# Patient Record
Sex: Female | Born: 1955 | Race: White | Hispanic: Yes | Marital: Married | Smoking: Never smoker
Health system: Southern US, Community
[De-identification: ages and names within clinical notes are randomized; demographics above are authoritative.]

## PROBLEM LIST (undated history)

## (undated) DIAGNOSIS — I10 Essential (primary) hypertension: Secondary | ICD-10-CM

## (undated) DIAGNOSIS — I639 Cerebral infarction, unspecified: Secondary | ICD-10-CM

## (undated) DIAGNOSIS — E785 Hyperlipidemia, unspecified: Secondary | ICD-10-CM

## (undated) DIAGNOSIS — C50912 Malignant neoplasm of unspecified site of left female breast: Secondary | ICD-10-CM

## (undated) DIAGNOSIS — S82142A Displaced bicondylar fracture of left tibia, initial encounter for closed fracture: Secondary | ICD-10-CM

## (undated) DIAGNOSIS — C801 Malignant (primary) neoplasm, unspecified: Secondary | ICD-10-CM

## (undated) DIAGNOSIS — E119 Type 2 diabetes mellitus without complications: Secondary | ICD-10-CM

## (undated) HISTORY — DX: Cerebral infarction, unspecified: I63.9

## (undated) HISTORY — DX: Hyperlipidemia, unspecified: E78.5

## (undated) HISTORY — DX: Malignant neoplasm of unspecified site of left female breast: C50.912

---

## 2003-09-06 ENCOUNTER — Emergency Department (HOSPITAL_COMMUNITY): Admission: EM | Admit: 2003-09-06 | Discharge: 2003-09-06 | Payer: Self-pay | Admitting: Emergency Medicine

## 2003-11-04 ENCOUNTER — Ambulatory Visit: Payer: Self-pay | Admitting: Internal Medicine

## 2005-02-14 ENCOUNTER — Emergency Department (HOSPITAL_COMMUNITY): Admission: EM | Admit: 2005-02-14 | Discharge: 2005-02-14 | Payer: Self-pay | Admitting: *Deleted

## 2005-02-15 ENCOUNTER — Ambulatory Visit: Payer: Self-pay | Admitting: Internal Medicine

## 2005-02-16 ENCOUNTER — Ambulatory Visit: Payer: Self-pay | Admitting: *Deleted

## 2005-02-16 ENCOUNTER — Emergency Department (HOSPITAL_COMMUNITY): Admission: EM | Admit: 2005-02-16 | Discharge: 2005-02-16 | Payer: Self-pay | Admitting: Emergency Medicine

## 2005-03-15 ENCOUNTER — Ambulatory Visit: Payer: Self-pay | Admitting: Internal Medicine

## 2005-04-13 ENCOUNTER — Ambulatory Visit: Payer: Self-pay | Admitting: Internal Medicine

## 2005-04-13 ENCOUNTER — Encounter: Payer: Self-pay | Admitting: Family Medicine

## 2005-05-11 ENCOUNTER — Encounter: Admission: RE | Admit: 2005-05-11 | Discharge: 2005-05-11 | Payer: Self-pay | Admitting: Family Medicine

## 2005-05-18 ENCOUNTER — Ambulatory Visit: Payer: Self-pay | Admitting: Internal Medicine

## 2008-04-22 ENCOUNTER — Emergency Department (HOSPITAL_COMMUNITY): Admission: EM | Admit: 2008-04-22 | Discharge: 2008-04-22 | Payer: Self-pay | Admitting: Emergency Medicine

## 2008-05-14 ENCOUNTER — Encounter: Admission: RE | Admit: 2008-05-14 | Discharge: 2008-05-14 | Payer: Self-pay | Admitting: Orthopedic Surgery

## 2008-06-04 ENCOUNTER — Encounter: Admission: RE | Admit: 2008-06-04 | Discharge: 2008-07-24 | Payer: Self-pay | Admitting: Orthopedic Surgery

## 2009-08-11 ENCOUNTER — Emergency Department (HOSPITAL_COMMUNITY): Admission: EM | Admit: 2009-08-11 | Discharge: 2009-08-11 | Payer: Self-pay | Admitting: Family Medicine

## 2009-12-19 ENCOUNTER — Emergency Department (HOSPITAL_COMMUNITY)
Admission: EM | Admit: 2009-12-19 | Discharge: 2009-12-19 | Payer: Self-pay | Source: Home / Self Care | Admitting: Emergency Medicine

## 2010-04-10 LAB — DIFFERENTIAL
Basophils Absolute: 0.1 10*3/uL (ref 0.0–0.1)
Basophils Relative: 1 % (ref 0–1)
Eosinophils Absolute: 0.9 10*3/uL — ABNORMAL HIGH (ref 0.0–0.7)
Eosinophils Relative: 9 % — ABNORMAL HIGH (ref 0–5)
Lymphocytes Relative: 40 % (ref 12–46)
Lymphs Abs: 4.3 10*3/uL — ABNORMAL HIGH (ref 0.7–4.0)
Monocytes Relative: 6 % (ref 3–12)
Neutro Abs: 4.7 10*3/uL (ref 1.7–7.7)

## 2010-04-10 LAB — POCT URINALYSIS DIP (DEVICE)
Ketones, ur: NEGATIVE mg/dL
Protein, ur: NEGATIVE mg/dL
Specific Gravity, Urine: <=1.005 (ref 1.005–1.030)
Urobilinogen, UA: 0.2 mg/dL (ref 0.0–1.0)

## 2010-04-10 LAB — CBC
MCHC: 34.6 g/dL (ref 30.0–36.0)
MCV: 91.7 fL (ref 78.0–100.0)

## 2010-04-10 LAB — GC/CHLAMYDIA PROBE AMP, GENITAL: GC Probe Amp, Genital: NEGATIVE

## 2010-04-10 LAB — WET PREP, GENITAL

## 2010-07-10 ENCOUNTER — Emergency Department (HOSPITAL_COMMUNITY): Payer: Self-pay

## 2010-07-10 ENCOUNTER — Inpatient Hospital Stay (INDEPENDENT_AMBULATORY_CARE_PROVIDER_SITE_OTHER)
Admission: RE | Admit: 2010-07-10 | Discharge: 2010-07-10 | Disposition: A | Discharge status: Home / Self Care | Payer: Self-pay | Source: Ambulatory Visit | Attending: Family Medicine | Admitting: Family Medicine

## 2010-07-10 ENCOUNTER — Emergency Department (HOSPITAL_COMMUNITY)
Admission: EM | Admit: 2010-07-10 | Discharge: 2010-07-10 | Disposition: A | Discharge status: Home / Self Care | Payer: Self-pay | Attending: Emergency Medicine | Admitting: Emergency Medicine

## 2010-07-10 DIAGNOSIS — I6789 Other cerebrovascular disease: Secondary | ICD-10-CM

## 2010-07-10 DIAGNOSIS — R079 Chest pain, unspecified: Secondary | ICD-10-CM | POA: Insufficient documentation

## 2010-07-10 DIAGNOSIS — I10 Essential (primary) hypertension: Secondary | ICD-10-CM | POA: Insufficient documentation

## 2010-07-10 DIAGNOSIS — R209 Unspecified disturbances of skin sensation: Secondary | ICD-10-CM | POA: Insufficient documentation

## 2010-07-10 DIAGNOSIS — M542 Cervicalgia: Secondary | ICD-10-CM | POA: Insufficient documentation

## 2010-07-10 LAB — DIFFERENTIAL
Basophils Absolute: 0 10*3/uL (ref 0.0–0.1)
Basophils Relative: 0 % (ref 0–1)
Eosinophils Absolute: 0.8 10*3/uL — ABNORMAL HIGH (ref 0.0–0.7)
Eosinophils Relative: 9 % — ABNORMAL HIGH (ref 0–5)
Lymphocytes Relative: 39 % (ref 12–46)
Lymphs Abs: 3.7 10*3/uL (ref 0.7–4.0)
Monocytes Absolute: 0.6 10*3/uL (ref 0.1–1.0)
Neutrophils Relative %: 46 % (ref 43–77)

## 2010-07-10 LAB — BASIC METABOLIC PANEL
BUN: 13 mg/dL (ref 6–23)
CO2: 27 mEq/L (ref 19–32)
Calcium: 9.4 mg/dL (ref 8.4–10.5)
Glucose, Bld: 158 mg/dL — ABNORMAL HIGH (ref 70–99)
Sodium: 137 mEq/L (ref 135–145)

## 2010-07-10 LAB — TROPONIN I: Troponin I: <0.3 ng/mL (ref <0.30)

## 2010-07-10 LAB — CBC: Hemoglobin: 15.1 g/dL — ABNORMAL HIGH (ref 12.0–15.0)

## 2012-05-03 ENCOUNTER — Emergency Department (HOSPITAL_COMMUNITY)
Admission: EM | Admit: 2012-05-03 | Discharge: 2012-05-03 | Disposition: A | Discharge status: Home / Self Care | Payer: Self-pay | Attending: Emergency Medicine | Admitting: Emergency Medicine

## 2012-05-03 ENCOUNTER — Emergency Department (HOSPITAL_COMMUNITY): Payer: Self-pay

## 2012-05-03 ENCOUNTER — Encounter (HOSPITAL_COMMUNITY): Payer: Self-pay | Admitting: Emergency Medicine

## 2012-05-03 DIAGNOSIS — B9689 Other specified bacterial agents as the cause of diseases classified elsewhere: Secondary | ICD-10-CM

## 2012-05-03 DIAGNOSIS — Z3202 Encounter for pregnancy test, result negative: Secondary | ICD-10-CM | POA: Insufficient documentation

## 2012-05-03 DIAGNOSIS — N760 Acute vaginitis: Secondary | ICD-10-CM | POA: Insufficient documentation

## 2012-05-03 DIAGNOSIS — M545 Low back pain, unspecified: Secondary | ICD-10-CM | POA: Insufficient documentation

## 2012-05-03 DIAGNOSIS — K5900 Constipation, unspecified: Secondary | ICD-10-CM | POA: Insufficient documentation

## 2012-05-03 DIAGNOSIS — R109 Unspecified abdominal pain: Secondary | ICD-10-CM | POA: Insufficient documentation

## 2012-05-03 LAB — URINALYSIS, MICROSCOPIC ONLY
Bilirubin Urine: NEGATIVE
Glucose, UA: NEGATIVE mg/dL
Hgb urine dipstick: NEGATIVE
Specific Gravity, Urine: 1.021 (ref 1.005–1.030)
Urobilinogen, UA: 0.2 mg/dL (ref 0.0–1.0)

## 2012-05-03 LAB — COMPREHENSIVE METABOLIC PANEL
AST: 30 U/L (ref 0–37)
CO2: 27 mEq/L (ref 19–32)
Calcium: 9.5 mg/dL (ref 8.4–10.5)
Chloride: 98 mEq/L (ref 96–112)
GFR calc Af Amer: >90 mL/min (ref >90)
GFR calc non Af Amer: >90 mL/min (ref >90)
Potassium: 3.7 mEq/L (ref 3.5–5.1)
Sodium: 135 mEq/L (ref 135–145)
Total Protein: 8.4 g/dL — ABNORMAL HIGH (ref 6.0–8.3)

## 2012-05-03 LAB — CBC WITH DIFFERENTIAL/PLATELET
Basophils Absolute: 0 10*3/uL (ref 0.0–0.1)
Basophils Relative: 1 % (ref 0–1)
HCT: 41.5 % (ref 36.0–46.0)
Lymphocytes Relative: 41 % (ref 12–46)
Lymphs Abs: 3.4 10*3/uL (ref 0.7–4.0)
MCH: 31.4 pg (ref 26.0–34.0)
MCV: 87 fL (ref 78.0–100.0)
Monocytes Absolute: 0.4 10*3/uL (ref 0.1–1.0)
Monocytes Relative: 5 % (ref 3–12)
Neutrophils Relative %: 43 % (ref 43–77)

## 2012-05-03 MED ORDER — METRONIDAZOLE 500 MG PO TABS: 500.0000 mg | ORAL_TABLET | Freq: Two times a day (BID) | ORAL | Status: DC

## 2012-05-03 MED ORDER — POLYETHYLENE GLYCOL 3350 17 GM/SCOOP PO POWD: 17.0000 g | Freq: Every day | ORAL | Status: DC

## 2012-05-03 NOTE — ED Notes (Signed)
Patient laying on stretcher at this time. States she is having lower abdominal pain. Plan of care discussed with patient, awaiting results from MD. Patient is in no acute distress, resp are even and unlabored. Call light at bed. Will continue to monitor.

## 2012-05-03 NOTE — ED Notes (Signed)
Patient given discharge instructions for abdominal pain, constipation and bacterial vaginosis. rx for flagyl. Advised to follow up with primary care or to return to this department if condition worsens. Patient voiced understanding. Ambulated to front lobby.

## 2012-05-03 NOTE — ED Notes (Signed)
Pt c/o lower back pain and lower abd pain x long time; pt denies bleeding; pt sts since having sx to prevent pregnancy in [place]

## 2012-05-03 NOTE — ED Provider Notes (Signed)
History     CSN: [id number]  Arrival date & time 05/03/12  1304   First MD Initiated Contact with Patient 05/03/12 1638      Chief Complaint  Patient presents with  . Back Pain  . Abdominal Pain    (Consider location/radiation/quality/duration/timing/severity/associated sxs/prior treatment) Patient is a 57 y.o. female presenting with back pain and abdominal pain. The history is provided by the patient.  Back Pain Associated symptoms: abdominal pain   Associated symptoms: no chest pain, no headaches, no numbness and no weakness   Abdominal Pain Associated symptoms: no chest pain, no diarrhea, no nausea, no shortness of breath and no vomiting    a dull lower abdominal pain since yesterday. She states is constant. Is similar to pain she 12 years ago. No nausea vomiting diarrhea. No constipation. No fevers. No vaginal bleeding or discharge. She states she's had surgery previously so she would not have any more children. She's not had pains like this recently. No decreased appetite. No dysuria. She states the pain goes through to her back.  History reviewed. No pertinent past medical history.  History reviewed. No pertinent past surgical history.  History reviewed. No pertinent family history.  History  Substance Use Topics  . Smoking status: Never Smoker   . Smokeless tobacco: Not on file  . Alcohol Use: No    OB History   Grav Para Term Preterm Abortions TAB SAB Ect Mult Living                  Review of Systems  Constitutional: Negative for activity change and appetite change.  HENT: Negative for neck stiffness.   Eyes: Negative for pain.  Respiratory: Negative for chest tightness and shortness of breath.   Cardiovascular: Negative for chest pain and leg swelling.  Gastrointestinal: Positive for abdominal pain. Negative for nausea, vomiting and diarrhea.  Genitourinary: Negative for flank pain.  Musculoskeletal: Positive for back pain.  Skin: Negative for rash.   Neurological: Negative for weakness, numbness and headaches.  Psychiatric/Behavioral: Negative for behavioral problems.    Allergies  Review of patient's allergies indicates no known allergies.  Home Medications   Current Outpatient Rx  Name  Route  Sig  Dispense  Refill  . metroNIDAZOLE (FLAGYL) 500 MG tablet   Oral   Take 1 tablet (500 mg total) by mouth 2 (two) times daily.   14 tablet   0   . polyethylene glycol powder (GLYCOLAX/MIRALAX) powder   Oral   Take 17 g by mouth daily.   255 g   0     BP 145/76  Pulse 60  Temp(Src) 98 F (36.7 C) (Oral)  Resp 16  SpO2 96%  Physical Exam  Nursing note and vitals reviewed. Constitutional: She is oriented to person, place, and time. She appears well-developed and well-nourished.  HENT:  Head: Normocephalic and atraumatic.  Eyes: EOM are normal. Pupils are equal, round, and reactive to light.  Neck: Normal range of motion. Neck supple.  Cardiovascular: Normal rate, regular rhythm and normal heart sounds.   No murmur heard. Pulmonary/Chest: Effort normal and breath sounds normal. No respiratory distress. She has no wheezes. She has no rales.  Abdominal: Soft. Bowel sounds are normal. She exhibits no distension. There is no tenderness. There is no rebound and no guarding.  Genitourinary:  No CVA tenderness  Musculoskeletal: Normal range of motion.  Neurological: She is alert and oriented to person, place, and time. No cranial nerve deficit.  Skin: Skin is  warm and dry.  Psychiatric: She has a normal mood and affect. Her speech is normal.    ED Course  Procedures (including critical care time)  Labs Reviewed  WET PREP, GENITAL - Abnormal; Notable for the following:    Clue Cells Wet Prep HPF POC MODERATE (*)    WBC, Wet Prep HPF POC MANY (*)    All other components within normal limits  COMPREHENSIVE METABOLIC PANEL - Abnormal; Notable for the following:    Glucose, Bld 206 (*)    Creatinine, Ser 0.45 (*)     Total Protein 8.4 (*)    All other components within normal limits  CBC WITH DIFFERENTIAL - Abnormal; Notable for the following:    MCHC 36.1 (*)    Eosinophils Relative 11 (*)    Eosinophils Absolute 0.9 (*)    All other components within normal limits  URINALYSIS, MICROSCOPIC ONLY - Abnormal; Notable for the following:    APPearance CLOUDY (*)    Leukocytes, UA SMALL (*)    Squamous Epithelial / LPF MANY (*)    All other components within normal limits  GC/CHLAMYDIA PROBE AMP  POCT PREGNANCY, URINE   Dg Abd 2 Views  05/03/2012  *RADIOLOGY REPORT*  Clinical Data: Abdominal pain  ABDOMEN - 2 VIEW  Comparison: None.  Findings: No free air beneath the diaphragms.  No gas filled dilated loop of bowel.  Moderate stool volume throughout nondilated colon.  No acute osseous finding.  Visualized lung bases are clear.  IMPRESSION: Nonobstructive bowel gas pattern.  Moderate stool burden.   Original Report Authenticated By: Christiana Pellant, M.D.      1. Abdominal pain   2. Constipation   3. Bacterial vaginosis       MDM  Patient with abdominal pain. Has been crampy. Bacterial vaginosis on wet prep. X-ray shows some constipation. Normal white count. Patient be discharged with treatment for constipation bacterial vaginosis. Doubt severe intra-abdominal pathology. Patient will followup in the ER in one to 2 days if pain continues.        Juliet Rude. Rubin Payor, MD 05/04/12 [phone]

## 2012-05-04 LAB — GC/CHLAMYDIA PROBE AMP: CT Probe RNA: NEGATIVE

## 2012-11-15 ENCOUNTER — Other Ambulatory Visit: Payer: Self-pay

## 2012-11-15 ENCOUNTER — Other Ambulatory Visit: Payer: Self-pay | Admitting: Internal Medicine

## 2012-11-15 DIAGNOSIS — Z1231 Encounter for screening mammogram for malignant neoplasm of breast: Secondary | ICD-10-CM

## 2012-12-18 ENCOUNTER — Ambulatory Visit
Admission: RE | Admit: 2012-12-18 | Discharge: 2012-12-18 | Disposition: A | Discharge status: Home / Self Care | Payer: Managed Care, Other (non HMO) | Source: Ambulatory Visit | Attending: Internal Medicine | Admitting: Internal Medicine

## 2012-12-18 DIAGNOSIS — Z1231 Encounter for screening mammogram for malignant neoplasm of breast: Secondary | ICD-10-CM

## 2012-12-24 ENCOUNTER — Ambulatory Visit: Payer: Self-pay | Admitting: *Deleted

## 2014-02-03 ENCOUNTER — Emergency Department (HOSPITAL_COMMUNITY)
Admission: EM | Admit: 2014-02-03 | Discharge: 2014-02-03 | Disposition: A | Discharge status: Home / Self Care | Payer: Managed Care, Other (non HMO) | Attending: Emergency Medicine | Admitting: Emergency Medicine

## 2014-02-03 ENCOUNTER — Encounter (HOSPITAL_COMMUNITY): Payer: Self-pay | Admitting: Emergency Medicine

## 2014-02-03 DIAGNOSIS — M546 Pain in thoracic spine: Secondary | ICD-10-CM | POA: Insufficient documentation

## 2014-02-03 DIAGNOSIS — I10 Essential (primary) hypertension: Secondary | ICD-10-CM | POA: Diagnosis not present

## 2014-02-03 DIAGNOSIS — R202 Paresthesia of skin: Secondary | ICD-10-CM | POA: Insufficient documentation

## 2014-02-03 DIAGNOSIS — Z79899 Other long term (current) drug therapy: Secondary | ICD-10-CM | POA: Diagnosis not present

## 2014-02-03 DIAGNOSIS — R200 Anesthesia of skin: Secondary | ICD-10-CM

## 2014-02-03 DIAGNOSIS — E119 Type 2 diabetes mellitus without complications: Secondary | ICD-10-CM | POA: Insufficient documentation

## 2014-02-03 DIAGNOSIS — Z792 Long term (current) use of antibiotics: Secondary | ICD-10-CM | POA: Diagnosis not present

## 2014-02-03 HISTORY — DX: Type 2 diabetes mellitus without complications: E11.9

## 2014-02-03 HISTORY — DX: Essential (primary) hypertension: I10

## 2014-02-03 LAB — CBC WITH DIFFERENTIAL/PLATELET
Basophils Absolute: 0 10*3/uL (ref 0.0–0.1)
Basophils Relative: 0 % (ref 0–1)
EOS ABS: 1.5 10*3/uL — AB (ref 0.0–0.7)
Eosinophils Relative: 14 % — ABNORMAL HIGH (ref 0–5)
HCT: 40.1 % (ref 36.0–46.0)
Hemoglobin: 13.9 g/dL (ref 12.0–15.0)
Lymphocytes Relative: 35 % (ref 12–46)
Lymphs Abs: 3.6 10*3/uL (ref 0.7–4.0)
MCH: 30.5 pg (ref 26.0–34.0)
MCHC: 34.7 g/dL (ref 30.0–36.0)
MCV: 87.9 fL (ref 78.0–100.0)
Monocytes Absolute: 0.6 10*3/uL (ref 0.1–1.0)
Monocytes Relative: 5 % (ref 3–12)
NEUTROS ABS: 4.6 10*3/uL (ref 1.7–7.7)
NEUTROS PCT: 46 % (ref 43–77)
PLATELETS: 229 10*3/uL (ref 150–400)
RBC: 4.56 MIL/uL (ref 3.87–5.11)
RDW: 12.4 % (ref 11.5–15.5)
WBC: 10.3 10*3/uL (ref 4.0–10.5)

## 2014-02-03 LAB — CBG MONITORING, ED: Glucose-Capillary: 113 mg/dL — ABNORMAL HIGH (ref 70–99)

## 2014-02-03 LAB — BASIC METABOLIC PANEL
ANION GAP: 11 (ref 5–15)
BUN: 11 mg/dL (ref 6–23)
CO2: 23 mmol/L (ref 19–32)
Calcium: 9 mg/dL (ref 8.4–10.5)
Chloride: 102 mEq/L (ref 96–112)
Creatinine, Ser: 0.58 mg/dL (ref 0.50–1.10)
GFR calc non Af Amer: >90 mL/min (ref >90)
Glucose, Bld: 109 mg/dL — ABNORMAL HIGH (ref 70–99)
Potassium: 3.6 mmol/L (ref 3.5–5.1)
Sodium: 136 mmol/L (ref 135–145)

## 2014-02-03 MED ORDER — KETOROLAC TROMETHAMINE 60 MG/2ML IM SOLN
60.0000 mg | Freq: Once | INTRAMUSCULAR | Status: AC
  Administered 2014-02-03: 60 mg via INTRAMUSCULAR
  Filled 2014-02-03: qty 2

## 2014-02-03 NOTE — ED Notes (Signed)
Pt complaining of left sided hand and leg numbness. Pt. Neuro and vascular assessment WDL.

## 2014-02-03 NOTE — ED Notes (Signed)
Pt a/o x 4 on d/c with family. Pt refused wheelchair.

## 2014-02-03 NOTE — ED Notes (Signed)
Pt c/o lower back pain with numbness in back and legs and arms; pt sts eye twitching x 2 weeks

## 2014-02-03 NOTE — ED Provider Notes (Signed)
CSN: [id number]     Arrival date & time 02/03/14  1344 History   First MD Initiated Contact with Patient 02/03/14 1732     Chief Complaint  Patient presents with  . Back Pain     (Consider location/radiation/quality/duration/timing/severity/associated sxs/prior Treatment) Patient is a 59 y.o. female presenting with back pain.  Back Pain Location:  Thoracic spine Quality:  Aching Pain severity:  Mild Worse during: worse with bending neck. Duration: first happened 5 yrs ago, has not happened for long time until last week. Timing:  Constant Progression:  Worsening (was getting worse prior to receiving medicine from PCP last week) Chronicity:  New Context: physical stress (works in [employer], working long hours)   Context: not recent illness and not recent injury   Relieved by: received shot in arm from PCP last week which has helped. Worsened by:  Touching and palpation Associated symptoms: numbness (left fingers, left lower leg below the knee) and paresthesias (left fingers, left leg below the knee.  "feels like my arm when a blood pressure cuff blowing up on arm"  leg feels "cold")   Associated symptoms: no abdominal pain, no bladder incontinence, no bowel incontinence, no chest pain, no dysuria, no fever and no headaches   Risk factors: no recent surgery     Past Medical History  Diagnosis Date  . Hypertension   . Diabetes mellitus without complication    History reviewed. No pertinent past surgical history. History reviewed. No pertinent family history. History  Substance Use Topics  . Smoking status: Never Smoker   . Smokeless tobacco: Not on file  . Alcohol Use: No   OB History    No data available     Review of Systems  Constitutional: Negative for fever.  HENT: Negative for sore throat.   Eyes: Negative for visual disturbance.  Respiratory: Negative for cough and shortness of breath.   Cardiovascular: Negative for chest pain.  Gastrointestinal:  Negative for nausea, vomiting, abdominal pain, constipation and bowel incontinence.  Genitourinary: Negative for bladder incontinence, dysuria and difficulty urinating.  Musculoskeletal: Positive for back pain. Negative for neck pain.  Skin: Negative for rash.  Neurological: Positive for numbness (left fingers, left lower leg below the knee) and paresthesias (left fingers, left leg below the knee.  "feels like my arm when a blood pressure cuff blowing up on arm"  leg feels "cold"). Negative for syncope and headaches.      Allergies  Review of patient's allergies indicates no known allergies.  Home Medications   Prior to Admission medications   Medication Sig Start Date End Date Taking? Authorizing Provider  metroNIDAZOLE (FLAGYL) 500 MG tablet Take 1 tablet (500 mg total) by mouth 2 (two) times daily. 05/03/12   Jasper Riling. Pickering, MD  polyethylene glycol powder (GLYCOLAX/MIRALAX) powder Take 17 g by mouth daily. 05/03/12   Jasper Riling. Pickering, MD   BP 115/51 mmHg  Pulse 57  Temp(Src) 97.6 F (36.4 C) (Oral)  Resp 14  Wt 130 lb (58.968 kg)  SpO2 96% Physical Exam  Constitutional: She is oriented to person, place, and time. She appears well-developed and well-nourished. No distress.  HENT:  Head: Normocephalic and atraumatic.  Eyes: Conjunctivae and EOM are normal.  Neck: Normal range of motion.  Cardiovascular: Normal rate, regular rhythm, normal heart sounds and intact distal pulses.  Exam reveals no gallop and no friction rub.   No murmur heard. Pulmonary/Chest: Effort normal and breath sounds normal. No respiratory distress. She has no wheezes.  She has no rales.  Abdominal: Soft. She exhibits no distension. There is no tenderness. There is no guarding.  Musculoskeletal: She exhibits no edema.       Thoracic back: She exhibits tenderness and bony tenderness.  Tenderness upper thoracic spine and left scapular area   Neurological: She is alert and oriented to person, place, and  time. She has normal strength. She displays no tremor. No cranial nerve deficit or sensory deficit. She displays a negative Romberg sign. Coordination and gait normal. GCS eye subscore is 4. GCS verbal subscore is 5. GCS motor subscore is 6.  Reports normal sensation at this time 5/5 strength upper and lower extremities  Skin: Skin is warm and dry. No rash noted. She is not diaphoretic. No erythema.  Nursing note and vitals reviewed.   ED Course  Procedures (including critical care time) Labs Review Labs Reviewed  CBC WITH DIFFERENTIAL - Abnormal; Notable for the following:    Eosinophils Relative 14 (*)    Eosinophils Absolute 1.5 (*)    All other components within normal limits  BASIC METABOLIC PANEL - Abnormal; Notable for the following:    Glucose, Bld 109 (*)    All other components within normal limits  CBG MONITORING, ED - Abnormal; Notable for the following:    Glucose-Capillary 113 (*)    All other components within normal limits    Imaging Review No results found.   EKG Interpretation None      MDM   Final diagnoses:  Numbness and tingling in left hand  Numbness and tingling of left leg   59 year old female with a history of hypertension, diabetes presents with concern of one week of intermittent upper back pain and left hand and left lower leg numbness.  Patient with normal bilateral upper and lower extremity pulses and have low suspicion for any arterial occlusion. Have low suspicion of aortic dissection given one week of intermittent symptoms, no hypertension, normal bilateral pulses and no chest pain.  Low susp for ACS given no chest pain, no SOB, and presence of both left fingers and left leg numbness. Symptoms are in the left forefingers not within a peripheral nerve or central distribution, and leg numbness is also not within a particular nerve distribution with numbness from the knee down.  Given distribution, intermittent nature, and normal neurologic exam have  low suspicion that this represents TIA or stroke.  She does describe some thoracic upper back pain, however this would not explain upper extremity symptoms, and she does not have any weakness, loss of control of bowel or bladder, no trauma, and have low suspicion for spinal emergency requiring emergent MR.  Electrolytes and CBC WNL.  Discussed results with patient and family.  Recommend close PCP follow up.  Patient discharged in stable condition with understanding of reasons to return.     Gareth Morgan, MD 02/04/14 [place] III, MD 02/04/14 1840

## 2014-02-04 NOTE — ED Provider Notes (Signed)
I saw and evaluated the patient, reviewed the resident's note and I agree with the findings and plan.   EKG Interpretation None      59 -year-old female presenting with left thoracic back pain with associated left hand and left lower extremity tingling. At time my interview, tingling had resolved and back pain had improved.  On exam, well appearing, nontoxic, not distressed, normal respiratory effort, normal perfusion, normal upper and lower extremity strength bilaterally, normal upper and lower extremity sensation bilaterally, tenderness to palpation of left mid thoracic paraspinal muscles with mild muscle spasm. Given Toradol for treatment of pain. Otherwise she appears stable for discharge home. Unlikely represent CVA given atypical history, intermittent symptoms, resolution of symptoms, and normal neurologic exam.  Clinical Impression: 1. Numbness and tingling in left hand   2. Numbness and tingling of left leg       Houston Siren III, MD 02/04/14 1840

## 2014-05-19 ENCOUNTER — Observation Stay (HOSPITAL_COMMUNITY): Payer: Managed Care, Other (non HMO)

## 2014-05-19 ENCOUNTER — Encounter (HOSPITAL_COMMUNITY): Payer: Self-pay | Admitting: *Deleted

## 2014-05-19 ENCOUNTER — Observation Stay (HOSPITAL_COMMUNITY)
Admission: EM | Admit: 2014-05-19 | Discharge: 2014-05-19 | Disposition: A | Discharge status: Home / Self Care | Payer: Managed Care, Other (non HMO) | Attending: Cardiology | Admitting: Cardiology

## 2014-05-19 ENCOUNTER — Emergency Department (HOSPITAL_COMMUNITY): Payer: Managed Care, Other (non HMO)

## 2014-05-19 DIAGNOSIS — R51 Headache: Secondary | ICD-10-CM | POA: Diagnosis not present

## 2014-05-19 DIAGNOSIS — R079 Chest pain, unspecified: Secondary | ICD-10-CM | POA: Diagnosis not present

## 2014-05-19 DIAGNOSIS — E118 Type 2 diabetes mellitus with unspecified complications: Secondary | ICD-10-CM

## 2014-05-19 DIAGNOSIS — I10 Essential (primary) hypertension: Secondary | ICD-10-CM | POA: Diagnosis not present

## 2014-05-19 DIAGNOSIS — R112 Nausea with vomiting, unspecified: Secondary | ICD-10-CM | POA: Insufficient documentation

## 2014-05-19 DIAGNOSIS — R0789 Other chest pain: Secondary | ICD-10-CM

## 2014-05-19 DIAGNOSIS — E119 Type 2 diabetes mellitus without complications: Secondary | ICD-10-CM | POA: Diagnosis not present

## 2014-05-19 DIAGNOSIS — E1169 Type 2 diabetes mellitus with other specified complication: Secondary | ICD-10-CM

## 2014-05-19 LAB — COMPREHENSIVE METABOLIC PANEL
ALT: 15 U/L (ref 0–35)
AST: 22 U/L (ref 0–37)
Albumin: 4.1 g/dL (ref 3.5–5.2)
Alkaline Phosphatase: 99 U/L (ref 39–117)
Anion gap: 12 (ref 5–15)
BUN: 11 mg/dL (ref 6–23)
CO2: 26 mmol/L (ref 19–32)
Calcium: 9.6 mg/dL (ref 8.4–10.5)
Chloride: 99 mmol/L (ref 96–112)
Creatinine, Ser: 0.59 mg/dL (ref 0.50–1.10)
Glucose, Bld: 138 mg/dL — ABNORMAL HIGH (ref 70–99)
Potassium: 3.7 mmol/L (ref 3.5–5.1)
SODIUM: 137 mmol/L (ref 135–145)
Total Bilirubin: 0.5 mg/dL (ref 0.3–1.2)
Total Protein: 8.1 g/dL (ref 6.0–8.3)

## 2014-05-19 LAB — CBC
HEMATOCRIT: 41.1 % (ref 36.0–46.0)
HEMATOCRIT: 41.3 % (ref 36.0–46.0)
Hemoglobin: 14.2 g/dL (ref 12.0–15.0)
Hemoglobin: 14 g/dL (ref 12.0–15.0)
MCH: 31.1 pg (ref 26.0–34.0)
MCH: 30.6 pg (ref 26.0–34.0)
MCHC: 34.5 g/dL (ref 30.0–36.0)
MCHC: 33.9 g/dL (ref 30.0–36.0)
MCV: 90.1 fL (ref 78.0–100.0)
MCV: 90.2 fL (ref 78.0–100.0)
Platelets: 231 10*3/uL (ref 150–400)
Platelets: 220 10*3/uL (ref 150–400)
RBC: 4.56 MIL/uL (ref 3.87–5.11)
RBC: 4.58 MIL/uL (ref 3.87–5.11)
RDW: 13.4 % (ref 11.5–15.5)
RDW: 13.4 % (ref 11.5–15.5)
WBC: 11.5 10*3/uL — ABNORMAL HIGH (ref 4.0–10.5)
WBC: 9.3 10*3/uL (ref 4.0–10.5)

## 2014-05-19 LAB — CREATININE, SERUM
CREATININE: 0.49 mg/dL — AB (ref 0.50–1.10)
GFR calc Af Amer: >90 mL/min (ref >90)
GFR calc non Af Amer: >90 mL/min (ref >90)

## 2014-05-19 LAB — LIPID PANEL
CHOL/HDL RATIO: 4.6 ratio
Cholesterol: 190 mg/dL (ref 0–200)
HDL: 41 mg/dL (ref >39)
LDL Cholesterol: 95 mg/dL (ref 0–99)
Triglycerides: 270 mg/dL — ABNORMAL HIGH (ref <150)
VLDL: 54 mg/dL — AB (ref 0–40)

## 2014-05-19 LAB — TROPONIN I: Troponin I: <0.03 ng/mL (ref <0.031)

## 2014-05-19 LAB — GLUCOSE, CAPILLARY
GLUCOSE-CAPILLARY: 128 mg/dL — AB (ref 70–99)
Glucose-Capillary: 129 mg/dL — ABNORMAL HIGH (ref 70–99)
Glucose-Capillary: 119 mg/dL — ABNORMAL HIGH (ref 70–99)

## 2014-05-19 LAB — LIPASE, BLOOD: Lipase: 43 U/L (ref 11–59)

## 2014-05-19 LAB — TSH: TSH: 1.319 u[IU]/mL (ref 0.350–4.500)

## 2014-05-19 LAB — I-STAT TROPONIN, ED: Troponin i, poc: 0 ng/mL (ref 0.00–0.08)

## 2014-05-19 MED ORDER — ACETAMINOPHEN 325 MG PO TABS: 650.0000 mg | ORAL_TABLET | ORAL | Status: DC | PRN

## 2014-05-19 MED ORDER — ASPIRIN 81 MG PO TBEC: 81.0000 mg | DELAYED_RELEASE_TABLET | Freq: Every day | ORAL | Status: DC

## 2014-05-19 MED ORDER — TECHNETIUM TC 99M SESTAMIBI GENERIC - CARDIOLITE: 10.0000 | Freq: Once | INTRAVENOUS | Status: DC | PRN

## 2014-05-19 MED ORDER — ONDANSETRON HCL 4 MG/2ML IJ SOLN: 4.0000 mg | Freq: Four times a day (QID) | INTRAMUSCULAR | Status: DC | PRN

## 2014-05-19 MED ORDER — ATORVASTATIN CALCIUM 10 MG PO TABS: 10.0000 mg | ORAL_TABLET | Freq: Every day | ORAL | Status: DC

## 2014-05-19 MED ORDER — LISINOPRIL 10 MG PO TABS
10.0000 mg | ORAL_TABLET | Freq: Every day | ORAL | Status: DC
  Administered 2014-05-19: 10 mg via ORAL
  Filled 2014-05-19: qty 1

## 2014-05-19 MED ORDER — FAMOTIDINE 20 MG PO TABS: 20.0000 mg | ORAL_TABLET | Freq: Two times a day (BID) | ORAL | Status: DC

## 2014-05-19 MED ORDER — ASPIRIN EC 81 MG PO TBEC
81.0000 mg | DELAYED_RELEASE_TABLET | Freq: Every day | ORAL | Status: DC
  Administered 2014-05-19: 81 mg via ORAL
  Filled 2014-05-19: qty 1

## 2014-05-19 MED ORDER — INSULIN ASPART 100 UNIT/ML ~~LOC~~ SOLN
0.0000 [IU] | Freq: Three times a day (TID) | SUBCUTANEOUS | Status: DC
  Administered 2014-05-19: 1 [IU] via SUBCUTANEOUS

## 2014-05-19 MED ORDER — ASPIRIN 81 MG PO CHEW
324.0000 mg | CHEWABLE_TABLET | Freq: Once | ORAL | Status: AC
  Administered 2014-05-19: 243 mg via ORAL
  Filled 2014-05-19: qty 4

## 2014-05-19 MED ORDER — ATORVASTATIN CALCIUM 80 MG PO TABS: 80.0000 mg | ORAL_TABLET | Freq: Every day | ORAL | Status: DC

## 2014-05-19 MED ORDER — ENOXAPARIN SODIUM 40 MG/0.4ML ~~LOC~~ SOLN
40.0000 mg | SUBCUTANEOUS | Status: DC
  Administered 2014-05-19: 40 mg via SUBCUTANEOUS
  Filled 2014-05-19: qty 0.4

## 2014-05-19 MED ORDER — GI COCKTAIL ~~LOC~~: 30.0000 mL | Freq: Four times a day (QID) | ORAL | Status: DC | PRN

## 2014-05-19 MED ORDER — TECHNETIUM TC 99M SESTAMIBI GENERIC - CARDIOLITE
10.0000 | Freq: Once | INTRAVENOUS | Status: AC | PRN
  Administered 2014-05-19: 10 via INTRAVENOUS

## 2014-05-19 MED ORDER — TECHNETIUM TC 99M SESTAMIBI GENERIC - CARDIOLITE
30.0000 | Freq: Once | INTRAVENOUS | Status: AC | PRN
  Administered 2014-05-19: 30 via INTRAVENOUS

## 2014-05-19 NOTE — H&P (Signed)
Patient ID: Amanda Duarte MRN: [id number], DOB/AGE: 59-Jul-1957   Admit date: 05/19/2014   Primary Physician: No primary care provider on file. Primary Cardiologist: None  Pt. Profile:  82F with DM and HTN who presents with HA and left sided chest pain.    Problem List  Past Medical History  Diagnosis Date  . Hypertension   . Diabetes mellitus without complication     History reviewed. No pertinent past surgical history.   Allergies  No Known Allergies  HPI  82F with DM and HTN who presents with HA and left sided chest pain.  Ms. Payden Bonus states that earlier this evening after leaving work she developed a HA. She took a shower in hopes of feeling better. At around [place], she developed a "hard, pressure" sensation in her left chest. She reported an associated movement of her left hand where it seemed to turn "by itself". She had associated SOB. No other associated symptoms. No modifying factors including inspiration or palpation. She took a baby aspirin which helped with the HA but did not seem to relieve the CP.   She applied some sort of alcohol based solution to her hands and feet which made her feel better. The CP resolved after about 30 minutes.  On arrival to the ER, she was hypertensive to 190/56. HR 54, 100% on RA.  CP free. Labs were notable for K 3.7, Cr 0.59, POC TnI 0.00. CXR was without acute cardiopulmonary process. ECG demonstrated NSR. Sub mm scooped STD in interior and apical leads. She was given 324mg  PO aspirin.   The patient is a non-smoker without any history of CV disease, prior stress, or cath. She has not seen a cardiologist. She is seen by a PCP at [facility]. She does not know the name of the physician or the names of her medications (a antihypertensive and an oral hypoglycemic agent). She never had the CP symptoms before today.   Home Medications  Prior to Admission medications   Medication Sig Start Date End Date Taking? Authorizing Provider    PRESCRIPTION MEDICATION Take 1 tablet by mouth daily. For high blood pressure. Sample from MD. Unknown name/strength.   Yes Historical Provider, MD  PRESCRIPTION MEDICATION Take 1 tablet by mouth daily. For diabetes. Sample from MD. Unknown name/strength.   Yes Historical Provider, MD  metroNIDAZOLE (FLAGYL) 500 MG tablet Take 1 tablet (500 mg total) by mouth 2 (two) times daily. Patient not taking: Reported on 05/19/2014 05/03/12   Davonna Belling, MD  polyethylene glycol powder (GLYCOLAX/MIRALAX) powder Take 17 g by mouth daily. Patient not taking: Reported on 05/19/2014 05/03/12   Davonna Belling, MD    Family History  No family history on file.  Social History  History   Social History  . Marital Status: Married    Spouse Name: N/A  . Number of Children: N/A  . Years of Education: N/A   Occupational History  . Not on file.   Social History Main Topics  . Smoking status: Never Smoker   . Smokeless tobacco: Not on file  . Alcohol Use: No  . Drug Use: No  . Sexual Activity: Not on file   Other Topics Concern  . Not on file   Social History Narrative     Review of Systems General:  No chills, fever, night sweats or weight changes.  Cardiovascular: See HPI Dermatological: No rash, lesions/masses Respiratory: No cough, dyspnea Urologic: No hematuria, dysuria Abdominal:   No nausea, vomiting, diarrhea Neurologic:  No visual changes, wkns, changes in mental status. All other systems reviewed and are otherwise negative except as noted above.  Physical Exam  Blood pressure 160/63, pulse 55, temperature 97.5 F (36.4 C), temperature source Oral, resp. rate 12, weight 60.011 kg (132 lb 4.8 oz), SpO2 99 %.  General: Pleasant, NAD Psych: Normal affect. Neuro: Alert and oriented X 3. Moves all extremities spontaneously. HEENT: Normal  Neck: Supple without bruits or JVD. Lungs:  Resp regular and unlabored, CTA. Heart: RRR no s3, s4, or murmurs. Abdomen: Soft,  non-tender, non-distended, BS + x 4.  Extremities: No clubbing, cyanosis or edema. DP/PT/Radials 2+ and equal bilaterally.  Labs  Troponin [facility] of Care Test)  Recent Labs  05/19/14 0044  TROPIPOC 0.00   No results for input(s): CKTOTAL, CKMB, TROPONINI in the last 72 hours. Lab Results  Component Value Date   WBC 11.5* 05/19/2014   HGB 14.2 05/19/2014   HCT 41.1 05/19/2014   MCV 90.1 05/19/2014   PLT 231 05/19/2014     Recent Labs Lab 05/19/14 0039  NA 137  K 3.7  CL 99  CO2 26  BUN 11  CREATININE 0.59  CALCIUM 9.6  PROT 8.1  BILITOT 0.5  ALKPHOS 99  ALT 15  AST 22  GLUCOSE 138*   No results found for: CHOL, HDL, LDLCALC, TRIG No results found for: DDIMER   Radiology/Studies  Dg Chest Port 1 View  05/19/2014   CLINICAL DATA:  Acute onset of centralized chest pain. Initial encounter.  EXAM: PORTABLE CHEST - 1 VIEW  COMPARISON:  Chest radiograph performed 07/10/2010  FINDINGS: The lungs are well-aerated. Minimal right basilar atelectasis is noted. There is no evidence of pleural effusion or pneumothorax.  The cardiomediastinal silhouette is borderline normal in size. No acute osseous abnormalities are seen.  IMPRESSION: Minimal right basilar atelectasis noted.  Lungs otherwise clear.   Electronically Signed   By: Garald Balding M.D.   On: 05/19/2014 02:59    ECG  05/19/14 NSR. Sub mm scooped STD in interior and apical leads   ASSESSMENT AND PLAN  6F with DM and HTN who presents with HA and left sided chest pain. It is difficult to know exactly what to make of these symptoms although she certainly has risk factors (DM, HTN) for epicardial coronary disease. She was very hypertensive on arrival and very poorly controlled HTN could potentially explain both CP and HA.  She feels well now. The ECG demonstrates atypical, scooped ST depression that are more reminiscent of digoxin use than acute ischemia. Will plan to admit to cycle troponins, monitor BP, and likely  perform stress test.   1. Admit to observation 2. Will start lisinopril 10mg  daily; she should be on an ACE based on her DM anyways 3. Will start atorva 80mg  and ASA 81mg ; she should be on both based on her DM 4. NPO for probable stress 5. DM: unknown oral hypogylcemic agent at home check A1c, AC and HS FS, ISS 6. Check lipids 7. Will need to call pharmacy in AM to clarify home meds.  Signed, Lamar Sprinkles, MD 05/19/2014, 4:09 AM

## 2014-05-19 NOTE — Progress Notes (Signed)
Patient Name: Amanda Duarte Date of Encounter: 05/19/2014   SUBJECTIVE  Denies CP, SOB or HA.   CURRENT MEDS . aspirin EC  81 mg Oral Daily  . atorvastatin  80 mg Oral q1800  . enoxaparin (LOVENOX) injection  40 mg Subcutaneous Q24H  . insulin aspart  0-9 Units Subcutaneous TID WC  . lisinopril  10 mg Oral Daily    OBJECTIVE  Filed Vitals:   05/19/14 0349 05/19/14 0451 05/19/14 0807 05/19/14 0847  BP: 160/63 147/49 134/60 126/57  Pulse: 55 50  52  Temp:  97.5 F (36.4 C)  98 F (36.7 C)  TempSrc:  Oral  Oral  Resp: 12     Height:  5' (1.524 m)    Weight:  128 lb 7 oz (58.259 kg)    SpO2: 99% 99%  99%   No intake or output data in the 24 hours ending 05/19/14 0932 Filed Weights   05/19/14 0037 05/19/14 0451  Weight: 132 lb 4.8 oz (60.011 kg) 128 lb 7 oz (58.259 kg)    PHYSICAL EXAM  General: Pleasant, NAD. Neuro: Alert and oriented X 3. Moves all extremities spontaneously. Psych: Normal affect. HEENT:  Normal  Neck: Supple without bruits or JVD. Lungs:  Resp regular and unlabored, CTA. Heart: RRR no s3, s4, or murmurs. Abdomen: Soft, non-tender, non-distended, BS + x 4.  Extremities: No clubbing, cyanosis or edema. DP/PT/Radials 2+ and equal bilaterally.  Accessory Clinical Findings  CBC  Recent Labs  05/19/14 0039 05/19/14 0625  WBC 11.5* 9.3  HGB 14.2 14.0  HCT 41.1 41.3  MCV 90.1 90.2  PLT 231 161   Basic Metabolic Panel  Recent Labs  05/19/14 0039 05/19/14 0625  NA 137  --   K 3.7  --   CL 99  --   CO2 26  --   GLUCOSE 138*  --   BUN 11  --   CREATININE 0.59 0.49*  CALCIUM 9.6  --    Liver Function Tests  Recent Labs  05/19/14 0039  AST 22  ALT 15  ALKPHOS 99  BILITOT 0.5  PROT 8.1  ALBUMIN 4.1    Recent Labs  05/19/14 0039  LIPASE 43   Cardiac Enzymes  Recent Labs  05/19/14 0625  TROPONINI <0.03    Fasting Lipid Panel  Recent Labs  05/19/14 0625  CHOL 190  HDL 41  LDLCALC 95  TRIG 270*    CHOLHDL 4.6   TELE  Sinus Bradycardia HR in 50s. Occasional in 39s.   Radiology/Studies  Dg Chest Port 1 View  05/19/2014   CLINICAL DATA:  Acute onset of centralized chest pain. Initial encounter.  EXAM: PORTABLE CHEST - 1 VIEW  COMPARISON:  Chest radiograph performed 07/10/2010  FINDINGS: The lungs are well-aerated. Minimal right basilar atelectasis is noted. There is no evidence of pleural effusion or pneumothorax.  The cardiomediastinal silhouette is borderline normal in size. No acute osseous abnormalities are seen.  IMPRESSION: Minimal right basilar atelectasis noted.  Lungs otherwise clear.   Electronically Signed   By: Garald Balding M.D.   On: 05/19/2014 02:59    ASSESSMENT AND PLAN Principal Problem:   Chest pain Active Problems:   HTN (hypertension)   DM (diabetes mellitus)   Amanda Duarte is a 59 y.o. female with a PMHx of HTN and DM who presents to the Emergency Department 4/25  complaining of L sided chest pressure and HA.   In ER, she was hypertensive to 190/56. HR 54.  CXR was without acute cardiopulmonary process. ECG demonstrated NSR. Sub mm scooped STD in interior and apical leads. She was given 324mg  PO aspirin.  1. Chest pain - Atypical in nature. Symptoms are now resolved. Trop x 2 negative. LDL 95, Triglyceride 270. HgbA1C pending. ST depression in inferior leads? No prior EKG for comparison.  BP is controlled now with HR in 50s. - She was started on Lisinopril 10mg  daily, Atorvastatin 80mg , ASA81mg  and Lovenox on admission. Unknown home meds. Continue current medical therapy. Continue to monitor cycle trop and blood pressure. She is NPO. MD to decide stress test. Risk factor for coronary disease includes HTN and DM. Non smoker. Mom had a CAD in her 110s, she could not provide detailed hx.   Jarrett Soho PA-C Pager [phone]   Patient seen and examined. Agree with assessment and plan. No further chest pain. Pt denies prior exercise induced  chest pressure.  Scalloping of T waves in inferior leads on ECG. Pt is NPO so far. Will try to set up for exercise myoview today if schedules allows.   Troy Sine, MD, [facility] 05/19/2014 11:15 AM

## 2014-05-19 NOTE — Consult Note (Deleted)
Patient ID: Amanda Duarte MRN: [id number], DOB/AGE: 03-31-1955   Admit date: 05/19/2014   Primary Physician: No primary care provider on file. Primary Cardiologist: None  Pt. Profile:  59F with DM and HTN who presents with HA and left sided chest pain.    Problem List  Past Medical History  Diagnosis Date  . Hypertension   . Diabetes mellitus without complication     History reviewed. No pertinent past surgical history.   Allergies  No Known Allergies  HPI  59F with DM and HTN who presents with HA and left sided chest pain.  Ms. Amanda Duarte states that earlier this evening after leaving work she developed a HA. She took a shower in hopes of feeling better. At around [place], she developed a "hard, pressure" sensation in her left chest. She reported an associated movement of her left hand where it seemed to turn "by itself". She had associated SOB. No other associated symptoms. No modifying factors including inspiration or palpation. She took a baby aspirin which helped with the HA but did not seem to relieve the CP.   She applied some sort of alcohol based solution to her hands and feet which made her feel better. The CP resolved after about 30 minutes.  On arrival to the ER, she was hypertensive to 190/56. HR 54, 100% on RA.  CP free. Labs were notable for K 3.7, Cr 0.59, POC TnI 0.00. CXR was without acute cardiopulmonary process. ECG demonstrated NSR. Sub mm scooped STD in interior and apical leads. She was given 324mg  PO aspirin.   The patient is a non-smoker without any history of CV disease, prior stress, or cath. She has not seen a cardiologist. She is seen by a PCP at [facility]. She does not know the name of the physician or the names of her medications (a antihypertensive and an oral hypoglycemic agent). She never had the CP symptoms before today.   Home Medications  Prior to Admission medications   Medication Sig Start Date End Date Taking? Authorizing Provider    PRESCRIPTION MEDICATION Take 1 tablet by mouth daily. For high blood pressure. Sample from MD. Unknown name/strength.   Yes Historical Provider, MD  PRESCRIPTION MEDICATION Take 1 tablet by mouth daily. For diabetes. Sample from MD. Unknown name/strength.   Yes Historical Provider, MD  metroNIDAZOLE (FLAGYL) 500 MG tablet Take 1 tablet (500 mg total) by mouth 2 (two) times daily. Patient not taking: Reported on 05/19/2014 05/03/12   Davonna Belling, MD  polyethylene glycol powder (GLYCOLAX/MIRALAX) powder Take 17 g by mouth daily. Patient not taking: Reported on 05/19/2014 05/03/12   Davonna Belling, MD    Family History  No family history on file.  Social History  History   Social History  . Marital Status: Married    Spouse Name: N/A  . Number of Children: N/A  . Years of Education: N/A   Occupational History  . Not on file.   Social History Main Topics  . Smoking status: Never Smoker   . Smokeless tobacco: Not on file  . Alcohol Use: No  . Drug Use: No  . Sexual Activity: Not on file   Other Topics Concern  . Not on file   Social History Narrative     Review of Systems General:  No chills, fever, night sweats or weight changes.  Cardiovascular: See HPI Dermatological: No rash, lesions/masses Respiratory: No cough, dyspnea Urologic: No hematuria, dysuria Abdominal:   No nausea, vomiting, diarrhea Neurologic:  No visual changes, wkns, changes in mental status. All other systems reviewed and are otherwise negative except as noted above.  Physical Exam  Blood pressure 130/50, pulse 52, temperature 97.5 F (36.4 C), temperature source Oral, resp. rate 12, weight 60.011 kg (132 lb 4.8 oz), SpO2 99 %.  General: Pleasant, NAD Psych: Normal affect. Neuro: Alert and oriented X 3. Moves all extremities spontaneously. HEENT: Normal  Neck: Supple without bruits or JVD. Lungs:  Resp regular and unlabored, CTA. Heart: RRR no s3, s4, or murmurs. Abdomen: Soft,  non-tender, non-distended, BS + x 4.  Extremities: No clubbing, cyanosis or edema. DP/PT/Radials 2+ and equal bilaterally.  Labs  Troponin [facility] of Care Test)  Recent Labs  05/19/14 0044  TROPIPOC 0.00   No results for input(s): CKTOTAL, CKMB, TROPONINI in the last 72 hours. Lab Results  Component Value Date   WBC 11.5* 05/19/2014   HGB 14.2 05/19/2014   HCT 41.1 05/19/2014   MCV 90.1 05/19/2014   PLT 231 05/19/2014    Recent Labs Lab 05/19/14 0039  NA 137  K 3.7  CL 99  CO2 26  BUN 11  CREATININE 0.59  CALCIUM 9.6  PROT 8.1  BILITOT 0.5  ALKPHOS 99  ALT 15  AST 22  GLUCOSE 138*   No results found for: CHOL, HDL, LDLCALC, TRIG No results found for: DDIMER   Radiology/Studies  Dg Chest Port 1 View  05/19/2014   CLINICAL DATA:  Acute onset of centralized chest pain. Initial encounter.  EXAM: PORTABLE CHEST - 1 VIEW  COMPARISON:  Chest radiograph performed 07/10/2010  FINDINGS: The lungs are well-aerated. Minimal right basilar atelectasis is noted. There is no evidence of pleural effusion or pneumothorax.  The cardiomediastinal silhouette is borderline normal in size. No acute osseous abnormalities are seen.  IMPRESSION: Minimal right basilar atelectasis noted.  Lungs otherwise clear.   Electronically Signed   By: Garald Balding M.D.   On: 05/19/2014 02:59    ECG  05/19/14 NSR. Sub mm scooped STD in interior and apical leads   ASSESSMENT AND PLAN  59F with DM and HTN who presents with HA and left sided chest pain. It is difficult to know exactly what to make of these symptoms although she certainly has risk factors (DM, HTN) for epicardial coronary disease. She was very hypertensive on arrival and very poorly controlled HTN could potentially explain both CP and HA.  She feels well now. The ECG demonstrates atypical, scooped ST depression that are more reminiscent of digoxin use than acute ischemia. Will plan to admit to cycle troponins, monitor BP, and likely  perform stress test.   1. Admit to observation 2. Will start lisinopril 10mg  daily; she should be on an ACE based on her DM anyways 3. Will start atorva 80mg  and ASA 81mg ; she should be on both based on her DM 4. NPO for probable stress 5. DM: unknown oral hypogylcemic agent at home check A1c, AC and HS FS, ISS 6. Check lipids 7. Will need to call pharmacy in AM to clarify home meds.  Signed, Lamar Sprinkles, MD 05/19/2014, 3:18 AM

## 2014-05-19 NOTE — Progress Notes (Signed)
Pt completed 1 min into stage 3.  She reached target HR at 5:50 of 85% pred. Max.  No chest pain.  nuc results to follow.

## 2014-05-19 NOTE — Discharge Summary (Signed)
Physician Discharge Summary       Patient ID: Thurley Francesconi MRN: [id number] DOB/AGE: 59/13/1957 59 y.o.  Admit date: 05/19/2014 Discharge date: 05/19/2014 Primary Cardiologist:Dr. Claiborne Billings   Discharge Diagnoses:  Principal Problem:   Chest pain, neg MI, neg stress Nuc study, possible GI Active Problems:   HTN (hypertension)now controlled   DM (diabetes mellitus)   Discharged Condition: good  Procedures: none  Hospital Course: 68F with DM and HTN who presents with HA and left sided chest pain. Ms. Solae Norling states that earlier this evening after leaving work she developed a HA. She took a shower in hopes of feeling better. At around [place], she developed a "hard, pressure" sensation in her left chest. She reported an associated movement of her left hand where it seemed to turn "by itself". She had associated SOB. No other associated symptoms. No modifying factors including inspiration or palpation. She took a baby aspirin which helped with the HA but did not seem to relieve the CP. She applied some sort of alcohol based solution to her hands and feet which made her feel better. The CP resolved after about 30 minutes.  hypertensive to 190/56. HR 54, 100% on RA. CP free. Labs were notable for K 3.7, Cr 0.59, POC TnI 0.00. CXR was without acute cardiopulmonary process. ECG demonstrated NSR. Sub mm scooped STD in interior and apical leads. She was given 324mg  PO aspirin.   The patient is a non-smoker without any history of CV disease, prior stress, or cath. She has not seen a cardiologist. She is seen by a PCP at [facility]. She does not know the name of the physician or the names of her medications (a antihypertensive and an oral hypoglycemic agent). She never had the CP symptoms before today.   She was admitted and troponins were negative.  She had a stress test that was negative for ischemia on nuclear portion.  Not felt to be cardiac pain will add pepcid.  She will follow up with her  PCP next week. She cannot remember his name now.    Consults: cardiology  Significant Diagnostic Studies:   BMET    Component Value Date/Time   NA 137 05/19/2014 0039   K 3.7 05/19/2014 0039   CL 99 05/19/2014 0039   CO2 26 05/19/2014 0039   GLUCOSE 138* 05/19/2014 0039   BUN 11 05/19/2014 0039   CREATININE 0.49* 05/19/2014 0625   CALCIUM 9.6 05/19/2014 0039   GFRNONAA >90 05/19/2014 0625   GFRAA >90 05/19/2014 0625    CBC    Component Value Date/Time   WBC 9.3 05/19/2014 0625   RBC 4.58 05/19/2014 0625   HGB 14.0 05/19/2014 0625   HCT 41.3 05/19/2014 0625   PLT 220 05/19/2014 0625   MCV 90.2 05/19/2014 0625   MCH 30.6 05/19/2014 0625   MCHC 33.9 05/19/2014 0625   RDW 13.4 05/19/2014 0625   LYMPHSABS 3.6 02/03/2014 1854   MONOABS 0.6 02/03/2014 1854   EOSABS 1.5* 02/03/2014 1854   BASOSABS 0.0 02/03/2014 1854    Lipid Panel     Component Value Date/Time   CHOL 190 05/19/2014 0625   TRIG 270* 05/19/2014 0625   HDL 41 05/19/2014 0625   CHOLHDL 4.6 05/19/2014 0625   VLDL 54* 05/19/2014 0625   LDLCALC 95 05/19/2014 0625   Exercise myoview IMPRESSION: 1. No evidence of myocardial infarction or inducible myocardial ischemia with treadmill exercise.  2. Normal left ventricular wall motion.  3. Left ventricular ejection fraction 73%  4. Low-risk stress   PORTABLE CHEST - 1 VIEW COMPARISON: Chest radiograph performed 07/10/2010 FINDINGS: The lungs are well-aerated. Minimal right basilar atelectasis is noted. There is no evidence of pleural effusion or pneumothorax. The cardiomediastinal silhouette is borderline normal in size. No acute osseous abnormalities are seen. IMPRESSION: Minimal right basilar atelectasis noted. Lungs otherwise clear.  Discharge Exam: Blood pressure 102/43, pulse 69, temperature 98 F (36.7 C), temperature source Oral, resp. rate 12, height 5' (1.524 m), weight 128 lb 7 oz (58.259 kg), SpO2 95 %.  Disposition: 01-Home  or Self Care     Medication List    TAKE these medications        aspirin 81 MG EC tablet  Take 1 tablet (81 mg total) by mouth daily.     atorvastatin 10 MG tablet  Commonly known as:  LIPITOR  Take 1 tablet (10 mg total) by mouth daily at 6 PM.     PRESCRIPTION MEDICATION  Take 1 tablet by mouth daily. For high blood pressure. Sample from MD. Unknown name/strength.     PRESCRIPTION MEDICATION  Take 1 tablet by mouth daily. For diabetes. Sample from MD. Unknown name/strength.          Discharge Instructions:Follow up with your primary doctor.  Continue your home medications.  Heart healthy diabetic diet   Signed: [email or web address],[id number] R Nurse Practitioner-Certified [facility]: [facility] 05/19/2014, 5:18 PM  Time spent on discharge :  30 minutes.

## 2014-05-19 NOTE — Progress Notes (Signed)
nuc study without ischemia.  BP controlled and negative troponin.  Decrease statin Vs. Stop.  Continue lisinopril.  Will check with MD.

## 2014-05-19 NOTE — Progress Notes (Signed)
UR completed 

## 2014-05-19 NOTE — ED Provider Notes (Signed)
CSN: [id number]     Arrival date & time 05/19/14  0031 History   First MD Initiated Contact with Patient 05/19/14 0045     This chart was scribed for Lamar Sprinkles, MD by Forrestine Him, ED Scribe. This patient was seen in room 3W24C/3W24C-01 and the patient's care was started 12:50 AM.   Chief Complaint  Patient presents with  . Chest Pain   HPI  HPI Comments: Amanda Duarte is a 59 y.o. female with a PMHx of HTN and DM who presents to the Emergency Department complaining of  L sided chest pain x 1 hour while at rest. She is now pain free. Pt also reports 1 episode of vomiting prior to arrival. 1 dose of baby Aspirin taken this evening. Last full meal at 6 PM this evening. She denies any history of heart issues. Amanda Duarte was recently evaluated by a cardiologist 15 days ago. During office visit, blood pressure was checked. No history of cardiac stress testing. No known allergies to medications.  Past Medical History  Diagnosis Date  . Hypertension   . Diabetes mellitus without complication    History reviewed. No pertinent past surgical history. No family history on file. History  Substance Use Topics  . Smoking status: Never Smoker   . Smokeless tobacco: Not on file  . Alcohol Use: No   OB History    No data available     Review of Systems  Constitutional: Negative for fever and chills.  Respiratory: Negative for cough and shortness of breath.   Cardiovascular: Positive for chest pain. Negative for palpitations and leg swelling.  Gastrointestinal: Positive for nausea and vomiting. Negative for abdominal pain.  Musculoskeletal: Negative for myalgias, back pain, neck pain and neck stiffness.  Skin: Negative for rash.  Neurological: Positive for headaches. Negative for dizziness, weakness and numbness.  Psychiatric/Behavioral: Negative for confusion.  All other systems reviewed and are negative.     Allergies  Review of patient's allergies indicates no known  allergies.  Home Medications   Prior to Admission medications   Medication Sig Start Date End Date Taking? Authorizing Provider  PRESCRIPTION MEDICATION Take 1 tablet by mouth daily. For high blood pressure. Sample from MD. Unknown name/strength.   Yes Historical Provider, MD  PRESCRIPTION MEDICATION Take 1 tablet by mouth daily. For diabetes. Sample from MD. Unknown name/strength.   Yes Historical Provider, MD   Triage Vitals: BP 147/49 mmHg  Pulse 50  Temp(Src) 97.5 F (36.4 C) (Oral)  Resp 12  Ht 5' (1.524 m)  Wt 128 lb 7 oz (58.259 kg)  BMI 25.08 kg/m2  SpO2 99%   Physical Exam  Constitutional: She is oriented to person, place, and time. She appears well-developed and well-nourished. No distress.  HENT:  Head: Normocephalic and atraumatic.  Mouth/Throat: Oropharynx is clear and moist.  Eyes: EOM are normal. Pupils are equal, round, and reactive to light.  Neck: Normal range of motion. Neck supple.  Cardiovascular: Normal rate and regular rhythm.   Pulmonary/Chest: Effort normal and breath sounds normal. No respiratory distress. She has no wheezes. She has no rales. She exhibits no tenderness.  Abdominal: Soft. Bowel sounds are normal. She exhibits no distension and no mass. There is no tenderness. There is no rebound and no guarding.  Musculoskeletal: Normal range of motion. She exhibits no edema or tenderness.  No calf swelling or tenderness.  Neurological: She is alert and oriented to person, place, and time.  5/5 motor in all these. Sensation is fully  intact.  Skin: Skin is warm and dry. No rash noted. No erythema.  Psychiatric: She has a normal mood and affect. Her behavior is normal.  Nursing note and vitals reviewed.   ED Course  Procedures (including critical care time)  DIAGNOSTIC STUDIES: Oxygen Saturation is 100% on RA, Normal by my interpretation.    COORDINATION OF CARE: 6:00 AM-Discussed treatment plan with pt at bedside and pt agreed to plan.        Labs Review Labs Reviewed  CBC - Abnormal; Notable for the following:    WBC 11.5 (*)    All other components within normal limits  COMPREHENSIVE METABOLIC PANEL - Abnormal; Notable for the following:    Glucose, Bld 138 (*)    All other components within normal limits  LIPASE, BLOOD  TROPONIN I  TROPONIN I  TROPONIN I  CBC  CREATININE, SERUM  HEMOGLOBIN A1C  LIPID PANEL  I-STAT TROPOININ, ED    Imaging Review Dg Chest Port 1 View  05/19/2014   CLINICAL DATA:  Acute onset of centralized chest pain. Initial encounter.  EXAM: PORTABLE CHEST - 1 VIEW  COMPARISON:  Chest radiograph performed 07/10/2010  FINDINGS: The lungs are well-aerated. Minimal right basilar atelectasis is noted. There is no evidence of pleural effusion or pneumothorax.  The cardiomediastinal silhouette is borderline normal in size. No acute osseous abnormalities are seen.  IMPRESSION: Minimal right basilar atelectasis noted.  Lungs otherwise clear.   Electronically Signed   By: Garald Balding M.D.   On: 05/19/2014 02:59     EKG Interpretation   Date/Time:  Monday May 19 2014 00:36:00 EDT Ventricular Rate:  62 PR Interval:  154 QRS Duration: 74 QT Interval:  420 QTC Calculation: 426 R Axis:   72 Text Interpretation:  Normal sinus rhythm ST \\T \ T wave abnormality,  consider inferior ischemia Abnormal ECG Confirmed by Lita Mains  MD, Sherian Valenza  ([id number]) on 05/19/2014 12:47:25 AM      MDM   Final diagnoses:  Chest pain, unspecified chest pain type    I personally performed the services described in this documentation, which was scribed in my presence. The recorded information has been reviewed and is accurate.  Patient presents with left sided chest pain while at rest. Symptoms are now resolved. Patient does have risk factors for coronary artery disease. No previous workup for coronary artery disease. Initial troponin is normal. Questionable ST segment depression in inferior leads. No prior EKG for  comparison.  Patient continues to be pain-free. Discuss with cardiology will evaluate the patient in the emergency department and likely admit for further workup.  Julianne Rice, MD 05/19/14 0600

## 2014-05-19 NOTE — ED Notes (Signed)
Pt states that her left chest began hurting a few minutes ago. States the pain radiates to the left arm. Also c/o headache.

## 2014-05-19 NOTE — Discharge Instructions (Signed)
Follow up with your primary doctor.  Continue your home medications.  Heart healthy diabetic diet

## 2014-05-20 LAB — HEMOGLOBIN A1C
Hgb A1c MFr Bld: 7.4 % — ABNORMAL HIGH (ref 4.8–5.6)
Mean Plasma Glucose: 166 mg/dL

## 2014-12-24 ENCOUNTER — Ambulatory Visit: Payer: Managed Care, Other (non HMO)

## 2015-04-11 ENCOUNTER — Emergency Department (HOSPITAL_COMMUNITY)
Admission: EM | Admit: 2015-04-11 | Discharge: 2015-04-11 | Disposition: A | Discharge status: Home / Self Care | Payer: Managed Care, Other (non HMO) | Attending: Physician Assistant | Admitting: Physician Assistant

## 2015-04-11 ENCOUNTER — Emergency Department (HOSPITAL_COMMUNITY): Payer: Managed Care, Other (non HMO)

## 2015-04-11 ENCOUNTER — Encounter (HOSPITAL_COMMUNITY): Payer: Self-pay | Admitting: Radiology

## 2015-04-11 DIAGNOSIS — I10 Essential (primary) hypertension: Secondary | ICD-10-CM | POA: Insufficient documentation

## 2015-04-11 DIAGNOSIS — K529 Noninfective gastroenteritis and colitis, unspecified: Secondary | ICD-10-CM | POA: Diagnosis not present

## 2015-04-11 DIAGNOSIS — Z7982 Long term (current) use of aspirin: Secondary | ICD-10-CM | POA: Insufficient documentation

## 2015-04-11 DIAGNOSIS — E1165 Type 2 diabetes mellitus with hyperglycemia: Secondary | ICD-10-CM | POA: Diagnosis not present

## 2015-04-11 DIAGNOSIS — E876 Hypokalemia: Secondary | ICD-10-CM

## 2015-04-11 DIAGNOSIS — R509 Fever, unspecified: Secondary | ICD-10-CM

## 2015-04-11 DIAGNOSIS — R197 Diarrhea, unspecified: Secondary | ICD-10-CM

## 2015-04-11 DIAGNOSIS — Z79899 Other long term (current) drug therapy: Secondary | ICD-10-CM | POA: Diagnosis not present

## 2015-04-11 DIAGNOSIS — R5081 Fever presenting with conditions classified elsewhere: Secondary | ICD-10-CM | POA: Diagnosis not present

## 2015-04-11 DIAGNOSIS — R1013 Epigastric pain: Secondary | ICD-10-CM

## 2015-04-11 DIAGNOSIS — R110 Nausea: Secondary | ICD-10-CM

## 2015-04-11 LAB — CBC WITH DIFFERENTIAL/PLATELET
BASOS ABS: 0 10*3/uL (ref 0.0–0.1)
BASOS PCT: 0 %
EOS ABS: 0 10*3/uL (ref 0.0–0.7)
Eosinophils Relative: 0 %
HCT: 39.1 % (ref 36.0–46.0)
HEMOGLOBIN: 14.2 g/dL (ref 12.0–15.0)
Lymphocytes Relative: 24 %
Lymphs Abs: 1.6 10*3/uL (ref 0.7–4.0)
MCH: 30.9 pg (ref 26.0–34.0)
MCHC: 36.3 g/dL — AB (ref 30.0–36.0)
MCV: 85.2 fL (ref 78.0–100.0)
Monocytes Absolute: 0.5 10*3/uL (ref 0.1–1.0)
Monocytes Relative: 6 %
NEUTROS PCT: 70 %
Neutro Abs: 4.9 10*3/uL (ref 1.7–7.7)
Platelets: 179 10*3/uL (ref 150–400)
RBC: 4.59 MIL/uL (ref 3.87–5.11)
RDW: 13.1 % (ref 11.5–15.5)
WBC: 7 10*3/uL (ref 4.0–10.5)

## 2015-04-11 LAB — URINE MICROSCOPIC-ADD ON

## 2015-04-11 LAB — COMPREHENSIVE METABOLIC PANEL
ALBUMIN: 3.9 g/dL (ref 3.5–5.0)
ALK PHOS: 66 U/L (ref 38–126)
ALT: 30 U/L (ref 14–54)
ANION GAP: 11 (ref 5–15)
AST: 50 U/L — ABNORMAL HIGH (ref 15–41)
BUN: 14 mg/dL (ref 6–20)
CALCIUM: 8.9 mg/dL (ref 8.9–10.3)
CO2: 23 mmol/L (ref 22–32)
Chloride: 98 mmol/L — ABNORMAL LOW (ref 101–111)
Creatinine, Ser: 0.64 mg/dL (ref 0.44–1.00)
GFR calc Af Amer: >60 mL/min (ref >60)
GFR calc non Af Amer: >60 mL/min (ref >60)
GLUCOSE: 197 mg/dL — AB (ref 65–99)
Potassium: 2.9 mmol/L — ABNORMAL LOW (ref 3.5–5.1)
SODIUM: 132 mmol/L — AB (ref 135–145)
Total Bilirubin: 0.7 mg/dL (ref 0.3–1.2)
Total Protein: 8.2 g/dL — ABNORMAL HIGH (ref 6.5–8.1)

## 2015-04-11 LAB — URINALYSIS, ROUTINE W REFLEX MICROSCOPIC
BILIRUBIN URINE: NEGATIVE
Glucose, UA: NEGATIVE mg/dL
Ketones, ur: 15 mg/dL — AB
NITRITE: NEGATIVE
PH: 6 (ref 5.0–8.0)
Protein, ur: 100 mg/dL — AB
SPECIFIC GRAVITY, URINE: 1.026 (ref 1.005–1.030)

## 2015-04-11 LAB — LIPASE, BLOOD: Lipase: 39 U/L (ref 11–51)

## 2015-04-11 MED ORDER — ACETAMINOPHEN 500 MG PO TABS
1000.0000 mg | ORAL_TABLET | Freq: Once | ORAL | Status: AC
  Administered 2015-04-11: 1000 mg via ORAL
  Filled 2015-04-11: qty 2

## 2015-04-11 MED ORDER — METRONIDAZOLE 500 MG PO TABS: 500.0000 mg | ORAL_TABLET | Freq: Three times a day (TID) | ORAL | Status: DC

## 2015-04-11 MED ORDER — POTASSIUM CHLORIDE CRYS ER 20 MEQ PO TBCR
40.0000 meq | EXTENDED_RELEASE_TABLET | Freq: Once | ORAL | Status: AC
  Administered 2015-04-11: 40 meq via ORAL
  Filled 2015-04-11: qty 2

## 2015-04-11 MED ORDER — SODIUM CHLORIDE 0.9 % IV BOLUS (SEPSIS)
1000.0000 mL | Freq: Once | INTRAVENOUS | Status: AC
  Administered 2015-04-11: 1000 mL via INTRAVENOUS

## 2015-04-11 MED ORDER — LOPERAMIDE HCL 2 MG PO CAPS: 2.0000 mg | ORAL_CAPSULE | Freq: Four times a day (QID) | ORAL | Status: DC | PRN

## 2015-04-11 MED ORDER — ONDANSETRON HCL 4 MG/2ML IJ SOLN
4.0000 mg | Freq: Once | INTRAMUSCULAR | Status: AC
  Administered 2015-04-11: 4 mg via INTRAVENOUS
  Filled 2015-04-11: qty 2

## 2015-04-11 MED ORDER — IOHEXOL 300 MG/ML  SOLN
25.0000 mL | Freq: Once | INTRAMUSCULAR | Status: AC | PRN
  Administered 2015-04-11: 25 mL via ORAL

## 2015-04-11 MED ORDER — ONDANSETRON 4 MG PO TBDP: 4.0000 mg | ORAL_TABLET | Freq: Three times a day (TID) | ORAL | Status: DC | PRN

## 2015-04-11 MED ORDER — CIPROFLOXACIN HCL 500 MG PO TABS: 500.0000 mg | ORAL_TABLET | Freq: Two times a day (BID) | ORAL | Status: DC

## 2015-04-11 MED ORDER — IOHEXOL 300 MG/ML  SOLN
100.0000 mL | Freq: Once | INTRAMUSCULAR | Status: AC | PRN
  Administered 2015-04-11: 100 mL via INTRAVENOUS

## 2015-04-11 NOTE — ED Provider Notes (Signed)
Care assumed from [facility], [place], at shift change. Pt here with epigastric pain, nausea, diarrhea, and low-grade fever here, likely viral GI illness, feeling improved after zofran, fluids, and tylenol. Results so far show U/A with evidence of dehydration but no UTI, CBC w/diff unremarkable, CMP with mildly low Na 132 which corrects with glucose, K 2.9 which was replenished, AST 50 but otherwise remaining CMP WNL. Lipase WNL. Already PO challenged and tolerating well here. Awaiting CT abd/pelvis. Plan is to likely d/c home assuming CT is neg, likely viral GI illness, possibly home with symptomatic control meds. Amanda Duarte will write up d/c summary and meds.   Physical Exam  BP 117/67 mmHg  Pulse 59  Temp(Src) 98.4 F (36.9 C) (Oral)  Resp 16  SpO2 98%  Physical Exam Gen: afebrile on recheck, VSS, NAD HEENT: EOMI, MMM Resp: no resp distress CV: rate WNL Abd: Soft, NTND, +BS throughout, no r/g/r, neg murphy's, neg mcburney's, no CVA TTP  MsK: moving all extremities with ease Neuro: A&O x4  ED Course  Procedures Results for orders placed or performed during the hospital encounter of 04/11/15  CBC with Differential  Result Value Ref Range   WBC 7.0 4.0 - 10.5 K/uL   RBC 4.59 3.87 - 5.11 MIL/uL   Hemoglobin 14.2 12.0 - 15.0 g/dL   HCT 39.1 36.0 - 46.0 %   MCV 85.2 78.0 - 100.0 fL   MCH 30.9 26.0 - 34.0 pg   MCHC 36.3 (H) 30.0 - 36.0 g/dL   RDW 13.1 11.5 - 15.5 %   Platelets 179 150 - 400 K/uL   Neutrophils Relative % 70 %   Neutro Abs 4.9 1.7 - 7.7 K/uL   Lymphocytes Relative 24 %   Lymphs Abs 1.6 0.7 - 4.0 K/uL   Monocytes Relative 6 %   Monocytes Absolute 0.5 0.1 - 1.0 K/uL   Eosinophils Relative 0 %   Eosinophils Absolute 0.0 0.0 - 0.7 K/uL   Basophils Relative 0 %   Basophils Absolute 0.0 0.0 - 0.1 K/uL  Comprehensive metabolic panel  Result Value Ref Range   Sodium 132 (L) 135 - 145 mmol/L   Potassium 2.9 (L) 3.5 - 5.1 mmol/L   Chloride 98 (L) 101 - 111 mmol/L   CO2 23  22 - 32 mmol/L   Glucose, Bld 197 (H) 65 - 99 mg/dL   BUN 14 6 - 20 mg/dL   Creatinine, Ser 0.64 0.44 - 1.00 mg/dL   Calcium 8.9 8.9 - 10.3 mg/dL   Total Protein 8.2 (H) 6.5 - 8.1 g/dL   Albumin 3.9 3.5 - 5.0 g/dL   AST 50 (H) 15 - 41 U/L   ALT 30 14 - 54 U/L   Alkaline Phosphatase 66 38 - 126 U/L   Total Bilirubin 0.7 0.3 - 1.2 mg/dL   GFR calc non Af Amer >60 >60 mL/min   GFR calc Af Amer >60 >60 mL/min   Anion gap 11 5 - 15  Lipase, blood  Result Value Ref Range   Lipase 39 11 - 51 U/L  Urinalysis, Routine w reflex microscopic  Result Value Ref Range   Color, Urine AMBER (A) YELLOW   APPearance CLOUDY (A) CLEAR   Specific Gravity, Urine 1.026 1.005 - 1.030   pH 6.0 5.0 - 8.0   Glucose, UA NEGATIVE NEGATIVE mg/dL   Hgb urine dipstick MODERATE (A) NEGATIVE   Bilirubin Urine NEGATIVE NEGATIVE   Ketones, ur 15 (A) NEGATIVE mg/dL   Protein, ur 100 (  A) NEGATIVE mg/dL   Nitrite NEGATIVE NEGATIVE   Leukocytes, UA SMALL (A) NEGATIVE  Urine microscopic-add on  Result Value Ref Range   Squamous Epithelial / LPF 6-30 (A) NONE SEEN   WBC, UA 0-5 0 - 5 WBC/hpf   RBC / HPF 6-30 0 - 5 RBC/hpf   Bacteria, UA FEW (A) NONE SEEN   Ct Abdomen Pelvis W Contrast  04/11/2015  CLINICAL DATA:  Left upper quadrant abdominal pain, nausea, vomiting and diarrhea. Increased abdominal warmth per the patient. EXAM: CT ABDOMEN AND PELVIS WITH CONTRAST TECHNIQUE: Multidetector CT imaging of the abdomen and pelvis was performed using the standard protocol following bolus administration of intravenous contrast. CONTRAST:  128mL OMNIPAQUE IOHEXOL 300 MG/ML SOLN, 61mL OMNIPAQUE IOHEXOL 300 MG/ML SOLN COMPARISON:  Abdomen radiographs dated 05/03/2012. FINDINGS: Lower chest: Mildly prominent interstitial markings at the lung bases. Mild diffuse peribronchial thickening. Hepatobiliary: No masses or other significant abnormality. Pancreas: No mass, inflammatory changes, or other significant abnormality. Spleen: Within  normal limits in size and appearance. Adrenals/Urinary Tract: No masses identified. No evidence of hydronephrosis. Stomach/Bowel: Diffuse low density wall thickening involving the right colon, transverse colon, left colon and multiple distal small bowel loops. Normal appearing appendix. No gastric abnormalities. Vascular/Lymphatic: Minimally prominent right lower quadrant mesenteric lymph nodes without pathological enlargement. Mild atheromatous arterial calcifications. Reproductive: No mass or other significant abnormality. Other: Very small umbilical hernia containing fat. Musculoskeletal: Mild lumbar and lower thoracic spine degenerative changes. IMPRESSION: 1. Colitis and distal ileitis. This could be infectious or inflammatory in nature (e.g. Crohn's disease). 2. Mild bronchitic changes. Electronically Signed   By: Claudie Revering M.D.   On: 04/11/2015 16:28     Meds ordered this encounter  Medications  . sodium chloride 0.9 % bolus 1,000 mL    Sig:   . ondansetron (ZOFRAN) injection 4 mg    Sig:   . acetaminophen (TYLENOL) tablet 1,000 mg    Sig:   . potassium chloride SA (K-DUR,KLOR-CON) CR tablet 40 mEq    Sig:   . iohexol (OMNIPAQUE) 300 MG/ML solution 100 mL    Sig:   . iohexol (OMNIPAQUE) 300 MG/ML solution 25 mL    Sig:   . ondansetron (ZOFRAN ODT) 4 MG disintegrating tablet    Sig: Take 1 tablet (4 mg total) by mouth every 8 (eight) hours as needed for nausea or vomiting.    Dispense:  10 tablet    Refill:  0    Order Specific Question:  Supervising Provider    Answer:  Sabra Heck, BRIAN [[id number]]  . DISCONTD: loperamide (IMODIUM) 2 MG capsule    Sig: Take 1 capsule (2 mg total) by mouth 4 (four) times daily as needed for diarrhea or loose stools.    Dispense:  12 capsule    Refill:  0    Order Specific Question:  Supervising Provider    Answer:  MILLER, BRIAN [[id number]]  . ciprofloxacin (CIPRO) 500 MG tablet    Sig: Take 1 tablet (500 mg total) by mouth 2 (two) times daily. One po bid  x 7 days    Dispense:  14 tablet    Refill:  0    Order Specific Question:  Supervising Provider    Answer:  Sabra Heck, BRIAN [[id number]]  . metroNIDAZOLE (FLAGYL) 500 MG tablet    Sig: Take 1 tablet (500 mg total) by mouth 3 (three) times daily. One po tid x 7 days    Dispense:  21 tablet    Refill:  0    Order Specific Question:  Supervising Provider    Answer:  Noemi Chapel [[id number]]     MDM:   ICD-9-CM ICD-10-CM   1. Diarrhea, unspecified type 787.91 R19.7   2. Epigastric pain 789.06 R10.13   3. Colitis 558.9 K52.9   4. Other specified fever 780.60 R50.9   5. Hypokalemia 276.8 E87.6   6. Type 2 diabetes mellitus with hyperglycemia, without long-term current use of insulin (HCC) 250.00 E11.65    790.29    7. Nausea 787.02 R11.0     5:25 PM Recheck VS improved, afebrile now. Abdomen soft and nontender. CT shows colitis, will treat with cipro/flagyl. zofran written by Amanda Duarte. Will discontinue the immodium, since this seems infectious. Tolerating PO well here, I feel she can safely go home with close PCP f/up. BRAT diet discussed. F/up with [place] in 1wk. I explained the diagnosis and have given explicit precautions to return to the ER including for any other new or worsening symptoms. The patient understands and accepts the medical plan as it's been dictated and I have answered their questions. Discharge instructions concerning home care and prescriptions have been given. The patient is STABLE and is discharged to home in good condition.     Toua Stites Camprubi-Soms, PA-C 04/11/15 1726  [place], MD 04/16/15 [phone]

## 2015-04-11 NOTE — Discharge Instructions (Signed)
Use zofran as prescribed, as needed for nausea. Use tylenol or motrin as needed for pain or fever. Take antibiotics as directed. Stay well hydrated with small sips of fluids throughout the day. Follow a BRAT (banana-rice-applesauce-toast) diet as described below for the next 24-48 hours. The 'BRAT' diet is suggested, then progress to diet as tolerated as symptoms abate. Call your regular doctor if bloody stools, persistent diarrhea, vomiting, fever or abdominal pain. Follow up with [facility] in 1 week for recheck of symptoms and to establish care. Return to ER for changing or worsening of symptoms.  Food Choices to Help Relieve Diarrhea When you have diarrhea, the foods you eat and your eating habits are very important. Choosing the right foods and drinks can help relieve diarrhea. Also, because diarrhea can last up to 7 days, you need to replace lost fluids and electrolytes (such as sodium, potassium, and chloride) in order to help prevent dehydration.  WHAT GENERAL GUIDELINES DO I NEED TO FOLLOW?  Slowly drink 1 cup (8 oz) of fluid for each episode of diarrhea. If you are getting enough fluid, your urine will be clear or pale yellow.  Eat starchy foods. Some good choices include white rice, white toast, pasta, low-fiber cereal, baked potatoes (without the skin), saltine crackers, and bagels.  Avoid large servings of any cooked vegetables.  Limit fruit to two servings per day. A serving is  cup or 1 small piece.  Choose foods with less than 2 g of fiber per serving.  Limit fats to less than 8 tsp (38 g) per day.  Avoid fried foods.  Eat foods that have probiotics in them. Probiotics can be found in certain dairy products.  Avoid foods and beverages that may increase the speed at which food moves through the stomach and intestines (gastrointestinal tract). Things to avoid include:  High-fiber foods, such as dried fruit, raw fruits and vegetables, nuts, seeds, and whole grain  foods.  Spicy foods and high-fat foods.  Foods and beverages sweetened with high-fructose corn syrup, honey, or sugar alcohols such as xylitol, sorbitol, and mannitol. WHAT FOODS ARE RECOMMENDED? Grains White rice. White, [place], or pita breads (fresh or toasted), including plain rolls, buns, or bagels. White pasta. Saltine, soda, or graham crackers. Pretzels. Low-fiber cereal. Cooked cereals made with water (such as cornmeal, farina, or cream cereals). Plain muffins. Matzo. Melba toast. Zwieback.  Vegetables Potatoes (without the skin). Strained tomato and vegetable juices. Most well-cooked and canned vegetables without seeds. Tender lettuce. Fruits Cooked or canned applesauce, apricots, cherries, fruit cocktail, grapefruit, peaches, pears, or plums. Fresh bananas, apples without skin, cherries, grapes, cantaloupe, grapefruit, peaches, oranges, or plums.  Meat and Other Protein Products Baked or boiled chicken. Eggs. Tofu. Fish. Seafood. Smooth peanut butter. Ground or well-cooked tender beef, ham, veal, lamb, pork, or poultry.  Dairy Plain yogurt, kefir, and unsweetened liquid yogurt. Lactose-free milk, buttermilk, or soy milk. Plain hard cheese. Beverages Sport drinks. Clear broths. Diluted fruit juices (except prune). Regular, caffeine-free sodas such as ginger ale. Water. Decaffeinated teas. Oral rehydration solutions. Sugar-free beverages not sweetened with sugar alcohols. Other Bouillon, broth, or soups made from recommended foods.  The items listed above may not be a complete list of recommended foods or beverages. Contact your dietitian for more options. WHAT FOODS ARE NOT RECOMMENDED? Grains Whole grain, whole wheat, bran, or rye breads, rolls, pastas, crackers, and cereals. Wild or brown rice. Cereals that contain more than 2 g of fiber per serving. Corn tortillas or  taco shells. Cooked or dry oatmeal. Granola. Popcorn. Vegetables Raw vegetables. Cabbage, broccoli, Brussels  sprouts, artichokes, baked beans, beet greens, corn, kale, legumes, peas, sweet potatoes, and yams. Potato skins. Cooked spinach and cabbage. Fruits Dried fruit, including raisins and dates. Raw fruits. Stewed or dried prunes. Fresh apples with skin, apricots, mangoes, pears, raspberries, and strawberries.  Meat and Other Protein Products Chunky peanut butter. Nuts and seeds. Beans and lentils. Berniece Salines.  Dairy High-fat cheeses. Milk, chocolate milk, and beverages made with milk, such as milk shakes. Cream. Ice cream. Sweets and Desserts Sweet rolls, doughnuts, and sweet breads. Pancakes and waffles. Fats and Oils Butter. Cream sauces. Margarine. Salad oils. Plain salad dressings. Olives. Avocados.  Beverages Caffeinated beverages (such as coffee, tea, soda, or energy drinks). Alcoholic beverages. Fruit juices with pulp. Prune juice. Soft drinks sweetened with high-fructose corn syrup or sugar alcohols. Other Coconut. Hot sauce. Chili powder. Mayonnaise. Gravy. Cream-based or milk-based soups.  The items listed above may not be a complete list of foods and beverages to avoid. Contact your dietitian for more information. WHAT SHOULD I DO IF I BECOME DEHYDRATED? Diarrhea can sometimes lead to dehydration. Signs of dehydration include dark urine and dry mouth and skin. If you think you are dehydrated, you should rehydrate with an oral rehydration solution. These solutions can be purchased at pharmacies, retail stores, or online.  Drink -1 cup (120-240 mL) of oral rehydration solution each time you have an episode of diarrhea. If drinking this amount makes your diarrhea worse, try drinking smaller amounts more often. For example, drink 1-3 tsp (5-15 mL) every 5-10 minutes.  A general rule for staying hydrated is to drink 1-2 L of fluid per day. Talk to your health care provider about the specific amount you should be drinking each day. Drink enough fluids to keep your urine clear or pale  yellow. Document Released: 04/02/2003 Document Revised: 01/15/2013 Document Reviewed: 12/03/2012 [facility] Patient Information 2015 [place], [place]. This information is not intended to replace advice given to you by your health care provider. Make sure you discuss any questions you have with your health care provider.   Colitis (Colitis) La colitis es la inflamacin del colon y puede durar un breve perodo [place]) o prolongarse mucho tiempo (crnica). CAUSAS Esta afeccin puede ser causada por lo siguiente:  Virus.  Bacterias.  Reacciones a medicamentos.  Algunas enfermedades autoinmunitarias, como la enfermedad de Crohn y la colitis ulcerosa. SNTOMAS Los sntomas de esta afeccin incluyen lo siguiente:  Diarrea.  Materia fecal sanguinolenta o alquitranada.  Dolor.  Cristy Hilts.  Vmitos.  Cansancio (fatiga).  Prdida de peso.  Meteorismo.  Aumento repentino del dolor abdominal.  Menos deposiciones que lo habitual. DIAGNSTICO Esta afeccin se diagnostica mediante un anlisis de materia fecal o de sangre. Tambin pueden [employer], como radiografas, una tomografa computarizada (TC) o una colonoscopia. TRATAMIENTO El tratamiento puede incluir lo siguiente:  Orthoptist a los intestinos, es Software engineer, no consumir alimentos ni lquidos durante un tiempo.  Administracin de lquidos por va intravenosa (IV).  Medicamentos para Conservation officer, historic buildings y Building services engineer.  Antibiticos.  Medicamentos con cortisona.  Ciruga. INSTRUCCIONES PARA EL CUIDADO EN EL HOGAR Comida y bebida  Siga las indicaciones del mdico respecto de las restricciones para las comidas o las bebidas.  Beba suficiente lquido para Consulting civil engineer orina clara o de color amarillo plido.  Trabaje con un nutricionista para determinar cules son los alimentos que Programmer, applications.  Evite los alimentos que reagudizan la afeccin.  Consumir una Pharmacologist. Medicamentos  [employer] de venta libre y los recetados solamente como se lo haya indicado el mdico.  Si le recetaron un antibitico, tmelo como se lo haya indicado el mdico. No deje de tomar los antibiticos aunque comience a sentirse mejor. Instrucciones generales  Concurra a todas las visitas de control como se lo haya indicado el mdico. Esto es importante. SOLICITE ATENCIN MDICA SI:  Los sntomas no desaparecen.  Presenta nuevos sntomas. SOLICITE ATENCIN MDICA DE INMEDIATO SI:  Tiene fiebre que no desaparece con tratamiento.  Comienza a sentir escalofros.  Se siente muy dbil, se desmaya o se deshidrata.  Ha vomitado repetidas veces.  Siente dolor intenso en el abdomen.  Su materia fecal es sanguinolenta o alquitranada.   Esta informacin no tiene Marine scientist el consejo del mdico. Asegrese de hacerle al mdico cualquier pregunta que tenga.   Document Released: 01/10/2005 Document Revised: 10/01/2014 Elsevier Interactive Patient Education 2016 [place]  (Diarrhea) La diarrea es la materia fecal (heces) acuosas. Puede hacerlo sentir dbil, cansado, sediento, o con la boca seca (signos de deshidratacin). La materia fecal acuosa es signo de otro problema, generalmente una infeccin. Suele durar 2 o 3 das. Puede durar ms tiempo si es el signo de una enfermedad grave. Cudese segn lo que le indique su mdico.  CUIDADOS EN EL HOGAR   Beba 1 taza (8 onzas) de lquido cada vez que tenga una deposicin acuosa.  No beba los siguientes lquidos:  Los que contengan azcares simples (fructosa, glucosa, galactosa, [place], sacarosa, maltosa).  Bebidas deportivas.  Jugos de fruta.  Productos lcteos enteros.  Gaseosas.  Bebidas con cafena (caf, t, gaseosas) o alcohol.  Puede usar soluciones de rehidratacin oral si su mdico lo autoriza. Usted mismo puede preparar la solucin. Siga esta receta:   -  cucharadita de sal.   cucharadita de  bicarbonato de soda.   de cucharadita de sal sustituta que contenga cloruro de potasio.  1  cucharada de azcar.  1 litros (34 oz) de agua.  Evite los siguientes alimentos:  Los que tienen gran contenido de [place], como frutas y verduras.  Frutas secas, semillas y panes y cereales integrales.   Las endulzadas con alcohol de azcar (xylitol, sorbitol, manitol).  Trate de comer los siguientes alimentos:  Los que tienen almidn, como arroz, pan, pasta, cereales bajos en azcar, avena, smola de maz, papas al horno, galletas y panecillos.  Bananas.  Pur de [employer].  Consuma alimentos ricos en probiticos, como yogur y productos lcteos fermentados.  Lvese bien las manos cada vez que tenga una deposicin acuosa.  Slo tome los medicamentos que le haya indicado su mdico.  Tome un bao caliente para ayudar a Transport planner ardor o dolor que producen las deposiciones aguadas. SOLICITE AYUDA DE INMEDIATO SI:   No puede beber lquidos sin devolver vomitar.  Sigue vomitando.  Tiene sangre en la materia fecal, o es de color negro y de aspecto alquitranado.  No hay emisin de [place] durante 6 a 8 horas o elimina una pequea cantidad de [place].  Usted tiene dolor de estmago (abdominal) que empeora o se mantiene en el mismo lugar (localiza).  Se siente dbil, mareado, confundido o se desmaya.  Siente un dolor de cabeza muy intenso.  La materia fecal acuosa no mejora.  Tiene fiebre o sntomas que persisten durante ms de 2-3 das.  Tiene fiebre y los sntomas empeoran de manera sbita. ASEGRESE DE QUE:  Comprende estas instrucciones.  Controlar su enfermedad.  Solicitar ayuda de inmediato si no mejora o si empeora.   Esta informacin no tiene Marine scientist el consejo del mdico. Asegrese de hacerle al mdico cualquier pregunta que tenga.   Document Released: 12/28/2011 Document Revised: 01/31/2014 Elsevier Interactive Patient Education 2016  [place] de alimentos para ayudar a Public house manager la diarrea - Adultos (Food Choices to Help Relieve Diarrhea, Adult) Cuando se tiene diarrea, los alimentos que se ingieren y los hbitos de alimentacin son Theatre stage manager. Elegir los [employer] y las bebidas adecuados ayuda a Holiday representative. [place], debido a que la diarrea puede [place], debe reponer la prdida de lquidos y Brewing technologist (como sodio, potasio y Financial controller) a fin de ayudar a Tree surgeon.  [place]?  Beba lentamente 1 taza (8 onzas) de lquido por cada episodio de diarrea. Si bebe una cantidad de lquidos suficiente, la orina ser de tono claro o color amarillo plido.  Consuma alimentos con almidn. Algunas buenas opciones son arroz blanco, tostada blanca, pasta, cereales con bajo contenido de fibras, papas al horno (sin cscara), galletas saladas y panecillos.  Evite las porciones grandes de cualquier vegetal cocido.  [place] a dos porciones por da. Una porcin es  taza o un trozo pequeo.  Alimentos con menos de 2 g de fibra por porcin.  Limite las grasas a menos de 8 cucharaditas (38g) por Training and development officer.  Evite las comidas fritas.  Consuma alimentos que contengan probiticos. Los probiticos se encuentran en ciertos productos lcteos.  Evite los alimentos y las bebidas que pueden aumentar la velocidad a la que el alimento se mueve a travs del estmago y de los intestinos (tracto gastrointestinal). Lo que debe evitar:  Alimentos ricos en fibra, como frutas secas, frutas y vegetales crudos, frutos secos, semillas, alimentos con cereales integrales.  Alimentos muy condimentados y con alto contenido de Physicist, medical.  Alimentos y bebidas endulzados con jarabe de maz de alto contenido de fructosa, miel o alcoholes de Location manager, como xilitol, sorbitol y manitol. QU ALIMENTOS SE RECOMIENDAN? Cereales Arroz [place], francs o pita (fresco o tostado),  incluidos los [place], los bollos y las rosquillas. Pastas blancas. Galletas de [place], [place] o [place]. Pretzels. Cereales con bajo contenido de [employer] cocidos en agua (como harina de maz, smola o crema de cereales). Muffins. [place]. [place].  Vegetales Papas (sin cscara). Jugo de tomates o de vegetales Vegetales bien cocidos o enlatados sin semillas. Valeda Malm tierna. Frutas Pur de [employer] cocido o enlatado, damascos, cerezas, cctel de frutas, pomelos, duraznos, peras o ciruelas. Bananas frescas, manzanas sin cscara, cerezas, uvas, meln, pomelo, duraznos, naranjas o ciruelas.  Carnes y otros productos con protenas Pollo al horno o hervido. Huevos. Tofu. Pescado. Mariscos. Los Lunas de man, sin trozos. Carne molida o un bife tierno bien cocido, jamn, ternera, cordero, cerdo o aves.  Lcteos Yogur natural, kefir y Estate agent bebible sin Risk analyst. Leche sin Comptroller, suero de [place] de soja. Queso duro comn. Bebidas Bebidas deportivas. Caldos claros. Jugos de fruta diluidos (excepto de ciruelas). Gaseosas sin cafena comunes, como gaseosa de [place]. Agua. Ts descafeinados. Soluciones de rehidratacin oral. Bebidas sin azcar no endulzadas con alcoholes de azcar. Otros Consom, caldo o sopas hechas con los alimentos recomendados.  Los artculos mencionados arriba pueden no ser [employer] de las bebidas o los alimentos recomendados. Comunquese con el nutricionista para conocer ms opciones. QU ALIMENTOS NO SE RECOMIENDAN?  Cereales Cereales, galletas, pastas, panecillos y panes de cereales integrales, salvado o centeno. Arroz integral o arroz salvaje. Cereales con menos de 2 g de fibra por porcin. Tortillas de maz o tacos. Harina de avena cocida o seca. Granola. Palomitas de maz. Vegetales Vegetales crudos. Repollo, brcoli, repollitos de [place], alcachofas, porotos, hojas de remolacha, maz, col rizada, legumbres, guisantes y batatas. Cscara  de papas. [place] y repollo cocidos. Lambert Mody Frutas secas, incluidas las ciruelas y los dtiles. Frutas crudas. Compota o ciruelas secas. Manzanas frescas con cscara, damascos, mangos, peras, frambuesas y frutillas.  Carnes y otros productos con protenas [place] de man espesa. Frutos secos y semillas. Porotos y lentejas. Panceta.  Lcteos Quesos con alto contenido de [place]. Leche, leche chocolatada y bebidas hechas con [place], como los batidos. Crema. Helados. Dulces y [employer], donas y pan dulce. Panqueques y waffles. Grasas y [employer]. Salsas a base de crema. Margarina. Aceites para ensaladas. Condimentos para ensaladas. Aceitunas. [place].  Bebidas Bebidas con cafena (como caf, t, refrescos o bebidas energizantes). Bebidas alcohlicas. Jugos de frutas con pulpa. Jugo de ciruelas. Bebidas endulzadas con jarabe de maz de alto contenido de fructosa o alcoholes de Location manager. Otros Coco. Salsa picante. [place] en polvo. [place]. Salsas. Sopas a base de crema o [place].  Los artculos mencionados arriba pueden no ser [employer] de las bebidas y los alimentos que se Higher education careers adviser. Comunquese con el nutricionista para recibir ms informacin. QU DEBO HACER SI ME DESHIDRATO? Algunas veces, la diarrea puede producir deshidratacin. Entre los signos de deshidratacin se incluyen la orina oscura y la boca y la piel secas. Si piensa que est deshidratado, debe rehidratarse con una solucin de rehidratacin oral. Estas soluciones se pueden comprar en las farmacias, en las tiendas minoristas o por Internet.  Beba  o 1 taza (120-249ml) de solucin de rehidratacin oral cada vez que tenga un episodio de diarrea. Si beber esta cantidad empeora la diarrea, intente beber en cantidades ms pequeas con ms frecuencia. Por ejemplo, tomar 1-3 cucharaditas (5-35ml) cada 5-46minutos.  Una regla general para mantenerse hidratado es beber 1  -2 litros de lquido Systems developer. Hable con el mdico sobre la cantidad especfica que usted debe beber diariamente. Beba suficiente lquido para Consulting civil engineer orina clara o de color amarillo plido.   Esta informacin no tiene Marine scientist el consejo del mdico. Asegrese de hacerle al mdico cualquier pregunta que tenga.   Document Released: 01/10/2005 Document Revised: 01/31/2014 Elsevier Interactive Patient Education 2016 [place], Adulto (Nausea, Adult)  La nusea es la sensacin de Tree surgeon en el estmago o de la necesidad de Training and development officer. No constituye una preocupacin seria en s misma, pero puede ser un signo de problemas mdicos ms graves. Si empeora, puede provocar vmitos. Si aparecen vmitos, hay riesgo de deshidratacin.  CAUSES   Infecciones por virus.  Intoxicacin alimentaria.  Medicamentos.  Embarazo.  Mareos por movimiento.  Cefaleas migraosas.  Estrs emocional.  Dolor intenso producido en Clinical cytogeneticist.  Intoxicacin por alcohol. INSTRUCCIONES PARA EL CUIDADO EN EL HOGAR   Debe hacer reposo.  Pida instrucciones especficas a su mdico con respecto a la rehidratacin.  Consuma cantidades pequeas de alimentos y tome sorbos de lquidos con ms frecuencia.  [employer] como le indic el mdico. SOLICITE ATENCIN MDICA SI:   No mejora, o [place], despus de 2 das de tratamiento.  Tiene cefalea. SOLICITE ATENCIN MDICA DE INMEDIATO SI:  Tiene fiebre.  Se desmaya.  Sigue  vomitando u observa sangre en el vmito.  Se siente extremadamente dbil o deshidratado.  La materia fecal es negra o tiene [place].  Siente dolor intenso en el pecho o en el abdomen. ASEGRESE DE QUE:   Comprende estas instrucciones.  Controlar su enfermedad.  Solicitar ayuda de inmediato si no mejora o si empeora.   Esta informacin no tiene Marine scientist el consejo del mdico. Asegrese de hacerle al mdico cualquier pregunta que tenga.   Document  Released: 01/10/2005 Document Revised: 10/05/2011 Elsevier Interactive Patient Education [employer].

## 2015-04-11 NOTE — ED Provider Notes (Signed)
CSN: [id number]     Arrival date & time 04/11/15  1121 History   First MD Initiated Contact with Patient 04/11/15 1138     Chief Complaint  Patient presents with  . Diarrhea  . Emesis  . Abdominal Pain     (Consider location/radiation/quality/duration/timing/severity/associated sxs/prior Treatment) HPI   Patient is a 60 year old female with past medical history of hypertension diabetes who presents to the ED with complaints of abdominal pain, onset 5 days. Patient reports having intermittent cramping pain to her epigastric region, denies any aggravating factors. Endorses associated nausea and nonbloody diarrhea (4-6 episodes daily). She notes her abdominal pain is alleviated after having a bowel movement. Denies fever, chills, headache, nasal congestion, rhinorrhea, sore throat, cough, shortness of breath, chest pain, vomiting, urinary symptoms, vaginal bleeding, vaginal discharge. Patient endorses taking aspirin at home intermittently with mild intermittent relief. She notes her last menstrual cycle 6 years ago. Denies history of abdominal surgeries.  Past Medical History  Diagnosis Date  . Hypertension   . Diabetes mellitus without complication ([place])    No past surgical history on file. No family history on file. Social History  Substance Use Topics  . Smoking status: Never Smoker   . Smokeless tobacco: None  . Alcohol Use: No   OB History    No data available     Review of Systems  Gastrointestinal: Positive for nausea, abdominal pain and diarrhea.  All other systems reviewed and are negative.     Allergies  Review of patient's allergies indicates no known allergies.  Home Medications   Prior to Admission medications   Medication Sig Start Date End Date Taking? Authorizing Provider  aspirin EC 81 MG EC tablet Take 1 tablet (81 mg total) by mouth daily. 05/19/14  Yes Isaiah Serge, NP  famotidine (PEPCID) 20 MG tablet Take 1 tablet (20 mg total) by mouth 2 (two)  times daily. 05/19/14  Yes Isaiah Serge, NP  ciprofloxacin (CIPRO) 500 MG tablet Take 1 tablet (500 mg total) by mouth 2 (two) times daily. One po bid x 7 days 04/11/15   Mercedes Camprubi-Soms, PA-C  metroNIDAZOLE (FLAGYL) 500 MG tablet Take 1 tablet (500 mg total) by mouth 3 (three) times daily. One po tid x 7 days 04/11/15   Mercedes Camprubi-Soms, PA-C  ondansetron (ZOFRAN ODT) 4 MG disintegrating tablet Take 1 tablet (4 mg total) by mouth every 8 (eight) hours as needed for nausea or vomiting. 04/11/15   Nona Dell, PA-C  PRESCRIPTION MEDICATION Take 1 tablet by mouth daily. For high blood pressure. Sample from MD. Unknown name/strength.    Historical Provider, MD  PRESCRIPTION MEDICATION Take 1 tablet by mouth daily. For diabetes. Sample from MD. Unknown name/strength.    Historical Provider, MD   BP 117/67 mmHg  Pulse 59  Temp(Src) 98.4 F (36.9 C) (Oral)  Resp 16  SpO2 98% Physical Exam  Constitutional: She is oriented to person, place, and time. She appears well-developed and well-nourished. No distress.  HENT:  Head: Normocephalic and atraumatic.  Mouth/Throat: Oropharynx is clear and moist. No oropharyngeal exudate.  Eyes: Conjunctivae and EOM are normal. Right eye exhibits no discharge. Left eye exhibits no discharge. No scleral icterus.  Neck: Normal range of motion. Neck supple.  Cardiovascular: Normal rate, regular rhythm, normal heart sounds and intact distal pulses.   Pulmonary/Chest: Effort normal and breath sounds normal. No respiratory distress. She has no wheezes. She has no rales. She exhibits no tenderness.  Abdominal: Soft. Bowel sounds  are normal. She exhibits no distension and no mass. There is tenderness (epigastric and periumbilical). There is no rebound and no guarding.  Musculoskeletal: She exhibits no edema.  Lymphadenopathy:    She has no cervical adenopathy.  Neurological: She is alert and oriented to person, place, and time.  Skin: Skin is warm  and dry. She is not diaphoretic.  Nursing note and vitals reviewed.   ED Course  Procedures (including critical care time) Labs Review Labs Reviewed  CBC WITH DIFFERENTIAL/PLATELET - Abnormal; Notable for the following:    MCHC 36.3 (*)    All other components within normal limits  COMPREHENSIVE METABOLIC PANEL - Abnormal; Notable for the following:    Sodium 132 (*)    Potassium 2.9 (*)    Chloride 98 (*)    Glucose, Bld 197 (*)    Total Protein 8.2 (*)    AST 50 (*)    All other components within normal limits  URINALYSIS, ROUTINE W REFLEX MICROSCOPIC (NOT AT [facility]) - Abnormal; Notable for the following:    Color, Urine AMBER (*)    APPearance CLOUDY (*)    Hgb urine dipstick MODERATE (*)    Ketones, ur 15 (*)    Protein, ur 100 (*)    Leukocytes, UA SMALL (*)    All other components within normal limits  URINE MICROSCOPIC-ADD ON - Abnormal; Notable for the following:    Squamous Epithelial / LPF 6-30 (*)    Bacteria, UA FEW (*)    All other components within normal limits  LIPASE, BLOOD    Imaging Review Ct Abdomen Pelvis W Contrast  04/11/2015  CLINICAL DATA:  Left upper quadrant abdominal pain, nausea, vomiting and diarrhea. Increased abdominal warmth per the patient. EXAM: CT ABDOMEN AND PELVIS WITH CONTRAST TECHNIQUE: Multidetector CT imaging of the abdomen and pelvis was performed using the standard protocol following bolus administration of intravenous contrast. CONTRAST:  166mL OMNIPAQUE IOHEXOL 300 MG/ML SOLN, 72mL OMNIPAQUE IOHEXOL 300 MG/ML SOLN COMPARISON:  Abdomen radiographs dated 05/03/2012. FINDINGS: Lower chest: Mildly prominent interstitial markings at the lung bases. Mild diffuse peribronchial thickening. Hepatobiliary: No masses or other significant abnormality. Pancreas: No mass, inflammatory changes, or other significant abnormality. Spleen: Within normal limits in size and appearance. Adrenals/Urinary Tract: No masses identified. No evidence of  hydronephrosis. Stomach/Bowel: Diffuse low density wall thickening involving the right colon, transverse colon, left colon and multiple distal small bowel loops. Normal appearing appendix. No gastric abnormalities. Vascular/Lymphatic: Minimally prominent right lower quadrant mesenteric lymph nodes without pathological enlargement. Mild atheromatous arterial calcifications. Reproductive: No mass or other significant abnormality. Other: Very small umbilical hernia containing fat. Musculoskeletal: Mild lumbar and lower thoracic spine degenerative changes. IMPRESSION: 1. Colitis and distal ileitis. This could be infectious or inflammatory in nature (e.g. Crohn's disease). 2. Mild bronchitic changes. Electronically Signed   By: Claudie Revering M.D.   On: 04/11/2015 16:28   I have personally reviewed and evaluated these images and lab results as part of my medical decision-making.   EKG Interpretation None      MDM   Final diagnoses:  Diarrhea, unspecified type  Epigastric pain  Colitis  Other specified fever  Hypokalemia  Type 2 diabetes mellitus with hyperglycemia, without long-term current use of insulin (HCC)  Nausea    Patient presents with abdominal pain, nausea and nonbloody diarrhea. VSS. Exam revealed epigastric and periumbilical tenderness, no peritoneal signs. Patient given IV fluids and Zofran. I was notified by nurse that on recheck of vitals patient's  temperature increased to 101.8, pt given tylenol. On reevaluation patient reports she is starting to feel better and denies any episodes of diarrhea while in the ED. NA 132, K 2.9. Pt given oral potassium replenishment in the ED. Will order CT abdomen for further evaluation. Pt tolerating PO in the ED.  4:30- Hand-off to Mercedes Camprubi-Soms. CT abdomen pending. Dispo pending imaging but suspect possible viral GI illness with plan to d/c home.    Chesley Noon [place], [place] 04/12/15 [id number]  Courteney Julio Alm, MD 04/14/15 1526

## 2015-04-11 NOTE — ED Notes (Signed)
Pt reports her hands and abd has felt "hot" since Monday. Pt has had n/v/d at home. Pt has also had LUQ abd pain.

## 2016-04-04 ENCOUNTER — Ambulatory Visit (HOSPITAL_COMMUNITY)
Admission: EM | Admit: 2016-04-04 | Discharge: 2016-04-04 | Disposition: A | Discharge status: Home / Self Care | Payer: 59 | Attending: Emergency Medicine | Admitting: Emergency Medicine

## 2016-04-04 ENCOUNTER — Encounter (HOSPITAL_COMMUNITY): Payer: Self-pay | Admitting: Emergency Medicine

## 2016-04-04 DIAGNOSIS — Z79899 Other long term (current) drug therapy: Secondary | ICD-10-CM | POA: Insufficient documentation

## 2016-04-04 DIAGNOSIS — R319 Hematuria, unspecified: Secondary | ICD-10-CM | POA: Diagnosis not present

## 2016-04-04 DIAGNOSIS — I10 Essential (primary) hypertension: Secondary | ICD-10-CM | POA: Insufficient documentation

## 2016-04-04 DIAGNOSIS — Z7982 Long term (current) use of aspirin: Secondary | ICD-10-CM | POA: Diagnosis not present

## 2016-04-04 DIAGNOSIS — E119 Type 2 diabetes mellitus without complications: Secondary | ICD-10-CM | POA: Insufficient documentation

## 2016-04-04 DIAGNOSIS — N390 Urinary tract infection, site not specified: Secondary | ICD-10-CM | POA: Diagnosis present

## 2016-04-04 DIAGNOSIS — R300 Dysuria: Secondary | ICD-10-CM | POA: Diagnosis not present

## 2016-04-04 DIAGNOSIS — R109 Unspecified abdominal pain: Secondary | ICD-10-CM | POA: Insufficient documentation

## 2016-04-04 MED ORDER — PHENAZOPYRIDINE HCL 200 MG PO TABS: 200.0000 mg | ORAL_TABLET | Freq: Three times a day (TID) | ORAL | 0 refills | Status: DC

## 2016-04-04 MED ORDER — AMOXICILLIN 500 MG PO CAPS: 500.0000 mg | ORAL_CAPSULE | Freq: Three times a day (TID) | ORAL | 0 refills | Status: DC

## 2016-04-04 NOTE — ED Provider Notes (Signed)
CSN: [id number]     Arrival date & time 04/04/16  1843 History   None    Chief Complaint  Patient presents with  . Abdominal Pain   (Consider location/radiation/quality/duration/timing/severity/associated sxs/prior Treatment) The history is provided by the patient. No language interpreter was used.  Dysuria  Pain quality:  Aching Pain severity:  Moderate Onset quality:  Gradual Duration:  2 days Timing:  Constant Progression:  Worsening Chronicity:  New Recent urinary tract infections: no   Relieved by:  Nothing Worsened by:  Nothing Ineffective treatments:  None tried Associated symptoms: nausea   Associated symptoms: no fever and no flank pain   Risk factors: no sexually transmitted infections and no urinary catheter     Past Medical History:  Diagnosis Date  . Diabetes mellitus without complication ([place])   . Hypertension    History reviewed. No pertinent surgical history. No family history on file. Social History  Substance Use Topics  . Smoking status: Never Smoker  . Smokeless tobacco: Never Used  . Alcohol use No   OB History    No data available     Review of Systems  Constitutional: Negative for fever.  Gastrointestinal: Positive for nausea.  Genitourinary: Positive for dysuria. Negative for flank pain.  All other systems reviewed and are negative.   Allergies  Patient has no known allergies.  Home Medications   Prior to Admission medications   Medication Sig Start Date End Date Taking? Authorizing Provider  amoxicillin (AMOXIL) 500 MG capsule Take 1 capsule (500 mg total) by mouth 3 (three) times daily. 04/04/16   Fransico Meadow, PA-C  aspirin EC 81 MG EC tablet Take 1 tablet (81 mg total) by mouth daily. 05/19/14   Isaiah Serge, NP  ciprofloxacin (CIPRO) 500 MG tablet Take 1 tablet (500 mg total) by mouth 2 (two) times daily. One po bid x 7 days 04/11/15   Mercedes Street, PA-C  famotidine (PEPCID) 20 MG tablet Take 1 tablet (20 mg total) by mouth  2 (two) times daily. 05/19/14   Isaiah Serge, NP  metroNIDAZOLE (FLAGYL) 500 MG tablet Take 1 tablet (500 mg total) by mouth 3 (three) times daily. One po tid x 7 days 04/11/15   Mercedes Street, PA-C  ondansetron (ZOFRAN ODT) 4 MG disintegrating tablet Take 1 tablet (4 mg total) by mouth every 8 (eight) hours as needed for nausea or vomiting. 04/11/15   Nona Dell, PA-C  phenazopyridine (PYRIDIUM) 200 MG tablet Take 1 tablet (200 mg total) by mouth 3 (three) times daily. 04/04/16   Fransico Meadow, PA-C  PRESCRIPTION MEDICATION Take 1 tablet by mouth daily. For high blood pressure. Sample from MD. Unknown name/strength.    Historical Provider, MD  PRESCRIPTION MEDICATION Take 1 tablet by mouth daily. For diabetes. Sample from MD. Unknown name/strength.    Historical Provider, MD   Meds Ordered and Administered this Visit  Medications - No data to display  BP 138/59 (BP Location: Right Arm)   Pulse 67   Temp 98.8 F (37.1 C) (Oral)   Resp 16   SpO2 97%  No data found.   Physical Exam  Constitutional: She is oriented to person, place, and time. She appears well-developed and well-nourished.  HENT:  Head: Normocephalic.  Eyes: EOM are normal.  Neck: Normal range of motion.  Pulmonary/Chest: Effort normal.  Abdominal: Soft. She exhibits no distension. There is no tenderness. There is no guarding.  Musculoskeletal: Normal range of motion.  Neurological: She is  alert and oriented to person, place, and time.  Psychiatric: She has a normal mood and affect.  Nursing note and vitals reviewed.   Urgent Care Course     Procedures (including critical care time)  Labs Review Labs Reviewed - No data to display ua gluc 500 mod blood 30 protein  Imaging Review No results found.   Visual Acuity Review  Right Eye Distance:   Left Eye Distance:   Bilateral Distance:    Right Eye Near:   Left Eye Near:    Bilateral Near:         MDM   1. Urinary tract infection with  hematuria, site unspecified    Urine culture pending An After Visit Summary was printed and given to the patient. Meds ordered this encounter  Medications  . DISCONTD: phenazopyridine (PYRIDIUM) 200 MG tablet    Sig: Take 1 tablet (200 mg total) by mouth 3 (three) times daily.    Dispense:  6 tablet    Refill:  0    Order Specific Question:   Supervising Provider    Answer:   Billy Fischer [phone]  . DISCONTD: amoxicillin (AMOXIL) 500 MG capsule    Sig: Take 1 capsule (500 mg total) by mouth 3 (three) times daily.    Dispense:  21 capsule    Refill:  0    Order Specific Question:   Supervising Provider    Answer:   Billy Fischer [phone]  . amoxicillin (AMOXIL) 500 MG capsule    Sig: Take 1 capsule (500 mg total) by mouth 3 (three) times daily.    Dispense:  21 capsule    Refill:  0    Order Specific Question:   Supervising Provider    Answer:   Billy Fischer [phone]  . phenazopyridine (PYRIDIUM) 200 MG tablet    Sig: Take 1 tablet (200 mg total) by mouth 3 (three) times daily.    Dispense:  6 tablet    Refill:  0    Order Specific Question:   Supervising Provider    Answer:   Ihor Gully D [place], PA-C 04/04/16 2134

## 2016-04-04 NOTE — ED Triage Notes (Signed)
Daughter stated, she's had lower stomach pain and having urinary burning since yesterday.

## 2016-04-04 NOTE — Discharge Instructions (Signed)
Return if any problems. See your Physician for recheck if symptoms persist  

## 2016-04-05 LAB — POCT URINALYSIS DIP (DEVICE)
Bilirubin Urine: NEGATIVE
Glucose, UA: 500 mg/dL — AB
Ketones, ur: NEGATIVE mg/dL
Leukocytes, UA: NEGATIVE
NITRITE: NEGATIVE
PH: 6 (ref 5.0–8.0)
PROTEIN: 30 mg/dL — AB
Specific Gravity, Urine: 1.02 (ref 1.005–1.030)
UROBILINOGEN UA: 1 mg/dL (ref 0.0–1.0)

## 2016-04-07 ENCOUNTER — Telehealth (HOSPITAL_COMMUNITY): Payer: Self-pay | Admitting: Internal Medicine

## 2016-04-07 LAB — URINE CULTURE

## 2016-04-07 MED ORDER — CEPHALEXIN 500 MG PO CAPS
500.0000 mg | ORAL_CAPSULE | Freq: Two times a day (BID) | ORAL | 0 refills | Status: AC
Start: 2016-04-07 — End: 2016-04-12

## 2016-04-07 NOTE — Telephone Encounter (Signed)
Clinical staff, please let patient know that urine culture was positive for E coli germ resistant to amoxicillin, but sensitive to cephalexin.  Rx cephalexin sent to pharmacy of record, [place] [facility].  Stop amoxicillin and take cephalexin.  Recheck for further evaluation if symptoms are not improving.  LM

## 2016-04-19 ENCOUNTER — Other Ambulatory Visit: Payer: Self-pay | Admitting: Gastroenterology

## 2016-04-19 DIAGNOSIS — R1033 Periumbilical pain: Secondary | ICD-10-CM

## 2016-06-08 ENCOUNTER — Other Ambulatory Visit: Payer: Self-pay | Admitting: Internal Medicine

## 2016-06-08 DIAGNOSIS — Z1231 Encounter for screening mammogram for malignant neoplasm of breast: Secondary | ICD-10-CM

## 2016-06-24 ENCOUNTER — Ambulatory Visit: Payer: 59

## 2016-07-12 ENCOUNTER — Ambulatory Visit: Payer: 59

## 2016-09-07 ENCOUNTER — Ambulatory Visit: Payer: 59 | Attending: Family Medicine | Admitting: Family Medicine

## 2016-09-07 ENCOUNTER — Telehealth: Payer: Self-pay

## 2016-09-07 ENCOUNTER — Encounter: Payer: Self-pay | Admitting: Family Medicine

## 2016-09-07 VITALS — BP 169/75 | HR 58 | Temp 97.8°F | Resp 18 | Ht 59.0 in | Wt 135.4 lb

## 2016-09-07 DIAGNOSIS — E1165 Type 2 diabetes mellitus with hyperglycemia: Secondary | ICD-10-CM | POA: Insufficient documentation

## 2016-09-07 DIAGNOSIS — I10 Essential (primary) hypertension: Secondary | ICD-10-CM | POA: Insufficient documentation

## 2016-09-07 DIAGNOSIS — Z6830 Body mass index (BMI) 30.0-30.9, adult: Secondary | ICD-10-CM | POA: Insufficient documentation

## 2016-09-07 DIAGNOSIS — Z79899 Other long term (current) drug therapy: Secondary | ICD-10-CM | POA: Insufficient documentation

## 2016-09-07 DIAGNOSIS — Z7984 Long term (current) use of oral hypoglycemic drugs: Secondary | ICD-10-CM | POA: Insufficient documentation

## 2016-09-07 DIAGNOSIS — Z13220 Encounter for screening for lipoid disorders: Secondary | ICD-10-CM

## 2016-09-07 DIAGNOSIS — Z1231 Encounter for screening mammogram for malignant neoplasm of breast: Secondary | ICD-10-CM

## 2016-09-07 DIAGNOSIS — Z0000 Encounter for general adult medical examination without abnormal findings: Secondary | ICD-10-CM | POA: Diagnosis not present

## 2016-09-07 DIAGNOSIS — Z7982 Long term (current) use of aspirin: Secondary | ICD-10-CM | POA: Insufficient documentation

## 2016-09-07 DIAGNOSIS — E669 Obesity, unspecified: Secondary | ICD-10-CM | POA: Diagnosis not present

## 2016-09-07 DIAGNOSIS — E119 Type 2 diabetes mellitus without complications: Secondary | ICD-10-CM | POA: Diagnosis present

## 2016-09-07 DIAGNOSIS — Z1239 Encounter for other screening for malignant neoplasm of breast: Secondary | ICD-10-CM

## 2016-09-07 LAB — POCT UA - MICROALBUMIN
CREATININE, POC: 50 mg/dL
MICROALBUMIN (UR) POC: 30 mg/L

## 2016-09-07 LAB — GLUCOSE, POCT (MANUAL RESULT ENTRY)
POC GLUCOSE: 279 mg/dL — AB (ref 70–99)
POC Glucose: 316 mg/dl — AB (ref 70–99)

## 2016-09-07 LAB — POCT GLYCOSYLATED HEMOGLOBIN (HGB A1C): Hemoglobin A1C: 10.3

## 2016-09-07 MED ORDER — TRUEPLUS LANCETS 28G MISC: 1.0000 | Freq: Once | 12 refills | Status: AC

## 2016-09-07 MED ORDER — INSULIN ASPART 100 UNIT/ML ~~LOC~~ SOLN: 10.0000 [IU] | Freq: Once | SUBCUTANEOUS | Status: DC

## 2016-09-07 MED ORDER — LOSARTAN POTASSIUM 50 MG PO TABS: 50.0000 mg | ORAL_TABLET | Freq: Every day | ORAL | 2 refills | Status: DC

## 2016-09-07 MED ORDER — ASPIRIN 81 MG PO TBEC
81.0000 mg | DELAYED_RELEASE_TABLET | Freq: Every day | ORAL | 11 refills | Status: DC
Start: 2016-09-07 — End: 2017-04-15

## 2016-09-07 MED ORDER — HYDROCHLOROTHIAZIDE 25 MG PO TABS: 25.0000 mg | ORAL_TABLET | Freq: Every day | ORAL | 2 refills | Status: DC

## 2016-09-07 MED ORDER — TRUE METRIX METER W/DEVICE KIT: 1.0000 | PACK | Freq: Once | 0 refills | Status: AC

## 2016-09-07 MED ORDER — METFORMIN HCL 1000 MG PO TABS: 1000.0000 mg | ORAL_TABLET | Freq: Two times a day (BID) | ORAL | 2 refills | Status: DC

## 2016-09-07 MED ORDER — GLIPIZIDE 5 MG PO TABS: 5.0000 mg | ORAL_TABLET | Freq: Every day | ORAL | 2 refills | Status: DC

## 2016-09-07 MED ORDER — GLUCOSE BLOOD VI STRP: ORAL_STRIP | 12 refills | Status: DC

## 2016-09-07 NOTE — Progress Notes (Signed)
Patient is here for f/up   Patient been without med for more than a week

## 2016-09-07 NOTE — Progress Notes (Signed)
Subjective:  Patient ID: Amanda Duarte, female    DOB: December 27, 1955  Age: 61 y.o. MRN: [id number]  CC: Hypertension and Diabetes   HPI Amanda Duarte presents for HTN and DM . She reports being without her medications for 1 week. History of DM. Symptoms: none. Patient denies foot ulcerations, nausea, paresthesia of the feet, polydipsia, polyuria, visual disturbances and vomitting.  Evaluation to date has been included: fasting blood sugar and microalbuminuria.  Home sugars: patient does not check sugars. Treatment to date: metfromin.History of HTN.  She is not exercising and is not adherent to low salt diet.  She does not check BP at home. Cardiac symptoms none. Patient denies chest pain, chest pressure/discomfort, claudication, dyspnea, lower extremity edema, near-syncope, palpitations and syncope.  Cardiovascular risk factors: diabetes mellitus, hypertension, obesity (BMI >= 30 kg/m2) and sedentary lifestyle. Use of agents associated with hypertension: none. History of target organ damage: none.   Outpatient Medications Prior to Visit  Medication Sig Dispense Refill  . aspirin EC 81 MG EC tablet Take 1 tablet (81 mg total) by mouth daily.    . famotidine (PEPCID) 20 MG tablet Take 1 tablet (20 mg total) by mouth 2 (two) times daily. 60 tablet 1  . phenazopyridine (PYRIDIUM) 200 MG tablet Take 1 tablet (200 mg total) by mouth 3 (three) times daily. 6 tablet 0  . PRESCRIPTION MEDICATION Take 1 tablet by mouth daily. For high blood pressure. Sample from MD. Unknown name/strength.    [name] PRESCRIPTION MEDICATION Take 1 tablet by mouth daily. For diabetes. Sample from MD. Unknown name/strength.     No facility-administered medications prior to visit.     ROS Review of Systems  Constitutional: Negative.   Eyes: Negative.   Respiratory: Negative.   Cardiovascular: Negative.   Gastrointestinal: Negative.   Musculoskeletal: Negative.   Skin: Negative.   Neurological: Negative.     Psychiatric/Behavioral: Negative.     Objective:  BP (!) 169/75 (BP Location: Left Arm, Patient Position: Sitting, Cuff Size: Normal)   Pulse (!) 58   Temp 97.8 F (36.6 C) (Oral)   Resp 18   Ht 4' 11"  (1.499 m)   Wt 135 lb 6.4 oz (61.4 kg)   SpO2 97%   BMI 27.35 kg/m   BP/Weight 09/07/2016 04/04/2016 0/96/2836  Systolic BP 629 476 546  Diastolic BP 75 59 67  Wt. (Lbs) 135.4 - -  BMI 27.35 - -   Physical Exam  Constitutional: She is oriented to person, place, and time. She appears well-developed and well-nourished.  Eyes: Pupils are equal, round, and reactive to light. Conjunctivae are normal.  Neck: Normal range of motion. Neck supple. No JVD present.  Cardiovascular: Normal rate, regular rhythm, normal heart sounds and intact distal pulses.   Pulmonary/Chest: Effort normal and breath sounds normal.  Abdominal: Soft. Bowel sounds are normal. There is no tenderness.  Neurological: She is alert and oriented to person, place, and time. No sensory deficit.  Skin: Skin is warm and dry.  Nursing note and vitals reviewed.  Diabetic Foot Exam - Simple   Simple Foot Form Diabetic Foot exam was performed with the following findings:  Yes 09/07/2016 11:02 AM  Visual Inspection No deformities, no ulcerations, no other skin breakdown bilaterally:  Yes Sensation Testing Intact to touch and monofilament testing bilaterally:  Yes Pulse Check Posterior Tibialis and Dorsalis pulse intact bilaterally:  Yes Comments    Assessment & Plan:   Problem List Items Addressed This Visit  Cardiovascular and Mediastinum   HTN (hypertension)now controlled   Relevant Medications   rosuvastatin (CRESTOR) 20 MG tablet   losartan (COZAAR) 50 MG tablet   hydrochlorothiazide (HYDRODIURIL) 25 MG tablet   aspirin 81 MG EC tablet   Other Relevant Orders   Glucose (CBG) (Completed)   HgB A1c (Completed)   Lipid Panel (Completed)   CMP14+EGFR (Completed)    Other Visit Diagnoses     Uncontrolled type 2 diabetes mellitus with hyperglycemia, without long-term current use of insulin (HCC)    -  Primary   Glipizide added for better glycemic control. Start checking CBG BID. Bring glucometer/log to next visit.   Follow up with clinical pharmacist   Follow up with PCP in 3 months.   Relevant Medications   rosuvastatin (CRESTOR) 20 MG tablet   glipiZIDE (GLUCOTROL) 5 MG tablet   metFORMIN (GLUCOPHAGE) 1000 MG tablet   losartan (COZAAR) 50 MG tablet   aspirin 81 MG EC tablet   insulin aspart (novoLOG) injection 10 Units   glucose blood test strip   Other Relevant Orders   Lipid Panel (Completed)   CMP14+EGFR (Completed)   POCT UA - Microalbumin (Completed)   Glucose (CBG) (Completed)   Healthcare maintenance       Relevant Orders   MM SCREENING BREAST TOMO BILATERAL   Ambulatory referral to Gastroenterology   Screening cholesterol level       Relevant Orders   Lipid Panel (Completed)   Screening for breast cancer      Relevant Orders  MM SCREENING BREAST TOMO BILATERAL        Meds ordered this encounter  Medications  . glipiZIDE (GLUCOTROL) 5 MG tablet    Sig: Take 1 tablet (5 mg total) by mouth daily before breakfast.    Dispense:  30 tablet    Refill:  2    Order Specific Question:   Supervising Provider    Answer:   Tresa Garter [id number]  . metFORMIN (GLUCOPHAGE) 1000 MG tablet    Sig: Take 1 tablet (1,000 mg total) by mouth 2 (two) times daily with a meal.    Dispense:  60 tablet    Refill:  2    Order Specific Question:   Supervising Provider    Answer:   Tresa Garter [id number]  . losartan (COZAAR) 50 MG tablet    Sig: Take 1 tablet (50 mg total) by mouth daily.    Dispense:  30 tablet    Refill:  2    Order Specific Question:   Supervising Provider    Answer:   Tresa Garter [id number]  . hydrochlorothiazide (HYDRODIURIL) 25 MG tablet    Sig: Take 1 tablet (25 mg total) by mouth daily.    Dispense:  30 tablet    Refill:   2    Order Specific Question:   Supervising Provider    Answer:   Tresa Garter [id number]  . aspirin 81 MG EC tablet    Sig: Take 1 tablet (81 mg total) by mouth daily.    Dispense:  30 tablet    Refill:  11    Order Specific Question:   Supervising Provider    Answer:   Tresa Garter [id number]  . insulin aspart (novoLOG) injection 10 Units  . glucose blood test strip    Sig: Use as instructed    Dispense:  100 each    Refill:  12    Order Specific Question:   Supervising Provider  AnswerTresa Garter [id number]  . TRUEPLUS LANCETS 28G MISC    Sig: 1 kit by Does not apply route once.    Dispense:  100 each    Refill:  12    Order Specific Question:   Supervising Provider    Answer:   Tresa Garter [id number]  . Blood Glucose Monitoring Suppl (TRUE METRIX METER) w/Device KIT    Sig: 1 Device by Does not apply route once.    Dispense:  1 kit    Refill:  0    Order Specific Question:   Supervising Provider    Answer:   Tresa Garter [id number]    Follow-up: Return in about 2 weeks (around 09/21/2016) for DM/ HTN with Stacy.   Alfonse Spruce FNP

## 2016-09-07 NOTE — Telephone Encounter (Signed)
CMA call regarding patient left her medication in the room on 09/07/2016  Patient did not answer but left a message about it

## 2016-09-07 NOTE — Patient Instructions (Addendum)
Control del nivel sanguneo de glucosa en los adultos (Blood Glucose Monitoring, Adult) El control del nivel de azcar (glucosa) en la sangre lo ayuda a tener la diabetes bajo control. Tambin ayuda a que usted y el mdico controlen si el tratamiento de la diabetes es Armed forces logistics/support/administrative officer. El control del nivel sanguneo de glucosa implica realizar controles regulares como lo indique el mdico y Catering manager registro de los resultados (registro diario). POR QU DEBO [place] DE GLUCOSA? Si controla su nivel sanguneo de glucosa con regularidad, podr:  Comprender de [employer], la actividad fsica, las enfermedades y los medicamentos inciden en los niveles sanguneos de [place].  Conocer el nivel sanguneo de glucosa en cualquier momento dado. Saber rpidamente si el nivel es bajo (hipoglucemia) o alto (hiperglucemia).  Puede ser de ayuda para que usted y el mdico sepan cmo ajustar los medicamentos. [place] DE GLUCOSA? Siga las indicaciones del mdico acerca de la frecuencia con la que debe controlar el nivel sanguneo de glucosa. La frecuencia puede depender de:  El tipo de diabetes que tenga.  Si su diabetes est bajo control.  Los medicamentos que toma. Si usted tiene diabetes tipo 1:  Controle su nivel sanguneo de glucosa al [employer] al da.  Tambin controle su nivel sanguneo de glucosa: ? Antes de cada inyeccin de insulina. ? Antes y despus de hacer ejercicio. ? [place] comidas. ? Dos horas despus de una comida. ? Ocasionalmente, entre las 2:00a.m. y las 3:00a.m., como se lo hayan indicado. ? Antes de Optometrist tareas peligrosas, English as a second language teacher o usar maquinaria pesada. ? A la hora de acostarse.  Es posible que Geophysicist/field seismologist con ms frecuencia los niveles sanguneos de [place], [place] 6 a 10veces por da: ? Si [place] una bomba de [place]. ? Si necesita varias inyecciones diarias. ? Si su diabetes no est bien  controlada. ? Si est enfermo. ? Si tiene antecedentes de hipoglucemia grave. ? Si tiene antecedentes de no darse cuenta cundo est bajando su nivel sanguneo de glucosa (hipoglucemia asintomtica). Si usted tiene diabetes tipo 2:  Si recibe insulina u otro medicamento para la diabetes, controle el nivel sanguneo de glucosa al [employer] veces al da.  Mientras reciba tratamiento intensivo con insulina, debe medirse el nivel sanguneo de glucosa al menos 4veces al [employer]. Ocasionalmente, es posible que deba controlarse entre las 2:00a.m. y las 3:00a.m., segn se lo indiquen.  Tambin controle su nivel sanguneo de glucosa: ? Antes y despus de hacer ejercicio. ? Antes de Optometrist tareas peligrosas, English as a second language teacher o usar maquinaria pesada.  Es posible que Geophysicist/field seismologist con ms frecuencia los niveles sanguneos de glucosa si: ? Es necesario ajustar la dosis de sus medicamentos. ? Su diabetes no est bien controlada. ? Est enfermo. QU ES UN REGISTRO [place] DE GLUCOSA?  Un registro diario es un registro de los valores de glucosa en la [place]. Puede ayudarles a usted y a su mdico a: ? Health visitor en su nivel sanguneo de glucosa durante el transcurso del [place]. ? Ajustar su plan de control de la diabetes como sea necesario.  Cada vez que controle su nivel sanguneo de glucosa, anote el resultado y aquellos factores que pueden estar afectando su nivel sanguneo de glucosa, como la dieta y la actividad fsica Designer, multimedia.  La State Farm de los medidores de glucosa guardan un registro de las lecturas realizadas con el medidor. Algunos permiten descargar sus registros en  una computadora.  [place] DE GLUCOSA? Siga los siguientes pasos para obtener lecturas precisas de su glucemia: Materiales necesarios  Medidor de Printmaker.  Tiras reactivas para el medidor. Cada medidor tiene sus propias tiras reactivas. Debe usar  las tiras reactivas que trae su medidor.  Una aguja para pincharse el dedo (lanceta). No utilice la misma lanceta en ms de una ocasin.  Un dispositivo que sujeta la lanceta (dispositivo de puncin).  Un diario o libro de anotaciones para [employer]. Procedimiento  [employer] con agua y [place].  Pnchese el costado del dedo (no la punta) con Retail buyer. Use un dedo diferente cada vez.  Frote suavemente el dedo [employer] aparezca una pequea gota de [place].  Siga las instrucciones que vienen con el medidor para Garment/textile technologist tira Comptroller, Midwife la sangre sobre la tira y usar el medidor de Printmaker.  Registre el resultado y las observaciones que desee. Zonas del cuerpo alternativas para realizar las pruebas  Algunos medidores le permiten tomar sangre para la prueba de otras zonas del cuerpo que no son el dedo (zonas alternativas).  Si cree que tiene hipoglucemia o si tiene hipoglucemia asintomtica, no utilice las zonas alternativas del cuerpo. En su lugar, use los dedos.  Es posible que las zonas alternativas no sean tan precisas como los dedos porque el flujo de sangre es ms lento en esas zonas. Esto significa que el resultado que obtiene de estas zonas puede estar retrasado y ser un poco diferente del resultado que obtendra del dedo.  Los sitios alternativos ms comunes son los siguientes: ? Los antebrazos. ? Los muslos. ? La palma de [employer]. Consejos adicionales  Siempre tenga los insumos a mano.  Todos los medidores de glucosa incluyen un nmero de telfono "directo", disponible las 24 horas, al que podr llamar si tiene preguntas o [place]. Tambin puede consultar a su mdico.  Despus de usar algunas cajas de tiras reactivas, ajuste (calibre) el medidor de glucemia segn las instrucciones del medidor. Esta informacin no tiene Marine scientist el consejo del mdico. Asegrese de hacerle al mdico cualquier pregunta que  tenga. Document Released: 01/10/2005 Document Revised: 05/04/2015 Document Reviewed: 06/22/2015 Elsevier Interactive Patient Education  2017 [place] diabetes mellitus y los alimentos (Diabetes Mellitus and Food) Es importante que controle su nivel de azcar en la sangre (glucosa). El nivel de glucosa en sangre depende en gran medida de lo que usted come. Comer alimentos saludables en las cantidades [place] a lo largo del Training and development officer, aproximadamente a la misma hora [employer], lo ayudar a Chief Technology Officer su nivel de Multimedia programmer. Tambin puede ayudarlo a retrasar o Patent attorney de la diabetes mellitus. Comer de [employer] saludable incluso puede ayudarlo a Chartered loss adjuster de presin arterial y a Science writer o Theatre manager un peso saludable. Entre las recomendaciones generales para alimentarse y Audiological scientist los alimentos de forma saludable, se incluyen las siguientes:  Respetar las comidas principales y comer colaciones con regularidad. Evitar pasar largos perodos sin comer con el fin de perder peso.  Seguir una dieta que consista principalmente en alimentos de origen vegetal, como frutas, vegetales, frutos secos, legumbres y cereales integrales.  Utilizar mtodos de coccin a baja temperatura, como hornear, en lugar de mtodos de coccin a alta temperatura, como frer en abundante aceite. Trabaje con el nutricionista para aprender a Financial planner nutricional de las etiquetas de los alimentos.  CMO PUEDEN AFECTARME LOS ALIMENTOS? Carbohidratos Los carbohidratos afectan el nivel de glucosa en sangre ms que cualquier otro tipo de alimento. El nutricionista lo ayudar a Teacher, adult education cuntos carbohidratos puede consumir en cada comida y ensearle a contarlos. El recuento de carbohidratos es importante para mantener la glucosa en sangre en un nivel saludable, en especial si utiliza insulina o toma determinados medicamentos para la diabetes mellitus. Alcohol El alcohol puede provocar  disminuciones sbitas de la glucosa en sangre (hipoglucemia), en especial si utiliza insulina o toma determinados medicamentos para la diabetes mellitus. La hipoglucemia es una afeccin que puede poner en peligro la vida. Los sntomas de la hipoglucemia (somnolencia, mareos y Data processing manager) son similares a los sntomas de haber consumido mucho alcohol. Si el mdico lo autoriza a beber alcohol, hgalo con moderacin y siga estas pautas:  Las mujeres no deben beber ms de un trago por da, y los hombres no deben beber ms de dos tragos por Training and development officer. Un trago es igual a: ? 12 onzas (355 ml) de cerveza ? 5 onzas de vino (150 ml) de vino ? 1,5onzas (71ml) de bebidas espirituosas  No beba con el estmago vaco.  Mantngase hidratado. Beba agua, gaseosas dietticas o t helado sin azcar.  Las gaseosas comunes, los jugos y otros refrescos podran contener muchos carbohidratos y se Civil Service fast streamer. QU ALIMENTOS NO SE RECOMIENDAN? Cuando haga las elecciones de alimentos, es importante que recuerde que todos los alimentos son distintos. Algunos tienen menos nutrientes que otros por porcin, aunque podran tener la misma cantidad de caloras o carbohidratos. Es difcil darle al cuerpo lo que necesita cuando consume alimentos con menos nutrientes. Estos son algunos ejemplos de alimentos que debera evitar ya que contienen muchas caloras y carbohidratos, pero pocos nutrientes:  Physicist, medical trans (la mayora de los alimentos procesados incluyen grasas trans en la etiqueta de Informacin nutricional).  Gaseosas comunes.  Jugos.  Caramelos.  Dulces, como tortas, pasteles, rosquillas y [place].  Comidas fritas. QU ALIMENTOS PUEDO COMER? Consuma alimentos ricos en nutrientes, que nutrirn el cuerpo y lo mantendrn saludable. Los alimentos que debe comer tambin dependern de varios factores, como:  Las caloras que necesita.  Los medicamentos que toma.  Su peso.  El nivel de glucosa en [place].  El  [place] de presin arterial.  El nivel de colesterol. Debe consumir una amplia variedad de alimentos, por ejemplo:  Protenas. ? Cortes de N[employer]or Corporation. ? Protenas con bajo contenido de grasas saturadas, como pescado, clara de huevo y frijoles. Evite las carnes procesadas.  Frutas y vegetales. ? [place] y Photographer que pueden ayudar a [employer] niveles sanguneos de [place], como [place], mangos y batatas.  Productos lcteos. ? Elija productos lcteos sin grasa o con bajo contenido de [place], como [place], yogur y [place].  Cereales, panes, pastas y arroz. ? Elija cereales integrales, como panes multicereales, avena en grano y arroz integral. Estos alimentos pueden ayudar a controlar la presin arterial.  Daphene Jaeger. ? Alimentos que contengan grasas saludables, como frutos secos, Musician, aceite de [place], aceite de canola y pescado. TODOS LOS QUE PADECEN DIABETES MELLITUS TIENEN EL [place] PLAN DE [place]? Dado que todas las personas que padecen diabetes mellitus son distintas, no hay un solo plan de comidas que funcione para todos. Es muy importante que se rena con un nutricionista que lo ayudar a crear un plan de comidas adecuado para usted. Esta informacin no tiene Marine scientist el consejo del mdico. Asegrese de hacerle al mdico cualquier pregunta que tenga. Document Released: 04/19/2007  Document Revised: 01/31/2014 Document Reviewed: 12/07/2012 Elsevier Interactive Patient Education  2017 [employer].

## 2016-09-08 LAB — CMP14+EGFR
ALK PHOS: 108 IU/L (ref 39–117)
ALT: 26 IU/L (ref 0–32)
AST: 35 IU/L (ref 0–40)
Albumin/Globulin Ratio: 1.3 (ref 1.2–2.2)
Albumin: 4.3 g/dL (ref 3.6–4.8)
BUN/Creatinine Ratio: 13 (ref 12–28)
BUN: 9 mg/dL (ref 8–27)
Bilirubin Total: 0.4 mg/dL (ref 0.0–1.2)
CO2: 23 mmol/L (ref 20–29)
CREATININE: 0.69 mg/dL (ref 0.57–1.00)
Calcium: 9.4 mg/dL (ref 8.7–10.3)
Chloride: 98 mmol/L (ref 96–106)
GFR calc Af Amer: 109 mL/min/{1.73_m2} (ref >59)
GFR calc non Af Amer: 94 mL/min/{1.73_m2} (ref >59)
GLUCOSE: 261 mg/dL — AB (ref 65–99)
Globulin, Total: 3.4 g/dL (ref 1.5–4.5)
Potassium: 4 mmol/L (ref 3.5–5.2)
Sodium: 138 mmol/L (ref 134–144)
Total Protein: 7.7 g/dL (ref 6.0–8.5)

## 2016-09-08 LAB — LIPID PANEL
Chol/HDL Ratio: 3.6 ratio (ref 0.0–4.4)
Cholesterol, Total: 196 mg/dL (ref 100–199)
HDL: 54 mg/dL (ref >39)
LDL CALC: 82 mg/dL (ref 0–99)
Triglycerides: 300 mg/dL — ABNORMAL HIGH (ref 0–149)
VLDL CHOLESTEROL CAL: 60 mg/dL — AB (ref 5–40)

## 2016-09-19 ENCOUNTER — Other Ambulatory Visit: Payer: Self-pay | Admitting: Family Medicine

## 2016-09-19 DIAGNOSIS — E781 Pure hyperglyceridemia: Secondary | ICD-10-CM

## 2016-09-19 MED ORDER — ATORVASTATIN CALCIUM 20 MG PO TABS: 20.0000 mg | ORAL_TABLET | Freq: Every day | ORAL | 2 refills | Status: DC

## 2016-09-20 ENCOUNTER — Telehealth: Payer: Self-pay

## 2016-09-20 NOTE — Telephone Encounter (Signed)
-----   Message from Alfonse Spruce, [place] sent at 09/19/2016  7:17 PM EDT ----- Lipid levels were elevated. This can increase your risk of heart disease overtime. You will be prescribed atorvastatin to help lower risk. Discontinue taking rosuvastatin.   Start eating a diet low in saturated fat. Limit your intake of fried foods, red meats, and whole milk. Increase physical activity. Kidney function normal Liver function normal Labs that evaluated your fluid and electrolyte balance are normal.

## 2016-09-20 NOTE — Telephone Encounter (Signed)
CMA call regarding lab results   Patient verify DOB  Patient was aware and understood  

## 2016-09-21 ENCOUNTER — Ambulatory Visit: Payer: 59 | Attending: Family Medicine | Admitting: Pharmacist

## 2016-09-21 VITALS — BP 147/72 | HR 79

## 2016-09-21 DIAGNOSIS — I10 Essential (primary) hypertension: Secondary | ICD-10-CM | POA: Diagnosis not present

## 2016-09-21 DIAGNOSIS — E119 Type 2 diabetes mellitus without complications: Secondary | ICD-10-CM | POA: Diagnosis not present

## 2016-09-21 DIAGNOSIS — E1165 Type 2 diabetes mellitus with hyperglycemia: Secondary | ICD-10-CM | POA: Diagnosis not present

## 2016-09-21 DIAGNOSIS — Z7984 Long term (current) use of oral hypoglycemic drugs: Secondary | ICD-10-CM | POA: Diagnosis not present

## 2016-09-21 DIAGNOSIS — Z79899 Other long term (current) drug therapy: Secondary | ICD-10-CM | POA: Insufficient documentation

## 2016-09-21 MED ORDER — GLIPIZIDE ER 5 MG PO TB24: 5.0000 mg | ORAL_TABLET | Freq: Every day | ORAL | 2 refills | Status: DC

## 2016-09-21 NOTE — Patient Instructions (Addendum)
Thanks for coming to see [name]  Start glipizide extended release - this one will last all day  Come back in 2 weeks with your meter. Check you blood sugar every day when you first wake up  Try to cut back on salt and processed foods - it will help your blood pressure come down. Try to check you blood [facility] at [facility] or a local pharmacy  Plan de alimentacin DASH (DASH Eating Plan) DASH es la sigla en ingls de "Enfoques Alimentarios para Detener la Hipertensin". El plan de alimentacin DASH ha demostrado bajar la presin arterial elevada (hipertensin). Los beneficios adicionales para la salud pueden incluir la disminucin del riesgo de diabetes mellitus tipo2, enfermedades cardacas e ictus. Este plan tambin puede ayudar a Horticulturist, commercial. QU DEBO SABER ACERCA DEL PLAN DE ALIMENTACIN DASH? Para el plan de alimentacin DASH, seguir las siguientes pautas generales:  Elija los alimentos que contienen menos de 150 miligramos de sodio por porcin (segn se indica en la etiqueta de los alimentos).  Use hierbas o aderezos sin sal, en lugar de sal de mesa o sal marina.  Consulte al mdico o farmacutico antes de usar sustitutos de la sal.  Consuma los productos con menor contenido de sodio. Estos productos suelen estar etiquetados como "bajo en sodio" o "sin agregado de sal".  Coma alimentos frescos. No consuma una gran cantidad de alimentos enlatados.  Coma ms verduras, frutas y productos lcteos con bajo contenido de [place].  Elija los cereales integrales. Busque la palabra "integral" en Equities trader de la lista de ingredientes.  Elija el pescado y el pollo o el pavo sin piel ms a menudo que las carnes rojas. Limite el consumo de pescado, carne de ave y carne a 6onzas (170g) por Training and development officer.  Limite el consumo de dulces, postres, azcares y bebidas azucaradas.  Elija las grasas saludables para el corazn.  Consuma ms comida casera y menos de restaurante, de buf y comida  rpida.  Limite el consumo de alimentos fritos.  No fra los alimentos. A la hora de cocinarlos, opte por hornearlos, hervirlos, grillarlos y asarlos a Administrator, arts.  Cuando coma en un restaurante, pida que preparen su comida con menos sal o, en lo posible, sin nada de sal. QU ALIMENTOS PUEDO COMER? Pida ayuda a un nutricionista para conocer las necesidades calricas individuales. Cereales Pan de salvado o integral. Arroz integral. Pastas de salvado o integrales. Quinua, trigo burgol y cereales integrales. Cereales con bajo contenido de sodio. Tortillas de harina de maz o de salvado. Pan de maz integral. Galletas saladas integrales. Galletas con bajo contenido de [place]. Vegetales Verduras frescas o congeladas (crudas, al vapor, asadas o grilladas). Jugos de tomate y verduras con contenido bajo o reducido de sodio. Pasta y salsa de tomate con contenido bajo o [place]. Verduras enlatadas con bajo contenido de sodio o reducido de sodio. Lambert Mody Lambert Mody frescas, en conserva (en su jugo natural) o frutas congeladas. Carnes y otros productos con protenas Carne de res molida (al 85% o ms [place]), carne de res de animales alimentados con pastos o carne de res sin la grasa. Pollo o pavo sin piel. Carne de pollo o de [place]. Cerdo sin la grasa. Todos los pescados y frutos de mar. Huevos. Porotos, guisantes o lentejas secos. Frutos secos y semillas sin sal. Frijoles enlatados sin sal. Lcteos Productos lcteos con bajo contenido de grasas, como [place] o al 1%, quesos reducidos en grasas o al 2%, ricota con bajo contenido de [place]  o [employer], o yogur natural con bajo contenido de [place]. Quesos con contenido bajo o reducido de sodio. Grasas y Naval architect en barra que no contengan grasas trans. [place] y alios para ensaladas livianos o reducidos en grasas (reducidos en sodio). [place]. Aceites de crtamo, oliva o canola. [place] natural de man o  almendra. Otros Palomitas de maz y pretzels sin sal. Los artculos mencionados arriba pueden no ser Dean Foods Company de las bebidas o los alimentos recomendados. Comunquese con el nutricionista para conocer ms opciones. QU ALIMENTOS NO SE RECOMIENDAN? Cereales Pan blanco. Pastas blancas. Arroz blanco. Pan de maz refinado. Bagels y croissants. Galletas saladas que contengan grasas trans. Vegetales Vegetales con crema o fritos. Verduras en [place]. Verduras enlatadas comunes. Pasta y salsa de tomate en lata comunes. Jugos comunes de tomate y de verduras. Lambert Mody Fruta enlatada en almbar liviano o espeso. Jugo de frutas. Carnes y otros productos con protenas [place] de carne con Lobbyist. Costillas, alas de pollo, tocineta, salchicha, mortadela, salame, chinchulines, tocino, perros calientes, salchichas alemanas y embutidos envasados. Frutos secos y semillas con sal. Frijoles con sal en lata. Lcteos Leche entera o al 2%, crema, mezcla de [place] y crema, y queso crema. Yogur entero o endulzado. Quesos o queso azul con alto contenido de Physicist, medical. [place] no lcteas y coberturas batidas. Quesos procesados, quesos para untar o cuajadas. Condimentos Sal de cebolla y ajo, sal condimentada, sal de mesa y sal marina. Salsas en lata y envasadas. Salsa [place]. Salsa trtara. Salsa barbacoa. Salsa teriyaki. Salsa de soja, incluso la que tiene contenido reducido de [place]. Salsa de carne. Salsa de pescado. Salsa de [place]. Salsa rosada. Rbano picante. Ketchup y mostaza. Saborizantes y tiernizantes para carne. Caldo en cubitos. Salsa picante. Salsa tabasco. Adobos. Aderezos para tacos. Salsas. Grasas y aceites [place], [place] en barra, [place] de [place], [place], [place] clarificada y Wendee Copp de tocino. Aceites de coco, de palmiste o de palma. Aderezos comunes para ensalada. Otros Pickles y [place]. Palomitas de maz y pretzels con sal. Los artculos mencionados arriba pueden no ser  [employer] Foods Company de las bebidas y los alimentos que se Higher education careers adviser. Comunquese con el nutricionista para obtener ms informacin. DNDE Dolan Amen MS INFORMACIN? [place], del Pulmn y de Herbalist (National Heart, Lung, and [place]): [email or web address].com Esta informacin no tiene Marine scientist el consejo del mdico. Asegrese de hacerle al mdico cualquier pregunta que tenga. Document Released: 12/30/2010 Document Revised: 05/04/2015 Document Reviewed: 11/14/2012 Elsevier Interactive Patient Education  2017 [place] de carbohidratos para la diabetes mellitus en los adultos Carbohydrate Counting for Diabetes Mellitus, Adult El recuento de carbohidratos es un mtodo destinado a Catering manager un registro de la cantidad de carbohidratos que se ingieren. La ingesta natural de carbohidratos aumenta la cantidad de azcar (glucosa) en la sangre. El recuento de la cantidad de carbohidratos que se ingieren sirve para que el nivel de glucosa sangunea (glucemia) permanezca dentro de los lmites normales, lo que ayuda a Theatre manager la diabetes (diabetes mellitus) bajo control. Es importante saber la cantidad de carbohidratos que se pueden ingerir en cada comida sin correr Engineer, manufacturing. Esto es Psychologist, forensic. Un especialista en alimentacin y nutricin (nutricionista certificado) puede ayudarlo a crear un plan de alimentacin y a calcular la cantidad de carbohidratos que debe ingerir en cada comida y colacin. Los siguientes alimentos incluyen carbohidratos:  Granos, como panes y cereales.  Frijoles secos y productos con soja.  Verduras con almidn, [place]  papas, guisantes y maz.  Lambert Mody y jugos de frutas.  Leche y Estate agent.  Dulces y colaciones, como pasteles, galletas, caramelos, papas fritas y refrescos.  Cmo se calculan los carbohidratos? Hay dos maneras de calcular los carbohidratos de los alimentos.  Puede usar cualquiera de los dos mtodos o [place] combinacin de [place]. Leer la etiqueta de informacin nutricional de los alimentos envasados La lista de informacin nutricional est incluida en las etiquetas de casi todas las bebidas y los alimentos envasados de los [place]. Incluye lo siguiente:  El tamao de la porcin.  Informacin sobre los nutrientes de cada porcin, incluidos los gramos (g) de carbohidratos por porcin.  Para usar la informacin nutricional:  Decida cuntas porciones va a comer.  Multiplique la cantidad de porciones por el nmero de carbohidratos por porcin.  El resultado es la cantidad total de carbohidratos que comer.  Conocer los tamaos de las porciones estndar de otros alimentos Cuando coma alimentos que contienen carbohidratos que no estn envasados o no incluyen la informacin nutricional en la etiqueta, debe medir las porciones para poder calcular la cantidad de carbohidratos:  Mida los alimentos que comer con una balanza de alimentos o una taza medidora, si es necesario.  Decida cuntas porciones de International aid/development worker.  Multiplique el nmero de porciones por15. La mayora de los alimentos con alto contenido de carbohidratos contienen unos 15g de carbohidratos por porcin. ? Por ejemplo, si come 8onzas (170g) de fresas, habr comido 2porciones y 30g de carbohidratos (2porciones x 15g=30g).  En el caso de las comidas que contienen mezclas de ms de un alimento, como las sopas y los guisos, debe calcular los carbohidratos de cada alimento que se incluye.  La siguiente Valero Energy tamaos de las porciones estndar de los alimentos corrientes con alto contenido de carbohidratos. Cada una de estas porciones tiene aproximadamente 15g de carbohidratos:  pan de hamburguesa o muffin ingls.  onza (72ml) de almbar.  onza (14g) de gelatina.  1rebanada de pan.  1tortilla de seispulgadas.  3onzas (85g) de arroz  o pasta cocidos.  4onzas (113g) de frijoles secos cocidos.  4onzas (113g) de verduras con almidn, como guisantes, maz o papas.  4onzas (113g) de cereal caliente.  4onzas (113g) de pur de papas o de una papa grande al horno.  4onzas (113g) de frutas en lata o congeladas.  4onzas (113ml) de jugo de frutas.  4a 6galletas.  6croquetas de pollo.  6onzas (170g) de cereales secos sin azcar.  6onzas (170g) de yogur descremado sin ningn agregado o de yogur endulzado con edulcorante artificial.  8onzas (258ml) de [place].  8onzas (170g) de frutas frescas o una fruta pequea.  24onzas (680g) de palomitas de maz.  Ejemplo de recuento de carbohidratos Ejemplo de comida  3onzas (85g) de pechuga de pollo.  6onzas (170g) de arroz integral.  4onzas (113g) de maz.  8onzas (244ml) de leche.  8onzas (170g) de fresas con crema batida sin azcar. Clculo de carbohidratos 1. Identifique los alimentos que contienen carbohidratos: ? Arroz. ? Maz. ? Leche. ? Hughie Closs. 2. Calcule cuntas porciones come de cada alimento: ? 2 porciones de arroz. ? 1 porcin de maz. ? 1 porcin de leche. ? 1 porcin de fresas. 3. Multiplique cada nmero de porciones por 15g: ? 2 porciones de arroz x 15 g = 30 g. ? 1 porcin de maz x 15 g = 15 g. ? 1 porcin de leche x 15 g = 15 g. ? 1 porcin de fresas x 15 g =  15 g. 4. Sume todas las cantidades para conocer el total de gramos de carbohidratos consumidos: ? 30g + 15g + 15g + 15g = 75g de carbohidratos en total. Esta informacin no tiene como fin reemplazar el consejo del mdico. Asegrese de hacerle al mdico cualquier pregunta que tenga. Document Released: 04/04/2011 Document Revised: 12/29/2015 Document Reviewed: 06/24/2015 Elsevier Interactive Patient Education  [employer].

## 2016-09-21 NOTE — Progress Notes (Signed)
    S:     Chief Complaint  Patient presents with  . Medication Management    Patient arrives in good spirits.  Presents for diabetes evaluation, education, and management at the request of Fredia Beets, NP. Patient was referred on 09/07/16.  Patient was last seen by Primary Care Provider on 09/07/16. Patient declined interpreter.  Patient reports adherence with medications.  Current diabetes medications include: metformin 1000 mg BID, glipizide 5 mg daily.  Current hypertension medications include: losartan 50 mg daily, HCTZ 25 mg daily   Patient denies hypoglycemic events.  Patient reported dietary habits: doesn't follow any particular diet  Patient reported exercise habits: none   O:  Physical Exam   ROS   Lab Results  Component Value Date   HGBA1C 10.3 09/07/2016   There were no vitals filed for this visit.  Home fasting CBG: 135-160s, 300 2 hour post-prandial/random CBG: none   A/P: Diabetes longstanding currently uncontrolled based on A1c of 10.3. Patient denies hypoglycemic events and is able to verbalize appropriate hypoglycemia management plan. Patient reports adherence with medication. Control is suboptimal due to dietary indiscretion and sedentary lifestyle.  Continue metformin 1000 mg BID. Switch glipizide to extended release so that patient has all day coverage. Will titrate up as needed but patient may need additional therapy. Instructed patient to monitor blood glucose daily and to bring in her log or meter to the next visit as this will help to guide therapy.  Of note, patient has [place] and [place] are the preferred agents. If insulin is ever needed, Lantus is NOT covered and Basaglar or Levemir are preferred.  Next A1C anticipated November 2018.    ASCVD risk greater than 7.5%. Continued Aspirin 81 mg and Continued atorvastatin 20 mg.   Hypertension longstanding currently >140/90.  Patient reports adherence with medication. Control is  suboptimal due to dietary indiscretion (high salt intake). Provided information on the DASH diet in Spanish to see if patient is able to lower blood pressure by cutting down on salt intake. If still elevated at next visit in 2 weeks, will plan to increase losartan to 100 mg daily.  Written patient instructions provided.  Total time in face to face counseling 15 minutes.   Follow up in Pharmacist Clinic Visit in 2 weeks.   Patient seen with Waverly Ferrari, PharmD Candidate

## 2016-10-04 ENCOUNTER — Ambulatory Visit: Payer: 59 | Attending: Family Medicine | Admitting: Pharmacist

## 2016-10-04 DIAGNOSIS — Z79899 Other long term (current) drug therapy: Secondary | ICD-10-CM | POA: Diagnosis not present

## 2016-10-04 DIAGNOSIS — R42 Dizziness and giddiness: Secondary | ICD-10-CM | POA: Insufficient documentation

## 2016-10-04 DIAGNOSIS — E1165 Type 2 diabetes mellitus with hyperglycemia: Secondary | ICD-10-CM | POA: Diagnosis not present

## 2016-10-04 DIAGNOSIS — E119 Type 2 diabetes mellitus without complications: Secondary | ICD-10-CM | POA: Diagnosis not present

## 2016-10-04 DIAGNOSIS — Z7984 Long term (current) use of oral hypoglycemic drugs: Secondary | ICD-10-CM | POA: Insufficient documentation

## 2016-10-04 NOTE — Patient Instructions (Addendum)
Thanks for coming to see Amanda Duarte!  Come back in 1 week with all of your pill bottles  Call your insurance plan to see what dentists are covered. You do not have to have a referral for a dentist. You have dental insurance  Morning  Evening  Metformin - 1 tablet Metformin - 1 tablet  Glipizide - 1 tablet

## 2016-10-04 NOTE — Progress Notes (Signed)
    S:     Chief Complaint  Patient presents with  . Medication Management    Patient arrives in good spirits.  Presents for diabetes evaluation, education, and management at the request of Fredia Beets, NP. Patient was referred on 09/07/16.  Patient was last seen by Primary Care Provider on 09/07/16. [place] interpreter Shanon Brow [phone] was used for the visit.  Patient reports adherence with medications. However, she reports that she only takes 2 tablets a day for her diabetes and is unable to elaborate on what those two pills are. She will sometimes feel dizzy after taking one of them so she will cut it in half. She doesn't know what medication it is.  Current diabetes medications include: metformin 1000 mg BID, glipizide XL 5 mg daily.   Current hypertension medications include: losartan 50 mg daily, HCTZ 25 mg daily   Patient denies hypoglycemic events. It is unclear if the dizziness is due to lower blood sugars as she does not always check her blood sugars and she thinks it is her blood pressure (but she does not check blood pressure at home).  Patient reported dietary habits: has been trying to eat less sugary foods.  Patient reported exercise habits: none  Patient would like a referral to a dentist.   O:  Physical Exam   ROS   Lab Results  Component Value Date   HGBA1C 10.3 09/07/2016   There were no vitals filed for this visit.  Home fasting CBG: 120s-160s 2 hour post-prandial/random CBG: 170s-260s   A/P: Diabetes longstanding currently uncontrolled based on A1c of 10.3 but improving based on home readings. Patient denies hypoglycemic events and is able to verbalize appropriate hypoglycemia management plan. Patient reports adherence with medication. Control is suboptimal due to dietary indiscretion and sedentary lifestyle.  Patient with poor health literacy. She did not bring her medications today. Provided a chart that showed each of her diabetes medications and  when she should take them. Extensively reviewed her medications with her and how to take and what they are for.  Patient to return in 1 week with all of her medication bottles so we can review them with her and find out what she is actually taking.  Patient with dental insurance and was instructed to call insurance to see what dental offices will take her insurance or she can call local dental offices to see if they take her insurance.  Will follow up on blood pressure at next visit due to time and needing to see what she is actually taking.   Next A1C anticipated November 2018.    Written patient instructions provided.  Total time in face to face counseling 45 minutes.   Follow up in Pharmacist Clinic Visit in 1 weeks.   Patient seen with Waverly Ferrari, PharmD Candidate

## 2016-10-11 ENCOUNTER — Ambulatory Visit: Payer: 59 | Attending: Family Medicine | Admitting: Pharmacist

## 2016-10-11 VITALS — BP 129/73 | HR 76

## 2016-10-11 DIAGNOSIS — E119 Type 2 diabetes mellitus without complications: Secondary | ICD-10-CM | POA: Insufficient documentation

## 2016-10-11 DIAGNOSIS — E1165 Type 2 diabetes mellitus with hyperglycemia: Secondary | ICD-10-CM

## 2016-10-11 DIAGNOSIS — Z7984 Long term (current) use of oral hypoglycemic drugs: Secondary | ICD-10-CM | POA: Diagnosis not present

## 2016-10-11 DIAGNOSIS — I10 Essential (primary) hypertension: Secondary | ICD-10-CM | POA: Diagnosis not present

## 2016-10-11 MED ORDER — GLIPIZIDE ER 2.5 MG PO TB24: 2.5000 mg | ORAL_TABLET | Freq: Every day | ORAL | 2 refills | Status: DC

## 2016-10-11 NOTE — Progress Notes (Signed)
    S:     Chief Complaint  Patient presents with  . Medication Management    Patient arrives in good spirits.  Presents for diabetes evaluation, education, and management at the request of Fredia Beets, NP. Patient was referred on 09/07/16.  Patient was last seen by Primary Care Provider on 09/07/16. [place] [phone] was used for the visit.  Patient reports adherence with medications. She brings with her all of her medications but she does not have glipizide with her. She reports that she does not remember ever getting glipizide though the pharmacy records show that she picked them up a few weeks ago.  Current diabetes medications include: metformin 1000 mg BID, glipizide XL 5 mg daily.   Current hypertension medications include: losartan 50 mg daily, HCTZ 25 mg daily   Patient denies hypoglycemic events.  Patient reported dietary habits: has been trying to eat less sugary foods.  Patient reported exercise habits: none   O:  Physical Exam   ROS   Lab Results  Component Value Date   HGBA1C 10.3 09/07/2016   Vitals:   10/11/16 0920  BP: 129/73  Pulse: 76    Home fasting CBG: 140s-160s 2 hour post-prandial/random CBG: 170s-260s   A/P: Diabetes longstanding currently uncontrolled based on A1c of 10.3 but improving based on home readings. Patient denies hypoglycemic events and is able to verbalize appropriate hypoglycemia management plan. Patient reports adherence with medication. Control is suboptimal due to dietary indiscretion and sedentary lifestyle.  Patient with poor health literacy. Per her report, she has never taken the glipizide but then at the end of the visit, she pulled out an old bottle of glipizide 5 mg immediate release with only a few pills missing. Due to poor health literacy and fastings of 140-160 without glipizide, will lower starting dose of glipizide to 2.5 mg daily (extended release ordered). Patient to discard the glipizide 5 mg  immediate release. Extensively counseled patient on all of her medications and when to take them. Provided with a table and it was filled out with patient to show her what her medications are and when she should take them. Patient to follow up with in 1 week.   History of hypertension, controlled on current medications with BP <130/80. Continue current medications as prescribed.   Next A1C anticipated November 2018.    Written patient instructions provided.  Total time in face to face counseling 20 minutes.   Follow up in Pharmacist Clinic Visit in 1 week.   Patient seen with Waverly Ferrari, PharmD Candidate

## 2016-10-11 NOTE — Patient Instructions (Signed)
Thank you for coming to see [name]  Start glipizide 2.5 mg daily  Come back in 1 week.

## 2016-10-18 ENCOUNTER — Ambulatory Visit: Payer: 59 | Attending: Family Medicine | Admitting: Pharmacist

## 2016-10-18 DIAGNOSIS — E119 Type 2 diabetes mellitus without complications: Secondary | ICD-10-CM | POA: Diagnosis not present

## 2016-10-18 DIAGNOSIS — E1165 Type 2 diabetes mellitus with hyperglycemia: Secondary | ICD-10-CM | POA: Diagnosis not present

## 2016-10-18 DIAGNOSIS — I10 Essential (primary) hypertension: Secondary | ICD-10-CM | POA: Diagnosis not present

## 2016-10-18 NOTE — Progress Notes (Signed)
    S:     Chief Complaint  Patient presents with  . Medication Management    Patient arrives in good spirits.  Presents for diabetes evaluation, education, and management at the request of Fredia Beets, NP. Patient was referred on 09/07/16.  Patient was last seen by Primary Care Provider on 09/07/16. [place] interpreter Steele Sizer [phone] was used for the visit.  Patient reports adherence with medications. She reports she is taking all of her medications as prescribed.  Current diabetes medications include: metformin 1000 mg BID, glipizide XL 2.5 mg daily.   Current hypertension medications include: losartan 50 mg daily, HCTZ 25 mg daily   Patient denies hypoglycemic events.  Patient reported dietary habits: has been trying to eat less sugary foods.  Patient reported exercise habits: none  Overall, she reports feeling much better.   O:  Physical Exam   ROS   Lab Results  Component Value Date   HGBA1C 10.3 09/07/2016   There were no vitals filed for this visit.  Home fasting CBG: 125-150s,180 2 hour post-prandial/random CBG: 150s   A/P: Diabetes longstanding currently uncontrolled based on A1c of 10.3 but improving based on home readings. Patient denies hypoglycemic events and is able to verbalize appropriate hypoglycemia management plan. Patient reports adherence with medication. Control is suboptimal due to sedentary lifestyle and poor health literacy in terms of medications.  Continue current medications as prescribed. She is close to goal and I am worried based on her health literacy that any changes will be difficult for her and may increase her risk of hypoglycemia and incorrect medication use. Patient also having a hard time getting to appointments and is unable to come back in the next few weeks for follow up if any medication changes were made. Provided extensive education on dietary changes and exercise - patient to try to walk more to help with blood sugars.  Next  A1C anticipated November 2018.    Written patient instructions provided.  Total time in face to face counseling 30 minutes.   Follow up with PCP in 2 months - patient to return sooner if she has hypoglycemia or blood sugars in the 200s.  Patient seen with Waverly Ferrari, PharmD Candidate

## 2016-10-18 NOTE — Patient Instructions (Addendum)
Thanks for coming to see Amanda Duarte  Continue current medications  Goal for blood sugar in the morning before you eat: 80 - 120 Goal for blood sugar 2 hours after you eat: 100 - 180  Come back in 2 months to see Amanda Duarte for diabetes follow up   Recuento de carbohidratos para la diabetes mellitus en los adultos Carbohydrate Counting for Diabetes Mellitus, Adult El recuento de carbohidratos es un mtodo destinado a Midwife un registro de la cantidad de carbohidratos que se ingieren. La ingesta natural de carbohidratos aumenta la cantidad de azcar (glucosa) en la sangre. El recuento de la cantidad de carbohidratos que se ingieren sirve para que el nivel de glucosa sangunea (glucemia) permanezca dentro de los lmites normales, lo que ayuda a Pharmacologist la diabetes (diabetes mellitus) bajo control. Es importante saber la cantidad de carbohidratos que se pueden ingerir en cada comida sin correr Surveyor, minerals. Esto es Government social research officer. Un especialista en alimentacin y nutricin (nutricionista certificado) puede ayudarlo a crear un plan de alimentacin y a calcular la cantidad de carbohidratos que debe ingerir en cada comida y colacin. Los siguientes alimentos incluyen carbohidratos:  Granos, como panes y cereales.  Frijoles secos y productos con soja.  Verduras con almidn, como papas, guisantes y maz.  Nils Pyle y jugos de frutas.  Leche y Dentist.  Dulces y colaciones, como pasteles, galletas, caramelos, papas fritas y refrescos.  Cmo se calculan los carbohidratos? Hay dos maneras de calcular los carbohidratos de los alimentos. Puede usar cualquiera de [address] o [place] combinacin de [place]. Leer la etiqueta de informacin nutricional de los alimentos envasados La lista de informacin nutricional est incluida en las etiquetas de casi todas las bebidas y los alimentos envasados de los [place]. Incluye lo siguiente:  El tamao de la porcin.  Informacin sobre los nutrientes  de cada porcin, incluidos los gramos (g) de carbohidratos por porcin.  Para usar la informacin nutricional:  Decida cuntas porciones va a comer.  Multiplique la cantidad de porciones por el nmero de carbohidratos por porcin.  El resultado es la cantidad total de carbohidratos que comer.  Conocer los tamaos de las porciones estndar de otros alimentos Cuando coma alimentos que contienen carbohidratos que no estn envasados o no incluyen la informacin nutricional en la etiqueta, debe medir las porciones para poder calcular la cantidad de carbohidratos:  Mida los alimentos que comer con una balanza de alimentos o una taza medidora, si es necesario.  Decida cuntas porciones de Programmer, systems.  Multiplique el nmero de porciones por15. La mayora de los alimentos con alto contenido de carbohidratos contienen unos 15g de carbohidratos por porcin. ? Por ejemplo, si come 8onzas (170g) de fresas, habr comido 2porciones y 30g de carbohidratos (2porciones x 15g=30g).  En el caso de las comidas que contienen mezclas de ms de un alimento, como las sopas y los guisos, debe calcular los carbohidratos de cada alimento que se incluye.  La siguiente [employer] tamaos de las porciones estndar de los alimentos corrientes con alto contenido de carbohidratos. Cada una de estas porciones tiene aproximadamente 15g de carbohidratos:  pan de hamburguesa o muffin ingls.  onza (38ml) de almbar.  onza (14g) de gelatina.  1rebanada de pan.  1tortilla de seispulgadas.  3onzas (85g) de arroz o pasta cocidos.  4onzas (113g) de frijoles secos cocidos.  4onzas (113g) de verduras con almidn, como guisantes, maz o papas.  4onzas (113g) de cereal caliente.  4onzas (113g) de pur de  papas o de una papa grande al horno.  4onzas (113g) de frutas en lata o congeladas.  4onzas (137m) de jugo de frutas.  4a 6galletas.  6croquetas  de pollo.  6onzas (170g) de cereales secos sin azcar.  6onzas (170g) de yogur descremado sin ningn agregado o de yogur endulzado con edulcorante artificial.  8onzas (2448m de leStrayhorn 8onzas (170g) de frutas frescas o una fruta pequea.  24onzas (680g) de palomitas de maz.  Ejemplo de recuento de carbohidratos Ejemplo de comida  3onzas (85g) de pechuga de pollo.  6onzas (170g) de arroz integral.  4onzas (113g) de maz.  8onzas (24080mde leche.  8onzas (170g) de fresas con crema batida sin azcar. Clculo de carbohidratos 1. Identifique los alimentos que contienen carbohidratos: ? Arroz. ? Maz. ? Leche. ? FreHughie Closs. Calcule cuntas porciones come de cada alimento: ? 2 porciones de arroz. ? 1 porcin de maz. ? 1 porcin de leche. ? 1 porcin de fresas. 3. Multiplique cada nmero de porciones por 15g: ? 2 porciones de arroz x 15 g = 30 g. ? 1 porcin de maz x 15 g = 15 g. ? 1 porcin de leche x 15 g = 15 g. ? 1 porcin de fresas x 15 g = 15 g. 4. Sume todas las cantidades para conocer el total de gramos de carbohidratos consumidos: ? 30g + 15g + 15g + 15g = 75g de carbohidratos en total. Esta informacin no tiene como fin reemplazar el consejo del mdico. Asegrese de hacerle al mdico cualquier pregunta que tenga. Document Released: 04/04/2011 Document Revised: 12/29/2015 Document Reviewed: 06/24/2015 Elsevier Interactive Patient Education  2018 Els[place]villeabetes mellitus y los alimentos (Diabetes Mellitus and Food) Es importante que controle su nivel de azcar en la sangre (glucosa). El nivel de glucosa en sangre depende en gran medida de lo que usted come. Comer alimentos saludables en las cantidades adeSurinamelo largo del da,Training and development officerproximadamente a la misma hora todUS Airwayso ayudar a conChief Technology Officer nivel de gluMultimedia programmerambin puede ayudarlo a retrasar o eviPatent attorney la diabetes mellitus. Comer de  manAffiliated Computer Servicesludable incluso puede ayudarlo a mejChartered loss adjuster presin arterial y a alcScience writermanTheatre manager peso saludable. Entre las recomendaciones generales para alimentarse y cocAudiological scientists alimentos de forma saludable, se incluyen las siguientes:  Respetar las comidas principales y comer colaciones con regularidad. Evitar pasar largos perodos sin comer con el fin de perder peso.  Seguir una dieta que consista principalmente en alimentos de origen vegetal, como frutas, vegetales, frutos secos, legumbres y cereales integrales.  Utilizar mtodos de coccin a baja temperatura, como hornear, en lugar de mtodos de coccin a alta temperatura, como frer en abundante aceite. Trabaje con el nutricionista para aprender a usaFinancial plannertricional de las etiquetas de los alimentos. CMO PUEDEN AFECTARME LOS ALIMENTOS? Carbohidratos Los carbohidratos afectan el nivel de glucosa en sangre ms que cualquier otro tipo de alimento. El nutricionista lo ayudar a detTeacher, adult educationntos carbohidratos puede consumir en cada comida y ensearle a contarlos. El recuento de carbohidratos es importante para mantener la glucosa en sangre en un nivel saludable, en especial si utiliza insulina o toma determinados medicamentos para la diabetes mellitus. Alcohol El alcohol puede provocar disminuciones sbitas de la glucosa en sangre (hipoglucemia), en especial si utiliza insulina o toma determinados medicamentos para la diabetes mellitus. La hipoglucemia es una afeccin que puede poner en peligro la vida. Los sntomas de la hipoglucemia (somnolencia,  mareos y Data processing manager) son similares a los sntomas de haber consumido mucho alcohol. Si el mdico lo autoriza a beber alcohol, hgalo con moderacin y siga estas pautas:  Las mujeres no deben beber ms de un trago por da, y los hombres no deben beber ms de dos tragos por Training and development officer. Un trago es igual a: ? 12 onzas (355 ml) de cerveza ? 5 onzas de vino (150 ml) de  vino ? 1,5onzas (39m) de bebidas espirituosas  No beba con el estmago vaco.  Mantngase hidratado. Beba agua, gaseosas dietticas o t helado sin azcar.  Las gaseosas comunes, los jugos y otros refrescos podran contener muchos carbohidratos y se dCivil Service fast streamer QU ALIMENTOS NO SE RECOMIENDAN? Cuando haga las elecciones de alimentos, es importante que recuerde que todos los alimentos son distintos. Algunos tienen menos nutrientes que otros por porcin, aunque podran tener la misma cantidad de caloras o carbohidratos. Es difcil darle al cuerpo lo que necesita cuando consume alimentos con menos nutrientes. Estos son algunos ejemplos de alimentos que debera evitar ya que contienen muchas caloras y carbohidratos, pero pocos nutrientes:  GPhysicist, medicaltrans (la mayora de los alimentos procesados incluyen grasas trans en la etiqueta de Informacin nutricional).  Gaseosas comunes.  Jugos.  Caramelos.  Dulces, como tortas, pasteles, rosquillas y gPetrolia  Comidas fritas. QU ALIMENTOS PUEDO COMER? Consuma alimentos ricos en nutrientes, que nutrirn el cuerpo y lo mantendrn saludable. Los alimentos que debe comer tambin dependern de varios factores, como:  Las caloras que necesita.  Los medicamentos que toma.  Su peso.  El nivel de glucosa en sCoburg  El nLa Grangede presin arterial.  El nivel de colesterol. Debe consumir una amplia variedad de alimentos, por ejemplo:  Protenas. ? Cortes de cNucor Corporation ? Protenas con bajo contenido de grasas saturadas, como pescado, clara de huevo y frijoles. Evite las carnes procesadas.  Frutas y vegetales. ? FCote d'Ivoirey vPhotographerque pueden ayudar a cIllinois Tool Worksniveles sanguneos de gBreesport como mMantee mangos y batatas.  Productos lcteos. ? Elija productos lcteos sin grasa o con bajo contenido de gThunderbird Bay como lHudson yogur y qCampbell  Cereales, panes, pastas y arroz. ? Elija cereales integrales, como panes multicereales, avena  en grano y arroz integral. Estos alimentos pueden ayudar a controlar la presin arterial.  GDaphene Jaeger ? Alimentos que contengan grasas saludables, como frutos secos, aMusician aceite de oPetronila aceite de canola y pescado. TODOS LOS QUE PADECEN DIABETES MELLITUS TIENEN EL MSpring LakePLAN DE CTeachey Dado que todas las personas que padecen diabetes mellitus son distintas, no hay un solo plan de comidas que funcione para todos. Es muy importante que se rena con un nutricionista que lo ayudar a crear un plan de comidas adecuado para usted. Esta informacin no tiene cMarine scientistel consejo del mdico. Asegrese de hacerle al mdico cualquier pregunta que tenga. Document Released: 04/19/2007 Document Revised: 01/31/2014 Document Reviewed: 12/07/2012 Elsevier Interactive Patient Education  2017 EBendenapara comer fuera de su casa si tiene diabetes (Tips for Eating Away From Home If You Have Diabetes) El control del nivel de glucemia, que tambin se conoce como azcar en la sCommerce puede ser un reto, que se complica an ms cuando uno no prepara sus propias comidas. Los siguientes consejos pueden ayudarlo a cChief Technology Officerla diabetes cuando come fuera de su casa. PLANIFICACIN Organcese si sabe que comer fuera de su casa:  Pregntele al mdico cmo sincronizar las comidas y el medicamento si est en tratamiento con insulina.  HLorayne Marek  lista de restaurantes cercanos que ofrezcan opciones saludables. Si tienen un men que pueda leer en su casa, llvelo y planifique lo que pedir con anticipacin.  Busque informacin en lnea del restaurante donde quiera comer. Muchos restaurantes de comida rpida y cadenas de restaurantes incluyen la informacin nutricional en lnea. Tenga en cuenta esta informacin para elegir las opciones ms saludables y calcular los carbohidratos de la comida.  Use un libro de recuento de carbohidratos o una aplicacin mvil para fijarse en el contenido de  carbohidratos y el tamao de porcin de lo que desea comer.  Comience a Corporate treasurer de las porciones y a Marine scientist cuntas porciones hay en una unidad. Esto le permitir calcular la cantidad de carbohidratos que puede comer. ALIMENTOS LIBRES Un "alimento libre" es cualquier alimento o bebida que contenga menos de 5g de carbohidratos por porcin. Entre los alimentos libres, se incluyen los siguientes:  Muchos vegetales.  Huevos duros.  Nueces o semillas.  Aceitunas.  Quesos.  Carnes. Estos tipos de alimentos son buenas opciones de bocadillos y en general estn disponibles en los bufs de ensaladas. Como aderezos "libres" para [place], puede usarse jugo de limn, vinagre o un aderezo de bajas caloras (con menos de 20caloras por porcin). OPCIONES PARA REDUCIR LOS CARBOHIDRATOS  Reemplace el yogur descremado endulzado por el yogur sin azcar. Tambin puede consumir yogur a base de [place], pero es conveniente una opcin sin azcar o natural, porque tiene menos contenido de carbohidratos.  Pdale al mozo que retire la canasta de pan o las papas de la mesa.  Pida frutas frescas. El buf de ensaladas a menudo ofrece frutas frescas. Evite las frutas enlatadas, ya que por lo general tienen azcar o almbar.  Pida una ensalada y cmala sin aderezo. Tambin puede crear un aderezo "libre" para ensaladas.  Pida que le [employer] alimentos. Por ejemplo, en lugar de papas fritas, pida una porcin de vegetales, como una ensalada, judas verdes o brcoli. OTROS CONSEJOS  Si [place] insulina, adminstrela una vez que la comida llegue a la mesa, as las Hotel manager.  Pregntele al mozo sobre el tamao de la porcin antes de pedir la comida y, si la porcin es ms grande de lo que usted debe consumir, pida una caja para llevarse la comida a su casa. Cuando llegue la comida, deje en el plato la cantidad que debe comer y coloque el resto en la caja para  llevar.  Considere la posibilidad de Publishing rights manager un plato principal con alguien y de pedir una ensalada como guarnicin. Esta informacin no tiene Marine scientist el consejo del mdico. Asegrese de hacerle al mdico cualquier pregunta que tenga. Document Released: 01/10/2005 Document Revised: 05/04/2015 Document Reviewed: 04/09/2013 Elsevier Interactive Patient Education  2018 [place] (Hypoglycemia) La hipoglucemia se produce cuando el nivel de azcar (glucosa) en la sangre es demasiado bajo. Los sntomas de la glucemia baja pueden incluir los siguientes:  Sentir que tiene lo siguiente: ? Apetito. ? Preocupacin o nervios (ansioso). ? Sudoracin y [employer]. ? Confusin. ? Mareos. ? Somnolencia. ? Nuseas.  Tener lo siguiente: ? Latidos cardacos acelerados. ? Dolor de [place]. ? Cambios en la visin. ? Una crisis de movimientos que no puede controlar (convulsiones). ? Pesadillas. ? Hormigueo y falta de sensibilidad (adormecimiento) alrededor de la boca, los labios o la [place].  Dificultades para hacer lo siguiente: ? Hablar. ? Prestar atencin (concentrarse). ? Moverse (coordinacin). ? Dormir.  Temblores.  Desmayos.  Molestarse con facilidad (  irritabilidad). Las personas que tienen diabetes y las que no tienen la enfermedad pueden tener la glucemia baja. El nivel bajo de glucemia en la sangre puede ocurrir rpidamente y ser Engineer, maintenance (IT). Tratamiento de la glucemia baja Generalmente, el tratamiento de la glucemia baja consiste en ingerir de inmediato un alimento o una bebida que contengan azcar. Si puede pensar con claridad y tragar de manera segura, siga la regla 15/15, que consiste en lo siguiente:  Consumir 15gramos de un hidrato de carbono de accin rpida. Algunos hidratos de carbono de accin rpida son los siguientes: ? 1pomo de glucosa en gel. ? 3comprimidos de azcar (comprimidos de glucosa). ? 6 a 8unidades de caramelos  duros. ? 4onzas (157m) de jugo de frutas. ? 4onzas (1276m de gaseosa comn (no diettica).  Contrlese la glucemia 1576mtos despus de ingerir el hidrato de carbono.  Si el nivel de glucosa en la sangre todava es igual o menor que 38m35m (3,9mmo46m), ingiera nuevamente 15gramos de un hidrato de carbono.  Si el nivel de glucosa en la sangre no supera los 38mg/9m3,9mmol/38mdespus de 3intentos, solicite ayuda de inmediato.  Ingiera una comida o una colacin en el transcurso de 1hora despus de que la glucemia se haya normalizado. Tratamiento de la glucemia muy baja Si el nivel de glucosa en la sangre es igual o menor que 54mg/dl27mmol/l)79mignifica que est muy bajo (hipoglucemia grave). Esto es una emergEngineer, maintenance (IT)re hasta que los sntomas desaparezcan. Solicite atencin mdica de inmediato. Comunquese con el servicio de emergencias de su localidad (911 en los [place]). No conduzca por sus propios medios hasta el Principal Financialivel de glucosa en la sangre muy bajo y no puede ingerir ningn alimento ni bebida, tal vez deba aplicarse una inyeccin de glucagn. Un familiar o un amigo deben aprender a controlarle la glucemia y a aplicarle una inyeccin de glucagn. Pregntele al mdico si debe tener un kit de inyecciones de glucagn en su casa. CUIDADOS EN EL HOGAR Instrucciones generales  Evite cualquier dieta que le impida ingerir la cantidad suficiente de comida. Hable con el mdico antes de comenzar una dieta nueva.  Tome los medicamentos de venta libre y los recetados solamente como se lo haya indicado el mdico.  Limite el consumo de alcohol a no ms de 1medida p99mda si es mujer y no est embarazadaSan Marinodas p28mda si es hombre. Una medida equivale a 12onzas de cerveza, 5onzas de vino o 1onzas de bebidas alcohlicas de alta graduacin.  Concurra a todas las visitas de control como se lo haya indicado el mdico. Esto es importante. Si usted  tiene diabetes:  Asegrese de conocer losAssurantglucemia.  Siempre tenga a mano una fuente de azcar, como por ejemplo: ? Azcar. ? Comprimidos de azcar. ? Gel de glucosa. ? Jugo de frutas. ? Gaseosa comn (gaseosa que no sea diettica). ? Leche. ? Caramelos duros. ? Miel.  Tome los medicamentos segn las indicaciones.  Siga el plan de ejercicios y de alimentacin. ? Coma a horario. No omita comidas. ? Siga el plan para los das de enfermedad cuando no pueda comer o beber normalmente. Arme este plan de antemano con el mdico.  Contrlese la glucemia con la frecuencia que le haya indicado el mdico. Contrlesela siempre antes y despus de hacer actividad fsica.  Comparta su plan de atencin de la diabetes con estas personas: ? Compaeros de trabajo o de la escuela. ? Aquellas con las que convive.[place]  Hgase anlisis de orina para Product manager presencia de cetonas: ? Cuando est enfermo. ? Como se lo haya indicado el mdico.  Lleve consigo una tarjeta, o use un brazalete o una medalla que indiquen que es diabtico. Si tiene un nivel bajo de glucosa en la sangre debido a otras causas:  Contrlese la glucemia con la frecuencia que le haya indicado el mdico.  Siga las indicaciones del mdico respecto de lo que no puede comer o beber. SOLICITE AYUDA SI:  Tiene problemas para mantener el nivel de glucosa en la sangre dentro del rango indicado.  Tiene la glucemia baja con frecuencia. SOLICITE AYUDA DE INMEDIATO SI:  Contina teniendo sntomas despus de haber comido o ingerido algo con azcar.  La glucemia es igual o inferior a 61m/dl (333ml/dl).  Tiene una crisis de movimientos que no puIT consultant Se desmaya. Estos sntomas pueden inSales executiveNo espere hasta que los sntomas desaparezcan. Solicite atencin mdica de inmediato. Comunquese con el servicio de emergencias de su localidad (911 en los [place]). No conduzca por sus propios  medios haPrincipal FinancialEsta informacin no tiene coMarine scientistl consejo del mdico. Asegrese de hacerle al mdico cualquier pregunta que tenga. Document Released: 02/12/2010 Document Revised: 05/04/2015 Document Reviewed: 02/13/2015 Elsevier Interactive Patient Education  20[employer]

## 2016-12-19 ENCOUNTER — Encounter: Payer: Self-pay | Admitting: Family Medicine

## 2016-12-19 ENCOUNTER — Ambulatory Visit: Payer: 59 | Attending: Family Medicine | Admitting: Family Medicine

## 2016-12-19 VITALS — BP 121/63 | HR 66 | Temp 97.9°F | Resp 18 | Ht 59.0 in | Wt 132.8 lb

## 2016-12-19 DIAGNOSIS — I10 Essential (primary) hypertension: Secondary | ICD-10-CM | POA: Insufficient documentation

## 2016-12-19 DIAGNOSIS — Z6830 Body mass index (BMI) 30.0-30.9, adult: Secondary | ICD-10-CM | POA: Diagnosis not present

## 2016-12-19 DIAGNOSIS — Z7982 Long term (current) use of aspirin: Secondary | ICD-10-CM | POA: Diagnosis not present

## 2016-12-19 DIAGNOSIS — E118 Type 2 diabetes mellitus with unspecified complications: Secondary | ICD-10-CM | POA: Insufficient documentation

## 2016-12-19 DIAGNOSIS — Z23 Encounter for immunization: Secondary | ICD-10-CM | POA: Diagnosis not present

## 2016-12-19 DIAGNOSIS — Z1211 Encounter for screening for malignant neoplasm of colon: Secondary | ICD-10-CM | POA: Diagnosis not present

## 2016-12-19 DIAGNOSIS — Z79899 Other long term (current) drug therapy: Secondary | ICD-10-CM | POA: Insufficient documentation

## 2016-12-19 DIAGNOSIS — Z0000 Encounter for general adult medical examination without abnormal findings: Secondary | ICD-10-CM | POA: Diagnosis not present

## 2016-12-19 DIAGNOSIS — E669 Obesity, unspecified: Secondary | ICD-10-CM | POA: Diagnosis not present

## 2016-12-19 DIAGNOSIS — E138 Other specified diabetes mellitus with unspecified complications: Secondary | ICD-10-CM | POA: Diagnosis not present

## 2016-12-19 DIAGNOSIS — E781 Pure hyperglyceridemia: Secondary | ICD-10-CM | POA: Diagnosis not present

## 2016-12-19 DIAGNOSIS — Z794 Long term (current) use of insulin: Secondary | ICD-10-CM | POA: Diagnosis not present

## 2016-12-19 DIAGNOSIS — E119 Type 2 diabetes mellitus without complications: Secondary | ICD-10-CM | POA: Diagnosis present

## 2016-12-19 LAB — GLUCOSE, POCT (MANUAL RESULT ENTRY): POC GLUCOSE: 153 mg/dL — AB (ref 70–99)

## 2016-12-19 LAB — POCT GLYCOSYLATED HEMOGLOBIN (HGB A1C): HEMOGLOBIN A1C: 7.7

## 2016-12-19 MED ORDER — TRUE METRIX METER W/DEVICE KIT: 1.0000 | PACK | Freq: Once | 0 refills | Status: AC

## 2016-12-19 MED ORDER — ATORVASTATIN CALCIUM 20 MG PO TABS: 20.0000 mg | ORAL_TABLET | Freq: Every day | ORAL | 3 refills | Status: DC

## 2016-12-19 MED ORDER — LOSARTAN POTASSIUM 50 MG PO TABS: 50.0000 mg | ORAL_TABLET | Freq: Every day | ORAL | 3 refills | Status: DC

## 2016-12-19 MED ORDER — HYDROCHLOROTHIAZIDE 25 MG PO TABS: 25.0000 mg | ORAL_TABLET | Freq: Every day | ORAL | 3 refills | Status: DC

## 2016-12-19 MED ORDER — GLIPIZIDE ER 5 MG PO TB24: 5.0000 mg | ORAL_TABLET | Freq: Every day | ORAL | 3 refills | Status: DC

## 2016-12-19 NOTE — Patient Instructions (Signed)
La diabetes mellitus y los alimentos (Diabetes Mellitus and Food) Es importante que controle su nivel de azcar en la sangre (glucosa). El nivel de glucosa en sangre depende en gran medida de lo que usted come. Comer alimentos saludables en las cantidades adecuadas a lo largo del da, aproximadamente a la misma hora todos los das, lo ayudar a controlar su nivel de glucosa en sangre. Tambin puede ayudarlo a retrasar o evitar el empeoramiento de la diabetes mellitus. Comer de manera saludable incluso puede ayudarlo a mejorar el nivel de presin arterial y a alcanzar o mantener un peso saludable. Entre las recomendaciones generales para alimentarse y cocinar los alimentos de forma saludable, se incluyen las siguientes:  Respetar las comidas principales y comer colaciones con regularidad. Evitar pasar largos perodos sin comer con el fin de perder peso.  Seguir una dieta que consista principalmente en alimentos de origen vegetal, como frutas, vegetales, frutos secos, legumbres y cereales integrales.  Utilizar mtodos de coccin a baja temperatura, como hornear, en lugar de mtodos de coccin a alta temperatura, como frer en abundante aceite. Trabaje con el nutricionista para aprender a usar la informacin nutricional de las etiquetas de los alimentos. CMO PUEDEN AFECTARME LOS ALIMENTOS? Carbohidratos Los carbohidratos afectan el nivel de glucosa en sangre ms que cualquier otro tipo de alimento. El nutricionista lo ayudar a determinar cuntos carbohidratos puede consumir en cada comida y ensearle a contarlos. El recuento de carbohidratos es importante para mantener la glucosa en sangre en un nivel saludable, en especial si utiliza insulina o toma determinados medicamentos para la diabetes mellitus. Alcohol El alcohol puede provocar disminuciones sbitas de la glucosa en sangre (hipoglucemia), en especial si utiliza insulina o toma determinados medicamentos para la diabetes mellitus. La  hipoglucemia es una afeccin que puede poner en peligro la vida. Los sntomas de la hipoglucemia (somnolencia, mareos y desorientacin) son similares a los sntomas de haber consumido mucho alcohol. Si el mdico lo autoriza a beber alcohol, hgalo con moderacin y siga estas pautas:  Las mujeres no deben beber ms de un trago por da, y los hombres no deben beber ms de dos tragos por da. Un trago es igual a:  12 onzas (355 ml) de cerveza  5 onzas de vino (150 ml) de vino  1,5onzas (45ml) de bebidas espirituosas  No beba con el estmago vaco.  Mantngase hidratado. Beba agua, gaseosas dietticas o t helado sin azcar.  Las gaseosas comunes, los jugos y otros refrescos podran contener muchos carbohidratos y se deben contar. QU ALIMENTOS NO SE RECOMIENDAN? Cuando haga las elecciones de alimentos, es importante que recuerde que todos los alimentos son distintos. Algunos tienen menos nutrientes que otros por porcin, aunque podran tener la misma cantidad de caloras o carbohidratos. Es difcil darle al cuerpo lo que necesita cuando consume alimentos con menos nutrientes. Estos son algunos ejemplos de alimentos que debera evitar ya que contienen muchas caloras y carbohidratos, pero pocos nutrientes:  Grasas trans (la mayora de los alimentos procesados incluyen grasas trans en la etiqueta de Informacin nutricional).  Gaseosas comunes.  Jugos.  Caramelos.  Dulces, como tortas, pasteles, rosquillas y galletas.  Comidas fritas. QU ALIMENTOS PUEDO COMER? Consuma alimentos ricos en nutrientes, que nutrirn el cuerpo y lo mantendrn saludable. Los alimentos que debe comer tambin dependern de varios factores, como:  Las caloras que necesita.  Los medicamentos que toma.  Su peso.  El nivel de glucosa en sangre.  El nivel de presin arterial.  El nivel de colesterol. Debe consumir   una amplia variedad de alimentos, por ejemplo:  Protenas.  Cortes de carne  magros.  Protenas con bajo contenido de grasas saturadas, como pescado, clara de huevo y frijoles. Evite las carnes procesadas.  Frutas y vegetales.  Frutas y vegetales que pueden ayudar a controlar los niveles sanguneos de glucosa, como manzanas, mangos y batatas.  Productos lcteos.  Elija productos lcteos sin grasa o con bajo contenido de grasa, como leche, yogur y queso.  Cereales, panes, pastas y arroz.  Elija cereales integrales, como panes multicereales, avena en grano y arroz integral. Estos alimentos pueden ayudar a controlar la presin arterial.  Grasas.  Alimentos que contengan grasas saludables, como frutos secos, aguacate, aceite de oliva, aceite de canola y pescado. TODOS LOS QUE PADECEN DIABETES MELLITUS TIENEN EL MISMO PLAN DE COMIDAS? Dado que todas las personas que padecen diabetes mellitus son distintas, no hay un solo plan de comidas que funcione para todos. Es muy importante que se rena con un nutricionista que lo ayudar a crear un plan de comidas adecuado para usted. Esta informacin no tiene como fin reemplazar el consejo del mdico. Asegrese de hacerle al mdico cualquier pregunta que tenga. Document Released: 04/19/2007 Document Revised: 01/31/2014 Document Reviewed: 12/07/2012 Elsevier Interactive Patient Education  2017 Elsevier Inc.  

## 2016-12-19 NOTE — Progress Notes (Signed)
Patient is here for f/up  

## 2016-12-19 NOTE — Progress Notes (Deleted)
   Subjective:  Patient ID: Amanda Duarte, female    DOB: 06-Dec-1955  Age: 61 y.o. MRN: [id number]  CC: Diabetes and Hypertension   HPI Amanda Duarte presents for ***  CBG malfunction meter    Outpatient Medications Prior to Visit  Medication Sig Dispense Refill  . aspirin 81 MG EC tablet Take 1 tablet (81 mg total) by mouth daily. 30 tablet 11  . atorvastatin (LIPITOR) 20 MG tablet Take 1 tablet (20 mg total) by mouth daily. 30 tablet 2  . glipiZIDE (GLUCOTROL XL) 2.5 MG 24 hr tablet Take 1 tablet (2.5 mg total) by mouth daily with breakfast. 30 tablet 2  . glucose blood test strip Use as instructed 100 each 12  . hydrochlorothiazide (HYDRODIURIL) 25 MG tablet Take 1 tablet (25 mg total) by mouth daily. 30 tablet 2  . losartan (COZAAR) 50 MG tablet Take 1 tablet (50 mg total) by mouth daily. 30 tablet 2  . metFORMIN (GLUCOPHAGE) 1000 MG tablet Take 1 tablet (1,000 mg total) by mouth 2 (two) times daily with a meal. 60 tablet 2   Facility-Administered Medications Prior to Visit  Medication Dose Route Frequency Provider Last Rate Last Dose  . insulin aspart (novoLOG) injection 10 Units  10 Units Subcutaneous Once Fredia Beets R, FNP        ROS Review of Systems  Review of Systems - {ros [id number]}    Objective:  BP 121/63 (BP Location: Left Arm, Patient Position: Sitting, Cuff Size: Normal)   Pulse 66   Temp 97.9 F (36.6 C) (Oral)   Resp 18   Ht 4\' 11"  (1.499 m)   Wt 132 lb 12.8 oz (60.2 kg)   SpO2 98%   BMI 26.82 kg/m   BP/Weight 12/19/2016 10/11/2016 4/48/1856  Systolic BP 314 970 263  Diastolic BP 63 73 72  Wt. (Lbs) 132.8 - -  BMI 26.82 - -     Physical Exam   Assessment & Plan:   Problem List Items Addressed This Visit      Endocrine   DM (diabetes mellitus) ([place]) - Primary   Relevant Orders   Glucose (CBG) (Completed)   HgB A1c (Completed)      No orders of the defined types were placed in this encounter.   Follow-up:  No Follow-up on file.   Alfonse Spruce FNP   b

## 2016-12-19 NOTE — Progress Notes (Signed)
Subjective:  Patient ID: Amanda Duarte, female    DOB: Jul 08, 1955  Age: 61 y.o. MRN: [id number]  CC: Diabetes and Hypertension   HPI Amanda Duarte presents for HTN and DM f/u. Interpreter services used. History of DM. Symptoms: none. Patient denies foot ulcerations, nausea, paresthesia of the feet, polydipsia, polyuria, visual disturbances and vomitting.  Evaluation to date has been included: fasting blood sugar and microalbuminuria.  Home sugars: patient does not check sugars. She reports her glucometer is not working. Treatment to date: metfromin.History of HTN.  She is not exercising and is not adherent to low salt diet.  She does not check BP at home. Cardiac symptoms none. Patient denies chest pain, chest pressure/discomfort, claudication, dyspnea, lower extremity edema, near-syncope, palpitations and syncope.  Cardiovascular risk factors: diabetes mellitus, hypertension, obesity (BMI >= 30 kg/m2) and sedentary lifestyle. Use of agents associated with hypertension: none. History of target organ damage: none.   Outpatient Medications Prior to Visit  Medication Sig Dispense Refill  . aspirin 81 MG EC tablet Take 1 tablet (81 mg total) by mouth daily. 30 tablet 11  . glucose blood test strip Use as instructed 100 each 12  . metFORMIN (GLUCOPHAGE) 1000 MG tablet Take 1 tablet (1,000 mg total) by mouth 2 (two) times daily with a meal. 60 tablet 2  . atorvastatin (LIPITOR) 20 MG tablet Take 1 tablet (20 mg total) by mouth daily. 30 tablet 2  . glipiZIDE (GLUCOTROL XL) 2.5 MG 24 hr tablet Take 1 tablet (2.5 mg total) by mouth daily with breakfast. 30 tablet 2  . hydrochlorothiazide (HYDRODIURIL) 25 MG tablet Take 1 tablet (25 mg total) by mouth daily. 30 tablet 2  . losartan (COZAAR) 50 MG tablet Take 1 tablet (50 mg total) by mouth daily. 30 tablet 2   Facility-Administered Medications Prior to Visit  Medication Dose Route Frequency Provider Last Rate Last Dose  . insulin aspart  (novoLOG) injection 10 Units  10 Units Subcutaneous Once Lonzell Dorris R, FNP        ROS Review of Systems  Constitutional: Negative.   Eyes: Negative.   Respiratory: Negative.   Cardiovascular: Negative.   Gastrointestinal: Negative.   Skin: Negative.   Neurological: Negative.     Objective:  BP 121/63 (BP Location: Left Arm, Patient Position: Sitting, Cuff Size: Normal)   Pulse 66   Temp 97.9 F (36.6 C) (Oral)   Resp 18   Ht '4\' 11"'$  (1.499 m)   Wt 132 lb 12.8 oz (60.2 kg)   SpO2 98%   BMI 26.82 kg/m   BP/Weight 12/19/2016 10/11/2016 2/44/6286  Systolic BP 381 771 165  Diastolic BP 63 73 72  Wt. (Lbs) 132.8 - -  BMI 26.82 - -   Physical Exam  Constitutional: She appears well-developed and well-nourished.  Eyes: Conjunctivae are normal. Pupils are equal, round, and reactive to light.  Neck: Normal range of motion. Neck supple.  Cardiovascular: Normal rate, regular rhythm, normal heart sounds and intact distal pulses.  Pulmonary/Chest: Effort normal and breath sounds normal.  Abdominal: Soft. Bowel sounds are normal. There is no tenderness.  Neurological: No sensory deficit.  Skin: Skin is warm and dry.  Nursing note and vitals reviewed.   Assessment & Plan:   1. Diabetes mellitus of other type with complication, unspecified whether long term insulin use (HCC) Increased glipizide. Encouraged more attention to dietary changes and exercise. Provided with script for new glucometer. - Glucose (CBG) - HgB A1c - glipiZIDE (GLUCOTROL XL) 5 MG  24 hr tablet; Take 1 tablet (5 mg total) by mouth daily with breakfast.  Dispense: 30 tablet; Refill: 3 - Blood Glucose Monitoring Suppl (TRUE METRIX METER) w/Device KIT; 1 Device by Does not apply route once for 1 dose.  Dispense: 1 kit; Refill: 0  2. Essential hypertension  - hydrochlorothiazide (HYDRODIURIL) 25 MG tablet; Take 1 tablet (25 mg total) by mouth daily.  Dispense: 30 tablet; Refill: 3 - losartan (COZAAR) 50 MG  tablet; Take 1 tablet (50 mg total) by mouth daily.  Dispense: 30 tablet; Refill: 3  3. Screening for colon cancer  - Fecal occult blood, imunochemical  4. Healthcare maintenance  - Pneumococcal polysaccharide vaccine 23-valent greater than or equal to 2yo subcutaneous/IM  5. Pure hypertriglyceridemia  - atorvastatin (LIPITOR) 20 MG tablet; Take 1 tablet (20 mg total) by mouth daily.  Dispense: 30 tablet; Refill: 3     Follow-up: Return in about 3 months (around 03/21/2017) for DM.   Alfonse Spruce FNP

## 2016-12-27 LAB — FECAL OCCULT BLOOD, IMMUNOCHEMICAL: Fecal Occult Bld: NEGATIVE

## 2016-12-28 ENCOUNTER — Other Ambulatory Visit: Payer: Self-pay | Admitting: Family Medicine

## 2017-01-04 ENCOUNTER — Telehealth: Payer: Self-pay | Admitting: Family Medicine

## 2017-01-04 DIAGNOSIS — E1165 Type 2 diabetes mellitus with hyperglycemia: Secondary | ICD-10-CM

## 2017-01-04 MED ORDER — METFORMIN HCL 1000 MG PO TABS: 1000.0000 mg | ORAL_TABLET | Freq: Two times a day (BID) | ORAL | 2 refills | Status: DC

## 2017-01-04 NOTE — Telephone Encounter (Signed)
Refilled

## 2017-01-04 NOTE — Telephone Encounter (Signed)
Pt came to the office to request a refill for metFORMIN (GLUCOPHAGE) 1000 MG tablet  Please sent it to [facility] pharmacy, please follow up

## 2017-01-24 HISTORY — PX: MASTECTOMY: SHX3

## 2017-03-06 ENCOUNTER — Other Ambulatory Visit: Payer: Self-pay | Admitting: Family Medicine

## 2017-03-06 DIAGNOSIS — E1165 Type 2 diabetes mellitus with hyperglycemia: Secondary | ICD-10-CM

## 2017-03-06 MED ORDER — METFORMIN HCL 1000 MG PO TABS: ORAL_TABLET | ORAL | 2 refills | Status: DC

## 2017-03-21 ENCOUNTER — Ambulatory Visit: Payer: 59 | Admitting: Family Medicine

## 2017-03-22 ENCOUNTER — Ambulatory Visit: Payer: 59 | Attending: Internal Medicine | Admitting: Physician Assistant

## 2017-03-22 VITALS — BP 154/93 | HR 62 | Temp 98.2°F | Ht 59.0 in | Wt 133.8 lb

## 2017-03-22 DIAGNOSIS — Z20828 Contact with and (suspected) exposure to other viral communicable diseases: Secondary | ICD-10-CM | POA: Insufficient documentation

## 2017-03-22 DIAGNOSIS — Z789 Other specified health status: Secondary | ICD-10-CM

## 2017-03-22 DIAGNOSIS — E119 Type 2 diabetes mellitus without complications: Secondary | ICD-10-CM | POA: Insufficient documentation

## 2017-03-22 DIAGNOSIS — I10 Essential (primary) hypertension: Secondary | ICD-10-CM | POA: Insufficient documentation

## 2017-03-22 DIAGNOSIS — Z7984 Long term (current) use of oral hypoglycemic drugs: Secondary | ICD-10-CM | POA: Insufficient documentation

## 2017-03-22 DIAGNOSIS — E138 Other specified diabetes mellitus with unspecified complications: Secondary | ICD-10-CM

## 2017-03-22 DIAGNOSIS — Z7982 Long term (current) use of aspirin: Secondary | ICD-10-CM | POA: Diagnosis not present

## 2017-03-22 DIAGNOSIS — Z79899 Other long term (current) drug therapy: Secondary | ICD-10-CM | POA: Diagnosis not present

## 2017-03-22 DIAGNOSIS — R51 Headache: Secondary | ICD-10-CM | POA: Diagnosis present

## 2017-03-22 DIAGNOSIS — J40 Bronchitis, not specified as acute or chronic: Secondary | ICD-10-CM | POA: Diagnosis not present

## 2017-03-22 LAB — GLUCOSE, POCT (MANUAL RESULT ENTRY): POC Glucose: 241 mg/dl — AB (ref 70–99)

## 2017-03-22 MED ORDER — AZITHROMYCIN 250 MG PO TABS: ORAL_TABLET | ORAL | 0 refills | Status: DC

## 2017-03-22 MED ORDER — FLUTICASONE PROPIONATE 50 MCG/ACT NA SUSP: 2.0000 | Freq: Every day | NASAL | 6 refills | Status: DC

## 2017-03-22 MED ORDER — FLUCONAZOLE 150 MG PO TABS: 150.0000 mg | ORAL_TABLET | Freq: Once | ORAL | 0 refills | Status: AC

## 2017-03-22 MED ORDER — OSELTAMIVIR PHOSPHATE 75 MG PO CAPS: 75.0000 mg | ORAL_CAPSULE | Freq: Every day | ORAL | 0 refills | Status: DC

## 2017-03-22 NOTE — Progress Notes (Signed)
Patient ID: Amanda Duarte, female   DOB: Jun 13, 1955, 62 y.o.   MRN: [id number]       Amanda Duarte, is a 62 y.o. female  IHK:[id number]  [id number]:[id number]  DOB - 1955-09-26  Subjective:  Chief Complaint and HPI: Amanda Duarte is a 62 y.o. female here today with sinus HA X 3 weeks.  Also cold and cough.  Some ST.  Also was exposed to flu a couple of days ago.  No f/c.  Cough is productive of greenish phlegm. Not taking any OTC meds.    Not checking blood sugars at home.    [employer] interpreters Estill Bamberg translating.    ROS:   Constitutional:  No f/c, No night sweats, No unexplained weight loss. EENT:  No vision changes, No blurry vision, No hearing changes. No additional mouth, throat, or ear problems.  Respiratory: + cough, No SOB Cardiac: No CP, no palpitations GI:  No abd pain, No N/V/D. GU: No Urinary s/sx Musculoskeletal: No joint pain Neuro: +sinus HA headache, no dizziness, no motor weakness.  Skin: No rash Endocrine:  No polydipsia. No polyuria.  Psych: Denies SI/HI  No problems updated.  ALLERGIES: No Known Allergies  PAST MEDICAL HISTORY: Past Medical History:  Diagnosis Date  . Diabetes mellitus without complication ([place])   . Hypertension     MEDICATIONS AT HOME: Prior to Admission medications   Medication Sig Start Date End Date Taking? Authorizing Provider  aspirin 81 MG EC tablet Take 1 tablet (81 mg total) by mouth daily. 09/07/16  Yes Hairston, Maylon Peppers, FNP  atorvastatin (LIPITOR) 20 MG tablet Take 1 tablet (20 mg total) by mouth daily. 12/19/16  Yes Hairston, Maylon Peppers, FNP  glipiZIDE (GLUCOTROL XL) 5 MG 24 hr tablet Take 1 tablet (5 mg total) by mouth daily with breakfast. 12/19/16  Yes Hairston, Mandesia R, FNP  glucose blood test strip Use as instructed 09/07/16  Yes Hairston, Mandesia R, FNP  hydrochlorothiazide (HYDRODIURIL) 25 MG tablet Take 1 tablet (25 mg total) by mouth daily. 12/19/16  Yes Hairston, Mandesia R, FNP  losartan  (COZAAR) 50 MG tablet Take 1 tablet (50 mg total) by mouth daily. 12/19/16  Yes Hairston, Toy Baker R, FNP  metFORMIN (GLUCOPHAGE) 1000 MG tablet TAKE 1 TABLET BY MOUTH 2 TIMES DAILY WITH A MEAL. 03/06/17  Yes Alfonse Spruce, FNP  azithromycin (ZITHROMAX) 250 MG tablet Take 2 today then 1 daily 03/22/17   Argentina Donovan, PA-C  fluconazole (DIFLUCAN) 150 MG tablet Take 1 tablet (150 mg total) by mouth once for 1 dose. 03/22/17 03/22/17  Argentina Donovan, PA-C  fluticasone (FLONASE) 50 MCG/ACT nasal spray Place 2 sprays into both nostrils daily. 03/22/17   Argentina Donovan, PA-C  oseltamivir (TAMIFLU) 75 MG capsule Take 1 capsule (75 mg total) by mouth daily. For prevention of flu 03/22/17   Argentina Donovan, PA-C     Objective:  EXAM:   Vitals:   03/22/17 1044  BP: (!) 154/93  Pulse: 62  Temp: 98.2 F (36.8 C)  TempSrc: Oral  SpO2: 97%  Weight: 133 lb 12.8 oz (60.7 kg)  Height: 4\' 11"  (1.499 m)    General appearance : A&OX3. NAD. Non-toxic-appearing HEENT: Atraumatic and Normocephalic.  PERRLA. EOM intact.  TM full B. Mouth-MMM, post pharynx WNL w/o erythema, + PND.  Mild maxillary sinus TTP Neck: supple, no JVD. No cervical lymphadenopathy. No thyromegaly Chest/Lungs:  Breathing-non-labored, Good air entry bilaterally, breath sounds normal without rales, rhonchi, or wheezing  CVS: S1 S2 regular,  no murmurs, gallops, rubs  Extremities: Bilateral Lower Ext shows no edema, both legs are warm to touch with = pulse throughout Neurology:  CN II-XII grossly intact, Non focal.   Psych:  TP linear. J/I WNL. Normal speech. Appropriate eye contact and affect.  Skin:  No Rash  Data Review Lab Results  Component Value Date   HGBA1C 7.7 12/19/2016   HGBA1C 10.3 09/07/2016   HGBA1C 7.4 (H) 05/19/2014     Assessment & Plan   1. Diabetes mellitus of other type with complication, unspecified whether long term insulin use ([place]) Not controlled.  Diabetic diet emphasized and  encouraged.  Increase water intake.  I have had a lengthy discussion and provided education about insulin resistance and the intake of too much sugar/refined carbohydrates.  I have advised the patient to work at a goal of eliminating sugary drinks, candy, desserts, sweets, refined sugars, processed foods, and white carbohydrates.  The patient expresses understanding.  - Glucose (CBG)  2. Bronchitis Cover for atypicals - fluticasone (FLONASE) 50 MCG/ACT nasal spray; Place 2 sprays into both nostrils daily.  Dispense: 16 g; Refill: 6 - azithromycin (ZITHROMAX) 250 MG tablet; Take 2 today then 1 daily  Dispense: 6 tablet; Refill: 0 - fluconazole (DIFLUCAN) 150 MG tablet; Take 1 tablet (150 mg total) by mouth once for 1 dose.  Dispense: 1 tablet; Refill: 0  3. Exposure to the flu - oseltamivir (TAMIFLU) 75 MG capsule; Take 1 capsule (75 mg total) by mouth daily. For prevention of flu  Dispense: 10 capsule; Refill: 0  4.  Language barrier-[employer] interpreters used and additional time performing visit was required.   Patient have been counseled extensively about nutrition and exercise  Return for keep 03/29/2017 appt with [name].  The patient was given clear instructions to go to ER or return to medical center if symptoms don't improve, worsen or new problems develop. The patient verbalized understanding. The patient was told to call to get lab results if they haven't heard anything in the next week.     Freeman Caldron, PA-C [facility] [place], [place]   03/22/2017, 10:59 AM

## 2017-03-22 NOTE — Progress Notes (Signed)
Patient is having pain in head.

## 2017-03-24 DIAGNOSIS — C801 Malignant (primary) neoplasm, unspecified: Secondary | ICD-10-CM

## 2017-03-24 HISTORY — DX: Malignant (primary) neoplasm, unspecified: C80.1

## 2017-03-29 ENCOUNTER — Ambulatory Visit: Payer: 59 | Attending: Family Medicine | Admitting: Nurse Practitioner

## 2017-03-29 ENCOUNTER — Encounter: Payer: Self-pay | Admitting: Nurse Practitioner

## 2017-03-29 VITALS — BP 154/76 | HR 65 | Temp 98.2°F | Ht 59.0 in | Wt 133.6 lb

## 2017-03-29 DIAGNOSIS — E1165 Type 2 diabetes mellitus with hyperglycemia: Secondary | ICD-10-CM | POA: Insufficient documentation

## 2017-03-29 DIAGNOSIS — Z1231 Encounter for screening mammogram for malignant neoplasm of breast: Secondary | ICD-10-CM | POA: Diagnosis not present

## 2017-03-29 DIAGNOSIS — M545 Low back pain, unspecified: Secondary | ICD-10-CM

## 2017-03-29 DIAGNOSIS — Z7982 Long term (current) use of aspirin: Secondary | ICD-10-CM | POA: Insufficient documentation

## 2017-03-29 DIAGNOSIS — Z1211 Encounter for screening for malignant neoplasm of colon: Secondary | ICD-10-CM | POA: Diagnosis not present

## 2017-03-29 DIAGNOSIS — Z794 Long term (current) use of insulin: Secondary | ICD-10-CM | POA: Diagnosis not present

## 2017-03-29 DIAGNOSIS — Z79899 Other long term (current) drug therapy: Secondary | ICD-10-CM | POA: Diagnosis not present

## 2017-03-29 DIAGNOSIS — Z760 Encounter for issue of repeat prescription: Secondary | ICD-10-CM | POA: Insufficient documentation

## 2017-03-29 DIAGNOSIS — E781 Pure hyperglyceridemia: Secondary | ICD-10-CM | POA: Insufficient documentation

## 2017-03-29 DIAGNOSIS — I10 Essential (primary) hypertension: Secondary | ICD-10-CM | POA: Insufficient documentation

## 2017-03-29 LAB — POCT GLYCOSYLATED HEMOGLOBIN (HGB A1C): Hemoglobin A1C: 9.7

## 2017-03-29 LAB — GLUCOSE, POCT (MANUAL RESULT ENTRY): POC GLUCOSE: 232 mg/dL — AB (ref 70–99)

## 2017-03-29 MED ORDER — SITAGLIPTIN-METFORMIN HCL 50-1000 MG PO TABS
1.0000 | ORAL_TABLET | Freq: Two times a day (BID) | ORAL | 2 refills | Status: DC
Start: 2017-03-29 — End: 2017-04-03

## 2017-03-29 MED ORDER — GLUCOSE BLOOD VI STRP: ORAL_STRIP | 12 refills | Status: DC

## 2017-03-29 MED ORDER — ONETOUCH VERIO W/DEVICE KIT: 1.0000 | PACK | Freq: Every morning | 0 refills | Status: DC

## 2017-03-29 MED ORDER — ONETOUCH ULTRASOFT LANCETS MISC: 12 refills | Status: DC

## 2017-03-29 MED ORDER — HYDROCHLOROTHIAZIDE 25 MG PO TABS: 25.0000 mg | ORAL_TABLET | Freq: Every day | ORAL | 3 refills | Status: DC

## 2017-03-29 MED ORDER — LOSARTAN POTASSIUM 50 MG PO TABS: 50.0000 mg | ORAL_TABLET | Freq: Every day | ORAL | 3 refills | Status: DC

## 2017-03-29 MED ORDER — TRUEPLUS LANCETS 28G MISC: 3 refills | Status: DC

## 2017-03-29 MED ORDER — ATORVASTATIN CALCIUM 20 MG PO TABS: 20.0000 mg | ORAL_TABLET | Freq: Every day | ORAL | 3 refills | Status: DC

## 2017-03-29 MED ORDER — CYCLOBENZAPRINE HCL 5 MG PO TABS: 5.0000 mg | ORAL_TABLET | Freq: Three times a day (TID) | ORAL | 1 refills | Status: DC | PRN

## 2017-03-29 MED ORDER — ONETOUCH ULTRA 2 W/DEVICE KIT: PACK | 0 refills | Status: DC

## 2017-03-29 MED ORDER — TRUE METRIX METER W/DEVICE KIT: PACK | 0 refills | Status: DC

## 2017-03-29 NOTE — Patient Instructions (Addendum)
Dolor de espalda en adultos  (Back Pain, Adult)  El dolor de espalda es muy frecuente. A menudo mejora con el tiempo. La causa del dolor de espalda generalmente no es peligrosa. La mayora de las personas puede aprender a manejar el dolor de espalda por s mismas.  CUIDADOS EN EL HOGAR  Controle su dolor de espalda a fin de detectar algn cambio. Las siguientes indicaciones ayudarn a aliviar cualquier dolor que pueda sentir:   Mantngase activo. Comience con caminatas cortas sobre superficies planas si es posible. Trate de caminar un poco ms cada da.   Haga ejercicios con regularidad tal como le indic el mdico. El ejercicio ayuda a que su espalda se cure ms rpidamente. Tambin ayuda a prevenir futuras lesiones al mantener los msculos fuertes y flexibles.   No se siente, conduzca ni permanezca de pie durante ms de 30 minutos.   No permanezca en la cama. Si hace reposo ms de 1 a 2 das, puede demorar su recuperacin.   Sea cuidadoso al inclinarse o levantar un objeto. Use una tcnica apropiada para levantar peso:  ? Flexione las rodillas.  ? Mantenga el objeto cerca del cuerpo.  ? No gire.   Duerma sobre un colchn firme. Recustese sobre un costado y flexione las rodillas. Si se recuesta sobre la espalda, coloque una almohada debajo de las rodillas.   Tome los medicamentos solamente como se lo haya indicado el mdico.   Aplique hielo sobre la zona lesionada.  ? Ponga el hielo en una bolsa plstica.  ? Coloque una toalla entre la piel y la bolsa de hielo.  ? Deje el hielo durante 20minutos, 2 a 3veces por da, durante los primeros 2 o 3das. Despus de eso, puede alternar entre compresas de hielo y calor.   Evite sentir ansiedad o estrs. Encuentre maneras efectivas de lidiar con el estrs, como hacer ejercicio.   Mantenga un peso saludable. El peso excesivo ejerce tensin sobre la espalda.  SOLICITE AYUDA SI:   Siente dolor que no se alivia con reposo o medicamentos.   Siente cada vez ms  dolor que se extiende a las piernas o los glteos.   El dolor no mejora en una semana.   Siente dolor por la noche.   Pierde peso.   Siente escalofros o fiebre.  SOLICITE AYUDA DE INMEDIATO SI:   No puede controlar su materia fecal (heces) o el pis (orina).   Siente debilidad en las piernas o los brazos.   Siente prdida de la sensibilidad (adormecimiento) en las piernas o los brazos.   Tiene malestar estomacal (nuseas) o vomita.   Siente dolor de estmago (abdominal).   Siente que se desvanece (se desmaya).  Esta informacin no tiene como fin reemplazar el consejo del mdico. Asegrese de hacerle al mdico cualquier pregunta que tenga.  Document Released: 07/26/2010 Document Revised: 01/31/2014 Document Reviewed: 05/14/2013  Elsevier Interactive Patient Education  2018 [employer]vier Inc.

## 2017-03-29 NOTE — Progress Notes (Signed)
Assessment & Plan:  Amanda Duarte was seen today for establish care, back pain and medication refill.  Diagnoses and all orders for this visit:  Diabetes mellitus of other type with complication, unspecified whether long term insulin use (HCC)  Essential hypertension -     hydrochlorothiazide (HYDRODIURIL) 25 MG tablet; Take 1 tablet (25 mg total) by mouth daily. -     losartan (COZAAR) 50 MG tablet; Take 1 tablet (50 mg total) by mouth daily.  Continue all antihypertensives as prescribed.  Remember to bring in your blood pressure log with you for your follow up appointment.  DASH/Mediterranean Diets are healthier choices for HTN.    Colon cancer screening -     Ambulatory referral to Gastroenterology  Pure hypertriglyceridemia -     atorvastatin (LIPITOR) 20 MG tablet; Take 1 tablet (20 mg total) by mouth daily.  Work on a low fat, heart healthy diet and participate in regular aerobic exercise program to control as well by working out at least 150 minutes per week. No fried foods. No junk foods, sodas, sugary drinks, unhealthy snacking, or smoking.   Breast cancer screening by mammogram -     MM SCREENING BREAST TOMO BILATERAL; Future  Uncontrolled type 2 diabetes mellitus with hyperglycemia, without long-term current use of insulin (HCC) -  -     Glucose (CBG) -     HgB A1c -     Ambulatory referral to Ophthalmology -     sitaGLIPtin-metformin (JANUMET) 50-1000 MG tablet; Take 1 tablet by mouth 2 (two) times daily with a meal.    TRUEPLUS LANCETS 28G MISC; Please check your blood sugars once in the morning before breakfast -     glucose blood test strip; Use as instructed -     Lancets (ONETOUCH ULTRASOFT) lancets; Use as instructed -     Blood Glucose Monitoring Suppl (ONE TOUCH ULTRA 2) w/Device KIT; Please check blood sugar by fingerstick once in the morning before you eat and once in the evening 1 hour after you eat.  Diabetes is poorly controlled. Advised patient to keep a  fasting blood sugar log fast, 2 hours post lunch and bedtime which will be reviewed at the next office visit.   Acute left-sided low back pain without sciatica -     cyclobenzaprine (FLEXERIL) 5 MG tablet; Take 1 tablet (5 mg total) by mouth 3 (three) times daily as needed for muscle spasms. Work on losing weight to help reduce back pain. May alternate with heat and ice application for pain relief. May also alternate with acetaminophen and Ibuprofen as prescribed for back pain. Other alternatives include massage, acupuncture and water aerobics.  You must stay active and avoid a sedentary lifestyle.       Patient has been counseled on age-appropriate routine health concerns for screening and prevention. These are reviewed and up-to-date. Referrals have been placed accordingly. Immunizations are up-to-date or declined.    Subjective:   Chief Complaint  Patient presents with  . Establish Care    Patient is here to establish care for diabetes and hypertension.  . Back Pain    Pt's stated she fell 4 months ago and she get her back pain sometimes.   . Medication Refill    Patient request medication refill.    HPI Amanda Duarte 62 y.o. female presents to office today to establish care. VRI was used to communicate directly with patient for the entire encounter including providing detailed patient instructions.    Diabetes Mellitus  Type 2 Chronic.  Poorly controlled.  A1c is currently 9.7 which is up from her A1c on 12/19/2016 which was 7.7.  She does not check her blood sugars at home.    She reports the glucometer she received last year does not work. When she draws her blood and places it on the strip the machine only shows lines instead of numbers. She endorses medication compliance taking metformin 1064m BID and glipizide 566mdaily. She reports her mother died a few months ago. She has been stress eating unhealthy foods. She denies any visual disturbances, hypo-or hyperglycemic  symptoms.  She is not current on her eye exam.  Lab Results  Component Value Date   HGBA1C 9.7 03/29/2017   Essential Hypertension Chronic. Not well controlled. She endorses medication compliance with losartan 5030maily.  She was also prescribed hydrochlorothiazide 25 mg daily however she only has 3 bottles of medication here today and her only antihypertensive pill bottle is losartan.  When I questioned if she has been taking hydrochlorothiazide she denies and states she only takes the medications that the pharmacy gave her.  She was instructed today to start taking her hydrochlorothiazide again in addition to the losartan.  She verbalized understanding.  She is not diet or exercise compliant. She does not check her blood pressure at home. Denies chest pain, shortness of breath, palpitations, lightheadedness, dizziness, headaches or BLE edema.  BP Readings from Last 3 Encounters:  03/29/17 (!) 154/76  03/22/17 (!) 154/93  12/19/16 121/63   Hyperlipidemia She has not been taking lipitor. She did not pick up her prescription and states she only picked up the medication that was given to her by the pharmacy.  She denies any statin intolerance or myalgias she was instructed to restart her atorvastatin as previously prescribed. She verbalized understanding.  Lab Results  Component Value Date   LDLCALC 82 09/07/2016   Back Pain She sustained a fall several months ago when she slipped on water near the dishwasher.  She did not have a previous workup for this fall.   Relieving factors: lying on the floor/ stretching. Aggravating factors: twisting. Duration: minutes.  She has not taken any medications or applied any heat for pain relief.  Review of Systems  Constitutional: Negative for fever, malaise/fatigue and weight loss.  HENT: Negative.  Negative for nosebleeds.   Eyes: Negative.  Negative for blurred vision, double vision and photophobia.  Respiratory: Negative.  Negative for cough and  shortness of breath.   Cardiovascular: Negative.  Negative for chest pain, palpitations and leg swelling.  Gastrointestinal: Negative.  Negative for heartburn, nausea and vomiting.  Musculoskeletal: Positive for back pain and myalgias.  Neurological: Negative.  Negative for dizziness, focal weakness, seizures and headaches.  Psychiatric/Behavioral: Negative.  Negative for suicidal ideas.    Past Medical History:  Diagnosis Date  . [place] ([facility] . Hyperlipidemia   . Hypertension     History reviewed. No pertinent surgical history.  Family History  Problem Relation Age of Onset  . Diabetes Son     Social History Reviewed with no changes to be made today.   Outpatient Medications Prior to Visit  Medication Sig Dispense Refill  . glipiZIDE (GLUCOTROL XL) 5 MG 24 hr tablet Take 1 tablet (5 mg total) by mouth daily with breakfast. 30 tablet 3  . atorvastatin (LIPITOR) 20 MG tablet Take 1 tablet (20 mg total) by mouth daily. 30 tablet 3  . losartan (COZAAR) 50 MG tablet  Take 1 tablet (50 mg total) by mouth daily. 30 tablet 3  . metFORMIN (GLUCOPHAGE) 1000 MG tablet TAKE 1 TABLET BY MOUTH 2 TIMES DAILY WITH A MEAL. 60 tablet 2  . aspirin 81 MG EC tablet Take 1 tablet (81 mg total) by mouth daily. (Patient not taking: Reported on 03/29/2017) 30 tablet 11  . fluticasone (FLONASE) 50 MCG/ACT nasal spray Place 2 sprays into both nostrils daily. (Patient not taking: Reported on 03/29/2017) 16 g 6  . azithromycin (ZITHROMAX) 250 MG tablet Take 2 today then 1 daily 6 tablet 0  . glucose blood test strip Use as instructed (Patient not taking: Reported on 03/29/2017) 100 each 12  . hydrochlorothiazide (HYDRODIURIL) 25 MG tablet Take 1 tablet (25 mg total) by mouth daily. (Patient not taking: Reported on 03/29/2017) 30 tablet 3  . oseltamivir (TAMIFLU) 75 MG capsule Take 1 capsule (75 mg total) by mouth daily. For prevention of flu (Patient not taking: Reported on 03/29/2017)  10 capsule 0  . insulin aspart (novoLOG) injection 10 Units      No facility-administered medications prior to visit.     No Known Allergies     Objective:    BP (!) 154/76 (BP Location: Left Arm, Patient Position: Sitting, Cuff Size: Normal)   Pulse 65   Temp 98.2 F (36.8 C) (Oral)   Ht _0  (1.499 m)   Wt 133 lb 9.6 oz (60.6 kg)   SpO2 97%   BMI 26.98 kg/m  Wt Readings from Last 3 Encounters:  03/29/17 133 lb 9.6 oz (60.6 kg)  03/22/17 133 lb 12.8 oz (60.7 kg)  12/19/16 132 lb 12.8 oz (60.2 kg)    Physical Exam  Constitutional: She is oriented to person, place, and time. She appears well-developed and well-nourished. She is cooperative.  HENT:  Head: Normocephalic and atraumatic.  Eyes: EOM are normal.  Neck: Normal range of motion.  Cardiovascular: Normal rate, regular rhythm, normal heart sounds and intact distal pulses. Exam reveals no gallop and no friction rub.  No murmur heard. Pulmonary/Chest: Effort normal and breath sounds normal. No tachypnea. No respiratory distress. She has no decreased breath sounds. She has no wheezes. She has no rhonchi. She has no rales. She exhibits no tenderness.  Abdominal: Soft. Bowel sounds are normal.  Musculoskeletal: Normal range of motion. She exhibits no edema.       Arms: Neurological: She is alert and oriented to person, place, and time. Coordination normal.  Skin: Skin is warm and dry.  Psychiatric: She has a normal mood and affect. Her behavior is normal. Judgment and thought content normal.  Nursing note and vitals reviewed.      Patient has been counseled extensively about nutrition and exercise as well as the importance of adherence with medications and regular follow-up. The patient was given clear instructions to go to ER or return to medical center if symptoms don't improve, worsen or new problems develop. The patient verbalized understanding.   Follow-up: Return in about 3 weeks (around 04/19/2017) for BP  recheck, Fasting labs.   [name], FNP-BC [facility] [place], [place]   03/29/2017, 4:58 PM

## 2017-04-03 ENCOUNTER — Other Ambulatory Visit: Payer: Self-pay | Admitting: Nurse Practitioner

## 2017-04-03 ENCOUNTER — Telehealth: Payer: Self-pay | Admitting: Nurse Practitioner

## 2017-04-03 DIAGNOSIS — M545 Low back pain, unspecified: Secondary | ICD-10-CM

## 2017-04-03 DIAGNOSIS — E138 Other specified diabetes mellitus with unspecified complications: Secondary | ICD-10-CM

## 2017-04-03 MED ORDER — GLIPIZIDE ER 10 MG PO TB24: 10.0000 mg | ORAL_TABLET | Freq: Every day | ORAL | 1 refills | Status: DC

## 2017-04-03 MED ORDER — METFORMIN HCL 1000 MG PO TABS: 1000.0000 mg | ORAL_TABLET | Freq: Two times a day (BID) | ORAL | 3 refills | Status: DC

## 2017-04-03 NOTE — Telephone Encounter (Signed)
I checked with the pharmacy and we think she has a deductible to meet for her medications. She would have to call her insurance company to confirm this but she will likely need generic medications until she meets that deductible for cost reasons. Generic not available for Janumet, may have to consider metformin and glipizide. Will forward to PCP for review.

## 2017-04-03 NOTE — Telephone Encounter (Signed)
I have ordered a refill of her glipizide as 34m instead of 5 and she can continue on the metformin 10027mBID until her deductible is met. Thank you

## 2017-04-03 NOTE — Telephone Encounter (Signed)
Please call the patients daughter back the medication for the patients dm is over 500. They want a substituent.

## 2017-04-04 NOTE — Telephone Encounter (Signed)
CMA attempt to call patient to inform her Rx has been sent. No answer and left a message for patient to call back.  If patient call back, please inform:  I have ordered a refill of her glipizide as 66m instead of 5 and she can continue on the metformin 10077mBID until her deductible is met. Thank you

## 2017-04-05 NOTE — Telephone Encounter (Signed)
Noted  

## 2017-04-06 NOTE — Telephone Encounter (Signed)
Called and spoke with the patients daughter and informed her that the me medication was ready for pick up

## 2017-04-11 ENCOUNTER — Emergency Department (HOSPITAL_COMMUNITY): Payer: 59

## 2017-04-11 ENCOUNTER — Inpatient Hospital Stay (HOSPITAL_COMMUNITY)
Admission: EM | Admit: 2017-04-11 | Discharge: 2017-04-15 | DRG: 489 | Disposition: A | Discharge status: Home Health | Payer: 59 | Attending: Internal Medicine | Admitting: Internal Medicine

## 2017-04-11 ENCOUNTER — Encounter (HOSPITAL_COMMUNITY): Payer: Self-pay | Admitting: Radiology

## 2017-04-11 DIAGNOSIS — T148XXA Other injury of unspecified body region, initial encounter: Secondary | ICD-10-CM

## 2017-04-11 DIAGNOSIS — S43401A Unspecified sprain of right shoulder joint, initial encounter: Secondary | ICD-10-CM | POA: Diagnosis present

## 2017-04-11 DIAGNOSIS — S83282A Other tear of lateral meniscus, current injury, left knee, initial encounter: Secondary | ICD-10-CM | POA: Diagnosis present

## 2017-04-11 DIAGNOSIS — I10 Essential (primary) hypertension: Secondary | ICD-10-CM | POA: Diagnosis present

## 2017-04-11 DIAGNOSIS — E119 Type 2 diabetes mellitus without complications: Secondary | ICD-10-CM | POA: Diagnosis present

## 2017-04-11 DIAGNOSIS — E118 Type 2 diabetes mellitus with unspecified complications: Secondary | ICD-10-CM

## 2017-04-11 DIAGNOSIS — E785 Hyperlipidemia, unspecified: Secondary | ICD-10-CM | POA: Diagnosis present

## 2017-04-11 DIAGNOSIS — Z7982 Long term (current) use of aspirin: Secondary | ICD-10-CM

## 2017-04-11 DIAGNOSIS — R402142 Coma scale, eyes open, spontaneous, at arrival to emergency department: Secondary | ICD-10-CM | POA: Diagnosis present

## 2017-04-11 DIAGNOSIS — S82142A Displaced bicondylar fracture of left tibia, initial encounter for closed fracture: Principal | ICD-10-CM | POA: Diagnosis present

## 2017-04-11 DIAGNOSIS — Z419 Encounter for procedure for purposes other than remedying health state, unspecified: Secondary | ICD-10-CM

## 2017-04-11 DIAGNOSIS — Z833 Family history of diabetes mellitus: Secondary | ICD-10-CM

## 2017-04-11 DIAGNOSIS — R402252 Coma scale, best verbal response, oriented, at arrival to emergency department: Secondary | ICD-10-CM | POA: Diagnosis present

## 2017-04-11 DIAGNOSIS — S82122A Displaced fracture of lateral condyle of left tibia, initial encounter for closed fracture: Secondary | ICD-10-CM | POA: Diagnosis not present

## 2017-04-11 DIAGNOSIS — R590 Localized enlarged lymph nodes: Secondary | ICD-10-CM | POA: Diagnosis not present

## 2017-04-11 DIAGNOSIS — Z7984 Long term (current) use of oral hypoglycemic drugs: Secondary | ICD-10-CM | POA: Diagnosis not present

## 2017-04-11 DIAGNOSIS — E1169 Type 2 diabetes mellitus with other specified complication: Secondary | ICD-10-CM

## 2017-04-11 DIAGNOSIS — S83262A Peripheral tear of lateral meniscus, current injury, left knee, initial encounter: Secondary | ICD-10-CM | POA: Diagnosis present

## 2017-04-11 DIAGNOSIS — Z7951 Long term (current) use of inhaled steroids: Secondary | ICD-10-CM | POA: Diagnosis not present

## 2017-04-11 DIAGNOSIS — S82122D Displaced fracture of lateral condyle of left tibia, subsequent encounter for closed fracture with routine healing: Secondary | ICD-10-CM | POA: Diagnosis not present

## 2017-04-11 DIAGNOSIS — R402362 Coma scale, best motor response, obeys commands, at arrival to emergency department: Secondary | ICD-10-CM | POA: Diagnosis present

## 2017-04-11 LAB — I-STAT CHEM 8, ED
BUN: 18 mg/dL (ref 6–20)
CALCIUM ION: 1.16 mmol/L (ref 1.15–1.40)
CREATININE: 0.7 mg/dL (ref 0.44–1.00)
Chloride: 98 mmol/L — ABNORMAL LOW (ref 101–111)
GLUCOSE: 180 mg/dL — AB (ref 65–99)
HCT: 46 % (ref 36.0–46.0)
HEMOGLOBIN: 15.6 g/dL — AB (ref 12.0–15.0)
Potassium: 3.4 mmol/L — ABNORMAL LOW (ref 3.5–5.1)
Sodium: 139 mmol/L (ref 135–145)
TCO2: 28 mmol/L (ref 22–32)

## 2017-04-11 LAB — CBC WITH DIFFERENTIAL/PLATELET
BASOS ABS: 0 10*3/uL (ref 0.0–0.1)
BASOS PCT: 0 %
EOS PCT: 7 %
Eosinophils Absolute: 0.9 10*3/uL — ABNORMAL HIGH (ref 0.0–0.7)
HEMATOCRIT: 43.8 % (ref 36.0–46.0)
Hemoglobin: 15.3 g/dL — ABNORMAL HIGH (ref 12.0–15.0)
Lymphocytes Relative: 26 %
Lymphs Abs: 3.2 10*3/uL (ref 0.7–4.0)
MCH: 32.3 pg (ref 26.0–34.0)
MCHC: 34.9 g/dL (ref 30.0–36.0)
MCV: 92.6 fL (ref 78.0–100.0)
MONO ABS: 0.5 10*3/uL (ref 0.1–1.0)
MONOS PCT: 4 %
NEUTROS ABS: 7.8 10*3/uL — AB (ref 1.7–7.7)
Neutrophils Relative %: 63 %
PLATELETS: 226 10*3/uL (ref 150–400)
RBC: 4.73 MIL/uL (ref 3.87–5.11)
RDW: 12.4 % (ref 11.5–15.5)
WBC: 12.4 10*3/uL — ABNORMAL HIGH (ref 4.0–10.5)

## 2017-04-11 LAB — BASIC METABOLIC PANEL
ANION GAP: 10 (ref 5–15)
BUN: 19 mg/dL (ref 6–20)
CALCIUM: 9.6 mg/dL (ref 8.9–10.3)
CO2: 29 mmol/L (ref 22–32)
Chloride: 98 mmol/L — ABNORMAL LOW (ref 101–111)
Creatinine, Ser: 0.67 mg/dL (ref 0.44–1.00)
GLUCOSE: 180 mg/dL — AB (ref 65–99)
Potassium: 3.4 mmol/L — ABNORMAL LOW (ref 3.5–5.1)
Sodium: 137 mmol/L (ref 135–145)

## 2017-04-11 MED ORDER — MORPHINE SULFATE (PF) 4 MG/ML IV SOLN
4.0000 mg | Freq: Once | INTRAVENOUS | Status: AC
  Administered 2017-04-11: 4 mg via INTRAVENOUS
  Filled 2017-04-11: qty 1

## 2017-04-11 MED ORDER — IOPAMIDOL (ISOVUE-300) INJECTION 61%
INTRAVENOUS | Status: AC
  Administered 2017-04-11: 100 mL
  Filled 2017-04-11: qty 100

## 2017-04-11 MED ORDER — TETANUS-DIPHTH-ACELL PERTUSSIS 5-2.5-18.5 LF-MCG/0.5 IM SUSP
0.5000 mL | Freq: Once | INTRAMUSCULAR | Status: AC
  Administered 2017-04-11: 0.5 mL via INTRAMUSCULAR
  Filled 2017-04-11: qty 0.5

## 2017-04-11 MED ORDER — HYDROCODONE-ACETAMINOPHEN 5-325 MG PO TABS
1.0000 | ORAL_TABLET | Freq: Four times a day (QID) | ORAL | Status: DC | PRN
  Administered 2017-04-12: 1 via ORAL
  Administered 2017-04-13: 2 via ORAL
  Administered 2017-04-14 – 2017-04-15 (×3): 1 via ORAL
  Filled 2017-04-11 (×2): qty 1
  Filled 2017-04-11: qty 2
  Filled 2017-04-11 (×2): qty 1

## 2017-04-11 MED ORDER — INSULIN ASPART 100 UNIT/ML ~~LOC~~ SOLN
0.0000 [IU] | SUBCUTANEOUS | Status: DC
  Administered 2017-04-12: 7 [IU] via SUBCUTANEOUS
  Administered 2017-04-12: 3 [IU] via SUBCUTANEOUS
  Filled 2017-04-11 (×2): qty 1

## 2017-04-11 MED ORDER — MORPHINE SULFATE (PF) 2 MG/ML IV SOLN: 0.5000 mg | INTRAVENOUS | Status: DC | PRN

## 2017-04-11 MED ORDER — MORPHINE SULFATE (PF) 4 MG/ML IV SOLN: 6.0000 mg | Freq: Once | INTRAVENOUS | Status: DC

## 2017-04-11 MED ORDER — ONDANSETRON HCL 4 MG/2ML IJ SOLN
4.0000 mg | Freq: Once | INTRAMUSCULAR | Status: AC
  Administered 2017-04-11: 4 mg via INTRAVENOUS
  Filled 2017-04-11: qty 2

## 2017-04-11 MED ORDER — HYDROMORPHONE HCL 1 MG/ML IJ SOLN
0.5000 mg | Freq: Once | INTRAMUSCULAR | Status: AC
  Administered 2017-04-11: 0.5 mg via INTRAVENOUS
  Filled 2017-04-11: qty 1

## 2017-04-11 NOTE — ED Notes (Signed)
Bed: [facility] Expected date:  Expected time:  Means of arrival:  Comments: No bed 
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
## 2017-04-12 ENCOUNTER — Encounter (HOSPITAL_COMMUNITY): Payer: Self-pay

## 2017-04-12 ENCOUNTER — Other Ambulatory Visit: Payer: Self-pay

## 2017-04-12 DIAGNOSIS — R590 Localized enlarged lymph nodes: Secondary | ICD-10-CM | POA: Diagnosis present

## 2017-04-12 LAB — URINALYSIS, ROUTINE W REFLEX MICROSCOPIC
Bilirubin Urine: NEGATIVE
GLUCOSE, UA: 50 mg/dL — AB
Hgb urine dipstick: NEGATIVE
Ketones, ur: 5 mg/dL — AB
LEUKOCYTES UA: NEGATIVE
NITRITE: NEGATIVE
PH: 5 (ref 5.0–8.0)
Protein, ur: NEGATIVE mg/dL
Specific Gravity, Urine: >1.046 — ABNORMAL HIGH (ref 1.005–1.030)

## 2017-04-12 LAB — CBG MONITORING, ED
GLUCOSE-CAPILLARY: 287 mg/dL — AB (ref 65–99)
Glucose-Capillary: 238 mg/dL — ABNORMAL HIGH (ref 65–99)
Glucose-Capillary: 303 mg/dL — ABNORMAL HIGH (ref 65–99)

## 2017-04-12 LAB — HIV ANTIBODY (ROUTINE TESTING W REFLEX): HIV Screen 4th Generation wRfx: NONREACTIVE

## 2017-04-12 LAB — GLUCOSE, CAPILLARY
GLUCOSE-CAPILLARY: 274 mg/dL — AB (ref 65–99)
Glucose-Capillary: 178 mg/dL — ABNORMAL HIGH (ref 65–99)
Glucose-Capillary: 150 mg/dL — ABNORMAL HIGH (ref 65–99)

## 2017-04-12 MED ORDER — POVIDONE-IODINE 10 % EX SWAB
2.0000 "application " | Freq: Once | CUTANEOUS | Status: DC
  Administered 2017-04-13: 2 via TOPICAL

## 2017-04-12 MED ORDER — POTASSIUM CHLORIDE CRYS ER 20 MEQ PO TBCR
40.0000 meq | EXTENDED_RELEASE_TABLET | Freq: Once | ORAL | Status: AC
  Administered 2017-04-12: 40 meq via ORAL
  Filled 2017-04-12: qty 2

## 2017-04-12 MED ORDER — CEFAZOLIN SODIUM-DEXTROSE 2-4 GM/100ML-% IV SOLN
2.0000 g | INTRAVENOUS | Status: AC
  Administered 2017-04-13: 2 g via INTRAVENOUS
  Filled 2017-04-12 (×2): qty 100

## 2017-04-12 MED ORDER — SODIUM CHLORIDE 0.9 % IV SOLN
INTRAVENOUS | Status: AC
  Administered 2017-04-12: 16:00:00 via INTRAVENOUS

## 2017-04-12 MED ORDER — KETOROLAC TROMETHAMINE 15 MG/ML IJ SOLN
15.0000 mg | Freq: Four times a day (QID) | INTRAMUSCULAR | Status: AC
  Administered 2017-04-12 – 2017-04-13 (×3): 15 mg via INTRAVENOUS
  Filled 2017-04-12 (×3): qty 1

## 2017-04-12 MED ORDER — INSULIN ASPART 100 UNIT/ML ~~LOC~~ SOLN
0.0000 [IU] | SUBCUTANEOUS | Status: DC
  Administered 2017-04-12: 3 [IU] via SUBCUTANEOUS
  Administered 2017-04-12 (×2): 8 [IU] via SUBCUTANEOUS
  Administered 2017-04-13 (×2): 2 [IU] via SUBCUTANEOUS
  Administered 2017-04-13: 8 [IU] via SUBCUTANEOUS
  Administered 2017-04-13: 3 [IU] via SUBCUTANEOUS
  Administered 2017-04-14: 5 [IU] via SUBCUTANEOUS
  Administered 2017-04-14: 2 [IU] via SUBCUTANEOUS
  Administered 2017-04-14: 5 [IU] via SUBCUTANEOUS
  Administered 2017-04-14 – 2017-04-15 (×4): 3 [IU] via SUBCUTANEOUS
  Filled 2017-04-12: qty 1

## 2017-04-12 MED ORDER — CHLORHEXIDINE GLUCONATE 4 % EX LIQD
60.0000 mL | Freq: Once | CUTANEOUS | Status: DC
  Administered 2017-04-13: 4 via TOPICAL
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
## 2017-04-12 NOTE — Plan of Care (Signed)
  Progressing Education: Knowledge of General Education information will improve 04/12/2017 1408 - Progressing by Rance Muir, RN Health Behavior/Discharge Planning: Ability to manage health-related needs will improve 04/12/2017 1408 - Progressing by Rance Muir, RN Clinical Measurements: Ability to maintain clinical measurements within normal limits will improve 04/12/2017 1408 - Progressing by Rance Muir, RN Will remain free from infection 04/12/2017 1408 - Progressing by Rance Muir, RN Diagnostic test results will improve 04/12/2017 1408 - Progressing by Rance Muir, RN Respiratory complications will improve 04/12/2017 1408 - Progressing by Rance Muir, RN Cardiovascular complication will be avoided 04/12/2017 1408 - Progressing by Rance Muir, RN Activity: Risk for activity intolerance will decrease 04/12/2017 1408 - Progressing by Rance Muir, RN Nutrition: Adequate nutrition will be maintained 04/12/2017 1408 - Progressing by Rance Muir, RN Coping: Level of anxiety will decrease 04/12/2017 1408 - Progressing by Rance Muir, RN Elimination: Will not experience complications related to bowel motility 04/12/2017 1408 - Progressing by Rance Muir, RN Will not experience complications related to urinary retention 04/12/2017 1408 - Progressing by Rance Muir, RN Pain Managment: General experience of comfort will improve 04/12/2017 1408 - Progressing by Rance Muir, RN Safety: Ability to remain free from injury will improve 04/12/2017 1408 - Progressing by Rance Muir, RN Skin Integrity: Risk for impaired skin integrity will decrease 04/12/2017 1408 - Progressing by Rance Muir, RN
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
## 2017-04-12 NOTE — ED Notes (Signed)
[facility] arrival. Pt en route to [facility]

## 2017-04-13 ENCOUNTER — Inpatient Hospital Stay (HOSPITAL_COMMUNITY): Payer: 59

## 2017-04-13 ENCOUNTER — Inpatient Hospital Stay (HOSPITAL_COMMUNITY): Payer: 59 | Admitting: Certified Registered Nurse Anesthetist

## 2017-04-13 ENCOUNTER — Encounter (HOSPITAL_COMMUNITY): Payer: Self-pay | Admitting: Orthopedic Surgery

## 2017-04-13 ENCOUNTER — Encounter (HOSPITAL_COMMUNITY)
Admission: EM | Disposition: A | Discharge status: Home Health | Payer: Self-pay | Source: Home / Self Care | Attending: Internal Medicine

## 2017-04-13 DIAGNOSIS — I10 Essential (primary) hypertension: Secondary | ICD-10-CM

## 2017-04-13 DIAGNOSIS — S82122A Displaced fracture of lateral condyle of left tibia, initial encounter for closed fracture: Secondary | ICD-10-CM

## 2017-04-13 DIAGNOSIS — E118 Type 2 diabetes mellitus with unspecified complications: Secondary | ICD-10-CM

## 2017-04-13 DIAGNOSIS — R590 Localized enlarged lymph nodes: Secondary | ICD-10-CM

## 2017-04-13 DIAGNOSIS — S82142A Displaced bicondylar fracture of left tibia, initial encounter for closed fracture: Principal | ICD-10-CM

## 2017-04-13 HISTORY — PX: ORIF TIBIA PLATEAU: SHX2132

## 2017-04-13 LAB — GLUCOSE, CAPILLARY
GLUCOSE-CAPILLARY: 172 mg/dL — AB (ref 65–99)
Glucose-Capillary: 143 mg/dL — ABNORMAL HIGH (ref 65–99)
Glucose-Capillary: 142 mg/dL — ABNORMAL HIGH (ref 65–99)
Glucose-Capillary: 219 mg/dL — ABNORMAL HIGH (ref 65–99)
Glucose-Capillary: 252 mg/dL — ABNORMAL HIGH (ref 65–99)
Glucose-Capillary: 317 mg/dL — ABNORMAL HIGH (ref 65–99)

## 2017-04-13 LAB — BASIC METABOLIC PANEL
Anion gap: 8 (ref 5–15)
BUN: 22 mg/dL — AB (ref 6–20)
CHLORIDE: 100 mmol/L — AB (ref 101–111)
CO2: 27 mmol/L (ref 22–32)
CREATININE: 0.68 mg/dL (ref 0.44–1.00)
Calcium: 8.5 mg/dL — ABNORMAL LOW (ref 8.9–10.3)
GFR calc Af Amer: >60 mL/min (ref >60)
GFR calc non Af Amer: >60 mL/min (ref >60)
Glucose, Bld: 140 mg/dL — ABNORMAL HIGH (ref 65–99)
Potassium: 3.9 mmol/L (ref 3.5–5.1)
SODIUM: 135 mmol/L (ref 135–145)

## 2017-04-13 LAB — CBC
HCT: 34.3 % — ABNORMAL LOW (ref 36.0–46.0)
Hemoglobin: 11.1 g/dL — ABNORMAL LOW (ref 12.0–15.0)
MCH: 30.3 pg (ref 26.0–34.0)
MCHC: 32.4 g/dL (ref 30.0–36.0)
MCV: 93.7 fL (ref 78.0–100.0)
Platelets: 170 10*3/uL (ref 150–400)
RBC: 3.66 MIL/uL — ABNORMAL LOW (ref 3.87–5.11)
RDW: 13 % (ref 11.5–15.5)
WBC: 9 10*3/uL (ref 4.0–10.5)

## 2017-04-13 LAB — SURGICAL PCR SCREEN
MRSA, PCR: NEGATIVE
STAPHYLOCOCCUS AUREUS: NEGATIVE

## 2017-04-13 SURGERY — OPEN REDUCTION INTERNAL FIXATION (ORIF) TIBIAL PLATEAU
Anesthesia: General | Laterality: Left

## 2017-04-13 MED ORDER — PHENYLEPHRINE HCL 10 MG/ML IJ SOLN
INTRAMUSCULAR | Status: DC | PRN
  Administered 2017-04-13 (×2): 80 ug via INTRAVENOUS

## 2017-04-13 MED ORDER — VANCOMYCIN HCL 1000 MG IV SOLR
INTRAVENOUS | Status: AC
  Filled 2017-04-13: qty 1000

## 2017-04-13 MED ORDER — KETOROLAC TROMETHAMINE 15 MG/ML IJ SOLN
15.0000 mg | Freq: Four times a day (QID) | INTRAMUSCULAR | Status: AC
  Administered 2017-04-13 (×2): 15 mg via INTRAVENOUS
  Filled 2017-04-13 (×2): qty 1

## 2017-04-13 MED ORDER — MEPERIDINE HCL 50 MG/ML IJ SOLN: 6.2500 mg | INTRAMUSCULAR | Status: DC | PRN

## 2017-04-13 MED ORDER — BACITRACIN 500 UNIT/GM EX OINT
TOPICAL_OINTMENT | CUTANEOUS | Status: DC | PRN
  Administered 2017-04-13: 1 via TOPICAL

## 2017-04-13 MED ORDER — CEFAZOLIN SODIUM-DEXTROSE 2-4 GM/100ML-% IV SOLN: 2.0000 g | Freq: Three times a day (TID) | INTRAVENOUS | Status: DC

## 2017-04-13 MED ORDER — LACTATED RINGERS IV SOLN
INTRAVENOUS | Status: DC
  Administered 2017-04-13 – 2017-04-14 (×3): via INTRAVENOUS

## 2017-04-13 MED ORDER — MIDAZOLAM HCL 2 MG/2ML IJ SOLN
INTRAMUSCULAR | Status: DC | PRN
  Administered 2017-04-13 (×2): 1 mg via INTRAVENOUS

## 2017-04-13 MED ORDER — PANTOPRAZOLE SODIUM 40 MG PO TBEC
40.0000 mg | DELAYED_RELEASE_TABLET | Freq: Every day | ORAL | Status: DC
  Administered 2017-04-14 – 2017-04-15 (×2): 40 mg via ORAL
  Filled 2017-04-13 (×2): qty 1

## 2017-04-13 MED ORDER — BUPIVACAINE HCL (PF) 0.25 % IJ SOLN
INTRAMUSCULAR | Status: AC
  Filled 2017-04-13: qty 30

## 2017-04-13 MED ORDER — 0.9 % SODIUM CHLORIDE (POUR BTL) OPTIME
TOPICAL | Status: DC | PRN
  Administered 2017-04-13: 1000 mL

## 2017-04-13 MED ORDER — VANCOMYCIN HCL 1000 MG IV SOLR
INTRAVENOUS | Status: DC | PRN
  Administered 2017-04-13: 1000 mg via TOPICAL

## 2017-04-13 MED ORDER — BACITRACIN ZINC 500 UNIT/GM EX OINT
TOPICAL_OINTMENT | CUTANEOUS | Status: AC
  Filled 2017-04-13: qty 28.35

## 2017-04-13 MED ORDER — ENOXAPARIN SODIUM 40 MG/0.4ML ~~LOC~~ SOLN
40.0000 mg | SUBCUTANEOUS | Status: DC
  Administered 2017-04-13 – 2017-04-14 (×2): 40 mg via SUBCUTANEOUS
  Filled 2017-04-13 (×2): qty 0.4

## 2017-04-13 MED ORDER — DEXAMETHASONE SODIUM PHOSPHATE 10 MG/ML IJ SOLN
INTRAMUSCULAR | Status: DC | PRN
  Administered 2017-04-13: 10 mg via INTRAVENOUS

## 2017-04-13 MED ORDER — ROCURONIUM BROMIDE 10 MG/ML (PF) SYRINGE
PREFILLED_SYRINGE | INTRAVENOUS | Status: DC | PRN
  Administered 2017-04-13: 40 mg via INTRAVENOUS
  Administered 2017-04-13: 10 mg via INTRAVENOUS

## 2017-04-13 MED ORDER — HYDROMORPHONE HCL 1 MG/ML IJ SOLN
INTRAMUSCULAR | Status: AC
  Filled 2017-04-13: qty 1

## 2017-04-13 MED ORDER — HYDROMORPHONE HCL 1 MG/ML IJ SOLN
0.2500 mg | INTRAMUSCULAR | Status: DC | PRN
  Administered 2017-04-13 (×3): 0.5 mg via INTRAVENOUS

## 2017-04-13 MED ORDER — KETOROLAC TROMETHAMINE 30 MG/ML IJ SOLN
INTRAMUSCULAR | Status: AC
  Filled 2017-04-13: qty 1

## 2017-04-13 MED ORDER — ACETAMINOPHEN 10 MG/ML IV SOLN
INTRAVENOUS | Status: DC | PRN
  Administered 2017-04-13: 1000 mg via INTRAVENOUS

## 2017-04-13 MED ORDER — ACETAMINOPHEN 10 MG/ML IV SOLN
INTRAVENOUS | Status: AC
  Filled 2017-04-13: qty 100

## 2017-04-13 MED ORDER — FENTANYL CITRATE (PF) 250 MCG/5ML IJ SOLN
INTRAMUSCULAR | Status: AC
  Filled 2017-04-13: qty 5

## 2017-04-13 MED ORDER — FENTANYL CITRATE (PF) 250 MCG/5ML IJ SOLN
INTRAMUSCULAR | Status: DC | PRN
  Administered 2017-04-13: 50 ug via INTRAVENOUS
  Administered 2017-04-13: 100 ug via INTRAVENOUS
  Administered 2017-04-13 (×3): 50 ug via INTRAVENOUS

## 2017-04-13 MED ORDER — KETOROLAC TROMETHAMINE 30 MG/ML IJ SOLN
30.0000 mg | Freq: Once | INTRAMUSCULAR | Status: DC | PRN
  Administered 2017-04-13: 30 mg via INTRAVENOUS

## 2017-04-13 MED ORDER — ONDANSETRON HCL 4 MG/2ML IJ SOLN
INTRAMUSCULAR | Status: DC | PRN
  Administered 2017-04-13: 4 mg via INTRAVENOUS

## 2017-04-13 MED ORDER — SUGAMMADEX SODIUM 200 MG/2ML IV SOLN
INTRAVENOUS | Status: DC | PRN
  Administered 2017-04-13: 120.6 mg via INTRAVENOUS

## 2017-04-13 MED ORDER — PROMETHAZINE HCL 25 MG/ML IJ SOLN: 6.2500 mg | INTRAMUSCULAR | Status: DC | PRN

## 2017-04-13 MED ORDER — MIDAZOLAM HCL 2 MG/2ML IJ SOLN
INTRAMUSCULAR | Status: AC
  Filled 2017-04-13: qty 2

## 2017-04-13 MED ORDER — PROPOFOL 10 MG/ML IV BOLUS
INTRAVENOUS | Status: DC | PRN
  Administered 2017-04-13: 100 mg via INTRAVENOUS

## 2017-04-13 MED ORDER — CEFAZOLIN SODIUM-DEXTROSE 2-4 GM/100ML-% IV SOLN
2.0000 g | Freq: Three times a day (TID) | INTRAVENOUS | Status: AC
  Administered 2017-04-13 – 2017-04-14 (×2): 2 g via INTRAVENOUS
  Filled 2017-04-13 (×3): qty 100

## 2017-04-13 SURGICAL SUPPLY — 81 items
BANDAGE ACE 4X5 VEL STRL LF (GAUZE/BANDAGES/DRESSINGS) ×2 IMPLANT
BANDAGE ACE 6X5 VEL STRL LF (GAUZE/BANDAGES/DRESSINGS) ×2 IMPLANT
BANDAGE ESMARK 6X9 LF (GAUZE/BANDAGES/DRESSINGS) ×1 IMPLANT
BIT DRILL 100X2.5XANTM LCK (BIT) IMPLANT
BIT DRILL CAL (BIT) IMPLANT
BIT DRL 100X2.5XANTM LCK (BIT) ×1
BLADE CLIPPER SURG (BLADE) IMPLANT
BLADE SURG 15 STRL LF DISP TIS (BLADE) ×1 IMPLANT
BLADE SURG 15 STRL SS (BLADE) ×2
BNDG CMPR 9X6 STRL LF SNTH (GAUZE/BANDAGES/DRESSINGS) ×1
BNDG ESMARK 6X9 LF (GAUZE/BANDAGES/DRESSINGS) ×2
BNDG GAUZE ELAST 4 BULKY (GAUZE/BANDAGES/DRESSINGS) ×2 IMPLANT
BONE CANC CHIPS 20CC PCAN1/4 (Bone Implant) ×2 IMPLANT
BRUSH SCRUB SURG 4.25 DISP (MISCELLANEOUS) ×4 IMPLANT
CANISTER SUCT 3000ML PPV (MISCELLANEOUS) ×2 IMPLANT
CHIPS CANC BONE 20CC PCAN1/4 (Bone Implant) ×1 IMPLANT
CHLORAPREP W/TINT 26ML (MISCELLANEOUS) ×4 IMPLANT
COVER SURGICAL LIGHT HANDLE (MISCELLANEOUS) ×3 IMPLANT
CUFF TOURNIQUET SINGLE 34IN LL (TOURNIQUET CUFF) ×2 IMPLANT
DRAPE C-ARM 42X72 X-RAY (DRAPES) ×2 IMPLANT
DRAPE C-ARMOR (DRAPES) ×2 IMPLANT
DRAPE HALF SHEET 40X57 (DRAPES) ×1 IMPLANT
DRAPE ORTHO SPLIT 77X108 STRL (DRAPES) ×4
DRAPE SURG ORHT 6 SPLT 77X108 (DRAPES) ×2 IMPLANT
DRAPE U-SHAPE 47X51 STRL (DRAPES) ×2 IMPLANT
DRILL BIT 2.5MM (BIT) ×2
DRILL BIT CAL (BIT) ×2
DRSG ADAPTIC 3X8 NADH LF (GAUZE/BANDAGES/DRESSINGS) ×1 IMPLANT
DRSG PAD ABDOMINAL 8X10 ST (GAUZE/BANDAGES/DRESSINGS) ×4 IMPLANT
ELECT REM PT RETURN 9FT ADLT (ELECTROSURGICAL) ×2
ELECTRODE REM PT RTRN 9FT ADLT (ELECTROSURGICAL) ×1 IMPLANT
GAUZE SPONGE 4X4 12PLY STRL (GAUZE/BANDAGES/DRESSINGS) ×2 IMPLANT
GLOVE BIO SURGEON STRL SZ7.5 (GLOVE) ×8 IMPLANT
GLOVE BIOGEL PI IND STRL 7.5 (GLOVE) ×1 IMPLANT
GLOVE BIOGEL PI INDICATOR 7.5 (GLOVE) ×1
GOWN STRL REUS W/ TWL LRG LVL3 (GOWN DISPOSABLE) ×2 IMPLANT
GOWN STRL REUS W/TWL LRG LVL3 (GOWN DISPOSABLE) ×4
GRAFT BNE CANC CHIPS 1-8 20CC (Bone Implant) IMPLANT
IMMOBILIZER KNEE 22 UNIV (SOFTGOODS) ×1 IMPLANT
K-WIRE ACE 1.6X6 (WIRE) ×10
KIT BASIN OR (CUSTOM PROCEDURE TRAY) ×2 IMPLANT
KIT ROOM TURNOVER OR (KITS) ×2 IMPLANT
KWIRE ACE 1.6X6 (WIRE) IMPLANT
NDL SUT .5 MAYO 1.404X.05X (NEEDLE) IMPLANT
NDL SUT 6 .5 CRC .975X.05 MAYO (NEEDLE) ×1 IMPLANT
NEEDLE MAYO TAPER (NEEDLE) ×6
NS IRRIG 1000ML POUR BTL (IV SOLUTION) ×2 IMPLANT
PACK TOTAL JOINT (CUSTOM PROCEDURE TRAY) ×2 IMPLANT
PAD ARMBOARD 7.5X6 YLW CONV (MISCELLANEOUS) ×4 IMPLANT
PAD CAST 4YDX4 CTTN HI CHSV (CAST SUPPLIES) ×1 IMPLANT
PADDING CAST COTTON 4X4 STRL (CAST SUPPLIES) ×2
PADDING CAST COTTON 6X4 STRL (CAST SUPPLIES) ×2 IMPLANT
PLATE LOCK 5H STD LT PROX TIB (Plate) ×1 IMPLANT
SCREW 3.5MM CORT LP 34MM (Screw) ×1 IMPLANT
SCREW CORT T15 30X3.5XST LCK (Screw) IMPLANT
SCREW CORTICAL 3.5X30MM (Screw) ×2 IMPLANT
SCREW LOCK 3.5X34 DIST TIB (Screw) ×1 IMPLANT
SCREW LOCK 3.5X65 DIST TIB (Screw) ×1 IMPLANT
SCREW LOCK CORT STAR 3.5X40 (Screw) ×1 IMPLANT
SCREW LOCK CORT STAR 3.5X60 (Screw) ×1 IMPLANT
SCREW LP 3.5X70MM (Screw) ×1 IMPLANT
STAPLER VISISTAT 35W (STAPLE) ×1 IMPLANT
SUCTION FRAZIER HANDLE 10FR (MISCELLANEOUS) ×1
SUCTION TUBE FRAZIER 10FR DISP (MISCELLANEOUS) ×1 IMPLANT
SUT ETHILON 2 0 FS 18 (SUTURE) ×2 IMPLANT
SUT ETHILON 3 0 PS 1 (SUTURE) ×2 IMPLANT
SUT FIBERWIRE #2 38 T-5 BLUE (SUTURE) ×2
SUT VIC AB 0 CT1 18XCR BRD 8 (SUTURE) IMPLANT
SUT VIC AB 0 CT1 27 (SUTURE)
SUT VIC AB 0 CT1 27XBRD ANBCTR (SUTURE) IMPLANT
SUT VIC AB 0 CT1 8-18 (SUTURE) ×2
SUT VIC AB 1 CT1 18XCR BRD 8 (SUTURE) IMPLANT
SUT VIC AB 1 CT1 27 (SUTURE) ×1
SUT VIC AB 1 CT1 27XBRD ANBCTR (SUTURE) ×1 IMPLANT
SUT VIC AB 1 CT1 8-18 (SUTURE)
SUT VIC AB 2-0 CT1 27 (SUTURE) ×4
SUT VIC AB 2-0 CT1 TAPERPNT 27 (SUTURE) ×2 IMPLANT
SUTURE FIBERWR #2 38 T-5 BLUE (SUTURE) IMPLANT
TOWEL OR 17X26 10 PK STRL BLUE (TOWEL DISPOSABLE) ×4 IMPLANT
TRAY FOLEY W/METER SILVER 16FR (SET/KITS/TRAYS/PACK) IMPLANT
WATER STERILE IRR 1000ML POUR (IV SOLUTION) ×4 IMPLANT

## 2017-04-13 NOTE — Anesthesia Preprocedure Evaluation (Signed)
Anesthesia Evaluation    Airway Mallampati: II       Dental  (+) Caps,    Pulmonary neg pulmonary ROS,    Pulmonary exam normal        Cardiovascular hypertension, Pt. on medications Normal cardiovascular exam Rhythm:Regular Rate:Normal     Neuro/Psych negative neurological ROS  negative psych ROS   GI/Hepatic negative GI ROS, Neg liver ROS,   Endo/Other  diabetes, Oral Hypoglycemic Agents  Renal/GU negative Renal ROS     Musculoskeletal   Abdominal Normal abdominal exam  (+)   Peds  Hematology  (+) Blood dyscrasia, anemia ,   Anesthesia Other Findings   Reproductive/Obstetrics                             Anesthesia Physical Anesthesia Plan  ASA: II  Anesthesia Plan: General   Post-op Pain Management:    Induction: Intravenous  PONV Risk Score and Plan:   Airway Management Planned: Oral ETT  Additional Equipment:   Intra-op Plan:   Post-operative Plan: Extubation in OR  Informed Consent: I have reviewed the patients History and Physical, chart, labs and discussed the procedure including the risks, benefits and alternatives for the proposed anesthesia with the patient or authorized representative who has indicated his/her understanding and acceptance.   Dental advisory given  Plan Discussed with: CRNA and Surgeon  Anesthesia Plan Comments:         Anesthesia Quick Evaluation

## 2017-04-13 NOTE — Anesthesia Postprocedure Evaluation (Signed)
Anesthesia Post Note  Patient: Amanda Duarte  Procedure(s) Performed: OPEN REDUCTION INTERNAL FIXATION (ORIF) TIBIAL PLATEAU (Left )     Patient location during evaluation: PACU Anesthesia Type: General Level of consciousness: sedated Pain management: pain level controlled Vital Signs Assessment: post-procedure vital signs reviewed and stable Respiratory status: spontaneous breathing Cardiovascular status: stable Postop Assessment: no apparent nausea or vomiting Anesthetic complications: no    Last Vitals:  Vitals:   04/13/17 1500 04/13/17 1515  BP:    Pulse: 67 65  Resp: 14 12  Temp:    SpO2: 99% 98%    Last Pain:  Vitals:   04/13/17 1515  TempSrc:   PainSc: Asleep   Pain Goal:                 Amanda Duarte,Amanda Duarte
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
## 2017-04-13 NOTE — Transfer of Care (Signed)
Immediate Anesthesia Transfer of Care Note  Patient: Amanda Duarte  Procedure(s) Performed: OPEN REDUCTION INTERNAL FIXATION (ORIF) TIBIAL PLATEAU (Left )  Patient Location: PACU  Anesthesia Type:General  Level of Consciousness: awake, alert  and oriented  Airway & Oxygen Therapy: Patient Spontanous Breathing and Patient connected to nasal cannula oxygen  Post-op Assessment: Report given to RN and Post -op Vital signs reviewed and stable  Post vital signs: Reviewed and stable  Last Vitals:  Vitals Value Taken Time  BP    Temp    Pulse    Resp    SpO2      Last Pain:  Vitals:   04/13/17 0458  TempSrc:   PainSc: Asleep         Complications: No apparent anesthesia complications

## 2017-04-13 NOTE — Progress Notes (Signed)
Nutrition Consult/Brief Note  RD consulted per Hip Fracture Protocol.  Pt dx with tibia plateau fracture. Currently in OR for ORIF.  Will complete nutrition assessment at later date.  Arthur Holms, RD, LDN Pager #: [phone] After-Hours Pager #: [phone]

## 2017-04-13 NOTE — Progress Notes (Signed)
Care of pt assumed by MA Larayne Baxley RN 

## 2017-04-13 NOTE — Anesthesia Procedure Notes (Signed)
Procedure Name: Intubation Date/Time: 04/13/2017 11:16 AM Performed by: Clearnce Sorrel, CRNA Pre-anesthesia Checklist: Patient identified, Emergency Drugs available, Suction available, Patient being monitored and Timeout performed Patient Re-evaluated:Patient Re-evaluated prior to induction Oxygen Delivery Method: Circle system utilized Preoxygenation: Pre-oxygenation with 100% oxygen Induction Type: IV induction Ventilation: Mask ventilation without difficulty and Oral airway inserted - appropriate to patient size Laryngoscope Size: Mac and 3 Grade View: Grade I Tube type: Oral Tube size: 6.5 mm Number of attempts: 1 Airway Equipment and Method: Stylet Placement Confirmation: ETT inserted through vocal cords under direct vision,  positive ETCO2 and breath sounds checked- equal and bilateral Secured at: 22 cm Tube secured with: Tape Dental Injury: Teeth and Oropharynx as per pre-operative assessment
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
## 2017-04-13 NOTE — Interval H&P Note (Signed)
History and Physical Interval Note:  04/13/2017 11:06 AM  Amanda Duarte  has presented today for surgery, with the diagnosis of tibia plateau fracture  The various methods of treatment have been discussed with the patient and family. After consideration of risks, benefits and other options for treatment, the patient has consented to  Procedure(s): OPEN REDUCTION INTERNAL FIXATION (ORIF) TIBIAL PLATEAU (Left) as a surgical intervention .  The patient's history has been reviewed, patient examined, no change in status, stable for surgery.  I have reviewed the patient's chart and labs.  Questions were answered to the patient's satisfaction.     Lennette Bihari P [name]

## 2017-04-14 ENCOUNTER — Encounter (HOSPITAL_COMMUNITY): Payer: Self-pay | Admitting: Student

## 2017-04-14 LAB — CBC
HEMATOCRIT: 32.9 % — AB (ref 36.0–46.0)
Hemoglobin: 11.1 g/dL — ABNORMAL LOW (ref 12.0–15.0)
MCH: 31.3 pg (ref 26.0–34.0)
MCHC: 33.7 g/dL (ref 30.0–36.0)
MCV: 92.7 fL (ref 78.0–100.0)
Platelets: 193 10*3/uL (ref 150–400)
RBC: 3.55 MIL/uL — AB (ref 3.87–5.11)
RDW: 12.5 % (ref 11.5–15.5)
WBC: 13 10*3/uL — ABNORMAL HIGH (ref 4.0–10.5)

## 2017-04-14 LAB — BASIC METABOLIC PANEL
Anion gap: 9 (ref 5–15)
BUN: 13 mg/dL (ref 6–20)
CHLORIDE: 101 mmol/L (ref 101–111)
CO2: 26 mmol/L (ref 22–32)
Calcium: 8.8 mg/dL — ABNORMAL LOW (ref 8.9–10.3)
Creatinine, Ser: 0.58 mg/dL (ref 0.44–1.00)
GFR calc Af Amer: >60 mL/min (ref >60)
GFR calc non Af Amer: >60 mL/min (ref >60)
GLUCOSE: 202 mg/dL — AB (ref 65–99)
POTASSIUM: 4.5 mmol/L (ref 3.5–5.1)
Sodium: 136 mmol/L (ref 135–145)

## 2017-04-14 LAB — GLUCOSE, CAPILLARY
GLUCOSE-CAPILLARY: 209 mg/dL — AB (ref 65–99)
GLUCOSE-CAPILLARY: 203 mg/dL — AB (ref 65–99)
GLUCOSE-CAPILLARY: 197 mg/dL — AB (ref 65–99)
Glucose-Capillary: 144 mg/dL — ABNORMAL HIGH (ref 65–99)
Glucose-Capillary: 188 mg/dL — ABNORMAL HIGH (ref 65–99)
Glucose-Capillary: 236 mg/dL — ABNORMAL HIGH (ref 65–99)

## 2017-04-14 MED ORDER — GLUCERNA SHAKE PO LIQD
237.0000 mL | Freq: Every day | ORAL | Status: DC
  Administered 2017-04-14 – 2017-04-15 (×2): 237 mL via ORAL
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
## 2017-04-14 NOTE — Progress Notes (Signed)
04/14/2017 Called [employer] re: order for hinged knee brace.  Pt still in [place] at this time.    Thanks,  Barbarann Ehlers. [place], [place], DPT [phone]

## 2017-04-14 NOTE — Progress Notes (Signed)
Orthopedic Tech Progress Note Patient Details:  Amanda Duarte 15-Jun-1955 [id number]  Patient ID: [name], female   DOB: 23-Nov-1955, 62 y.o.   MRN: [id number] I called to check with RN. Rn stated that pt had knee brace applied.  Karolee Stamps 04/14/2017, 8:49 AM

## 2017-04-15 DIAGNOSIS — S82122D Displaced fracture of lateral condyle of left tibia, subsequent encounter for closed fracture with routine healing: Secondary | ICD-10-CM

## 2017-04-15 DIAGNOSIS — S82142A Displaced bicondylar fracture of left tibia, initial encounter for closed fracture: Secondary | ICD-10-CM

## 2017-04-15 LAB — GLUCOSE, CAPILLARY
GLUCOSE-CAPILLARY: 168 mg/dL — AB (ref 65–99)
GLUCOSE-CAPILLARY: 176 mg/dL — AB (ref 65–99)
Glucose-Capillary: 121 mg/dL — ABNORMAL HIGH (ref 65–99)

## 2017-04-15 MED ORDER — HYDROCODONE-ACETAMINOPHEN 5-325 MG PO TABS: 1.0000 | ORAL_TABLET | Freq: Four times a day (QID) | ORAL | 0 refills | Status: DC | PRN

## 2017-04-15 MED ORDER — SENNA 8.6 MG PO TABS: 1.0000 | ORAL_TABLET | Freq: Every day | ORAL | 0 refills | Status: DC | PRN

## 2017-04-15 NOTE — Progress Notes (Signed)
Subjective: 2 Days Post-Op Procedure(s) (LRB): OPEN REDUCTION INTERNAL FIXATION (ORIF) TIBIAL PLATEAU (Left) Patient reports pain as mild and moderate.    Objective: Vital signs in last 24 hours: Temp:  [98.3 F (36.8 C)-99 F (37.2 C)] 99 F (37.2 C) (03/23 1147) Pulse Rate:  [53-63] 58 (03/23 1147) Resp:  [14-18] 16 (03/23 1147) BP: (119-160)/(47-69) 155/69 (03/23 1147) SpO2:  [96 %-100 %] 100 % (03/23 1147)  Intake/Output from previous day: 03/22 0701 - 03/23 0700 In: 1200 [P.O.:1200] Out: -  Intake/Output this shift: No intake/output data recorded.  Recent Labs    04/13/17 0657 04/14/17 0545  HGB 11.1* 11.1*   Recent Labs    04/13/17 0657 04/14/17 0545  WBC 9.0 13.0*  RBC 3.66* 3.55*  HCT 34.3* 32.9*  PLT 170 193   Recent Labs    04/13/17 0657 04/14/17 0545  NA 135 136  K 3.9 4.5  CL 100* 101  CO2 27 26  BUN 22* 13  CREATININE 0.68 0.58  GLUCOSE 140* 202*  CALCIUM 8.5* 8.8*   No results for input(s): LABPT, INR in the last 72 hours.  Neurovascular intact Sensation intact distally Intact pulses distally Dorsiflexion/Plantar flexion intact Incision: dressing C/D/I, scant drainage and dsg changed  Assessment/Plan: 2 Days Post-Op Procedure(s) (LRB): OPEN REDUCTION INTERNAL FIXATION (ORIF) TIBIAL PLATEAU (Left) Up with therapy  NWB LLE ROM allowed, ROM brace locked at night unlocked day Pain control as ordered lovenox dvt proph Will d/c per primary team   Chriss Czar 04/15/2017, 12:10 PM
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
## 2017-04-15 NOTE — Care Management Note (Signed)
Case Management Note  Patient Details  Name: Amanda Duarte MRN: [id number] Date of Birth: [birth year]  Subjective/Objective:       Pt from home with tibial plateau fracture.  Pt to return home with daughter at address [address] [place], [place] [address].  Pt has no DME and requests RW.  Denies need for 3n1.            Action/Plan: Youth RW ordered and requested from [facility]  Pt has no preference of [place] agency.  Agrees to use [facility].  Expected Discharge Date:  04/15/17               Expected Discharge Plan:  [place]  In-House Referral:  NA  Discharge planning Services  CM Consult  Post Acute Care Choice:  Durable Medical Equipment, Home Health Choice offered to:  Patient, Adult Children  DME Arranged:  [facility] youth DME Agency:  [place] Arranged:  PT, OT [place] Agency:  [place]  Status of Service:  Completed, signed off  If discussed at [place] of Stay Meetings, dates discussed:    Additional Comments:  Claudie Leach, RN 04/15/2017, 10:33 AM

## 2017-04-17 ENCOUNTER — Telehealth: Payer: Self-pay | Admitting: Nurse Practitioner
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
## 2017-04-19 ENCOUNTER — Telehealth: Payer: Self-pay | Admitting: Nurse Practitioner
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
## 2017-04-20 ENCOUNTER — Encounter (HOSPITAL_COMMUNITY): Payer: Self-pay | Admitting: Student

## 2017-04-20 ENCOUNTER — Telehealth: Payer: Self-pay

## 2017-04-20 NOTE — Telephone Encounter (Signed)
She has a mammogram referral that was placed a few weeks ago. I will speak with her at her next office appointment

## 2017-04-20 NOTE — Telephone Encounter (Signed)
CMA left a message for Amanda Duarte that the PCP is currently out of the office and will address it tomorrow when she comes back.

## 2017-04-20 NOTE — Telephone Encounter (Signed)
Amanda Duarte from breast center call regarding wanting more information of why she needs an ultrasound  Amanda Duarte said base on her scan on 03/19 she will need a left axillary ultrasound   Amanda Duarte needs to know if [place] can sign off an urgent form before she send it   Call back number [phone]
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
## 2017-04-24 ENCOUNTER — Other Ambulatory Visit: Payer: Self-pay | Admitting: Nurse Practitioner

## 2017-04-24 ENCOUNTER — Telehealth: Payer: Self-pay | Admitting: Nurse Practitioner

## 2017-04-24 DIAGNOSIS — R9389 Abnormal findings on diagnostic imaging of other specified body structures: Secondary | ICD-10-CM

## 2017-04-24 NOTE — Telephone Encounter (Signed)
CMA left Amanda Duarte a message to call back. PCP [name] did not order the Ultrasound breast for patient.

## 2017-04-24 NOTE — Telephone Encounter (Signed)
Spoke with Amanda Duarte. I personally ordered her mammogram and referral was made on 04-01-2017. It does not appear patient was ever scheduled for that particular mammogram as I ordered. At this time due to recent CT findings it has been requested that I order a diagnostic mammogram with ultrasound for more definitive imaging of patient's breasts.

## 2017-04-24 NOTE — Telephone Encounter (Signed)
Amanda Duarte from the breast center called in hopes of getting pcp to sign off for a diagnostic mammogram.  CB#[phone] ext [phone]

## 2017-04-24 NOTE — Progress Notes (Signed)
di

## 2017-04-24 NOTE — Telephone Encounter (Signed)
Amanda Duarte from breast center call regarding a CT scan   Amanda Duarte spoke with Amanda Duarte and everything was taking care of and resolve

## 2017-04-28 ENCOUNTER — Ambulatory Visit
Admission: RE | Admit: 2017-04-28 | Discharge: 2017-04-28 | Disposition: A | Discharge status: Home / Self Care | Payer: 59 | Source: Ambulatory Visit | Attending: Nurse Practitioner | Admitting: Nurse Practitioner

## 2017-04-28 ENCOUNTER — Other Ambulatory Visit: Payer: Self-pay | Admitting: Nurse Practitioner

## 2017-04-28 ENCOUNTER — Other Ambulatory Visit: Payer: 59

## 2017-04-28 DIAGNOSIS — R9389 Abnormal findings on diagnostic imaging of other specified body structures: Secondary | ICD-10-CM

## 2017-04-28 DIAGNOSIS — R599 Enlarged lymph nodes, unspecified: Secondary | ICD-10-CM

## 2017-05-01 ENCOUNTER — Ambulatory Visit
Admission: RE | Admit: 2017-05-01 | Discharge: 2017-05-01 | Disposition: A | Discharge status: Home / Self Care | Payer: 59 | Source: Ambulatory Visit | Attending: Nurse Practitioner | Admitting: Nurse Practitioner

## 2017-05-01 DIAGNOSIS — R599 Enlarged lymph nodes, unspecified: Secondary | ICD-10-CM

## 2017-05-02 ENCOUNTER — Other Ambulatory Visit: Payer: 59

## 2017-05-02 ENCOUNTER — Ambulatory Visit: Payer: 59 | Admitting: Nurse Practitioner

## 2017-05-05 ENCOUNTER — Telehealth: Payer: Self-pay | Admitting: Nurse Practitioner

## 2017-05-05 ENCOUNTER — Telehealth: Payer: Self-pay | Admitting: Hematology

## 2017-05-05 ENCOUNTER — Other Ambulatory Visit: Payer: Self-pay | Admitting: Family Medicine

## 2017-05-05 DIAGNOSIS — C799 Secondary malignant neoplasm of unspecified site: Secondary | ICD-10-CM

## 2017-05-05 NOTE — Telephone Encounter (Signed)
Called [facility] they called and informed patient of appointment date/time/location/phone number

## 2017-05-05 NOTE — Telephone Encounter (Signed)
It appears patient has been notified of results and follow up

## 2017-05-05 NOTE — Telephone Encounter (Signed)
[name] called from the [facility] for pathology results. Zelda wasn't available due to being off Dr. Margarita Rana will call her back within the hour to speak about results immediately.

## 2017-05-05 NOTE — Progress Notes (Signed)
The breast center called with lymph node pathology revealing metastatic carcinoma.  Please process this urgent referral to the cancer center.  Thank you

## 2017-05-09 ENCOUNTER — Telehealth (INDEPENDENT_AMBULATORY_CARE_PROVIDER_SITE_OTHER): Payer: Self-pay | Admitting: Nurse Practitioner

## 2017-05-09 ENCOUNTER — Other Ambulatory Visit: Payer: Self-pay | Admitting: Nurse Practitioner

## 2017-05-09 DIAGNOSIS — R928 Other abnormal and inconclusive findings on diagnostic imaging of breast: Secondary | ICD-10-CM

## 2017-05-09 NOTE — Progress Notes (Signed)
Patient has an appointment with [facility] on 05-15-17 @2PM 

## 2017-05-09 NOTE — Telephone Encounter (Signed)
The [facility] cancer Center ([place]) asking to please change the order from Left breast to a Bilateral, please follow up

## 2017-05-09 NOTE — Telephone Encounter (Signed)
Order has been signed.

## 2017-05-10 ENCOUNTER — Ambulatory Visit
Admission: RE | Admit: 2017-05-10 | Discharge: 2017-05-10 | Disposition: A | Discharge status: Home / Self Care | Payer: 59 | Source: Ambulatory Visit | Attending: Nurse Practitioner | Admitting: Nurse Practitioner

## 2017-05-10 ENCOUNTER — Other Ambulatory Visit: Payer: Self-pay | Admitting: Hematology

## 2017-05-10 ENCOUNTER — Other Ambulatory Visit: Payer: Self-pay | Admitting: Nurse Practitioner

## 2017-05-10 DIAGNOSIS — R928 Other abnormal and inconclusive findings on diagnostic imaging of breast: Secondary | ICD-10-CM

## 2017-05-10 DIAGNOSIS — R9389 Abnormal findings on diagnostic imaging of other specified body structures: Secondary | ICD-10-CM

## 2017-05-10 MED ORDER — GADOBENATE DIMEGLUMINE 529 MG/ML IV SOLN
13.0000 mL | Freq: Once | INTRAVENOUS | Status: AC | PRN
  Administered 2017-05-10: 13 mL via INTRAVENOUS

## 2017-05-15 ENCOUNTER — Ambulatory Visit: Payer: 59 | Admitting: Nurse Practitioner

## 2017-05-17 ENCOUNTER — Ambulatory Visit
Admission: RE | Admit: 2017-05-17 | Discharge: 2017-05-17 | Disposition: A | Discharge status: Home / Self Care | Payer: 59 | Source: Ambulatory Visit | Attending: Nurse Practitioner | Admitting: Nurse Practitioner

## 2017-05-17 DIAGNOSIS — R9389 Abnormal findings on diagnostic imaging of other specified body structures: Secondary | ICD-10-CM

## 2017-05-17 MED ORDER — GADOBENATE DIMEGLUMINE 529 MG/ML IV SOLN
13.0000 mL | Freq: Once | INTRAVENOUS | Status: AC | PRN
  Administered 2017-05-17: 13 mL via INTRAVENOUS
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
## 2017-05-22 ENCOUNTER — Inpatient Hospital Stay: Payer: 59 | Attending: Nurse Practitioner | Admitting: Hematology

## 2017-05-22 ENCOUNTER — Encounter: Payer: Self-pay | Admitting: Hematology

## 2017-05-22 ENCOUNTER — Telehealth: Payer: Self-pay

## 2017-05-22 DIAGNOSIS — I10 Essential (primary) hypertension: Secondary | ICD-10-CM | POA: Diagnosis not present

## 2017-05-22 DIAGNOSIS — C50212 Malignant neoplasm of upper-inner quadrant of left female breast: Secondary | ICD-10-CM | POA: Diagnosis not present

## 2017-05-22 DIAGNOSIS — Z170 Estrogen receptor positive status [ER+]: Secondary | ICD-10-CM | POA: Insufficient documentation

## 2017-05-22 DIAGNOSIS — E119 Type 2 diabetes mellitus without complications: Secondary | ICD-10-CM | POA: Diagnosis not present

## 2017-05-22 DIAGNOSIS — C773 Secondary and unspecified malignant neoplasm of axilla and upper limb lymph nodes: Secondary | ICD-10-CM | POA: Diagnosis not present

## 2017-05-22 NOTE — Telephone Encounter (Signed)
Printed avs and calender of upcoming appointment. Per 4/29 los no active request for bone scan

## 2017-05-23 ENCOUNTER — Encounter: Payer: Self-pay | Admitting: Hematology

## 2017-05-29 ENCOUNTER — Other Ambulatory Visit: Payer: Self-pay | Admitting: General Surgery

## 2017-05-29 ENCOUNTER — Other Ambulatory Visit: Payer: Self-pay | Admitting: Hematology

## 2017-05-29 DIAGNOSIS — C50212 Malignant neoplasm of upper-inner quadrant of left female breast: Secondary | ICD-10-CM

## 2017-05-29 DIAGNOSIS — Z170 Estrogen receptor positive status [ER+]: Secondary | ICD-10-CM

## 2017-05-30 ENCOUNTER — Encounter (HOSPITAL_BASED_OUTPATIENT_CLINIC_OR_DEPARTMENT_OTHER): Payer: Self-pay | Admitting: *Deleted

## 2017-05-30 ENCOUNTER — Other Ambulatory Visit: Payer: Self-pay
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
## 2017-05-30 NOTE — Pre-Procedure Instructions (Signed)
Addendum to interpreter...will be someone from [employer] coming, she did not give a name.

## 2017-05-30 NOTE — Pre-Procedure Instructions (Signed)
Late add-on, requested Spanish interpreter with Bethena Roys at [facility] at 1600. She called back at 1625, no interpreters available. I called Olivia Mackie at office of [employer] and req interpreter, she will call back.
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
## 2017-05-31 ENCOUNTER — Ambulatory Visit (HOSPITAL_COMMUNITY): Payer: 59

## 2017-05-31 ENCOUNTER — Ambulatory Visit (HOSPITAL_BASED_OUTPATIENT_CLINIC_OR_DEPARTMENT_OTHER): Payer: 59 | Admitting: Anesthesiology

## 2017-05-31 ENCOUNTER — Other Ambulatory Visit: Payer: Self-pay

## 2017-05-31 ENCOUNTER — Encounter (HOSPITAL_BASED_OUTPATIENT_CLINIC_OR_DEPARTMENT_OTHER)
Admission: RE | Disposition: A | Discharge status: Home / Self Care | Payer: Self-pay | Source: Ambulatory Visit | Attending: General Surgery

## 2017-05-31 ENCOUNTER — Encounter (HOSPITAL_BASED_OUTPATIENT_CLINIC_OR_DEPARTMENT_OTHER): Payer: Self-pay | Admitting: Anesthesiology

## 2017-05-31 ENCOUNTER — Ambulatory Visit (HOSPITAL_BASED_OUTPATIENT_CLINIC_OR_DEPARTMENT_OTHER)
Admission: RE | Admit: 2017-05-31 | Discharge: 2017-05-31 | Disposition: A | Discharge status: Home / Self Care | Payer: 59 | Source: Ambulatory Visit | Attending: General Surgery | Admitting: General Surgery

## 2017-05-31 DIAGNOSIS — Z419 Encounter for procedure for purposes other than remedying health state, unspecified: Secondary | ICD-10-CM

## 2017-05-31 DIAGNOSIS — Z170 Estrogen receptor positive status [ER+]: Secondary | ICD-10-CM

## 2017-05-31 DIAGNOSIS — Z7984 Long term (current) use of oral hypoglycemic drugs: Secondary | ICD-10-CM | POA: Insufficient documentation

## 2017-05-31 DIAGNOSIS — I10 Essential (primary) hypertension: Secondary | ICD-10-CM | POA: Insufficient documentation

## 2017-05-31 DIAGNOSIS — Z95828 Presence of other vascular implants and grafts: Secondary | ICD-10-CM

## 2017-05-31 DIAGNOSIS — E119 Type 2 diabetes mellitus without complications: Secondary | ICD-10-CM | POA: Insufficient documentation

## 2017-05-31 DIAGNOSIS — Z79899 Other long term (current) drug therapy: Secondary | ICD-10-CM | POA: Insufficient documentation

## 2017-05-31 DIAGNOSIS — C50212 Malignant neoplasm of upper-inner quadrant of left female breast: Secondary | ICD-10-CM

## 2017-05-31 DIAGNOSIS — C50912 Malignant neoplasm of unspecified site of left female breast: Secondary | ICD-10-CM | POA: Insufficient documentation

## 2017-05-31 HISTORY — DX: Displaced bicondylar fracture of left tibia, initial encounter for closed fracture: S82.142A

## 2017-05-31 HISTORY — DX: Malignant (primary) neoplasm, unspecified: C80.1

## 2017-05-31 HISTORY — PX: PORTACATH PLACEMENT: SHX2246

## 2017-05-31 LAB — POCT I-STAT, CHEM 8
BUN: 12 mg/dL (ref 6–20)
CALCIUM ION: 1.2 mmol/L (ref 1.15–1.40)
Chloride: 97 mmol/L — ABNORMAL LOW (ref 101–111)
Creatinine, Ser: 0.5 mg/dL (ref 0.44–1.00)
GLUCOSE: 214 mg/dL — AB (ref 65–99)
HCT: 45 % (ref 36.0–46.0)
Hemoglobin: 15.3 g/dL — ABNORMAL HIGH (ref 12.0–15.0)
Potassium: 3.9 mmol/L (ref 3.5–5.1)
SODIUM: 138 mmol/L (ref 135–145)
TCO2: 30 mmol/L (ref 22–32)

## 2017-05-31 LAB — GLUCOSE, CAPILLARY: Glucose-Capillary: 183 mg/dL — ABNORMAL HIGH (ref 65–99)

## 2017-05-31 SURGERY — INSERTION, TUNNELED CENTRAL VENOUS DEVICE, WITH PORT
Anesthesia: General | Site: Chest | Laterality: Right

## 2017-05-31 MED ORDER — PROPOFOL 10 MG/ML IV BOLUS
INTRAVENOUS | Status: DC | PRN
  Administered 2017-05-31: 120 mg via INTRAVENOUS

## 2017-05-31 MED ORDER — ACETAMINOPHEN 500 MG PO TABS
ORAL_TABLET | ORAL | Status: AC
  Filled 2017-05-31: qty 2

## 2017-05-31 MED ORDER — LACTATED RINGERS IV SOLN
INTRAVENOUS | Status: DC
  Administered 2017-05-31 (×2): via INTRAVENOUS

## 2017-05-31 MED ORDER — HEPARIN (PORCINE) IN NACL 1000-0.9 UT/500ML-% IV SOLN
INTRAVENOUS | Status: AC
  Filled 2017-05-31: qty 500

## 2017-05-31 MED ORDER — FENTANYL CITRATE (PF) 100 MCG/2ML IJ SOLN
INTRAMUSCULAR | Status: DC | PRN
  Administered 2017-05-31: 100 ug via INTRAVENOUS

## 2017-05-31 MED ORDER — CELECOXIB 200 MG PO CAPS
ORAL_CAPSULE | ORAL | Status: AC
  Filled 2017-05-31: qty 1

## 2017-05-31 MED ORDER — DEXAMETHASONE SODIUM PHOSPHATE 10 MG/ML IJ SOLN
INTRAMUSCULAR | Status: DC | PRN
  Administered 2017-05-31: 10 mg via INTRAVENOUS

## 2017-05-31 MED ORDER — ONDANSETRON HCL 4 MG/2ML IJ SOLN
INTRAMUSCULAR | Status: AC
  Filled 2017-05-31: qty 2

## 2017-05-31 MED ORDER — EPHEDRINE SULFATE 50 MG/ML IJ SOLN
INTRAMUSCULAR | Status: AC
  Filled 2017-05-31: qty 1

## 2017-05-31 MED ORDER — CEFAZOLIN SODIUM-DEXTROSE 2-4 GM/100ML-% IV SOLN
2.0000 g | INTRAVENOUS | Status: AC
  Administered 2017-05-31: 2 g via INTRAVENOUS

## 2017-05-31 MED ORDER — FENTANYL CITRATE (PF) 100 MCG/2ML IJ SOLN: 50.0000 ug | INTRAMUSCULAR | Status: DC | PRN

## 2017-05-31 MED ORDER — ACETAMINOPHEN 325 MG PO TABS: 325.0000 mg | ORAL_TABLET | ORAL | Status: DC | PRN

## 2017-05-31 MED ORDER — MIDAZOLAM HCL 2 MG/2ML IJ SOLN: 1.0000 mg | INTRAMUSCULAR | Status: DC | PRN

## 2017-05-31 MED ORDER — MEPERIDINE HCL 25 MG/ML IJ SOLN: 6.2500 mg | INTRAMUSCULAR | Status: DC | PRN

## 2017-05-31 MED ORDER — SCOPOLAMINE 1 MG/3DAYS TD PT72: 1.0000 | MEDICATED_PATCH | Freq: Once | TRANSDERMAL | Status: DC | PRN

## 2017-05-31 MED ORDER — ONDANSETRON HCL 4 MG/2ML IJ SOLN: 4.0000 mg | Freq: Once | INTRAMUSCULAR | Status: DC | PRN

## 2017-05-31 MED ORDER — CHLORHEXIDINE GLUCONATE CLOTH 2 % EX PADS: 6.0000 | MEDICATED_PAD | Freq: Once | CUTANEOUS | Status: DC

## 2017-05-31 MED ORDER — ACETAMINOPHEN 160 MG/5ML PO SOLN: 325.0000 mg | ORAL | Status: DC | PRN

## 2017-05-31 MED ORDER — PROPOFOL 10 MG/ML IV BOLUS
INTRAVENOUS | Status: AC
  Filled 2017-05-31: qty 20

## 2017-05-31 MED ORDER — FENTANYL CITRATE (PF) 100 MCG/2ML IJ SOLN
INTRAMUSCULAR | Status: AC
Start: 2017-05-31 — End: ?
  Filled 2017-05-31: qty 2

## 2017-05-31 MED ORDER — CELECOXIB 200 MG PO CAPS
200.0000 mg | ORAL_CAPSULE | ORAL | Status: AC
  Administered 2017-05-31: 200 mg via ORAL

## 2017-05-31 MED ORDER — GABAPENTIN 300 MG PO CAPS
300.0000 mg | ORAL_CAPSULE | ORAL | Status: AC
  Administered 2017-05-31: 300 mg via ORAL

## 2017-05-31 MED ORDER — FENTANYL CITRATE (PF) 100 MCG/2ML IJ SOLN: 25.0000 ug | INTRAMUSCULAR | Status: DC | PRN

## 2017-05-31 MED ORDER — MIDAZOLAM HCL 2 MG/2ML IJ SOLN
INTRAMUSCULAR | Status: AC
  Filled 2017-05-31: qty 2

## 2017-05-31 MED ORDER — PHENYLEPHRINE 40 MCG/ML (10ML) SYRINGE FOR IV PUSH (FOR BLOOD PRESSURE SUPPORT)
PREFILLED_SYRINGE | INTRAVENOUS | Status: AC
  Filled 2017-05-31: qty 10

## 2017-05-31 MED ORDER — DEXAMETHASONE SODIUM PHOSPHATE 10 MG/ML IJ SOLN
INTRAMUSCULAR | Status: AC
  Filled 2017-05-31: qty 1

## 2017-05-31 MED ORDER — HEPARIN (PORCINE) IN NACL 2-0.9 UNITS/ML
INTRAMUSCULAR | Status: AC | PRN
  Administered 2017-05-31: 1 via INTRAVENOUS

## 2017-05-31 MED ORDER — ONDANSETRON HCL 4 MG/2ML IJ SOLN
INTRAMUSCULAR | Status: DC | PRN
  Administered 2017-05-31: 4 mg via INTRAVENOUS

## 2017-05-31 MED ORDER — OXYCODONE HCL 5 MG PO TABS: 5.0000 mg | ORAL_TABLET | Freq: Once | ORAL | Status: DC | PRN

## 2017-05-31 MED ORDER — HEPARIN SOD (PORK) LOCK FLUSH 100 UNIT/ML IV SOLN
INTRAVENOUS | Status: AC
  Filled 2017-05-31: qty 5

## 2017-05-31 MED ORDER — OXYCODONE HCL 5 MG/5ML PO SOLN: 5.0000 mg | Freq: Once | ORAL | Status: DC | PRN

## 2017-05-31 MED ORDER — HEPARIN SOD (PORK) LOCK FLUSH 100 UNIT/ML IV SOLN
INTRAVENOUS | Status: DC | PRN
  Administered 2017-05-31: 500 [IU] via INTRAVENOUS

## 2017-05-31 MED ORDER — MIDAZOLAM HCL 5 MG/5ML IJ SOLN
INTRAMUSCULAR | Status: DC | PRN
  Administered 2017-05-31: 2 mg via INTRAVENOUS

## 2017-05-31 MED ORDER — ACETAMINOPHEN 500 MG PO TABS
1000.0000 mg | ORAL_TABLET | ORAL | Status: AC
  Administered 2017-05-31: 1000 mg via ORAL

## 2017-05-31 MED ORDER — EPHEDRINE SULFATE 50 MG/ML IJ SOLN
INTRAMUSCULAR | Status: DC | PRN
  Administered 2017-05-31 (×2): 5 mg via INTRAVENOUS

## 2017-05-31 MED ORDER — BUPIVACAINE-EPINEPHRINE (PF) 0.5% -1:200000 IJ SOLN
INTRAMUSCULAR | Status: DC | PRN
  Administered 2017-05-31: 9 mL

## 2017-05-31 MED ORDER — HYDROCODONE-ACETAMINOPHEN 5-325 MG PO TABS: 1.0000 | ORAL_TABLET | Freq: Four times a day (QID) | ORAL | 0 refills | Status: DC | PRN

## 2017-05-31 MED ORDER — CEFAZOLIN SODIUM-DEXTROSE 2-4 GM/100ML-% IV SOLN
INTRAVENOUS | Status: AC
  Filled 2017-05-31: qty 100

## 2017-05-31 MED ORDER — GABAPENTIN 300 MG PO CAPS
ORAL_CAPSULE | ORAL | Status: AC
  Filled 2017-05-31: qty 1

## 2017-05-31 MED ORDER — PHENYLEPHRINE HCL 10 MG/ML IJ SOLN
INTRAMUSCULAR | Status: DC | PRN
  Administered 2017-05-31: 40 ug via INTRAVENOUS
  Administered 2017-05-31: 80 ug via INTRAVENOUS
  Administered 2017-05-31 (×2): 40 ug via INTRAVENOUS

## 2017-05-31 SURGICAL SUPPLY — 53 items
APL SKNCLS STERI-STRIP NONHPOA (GAUZE/BANDAGES/DRESSINGS)
BAG DECANTER FOR FLEXI CONT (MISCELLANEOUS) ×2 IMPLANT
BENZOIN TINCTURE PRP APPL 2/3 (GAUZE/BANDAGES/DRESSINGS) IMPLANT
BLADE HEX COATED 2.75 (ELECTRODE) ×2 IMPLANT
BLADE SURG 15 STRL LF DISP TIS (BLADE) ×1 IMPLANT
BLADE SURG 15 STRL SS (BLADE) ×2
CANISTER SUCT 1200ML W/VALVE (MISCELLANEOUS) IMPLANT
CHLORAPREP W/TINT 26ML (MISCELLANEOUS) ×2 IMPLANT
COVER BACK TABLE 60X90IN (DRAPES) ×2 IMPLANT
COVER MAYO STAND STRL (DRAPES) ×2 IMPLANT
COVER PROBE 5X48 (MISCELLANEOUS) ×2
DECANTER SPIKE VIAL GLASS SM (MISCELLANEOUS) IMPLANT
DERMABOND ADVANCED (GAUZE/BANDAGES/DRESSINGS) ×1
DERMABOND ADVANCED .7 DNX12 (GAUZE/BANDAGES/DRESSINGS) ×1 IMPLANT
DRAPE C-ARM 42X72 X-RAY (DRAPES) ×2 IMPLANT
DRAPE LAPAROSCOPIC ABDOMINAL (DRAPES) ×2 IMPLANT
DRAPE UTILITY XL STRL (DRAPES) ×2 IMPLANT
DRSG TEGADERM 2-3/8X2-3/4 SM (GAUZE/BANDAGES/DRESSINGS) IMPLANT
DRSG TEGADERM 4X10 (GAUZE/BANDAGES/DRESSINGS) IMPLANT
DRSG TEGADERM 4X4.75 (GAUZE/BANDAGES/DRESSINGS) IMPLANT
ELECT REM PT RETURN 9FT ADLT (ELECTROSURGICAL) ×2
ELECTRODE REM PT RTRN 9FT ADLT (ELECTROSURGICAL) ×1 IMPLANT
GAUZE SPONGE 4X4 12PLY STRL LF (GAUZE/BANDAGES/DRESSINGS) IMPLANT
GLOVE BIO SURGEON STRL SZ7 (GLOVE) ×4 IMPLANT
GLOVE BIOGEL PI IND STRL 6.5 (GLOVE) ×1 IMPLANT
GLOVE BIOGEL PI INDICATOR 6.5 (GLOVE) ×1
GLOVE EUDERMIC 7 POWDERFREE (GLOVE) ×2 IMPLANT
GOWN STRL REUS W/ TWL LRG LVL3 (GOWN DISPOSABLE) ×1 IMPLANT
GOWN STRL REUS W/ TWL XL LVL3 (GOWN DISPOSABLE) ×1 IMPLANT
GOWN STRL REUS W/TWL LRG LVL3 (GOWN DISPOSABLE) ×2
GOWN STRL REUS W/TWL XL LVL3 (GOWN DISPOSABLE) ×2
IV CATH PLACEMENT UNIT 16 GA (IV SOLUTION) IMPLANT
IV KIT MINILOC 20X1 SAFETY (NEEDLE) IMPLANT
KIT CVR 48X5XPRB PLUP LF (MISCELLANEOUS) ×1 IMPLANT
KIT PORT POWER 8FR ISP CVUE (Port) ×2 IMPLANT
NEEDLE BLUNT 17GA (NEEDLE) IMPLANT
NEEDLE HYPO 22GX1.5 SAFETY (NEEDLE) ×2 IMPLANT
NEEDLE HYPO 25X1 1.5 SAFETY (NEEDLE) ×2 IMPLANT
PACK BASIN DAY SURGERY FS (CUSTOM PROCEDURE TRAY) ×2 IMPLANT
PENCIL BUTTON HOLSTER BLD 10FT (ELECTRODE) ×2 IMPLANT
SET SHEATH INTRODUCER 10FR (MISCELLANEOUS) IMPLANT
SHEATH COOK PEEL AWAY SET 9F (SHEATH) IMPLANT
SLEEVE SCD COMPRESS KNEE MED (MISCELLANEOUS) ×2 IMPLANT
STRIP CLOSURE SKIN 1/2X4 (GAUZE/BANDAGES/DRESSINGS) IMPLANT
SUT MNCRL AB 4-0 PS2 18 (SUTURE) ×2 IMPLANT
SUT PROLENE 2 0 CT2 30 (SUTURE) ×2 IMPLANT
SUT VICRYL 3-0 CR8 SH (SUTURE) ×2 IMPLANT
SYR 10ML LL (SYRINGE) ×2 IMPLANT
SYR 5ML LUER SLIP (SYRINGE) ×2 IMPLANT
TOWEL OR 17X24 6PK STRL BLUE (TOWEL DISPOSABLE) ×4 IMPLANT
TOWEL OR NON WOVEN STRL DISP B (DISPOSABLE) ×2 IMPLANT
TUBE CONNECTING 20X1/4 (TUBING) IMPLANT
YANKAUER SUCT BULB TIP NO VENT (SUCTIONS) IMPLANT
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
## 2017-05-31 NOTE — Discharge Instructions (Signed)
PORT-A-CATH: POST OP INSTRUCTIONS  Always review your discharge instruction sheet given to you by the facility where your surgery was performed.   1. A prescription for pain medication may be given to you upon discharge. Take your pain medication as prescribed, if needed. If narcotic pain medicine is not needed, then you make take acetaminophen (Tylenol) or ibuprofen (Advil) as needed.  2. Take your usually prescribed medications unless otherwise directed. 3. If you need a refill on your pain medication, please contact our office. All narcotic pain medicine now requires a paper prescription.  Phoned in and fax refills are no longer allowed by law.  Prescriptions will not be filled after 5 pm or on weekends.  4. You should follow a light diet for the remainder of the day after your procedure. 5. Most patients will experience some mild swelling and/or bruising in the area of the incision. It may take several days to resolve. 6. It is common to experience some constipation if taking pain medication after surgery. Increasing fluid intake and taking a stool softener (such as Colace) will usually help or prevent this problem from occurring. A mild laxative (Milk of Magnesia or Miralax) should be taken according to package directions if there are no bowel movements after 48 hours.  7. Unless discharge instructions indicate otherwise, you may remove your bandages 48 hours after surgery, and you may shower at that time. You may have steri-strips (small white skin tapes) in place directly over the incision.  These strips should be left on the skin for 7-10 days.  If your surgeon used Dermabond (skin glue) on the incision, you may shower in 24 hours.  The glue will flake off over the next 2-3 weeks.  8. If your port is left accessed at the end of surgery (needle left in port), the dressing cannot get wet and should only by changed by a healthcare professional. When the port is no longer accessed (when the  needle has been removed), follow step 7.   9. ACTIVITIES:  Limit activity involving your arms for the next 72 hours. Do no strenuous exercise or activity for 1 week. You may drive when you are no longer taking prescription pain medication, you can comfortably wear a seatbelt, and you can maneuver your car. 10.You may need to see your doctor in the office for a follow-up appointment.  Please       check with your doctor.  11.When you receive a new Port-a-Cath, you will get a product guide and        ID card.  Please keep them in case you need them.  WHEN TO CALL YOUR DOCTOR [phone]): 1. Fever over 101.0 2. Chills 3. Continued bleeding from incision 4. Increased redness and tenderness at the site 5. Shortness of breath, difficulty breathing   The clinic staff is available to answer your questions during regular business hours. Please dont hesitate to call and ask to speak to one of the nurses or medical assistants for clinical concerns. If you have a medical emergency, go to the nearest emergency room or call 911.  A surgeon from [facility] is always on call at the hospital.     For further information, please visit [email or web address]    Post Anesthesia Home Care Instructions  Activity: Get plenty of rest for the remainder of the day. A responsible individual must stay with you for 24 hours following the procedure.  For the next 24 hours, DO NOT: -Drive a  DO NOT: -Drive a car -Operate machinery -Drink alcoholic beverages -Take any medication unless instructed by your physician -Make any legal decisions or sign important papers.  Meals: Start with liquid foods such as gelatin or soup. Progress to regular foods as tolerated. Avoid greasy, spicy, heavy foods. If nausea and/or vomiting occur, drink only clear liquids until the nausea and/or vomiting subsides. Call your physician if vomiting continues.  Special Instructions/Symptoms: Your throat may feel dry or  sore from the anesthesia or the breathing tube placed in your throat during surgery. If this causes discomfort, gargle with warm salt water. The discomfort should disappear within 24 hours.  If you had a scopolamine patch placed behind your ear for the management of post- operative nausea and/or vomiting:  1. The medication in the patch is effective for 72 hours, after which it should be removed.  Wrap patch in a tissue and discard in the trash. Wash hands thoroughly with soap and water. 2. You may remove the patch earlier than 72 hours if you experience unpleasant side effects which may include dry mouth, dizziness or visual disturbances. 3. Avoid touching the patch. Wash your hands with soap and water after contact with the patch.      Call your surgeon if you experience:   1.  Fever over 101.0. 2.  Inability to urinate. 3.  Nausea and/or vomiting. 4.  Extreme swelling or bruising at the surgical site. 5.  Continued bleeding from the incision. 6.  Increased pain, redness or drainage from the incision. 7.  Problems related to your pain medication. 8.  Any problems and/or concerns  

## 2017-05-31 NOTE — Interval H&P Note (Signed)
History and Physical Interval Note:  05/31/2017 10:20 AM  Amanda Duarte  has presented today for surgery, with the diagnosis of Breast cancer  The various methods of treatment have been discussed with the patient and family. After consideration of risks, benefits and other options for treatment, the patient has consented to  Procedure(s): INSERTION PORT-A-CATH WITH ULTRASOUND (N/A) as a surgical intervention .  The patient's history has been reviewed, patient examined, no change in status, stable for surgery.  I have reviewed the patient's chart and labs.  Questions were answered to the patient's satisfaction.     [name]

## 2017-05-31 NOTE — Anesthesia Procedure Notes (Signed)
Procedure Name: LMA Insertion Date/Time: 05/31/2017 11:25 AM Performed by: Alvy Bimler, CRNA Pre-anesthesia Checklist: Patient identified, Patient being monitored, Emergency Drugs available, Timeout performed and Suction available Patient Re-evaluated:Patient Re-evaluated prior to induction Oxygen Delivery Method: Circle System Utilized Preoxygenation: Pre-oxygenation with 100% oxygen Induction Type: IV induction Ventilation: Mask ventilation without difficulty LMA: LMA inserted LMA Size: 4.0 Number of attempts: 1 Placement Confirmation: positive ETCO2 and breath sounds checked- equal and bilateral

## 2017-05-31 NOTE — Anesthesia Procedure Notes (Signed)
Performed by: Alvy Bimler, CRNA

## 2017-05-31 NOTE — Transfer of Care (Signed)
Immediate Anesthesia Transfer of Care Note  Patient: Amanda Duarte  Procedure(s) Performed: INSERTION PORT-A-CATH (Right Chest)  Patient Location: PACU  Anesthesia Type:General  Level of Consciousness: awake, alert , oriented and patient cooperative  Airway & Oxygen Therapy: Patient Spontanous Breathing and Patient connected to face mask oxygen  Post-op Assessment: Report given to RN, Post -op Vital signs reviewed and stable and Patient moving all extremities X 4  Post vital signs: Reviewed and stable  Last Vitals:  Vitals Value Taken Time  BP    Temp    Pulse 75 05/31/2017 12:14 PM  Resp    SpO2 100 % 05/31/2017 12:14 PM  Vitals shown include unvalidated device data.  Last Pain:  Vitals:   05/31/17 1049  TempSrc: Oral  PainSc: 0-No pain      Patients Stated Pain Goal: 3 (63/01/60 1093)  Complications: No apparent anesthesia complications

## 2017-05-31 NOTE — Anesthesia Preprocedure Evaluation (Signed)
Anesthesia Evaluation    Airway Mallampati: II       Dental  (+) Caps,    Pulmonary neg pulmonary ROS,    Pulmonary exam normal        Cardiovascular hypertension, Pt. on medications Normal cardiovascular exam Rhythm:Regular Rate:Normal     Neuro/Psych negative neurological ROS  negative psych ROS   GI/Hepatic negative GI ROS, Neg liver ROS,   Endo/Other  diabetes, Oral Hypoglycemic Agents  Renal/GU negative Renal ROS     Musculoskeletal   Abdominal Normal abdominal exam  (+)   Peds  Hematology  (+) Blood dyscrasia, anemia ,   Anesthesia Other Findings   Reproductive/Obstetrics                             Anesthesia Physical  Anesthesia Plan  ASA: II  Anesthesia Plan: General   Post-op Pain Management:    Induction: Intravenous  PONV Risk Score and Plan: 3 and Ondansetron, Dexamethasone, Treatment may vary due to age or medical condition and Propofol infusion  Airway Management Planned: LMA  Additional Equipment:   Intra-op Plan:   Post-operative Plan: Extubation in OR  Informed Consent: I have reviewed the patients History and Physical, chart, labs and discussed the procedure including the risks, benefits and alternatives for the proposed anesthesia with the patient or authorized representative who has indicated his/her understanding and acceptance.   Dental advisory given  Plan Discussed with: CRNA, Anesthesiologist and Surgeon  Anesthesia Plan Comments: ( )        Anesthesia Quick Evaluation

## 2017-05-31 NOTE — Anesthesia Postprocedure Evaluation (Signed)
Anesthesia Post Note  Patient: Amanda Duarte  Procedure(s) Performed: INSERTION PORT-A-CATH (Right Chest)     Patient location during evaluation: PACU Anesthesia Type: General Level of consciousness: awake and alert Pain management: pain level controlled Vital Signs Assessment: post-procedure vital signs reviewed and stable Respiratory status: spontaneous breathing, nonlabored ventilation, respiratory function stable and patient connected to nasal cannula oxygen Cardiovascular status: blood pressure returned to baseline and stable Postop Assessment: no apparent nausea or vomiting Anesthetic complications: no    Last Vitals:  Vitals:   05/31/17 1253 05/31/17 1300  BP:  135/69  Pulse: 73 74  Resp: 10 16  Temp:  37.3 C  SpO2: 97% 99%    Last Pain:  Vitals:   05/31/17 1300  TempSrc:   PainSc: 0-No pain                 Vennie Waymire

## 2017-06-01 ENCOUNTER — Encounter (HOSPITAL_COMMUNITY)
Admission: RE | Admit: 2017-06-01 | Discharge: 2017-06-01 | Disposition: A | Discharge status: Home / Self Care | Payer: 59 | Source: Ambulatory Visit | Attending: Hematology | Admitting: Hematology

## 2017-06-01 ENCOUNTER — Ambulatory Visit (HOSPITAL_BASED_OUTPATIENT_CLINIC_OR_DEPARTMENT_OTHER)
Admission: RE | Admit: 2017-06-01 | Discharge: 2017-06-01 | Disposition: A | Discharge status: Home / Self Care | Payer: 59 | Source: Ambulatory Visit | Attending: Hematology | Admitting: Hematology

## 2017-06-01 ENCOUNTER — Encounter (HOSPITAL_BASED_OUTPATIENT_CLINIC_OR_DEPARTMENT_OTHER): Payer: Self-pay | Admitting: General Surgery

## 2017-06-01 ENCOUNTER — Other Ambulatory Visit: Payer: Self-pay | Admitting: Hematology

## 2017-06-01 ENCOUNTER — Ambulatory Visit (HOSPITAL_COMMUNITY)
Admission: RE | Admit: 2017-06-01 | Discharge: 2017-06-01 | Disposition: A | Discharge status: Home / Self Care | Payer: 59 | Source: Ambulatory Visit | Attending: Hematology | Admitting: Hematology

## 2017-06-01 DIAGNOSIS — C50212 Malignant neoplasm of upper-inner quadrant of left female breast: Secondary | ICD-10-CM

## 2017-06-01 DIAGNOSIS — I10 Essential (primary) hypertension: Secondary | ICD-10-CM | POA: Insufficient documentation

## 2017-06-01 DIAGNOSIS — Z170 Estrogen receptor positive status [ER+]: Secondary | ICD-10-CM

## 2017-06-01 DIAGNOSIS — E119 Type 2 diabetes mellitus without complications: Secondary | ICD-10-CM | POA: Insufficient documentation

## 2017-06-01 DIAGNOSIS — Z9889 Other specified postprocedural states: Secondary | ICD-10-CM | POA: Insufficient documentation

## 2017-06-01 DIAGNOSIS — E785 Hyperlipidemia, unspecified: Secondary | ICD-10-CM | POA: Insufficient documentation

## 2017-06-01 MED ORDER — TECHNETIUM TC 99M MEDRONATE IV KIT
21.8000 | PACK | Freq: Once | INTRAVENOUS | Status: AC | PRN
  Administered 2017-06-01: 21.8 via INTRAVENOUS

## 2017-06-01 NOTE — Progress Notes (Signed)
  Echocardiogram 2D Echocardiogram has been performed.  Merrie Roof F 06/01/2017, 11:21 AM

## 2017-06-01 NOTE — Progress Notes (Signed)
START ON PATHWAY REGIMEN - Breast   Dose-Dense AC q14 days:   A cycle is every 14 days:     Doxorubicin      Cyclophosphamide      Pegfilgrastim-xxxx   **Always confirm dose/schedule in your pharmacy ordering system**    Paclitaxel 80 mg/m2 Weekly:   Administer weekly:     Paclitaxel   **Always confirm dose/schedule in your pharmacy ordering system**    Patient Characteristics: Preoperative or Nonsurgical Candidate (Clinical Staging), Neoadjuvant Therapy followed by Surgery, Invasive Disease, Chemotherapy, HER2 Negative/Unknown/Equivocal, ER Positive Therapeutic Status: Preoperative or Nonsurgical Candidate (Clinical Staging) AJCC M Category: cM0 AJCC Grade: G3 Breast Surgical Plan: Neoadjuvant Therapy followed by Surgery ER Status: Positive (+) AJCC 8 Stage Grouping: Unknown HER2 Status: Negative (-) AJCC T Category: cTX AJCC N Category: cN2a PR Status: Negative (-) Intent of Therapy: Curative Intent, Discussed with Patient

## 2017-06-02 ENCOUNTER — Telehealth: Payer: Self-pay

## 2017-06-02 ENCOUNTER — Encounter (HOSPITAL_COMMUNITY): Payer: 59

## 2017-06-02 ENCOUNTER — Inpatient Hospital Stay: Payer: 59

## 2017-06-02 ENCOUNTER — Encounter: Payer: Self-pay | Admitting: Hematology

## 2017-06-02 ENCOUNTER — Inpatient Hospital Stay: Payer: 59 | Attending: Nurse Practitioner | Admitting: Hematology

## 2017-06-02 VITALS — BP 129/61 | HR 64 | Temp 98.4°F | Resp 18 | Ht 59.0 in | Wt 134.8 lb

## 2017-06-02 DIAGNOSIS — E119 Type 2 diabetes mellitus without complications: Secondary | ICD-10-CM

## 2017-06-02 DIAGNOSIS — C773 Secondary and unspecified malignant neoplasm of axilla and upper limb lymph nodes: Secondary | ICD-10-CM | POA: Diagnosis not present

## 2017-06-02 DIAGNOSIS — Z171 Estrogen receptor negative status [ER-]: Secondary | ICD-10-CM | POA: Insufficient documentation

## 2017-06-02 DIAGNOSIS — Z170 Estrogen receptor positive status [ER+]: Secondary | ICD-10-CM

## 2017-06-02 DIAGNOSIS — I10 Essential (primary) hypertension: Secondary | ICD-10-CM

## 2017-06-02 DIAGNOSIS — Z5189 Encounter for other specified aftercare: Secondary | ICD-10-CM | POA: Insufficient documentation

## 2017-06-02 DIAGNOSIS — C50212 Malignant neoplasm of upper-inner quadrant of left female breast: Secondary | ICD-10-CM

## 2017-06-02 NOTE — Telephone Encounter (Signed)
Called and spoke with daughter concerning changes that was made to her mother upcoming appointments. Due to RN meeting that was scheduled on 2 of her appointment days.

## 2017-06-02 NOTE — Telephone Encounter (Signed)
Printed avs and calender of upcoming appointment. Per 5/10 los. RN's  are in a meeting on this morning and had to schedule based on this

## 2017-06-06 ENCOUNTER — Encounter: Payer: Self-pay | Admitting: Physical Therapy

## 2017-06-06 ENCOUNTER — Telehealth: Payer: Self-pay

## 2017-06-06 ENCOUNTER — Ambulatory Visit: Payer: 59 | Attending: Student | Admitting: Physical Therapy

## 2017-06-06 ENCOUNTER — Other Ambulatory Visit: Payer: Self-pay | Admitting: Hematology

## 2017-06-06 ENCOUNTER — Other Ambulatory Visit: Payer: Self-pay

## 2017-06-06 DIAGNOSIS — M25562 Pain in left knee: Secondary | ICD-10-CM | POA: Insufficient documentation

## 2017-06-06 DIAGNOSIS — Z170 Estrogen receptor positive status [ER+]: Secondary | ICD-10-CM

## 2017-06-06 DIAGNOSIS — M25662 Stiffness of left knee, not elsewhere classified: Secondary | ICD-10-CM

## 2017-06-06 DIAGNOSIS — R262 Difficulty in walking, not elsewhere classified: Secondary | ICD-10-CM | POA: Diagnosis present

## 2017-06-06 DIAGNOSIS — C50212 Malignant neoplasm of upper-inner quadrant of left female breast: Secondary | ICD-10-CM

## 2017-06-06 MED ORDER — PROCHLORPERAZINE MALEATE 10 MG PO TABS: 10.0000 mg | ORAL_TABLET | Freq: Four times a day (QID) | ORAL | 1 refills | Status: DC | PRN

## 2017-06-06 MED ORDER — LIDOCAINE-PRILOCAINE 2.5-2.5 % EX CREA: TOPICAL_CREAM | CUTANEOUS | 3 refills | Status: DC

## 2017-06-06 MED ORDER — ONDANSETRON HCL 8 MG PO TABS: 8.0000 mg | ORAL_TABLET | Freq: Two times a day (BID) | ORAL | 1 refills | Status: DC | PRN

## 2017-06-06 NOTE — Telephone Encounter (Signed)
Patient calls stating the [name] was going to call in some medications for her to take prior to chemotherapy.  She checked with her pharmacy and there are none.  Her [phone]

## 2017-06-06 NOTE — Telephone Encounter (Signed)
I just called in Emla cream, compazine and zofran to her pharmacy. Please let her know. Thanks   [name] MD
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
## 2017-06-07 ENCOUNTER — Encounter: Payer: Self-pay | Admitting: Hematology

## 2017-06-07 ENCOUNTER — Telehealth: Payer: Self-pay

## 2017-06-07 NOTE — Progress Notes (Signed)
Called patient's daughter listed on ROI whom speaks English to discuss available financial resources. Left my contact name and number for her to return my call.

## 2017-06-07 NOTE — Progress Notes (Signed)
Patient's daughter [name] returned my call. Introduced myself as Arboriculturist and explained the reason for my call. She was unsure if her mom has met her ded/OOp for insurance. Advised her if needed, I can apply on her behalf for assistance through [place] for her to receive [employer]den[employer] at no cost to her. She verbalized understanding.  Discussed the one-time $1000 Advertising account executive. Based on verbal income, patient qualifies. Belinda Fisher to bring in her mom's last paystub when she comes on 06/09/17 to apply for grant. She verbalized understanding. She has my contact name and number for any additional financial questions or concerns.

## 2017-06-07 NOTE — Telephone Encounter (Signed)
Patient's daughter returned phone call to [name] left concerning financial resources.

## 2017-06-07 NOTE — Telephone Encounter (Signed)
Called patient informed EMLA cream, compazine and zofran were called into her [place].  Patient verbalized an understanding.

## 2017-06-08 ENCOUNTER — Other Ambulatory Visit: Payer: Self-pay | Admitting: Hematology
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
## 2017-06-09 ENCOUNTER — Inpatient Hospital Stay: Payer: 59

## 2017-06-09 ENCOUNTER — Encounter: Payer: Self-pay | Admitting: Hematology

## 2017-06-09 ENCOUNTER — Inpatient Hospital Stay (HOSPITAL_BASED_OUTPATIENT_CLINIC_OR_DEPARTMENT_OTHER): Payer: 59 | Admitting: Hematology

## 2017-06-09 VITALS — BP 144/58 | HR 68 | Temp 98.7°F | Resp 17 | Ht 59.0 in | Wt 133.9 lb

## 2017-06-09 DIAGNOSIS — C773 Secondary and unspecified malignant neoplasm of axilla and upper limb lymph nodes: Secondary | ICD-10-CM

## 2017-06-09 DIAGNOSIS — Z170 Estrogen receptor positive status [ER+]: Secondary | ICD-10-CM

## 2017-06-09 DIAGNOSIS — E119 Type 2 diabetes mellitus without complications: Secondary | ICD-10-CM | POA: Diagnosis not present

## 2017-06-09 DIAGNOSIS — I10 Essential (primary) hypertension: Secondary | ICD-10-CM

## 2017-06-09 DIAGNOSIS — Z171 Estrogen receptor negative status [ER-]: Secondary | ICD-10-CM | POA: Diagnosis not present

## 2017-06-09 DIAGNOSIS — C50212 Malignant neoplasm of upper-inner quadrant of left female breast: Secondary | ICD-10-CM

## 2017-06-09 DIAGNOSIS — Z95828 Presence of other vascular implants and grafts: Secondary | ICD-10-CM | POA: Insufficient documentation

## 2017-06-09 DIAGNOSIS — E118 Type 2 diabetes mellitus with unspecified complications: Secondary | ICD-10-CM

## 2017-06-09 LAB — CBC WITH DIFFERENTIAL (CANCER CENTER ONLY)
Basophils Absolute: 0 10*3/uL (ref 0.0–0.1)
Basophils Relative: 0 %
EOS ABS: 1.4 10*3/uL — AB (ref 0.0–0.5)
Eosinophils Relative: 13 %
HCT: 40.5 % (ref 34.8–46.6)
HEMOGLOBIN: 13.5 g/dL (ref 11.6–15.9)
LYMPHS ABS: 3.9 10*3/uL — AB (ref 0.9–3.3)
LYMPHS PCT: 36 %
MCH: 31.3 pg (ref 25.1–34.0)
MCHC: 33.3 g/dL (ref 31.5–36.0)
MCV: 94 fL (ref 79.5–101.0)
Monocytes Absolute: 0.6 10*3/uL (ref 0.1–0.9)
Monocytes Relative: 6 %
NEUTROS ABS: 4.7 10*3/uL (ref 1.5–6.5)
NEUTROS PCT: 45 %
Platelet Count: 204 10*3/uL (ref 145–400)
RBC: 4.31 MIL/uL (ref 3.70–5.45)
RDW: 13.1 % (ref 11.2–14.5)
WBC: 10.6 10*3/uL — AB (ref 3.9–10.3)

## 2017-06-09 LAB — CMP (CANCER CENTER ONLY)
ALK PHOS: 129 U/L (ref 40–150)
ALT: 17 U/L (ref 0–55)
AST: 19 U/L (ref 5–34)
Albumin: 4 g/dL (ref 3.5–5.0)
Anion gap: 11 (ref 3–11)
BUN: 8 mg/dL (ref 7–26)
CALCIUM: 9.4 mg/dL (ref 8.4–10.4)
CO2: 26 mmol/L (ref 22–29)
CREATININE: 0.73 mg/dL (ref 0.60–1.10)
Chloride: 99 mmol/L (ref 98–109)
GFR, Estimated: >60 mL/min (ref >60)
GLUCOSE: 323 mg/dL — AB (ref 70–140)
Potassium: 3.6 mmol/L (ref 3.5–5.1)
SODIUM: 136 mmol/L (ref 136–145)
Total Bilirubin: 0.4 mg/dL (ref 0.2–1.2)
Total Protein: 8 g/dL (ref 6.4–8.3)

## 2017-06-09 MED ORDER — SODIUM CHLORIDE 0.9 % IV SOLN
Freq: Once | INTRAVENOUS | Status: AC
  Administered 2017-06-09: 09:00:00 via INTRAVENOUS

## 2017-06-09 MED ORDER — LORAZEPAM 0.5 MG PO TABS: 0.5000 mg | ORAL_TABLET | Freq: Four times a day (QID) | ORAL | 0 refills | Status: DC | PRN

## 2017-06-09 MED ORDER — PALONOSETRON HCL INJECTION 0.25 MG/5ML
0.2500 mg | Freq: Once | INTRAVENOUS | Status: AC
  Administered 2017-06-09: 0.25 mg via INTRAVENOUS

## 2017-06-09 MED ORDER — SODIUM CHLORIDE 0.9% FLUSH
10.0000 mL | INTRAVENOUS | Status: DC | PRN
  Administered 2017-06-09: 10 mL
  Filled 2017-06-09: qty 10

## 2017-06-09 MED ORDER — HEPARIN SOD (PORK) LOCK FLUSH 100 UNIT/ML IV SOLN
500.0000 [IU] | Freq: Once | INTRAVENOUS | Status: AC | PRN
  Administered 2017-06-09: 500 [IU]
  Filled 2017-06-09: qty 5

## 2017-06-09 MED ORDER — DOXORUBICIN HCL CHEMO IV INJECTION 2 MG/ML
60.0000 mg/m2 | Freq: Once | INTRAVENOUS | Status: AC
  Administered 2017-06-09: 94 mg via INTRAVENOUS
  Filled 2017-06-09: qty 47

## 2017-06-09 MED ORDER — PALONOSETRON HCL INJECTION 0.25 MG/5ML
INTRAVENOUS | Status: AC
  Filled 2017-06-09: qty 5

## 2017-06-09 MED ORDER — FOSAPREPITANT DIMEGLUMINE INJECTION 150 MG
Freq: Once | INTRAVENOUS | Status: AC
  Administered 2017-06-09: 10:00:00 via INTRAVENOUS
  Filled 2017-06-09: qty 5

## 2017-06-09 MED ORDER — SODIUM CHLORIDE 0.9 % IV SOLN
600.0000 mg/m2 | Freq: Once | INTRAVENOUS | Status: AC
  Administered 2017-06-09: 940 mg via INTRAVENOUS
  Filled 2017-06-09: qty 47

## 2017-06-09 MED ORDER — DEXAMETHASONE 4 MG PO TABS: ORAL_TABLET | ORAL | 1 refills | Status: DC

## 2017-06-09 NOTE — Patient Instructions (Signed)
[facility] Discharge Instructions for Patients Receiving Chemotherapy  Today you received the following chemotherapy agents: Adriamycin and Cytoxan.  To help prevent nausea and vomiting after your treatment, we encourage you to take your nausea medication Compazine as directed. May start Zofran on 3rd day after treatment,   If you develop nausea and vomiting that is not controlled by your nausea medication, call the clinic.   BELOW ARE SYMPTOMS THAT SHOULD BE REPORTED IMMEDIATELY:  *FEVER GREATER THAN 100.5 F  *CHILLS WITH OR WITHOUT FEVER  NAUSEA AND VOMITING THAT IS NOT CONTROLLED WITH YOUR NAUSEA MEDICATION  *UNUSUAL SHORTNESS OF BREATH  *UNUSUAL BRUISING OR BLEEDING  TENDERNESS IN MOUTH AND THROAT WITH OR WITHOUT PRESENCE OF ULCERS  *URINARY PROBLEMS  *BOWEL PROBLEMS  UNUSUAL RASH Items with * indicate a potential emergency and should be followed up as soon as possible.  Feel free to call the clinic should you have any questions or concerns. The clinic phone number is [phone].  Please show the [facility] at check-in to the Emergency Department and triage nurse.  Doxorubicin injection What is this medicine? DOXORUBICIN (dox oh ROO bi sin) is a chemotherapy drug. It is used to treat many kinds of cancer like leukemia, lymphoma, neuroblastoma, sarcoma, and Wilms' tumor. It is also used to treat bladder cancer, breast cancer, lung cancer, ovarian cancer, stomach cancer, and thyroid cancer. This medicine may be used for other purposes; ask your health care provider or pharmacist if you have questions. COMMON BRAND NAME(S): Adriamycin, Adriamycin PFS, Adriamycin RDF, Rubex What should I tell my health care provider before I take this medicine? They need to know if you have any of these conditions: -heart disease -history of low blood counts caused by a medicine -liver disease -recent or ongoing radiation therapy -an unusual or allergic reaction  to doxorubicin, other chemotherapy agents, other medicines, foods, dyes, or preservatives -pregnant or trying to get pregnant -breast-feeding How should I use this medicine? This drug is given as an infusion into a vein. It is administered in a hospital or clinic by a specially trained health care professional. If you have pain, swelling, burning or any unusual feeling around the site of your injection, tell your health care professional right away. Talk to your pediatrician regarding the use of this medicine in children. Special care may be needed. Overdosage: If you think you have taken too much of this medicine contact a poison control center or emergency room at once. NOTE: This medicine is only for you. Do not share this medicine with others. What if I miss a dose? It is important not to miss your dose. Call your doctor or health care professional if you are unable to keep an appointment. What may interact with this medicine? This medicine may interact with the following medications: -6-mercaptopurine -paclitaxel -phenytoin -St. John's Wort -trastuzumab -verapamil This list may not describe all possible interactions. Give your health care provider a list of all the medicines, herbs, non-prescription drugs, or dietary supplements you use. Also tell them if you smoke, drink alcohol, or use illegal drugs. Some items may interact with your medicine. What should I watch for while using this medicine? This drug may make you feel generally unwell. This is not uncommon, as chemotherapy can affect healthy cells as well as cancer cells. Report any side effects. Continue your course of treatment even though you feel ill unless your doctor tells you to stop. There is a maximum amount of this medicine you  should receive throughout your life. The amount depends on the medical condition being treated and your overall health. Your doctor will watch how much of this medicine you receive in your lifetime.  Tell your doctor if you have taken this medicine before. You may need blood work done while you are taking this medicine. Your urine may turn red for a few days after your dose. This is not blood. If your urine is dark or brown, call your doctor. In some cases, you may be given additional medicines to help with side effects. Follow all directions for their use. Call your doctor or health care professional for advice if you get a fever, chills or sore throat, or other symptoms of a cold or flu. Do not treat yourself. This drug decreases your body's ability to fight infections. Try to avoid being around people who are sick. This medicine may increase your risk to bruise or bleed. Call your doctor or health care professional if you notice any unusual bleeding. Talk to your doctor about your risk of cancer. You may be more at risk for certain types of cancers if you take this medicine. Do not become pregnant while taking this medicine or for 6 months after stopping it. Women should inform their doctor if they wish to become pregnant or think they might be pregnant. Men should not father a child while taking this medicine and for 6 months after stopping it. There is a potential for serious side effects to an unborn child. Talk to your health care professional or pharmacist for more information. Do not breast-feed an infant while taking this medicine. This medicine has caused ovarian failure in some women and reduced sperm counts in some men This medicine may interfere with the ability to have a child. Talk with your doctor or health care professional if you are concerned about your fertility. What side effects may I notice from receiving this medicine? Side effects that you should report to your doctor or health care professional as soon as possible: -allergic reactions like skin rash, itching or hives, swelling of the face, lips, or tongue -breathing problems -chest pain -fast or irregular heartbeat -low  blood counts - this medicine may decrease the number of white blood cells, red blood cells and platelets. You may be at increased risk for infections and bleeding. -pain, redness, or irritation at site where injected -signs of infection - fever or chills, cough, sore throat, pain or difficulty passing urine -signs of decreased platelets or bleeding - bruising, pinpoint red spots on the skin, black, tarry stools, blood in the urine -swelling of the ankles, feet, hands -tiredness -weakness Side effects that usually do not require medical attention (report to your doctor or health care professional if they continue or are bothersome): -diarrhea -hair loss -mouth sores -nail discoloration or damage -nausea -red colored urine -vomiting This list may not describe all possible side effects. Call your doctor for medical advice about side effects. You may report side effects to FDA at 1-800-FDA-1088. Where should I keep my medicine? This drug is given in a hospital or clinic and will not be stored at home. NOTE: This sheet is a summary. It may not cover all possible information. If you have questions about this medicine, talk to your doctor, pharmacist, or health care provider.  2018 Elsevier/Gold Standard (2015-03-09 11:28:51) Cyclophosphamide injection What is this medicine? CYCLOPHOSPHAMIDE (sye kloe FOSS fa mide) is a chemotherapy drug. It slows the growth of cancer cells. This medicine is used  to treat many types of cancer like lymphoma, myeloma, leukemia, breast cancer, and ovarian cancer, to name a few. This medicine may be used for other purposes; ask your health care provider or pharmacist if you have questions. COMMON BRAND NAME(S): Cytoxan, Neosar What should I tell my health care provider before I take this medicine? They need to know if you have any of these conditions: -blood disorders -history of other chemotherapy -infection -kidney disease -liver disease -recent or ongoing  radiation therapy -tumors in the bone marrow -an unusual or allergic reaction to cyclophosphamide, other chemotherapy, other medicines, foods, dyes, or preservatives -pregnant or trying to get pregnant -breast-feeding How should I use this medicine? This drug is usually given as an injection into a vein or muscle or by infusion into a vein. It is administered in a hospital or clinic by a specially trained health care professional. Talk to your pediatrician regarding the use of this medicine in children. Special care may be needed. Overdosage: If you think you have taken too much of this medicine contact a poison control center or emergency room at once. NOTE: This medicine is only for you. Do not share this medicine with others. What if I miss a dose? It is important not to miss your dose. Call your doctor or health care professional if you are unable to keep an appointment. What may interact with this medicine? This medicine may interact with the following medications: -amiodarone -amphotericin B -azathioprine -certain antiviral medicines for HIV or AIDS such as protease inhibitors (e.g., indinavir, ritonavir) and zidovudine -certain blood pressure medications such as benazepril, captopril, enalapril, fosinopril, lisinopril, moexipril, monopril, perindopril, quinapril, ramipril, trandolapril -certain cancer medications such as anthracyclines (e.g., daunorubicin, doxorubicin), busulfan, cytarabine, paclitaxel, pentostatin, tamoxifen, trastuzumab -certain diuretics such as chlorothiazide, chlorthalidone, hydrochlorothiazide, indapamide, metolazone -certain medicines that treat or prevent blood clots like warfarin -certain muscle relaxants such as succinylcholine -cyclosporine -etanercept -indomethacin -medicines to increase blood counts like filgrastim, pegfilgrastim, sargramostim -medicines used as general anesthesia -metronidazole -natalizumab This list may not describe all possible  interactions. Give your health care provider a list of all the medicines, herbs, non-prescription drugs, or dietary supplements you use. Also tell them if you smoke, drink alcohol, or use illegal drugs. Some items may interact with your medicine. What should I watch for while using this medicine? Visit your doctor for checks on your progress. This drug may make you feel generally unwell. This is not uncommon, as chemotherapy can affect healthy cells as well as cancer cells. Report any side effects. Continue your course of treatment even though you feel ill unless your doctor tells you to stop. Drink water or other fluids as directed. Urinate often, even at night. In some cases, you may be given additional medicines to help with side effects. Follow all directions for their use. Call your doctor or health care professional for advice if you get a fever, chills or sore throat, or other symptoms of a cold or flu. Do not treat yourself. This drug decreases your body's ability to fight infections. Try to avoid being around people who are sick. This medicine may increase your risk to bruise or bleed. Call your doctor or health care professional if you notice any unusual bleeding. Be careful brushing and flossing your teeth or using a toothpick because you may get an infection or bleed more easily. If you have any dental work done, tell your dentist you are receiving this medicine. You may get drowsy or dizzy. Do not drive, use  machinery, or do anything that needs mental alertness until you know how this medicine affects you. Do not become pregnant while taking this medicine or for 1 year after stopping it. Women should inform their doctor if they wish to become pregnant or think they might be pregnant. Men should not father a child while taking this medicine and for 4 months after stopping it. There is a potential for serious side effects to an unborn child. Talk to your health care professional or pharmacist for  more information. Do not breast-feed an infant while taking this medicine. This medicine may interfere with the ability to have a child. This medicine has caused ovarian failure in some women. This medicine has caused reduced sperm counts in some men. You should talk with your doctor or health care professional if you are concerned about your fertility. If you are going to have surgery, tell your doctor or health care professional that you have taken this medicine. What side effects may I notice from receiving this medicine? Side effects that you should report to your doctor or health care professional as soon as possible: -allergic reactions like skin rash, itching or hives, swelling of the face, lips, or tongue -low blood counts - this medicine may decrease the number of white blood cells, red blood cells and platelets. You may be at increased risk for infections and bleeding. -signs of infection - fever or chills, cough, sore throat, pain or difficulty passing urine -signs of decreased platelets or bleeding - bruising, pinpoint red spots on the skin, black, tarry stools, blood in the urine -signs of decreased red blood cells - unusually weak or tired, fainting spells, lightheadedness -breathing problems -dark urine -dizziness -palpitations -swelling of the ankles, feet, hands -trouble passing urine or change in the amount of urine -weight gain -yellowing of the eyes or skin Side effects that usually do not require medical attention (report to your doctor or health care professional if they continue or are bothersome): -changes in nail or skin color -hair loss -missed menstrual periods -mouth sores -nausea, vomiting This list may not describe all possible side effects. Call your doctor for medical advice about side effects. You may report side effects to FDA at 1-800-FDA-1088. Where should I keep my medicine? This drug is given in a hospital or clinic and will not be stored at  home. NOTE: This sheet is a summary. It may not cover all possible information. If you have questions about this medicine, talk to your doctor, pharmacist, or health care provider.  2018 Elsevier/Gold Standard (2011-11-25 16:22:58)

## 2017-06-09 NOTE — Progress Notes (Signed)
Patient and daughter came in to bring proof of income for [employer] and information on OOP.  Patient has almost met her OOP and may not need copay assistance for Udenyca.   Patient approved for one-time $1000 [employer]. Patient has a copy of award letter and expense sheet along with the outpatient pharmacy information. Patient received a gas card today from [employer]. Explained to daughter the expense sheet and what it covers. She verbalized understanding. Gave my card for any additional financial questions or concerns.

## 2017-06-10 ENCOUNTER — Inpatient Hospital Stay: Payer: 59

## 2017-06-10 VITALS — BP 127/52 | HR 60 | Temp 98.4°F | Resp 17

## 2017-06-10 DIAGNOSIS — C50212 Malignant neoplasm of upper-inner quadrant of left female breast: Secondary | ICD-10-CM

## 2017-06-10 DIAGNOSIS — Z170 Estrogen receptor positive status [ER+]: Secondary | ICD-10-CM

## 2017-06-10 LAB — CANCER ANTIGEN 27.29: CA 27.29: 9.8 U/mL (ref 0.0–38.6)

## 2017-06-10 MED ORDER — PEGFILGRASTIM-CBQV 6 MG/0.6ML ~~LOC~~ SOSY
6.0000 mg | PREFILLED_SYRINGE | Freq: Once | SUBCUTANEOUS | Status: AC
  Administered 2017-06-10: 6 mg via SUBCUTANEOUS

## 2017-06-10 MED ORDER — PEGFILGRASTIM-CBQV 6 MG/0.6ML ~~LOC~~ SOSY
PREFILLED_SYRINGE | SUBCUTANEOUS | Status: AC
  Filled 2017-06-10: qty 0.6

## 2017-06-10 NOTE — Patient Instructions (Signed)
Pegfilgrastim injection What is this medicine? PEGFILGRASTIM (PEG fil gra stim) is a long-acting granulocyte colony-stimulating factor that stimulates the growth of neutrophils, a type of white blood cell important in the body's fight against infection. It is used to reduce the incidence of fever and infection in patients with certain types of cancer who are receiving chemotherapy that affects the bone marrow, and to increase survival after being exposed to high doses of radiation. This medicine may be used for other purposes; ask your health care provider or pharmacist if you have questions. COMMON BRAND NAME(S): Neulasta What should I tell my health care provider before I take this medicine? They need to know if you have any of these conditions: -kidney disease -latex allergy -ongoing radiation therapy -sickle cell disease -skin reactions to acrylic adhesives (On-Body Injector only) -an unusual or allergic reaction to pegfilgrastim, filgrastim, other medicines, foods, dyes, or preservatives -pregnant or trying to get pregnant -breast-feeding How should I use this medicine? This medicine is for injection under the skin. If you get this medicine at home, you will be taught how to prepare and give the pre-filled syringe or how to use the On-body Injector. Refer to the patient Instructions for Use for detailed instructions. Use exactly as directed. Tell your healthcare provider immediately if you suspect that the On-body Injector may not have performed as intended or if you suspect the use of the On-body Injector resulted in a missed or partial dose. It is important that you put your used needles and syringes in a special sharps container. Do not put them in a trash can. If you do not have a sharps container, call your pharmacist or healthcare provider to get one. Talk to your pediatrician regarding the use of this medicine in children. While this drug may be prescribed for selected conditions,  precautions do apply. Overdosage: If you think you have taken too much of this medicine contact a poison control center or emergency room at once. NOTE: This medicine is only for you. Do not share this medicine with others. What if I miss a dose? It is important not to miss your dose. Call your doctor or health care professional if you miss your dose. If you miss a dose due to an On-body Injector failure or leakage, a new dose should be administered as soon as possible using a single prefilled syringe for manual use. What may interact with this medicine? Interactions have not been studied. Give your health care provider a list of all the medicines, herbs, non-prescription drugs, or dietary supplements you use. Also tell them if you smoke, drink alcohol, or use illegal drugs. Some items may interact with your medicine. This list may not describe all possible interactions. Give your health care provider a list of all the medicines, herbs, non-prescription drugs, or dietary supplements you use. Also tell them if you smoke, drink alcohol, or use illegal drugs. Some items may interact with your medicine. What should I watch for while using this medicine? You may need blood work done while you are taking this medicine. If you are going to need a MRI, CT scan, or other procedure, tell your doctor that you are using this medicine (On-Body Injector only). What side effects may I notice from receiving this medicine? Side effects that you should report to your doctor or health care professional as soon as possible: -allergic reactions like skin rash, itching or hives, swelling of the face, lips, or tongue -dizziness -fever -pain, redness, or irritation at site   where injected -pinpoint red spots on the skin -red or dark-brown urine -shortness of breath or breathing problems -stomach or side pain, or pain at the shoulder -swelling -tiredness -trouble passing urine or change in the amount of urine Side  effects that usually do not require medical attention (report to your doctor or health care professional if they continue or are bothersome): -bone pain -muscle pain This list may not describe all possible side effects. Call your doctor for medical advice about side effects. You may report side effects to FDA at 1-800-FDA-1088. Where should I keep my medicine? Keep out of the reach of children. Store pre-filled syringes in a refrigerator between 2 and 8 degrees C (36 and 46 degrees F). Do not freeze. Keep in carton to protect from light. Throw away this medicine if it is left out of the refrigerator for more than 48 hours. Throw away any unused medicine after the expiration date. NOTE: This sheet is a summary. It may not cover all possible information. If you have questions about this medicine, talk to your doctor, pharmacist, or health care provider.  2018 Elsevier/Gold Standard (2016-01-07 12:58:03)  

## 2017-06-12 ENCOUNTER — Telehealth: Payer: Self-pay | Admitting: Hematology

## 2017-06-12 ENCOUNTER — Telehealth: Payer: Self-pay

## 2017-06-12 NOTE — Telephone Encounter (Signed)
Left voice message on daughter's cell phone, BG high, patient needs to watch her diabetic diet, check BG at home, BG will be higher after chemo due to steroids.

## 2017-06-12 NOTE — Telephone Encounter (Signed)
-----   Message from [name], MD sent at 06/12/2017  8:19 AM EDT ----- Please call pt's daughter and let her know that her BG has been high, she needs to watch her diabetic diet and BG level at home, it will be worse after chemo due to pre-med steroids, f/u with PCP. Thanks   [name]  06/12/2017

## 2017-06-12 NOTE — Telephone Encounter (Signed)
Appointments scheduled per phone call from [facility] 5/17

## 2017-06-13 ENCOUNTER — Ambulatory Visit: Payer: 59 | Admitting: Physical Therapy

## 2017-06-13 ENCOUNTER — Telehealth: Payer: Self-pay

## 2017-06-13 ENCOUNTER — Encounter: Payer: Self-pay | Admitting: Physical Therapy

## 2017-06-13 DIAGNOSIS — M25562 Pain in left knee: Secondary | ICD-10-CM | POA: Diagnosis not present

## 2017-06-13 DIAGNOSIS — M25662 Stiffness of left knee, not elsewhere classified: Secondary | ICD-10-CM

## 2017-06-13 DIAGNOSIS — R262 Difficulty in walking, not elsewhere classified: Secondary | ICD-10-CM

## 2017-06-13 NOTE — Telephone Encounter (Signed)
Called patient's daughter [name] for follow up from [facility] chemotherapy and she reports that patient did well had a little nausea but was relieved by nausea medication. Denies any bowel or any other problems.   Encouraged to call with any questions or concerns.

## 2017-06-13 NOTE — Therapy (Signed)
[place], [place], [address] Phone: [phone]   Fax:  [phone]  Physical Therapy Treatment  Patient Details  Name: Amanda Duarte MRN: [id number] Date of Birth: January 18, 1956 Referring Provider: Katha Hamming, MD   Encounter Date: 06/13/2017  PT End of Session - 06/13/17 1152    Visit Number  2    Number of Visits  13    Date for PT Re-Evaluation  08/06/17    PT Start Time  1104    PT Stop Time  1147    PT Time Calculation (min)  43 min    Activity Tolerance  Patient tolerated treatment well    Behavior During Therapy  [facility] for tasks assessed/performed       Past Medical History:  Diagnosis Date  . Cancer ([place]) 03/2017   left breast cancer  . Diabetes mellitus without complication ([place])   . Hyperlipidemia   . Hypertension   . Tibial plateau fracture, left    04-13-17 had ORIF    Past Surgical History:  Procedure Laterality Date  . ORIF TIBIA PLATEAU Left 04/13/2017   Procedure: OPEN REDUCTION INTERNAL FIXATION (ORIF) TIBIAL PLATEAU;  Surgeon: Shona Needles, MD;  Location: [place];  Service: Orthopedics;  Laterality: Left;  . PORTACATH PLACEMENT Right 05/31/2017   Procedure: INSERTION PORT-A-CATH;  Surgeon: Fanny Skates, MD;  Location: [place];  Service: General;  Laterality: Right;    There were no vitals filed for this visit.  Subjective Assessment - 06/13/17 1107    Subjective  has been doing her HEp.  I feel Like I have seen improved motion.  Started chemo for cancer last Friday.    Patient is accompained by:  Interpreter    Currently in Pain?  No/denies    Pain Score  0-No pain    Multiple Pain Sites  -- had medial knee pain yesterday torn ligament  .  where they operated it does not hurt                       [facility] Adult PT Treatment/Exercise - 06/13/17 0001      Ambulation/Gait   Pre-Gait Activities  weight shifting with brace,  CGA and use of  walker     Gait Comments  education WBAT walked 10 feet with walker.  She ould have gone more however she did not have a shoe.  Step to gait pattern .  Moves WBAT without note of pain.       Knee/Hip Exercises: Seated   Long Arc Quad  10 reps    Heel Slides  AROM;10 reps;Limitations feels  stretch      Knee/Hip Exercises: Supine   Quad Sets  10 reps heavy cues    Heel Slides  AAROM;Left;10 reps;Limitations    Heel Slides Limitations  with Strap lateral  distal quad tightness      Manual Therapy   Manual Therapy  Passive ROM    Passive ROM  left knee  gentle with distal patellar glides             PT Education - 06/13/17 1152    Education provided  Yes    Education Details  Gait WBAT  wear shoe, brace and use walker.    Person(s) Educated  Patient    Methods  Explanation;Tactile cues;Demonstration;Verbal cues    Comprehension  Verbalized understanding;Returned demonstration          PT Long Term  Goals - 06/06/17 1340      PT LONG TERM GOAL #1   Title  Pt will be independent in her HEP and progression.     Time  6    Period  Weeks    Status  New    Target Date  07/11/17      PT LONG TERM GOAL #2   Title  Pt will improve her FOTO score from 66% limitation to </ 42% limitation.     Time  6    Period  Weeks    Status  New    Target Date  07/11/17      PT LONG TERM GOAL #3   Title  Pt will improve her left knee flexion to >/= 120 degrees in order to improve gait and functional mobility.     Time  6    Period  Weeks    Target Date  07/11/17      PT LONG TERM GOAL #4   Title  Pt will improve her L Knee strength to >/= 4+/5 in order to improve functional mobility and gait.     Time  6    Period  Weeks    Status  New    Target Date  07/11/17            Plan - 06/13/17 1153    Clinical Impression Statement  Patient has increased knee flexion to 90+ degrees with mild distal quad pain.  She was able to initiate WBAT today.  She tolerated with no complaints  of pain.  She will wear shoes next visit.     PT Next Visit Plan  ROM and gentle strengthening L Knee,  Consider using parallel bars,  Nustep/ bike for ROM      PT Home Exercise Plan  quadsets, assisted heel slides    Consulted and Agree with Plan of Care  Patient       Patient will benefit from skilled therapeutic intervention in order to improve the following deficits and impairments:     Visit Diagnosis: Acute pain of left knee  Difficulty in walking, not elsewhere classified  Stiffness of left knee, not elsewhere classified     Problem List Patient Active Problem List   Diagnosis Date Noted  . Port-A-Cath in place 06/09/2017  . Malignant neoplasm of upper-inner quadrant of left breast in female, estrogen receptor positive ([place]) 05/22/2017  . Closed fracture of left tibial plateau   . Axillary lymphadenopathy 04/12/2017  . Closed fracture of lateral portion of left tibial plateau 04/11/2017  . Chest pain, neg MI, neg stress Nuc study, possible GI 05/19/2014  . Essential hypertension 05/19/2014  . DM (diabetes mellitus) ([place]) 05/19/2014    HARRIS,KAREN PTA 06/13/2017, 11:56 AM  [facility] [address] [place], [place], [address] Phone: [phone]   Fax:  [phone]  Name: Amanda Duarte MRN: [id number] Date of Birth: [birth year]

## 2017-06-13 NOTE — Telephone Encounter (Signed)
-----   Message from Flo Shanks, RN sent at 06/09/2017 12:53 PM EDT ----- Regarding: [name] First time chemo First time Cytoxan and Adriamycin. Please call. Tolerated with no problems.

## 2017-06-14 ENCOUNTER — Telehealth: Payer: Self-pay | Admitting: Hematology

## 2017-06-14 NOTE — Telephone Encounter (Signed)
Appointment [facility] called and left voicemail for patient regarding appointment per 5/17 sch msg

## 2017-06-15 ENCOUNTER — Ambulatory Visit: Payer: 59 | Admitting: Physical Therapy

## 2017-06-16 ENCOUNTER — Inpatient Hospital Stay: Payer: 59

## 2017-06-16 ENCOUNTER — Inpatient Hospital Stay: Payer: 59 | Admitting: Nurse Practitioner
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
## 2017-06-20 ENCOUNTER — Encounter: Payer: Self-pay | Admitting: Physical Therapy

## 2017-06-20 ENCOUNTER — Ambulatory Visit: Payer: 59 | Admitting: Physical Therapy

## 2017-06-20 DIAGNOSIS — R262 Difficulty in walking, not elsewhere classified: Secondary | ICD-10-CM

## 2017-06-20 DIAGNOSIS — M25562 Pain in left knee: Secondary | ICD-10-CM

## 2017-06-20 DIAGNOSIS — M25662 Stiffness of left knee, not elsewhere classified: Secondary | ICD-10-CM

## 2017-06-20 NOTE — Patient Instructions (Addendum)
Hip Extension, Knee Bent    Doble la rodilla derecha a 90. Mueva la pierna hacia atrs manteniendo la rodilla doblada. Repita 10____ veces/sesin. Haga __1-2__ sesiones/semana.   Copyright  VHI. All rights reserved.

## 2017-06-20 NOTE — Therapy (Signed)
[place], [place], [address] Phone: [phone]   Fax:  [phone]  Physical Therapy Treatment  Patient Details  Name: Amanda Duarte MRN: [id number] Date of Birth: [birth year] Referring Provider: Katha Hamming, MD   Encounter Date: 06/20/2017  PT End of Session - 06/20/17 1213    Visit Number  3    Number of Visits  13    Date for PT Re-Evaluation  08/06/17    PT Start Time  1108    PT Stop Time  1148    PT Time Calculation (min)  40 min    Behavior During Therapy  [facility] for tasks assessed/performed       Past Medical History:  Diagnosis Date  . Cancer ([place]) 03/2017   left breast cancer  . Diabetes mellitus without complication ([place])   . Hyperlipidemia   . Hypertension   . Tibial plateau fracture, left    04-13-17 had ORIF    Past Surgical History:  Procedure Laterality Date  . ORIF TIBIA PLATEAU Left 04/13/2017   Procedure: OPEN REDUCTION INTERNAL FIXATION (ORIF) TIBIAL PLATEAU;  Surgeon: Shona Needles, MD;  Location: [place];  Service: Orthopedics;  Laterality: Left;  . PORTACATH PLACEMENT Right 05/31/2017   Procedure: INSERTION PORT-A-CATH;  Surgeon: Fanny Skates, MD;  Location: [place];  Service: General;  Laterality: Right;    There were no vitals filed for this visit.  Subjective Assessment - 06/20/17 1119    Subjective  Able to walk in the park with the walker and her old shoes.   Shew did not wear shoe today.  She is concerned about finances with PT.      Patient is accompained by:  Interpreter    Currently in Pain?  No/denies         [facility] PT Assessment - 06/20/17 0001      AROM   Left Knee Flexion  113      PROM   Left Knee Flexion  115                   [facility] Adult PT Treatment/Exercise - 06/20/17 0001      Ambulation/Gait   Ambulation/Gait  Yes    Assistive device  Rolling walker    Pre-Gait Activities  Weight shifting with brace 3  positions    Gait Comments  Anti skid used for safety for standing exercises in sock       Knee/Hip Exercises: Aerobic   Nustep  5 minutes UE/LE  L 4  with brace.        Knee/Hip Exercises: Standing   Heel Raises  10 reps    Knee Flexion  10 reps HEP  left,    Hip Abduction  10 reps Unable to lift right    Hip Extension  10 reps    Other Standing Knee Exercises  wall slides facing wall 10 x each,  CGA for saftey,  mod cued initially    Other Standing Knee Exercises  hip knee flexion 10 X  holding counter      Knee/Hip Exercises: Seated   Hamstring Curl  10 reps    Hamstring Limitations  red band  issued for home,  Family will hold,  she declines handout      Knee/Hip Exercises: Supine   Quad Sets  10 reps    Heel Slides  10 reps    Straight Leg Raises  10 reps    Patellar  Mobs  checked             PT Education - 06/20/17 1212    Education provided  Yes    Education Details  exercise form,  HEP,  Gait    Person(s) Educated  Patient    Methods  Explanation;Demonstration;Tactile cues;Verbal cues;Handout    Comprehension  Verbalized understanding;Returned demonstration          PT Long Term Goals - 06/06/17 1340      PT LONG TERM GOAL #1   Title  Pt will be independent in her HEP and progression.     Time  6    Period  Weeks    Status  New    Target Date  07/11/17      PT LONG TERM GOAL #2   Title  Pt will improve her FOTO score from 66% limitation to </ 42% limitation.     Time  6    Period  Weeks    Status  New    Target Date  07/11/17      PT LONG TERM GOAL #3   Title  Pt will improve her left knee flexion to >/= 120 degrees in order to improve gait and functional mobility.     Time  6    Period  Weeks    Target Date  07/11/17      PT LONG TERM GOAL #4   Title  Pt will improve her L Knee strength to >/= 4+/5 in order to improve functional mobility and gait.     Time  6    Period  Weeks    Status  New    Target Date  07/11/17             Plan - 06/20/17 1309    Clinical Impression Statement  AROM 113.  No pain at end of session.  She wanted to progress with exercises but with sock only , I told her there was only so miuch we could do.      PT Next Visit Plan  ROM and gentle strengthening L Knee,  Consider using parallel bars,  Nustep/ bike for ROM  See if she can hip SLR 3 way both    PT Home Exercise Plan  quadsets, assisted heel slides,  knee flexion standing    Consulted and Agree with Plan of Care  Patient       Patient will benefit from skilled therapeutic intervention in order to improve the following deficits and impairments:     Visit Diagnosis: Acute pain of left knee  Difficulty in walking, not elsewhere classified  Stiffness of left knee, not elsewhere classified     Problem List Patient Active Problem List   Diagnosis Date Noted  . Port-A-Cath in place 06/09/2017  . Malignant neoplasm of upper-inner quadrant of left breast in female, estrogen receptor positive ([place]) 05/22/2017  . Closed fracture of left tibial plateau   . Axillary lymphadenopathy 04/12/2017  . Closed fracture of lateral portion of left tibial plateau 04/11/2017  . Chest pain, neg MI, neg stress Nuc study, possible GI 05/19/2014  . Essential hypertension 05/19/2014  . DM (diabetes mellitus) ([place]) 05/19/2014    Armine Rizzolo  PTA 06/20/2017, 1:13 PM  [facility] [address] [place], [place], [address] Phone: [phone]   Fax:  [phone]  Name: Amanda Duarte MRN: [id number] Date of Birth: 1955/02/03

## 2017-06-21 ENCOUNTER — Other Ambulatory Visit: Payer: Self-pay | Admitting: Medical Oncology

## 2017-06-22 ENCOUNTER — Ambulatory Visit: Payer: 59 | Admitting: Physical Therapy
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
## 2017-06-23 ENCOUNTER — Ambulatory Visit: Payer: 59

## 2017-06-23 ENCOUNTER — Ambulatory Visit: Payer: 59 | Admitting: Hematology

## 2017-06-23 ENCOUNTER — Inpatient Hospital Stay: Payer: 59

## 2017-06-23 ENCOUNTER — Inpatient Hospital Stay (HOSPITAL_BASED_OUTPATIENT_CLINIC_OR_DEPARTMENT_OTHER): Payer: 59 | Admitting: Hematology

## 2017-06-23 ENCOUNTER — Other Ambulatory Visit: Payer: 59

## 2017-06-23 ENCOUNTER — Encounter: Payer: Self-pay | Admitting: Hematology

## 2017-06-23 ENCOUNTER — Telehealth: Payer: Self-pay | Admitting: Hematology

## 2017-06-23 VITALS — BP 154/64 | HR 59 | Temp 98.4°F | Resp 17 | Ht 59.0 in | Wt 131.5 lb

## 2017-06-23 DIAGNOSIS — E119 Type 2 diabetes mellitus without complications: Secondary | ICD-10-CM | POA: Diagnosis not present

## 2017-06-23 DIAGNOSIS — I10 Essential (primary) hypertension: Secondary | ICD-10-CM | POA: Diagnosis not present

## 2017-06-23 DIAGNOSIS — Z170 Estrogen receptor positive status [ER+]: Secondary | ICD-10-CM

## 2017-06-23 DIAGNOSIS — C50212 Malignant neoplasm of upper-inner quadrant of left female breast: Secondary | ICD-10-CM

## 2017-06-23 DIAGNOSIS — Z171 Estrogen receptor negative status [ER-]: Secondary | ICD-10-CM | POA: Diagnosis not present

## 2017-06-23 DIAGNOSIS — C773 Secondary and unspecified malignant neoplasm of axilla and upper limb lymph nodes: Secondary | ICD-10-CM | POA: Diagnosis not present

## 2017-06-23 DIAGNOSIS — Z95828 Presence of other vascular implants and grafts: Secondary | ICD-10-CM

## 2017-06-23 DIAGNOSIS — E118 Type 2 diabetes mellitus with unspecified complications: Secondary | ICD-10-CM

## 2017-06-23 LAB — CMP (CANCER CENTER ONLY)
ALK PHOS: 125 U/L (ref 40–150)
ALT: 16 U/L (ref 0–55)
AST: 21 U/L (ref 5–34)
Albumin: 3.9 g/dL (ref 3.5–5.0)
Anion gap: 13 — ABNORMAL HIGH (ref 3–11)
BUN: 7 mg/dL (ref 7–26)
CALCIUM: 9.6 mg/dL (ref 8.4–10.4)
CO2: 25 mmol/L (ref 22–29)
Chloride: 100 mmol/L (ref 98–109)
Creatinine: 0.71 mg/dL (ref 0.60–1.10)
GFR, Est AFR Am: >60 mL/min (ref >60)
GFR, Estimated: >60 mL/min (ref >60)
GLUCOSE: 177 mg/dL — AB (ref 70–140)
Potassium: 3.7 mmol/L (ref 3.5–5.1)
SODIUM: 138 mmol/L (ref 136–145)
Total Bilirubin: <0.2 mg/dL — ABNORMAL LOW (ref 0.2–1.2)
Total Protein: 7.6 g/dL (ref 6.4–8.3)

## 2017-06-23 LAB — CBC WITH DIFFERENTIAL (CANCER CENTER ONLY)
BASOS PCT: 1 %
Basophils Absolute: 0.1 10*3/uL (ref 0.0–0.1)
Eosinophils Absolute: 0.1 10*3/uL (ref 0.0–0.5)
Eosinophils Relative: 1 %
HCT: 38 % (ref 34.8–46.6)
Hemoglobin: 13 g/dL (ref 11.6–15.9)
Lymphocytes Relative: 27 %
Lymphs Abs: 2.5 10*3/uL (ref 0.9–3.3)
MCH: 31.7 pg (ref 25.1–34.0)
MCHC: 34.3 g/dL (ref 31.5–36.0)
MCV: 92.4 fL (ref 79.5–101.0)
MONO ABS: 0.8 10*3/uL (ref 0.1–0.9)
MONOS PCT: 9 %
Neutro Abs: 5.6 10*3/uL (ref 1.5–6.5)
Neutrophils Relative %: 62 %
Platelet Count: 226 10*3/uL (ref 145–400)
RBC: 4.11 MIL/uL (ref 3.70–5.45)
RDW: 13.2 % (ref 11.2–14.5)
WBC Count: 9 10*3/uL (ref 3.9–10.3)

## 2017-06-23 MED ORDER — SODIUM CHLORIDE 0.9% FLUSH
10.0000 mL | INTRAVENOUS | Status: DC | PRN
  Administered 2017-06-23: 10 mL
  Filled 2017-06-23: qty 10

## 2017-06-23 MED ORDER — SODIUM CHLORIDE 0.9 % IV SOLN
600.0000 mg/m2 | Freq: Once | INTRAVENOUS | Status: AC
  Administered 2017-06-23: 940 mg via INTRAVENOUS
  Filled 2017-06-23: qty 47

## 2017-06-23 MED ORDER — PALONOSETRON HCL INJECTION 0.25 MG/5ML
INTRAVENOUS | Status: AC
  Filled 2017-06-23: qty 5

## 2017-06-23 MED ORDER — PALONOSETRON HCL INJECTION 0.25 MG/5ML
0.2500 mg | Freq: Once | INTRAVENOUS | Status: AC
  Administered 2017-06-23: 0.25 mg via INTRAVENOUS

## 2017-06-23 MED ORDER — SODIUM CHLORIDE 0.9 % IV SOLN
Freq: Once | INTRAVENOUS | Status: AC
  Administered 2017-06-23: 10:00:00 via INTRAVENOUS
  Filled 2017-06-23: qty 5

## 2017-06-23 MED ORDER — SODIUM CHLORIDE 0.9 % IV SOLN
Freq: Once | INTRAVENOUS | Status: AC
  Administered 2017-06-23: 10:00:00 via INTRAVENOUS

## 2017-06-23 MED ORDER — HEPARIN SOD (PORK) LOCK FLUSH 100 UNIT/ML IV SOLN
500.0000 [IU] | Freq: Once | INTRAVENOUS | Status: AC | PRN
  Administered 2017-06-23: 500 [IU]
  Filled 2017-06-23: qty 5

## 2017-06-23 MED ORDER — DOXORUBICIN HCL CHEMO IV INJECTION 2 MG/ML
60.0000 mg/m2 | Freq: Once | INTRAVENOUS | Status: AC
  Administered 2017-06-23: 94 mg via INTRAVENOUS
  Filled 2017-06-23: qty 47

## 2017-06-23 NOTE — Patient Instructions (Signed)
[facility] Discharge Instructions for Patients Receiving Chemotherapy  Today you received the following chemotherapy agents adriamycin/cytoxan  To help prevent nausea and vomiting after your treatment, we encourage you to take your nausea medication as directed  If you develop nausea and vomiting that is not controlled by your nausea medication, call the clinic.   BELOW ARE SYMPTOMS THAT SHOULD BE REPORTED IMMEDIATELY:  *FEVER GREATER THAN 100.5 F  *CHILLS WITH OR WITHOUT FEVER  NAUSEA AND VOMITING THAT IS NOT CONTROLLED WITH YOUR NAUSEA MEDICATION  *UNUSUAL SHORTNESS OF BREATH  *UNUSUAL BRUISING OR BLEEDING  TENDERNESS IN MOUTH AND THROAT WITH OR WITHOUT PRESENCE OF ULCERS  *URINARY PROBLEMS  *BOWEL PROBLEMS  UNUSUAL RASH Items with * indicate a potential emergency and should be followed up as soon as possible.  Feel free to call the clinic you have any questions or concerns. The clinic phone number is [phone].  

## 2017-06-23 NOTE — Telephone Encounter (Signed)
No los 5/31

## 2017-06-24 ENCOUNTER — Inpatient Hospital Stay: Payer: 59 | Attending: Nurse Practitioner

## 2017-06-24 VITALS — BP 109/57 | HR 58 | Temp 98.2°F | Resp 18

## 2017-06-24 DIAGNOSIS — Z5189 Encounter for other specified aftercare: Secondary | ICD-10-CM | POA: Insufficient documentation

## 2017-06-24 DIAGNOSIS — C773 Secondary and unspecified malignant neoplasm of axilla and upper limb lymph nodes: Secondary | ICD-10-CM | POA: Insufficient documentation

## 2017-06-24 DIAGNOSIS — Z170 Estrogen receptor positive status [ER+]: Secondary | ICD-10-CM

## 2017-06-24 DIAGNOSIS — C50212 Malignant neoplasm of upper-inner quadrant of left female breast: Secondary | ICD-10-CM | POA: Insufficient documentation

## 2017-06-24 DIAGNOSIS — Z5111 Encounter for antineoplastic chemotherapy: Secondary | ICD-10-CM | POA: Insufficient documentation

## 2017-06-24 MED ORDER — PEGFILGRASTIM-CBQV 6 MG/0.6ML ~~LOC~~ SOSY
6.0000 mg | PREFILLED_SYRINGE | Freq: Once | SUBCUTANEOUS | Status: AC
  Administered 2017-06-24: 6 mg via SUBCUTANEOUS

## 2017-06-24 MED ORDER — PEGFILGRASTIM-CBQV 6 MG/0.6ML ~~LOC~~ SOSY
PREFILLED_SYRINGE | SUBCUTANEOUS | Status: AC
Start: 2017-06-24 — End: ?
  Filled 2017-06-24: qty 0.6

## 2017-06-24 NOTE — Patient Instructions (Signed)
Pegfilgrastim injection What is this medicine? PEGFILGRASTIM (PEG fil gra stim) is a long-acting granulocyte colony-stimulating factor that stimulates the growth of neutrophils, a type of white blood cell important in the body's fight against infection. It is used to reduce the incidence of fever and infection in patients with certain types of cancer who are receiving chemotherapy that affects the bone marrow, and to increase survival after being exposed to high doses of radiation. This medicine may be used for other purposes; ask your health care provider or pharmacist if you have questions. COMMON BRAND NAME(S): Neulasta What should I tell my health care provider before I take this medicine? They need to know if you have any of these conditions: -kidney disease -latex allergy -ongoing radiation therapy -sickle cell disease -skin reactions to acrylic adhesives (On-Body Injector only) -an unusual or allergic reaction to pegfilgrastim, filgrastim, other medicines, foods, dyes, or preservatives -pregnant or trying to get pregnant -breast-feeding How should I use this medicine? This medicine is for injection under the skin. If you get this medicine at home, you will be taught how to prepare and give the pre-filled syringe or how to use the On-body Injector. Refer to the patient Instructions for Use for detailed instructions. Use exactly as directed. Tell your healthcare provider immediately if you suspect that the On-body Injector may not have performed as intended or if you suspect the use of the On-body Injector resulted in a missed or partial dose. It is important that you put your used needles and syringes in a special sharps container. Do not put them in a trash can. If you do not have a sharps container, call your pharmacist or healthcare provider to get one. Talk to your pediatrician regarding the use of this medicine in children. While this drug may be prescribed for selected conditions,  precautions do apply. Overdosage: If you think you have taken too much of this medicine contact a poison control center or emergency room at once. NOTE: This medicine is only for you. Do not share this medicine with others. What if I miss a dose? It is important not to miss your dose. Call your doctor or health care professional if you miss your dose. If you miss a dose due to an On-body Injector failure or leakage, a new dose should be administered as soon as possible using a single prefilled syringe for manual use. What may interact with this medicine? Interactions have not been studied. Give your health care provider a list of all the medicines, herbs, non-prescription drugs, or dietary supplements you use. Also tell them if you smoke, drink alcohol, or use illegal drugs. Some items may interact with your medicine. This list may not describe all possible interactions. Give your health care provider a list of all the medicines, herbs, non-prescription drugs, or dietary supplements you use. Also tell them if you smoke, drink alcohol, or use illegal drugs. Some items may interact with your medicine. What should I watch for while using this medicine? You may need blood work done while you are taking this medicine. If you are going to need a MRI, CT scan, or other procedure, tell your doctor that you are using this medicine (On-Body Injector only). What side effects may I notice from receiving this medicine? Side effects that you should report to your doctor or health care professional as soon as possible: -allergic reactions like skin rash, itching or hives, swelling of the face, lips, or tongue -dizziness -fever -pain, redness, or irritation at site   where injected -pinpoint red spots on the skin -red or dark-brown urine -shortness of breath or breathing problems -stomach or side pain, or pain at the shoulder -swelling -tiredness -trouble passing urine or change in the amount of urine Side  effects that usually do not require medical attention (report to your doctor or health care professional if they continue or are bothersome): -bone pain -muscle pain This list may not describe all possible side effects. Call your doctor for medical advice about side effects. You may report side effects to FDA at 1-800-FDA-1088. Where should I keep my medicine? Keep out of the reach of children. Store pre-filled syringes in a refrigerator between 2 and 8 degrees C (36 and 46 degrees F). Do not freeze. Keep in carton to protect from light. Throw away this medicine if it is left out of the refrigerator for more than 48 hours. Throw away any unused medicine after the expiration date. NOTE: This sheet is a summary. It may not cover all possible information. If you have questions about this medicine, talk to your doctor, pharmacist, or health care provider.  2018 Elsevier/Gold Standard (2016-01-07 12:58:03)  

## 2017-06-26 ENCOUNTER — Encounter: Payer: Self-pay | Admitting: Nurse Practitioner

## 2017-06-26 ENCOUNTER — Encounter

## 2017-06-26 ENCOUNTER — Ambulatory Visit: Payer: 59 | Attending: Nurse Practitioner | Admitting: Nurse Practitioner

## 2017-06-26 VITALS — BP 134/71 | HR 67 | Temp 98.7°F | Ht 59.0 in | Wt 135.0 lb

## 2017-06-26 DIAGNOSIS — E1169 Type 2 diabetes mellitus with other specified complication: Secondary | ICD-10-CM | POA: Insufficient documentation

## 2017-06-26 DIAGNOSIS — E138 Other specified diabetes mellitus with unspecified complications: Secondary | ICD-10-CM | POA: Diagnosis not present

## 2017-06-26 DIAGNOSIS — Z79899 Other long term (current) drug therapy: Secondary | ICD-10-CM | POA: Diagnosis not present

## 2017-06-26 DIAGNOSIS — Z853 Personal history of malignant neoplasm of breast: Secondary | ICD-10-CM | POA: Insufficient documentation

## 2017-06-26 DIAGNOSIS — I10 Essential (primary) hypertension: Secondary | ICD-10-CM | POA: Insufficient documentation

## 2017-06-26 DIAGNOSIS — Z7984 Long term (current) use of oral hypoglycemic drugs: Secondary | ICD-10-CM | POA: Diagnosis not present

## 2017-06-26 DIAGNOSIS — Z9889 Other specified postprocedural states: Secondary | ICD-10-CM | POA: Insufficient documentation

## 2017-06-26 DIAGNOSIS — E785 Hyperlipidemia, unspecified: Secondary | ICD-10-CM | POA: Insufficient documentation

## 2017-06-26 LAB — POCT GLYCOSYLATED HEMOGLOBIN (HGB A1C): Hemoglobin A1C: 9.2 % — AB (ref 4.0–5.6)

## 2017-06-26 LAB — GLUCOSE, POCT (MANUAL RESULT ENTRY): POC GLUCOSE: 221 mg/dL — AB (ref 70–99)

## 2017-06-26 MED ORDER — SITAGLIPTIN PHOS-METFORMIN HCL 50-1000 MG PO TABS: 1.0000 | ORAL_TABLET | Freq: Two times a day (BID) | ORAL | 1 refills | Status: DC

## 2017-06-26 MED ORDER — GLIMEPIRIDE 4 MG PO TABS: 4.0000 mg | ORAL_TABLET | Freq: Every day | ORAL | 2 refills | Status: DC
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
## 2017-06-29 ENCOUNTER — Ambulatory Visit: Payer: 59 | Attending: Student | Admitting: Physical Therapy

## 2017-06-29 ENCOUNTER — Encounter: Payer: Self-pay | Admitting: Physical Therapy

## 2017-06-29 DIAGNOSIS — M25562 Pain in left knee: Secondary | ICD-10-CM | POA: Diagnosis not present

## 2017-06-29 DIAGNOSIS — R262 Difficulty in walking, not elsewhere classified: Secondary | ICD-10-CM

## 2017-06-29 DIAGNOSIS — M25662 Stiffness of left knee, not elsewhere classified: Secondary | ICD-10-CM

## 2017-06-29 NOTE — Therapy (Addendum)
[place], [place], [address] Phone: [phone]   Fax:  [phone]  Physical Therapy Treatment Discharge  Patient Details  Name: Amanda Duarte MRN: [id number] Date of Birth: 1955/12/12 Referring Provider: Katha Hamming, MD   Encounter Date: 06/29/2017  PT End of Session - 06/29/17 1119    Visit Number  4    Number of Visits  13    Date for PT Re-Evaluation  08/06/17    PT Start Time  1103    PT Stop Time  1145    PT Time Calculation (min)  42 min    Activity Tolerance  Patient tolerated treatment well    Behavior During Therapy  [facility] for tasks assessed/performed       Past Medical History:  Diagnosis Date  . Cancer ([place]) 03/2017   left breast cancer  . Diabetes mellitus without complication ([place])   . Hyperlipidemia   . Hypertension   . Tibial plateau fracture, left    04-13-17 had ORIF    Past Surgical History:  Procedure Laterality Date  . ORIF TIBIA PLATEAU Left 04/13/2017   Procedure: OPEN REDUCTION INTERNAL FIXATION (ORIF) TIBIAL PLATEAU;  Surgeon: Shona Needles, MD;  Location: [place];  Service: Orthopedics;  Laterality: Left;  . PORTACATH PLACEMENT Right 05/31/2017   Procedure: INSERTION PORT-A-CATH;  Surgeon: Fanny Skates, MD;  Location: [place];  Service: General;  Laterality: Right;    There were no vitals filed for this visit.  Subjective Assessment - 06/29/17 1104    Subjective  Pt arriving to therapy using RW and reporting 2/10 pain in her L knee/leg. Pt reported she has been sore since going to Chemo last Friday (06/22/17).    Pertinent History  NWB on LLE, agressive ROM, WBAT on 06/13/17. Per Dr Lennette Bihari [name] orders.     Currently in Pain?  Yes    Pain Score  2     Pain Orientation  Left    Pain Descriptors / Indicators  Aching    Pain Type  Acute pain    Aggravating Factors   chemo    Pain Relieving Factors  rubbing cream on aching parts    Multiple Pain  Sites  Yes    Pain Location  Neck    Pain Orientation  Mid    Pain Descriptors / Indicators  Aching    Pain Type  Acute pain                       [facility] Adult PT Treatment/Exercise - 06/29/17 0001      Knee/Hip Exercises: Standing   Heel Raises  10 reps    Knee Flexion  10 reps HEP  left,    Hip Abduction  10 reps Unable to lift right    Hip Extension  10 reps    Other Standing Knee Exercises  wall slides facing wall 10 x each,  CGA for saftey,  mod cued initially    Other Standing Knee Exercises  hip knee flexion 10 X  holding counter      Knee/Hip Exercises: Supine   Short Arc Quad Sets  10 reps    Heel Slides  10 reps    Straight Leg Raises  10 reps    Straight Leg Raise with External Rotation  10 reps      Modalities   Modalities  Cryotherapy      Cryotherapy   Number Minutes Cryotherapy  8 Minutes    Cryotherapy Location  Knee    Type of Cryotherapy  Ice pack                  PT Long Term Goals - 06/29/17 1130      PT LONG TERM GOAL #1   Title  Pt will be independent in her HEP and progression.     Time  6    Period  Weeks    Status  On-going      PT LONG TERM GOAL #2   Title  Pt will improve her FOTO score from 66% limitation to </ 42% limitation.     Time  6    Period  Weeks    Status  New      PT LONG TERM GOAL #3   Title  Pt will improve her left knee flexion to >/= 120 degrees in order to improve gait and functional mobility.     Baseline  currently 115 degree left knee flexion    Time  6    Period  Weeks    Status  On-going      PT LONG TERM GOAL #4   Title  Pt will improve her L Knee strength to >/= 4+/5 in order to improve functional mobility and gait.     Time  6    Period  Weeks    Status  New            Plan - 06/29/17 1125    Clinical Impression Statement  Pt arriving  to therapy reporting 2/10 pain on her medial left knee. Pt tolerating all exercises well. AROM in left knee was to 115 degrees. Issued pt  new exercises to add to her HEP.     Rehab Potential  Excellent    Clinical Impairments Affecting Rehab Potential  chemo begins on 06/09/17    PT Frequency  2x / week    PT Duration  6 weeks    PT Treatment/Interventions  ADLs/Self Care Home Management;Cryotherapy;Ultrasound;Gait training;Stair training;Functional mobility training;Therapeutic activities;Therapeutic exercise;Balance training;Patient/family education;Manual techniques;Passive range of motion;Taping    PT Next Visit Plan  ROM and gentle strengthening L Knee,  Consider using parallel bars,  Nustep/ bike for ROM  See if she can hip SLR 3 way both    PT Home Exercise Plan  quadsets, assisted heel slides,  knee flexion standing, clams shells, SLR     Consulted and Agree with Plan of Care  Patient       Patient will benefit from skilled therapeutic intervention in order to improve the following deficits and impairments:  Abnormal gait, Pain, Decreased activity tolerance, Difficulty walking, Decreased balance, Decreased range of motion, Decreased strength, Decreased mobility  Visit Diagnosis: Acute pain of left knee  Difficulty in walking, not elsewhere classified  Stiffness of left knee, not elsewhere classified     Problem List Patient Active Problem List   Diagnosis Date Noted  . Port-A-Cath in place 06/09/2017  . Malignant neoplasm of upper-inner quadrant of left breast in female, estrogen receptor positive ([place]) 05/22/2017  . Closed fracture of left tibial plateau   . Axillary lymphadenopathy 04/12/2017  . Closed fracture of lateral portion of left tibial plateau 04/11/2017  . Chest pain, neg MI, neg stress Nuc study, possible GI 05/19/2014  . Essential hypertension 05/19/2014  . DM (diabetes mellitus) ([place]) 05/19/2014    Pt was seen for initial evaluation and never returned for follow up visits. Pt is being  discharged from outpt PT services.  PHYSICAL THERAPY DISCHARGE SUMMARY  Visits from Start of Care:  1  Current functional level related to goals / functional outcomes: See above   Remaining deficits: See above   Education / Equipment: See above Plan: Patient agrees to discharge.  Patient goals were not met. Patient is being discharged due to not returning since the last visit.  ?????    Kearney Hard, PT 11/13/17 1:26 PM     Oretha Caprice , MPT 06/29/2017, 11:42 AM  [facility] [address] [place], [place], [address] Phone: [phone]   Fax:  [phone]  Name: Amanda Duarte MRN: [id number] Date of Birth: 17-Sep-1955

## 2017-07-03 ENCOUNTER — Telehealth: Payer: Self-pay | Admitting: Hematology

## 2017-07-03 NOTE — Telephone Encounter (Signed)
R/s  Injection appt per Su Grand - inj needs to be 24 hours after treatment - treatment area closed when injection is needed. - daughter aware of appt change.
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
## 2017-07-07 ENCOUNTER — Inpatient Hospital Stay: Payer: 59

## 2017-07-07 ENCOUNTER — Inpatient Hospital Stay (HOSPITAL_BASED_OUTPATIENT_CLINIC_OR_DEPARTMENT_OTHER): Payer: 59 | Admitting: Hematology

## 2017-07-07 ENCOUNTER — Encounter: Payer: Self-pay | Admitting: Hematology

## 2017-07-07 ENCOUNTER — Encounter: Payer: Self-pay | Admitting: *Deleted

## 2017-07-07 VITALS — BP 136/66 | HR 66 | Temp 98.6°F | Resp 17 | Ht 59.0 in | Wt 129.5 lb

## 2017-07-07 DIAGNOSIS — R530 Neoplastic (malignant) related fatigue: Secondary | ICD-10-CM

## 2017-07-07 DIAGNOSIS — C50212 Malignant neoplasm of upper-inner quadrant of left female breast: Secondary | ICD-10-CM

## 2017-07-07 DIAGNOSIS — Z8781 Personal history of (healed) traumatic fracture: Secondary | ICD-10-CM

## 2017-07-07 DIAGNOSIS — Z95828 Presence of other vascular implants and grafts: Secondary | ICD-10-CM

## 2017-07-07 DIAGNOSIS — Z170 Estrogen receptor positive status [ER+]: Secondary | ICD-10-CM | POA: Diagnosis not present

## 2017-07-07 DIAGNOSIS — I10 Essential (primary) hypertension: Secondary | ICD-10-CM

## 2017-07-07 DIAGNOSIS — L658 Other specified nonscarring hair loss: Secondary | ICD-10-CM

## 2017-07-07 DIAGNOSIS — Z7982 Long term (current) use of aspirin: Secondary | ICD-10-CM | POA: Diagnosis not present

## 2017-07-07 DIAGNOSIS — C773 Secondary and unspecified malignant neoplasm of axilla and upper limb lymph nodes: Secondary | ICD-10-CM | POA: Diagnosis not present

## 2017-07-07 DIAGNOSIS — R634 Abnormal weight loss: Secondary | ICD-10-CM | POA: Diagnosis not present

## 2017-07-07 DIAGNOSIS — E119 Type 2 diabetes mellitus without complications: Secondary | ICD-10-CM | POA: Diagnosis not present

## 2017-07-07 DIAGNOSIS — E118 Type 2 diabetes mellitus with unspecified complications: Secondary | ICD-10-CM

## 2017-07-07 LAB — CBC WITH DIFFERENTIAL (CANCER CENTER ONLY)
BASOS ABS: 0 10*3/uL (ref 0.0–0.1)
BASOS PCT: 0 %
EOS PCT: 1 %
Eosinophils Absolute: 0.1 10*3/uL (ref 0.0–0.5)
HCT: 36.7 % (ref 34.8–46.6)
HEMOGLOBIN: 12.4 g/dL (ref 11.6–15.9)
LYMPHS ABS: 1.5 10*3/uL (ref 0.9–3.3)
Lymphocytes Relative: 12 %
MCH: 31.3 pg (ref 25.1–34.0)
MCHC: 33.8 g/dL (ref 31.5–36.0)
MCV: 92.7 fL (ref 79.5–101.0)
Monocytes Absolute: 1 10*3/uL — ABNORMAL HIGH (ref 0.1–0.9)
Monocytes Relative: 9 %
NEUTROS PCT: 78 %
Neutro Abs: 9.6 10*3/uL — ABNORMAL HIGH (ref 1.5–6.5)
PLATELETS: 173 10*3/uL (ref 145–400)
RBC: 3.96 MIL/uL (ref 3.70–5.45)
RDW: 13.3 % (ref 11.2–14.5)
WBC: 12.2 10*3/uL — AB (ref 3.9–10.3)

## 2017-07-07 LAB — CMP (CANCER CENTER ONLY)
ALK PHOS: 157 U/L — AB (ref 40–150)
ALT: 9 U/L (ref 0–55)
AST: 18 U/L (ref 5–34)
Albumin: 4 g/dL (ref 3.5–5.0)
Anion gap: 12 — ABNORMAL HIGH (ref 3–11)
BUN: 5 mg/dL — AB (ref 7–26)
CALCIUM: 9.6 mg/dL (ref 8.4–10.4)
CHLORIDE: 101 mmol/L (ref 98–109)
CO2: 28 mmol/L (ref 22–29)
CREATININE: 0.68 mg/dL (ref 0.60–1.10)
Glucose, Bld: 174 mg/dL — ABNORMAL HIGH (ref 70–140)
Potassium: 3.8 mmol/L (ref 3.5–5.1)
SODIUM: 141 mmol/L (ref 136–145)
Total Bilirubin: <0.2 mg/dL — ABNORMAL LOW (ref 0.2–1.2)
Total Protein: 7.7 g/dL (ref 6.4–8.3)

## 2017-07-07 MED ORDER — FOSAPREPITANT DIMEGLUMINE INJECTION 150 MG
Freq: Once | INTRAVENOUS | Status: AC
  Administered 2017-07-07: 15:00:00 via INTRAVENOUS
  Filled 2017-07-07: qty 5

## 2017-07-07 MED ORDER — HEPARIN SOD (PORK) LOCK FLUSH 100 UNIT/ML IV SOLN
500.0000 [IU] | Freq: Once | INTRAVENOUS | Status: AC | PRN
  Administered 2017-07-07: 500 [IU]
  Filled 2017-07-07: qty 5

## 2017-07-07 MED ORDER — SODIUM CHLORIDE 0.9% FLUSH
10.0000 mL | INTRAVENOUS | Status: DC | PRN
  Administered 2017-07-07: 10 mL
  Filled 2017-07-07: qty 10

## 2017-07-07 MED ORDER — PALONOSETRON HCL INJECTION 0.25 MG/5ML
INTRAVENOUS | Status: AC
  Filled 2017-07-07: qty 5

## 2017-07-07 MED ORDER — SODIUM CHLORIDE 0.9 % IV SOLN
Freq: Once | INTRAVENOUS | Status: AC
  Administered 2017-07-07: 14:00:00 via INTRAVENOUS

## 2017-07-07 MED ORDER — DOXORUBICIN HCL CHEMO IV INJECTION 2 MG/ML
60.0000 mg/m2 | Freq: Once | INTRAVENOUS | Status: AC
  Administered 2017-07-07: 94 mg via INTRAVENOUS
  Filled 2017-07-07: qty 47

## 2017-07-07 MED ORDER — SODIUM CHLORIDE 0.9 % IV SOLN
600.0000 mg/m2 | Freq: Once | INTRAVENOUS | Status: AC
  Administered 2017-07-07: 940 mg via INTRAVENOUS
  Filled 2017-07-07: qty 47

## 2017-07-07 MED ORDER — PALONOSETRON HCL INJECTION 0.25 MG/5ML
0.2500 mg | Freq: Once | INTRAVENOUS | Status: AC
  Administered 2017-07-07: 0.25 mg via INTRAVENOUS

## 2017-07-07 NOTE — Patient Instructions (Signed)
[facility] Discharge Instructions for Patients Receiving Chemotherapy  Today you received the following chemotherapy agents adriamycin/cytoxan  To help prevent nausea and vomiting after your treatment, we encourage you to take your nausea medication as directed  If you develop nausea and vomiting that is not controlled by your nausea medication, call the clinic.   BELOW ARE SYMPTOMS THAT SHOULD BE REPORTED IMMEDIATELY:  *FEVER GREATER THAN 100.5 F  *CHILLS WITH OR WITHOUT FEVER  NAUSEA AND VOMITING THAT IS NOT CONTROLLED WITH YOUR NAUSEA MEDICATION  *UNUSUAL SHORTNESS OF BREATH  *UNUSUAL BRUISING OR BLEEDING  TENDERNESS IN MOUTH AND THROAT WITH OR WITHOUT PRESENCE OF ULCERS  *URINARY PROBLEMS  *BOWEL PROBLEMS  UNUSUAL RASH Items with * indicate a potential emergency and should be followed up as soon as possible.  Feel free to call the clinic you have any questions or concerns. The clinic phone number is [phone].  

## 2017-07-08 ENCOUNTER — Ambulatory Visit: Payer: 59

## 2017-07-08 LAB — CANCER ANTIGEN 27.29: CA 27.29: 19.6 U/mL (ref 0.0–38.6)

## 2017-07-10 ENCOUNTER — Inpatient Hospital Stay: Payer: 59

## 2017-07-10 ENCOUNTER — Telehealth: Payer: Self-pay

## 2017-07-10 VITALS — BP 140/64 | HR 72 | Temp 97.9°F | Resp 76

## 2017-07-10 DIAGNOSIS — Z170 Estrogen receptor positive status [ER+]: Secondary | ICD-10-CM

## 2017-07-10 DIAGNOSIS — C50212 Malignant neoplasm of upper-inner quadrant of left female breast: Secondary | ICD-10-CM

## 2017-07-10 MED ORDER — PEGFILGRASTIM-CBQV 6 MG/0.6ML ~~LOC~~ SOSY
6.0000 mg | PREFILLED_SYRINGE | Freq: Once | SUBCUTANEOUS | Status: AC
Start: 2017-07-10 — End: 2017-07-10
  Administered 2017-07-10: 6 mg via SUBCUTANEOUS

## 2017-07-10 MED ORDER — PEGFILGRASTIM-CBQV 6 MG/0.6ML ~~LOC~~ SOSY
PREFILLED_SYRINGE | SUBCUTANEOUS | Status: AC
  Filled 2017-07-10: qty 0.6

## 2017-07-10 NOTE — Telephone Encounter (Signed)
After speaking with Burr Medico on Friday to verify her dates. It was confirmed by [name] to [name]. Also added the 6 week and No injections was added due to New treatment type

## 2017-07-10 NOTE — Patient Instructions (Signed)
Pegfilgrastim injection What is this medicine? PEGFILGRASTIM (PEG fil gra stim) is a long-acting granulocyte colony-stimulating factor that stimulates the growth of neutrophils, a type of white blood cell important in the body's fight against infection. It is used to reduce the incidence of fever and infection in patients with certain types of cancer who are receiving chemotherapy that affects the bone marrow, and to increase survival after being exposed to high doses of radiation. This medicine may be used for other purposes; ask your health care provider or pharmacist if you have questions. COMMON BRAND NAME(S): Neulasta What should I tell my health care provider before I take this medicine? They need to know if you have any of these conditions: -kidney disease -latex allergy -ongoing radiation therapy -sickle cell disease -skin reactions to acrylic adhesives (On-Body Injector only) -an unusual or allergic reaction to pegfilgrastim, filgrastim, other medicines, foods, dyes, or preservatives -pregnant or trying to get pregnant -breast-feeding How should I use this medicine? This medicine is for injection under the skin. If you get this medicine at home, you will be taught how to prepare and give the pre-filled syringe or how to use the On-body Injector. Refer to the patient Instructions for Use for detailed instructions. Use exactly as directed. Tell your healthcare provider immediately if you suspect that the On-body Injector may not have performed as intended or if you suspect the use of the On-body Injector resulted in a missed or partial dose. It is important that you put your used needles and syringes in a special sharps container. Do not put them in a trash can. If you do not have a sharps container, call your pharmacist or healthcare provider to get one. Talk to your pediatrician regarding the use of this medicine in children. While this drug may be prescribed for selected conditions,  precautions do apply. Overdosage: If you think you have taken too much of this medicine contact a poison control center or emergency room at once. NOTE: This medicine is only for you. Do not share this medicine with others. What if I miss a dose? It is important not to miss your dose. Call your doctor or health care professional if you miss your dose. If you miss a dose due to an On-body Injector failure or leakage, a new dose should be administered as soon as possible using a single prefilled syringe for manual use. What may interact with this medicine? Interactions have not been studied. Give your health care provider a list of all the medicines, herbs, non-prescription drugs, or dietary supplements you use. Also tell them if you smoke, drink alcohol, or use illegal drugs. Some items may interact with your medicine. This list may not describe all possible interactions. Give your health care provider a list of all the medicines, herbs, non-prescription drugs, or dietary supplements you use. Also tell them if you smoke, drink alcohol, or use illegal drugs. Some items may interact with your medicine. What should I watch for while using this medicine? You may need blood work done while you are taking this medicine. If you are going to need a MRI, CT scan, or other procedure, tell your doctor that you are using this medicine (On-Body Injector only). What side effects may I notice from receiving this medicine? Side effects that you should report to your doctor or health care professional as soon as possible: -allergic reactions like skin rash, itching or hives, swelling of the face, lips, or tongue -dizziness -fever -pain, redness, or irritation at site   where injected -pinpoint red spots on the skin -red or dark-brown urine -shortness of breath or breathing problems -stomach or side pain, or pain at the shoulder -swelling -tiredness -trouble passing urine or change in the amount of urine Side  effects that usually do not require medical attention (report to your doctor or health care professional if they continue or are bothersome): -bone pain -muscle pain This list may not describe all possible side effects. Call your doctor for medical advice about side effects. You may report side effects to FDA at 1-800-FDA-1088. Where should I keep my medicine? Keep out of the reach of children. Store pre-filled syringes in a refrigerator between 2 and 8 degrees C (36 and 46 degrees F). Do not freeze. Keep in carton to protect from light. Throw away this medicine if it is left out of the refrigerator for more than 48 hours. Throw away any unused medicine after the expiration date. NOTE: This sheet is a summary. It may not cover all possible information. If you have questions about this medicine, talk to your doctor, pharmacist, or health care provider.  2018 Elsevier/Gold Standard (2016-01-07 12:58:03)  
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
## 2017-07-19 ENCOUNTER — Other Ambulatory Visit: Payer: 59

## 2017-07-19 ENCOUNTER — Ambulatory Visit: Payer: 59 | Admitting: Hematology

## 2017-07-19 ENCOUNTER — Ambulatory Visit: Payer: 59

## 2017-07-21 ENCOUNTER — Inpatient Hospital Stay: Payer: 59

## 2017-07-21 ENCOUNTER — Ambulatory Visit: Payer: 59 | Admitting: Hematology

## 2017-07-21 ENCOUNTER — Encounter: Payer: Self-pay | Admitting: Hematology

## 2017-07-21 ENCOUNTER — Inpatient Hospital Stay (HOSPITAL_BASED_OUTPATIENT_CLINIC_OR_DEPARTMENT_OTHER): Payer: 59 | Admitting: Hematology

## 2017-07-21 VITALS — BP 136/68 | HR 70 | Temp 98.7°F | Resp 17 | Ht 59.0 in | Wt 129.9 lb

## 2017-07-21 DIAGNOSIS — M79606 Pain in leg, unspecified: Secondary | ICD-10-CM

## 2017-07-21 DIAGNOSIS — Z8781 Personal history of (healed) traumatic fracture: Secondary | ICD-10-CM

## 2017-07-21 DIAGNOSIS — M542 Cervicalgia: Secondary | ICD-10-CM

## 2017-07-21 DIAGNOSIS — T451X5A Adverse effect of antineoplastic and immunosuppressive drugs, initial encounter: Secondary | ICD-10-CM

## 2017-07-21 DIAGNOSIS — C773 Secondary and unspecified malignant neoplasm of axilla and upper limb lymph nodes: Secondary | ICD-10-CM

## 2017-07-21 DIAGNOSIS — I10 Essential (primary) hypertension: Secondary | ICD-10-CM

## 2017-07-21 DIAGNOSIS — Z170 Estrogen receptor positive status [ER+]: Secondary | ICD-10-CM

## 2017-07-21 DIAGNOSIS — C50212 Malignant neoplasm of upper-inner quadrant of left female breast: Secondary | ICD-10-CM

## 2017-07-21 DIAGNOSIS — Z95828 Presence of other vascular implants and grafts: Secondary | ICD-10-CM

## 2017-07-21 DIAGNOSIS — E119 Type 2 diabetes mellitus without complications: Secondary | ICD-10-CM

## 2017-07-21 DIAGNOSIS — D6481 Anemia due to antineoplastic chemotherapy: Secondary | ICD-10-CM

## 2017-07-21 DIAGNOSIS — M25511 Pain in right shoulder: Secondary | ICD-10-CM

## 2017-07-21 LAB — CBC WITH DIFFERENTIAL (CANCER CENTER ONLY)
BASOS PCT: 1 %
Basophils Absolute: 0.1 10*3/uL (ref 0.0–0.1)
EOS ABS: 0.2 10*3/uL (ref 0.0–0.5)
Eosinophils Relative: 2 %
HCT: 33.1 % — ABNORMAL LOW (ref 34.8–46.6)
HEMOGLOBIN: 11.2 g/dL — AB (ref 11.6–15.9)
LYMPHS ABS: 1.2 10*3/uL (ref 0.9–3.3)
Lymphocytes Relative: 9 %
MCH: 31.3 pg (ref 25.1–34.0)
MCHC: 33.9 g/dL (ref 31.5–36.0)
MCV: 92.5 fL (ref 79.5–101.0)
MONO ABS: 1 10*3/uL — AB (ref 0.1–0.9)
MONOS PCT: 8 %
Neutro Abs: 10.2 10*3/uL — ABNORMAL HIGH (ref 1.5–6.5)
Neutrophils Relative %: 80 %
Platelet Count: 218 10*3/uL (ref 145–400)
RBC: 3.58 MIL/uL — ABNORMAL LOW (ref 3.70–5.45)
RDW: 13.9 % (ref 11.2–14.5)
WBC Count: 12.7 10*3/uL — ABNORMAL HIGH (ref 3.9–10.3)

## 2017-07-21 LAB — COMPREHENSIVE METABOLIC PANEL
ALBUMIN: 3.9 g/dL (ref 3.5–5.0)
ALK PHOS: 143 U/L — AB (ref 38–126)
ALT: 15 U/L (ref 0–44)
AST: 27 U/L (ref 15–41)
Anion gap: 10 (ref 5–15)
BUN: 7 mg/dL — AB (ref 8–23)
CALCIUM: 9.3 mg/dL (ref 8.9–10.3)
CO2: 29 mmol/L (ref 22–32)
CREATININE: 0.54 mg/dL (ref 0.44–1.00)
Chloride: 104 mmol/L (ref 98–111)
GFR calc Af Amer: >60 mL/min (ref >60)
GFR calc non Af Amer: >60 mL/min (ref >60)
Glucose, Bld: 172 mg/dL — ABNORMAL HIGH (ref 70–99)
Potassium: 3.4 mmol/L — ABNORMAL LOW (ref 3.5–5.1)
SODIUM: 143 mmol/L (ref 135–145)
Total Bilirubin: 0.2 mg/dL — ABNORMAL LOW (ref 0.3–1.2)
Total Protein: 7.1 g/dL (ref 6.5–8.1)

## 2017-07-21 MED ORDER — DOXORUBICIN HCL CHEMO IV INJECTION 2 MG/ML
60.0000 mg/m2 | Freq: Once | INTRAVENOUS | Status: AC
  Administered 2017-07-21: 94 mg via INTRAVENOUS
  Filled 2017-07-21: qty 47

## 2017-07-21 MED ORDER — SODIUM CHLORIDE 0.9 % IV SOLN
600.0000 mg/m2 | Freq: Once | INTRAVENOUS | Status: AC
  Administered 2017-07-21: 940 mg via INTRAVENOUS
  Filled 2017-07-21: qty 47

## 2017-07-21 MED ORDER — HEPARIN SOD (PORK) LOCK FLUSH 100 UNIT/ML IV SOLN
500.0000 [IU] | Freq: Once | INTRAVENOUS | Status: AC | PRN
  Administered 2017-07-21: 500 [IU]
  Filled 2017-07-21: qty 5

## 2017-07-21 MED ORDER — SODIUM CHLORIDE 0.9% FLUSH
10.0000 mL | INTRAVENOUS | Status: DC | PRN
  Administered 2017-07-21: 10 mL
  Filled 2017-07-21: qty 10

## 2017-07-21 MED ORDER — PALONOSETRON HCL INJECTION 0.25 MG/5ML
0.2500 mg | Freq: Once | INTRAVENOUS | Status: AC
  Administered 2017-07-21: 0.25 mg via INTRAVENOUS

## 2017-07-21 MED ORDER — SODIUM CHLORIDE 0.9 % IV SOLN
Freq: Once | INTRAVENOUS | Status: AC
  Administered 2017-07-21: 11:00:00 via INTRAVENOUS
  Filled 2017-07-21: qty 5

## 2017-07-21 MED ORDER — SODIUM CHLORIDE 0.9 % IV SOLN
Freq: Once | INTRAVENOUS | Status: AC
  Administered 2017-07-21: 10:00:00 via INTRAVENOUS

## 2017-07-21 MED ORDER — PALONOSETRON HCL INJECTION 0.25 MG/5ML
INTRAVENOUS | Status: AC
  Filled 2017-07-21: qty 5

## 2017-07-21 NOTE — Patient Instructions (Signed)
[facility] Discharge Instructions for Patients Receiving Chemotherapy  Today you received the following chemotherapy agents Adriamycin,cytoxan  To help prevent nausea and vomiting after your treatment, we encourage you to take your nausea medication as dirercted  If you develop nausea and vomiting that is not controlled by your nausea medication, call the clinic.   BELOW ARE SYMPTOMS THAT SHOULD BE REPORTED IMMEDIATELY:  *FEVER GREATER THAN 100.5 F  *CHILLS WITH OR WITHOUT FEVER  NAUSEA AND VOMITING THAT IS NOT CONTROLLED WITH YOUR NAUSEA MEDICATION  *UNUSUAL SHORTNESS OF BREATH  *UNUSUAL BRUISING OR BLEEDING  TENDERNESS IN MOUTH AND THROAT WITH OR WITHOUT PRESENCE OF ULCERS  *URINARY PROBLEMS  *BOWEL PROBLEMS  UNUSUAL RASH Items with * indicate a potential emergency and should be followed up as soon as possible.  Feel free to call the clinic should you have any questions or concerns. The clinic phone number is [phone].  Please show the [facility] at check-in to the Emergency Department and triage nurse.

## 2017-07-22 ENCOUNTER — Inpatient Hospital Stay: Payer: 59

## 2017-07-22 VITALS — BP 111/57 | HR 71 | Temp 98.5°F | Resp 16

## 2017-07-22 DIAGNOSIS — Z170 Estrogen receptor positive status [ER+]: Secondary | ICD-10-CM

## 2017-07-22 DIAGNOSIS — C50212 Malignant neoplasm of upper-inner quadrant of left female breast: Secondary | ICD-10-CM

## 2017-07-22 MED ORDER — PEGFILGRASTIM-CBQV 6 MG/0.6ML ~~LOC~~ SOSY
6.0000 mg | PREFILLED_SYRINGE | Freq: Once | SUBCUTANEOUS | Status: AC
  Administered 2017-07-22: 6 mg via SUBCUTANEOUS

## 2017-07-22 MED ORDER — PEGFILGRASTIM-CBQV 6 MG/0.6ML ~~LOC~~ SOSY
PREFILLED_SYRINGE | SUBCUTANEOUS | Status: AC
  Filled 2017-07-22: qty 0.6

## 2017-07-22 NOTE — Patient Instructions (Signed)
Pegfilgrastim injection What is this medicine? PEGFILGRASTIM (PEG fil gra stim) is a long-acting granulocyte colony-stimulating factor that stimulates the growth of neutrophils, a type of white blood cell important in the body's fight against infection. It is used to reduce the incidence of fever and infection in patients with certain types of cancer who are receiving chemotherapy that affects the bone marrow, and to increase survival after being exposed to high doses of radiation. This medicine may be used for other purposes; ask your health care provider or pharmacist if you have questions. COMMON BRAND NAME(S): Neulasta What should I tell my health care provider before I take this medicine? They need to know if you have any of these conditions: -kidney disease -latex allergy -ongoing radiation therapy -sickle cell disease -skin reactions to acrylic adhesives (On-Body Injector only) -an unusual or allergic reaction to pegfilgrastim, filgrastim, other medicines, foods, dyes, or preservatives -pregnant or trying to get pregnant -breast-feeding How should I use this medicine? This medicine is for injection under the skin. If you get this medicine at home, you will be taught how to prepare and give the pre-filled syringe or how to use the On-body Injector. Refer to the patient Instructions for Use for detailed instructions. Use exactly as directed. Tell your healthcare provider immediately if you suspect that the On-body Injector may not have performed as intended or if you suspect the use of the On-body Injector resulted in a missed or partial dose. It is important that you put your used needles and syringes in a special sharps container. Do not put them in a trash can. If you do not have a sharps container, call your pharmacist or healthcare provider to get one. Talk to your pediatrician regarding the use of this medicine in children. While this drug may be prescribed for selected conditions,  precautions do apply. Overdosage: If you think you have taken too much of this medicine contact a poison control center or emergency room at once. NOTE: This medicine is only for you. Do not share this medicine with others. What if I miss a dose? It is important not to miss your dose. Call your doctor or health care professional if you miss your dose. If you miss a dose due to an On-body Injector failure or leakage, a new dose should be administered as soon as possible using a single prefilled syringe for manual use. What may interact with this medicine? Interactions have not been studied. Give your health care provider a list of all the medicines, herbs, non-prescription drugs, or dietary supplements you use. Also tell them if you smoke, drink alcohol, or use illegal drugs. Some items may interact with your medicine. This list may not describe all possible interactions. Give your health care provider a list of all the medicines, herbs, non-prescription drugs, or dietary supplements you use. Also tell them if you smoke, drink alcohol, or use illegal drugs. Some items may interact with your medicine. What should I watch for while using this medicine? You may need blood work done while you are taking this medicine. If you are going to need a MRI, CT scan, or other procedure, tell your doctor that you are using this medicine (On-Body Injector only). What side effects may I notice from receiving this medicine? Side effects that you should report to your doctor or health care professional as soon as possible: -allergic reactions like skin rash, itching or hives, swelling of the face, lips, or tongue -dizziness -fever -pain, redness, or irritation at site   where injected -pinpoint red spots on the skin -red or dark-brown urine -shortness of breath or breathing problems -stomach or side pain, or pain at the shoulder -swelling -tiredness -trouble passing urine or change in the amount of urine Side  effects that usually do not require medical attention (report to your doctor or health care professional if they continue or are bothersome): -bone pain -muscle pain This list may not describe all possible side effects. Call your doctor for medical advice about side effects. You may report side effects to FDA at 1-800-FDA-1088. Where should I keep my medicine? Keep out of the reach of children. Store pre-filled syringes in a refrigerator between 2 and 8 degrees C (36 and 46 degrees F). Do not freeze. Keep in carton to protect from light. Throw away this medicine if it is left out of the refrigerator for more than 48 hours. Throw away any unused medicine after the expiration date. NOTE: This sheet is a summary. It may not cover all possible information. If you have questions about this medicine, talk to your doctor, pharmacist, or health care provider.  2018 Elsevier/Gold Standard (2016-01-07 12:58:03)  

## 2017-08-02 ENCOUNTER — Telehealth: Payer: Self-pay

## 2017-08-02 NOTE — Telephone Encounter (Signed)
Amanda Duarte in (translation) came and translated a voice mail to patient concerning the change in her appointment. Per 7/10 los
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
## 2017-08-04 ENCOUNTER — Inpatient Hospital Stay: Payer: Self-pay

## 2017-08-04 ENCOUNTER — Ambulatory Visit: Payer: 59 | Admitting: Nurse Practitioner

## 2017-08-04 ENCOUNTER — Encounter: Payer: Self-pay | Admitting: Hematology

## 2017-08-04 ENCOUNTER — Inpatient Hospital Stay: Payer: Self-pay | Attending: Nurse Practitioner

## 2017-08-04 ENCOUNTER — Inpatient Hospital Stay (HOSPITAL_BASED_OUTPATIENT_CLINIC_OR_DEPARTMENT_OTHER): Payer: Self-pay | Admitting: Hematology

## 2017-08-04 ENCOUNTER — Telehealth: Payer: Self-pay | Admitting: Hematology

## 2017-08-04 VITALS — BP 143/62 | HR 57 | Temp 98.4°F | Resp 17

## 2017-08-04 VITALS — BP 145/58 | HR 68 | Temp 98.0°F | Resp 18 | Ht 59.0 in | Wt 126.7 lb

## 2017-08-04 DIAGNOSIS — C773 Secondary and unspecified malignant neoplasm of axilla and upper limb lymph nodes: Secondary | ICD-10-CM | POA: Insufficient documentation

## 2017-08-04 DIAGNOSIS — Z170 Estrogen receptor positive status [ER+]: Secondary | ICD-10-CM

## 2017-08-04 DIAGNOSIS — D6481 Anemia due to antineoplastic chemotherapy: Secondary | ICD-10-CM

## 2017-08-04 DIAGNOSIS — E118 Type 2 diabetes mellitus with unspecified complications: Secondary | ICD-10-CM

## 2017-08-04 DIAGNOSIS — Z5111 Encounter for antineoplastic chemotherapy: Secondary | ICD-10-CM | POA: Insufficient documentation

## 2017-08-04 DIAGNOSIS — T451X5A Adverse effect of antineoplastic and immunosuppressive drugs, initial encounter: Secondary | ICD-10-CM

## 2017-08-04 DIAGNOSIS — I10 Essential (primary) hypertension: Secondary | ICD-10-CM

## 2017-08-04 DIAGNOSIS — Z452 Encounter for adjustment and management of vascular access device: Secondary | ICD-10-CM | POA: Insufficient documentation

## 2017-08-04 DIAGNOSIS — Z95828 Presence of other vascular implants and grafts: Secondary | ICD-10-CM

## 2017-08-04 DIAGNOSIS — C50212 Malignant neoplasm of upper-inner quadrant of left female breast: Secondary | ICD-10-CM

## 2017-08-04 DIAGNOSIS — Z8781 Personal history of (healed) traumatic fracture: Secondary | ICD-10-CM

## 2017-08-04 DIAGNOSIS — E119 Type 2 diabetes mellitus without complications: Secondary | ICD-10-CM

## 2017-08-04 LAB — CMP (CANCER CENTER ONLY)
ALBUMIN: 3.7 g/dL (ref 3.5–5.0)
ALK PHOS: 129 U/L — AB (ref 38–126)
ALT: 13 U/L (ref 0–44)
AST: 20 U/L (ref 15–41)
Anion gap: 10 (ref 5–15)
BILIRUBIN TOTAL: 0.3 mg/dL (ref 0.3–1.2)
BUN: 9 mg/dL (ref 8–23)
CO2: 29 mmol/L (ref 22–32)
Calcium: 9.2 mg/dL (ref 8.9–10.3)
Chloride: 103 mmol/L (ref 98–111)
Creatinine: 0.84 mg/dL (ref 0.44–1.00)
GFR, Est AFR Am: >60 mL/min (ref >60)
GFR, Estimated: >60 mL/min (ref >60)
Glucose, Bld: 173 mg/dL — ABNORMAL HIGH (ref 70–99)
POTASSIUM: 3.3 mmol/L — AB (ref 3.5–5.1)
SODIUM: 142 mmol/L (ref 135–145)
TOTAL PROTEIN: 6.9 g/dL (ref 6.5–8.1)

## 2017-08-04 LAB — CBC WITH DIFFERENTIAL (CANCER CENTER ONLY)
Basophils Absolute: 0 10*3/uL (ref 0.0–0.1)
Basophils Relative: 0 %
EOS PCT: 2 %
Eosinophils Absolute: 0.1 10*3/uL (ref 0.0–0.5)
HEMATOCRIT: 29.9 % — AB (ref 34.8–46.6)
HEMOGLOBIN: 10.1 g/dL — AB (ref 11.6–15.9)
LYMPHS PCT: 15 %
Lymphs Abs: 1 10*3/uL (ref 0.9–3.3)
MCH: 31.7 pg (ref 25.1–34.0)
MCHC: 33.8 g/dL (ref 31.5–36.0)
MCV: 93.7 fL (ref 79.5–101.0)
MONOS PCT: 11 %
Monocytes Absolute: 0.7 10*3/uL (ref 0.1–0.9)
Neutro Abs: 4.8 10*3/uL (ref 1.5–6.5)
Neutrophils Relative %: 72 %
Platelet Count: 174 10*3/uL (ref 145–400)
RBC: 3.19 MIL/uL — AB (ref 3.70–5.45)
RDW: 15.9 % — ABNORMAL HIGH (ref 11.2–14.5)
WBC Count: 6.7 10*3/uL (ref 3.9–10.3)

## 2017-08-04 MED ORDER — SODIUM CHLORIDE 0.9 % IV SOLN
179.4000 mg | Freq: Once | INTRAVENOUS | Status: AC
  Administered 2017-08-04: 180 mg via INTRAVENOUS
  Filled 2017-08-04: qty 18

## 2017-08-04 MED ORDER — SODIUM CHLORIDE 0.9% FLUSH
10.0000 mL | INTRAVENOUS | Status: DC | PRN
  Administered 2017-08-04: 10 mL
  Filled 2017-08-04: qty 10

## 2017-08-04 MED ORDER — DIPHENHYDRAMINE HCL 50 MG/ML IJ SOLN
50.0000 mg | Freq: Once | INTRAMUSCULAR | Status: AC
  Administered 2017-08-04: 50 mg via INTRAVENOUS

## 2017-08-04 MED ORDER — FAMOTIDINE IN NACL 20-0.9 MG/50ML-% IV SOLN
INTRAVENOUS | Status: AC
  Filled 2017-08-04: qty 50

## 2017-08-04 MED ORDER — DIPHENHYDRAMINE HCL 50 MG/ML IJ SOLN
INTRAMUSCULAR | Status: AC
  Filled 2017-08-04: qty 1

## 2017-08-04 MED ORDER — FAMOTIDINE IN NACL 20-0.9 MG/50ML-% IV SOLN
20.0000 mg | Freq: Once | INTRAVENOUS | Status: AC
  Administered 2017-08-04: 20 mg via INTRAVENOUS

## 2017-08-04 MED ORDER — SODIUM CHLORIDE 0.9 % IV SOLN
80.0000 mg/m2 | Freq: Once | INTRAVENOUS | Status: AC
  Administered 2017-08-04: 126 mg via INTRAVENOUS
  Filled 2017-08-04: qty 21

## 2017-08-04 MED ORDER — SODIUM CHLORIDE 0.9 % IV SOLN
20.0000 mg | Freq: Once | INTRAVENOUS | Status: AC
  Administered 2017-08-04: 20 mg via INTRAVENOUS
  Filled 2017-08-04: qty 2

## 2017-08-04 MED ORDER — HEPARIN SOD (PORK) LOCK FLUSH 100 UNIT/ML IV SOLN
500.0000 [IU] | Freq: Once | INTRAVENOUS | Status: AC | PRN
  Administered 2017-08-04: 500 [IU]
  Filled 2017-08-04: qty 5

## 2017-08-04 MED ORDER — SODIUM CHLORIDE 0.9 % IV SOLN
Freq: Once | INTRAVENOUS | Status: AC
Start: 2017-08-04 — End: 2017-08-04
  Administered 2017-08-04: 13:00:00 via INTRAVENOUS

## 2017-08-04 NOTE — Patient Instructions (Addendum)
[facility] Discharge Instructions for Patients Receiving Chemotherapy  Today you received the following chemotherapy agents Carboplatin, Taxol To help prevent nausea and vomiting after your treatment, we encourage you to take your nausea medication as directed   If you develop nausea and vomiting that is not controlled by your nausea medication, call the clinic.   BELOW ARE SYMPTOMS THAT SHOULD BE REPORTED IMMEDIATELY:  *FEVER GREATER THAN 100.5 F  *CHILLS WITH OR WITHOUT FEVER  NAUSEA AND VOMITING THAT IS NOT CONTROLLED WITH YOUR NAUSEA MEDICATION  *UNUSUAL SHORTNESS OF BREATH  *UNUSUAL BRUISING OR BLEEDING  TENDERNESS IN MOUTH AND THROAT WITH OR WITHOUT PRESENCE OF ULCERS  *URINARY PROBLEMS  *BOWEL PROBLEMS  UNUSUAL RASH Items with * indicate a potential emergency and should be followed up as soon as possible.  Feel free to call the clinic should you have any questions or concerns. The clinic phone number is [phone].  Please show the CHEMO ALERT CARD at check-in to the Emergency Department and triage nurse.    Paclitaxel injection What is this medicine? PACLITAXEL (PAK li TAX el) is a chemotherapy drug. It targets fast dividing cells, like cancer cells, and causes these cells to die. This medicine is used to treat ovarian cancer, breast cancer, and other cancers. This medicine may be used for other purposes; ask your health care provider or pharmacist if you have questions. COMMON BRAND NAME(S): Onxol, Taxol What should I tell my health care provider before I take this medicine? They need to know if you have any of these conditions: -blood disorders -irregular heartbeat -infection (especially a virus infection such as chickenpox, cold sores, or herpes) -liver disease -previous or ongoing radiation therapy -an unusual or allergic reaction to paclitaxel, alcohol, polyoxyethylated castor oil, other chemotherapy agents, other medicines, foods, dyes,  or preservatives -pregnant or trying to get pregnant -breast-feeding How should I use this medicine? This drug is given as an infusion into a vein. It is administered in a hospital or clinic by a specially trained health care professional. Talk to your pediatrician regarding the use of this medicine in children. Special care may be needed. Overdosage: If you think you have taken too much of this medicine contact a poison control center or emergency room at once. NOTE: This medicine is only for you. Do not share this medicine with others. What if I miss a dose? It is important not to miss your dose. Call your doctor or health care professional if you are unable to keep an appointment. What may interact with this medicine? Do not take this medicine with any of the following medications: -disulfiram -metronidazole This medicine may also interact with the following medications: -cyclosporine -diazepam -ketoconazole -medicines to increase blood counts like filgrastim, pegfilgrastim, sargramostim -other chemotherapy drugs like cisplatin, doxorubicin, epirubicin, etoposide, teniposide, vincristine -quinidine -testosterone -vaccines -verapamil Talk to your doctor or health care professional before taking any of these medicines: -acetaminophen -aspirin -ibuprofen -ketoprofen -naproxen This list may not describe all possible interactions. Give your health care provider a list of all the medicines, herbs, non-prescription drugs, or dietary supplements you use. Also tell them if you smoke, drink alcohol, or use illegal drugs. Some items may interact with your medicine. What should I watch for while using this medicine? Your condition will be monitored carefully while you are receiving this medicine. You will need important blood work done while you are taking this medicine. This medicine can cause serious allergic reactions. To reduce your risk you will need   to take other medicine(s) before  treatment with this medicine. If you experience allergic reactions like skin rash, itching or hives, swelling of the face, lips, or tongue, tell your doctor or health care professional right away. In some cases, you may be given additional medicines to help with side effects. Follow all directions for their use. This drug may make you feel generally unwell. This is not uncommon, as chemotherapy can affect healthy cells as well as cancer cells. Report any side effects. Continue your course of treatment even though you feel ill unless your doctor tells you to stop. Call your doctor or health care professional for advice if you get a fever, chills or sore throat, or other symptoms of a cold or flu. Do not treat yourself. This drug decreases your body's ability to fight infections. Try to avoid being around people who are sick. This medicine may increase your risk to bruise or bleed. Call your doctor or health care professional if you notice any unusual bleeding. Be careful brushing and flossing your teeth or using a toothpick because you may get an infection or bleed more easily. If you have any dental work done, tell your dentist you are receiving this medicine. Avoid taking products that contain aspirin, acetaminophen, ibuprofen, naproxen, or ketoprofen unless instructed by your doctor. These medicines may hide a fever. Do not become pregnant while taking this medicine. Women should inform their doctor if they wish to become pregnant or think they might be pregnant. There is a potential for serious side effects to an unborn child. Talk to your health care professional or pharmacist for more information. Do not breast-feed an infant while taking this medicine. Men are advised not to father a child while receiving this medicine. This product may contain alcohol. Ask your pharmacist or healthcare provider if this medicine contains alcohol. Be sure to tell all healthcare providers you are taking this medicine.  Certain medicines, like metronidazole and disulfiram, can cause an unpleasant reaction when taken with alcohol. The reaction includes flushing, headache, nausea, vomiting, sweating, and increased thirst. The reaction can last from 30 minutes to several hours. What side effects may I notice from receiving this medicine? Side effects that you should report to your doctor or health care professional as soon as possible: -allergic reactions like skin rash, itching or hives, swelling of the face, lips, or tongue -low blood counts - This drug may decrease the number of white blood cells, red blood cells and platelets. You may be at increased risk for infections and bleeding. -signs of infection - fever or chills, cough, sore throat, pain or difficulty passing urine -signs of decreased platelets or bleeding - bruising, pinpoint red spots on the skin, black, tarry stools, nosebleeds -signs of decreased red blood cells - unusually weak or tired, fainting spells, lightheadedness -breathing problems -chest pain -high or low blood pressure -mouth sores -nausea and vomiting -pain, swelling, redness or irritation at the injection site -pain, tingling, numbness in the hands or feet -slow or irregular heartbeat -swelling of the ankle, feet, hands Side effects that usually do not require medical attention (report to your doctor or health care professional if they continue or are bothersome): -bone pain -complete hair loss including hair on your head, underarms, pubic hair, eyebrows, and eyelashes -changes in the color of fingernails -diarrhea -loosening of the fingernails -loss of appetite -muscle or joint pain -red flush to skin -sweating This list may not describe all possible side effects. Call your doctor for medical advice   about side effects. You may report side effects to FDA at 1-800-FDA-1088. Where should I keep my medicine? This drug is given in a hospital or clinic and will not be stored at  home. NOTE: This sheet is a summary. It may not cover all possible information. If you have questions about this medicine, talk to your doctor, pharmacist, or health care provider.  2018 [employer] Standard (2014-11-11 19:58:00)    Carboplatin injection What is this medicine? CARBOPLATIN (KAR boe pla tin) is a chemotherapy drug. It targets fast dividing cells, like cancer cells, and causes these cells to die. This medicine is used to treat ovarian cancer and many other cancers. This medicine may be used for other purposes; ask your health care provider or pharmacist if you have questions. COMMON BRAND NAME(S): Paraplatin What should I tell my health care provider before I take this medicine? They need to know if you have any of these conditions: -blood disorders -hearing problems -kidney disease -recent or ongoing radiation therapy -an unusual or allergic reaction to carboplatin, cisplatin, other chemotherapy, other medicines, foods, dyes, or preservatives -pregnant or trying to get pregnant -breast-feeding How should I use this medicine? This drug is usually given as an infusion into a vein. It is administered in a hospital or clinic by a specially trained health care professional. Talk to your pediatrician regarding the use of this medicine in children. Special care may be needed. Overdosage: If you think you have taken too much of this medicine contact a poison control center or emergency room at once. NOTE: This medicine is only for you. Do not share this medicine with others. What if I miss a dose? It is important not to miss a dose. Call your doctor or health care professional if you are unable to keep an appointment. What may interact with this medicine? -medicines for seizures -medicines to increase blood counts like filgrastim, pegfilgrastim, sargramostim -some antibiotics like amikacin, gentamicin, neomycin, streptomycin, tobramycin -vaccines Talk to your doctor or  health care professional before taking any of these medicines: -acetaminophen -aspirin -ibuprofen -ketoprofen -naproxen This list may not describe all possible interactions. Give your health care provider a list of all the medicines, herbs, non-prescription drugs, or dietary supplements you use. Also tell them if you smoke, drink alcohol, or use illegal drugs. Some items may interact with your medicine. What should I watch for while using this medicine? Your condition will be monitored carefully while you are receiving this medicine. You will need important blood work done while you are taking this medicine. This drug may make you feel generally unwell. This is not uncommon, as chemotherapy can affect healthy cells as well as cancer cells. Report any side effects. Continue your course of treatment even though you feel ill unless your doctor tells you to stop. In some cases, you may be given additional medicines to help with side effects. Follow all directions for their use. Call your doctor or health care professional for advice if you get a fever, chills or sore throat, or other symptoms of a cold or flu. Do not treat yourself. This drug decreases your body's ability to fight infections. Try to avoid being around people who are sick. This medicine may increase your risk to bruise or bleed. Call your doctor or health care professional if you notice any unusual bleeding. Be careful brushing and flossing your teeth or using a toothpick because you may get an infection or bleed more easily. If you have any dental work done, tell   your dentist you are receiving this medicine. Avoid taking products that contain aspirin, acetaminophen, ibuprofen, naproxen, or ketoprofen unless instructed by your doctor. These medicines may hide a fever. Do not become pregnant while taking this medicine. Women should inform their doctor if they wish to become pregnant or think they might be pregnant. There is a potential for  serious side effects to an unborn child. Talk to your health care professional or pharmacist for more information. Do not breast-feed an infant while taking this medicine. What side effects may I notice from receiving this medicine? Side effects that you should report to your doctor or health care professional as soon as possible: -allergic reactions like skin rash, itching or hives, swelling of the face, lips, or tongue -signs of infection - fever or chills, cough, sore throat, pain or difficulty passing urine -signs of decreased platelets or bleeding - bruising, pinpoint red spots on the skin, black, tarry stools, nosebleeds -signs of decreased red blood cells - unusually weak or tired, fainting spells, lightheadedness -breathing problems -changes in hearing -changes in vision -chest pain -high blood pressure -low blood counts - This drug may decrease the number of white blood cells, red blood cells and platelets. You may be at increased risk for infections and bleeding. -nausea and vomiting -pain, swelling, redness or irritation at the injection site -pain, tingling, numbness in the hands or feet -problems with balance, talking, walking -trouble passing urine or change in the amount of urine Side effects that usually do not require medical attention (report to your doctor or health care professional if they continue or are bothersome): -hair loss -loss of appetite -metallic taste in the mouth or changes in taste This list may not describe all possible side effects. Call your doctor for medical advice about side effects. You may report side effects to FDA at 1-800-FDA-1088. Where should I keep my medicine? This drug is given in a hospital or clinic and will not be stored at home. NOTE: This sheet is a summary. It may not cover all possible information. If you have questions about this medicine, talk to your doctor, pharmacist, or health care provider.  2018 [employer] Standard  (2007-04-17 14:38:05).   

## 2017-08-04 NOTE — Telephone Encounter (Signed)
No LOS 7/12

## 2017-08-05 LAB — CANCER ANTIGEN 27.29: CAN 27.29: 13.9 U/mL (ref 0.0–38.6)
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
## 2017-08-11 ENCOUNTER — Other Ambulatory Visit: Payer: 59

## 2017-08-11 ENCOUNTER — Inpatient Hospital Stay (HOSPITAL_BASED_OUTPATIENT_CLINIC_OR_DEPARTMENT_OTHER): Payer: Self-pay | Admitting: Nurse Practitioner

## 2017-08-11 ENCOUNTER — Inpatient Hospital Stay: Payer: Self-pay

## 2017-08-11 ENCOUNTER — Encounter: Payer: Self-pay | Admitting: Nurse Practitioner

## 2017-08-11 VITALS — BP 134/51 | HR 72 | Temp 97.8°F | Resp 18 | Ht 59.0 in | Wt 127.9 lb

## 2017-08-11 DIAGNOSIS — Z170 Estrogen receptor positive status [ER+]: Secondary | ICD-10-CM

## 2017-08-11 DIAGNOSIS — I10 Essential (primary) hypertension: Secondary | ICD-10-CM

## 2017-08-11 DIAGNOSIS — Z8781 Personal history of (healed) traumatic fracture: Secondary | ICD-10-CM

## 2017-08-11 DIAGNOSIS — D6481 Anemia due to antineoplastic chemotherapy: Secondary | ICD-10-CM

## 2017-08-11 DIAGNOSIS — C50212 Malignant neoplasm of upper-inner quadrant of left female breast: Secondary | ICD-10-CM

## 2017-08-11 DIAGNOSIS — E118 Type 2 diabetes mellitus with unspecified complications: Secondary | ICD-10-CM

## 2017-08-11 DIAGNOSIS — E119 Type 2 diabetes mellitus without complications: Secondary | ICD-10-CM

## 2017-08-11 DIAGNOSIS — G629 Polyneuropathy, unspecified: Secondary | ICD-10-CM

## 2017-08-11 DIAGNOSIS — T451X5A Adverse effect of antineoplastic and immunosuppressive drugs, initial encounter: Secondary | ICD-10-CM

## 2017-08-11 DIAGNOSIS — Z95828 Presence of other vascular implants and grafts: Secondary | ICD-10-CM

## 2017-08-11 LAB — CMP (CANCER CENTER ONLY)
ALT: 17 U/L (ref 0–44)
AST: 23 U/L (ref 15–41)
Albumin: 3.7 g/dL (ref 3.5–5.0)
Alkaline Phosphatase: 99 U/L (ref 38–126)
Anion gap: 9 (ref 5–15)
BILIRUBIN TOTAL: 0.2 mg/dL — AB (ref 0.3–1.2)
BUN: 8 mg/dL (ref 8–23)
CO2: 27 mmol/L (ref 22–32)
CREATININE: 0.7 mg/dL (ref 0.44–1.00)
Calcium: 8.9 mg/dL (ref 8.9–10.3)
Chloride: 101 mmol/L (ref 98–111)
GFR, Est AFR Am: >60 mL/min (ref >60)
Glucose, Bld: 228 mg/dL — ABNORMAL HIGH (ref 70–99)
Potassium: 3.7 mmol/L (ref 3.5–5.1)
Sodium: 137 mmol/L (ref 135–145)
TOTAL PROTEIN: 7.3 g/dL (ref 6.5–8.1)

## 2017-08-11 LAB — CBC WITH DIFFERENTIAL (CANCER CENTER ONLY)
Basophils Absolute: 0 10*3/uL (ref 0.0–0.1)
Basophils Relative: 1 %
EOS PCT: 2 %
Eosinophils Absolute: 0.1 10*3/uL (ref 0.0–0.5)
HEMATOCRIT: 29.5 % — AB (ref 34.8–46.6)
HEMOGLOBIN: 10.2 g/dL — AB (ref 11.6–15.9)
Lymphocytes Relative: 16 %
Lymphs Abs: 0.8 10*3/uL — ABNORMAL LOW (ref 0.9–3.3)
MCH: 32.2 pg (ref 25.1–34.0)
MCHC: 34.7 g/dL (ref 31.5–36.0)
MCV: 93 fL (ref 79.5–101.0)
MONO ABS: 0.4 10*3/uL (ref 0.1–0.9)
Monocytes Relative: 8 %
Neutro Abs: 3.7 10*3/uL (ref 1.5–6.5)
Neutrophils Relative %: 73 %
Platelet Count: 314 10*3/uL (ref 145–400)
RBC: 3.17 MIL/uL — ABNORMAL LOW (ref 3.70–5.45)
RDW: 15.9 % — ABNORMAL HIGH (ref 11.2–14.5)
WBC: 5 10*3/uL (ref 3.9–10.3)

## 2017-08-11 MED ORDER — DIPHENHYDRAMINE HCL 50 MG/ML IJ SOLN
INTRAMUSCULAR | Status: AC
  Filled 2017-08-11: qty 1

## 2017-08-11 MED ORDER — FAMOTIDINE IN NACL 20-0.9 MG/50ML-% IV SOLN
INTRAVENOUS | Status: AC
  Filled 2017-08-11: qty 50

## 2017-08-11 MED ORDER — SODIUM CHLORIDE 0.9 % IV SOLN
80.0000 mg/m2 | Freq: Once | INTRAVENOUS | Status: AC
  Administered 2017-08-11: 126 mg via INTRAVENOUS
  Filled 2017-08-11: qty 21

## 2017-08-11 MED ORDER — FAMOTIDINE IN NACL 20-0.9 MG/50ML-% IV SOLN
20.0000 mg | Freq: Once | INTRAVENOUS | Status: AC
  Administered 2017-08-11: 20 mg via INTRAVENOUS

## 2017-08-11 MED ORDER — HEPARIN SOD (PORK) LOCK FLUSH 100 UNIT/ML IV SOLN
500.0000 [IU] | Freq: Once | INTRAVENOUS | Status: AC | PRN
  Administered 2017-08-11: 500 [IU]
  Filled 2017-08-11: qty 5

## 2017-08-11 MED ORDER — DEXAMETHASONE SODIUM PHOSPHATE 100 MG/10ML IJ SOLN
20.0000 mg | Freq: Once | INTRAMUSCULAR | Status: AC
  Administered 2017-08-11: 20 mg via INTRAVENOUS
  Filled 2017-08-11: qty 2

## 2017-08-11 MED ORDER — SODIUM CHLORIDE 0.9 % IV SOLN
Freq: Once | INTRAVENOUS | Status: AC
  Administered 2017-08-11: 13:00:00 via INTRAVENOUS

## 2017-08-11 MED ORDER — SODIUM CHLORIDE 0.9 % IV SOLN
185.8000 mg | Freq: Once | INTRAVENOUS | Status: AC
  Administered 2017-08-11: 190 mg via INTRAVENOUS
  Filled 2017-08-11: qty 19

## 2017-08-11 MED ORDER — DIPHENHYDRAMINE HCL 50 MG/ML IJ SOLN
50.0000 mg | Freq: Once | INTRAMUSCULAR | Status: AC
  Administered 2017-08-11: 50 mg via INTRAVENOUS

## 2017-08-11 MED ORDER — SODIUM CHLORIDE 0.9% FLUSH
10.0000 mL | INTRAVENOUS | Status: DC | PRN
  Administered 2017-08-11: 10 mL
  Filled 2017-08-11: qty 10

## 2017-08-11 NOTE — Patient Instructions (Signed)
   [facility] Discharge Instructions for Patients Receiving Chemotherapy  Today you received the following chemotherapy agents Taxol and Carboplatin   To help prevent nausea and vomiting after your treatment, we encourage you to take your nausea medication as directed.    If you develop nausea and vomiting that is not controlled by your nausea medication, call the clinic.   BELOW ARE SYMPTOMS THAT SHOULD BE REPORTED IMMEDIATELY:  *FEVER GREATER THAN 100.5 F  *CHILLS WITH OR WITHOUT FEVER  NAUSEA AND VOMITING THAT IS NOT CONTROLLED WITH YOUR NAUSEA MEDICATION  *UNUSUAL SHORTNESS OF BREATH  *UNUSUAL BRUISING OR BLEEDING  TENDERNESS IN MOUTH AND THROAT WITH OR WITHOUT PRESENCE OF ULCERS  *URINARY PROBLEMS  *BOWEL PROBLEMS  UNUSUAL RASH Items with * indicate a potential emergency and should be followed up as soon as possible.  Feel free to call the clinic should you have any questions or concerns. The clinic phone number is [phone].  Please show the CHEMO ALERT CARD at check-in to the Emergency Department and triage nurse.   

## 2017-08-14 ENCOUNTER — Telehealth: Payer: Self-pay

## 2017-08-14 NOTE — Telephone Encounter (Signed)
Per 7/22 no los

## 2017-08-18 ENCOUNTER — Inpatient Hospital Stay: Payer: Self-pay

## 2017-08-18 ENCOUNTER — Encounter: Payer: Self-pay | Admitting: Nurse Practitioner

## 2017-08-18 ENCOUNTER — Telehealth: Payer: Self-pay | Admitting: Hematology

## 2017-08-18 ENCOUNTER — Inpatient Hospital Stay (HOSPITAL_BASED_OUTPATIENT_CLINIC_OR_DEPARTMENT_OTHER): Payer: Self-pay | Admitting: Nurse Practitioner

## 2017-08-18 VITALS — BP 127/63 | HR 68 | Temp 98.2°F | Resp 17 | Ht 59.0 in | Wt 127.8 lb

## 2017-08-18 DIAGNOSIS — T451X5A Adverse effect of antineoplastic and immunosuppressive drugs, initial encounter: Secondary | ICD-10-CM

## 2017-08-18 DIAGNOSIS — C773 Secondary and unspecified malignant neoplasm of axilla and upper limb lymph nodes: Secondary | ICD-10-CM

## 2017-08-18 DIAGNOSIS — M791 Myalgia, unspecified site: Secondary | ICD-10-CM

## 2017-08-18 DIAGNOSIS — Z95828 Presence of other vascular implants and grafts: Secondary | ICD-10-CM

## 2017-08-18 DIAGNOSIS — C50212 Malignant neoplasm of upper-inner quadrant of left female breast: Secondary | ICD-10-CM

## 2017-08-18 DIAGNOSIS — Z8781 Personal history of (healed) traumatic fracture: Secondary | ICD-10-CM

## 2017-08-18 DIAGNOSIS — D6481 Anemia due to antineoplastic chemotherapy: Secondary | ICD-10-CM

## 2017-08-18 DIAGNOSIS — Z170 Estrogen receptor positive status [ER+]: Secondary | ICD-10-CM

## 2017-08-18 DIAGNOSIS — R5383 Other fatigue: Secondary | ICD-10-CM

## 2017-08-18 DIAGNOSIS — I10 Essential (primary) hypertension: Secondary | ICD-10-CM

## 2017-08-18 DIAGNOSIS — G629 Polyneuropathy, unspecified: Secondary | ICD-10-CM

## 2017-08-18 DIAGNOSIS — E119 Type 2 diabetes mellitus without complications: Secondary | ICD-10-CM

## 2017-08-18 DIAGNOSIS — R197 Diarrhea, unspecified: Secondary | ICD-10-CM

## 2017-08-18 DIAGNOSIS — E118 Type 2 diabetes mellitus with unspecified complications: Secondary | ICD-10-CM

## 2017-08-18 LAB — CMP (CANCER CENTER ONLY)
ALBUMIN: 3.8 g/dL (ref 3.5–5.0)
ALT: 18 U/L (ref 0–44)
ANION GAP: 11 (ref 5–15)
AST: 26 U/L (ref 15–41)
Alkaline Phosphatase: 83 U/L (ref 38–126)
BUN: 5 mg/dL — ABNORMAL LOW (ref 8–23)
CHLORIDE: 102 mmol/L (ref 98–111)
CO2: 26 mmol/L (ref 22–32)
Calcium: 9.2 mg/dL (ref 8.9–10.3)
Creatinine: 0.69 mg/dL (ref 0.44–1.00)
GFR, Estimated: >60 mL/min (ref >60)
GLUCOSE: 197 mg/dL — AB (ref 70–99)
Potassium: 3.7 mmol/L (ref 3.5–5.1)
SODIUM: 139 mmol/L (ref 135–145)
Total Bilirubin: 0.3 mg/dL (ref 0.3–1.2)
Total Protein: 7 g/dL (ref 6.5–8.1)

## 2017-08-18 LAB — CBC WITH DIFFERENTIAL (CANCER CENTER ONLY)
BASOS PCT: 0 %
Basophils Absolute: 0 10*3/uL (ref 0.0–0.1)
EOS ABS: 0.1 10*3/uL (ref 0.0–0.5)
EOS PCT: 3 %
HCT: 29.4 % — ABNORMAL LOW (ref 34.8–46.6)
HEMOGLOBIN: 10 g/dL — AB (ref 11.6–15.9)
LYMPHS ABS: 0.7 10*3/uL — AB (ref 0.9–3.3)
Lymphocytes Relative: 19 %
MCH: 32.5 pg (ref 25.1–34.0)
MCHC: 33.9 g/dL (ref 31.5–36.0)
MCV: 95.8 fL (ref 79.5–101.0)
MONOS PCT: 8 %
Monocytes Absolute: 0.3 10*3/uL (ref 0.1–0.9)
NEUTROS PCT: 70 %
Neutro Abs: 2.7 10*3/uL (ref 1.5–6.5)
PLATELETS: 257 10*3/uL (ref 145–400)
RBC: 3.06 MIL/uL — AB (ref 3.70–5.45)
RDW: 18.5 % — ABNORMAL HIGH (ref 11.2–14.5)
WBC: 3.9 10*3/uL (ref 3.9–10.3)

## 2017-08-18 MED ORDER — SODIUM CHLORIDE 0.9 % IV SOLN
20.0000 mg | Freq: Once | INTRAVENOUS | Status: AC
  Administered 2017-08-18: 20 mg via INTRAVENOUS
  Filled 2017-08-18: qty 2

## 2017-08-18 MED ORDER — DIPHENHYDRAMINE HCL 50 MG/ML IJ SOLN
INTRAMUSCULAR | Status: AC
  Filled 2017-08-18: qty 1

## 2017-08-18 MED ORDER — SODIUM CHLORIDE 0.9% FLUSH
10.0000 mL | INTRAVENOUS | Status: DC | PRN
  Administered 2017-08-18: 10 mL
  Filled 2017-08-18: qty 10

## 2017-08-18 MED ORDER — ALTEPLASE 2 MG IJ SOLR
2.0000 mg | Freq: Once | INTRAMUSCULAR | Status: AC | PRN
  Administered 2017-08-18: 2 mg
  Filled 2017-08-18: qty 2

## 2017-08-18 MED ORDER — HEPARIN SOD (PORK) LOCK FLUSH 100 UNIT/ML IV SOLN
500.0000 [IU] | Freq: Once | INTRAVENOUS | Status: AC | PRN
  Administered 2017-08-18: 500 [IU]
  Filled 2017-08-18: qty 5

## 2017-08-18 MED ORDER — DIPHENHYDRAMINE HCL 50 MG/ML IJ SOLN
50.0000 mg | Freq: Once | INTRAMUSCULAR | Status: AC
  Administered 2017-08-18: 50 mg via INTRAVENOUS

## 2017-08-18 MED ORDER — FAMOTIDINE IN NACL 20-0.9 MG/50ML-% IV SOLN
20.0000 mg | Freq: Once | INTRAVENOUS | Status: AC
  Administered 2017-08-18: 20 mg via INTRAVENOUS

## 2017-08-18 MED ORDER — FAMOTIDINE IN NACL 20-0.9 MG/50ML-% IV SOLN
INTRAVENOUS | Status: AC
  Filled 2017-08-18: qty 50

## 2017-08-18 MED ORDER — SODIUM CHLORIDE 0.9 % IV SOLN
185.8000 mg | Freq: Once | INTRAVENOUS | Status: AC
  Administered 2017-08-18: 190 mg via INTRAVENOUS
  Filled 2017-08-18: qty 19

## 2017-08-18 MED ORDER — SODIUM CHLORIDE 0.9 % IV SOLN
80.0000 mg/m2 | Freq: Once | INTRAVENOUS | Status: AC
  Administered 2017-08-18: 126 mg via INTRAVENOUS
  Filled 2017-08-18: qty 21

## 2017-08-18 MED ORDER — SODIUM CHLORIDE 0.9 % IV SOLN
Freq: Once | INTRAVENOUS | Status: AC
  Administered 2017-08-18: 10:00:00 via INTRAVENOUS
  Filled 2017-08-18: qty 250

## 2017-08-18 NOTE — Telephone Encounter (Signed)
No LOS 7/26 °

## 2017-08-18 NOTE — Progress Notes (Signed)
Port-A-Cath  Accessed, site flushes well with no swelling or pain noted when flushed with NS.  Cath-Flo instilled per policy, site marked. Will monitor for blood return.
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
## 2017-08-18 NOTE — Patient Instructions (Signed)
[facility] Discharge Instructions for Patients Receiving Chemotherapy  Today you received the following chemotherapy agents Taxol, carboplatin To help prevent nausea and vomiting after your treatment, we encourage you to take your nausea medication as directed If you develop nausea and vomiting that is not controlled by your nausea medication, call the clinic.   BELOW ARE SYMPTOMS THAT SHOULD BE REPORTED IMMEDIATELY:  *FEVER GREATER THAN 100.5 F  *CHILLS WITH OR WITHOUT FEVER  NAUSEA AND VOMITING THAT IS NOT CONTROLLED WITH YOUR NAUSEA MEDICATION  *UNUSUAL SHORTNESS OF BREATH  *UNUSUAL BRUISING OR BLEEDING  TENDERNESS IN MOUTH AND THROAT WITH OR WITHOUT PRESENCE OF ULCERS  *URINARY PROBLEMS  *BOWEL PROBLEMS  UNUSUAL RASH Items with * indicate a potential emergency and should be followed up as soon as possible.  Feel free to call the clinic should you have any questions or concerns. The clinic phone number is [phone].  Please show the CHEMO ALERT CARD at check-in to the Emergency Department and triage nurse.   
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
## 2017-08-25 ENCOUNTER — Encounter: Payer: Self-pay | Admitting: Hematology

## 2017-08-25 ENCOUNTER — Inpatient Hospital Stay: Payer: Medicaid Other

## 2017-08-25 ENCOUNTER — Inpatient Hospital Stay: Payer: Medicaid Other | Attending: Nurse Practitioner

## 2017-08-25 ENCOUNTER — Telehealth: Payer: Self-pay | Admitting: Hematology

## 2017-08-25 ENCOUNTER — Inpatient Hospital Stay (HOSPITAL_BASED_OUTPATIENT_CLINIC_OR_DEPARTMENT_OTHER): Payer: Medicaid Other | Admitting: Hematology

## 2017-08-25 VITALS — BP 144/61 | HR 68 | Temp 97.8°F | Resp 17 | Ht 59.0 in | Wt 126.8 lb

## 2017-08-25 DIAGNOSIS — Z95828 Presence of other vascular implants and grafts: Secondary | ICD-10-CM

## 2017-08-25 DIAGNOSIS — Z8781 Personal history of (healed) traumatic fracture: Secondary | ICD-10-CM

## 2017-08-25 DIAGNOSIS — S82142A Displaced bicondylar fracture of left tibia, initial encounter for closed fracture: Secondary | ICD-10-CM | POA: Insufficient documentation

## 2017-08-25 DIAGNOSIS — E876 Hypokalemia: Secondary | ICD-10-CM | POA: Diagnosis not present

## 2017-08-25 DIAGNOSIS — D6481 Anemia due to antineoplastic chemotherapy: Secondary | ICD-10-CM | POA: Insufficient documentation

## 2017-08-25 DIAGNOSIS — Z170 Estrogen receptor positive status [ER+]: Secondary | ICD-10-CM

## 2017-08-25 DIAGNOSIS — C773 Secondary and unspecified malignant neoplasm of axilla and upper limb lymph nodes: Secondary | ICD-10-CM

## 2017-08-25 DIAGNOSIS — D701 Agranulocytosis secondary to cancer chemotherapy: Secondary | ICD-10-CM | POA: Diagnosis not present

## 2017-08-25 DIAGNOSIS — C50212 Malignant neoplasm of upper-inner quadrant of left female breast: Secondary | ICD-10-CM

## 2017-08-25 DIAGNOSIS — G620 Drug-induced polyneuropathy: Secondary | ICD-10-CM

## 2017-08-25 DIAGNOSIS — Z7982 Long term (current) use of aspirin: Secondary | ICD-10-CM | POA: Insufficient documentation

## 2017-08-25 DIAGNOSIS — I10 Essential (primary) hypertension: Secondary | ICD-10-CM

## 2017-08-25 DIAGNOSIS — E1142 Type 2 diabetes mellitus with diabetic polyneuropathy: Secondary | ICD-10-CM | POA: Diagnosis not present

## 2017-08-25 DIAGNOSIS — Z9221 Personal history of antineoplastic chemotherapy: Secondary | ICD-10-CM | POA: Insufficient documentation

## 2017-08-25 DIAGNOSIS — T451X5A Adverse effect of antineoplastic and immunosuppressive drugs, initial encounter: Secondary | ICD-10-CM

## 2017-08-25 DIAGNOSIS — Z5111 Encounter for antineoplastic chemotherapy: Secondary | ICD-10-CM | POA: Diagnosis not present

## 2017-08-25 DIAGNOSIS — R634 Abnormal weight loss: Secondary | ICD-10-CM

## 2017-08-25 DIAGNOSIS — E118 Type 2 diabetes mellitus with unspecified complications: Secondary | ICD-10-CM

## 2017-08-25 DIAGNOSIS — E119 Type 2 diabetes mellitus without complications: Secondary | ICD-10-CM

## 2017-08-25 LAB — CMP (CANCER CENTER ONLY)
ALT: 19 U/L (ref 0–44)
AST: 24 U/L (ref 15–41)
Albumin: 3.7 g/dL (ref 3.5–5.0)
Alkaline Phosphatase: 93 U/L (ref 38–126)
Anion gap: 13 (ref 5–15)
BUN: 6 mg/dL — ABNORMAL LOW (ref 8–23)
CHLORIDE: 101 mmol/L (ref 98–111)
CO2: 24 mmol/L (ref 22–32)
CREATININE: 0.67 mg/dL (ref 0.44–1.00)
Calcium: 8.9 mg/dL (ref 8.9–10.3)
Glucose, Bld: 224 mg/dL — ABNORMAL HIGH (ref 70–99)
Potassium: 4 mmol/L (ref 3.5–5.1)
SODIUM: 138 mmol/L (ref 135–145)
Total Bilirubin: 0.4 mg/dL (ref 0.3–1.2)
Total Protein: 6.8 g/dL (ref 6.5–8.1)

## 2017-08-25 LAB — CBC WITH DIFFERENTIAL (CANCER CENTER ONLY)
Basophils Absolute: 0 10*3/uL (ref 0.0–0.1)
Basophils Relative: 0 %
EOS ABS: 0.1 10*3/uL (ref 0.0–0.5)
Eosinophils Relative: 4 %
HCT: 28 % — ABNORMAL LOW (ref 34.8–46.6)
HEMOGLOBIN: 9.5 g/dL — AB (ref 11.6–15.9)
LYMPHS ABS: 0.7 10*3/uL — AB (ref 0.9–3.3)
LYMPHS PCT: 19 %
MCH: 33.2 pg (ref 25.1–34.0)
MCHC: 34 g/dL (ref 31.5–36.0)
MCV: 97.7 fL (ref 79.5–101.0)
MONOS PCT: 6 %
Monocytes Absolute: 0.2 10*3/uL (ref 0.1–0.9)
NEUTROS PCT: 71 %
Neutro Abs: 2.7 10*3/uL (ref 1.5–6.5)
Platelet Count: 182 10*3/uL (ref 145–400)
RBC: 2.87 MIL/uL — AB (ref 3.70–5.45)
RDW: 20.8 % — ABNORMAL HIGH (ref 11.2–14.5)
WBC: 3.8 10*3/uL — AB (ref 3.9–10.3)

## 2017-08-25 MED ORDER — SODIUM CHLORIDE 0.9 % IV SOLN: 10.0000 mg | Freq: Once | INTRAVENOUS | Status: DC

## 2017-08-25 MED ORDER — FAMOTIDINE IN NACL 20-0.9 MG/50ML-% IV SOLN
INTRAVENOUS | Status: AC
Start: 2017-08-25 — End: ?
  Filled 2017-08-25: qty 50

## 2017-08-25 MED ORDER — CARBOPLATIN CHEMO INJECTION 450 MG/45ML
185.8000 mg | Freq: Once | INTRAVENOUS | Status: AC
  Administered 2017-08-25: 190 mg via INTRAVENOUS
  Filled 2017-08-25: qty 19

## 2017-08-25 MED ORDER — SODIUM CHLORIDE 0.9% FLUSH
10.0000 mL | INTRAVENOUS | Status: DC | PRN
  Administered 2017-08-25: 10 mL
  Filled 2017-08-25: qty 10

## 2017-08-25 MED ORDER — DIPHENHYDRAMINE HCL 50 MG/ML IJ SOLN
50.0000 mg | Freq: Once | INTRAMUSCULAR | Status: AC
  Administered 2017-08-25: 50 mg via INTRAVENOUS

## 2017-08-25 MED ORDER — DEXAMETHASONE SODIUM PHOSPHATE 10 MG/ML IJ SOLN
10.0000 mg | Freq: Once | INTRAMUSCULAR | Status: AC
  Administered 2017-08-25: 10 mg via INTRAVENOUS

## 2017-08-25 MED ORDER — HEPARIN SOD (PORK) LOCK FLUSH 100 UNIT/ML IV SOLN
500.0000 [IU] | Freq: Once | INTRAVENOUS | Status: AC | PRN
  Administered 2017-08-25: 500 [IU]
  Filled 2017-08-25: qty 5

## 2017-08-25 MED ORDER — DIPHENHYDRAMINE HCL 50 MG/ML IJ SOLN
INTRAMUSCULAR | Status: AC
  Filled 2017-08-25: qty 1

## 2017-08-25 MED ORDER — FAMOTIDINE IN NACL 20-0.9 MG/50ML-% IV SOLN
20.0000 mg | Freq: Once | INTRAVENOUS | Status: AC
  Administered 2017-08-25: 20 mg via INTRAVENOUS

## 2017-08-25 MED ORDER — SODIUM CHLORIDE 0.9 % IV SOLN
Freq: Once | INTRAVENOUS | Status: AC
  Administered 2017-08-25: 12:00:00 via INTRAVENOUS
  Filled 2017-08-25: qty 250

## 2017-08-25 MED ORDER — DEXAMETHASONE SODIUM PHOSPHATE 10 MG/ML IJ SOLN
INTRAMUSCULAR | Status: AC
  Filled 2017-08-25: qty 1

## 2017-08-25 MED ORDER — SODIUM CHLORIDE 0.9 % IV SOLN
80.0000 mg/m2 | Freq: Once | INTRAVENOUS | Status: AC
  Administered 2017-08-25: 126 mg via INTRAVENOUS
  Filled 2017-08-25: qty 21

## 2017-08-25 NOTE — Patient Instructions (Signed)
[facility] Discharge Instructions for Patients Receiving Chemotherapy  Today you received the following chemotherapy agents: Paclitaxel (Taxol) and Carboplatin (Paraplatin).  To help prevent nausea and vomiting after your treatment, we encourage you to take your nausea medication as prescribed.  If you develop nausea and vomiting that is not controlled by your nausea medication, call the clinic.   BELOW ARE SYMPTOMS THAT SHOULD BE REPORTED IMMEDIATELY:  *FEVER GREATER THAN 100.5 F  *CHILLS WITH OR WITHOUT FEVER  NAUSEA AND VOMITING THAT IS NOT CONTROLLED WITH YOUR NAUSEA MEDICATION  *UNUSUAL SHORTNESS OF BREATH  *UNUSUAL BRUISING OR BLEEDING  TENDERNESS IN MOUTH AND THROAT WITH OR WITHOUT PRESENCE OF ULCERS  *URINARY PROBLEMS  *BOWEL PROBLEMS  UNUSUAL RASH Items with * indicate a potential emergency and should be followed up as soon as possible.  Feel free to call the clinic should you have any questions or concerns. The clinic phone number is [phone].  Please show the CHEMO ALERT CARD at check-in to the Emergency Department and triage nurse.   

## 2017-08-25 NOTE — Telephone Encounter (Signed)
No los 8/2 

## 2017-09-01 ENCOUNTER — Inpatient Hospital Stay: Payer: Medicaid Other

## 2017-09-01 ENCOUNTER — Encounter: Payer: Self-pay | Admitting: Nurse Practitioner

## 2017-09-01 ENCOUNTER — Inpatient Hospital Stay (HOSPITAL_BASED_OUTPATIENT_CLINIC_OR_DEPARTMENT_OTHER): Payer: Medicaid Other | Admitting: Nurse Practitioner

## 2017-09-01 ENCOUNTER — Telehealth: Payer: Self-pay | Admitting: Hematology

## 2017-09-01 VITALS — BP 143/58 | HR 73 | Temp 98.9°F | Resp 18 | Ht 59.0 in | Wt 126.3 lb

## 2017-09-01 DIAGNOSIS — Z170 Estrogen receptor positive status [ER+]: Secondary | ICD-10-CM

## 2017-09-01 DIAGNOSIS — Z9221 Personal history of antineoplastic chemotherapy: Secondary | ICD-10-CM | POA: Diagnosis not present

## 2017-09-01 DIAGNOSIS — Z7982 Long term (current) use of aspirin: Secondary | ICD-10-CM | POA: Diagnosis not present

## 2017-09-01 DIAGNOSIS — C773 Secondary and unspecified malignant neoplasm of axilla and upper limb lymph nodes: Secondary | ICD-10-CM

## 2017-09-01 DIAGNOSIS — C50212 Malignant neoplasm of upper-inner quadrant of left female breast: Secondary | ICD-10-CM

## 2017-09-01 DIAGNOSIS — S82142A Displaced bicondylar fracture of left tibia, initial encounter for closed fracture: Secondary | ICD-10-CM | POA: Diagnosis not present

## 2017-09-01 DIAGNOSIS — E1142 Type 2 diabetes mellitus with diabetic polyneuropathy: Secondary | ICD-10-CM | POA: Diagnosis not present

## 2017-09-01 DIAGNOSIS — I10 Essential (primary) hypertension: Secondary | ICD-10-CM

## 2017-09-01 DIAGNOSIS — D6481 Anemia due to antineoplastic chemotherapy: Secondary | ICD-10-CM | POA: Diagnosis not present

## 2017-09-01 DIAGNOSIS — E876 Hypokalemia: Secondary | ICD-10-CM | POA: Diagnosis not present

## 2017-09-01 DIAGNOSIS — Z5111 Encounter for antineoplastic chemotherapy: Secondary | ICD-10-CM | POA: Diagnosis not present

## 2017-09-01 DIAGNOSIS — T451X5A Adverse effect of antineoplastic and immunosuppressive drugs, initial encounter: Secondary | ICD-10-CM

## 2017-09-01 DIAGNOSIS — D701 Agranulocytosis secondary to cancer chemotherapy: Secondary | ICD-10-CM | POA: Diagnosis not present

## 2017-09-01 DIAGNOSIS — Z95828 Presence of other vascular implants and grafts: Secondary | ICD-10-CM

## 2017-09-01 LAB — CBC WITH DIFFERENTIAL (CANCER CENTER ONLY)
Basophils Absolute: 0 10*3/uL (ref 0.0–0.1)
Basophils Relative: 1 %
EOS ABS: 0.1 10*3/uL (ref 0.0–0.5)
EOS PCT: 6 %
HCT: 25.8 % — ABNORMAL LOW (ref 34.8–46.6)
Hemoglobin: 8.9 g/dL — ABNORMAL LOW (ref 11.6–15.9)
LYMPHS ABS: 0.6 10*3/uL — AB (ref 0.9–3.3)
LYMPHS PCT: 24 %
MCH: 33.9 pg (ref 25.1–34.0)
MCHC: 34.3 g/dL (ref 31.5–36.0)
MCV: 98.8 fL (ref 79.5–101.0)
MONO ABS: 0.2 10*3/uL (ref 0.1–0.9)
MONOS PCT: 6 %
Neutro Abs: 1.6 10*3/uL (ref 1.5–6.5)
Neutrophils Relative %: 63 %
PLATELETS: 107 10*3/uL — AB (ref 145–400)
RBC: 2.61 MIL/uL — ABNORMAL LOW (ref 3.70–5.45)
RDW: 21.7 % — AB (ref 11.2–14.5)
WBC: 2.5 10*3/uL — AB (ref 3.9–10.3)

## 2017-09-01 LAB — CMP (CANCER CENTER ONLY)
ALBUMIN: 3.6 g/dL (ref 3.5–5.0)
ALT: 22 U/L (ref 0–44)
AST: 30 U/L (ref 15–41)
Alkaline Phosphatase: 86 U/L (ref 38–126)
Anion gap: 9 (ref 5–15)
BUN: 7 mg/dL — AB (ref 8–23)
CO2: 25 mmol/L (ref 22–32)
CREATININE: 0.61 mg/dL (ref 0.44–1.00)
Calcium: 8.7 mg/dL — ABNORMAL LOW (ref 8.9–10.3)
Chloride: 104 mmol/L (ref 98–111)
GFR, Est AFR Am: >60 mL/min (ref >60)
GFR, Estimated: >60 mL/min (ref >60)
GLUCOSE: 203 mg/dL — AB (ref 70–99)
POTASSIUM: 3.5 mmol/L (ref 3.5–5.1)
Sodium: 138 mmol/L (ref 135–145)
Total Bilirubin: 0.6 mg/dL (ref 0.3–1.2)
Total Protein: 6.8 g/dL (ref 6.5–8.1)

## 2017-09-01 MED ORDER — FAMOTIDINE IN NACL 20-0.9 MG/50ML-% IV SOLN
INTRAVENOUS | Status: AC
  Filled 2017-09-01: qty 50

## 2017-09-01 MED ORDER — FAMOTIDINE IN NACL 20-0.9 MG/50ML-% IV SOLN
20.0000 mg | Freq: Once | INTRAVENOUS | Status: AC
  Administered 2017-09-01: 20 mg via INTRAVENOUS

## 2017-09-01 MED ORDER — SODIUM CHLORIDE 0.9 % IV SOLN
139.3500 mg | Freq: Once | INTRAVENOUS | Status: AC
  Administered 2017-09-01: 140 mg via INTRAVENOUS
  Filled 2017-09-01: qty 14

## 2017-09-01 MED ORDER — DEXAMETHASONE SODIUM PHOSPHATE 10 MG/ML IJ SOLN
INTRAMUSCULAR | Status: AC
  Filled 2017-09-01: qty 1

## 2017-09-01 MED ORDER — DEXAMETHASONE SODIUM PHOSPHATE 10 MG/ML IJ SOLN
10.0000 mg | Freq: Once | INTRAMUSCULAR | Status: AC
  Administered 2017-09-01: 10 mg via INTRAVENOUS

## 2017-09-01 MED ORDER — SODIUM CHLORIDE 0.9 % IV SOLN
80.0000 mg/m2 | Freq: Once | INTRAVENOUS | Status: AC
  Administered 2017-09-01: 126 mg via INTRAVENOUS
  Filled 2017-09-01: qty 21

## 2017-09-01 MED ORDER — SODIUM CHLORIDE 0.9% FLUSH
10.0000 mL | INTRAVENOUS | Status: DC | PRN
  Administered 2017-09-01: 10 mL
  Filled 2017-09-01: qty 10

## 2017-09-01 MED ORDER — DIPHENHYDRAMINE HCL 50 MG/ML IJ SOLN
50.0000 mg | Freq: Once | INTRAMUSCULAR | Status: AC
  Administered 2017-09-01: 50 mg via INTRAVENOUS

## 2017-09-01 MED ORDER — SODIUM CHLORIDE 0.9 % IV SOLN: 10.0000 mg | Freq: Once | INTRAVENOUS | Status: DC

## 2017-09-01 MED ORDER — SODIUM CHLORIDE 0.9 % IV SOLN
Freq: Once | INTRAVENOUS | Status: AC
  Administered 2017-09-01: 13:00:00 via INTRAVENOUS
  Filled 2017-09-01: qty 250

## 2017-09-01 MED ORDER — HEPARIN SOD (PORK) LOCK FLUSH 100 UNIT/ML IV SOLN
500.0000 [IU] | Freq: Once | INTRAVENOUS | Status: AC | PRN
  Administered 2017-09-01: 500 [IU]
  Filled 2017-09-01: qty 5

## 2017-09-01 MED ORDER — DIPHENHYDRAMINE HCL 50 MG/ML IJ SOLN
INTRAMUSCULAR | Status: AC
  Filled 2017-09-01: qty 1
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
## 2017-09-01 NOTE — Telephone Encounter (Signed)
Removed from DAR/ working on scheduling appts per 8/9 los

## 2017-09-01 NOTE — Patient Instructions (Signed)
[facility] Discharge Instructions for Patients Receiving Chemotherapy  Today you received the following chemotherapy agents:  Taxol, Carboplatin  To help prevent nausea and vomiting after your treatment, we encourage you to take your nausea medication as prescribed.   If you develop nausea and vomiting that is not controlled by your nausea medication, call the clinic.   BELOW ARE SYMPTOMS THAT SHOULD BE REPORTED IMMEDIATELY:  *FEVER GREATER THAN 100.5 F  *CHILLS WITH OR WITHOUT FEVER  NAUSEA AND VOMITING THAT IS NOT CONTROLLED WITH YOUR NAUSEA MEDICATION  *UNUSUAL SHORTNESS OF BREATH  *UNUSUAL BRUISING OR BLEEDING  TENDERNESS IN MOUTH AND THROAT WITH OR WITHOUT PRESENCE OF ULCERS  *URINARY PROBLEMS  *BOWEL PROBLEMS  UNUSUAL RASH Items with * indicate a potential emergency and should be followed up as soon as possible.  Feel free to call the clinic should you have any questions or concerns. The clinic phone number is [phone].  Please show the CHEMO ALERT CARD at check-in to the Emergency Department and triage nurse.   

## 2017-09-04 ENCOUNTER — Telehealth: Payer: Self-pay | Admitting: Hematology

## 2017-09-04 NOTE — Telephone Encounter (Signed)
Appts scheduled and I spoke with Daughter Amanda Duarte regarding per 8/9 los
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
## 2017-09-08 ENCOUNTER — Telehealth: Payer: Self-pay | Admitting: Hematology

## 2017-09-08 ENCOUNTER — Inpatient Hospital Stay: Payer: Medicaid Other

## 2017-09-08 ENCOUNTER — Inpatient Hospital Stay (HOSPITAL_BASED_OUTPATIENT_CLINIC_OR_DEPARTMENT_OTHER): Payer: Medicaid Other | Admitting: Hematology

## 2017-09-08 ENCOUNTER — Inpatient Hospital Stay: Payer: Self-pay | Attending: Hematology

## 2017-09-08 ENCOUNTER — Encounter: Payer: Self-pay | Admitting: Hematology

## 2017-09-08 VITALS — BP 124/52 | HR 65 | Temp 98.5°F | Resp 18 | Ht 59.0 in | Wt 126.1 lb

## 2017-09-08 DIAGNOSIS — E118 Type 2 diabetes mellitus with unspecified complications: Secondary | ICD-10-CM

## 2017-09-08 DIAGNOSIS — Z9221 Personal history of antineoplastic chemotherapy: Secondary | ICD-10-CM

## 2017-09-08 DIAGNOSIS — C773 Secondary and unspecified malignant neoplasm of axilla and upper limb lymph nodes: Secondary | ICD-10-CM

## 2017-09-08 DIAGNOSIS — D6481 Anemia due to antineoplastic chemotherapy: Secondary | ICD-10-CM | POA: Diagnosis not present

## 2017-09-08 DIAGNOSIS — Z7982 Long term (current) use of aspirin: Secondary | ICD-10-CM

## 2017-09-08 DIAGNOSIS — C50212 Malignant neoplasm of upper-inner quadrant of left female breast: Secondary | ICD-10-CM

## 2017-09-08 DIAGNOSIS — D701 Agranulocytosis secondary to cancer chemotherapy: Secondary | ICD-10-CM

## 2017-09-08 DIAGNOSIS — E1142 Type 2 diabetes mellitus with diabetic polyneuropathy: Secondary | ICD-10-CM | POA: Diagnosis not present

## 2017-09-08 DIAGNOSIS — I10 Essential (primary) hypertension: Secondary | ICD-10-CM

## 2017-09-08 DIAGNOSIS — S82142A Displaced bicondylar fracture of left tibia, initial encounter for closed fracture: Secondary | ICD-10-CM | POA: Diagnosis not present

## 2017-09-08 DIAGNOSIS — T451X5A Adverse effect of antineoplastic and immunosuppressive drugs, initial encounter: Secondary | ICD-10-CM

## 2017-09-08 DIAGNOSIS — Z170 Estrogen receptor positive status [ER+]: Secondary | ICD-10-CM

## 2017-09-08 DIAGNOSIS — S82142S Displaced bicondylar fracture of left tibia, sequela: Secondary | ICD-10-CM

## 2017-09-08 DIAGNOSIS — Z95828 Presence of other vascular implants and grafts: Secondary | ICD-10-CM

## 2017-09-08 DIAGNOSIS — Z5111 Encounter for antineoplastic chemotherapy: Secondary | ICD-10-CM | POA: Diagnosis not present

## 2017-09-08 DIAGNOSIS — E876 Hypokalemia: Secondary | ICD-10-CM | POA: Diagnosis not present

## 2017-09-08 LAB — CBC WITH DIFFERENTIAL (CANCER CENTER ONLY)
BASOS PCT: 1 %
Basophils Absolute: 0 10*3/uL (ref 0.0–0.1)
EOS PCT: 5 %
Eosinophils Absolute: 0.1 10*3/uL (ref 0.0–0.5)
HCT: 25.6 % — ABNORMAL LOW (ref 34.8–46.6)
Hemoglobin: 8.6 g/dL — ABNORMAL LOW (ref 11.6–15.9)
Lymphocytes Relative: 42 %
Lymphs Abs: 0.7 10*3/uL — ABNORMAL LOW (ref 0.9–3.3)
MCH: 34 pg (ref 25.1–34.0)
MCHC: 33.6 g/dL (ref 31.5–36.0)
MCV: 101.2 fL — ABNORMAL HIGH (ref 79.5–101.0)
Monocytes Absolute: 0.1 10*3/uL (ref 0.1–0.9)
Monocytes Relative: 6 %
Neutro Abs: 0.8 10*3/uL — ABNORMAL LOW (ref 1.5–6.5)
Neutrophils Relative %: 46 %
PLATELETS: 92 10*3/uL — AB (ref 145–400)
RBC: 2.53 MIL/uL — AB (ref 3.70–5.45)
RDW: 20.4 % — ABNORMAL HIGH (ref 11.2–14.5)
WBC: 1.7 10*3/uL — AB (ref 3.9–10.3)

## 2017-09-08 LAB — CMP (CANCER CENTER ONLY)
ALT: 27 U/L (ref 0–44)
AST: 40 U/L (ref 15–41)
Albumin: 3.9 g/dL (ref 3.5–5.0)
Alkaline Phosphatase: 90 U/L (ref 38–126)
Anion gap: 9 (ref 5–15)
BILIRUBIN TOTAL: 0.5 mg/dL (ref 0.3–1.2)
BUN: 5 mg/dL — AB (ref 8–23)
CO2: 27 mmol/L (ref 22–32)
Calcium: 8.9 mg/dL (ref 8.9–10.3)
Chloride: 104 mmol/L (ref 98–111)
Creatinine: 0.71 mg/dL (ref 0.44–1.00)
GFR, Est AFR Am: >60 mL/min (ref >60)
Glucose, Bld: 127 mg/dL — ABNORMAL HIGH (ref 70–99)
POTASSIUM: 4 mmol/L (ref 3.5–5.1)
Sodium: 140 mmol/L (ref 135–145)
TOTAL PROTEIN: 7.3 g/dL (ref 6.5–8.1)

## 2017-09-08 MED ORDER — HEPARIN SOD (PORK) LOCK FLUSH 100 UNIT/ML IV SOLN
500.0000 [IU] | Freq: Once | INTRAVENOUS | Status: AC | PRN
  Administered 2017-09-08: 500 [IU]
  Filled 2017-09-08: qty 5

## 2017-09-08 MED ORDER — DIPHENHYDRAMINE HCL 50 MG/ML IJ SOLN
50.0000 mg | Freq: Once | INTRAMUSCULAR | Status: AC
  Administered 2017-09-08: 50 mg via INTRAVENOUS

## 2017-09-08 MED ORDER — SODIUM CHLORIDE 0.9 % IV SOLN
80.0000 mg/m2 | Freq: Once | INTRAVENOUS | Status: AC
  Administered 2017-09-08: 126 mg via INTRAVENOUS
  Filled 2017-09-08: qty 21

## 2017-09-08 MED ORDER — SODIUM CHLORIDE 0.9% FLUSH
10.0000 mL | INTRAVENOUS | Status: DC | PRN
  Administered 2017-09-08: 10 mL
  Filled 2017-09-08: qty 10

## 2017-09-08 MED ORDER — DEXAMETHASONE SODIUM PHOSPHATE 10 MG/ML IJ SOLN
INTRAMUSCULAR | Status: AC
  Filled 2017-09-08: qty 1

## 2017-09-08 MED ORDER — DIPHENHYDRAMINE HCL 50 MG/ML IJ SOLN
INTRAMUSCULAR | Status: AC
Start: 2017-09-08 — End: ?
  Filled 2017-09-08: qty 1

## 2017-09-08 MED ORDER — FAMOTIDINE IN NACL 20-0.9 MG/50ML-% IV SOLN
INTRAVENOUS | Status: AC
  Filled 2017-09-08: qty 50

## 2017-09-08 MED ORDER — SODIUM CHLORIDE 0.9 % IV SOLN
Freq: Once | INTRAVENOUS | Status: AC
  Administered 2017-09-08: 12:00:00 via INTRAVENOUS
  Filled 2017-09-08: qty 250

## 2017-09-08 MED ORDER — FAMOTIDINE IN NACL 20-0.9 MG/50ML-% IV SOLN
20.0000 mg | Freq: Once | INTRAVENOUS | Status: AC
  Administered 2017-09-08: 20 mg via INTRAVENOUS

## 2017-09-08 MED ORDER — ALTEPLASE 2 MG IJ SOLR
2.0000 mg | Freq: Once | INTRAMUSCULAR | Status: AC | PRN
  Administered 2017-09-08: 2 mg
  Filled 2017-09-08: qty 2

## 2017-09-08 MED ORDER — DEXAMETHASONE SODIUM PHOSPHATE 10 MG/ML IJ SOLN
10.0000 mg | Freq: Once | INTRAMUSCULAR | Status: AC
  Administered 2017-09-08: 10 mg via INTRAVENOUS

## 2017-09-08 NOTE — Patient Instructions (Addendum)
[facility] Discharge Instructions for Patients Receiving Chemotherapy  Today you received the following chemotherapy agents :  Taxol.  To help prevent nausea and vomiting after your treatment, we encourage you to take your nausea medication as prescribed.   If you develop nausea and vomiting that is not controlled by your nausea medication, call the clinic.   BELOW ARE SYMPTOMS THAT SHOULD BE REPORTED IMMEDIATELY:  *FEVER GREATER THAN 100.5 F  *CHILLS WITH OR WITHOUT FEVER  NAUSEA AND VOMITING THAT IS NOT CONTROLLED WITH YOUR NAUSEA MEDICATION  *UNUSUAL SHORTNESS OF BREATH  *UNUSUAL BRUISING OR BLEEDING  TENDERNESS IN MOUTH AND THROAT WITH OR WITHOUT PRESENCE OF ULCERS  *URINARY PROBLEMS  *BOWEL PROBLEMS  UNUSUAL RASH Items with * indicate a potential emergency and should be followed up as soon as possible.  Feel free to call the clinic should you have any questions or concerns. The clinic phone number is [phone].  Please show the CHEMO ALERT CARD at check-in to the Emergency Department and triage nurse.   

## 2017-09-08 NOTE — Progress Notes (Signed)
Per [name],  [place] to treat with Taxol only today with all lab results.  Pt to receive Granix next Monday.

## 2017-09-08 NOTE — Progress Notes (Signed)
Port-A-Cath accesses. Site flushes well with no pain or swelling noted upon flushing. Cath-Flo instilled per policy, site marked  Will monitor for blood return.

## 2017-09-11 ENCOUNTER — Telehealth: Payer: Self-pay | Admitting: Hematology

## 2017-09-11 NOTE — Telephone Encounter (Signed)
Appts scheduled AVS/Calendar printed 8/16 for 8/16 LOS

## 2017-09-12 ENCOUNTER — Inpatient Hospital Stay: Payer: Medicaid Other

## 2017-09-12 VITALS — BP 141/65 | HR 73 | Temp 98.6°F | Resp 18

## 2017-09-12 DIAGNOSIS — Z95828 Presence of other vascular implants and grafts: Secondary | ICD-10-CM

## 2017-09-12 DIAGNOSIS — S82142A Displaced bicondylar fracture of left tibia, initial encounter for closed fracture: Secondary | ICD-10-CM | POA: Diagnosis not present

## 2017-09-12 DIAGNOSIS — I10 Essential (primary) hypertension: Secondary | ICD-10-CM | POA: Diagnosis not present

## 2017-09-12 DIAGNOSIS — E876 Hypokalemia: Secondary | ICD-10-CM | POA: Diagnosis not present

## 2017-09-12 DIAGNOSIS — D6481 Anemia due to antineoplastic chemotherapy: Secondary | ICD-10-CM | POA: Diagnosis not present

## 2017-09-12 DIAGNOSIS — C50212 Malignant neoplasm of upper-inner quadrant of left female breast: Secondary | ICD-10-CM | POA: Diagnosis not present

## 2017-09-12 DIAGNOSIS — Z5111 Encounter for antineoplastic chemotherapy: Secondary | ICD-10-CM | POA: Diagnosis not present

## 2017-09-12 DIAGNOSIS — D701 Agranulocytosis secondary to cancer chemotherapy: Secondary | ICD-10-CM | POA: Diagnosis not present

## 2017-09-12 DIAGNOSIS — Z9221 Personal history of antineoplastic chemotherapy: Secondary | ICD-10-CM | POA: Diagnosis not present

## 2017-09-12 DIAGNOSIS — Z7982 Long term (current) use of aspirin: Secondary | ICD-10-CM | POA: Diagnosis not present

## 2017-09-12 DIAGNOSIS — E1142 Type 2 diabetes mellitus with diabetic polyneuropathy: Secondary | ICD-10-CM | POA: Diagnosis not present

## 2017-09-12 DIAGNOSIS — C773 Secondary and unspecified malignant neoplasm of axilla and upper limb lymph nodes: Secondary | ICD-10-CM | POA: Diagnosis not present

## 2017-09-12 MED ORDER — TBO-FILGRASTIM 300 MCG/0.5ML ~~LOC~~ SOSY
300.0000 ug | PREFILLED_SYRINGE | Freq: Once | SUBCUTANEOUS | Status: AC
  Administered 2017-09-12: 300 ug via SUBCUTANEOUS

## 2017-09-12 NOTE — Patient Instructions (Signed)
Tbo-Filgrastim injection What is this medicine? TBO-FILGRASTIM (T B O fil GRA stim) is a granulocyte colony-stimulating factor that stimulates the growth of neutrophils, a type of white blood cell important in the body's fight against infection. It is used to reduce the incidence of fever and infection in patients with certain types of cancer who are receiving chemotherapy that affects the bone marrow. This medicine may be used for other purposes; ask your health care provider or pharmacist if you have questions. COMMON BRAND NAME(S): Granix What should I tell my health care provider before I take this medicine? They need to know if you have any of these conditions: -bone scan or tests planned -kidney disease -sickle cell anemia -an unusual or allergic reaction to tbo-filgrastim, filgrastim, pegfilgrastim, other medicines, foods, dyes, or preservatives -pregnant or trying to get pregnant -breast-feeding How should I use this medicine? This medicine is for injection under the skin. If you get this medicine at home, you will be taught how to prepare and give this medicine. Refer to the Instructions for Use that come with your medication packaging. Use exactly as directed. Take your medicine at regular intervals. Do not take your medicine more often than directed. It is important that you put your used needles and syringes in a special sharps container. Do not put them in a trash can. If you do not have a sharps container, call your pharmacist or healthcare provider to get one. Talk to your pediatrician regarding the use of this medicine in children. Special care may be needed. Overdosage: If you think you have taken too much of this medicine contact a poison control center or emergency room at once. NOTE: This medicine is only for you. Do not share this medicine with others. What if I miss a dose? It is important not to miss your dose. Call your doctor or health care professional if you miss a  dose. What may interact with this medicine? This medicine may interact with the following medications: -medicines that may cause a release of neutrophils, such as lithium This list may not describe all possible interactions. Give your health care provider a list of all the medicines, herbs, non-prescription drugs, or dietary supplements you use. Also tell them if you smoke, drink alcohol, or use illegal drugs. Some items may interact with your medicine. What should I watch for while using this medicine? You may need blood work done while you are taking this medicine. What side effects may I notice from receiving this medicine? Side effects that you should report to your doctor or health care professional as soon as possible: -allergic reactions like skin rash, itching or hives, swelling of the face, lips, or tongue -blood in the urine -dark urine -dizziness -fast heartbeat -feeling faint -shortness of breath or breathing problems -signs and symptoms of infection like fever or chills; cough; or sore throat -signs and symptoms of kidney injury like trouble passing urine or change in the amount of urine -stomach or side pain, or pain at the shoulder -sweating -swelling of the legs, ankles, or abdomen -tiredness Side effects that usually do not require medical attention (report to your doctor or health care professional if they continue or are bothersome): -bone pain -headache -muscle pain -vomiting This list may not describe all possible side effects. Call your doctor for medical advice about side effects. You may report side effects to FDA at 1-800-FDA-1088. Where should I keep my medicine? Keep out of the reach of children. Store in a refrigerator between   2 and 8 degrees C (36 and 46 degrees F). Keep in carton to protect from light. Throw away this medicine if it is left out of the refrigerator for more than 5 consecutive days. Throw away any unused medicine after the expiration  date. NOTE: This sheet is a summary. It may not cover all possible information. If you have questions about this medicine, talk to your doctor, pharmacist, or health care provider.  2018 Elsevier/Gold Standard (2015-03-02 19:07:04)  

## 2017-09-15 ENCOUNTER — Telehealth: Payer: Self-pay

## 2017-09-15 ENCOUNTER — Inpatient Hospital Stay: Payer: Medicaid Other

## 2017-09-15 ENCOUNTER — Encounter: Payer: Self-pay | Admitting: Nurse Practitioner

## 2017-09-15 ENCOUNTER — Inpatient Hospital Stay (HOSPITAL_BASED_OUTPATIENT_CLINIC_OR_DEPARTMENT_OTHER): Payer: Medicaid Other | Admitting: Nurse Practitioner

## 2017-09-15 VITALS — BP 128/41 | HR 73 | Temp 98.3°F | Resp 18 | Ht 59.0 in | Wt 126.2 lb

## 2017-09-15 DIAGNOSIS — T451X5A Adverse effect of antineoplastic and immunosuppressive drugs, initial encounter: Secondary | ICD-10-CM

## 2017-09-15 DIAGNOSIS — C773 Secondary and unspecified malignant neoplasm of axilla and upper limb lymph nodes: Secondary | ICD-10-CM

## 2017-09-15 DIAGNOSIS — I10 Essential (primary) hypertension: Secondary | ICD-10-CM

## 2017-09-15 DIAGNOSIS — E876 Hypokalemia: Secondary | ICD-10-CM

## 2017-09-15 DIAGNOSIS — Z170 Estrogen receptor positive status [ER+]: Secondary | ICD-10-CM

## 2017-09-15 DIAGNOSIS — C50212 Malignant neoplasm of upper-inner quadrant of left female breast: Secondary | ICD-10-CM

## 2017-09-15 DIAGNOSIS — D6481 Anemia due to antineoplastic chemotherapy: Secondary | ICD-10-CM | POA: Diagnosis not present

## 2017-09-15 DIAGNOSIS — D701 Agranulocytosis secondary to cancer chemotherapy: Secondary | ICD-10-CM | POA: Diagnosis not present

## 2017-09-15 DIAGNOSIS — E1142 Type 2 diabetes mellitus with diabetic polyneuropathy: Secondary | ICD-10-CM | POA: Diagnosis not present

## 2017-09-15 DIAGNOSIS — Z7982 Long term (current) use of aspirin: Secondary | ICD-10-CM | POA: Diagnosis not present

## 2017-09-15 DIAGNOSIS — Z9221 Personal history of antineoplastic chemotherapy: Secondary | ICD-10-CM | POA: Diagnosis not present

## 2017-09-15 DIAGNOSIS — Z5111 Encounter for antineoplastic chemotherapy: Secondary | ICD-10-CM | POA: Diagnosis not present

## 2017-09-15 DIAGNOSIS — S82142S Displaced bicondylar fracture of left tibia, sequela: Secondary | ICD-10-CM

## 2017-09-15 DIAGNOSIS — S82142A Displaced bicondylar fracture of left tibia, initial encounter for closed fracture: Secondary | ICD-10-CM | POA: Diagnosis not present

## 2017-09-15 DIAGNOSIS — Z95828 Presence of other vascular implants and grafts: Secondary | ICD-10-CM

## 2017-09-15 LAB — CBC WITH DIFFERENTIAL (CANCER CENTER ONLY)
Basophils Absolute: 0 10*3/uL (ref 0.0–0.1)
Basophils Relative: 0 %
EOS PCT: 2 %
Eosinophils Absolute: 0.1 10*3/uL (ref 0.0–0.5)
HEMATOCRIT: 24.3 % — AB (ref 34.8–46.6)
HEMOGLOBIN: 8.2 g/dL — AB (ref 11.6–15.9)
Lymphocytes Relative: 27 %
Lymphs Abs: 0.9 10*3/uL (ref 0.9–3.3)
MCH: 34.9 pg — AB (ref 25.1–34.0)
MCHC: 33.7 g/dL (ref 31.5–36.0)
MCV: 103.4 fL — ABNORMAL HIGH (ref 79.5–101.0)
MONO ABS: 0.5 10*3/uL (ref 0.1–0.9)
MONOS PCT: 15 %
NEUTROS ABS: 1.9 10*3/uL (ref 1.5–6.5)
Neutrophils Relative %: 56 %
Platelet Count: 162 10*3/uL (ref 145–400)
RBC: 2.35 MIL/uL — ABNORMAL LOW (ref 3.70–5.45)
RDW: 21.5 % — AB (ref 11.2–14.5)
WBC Count: 3.4 10*3/uL — ABNORMAL LOW (ref 3.9–10.3)
nRBC: 3 /100 WBC — ABNORMAL HIGH

## 2017-09-15 LAB — CMP (CANCER CENTER ONLY)
ALK PHOS: 100 U/L (ref 38–126)
ALT: 19 U/L (ref 0–44)
AST: 22 U/L (ref 15–41)
Albumin: 3.6 g/dL (ref 3.5–5.0)
Anion gap: 10 (ref 5–15)
BILIRUBIN TOTAL: 0.3 mg/dL (ref 0.3–1.2)
BUN: 4 mg/dL — ABNORMAL LOW (ref 8–23)
CALCIUM: 8.8 mg/dL — AB (ref 8.9–10.3)
CO2: 25 mmol/L (ref 22–32)
Chloride: 105 mmol/L (ref 98–111)
Creatinine: 0.62 mg/dL (ref 0.44–1.00)
GFR, Estimated: >60 mL/min (ref >60)
Glucose, Bld: 185 mg/dL — ABNORMAL HIGH (ref 70–99)
POTASSIUM: 3.1 mmol/L — AB (ref 3.5–5.1)
SODIUM: 140 mmol/L (ref 135–145)
TOTAL PROTEIN: 6.8 g/dL (ref 6.5–8.1)

## 2017-09-15 MED ORDER — SODIUM CHLORIDE 0.9% FLUSH
10.0000 mL | INTRAVENOUS | Status: DC | PRN
  Administered 2017-09-15: 10 mL
  Filled 2017-09-15: qty 10

## 2017-09-15 MED ORDER — POTASSIUM CHLORIDE CRYS ER 20 MEQ PO TBCR
20.0000 meq | EXTENDED_RELEASE_TABLET | Freq: Once | ORAL | Status: AC
  Administered 2017-09-15: 20 meq via ORAL

## 2017-09-15 MED ORDER — SODIUM CHLORIDE 0.9 % IV SOLN
80.0000 mg/m2 | Freq: Once | INTRAVENOUS | Status: AC
  Administered 2017-09-15: 126 mg via INTRAVENOUS
  Filled 2017-09-15: qty 21

## 2017-09-15 MED ORDER — DIPHENHYDRAMINE HCL 50 MG/ML IJ SOLN
INTRAMUSCULAR | Status: AC
  Filled 2017-09-15: qty 1

## 2017-09-15 MED ORDER — POTASSIUM CHLORIDE CRYS ER 20 MEQ PO TBCR
EXTENDED_RELEASE_TABLET | ORAL | Status: AC
  Filled 2017-09-15: qty 1

## 2017-09-15 MED ORDER — FAMOTIDINE IN NACL 20-0.9 MG/50ML-% IV SOLN
INTRAVENOUS | Status: AC
  Filled 2017-09-15: qty 50

## 2017-09-15 MED ORDER — DEXAMETHASONE SODIUM PHOSPHATE 10 MG/ML IJ SOLN
INTRAMUSCULAR | Status: AC
  Filled 2017-09-15: qty 1

## 2017-09-15 MED ORDER — SODIUM CHLORIDE 0.9 % IV SOLN
139.3500 mg | Freq: Once | INTRAVENOUS | Status: AC
  Administered 2017-09-15: 140 mg via INTRAVENOUS
  Filled 2017-09-15: qty 14

## 2017-09-15 MED ORDER — HEPARIN SOD (PORK) LOCK FLUSH 100 UNIT/ML IV SOLN
500.0000 [IU] | Freq: Once | INTRAVENOUS | Status: AC | PRN
  Administered 2017-09-15: 500 [IU]
  Filled 2017-09-15: qty 5

## 2017-09-15 MED ORDER — DIPHENHYDRAMINE HCL 50 MG/ML IJ SOLN
50.0000 mg | Freq: Once | INTRAMUSCULAR | Status: AC
  Administered 2017-09-15: 50 mg via INTRAVENOUS

## 2017-09-15 MED ORDER — FAMOTIDINE IN NACL 20-0.9 MG/50ML-% IV SOLN
20.0000 mg | Freq: Once | INTRAVENOUS | Status: AC
  Administered 2017-09-15: 20 mg via INTRAVENOUS

## 2017-09-15 MED ORDER — SODIUM CHLORIDE 0.9 % IV SOLN
Freq: Once | INTRAVENOUS | Status: AC
  Administered 2017-09-15: 12:00:00 via INTRAVENOUS
  Filled 2017-09-15: qty 250

## 2017-09-15 MED ORDER — DEXAMETHASONE SODIUM PHOSPHATE 10 MG/ML IJ SOLN
10.0000 mg | Freq: Once | INTRAMUSCULAR | Status: AC
  Administered 2017-09-15: 10 mg via INTRAVENOUS

## 2017-09-15 NOTE — Patient Instructions (Addendum)
[facility] Discharge Instructions for Patients Receiving Chemotherapy  Today you received the following chemotherapy agents :  Taxol, Carboplatin.  To help prevent nausea and vomiting after your treatment, we encourage you to take your nausea medication as prescribed.   If you develop nausea and vomiting that is not controlled by your nausea medication, call the clinic.   BELOW ARE SYMPTOMS THAT SHOULD BE REPORTED IMMEDIATELY:  *FEVER GREATER THAN 100.5 F  *CHILLS WITH OR WITHOUT FEVER  NAUSEA AND VOMITING THAT IS NOT CONTROLLED WITH YOUR NAUSEA MEDICATION  *UNUSUAL SHORTNESS OF BREATH  *UNUSUAL BRUISING OR BLEEDING  TENDERNESS IN MOUTH AND THROAT WITH OR WITHOUT PRESENCE OF ULCERS  *URINARY PROBLEMS  *BOWEL PROBLEMS  UNUSUAL RASH Items with * indicate a potential emergency and should be followed up as soon as possible.  Feel free to call the clinic should you have any questions or concerns. The clinic phone number is [phone].  Please show the [facility] at check-in to the Emergency Department and triage nurse.   Hypokalemia Hypokalemia means that the amount of potassium in the blood is lower than normal.Potassium is a chemical that helps regulate the amount of fluid in the body (electrolyte). It also stimulates muscle tightening (contraction) and helps nerves work properly.Normally, most of the body's potassium is inside of cells, and only a very small amount is in the blood. Because the amount in the blood is so small, minor changes to potassium levels in the blood can be life-threatening. What are the causes? This condition may be caused by:  Antibiotic medicine.  Diarrhea or vomiting. Taking too much of a medicine that helps you have a bowel movement (laxative) can cause diarrhea and lead to hypokalemia.  Chronic kidney disease (CKD).  Medicines that help the body get rid of excess fluid (diuretics).  Eating disorders, such as  bulimia.  Low magnesium levels in the body.  Sweating a lot.  What are the signs or symptoms? Symptoms of this condition include:  Weakness.  Constipation.  Fatigue.  Muscle cramps.  Mental confusion.  Skipped heartbeats or irregular heartbeat (palpitations).  Tingling or numbness.  How is this diagnosed? This condition is diagnosed with a blood test. How is this treated? Hypokalemia can be treated by taking potassium supplements by mouth or adjusting the medicines that you take. Treatment may also include eating more foods that contain a lot of potassium. If your potassium level is very low, you may need to get potassium through an IV tube in one of your veins and be monitored in the hospital. Follow these instructions at home:  Take over-the-counter and prescription medicines only as told by your health care provider. This includes vitamins and supplements.  Eat a healthy diet. A healthy diet includes fresh fruits and vegetables, whole grains, healthy fats, and lean proteins.  If instructed, eat more foods that contain a lot of potassium, such as: ? Nuts, such as peanuts and pistachios. ? Seeds, such as sunflower seeds and pumpkin seeds. ? Peas, lentils, and lima beans. ? Whole grain and bran cereals and breads. ? Fresh fruits and vegetables, such as apricots, avocado, bananas, cantaloupe, kiwi, oranges, tomatoes, asparagus, and potatoes. ? Orange juice. ? Tomato juice. ? Red meats. ? Yogurt.  Keep all follow-up visits as told by your health care provider. This is important. Contact a health care provider if:  You have weakness that gets worse.  You feel your heart pounding or racing.  You vomit.  You  have diarrhea.  You have diabetes (diabetes mellitus) and you have trouble keeping your blood sugar (glucose) in your target range. Get help right away if:  You have chest pain.  You have shortness of breath.  You have vomiting or diarrhea that lasts for  more than 2 days.  You faint. This information is not intended to replace advice given to you by your health care provider. Make sure you discuss any questions you have with your health care provider. Document Released: 01/10/2005 Document Revised: 08/29/2015 Document Reviewed: 08/29/2015 Elsevier Interactive Patient Education  2018 [employer].

## 2017-09-15 NOTE — Telephone Encounter (Signed)
Printed avs and calender of upcoming appointment. Per 8/23 los 
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
## 2017-09-15 NOTE — Patient Instructions (Signed)
Hipokalemia (Hypokalemia) Hipokalemia significa que el nivel de potasio en sangre es menor que lo normal. El potasio es un electrolito que ayuda a regular la cantidad de lquido del organismo. Tambin estimula la contraccin muscular y ayuda a que la funcin muscular sea la [place]. La Delorise Shiner del potasio del organismo se encuentra dentro de las clulas y slo una pequea cantidad en la sangre. Debido a que la cantidad en la sangre es muy pequea, pequeos cambios en la sangre pueden poner en peligro la vida. CAUSAS  Antibiticos.  Diarrea o vmitos.  El uso excesivo de laxantes, lo que puede causar diarrea.  Enfermedad renal crnica.  Uso de diurticos.  Trastornos de Youth worker (bulimia).  Bajos niveles de magnesio.  Sudoracin abundante. Drew.  Estreimiento.  Fatiga.  Calambres musculares.  Confusin mental.  Latidos cardacos salteados o irregulares (palpitaciones).  Hormigueo o adormecimiento. DIAGNSTICO El mdico puede diagnosticar hipokalemia por los anlisis de Roxie. Adems para controlar sus niveles de potasio, el mdico podr ordenar otros anlisis de laboratorio. TRATAMIENTO La hipokalemia puede tratarse con suplementos de potasio por va oral o realizando ajustes en sus medicamentos habituales. Si sus niveles de potasio son muy bajos, ser necesario que lo reciba a travs de una vena (IV) y se lo controle en el hospital. Ardelia Mems dieta rica en potasio tambin puede ser de [place]. Los alimentos ricos en potasio son:  [place] Pert secos, como cacahuetes y pistachos.  Semillas, como semillas de girasol y de Scientific laboratory technician.  Porotos, guisantes secos y lentejas.  Granos enteros y panes y cereales con salvado.  Lambert Mody y vegetales frescos como damascos, avocado, bananas, meln, kiwi, naranjas, esprragos y patatas.  Jugos de naranja y tomates.  Carnes rojas.  Yogur con frutas. INSTRUCCIONES PARA EL CUIDADO EN EL HOGAR  Tome todos los  [employer] indic el mdico.  Siga una dieta saludable e incluya alimentos nutritivos como frutas, vegetales, nueces, granos enteros y carnes [place].  Si est tomando laxantes, asegrese de seguir las instrucciones del envase. SOLICITE ATENCIN MDICA SI:  La debilidad empeora.  Siente que el corazn late fuerte o est acelerado.  Vomita o tiene diarrea.  Tiene problemas para mantener su nivel de glucosa en el rango normal. SOLICITE ATENCIN MDICA DE INMEDIATO SI:  Siente dolor en el pecho, le falta de aire o se siente mareado.  Vomita o tiene diarrea durante ms de 2 das.  Se desmaya. ASEGRESE DE QUE:  Comprende estas instrucciones.  Controlar su afeccin.  Recibir ayuda de inmediato si no mejora o si empeora. Esta informacin no tiene Marine scientist el consejo del mdico. Asegrese de hacerle al mdico cualquier pregunta que tenga. Document Released: 01/10/2005 Document Revised: 01/31/2014 Document Reviewed: 07/22/2015 Elsevier Interactive Patient Education  [employer].

## 2017-09-19 ENCOUNTER — Encounter: Payer: Self-pay | Admitting: Pharmacy Technician

## 2017-09-19 NOTE — Progress Notes (Signed)
The patient is approved for drug assistance by [employer] for Granix 300 mcg. Enrollment is based on self pay and is effective until 09/19/18. Drug replacement will be requested for the first DOS 09/13/17.

## 2017-09-22 ENCOUNTER — Inpatient Hospital Stay (HOSPITAL_BASED_OUTPATIENT_CLINIC_OR_DEPARTMENT_OTHER): Payer: Medicaid Other | Admitting: Hematology

## 2017-09-22 ENCOUNTER — Inpatient Hospital Stay: Payer: Medicaid Other

## 2017-09-22 ENCOUNTER — Encounter: Payer: Self-pay | Admitting: Hematology

## 2017-09-22 ENCOUNTER — Telehealth: Payer: Self-pay | Admitting: Hematology

## 2017-09-22 VITALS — BP 118/37 | HR 77 | Temp 98.0°F | Resp 18 | Ht 59.0 in | Wt 125.2 lb

## 2017-09-22 DIAGNOSIS — Z9221 Personal history of antineoplastic chemotherapy: Secondary | ICD-10-CM

## 2017-09-22 DIAGNOSIS — C50212 Malignant neoplasm of upper-inner quadrant of left female breast: Secondary | ICD-10-CM

## 2017-09-22 DIAGNOSIS — T451X5A Adverse effect of antineoplastic and immunosuppressive drugs, initial encounter: Secondary | ICD-10-CM

## 2017-09-22 DIAGNOSIS — E876 Hypokalemia: Secondary | ICD-10-CM | POA: Diagnosis not present

## 2017-09-22 DIAGNOSIS — Z170 Estrogen receptor positive status [ER+]: Secondary | ICD-10-CM

## 2017-09-22 DIAGNOSIS — E118 Type 2 diabetes mellitus with unspecified complications: Secondary | ICD-10-CM

## 2017-09-22 DIAGNOSIS — E1142 Type 2 diabetes mellitus with diabetic polyneuropathy: Secondary | ICD-10-CM

## 2017-09-22 DIAGNOSIS — Z7982 Long term (current) use of aspirin: Secondary | ICD-10-CM | POA: Diagnosis not present

## 2017-09-22 DIAGNOSIS — C773 Secondary and unspecified malignant neoplasm of axilla and upper limb lymph nodes: Secondary | ICD-10-CM | POA: Diagnosis not present

## 2017-09-22 DIAGNOSIS — S82142A Displaced bicondylar fracture of left tibia, initial encounter for closed fracture: Secondary | ICD-10-CM | POA: Diagnosis not present

## 2017-09-22 DIAGNOSIS — D701 Agranulocytosis secondary to cancer chemotherapy: Secondary | ICD-10-CM | POA: Diagnosis not present

## 2017-09-22 DIAGNOSIS — I10 Essential (primary) hypertension: Secondary | ICD-10-CM | POA: Diagnosis not present

## 2017-09-22 DIAGNOSIS — S82142S Displaced bicondylar fracture of left tibia, sequela: Secondary | ICD-10-CM

## 2017-09-22 DIAGNOSIS — Z95828 Presence of other vascular implants and grafts: Secondary | ICD-10-CM

## 2017-09-22 DIAGNOSIS — D6481 Anemia due to antineoplastic chemotherapy: Secondary | ICD-10-CM

## 2017-09-22 DIAGNOSIS — Z5111 Encounter for antineoplastic chemotherapy: Secondary | ICD-10-CM | POA: Diagnosis not present

## 2017-09-22 LAB — CMP (CANCER CENTER ONLY)
ALK PHOS: 89 U/L (ref 38–126)
ALT: 18 U/L (ref 0–44)
AST: 31 U/L (ref 15–41)
Albumin: 3.7 g/dL (ref 3.5–5.0)
Anion gap: 11 (ref 5–15)
BILIRUBIN TOTAL: 0.4 mg/dL (ref 0.3–1.2)
BUN: 7 mg/dL — ABNORMAL LOW (ref 8–23)
CALCIUM: 9 mg/dL (ref 8.9–10.3)
CO2: 26 mmol/L (ref 22–32)
Chloride: 104 mmol/L (ref 98–111)
Creatinine: 0.63 mg/dL (ref 0.44–1.00)
GFR, Estimated: >60 mL/min (ref >60)
Glucose, Bld: 171 mg/dL — ABNORMAL HIGH (ref 70–99)
Potassium: 3.9 mmol/L (ref 3.5–5.1)
Sodium: 141 mmol/L (ref 135–145)
TOTAL PROTEIN: 7 g/dL (ref 6.5–8.1)

## 2017-09-22 LAB — CBC WITH DIFFERENTIAL (CANCER CENTER ONLY)
BASOS ABS: 0 10*3/uL (ref 0.0–0.1)
BASOS PCT: 0 %
Eosinophils Absolute: 0.1 10*3/uL (ref 0.0–0.5)
Eosinophils Relative: 2 %
HCT: 24.7 % — ABNORMAL LOW (ref 34.8–46.6)
HEMOGLOBIN: 8.1 g/dL — AB (ref 11.6–15.9)
Lymphocytes Relative: 26 %
Lymphs Abs: 1.1 10*3/uL (ref 0.9–3.3)
MCH: 35.4 pg — ABNORMAL HIGH (ref 25.1–34.0)
MCHC: 32.8 g/dL (ref 31.5–36.0)
MCV: 107.9 fL — ABNORMAL HIGH (ref 79.5–101.0)
MONO ABS: 0.5 10*3/uL (ref 0.1–0.9)
Monocytes Relative: 11 %
NEUTROS ABS: 2.5 10*3/uL (ref 1.5–6.5)
Neutrophils Relative %: 61 %
PLATELETS: 252 10*3/uL (ref 145–400)
RBC: 2.29 MIL/uL — ABNORMAL LOW (ref 3.70–5.45)
RDW: 21.2 % — ABNORMAL HIGH (ref 11.2–14.5)
WBC Count: 4.1 10*3/uL (ref 3.9–10.3)
nRBC: 3 /100 WBC — ABNORMAL HIGH

## 2017-09-22 MED ORDER — SODIUM CHLORIDE 0.9 % IV SOLN
80.0000 mg/m2 | Freq: Once | INTRAVENOUS | Status: AC
  Administered 2017-09-22: 126 mg via INTRAVENOUS
  Filled 2017-09-22: qty 21

## 2017-09-22 MED ORDER — DIPHENHYDRAMINE HCL 50 MG/ML IJ SOLN
50.0000 mg | Freq: Once | INTRAMUSCULAR | Status: AC
  Administered 2017-09-22: 50 mg via INTRAVENOUS

## 2017-09-22 MED ORDER — HEPARIN SOD (PORK) LOCK FLUSH 100 UNIT/ML IV SOLN
500.0000 [IU] | Freq: Once | INTRAVENOUS | Status: AC | PRN
  Administered 2017-09-22: 500 [IU]
  Filled 2017-09-22: qty 5

## 2017-09-22 MED ORDER — DEXAMETHASONE SODIUM PHOSPHATE 10 MG/ML IJ SOLN
INTRAMUSCULAR | Status: AC
  Filled 2017-09-22: qty 1

## 2017-09-22 MED ORDER — DEXAMETHASONE SODIUM PHOSPHATE 10 MG/ML IJ SOLN
10.0000 mg | Freq: Once | INTRAMUSCULAR | Status: AC
  Administered 2017-09-22: 10 mg via INTRAVENOUS

## 2017-09-22 MED ORDER — FAMOTIDINE IN NACL 20-0.9 MG/50ML-% IV SOLN
INTRAVENOUS | Status: AC
  Filled 2017-09-22: qty 50

## 2017-09-22 MED ORDER — FAMOTIDINE IN NACL 20-0.9 MG/50ML-% IV SOLN
20.0000 mg | Freq: Once | INTRAVENOUS | Status: AC
  Administered 2017-09-22: 20 mg via INTRAVENOUS

## 2017-09-22 MED ORDER — SODIUM CHLORIDE 0.9 % IV SOLN
Freq: Once | INTRAVENOUS | Status: AC
  Administered 2017-09-22: 11:00:00 via INTRAVENOUS
  Filled 2017-09-22: qty 250

## 2017-09-22 MED ORDER — SODIUM CHLORIDE 0.9 % IV SOLN
139.3500 mg | Freq: Once | INTRAVENOUS | Status: AC
  Administered 2017-09-22: 140 mg via INTRAVENOUS
  Filled 2017-09-22: qty 14

## 2017-09-22 MED ORDER — DIPHENHYDRAMINE HCL 50 MG/ML IJ SOLN
INTRAMUSCULAR | Status: AC
  Filled 2017-09-22: qty 1

## 2017-09-22 MED ORDER — SODIUM CHLORIDE 0.9% FLUSH
10.0000 mL | INTRAVENOUS | Status: DC | PRN
  Administered 2017-09-22: 10 mL
  Filled 2017-09-22: qty 10
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
## 2017-09-22 NOTE — Progress Notes (Signed)
Dr Burr Medico aware of HGB 8.1 ok to tx no new orders. Pt asymptomatic, no PRBC transfusion necessary.

## 2017-09-22 NOTE — Patient Instructions (Signed)
[facility] Discharge Instructions for Patients Receiving Chemotherapy  Today you received the following chemotherapy agents:  Taxol, Carboplatin  To help prevent nausea and vomiting after your treatment, we encourage you to take your nausea medication as prescribed.   If you develop nausea and vomiting that is not controlled by your nausea medication, call the clinic.   BELOW ARE SYMPTOMS THAT SHOULD BE REPORTED IMMEDIATELY:  *FEVER GREATER THAN 100.5 F  *CHILLS WITH OR WITHOUT FEVER  NAUSEA AND VOMITING THAT IS NOT CONTROLLED WITH YOUR NAUSEA MEDICATION  *UNUSUAL SHORTNESS OF BREATH  *UNUSUAL BRUISING OR BLEEDING  TENDERNESS IN MOUTH AND THROAT WITH OR WITHOUT PRESENCE OF ULCERS  *URINARY PROBLEMS  *BOWEL PROBLEMS  UNUSUAL RASH Items with * indicate a potential emergency and should be followed up as soon as possible.  Feel free to call the clinic should you have any questions or concerns. The clinic phone number is [phone].  Please show the CHEMO ALERT CARD at check-in to the Emergency Department and triage nurse.   

## 2017-09-22 NOTE — Telephone Encounter (Signed)
Breast MRI in 5 weeks  Per 8/30 los

## 2017-09-26 ENCOUNTER — Ambulatory Visit: Payer: Medicaid Other | Attending: Nurse Practitioner | Admitting: Nurse Practitioner

## 2017-09-26 ENCOUNTER — Encounter: Payer: Self-pay | Admitting: Nurse Practitioner

## 2017-09-26 VITALS — BP 145/78 | HR 74 | Temp 98.5°F | Ht 59.0 in | Wt 123.8 lb

## 2017-09-26 DIAGNOSIS — E785 Hyperlipidemia, unspecified: Secondary | ICD-10-CM | POA: Diagnosis not present

## 2017-09-26 DIAGNOSIS — Z833 Family history of diabetes mellitus: Secondary | ICD-10-CM | POA: Insufficient documentation

## 2017-09-26 DIAGNOSIS — E118 Type 2 diabetes mellitus with unspecified complications: Secondary | ICD-10-CM | POA: Diagnosis not present

## 2017-09-26 DIAGNOSIS — I10 Essential (primary) hypertension: Secondary | ICD-10-CM

## 2017-09-26 DIAGNOSIS — C50912 Malignant neoplasm of unspecified site of left female breast: Secondary | ICD-10-CM | POA: Diagnosis not present

## 2017-09-26 DIAGNOSIS — Z79899 Other long term (current) drug therapy: Secondary | ICD-10-CM | POA: Insufficient documentation

## 2017-09-26 DIAGNOSIS — Z8781 Personal history of (healed) traumatic fracture: Secondary | ICD-10-CM | POA: Insufficient documentation

## 2017-09-26 DIAGNOSIS — Z7984 Long term (current) use of oral hypoglycemic drugs: Secondary | ICD-10-CM | POA: Insufficient documentation

## 2017-09-26 DIAGNOSIS — Z0000 Encounter for general adult medical examination without abnormal findings: Secondary | ICD-10-CM

## 2017-09-26 DIAGNOSIS — E781 Pure hyperglyceridemia: Secondary | ICD-10-CM

## 2017-09-26 LAB — GLUCOSE, POCT (MANUAL RESULT ENTRY): POC GLUCOSE: 101 mg/dL — AB (ref 70–99)

## 2017-09-26 LAB — POCT GLYCOSYLATED HEMOGLOBIN (HGB A1C): HEMOGLOBIN A1C: 6.2 % — AB (ref 4.0–5.6)

## 2017-09-26 MED ORDER — SITAGLIPTIN PHOS-METFORMIN HCL 50-1000 MG PO TABS: 1.0000 | ORAL_TABLET | Freq: Two times a day (BID) | ORAL | 1 refills | Status: DC

## 2017-09-26 MED ORDER — GLIMEPIRIDE 4 MG PO TABS: 4.0000 mg | ORAL_TABLET | Freq: Every day | ORAL | 2 refills | Status: DC

## 2017-09-26 MED ORDER — ATORVASTATIN CALCIUM 20 MG PO TABS: 20.0000 mg | ORAL_TABLET | Freq: Every day | ORAL | 3 refills | Status: DC

## 2017-09-26 MED ORDER — LOSARTAN POTASSIUM 50 MG PO TABS: 50.0000 mg | ORAL_TABLET | Freq: Every day | ORAL | 3 refills | Status: DC

## 2017-09-26 MED ORDER — HYDROCHLOROTHIAZIDE 25 MG PO TABS: 25.0000 mg | ORAL_TABLET | Freq: Every day | ORAL | 3 refills | Status: DC

## 2017-09-26 NOTE — Progress Notes (Signed)
Assessment & Plan:  Amanda Duarte was seen today for follow-up.  Diagnoses and all orders for this visit:  Essential hypertension -     hydrochlorothiazide (HYDRODIURIL) 25 MG tablet; Take 1 tablet (25 mg total) by mouth daily. -     losartan (COZAAR) 50 MG tablet; Take 1 tablet (50 mg total) by mouth daily. Continue all antihypertensives as prescribed.  Remember to bring in your blood pressure log with you for your follow up appointment.  DASH/Mediterranean Diets are healthier choices for HTN.    Type 2 diabetes mellitus with complication, without long-term current use of insulin (HCC) -     Glucose (CBG) -     HgB A1c -     glimepiride (AMARYL) 4 MG tablet; Take 1 tablet (4 mg total) by mouth daily before breakfast. -     sitaGLIPtin-metformin (JANUMET) 50-1000 MG tablet; Take 1 tablet by mouth 2 (two) times daily with a meal. Continue blood sugar control as discussed in office today, low carbohydrate diet, and regular physical exercise as tolerated, 150 minutes per week (30 min each day, 5 days per week, or 50 min 3 days per week). Keep blood sugar logs with fasting goal of 90-130 mg/dl, post prandial (after you eat) less than 180.  For Hypoglycemia: BS <60 and Hyperglycemia BS >400; contact the clinic ASAP. Annual eye exams and foot exams are recommended.   Pure hypertriglyceridemia -     atorvastatin (LIPITOR) 20 MG tablet; Take 1 tablet (20 mg total) by mouth daily. -     Lipid panel INSTRUCTIONS: Work on a low fat, heart healthy diet and participate in regular aerobic exercise program by working out at least 150 minutes per week; 5 days a week-30 minutes per day. Avoid red meat, fried foods. junk foods, sodas, sugary drinks, unhealthy snacking, alcohol and smoking.  Drink at least 48oz of water per day and monitor your carbohydrate intake daily.    Routine adult health maintenance -     Hepatitis C Antibody    Patient has been counseled on age-appropriate routine health concerns  for screening and prevention. These are reviewed and up-to-date. Referrals have been placed accordingly. Immunizations are up-to-date or declined.    Subjective:   Chief Complaint  Patient presents with  . Follow-up    Pt. is here to follow-up on diabetes, hypertension, and cholesterol.   HPI Amanda Duarte 62 y.o. female presents to office today for follow up of DM, HTN and HPL. VRI was used to communicate directly with patient for the entire encounter including providing detailed patient instructions.  She is currently seeing oncology for left breast cancer. She has started Chemotherapy.   Hypertension Chronic. Elevated today however has been on the lower side at her oncology visits. She states she has not eaten today and this may be contributing to her elevated readings.  I will not make any adjustments with her antihypertensives.  She does not have a blood pressure log  today.  Blood pressure is not well controlled at home.  Cardiac symptoms none. Patient denies chest pain, chest pressure/discomfort, claudication, dyspnea, fatigue, lower extremity edema, palpitations and paroxysmal nocturnal dyspnea.  Cardiovascular risk factors: diabetes mellitus, dyslipidemia, hypertension and obesity (BMI >= 30 kg/m2).  BP Readings from Last 3 Encounters:  09/26/17 (!) 145/78  09/22/17 (!) 118/37  09/15/17 (!) 128/41    Hyperlipidemia Patient presents for follow up to hyperlipidemia.  She is not medication compliant and has not been taking atorvastatin. She is diet compliant  and denies skin xanthelasma or statin intolerance including myalgias.  Lab Results  Component Value Date   CHOL 198 09/26/2017   Lab Results  Component Value Date   HDL 40 09/26/2017   Lab Results  Component Value Date   LDLCALC Comment 09/26/2017   Lab Results  Component Value Date   TRIG 479 (H) 09/26/2017   Lab Results  Component Value Date   CHOLHDL 5.0 (H) 09/26/2017      Diabetes Mellitus Type  II Chronic and well controlled. She endorses medication compliance taking amaryl 4 mg and janumet 50-1000 mg BID.   She is currently taking an angiotensin II receptor blocker. Denies any symptoms of hypo or hyperglycemia. She has her Diabetes log with her today. Average readings 140-160s.   Lab Results  Component Value Date   HGBA1C 6.2 (A) 09/26/2017   HGBA1C 9.2 (A) 06/26/2017   HGBA1C 9.7 03/29/2017    Review of Systems  Constitutional: Negative for fever, malaise/fatigue and weight loss.  HENT: Negative.  Negative for nosebleeds.   Eyes: Negative.  Negative for blurred vision, double vision and photophobia.  Respiratory: Negative.  Negative for cough and shortness of breath.   Cardiovascular: Negative.  Negative for chest pain, palpitations and leg swelling.  Gastrointestinal: Negative.  Negative for heartburn, nausea and vomiting.  Musculoskeletal: Negative.  Negative for myalgias.  Neurological: Negative.  Negative for dizziness, focal weakness, seizures and headaches.  Psychiatric/Behavioral: Negative.  Negative for suicidal ideas.    Past Medical History:  Diagnosis Date  . Cancer ([place]) 03/2017   left breast cancer  . Diabetes mellitus without complication ([place])   . Hyperlipidemia   . Hypertension   . Tibial plateau fracture, left    04-13-17 had ORIF    Past Surgical History:  Procedure Laterality Date  . ORIF TIBIA PLATEAU Left 04/13/2017   Procedure: OPEN REDUCTION INTERNAL FIXATION (ORIF) TIBIAL PLATEAU;  Surgeon: Shona Needles, MD;  Location: [facility];  Service: Orthopedics;  Laterality: Left;  . PORTACATH PLACEMENT Right 05/31/2017   Procedure: INSERTION PORT-A-CATH;  Surgeon: Fanny Skates, MD;  Location: [facility];  Service: General;  Laterality: Right;    Family History  Problem Relation Age of Onset  . Diabetes Son   . Breast cancer Neg Hx     Social History Reviewed with no changes to be made today.   Outpatient Medications Prior to  Visit  Medication Sig Dispense Refill  . cyclobenzaprine (FLEXERIL) 5 MG tablet Take 1 tablet (5 mg total) by mouth 3 (three) times daily as needed for muscle spasms. 30 tablet 1  . ondansetron (ZOFRAN) 8 MG tablet Take 1 tablet (8 mg total) by mouth 2 (two) times daily as needed. Start on the third day after chemotherapy. 30 tablet 1  . prochlorperazine (COMPAZINE) 10 MG tablet Take 1 tablet (10 mg total) by mouth every 6 (six) hours as needed (Nausea or vomiting). 30 tablet 1  . atorvastatin (LIPITOR) 20 MG tablet Take 1 tablet (20 mg total) by mouth daily. 30 tablet 3  . hydrochlorothiazide (HYDRODIURIL) 25 MG tablet Take 1 tablet (25 mg total) by mouth daily. 30 tablet 3  . losartan (COZAAR) 50 MG tablet Take 1 tablet (50 mg total) by mouth daily. 30 tablet 3  . Blood Glucose Monitoring Suppl (ONE TOUCH ULTRA 2) w/Device KIT Please check blood sugar by fingerstick once in the morning before you eat and once in the evening 1 hour after you eat. (Patient not taking: Reported  on 09/26/2017) 1 each 0  . glucose blood test strip Use as instructed (Patient not taking: Reported on 09/26/2017) 100 each 12  . HYDROcodone-acetaminophen (NORCO) 5-325 MG tablet Take 1-2 tablets by mouth every 6 (six) hours as needed for moderate pain or severe pain. (Patient not taking: Reported on 09/26/2017) 30 tablet 0  . lidocaine-prilocaine (EMLA) cream Apply to affected area once (Patient not taking: Reported on 09/26/2017) 30 g 3  . LORazepam (ATIVAN) 0.5 MG tablet Take 1 tablet (0.5 mg total) by mouth every 6 (six) hours as needed (Nausea or vomiting). (Patient not taking: Reported on 09/26/2017) 30 tablet 0  . senna (SENOKOT) 8.6 MG TABS tablet Take 1 tablet (8.6 mg total) by mouth daily as needed for mild constipation. (Patient not taking: Reported on 09/26/2017) 10 each 0  . glimepiride (AMARYL) 4 MG tablet Take 1 tablet (4 mg total) by mouth daily before breakfast. 90 tablet 2  . sitaGLIPtin-metformin (JANUMET) 50-1000 MG  tablet Take 1 tablet by mouth 2 (two) times daily with a meal. 180 tablet 1   No facility-administered medications prior to visit.     No Known Allergies     Objective:    BP (!) 145/78 (BP Location: Left Arm, Patient Position: Sitting, Cuff Size: Normal)   Pulse 74   Temp 98.5 F (36.9 C) (Oral)   Ht 4' 11"  (1.499 m)   Wt 123 lb 12.8 oz (56.2 kg)   SpO2 97%   BMI 25.00 kg/m  Wt Readings from Last 3 Encounters:  09/26/17 123 lb 12.8 oz (56.2 kg)  09/22/17 125 lb 3.2 oz (56.8 kg)  09/15/17 126 lb 3.2 oz (57.2 kg)    Physical Exam  Constitutional: She is oriented to person, place, and time. She appears well-developed and well-nourished. She is cooperative.  HENT:  Head: Normocephalic and atraumatic.  Eyes: EOM are normal.  Neck: Normal range of motion.  Cardiovascular: Normal rate, regular rhythm and normal heart sounds. Exam reveals no gallop and no friction rub.  No murmur heard. Pulmonary/Chest: Effort normal and breath sounds normal. No tachypnea. No respiratory distress. She has no decreased breath sounds. She has no wheezes. She has no rhonchi. She has no rales. She exhibits no tenderness.  Abdominal: Bowel sounds are normal.  Musculoskeletal: Normal range of motion. She exhibits no edema.  Neurological: She is alert and oriented to person, place, and time. Coordination normal.  Skin: Skin is warm and dry.  Psychiatric: She has a normal mood and affect. Her behavior is normal. Judgment and thought content normal.  Nursing note and vitals reviewed.      Patient has been counseled extensively about nutrition and exercise as well as the importance of adherence with medications and regular follow-up. The patient was given clear instructions to go to ER or return to medical center if symptoms don't improve, worsen or new problems develop. The patient verbalized understanding.   Follow-up: Return in about 3 months (around 12/26/2017) for HTN and PAP SMEAR.   [name], FNP-BC [facility] and [facility] [place], [place]   09/28/2017, 12:44 AM

## 2017-09-27 LAB — HEPATITIS C ANTIBODY

## 2017-09-27 LAB — LIPID PANEL
CHOL/HDL RATIO: 5 ratio — AB (ref 0.0–4.4)
CHOLESTEROL TOTAL: 198 mg/dL (ref 100–199)
HDL: 40 mg/dL (ref >39)
TRIGLYCERIDES: 479 mg/dL — AB (ref 0–149)

## 2017-09-28 ENCOUNTER — Telehealth: Payer: Self-pay

## 2017-09-28 ENCOUNTER — Encounter: Payer: Self-pay | Admitting: Nurse Practitioner

## 2017-09-28 MED ORDER — OMEGA-3-ACID ETHYL ESTERS 1 G PO CAPS: 1.0000 g | ORAL_CAPSULE | Freq: Two times a day (BID) | ORAL | 3 refills | Status: DC

## 2017-09-28 NOTE — Telephone Encounter (Signed)
-----   Message from [name], NP sent at 09/28/2017  1:05 AM EDT ----- Triglycerides are extremely elevated. I have sent an additional cholesterol medication for you to take with your atorvastatin. You will now be taking 2 cholesterol medications.

## 2017-09-28 NOTE — Telephone Encounter (Signed)
CMA attempt to reach patient to inform on lab results.  No answer and left a VM for patient to call back.  If patient call back, please inform: Triglycerides are extremely elevated. I have sent an additional cholesterol medication for you to take with your atorvastatin. You will now be taking 2 cholesterol medications.  A letter will be send out to reach patient.

## 2017-09-29 ENCOUNTER — Encounter: Payer: Self-pay | Admitting: Hematology

## 2017-09-29 ENCOUNTER — Inpatient Hospital Stay: Payer: Medicaid Other

## 2017-09-29 ENCOUNTER — Inpatient Hospital Stay: Payer: Medicaid Other | Attending: Nurse Practitioner

## 2017-09-29 ENCOUNTER — Inpatient Hospital Stay (HOSPITAL_BASED_OUTPATIENT_CLINIC_OR_DEPARTMENT_OTHER): Payer: Medicaid Other | Admitting: Hematology

## 2017-09-29 VITALS — BP 125/66 | HR 79 | Temp 98.4°F | Resp 18 | Ht 59.0 in | Wt 126.0 lb

## 2017-09-29 DIAGNOSIS — C50212 Malignant neoplasm of upper-inner quadrant of left female breast: Secondary | ICD-10-CM | POA: Diagnosis not present

## 2017-09-29 DIAGNOSIS — Z5111 Encounter for antineoplastic chemotherapy: Secondary | ICD-10-CM | POA: Diagnosis not present

## 2017-09-29 DIAGNOSIS — Z8781 Personal history of (healed) traumatic fracture: Secondary | ICD-10-CM

## 2017-09-29 DIAGNOSIS — E118 Type 2 diabetes mellitus with unspecified complications: Secondary | ICD-10-CM

## 2017-09-29 DIAGNOSIS — G629 Polyneuropathy, unspecified: Secondary | ICD-10-CM

## 2017-09-29 DIAGNOSIS — Z170 Estrogen receptor positive status [ER+]: Secondary | ICD-10-CM

## 2017-09-29 DIAGNOSIS — T451X5A Adverse effect of antineoplastic and immunosuppressive drugs, initial encounter: Secondary | ICD-10-CM

## 2017-09-29 DIAGNOSIS — E119 Type 2 diabetes mellitus without complications: Secondary | ICD-10-CM

## 2017-09-29 DIAGNOSIS — Z171 Estrogen receptor negative status [ER-]: Secondary | ICD-10-CM

## 2017-09-29 DIAGNOSIS — D6481 Anemia due to antineoplastic chemotherapy: Secondary | ICD-10-CM

## 2017-09-29 DIAGNOSIS — C773 Secondary and unspecified malignant neoplasm of axilla and upper limb lymph nodes: Secondary | ICD-10-CM | POA: Diagnosis not present

## 2017-09-29 DIAGNOSIS — I10 Essential (primary) hypertension: Secondary | ICD-10-CM

## 2017-09-29 DIAGNOSIS — Z95828 Presence of other vascular implants and grafts: Secondary | ICD-10-CM

## 2017-09-29 LAB — CMP (CANCER CENTER ONLY)
ALK PHOS: 77 U/L (ref 38–126)
ALT: 25 U/L (ref 0–44)
AST: 51 U/L — AB (ref 15–41)
Albumin: 3.7 g/dL (ref 3.5–5.0)
Anion gap: 9 (ref 5–15)
BILIRUBIN TOTAL: 0.4 mg/dL (ref 0.3–1.2)
BUN: 6 mg/dL — AB (ref 8–23)
CO2: 24 mmol/L (ref 22–32)
Calcium: 9.1 mg/dL (ref 8.9–10.3)
Chloride: 107 mmol/L (ref 98–111)
Creatinine: 0.64 mg/dL (ref 0.44–1.00)
GFR, Est AFR Am: >60 mL/min (ref >60)
Glucose, Bld: 174 mg/dL — ABNORMAL HIGH (ref 70–99)
POTASSIUM: 3.4 mmol/L — AB (ref 3.5–5.1)
Sodium: 140 mmol/L (ref 135–145)
TOTAL PROTEIN: 7.2 g/dL (ref 6.5–8.1)

## 2017-09-29 LAB — CBC WITH DIFFERENTIAL (CANCER CENTER ONLY)
BASOS ABS: 0 10*3/uL (ref 0.0–0.1)
Basophils Relative: 1 %
Eosinophils Absolute: 0.1 10*3/uL (ref 0.0–0.5)
Eosinophils Relative: 2 %
HEMATOCRIT: 26.1 % — AB (ref 34.8–46.6)
HEMOGLOBIN: 8.5 g/dL — AB (ref 11.6–15.9)
LYMPHS PCT: 27 %
Lymphs Abs: 0.9 10*3/uL (ref 0.9–3.3)
MCH: 35.6 pg — ABNORMAL HIGH (ref 25.1–34.0)
MCHC: 32.6 g/dL (ref 31.5–36.0)
MCV: 109.2 fL — AB (ref 79.5–101.0)
MONO ABS: 0.4 10*3/uL (ref 0.1–0.9)
MONOS PCT: 13 %
NEUTROS ABS: 1.9 10*3/uL (ref 1.5–6.5)
Neutrophils Relative %: 57 %
Platelet Count: 264 10*3/uL (ref 145–400)
RBC: 2.39 MIL/uL — ABNORMAL LOW (ref 3.70–5.45)
RDW: 20.5 % — AB (ref 11.2–14.5)
WBC Count: 3.3 10*3/uL — ABNORMAL LOW (ref 3.9–10.3)

## 2017-09-29 MED ORDER — POTASSIUM CHLORIDE CRYS ER 20 MEQ PO TBCR: 20.0000 meq | EXTENDED_RELEASE_TABLET | Freq: Every day | ORAL | 0 refills | Status: DC

## 2017-09-29 MED ORDER — DEXAMETHASONE SODIUM PHOSPHATE 10 MG/ML IJ SOLN
INTRAMUSCULAR | Status: AC
  Filled 2017-09-29: qty 1

## 2017-09-29 MED ORDER — SODIUM CHLORIDE 0.9% FLUSH
10.0000 mL | INTRAVENOUS | Status: DC | PRN
  Administered 2017-09-29 (×2): 10 mL
  Filled 2017-09-29: qty 10

## 2017-09-29 MED ORDER — SODIUM CHLORIDE 0.9 % IV SOLN
139.3500 mg | Freq: Once | INTRAVENOUS | Status: AC
  Administered 2017-09-29: 140 mg via INTRAVENOUS
  Filled 2017-09-29: qty 14

## 2017-09-29 MED ORDER — DEXAMETHASONE SODIUM PHOSPHATE 10 MG/ML IJ SOLN
10.0000 mg | Freq: Once | INTRAMUSCULAR | Status: AC
  Administered 2017-09-29: 10 mg via INTRAVENOUS

## 2017-09-29 MED ORDER — FAMOTIDINE IN NACL 20-0.9 MG/50ML-% IV SOLN
20.0000 mg | Freq: Once | INTRAVENOUS | Status: AC
  Administered 2017-09-29: 20 mg via INTRAVENOUS

## 2017-09-29 MED ORDER — SODIUM CHLORIDE 0.9% FLUSH
10.0000 mL | INTRAVENOUS | Status: DC | PRN
  Administered 2017-09-29: 10 mL via INTRAVENOUS
  Filled 2017-09-29: qty 10

## 2017-09-29 MED ORDER — FAMOTIDINE IN NACL 20-0.9 MG/50ML-% IV SOLN
INTRAVENOUS | Status: AC
  Filled 2017-09-29: qty 50

## 2017-09-29 MED ORDER — DIPHENHYDRAMINE HCL 50 MG/ML IJ SOLN
50.0000 mg | Freq: Once | INTRAMUSCULAR | Status: AC
  Administered 2017-09-29: 50 mg via INTRAVENOUS

## 2017-09-29 MED ORDER — ALTEPLASE 2 MG IJ SOLR
INTRAMUSCULAR | Status: AC
  Filled 2017-09-29: qty 2

## 2017-09-29 MED ORDER — DIPHENHYDRAMINE HCL 50 MG/ML IJ SOLN
INTRAMUSCULAR | Status: AC
  Filled 2017-09-29: qty 1

## 2017-09-29 MED ORDER — SODIUM CHLORIDE 0.9 % IV SOLN
80.0000 mg/m2 | Freq: Once | INTRAVENOUS | Status: AC
  Administered 2017-09-29: 126 mg via INTRAVENOUS
  Filled 2017-09-29: qty 21

## 2017-09-29 MED ORDER — SODIUM CHLORIDE 0.9 % IV SOLN
Freq: Once | INTRAVENOUS | Status: AC
  Administered 2017-09-29: 14:00:00 via INTRAVENOUS
  Filled 2017-09-29: qty 250

## 2017-09-29 MED ORDER — ALTEPLASE 2 MG IJ SOLR
2.0000 mg | Freq: Once | INTRAMUSCULAR | Status: AC
  Administered 2017-09-29: 2 mg
  Filled 2017-09-29: qty 2

## 2017-09-29 MED ORDER — HEPARIN SOD (PORK) LOCK FLUSH 100 UNIT/ML IV SOLN
500.0000 [IU] | Freq: Once | INTRAVENOUS | Status: AC | PRN
  Administered 2017-09-29: 500 [IU]
  Filled 2017-09-29: qty 5

## 2017-09-29 NOTE — Progress Notes (Signed)
Cathflo removed at 1350, 5 mls of bright red blood drawn.

## 2017-09-29 NOTE — Patient Instructions (Addendum)
[facility] Discharge Instructions for Patients Receiving Chemotherapy  Today you received the following chemotherapy agents: Carboplatin (Paraplatin) & Paclitaxel (Taxol).  To help prevent nausea and vomiting after your treatment, we encourage you to take your nausea medication as prescribed.   If you develop nausea and vomiting that is not controlled by your nausea medication, call the clinic.   BELOW ARE SYMPTOMS THAT SHOULD BE REPORTED IMMEDIATELY:  *FEVER GREATER THAN 100.5 F  *CHILLS WITH OR WITHOUT FEVER  NAUSEA AND VOMITING THAT IS NOT CONTROLLED WITH YOUR NAUSEA MEDICATION  *UNUSUAL SHORTNESS OF BREATH  *UNUSUAL BRUISING OR BLEEDING  TENDERNESS IN MOUTH AND THROAT WITH OR WITHOUT PRESENCE OF ULCERS  *URINARY PROBLEMS  *BOWEL PROBLEMS  UNUSUAL RASH Items with * indicate a potential emergency and should be followed up as soon as possible.  Feel free to call the clinic should you have any questions or concerns. The clinic phone number is [phone].  Please show the CHEMO ALERT CARD at check-in to the Emergency Department and triage nurse.   
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
## 2017-09-30 ENCOUNTER — Encounter: Payer: Self-pay | Admitting: Hematology

## 2017-10-06 ENCOUNTER — Inpatient Hospital Stay: Payer: Medicaid Other

## 2017-10-06 ENCOUNTER — Inpatient Hospital Stay (HOSPITAL_BASED_OUTPATIENT_CLINIC_OR_DEPARTMENT_OTHER): Payer: Medicaid Other | Admitting: Nurse Practitioner

## 2017-10-06 ENCOUNTER — Encounter: Payer: Self-pay | Admitting: Nurse Practitioner

## 2017-10-06 VITALS — BP 131/53 | HR 74 | Temp 98.4°F | Resp 17 | Ht 59.0 in | Wt 126.1 lb

## 2017-10-06 DIAGNOSIS — C773 Secondary and unspecified malignant neoplasm of axilla and upper limb lymph nodes: Secondary | ICD-10-CM | POA: Diagnosis not present

## 2017-10-06 DIAGNOSIS — C50212 Malignant neoplasm of upper-inner quadrant of left female breast: Secondary | ICD-10-CM

## 2017-10-06 DIAGNOSIS — Z170 Estrogen receptor positive status [ER+]: Secondary | ICD-10-CM

## 2017-10-06 DIAGNOSIS — R634 Abnormal weight loss: Secondary | ICD-10-CM

## 2017-10-06 DIAGNOSIS — G629 Polyneuropathy, unspecified: Secondary | ICD-10-CM

## 2017-10-06 DIAGNOSIS — I10 Essential (primary) hypertension: Secondary | ICD-10-CM

## 2017-10-06 DIAGNOSIS — Z95828 Presence of other vascular implants and grafts: Secondary | ICD-10-CM

## 2017-10-06 DIAGNOSIS — D6481 Anemia due to antineoplastic chemotherapy: Secondary | ICD-10-CM

## 2017-10-06 DIAGNOSIS — Z5111 Encounter for antineoplastic chemotherapy: Secondary | ICD-10-CM | POA: Diagnosis not present

## 2017-10-06 DIAGNOSIS — E119 Type 2 diabetes mellitus without complications: Secondary | ICD-10-CM

## 2017-10-06 LAB — CBC WITH DIFFERENTIAL (CANCER CENTER ONLY)
Basophils Absolute: 0 10*3/uL (ref 0.0–0.1)
Basophils Relative: 1 %
EOS ABS: 0.1 10*3/uL (ref 0.0–0.5)
EOS PCT: 3 %
HEMATOCRIT: 25.8 % — AB (ref 34.8–46.6)
Hemoglobin: 8.4 g/dL — ABNORMAL LOW (ref 11.6–15.9)
LYMPHS PCT: 32 %
Lymphs Abs: 1.1 10*3/uL (ref 0.9–3.3)
MCH: 36.5 pg — AB (ref 25.1–34.0)
MCHC: 32.6 g/dL (ref 31.5–36.0)
MCV: 112.2 fL — AB (ref 79.5–101.0)
MONOS PCT: 10 %
Monocytes Absolute: 0.4 10*3/uL (ref 0.1–0.9)
NEUTROS PCT: 54 %
NRBC: 3 /100{WBCs} — AB
Neutro Abs: 1.8 10*3/uL (ref 1.5–6.5)
PLATELETS: 210 10*3/uL (ref 145–400)
RBC: 2.3 MIL/uL — AB (ref 3.70–5.45)
RDW: 19.1 % — ABNORMAL HIGH (ref 11.2–14.5)
WBC: 3.4 10*3/uL — AB (ref 3.9–10.3)

## 2017-10-06 LAB — CMP (CANCER CENTER ONLY)
ALT: 20 U/L (ref 0–44)
AST: 37 U/L (ref 15–41)
Albumin: 3.7 g/dL (ref 3.5–5.0)
Alkaline Phosphatase: 74 U/L (ref 38–126)
Anion gap: 9 (ref 5–15)
BUN: 5 mg/dL — AB (ref 8–23)
CALCIUM: 9.5 mg/dL (ref 8.9–10.3)
CO2: 26 mmol/L (ref 22–32)
CREATININE: 0.68 mg/dL (ref 0.44–1.00)
Chloride: 105 mmol/L (ref 98–111)
Glucose, Bld: 248 mg/dL — ABNORMAL HIGH (ref 70–99)
Potassium: 4 mmol/L (ref 3.5–5.1)
Sodium: 140 mmol/L (ref 135–145)
Total Bilirubin: 0.4 mg/dL (ref 0.3–1.2)
Total Protein: 7.2 g/dL (ref 6.5–8.1)

## 2017-10-06 MED ORDER — DIPHENHYDRAMINE HCL 50 MG/ML IJ SOLN
50.0000 mg | Freq: Once | INTRAMUSCULAR | Status: AC
  Administered 2017-10-06: 50 mg via INTRAVENOUS

## 2017-10-06 MED ORDER — DEXAMETHASONE SODIUM PHOSPHATE 10 MG/ML IJ SOLN
INTRAMUSCULAR | Status: AC
  Filled 2017-10-06: qty 1

## 2017-10-06 MED ORDER — FAMOTIDINE IN NACL 20-0.9 MG/50ML-% IV SOLN
INTRAVENOUS | Status: AC
  Filled 2017-10-06: qty 50

## 2017-10-06 MED ORDER — DEXAMETHASONE SODIUM PHOSPHATE 10 MG/ML IJ SOLN
10.0000 mg | Freq: Once | INTRAMUSCULAR | Status: AC
  Administered 2017-10-06: 10 mg via INTRAVENOUS

## 2017-10-06 MED ORDER — SODIUM CHLORIDE 0.9 % IV SOLN
80.0000 mg/m2 | Freq: Once | INTRAVENOUS | Status: AC
  Administered 2017-10-06: 126 mg via INTRAVENOUS
  Filled 2017-10-06: qty 21

## 2017-10-06 MED ORDER — FAMOTIDINE IN NACL 20-0.9 MG/50ML-% IV SOLN
20.0000 mg | Freq: Once | INTRAVENOUS | Status: AC
  Administered 2017-10-06: 20 mg via INTRAVENOUS

## 2017-10-06 MED ORDER — HEPARIN SOD (PORK) LOCK FLUSH 100 UNIT/ML IV SOLN
500.0000 [IU] | Freq: Once | INTRAVENOUS | Status: AC | PRN
  Administered 2017-10-06: 500 [IU]
  Filled 2017-10-06: qty 5

## 2017-10-06 MED ORDER — SODIUM CHLORIDE 0.9 % IV SOLN
Freq: Once | INTRAVENOUS | Status: AC
  Administered 2017-10-06: 14:00:00 via INTRAVENOUS
  Filled 2017-10-06: qty 250

## 2017-10-06 MED ORDER — DIPHENHYDRAMINE HCL 50 MG/ML IJ SOLN
INTRAMUSCULAR | Status: AC
  Filled 2017-10-06: qty 1

## 2017-10-06 MED ORDER — SODIUM CHLORIDE 0.9% FLUSH
10.0000 mL | INTRAVENOUS | Status: DC | PRN
  Administered 2017-10-06: 10 mL
  Filled 2017-10-06: qty 10

## 2017-10-06 MED ORDER — SODIUM CHLORIDE 0.9 % IV SOLN
139.3500 mg | Freq: Once | INTRAVENOUS | Status: AC
  Administered 2017-10-06: 140 mg via INTRAVENOUS
  Filled 2017-10-06: qty 14

## 2017-10-06 NOTE — Patient Instructions (Signed)
[facility] Discharge Instructions for Patients Receiving Chemotherapy  Today you received the following chemotherapy agents: Carboplatin (Paraplatin) & Paclitaxel (Taxol).  To help prevent nausea and vomiting after your treatment, we encourage you to take your nausea medication as prescribed.   If you develop nausea and vomiting that is not controlled by your nausea medication, call the clinic.   BELOW ARE SYMPTOMS THAT SHOULD BE REPORTED IMMEDIATELY:  *FEVER GREATER THAN 100.5 F  *CHILLS WITH OR WITHOUT FEVER  NAUSEA AND VOMITING THAT IS NOT CONTROLLED WITH YOUR NAUSEA MEDICATION  *UNUSUAL SHORTNESS OF BREATH  *UNUSUAL BRUISING OR BLEEDING  TENDERNESS IN MOUTH AND THROAT WITH OR WITHOUT PRESENCE OF ULCERS  *URINARY PROBLEMS  *BOWEL PROBLEMS  UNUSUAL RASH Items with * indicate a potential emergency and should be followed up as soon as possible.  Feel free to call the clinic should you have any questions or concerns. The clinic phone number is [phone].  Please show the CHEMO ALERT CARD at check-in to the Emergency Department and triage nurse.   
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
## 2017-10-13 ENCOUNTER — Inpatient Hospital Stay: Payer: Medicaid Other

## 2017-10-13 ENCOUNTER — Telehealth: Payer: Self-pay | Admitting: Nurse Practitioner

## 2017-10-13 ENCOUNTER — Encounter: Payer: Self-pay | Admitting: Nurse Practitioner

## 2017-10-13 ENCOUNTER — Inpatient Hospital Stay (HOSPITAL_BASED_OUTPATIENT_CLINIC_OR_DEPARTMENT_OTHER): Payer: Medicaid Other | Admitting: Nurse Practitioner

## 2017-10-13 VITALS — BP 126/60 | HR 73 | Temp 98.2°F | Resp 18 | Ht 59.0 in | Wt 125.0 lb

## 2017-10-13 DIAGNOSIS — C50212 Malignant neoplasm of upper-inner quadrant of left female breast: Secondary | ICD-10-CM | POA: Diagnosis not present

## 2017-10-13 DIAGNOSIS — G629 Polyneuropathy, unspecified: Secondary | ICD-10-CM

## 2017-10-13 DIAGNOSIS — Z170 Estrogen receptor positive status [ER+]: Secondary | ICD-10-CM

## 2017-10-13 DIAGNOSIS — Z5111 Encounter for antineoplastic chemotherapy: Secondary | ICD-10-CM | POA: Diagnosis not present

## 2017-10-13 DIAGNOSIS — D6481 Anemia due to antineoplastic chemotherapy: Secondary | ICD-10-CM

## 2017-10-13 DIAGNOSIS — Z95828 Presence of other vascular implants and grafts: Secondary | ICD-10-CM

## 2017-10-13 DIAGNOSIS — T451X5A Adverse effect of antineoplastic and immunosuppressive drugs, initial encounter: Secondary | ICD-10-CM

## 2017-10-13 DIAGNOSIS — Z8781 Personal history of (healed) traumatic fracture: Secondary | ICD-10-CM

## 2017-10-13 DIAGNOSIS — C773 Secondary and unspecified malignant neoplasm of axilla and upper limb lymph nodes: Secondary | ICD-10-CM | POA: Diagnosis not present

## 2017-10-13 DIAGNOSIS — R634 Abnormal weight loss: Secondary | ICD-10-CM

## 2017-10-13 DIAGNOSIS — R5383 Other fatigue: Secondary | ICD-10-CM

## 2017-10-13 LAB — CBC WITH DIFFERENTIAL (CANCER CENTER ONLY)
BASOS ABS: 0 10*3/uL (ref 0.0–0.1)
BASOS PCT: 1 %
Eosinophils Absolute: 0.2 10*3/uL (ref 0.0–0.5)
Eosinophils Relative: 5 %
HCT: 26.6 % — ABNORMAL LOW (ref 34.8–46.6)
HEMOGLOBIN: 8.7 g/dL — AB (ref 11.6–15.9)
LYMPHS PCT: 34 %
Lymphs Abs: 1.2 10*3/uL (ref 0.9–3.3)
MCH: 36.6 pg — ABNORMAL HIGH (ref 25.1–34.0)
MCHC: 32.7 g/dL (ref 31.5–36.0)
MCV: 111.8 fL — ABNORMAL HIGH (ref 79.5–101.0)
MONO ABS: 0.3 10*3/uL (ref 0.1–0.9)
Monocytes Relative: 9 %
NEUTROS ABS: 1.7 10*3/uL (ref 1.5–6.5)
NEUTROS PCT: 51 %
NRBC: 1 /100{WBCs} — AB
Platelet Count: 212 10*3/uL (ref 145–400)
RBC: 2.38 MIL/uL — AB (ref 3.70–5.45)
RDW: 17.5 % — ABNORMAL HIGH (ref 11.2–14.5)
WBC: 3.4 10*3/uL — AB (ref 3.9–10.3)

## 2017-10-13 LAB — CMP (CANCER CENTER ONLY)
ALBUMIN: 3.8 g/dL (ref 3.5–5.0)
ALK PHOS: 78 U/L (ref 38–126)
ALT: 21 U/L (ref 0–44)
AST: 36 U/L (ref 15–41)
Anion gap: 10 (ref 5–15)
BUN: 5 mg/dL — ABNORMAL LOW (ref 8–23)
CO2: 24 mmol/L (ref 22–32)
Calcium: 9.1 mg/dL (ref 8.9–10.3)
Chloride: 105 mmol/L (ref 98–111)
Creatinine: 0.61 mg/dL (ref 0.44–1.00)
GFR, Est AFR Am: >60 mL/min (ref >60)
GFR, Estimated: >60 mL/min (ref >60)
GLUCOSE: 140 mg/dL — AB (ref 70–99)
Potassium: 3.8 mmol/L (ref 3.5–5.1)
SODIUM: 139 mmol/L (ref 135–145)
TOTAL PROTEIN: 7.1 g/dL (ref 6.5–8.1)
Total Bilirubin: 0.4 mg/dL (ref 0.3–1.2)

## 2017-10-13 MED ORDER — SODIUM CHLORIDE 0.9% FLUSH
10.0000 mL | INTRAVENOUS | Status: DC | PRN
  Administered 2017-10-13: 10 mL
  Filled 2017-10-13: qty 10

## 2017-10-13 MED ORDER — DEXAMETHASONE SODIUM PHOSPHATE 10 MG/ML IJ SOLN
10.0000 mg | Freq: Once | INTRAMUSCULAR | Status: AC
  Administered 2017-10-13: 10 mg via INTRAVENOUS

## 2017-10-13 MED ORDER — HEPARIN SOD (PORK) LOCK FLUSH 100 UNIT/ML IV SOLN
500.0000 [IU] | Freq: Once | INTRAVENOUS | Status: AC | PRN
  Administered 2017-10-13: 500 [IU]
  Filled 2017-10-13: qty 5

## 2017-10-13 MED ORDER — FAMOTIDINE IN NACL 20-0.9 MG/50ML-% IV SOLN
INTRAVENOUS | Status: AC
  Filled 2017-10-13: qty 50

## 2017-10-13 MED ORDER — SODIUM CHLORIDE 0.9 % IV SOLN
Freq: Once | INTRAVENOUS | Status: AC
  Administered 2017-10-13: 13:00:00 via INTRAVENOUS
  Filled 2017-10-13: qty 250

## 2017-10-13 MED ORDER — SODIUM CHLORIDE 0.9 % IV SOLN
139.3500 mg | Freq: Once | INTRAVENOUS | Status: AC
  Administered 2017-10-13: 140 mg via INTRAVENOUS
  Filled 2017-10-13: qty 14

## 2017-10-13 MED ORDER — DIPHENHYDRAMINE HCL 50 MG/ML IJ SOLN
INTRAMUSCULAR | Status: AC
  Filled 2017-10-13: qty 1

## 2017-10-13 MED ORDER — DIPHENHYDRAMINE HCL 50 MG/ML IJ SOLN
50.0000 mg | Freq: Once | INTRAMUSCULAR | Status: AC
  Administered 2017-10-13: 50 mg via INTRAVENOUS

## 2017-10-13 MED ORDER — SODIUM CHLORIDE 0.9 % IV SOLN
80.0000 mg/m2 | Freq: Once | INTRAVENOUS | Status: AC
  Administered 2017-10-13: 126 mg via INTRAVENOUS
  Filled 2017-10-13: qty 21

## 2017-10-13 MED ORDER — DEXAMETHASONE SODIUM PHOSPHATE 10 MG/ML IJ SOLN
INTRAMUSCULAR | Status: AC
  Filled 2017-10-13: qty 1

## 2017-10-13 MED ORDER — FAMOTIDINE IN NACL 20-0.9 MG/50ML-% IV SOLN
20.0000 mg | Freq: Once | INTRAVENOUS | Status: AC
  Administered 2017-10-13: 20 mg via INTRAVENOUS

## 2017-10-13 NOTE — Telephone Encounter (Signed)
No 9/20 los.  
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
## 2017-10-20 ENCOUNTER — Encounter: Payer: Self-pay | Admitting: Hematology

## 2017-10-20 ENCOUNTER — Inpatient Hospital Stay: Payer: Medicaid Other

## 2017-10-20 ENCOUNTER — Inpatient Hospital Stay (HOSPITAL_BASED_OUTPATIENT_CLINIC_OR_DEPARTMENT_OTHER): Payer: Medicaid Other | Admitting: Hematology

## 2017-10-20 ENCOUNTER — Telehealth: Payer: Self-pay

## 2017-10-20 VITALS — BP 131/58 | HR 69 | Temp 97.9°F | Resp 18 | Ht 59.0 in | Wt 127.6 lb

## 2017-10-20 DIAGNOSIS — C50212 Malignant neoplasm of upper-inner quadrant of left female breast: Secondary | ICD-10-CM

## 2017-10-20 DIAGNOSIS — Z8781 Personal history of (healed) traumatic fracture: Secondary | ICD-10-CM

## 2017-10-20 DIAGNOSIS — E119 Type 2 diabetes mellitus without complications: Secondary | ICD-10-CM

## 2017-10-20 DIAGNOSIS — D6481 Anemia due to antineoplastic chemotherapy: Secondary | ICD-10-CM

## 2017-10-20 DIAGNOSIS — I10 Essential (primary) hypertension: Secondary | ICD-10-CM

## 2017-10-20 DIAGNOSIS — C773 Secondary and unspecified malignant neoplasm of axilla and upper limb lymph nodes: Secondary | ICD-10-CM | POA: Diagnosis not present

## 2017-10-20 DIAGNOSIS — Z170 Estrogen receptor positive status [ER+]: Secondary | ICD-10-CM

## 2017-10-20 DIAGNOSIS — R634 Abnormal weight loss: Secondary | ICD-10-CM

## 2017-10-20 DIAGNOSIS — G629 Polyneuropathy, unspecified: Secondary | ICD-10-CM

## 2017-10-20 DIAGNOSIS — Z5111 Encounter for antineoplastic chemotherapy: Secondary | ICD-10-CM | POA: Diagnosis not present

## 2017-10-20 DIAGNOSIS — E118 Type 2 diabetes mellitus with unspecified complications: Secondary | ICD-10-CM

## 2017-10-20 DIAGNOSIS — Z95828 Presence of other vascular implants and grafts: Secondary | ICD-10-CM

## 2017-10-20 LAB — CMP (CANCER CENTER ONLY)
ALK PHOS: 72 U/L (ref 38–126)
ALT: 23 U/L (ref 0–44)
ANION GAP: 10 (ref 5–15)
AST: 37 U/L (ref 15–41)
Albumin: 3.7 g/dL (ref 3.5–5.0)
BUN: 6 mg/dL — ABNORMAL LOW (ref 8–23)
CALCIUM: 9.1 mg/dL (ref 8.9–10.3)
CO2: 24 mmol/L (ref 22–32)
Chloride: 103 mmol/L (ref 98–111)
Creatinine: 0.6 mg/dL (ref 0.44–1.00)
GFR, Estimated: >60 mL/min (ref >60)
Glucose, Bld: 196 mg/dL — ABNORMAL HIGH (ref 70–99)
Potassium: 4 mmol/L (ref 3.5–5.1)
SODIUM: 137 mmol/L (ref 135–145)
Total Bilirubin: 0.3 mg/dL (ref 0.3–1.2)
Total Protein: 6.9 g/dL (ref 6.5–8.1)

## 2017-10-20 LAB — CBC WITH DIFFERENTIAL (CANCER CENTER ONLY)
BASOS PCT: 0 %
Basophils Absolute: 0 10*3/uL (ref 0.0–0.1)
Eosinophils Absolute: 0.2 10*3/uL (ref 0.0–0.5)
Eosinophils Relative: 6 %
HEMATOCRIT: 25.5 % — AB (ref 34.8–46.6)
HEMOGLOBIN: 8.6 g/dL — AB (ref 11.6–15.9)
Lymphocytes Relative: 35 %
Lymphs Abs: 0.9 10*3/uL (ref 0.9–3.3)
MCH: 37.9 pg — AB (ref 25.1–34.0)
MCHC: 33.9 g/dL (ref 31.5–36.0)
MCV: 111.9 fL — ABNORMAL HIGH (ref 79.5–101.0)
MONOS PCT: 8 %
Monocytes Absolute: 0.2 10*3/uL (ref 0.1–0.9)
NEUTROS ABS: 1.4 10*3/uL — AB (ref 1.5–6.5)
NEUTROS PCT: 51 %
Platelet Count: 137 10*3/uL — ABNORMAL LOW (ref 145–400)
RBC: 2.28 MIL/uL — AB (ref 3.70–5.45)
RDW: 18.1 % — AB (ref 11.2–14.5)
WBC: 2.8 10*3/uL — AB (ref 3.9–10.3)

## 2017-10-20 MED ORDER — SODIUM CHLORIDE 0.9 % IV SOLN
60.0000 mg/m2 | Freq: Once | INTRAVENOUS | Status: AC
  Administered 2017-10-20: 96 mg via INTRAVENOUS
  Filled 2017-10-20: qty 16

## 2017-10-20 MED ORDER — HEPARIN SOD (PORK) LOCK FLUSH 100 UNIT/ML IV SOLN
500.0000 [IU] | Freq: Once | INTRAVENOUS | Status: AC | PRN
  Administered 2017-10-20: 500 [IU]
  Filled 2017-10-20: qty 5

## 2017-10-20 MED ORDER — DIPHENHYDRAMINE HCL 50 MG/ML IJ SOLN
INTRAMUSCULAR | Status: AC
  Filled 2017-10-20: qty 1

## 2017-10-20 MED ORDER — DIPHENHYDRAMINE HCL 50 MG/ML IJ SOLN
50.0000 mg | Freq: Once | INTRAMUSCULAR | Status: AC
  Administered 2017-10-20: 50 mg via INTRAVENOUS

## 2017-10-20 MED ORDER — SODIUM CHLORIDE 0.9 % IV SOLN
Freq: Once | INTRAVENOUS | Status: AC
  Administered 2017-10-20: 10:00:00 via INTRAVENOUS
  Filled 2017-10-20: qty 250

## 2017-10-20 MED ORDER — SODIUM CHLORIDE 0.9% FLUSH
10.0000 mL | INTRAVENOUS | Status: DC | PRN
  Administered 2017-10-20: 10 mL
  Filled 2017-10-20: qty 10

## 2017-10-20 MED ORDER — DEXAMETHASONE SODIUM PHOSPHATE 10 MG/ML IJ SOLN
INTRAMUSCULAR | Status: AC
  Filled 2017-10-20: qty 1

## 2017-10-20 MED ORDER — DEXAMETHASONE SODIUM PHOSPHATE 10 MG/ML IJ SOLN
10.0000 mg | Freq: Once | INTRAMUSCULAR | Status: AC
  Administered 2017-10-20: 10 mg via INTRAVENOUS

## 2017-10-20 MED ORDER — SODIUM CHLORIDE 0.9% FLUSH
10.0000 mL | INTRAVENOUS | Status: DC | PRN
  Administered 2017-10-20: 10 mL via INTRAVENOUS
  Filled 2017-10-20: qty 10

## 2017-10-20 MED ORDER — FAMOTIDINE IN NACL 20-0.9 MG/50ML-% IV SOLN
20.0000 mg | Freq: Once | INTRAVENOUS | Status: AC
  Administered 2017-10-20: 20 mg via INTRAVENOUS

## 2017-10-20 MED ORDER — FAMOTIDINE IN NACL 20-0.9 MG/50ML-% IV SOLN
INTRAVENOUS | Status: AC
  Filled 2017-10-20: qty 50

## 2017-10-20 MED ORDER — SODIUM CHLORIDE 0.9 % IV SOLN
139.3500 mg | Freq: Once | INTRAVENOUS | Status: AC
  Administered 2017-10-20: 140 mg via INTRAVENOUS
  Filled 2017-10-20: qty 14

## 2017-10-20 NOTE — Telephone Encounter (Signed)
Printed avs and calender of upcoming appointment. Per 9/27 los

## 2017-10-20 NOTE — Patient Instructions (Signed)
[facility] Discharge Instructions for Patients Receiving Chemotherapy  Today you received the following chemotherapy agents: Paclitaxel (Taxol) and Carboplatin (Paraplatin)  To help prevent nausea and vomiting after your treatment, we encourage you to take your nausea medication as directed.    If you develop nausea and vomiting that is not controlled by your nausea medication, call the clinic.   BELOW ARE SYMPTOMS THAT SHOULD BE REPORTED IMMEDIATELY:  *FEVER GREATER THAN 100.5 F  *CHILLS WITH OR WITHOUT FEVER  NAUSEA AND VOMITING THAT IS NOT CONTROLLED WITH YOUR NAUSEA MEDICATION  *UNUSUAL SHORTNESS OF BREATH  *UNUSUAL BRUISING OR BLEEDING  TENDERNESS IN MOUTH AND THROAT WITH OR WITHOUT PRESENCE OF ULCERS  *URINARY PROBLEMS  *BOWEL PROBLEMS  UNUSUAL RASH Items with * indicate a potential emergency and should be followed up as soon as possible.  Feel free to call the clinic should you have any questions or concerns. The clinic phone number is [phone].  Please show the CHEMO ALERT CARD at check-in to the Emergency Department and triage nurse.   

## 2017-10-24 ENCOUNTER — Ambulatory Visit (HOSPITAL_COMMUNITY)
Admission: RE | Admit: 2017-10-24 | Discharge: 2017-10-24 | Disposition: A | Discharge status: Home / Self Care | Payer: Medicaid Other | Source: Ambulatory Visit | Attending: Hematology | Admitting: Hematology

## 2017-10-24 DIAGNOSIS — C50212 Malignant neoplasm of upper-inner quadrant of left female breast: Secondary | ICD-10-CM | POA: Diagnosis not present

## 2017-10-24 DIAGNOSIS — Z170 Estrogen receptor positive status [ER+]: Secondary | ICD-10-CM | POA: Diagnosis not present

## 2017-10-24 MED ORDER — GADOBUTROL 1 MMOL/ML IV SOLN
7.5000 mL | Freq: Once | INTRAVENOUS | Status: AC | PRN
  Administered 2017-10-24: 6 mL via INTRAVENOUS

## 2017-10-26 ENCOUNTER — Other Ambulatory Visit: Payer: Self-pay | Admitting: General Surgery

## 2017-10-26 DIAGNOSIS — C773 Secondary and unspecified malignant neoplasm of axilla and upper limb lymph nodes: Secondary | ICD-10-CM | POA: Diagnosis not present

## 2017-10-26 DIAGNOSIS — C50912 Malignant neoplasm of unspecified site of left female breast: Secondary | ICD-10-CM | POA: Diagnosis not present

## 2017-10-26 DIAGNOSIS — C50812 Malignant neoplasm of overlapping sites of left female breast: Secondary | ICD-10-CM | POA: Diagnosis not present

## 2017-10-26 DIAGNOSIS — I10 Essential (primary) hypertension: Secondary | ICD-10-CM | POA: Diagnosis not present

## 2017-11-06 NOTE — Pre-Procedure Instructions (Signed)
Amanda Duarte  11/06/2017      [place], [place] [address] [place] [address] Phone: [phone] Fax: [phone]    Your procedure is scheduled on October 28th.  Report to [facility] Admitting at [phone] A.M.  Call this number if you have problems the morning of surgery:  [phone]   Remember:  Do not eat after midnight.  You may drink clear liquids until 0545 am .  Clear liquids allowed are:                    Water, Juice (non-citric and without pulp), Carbonated beverages, Clear Tea and Black Coffee only    Take these medicines the morning of surgery with A SIP OF WATER   Atorvastatin  Flexeril (if needed)  Hydocodone (if needed)  Ativan (if needed)   7 days prior to surgery STOP taking any Aspirin(unless otherwise instructed by your surgeon), Aleve, Naproxen, Ibuprofen, Motrin, Advil, Goody's, BC's, all herbal medications, fish oil, and all vitamins   WHAT DO I DO ABOUT MY DIABETES MEDICATION?   [name] Do not take oral diabetes medicines (pills) the morning of surgery: Glimepiride and Metormin.  . The day of surgery, do not take other diabetes injectables, including Byetta (exenatide), Bydureon (exenatide ER), Victoza (liraglutide), or Trulicity (dulaglutide).  . If your CBG is greater than 220 mg/dL, you may take  of your sliding scale (correction) dose of insulin.   How to Manage Your Diabetes Before and After Surgery  Why is it important to control my blood sugar before and after surgery? . Improving blood sugar levels before and after surgery helps healing and can limit problems. . A way of improving blood sugar control is eating a healthy diet by: o  Eating less sugar and carbohydrates o  Increasing activity/exercise o  Talking with your doctor about reaching your blood sugar goals . High blood sugars (greater than 180 mg/dL) can raise your risk of infections and slow your  recovery, so you will need to focus on controlling your diabetes during the weeks before surgery. . Make sure that the doctor who takes care of your diabetes knows about your planned surgery including the date and location.  How do I manage my blood sugar before surgery? . Check your blood sugar at least 4 times a day, starting 2 days before surgery, to make sure that the level is not too high or low. o Check your blood sugar the morning of your surgery when you wake up and every 2 hours until you get to the Short Stay unit. . If your blood sugar is less than 70 mg/dL, you will need to treat for low blood sugar: o Do not take insulin. o Treat a low blood sugar (less than 70 mg/dL) with  cup of clear juice (cranberry or apple), 4 glucose tablets, OR glucose gel. o Recheck blood sugar in 15 minutes after treatment (to make sure it is greater than 70 mg/dL). If your blood sugar is not greater than 70 mg/dL on recheck, call [phone] for further instructions. . Report your blood sugar to the short stay nurse when you get to Short Stay.  . If you are admitted to the hospital after surgery: o Your blood sugar will be checked by the staff and you will probably be given insulin after surgery (instead of oral diabetes medicines) to make sure you have good blood sugar levels.  o The goal for blood sugar control after surgery is 80-180 mg/dL.    Do not wear jewelry, make-up or nail polish.  Do not wear lotions, powders, or perfumes, or deodorant.  Do not shave 48 hours prior to surgery.  Men may shave face and neck.  Do not bring valuables to the hospital.  [facility] is not responsible for any belongings or valuables.  Contacts, dentures or bridgework may not be worn into surgery.  Leave your suitcase in the car.  After surgery it may be brought to your room.  For patients admitted to the hospital, discharge time will be determined by your treatment team.  Patients discharged the day of surgery  will not be allowed to drive home.    [facility]- Preparing For Surgery  Before surgery, you can play an important role. Because skin is not sterile, your skin needs to be as free of germs as possible. You can reduce the number of germs on your skin by washing with CHG (chlorahexidine gluconate) Soap before surgery.  CHG is an antiseptic cleaner which kills germs and bonds with the skin to continue killing germs even after washing.    Oral Hygiene is also important to reduce your risk of infection.  Remember - BRUSH YOUR TEETH THE MORNING OF SURGERY WITH YOUR REGULAR TOOTHPASTE  Please do not use if you have an allergy to CHG or antibacterial soaps. If your skin becomes reddened/irritated stop using the CHG.  Do not shave (including legs and underarms) for at least 48 hours prior to first CHG shower. It is OK to shave your face.  Please follow these instructions carefully.   1. Shower the NIGHT BEFORE SURGERY and the MORNING OF SURGERY with CHG.   2. If you chose to wash your hair, wash your hair first as usual with your normal shampoo.  3. After you shampoo, rinse your hair and body thoroughly to remove the shampoo.  4. Use CHG as you would any other liquid soap. You can apply CHG directly to the skin and wash gently with a scrungie or a clean washcloth.   5. Apply the CHG Soap to your body ONLY FROM THE NECK DOWN.  Do not use on open wounds or open sores. Avoid contact with your eyes, ears, mouth and genitals (private parts). Wash Face and genitals (private parts)  with your normal soap.  6. Wash thoroughly, paying special attention to the area where your surgery will be performed.  7. Thoroughly rinse your body with warm water from the neck down.  8. DO NOT shower/wash with your normal soap after using and rinsing off the CHG Soap.  9. Pat yourself dry with a CLEAN TOWEL.  10. Wear CLEAN PAJAMAS to bed the night before surgery, wear comfortable clothes the morning of  surgery  11. Place CLEAN SHEETS on your bed the night of your first shower and DO NOT SLEEP WITH PETS.    Day of Surgery:  Do not apply any deodorants/lotions.  Please wear clean clothes to the hospital/surgery center.   Remember to brush your teeth WITH YOUR REGULAR TOOTHPASTE.    Please read over the following fact sheets that you were given.

## 2017-11-07 ENCOUNTER — Other Ambulatory Visit: Payer: Self-pay

## 2017-11-07 ENCOUNTER — Encounter (HOSPITAL_COMMUNITY): Payer: Self-pay

## 2017-11-07 ENCOUNTER — Encounter (HOSPITAL_COMMUNITY)
Admission: RE | Admit: 2017-11-07 | Discharge: 2017-11-07 | Disposition: A | Discharge status: Home / Self Care | Payer: Medicaid Other | Source: Ambulatory Visit | Attending: General Surgery | Admitting: General Surgery

## 2017-11-07 DIAGNOSIS — Z01812 Encounter for preprocedural laboratory examination: Secondary | ICD-10-CM | POA: Diagnosis not present

## 2017-11-07 LAB — CBC WITH DIFFERENTIAL/PLATELET
Abs Immature Granulocytes: 0.01 10*3/uL (ref 0.00–0.07)
BASOS ABS: 0 10*3/uL (ref 0.0–0.1)
Basophils Relative: 0 %
EOS ABS: 0.7 10*3/uL — AB (ref 0.0–0.5)
EOS PCT: 19 %
HEMATOCRIT: 33.6 % — AB (ref 36.0–46.0)
Hemoglobin: 10.9 g/dL — ABNORMAL LOW (ref 12.0–15.0)
Immature Granulocytes: 0 %
LYMPHS ABS: 1.6 10*3/uL (ref 0.7–4.0)
Lymphocytes Relative: 40 %
MCH: 36.9 pg — AB (ref 26.0–34.0)
MCHC: 32.4 g/dL (ref 30.0–36.0)
MCV: 113.9 fL — AB (ref 80.0–100.0)
MONO ABS: 0.4 10*3/uL (ref 0.1–1.0)
Monocytes Relative: 11 %
NRBC: 0 % (ref 0.0–0.2)
Neutro Abs: 1.2 10*3/uL — ABNORMAL LOW (ref 1.7–7.7)
Neutrophils Relative %: 30 %
PLATELETS: 165 10*3/uL (ref 150–400)
RBC: 2.95 MIL/uL — ABNORMAL LOW (ref 3.87–5.11)
RDW: 16.3 % — ABNORMAL HIGH (ref 11.5–15.5)
WBC: 4 10*3/uL (ref 4.0–10.5)

## 2017-11-07 LAB — COMPREHENSIVE METABOLIC PANEL
ALT: 25 U/L (ref 0–44)
AST: 47 U/L — AB (ref 15–41)
Albumin: 3.7 g/dL (ref 3.5–5.0)
Alkaline Phosphatase: 79 U/L (ref 38–126)
Anion gap: 8 (ref 5–15)
BUN: 6 mg/dL — AB (ref 8–23)
CALCIUM: 9.3 mg/dL (ref 8.9–10.3)
CHLORIDE: 107 mmol/L (ref 98–111)
CO2: 23 mmol/L (ref 22–32)
CREATININE: 0.52 mg/dL (ref 0.44–1.00)
Glucose, Bld: 222 mg/dL — ABNORMAL HIGH (ref 70–99)
Potassium: 3.7 mmol/L (ref 3.5–5.1)
Sodium: 138 mmol/L (ref 135–145)
Total Bilirubin: 0.6 mg/dL (ref 0.3–1.2)
Total Protein: 7.4 g/dL (ref 6.5–8.1)

## 2017-11-07 LAB — GLUCOSE, CAPILLARY: GLUCOSE-CAPILLARY: 243 mg/dL — AB (ref 70–99)

## 2017-11-07 NOTE — Progress Notes (Addendum)
PCP: [name], NP  Cardiologist: pt denies  EKG: 04/12/17 in [facility]  Stress test: 05/19/14 in [facility]  ECHO: 06/01/17 in [facility]  Cardiac Cath: pt denies  Chest x-ray: 1-view 06/01/17 in [place] with patient at PAT appointment and request for day of surgery.
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
## 2017-11-10 ENCOUNTER — Telehealth: Payer: Self-pay

## 2017-11-10 ENCOUNTER — Inpatient Hospital Stay: Payer: Medicaid Other | Attending: Nurse Practitioner

## 2017-11-10 ENCOUNTER — Inpatient Hospital Stay (HOSPITAL_BASED_OUTPATIENT_CLINIC_OR_DEPARTMENT_OTHER): Payer: Medicaid Other | Admitting: Hematology

## 2017-11-10 ENCOUNTER — Encounter: Payer: Self-pay | Admitting: Hematology

## 2017-11-10 ENCOUNTER — Inpatient Hospital Stay: Payer: Medicaid Other

## 2017-11-10 VITALS — BP 144/69 | HR 74 | Temp 98.6°F | Resp 18 | Ht 59.0 in | Wt 125.9 lb

## 2017-11-10 DIAGNOSIS — C50212 Malignant neoplasm of upper-inner quadrant of left female breast: Secondary | ICD-10-CM | POA: Insufficient documentation

## 2017-11-10 DIAGNOSIS — C773 Secondary and unspecified malignant neoplasm of axilla and upper limb lymph nodes: Secondary | ICD-10-CM | POA: Diagnosis not present

## 2017-11-10 DIAGNOSIS — Z23 Encounter for immunization: Secondary | ICD-10-CM | POA: Insufficient documentation

## 2017-11-10 DIAGNOSIS — I10 Essential (primary) hypertension: Secondary | ICD-10-CM

## 2017-11-10 DIAGNOSIS — Z95828 Presence of other vascular implants and grafts: Secondary | ICD-10-CM

## 2017-11-10 DIAGNOSIS — Z9221 Personal history of antineoplastic chemotherapy: Secondary | ICD-10-CM

## 2017-11-10 DIAGNOSIS — Z170 Estrogen receptor positive status [ER+]: Secondary | ICD-10-CM | POA: Diagnosis not present

## 2017-11-10 DIAGNOSIS — Z5111 Encounter for antineoplastic chemotherapy: Secondary | ICD-10-CM | POA: Diagnosis present

## 2017-11-10 LAB — CMP (CANCER CENTER ONLY)
ALK PHOS: 85 U/L (ref 38–126)
ALT: 24 U/L (ref 0–44)
ANION GAP: 10 (ref 5–15)
AST: 62 U/L — ABNORMAL HIGH (ref 15–41)
Albumin: 3.7 g/dL (ref 3.5–5.0)
BUN: 6 mg/dL — ABNORMAL LOW (ref 8–23)
CALCIUM: 9.2 mg/dL (ref 8.9–10.3)
CO2: 25 mmol/L (ref 22–32)
Chloride: 103 mmol/L (ref 98–111)
Creatinine: 0.66 mg/dL (ref 0.44–1.00)
GFR, Est AFR Am: >60 mL/min (ref >60)
GFR, Estimated: >60 mL/min (ref >60)
Glucose, Bld: 236 mg/dL — ABNORMAL HIGH (ref 70–99)
Potassium: 3.7 mmol/L (ref 3.5–5.1)
SODIUM: 138 mmol/L (ref 135–145)
Total Bilirubin: 0.5 mg/dL (ref 0.3–1.2)
Total Protein: 7.6 g/dL (ref 6.5–8.1)

## 2017-11-10 LAB — CBC WITH DIFFERENTIAL (CANCER CENTER ONLY)
Abs Immature Granulocytes: 0.01 10*3/uL (ref 0.00–0.07)
BASOS ABS: 0 10*3/uL (ref 0.0–0.1)
BASOS PCT: 1 %
EOS ABS: 1.1 10*3/uL — AB (ref 0.0–0.5)
Eosinophils Relative: 26 %
HEMATOCRIT: 32.3 % — AB (ref 36.0–46.0)
Hemoglobin: 10.5 g/dL — ABNORMAL LOW (ref 12.0–15.0)
Immature Granulocytes: 0 %
LYMPHS ABS: 1.4 10*3/uL (ref 0.7–4.0)
Lymphocytes Relative: 32 %
MCH: 36.3 pg — ABNORMAL HIGH (ref 26.0–34.0)
MCHC: 32.5 g/dL (ref 30.0–36.0)
MCV: 111.8 fL — ABNORMAL HIGH (ref 80.0–100.0)
Monocytes Absolute: 0.4 10*3/uL (ref 0.1–1.0)
Monocytes Relative: 9 %
Neutro Abs: 1.4 10*3/uL — ABNORMAL LOW (ref 1.7–7.7)
Neutrophils Relative %: 32 %
PLATELETS: 171 10*3/uL (ref 150–400)
RBC: 2.89 MIL/uL — ABNORMAL LOW (ref 3.87–5.11)
RDW: 15.8 % — AB (ref 11.5–15.5)
WBC Count: 4.3 10*3/uL (ref 4.0–10.5)
nRBC: 0 % (ref 0.0–0.2)

## 2017-11-10 MED ORDER — SODIUM CHLORIDE 0.9% FLUSH
10.0000 mL | INTRAVENOUS | Status: DC | PRN
  Filled 2017-11-10: qty 10

## 2017-11-10 MED ORDER — INFLUENZA VAC SPLIT QUAD 0.5 ML IM SUSY
PREFILLED_SYRINGE | INTRAMUSCULAR | Status: AC
  Filled 2017-11-10: qty 0.5

## 2017-11-10 MED ORDER — HEPARIN SOD (PORK) LOCK FLUSH 100 UNIT/ML IV SOLN
500.0000 [IU] | Freq: Once | INTRAVENOUS | Status: DC | PRN
  Filled 2017-11-10: qty 5

## 2017-11-10 MED ORDER — INFLUENZA VAC SPLIT QUAD 0.5 ML IM SUSY
0.5000 mL | PREFILLED_SYRINGE | Freq: Once | INTRAMUSCULAR | Status: AC
  Administered 2017-11-10: 0.5 mL via INTRAMUSCULAR

## 2017-11-10 NOTE — Progress Notes (Signed)
Pt interpreted that she did not have cream and wanted blood from arm. Labs were drawn by Lab 2 peripherally.

## 2017-11-10 NOTE — Telephone Encounter (Signed)
Printed avs and calender of upcoming appointment. Per 10/18 los 
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
## 2017-11-19 NOTE — Anesthesia Preprocedure Evaluation (Addendum)
Anesthesia Evaluation  Patient identified by MRN, date of birth, ID band Patient awake    Reviewed: Allergy & Precautions, NPO status , Patient's Chart, lab work & pertinent test results  History of Anesthesia Complications Negative for: history of anesthetic complications  Airway Mallampati: II  TM Distance: >3 FB Neck ROM: Full    Dental  (+) Missing, Dental Advisory Given,    Pulmonary neg pulmonary ROS,    breath sounds clear to auscultation       Cardiovascular hypertension, Pt. on medications  Rhythm:Regular Rate:Normal     Neuro/Psych Anxiety negative neurological ROS     GI/Hepatic negative GI ROS, Neg liver ROS,   Endo/Other  diabetes, Type 2, Oral Hypoglycemic Agents  Renal/GU negative Renal ROS     Musculoskeletal negative musculoskeletal ROS (+)   Abdominal   Peds  Hematology negative hematology ROS (+)   Anesthesia Other Findings Day of surgery medications reviewed with the patient.  Reproductive/Obstetrics                            Anesthesia Physical Anesthesia Plan  ASA: II  Anesthesia Plan: General   Post-op Pain Management: GA combined w/ Regional for post-op pain   Induction: Intravenous  PONV Risk Score and Plan: 3 and Ondansetron, Dexamethasone, Treatment may vary due to age or medical condition and Midazolam  Airway Management Planned: Oral ETT  Additional Equipment:   Intra-op Plan:   Post-operative Plan: Extubation in OR  Informed Consent: I have reviewed the patients History and Physical, chart, labs and discussed the procedure including the risks, benefits and alternatives for the proposed anesthesia with the patient or authorized representative who has indicated his/her understanding and acceptance.   Dental advisory given  Plan Discussed with: CRNA and Surgeon  Anesthesia Plan Comments:        Anesthesia Quick Evaluation

## 2017-11-20 ENCOUNTER — Encounter (HOSPITAL_COMMUNITY)
Admission: RE | Disposition: A | Discharge status: Home / Self Care | Payer: Self-pay | Source: Ambulatory Visit | Attending: General Surgery

## 2017-11-20 ENCOUNTER — Ambulatory Visit (HOSPITAL_COMMUNITY): Payer: Medicaid Other | Admitting: Anesthesiology

## 2017-11-20 ENCOUNTER — Ambulatory Visit (HOSPITAL_COMMUNITY)
Admission: RE | Admit: 2017-11-20 | Discharge: 2017-11-21 | Disposition: A | Discharge status: Home / Self Care | Payer: Medicaid Other | Source: Ambulatory Visit | Attending: General Surgery | Admitting: General Surgery

## 2017-11-20 DIAGNOSIS — C50212 Malignant neoplasm of upper-inner quadrant of left female breast: Secondary | ICD-10-CM

## 2017-11-20 DIAGNOSIS — E119 Type 2 diabetes mellitus without complications: Secondary | ICD-10-CM | POA: Diagnosis not present

## 2017-11-20 DIAGNOSIS — Z170 Estrogen receptor positive status [ER+]: Secondary | ICD-10-CM

## 2017-11-20 DIAGNOSIS — R531 Weakness: Secondary | ICD-10-CM | POA: Insufficient documentation

## 2017-11-20 DIAGNOSIS — Z171 Estrogen receptor negative status [ER-]: Secondary | ICD-10-CM | POA: Diagnosis not present

## 2017-11-20 DIAGNOSIS — Z7984 Long term (current) use of oral hypoglycemic drugs: Secondary | ICD-10-CM | POA: Diagnosis not present

## 2017-11-20 DIAGNOSIS — Z9221 Personal history of antineoplastic chemotherapy: Secondary | ICD-10-CM | POA: Insufficient documentation

## 2017-11-20 DIAGNOSIS — G8918 Other acute postprocedural pain: Secondary | ICD-10-CM | POA: Diagnosis not present

## 2017-11-20 DIAGNOSIS — E785 Hyperlipidemia, unspecified: Secondary | ICD-10-CM | POA: Diagnosis not present

## 2017-11-20 DIAGNOSIS — C50812 Malignant neoplasm of overlapping sites of left female breast: Secondary | ICD-10-CM | POA: Insufficient documentation

## 2017-11-20 DIAGNOSIS — C773 Secondary and unspecified malignant neoplasm of axilla and upper limb lymph nodes: Secondary | ICD-10-CM | POA: Insufficient documentation

## 2017-11-20 DIAGNOSIS — Z452 Encounter for adjustment and management of vascular access device: Secondary | ICD-10-CM | POA: Insufficient documentation

## 2017-11-20 DIAGNOSIS — I10 Essential (primary) hypertension: Secondary | ICD-10-CM | POA: Insufficient documentation

## 2017-11-20 DIAGNOSIS — Z79899 Other long term (current) drug therapy: Secondary | ICD-10-CM | POA: Diagnosis not present

## 2017-11-20 HISTORY — PX: MASTECTOMY MODIFIED RADICAL: SHX5962

## 2017-11-20 HISTORY — PX: PORT-A-CATH REMOVAL: SHX5289

## 2017-11-20 LAB — GLUCOSE, CAPILLARY
GLUCOSE-CAPILLARY: 175 mg/dL — AB (ref 70–99)
Glucose-Capillary: 162 mg/dL — ABNORMAL HIGH (ref 70–99)
Glucose-Capillary: 153 mg/dL — ABNORMAL HIGH (ref 70–99)
Glucose-Capillary: 151 mg/dL — ABNORMAL HIGH (ref 70–99)

## 2017-11-20 SURGERY — MASTECTOMY, MODIFIED RADICAL
Anesthesia: General | Site: Chest | Laterality: Right

## 2017-11-20 MED ORDER — FENTANYL CITRATE (PF) 250 MCG/5ML IJ SOLN
INTRAMUSCULAR | Status: AC
  Filled 2017-11-20: qty 5

## 2017-11-20 MED ORDER — GLIMEPIRIDE 4 MG PO TABS
4.0000 mg | ORAL_TABLET | Freq: Every day | ORAL | Status: DC
  Administered 2017-11-21: 4 mg via ORAL
  Filled 2017-11-20: qty 1

## 2017-11-20 MED ORDER — LIDOCAINE-EPINEPHRINE (PF) 1 %-1:200000 IJ SOLN
INTRAMUSCULAR | Status: AC
  Filled 2017-11-20: qty 30

## 2017-11-20 MED ORDER — FENTANYL CITRATE (PF) 100 MCG/2ML IJ SOLN
INTRAMUSCULAR | Status: AC
  Filled 2017-11-20: qty 2

## 2017-11-20 MED ORDER — METHYLENE BLUE 0.5 % INJ SOLN
INTRAVENOUS | Status: AC
  Filled 2017-11-20: qty 10

## 2017-11-20 MED ORDER — DEXAMETHASONE SODIUM PHOSPHATE 10 MG/ML IJ SOLN
INTRAMUSCULAR | Status: AC
  Filled 2017-11-20: qty 1

## 2017-11-20 MED ORDER — ROCURONIUM BROMIDE 50 MG/5ML IV SOSY
PREFILLED_SYRINGE | INTRAVENOUS | Status: AC
  Filled 2017-11-20: qty 5

## 2017-11-20 MED ORDER — SENNA 8.6 MG PO TABS
1.0000 | ORAL_TABLET | Freq: Two times a day (BID) | ORAL | Status: DC
  Administered 2017-11-20 – 2017-11-21 (×3): 8.6 mg via ORAL
  Filled 2017-11-20 (×3): qty 1

## 2017-11-20 MED ORDER — HYDROCODONE-ACETAMINOPHEN 5-325 MG PO TABS: 1.0000 | ORAL_TABLET | Freq: Four times a day (QID) | ORAL | Status: DC | PRN

## 2017-11-20 MED ORDER — GABAPENTIN 300 MG PO CAPS
300.0000 mg | ORAL_CAPSULE | Freq: Two times a day (BID) | ORAL | Status: DC
  Administered 2017-11-20 – 2017-11-21 (×2): 300 mg via ORAL
  Filled 2017-11-20 (×3): qty 1

## 2017-11-20 MED ORDER — OXYCODONE HCL 5 MG PO TABS: 5.0000 mg | ORAL_TABLET | Freq: Once | ORAL | Status: DC | PRN

## 2017-11-20 MED ORDER — METHOCARBAMOL 500 MG PO TABS
500.0000 mg | ORAL_TABLET | Freq: Four times a day (QID) | ORAL | Status: DC | PRN
  Administered 2017-11-20 – 2017-11-21 (×2): 500 mg via ORAL
  Filled 2017-11-20 (×2): qty 1

## 2017-11-20 MED ORDER — LIDOCAINE 2% (20 MG/ML) 5 ML SYRINGE
INTRAMUSCULAR | Status: AC
  Filled 2017-11-20: qty 5

## 2017-11-20 MED ORDER — MIDAZOLAM HCL 2 MG/2ML IJ SOLN
INTRAMUSCULAR | Status: DC | PRN
  Administered 2017-11-20: 1 mg via INTRAVENOUS

## 2017-11-20 MED ORDER — SODIUM CHLORIDE 0.9 % IV SOLN
INTRAVENOUS | Status: DC | PRN
  Administered 2017-11-20: 30 ug/min via INTRAVENOUS

## 2017-11-20 MED ORDER — FENTANYL CITRATE (PF) 100 MCG/2ML IJ SOLN
INTRAMUSCULAR | Status: DC | PRN
  Administered 2017-11-20 (×5): 50 ug via INTRAVENOUS

## 2017-11-20 MED ORDER — MEPERIDINE HCL 50 MG/ML IJ SOLN: 6.2500 mg | INTRAMUSCULAR | Status: DC | PRN

## 2017-11-20 MED ORDER — CHLORHEXIDINE GLUCONATE CLOTH 2 % EX PADS: 6.0000 | MEDICATED_PAD | Freq: Once | CUTANEOUS | Status: DC

## 2017-11-20 MED ORDER — ONDANSETRON HCL 4 MG/2ML IJ SOLN
INTRAMUSCULAR | Status: DC | PRN
  Administered 2017-11-20: 4 mg via INTRAVENOUS

## 2017-11-20 MED ORDER — LORAZEPAM 0.5 MG PO TABS: 0.5000 mg | ORAL_TABLET | Freq: Four times a day (QID) | ORAL | Status: DC | PRN

## 2017-11-20 MED ORDER — LOSARTAN POTASSIUM 50 MG PO TABS
50.0000 mg | ORAL_TABLET | Freq: Every day | ORAL | Status: DC
  Administered 2017-11-21: 50 mg via ORAL
  Filled 2017-11-20: qty 1

## 2017-11-20 MED ORDER — 0.9 % SODIUM CHLORIDE (POUR BTL) OPTIME
TOPICAL | Status: DC | PRN
  Administered 2017-11-20 (×2): 1000 mL

## 2017-11-20 MED ORDER — METFORMIN HCL 500 MG PO TABS
1000.0000 mg | ORAL_TABLET | Freq: Two times a day (BID) | ORAL | Status: DC
  Administered 2017-11-21: 1000 mg via ORAL
  Filled 2017-11-20: qty 2

## 2017-11-20 MED ORDER — ONDANSETRON 4 MG PO TBDP: 4.0000 mg | ORAL_TABLET | Freq: Four times a day (QID) | ORAL | Status: DC | PRN

## 2017-11-20 MED ORDER — PROMETHAZINE HCL 25 MG/ML IJ SOLN: 6.2500 mg | INTRAMUSCULAR | Status: DC | PRN

## 2017-11-20 MED ORDER — TRAMADOL HCL 50 MG PO TABS: 50.0000 mg | ORAL_TABLET | Freq: Four times a day (QID) | ORAL | Status: DC | PRN

## 2017-11-20 MED ORDER — ONDANSETRON HCL 4 MG/2ML IJ SOLN: 4.0000 mg | Freq: Four times a day (QID) | INTRAMUSCULAR | Status: DC | PRN

## 2017-11-20 MED ORDER — SENNA 8.6 MG PO TABS: 1.0000 | ORAL_TABLET | Freq: Every day | ORAL | Status: DC | PRN

## 2017-11-20 MED ORDER — CELECOXIB 200 MG PO CAPS
200.0000 mg | ORAL_CAPSULE | ORAL | Status: AC
  Administered 2017-11-20: 200 mg via ORAL
  Filled 2017-11-20: qty 1

## 2017-11-20 MED ORDER — SUCCINYLCHOLINE CHLORIDE 200 MG/10ML IV SOSY
PREFILLED_SYRINGE | INTRAVENOUS | Status: DC | PRN
  Administered 2017-11-20: 80 mg via INTRAVENOUS

## 2017-11-20 MED ORDER — POTASSIUM CHLORIDE CRYS ER 20 MEQ PO TBCR
20.0000 meq | EXTENDED_RELEASE_TABLET | Freq: Every day | ORAL | Status: DC
  Administered 2017-11-21: 20 meq via ORAL
  Filled 2017-11-20: qty 1

## 2017-11-20 MED ORDER — ENOXAPARIN SODIUM 40 MG/0.4ML ~~LOC~~ SOLN: 40.0000 mg | SUBCUTANEOUS | Status: DC

## 2017-11-20 MED ORDER — SITAGLIPTIN PHOS-METFORMIN HCL 50-1000 MG PO TABS: 1.0000 | ORAL_TABLET | Freq: Two times a day (BID) | ORAL | Status: DC

## 2017-11-20 MED ORDER — ONDANSETRON HCL 4 MG/2ML IJ SOLN
INTRAMUSCULAR | Status: AC
  Filled 2017-11-20: qty 2

## 2017-11-20 MED ORDER — MIDAZOLAM HCL 2 MG/2ML IJ SOLN
INTRAMUSCULAR | Status: AC
  Administered 2017-11-20: 1 mg
  Filled 2017-11-20: qty 2

## 2017-11-20 MED ORDER — CELECOXIB 200 MG PO CAPS
200.0000 mg | ORAL_CAPSULE | Freq: Two times a day (BID) | ORAL | Status: DC
  Administered 2017-11-20 – 2017-11-21 (×2): 200 mg via ORAL
  Filled 2017-11-20 (×3): qty 1

## 2017-11-20 MED ORDER — CEFAZOLIN SODIUM-DEXTROSE 2-4 GM/100ML-% IV SOLN
2.0000 g | INTRAVENOUS | Status: AC
  Administered 2017-11-20: 2 g via INTRAVENOUS
  Filled 2017-11-20: qty 100

## 2017-11-20 MED ORDER — LACTATED RINGERS IV SOLN
INTRAVENOUS | Status: DC
  Administered 2017-11-20 (×2): via INTRAVENOUS

## 2017-11-20 MED ORDER — INSULIN ASPART 100 UNIT/ML ~~LOC~~ SOLN
0.0000 [IU] | Freq: Three times a day (TID) | SUBCUTANEOUS | Status: DC
  Administered 2017-11-20: 3 [IU] via SUBCUTANEOUS
  Administered 2017-11-21: 2 [IU] via SUBCUTANEOUS
  Administered 2017-11-21: 5 [IU] via SUBCUTANEOUS

## 2017-11-20 MED ORDER — HYDROCODONE-ACETAMINOPHEN 5-325 MG PO TABS: 1.0000 | ORAL_TABLET | ORAL | Status: DC | PRN

## 2017-11-20 MED ORDER — CYCLOBENZAPRINE HCL 5 MG PO TABS: 5.0000 mg | ORAL_TABLET | Freq: Three times a day (TID) | ORAL | Status: DC | PRN

## 2017-11-20 MED ORDER — SUCCINYLCHOLINE CHLORIDE 200 MG/10ML IV SOSY
PREFILLED_SYRINGE | INTRAVENOUS | Status: AC
  Filled 2017-11-20: qty 10

## 2017-11-20 MED ORDER — PROCHLORPERAZINE MALEATE 10 MG PO TABS
10.0000 mg | ORAL_TABLET | Freq: Four times a day (QID) | ORAL | Status: DC | PRN
  Filled 2017-11-20: qty 1

## 2017-11-20 MED ORDER — PHENYLEPHRINE 40 MCG/ML (10ML) SYRINGE FOR IV PUSH (FOR BLOOD PRESSURE SUPPORT)
PREFILLED_SYRINGE | INTRAVENOUS | Status: AC
  Filled 2017-11-20: qty 10

## 2017-11-20 MED ORDER — PHENYLEPHRINE 40 MCG/ML (10ML) SYRINGE FOR IV PUSH (FOR BLOOD PRESSURE SUPPORT)
PREFILLED_SYRINGE | INTRAVENOUS | Status: DC | PRN
  Administered 2017-11-20 (×5): 80 ug via INTRAVENOUS

## 2017-11-20 MED ORDER — FENTANYL CITRATE (PF) 100 MCG/2ML IJ SOLN
INTRAMUSCULAR | Status: AC
  Administered 2017-11-20: 50 ug
  Filled 2017-11-20: qty 2

## 2017-11-20 MED ORDER — ACETAMINOPHEN 500 MG PO TABS
1000.0000 mg | ORAL_TABLET | ORAL | Status: AC
  Administered 2017-11-20: 1000 mg via ORAL
  Filled 2017-11-20: qty 2

## 2017-11-20 MED ORDER — PROPOFOL 10 MG/ML IV BOLUS
INTRAVENOUS | Status: DC | PRN
  Administered 2017-11-20: 40 mg via INTRAVENOUS
  Administered 2017-11-20: 50 mg via INTRAVENOUS
  Administered 2017-11-20: 110 mg via INTRAVENOUS

## 2017-11-20 MED ORDER — LACTATED RINGERS IV SOLN
INTRAVENOUS | Status: DC
  Administered 2017-11-20 – 2017-11-21 (×2): via INTRAVENOUS

## 2017-11-20 MED ORDER — BUPIVACAINE-EPINEPHRINE (PF) 0.25% -1:200000 IJ SOLN
INTRAMUSCULAR | Status: DC | PRN
  Administered 2017-11-20: 15 mL
  Administered 2017-11-20: 10 mL

## 2017-11-20 MED ORDER — OMEGA-3-ACID ETHYL ESTERS 1 G PO CAPS
1.0000 g | ORAL_CAPSULE | Freq: Two times a day (BID) | ORAL | Status: DC
  Administered 2017-11-20 – 2017-11-21 (×2): 1 g via ORAL
  Filled 2017-11-20 (×2): qty 1

## 2017-11-20 MED ORDER — HYDROMORPHONE HCL 1 MG/ML IJ SOLN: 1.0000 mg | INTRAMUSCULAR | Status: DC | PRN

## 2017-11-20 MED ORDER — LIDOCAINE 2% (20 MG/ML) 5 ML SYRINGE
INTRAMUSCULAR | Status: DC | PRN
  Administered 2017-11-20: 100 mg via INTRAVENOUS

## 2017-11-20 MED ORDER — MIDAZOLAM HCL 2 MG/2ML IJ SOLN
INTRAMUSCULAR | Status: AC
  Filled 2017-11-20: qty 2

## 2017-11-20 MED ORDER — OXYCODONE HCL 5 MG/5ML PO SOLN: 5.0000 mg | Freq: Once | ORAL | Status: DC | PRN

## 2017-11-20 MED ORDER — HYDROCHLOROTHIAZIDE 25 MG PO TABS
25.0000 mg | ORAL_TABLET | Freq: Every day | ORAL | Status: DC
  Administered 2017-11-21: 25 mg via ORAL
  Filled 2017-11-20: qty 1

## 2017-11-20 MED ORDER — PROPOFOL 10 MG/ML IV BOLUS
INTRAVENOUS | Status: AC
  Filled 2017-11-20: qty 20

## 2017-11-20 MED ORDER — ATORVASTATIN CALCIUM 20 MG PO TABS
20.0000 mg | ORAL_TABLET | Freq: Every day | ORAL | Status: DC
  Administered 2017-11-21: 20 mg via ORAL
  Filled 2017-11-20: qty 1

## 2017-11-20 MED ORDER — ACETAMINOPHEN 10 MG/ML IV SOLN: 1000.0000 mg | Freq: Once | INTRAVENOUS | Status: DC | PRN

## 2017-11-20 MED ORDER — FENTANYL CITRATE (PF) 100 MCG/2ML IJ SOLN
25.0000 ug | INTRAMUSCULAR | Status: DC | PRN
  Administered 2017-11-20 (×4): 25 ug via INTRAVENOUS

## 2017-11-20 MED ORDER — LINAGLIPTIN 5 MG PO TABS
5.0000 mg | ORAL_TABLET | Freq: Every day | ORAL | Status: DC
  Administered 2017-11-21: 5 mg via ORAL
  Filled 2017-11-20: qty 1

## 2017-11-20 MED ORDER — GABAPENTIN 300 MG PO CAPS
300.0000 mg | ORAL_CAPSULE | ORAL | Status: AC
  Administered 2017-11-20: 300 mg via ORAL
  Filled 2017-11-20: qty 1

## 2017-11-20 SURGICAL SUPPLY — 59 items
ADH SKN CLS APL DERMABOND .7 (GAUZE/BANDAGES/DRESSINGS) ×4
APPLIER CLIP 9.375 MED OPEN (MISCELLANEOUS) ×6
APR CLP MED 9.3 20 MLT OPN (MISCELLANEOUS) ×4
BINDER BREAST LRG (GAUZE/BANDAGES/DRESSINGS) ×3 IMPLANT
BIOPATCH RED 1 DISK 7.0 (GAUZE/BANDAGES/DRESSINGS) ×6 IMPLANT
BLADE SURG 15 STRL LF DISP TIS (BLADE) ×2 IMPLANT
BLADE SURG 15 STRL SS (BLADE) ×2
CANISTER SUCT 3000ML PPV (MISCELLANEOUS) ×3 IMPLANT
CHLORAPREP W/TINT 26ML (MISCELLANEOUS) ×3 IMPLANT
CLIP APPLIE 9.375 MED OPEN (MISCELLANEOUS) ×4 IMPLANT
CONT SPEC 4OZ CLIKSEAL STRL BL (MISCELLANEOUS) IMPLANT
COVER MAYO STAND STRL (DRAPES) ×3 IMPLANT
COVER SURGICAL LIGHT HANDLE (MISCELLANEOUS) ×3 IMPLANT
COVER WAND RF STERILE (DRAPES) ×3 IMPLANT
DERMABOND ADVANCED (GAUZE/BANDAGES/DRESSINGS) ×2
DERMABOND ADVANCED .7 DNX12 (GAUZE/BANDAGES/DRESSINGS) ×4 IMPLANT
DEVICE DISSECT PLASMABLAD 3.0S (MISCELLANEOUS) IMPLANT
DRAIN CHANNEL 19F RND (DRAIN) ×6 IMPLANT
DRAPE LAPAROSCOPIC ABDOMINAL (DRAPES) ×3 IMPLANT
DRAPE UTILITY XL STRL (DRAPES) ×3 IMPLANT
DRSG PAD ABDOMINAL 8X10 ST (GAUZE/BANDAGES/DRESSINGS) ×6 IMPLANT
DRSG TEGADERM 4X4.75 (GAUZE/BANDAGES/DRESSINGS) ×3 IMPLANT
ELECT BLADE 4.0 EZ CLEAN MEGAD (MISCELLANEOUS) ×3
ELECT CAUTERY BLADE 6.4 (BLADE) ×3 IMPLANT
ELECT REM PT RETURN 9FT ADLT (ELECTROSURGICAL) ×3
ELECTRODE BLDE 4.0 EZ CLN MEGD (MISCELLANEOUS) ×2 IMPLANT
ELECTRODE REM PT RTRN 9FT ADLT (ELECTROSURGICAL) ×2 IMPLANT
EVACUATOR SILICONE 100CC (DRAIN) ×6 IMPLANT
GAUZE 4X4 16PLY RFD (DISPOSABLE) IMPLANT
GAUZE SPONGE 4X4 12PLY STRL (GAUZE/BANDAGES/DRESSINGS) ×6 IMPLANT
GLOVE BIO SURGEON STRL SZ 6.5 (GLOVE) ×3 IMPLANT
GLOVE BIOGEL PI IND STRL 6.5 (GLOVE) ×2 IMPLANT
GLOVE BIOGEL PI IND STRL 7.0 (GLOVE) ×2 IMPLANT
GLOVE BIOGEL PI INDICATOR 6.5 (GLOVE) ×1
GLOVE BIOGEL PI INDICATOR 7.0 (GLOVE) ×1
GLOVE EUDERMIC 7 POWDERFREE (GLOVE) ×3 IMPLANT
GLOVE SURG SS PI 7.0 STRL IVOR (GLOVE) ×6 IMPLANT
GOWN STRL REUS W/ TWL LRG LVL3 (GOWN DISPOSABLE) ×4 IMPLANT
GOWN STRL REUS W/ TWL XL LVL3 (GOWN DISPOSABLE) ×2 IMPLANT
GOWN STRL REUS W/TWL LRG LVL3 (GOWN DISPOSABLE) ×4
GOWN STRL REUS W/TWL XL LVL3 (GOWN DISPOSABLE) ×3
ILLUMINATOR WAVEGUIDE N/F (MISCELLANEOUS) IMPLANT
KIT BASIN OR (CUSTOM PROCEDURE TRAY) ×3 IMPLANT
KIT TURNOVER KIT B (KITS) ×3 IMPLANT
LIGHT WAVEGUIDE WIDE FLAT (MISCELLANEOUS) IMPLANT
NEEDLE HYPO 25GX1X1/2 BEV (NEEDLE) ×3 IMPLANT
NS IRRIG 1000ML POUR BTL (IV SOLUTION) ×3 IMPLANT
PACK GENERAL/GYN (CUSTOM PROCEDURE TRAY) ×3 IMPLANT
PAD ARMBOARD 7.5X6 YLW CONV (MISCELLANEOUS) ×3 IMPLANT
PLASMABLADE 3.0S (MISCELLANEOUS)
SPECIMEN JAR X LARGE (MISCELLANEOUS) ×3 IMPLANT
SPONGE LAP 18X18 RF (DISPOSABLE) ×3 IMPLANT
STAPLER VISISTAT 35W (STAPLE) ×3 IMPLANT
SUT ETHILON 3 0 FSL (SUTURE) ×6 IMPLANT
SUT MNCRL AB 4-0 PS2 18 (SUTURE) ×9 IMPLANT
SUT SILK 2 0 PERMA HAND 18 BK (SUTURE) ×3 IMPLANT
SUT VIC AB 3-0 54X BRD REEL (SUTURE) ×4 IMPLANT
SUT VIC AB 3-0 BRD 54 (SUTURE) ×4
SUT VIC AB 3-0 SH 18 (SUTURE) ×6 IMPLANT

## 2017-11-20 NOTE — Interval H&P Note (Signed)
History and Physical Interval Note:  11/20/2017 7:43 AM  Amanda Duarte  has presented today for surgery, with the diagnosis of LEFT BREAST CANCER  The various methods of treatment have been discussed with the patient and family. After consideration of risks, benefits and other options for treatment, the patient has consented to  Procedure(s): LEFT MODIFIED RADICAL MASTECTOMY (Left) REMOVAL PORT-A-CATH (N/A) as a surgical intervention .  The patient's history has been reviewed, patient examined, no change in status, stable for surgery.  I have reviewed the patient's chart and labs.  Questions were answered to the patient's satisfaction.     [name]

## 2017-11-20 NOTE — Transfer of Care (Signed)
Immediate Anesthesia Transfer of Care Note  Patient: Amanda Duarte  Procedure(s) Performed: LEFT MODIFIED RADICAL MASTECTOMY (Left Breast) REMOVAL PORT-A-CATH (Right Chest)  Patient Location: PACU  Anesthesia Type:GA combined with regional for post-op pain  Level of Consciousness: awake, alert  and drowsy  Airway & Oxygen Therapy: Patient Spontanous Breathing and Patient connected to nasal cannula oxygen  Post-op Assessment: Report given to RN and Post -op Vital signs reviewed and stable  Post vital signs: Reviewed and stable  Last Vitals:  Vitals Value Taken Time  BP 164/69 11/20/2017 11:29 AM  Temp 36.2 C 11/20/2017 11:30 AM  Pulse 73 11/20/2017 11:29 AM  Resp 17 11/20/2017 11:29 AM  SpO2 98 % 11/20/2017 11:29 AM  Vitals shown include unvalidated device data.  Last Pain:  Vitals:   11/20/17 0700  TempSrc: Oral         Complications: No apparent anesthesia complications

## 2017-11-20 NOTE — Anesthesia Procedure Notes (Addendum)
Anesthesia Regional Block: Pectoralis block   Pre-Anesthetic Checklist: ,, timeout performed, Correct Patient, Correct Site, Correct Laterality, Correct Procedure, Correct Position, site marked, Risks and benefits discussed, pre-op evaluation,  At surgeon's request and post-op pain management  Laterality: Left  Prep: Maximum Sterile Barrier Precautions used, chloraprep       Needles:  Injection technique: Single-shot  Needle Type: Echogenic Stimulator Needle     Needle Length: 9cm  Needle Gauge: 22     Additional Needles:   Procedures:,,,, ultrasound used (permanent image in chart),,,,  Narrative:  Start time: 11/20/2017 8:06 AM End time: 11/20/2017 8:08 AM Injection made incrementally with aspirations every 5 mL.  Performed by: Personally  Anesthesiologist: Brennan Bailey, MD  Additional Notes: Risks, benefits, and alternative discussed. Patient gave consent for procedure. Patient prepped and draped in sterile fashion. Sedation administered, patient remains easily responsive to voice. Relevant anatomy identified with ultrasound guidance. Local anesthetic given in 5cc increments with no signs or symptoms of intravascular injection. No pain or paraesthesias with injection. Patient monitored throughout procedure with signs of LAST or immediate complications. Tolerated well. Ultrasound image placed in chart.  Tawny Asal, MD

## 2017-11-20 NOTE — Care Management Note (Signed)
Case Management Note  Patient Details  Name: Kadijah Shamoon MRN: [id number] Date of Birth: [birth year]  Subjective/Objective:                    Action/Plan:  Patient active with [facility] and [facility] has follow up appointment on December 26, 2017 at 0930 am. Can have prescriptions filled at [facility] at discharge.Will continue to follow. Expected Discharge Date:                  Expected Discharge Plan:  Home/Self Care  In-House Referral:  Financial Counselor  Discharge planning Services     Post Acute Care Choice:  NA Choice offered to:     DME Arranged:  N/A DME Agency:  NA  HH Arranged:  NA HH Agency:  NA  Status of Service:  In process, will continue to follow  If discussed at Long Length of Stay Meetings, dates discussed:    Additional Comments:  Marilu Favre, RN 11/20/2017, 4:34 PM

## 2017-11-20 NOTE — Anesthesia Postprocedure Evaluation (Signed)
Anesthesia Post Note  Patient: Jaylina Ramdass  Procedure(s) Performed: LEFT MODIFIED RADICAL MASTECTOMY (Left Breast) REMOVAL PORT-A-CATH (Right Chest)     Patient location during evaluation: PACU Anesthesia Type: General Level of consciousness: awake and alert Pain management: pain level controlled Vital Signs Assessment: post-procedure vital signs reviewed and stable Respiratory status: spontaneous breathing, nonlabored ventilation and respiratory function stable Cardiovascular status: blood pressure returned to baseline and stable Postop Assessment: no apparent nausea or vomiting Anesthetic complications: no    Last Vitals:  Vitals:   11/20/17 1244 11/20/17 1245  BP: (!) 146/72   Pulse: 64 65  Resp: 12 12  Temp:    SpO2: 100% 100%    Last Pain:  Vitals:   11/20/17 1200  TempSrc:   PainSc: [place]
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
## 2017-11-20 NOTE — Anesthesia Procedure Notes (Signed)
Procedure Name: Intubation Date/Time: 11/20/2017 9:10 AM Performed by: Trinna Post., CRNA Pre-anesthesia Checklist: Patient identified, Emergency Drugs available, Suction available, Patient being monitored and Timeout performed Patient Re-evaluated:Patient Re-evaluated prior to induction Oxygen Delivery Method: Circle system utilized Preoxygenation: Pre-oxygenation with 100% oxygen Induction Type: IV induction Ventilation: Mask ventilation without difficulty Laryngoscope Size: Mac and 3 Grade View: Grade I Tube type: Oral Tube size: 7.0 mm Number of attempts: 1 Airway Equipment and Method: Stylet Placement Confirmation: ETT inserted through vocal cords under direct vision,  positive ETCO2 and breath sounds checked- equal and bilateral Secured at: 22 cm Tube secured with: Tape Dental Injury: Teeth and Oropharynx as per pre-operative assessment

## 2017-11-20 NOTE — Progress Notes (Signed)
Pt. Lives with a family member that is not involved in her meds. She reports that she takes her "diabetic meds." sorta according to her diet.  Pt. Reports that she is no longer taking med. for cholesterol., she has the med. But reports " I just dont take it".  Pt. Reports that she is only taking one pill for BP, doesn't know the name of the med. .  Explanation given to pt. Based on recent CBG's she needs to get a clear understanding  Of her med. Schedule.

## 2017-11-21 ENCOUNTER — Encounter (HOSPITAL_COMMUNITY): Payer: Self-pay | Admitting: General Surgery

## 2017-11-21 DIAGNOSIS — I10 Essential (primary) hypertension: Secondary | ICD-10-CM | POA: Diagnosis not present

## 2017-11-21 DIAGNOSIS — Z452 Encounter for adjustment and management of vascular access device: Secondary | ICD-10-CM | POA: Diagnosis not present

## 2017-11-21 DIAGNOSIS — E119 Type 2 diabetes mellitus without complications: Secondary | ICD-10-CM | POA: Diagnosis not present

## 2017-11-21 DIAGNOSIS — Z7984 Long term (current) use of oral hypoglycemic drugs: Secondary | ICD-10-CM | POA: Diagnosis not present

## 2017-11-21 DIAGNOSIS — C50812 Malignant neoplasm of overlapping sites of left female breast: Secondary | ICD-10-CM | POA: Diagnosis not present

## 2017-11-21 DIAGNOSIS — E785 Hyperlipidemia, unspecified: Secondary | ICD-10-CM | POA: Diagnosis not present

## 2017-11-21 DIAGNOSIS — R531 Weakness: Secondary | ICD-10-CM | POA: Diagnosis not present

## 2017-11-21 DIAGNOSIS — C773 Secondary and unspecified malignant neoplasm of axilla and upper limb lymph nodes: Secondary | ICD-10-CM | POA: Diagnosis not present

## 2017-11-21 DIAGNOSIS — Z171 Estrogen receptor negative status [ER-]: Secondary | ICD-10-CM | POA: Diagnosis not present

## 2017-11-21 DIAGNOSIS — Z9221 Personal history of antineoplastic chemotherapy: Secondary | ICD-10-CM | POA: Diagnosis not present

## 2017-11-21 DIAGNOSIS — Z79899 Other long term (current) drug therapy: Secondary | ICD-10-CM | POA: Diagnosis not present

## 2017-11-21 LAB — CREATININE, SERUM
Creatinine, Ser: 0.47 mg/dL (ref 0.44–1.00)
GFR calc non Af Amer: >60 mL/min (ref >60)

## 2017-11-21 LAB — CBC
HEMATOCRIT: 32.2 % — AB (ref 36.0–46.0)
Hemoglobin: 10.3 g/dL — ABNORMAL LOW (ref 12.0–15.0)
MCH: 35.9 pg — ABNORMAL HIGH (ref 26.0–34.0)
MCHC: 32 g/dL (ref 30.0–36.0)
MCV: 112.2 fL — ABNORMAL HIGH (ref 80.0–100.0)
NRBC: 0 % (ref 0.0–0.2)
Platelets: 186 10*3/uL (ref 150–400)
RBC: 2.87 MIL/uL — AB (ref 3.87–5.11)
RDW: 14.9 % (ref 11.5–15.5)
WBC: 7.4 10*3/uL (ref 4.0–10.5)

## 2017-11-21 LAB — GLUCOSE, CAPILLARY
GLUCOSE-CAPILLARY: 127 mg/dL — AB (ref 70–99)
GLUCOSE-CAPILLARY: 221 mg/dL — AB (ref 70–99)

## 2017-11-21 MED ORDER — TRAMADOL HCL 50 MG PO TABS: 50.0000 mg | ORAL_TABLET | Freq: Four times a day (QID) | ORAL | 0 refills | Status: DC | PRN

## 2017-11-21 MED ORDER — HYDROCODONE-ACETAMINOPHEN 5-325 MG PO TABS: 1.0000 | ORAL_TABLET | ORAL | 0 refills | Status: DC | PRN
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
## 2017-11-21 NOTE — Plan of Care (Signed)
  Problem: Pain Managment: Goal: General experience of comfort will improve Outcome: Progressing   

## 2017-11-21 NOTE — Discharge Instructions (Signed)
[facility] [place] °[phone] ° °MASTECTOMY: POST OP INSTRUCTIONS ° °Always review your discharge instruction sheet given to you by the facility where your surgery was performed. °IF YOU HAVE DISABILITY OR FAMILY LEAVE FORMS, YOU MUST BRING THEM TO THE OFFICE FOR PROCESSING.   °DO NOT GIVE THEM TO YOUR DOCTOR. °A prescription for pain medication may be given to you upon discharge.  Take your pain medication as prescribed, if needed.  If narcotic pain medicine is not needed, then you may take acetaminophen (Tylenol) or ibuprofen (Advil) as needed. °1. Take your usually prescribed medications unless otherwise directed. °2. If you need a refill on your pain medication, please contact your pharmacy.  They will contact our office to request authorization.  Prescriptions will not be filled after 5pm or on week-ends. °3. You should follow a light diet the first few days after arrival home, such as soup and crackers, etc.  Resume your normal diet the day after surgery. °4. Most patients will experience some swelling and bruising on the chest and underarm.  Ice packs will help.  Swelling and bruising can take several days to resolve.  °5. It is common to experience some constipation if taking pain medication after surgery.  Increasing fluid intake and taking a stool softener (such as Colace) will usually help or prevent this problem from occurring.  A mild laxative (Milk of Magnesia or Miralax) should be taken according to package instructions if there are no bowel movements after 48 hours. °6. Unless discharge instructions indicate otherwise, leave your bandage dry and in place until your next appointment in 3-5 days.  You may take a limited sponge bath.  No tube baths or showers until the drains are removed.  You may have steri-strips (small skin tapes) in place directly over the incision.  These strips should be left on the skin for 7-10 days.  If your surgeon used skin glue on the incision, you may  shower in 24 hours.  The glue will flake off over the next 2-3 weeks.  Any sutures or staples will be removed at the office during your follow-up visit. °7. DRAINS:  If you have drains in place, it is important to keep a list of the amount of drainage produced each day in your drains.  Before leaving the hospital, you should be instructed on drain care.  Call our office if you have any questions about your drains. °8. ACTIVITIES:  You may resume regular (light) daily activities beginning the next day--such as daily self-care, walking, climbing stairs--gradually increasing activities as tolerated.  You may have sexual intercourse when it is comfortable.  Refrain from any heavy lifting or straining until approved by your doctor. °a. You may drive when you are no longer taking prescription pain medication, you can comfortably wear a seatbelt, and you can safely maneuver your car and apply brakes. °b. RETURN TO WORK:  __________________________________________________________ °9. You should see your doctor in the office for a follow-up appointment approximately 3-5 days after your surgery.  Your doctor’s nurse will typically make your follow-up appointment when she calls you with your pathology report.  Expect your pathology report 2-3 business days after your surgery.  You may call to check if you do not hear from us after three days.   °10. OTHER INSTRUCTIONS: ______________________________________________________________________________________________ ____________________________________________________________________________________________ °WHEN TO CALL YOUR DOCTOR: °1. Fever over 101.0 °2. Nausea and/or vomiting °3. Extreme swelling or bruising °4. Continued bleeding from incision. °5. Increased pain, redness, or drainage from the incision. °  The clinic staff is available to answer your questions during regular business hours.  Please don’t hesitate to call and ask to speak to one of the nurses for clinical  concerns.  If you have a medical emergency, go to the nearest emergency room or call 911.  A surgeon from [facility] is always on call at the hospital. °[address], [address], [place], [place]  [address] ? [address] [address], [place], [place]   [address] °[phone] ? [phone] ? FAX [phone] °Web site: [email or web address] °

## 2017-12-06 ENCOUNTER — Other Ambulatory Visit: Payer: Self-pay | Admitting: *Deleted

## 2017-12-06 DIAGNOSIS — Z170 Estrogen receptor positive status [ER+]: Secondary | ICD-10-CM

## 2017-12-06 DIAGNOSIS — C50212 Malignant neoplasm of upper-inner quadrant of left female breast: Secondary | ICD-10-CM

## 2017-12-11 ENCOUNTER — Inpatient Hospital Stay (HOSPITAL_BASED_OUTPATIENT_CLINIC_OR_DEPARTMENT_OTHER): Payer: Medicaid Other | Admitting: Hematology

## 2017-12-11 ENCOUNTER — Inpatient Hospital Stay: Payer: Medicaid Other | Attending: Nurse Practitioner

## 2017-12-11 ENCOUNTER — Telehealth: Payer: Self-pay | Admitting: Hematology

## 2017-12-11 ENCOUNTER — Encounter: Payer: Self-pay | Admitting: Hematology

## 2017-12-11 VITALS — BP 126/58 | HR 62 | Temp 98.6°F | Resp 18 | Ht 59.0 in | Wt 126.4 lb

## 2017-12-11 DIAGNOSIS — I10 Essential (primary) hypertension: Secondary | ICD-10-CM | POA: Insufficient documentation

## 2017-12-11 DIAGNOSIS — C773 Secondary and unspecified malignant neoplasm of axilla and upper limb lymph nodes: Secondary | ICD-10-CM

## 2017-12-11 DIAGNOSIS — E119 Type 2 diabetes mellitus without complications: Secondary | ICD-10-CM | POA: Diagnosis not present

## 2017-12-11 DIAGNOSIS — Z7984 Long term (current) use of oral hypoglycemic drugs: Secondary | ICD-10-CM

## 2017-12-11 DIAGNOSIS — Z170 Estrogen receptor positive status [ER+]: Secondary | ICD-10-CM

## 2017-12-11 DIAGNOSIS — Z9012 Acquired absence of left breast and nipple: Secondary | ICD-10-CM | POA: Diagnosis not present

## 2017-12-11 DIAGNOSIS — C50212 Malignant neoplasm of upper-inner quadrant of left female breast: Secondary | ICD-10-CM

## 2017-12-11 DIAGNOSIS — Z79899 Other long term (current) drug therapy: Secondary | ICD-10-CM | POA: Insufficient documentation

## 2017-12-11 LAB — CBC WITH DIFFERENTIAL (CANCER CENTER ONLY)
Abs Immature Granulocytes: 0.02 10*3/uL (ref 0.00–0.07)
Basophils Absolute: 0.1 10*3/uL (ref 0.0–0.1)
Basophils Relative: 1 %
EOS ABS: 3.5 10*3/uL — AB (ref 0.0–0.5)
EOS PCT: 35 %
HEMATOCRIT: 36.6 % (ref 36.0–46.0)
HEMOGLOBIN: 12 g/dL (ref 12.0–15.0)
Immature Granulocytes: 0 %
LYMPHS ABS: 2.2 10*3/uL (ref 0.7–4.0)
LYMPHS PCT: 23 %
MCH: 34.8 pg — AB (ref 26.0–34.0)
MCHC: 32.8 g/dL (ref 30.0–36.0)
MCV: 106.1 fL — AB (ref 80.0–100.0)
MONO ABS: 0.7 10*3/uL (ref 0.1–1.0)
MONOS PCT: 7 %
Neutro Abs: 3.2 10*3/uL (ref 1.7–7.7)
Neutrophils Relative %: 34 %
Platelet Count: 206 10*3/uL (ref 150–400)
RBC: 3.45 MIL/uL — ABNORMAL LOW (ref 3.87–5.11)
RDW: 13.2 % (ref 11.5–15.5)
WBC Count: 9.7 10*3/uL (ref 4.0–10.5)
nRBC: 0 % (ref 0.0–0.2)

## 2017-12-11 LAB — CMP (CANCER CENTER ONLY)
ALK PHOS: 95 U/L (ref 38–126)
ALT: 26 U/L (ref 0–44)
AST: 49 U/L — AB (ref 15–41)
Albumin: 3.7 g/dL (ref 3.5–5.0)
Anion gap: 9 (ref 5–15)
BUN: 10 mg/dL (ref 8–23)
CALCIUM: 9.9 mg/dL (ref 8.9–10.3)
CO2: 25 mmol/L (ref 22–32)
CREATININE: 0.68 mg/dL (ref 0.44–1.00)
Chloride: 105 mmol/L (ref 98–111)
GFR, Estimated: >60 mL/min (ref >60)
Glucose, Bld: 149 mg/dL — ABNORMAL HIGH (ref 70–99)
Potassium: 3.8 mmol/L (ref 3.5–5.1)
SODIUM: 139 mmol/L (ref 135–145)
Total Bilirubin: 0.4 mg/dL (ref 0.3–1.2)
Total Protein: 8.1 g/dL (ref 6.5–8.1)

## 2017-12-11 NOTE — Telephone Encounter (Signed)
Printed calendar and avs. °
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
## 2017-12-18 ENCOUNTER — Ambulatory Visit
Admission: RE | Admit: 2017-12-18 | Discharge: 2017-12-18 | Disposition: A | Discharge status: Home / Self Care | Payer: Medicaid Other | Source: Ambulatory Visit | Attending: Radiation Oncology | Admitting: Radiation Oncology

## 2017-12-18 ENCOUNTER — Encounter: Payer: Self-pay | Admitting: Radiation Oncology

## 2017-12-18 ENCOUNTER — Other Ambulatory Visit: Payer: Self-pay

## 2017-12-18 VITALS — BP 132/71 | HR 69 | Temp 98.5°F | Resp 18 | Wt 125.1 lb

## 2017-12-18 DIAGNOSIS — Z170 Estrogen receptor positive status [ER+]: Secondary | ICD-10-CM

## 2017-12-18 DIAGNOSIS — Z9221 Personal history of antineoplastic chemotherapy: Secondary | ICD-10-CM | POA: Diagnosis not present

## 2017-12-18 DIAGNOSIS — C50212 Malignant neoplasm of upper-inner quadrant of left female breast: Secondary | ICD-10-CM | POA: Diagnosis not present

## 2017-12-18 DIAGNOSIS — Z9012 Acquired absence of left breast and nipple: Secondary | ICD-10-CM | POA: Diagnosis not present

## 2017-12-18 DIAGNOSIS — Z79899 Other long term (current) drug therapy: Secondary | ICD-10-CM | POA: Insufficient documentation

## 2017-12-18 DIAGNOSIS — Z171 Estrogen receptor negative status [ER-]: Secondary | ICD-10-CM | POA: Diagnosis not present

## 2017-12-18 DIAGNOSIS — C773 Secondary and unspecified malignant neoplasm of axilla and upper limb lymph nodes: Secondary | ICD-10-CM | POA: Diagnosis not present

## 2017-12-18 NOTE — Progress Notes (Signed)
Radiation Oncology         (336) [phone] ________________________________  Initial Outpatient Consultation  Name: Amanda Duarte MRN: [id number]  Date: 12/18/2017  DOB: [birth year]  NO:BSJGGEZ, Vernia Buff, NP  [name], MD   REFERRING PHYSICIAN: [name], MD  DIAGNOSIS: The encounter diagnosis was Malignant neoplasm of upper-inner quadrant of left breast in female, estrogen receptor positive ([place]).  Malignant neoplasm of upper-inner quadrant of left breast in female, estrogen receptor positive (HCC) ypT0, pN78m, cM0, GX, ER-, PR-, HER2- Prognostic indicators significant for: ER, 0% negative and PR, 0% negative. HER2 negative.  HISTORY OF PRESENT ILLNESS::[name] a 62y.o. female who is presenting to the office today for evaluation of left breast malignant neoplasm with lymph node metastasis post mastectomy and chemotherapy. She is accompanied by her daughter in lSports coachand a CDentist She previously saw Dr. FBurr Medicofor chemotherapy treatments, which were completed on 10/20/17.  Following neoadjuvant chemotherapy treatment, she had a  left modified radical mastectomy on 11/20/17, which showed microscopic foci of residual metastatic carcinoma involving two of eight lymph nodes (2/8); largest continuous focus measures 0.1cm. There was no evidence of residual carcinoma in the mastectomy specimen. There were marked therapy-related changes, including dense hyalin fibrosis.  On 10/24/17, she underwent a bilateral breast MRI with and without contrast which revealed no residual enhancement in the LEFT breast following neoadjuvant treatment. The scan also showed significantly smaller LEFT axillary lymph nodes, largest now measuring 1.4 centimeters. The remainder of LEFT axillary lymph nodes demonstrate normal fatty hila.  she reports associated numbness in her right arm. she denies pain in her right side, headaches, back pain and any other symptoms. She denies taking any medicine  for pain. She has some improved movement in raising her arms above her head.   PREVIOUS RADIATION THERAPY: No  PAST MEDICAL HISTORY:  has a past medical history of Cancer ([place] ([place]), Diabetes mellitus without complication ([facility], Hyperlipidemia, Hypertension, and Tibial plateau fracture, left.    PAST SURGICAL HISTORY: Past Surgical History:  Procedure Laterality Date  . MASTECTOMY MODIFIED RADICAL Left 11/20/2017   Procedure: LEFT MODIFIED RADICAL MASTECTOMY;  Surgeon: IFanny Skates MD;  [place]  Service: General;  Laterality: Left;  . ORIF TIBIA PLATEAU Left 04/13/2017   Procedure: OPEN REDUCTION INTERNAL FIXATION (ORIF) TIBIAL PLATEAU;  Surgeon: HShona Needles MD;  [place]  Service: Orthopedics;  Laterality: Left;  . PORT-A-CATH REMOVAL Right 11/20/2017   Procedure: REMOVAL PORT-A-CATH;  Surgeon: IFanny Skates MD;  [place]  Service: General;  Laterality: Right;  . PORTACATH PLACEMENT Right 05/31/2017   Procedure: INSERTION PORT-A-CATH;  Surgeon: IFanny Skates MD;  [place]  Service: General;  Laterality: Right;    FAMILY HISTORY: family history includes Diabetes in her son.  SOCIAL HISTORY:  reports that she has never smoked. She has never used smokeless tobacco. She reports that she does not drink alcohol or use drugs.  ALLERGIES: Patient has no known allergies.  MEDICATIONS:  Current Outpatient Medications  Medication Sig Dispense Refill  . atorvastatin (LIPITOR) 20 MG tablet Take 1 tablet (20 mg total) by mouth daily. 30 tablet 3  . Blood Glucose Monitoring Suppl (ONE TOUCH ULTRA 2) w/Device KIT Please check blood sugar by fingerstick once in the morning before you eat and once in the evening 1 hour after you eat. 1 each 0  . cyclobenzaprine (FLEXERIL) 5 MG tablet Take 1 tablet (5 mg total) by mouth 3 (three)  times daily as needed for muscle spasms. 30 tablet 1  . glimepiride (AMARYL) 4 MG tablet Take 1 tablet (4  mg total) by mouth daily before breakfast. 90 tablet 2  . glucose blood test strip Use as instructed 100 each 12  . hydrochlorothiazide (HYDRODIURIL) 25 MG tablet Take 1 tablet (25 mg total) by mouth daily. 30 tablet 3  . HYDROcodone-acetaminophen (NORCO) 5-325 MG tablet Take 1-2 tablets by mouth every 6 (six) hours as needed for moderate pain or severe pain. 30 tablet 0  . lidocaine-prilocaine (EMLA) cream Apply to affected area once 30 g 3  . LORazepam (ATIVAN) 0.5 MG tablet Take 1 tablet (0.5 mg total) by mouth every 6 (six) hours as needed (Nausea or vomiting). 30 tablet 0  . losartan (COZAAR) 50 MG tablet Take 1 tablet (50 mg total) by mouth daily. 30 tablet 3  . ondansetron (ZOFRAN) 8 MG tablet Take 1 tablet (8 mg total) by mouth 2 (two) times daily as needed. Start on the third day after chemotherapy. 30 tablet 1  . potassium chloride SA (K-DUR,KLOR-CON) 20 MEQ tablet Take 1 tablet (20 mEq total) by mouth daily. 20 tablet 0  . prochlorperazine (COMPAZINE) 10 MG tablet Take 1 tablet (10 mg total) by mouth every 6 (six) hours as needed (Nausea or vomiting). 30 tablet 1  . senna (SENOKOT) 8.6 MG TABS tablet Take 1 tablet (8.6 mg total) by mouth daily as needed for mild constipation. 10 each 0  . traMADol (ULTRAM) 50 MG tablet Take 1 tablet (50 mg total) by mouth every 6 (six) hours as needed for moderate pain or severe pain. 20 tablet 0  . omega-3 acid ethyl esters (LOVAZA) 1 g capsule Take 1 capsule (1 g total) by mouth 2 (two) times daily. 60 capsule 3  . sitaGLIPtin-metformin (JANUMET) 50-1000 MG tablet Take 1 tablet by mouth 2 (two) times daily with a meal. 180 tablet 1   No current facility-administered medications for this encounter.     REVIEW OF SYSTEMS:  A 10+ POINT REVIEW OF SYSTEMS WAS OBTAINED including neurology, dermatology, psychiatry, cardiac, respiratory, lymph, extremities, GI, GU, musculoskeletal, constitutional, reproductive, HEENT. All pertinent positives are noted in the  HPI. All others are negative.    PHYSICAL EXAM:  weight is 125 lb 2 oz (56.8 kg). Her oral temperature is 98.5 F (36.9 C). Her blood pressure is 132/71 and her pulse is 69. Her respiration is 18 and oxygen saturation is 97%.   General: Alert and oriented, in no acute distress HEENT: Head is normocephalic. Extraocular movements are intact. Oropharynx is clear. Gold-tipped front teeth Neck: Neck is supple, no palpable cervical or supraclavicular lymphadenopathy. Heart: Regular in rate and rhythm with no murmurs, rubs, or gallops. Chest: Clear to auscultation bilaterally, with no rhonchi, wheezes, or rales. Abdomen: Soft, nontender, nondistended, with no rigidity or guarding. Extremities: No cyanosis or edema. Soft cast present on her left knee area. Lymphatics: see Neck Exam Skin: No concerning lesions. Port-a-cath site on right upper chest, healing well without signs of drainage or infection.  Musculoskeletal: symmetric strength and muscle tone throughout.  Neurologic: Cranial nerves II through XII are grossly intact. No obvious focalities. Speech is fluent. Coordination is intact. Psychiatric: Judgment and insight are intact. Affect is appropriate. Right breast with no palpable mass, nipple discharge, or bleeding. Mastectomy scar along the left side which is healing well, with no sings of drainage or infection.    ECOG = 1  0 - Asymptomatic (Fully active, able  to carry on all predisease activities without restriction)  1 - Symptomatic but completely ambulatory (Restricted in physically strenuous activity but ambulatory and able to carry out work of a light or sedentary nature. For example, light housework, office work)  2 - Symptomatic, <50% in bed during the day (Ambulatory and capable of all self care but unable to carry out any work activities. Up and about more than 50% of waking hours)  3 - Symptomatic, >50% in bed, but not bedbound (Capable of only limited self-care, confined to  bed or chair 50% or more of waking hours)  4 - Bedbound (Completely disabled. Cannot carry on any self-care. Totally confined to bed or chair)  5 - Death   Eustace Pen MM, Creech RH, Tormey DC, et al. [phone]). "Toxicity and response criteria of the [facility]". [place] Oncol. 5 (6): 649-55  LABORATORY DATA:  Lab Results  Component Value Date   WBC 9.7 12/11/2017   HGB 12.0 12/11/2017   HCT 36.6 12/11/2017   MCV 106.1 (H) 12/11/2017   PLT 206 12/11/2017   NEUTROABS 3.2 12/11/2017   Lab Results  Component Value Date   NA 139 12/11/2017   K 3.8 12/11/2017   CL 105 12/11/2017   CO2 25 12/11/2017   GLUCOSE 149 (H) 12/11/2017   CREATININE 0.68 12/11/2017   CALCIUM 9.9 12/11/2017      RADIOGRAPHY: No results found.    IMPRESSION: Malignant neoplasm of upper-inner quadrant of left breast in female, estrogen receptor positive (HCC) ypT0, pN89m, cM0, GX, ER-, PR-, HER2-  Patient proceeded with neoadjuvant chemotherapy and had a good response to her treatment based on imaging and pathology reports. Patient then underwent a left modified radical mastectomy. Prior to neoadjuvant chemotherapy she had biopsy-proven left axillary lymph nodes. Pt would be a good candidate for post-mastectomy radiation therapy given her node-positive disease.   Today, I talked to the patient and her daughter-in-law  about the findings and work-up thus far.  We discussed the natural history of her breast cancer and general treatment, highlighting the role of radiotherapy in the management.  We discussed the available radiation techniques, and focused on the details of logistics and delivery.  We reviewed the anticipated acute and late sequelae associated with radiation in this setting.  The patient was encouraged to ask questions that I answered to the best of my ability.  A patient consent form was discussed and signed.  We retained a copy for our records.  The patient would like to proceed  with radiation and will be scheduled for CT simulation.   PLAN:[facility] and to last for approximately 6 weeks. Treatments will include the left chest wall as well as the high axilla/supraclavicular region.   ------------------------------------------------  JBlair Promise PhD, MD  This document serves as a record of services personally performed by JGery Pray MD. It was created on his behalf by Mary-Margaret CLoma Messing a trained medical scribe. The creation of this record is based on the scribe's personal observations and the provider's statements to them. This document has been checked and approved by the attending provider.

## 2017-12-19 ENCOUNTER — Encounter: Payer: Self-pay | Admitting: Radiation Oncology

## 2017-12-25 ENCOUNTER — Ambulatory Visit
Admission: RE | Admit: 2017-12-25 | Discharge: 2017-12-25 | Disposition: A | Discharge status: Home / Self Care | Payer: Medicaid Other | Source: Ambulatory Visit | Attending: Radiation Oncology | Admitting: Radiation Oncology

## 2017-12-25 DIAGNOSIS — C50212 Malignant neoplasm of upper-inner quadrant of left female breast: Secondary | ICD-10-CM | POA: Insufficient documentation

## 2017-12-25 DIAGNOSIS — Z510 Encounter for antineoplastic radiation therapy: Secondary | ICD-10-CM | POA: Insufficient documentation

## 2017-12-25 DIAGNOSIS — Z170 Estrogen receptor positive status [ER+]: Secondary | ICD-10-CM | POA: Diagnosis not present

## 2017-12-25 NOTE — Progress Notes (Signed)
  Radiation Oncology         [phone]) [phone] ________________________________  Name: Amanda Duarte MRN: [id number]  Date: 12/25/2017  DOB: 1955/10/19  SIMULATION AND TREATMENT PLANNING NOTE    ICD-10-CM   1. Malignant neoplasm of upper-inner quadrant of left breast in female, estrogen receptor positive ([place]) C50.212    Z17.0     DIAGNOSIS:  Malignant neoplasm of upper-inner quadrant of left breast in female, estrogen receptor positive ([place]).  Malignant neoplasm of upper-inner quadrant of left breast in female, estrogen receptor positive (HCC) ypT0, pN78m, cM0, GX, ER-, PR-, HER2-  NARRATIVE:  [facility] [place] [facility] [place] [facility]  [place].  All relevant records and images related to the planned course of therapy were reviewed.  The patient freely provided informed written consent to proceed with treatment after reviewing the details related to the planned course of therapy. The consent form was witnessed and verified by the simulation staff.  Then, the patient was set-up in a stable reproducible  supine position for radiation therapy.  CT images were obtained.  Surface markings were placed.  The CT images were loaded into the planning software.  Then the target and avoidance structures were contoured.  Treatment planning then occurred.  The radiation prescription was entered and confirmed.  Then, I designed and supervised the construction of a total of 4 medically necessary complex treatment devices.  I have requested : 3D Simulation  I have requested a DVH of the following structures: heart, lungs, chest wall volume.  I have ordered:dose calc.  PLAN:  The patient will receive 50 Gy in 25 fractions directed at the left chest wall area. The high axilla/supraclavicular region received 45 gray in 25 fractions. Patient will then proceed with a boost to the mastectomy scar region of 10 gray in 5 fractions for cumulative dose to this area of 60 Gy.   Optical  Surface Tracking Plan:  Since intensity modulated radiotherapy (IMRT) and 3D conformal radiation treatment methods are predicated on accurate and precise positioning for treatment, intrafraction motion monitoring is medically necessary to ensure accurate and safe treatment delivery.  The ability to quantify intrafraction motion without excessive ionizing radiation dose can only be performed with optical surface tracking. Accordingly, surface imaging offers the opportunity to obtain 3D measurements of patient position throughout IMRT and 3D treatments without excessive radiation exposure.  I am ordering optical surface tracking for this patient's upcoming course of radiotherapy. ________________________________    -----------------------------------  JBlair Promise PhD, MD  This document serves as a record of services personally performed by JGery Pray MD. It was created on his behalf by Mary-Margaret CLoma Messing a trained medical scribe. The creation of this record is based on the scribe's personal observations and the provider's statements to them. This document has been checked and approved by the attending provider.

## 2017-12-26 ENCOUNTER — Other Ambulatory Visit: Payer: Self-pay

## 2017-12-26 ENCOUNTER — Encounter: Payer: Self-pay | Admitting: Nurse Practitioner

## 2017-12-26 ENCOUNTER — Ambulatory Visit: Payer: Medicaid Other | Attending: Nurse Practitioner | Admitting: Nurse Practitioner

## 2017-12-26 VITALS — BP 136/78 | HR 71 | Temp 98.7°F | Resp 16 | Wt 127.0 lb

## 2017-12-26 DIAGNOSIS — Z7984 Long term (current) use of oral hypoglycemic drugs: Secondary | ICD-10-CM | POA: Diagnosis not present

## 2017-12-26 DIAGNOSIS — Z9012 Acquired absence of left breast and nipple: Secondary | ICD-10-CM | POA: Insufficient documentation

## 2017-12-26 DIAGNOSIS — Z833 Family history of diabetes mellitus: Secondary | ICD-10-CM | POA: Insufficient documentation

## 2017-12-26 DIAGNOSIS — I10 Essential (primary) hypertension: Secondary | ICD-10-CM

## 2017-12-26 DIAGNOSIS — Z9889 Other specified postprocedural states: Secondary | ICD-10-CM | POA: Diagnosis not present

## 2017-12-26 DIAGNOSIS — Z79899 Other long term (current) drug therapy: Secondary | ICD-10-CM | POA: Diagnosis not present

## 2017-12-26 DIAGNOSIS — Z01419 Encounter for gynecological examination (general) (routine) without abnormal findings: Secondary | ICD-10-CM | POA: Diagnosis not present

## 2017-12-26 DIAGNOSIS — Z124 Encounter for screening for malignant neoplasm of cervix: Secondary | ICD-10-CM

## 2017-12-26 DIAGNOSIS — E781 Pure hyperglyceridemia: Secondary | ICD-10-CM | POA: Diagnosis not present

## 2017-12-26 DIAGNOSIS — E118 Type 2 diabetes mellitus with unspecified complications: Secondary | ICD-10-CM | POA: Diagnosis not present

## 2017-12-26 MED ORDER — LOSARTAN POTASSIUM 50 MG PO TABS: 50.0000 mg | ORAL_TABLET | Freq: Every day | ORAL | 3 refills | Status: DC

## 2017-12-26 MED ORDER — HYDROCHLOROTHIAZIDE 25 MG PO TABS: 25.0000 mg | ORAL_TABLET | Freq: Every day | ORAL | 3 refills | Status: DC

## 2017-12-26 MED ORDER — OMEGA-3-ACID ETHYL ESTERS 1 G PO CAPS: 1.0000 g | ORAL_CAPSULE | Freq: Two times a day (BID) | ORAL | 3 refills | Status: DC

## 2017-12-26 MED ORDER — ATORVASTATIN CALCIUM 20 MG PO TABS: 20.0000 mg | ORAL_TABLET | Freq: Every day | ORAL | 3 refills | Status: DC

## 2017-12-26 MED ORDER — SITAGLIPTIN PHOS-METFORMIN HCL 50-1000 MG PO TABS: 1.0000 | ORAL_TABLET | Freq: Two times a day (BID) | ORAL | 2 refills | Status: DC

## 2017-12-26 MED ORDER — GLIMEPIRIDE 4 MG PO TABS: 4.0000 mg | ORAL_TABLET | Freq: Every day | ORAL | 2 refills | Status: DC

## 2017-12-26 NOTE — Progress Notes (Signed)
Assessment & Plan:  Amanda Duarte was seen today for gynecologic exam.  Diagnoses and all orders for this visit:  Encounter for Papanicolaou smear for cervical cancer screening -     Cytology - PAP  Essential hypertension -     hydrochlorothiazide (HYDRODIURIL) 25 MG tablet; Take 1 tablet (25 mg total) by mouth daily. -     losartan (COZAAR) 50 MG tablet; Take 1 tablet (50 mg total) by mouth daily. Chronic and well controlled.  BP Readings from Last 3 Encounters:  12/26/17 136/78  12/18/17 132/71  12/11/17 (!) 126/58  Continue all antihypertensives as prescribed.  Remember to bring in your blood pressure log with you for your follow up appointment.  DASH/Mediterranean Diets are healthier choices for HTN.   Type 2 diabetes mellitus with complication, without long-term current use of insulin (HCC) -     sitaGLIPtin-metformin (JANUMET) 50-1000 MG tablet; Take 1 tablet by mouth 2 (two) times daily with a meal. -     glimepiride (AMARYL) 4 MG tablet; Take 1 tablet (4 mg total) by mouth daily before breakfast. Lab Results  Component Value Date   HGBA1C 6.2 (A) 09/26/2017  Chronic and well controlled. She denies any hypo or hyperglycemic symptoms. Taking all medications as prescribed.  Continue blood sugar control as discussed in office today, low carbohydrate diet, and regular physical exercise as tolerated, 150 minutes per week (30 min each day, 5 days per week, or 50 min 3 days per week). Keep blood sugar logs with fasting goal of 90-130 mg/dl, post prandial (after you eat) less than 180.  For Hypoglycemia: BS <60 and Hyperglycemia BS >400; contact the clinic ASAP. Annual eye exams and foot exams are recommended.   Pure hypertriglyceridemia -     atorvastatin (LIPITOR) 20 MG tablet; Take 1 tablet (20 mg total) by mouth daily. -     omega-3 acid ethyl esters (LOVAZA) 1 g capsule; Take 1 capsule (1 g total) by mouth 2 (two) times daily. Lab Results  Component Value Date   CHOL 198  09/26/2017   CHOL 196 09/07/2016   CHOL 190 05/19/2014   Lab Results  Component Value Date   HDL 40 09/26/2017   HDL 54 09/07/2016   HDL 41 05/19/2014   Lab Results  Component Value Date   LDLCALC Comment 09/26/2017   LDLCALC 82 09/07/2016   LDLCALC 95 05/19/2014   Lab Results  Component Value Date   TRIG 479 (H) 09/26/2017   TRIG 300 (H) 09/07/2016   TRIG 270 (H) 05/19/2014   Lab Results  Component Value Date   CHOLHDL 5.0 (H) 09/26/2017   CHOLHDL 3.6 09/07/2016   CHOLHDL 4.6 05/19/2014  INSTRUCTIONS: Work on a low fat, heart healthy diet and participate in regular aerobic exercise program by working out at least 150 minutes per week; 5 days a week-30 minutes per day. Avoid red meat, fried foods. junk foods, sodas, sugary drinks, unhealthy snacking, alcohol and smoking.  Drink at least 48oz of water per day and monitor your carbohydrate intake daily.      Patient has been counseled on age-appropriate routine health concerns for screening and prevention. These are reviewed and up-to-date. Referrals have been placed accordingly. Immunizations are up-to-date or declined.    Subjective:   HPI Chen Holzman 62 y.o. female presents to office today for PAP. She has complaints of generalized body aches and pains. She has been prescribed Tramadol by her Oncologist. I have instructed her to make she she takes it  as needed. Apparently she has only been taking sparingly.   Review of Systems  Constitutional: Negative.  Negative for chills, fever, malaise/fatigue and weight loss.  Respiratory: Negative.  Negative for cough, shortness of breath and wheezing.   Cardiovascular: Negative.  Negative for chest pain, orthopnea and leg swelling.  Gastrointestinal: Negative for abdominal pain.  Genitourinary: Negative.  Negative for flank pain.  Musculoskeletal: Positive for joint pain and myalgias.  Skin: Negative.  Negative for rash.  Psychiatric/Behavioral: Negative for suicidal  ideas.    Past Medical History:  Diagnosis Date  . Cancer ([place]) 03/2017   left breast cancer  . Diabetes mellitus without complication ([place])   . Hyperlipidemia   . Hypertension   . Tibial plateau fracture, left    04-13-17 had ORIF    Past Surgical History:  Procedure Laterality Date  . MASTECTOMY MODIFIED RADICAL Left 11/20/2017   Procedure: LEFT MODIFIED RADICAL MASTECTOMY;  Surgeon: Fanny Skates, MD;  Location: [place];  Service: General;  Laterality: Left;  . ORIF TIBIA PLATEAU Left 04/13/2017   Procedure: OPEN REDUCTION INTERNAL FIXATION (ORIF) TIBIAL PLATEAU;  Surgeon: Shona Needles, MD;  Location: [place];  Service: Orthopedics;  Laterality: Left;  . PORT-A-CATH REMOVAL Right 11/20/2017   Procedure: REMOVAL PORT-A-CATH;  Surgeon: Fanny Skates, MD;  Location: [place];  Service: General;  Laterality: Right;  . PORTACATH PLACEMENT Right 05/31/2017   Procedure: INSERTION PORT-A-CATH;  Surgeon: Fanny Skates, MD;  Location: [place];  Service: General;  Laterality: Right;    Family History  Problem Relation Age of Onset  . Diabetes Son   . Breast cancer Neg Hx     Social History Reviewed with no changes to be made today.   Outpatient Medications Prior to Visit  Medication Sig Dispense Refill  . lidocaine-prilocaine (EMLA) cream Apply to affected area once 30 g 3  . atorvastatin (LIPITOR) 20 MG tablet Take 1 tablet (20 mg total) by mouth daily. 30 tablet 3  . hydrochlorothiazide (HYDRODIURIL) 25 MG tablet Take 1 tablet (25 mg total) by mouth daily. 30 tablet 3  . losartan (COZAAR) 50 MG tablet Take 1 tablet (50 mg total) by mouth daily. 30 tablet 3  . Blood Glucose Monitoring Suppl (ONE TOUCH ULTRA 2) w/Device KIT Please check blood sugar by fingerstick once in the morning before you eat and once in the evening 1 hour after you eat. 1 each 0  . cyclobenzaprine (FLEXERIL) 5 MG tablet Take 1 tablet (5 mg total) by mouth 3 (three) times daily as needed for  muscle spasms. 30 tablet 1  . glucose blood test strip Use as instructed 100 each 12  . HYDROcodone-acetaminophen (NORCO) 5-325 MG tablet Take 1-2 tablets by mouth every 6 (six) hours as needed for moderate pain or severe pain. (Patient not taking: Reported on 12/26/2017) 30 tablet 0  . LORazepam (ATIVAN) 0.5 MG tablet Take 1 tablet (0.5 mg total) by mouth every 6 (six) hours as needed (Nausea or vomiting). (Patient not taking: Reported on 12/26/2017) 30 tablet 0  . ondansetron (ZOFRAN) 8 MG tablet Take 1 tablet (8 mg total) by mouth 2 (two) times daily as needed. Start on the third day after chemotherapy. (Patient not taking: Reported on 12/26/2017) 30 tablet 1  . potassium chloride SA (K-DUR,KLOR-CON) 20 MEQ tablet Take 1 tablet (20 mEq total) by mouth daily. (Patient not taking: Reported on 12/26/2017) 20 tablet 0  . prochlorperazine (COMPAZINE) 10 MG tablet Take 1 tablet (10 mg total) by  mouth every 6 (six) hours as needed (Nausea or vomiting). (Patient not taking: Reported on 12/26/2017) 30 tablet 1  . senna (SENOKOT) 8.6 MG TABS tablet Take 1 tablet (8.6 mg total) by mouth daily as needed for mild constipation. (Patient not taking: Reported on 12/26/2017) 10 each 0  . traMADol (ULTRAM) 50 MG tablet Take 1 tablet (50 mg total) by mouth every 6 (six) hours as needed for moderate pain or severe pain. 20 tablet 0  . glimepiride (AMARYL) 4 MG tablet Take 1 tablet (4 mg total) by mouth daily before breakfast. 90 tablet 2  . omega-3 acid ethyl esters (LOVAZA) 1 g capsule Take 1 capsule (1 g total) by mouth 2 (two) times daily. 60 capsule 3  . sitaGLIPtin-metformin (JANUMET) 50-1000 MG tablet Take 1 tablet by mouth 2 (two) times daily with a meal. 180 tablet 1   No facility-administered medications prior to visit.    No Known Allergies     Objective:    BP 136/78 (BP Location: Right Arm, Cuff Size: Normal)   Pulse 71   Temp 98.7 F (37.1 C) (Oral)   Resp 16   Wt 127 lb (57.6 kg)   SpO2 96%   BMI  25.65 kg/m  Wt Readings from Last 3 Encounters:  12/26/17 127 lb (57.6 kg)  12/18/17 125 lb 2 oz (56.8 kg)  12/11/17 126 lb 6.4 oz (57.3 kg)    Physical Exam  Constitutional: She is oriented to person, place, and time. She appears well-developed and well-nourished.  HENT:  Head: Normocephalic.  Cardiovascular: Normal rate, regular rhythm and normal heart sounds.  Pulmonary/Chest: Effort normal and breath sounds normal.  Abdominal: Soft. Bowel sounds are normal. Hernia confirmed negative in the right inguinal area and confirmed negative in the left inguinal area.  Genitourinary: Rectum normal, vagina normal and uterus normal. Rectal exam shows no external hemorrhoid. No labial fusion. There is no rash, tenderness, lesion or injury on the right labia. There is no rash, tenderness, lesion or injury on the left labia. Uterus is not deviated and not enlarged. Cervix exhibits no motion tenderness and no friability. Right adnexum displays no mass, no tenderness and no fullness. Left adnexum displays no mass, no tenderness and no fullness. No erythema, tenderness or bleeding in the vagina. No foreign body in the vagina. No signs of injury around the vagina. No vaginal discharge found.  Lymphadenopathy: No inguinal adenopathy noted on the right or left side.       Right: No inguinal adenopathy present.       Left: No inguinal adenopathy present.  Neurological: She is alert and oriented to person, place, and time.  Skin: Skin is warm and dry.  Psychiatric: She has a normal mood and affect. Her behavior is normal. Judgment and thought content normal.       Patient has been counseled extensively about nutrition and exercise as well as the importance of adherence with medications and regular follow-up. The patient was given clear instructions to go to ER or return to medical center if symptoms don't improve, worsen or new problems develop. The patient verbalized understanding.   Follow-up: Return in  about 3 months (around 03/27/2018) for DM/HTN.   [name], FNP-BC [facility] [place], [place]   12/26/2017, 1:46 PM

## 2017-12-26 NOTE — Progress Notes (Signed)
Here for PAP and HTN

## 2017-12-28 DIAGNOSIS — C50212 Malignant neoplasm of upper-inner quadrant of left female breast: Secondary | ICD-10-CM | POA: Diagnosis not present

## 2017-12-28 DIAGNOSIS — Z510 Encounter for antineoplastic radiation therapy: Secondary | ICD-10-CM | POA: Diagnosis not present

## 2017-12-28 LAB — CYTOLOGY - PAP
Bacterial vaginitis: NEGATIVE
CANDIDA VAGINITIS: NEGATIVE
CHLAMYDIA, DNA PROBE: NEGATIVE
Diagnosis: NEGATIVE
HPV (WINDOPATH): NOT DETECTED
Neisseria Gonorrhea: NEGATIVE
TRICH (WINDOWPATH): NEGATIVE

## 2018-01-01 ENCOUNTER — Ambulatory Visit
Admission: RE | Admit: 2018-01-01 | Discharge: 2018-01-01 | Disposition: A | Discharge status: Home / Self Care | Payer: Medicaid Other | Source: Ambulatory Visit | Attending: Radiation Oncology | Admitting: Radiation Oncology

## 2018-01-01 DIAGNOSIS — Z510 Encounter for antineoplastic radiation therapy: Secondary | ICD-10-CM | POA: Diagnosis not present

## 2018-01-01 DIAGNOSIS — C50212 Malignant neoplasm of upper-inner quadrant of left female breast: Secondary | ICD-10-CM | POA: Diagnosis not present

## 2018-01-01 DIAGNOSIS — Z170 Estrogen receptor positive status [ER+]: Secondary | ICD-10-CM

## 2018-01-01 NOTE — Progress Notes (Signed)
  Radiation Oncology         [phone] ________________________________  Name: Aricela Bertagnolli MRN: [id number]  Date: 01/01/2018  DOB: May 16, 1955  Simulation Verification Note    ICD-10-CM   1. Malignant neoplasm of upper-inner quadrant of left breast in female, estrogen receptor positive ([place]) C50.212    Z17.0     Status: outpatient  NARRATIVE: The patient was brought to the treatment unit and placed in the planned treatment position. The clinical setup was verified. Then port films were obtained and uploaded to the radiation oncology medical record software.  The treatment beams were carefully compared against the planned radiation fields. The position location and shape of the radiation fields was reviewed. They targeted volume of tissue appears to be appropriately covered by the radiation beams. Organs at risk appear to be excluded as planned.  Based on my personal review, I approved the simulation verification. The patient's treatment will proceed as planned.  -----------------------------------  Blair Promise, PhD, MD  This document serves as a record of services personally performed by Gery Pray, MD. It was created on his behalf by Mary-Margaret Loma Messing, a trained medical scribe. The creation of this record is based on the scribe's personal observations and the provider's statements to them. This document has been checked and approved by the attending provider.

## 2018-01-02 ENCOUNTER — Ambulatory Visit
Admission: RE | Admit: 2018-01-02 | Discharge: 2018-01-02 | Disposition: A | Discharge status: Home / Self Care | Payer: Medicaid Other | Source: Ambulatory Visit | Attending: Radiation Oncology | Admitting: Radiation Oncology

## 2018-01-02 DIAGNOSIS — Z170 Estrogen receptor positive status [ER+]: Secondary | ICD-10-CM

## 2018-01-02 DIAGNOSIS — Z510 Encounter for antineoplastic radiation therapy: Secondary | ICD-10-CM | POA: Diagnosis not present

## 2018-01-02 DIAGNOSIS — C50212 Malignant neoplasm of upper-inner quadrant of left female breast: Secondary | ICD-10-CM | POA: Diagnosis not present

## 2018-01-02 MED ORDER — RADIAPLEXRX EX GEL
Freq: Once | CUTANEOUS | Status: AC
  Administered 2018-01-02: 17:00:00 via TOPICAL

## 2018-01-02 MED ORDER — ALRA NON-METALLIC DEODORANT (RAD-ONC)
1.0000 "application " | Freq: Once | TOPICAL | Status: AC
  Administered 2018-01-02: 1 via TOPICAL

## 2018-01-02 NOTE — Progress Notes (Signed)
Pt here for patient teaching.  Pt given Radiation and You booklet, skin care instructions, Alra deodorant and Radiaplex gel.  Reviewed areas of pertinence such as fatigue, skin changes, breast tenderness and breast swelling . Pt able to give teach back of to pat skin and use unscented/gentle soap,apply Radiaplex bid, avoid applying anything to skin within 4 hours of treatment, avoid wearing an under wire bra and to use an electric razor if they must shave. Pt demonstrated understanding, verbalizes understanding and with the assistance of hospital interpreter Amanda Duarte of information given and will contact nursing with any questions or concerns.     [email or web address]    Loma Sousa, RN BSN

## 2018-01-03 ENCOUNTER — Ambulatory Visit
Admission: RE | Admit: 2018-01-03 | Discharge: 2018-01-03 | Disposition: A | Discharge status: Home / Self Care | Payer: Medicaid Other | Source: Ambulatory Visit | Attending: Radiation Oncology | Admitting: Radiation Oncology

## 2018-01-03 DIAGNOSIS — Z510 Encounter for antineoplastic radiation therapy: Secondary | ICD-10-CM | POA: Diagnosis not present

## 2018-01-03 DIAGNOSIS — C50212 Malignant neoplasm of upper-inner quadrant of left female breast: Secondary | ICD-10-CM | POA: Diagnosis not present

## 2018-01-04 ENCOUNTER — Ambulatory Visit
Admission: RE | Admit: 2018-01-04 | Discharge: 2018-01-04 | Disposition: A | Discharge status: Home / Self Care | Payer: Medicaid Other | Source: Ambulatory Visit | Attending: Radiation Oncology | Admitting: Radiation Oncology

## 2018-01-04 DIAGNOSIS — Z510 Encounter for antineoplastic radiation therapy: Secondary | ICD-10-CM | POA: Diagnosis not present

## 2018-01-04 DIAGNOSIS — C50212 Malignant neoplasm of upper-inner quadrant of left female breast: Secondary | ICD-10-CM | POA: Diagnosis not present

## 2018-01-05 ENCOUNTER — Ambulatory Visit
Admission: RE | Admit: 2018-01-05 | Discharge: 2018-01-05 | Disposition: A | Discharge status: Home / Self Care | Payer: Medicaid Other | Source: Ambulatory Visit | Attending: Radiation Oncology | Admitting: Radiation Oncology

## 2018-01-05 DIAGNOSIS — C50212 Malignant neoplasm of upper-inner quadrant of left female breast: Secondary | ICD-10-CM | POA: Diagnosis not present

## 2018-01-05 DIAGNOSIS — Z510 Encounter for antineoplastic radiation therapy: Secondary | ICD-10-CM | POA: Diagnosis not present

## 2018-01-08 ENCOUNTER — Ambulatory Visit
Admission: RE | Admit: 2018-01-08 | Discharge: 2018-01-08 | Disposition: A | Discharge status: Home / Self Care | Payer: Medicaid Other | Source: Ambulatory Visit | Attending: Radiation Oncology | Admitting: Radiation Oncology

## 2018-01-08 DIAGNOSIS — Z510 Encounter for antineoplastic radiation therapy: Secondary | ICD-10-CM | POA: Diagnosis not present

## 2018-01-08 DIAGNOSIS — C50212 Malignant neoplasm of upper-inner quadrant of left female breast: Secondary | ICD-10-CM | POA: Diagnosis not present

## 2018-01-09 ENCOUNTER — Ambulatory Visit
Admission: RE | Admit: 2018-01-09 | Discharge: 2018-01-09 | Disposition: A | Discharge status: Home / Self Care | Payer: Medicaid Other | Source: Ambulatory Visit | Attending: Radiation Oncology | Admitting: Radiation Oncology

## 2018-01-09 DIAGNOSIS — C50212 Malignant neoplasm of upper-inner quadrant of left female breast: Secondary | ICD-10-CM | POA: Diagnosis not present

## 2018-01-09 DIAGNOSIS — Z510 Encounter for antineoplastic radiation therapy: Secondary | ICD-10-CM | POA: Diagnosis not present

## 2018-01-10 ENCOUNTER — Ambulatory Visit
Admission: RE | Admit: 2018-01-10 | Discharge: 2018-01-10 | Disposition: A | Discharge status: Home / Self Care | Payer: Medicaid Other | Source: Ambulatory Visit | Attending: Radiation Oncology | Admitting: Radiation Oncology

## 2018-01-10 DIAGNOSIS — C50212 Malignant neoplasm of upper-inner quadrant of left female breast: Secondary | ICD-10-CM | POA: Diagnosis not present

## 2018-01-10 DIAGNOSIS — Z510 Encounter for antineoplastic radiation therapy: Secondary | ICD-10-CM | POA: Diagnosis not present

## 2018-01-11 ENCOUNTER — Ambulatory Visit
Admission: RE | Admit: 2018-01-11 | Discharge: 2018-01-11 | Disposition: A | Discharge status: Home / Self Care | Payer: Medicaid Other | Source: Ambulatory Visit | Attending: Radiation Oncology | Admitting: Radiation Oncology

## 2018-01-11 DIAGNOSIS — Z510 Encounter for antineoplastic radiation therapy: Secondary | ICD-10-CM | POA: Diagnosis not present

## 2018-01-11 DIAGNOSIS — C50212 Malignant neoplasm of upper-inner quadrant of left female breast: Secondary | ICD-10-CM | POA: Diagnosis not present

## 2018-01-12 ENCOUNTER — Ambulatory Visit
Admission: RE | Admit: 2018-01-12 | Discharge: 2018-01-12 | Disposition: A | Discharge status: Home / Self Care | Payer: Medicaid Other | Source: Ambulatory Visit | Attending: Radiation Oncology | Admitting: Radiation Oncology

## 2018-01-12 DIAGNOSIS — Z510 Encounter for antineoplastic radiation therapy: Secondary | ICD-10-CM | POA: Diagnosis not present

## 2018-01-12 DIAGNOSIS — C50212 Malignant neoplasm of upper-inner quadrant of left female breast: Secondary | ICD-10-CM | POA: Diagnosis not present

## 2018-01-15 ENCOUNTER — Ambulatory Visit
Admission: RE | Admit: 2018-01-15 | Discharge: 2018-01-15 | Disposition: A | Discharge status: Home / Self Care | Payer: Medicaid Other | Source: Ambulatory Visit | Attending: Radiation Oncology | Admitting: Radiation Oncology

## 2018-01-15 DIAGNOSIS — Z510 Encounter for antineoplastic radiation therapy: Secondary | ICD-10-CM | POA: Diagnosis not present

## 2018-01-15 DIAGNOSIS — C50212 Malignant neoplasm of upper-inner quadrant of left female breast: Secondary | ICD-10-CM | POA: Diagnosis not present

## 2018-01-16 ENCOUNTER — Ambulatory Visit
Admission: RE | Admit: 2018-01-16 | Discharge: 2018-01-16 | Disposition: A | Discharge status: Home / Self Care | Payer: Medicaid Other | Source: Ambulatory Visit | Attending: Radiation Oncology | Admitting: Radiation Oncology

## 2018-01-16 DIAGNOSIS — C50212 Malignant neoplasm of upper-inner quadrant of left female breast: Secondary | ICD-10-CM | POA: Diagnosis not present

## 2018-01-16 DIAGNOSIS — Z510 Encounter for antineoplastic radiation therapy: Secondary | ICD-10-CM | POA: Diagnosis not present

## 2018-01-18 ENCOUNTER — Ambulatory Visit
Admission: RE | Admit: 2018-01-18 | Discharge: 2018-01-18 | Disposition: A | Discharge status: Home / Self Care | Payer: Medicaid Other | Source: Ambulatory Visit | Attending: Radiation Oncology | Admitting: Radiation Oncology

## 2018-01-18 DIAGNOSIS — C50212 Malignant neoplasm of upper-inner quadrant of left female breast: Secondary | ICD-10-CM | POA: Diagnosis not present

## 2018-01-18 DIAGNOSIS — Z510 Encounter for antineoplastic radiation therapy: Secondary | ICD-10-CM | POA: Diagnosis not present

## 2018-01-19 ENCOUNTER — Ambulatory Visit
Admission: RE | Admit: 2018-01-19 | Discharge: 2018-01-19 | Disposition: A | Discharge status: Home / Self Care | Payer: Medicaid Other | Source: Ambulatory Visit | Attending: Radiation Oncology | Admitting: Radiation Oncology

## 2018-01-19 DIAGNOSIS — C50212 Malignant neoplasm of upper-inner quadrant of left female breast: Secondary | ICD-10-CM | POA: Diagnosis not present

## 2018-01-19 DIAGNOSIS — Z510 Encounter for antineoplastic radiation therapy: Secondary | ICD-10-CM | POA: Diagnosis not present

## 2018-01-22 ENCOUNTER — Ambulatory Visit
Admission: RE | Admit: 2018-01-22 | Discharge: 2018-01-22 | Disposition: A | Discharge status: Home / Self Care | Payer: Medicaid Other | Source: Ambulatory Visit | Attending: Radiation Oncology | Admitting: Radiation Oncology

## 2018-01-22 DIAGNOSIS — C50212 Malignant neoplasm of upper-inner quadrant of left female breast: Secondary | ICD-10-CM | POA: Diagnosis not present

## 2018-01-22 DIAGNOSIS — Z510 Encounter for antineoplastic radiation therapy: Secondary | ICD-10-CM | POA: Diagnosis not present

## 2018-01-23 ENCOUNTER — Ambulatory Visit
Admission: RE | Admit: 2018-01-23 | Discharge: 2018-01-23 | Disposition: A | Discharge status: Home / Self Care | Payer: Medicaid Other | Source: Ambulatory Visit | Attending: Radiation Oncology | Admitting: Radiation Oncology

## 2018-01-23 ENCOUNTER — Ambulatory Visit: Payer: Medicaid Other | Admitting: Radiation Oncology

## 2018-01-23 DIAGNOSIS — C50212 Malignant neoplasm of upper-inner quadrant of left female breast: Secondary | ICD-10-CM | POA: Diagnosis not present

## 2018-01-23 DIAGNOSIS — Z510 Encounter for antineoplastic radiation therapy: Secondary | ICD-10-CM | POA: Diagnosis not present

## 2018-01-25 ENCOUNTER — Telehealth: Payer: Self-pay

## 2018-01-25 ENCOUNTER — Ambulatory Visit
Admission: RE | Admit: 2018-01-25 | Discharge: 2018-01-25 | Disposition: A | Discharge status: Home / Self Care | Payer: Medicaid Other | Source: Ambulatory Visit | Attending: Radiation Oncology | Admitting: Radiation Oncology

## 2018-01-25 DIAGNOSIS — Z510 Encounter for antineoplastic radiation therapy: Secondary | ICD-10-CM | POA: Diagnosis not present

## 2018-01-25 DIAGNOSIS — C50212 Malignant neoplasm of upper-inner quadrant of left female breast: Secondary | ICD-10-CM | POA: Diagnosis not present

## 2018-01-25 DIAGNOSIS — Z170 Estrogen receptor positive status [ER+]: Secondary | ICD-10-CM | POA: Diagnosis not present

## 2018-01-25 NOTE — Telephone Encounter (Signed)
In house spanish interpreter presented c/o not sleeping well and no appetite times two days, patient has Ativan on her list of medications, instructed her to tell the patient she can take before bedtime and this should help her rest.  Also she was given a coupon for Ensure to supplement her diet.  Instructed her to drink plenty of liquids.    Will ask [name] if there is anything else she would recommend?

## 2018-01-26 ENCOUNTER — Ambulatory Visit
Admission: RE | Admit: 2018-01-26 | Discharge: 2018-01-26 | Disposition: A | Discharge status: Home / Self Care | Payer: Medicaid Other | Source: Ambulatory Visit | Attending: Radiation Oncology | Admitting: Radiation Oncology

## 2018-01-26 DIAGNOSIS — C50212 Malignant neoplasm of upper-inner quadrant of left female breast: Secondary | ICD-10-CM | POA: Diagnosis not present

## 2018-01-26 DIAGNOSIS — Z510 Encounter for antineoplastic radiation therapy: Secondary | ICD-10-CM | POA: Diagnosis not present

## 2018-01-26 DIAGNOSIS — Z170 Estrogen receptor positive status [ER+]: Secondary | ICD-10-CM | POA: Diagnosis not present

## 2018-01-28 NOTE — Telephone Encounter (Signed)
I agree with the plan. I will be happy to see her sooner if needed, likely the weekly of  1/13.  Thanks  [name] MD

## 2018-01-29 ENCOUNTER — Ambulatory Visit
Admission: RE | Admit: 2018-01-29 | Discharge: 2018-01-29 | Disposition: A | Discharge status: Home / Self Care | Payer: Medicaid Other | Source: Ambulatory Visit | Attending: Radiation Oncology | Admitting: Radiation Oncology

## 2018-01-29 ENCOUNTER — Encounter: Payer: Self-pay | Admitting: Nutrition

## 2018-01-29 DIAGNOSIS — Z170 Estrogen receptor positive status [ER+]: Secondary | ICD-10-CM | POA: Diagnosis not present

## 2018-01-29 DIAGNOSIS — Z510 Encounter for antineoplastic radiation therapy: Secondary | ICD-10-CM | POA: Diagnosis not present

## 2018-01-29 DIAGNOSIS — C50212 Malignant neoplasm of upper-inner quadrant of left female breast: Secondary | ICD-10-CM | POA: Diagnosis not present

## 2018-01-29 NOTE — Progress Notes (Signed)
Provided samples of oral nutrition supplements and coupons.

## 2018-01-30 ENCOUNTER — Ambulatory Visit: Payer: Medicaid Other | Admitting: Radiation Oncology

## 2018-01-30 ENCOUNTER — Ambulatory Visit
Admission: RE | Admit: 2018-01-30 | Discharge: 2018-01-30 | Disposition: A | Discharge status: Home / Self Care | Payer: Medicaid Other | Source: Ambulatory Visit | Attending: Radiation Oncology | Admitting: Radiation Oncology

## 2018-01-30 DIAGNOSIS — C50212 Malignant neoplasm of upper-inner quadrant of left female breast: Secondary | ICD-10-CM | POA: Diagnosis not present

## 2018-01-30 DIAGNOSIS — Z170 Estrogen receptor positive status [ER+]: Secondary | ICD-10-CM

## 2018-01-30 DIAGNOSIS — Z510 Encounter for antineoplastic radiation therapy: Secondary | ICD-10-CM | POA: Diagnosis not present

## 2018-01-30 NOTE — Progress Notes (Signed)
Simulation note The patient was brought to the treatment room for simulation for the patient's upcoming electron treatment. The patient was setup in the treatment position and the target region was delineated. The patient will receive treatment to the left  chest wall using an en face electron field. One customized block/complex treatment device has been constructed for this purpose, and this will be used on a daily basis during the patient's treatment. After appropriate set up was confirmed, skin markings were placed to allow accurate targeting of the treatment area during the patient's course of therapy.  ------------------------------------------------  -----------------------------------  Amos Micheals D. Lillyth Spong, PhD, MD   

## 2018-01-31 ENCOUNTER — Ambulatory Visit
Admission: RE | Admit: 2018-01-31 | Discharge: 2018-01-31 | Disposition: A | Discharge status: Home / Self Care | Payer: Medicaid Other | Source: Ambulatory Visit | Attending: Radiation Oncology | Admitting: Radiation Oncology

## 2018-01-31 DIAGNOSIS — C50212 Malignant neoplasm of upper-inner quadrant of left female breast: Secondary | ICD-10-CM | POA: Diagnosis not present

## 2018-01-31 DIAGNOSIS — Z170 Estrogen receptor positive status [ER+]: Secondary | ICD-10-CM | POA: Diagnosis not present

## 2018-01-31 DIAGNOSIS — Z510 Encounter for antineoplastic radiation therapy: Secondary | ICD-10-CM | POA: Diagnosis not present

## 2018-02-01 ENCOUNTER — Ambulatory Visit
Admission: RE | Admit: 2018-02-01 | Discharge: 2018-02-01 | Disposition: A | Discharge status: Home / Self Care | Payer: Medicaid Other | Source: Ambulatory Visit | Attending: Radiation Oncology | Admitting: Radiation Oncology

## 2018-02-01 DIAGNOSIS — Z510 Encounter for antineoplastic radiation therapy: Secondary | ICD-10-CM | POA: Diagnosis not present

## 2018-02-01 DIAGNOSIS — C50212 Malignant neoplasm of upper-inner quadrant of left female breast: Secondary | ICD-10-CM | POA: Diagnosis not present

## 2018-02-01 DIAGNOSIS — Z170 Estrogen receptor positive status [ER+]: Secondary | ICD-10-CM | POA: Diagnosis not present

## 2018-02-02 ENCOUNTER — Ambulatory Visit
Admission: RE | Admit: 2018-02-02 | Discharge: 2018-02-02 | Disposition: A | Discharge status: Home / Self Care | Payer: Medicaid Other | Source: Ambulatory Visit | Attending: Radiation Oncology | Admitting: Radiation Oncology

## 2018-02-02 DIAGNOSIS — C50212 Malignant neoplasm of upper-inner quadrant of left female breast: Secondary | ICD-10-CM | POA: Diagnosis not present

## 2018-02-02 DIAGNOSIS — Z170 Estrogen receptor positive status [ER+]: Secondary | ICD-10-CM | POA: Diagnosis not present

## 2018-02-02 DIAGNOSIS — Z510 Encounter for antineoplastic radiation therapy: Secondary | ICD-10-CM | POA: Diagnosis not present

## 2018-02-05 ENCOUNTER — Ambulatory Visit
Admission: RE | Admit: 2018-02-05 | Discharge: 2018-02-05 | Disposition: A | Discharge status: Home / Self Care | Payer: Medicaid Other | Source: Ambulatory Visit | Attending: Radiation Oncology | Admitting: Radiation Oncology

## 2018-02-05 DIAGNOSIS — Z510 Encounter for antineoplastic radiation therapy: Secondary | ICD-10-CM | POA: Diagnosis not present

## 2018-02-05 DIAGNOSIS — Z170 Estrogen receptor positive status [ER+]: Secondary | ICD-10-CM | POA: Diagnosis not present

## 2018-02-05 DIAGNOSIS — C50212 Malignant neoplasm of upper-inner quadrant of left female breast: Secondary | ICD-10-CM | POA: Diagnosis not present

## 2018-02-06 ENCOUNTER — Ambulatory Visit
Admission: RE | Admit: 2018-02-06 | Discharge: 2018-02-06 | Disposition: A | Discharge status: Home / Self Care | Payer: Medicaid Other | Source: Ambulatory Visit | Attending: Radiation Oncology | Admitting: Radiation Oncology

## 2018-02-06 ENCOUNTER — Ambulatory Visit: Payer: Medicaid Other | Admitting: Radiation Oncology

## 2018-02-06 DIAGNOSIS — C50212 Malignant neoplasm of upper-inner quadrant of left female breast: Secondary | ICD-10-CM | POA: Diagnosis not present

## 2018-02-06 DIAGNOSIS — Z510 Encounter for antineoplastic radiation therapy: Secondary | ICD-10-CM | POA: Diagnosis not present

## 2018-02-06 DIAGNOSIS — Z170 Estrogen receptor positive status [ER+]: Secondary | ICD-10-CM | POA: Diagnosis not present

## 2018-02-07 ENCOUNTER — Ambulatory Visit
Admission: RE | Admit: 2018-02-07 | Discharge: 2018-02-07 | Disposition: A | Discharge status: Home / Self Care | Payer: Medicaid Other | Source: Ambulatory Visit | Attending: Radiation Oncology | Admitting: Radiation Oncology

## 2018-02-07 DIAGNOSIS — C50212 Malignant neoplasm of upper-inner quadrant of left female breast: Secondary | ICD-10-CM | POA: Diagnosis not present

## 2018-02-07 DIAGNOSIS — Z170 Estrogen receptor positive status [ER+]: Secondary | ICD-10-CM | POA: Diagnosis not present

## 2018-02-07 DIAGNOSIS — Z510 Encounter for antineoplastic radiation therapy: Secondary | ICD-10-CM | POA: Diagnosis not present

## 2018-02-08 ENCOUNTER — Ambulatory Visit
Admission: RE | Admit: 2018-02-08 | Discharge: 2018-02-08 | Disposition: A | Discharge status: Home / Self Care | Payer: Medicaid Other | Source: Ambulatory Visit | Attending: Radiation Oncology | Admitting: Radiation Oncology

## 2018-02-08 DIAGNOSIS — Z170 Estrogen receptor positive status [ER+]: Secondary | ICD-10-CM | POA: Diagnosis not present

## 2018-02-08 DIAGNOSIS — C50212 Malignant neoplasm of upper-inner quadrant of left female breast: Secondary | ICD-10-CM | POA: Diagnosis not present

## 2018-02-08 DIAGNOSIS — Z510 Encounter for antineoplastic radiation therapy: Secondary | ICD-10-CM | POA: Diagnosis not present

## 2018-02-08 NOTE — Progress Notes (Signed)
.  Simulation verification  The patient was brought to the treatment machine and placed in the plan treatment position.  Clinical set up was verified to ensure that the target region is appropriately covered for the patient's upcoming electron boost treatment.  The targeted volume of tissue is appropriately covered by the radiation field.  Based on my personal review, I approve the simulation verification.  The patient's treatment will proceed as planned.  ------------------------------------------------  -----------------------------------  Amanda Duarte D. Gerald Honea, PhD, MD  

## 2018-02-09 ENCOUNTER — Ambulatory Visit
Admission: RE | Admit: 2018-02-09 | Discharge: 2018-02-09 | Disposition: A | Discharge status: Home / Self Care | Payer: Medicaid Other | Source: Ambulatory Visit | Attending: Radiation Oncology | Admitting: Radiation Oncology

## 2018-02-09 DIAGNOSIS — Z170 Estrogen receptor positive status [ER+]: Secondary | ICD-10-CM | POA: Diagnosis not present

## 2018-02-09 DIAGNOSIS — C50212 Malignant neoplasm of upper-inner quadrant of left female breast: Secondary | ICD-10-CM | POA: Diagnosis not present

## 2018-02-09 DIAGNOSIS — Z510 Encounter for antineoplastic radiation therapy: Secondary | ICD-10-CM | POA: Diagnosis not present

## 2018-02-12 ENCOUNTER — Ambulatory Visit
Admission: RE | Admit: 2018-02-12 | Discharge: 2018-02-12 | Disposition: A | Discharge status: Home / Self Care | Payer: Medicaid Other | Source: Ambulatory Visit | Attending: Radiation Oncology | Admitting: Radiation Oncology

## 2018-02-12 DIAGNOSIS — Z170 Estrogen receptor positive status [ER+]: Secondary | ICD-10-CM | POA: Diagnosis not present

## 2018-02-12 DIAGNOSIS — C50212 Malignant neoplasm of upper-inner quadrant of left female breast: Secondary | ICD-10-CM | POA: Diagnosis not present

## 2018-02-12 DIAGNOSIS — Z510 Encounter for antineoplastic radiation therapy: Secondary | ICD-10-CM | POA: Diagnosis not present

## 2018-02-13 ENCOUNTER — Ambulatory Visit
Admission: RE | Admit: 2018-02-13 | Discharge: 2018-02-13 | Disposition: A | Discharge status: Home / Self Care | Payer: Medicaid Other | Source: Ambulatory Visit | Attending: Radiation Oncology | Admitting: Radiation Oncology

## 2018-02-13 DIAGNOSIS — Z510 Encounter for antineoplastic radiation therapy: Secondary | ICD-10-CM | POA: Diagnosis not present

## 2018-02-13 DIAGNOSIS — C50212 Malignant neoplasm of upper-inner quadrant of left female breast: Secondary | ICD-10-CM | POA: Diagnosis not present

## 2018-02-13 DIAGNOSIS — Z170 Estrogen receptor positive status [ER+]: Secondary | ICD-10-CM | POA: Diagnosis not present

## 2018-02-14 ENCOUNTER — Ambulatory Visit
Admission: RE | Admit: 2018-02-14 | Discharge: 2018-02-14 | Disposition: A | Discharge status: Home / Self Care | Payer: Medicaid Other | Source: Ambulatory Visit | Attending: Radiation Oncology | Admitting: Radiation Oncology

## 2018-02-14 DIAGNOSIS — Z510 Encounter for antineoplastic radiation therapy: Secondary | ICD-10-CM | POA: Diagnosis not present

## 2018-02-14 DIAGNOSIS — Z170 Estrogen receptor positive status [ER+]: Secondary | ICD-10-CM | POA: Diagnosis not present

## 2018-02-14 DIAGNOSIS — C50212 Malignant neoplasm of upper-inner quadrant of left female breast: Secondary | ICD-10-CM | POA: Diagnosis not present
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
## 2018-02-16 ENCOUNTER — Inpatient Hospital Stay (HOSPITAL_BASED_OUTPATIENT_CLINIC_OR_DEPARTMENT_OTHER): Payer: Medicaid Other | Admitting: Hematology

## 2018-02-16 ENCOUNTER — Telehealth: Payer: Self-pay | Admitting: Hematology

## 2018-02-16 ENCOUNTER — Encounter: Payer: Self-pay | Admitting: Hematology

## 2018-02-16 ENCOUNTER — Inpatient Hospital Stay: Payer: Medicaid Other | Attending: Nurse Practitioner

## 2018-02-16 VITALS — BP 133/60 | HR 72 | Temp 98.5°F | Resp 18 | Ht 59.0 in | Wt 127.0 lb

## 2018-02-16 DIAGNOSIS — Z923 Personal history of irradiation: Secondary | ICD-10-CM

## 2018-02-16 DIAGNOSIS — E2839 Other primary ovarian failure: Secondary | ICD-10-CM

## 2018-02-16 DIAGNOSIS — I10 Essential (primary) hypertension: Secondary | ICD-10-CM | POA: Insufficient documentation

## 2018-02-16 DIAGNOSIS — C50212 Malignant neoplasm of upper-inner quadrant of left female breast: Secondary | ICD-10-CM

## 2018-02-16 DIAGNOSIS — Z9221 Personal history of antineoplastic chemotherapy: Secondary | ICD-10-CM | POA: Insufficient documentation

## 2018-02-16 DIAGNOSIS — Z79899 Other long term (current) drug therapy: Secondary | ICD-10-CM

## 2018-02-16 DIAGNOSIS — E1165 Type 2 diabetes mellitus with hyperglycemia: Secondary | ICD-10-CM | POA: Diagnosis not present

## 2018-02-16 DIAGNOSIS — Z9012 Acquired absence of left breast and nipple: Secondary | ICD-10-CM | POA: Insufficient documentation

## 2018-02-16 DIAGNOSIS — Z7984 Long term (current) use of oral hypoglycemic drugs: Secondary | ICD-10-CM | POA: Diagnosis not present

## 2018-02-16 DIAGNOSIS — Z171 Estrogen receptor negative status [ER-]: Secondary | ICD-10-CM | POA: Insufficient documentation

## 2018-02-16 DIAGNOSIS — G629 Polyneuropathy, unspecified: Secondary | ICD-10-CM | POA: Diagnosis not present

## 2018-02-16 DIAGNOSIS — Z170 Estrogen receptor positive status [ER+]: Secondary | ICD-10-CM

## 2018-02-16 DIAGNOSIS — E119 Type 2 diabetes mellitus without complications: Secondary | ICD-10-CM

## 2018-02-16 LAB — CBC WITH DIFFERENTIAL (CANCER CENTER ONLY)
ABS IMMATURE GRANULOCYTES: 0.02 10*3/uL (ref 0.00–0.07)
BASOS ABS: 0 10*3/uL (ref 0.0–0.1)
Basophils Relative: 1 %
EOS ABS: 2.1 10*3/uL — AB (ref 0.0–0.5)
Eosinophils Relative: 30 %
HEMATOCRIT: 39.9 % (ref 36.0–46.0)
HEMOGLOBIN: 13.3 g/dL (ref 12.0–15.0)
Immature Granulocytes: 0 %
Lymphocytes Relative: 18 %
Lymphs Abs: 1.2 10*3/uL (ref 0.7–4.0)
MCH: 32.5 pg (ref 26.0–34.0)
MCHC: 33.3 g/dL (ref 30.0–36.0)
MCV: 97.6 fL (ref 80.0–100.0)
MONO ABS: 0.7 10*3/uL (ref 0.1–1.0)
MONOS PCT: 10 %
NEUTROS PCT: 41 %
Neutro Abs: 2.9 10*3/uL (ref 1.7–7.7)
Platelet Count: 160 10*3/uL (ref 150–400)
RBC: 4.09 MIL/uL (ref 3.87–5.11)
RDW: 13.5 % (ref 11.5–15.5)
WBC: 7 10*3/uL (ref 4.0–10.5)
nRBC: 0 % (ref 0.0–0.2)

## 2018-02-16 LAB — CMP (CANCER CENTER ONLY)
ALBUMIN: 3.8 g/dL (ref 3.5–5.0)
ALK PHOS: 98 U/L (ref 38–126)
ALT: 41 U/L (ref 0–44)
ANION GAP: 10 (ref 5–15)
AST: 68 U/L — ABNORMAL HIGH (ref 15–41)
BILIRUBIN TOTAL: 0.4 mg/dL (ref 0.3–1.2)
BUN: 7 mg/dL — ABNORMAL LOW (ref 8–23)
CALCIUM: 10 mg/dL (ref 8.9–10.3)
CO2: 28 mmol/L (ref 22–32)
Chloride: 99 mmol/L (ref 98–111)
Creatinine: 0.72 mg/dL (ref 0.44–1.00)
GFR, Est AFR Am: >60 mL/min (ref >60)
GLUCOSE: 234 mg/dL — AB (ref 70–99)
Potassium: 4.2 mmol/L (ref 3.5–5.1)
Sodium: 137 mmol/L (ref 135–145)
TOTAL PROTEIN: 8.4 g/dL — AB (ref 6.5–8.1)

## 2018-02-16 NOTE — Telephone Encounter (Signed)
Scheduled appt per 01/24 los. ° °Printed calendar and avs. °

## 2018-02-19 ENCOUNTER — Encounter: Payer: Self-pay | Admitting: Radiation Oncology

## 2018-02-19 NOTE — Progress Notes (Signed)
  Radiation Oncology         [phone] ________________________________  Name: Amanda Duarte MRN: [id number]  Date: 02/19/2018  DOB: [birth year]  End of Treatment Note  Diagnosis:   Triple negative left breast cancer     Indication for treatment:  Curative       Radiation treatment dates:   01/02/18-02/14/18  Site/dose:   1. Left chest wall; 50 Gy in 25 fractions of 2 Gy           2. Left chest wall scf/ax; 45 Gy in 25 fractions of 1.8 Gy           3. Left chest wall boost (mastectomy scar); 10 Gy in 5 fractions of 2 Gy  Beams/energy:   1. Photon 3D; 6X, 10X        2. Photon 3D; 10X  Narrative: The patient tolerated radiation treatment relatively well.     At the beginning of treatment, pt reported mild fatigue and pain in her right shoulder (opposite to treatment site). She also had pain in her left chest wall. Towards the end of treatment, pt reported poor appetite and severe fatigue. Pt developed hyperpigmentation and was prescribed Radiaplex, which she endorsed using 2x a day. She developed swelling in the area superior to her mastectomy scar as well as the chest wall. She developed radiation dermatitis, but no moist desquamation  Plan: The patient has completed radiation treatment. The patient will return to radiation oncology clinic for routine followup in one month. I advised them to call or return sooner if they have any questions or concerns related to their recovery or treatment.  -----------------------------------  Blair Promise, PhD, MD  This document serves as a record of services personally performed by Gery Pray, MD. It was created on his behalf by Mary-Margaret Loma Messing, a trained medical scribe. The creation of this record is based on the scribe's personal observations and the provider's statements to them. This document has been checked and approved by the attending provider.

## 2018-03-26 ENCOUNTER — Other Ambulatory Visit: Payer: Self-pay

## 2018-03-26 ENCOUNTER — Ambulatory Visit
Admission: RE | Admit: 2018-03-26 | Discharge: 2018-03-26 | Disposition: A | Discharge status: Home / Self Care | Payer: Medicaid Other | Source: Ambulatory Visit | Attending: Radiation Oncology | Admitting: Radiation Oncology

## 2018-03-26 ENCOUNTER — Encounter: Payer: Self-pay | Admitting: Radiation Oncology

## 2018-03-26 VITALS — BP 137/61 | HR 66 | Temp 98.2°F | Resp 20 | Ht 59.0 in | Wt 126.6 lb

## 2018-03-26 DIAGNOSIS — Z79899 Other long term (current) drug therapy: Secondary | ICD-10-CM | POA: Diagnosis not present

## 2018-03-26 DIAGNOSIS — Z9012 Acquired absence of left breast and nipple: Secondary | ICD-10-CM | POA: Diagnosis not present

## 2018-03-26 DIAGNOSIS — Z170 Estrogen receptor positive status [ER+]: Secondary | ICD-10-CM

## 2018-03-26 DIAGNOSIS — Z171 Estrogen receptor negative status [ER-]: Secondary | ICD-10-CM | POA: Insufficient documentation

## 2018-03-26 DIAGNOSIS — Z7984 Long term (current) use of oral hypoglycemic drugs: Secondary | ICD-10-CM | POA: Diagnosis not present

## 2018-03-26 DIAGNOSIS — C50212 Malignant neoplasm of upper-inner quadrant of left female breast: Secondary | ICD-10-CM | POA: Insufficient documentation

## 2018-03-26 DIAGNOSIS — Z923 Personal history of irradiation: Secondary | ICD-10-CM | POA: Insufficient documentation

## 2018-03-26 NOTE — Progress Notes (Signed)
Pt presents today for one month f/u with Dr. [name] Come. Pt reports fatigue is improving. Pt reports neuropathy in hands/fingers and feet/toes. Pt also reports continuing pain in middle of upper back. Pt also reports occasional incisional pain. Pt is continuing to use skin care cream provided by rad onc. Mastectomy site is hyperpigmented and flaking. Skin intact. Hospital interpreter, Almyra Free, accompanies pt.   BP 137/61 (BP Location: Left Arm, Patient Position: Sitting)   Pulse 66   Temp 98.2 F (36.8 C) (Oral)   Resp 20   Ht 4\' 11"  (1.499 m)   Wt 126 lb 9.6 oz (57.4 kg)   SpO2 98%   BMI 25.57 kg/m   Wt Readings from Last 3 Encounters:  03/26/18 126 lb 9.6 oz (57.4 kg)  02/16/18 127 lb (57.6 kg)  12/26/17 127 lb (57.6 kg)   Loma Sousa, RN BSN

## 2018-03-26 NOTE — Progress Notes (Signed)
Radiation Oncology         [phone] ________________________________  Name: Amanda Duarte MRN: [id number]  Date: 03/26/2018  DOB: 1955/07/23  Follow-Up Visit Note  CC: Amanda Pounds, NP  Amanda Merle, MD    ICD-10-CM   1. 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 Left chest wall; 50 Gy in 25 fractions of 2 Gy                      2. Left chest wall scf/ax; 45 Gy in 25 fractions of 1.8 Gy                      3. Left chest wall boost (mastectomy scar); 10 Gy in 5 fractions of 2 Gy  Narrative:  The patient returns today for routine follow-up.  she is doing well overall. She is accompanied by a Patent attorney.   On review of systems, she reports occasional pain along her chest, not requiring pain medication. she denies left arm swelling and any other symptoms. Pertinent positives are listed and detailed within the above HPI.                 ALLERGIES:  has No Known Allergies.  Meds: Current Outpatient Medications  Medication Sig Dispense Refill  . atorvastatin (LIPITOR) 20 MG tablet Take 1 tablet (20 mg total) by mouth daily. 90 tablet 3  . Blood Glucose Monitoring Suppl (ONE TOUCH ULTRA 2) w/Device KIT Please check blood sugar by fingerstick once in the morning before you eat and once in the evening 1 hour after you eat. 1 each 0  . cyclobenzaprine (FLEXERIL) 5 MG tablet Take 1 tablet (5 mg total) by mouth 3 (three) times daily as needed for muscle spasms. 30 tablet 1  . glimepiride (AMARYL) 4 MG tablet Take 1 tablet (4 mg total) by mouth daily before breakfast. 90 tablet 2  . glucose blood test strip Use as instructed 100 each 12  . hydrochlorothiazide (HYDRODIURIL) 25 MG tablet Take 1 tablet (25 mg total) by mouth  daily. 90 tablet 3  . HYDROcodone-acetaminophen (NORCO) 5-325 MG tablet Take 1-2 tablets by mouth every 6 (six) hours as needed for moderate pain or severe pain. 30 tablet 0  . lidocaine-prilocaine (EMLA) cream Apply to affected area once 30 g 3  . LORazepam (ATIVAN) 0.5 MG tablet Take 1 tablet (0.5 mg total) by mouth every 6 (six) hours as needed (Nausea or vomiting). 30 tablet 0  . losartan (COZAAR) 50 MG tablet Take 1 tablet (50 mg total) by mouth daily. 90 tablet 3  . omega-3 acid ethyl esters (LOVAZA) 1 g capsule Take 1 capsule (1 g total) by mouth 2 (two) times daily. 60 capsule 3  . ondansetron (ZOFRAN) 8 MG tablet Take 1 tablet (8 mg total) by mouth 2 (two) times daily as needed. Start on the third day after chemotherapy. 30 tablet 1  . potassium chloride SA (K-DUR,KLOR-CON) 20 MEQ tablet Take 1 tablet (20 mEq total) by mouth daily. 20 tablet 0  . prochlorperazine (COMPAZINE) 10 MG tablet Take 1 tablet (10 mg total) by mouth every 6 (six) hours as needed (Nausea or vomiting). [place]  tablet 1  . senna (SENOKOT) 8.6 MG TABS tablet Take 1 tablet (8.6 mg total) by mouth daily as needed for mild constipation. 10 each 0  . sitaGLIPtin-metformin (JANUMET) 50-1000 MG tablet Take 1 tablet by mouth 2 (two) times daily with a meal. 180 tablet 2  . traMADol (ULTRAM) 50 MG tablet Take 1 tablet (50 mg total) by mouth every 6 (six) hours as needed for moderate pain or severe pain. 20 tablet 0   No current facility-administered medications for this encounter.     Physical Findings: The patient is in no acute distress. Patient is alert and oriented.  height is 4' 11"  (1.499 m) and weight is 126 lb 9.6 oz (57.4 kg). Her oral temperature is 98.2 F (36.8 C). Her blood pressure is 137/61 and her pulse is 66. Her respiration is 20 and oxygen saturation is 98%. .  No significant changes. Lungs are clear to auscultation bilaterally. Heart has regular rate and rhythm. No palpable cervical, supraclavicular, or  axillary adenopathy. Abdomen soft, non-tender, normal bowel sounds. Right breast with no palpable mass, nipple discharge, or bleeding. Left chest wall patient's skin has healed well. Some residual hyperpigmentation changes. Mild swelling along the soft tissues of the left lateral chest wall area.     Lab Findings: Lab Results  Component Value Date   WBC 7.0 02/16/2018   HGB 13.3 02/16/2018   HCT 39.9 02/16/2018   MCV 97.6 02/16/2018   PLT 160 02/16/2018    Radiographic Findings: No results found.  Impression:  No evidence of recurrence on clinical exam. Pt's skin has healed well.  Plan:  PRN follow-up in radiation oncology. She will continue close follow-up with [name] in medical oncology.  ____________________________________   Blair Promise, PhD, MD    This document serves as a record of services personally performed by Gery Pray, MD. It was created on his behalf by Mary-Margaret Loma Messing, a trained medical scribe. The creation of this record is based on the scribe's personal observations and the provider's statements to them. This document has been checked and approved by the attending provider.

## 2018-03-27 ENCOUNTER — Encounter: Payer: Self-pay | Admitting: Nurse Practitioner

## 2018-03-27 ENCOUNTER — Ambulatory Visit: Payer: Medicaid Other | Attending: Nurse Practitioner | Admitting: Nurse Practitioner

## 2018-03-27 VITALS — BP 139/74 | HR 74 | Temp 99.3°F | Ht 59.0 in | Wt 125.6 lb

## 2018-03-27 DIAGNOSIS — E781 Pure hyperglyceridemia: Secondary | ICD-10-CM

## 2018-03-27 DIAGNOSIS — E785 Hyperlipidemia, unspecified: Secondary | ICD-10-CM | POA: Diagnosis not present

## 2018-03-27 DIAGNOSIS — Z853 Personal history of malignant neoplasm of breast: Secondary | ICD-10-CM | POA: Diagnosis not present

## 2018-03-27 DIAGNOSIS — Z79899 Other long term (current) drug therapy: Secondary | ICD-10-CM | POA: Insufficient documentation

## 2018-03-27 DIAGNOSIS — I10 Essential (primary) hypertension: Secondary | ICD-10-CM

## 2018-03-27 DIAGNOSIS — E118 Type 2 diabetes mellitus with unspecified complications: Secondary | ICD-10-CM

## 2018-03-27 DIAGNOSIS — Z833 Family history of diabetes mellitus: Secondary | ICD-10-CM | POA: Diagnosis not present

## 2018-03-27 DIAGNOSIS — E1165 Type 2 diabetes mellitus with hyperglycemia: Secondary | ICD-10-CM

## 2018-03-27 DIAGNOSIS — E782 Mixed hyperlipidemia: Secondary | ICD-10-CM | POA: Diagnosis not present

## 2018-03-27 DIAGNOSIS — Z7984 Long term (current) use of oral hypoglycemic drugs: Secondary | ICD-10-CM | POA: Diagnosis not present

## 2018-03-27 LAB — POCT GLYCOSYLATED HEMOGLOBIN (HGB A1C): Hemoglobin A1C: 11 % — AB (ref 4.0–5.6)

## 2018-03-27 LAB — GLUCOSE, POCT (MANUAL RESULT ENTRY): POC GLUCOSE: 271 mg/dL — AB (ref 70–99)

## 2018-03-27 MED ORDER — OMEGA-3-ACID ETHYL ESTERS 1 G PO CAPS: 1.0000 g | ORAL_CAPSULE | Freq: Two times a day (BID) | ORAL | 3 refills | Status: DC

## 2018-03-27 MED ORDER — GLIMEPIRIDE 4 MG PO TABS: 8.0000 mg | ORAL_TABLET | Freq: Every day | ORAL | 0 refills | Status: DC

## 2018-03-27 MED ORDER — HYDROCHLOROTHIAZIDE 25 MG PO TABS: 25.0000 mg | ORAL_TABLET | Freq: Every day | ORAL | 3 refills | Status: DC

## 2018-03-27 MED ORDER — INSULIN PEN NEEDLE 31G X 5 MM MISC: 1 refills | Status: DC

## 2018-03-27 MED ORDER — POTASSIUM CHLORIDE CRYS ER 20 MEQ PO TBCR: 20.0000 meq | EXTENDED_RELEASE_TABLET | Freq: Every day | ORAL | 0 refills | Status: DC

## 2018-03-27 MED ORDER — ACCU-CHEK FASTCLIX LANCETS MISC: 12 refills | Status: DC

## 2018-03-27 MED ORDER — GLUCOSE BLOOD VI STRP: ORAL_STRIP | 12 refills | Status: DC

## 2018-03-27 MED ORDER — LOSARTAN POTASSIUM 50 MG PO TABS: 50.0000 mg | ORAL_TABLET | Freq: Every day | ORAL | 3 refills | Status: DC

## 2018-03-27 MED ORDER — ONETOUCH ULTRA 2 W/DEVICE KIT: PACK | 0 refills | Status: DC

## 2018-03-27 MED ORDER — ATORVASTATIN CALCIUM 20 MG PO TABS: 20.0000 mg | ORAL_TABLET | Freq: Every day | ORAL | 3 refills | Status: DC

## 2018-03-27 MED ORDER — LIRAGLUTIDE 18 MG/3ML ~~LOC~~ SOPN: PEN_INJECTOR | SUBCUTANEOUS | 3 refills | Status: DC

## 2018-03-27 NOTE — Progress Notes (Signed)
Assessment & Plan:  Amanda Duarte was seen today for follow-up.  Diagnoses and all orders for this visit:  Type 2 diabetes mellitus with complication, without long-term current use of insulin (HCC) -     Glucose (CBG) -     HgB A1c -     Ambulatory referral to Ophthalmology -     Lipid panel -     Microalbumin/Creatinine Ratio, Urine -     liraglutide (VICTOZA) 18 MG/3ML SOPN; SubQ:  Inject 0.6 mg once daily into the skin. Week 2: increase to 1.2 mg once daily; week 3: increase to 1.8 mg once daily -     Blood Glucose Monitoring Suppl (ONE TOUCH ULTRA 2) w/Device KIT; Please check blood sugar by fingerstick once in the morning before you eat and once in the evening 1 hour after you eat. -     glucose blood test strip; Use as instructed -     Insulin Pen Needle (B-D UF III MINI PEN NEEDLES) 31G X 5 MM MISC; Use as instructed. Check blood glucose levels twice per day. -     glimepiride (AMARYL) 4 MG tablet; Take 2 tablets (8 mg total) by mouth daily before breakfast. -     ACCU-CHEK FASTCLIX LANCETS MISC; Use as instructed. Diabetes is poorly controlled. Advised patient to keep a fasting blood sugar log fast, 2 hours post lunch and bedtime which will be reviewed at the next office visit.  Continue blood sugar control as discussed in office today, low carbohydrate diet, and regular physical exercise as tolerated, 150 minutes per week (30 min each day, 5 days per week, or 50 min 3 days per week). Keep blood sugar logs with fasting goal of 90-130 mg/dl, post prandial (after you eat) less than 180.  For Hypoglycemia: BS <60 and Hyperglycemia BS >400; contact the clinic ASAP. Annual eye exams and foot exams are recommended.  Essential hypertension -     hydrochlorothiazide (HYDRODIURIL) 25 MG tablet; Take 1 tablet (25 mg total) by mouth daily. -     losartan (COZAAR) 50 MG tablet; Take 1 tablet (50 mg total) by mouth daily. -     potassium chloride SA (K-DUR,KLOR-CON) 20 MEQ tablet; Take 1 tablet (20  mEq total) by mouth daily. Continue all antihypertensives as prescribed.  Remember to bring in your blood pressure log with you for your follow up appointment.  DASH/Mediterranean Diets are healthier choices for HTN.    Pure hypertriglyceridemia -     atorvastatin (LIPITOR) 20 MG tablet; Take 1 tablet (20 mg total) by mouth daily. -     omega-3 acid ethyl esters (LOVAZA) 1 g capsule; Take 1 capsule (1 g total) by mouth 2 (two) times daily for 30 days. INSTRUCTIONS: Work on a low fat, heart healthy diet and participate in regular aerobic exercise program by working out at least 150 minutes per week; 5 days a week-30 minutes per day. Avoid red meat, fried foods. junk foods, sodas, sugary drinks, unhealthy snacking, alcohol and smoking.  Drink at least 48oz of water per day and monitor your carbohydrate intake daily.     Patient has been counseled on age-appropriate routine health concerns for screening and prevention. These are reviewed and up-to-date. Referrals have been placed accordingly. Immunizations are up-to-date or declined.    Subjective:   Chief Complaint  Patient presents with  . Follow-up    Patient is here for follow-up on diabetes and HTN.    HPI Amanda Duarte 63 y.o. female presents  to office today for follow up to DM, HTN and HPL. She has a recent history of malignant neoplasm of the left breast. VRI was used to communicate directly with patient for the entire encounter including providing detailed patient instructions.    Hypertension She is not exercising and is adherent to low salt diet.  She does not have a blood pressure log  today. Blood pressure is well controlled at home as well as today. She denies chest pain, shortness of breath, palpitations, lightheadedness, dizziness, headaches or BLE edema. Endorses medication compliance taking HCTZ 28m and losartan 50 mg daily.  BP Readings from Last 3 Encounters:  03/27/18 139/74  03/26/18 137/61  02/16/18 133/60     Hyperlipidemia Patient presents for follow up to hyperlipidemia.  She is medication compliant taking atorvastatin 247mand Lovaza 1 gm BID. She is diet compliant and denies chest pain or statin intolerance including myalgias. LDL not at goal. Lab Results  Component Value Date   CHOL 198 09/26/2017   Lab Results  Component Value Date   HDL 40 09/26/2017   Lab Results  Component Value Date   LDLCALC Comment 09/26/2017   Lab Results  Component Value Date   TRIG 479 (H) 09/26/2017   Lab Results  Component Value Date   CHOLHDL 5.0 (H) 09/26/2017      Diabetes Mellitus Type II Current symptoms/problems include paresthesia of the feet and and hands and have been stable.  Known diabetic complications: peripheral neuropathy Current diabetic medications include: amaryl 39m77maily, janumet 50-1000 mg BID. WILL INCREASE GLIMEPIRIDE AND ADD [place]  Eye exam current (within one year): no Weight trend: stable Prior visit with dietician: no Current monitoring regimen: office lab tests - 4 times monthly Home blood sugar records: trend: fluctuating a bit Any episodes of hypoglycemia? no Is She on ACE inhibitor or angiotensin II receptor blocker?  Yes  Lab Results  Component Value Date   HGBA1C 11.0 (A) 03/27/2018   HGBA1C 6.2 (A) 09/26/2017   HGBA1C 9.2 (A) 06/26/2017         Review of Systems  Constitutional: Negative for fever, malaise/fatigue and weight loss.  HENT: Negative.  Negative for nosebleeds.   Eyes: Negative.  Negative for blurred vision, double vision and photophobia.  Respiratory: Negative.  Negative for cough and shortness of breath.   Cardiovascular: Negative.  Negative for chest pain, palpitations and leg swelling.  Gastrointestinal: Negative.  Negative for heartburn, nausea and vomiting.  Musculoskeletal: Positive for back pain. Negative for myalgias.  Neurological: Positive for tingling and sensory change. Negative for dizziness, focal weakness,  seizures and headaches.  Psychiatric/Behavioral: Negative.  Negative for suicidal ideas.    Past Medical History:  Diagnosis Date  . [place]   left breast cancer  . [place] mellitus without complication ([facility] . Hyperlipidemia   . Hypertension   . Tibial plateau fracture, left    04-13-17 had ORIF    Past Surgical History:  Procedure Laterality Date  . MASTECTOMY MODIFIED RADICAL Left 11/20/2017   Procedure: LEFT MODIFIED RADICAL MASTECTOMY;  Surgeon: IngFanny SkatesD;  [facility] [place]  Laterality: Left;  . ORIF TIBIA PLATEAU Left 04/13/2017   Procedure: OPEN REDUCTION INTERNAL FIXATION (ORIF) TIBIAL PLATEAU;  Surgeon: HadShona NeedlesD;  [facility] [place] [place];  Laterality: Left;  . PORT-A-CATH REMOVAL Right 11/20/2017   Procedure: REMOVAL PORT-A-CATH;  Surgeon: IngFanny SkatesD;  [facility] [place] [place];  Laterality:  Right;  [name] PORTACATH PLACEMENT Right 05/31/2017   Procedure: INSERTION PORT-A-CATH;  Surgeon: Fanny Skates, MD;  Location: [place];  Service: General;  Laterality: Right;    Family History  Problem Relation Age of Onset  . Diabetes Son   . Breast cancer Neg Hx     Social History Reviewed with no changes to be made today.   Outpatient Medications Prior to Visit  Medication Sig Dispense Refill  . lidocaine-prilocaine (EMLA) cream Apply to affected area once 30 g 3  . prochlorperazine (COMPAZINE) 10 MG tablet Take 1 tablet (10 mg total) by mouth every 6 (six) hours as needed (Nausea or vomiting). 30 tablet 1  . senna (SENOKOT) 8.6 MG TABS tablet Take 1 tablet (8.6 mg total) by mouth daily as needed for mild constipation. 10 each 0  . atorvastatin (LIPITOR) 20 MG tablet Take 1 tablet (20 mg total) by mouth daily. 90 tablet 3  . Blood Glucose Monitoring Suppl (ONE TOUCH ULTRA 2) w/Device KIT Please check blood sugar by fingerstick once in the morning before you eat and once in the  evening 1 hour after you eat. 1 each 0  . glucose blood test strip Use as instructed 100 each 12  . hydrochlorothiazide (HYDRODIURIL) 25 MG tablet Take 1 tablet (25 mg total) by mouth daily. 90 tablet 3  . losartan (COZAAR) 50 MG tablet Take 1 tablet (50 mg total) by mouth daily. 90 tablet 3  . potassium chloride SA (K-DUR,KLOR-CON) 20 MEQ tablet Take 1 tablet (20 mEq total) by mouth daily. 20 tablet 0  . sitaGLIPtin-metformin (JANUMET) 50-1000 MG tablet Take 1 tablet by mouth 2 (two) times daily with a meal. 180 tablet 2  . cyclobenzaprine (FLEXERIL) 5 MG tablet Take 1 tablet (5 mg total) by mouth 3 (three) times daily as needed for muscle spasms. (Patient not taking: Reported on 03/27/2018) 30 tablet 1  . glimepiride (AMARYL) 4 MG tablet Take 1 tablet (4 mg total) by mouth daily before breakfast. 90 tablet 2  . HYDROcodone-acetaminophen (NORCO) 5-325 MG tablet Take 1-2 tablets by mouth every 6 (six) hours as needed for moderate pain or severe pain. (Patient not taking: Reported on 03/27/2018) 30 tablet 0  . LORazepam (ATIVAN) 0.5 MG tablet Take 1 tablet (0.5 mg total) by mouth every 6 (six) hours as needed (Nausea or vomiting). (Patient not taking: Reported on 03/27/2018) 30 tablet 0  . omega-3 acid ethyl esters (LOVAZA) 1 g capsule Take 1 capsule (1 g total) by mouth 2 (two) times daily. 60 capsule 3  . ondansetron (ZOFRAN) 8 MG tablet Take 1 tablet (8 mg total) by mouth 2 (two) times daily as needed. Start on the third day after chemotherapy. (Patient not taking: Reported on 03/27/2018) 30 tablet 1  . traMADol (ULTRAM) 50 MG tablet Take 1 tablet (50 mg total) by mouth every 6 (six) hours as needed for moderate pain or severe pain. (Patient not taking: Reported on 03/27/2018) 20 tablet 0   No facility-administered medications prior to visit.     No Known Allergies     Objective:    BP 139/74 (BP Location: Left Arm, Patient Position: Sitting, Cuff Size: Normal)   Pulse 74   Temp 99.3 F (37.4 C)  (Oral)   Ht 4' 11"  (1.499 m)   Wt 125 lb 9.6 oz (57 kg)   SpO2 95%   BMI 25.37 kg/m  Wt Readings from Last 3 Encounters:  03/27/18 125 lb 9.6 oz (57 kg)  03/26/18 126 lb 9.6 oz (57.4 kg)  02/16/18 127 lb (57.6 kg)    Physical Exam Vitals signs and nursing note reviewed.  Constitutional:      Appearance: She is well-developed.  HENT:     Head: Normocephalic and atraumatic.  Neck:     Musculoskeletal: Normal range of motion.  Cardiovascular:     Rate and Rhythm: Normal rate and regular rhythm.     Pulses:          Dorsalis pedis pulses are 2+ on the right side and 2+ on the left side.       Posterior tibial pulses are 2+ on the right side and 2+ on the left side.     Heart sounds: Normal heart sounds. No murmur. No friction rub. No gallop.   Pulmonary:     Effort: Pulmonary effort is normal. No tachypnea or respiratory distress.     Breath sounds: Normal breath sounds. No decreased breath sounds, wheezing, rhonchi or rales.  Chest:     Chest wall: No tenderness.  Abdominal:     General: Bowel sounds are normal.     Palpations: Abdomen is soft.  Musculoskeletal: Normal range of motion.  Feet:     Right foot:     Protective Sensation: 10 sites tested. 10 sites sensed.     Skin integrity: Callus present. No skin breakdown.     Left foot:     Protective Sensation: 10 sites tested. 10 sites sensed.     Skin integrity: Callus present. No skin breakdown.  Skin:    General: Skin is warm and dry.  Neurological:     Mental Status: She is alert and oriented to person, place, and time.     Coordination: Coordination normal.  Psychiatric:        Behavior: Behavior normal. Behavior is cooperative.        Thought Content: Thought content normal.        Judgment: Judgment normal.          Patient has been counseled extensively about nutrition and exercise as well as the importance of adherence with medications and regular follow-up. The patient was given clear instructions to  go to ER or return to medical center if symptoms don't improve, worsen or new problems develop. The patient verbalized understanding.   Follow-up: Return for 4 weeks with luke bring meter and meds.[name], FNP-BC [facility], [place]   03/27/2018, 3:36 PM

## 2018-03-28 ENCOUNTER — Other Ambulatory Visit: Payer: Self-pay | Admitting: Nurse Practitioner

## 2018-03-28 ENCOUNTER — Telehealth: Payer: Self-pay

## 2018-03-28 DIAGNOSIS — E781 Pure hyperglyceridemia: Secondary | ICD-10-CM

## 2018-03-28 LAB — MICROALBUMIN / CREATININE URINE RATIO
Creatinine, Urine: 160.3 mg/dL
MICROALB/CREAT RATIO: 59 mg/g{creat} — AB (ref 0–29)
MICROALBUM., U, RANDOM: 94.8 ug/mL

## 2018-03-28 LAB — LIPID PANEL
CHOLESTEROL TOTAL: 220 mg/dL — AB (ref 100–199)
Chol/HDL Ratio: 4.5 ratio — ABNORMAL HIGH (ref 0.0–4.4)
HDL: 49 mg/dL (ref >39)
LDL CALC: 116 mg/dL — AB (ref 0–99)
Triglycerides: 273 mg/dL — ABNORMAL HIGH (ref 0–149)
VLDL Cholesterol Cal: 55 mg/dL — ABNORMAL HIGH (ref 5–40)

## 2018-03-28 MED ORDER — ATORVASTATIN CALCIUM 40 MG PO TABS: 40.0000 mg | ORAL_TABLET | Freq: Every day | ORAL | 2 refills | Status: DC

## 2018-03-28 MED ORDER — FENOFIBRATE 48 MG PO TABS: 48.0000 mg | ORAL_TABLET | Freq: Every day | ORAL | 3 refills | Status: DC

## 2018-03-28 NOTE — Telephone Encounter (Signed)
Tricor was sent. Thank you

## 2018-03-28 NOTE — Telephone Encounter (Signed)
I received a PA for this patient for the Lovaza, this is only covered by [facility] for patients with triglycerides >500mg /dl. This patients last three levels were 273, 479, 300. She is not a candidate for a PA on this drug, the preferred alternatives are Fenofibrate or Gemfibrozil   Please advise and send alternative if acceptable.

## 2018-03-29 ENCOUNTER — Telehealth: Payer: Self-pay

## 2018-03-29 NOTE — Telephone Encounter (Signed)
-----   Message from [name], NP sent at 03/28/2018  2:19 PM EST ----- Also microalbumin shows microscopic diabetic kidney damage. Remember to take your losartan as prescribed.

## 2018-03-29 NOTE — Telephone Encounter (Signed)
-----   Message from [name], NP sent at 03/28/2018  9:28 AM EST ----- Cholesterol levels are very elevated. I am increasing your atorvastatin to 40mg . You should be taking this along with the lovaza or omega 3

## 2018-03-29 NOTE — Telephone Encounter (Signed)
CMA attempt to reach patient to inform on results.  No answer and left a VM on home and mobile number for a call back.

## 2018-04-09 NOTE — Telephone Encounter (Signed)
CMA attempt to reach patient. No answer and left a VM.  A letter will be send to reach out to patient.

## 2018-04-23 ENCOUNTER — Telehealth: Payer: Self-pay | Admitting: Nurse Practitioner

## 2018-04-23 NOTE — Telephone Encounter (Signed)
Called patient to confirm appt for tomorrow with Lurena Joiner and patient also requested her lab results. Please f/u

## 2018-04-24 ENCOUNTER — Other Ambulatory Visit: Payer: Self-pay

## 2018-04-24 ENCOUNTER — Ambulatory Visit: Payer: Medicaid Other | Attending: Family Medicine | Admitting: Pharmacist

## 2018-04-24 ENCOUNTER — Encounter: Payer: Self-pay | Admitting: Pharmacist

## 2018-04-24 DIAGNOSIS — E118 Type 2 diabetes mellitus with unspecified complications: Secondary | ICD-10-CM

## 2018-04-24 MED ORDER — METFORMIN HCL 1000 MG PO TABS: 1000.0000 mg | ORAL_TABLET | Freq: Two times a day (BID) | ORAL | 0 refills | Status: DC

## 2018-04-24 MED ORDER — GLUCOSE BLOOD VI STRP: ORAL_STRIP | 11 refills | Status: DC

## 2018-04-24 MED ORDER — ACCU-CHEK GUIDE ME W/DEVICE KIT: 1.0000 | PACK | Freq: Every day | 0 refills | Status: DC

## 2018-04-24 NOTE — Progress Notes (Signed)
S:    PCP: [name]  No chief complaint on file.  Patientin good spirits.Service for DMmanagementwas provided via telemedicine. Patient is currently in her home; I am corresponding from my office in clinic. After providing name and DOB verification, pt is consensual to proceed with telephone visit.  Presents for diabetes management at the request of Zelda. Patient was referred on 03/27/2018. Glimepiride was increased and Victoza added at that appointment.   Family/Social History:  - FHx: denies fhx of DM, heart disease, HTN - does not HLD (father) - Tobacco: never smoker - Alcohol: denies use  Insurance coverage/medication affordability:  - [place] Medicaid  Patient denies adherence with medications.  Current diabetes medications include: glimepiride 8 mg daily (two 4mg  tablets daily - only taking 1 tablet a day), Janumet 50-1000 mg BID (not taking), and Victoza 1.8 mg daily (not taking) Current hypertension medications include: HCTZ 25 mg daily, losartan 50 mg  Patient denies hypoglycemic events.  Patient reported dietary habits: - Reports eating vegetables  - Limits carbohydrates   Patient-reported exercise habits:  - Walks daily   Patient denies polyuria, polydipsia.  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 Patient denies visual changes. Patient reports self foot exams.   O:  Not checking blood sugar at home - never received meter from pharmacy.   Lab Results  Component Value Date   HGBA1C 11.0 (A) 03/27/2018   There were no vitals filed for this visit.  Lipid Panel     Component Value Date/Time   CHOL 220 (H) 03/27/2018 1044   TRIG 273 (H) 03/27/2018 1044   HDL 49 03/27/2018 1044   CHOLHDL 4.5 (H) 03/27/2018 1044   CHOLHDL 4.6 05/19/2014 0625   VLDL 54 (H) 05/19/2014 0625   LDLCALC 116 (H) 03/27/2018 1044   Clinical ASCVD: No  The 10-year ASCVD risk score Mikey Bussing DC Jr., et al., 2013) is: 13.6%   Values used to calculate the  score:     Age: 63 years     Sex: Female     Is Non-Hispanic African American: No     Diabetic: Yes     Tobacco smoker: No     Systolic Blood Pressure: 409 mmHg     Is BP treated: Yes     HDL Cholesterol: 49 mg/dL     Total Cholesterol: 220 mg/dL   A/P: Diabetes longstanding currently uncontrolled. Patient is able to verbalize appropriate hypoglycemia management plan. Patient is not adherent with medication. Control is suboptimal due to non-compliance.   Have coordinated with pharmacy here; medications are being verified to send out to patient today. This includes her meter. I have counseled the pt thoroughly on compliance. She verbalizes understanding concerning administration of Victoza; she understands how to check her sugar at home. I will follow-up next week to reassess.   Will stop Janumet as it is not recommended to be on a DPP-4 inhibitor + GLP-1 RA concurrently. Will send for single agent metformin at 1g BID. Will keep SU for now, but this may be discontinued in the future once we add the GLP-1 RA to minimizde hypo risk.   -Stop Janumet -Commence single agent metformin 1000 mg BID -Continue glimepiride for now  -Commence Victoza once received  -Extensively discussed pathophysiology of DM, recommended lifestyle interventions, dietary effects on glycemic control -Counseled on s/sx of and management of hypoglycemia -Next A1C anticipated 06/2018.   ASCVD risk - primary prevention in patient with DM. Last LDL is not controlled. Continue  atorvastatin 40 mg daily.  -Continued atorvastatin 40 mg.   HM: pt is UTD with appropriate vaccines.   Written patient instructions provided.  Total time counseling over the phone 10 minutes.   Follow up telemedicine visit 05/03/18.     Benard Halsted, PharmD, [place] [phone]

## 2018-04-26 NOTE — Telephone Encounter (Signed)
CMA attempt to reach patient. No answer and left a VM.

## 2018-05-03 ENCOUNTER — Other Ambulatory Visit: Payer: Self-pay

## 2018-05-03 ENCOUNTER — Ambulatory Visit: Payer: Medicaid Other | Attending: Nurse Practitioner | Admitting: Pharmacist

## 2018-05-03 ENCOUNTER — Encounter: Payer: Self-pay | Admitting: Pharmacist

## 2018-05-03 DIAGNOSIS — E118 Type 2 diabetes mellitus with unspecified complications: Secondary | ICD-10-CM

## 2018-05-03 NOTE — Progress Notes (Signed)
    S:    PCP: [name]  No chief complaint on file.  Patientin good spirits.Service for DMmanagementwas provided via telemedicine. Patient is currently in her home; I am corresponding from my office in clinic. After providing name and DOB verification, pt is consensual to proceed with telephone visit.  Presents for diabetes management at the request of Zelda. Patient was referred on 03/27/2018. I last saw her via telemedicine on 04/24/18.   Family/Social History:  - FHx: denies fhx of DM, heart disease, HTN - does not HLD (father) - Tobacco: never smoker - Alcohol: denies use  Insurance coverage/medication affordability:  - [place] Medicaid  Patient denies adherence with medications.  Current diabetes medications include: glimepiride 4 mg (2 tablets in the morning), metformin 1000 mg BID, Victoza 0.6 mg daily (not taking) Current hypertension medications include: HCTZ 25 mg daily, losartan 50 mg  Patient denies hypoglycemic events.  Patient reported dietary habits: - Reports eating vegetables  - Limits carbohydrates  - Drinking "green" blend of juices  Patient-reported exercise habits:  - Walks daily   Patient denies polyuria, polydipsia.  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 Patient denies visual changes. Patient reports self foot exams.   O:  Home fasting CBG: 130s Home post-prandial: 120s-130s  Lab Results  Component Value Date   HGBA1C 11.0 (A) 03/27/2018   There were no vitals filed for this visit.  Lipid Panel     Component Value Date/Time   CHOL 220 (H) 03/27/2018 1044   TRIG 273 (H) 03/27/2018 1044   HDL 49 03/27/2018 1044   CHOLHDL 4.5 (H) 03/27/2018 1044   CHOLHDL 4.6 05/19/2014 0625   VLDL 54 (H) 05/19/2014 0625   LDLCALC 116 (H) 03/27/2018 1044   Clinical ASCVD: No  The 10-year ASCVD risk score Mikey Bussing DC Jr., et al., 2013) is: 13.6%   Values used to calculate the score:     Age: 63 years     Sex: Female     Is  Non-Hispanic African American: No     Diabetic: Yes     Tobacco smoker: No     Systolic Blood Pressure: 833 mmHg     Is BP treated: Yes     HDL Cholesterol: 49 mg/dL     Total Cholesterol: 220 mg/dL   A/P: Diabetes longstanding currently uncontrolled based on A1c, however, her home sugars appear controlled. Patient is able to verbalize appropriate hypoglycemia management plan. Patient is not adherent with medication. I will have her return for an in-clinic med rec + Victoza teaching to ensure that her sugars are controlled and that she is taking what is prescribed for her.  -Continue single agent metformin 1000 mg BID -Continue glimepiride for now  -Commence Victoza once educated to do so. -Extensively discussed pathophysiology of DM, recommended lifestyle interventions, dietary effects on glycemic control -Counseled on s/sx of and management of hypoglycemia -Next A1C anticipated 06/2018.   ASCVD risk - primary prevention in patient with DM. Last LDL is not controlled. Continue atorvastatin 40 mg daily.  -Continued atorvastatin 40 mg.   HM: pt is UTD with appropriate vaccines.   Written patient instructions provided.  Total time counseling over the phone 10 minutes.   Follow up telemedicine visit 05/09/18.     Benard Halsted, PharmD, [place] [phone]

## 2018-05-08 ENCOUNTER — Telehealth: Payer: Self-pay | Admitting: Hematology

## 2018-05-08 ENCOUNTER — Telehealth: Payer: Self-pay | Admitting: Pain Medicine

## 2018-05-08 NOTE — Telephone Encounter (Signed)
Called patient regarding rescheduled appointment with an interpreter, patient is notified.

## 2018-05-08 NOTE — Telephone Encounter (Signed)
Rescheduled 4/24 appt to July per sch msg. Will call patient with interpreter. Mailed printout.

## 2018-05-09 ENCOUNTER — Encounter: Payer: Self-pay | Admitting: Pharmacist

## 2018-05-09 ENCOUNTER — Ambulatory Visit: Payer: Medicaid Other | Attending: Family Medicine | Admitting: Pharmacist

## 2018-05-09 ENCOUNTER — Other Ambulatory Visit: Payer: Self-pay

## 2018-05-09 DIAGNOSIS — Z79899 Other long term (current) drug therapy: Secondary | ICD-10-CM | POA: Insufficient documentation

## 2018-05-09 DIAGNOSIS — E118 Type 2 diabetes mellitus with unspecified complications: Secondary | ICD-10-CM | POA: Diagnosis not present

## 2018-05-09 DIAGNOSIS — Z7984 Long term (current) use of oral hypoglycemic drugs: Secondary | ICD-10-CM | POA: Diagnosis not present

## 2018-05-09 NOTE — Progress Notes (Signed)
S:     No chief complaint on file.  Patient arrives in good spirits. Presents for diabetes management at the request of Zelda. Patient was referred on 03/27/18. I have been corresponding with the patient via telephone but after a couple of visits, I decided that she needed in-person education on Victoza injection technique.   Family/Social History:  - FHx: denies fhx of DM, heart disease or HTN - does note HLD (father) - Tobacco: never smoker  - Alcohol: denies use   Insurance coverage/medication affordability:  - [place] Medicaid  Patient denies adherence with Victoza but endorses compliance with oral medications.  Current diabetes medications include: metformin 1000 mg BID, glimepiride 8 mg (2 of the 4 mg tablets) daily, Victoza 0.6 mg daily  Patient denies hypoglycemic events.  Patient reported dietary habits:  - Reports cutting out carbohydrates; eats beans daily but limits her portion size - Denies drinking or eating sweetened beverages or foods - Does drink vegetable/fruit blend in the morning (notes apples and pears) - Denies intake of melons, citrus, bananas, or papaya   Patient-reported exercise habits:  - Walks daily    Patient denies nocturia.  Patient denies neuropathy. Patient denies visual changes. Patient reports self foot exams.   O:  Glucose not taken at today's visit. Per patient's meter: 7 day avg 205; 14 day avg 210   Lab Results  Component Value Date   HGBA1C 11.0 (A) 03/27/2018   There were no vitals filed for this visit.   Lipid Panel     Component Value Date/Time   CHOL 220 (H) 03/27/2018 1044   TRIG 273 (H) 03/27/2018 1044   HDL 49 03/27/2018 1044   CHOLHDL 4.5 (H) 03/27/2018 1044   CHOLHDL 4.6 05/19/2014 0625   VLDL 54 (H) 05/19/2014 0625   LDLCALC 116 (H) 03/27/2018 1044   Clinical ASCVD: No  The 10-year ASCVD risk score Mikey Bussing DC Jr., et al., 2013) is: 13.6%   Values used to calculate the score:     Age: 22 years     Sex: Female  Is Non-Hispanic African American: No     Diabetic: Yes     Tobacco smoker: No     Systolic Blood Pressure: 315 mmHg     Is BP treated: Yes     HDL Cholesterol: 49 mg/dL     Total Cholesterol: 220 mg/dL   A/P: Diabetes longstanding currently uncontrolled. Patient is able to verbalize appropriate hypoglycemia management plan. Patient is not adherent with Victoza. Control is suboptimal due to this.   During previous telephone encounters, patient reported goal home CBGs in the 100-130 range. Today, however, her meter shows otherwise. Patient with averages in the low 200s. No hypoglycemia, no blood sugars >300. Victoza should offer improved glycemic control  Pt was counseled extensively on Victoza. She was educated on the use of the pen and was able to demonstrate proper technique. In fact, we had her inject her first dose during today's encounter. She has been counseled on the appropriate dose titration but I will call her next week to check on her progress.   -Continued current regimen. -Education provided on the use of the Victoza pen. Reviewed necessary supplies and operation of the pen. All questions and concerns were addressed. -Extensively discussed pathophysiology of DM, recommended lifestyle interventions, dietary effects on glycemic control -Counseled on s/sx of and management of hypoglycemia -Next A1C anticipated 06/2018.   ASCVD risk - primary prevention in patient with DM. Last LDL is not  controlled. ASCVD risk score is not >20%  - Continue atorvastatin 40 mg daily.  -Continued atorvastatin 40 mg.   HM: UTD on appropriate vaccines. Appropriate labs for metrics collected including annual lipid and microalb:creatinine ratio. Of note, microalb ratio elevated at 59 - pt endorses compliance with losartan.  - Continue losartan 50 mg daily   Written patient instructions provided.  Total time in face to face counseling 30 minutes.   Follow up Pharmacist telephone visit in 1 week.    Benard Halsted, PharmD, [place] [phone]

## 2018-05-09 NOTE — Patient Instructions (Signed)
Gracias por venir a verme hoy. Por favor haga lo siguiente:   1.Continuar con metformina y glimepirida.  2. Comience a tomar Victoza todos los das por la [place]. Tome 0.6 mg [employer] das durante una semana. El prximo mircoles, aumente a la dosis de 1.2 mg. Tome esta dosis todos los das durante 1 semana. Luego, aumente a la dosis de 1.8 mg. Una vez que llegue a esta dosis, contine indefinidamente.  3. Contine controlando el azcar en la sangre en casa. 4. Continen haciendo los cambios de estilo de vida que hemos discutido juntos durante nuestra visita. La dieta y el ejercicio juegan un papel importante en la mejora del azcar en la sangre.  5. Haz un seguimiento conmigo por telfono la prxima semana.     English:  Thank you for coming to see me today. Please do the following:  1. Continue metformin and glimepiride.  2. Start taking Victoza every day in the morning. Take 0.6 mg every day for one week. Next Wednesday, increase to the 1.2 mg dose. Take this dose every day for 1 week. Then, increase to the 1.8 mg dose. Once you get to this dose, continue it indefinitely.  3. Continue checking blood sugars at home.  4. Continue making the lifestyle changes we've discussed together during our visit. Diet and exercise play a significant role in improving your blood sugars.  5. Follow-up with me by telephone next week.

## 2018-05-10 ENCOUNTER — Telehealth: Payer: Self-pay | Admitting: Hematology

## 2018-05-10 NOTE — Telephone Encounter (Signed)
Called patient regarding cancellation and rescheduled appointment for July, patient is notified.

## 2018-05-16 ENCOUNTER — Encounter: Payer: Self-pay | Admitting: Pharmacist

## 2018-05-16 ENCOUNTER — Ambulatory Visit: Payer: Medicaid Other | Attending: Nurse Practitioner | Admitting: Pharmacist

## 2018-05-16 ENCOUNTER — Other Ambulatory Visit: Payer: Self-pay

## 2018-05-16 DIAGNOSIS — E118 Type 2 diabetes mellitus with unspecified complications: Secondary | ICD-10-CM

## 2018-05-16 MED ORDER — SITAGLIPTIN PHOS-METFORMIN HCL 50-1000 MG PO TABS: 1.0000 | ORAL_TABLET | Freq: Two times a day (BID) | ORAL | 2 refills | Status: DC

## 2018-05-16 NOTE — Progress Notes (Addendum)
S:     No chief complaint on file.  I connected with Mrs. Greenhalgh on 05/16/18 via telemedicine and verified that I am speaking with the correct person using two identifiers.  Consent:  I discussed the limitations, risks, security and privacy concerns of performing an evaluation and management service by video visit and the availability of in person appointments. I also discussed with the patient that there may be a patient responsible charge related to this service. The patient expressed understanding and agreed to proceed.   Patient in good spirits. Presents for diabetes management at the request of Zelda. Patient was referred on 03/27/18. We met last week to initiate Victoza.   Family/Social History:  - FHx: denies fhx of DM, heart disease or HTN - does note HLD (father) - Tobacco: never smoker  - Alcohol: denies use   Insurance coverage/medication affordability:  - [place] Medicaid  Patient denies adherence with Victoza but endorses compliance with oral medications. Patient reports multiple episodes of vomiting after taking first Victoza injection. This resolved after discontinuation.   Current diabetes medications include:  - metformin 1000 mg BID,  - glimepiride 8 mg (2 of the 4 mg tablets) daily - Victoza 0.6 mg daily   Patient denies hypoglycemic events.  Patient reported dietary habits:  - Reports cutting out carbohydrates; eats beans daily but limits her portion size - Denies drinking or eating sweetened beverages or foods - Does drink vegetable/fruit blend in the morning (notes apples and pears) - Denies intake of melons, citrus, bananas, or papaya   Patient-reported exercise habits:  - Walks daily    Patient denies nocturia.  Patient denies neuropathy. Patient denies visual changes. Patient reports self foot exams.    O:  Took at home during today's telemedicine visit: 170 (fasting) Home CBG: reports 120-130   Lab Results  Component Value Date   HGBA1C 11.0 (A) 03/27/2018   There were no vitals filed for this visit.   Lipid Panel     Component Value Date/Time   CHOL 220 (H) 03/27/2018 1044   TRIG 273 (H) 03/27/2018 1044   HDL 49 03/27/2018 1044   CHOLHDL 4.5 (H) 03/27/2018 1044   CHOLHDL 4.6 05/19/2014 0625   VLDL 54 (H) 05/19/2014 0625   LDLCALC 116 (H) 03/27/2018 1044   Clinical ASCVD: No  The 10-year ASCVD risk score Mikey Bussing DC Jr., et al., 2013) is: 13.6%   Values used to calculate the score:     Age: 80 years     Sex: Female     Is Non-Hispanic African American: No     Diabetic: Yes     Tobacco smoker: No     Systolic Blood Pressure: 518 mmHg     Is BP treated: Yes     HDL Cholesterol: 49 mg/dL     Total Cholesterol: 220 mg/dL   A/P: Diabetes longstanding currently uncontrolled. Patient is able to verbalize appropriate hypoglycemia management plan. Patient is not adherent with Victoza.  During previous telephone encounters, patient reported goal home CBGs in the 100-130 range. Today, pt took blood sugar at her home and reports 170 fasting. We will stop Victoza d/t side effects. Will restart Janumet and continue glimepiride until PCP follow-up.   -Stop single metformin and Victoza -Restart Janumet 50-1000 mg BID -Continue glimepiride 4 mg, 2 tablets daily before breakfast -Extensively discussed pathophysiology of DM, recommended lifestyle interventions, dietary effects on glycemic control -Counseled on s/sx of and management of hypoglycemia -Next A1C anticipated 06/2018.  ASCVD risk - primary prevention in patient with DM. Last LDL is not controlled. ASCVD risk score is not >20%  - Continue atorvastatin 40 mg daily.  -Continued atorvastatin 40 mg.   HM: UTD on appropriate vaccines. Appropriate labs for metrics collected including annual lipid and microalb:creatinine ratio. Of note, microalb ratio elevated at 59 - pt endorses compliance with losartan.  - Continue losartan 50 mg daily   Written patient  instructions provided. Total time counseling over the phone 10 minutes.   Follow up w/ PCP in June for DM follow-up.  Benard Halsted, PharmD, [name] [phone]

## 2018-05-18 ENCOUNTER — Ambulatory Visit: Payer: Self-pay | Admitting: Hematology

## 2018-05-18 ENCOUNTER — Other Ambulatory Visit: Payer: Self-pay

## 2018-05-25 ENCOUNTER — Encounter: Payer: Self-pay | Admitting: *Deleted

## 2018-05-28 ENCOUNTER — Telehealth: Payer: Self-pay | Admitting: Nurse Practitioner

## 2018-05-28 NOTE — Telephone Encounter (Signed)
Scheduled SCP per sch msg for October. Mailed printout

## 2018-06-22 ENCOUNTER — Telehealth: Payer: Self-pay

## 2018-06-22 NOTE — Telephone Encounter (Signed)
Called patient for hypoglycemia education. Left HIPAA compliant message. Will attempt to call patient next week.  Isaias Sakai, Sherian Rein D PGY1 Pharmacy Resident  Phone [phone] 06/22/2018      12:26 PM

## 2018-06-27 ENCOUNTER — Ambulatory Visit: Payer: Medicaid Other | Attending: Nurse Practitioner | Admitting: Nurse Practitioner

## 2018-06-27 ENCOUNTER — Other Ambulatory Visit: Payer: Self-pay

## 2018-06-27 ENCOUNTER — Encounter: Payer: Self-pay | Admitting: Nurse Practitioner

## 2018-06-27 VITALS — BP 129/71 | HR 69 | Temp 98.9°F | Ht 59.0 in | Wt 124.0 lb

## 2018-06-27 DIAGNOSIS — E118 Type 2 diabetes mellitus with unspecified complications: Secondary | ICD-10-CM

## 2018-06-27 DIAGNOSIS — E783 Hyperchylomicronemia: Secondary | ICD-10-CM

## 2018-06-27 DIAGNOSIS — I10 Essential (primary) hypertension: Secondary | ICD-10-CM

## 2018-06-27 DIAGNOSIS — E1165 Type 2 diabetes mellitus with hyperglycemia: Secondary | ICD-10-CM | POA: Diagnosis not present

## 2018-06-27 DIAGNOSIS — E11649 Type 2 diabetes mellitus with hypoglycemia without coma: Secondary | ICD-10-CM | POA: Diagnosis not present

## 2018-06-27 DIAGNOSIS — Z1211 Encounter for screening for malignant neoplasm of colon: Secondary | ICD-10-CM | POA: Diagnosis not present

## 2018-06-27 DIAGNOSIS — E785 Hyperlipidemia, unspecified: Secondary | ICD-10-CM | POA: Insufficient documentation

## 2018-06-27 DIAGNOSIS — Z794 Long term (current) use of insulin: Secondary | ICD-10-CM | POA: Diagnosis not present

## 2018-06-27 DIAGNOSIS — Z79899 Other long term (current) drug therapy: Secondary | ICD-10-CM | POA: Insufficient documentation

## 2018-06-27 DIAGNOSIS — Z7901 Long term (current) use of anticoagulants: Secondary | ICD-10-CM | POA: Diagnosis not present

## 2018-06-27 LAB — POCT GLYCOSYLATED HEMOGLOBIN (HGB A1C): Hemoglobin A1C: 10.6 % — AB (ref 4.0–5.6)

## 2018-06-27 LAB — GLUCOSE, POCT (MANUAL RESULT ENTRY): POC Glucose: 290 mg/dl — AB (ref 70–99)

## 2018-06-27 MED ORDER — FENOFIBRATE 48 MG PO TABS: 48.0000 mg | ORAL_TABLET | Freq: Every day | ORAL | 0 refills | Status: DC

## 2018-06-27 MED ORDER — EMPAGLIFLOZIN 10 MG PO TABS: 10.0000 mg | ORAL_TABLET | Freq: Every day | ORAL | 0 refills | Status: AC

## 2018-06-27 NOTE — Progress Notes (Signed)
Assessment & Plan:  Amanda Duarte was seen today for hospitalization follow-up.  Diagnoses and all orders for this visit:  Type 2 diabetes mellitus with complication, without long-term current use of insulin (HCC) -     Glucose (CBG) -     HgB A1c -     empagliflozin (JARDIANCE) 10 MG TABS tablet; Take 10 mg by mouth daily. Controlled Continue medications as prescribed.  Continue blood sugar control as discussed in office today, low carbohydrate diet, and regular physical exercise as tolerated, 150 minutes per week (30 min each day, 5 days per week, or 50 min 3 days per week). Keep blood sugar logs with fasting goal of 90-130 mg/dl, post prandial (after you eat) less than 180.  For Hypoglycemia: BS <60 and Hyperglycemia BS >400; contact the clinic ASAP. Annual eye exams and foot exams are recommended.   Mixed hyperglyceridemia -     Lipid panel -     fenofibrate (TRICOR) 48 MG tablet; Take 1 tablet (48 mg total) by mouth daily. INSTRUCTIONS: Work on a low fat, heart healthy diet and participate in regular aerobic exercise program by working out at least 150 minutes per week; 5 days a week-30 minutes per day. Avoid red meat, fried foods. junk foods, sodas, sugary drinks, unhealthy snacking, alcohol and smoking.  Drink at least 48oz of water per day and monitor your carbohydrate intake daily.   Essential hypertension Continue all antihypertensives as prescribed.  Remember to bring in your blood pressure log with you for your follow up appointment.  DASH/Mediterranean Diets are healthier choices for HTN.   Colon cancer screening -     Fecal occult blood, imunochemical    Patient has been counseled on age-appropriate routine health concerns for screening and prevention. These are reviewed and up-to-date. Referrals have been placed accordingly. Immunizations are up-to-date or declined.    Subjective:   Chief Complaint  Patient presents with  . Hospitalization Follow-up    Pt. is following  up on diabetes.    HPI Amanda Duarte 63 y.o. female presents to office today for follow up to DM TYPE 2, HPL and HTN. Overall feeling well.   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 She is s/p neoadjuvant ddAC-TC, left mastectomy and adjuvant radiation. and Tibial plateau fracture, left.  DM TYPE 2 Poorly controlled by medication compliance taking glimepiride  mg daily and Janumet 50-1000 mg twice daily.  She was taken off Victoza due to hypoglycemia and nausea with vomiting.  Currently monitoring blood sugars twice a day with average fasting readings 140-150. Post Prandial readings: 130-150s.  Denies any hypoglycemic symptoms. Will start Jardiance today.  Lab Results  Component Value Date   HGBA1C 10.6 (A) 06/27/2018   Lab Results  Component Value Date   HGBA1C 11.0 (A) 03/27/2018    ESSENTIAL HYPERTENSION Chronic and well controlled. She does not have a monitor. Endorses medication compliance taking losartan 50 mg daily and hydrochlorothiazide 25 mg daily. Denies chest pain, shortness of breath, palpitations, lightheadedness, dizziness, headaches or BLE edema.  BP Readings from Last 3 Encounters:  06/27/18 129/71  03/27/18 139/74  03/26/18 137/61   Mixed hypertriglyceridemia LDL not at goal for diabetes.  She endorses medication compliance taking atorvastatin 40 mg daily and TriCor 48 mg daily however both prescriptions looked  to be expired based on med review.  Lipid profile will be obtained today.  She denies any statin intolerance or myalgias. Lab Results  Component Value Date   LDLCALC 116 (H) 03/27/2018   Review of Systems  Constitutional: Negative for fever, malaise/fatigue and weight loss.  HENT: Negative.   Negative for nosebleeds.   Eyes: Negative.  Negative for blurred vision, double vision and photophobia.  Respiratory: Negative.  Negative for cough and shortness of breath.   Cardiovascular: Negative.  Negative for chest pain, palpitations and leg swelling.  Gastrointestinal: Negative.  Negative for heartburn, nausea and vomiting.  Musculoskeletal: Negative.  Negative for myalgias.  Neurological: Negative.  Negative for dizziness, focal weakness, seizures and headaches.  Psychiatric/Behavioral: Negative.  Negative for suicidal ideas.    Past Medical History:  Diagnosis Date  . Cancer ([place]) 03/2017   left breast cancer  . Diabetes mellitus without complication ([place])   . Hyperlipidemia   . Hypertension   . Malignant neoplasm of left breast ([place])   . Tibial plateau fracture, left    04-13-17 had ORIF    Past Surgical History:  Procedure Laterality Date  . MASTECTOMY MODIFIED RADICAL Left 11/20/2017   Procedure: LEFT MODIFIED RADICAL MASTECTOMY;  Surgeon: Fanny Skates, MD;  Location: [facility];  Service: General;  Laterality: Left;  . ORIF TIBIA PLATEAU Left 04/13/2017   Procedure: OPEN REDUCTION INTERNAL FIXATION (ORIF) TIBIAL PLATEAU;  Surgeon: Shona Needles, MD;  Location: [facility];  Service: Orthopedics;  Laterality: Left;  . PORT-A-CATH REMOVAL Right 11/20/2017   Procedure: REMOVAL PORT-A-CATH;  Surgeon: Fanny Skates, MD;  Location: [facility];  Service: General;  Laterality: Right;  . PORTACATH PLACEMENT Right 05/31/2017   Procedure: INSERTION PORT-A-CATH;  Surgeon: Fanny Skates, MD;  Location: [facility];  Service: General;  Laterality: Right;    Family History  Problem Relation Age of Onset  . Diabetes Son   . Breast cancer Neg Hx     Social History Reviewed with no changes to be made today.   Outpatient Medications Prior to Visit  Medication Sig Dispense Refill  . ACCU-CHEK FASTCLIX LANCETS MISC Use as instructed. 100 each 12  . Blood Glucose Monitoring  Suppl (ACCU-CHEK GUIDE ME) w/Device KIT 1 kit by Does not apply route daily. 1 kit 0  . glucose blood (ACCU-CHEK GUIDE) test strip Use as instructed 100 each 11  . hydrochlorothiazide (HYDRODIURIL) 25 MG tablet Take 1 tablet (25 mg total) by mouth daily. 90 tablet 3  . Insulin Pen Needle (B-D UF III MINI PEN NEEDLES) 31G X 5 MM MISC Use as instructed. Check blood glucose levels twice per day. 90 each 1  . lidocaine-prilocaine (EMLA) cream Apply to affected area once 30 g 3  . losartan (COZAAR) 50 MG tablet Take 1 tablet (50 mg total) by mouth daily. 90 tablet 3  . prochlorperazine (COMPAZINE) 10 MG tablet Take 1 tablet (10 mg total) by mouth every 6 (six) hours as needed (Nausea or vomiting). 30 tablet 1  . senna (SENOKOT) 8.6 MG TABS tablet Take 1 tablet (8.6 mg total) by mouth daily as needed for mild constipation. 10 each 0  . sitaGLIPtin-metformin (JANUMET) 50-1000 MG tablet Take 1 tablet by mouth 2 (two) times daily with a meal. 60 tablet 2  . atorvastatin (LIPITOR) 40 MG tablet Take 1 tablet (40 mg total) by mouth daily. 90 tablet 2  . glimepiride (AMARYL) 4 MG tablet Take 2 tablets (8 mg total) by mouth daily before breakfast.  180 tablet 0  . potassium chloride SA (K-DUR,KLOR-CON) 20 MEQ tablet Take 1 tablet (20 mEq total) by mouth daily. 20 tablet 0  . fenofibrate (TRICOR) 48 MG tablet Take 1 tablet (48 mg total) by mouth daily for 30 days. 30 tablet 3   No facility-administered medications prior to visit.     Allergies  Allergen Reactions  . Victoza [Liraglutide] Nausea And Vomiting       Objective:    BP 129/71 (BP Location: Right Arm, Patient Position: Sitting, Cuff Size: Normal)   Pulse 69   Temp 98.9 F (37.2 C) (Oral)   Ht 4' 11"  (1.499 m)   Wt 124 lb (56.2 kg)   SpO2 97%   BMI 25.04 kg/m  Wt Readings from Last 3 Encounters:  06/27/18 124 lb (56.2 kg)  03/27/18 125 lb 9.6 oz (57 kg)  03/26/18 126 lb 9.6 oz (57.4 kg)    Physical Exam Vitals signs and nursing note  reviewed.  Constitutional:      Appearance: She is well-developed.  HENT:     Head: Normocephalic and atraumatic.  Neck:     Musculoskeletal: Normal range of motion.  Cardiovascular:     Rate and Rhythm: Normal rate and regular rhythm.     Heart sounds: Normal heart sounds. No murmur. No friction rub. No gallop.   Pulmonary:     Effort: Pulmonary effort is normal. No tachypnea or respiratory distress.     Breath sounds: Normal breath sounds. No decreased breath sounds, wheezing, rhonchi or rales.  Chest:     Chest wall: No tenderness.  Abdominal:     General: Bowel sounds are normal.     Palpations: Abdomen is soft.  Musculoskeletal: Normal range of motion.  Skin:    General: Skin is warm and dry.  Neurological:     Mental Status: She is alert and oriented to person, place, and time.     Coordination: Coordination normal.  Psychiatric:        Behavior: Behavior normal. Behavior is cooperative.        Thought Content: Thought content normal.        Judgment: Judgment normal.          Patient has been counseled extensively about nutrition and exercise as well as the importance of adherence with medications and regular follow-up. The patient was given clear instructions to go to ER or return to medical center if symptoms don't improve, worsen or new problems develop. The patient verbalized understanding.   Follow-up: Return for new med: Jardiance; meter check. 3 months with me for A1c.   [name], FNP-BC [facility] [place], [place]   06/27/2018, 11:25 AM

## 2018-06-28 LAB — LIPID PANEL
Chol/HDL Ratio: 2.4 ratio (ref 0.0–4.4)
Cholesterol, Total: 117 mg/dL (ref 100–199)
HDL: 48 mg/dL (ref >39)
LDL Calculated: 36 mg/dL (ref 0–99)
Triglycerides: 167 mg/dL — ABNORMAL HIGH (ref 0–149)
VLDL Cholesterol Cal: 33 mg/dL (ref 5–40)

## 2018-07-05 DIAGNOSIS — C773 Secondary and unspecified malignant neoplasm of axilla and upper limb lymph nodes: Secondary | ICD-10-CM | POA: Diagnosis not present

## 2018-07-05 DIAGNOSIS — I10 Essential (primary) hypertension: Secondary | ICD-10-CM | POA: Diagnosis not present

## 2018-07-05 DIAGNOSIS — C50912 Malignant neoplasm of unspecified site of left female breast: Secondary | ICD-10-CM | POA: Diagnosis not present

## 2018-07-05 DIAGNOSIS — E119 Type 2 diabetes mellitus without complications: Secondary | ICD-10-CM | POA: Diagnosis not present

## 2018-07-12 NOTE — Progress Notes (Signed)
CMA spoke to patient to inform on lab results.  Pt. Verified DOB.  Pt. Understood.  Spanish pacific interpreter assist with the call.

## 2018-07-31 ENCOUNTER — Ambulatory Visit
Admission: RE | Admit: 2018-07-31 | Discharge: 2018-07-31 | Disposition: A | Discharge status: Home / Self Care | Payer: Medicaid Other | Source: Ambulatory Visit | Attending: Hematology | Admitting: Hematology

## 2018-07-31 ENCOUNTER — Other Ambulatory Visit: Payer: Self-pay

## 2018-07-31 DIAGNOSIS — E2839 Other primary ovarian failure: Secondary | ICD-10-CM

## 2018-07-31 DIAGNOSIS — Z1231 Encounter for screening mammogram for malignant neoplasm of breast: Secondary | ICD-10-CM | POA: Diagnosis not present

## 2018-07-31 DIAGNOSIS — Z170 Estrogen receptor positive status [ER+]: Secondary | ICD-10-CM

## 2018-07-31 DIAGNOSIS — M8589 Other specified disorders of bone density and structure, multiple sites: Secondary | ICD-10-CM | POA: Diagnosis not present

## 2018-07-31 DIAGNOSIS — C50212 Malignant neoplasm of upper-inner quadrant of left female breast: Secondary | ICD-10-CM

## 2018-07-31 DIAGNOSIS — Z780 Asymptomatic menopausal state: Secondary | ICD-10-CM | POA: Diagnosis not present
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
## 2018-08-06 ENCOUNTER — Inpatient Hospital Stay: Payer: Medicaid Other | Attending: Hematology

## 2018-08-06 ENCOUNTER — Telehealth: Payer: Self-pay | Admitting: Hematology

## 2018-08-06 ENCOUNTER — Inpatient Hospital Stay (HOSPITAL_BASED_OUTPATIENT_CLINIC_OR_DEPARTMENT_OTHER): Payer: Medicaid Other | Admitting: Hematology

## 2018-08-06 ENCOUNTER — Other Ambulatory Visit: Payer: Self-pay

## 2018-08-06 ENCOUNTER — Encounter: Payer: Self-pay | Admitting: Hematology

## 2018-08-06 VITALS — BP 138/56 | HR 60 | Temp 98.7°F | Resp 18 | Ht 59.0 in | Wt 125.1 lb

## 2018-08-06 DIAGNOSIS — Z9012 Acquired absence of left breast and nipple: Secondary | ICD-10-CM

## 2018-08-06 DIAGNOSIS — Z170 Estrogen receptor positive status [ER+]: Secondary | ICD-10-CM | POA: Diagnosis not present

## 2018-08-06 DIAGNOSIS — M25511 Pain in right shoulder: Secondary | ICD-10-CM | POA: Insufficient documentation

## 2018-08-06 DIAGNOSIS — M8580 Other specified disorders of bone density and structure, unspecified site: Secondary | ICD-10-CM | POA: Diagnosis not present

## 2018-08-06 DIAGNOSIS — Z923 Personal history of irradiation: Secondary | ICD-10-CM | POA: Diagnosis not present

## 2018-08-06 DIAGNOSIS — I10 Essential (primary) hypertension: Secondary | ICD-10-CM | POA: Insufficient documentation

## 2018-08-06 DIAGNOSIS — E118 Type 2 diabetes mellitus with unspecified complications: Secondary | ICD-10-CM

## 2018-08-06 DIAGNOSIS — E119 Type 2 diabetes mellitus without complications: Secondary | ICD-10-CM | POA: Diagnosis not present

## 2018-08-06 DIAGNOSIS — Z853 Personal history of malignant neoplasm of breast: Secondary | ICD-10-CM | POA: Diagnosis not present

## 2018-08-06 DIAGNOSIS — C50212 Malignant neoplasm of upper-inner quadrant of left female breast: Secondary | ICD-10-CM

## 2018-08-06 DIAGNOSIS — Z8781 Personal history of (healed) traumatic fracture: Secondary | ICD-10-CM | POA: Insufficient documentation

## 2018-08-06 DIAGNOSIS — Z9221 Personal history of antineoplastic chemotherapy: Secondary | ICD-10-CM | POA: Insufficient documentation

## 2018-08-06 DIAGNOSIS — G629 Polyneuropathy, unspecified: Secondary | ICD-10-CM | POA: Insufficient documentation

## 2018-08-06 LAB — CBC WITH DIFFERENTIAL (CANCER CENTER ONLY)
Abs Immature Granulocytes: 0.02 10*3/uL (ref 0.00–0.07)
Basophils Absolute: 0 10*3/uL (ref 0.0–0.1)
Basophils Relative: 0 %
Eosinophils Absolute: 3.1 10*3/uL — ABNORMAL HIGH (ref 0.0–0.5)
Eosinophils Relative: 34 %
HCT: 41.3 % (ref 36.0–46.0)
Hemoglobin: 13.8 g/dL (ref 12.0–15.0)
Immature Granulocytes: 0 %
Lymphocytes Relative: 24 %
Lymphs Abs: 2.2 10*3/uL (ref 0.7–4.0)
MCH: 32.5 pg (ref 26.0–34.0)
MCHC: 33.4 g/dL (ref 30.0–36.0)
MCV: 97.4 fL (ref 80.0–100.0)
Monocytes Absolute: 0.5 10*3/uL (ref 0.1–1.0)
Monocytes Relative: 6 %
Neutro Abs: 3.4 10*3/uL (ref 1.7–7.7)
Neutrophils Relative %: 36 %
Platelet Count: 155 10*3/uL (ref 150–400)
RBC: 4.24 MIL/uL (ref 3.87–5.11)
RDW: 13.7 % (ref 11.5–15.5)
WBC Count: 9.2 10*3/uL (ref 4.0–10.5)
nRBC: 0 % (ref 0.0–0.2)

## 2018-08-06 LAB — CMP (CANCER CENTER ONLY)
ALT: 21 U/L (ref 0–44)
AST: 25 U/L (ref 15–41)
Albumin: 4.1 g/dL (ref 3.5–5.0)
Alkaline Phosphatase: 113 U/L (ref 38–126)
Anion gap: 9 (ref 5–15)
BUN: 14 mg/dL (ref 8–23)
CO2: 26 mmol/L (ref 22–32)
Calcium: 9.7 mg/dL (ref 8.9–10.3)
Chloride: 104 mmol/L (ref 98–111)
Creatinine: 0.73 mg/dL (ref 0.44–1.00)
GFR, Est AFR Am: >60 mL/min (ref >60)
GFR, Estimated: >60 mL/min (ref >60)
Glucose, Bld: 203 mg/dL — ABNORMAL HIGH (ref 70–99)
Potassium: 4.1 mmol/L (ref 3.5–5.1)
Sodium: 139 mmol/L (ref 135–145)
Total Bilirubin: 0.5 mg/dL (ref 0.3–1.2)
Total Protein: 8.5 g/dL — ABNORMAL HIGH (ref 6.5–8.1)

## 2018-08-06 NOTE — Telephone Encounter (Signed)
Scheduled appt per 7/13 los. ° °Printed calendar and avs. °

## 2018-08-08 ENCOUNTER — Ambulatory Visit: Payer: Medicaid Other | Attending: Hematology | Admitting: Rehabilitation

## 2018-08-08 ENCOUNTER — Other Ambulatory Visit: Payer: Self-pay

## 2018-08-08 ENCOUNTER — Encounter: Payer: Self-pay | Admitting: Rehabilitation

## 2018-08-08 DIAGNOSIS — G8929 Other chronic pain: Secondary | ICD-10-CM

## 2018-08-08 DIAGNOSIS — M25512 Pain in left shoulder: Secondary | ICD-10-CM | POA: Insufficient documentation

## 2018-08-08 DIAGNOSIS — M25612 Stiffness of left shoulder, not elsewhere classified: Secondary | ICD-10-CM | POA: Diagnosis not present

## 2018-08-08 DIAGNOSIS — R293 Abnormal posture: Secondary | ICD-10-CM

## 2018-08-08 NOTE — Therapy (Signed)
[place], [place], [address] Phone: [phone]   Fax:  [phone]  Physical Therapy Evaluation  Patient Details  Name: Amanda Duarte MRN: [id number] Date of Birth: 1955/03/30 Referring Provider (PT): [name]   Encounter Date: 08/08/2018  PT End of Session - 08/08/18 1620    Visit Number  1    Number of Visits  12   MD not medicaid   Date for PT Re-Evaluation  10/08/18    Authorization Type  medicaid needs approval    PT Start Time  1135    PT Stop Time  1215    PT Time Calculation (min)  40 min    Activity Tolerance  Patient tolerated treatment well    Behavior During Therapy  [facility] for tasks assessed/performed       Past Medical History:  Diagnosis Date  . Cancer ([place] [facility]) 03/2017   left breast cancer  . Diabetes mellitus without complication ([place])   . Hyperlipidemia   . Hypertension   . Malignant neoplasm of left breast ([place])   . Tibial plateau fracture, left    04-13-17 had ORIF    Past Surgical History:  Procedure Laterality Date  . MASTECTOMY Left 2019  . MASTECTOMY MODIFIED RADICAL Left 11/20/2017   Procedure: LEFT MODIFIED RADICAL MASTECTOMY;  Surgeon: Fanny Skates, MD;  Location: [place];  Service: General;  Laterality: Left;  . ORIF TIBIA PLATEAU Left 04/13/2017   Procedure: OPEN REDUCTION INTERNAL FIXATION (ORIF) TIBIAL PLATEAU;  Surgeon: Shona Needles, MD;  Location: [place];  Service: Orthopedics;  Laterality: Left;  . PORT-A-CATH REMOVAL Right 11/20/2017   Procedure: REMOVAL PORT-A-CATH;  Surgeon: Fanny Skates, MD;  Location: [place];  Service: General;  Laterality: Right;  . PORTACATH PLACEMENT Right 05/31/2017   Procedure: INSERTION PORT-A-CATH;  Surgeon: Fanny Skates, MD;  Location: [place];  Service: General;  Laterality: Right;    There were no vitals filed for this visit.   Subjective Assessment - 08/08/18 1139    Subjective  I can't move my  Lt arm and it hurts in the shoulder.    Patient is accompained by:  Interpreter    Pertinent History  Pt presents s/p Lt modified radical mastectomy on 11/20/17 due to triple negative breast cancer. Radiation completed 01/02/18-02/14/18. Chemotherapy completed neoadjuvantly.  Osteopenia, DM, HTN,    Limitations  Lifting    Patient Stated Goals  move the arm more    Currently in Pain?  Yes    Pain Score  6     Pain Location  Shoulder    Pain Orientation  Left    Pain Descriptors / Indicators  Aching    Pain Type  Chronic pain    Pain Onset  More than a month ago    Pain Frequency  Intermittent    Aggravating Factors   lifting the arm    Pain Relieving Factors  rest,         [facility] PT Assessment - 08/08/18 0001      Assessment   Medical Diagnosis  Lt breast cancer    Referring Provider (PT)  [name]    Onset Date/Surgical Date  11/20/17    Hand Dominance  Right    Prior Therapy  no      Precautions   Precaution Comments  lymphedema Lt      Restrictions   Weight Bearing Restrictions  No      Balance Screen   Has  the patient fallen in the past 6 months  No    Has the patient had a decrease in activity level because of a fear of falling?   No    Is the patient reluctant to leave their home because of a fear of falling?   No      Home Social worker  Private residence    Living Arrangements  Other relatives    Available Help at Discharge  Family    Type of [place]    Additional Comments  hard to get the shirt on and off      Prior Function   Level of Independence  Independent    Vocation  Unemployed    Leisure  walking      Cognition   Overall Cognitive Status  Within Functional Limits for tasks assessed      Observation/Other Assessments   Observations  well healed incision Lt chest and 1 for a drain    Skin Integrity  dry incision; recommended more lotion      Sensation   Additional Comments  reports numb      Coordination   Gross Motor  Movements are Fluid and Coordinated  Yes      Posture/Postural Control   Posture/Postural Control  Postural limitations    Postural Limitations  Rounded Shoulders;Forward head      ROM / Strength   AROM / PROM / Strength  AROM      AROM   AROM Assessment Site  Shoulder    Right/Left Shoulder  Right;Left    Right Shoulder Flexion  165 Degrees    Right Shoulder ABduction  165 Degrees    Right Shoulder Internal Rotation  85 Degrees    Right Shoulder External Rotation  85 Degrees    Left Shoulder Flexion  111 Degrees   pn   Left Shoulder ABduction  86 Degrees   pn   Left Shoulder Internal Rotation  65 Degrees    Left Shoulder External Rotation  45 Degrees        LYMPHEDEMA/ONCOLOGY QUESTIONNAIRE - 08/08/18 1153      Type   Cancer Type  Lt breast cancer triple negative       Surgeries   Mastectomy Date  11/20/17    Sentinel Lymph Node Biopsy Date  11/20/17    Number Lymph Nodes Removed  --   2/8 positive     Treatment   Active Chemotherapy Treatment  No    Past Chemotherapy Treatment  Yes    Active Radiation Treatment  No    Past Radiation Treatment  Yes    Current Hormone Treatment  No    Past Hormone Therapy  No      What other symptoms do you have   Are you Having Heaviness or Tightness  No    Are you having Pain  No      Lymphedema Assessments   Lymphedema Assessments  Upper extremities      Right Upper Extremity Lymphedema   10 cm Proximal to Olecranon Process  25.3 cm    Olecranon Process  22.3 cm    10 cm Proximal to Ulnar Styloid Process  18.7 cm    Just Proximal to Ulnar Styloid Process  14.6 cm    Across Hand at [employer]  18.6 cm    At [place] of 2nd Digit  5.8 cm      Left Upper Extremity Lymphedema   10 cm  Proximal to Olecranon Process  25.3 cm    Olecranon Process  22.5 cm    10 cm Proximal to Ulnar Styloid Process  18.4 cm    Just Proximal to Ulnar Styloid Process  14.8 cm    Across Hand at [employer]  17.9 cm    At [place] of 2nd Digit   5.9 cm             Objective measurements completed on examination: See above findings.      [facility] Adult PT Treatment/Exercise - 08/08/18 0001      Exercises   Exercises  Other Exercises    Other Exercises   given supine dowel, standing wall flexion, dowel extension, and supine butterfly stretch each on HEP in spanish and performed with cueing             PT Education - 08/08/18 1501    Education Details  HEP, POC, lymphedema risk reduction, intial HEP    Person(s) Educated  Patient    Methods  Explanation;Demonstration;Tactile cues;Verbal cues;Handout    Comprehension  Verbalized understanding;Returned demonstration          PT Long Term Goals - 08/08/18 1633      PT LONG TERM GOAL #1   Title  Pt will improve Lt shoulder flexion to 145 deg or greater to improve mobility    Baseline  111    Time  8    Period  Weeks    Status  New      PT LONG TERM GOAL #2   Title  pt will improve Lt shoulder abduction to 145 or greater to improve mobility    Baseline  86    Time  8    Period  Weeks    Status  New      PT LONG TERM GOAL #3   Title  Pt will decrease Lt shoulder pain to 2/10 at the most    Baseline  6/10    Time  8    Period  Weeks    Status  New      PT LONG TERM GOAL #4   Title  Pt will have no difficulty getting dressed or undressed due to shoulder pain    Time  8    Period  Weeks    Status  New             Plan - 08/08/18 1623    Clinical Impression Statement  Pt presents after Lt mastectomy and removal of 8 lymph nodes due to 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  Pt reporting she did not know when she could move her shoulder again and has been fearful.  Her shoulder ROM is limited and painful with decreased scapular mobility.  No signs of lymphedema but was educated on this today.    Personal Factors and Comorbidities  Time since onset of injury/illness/exacerbation;Comorbidity 1    Comorbidities  radiation history     Examination-Activity Limitations  Lift;Reach Overhead;Dressing    Examination-Participation Restrictions  Yard Work;Cleaning;Community Activity;Laundry    Stability/Clinical Decision Making  Stable/Uncomplicated    Clinical Decision Making  Low    Rehab Potential  Excellent    PT Frequency  --   1x per week for first 3 visits and then 2x per week for 4 weeks   PT Duration  8 weeks    PT Treatment/Interventions  ADLs/Self Care Home Management;Manual techniques;Taping;Passive range of motion    PT Next Visit Plan  Lt shoulder mobility/PROM, scapular STM PRN    PT Home Exercise Plan  [email or web address]    Consulted and Agree with Plan of Care  Patient       Patient will benefit from skilled therapeutic intervention in order to improve the following deficits and impairments:  Increased fascial restricitons, Pain, Decreased range of motion, Impaired UE functional use  Visit Diagnosis: 1. Stiffness of left shoulder, not elsewhere classified   2. Chronic left shoulder pain   3. Abnormal posture        Problem List Patient Active Problem List   Diagnosis Date Noted  . Mixed hyperglyceridemia 06/27/2018  . Cancer of overlapping sites of left female breast ([place]) 11/20/2017  . Port-A-Cath in place 06/09/2017  . Malignant neoplasm of upper-inner quadrant of left breast in female, estrogen receptor positive ([place]) 05/22/2017  . Closed fracture of left tibial plateau   . Axillary lymphadenopathy 04/12/2017  . Closed fracture of lateral portion of left tibial plateau 04/11/2017  . Chest pain, neg MI, neg stress Nuc study, possible GI 05/19/2014  . Essential hypertension 05/19/2014  . Type 2 diabetes mellitus with complication, without long-term current use of insulin ([place]) 05/19/2014    Stark Bray 08/08/2018, 4:35 PM  [place], [place], [address] Phone: [phone]   Fax:  [phone]  Name: Amanda Duarte MRN: [id number] Date of Birth: 1955/08/27

## 2018-08-08 NOTE — Patient Instructions (Signed)
Access Code: [email or web address]  URL: [email or web address]  Date: 08/08/2018  Prepared by: Shan Levans   Exercises  Supine Shoulder Flexion Extension AAROM with Dowel - 10 reps - 5 seconds hold - 1-2x daily - 7x weekly  Standing shoulder flexion wall slides - 10 reps - 1-3 sets - 20-30 seconds hold - 1x daily - 7x weekly  Standing Shoulder Extension with Dowel - 10 reps - 1-3 sets - 20-30 seconds hold - 1x daily - 7x weekly  Supine Chest Stretch with Elbows Bent - 3 reps - 1 sets - 30-60seconds hold - 1x daily - 7x weekly

## 2018-08-22 ENCOUNTER — Ambulatory Visit: Payer: Medicaid Other | Admitting: Rehabilitation

## 2018-08-29 ENCOUNTER — Ambulatory Visit: Payer: Medicaid Other | Attending: Hematology | Admitting: Rehabilitation

## 2018-08-29 DIAGNOSIS — G8929 Other chronic pain: Secondary | ICD-10-CM | POA: Insufficient documentation

## 2018-08-29 DIAGNOSIS — M25512 Pain in left shoulder: Secondary | ICD-10-CM | POA: Insufficient documentation

## 2018-08-29 DIAGNOSIS — M25612 Stiffness of left shoulder, not elsewhere classified: Secondary | ICD-10-CM | POA: Insufficient documentation

## 2018-08-29 DIAGNOSIS — R293 Abnormal posture: Secondary | ICD-10-CM | POA: Insufficient documentation

## 2018-09-05 ENCOUNTER — Ambulatory Visit: Payer: Medicaid Other | Admitting: Rehabilitation

## 2018-09-12 ENCOUNTER — Encounter: Payer: Self-pay | Admitting: Rehabilitation

## 2018-09-12 ENCOUNTER — Ambulatory Visit: Payer: Medicaid Other | Admitting: Rehabilitation

## 2018-09-12 ENCOUNTER — Other Ambulatory Visit: Payer: Self-pay

## 2018-09-12 DIAGNOSIS — M25612 Stiffness of left shoulder, not elsewhere classified: Secondary | ICD-10-CM

## 2018-09-12 DIAGNOSIS — M25512 Pain in left shoulder: Secondary | ICD-10-CM | POA: Diagnosis not present

## 2018-09-12 DIAGNOSIS — R293 Abnormal posture: Secondary | ICD-10-CM

## 2018-09-12 DIAGNOSIS — G8929 Other chronic pain: Secondary | ICD-10-CM | POA: Diagnosis not present

## 2018-09-12 NOTE — Therapy (Signed)
[place], [place], [address] Phone: [phone]   Fax:  [phone]  Physical Therapy Treatment  Patient Details  Name: Amanda Duarte MRN: [id number] Date of Birth: 07-22-1955 Referring Provider (PT): [name]   Encounter Date: 09/12/2018  PT End of Session - 09/12/18 1017    Visit Number  2    Number of Visits  12    Date for PT Re-Evaluation  10/08/18    Authorization Type  3 visits until 8/25    Authorization - Visit Number  1    Authorization - Number of Visits  3    PT Start Time  0930    PT Stop Time  1016    PT Time Calculation (min)  46 min    Activity Tolerance  Patient tolerated treatment well;Patient limited by pain    Behavior During Therapy  [facility] for tasks assessed/performed       Past Medical History:  Diagnosis Date  . Cancer ([place]) 03/2017   left breast cancer  . Diabetes mellitus without complication ([place])   . Hyperlipidemia   . Hypertension   . Malignant neoplasm of left breast ([place])   . Tibial plateau fracture, left    04-13-17 had ORIF    Past Surgical History:  Procedure Laterality Date  . MASTECTOMY Left 2019  . MASTECTOMY MODIFIED RADICAL Left 11/20/2017   Procedure: LEFT MODIFIED RADICAL MASTECTOMY;  Surgeon: Fanny Skates, MD;  Location: [place];  Service: General;  Laterality: Left;  . ORIF TIBIA PLATEAU Left 04/13/2017   Procedure: OPEN REDUCTION INTERNAL FIXATION (ORIF) TIBIAL PLATEAU;  Surgeon: Shona Needles, MD;  Location: [place];  Service: Orthopedics;  Laterality: Left;  . PORT-A-CATH REMOVAL Right 11/20/2017   Procedure: REMOVAL PORT-A-CATH;  Surgeon: Fanny Skates, MD;  Location: [facility] [place];  Service: General;  Laterality: Right;  . PORTACATH PLACEMENT Right 05/31/2017   Procedure: INSERTION PORT-A-CATH;  Surgeon: Fanny Skates, MD;  Location: [facility];  Service: General;  Laterality: Right;    There were no vitals filed for this  visit.  Subjective Assessment - 09/12/18 0933    Subjective  Its still bad (pt declines interpreter services)    Pertinent History  Pt presents s/p Lt modified radical mastectomy on 11/20/17 due to triple negative breast cancer. Radiation completed 01/02/18-02/14/18. Chemotherapy completed neoadjuvantly.  Osteopenia, DM, HTN,    Currently in Pain?  Yes    Pain Score  7     Pain Location  Chest    Pain Orientation  Left    Pain Descriptors / Indicators  Aching    Pain Type  Surgical pain    Pain Onset  More than a month ago    Pain Frequency  Constant         [facility] PT Assessment - 09/12/18 0001      AROM   Left Shoulder Flexion  115 Degrees   pain   Left Shoulder ABduction  118 Degrees   pain and more scaption                  [facility] Adult PT Treatment/Exercise - 09/12/18 0001      Exercises   Exercises  Shoulder      Shoulder Exercises: Supine   Flexion  15 reps    Flexion Limitations  dowel supine      Shoulder Exercises: Pulleys   Flexion  2 minutes    Flexion Limitations  with cueing  Scaption  2 minutes    Scaption Limitations  with cueing    Other Pulley Exercises  behind the back standing x 10      Shoulder Exercises: ROM/Strengthening   Wall Wash  wall ladder flexion x 5      Manual Therapy   Manual Therapy  Soft tissue mobilization;Passive ROM;Joint mobilization    Joint Mobilization  GH AP glides in neutral for shoulder positioning    Soft tissue mobilization  to the anterior shoulder, UT, and into the anterior pectoralis with +2 ttp     Passive ROM  to the Lt shoulder to tolerance with moderate guarding.  ER with arm supported by a pillow                  PT Long Term Goals - 09/12/18 1019      PT LONG TERM GOAL #1   Title  Pt will improve Lt shoulder flexion to 145 deg or greater to improve mobility    Status  On-going      PT LONG TERM GOAL #2   Title  pt will improve Lt shoulder abduction to 145 or greater to improve  mobility    Status  On-going      PT LONG TERM GOAL #3   Title  Pt will decrease Lt shoulder pain to 2/10 at the most    Status  On-going      PT LONG TERM GOAL #4   Title  Pt will have no difficulty getting dressed or undressed due to shoulder pain    Status  On-going            Plan - 09/12/18 1017    Clinical Impression Statement  Pt returns a few weeks after eval with continued frozen left shoulder.  began AAROM and MT today tolerating well but with moderate guarding and pain past 90 deg of flexion and ER only 5deg past neutral.  minimal GH mobility with joint mobilization.  Will need renewal for more medicaid visits soon. Pt did fine without interpreter demonstrating understanding of commands and questions    PT Treatment/Interventions  ADLs/Self Care Home Management;Manual techniques;Taping;Passive range of motion;Therapeutic exercise    PT Next Visit Plan  Lt shoulder mobility/ROM, scapular STM PRN, joint mob    PT Home Exercise Plan  [email or web address]    Consulted and Agree with Plan of Care  Patient       Patient will benefit from skilled therapeutic intervention in order to improve the following deficits and impairments:     Visit Diagnosis: 1. Stiffness of left shoulder, not elsewhere classified   2. Chronic left shoulder pain   3. Abnormal posture        Problem List Patient Active Problem List   Diagnosis Date Noted  . Mixed hyperglyceridemia 06/27/2018  . Cancer of overlapping sites of left female breast ([place]) 11/20/2017  . Port-A-Cath in place 06/09/2017  . Malignant neoplasm of upper-inner quadrant of left breast in female, estrogen receptor positive ([place]) 05/22/2017  . Closed fracture of left tibial plateau   . Axillary lymphadenopathy 04/12/2017  . Closed fracture of lateral portion of left tibial plateau 04/11/2017  . Chest pain, neg MI, neg stress Nuc study, possible GI 05/19/2014  . Essential hypertension 05/19/2014  . Type 2 diabetes mellitus with  complication, without long-term current use of insulin ([place]) 05/19/2014    Stark Bray 09/12/2018, 10:21 AM  [place],  [place], [address] Phone: [phone]   Fax:  [phone]  Name: Amanda Duarte MRN: [id number] Date of Birth: September 20, 1955

## 2018-09-13 ENCOUNTER — Ambulatory Visit: Payer: Medicaid Other | Admitting: Physical Therapy

## 2018-09-13 ENCOUNTER — Encounter: Payer: Self-pay | Admitting: Physical Therapy

## 2018-09-13 DIAGNOSIS — M25612 Stiffness of left shoulder, not elsewhere classified: Secondary | ICD-10-CM

## 2018-09-13 DIAGNOSIS — R293 Abnormal posture: Secondary | ICD-10-CM | POA: Diagnosis not present

## 2018-09-13 DIAGNOSIS — M25512 Pain in left shoulder: Secondary | ICD-10-CM

## 2018-09-13 DIAGNOSIS — G8929 Other chronic pain: Secondary | ICD-10-CM

## 2018-09-13 NOTE — Therapy (Signed)
[place], [place], [address] Phone: [phone]   Fax:  [phone]  Physical Therapy Treatment  Patient Details  Name: Amanda Duarte MRN: [id number] Date of Birth: [birth year] Referring Provider (PT): [name]   Encounter Date: 09/13/2018  PT End of Session - 09/13/18 0920    Visit Number  3    Number of Visits  12    Date for PT Re-Evaluation  10/08/18    Authorization Type  3 visits until 8/25    Authorization - Visit Number  2    Authorization - Number of Visits  3    PT Start Time  0833    PT Stop Time  0917    PT Time Calculation (min)  44 min    Activity Tolerance  Patient tolerated treatment well    Behavior During Therapy  [facility] for tasks assessed/performed       Past Medical History:  Diagnosis Date  . Cancer ([place]) 03/2017   left breast cancer  . Diabetes mellitus without complication ([place])   . Hyperlipidemia   . Hypertension   . Malignant neoplasm of left breast ([place])   . Tibial plateau fracture, left    04-13-17 had ORIF    Past Surgical History:  Procedure Laterality Date  . MASTECTOMY Left 2019  . MASTECTOMY MODIFIED RADICAL Left 11/20/2017   Procedure: LEFT MODIFIED RADICAL MASTECTOMY;  Surgeon: Fanny Skates, MD;  Location: [facility];  Service: General;  Laterality: Left;  . ORIF TIBIA PLATEAU Left 04/13/2017   Procedure: OPEN REDUCTION INTERNAL FIXATION (ORIF) TIBIAL PLATEAU;  Surgeon: Shona Needles, MD;  Location: [facility];  Service: Orthopedics;  Laterality: Left;  . PORT-A-CATH REMOVAL Right 11/20/2017   Procedure: REMOVAL PORT-A-CATH;  Surgeon: Fanny Skates, MD;  Location: [place] [facility];  Service: General;  Laterality: Right;  . PORTACATH PLACEMENT Right 05/31/2017   Procedure: INSERTION PORT-A-CATH;  Surgeon: Fanny Skates, MD;  Location: [facility];  Service: General;  Laterality: Right;    There were no vitals filed for this visit.  Subjective  Assessment - 09/13/18 0834    Subjective  It is more sore since yesterday. It hurts in my upper arm and down in to my chest.    Pertinent History  Pt presents s/p Lt modified radical mastectomy on 11/20/17 due to triple negative breast cancer. Radiation completed 01/02/18-02/14/18. Chemotherapy completed neoadjuvantly.  Osteopenia, DM, HTN,    Patient Stated Goals  move the arm more    Currently in Pain?  Yes    Pain Score  6     Pain Location  Arm    Pain Orientation  Left;Upper    Pain Descriptors / Indicators  Aching    Pain Type  Surgical pain                       [facility] Adult PT Treatment/Exercise - 09/13/18 0001      Manual Therapy   Manual Therapy  Soft tissue mobilization;Passive ROM;Joint mobilization;Scapular mobilization    Joint Mobilization  GH AP glides in neutral for shoulder positioning    Soft tissue mobilization  to the anterior shoulder, area inferior to mastectomy scar, UT, and into the anterior pectoralis, also in R s/l to L lateral trunk in area of serratus and posterior shoulder where pt felt increased pain    Scapular Mobilization  in R s/l to L scapular into protraction, retraction, and depression with pt extrememly tight and  limited in all directions    Passive ROM  to the Lt shoulder to tolerance with moderate guarding, pt very limited in ER, was able to improve flexion and abduction PROM following STM especially to serratus anterior and to area inferior to mastectomy scar                  PT Long Term Goals - 09/12/18 1019      PT LONG TERM GOAL #1   Title  Pt will improve Lt shoulder flexion to 145 deg or greater to improve mobility    Status  On-going      PT LONG TERM GOAL #2   Title  pt will improve Lt shoulder abduction to 145 or greater to improve mobility    Status  On-going      PT LONG TERM GOAL #3   Title  Pt will decrease Lt shoulder pain to 2/10 at the most    Status  On-going      PT LONG TERM GOAL #4   Title  Pt  will have no difficulty getting dressed or undressed due to shoulder pain    Status  On-going            Plan - 09/13/18 0920    Clinical Impression Statement  Pt continues to demonstrate very limited left shoulder ROM. Today she seemed more limited due to pain and tenderness in muscles surrounding shoulder joint especially serratus anterior. She demonstrate improved L shoulder PROM following soft tissue mobilization to serratus and area inferior to mastectomy scar. She is most limited with external rotation. Began scapular mobilization today and pt demonstrates very little scapular mobility.    Rehab Potential  Excellent    PT Frequency  --   1x/wk for first 3 visits then 2x/wk for 4 wks   PT Duration  8 weeks    PT Treatment/Interventions  ADLs/Self Care Home Management;Manual techniques;Taping;Passive range of motion;Therapeutic exercise    PT Next Visit Plan  recert medicare for an additional 2x/wk for 4 weeks, L shoulder mobility and ROM, scapular STM PRN, joint mobs    PT Home Exercise Plan  [email or web address]    Consulted and Agree with Plan of Care  Patient       Patient will benefit from skilled therapeutic intervention in order to improve the following deficits and impairments:  Increased fascial restricitons, Pain, Decreased range of motion, Impaired UE functional use  Visit Diagnosis: 1. Stiffness of left shoulder, not elsewhere classified   2. Chronic left shoulder pain   3. Abnormal posture        Problem List Patient Active Problem List   Diagnosis Date Noted  . Mixed hyperglyceridemia 06/27/2018  . Cancer of overlapping sites of left female breast ([place]) 11/20/2017  . Port-A-Cath in place 06/09/2017  . Malignant neoplasm of upper-inner quadrant of left breast in female, estrogen receptor positive ([place]) 05/22/2017  . Closed fracture of left tibial plateau   . Axillary lymphadenopathy 04/12/2017  . Closed fracture of lateral portion of left tibial plateau 04/11/2017  .  Chest pain, neg MI, neg stress Nuc study, possible GI 05/19/2014  . Essential hypertension 05/19/2014  . Type 2 diabetes mellitus with complication, without long-term current use of insulin ([place]) 05/19/2014    Allyson Sabal [facility] 09/13/2018, 9:24 AM  [place], [place], [address] Phone: [phone]   Fax:  [phone]  Name: Amanda Duarte MRN: [id number] Date of Birth: Jul 21, 1955  Allyson Sabal [place], [place] 09/13/18 9:24 AM

## 2018-09-18 ENCOUNTER — Other Ambulatory Visit: Payer: Self-pay

## 2018-09-18 ENCOUNTER — Encounter: Payer: Self-pay | Admitting: Physical Therapy

## 2018-09-18 ENCOUNTER — Ambulatory Visit: Payer: Medicaid Other | Admitting: Physical Therapy

## 2018-09-18 DIAGNOSIS — R293 Abnormal posture: Secondary | ICD-10-CM | POA: Diagnosis not present

## 2018-09-18 DIAGNOSIS — M25612 Stiffness of left shoulder, not elsewhere classified: Secondary | ICD-10-CM

## 2018-09-18 DIAGNOSIS — G8929 Other chronic pain: Secondary | ICD-10-CM | POA: Diagnosis not present

## 2018-09-18 DIAGNOSIS — M25512 Pain in left shoulder: Secondary | ICD-10-CM | POA: Diagnosis not present

## 2018-09-18 NOTE — Therapy (Signed)
[place], [place], [address] Phone: [phone]   Fax:  [phone]  Physical Therapy Treatment  Patient Details  Name: Amanda Duarte MRN: [id number] Date of Birth: 02-May-1955 Referring Provider (PT): [name]   Encounter Date: 09/18/2018  PT End of Session - 09/18/18 1618    Visit Number  4    Number of Visits  12    Date for PT Re-Evaluation  10/08/18    Authorization Type  3 visits until 8/25, authorization requested for 12 more visits from [facility] on 8/25 f    Authorization - Visit Number  3    Authorization - Number of Visits  3    PT Start Time  1200    PT Stop Time  1245    PT Time Calculation (min)  45 min    Activity Tolerance  Patient limited by pain    Behavior During Therapy  [facility] for tasks assessed/performed       Past Medical History:  Diagnosis Date  . Cancer ([place]) 03/2017   left breast cancer  . Diabetes mellitus without complication ([place])   . Hyperlipidemia   . Hypertension   . Malignant neoplasm of left breast ([place])   . Tibial plateau fracture, left    04-13-17 had ORIF    Past Surgical History:  Procedure Laterality Date  . MASTECTOMY Left 2019  . MASTECTOMY MODIFIED RADICAL Left 11/20/2017   Procedure: LEFT MODIFIED RADICAL MASTECTOMY;  Surgeon: Fanny Skates, MD;  Location: [facility];  Service: General;  Laterality: Left;  . ORIF TIBIA PLATEAU Left 04/13/2017   Procedure: OPEN REDUCTION INTERNAL FIXATION (ORIF) TIBIAL PLATEAU;  Surgeon: Shona Needles, MD;  Location: [facility];  Service: Orthopedics;  Laterality: Left;  . PORT-A-CATH REMOVAL Right 11/20/2017   Procedure: REMOVAL PORT-A-CATH;  Surgeon: Fanny Skates, MD;  Location: [facility];  Service: General;  Laterality: Right;  . PORTACATH PLACEMENT Right 05/31/2017   Procedure: INSERTION PORT-A-CATH;  Surgeon: Fanny Skates, MD;  Location: [facility];  Service: General;  Laterality: Right;    There  were no vitals filed for this visit.  Subjective Assessment - 09/18/18 1208    Subjective  pt indicates pain in back of neck , armpit, back of arm and chest. Says it is a "a little better" and she has been doing her exercises at home  with the stick    Pertinent History  Pt presents s/p Lt modified radical mastectomy on 11/20/17 due to triple negative breast cancer. Radiation completed 01/02/18-02/14/18. Chemotherapy completed neoadjuvantly.  Osteopenia, DM, HTN,    Patient Stated Goals  move the arm more    Currently in Pain?  Yes    Pain Score  6     Pain Orientation  Left;Upper    Pain Descriptors / Indicators  Aching;Tightness    Pain Type  Chronic pain    Pain Onset  More than a month ago    Pain Frequency  Constant    Aggravating Factors   using the arm    Pain Relieving Factors  rest    Effect of Pain on Daily Activities  limits dressing         [facility] PT Assessment - 09/18/18 0001      AROM   Left Shoulder Flexion  115 Degrees    Left Shoulder ABduction  90 Degrees   tightness in chest around scar at pec major   Left Shoulder External Rotation  5 Degrees  very tight and painful                           PT Education - 09/18/18 1617    Education Details  wall stretches for left shoulder    Person(s) Educated  Patient    Methods  Explanation;Demonstration;Handout;Tactile cues;Verbal cues    Comprehension  Returned demonstration;Verbalized understanding          PT Long Term Goals - 09/18/18 1623      PT LONG TERM GOAL #1   Title  Pt will improve Lt shoulder flexion to 145 deg or greater to improve mobility    Baseline  111, on 09/18/2018: 115    Status  On-going      PT LONG TERM GOAL #2   Title  pt will improve Lt shoulder abduction to 145 or greater to improve mobility    Baseline  86,on eval, 90 on 8/25    Time  8    Period  Weeks    Status  On-going      PT LONG TERM GOAL #3   Title  Pt will decrease Lt shoulder pain to 2/10 at the  most    Baseline  6/10 on eval, 6/10 on 8/25    Period  Weeks    Status  On-going      PT LONG TERM GOAL #4   Title  Pt will have no difficulty getting dressed or undressed due to shoulder pain    Baseline  needs assist to remove shirt on 09/18/2018    Time  8    Period  Weeks    Status  On-going            Plan - 09/18/18 1619    Clinical Impression Statement  Pt continues to have very tight soft tissue in left upper quadrant with limited shoulder ROM especailly in external rotation and abduction.  She has mulitple tight trigger points in  upper shoulder scapular region, posterior shoulder and chest.  She felt some relief after session and is better able to stretch into flexion    Personal Factors and Comorbidities  Time since onset of injury/illness/exacerbation;Comorbidity 1    Comorbidities  radiation history    Examination-Participation Restrictions  Yard Work;Cleaning;Community Activity;Laundry    Rehab Potential  Excellent    PT Frequency  2x / week    PT Duration  8 weeks    PT Treatment/Interventions  ADLs/Self Care Home Management;Manual techniques;Taping;Passive range of motion;Therapeutic exercise    PT Next Visit Plan  , L shoulder mobility and ROM, scapular STM PRN, joint mobs including ribs add active assisted stretching with pulleys, ball on wall,    PT Home Exercise Plan  [id number], wall stretch for shoulder flexion    Consulted and Agree with Plan of Care  Patient       Patient will benefit from skilled therapeutic intervention in order to improve the following deficits and impairments:     Visit Diagnosis: Stiffness of left shoulder, not elsewhere classified  Chronic left shoulder pain  Abnormal posture     Problem List Patient Active Problem List   Diagnosis Date Noted  . Mixed hyperglyceridemia 06/27/2018  . Cancer of overlapping sites of left female breast ([place]) 11/20/2017  . Port-A-Cath in place 06/09/2017  . Malignant neoplasm of upper-inner  quadrant of left breast in female, estrogen receptor positive ([place]) 05/22/2017  . Closed fracture of left tibial plateau   .  Axillary lymphadenopathy 04/12/2017  . Closed fracture of lateral portion of left tibial plateau 04/11/2017  . Chest pain, neg MI, neg stress Nuc study, possible GI 05/19/2014  . Essential hypertension 05/19/2014  . Type 2 diabetes mellitus with complication, without long-term current use of insulin ([place]) 05/19/2014   Donato Heinz. Owens Shark PT  Norwood Levo 09/18/2018, 4:27 PM  [place], [place], [address] Phone: [phone]   Fax:  [phone]  Name: Amanda Duarte MRN: [id number] Date of Birth: 1955/03/28

## 2018-09-18 NOTE — Patient Instructions (Incomplete)
Closed Chain: Shoulder Flexion / Extension - on [employer] pared, de un paso hacia atrs. Regrese. Este movimiento causa la flexin y extensin del hombro. Realice ___ veces, con cada pie, ___ veces por da.  http://ss.exer.us/265   Copyright  VHI. All rights reserved.  Closed Chain: Shoulder Flexion / Extension - on Wall    Hands on wall, step backward. Return. Stepping causes shoulder flexion and extension Do ___ times, each foot, ___ times per day.  http://ss.exer.us/265   Copyright  VHI. All rights reserved.

## 2018-09-28 ENCOUNTER — Ambulatory Visit: Payer: Medicaid Other | Admitting: Nurse Practitioner

## 2018-10-19 ENCOUNTER — Encounter: Payer: Self-pay | Admitting: Nurse Practitioner

## 2018-10-19 ENCOUNTER — Other Ambulatory Visit: Payer: Self-pay

## 2018-10-19 ENCOUNTER — Ambulatory Visit: Payer: Medicaid Other | Attending: Nurse Practitioner | Admitting: Nurse Practitioner

## 2018-10-19 VITALS — Ht 59.0 in | Wt 123.0 lb

## 2018-10-19 DIAGNOSIS — Z888 Allergy status to other drugs, medicaments and biological substances status: Secondary | ICD-10-CM | POA: Diagnosis not present

## 2018-10-19 DIAGNOSIS — Z853 Personal history of malignant neoplasm of breast: Secondary | ICD-10-CM | POA: Insufficient documentation

## 2018-10-19 DIAGNOSIS — I10 Essential (primary) hypertension: Secondary | ICD-10-CM | POA: Diagnosis not present

## 2018-10-19 DIAGNOSIS — E1165 Type 2 diabetes mellitus with hyperglycemia: Secondary | ICD-10-CM | POA: Diagnosis not present

## 2018-10-19 DIAGNOSIS — Z79899 Other long term (current) drug therapy: Secondary | ICD-10-CM | POA: Diagnosis not present

## 2018-10-19 DIAGNOSIS — E118 Type 2 diabetes mellitus with unspecified complications: Secondary | ICD-10-CM | POA: Diagnosis not present

## 2018-10-19 DIAGNOSIS — Z833 Family history of diabetes mellitus: Secondary | ICD-10-CM | POA: Insufficient documentation

## 2018-10-19 DIAGNOSIS — E783 Hyperchylomicronemia: Secondary | ICD-10-CM | POA: Insufficient documentation

## 2018-10-19 DIAGNOSIS — E781 Pure hyperglyceridemia: Secondary | ICD-10-CM

## 2018-10-19 DIAGNOSIS — Z794 Long term (current) use of insulin: Secondary | ICD-10-CM | POA: Diagnosis not present

## 2018-10-19 DIAGNOSIS — Z9012 Acquired absence of left breast and nipple: Secondary | ICD-10-CM | POA: Insufficient documentation

## 2018-10-19 LAB — POCT GLYCOSYLATED HEMOGLOBIN (HGB A1C): Hemoglobin A1C: 9.3 % — AB (ref 4.0–5.6)

## 2018-10-19 LAB — GLUCOSE, POCT (MANUAL RESULT ENTRY): POC Glucose: 259 mg/dl — AB (ref 70–99)

## 2018-10-19 MED ORDER — JANUMET 50-1000 MG PO TABS: 1.0000 | ORAL_TABLET | Freq: Two times a day (BID) | ORAL | 2 refills | Status: DC

## 2018-10-19 MED ORDER — ATORVASTATIN CALCIUM 40 MG PO TABS: 40.0000 mg | ORAL_TABLET | Freq: Every day | ORAL | 2 refills | Status: DC

## 2018-10-19 MED ORDER — GLIMEPIRIDE 4 MG PO TABS: 8.0000 mg | ORAL_TABLET | Freq: Every day | ORAL | 0 refills | Status: DC

## 2018-10-19 MED ORDER — JARDIANCE 25 MG PO TABS: 25.0000 mg | ORAL_TABLET | Freq: Every day | ORAL | 0 refills | Status: AC

## 2018-10-19 MED ORDER — FENOFIBRATE 48 MG PO TABS: 48.0000 mg | ORAL_TABLET | Freq: Every day | ORAL | 0 refills | Status: DC

## 2018-10-19 NOTE — Progress Notes (Signed)
Assessment & Plan:  Diagnoses and all orders for this visit:  Type 2 diabetes mellitus with complication, without long-term current use of insulin (HCC) -     Glucose (CBG) -     HgB A1c -     glimepiride (AMARYL) 4 MG tablet; Take 2 tablets (8 mg total) by mouth daily before breakfast. -     sitaGLIPtin-metformin (JANUMET) 50-1000 MG tablet; Take 1 tablet by mouth 2 (two) times daily with a meal. Continue blood sugar control as discussed in office today, low carbohydrate diet, and regular physical exercise as tolerated, 150 minutes per week (30 min each day, 5 days per week, or 50 min 3 days per week). Keep blood sugar logs with fasting goal of 90-130 mg/dl, post prandial (after you eat) less than 180.  For Hypoglycemia: BS <60 and Hyperglycemia BS >400; contact the clinic ASAP. Annual eye exams and foot exams are recommended.  Mixed hyperglyceridemia -     fenofibrate (TRICOR) 48 MG tablet; Take 1 tablet (48 mg total) by mouth daily. -     atorvastatin (LIPITOR) 40 MG tablet; Take 1 tablet (40 mg total) by mouth daily. INSTRUCTIONS: Work on a low fat, heart healthy diet and participate in regular aerobic exercise program by working out at least 150 minutes per week; 5 days a week-30 minutes per day. Avoid red meat, fried foods. junk foods, sodas, sugary drinks, unhealthy snacking, alcohol and smoking.  Drink at least 48oz of water per day and monitor your carbohydrate intake daily.    Pure hypertriglyceridemia -     fenofibrate (TRICOR) 48 MG tablet; Take 1 tablet (48 mg total) by mouth daily. -     atorvastatin (LIPITOR) 40 MG tablet; Take 1 tablet (40 mg total) by mouth daily. INSTRUCTIONS: Work on a low fat, heart healthy diet and participate in regular aerobic exercise program by working out at least 150 minutes per week; 5 days a week-30 minutes per day. Avoid red meat, fried foods. junk foods, sodas, sugary drinks, unhealthy snacking, alcohol and smoking.  Drink at least 48oz of  water per day and monitor your carbohydrate intake daily.     Patient has been counseled on age-appropriate routine health concerns for screening and prevention. These are reviewed and up-to-date. Referrals have been placed accordingly. Immunizations are up-to-date or declined.    Subjective:  HPI Amanda Duarte 63 y.o. female presents to office today for DM F/U. She was supposed to bring her meter and log with her today however she did not.   DM TYPE 2 A1c down from 10.6 to 9.3. She is taking janumet 50-1000 mg BID and Jardiance 10 mg. Will increase Jardiance to 25 mg and have her return for meter check in 2-3 weeks. She is currently taking STATIN and ARB. She was referred to [facility] care in March. Still has not followed up for eye exam.  Lab Results  Component Value Date   HGBA1C 9.3 (A) 10/19/2018     Review of Systems  Constitutional: Negative for fever, malaise/fatigue and weight loss.  HENT: Negative.  Negative for nosebleeds.   Eyes: Negative.  Negative for blurred vision, double vision and photophobia.  Respiratory: Negative.  Negative for cough and shortness of breath.   Cardiovascular: Negative.  Negative for chest pain, palpitations and leg swelling.  Gastrointestinal: Negative.  Negative for heartburn, nausea and vomiting.  Musculoskeletal: Negative.  Negative for myalgias.  Neurological: Negative.  Negative for dizziness, focal weakness, seizures and headaches.  Psychiatric/Behavioral:  Negative.  Negative for suicidal ideas.    Past Medical History:  Diagnosis Date   Cancer ([place]) 03/2017   left breast cancer   Diabetes mellitus without complication ([place])    Hyperlipidemia    Hypertension    Malignant neoplasm of left breast ([place])    Tibial plateau fracture, left    04-13-17 had ORIF    Past Surgical History:  Procedure Laterality Date   MASTECTOMY Left 2019   MASTECTOMY MODIFIED RADICAL Left 11/20/2017   Procedure: LEFT MODIFIED RADICAL  MASTECTOMY;  Surgeon: Fanny Skates, MD;  Location: [facility];  Service: General;  Laterality: Left;   ORIF TIBIA PLATEAU Left 04/13/2017   Procedure: OPEN REDUCTION INTERNAL FIXATION (ORIF) TIBIAL PLATEAU;  Surgeon: Shona Needles, MD;  Location: [facility];  Service: Orthopedics;  Laterality: Left;   PORT-A-CATH REMOVAL Right 11/20/2017   Procedure: REMOVAL PORT-A-CATH;  Surgeon: Fanny Skates, MD;  Location: [facility];  Service: General;  Laterality: Right;   PORTACATH PLACEMENT Right 05/31/2017   Procedure: INSERTION PORT-A-CATH;  Surgeon: Fanny Skates, MD;  Location: [facility];  Service: General;  Laterality: Right;    Family History  Problem Relation Age of Onset   Diabetes Son    Breast cancer Neg Hx     Social History Reviewed with no changes to be made today.   Outpatient Medications Prior to Visit  Medication Sig Dispense Refill   ACCU-CHEK FASTCLIX LANCETS MISC Use as instructed. 100 each 12   Blood Glucose Monitoring Suppl (ACCU-CHEK GUIDE ME) w/Device KIT 1 kit by Does not apply route daily. 1 kit 0   glucose blood (ACCU-CHEK GUIDE) test strip Use as instructed 100 each 11   hydrochlorothiazide (HYDRODIURIL) 25 MG tablet Take 1 tablet (25 mg total) by mouth daily. 90 tablet 3   Insulin Pen Needle (B-D UF III MINI PEN NEEDLES) 31G X 5 MM MISC Use as instructed. Check blood glucose levels twice per day. 90 each 1   lidocaine-prilocaine (EMLA) cream Apply to affected area once 30 g 3   losartan (COZAAR) 50 MG tablet Take 1 tablet (50 mg total) by mouth daily. 90 tablet 3   prochlorperazine (COMPAZINE) 10 MG tablet Take 1 tablet (10 mg total) by mouth every 6 (six) hours as needed (Nausea or vomiting). 30 tablet 1   senna (SENOKOT) 8.6 MG TABS tablet Take 1 tablet (8.6 mg total) by mouth daily as needed for mild constipation. 10 each 0   atorvastatin (LIPITOR) 40 MG tablet Take 1 tablet (40 mg total) by mouth daily. 90 tablet 2   fenofibrate (TRICOR) 48  MG tablet Take 1 tablet (48 mg total) by mouth daily. 90 tablet 0   glimepiride (AMARYL) 4 MG tablet Take 2 tablets (8 mg total) by mouth daily before breakfast. 180 tablet 0   potassium chloride SA (K-DUR,KLOR-CON) 20 MEQ tablet Take 1 tablet (20 mEq total) by mouth daily. 20 tablet 0   sitaGLIPtin-metformin (JANUMET) 50-1000 MG tablet Take 1 tablet by mouth 2 (two) times daily with a meal. 60 tablet 2   No facility-administered medications prior to visit.     Allergies  Allergen Reactions   Victoza [Liraglutide] Nausea And Vomiting       Objective:    Ht 4' 11"  (1.499 m)    Wt 123 lb (55.8 kg)    BMI 24.84 kg/m  Wt Readings from Last 3 Encounters:  10/19/18 123 lb (55.8 kg)  08/06/18 125 lb 1.6 oz (56.7 kg)  06/27/18  124 lb (56.2 kg)    Physical Exam Constitutional:      Appearance: Normal appearance.  Cardiovascular:     Rate and Rhythm: Normal rate and regular rhythm.  Pulmonary:     Effort: Pulmonary effort is normal.  Musculoskeletal: Normal range of motion.  Neurological:     General: No focal deficit present.     Mental Status: She is alert and oriented to person, place, and time. Mental status is at baseline.        Patient has been counseled extensively about nutrition and exercise as well as the importance of adherence with medications and regular follow-up. The patient was given clear instructions to go to ER or return to medical center if symptoms don't improve, worsen or new problems develop. The patient verbalized understanding.   Follow-up: Return in about 3 months (around 01/18/2019) for DM.   [name], FNP-BC [facility] [place], [place]   10/19/2018, 1:18 PM

## 2018-11-02 ENCOUNTER — Encounter: Payer: Self-pay | Admitting: Pharmacist

## 2018-11-02 ENCOUNTER — Other Ambulatory Visit: Payer: Self-pay

## 2018-11-02 ENCOUNTER — Ambulatory Visit: Payer: Medicaid Other | Attending: Family Medicine | Admitting: Pharmacist

## 2018-11-02 DIAGNOSIS — Z7189 Other specified counseling: Secondary | ICD-10-CM | POA: Diagnosis not present

## 2018-11-02 NOTE — Progress Notes (Signed)
Patient was educated on the use of the Accu Chek Guide blood glucose meter. Reviewed necessary supplies and operation of the meter. Also reviewed goal blood glucose levels. Patient was able to demonstrate use. All questions and concerns were addressed. Patient has not been checking but after teaching, is amenable to do so. I will follow-up with her in 1 month. We checked her BG with her meter which was resulted at 92.

## 2018-11-03 DIAGNOSIS — Z1211 Encounter for screening for malignant neoplasm of colon: Secondary | ICD-10-CM | POA: Diagnosis not present

## 2018-11-05 ENCOUNTER — Inpatient Hospital Stay: Payer: Medicaid Other | Attending: Hematology | Admitting: Nurse Practitioner

## 2018-11-05 ENCOUNTER — Other Ambulatory Visit: Payer: Self-pay

## 2018-11-05 ENCOUNTER — Inpatient Hospital Stay: Payer: Medicaid Other

## 2018-11-05 VITALS — BP 130/50 | HR 63 | Temp 98.0°F | Resp 17 | Ht 59.0 in | Wt 123.6 lb

## 2018-11-05 DIAGNOSIS — M546 Pain in thoracic spine: Secondary | ICD-10-CM

## 2018-11-05 DIAGNOSIS — Z170 Estrogen receptor positive status [ER+]: Secondary | ICD-10-CM

## 2018-11-05 DIAGNOSIS — C50212 Malignant neoplasm of upper-inner quadrant of left female breast: Secondary | ICD-10-CM

## 2018-11-05 DIAGNOSIS — Z23 Encounter for immunization: Secondary | ICD-10-CM

## 2018-11-05 DIAGNOSIS — Z853 Personal history of malignant neoplasm of breast: Secondary | ICD-10-CM | POA: Insufficient documentation

## 2018-11-05 LAB — CMP (CANCER CENTER ONLY)
ALT: 17 U/L (ref 0–44)
AST: 22 U/L (ref 15–41)
Albumin: 4.3 g/dL (ref 3.5–5.0)
Alkaline Phosphatase: 128 U/L — ABNORMAL HIGH (ref 38–126)
Anion gap: 8 (ref 5–15)
BUN: 15 mg/dL (ref 8–23)
CO2: 28 mmol/L (ref 22–32)
Calcium: 9.9 mg/dL (ref 8.9–10.3)
Chloride: 102 mmol/L (ref 98–111)
Creatinine: 0.7 mg/dL (ref 0.44–1.00)
GFR, Est AFR Am: >60 mL/min (ref >60)
GFR, Estimated: >60 mL/min (ref >60)
Glucose, Bld: 137 mg/dL — ABNORMAL HIGH (ref 70–99)
Potassium: 4.4 mmol/L (ref 3.5–5.1)
Sodium: 138 mmol/L (ref 135–145)
Total Bilirubin: 0.3 mg/dL (ref 0.3–1.2)
Total Protein: 9 g/dL — ABNORMAL HIGH (ref 6.5–8.1)

## 2018-11-05 LAB — CBC WITH DIFFERENTIAL (CANCER CENTER ONLY)
Abs Immature Granulocytes: 0.02 10*3/uL (ref 0.00–0.07)
Basophils Absolute: 0 10*3/uL (ref 0.0–0.1)
Basophils Relative: 0 %
Eosinophils Absolute: 2.6 10*3/uL — ABNORMAL HIGH (ref 0.0–0.5)
Eosinophils Relative: 29 %
HCT: 43 % (ref 36.0–46.0)
Hemoglobin: 14.3 g/dL (ref 12.0–15.0)
Immature Granulocytes: 0 %
Lymphocytes Relative: 26 %
Lymphs Abs: 2.4 10*3/uL (ref 0.7–4.0)
MCH: 32.8 pg (ref 26.0–34.0)
MCHC: 33.3 g/dL (ref 30.0–36.0)
MCV: 98.6 fL (ref 80.0–100.0)
Monocytes Absolute: 0.5 10*3/uL (ref 0.1–1.0)
Monocytes Relative: 6 %
Neutro Abs: 3.5 10*3/uL (ref 1.7–7.7)
Neutrophils Relative %: 39 %
Platelet Count: 195 10*3/uL (ref 150–400)
RBC: 4.36 MIL/uL (ref 3.87–5.11)
RDW: 13.4 % (ref 11.5–15.5)
WBC Count: 9.1 10*3/uL (ref 4.0–10.5)
nRBC: 0 % (ref 0.0–0.2)

## 2018-11-05 LAB — FECAL OCCULT BLOOD, IMMUNOCHEMICAL: Fecal Occult Bld: NEGATIVE

## 2018-11-05 MED ORDER — GABAPENTIN 100 MG PO CAPS: 100.0000 mg | ORAL_CAPSULE | Freq: Every day | ORAL | 1 refills | Status: DC

## 2018-11-05 MED ORDER — INFLUENZA VAC SPLIT QUAD 0.5 ML IM SUSY
0.5000 mL | PREFILLED_SYRINGE | Freq: Once | INTRAMUSCULAR | Status: AC
  Administered 2018-11-05: 0.5 mL via INTRAMUSCULAR

## 2018-11-05 MED ORDER — INFLUENZA VAC SPLIT QUAD 0.5 ML IM SUSY
PREFILLED_SYRINGE | INTRAMUSCULAR | Status: AC
  Filled 2018-11-05: qty 0.5

## 2018-11-05 NOTE — Progress Notes (Signed)
CLINIC:  Survivorship   REASON FOR VISIT:  Routine follow-up post-treatment for a recent history of breast cancer.   Patient Care Team: [name], NP as PCP - General (Nurse Practitioner) [name], MD as Consulting Physician (Hematology) Fanny Skates, MD as Consulting Physician (General Surgery) Alla Feeling, NP as Nurse Practitioner (Nurse Practitioner) Gery Pray, MD as Consulting Physician (Radiation Oncology)   Date of Service: 11/05/2018  BRIEF ONCOLOGIC HISTORY:  Oncology History Overview Note  Cancer Staging Malignant neoplasm of upper-inner quadrant of left breast in female, estrogen receptor positive ([place]) Staging form: Breast, AJCC 8th Edition - Clinical stage from 05/01/2017: Stage Unknown (cTX, cN2, cM0, G3, ER-, PR-, HER2-) - Signed by [name], MD on 11/01/2017 - Pathologic stage from 11/20/2017: No Stage Recommended (ypT0, pN58m, cM0, GX, ER-, PR-, HER2-) - Signed by [name] on 12/10/2017     Malignant neoplasm of upper-inner quadrant of left breast in female, estrogen receptor positive ([place]  04/28/2017 Mammogram   IMPRESSION: Two adjacent masses/enlarged lymph nodes in the LOWER LEFT axilla, the largest measuring 2.1 cm. Tissue sampling of 1 of these is recommended to exclude malignancy/lymphoma. No mammographic evidence of breast malignancy bilaterally.    05/01/2017 Initial Biopsy   Diagnosis 05/01/17 Lymph node, needle/core biopsy, low left inferior axillary - METASTATIC POORLY DIFFERENTIATED CARCINOMA TO A LYMPH NODE. SEE NOTE.   05/01/2017 Cancer Staging   Staging form: Breast, AJCC 8th Edition - Clinical stage from 05/01/2017: Stage Unknown (cTX, cN1, cM0, G3, ER-, PR-, HER2-) - Signed by [name] on 06/02/2017    05/01/2017 Receptors her2   Lymph Node Biopsy:  HER2-Negative  PR-Negative  ER- Negative    05/10/2017 Imaging   MR Breast W WO Contrast 05/10/17 IMPRESSION: No MRI evidence of malignancy in the right breast. Area of  week stippled non mass enhancement in the left breast upper inner quadrant. Separate area of thin linear non mass enhancement in the subareolar left breast. Three grossly abnormal left axillary lymph nodes, and more than 4 less than 1 cm indeterminate left axillary lymph nodes. No evidence of right axillary lymphadenopathy.   05/17/2017 Initial Biopsy   Diagnosis 05/17/17 1. 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 2. Breast, left, needle core biopsy, upper inner post MR enhancement - MICROSCOPIC FOCUS OF DUCTAL CARCINOMA IN SITU.   05/17/2017 Receptors her2   Left Breast Biopsy:  ER-Negative PR-Negative HER2-Negative   05/22/2017 Initial Diagnosis   Malignant neoplasm of upper-inner quadrant of left breast in female, estrogen receptor positive ([place]   06/01/2017 Imaging   [place] Scan 06/01/17 IMPRESSION: No definite scintigraphic evidence of osseous metastatic disease.  Posttraumatic and postsurgical uptake at the LEFT knee.  Nonspecific soft tissue distribution of tracer at the LEFT thigh, could represent contusion, hemorrhage, soft tissue edema, or soft tissue calcifications such as from heterotopic calcification and myositis ossificans; recommend clinical correlation and consider dedicated LEFT femoral radiographs.  Single focus of nonspecific increased tracer localization at the lateral LEFT orbit.    06/01/2017 Imaging   06/01/2017 Bone Scan IMPRESSION: No definite scintigraphic evidence of osseous metastatic disease.  Posttraumatic and postsurgical uptake at the LEFT knee.  Nonspecific soft tissue distribution of tracer at the LEFT thigh, could represent contusion, hemorrhage, soft tissue edema, or soft tissue calcifications such as from heterotopic calcification and myositis ossificans; recommend clinical correlation and consider dedicated LEFT femoral radiographs.  Single  focus  of nonspecific increased tracer localization at the lateral LEFT orbit.   06/09/2017 -  Chemotherapy   ddAC every 2 weeks for 4 weeks starting 06/09/17-07/21/17 followed by weekly [place] and taxol for 12 weeks 08/04/17-10/20/17.    10/24/2017 Imaging   Breast MRI B/l 10/24/17 IMPRESSION: 1. No residual enhancement in the LEFT breast following neoadjuvant treatment. 2. Significantly smaller LEFT axillary lymph nodes, largest now measuring 1.4 centimeters. The remainder of LEFT axillary lymph nodes demonstrate normal fatty hila. RECOMMENDATION: Treatment plan for known LEFT breast cancer.   11/20/2017 Cancer Staging   Staging form: Breast, AJCC 8th Edition - Pathologic stage from 11/20/2017: No Stage Recommended (ypT0, pN17m, cM0, GX, ER-, PR-, HER2-) - Signed by [name] on 12/10/2017   11/20/2017 Surgery   Left mastectomy and SLN biopsy by Dr. IDalbert Batman   11/20/2017 Pathology Results   Breast, modified radical mastectomy , left - MICROSCOPIC FOCI OF RESIDUAL METASTATIC CARCINOMA INVOLVING TWO OF EIGHT LYMPH NODES (2/8); LARGEST CONTINUOUS FOCUS MEASURES 0.1 CM. - NO EVIDENCE OF RESIDUAL CARCINOMA IN THE MASTECTOMY SPECIMEN - MARKED THERAPY-RELATED CHANGES, INCLUDING DENSE HYALIN FIBROSIS     11/20/2017 Receptors her2   ER- PR- HER2- (IHC 1+)   01/02/2018 - 02/14/2018 Radiation Therapy   Adjuvant Radiation 01/02/18 - 02/14/18   07/31/2018 Imaging   Baseline DEXA 07/31/18  ASSESSMENT: The BMD measured at AP Spine L1-L4 is 0.973 g/cm2 with a T-score of -1.7.   This patient is considered OSTEOPENIC according to [facility] criteria. The scan quality is good.   Site Region Measured Date Measured Age YA T-score BMD Significant CHANGE   AP Spine  L1-L4      07/31/2018    63.1         -1.7    0.973 g/cm2   DualFemur Neck Left  07/31/2018    63.1         -1.5    0.828 g/cm2   DualFemur Total Mean 07/31/2018    63.1         0.1     1.016 g/cm2     11/05/2018 Survivorship   Per Amanda Rue NP      INTERVAL HISTORY:  [facility] to review her survivorship care plan detailing her treatment course for breast cancer, as well as monitoring long-term side effects of that treatment, education regarding health maintenance, screening, and overall wellness and health promotion.     Overall, Amanda Duarte reports feeling well overall. She continues to have numbness in fingertips and toes. She can function well for the most part but has difficulty with fine movements such as threading a needle. She feels her hands "don't respond as quickly." She completed PT but was only approved for 3 sessions. Her left arm ROM improved but still has difficulty reaching during shower. Left axilla feels tight and chest wall is tender at times. Denies new mass or nipple change. She developed moderate upper to middle back pain last week. She is not very active but not sedentary. She does not recall straining a muscle. Pain is intermittent, rates is 4/10 today. Takes tylenol with some improvement but then comes back. Otherwise, she denies change in appetite or bowel function, n/v, fever, chills, cough, chest pain, dyspnea, or abdominal pain. She continues f/u with PCP.   ONCOLOGY TREATMENT TEAM:  1. [facility] 2. Medical Oncologist: Dr. FBurr Medico 3. Radiation Oncologist:  Dr. [name] Come    PAST MEDICAL/SURGICAL HISTORY:  Past Medical History:  Diagnosis Date   Cancer ([place]) 03/2017   left breast cancer   Diabetes mellitus without complication ([place])    Hyperlipidemia    Hypertension    Malignant neoplasm of left breast ([place])    Tibial plateau fracture, left    04-13-17 had ORIF   Past Surgical History:  Procedure Laterality Date   MASTECTOMY Left 2019   MASTECTOMY MODIFIED RADICAL Left 11/20/2017   Procedure: LEFT MODIFIED RADICAL MASTECTOMY;   Surgeon: Fanny Skates, MD;  Location: [facility];  Service: General;  Laterality: Left;   ORIF TIBIA PLATEAU Left 04/13/2017   Procedure: OPEN REDUCTION INTERNAL FIXATION (ORIF) TIBIAL PLATEAU;  Surgeon: Shona Needles, MD;  Location: [facility];  Service: Orthopedics;  Laterality: Left;   PORT-A-CATH REMOVAL Right 11/20/2017   Procedure: REMOVAL PORT-A-CATH;  Surgeon: Fanny Skates, MD;  Location: [place] [facility];  Service: General;  Laterality: Right;   PORTACATH PLACEMENT Right 05/31/2017   Procedure: INSERTION PORT-A-CATH;  Surgeon: Fanny Skates, MD;  Location: [facility];  Service: General;  Laterality: Right;     ALLERGIES:  Allergies  Allergen Reactions   Victoza [Liraglutide] Nausea And Vomiting     CURRENT MEDICATIONS:  Outpatient Encounter Medications as of 11/05/2018  Medication Sig   ACCU-CHEK FASTCLIX LANCETS MISC Use as instructed.   atorvastatin (LIPITOR) 40 MG tablet Take 1 tablet (40 mg total) by mouth daily.   Blood Glucose Monitoring Suppl (ACCU-CHEK GUIDE ME) w/Device KIT 1 kit by Does not apply route daily.   empagliflozin (JARDIANCE) 25 MG TABS tablet Take 25 mg by mouth daily before breakfast.   fenofibrate (TRICOR) 48 MG tablet Take 1 tablet (48 mg total) by mouth daily.   gabapentin (NEURONTIN) 100 MG capsule Take 1 capsule (100 mg total) by mouth at bedtime. May gradually increase to 3 capsules at night   glimepiride (AMARYL) 4 MG tablet Take 2 tablets (8 mg total) by mouth daily before breakfast.   glucose blood (ACCU-CHEK GUIDE) test strip Use as instructed   hydrochlorothiazide (HYDRODIURIL) 25 MG tablet Take 1 tablet (25 mg total) by mouth daily.   Insulin Pen Needle (B-D UF III MINI PEN NEEDLES) 31G X 5 MM MISC Use as instructed. Check blood glucose levels twice per day.   lidocaine-prilocaine (EMLA) cream Apply to affected area once   losartan (COZAAR) 50 MG tablet Take 1 tablet (50 mg total) by mouth daily.   prochlorperazine  (COMPAZINE) 10 MG tablet Take 1 tablet (10 mg total) by mouth every 6 (six) hours as needed (Nausea or vomiting).   senna (SENOKOT) 8.6 MG TABS tablet Take 1 tablet (8.6 mg total) by mouth daily as needed for mild constipation.   sitaGLIPtin-metformin (JANUMET) 50-1000 MG tablet Take 1 tablet by mouth 2 (two) times daily with a meal.   [EXPIRED] influenza vac split quadrivalent PF (FLUARIX) injection 0.5 mL    No facility-administered encounter medications on file as of 11/05/2018.      ONCOLOGIC FAMILY HISTORY:  Family History  Problem Relation Age of Onset   Diabetes Son    Breast cancer Neg Hx      GENETIC COUNSELING/TESTING: No  SOCIAL HISTORY:  Amanda Duarte is single and lives with her 55 year old grandson in [place], [place].    PHYSICAL EXAMINATION:  Vital Signs:   Vitals:   11/05/18 0940  BP: (!) 130/50  Pulse: 63  Resp: 17  Temp: 98 F (36.7  C)  SpO2: 97%   Filed Weights   11/05/18 0940  Weight: 123 lb 9.6 oz (56.1 kg)   General: Well-nourished, well-appearing female in no acute distress.   HEENT: Sclerae anicteric.   Lymph: No cervical, supraclavicular, or infraclavicular lymphadenopathy noted on palpation.  Cardiovascular: Regular rate and rhythm.[name] Respiratory: Clear to auscultation bilaterally. Breathing non-labored.  GI: Abdomen soft and round; non-tende  Neuro: Steady gait.  Psych: Mood and affect normal and appropriate for situation.  Extremities: No edema. MSK: mild tenderness to thoracic spine. No palpable abnormality, no bruise. Slightly limited LUE range of motion Skin: warm and dry Previous PAC site, incision healed Breast exam: s/p left mastectomy, no palpable mass or nodularity along the incision or chest wall. Hyperpigmentation noted to left chest. No palpable mass in right breast or either axilla that I could appreciate    LABORATORY DATA:  CBC Latest Ref Rng & Units 11/05/2018 08/06/2018 02/16/2018  WBC 4.0 - 10.5  K/uL 9.1 9.2 7.0  Hemoglobin 12.0 - 15.0 g/dL 14.3 13.8 13.3  Hematocrit 36.0 - 46.0 % 43.0 41.3 39.9  Platelets 150 - 400 K/uL 195 155 160   CMP Latest Ref Rng & Units 11/05/2018 08/06/2018 02/16/2018  Glucose 70 - 99 mg/dL 137(H) 203(H) 234(H)  BUN 8 - 23 mg/dL 15 14 7(L)  Creatinine 0.44 - 1.00 mg/dL 0.70 0.73 0.72  Sodium 135 - 145 mmol/L 138 139 137  Potassium 3.5 - 5.1 mmol/L 4.4 4.1 4.2  Chloride 98 - 111 mmol/L 102 104 99  CO2 22 - 32 mmol/L _0 Calcium 8.9 - 10.3 mg/dL 9.9 9.7 10.0  Total Protein 6.5 - 8.1 g/dL 9.0(H) 8.5(H) 8.4(H)  Total Bilirubin 0.3 - 1.2 mg/dL 0.3 0.5 0.4  Alkaline Phos 38 - 126 U/L 128(H) 113 98  AST 15 - 41 U/L 22 25 68(H)  ALT 0 - 44 U/L 17 21 41    DIAGNOSTIC IMAGING:  None for this visit.      ASSESSMENT AND PLAN:  Amanda Duarte is a pleasant 63 y.o. 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  She presents to the Survivorship Clinic for our initial meeting and routine follow-up post-completion of treatment for breast cancer.    1. [id number] left breast cancer:  Ms. HRenstromis continuing to recover from definitive treatment for breast cancer. She continues to have tightness and reduced ROM in her left upper arm that improved with PT; however, she states she only qualified for 4 sessions. She is interested in more therapy if she is able, and I agree she would benefit from this; I will reach out to PT today. Radiation-related skin changes are improving. She will continue follow-up with her medical oncologist, Dr. FBurr Medicoin 3 months with history and physical exam per surveillance protocol.  Due to her triple negative disease, she is not on anti-estrogen therapy. Her breast exam was unremarkable today, no concern for local recurrence. She will continue breast cancer surveillance.  She had left mastectomy; no role for bilateral mammo but will continue right screening mammogram, last done in 07/2018 which was negative. Her mammogram at diagnosis did not show a left breast mass, she may benefit from screening MRI which is more sensitive in the future. She has breast density category C.   Today, a comprehensive survivorship care plan and treatment summary was reviewed with the patient today detailing  her breast cancer diagnosis, treatment course, potential late/long-term effects of treatment, appropriate follow-up care with recommendations for the future, and patient education resources.  A copy of this summary, along with a letter will be sent to the patients primary care provider via In Basket message after todays visit.    2. Peripheral neuropathy, secondary to chemotherapy She is able to function well but has difficulty with fine motor movement such as threading a needle. She feels her hands have a delayed response time. I recommend to begin gabapentin 100 mg qHS. She can gradually increase to 300 mg qHS if needed. Will monitor closely.  3. Back pain: She developed moderate pain in middle/upper back a week ago. She is slightly tender on exam. She does not recall and injury or strain or inciting event. She thought it was the weather. She is not very active but not sedentary. Alk phos is slightly elevated. CA 27.29 trending up but still in normal range. While this could certainly represent a benign musculoskeletal etiology such as arthritis or potentially related to her osteopenia, given her triple negative grade 3 cancer, I will obtain MRI to r/o malignancy.  4. Bone health:  Given Amanda Duarte age/history of breast cancer and history of chemotherapy, she is at risk for bone demineralization.  Her last DEXA scan was 07/31/2018, which showed osteopenia at the AP spine, T score -1.8. I encouraged her to take oral calcium and vitamin D supplement as well as increase her  weight-bearing activities.  She was given education on specific activities to promote bone health.  5. Cancer screening:  Due to Amanda Duarte history and her age, she should receive screening for skin cancers, colon cancer, and gynecologic cancers.  She is reportedly UTD on PAP test which was negative recently at [facility]. She has never had screening colonoscopy. I reviewed this is the gold standard for early colon cancer detection. She agrees to GI referral today. The information and recommendations are listed on the patient's comprehensive care plan/treatment summary and were reviewed in detail with the patient.    6. Health maintenance and wellness promotion: Amanda Duarte was encouraged to consume 5-7 servings of fruits and vegetables per day. We reviewed the "Nutrition Rainbow" handout, as well as the handout "Take Control of Your Health and Reduce Your Cancer Risk" from the [employer].  She was also encouraged to engage in moderate to vigorous exercise for 30 minutes per day most days of the week. We discussed the [employer] fitness program, which is designed for cancer survivors to help them become more physically fit after cancer treatments.  She was instructed to limit her alcohol consumption and continue to abstain from tobacco use.     7. Support services/counseling: It is not uncommon for this period of the patient's cancer care trajectory to be one of many emotions and stressors.  We discussed an opportunity for her to participate in the next session of [facility] ("Finding Your New Normal") support group series designed for patients after they have completed treatment.   Amanda Duarte was encouraged to take advantage of our many other support services programs, support groups, and/or counseling in coping with her new life as a cancer survivor after completing anti-cancer treatment.  She was offered support today through active listening and  expressive supportive counseling.  She was given information regarding our available services and encouraged to contact me with any questions or for help enrolling in any of our support group/programs.  Dispo:   -Return to cancer center in 3 months, sooner if MRI is abnormal -Lab today, including CA 27.29  -thoracic MRI in next week -Message to PT re: additional sessions; will need Medicaid authorization which has been requested per PT note on 09/18/18 -Rx: gabapentin qHS for neuropathy  -Right side screening mammogram due in 07/2019 -Flu vaccine today -Referral to GI to discuss first screening colonoscopy  -Follow up with surgery as needed  -She is welcome to return back to the Survivorship Clinic at any time; no additional follow-up needed at this time.  -Consider referral back to survivorship as a long-term survivor for continued surveillance  Orders Placed This Encounter  Procedures   MR THORACIC SPINE W WO CONTRAST    Standing Status:   Future    Standing Expiration Date:   01/06/2020    Order Specific Question:   ** REASON FOR EXAM (FREE TEXT)    Answer:   h/o locally advanced triple negative G3 breast caner, new onset back pain    Order Specific Question:   GRA to provide read?    Answer:   Yes    Order Specific Question:   If indicated for the ordered procedure, I authorize the administration of contrast media per Radiology protocol    Answer:   Yes    Order Specific Question:   What is the patient's sedation requirement?    Answer:   No Sedation    Order Specific Question:   Use SRS Protocol?    Answer:   No    Order Specific Question:   Does the patient have a pacemaker or implanted devices?    Answer:   No    Order Specific Question:   Preferred imaging location?    Answer:   [facility] (table limit-350 lbs)    Order Specific Question:   Radiology Contrast Protocol - do NOT remove file path    Answer:   [email or web address]   CA  27.29    Standing Status:   Standing    Number of Occurrences:   1    Standing Expiration Date:   11/05/2019   Ambulatory referral to Gastroenterology    Referral Priority:   Routine    Referral Type:   Consultation    Referral Reason:   Specialty Services Required    Number of Visits Requested:   1    A total of (60) minutes of face-to-face time was spent with this patient with greater than 50% of that time in counseling and care-coordination. A video-assisted Spanish interpreter was used for the entirety of today's visit.   [name], NP Survivorship Program [facility] [phone]   Note: PRIMARY CARE PROVIDER [name], [place] [phone]

## 2018-11-06 ENCOUNTER — Encounter: Payer: Self-pay | Admitting: Nurse Practitioner

## 2018-11-06 ENCOUNTER — Telehealth: Payer: Self-pay | Admitting: Nurse Practitioner

## 2018-11-06 LAB — CANCER ANTIGEN 27.29: CA 27.29: 21.3 U/mL (ref 0.0–38.6)

## 2018-11-06 NOTE — Telephone Encounter (Signed)
Scheduled appt per 10/12 los. ° °Sent a staff message to get a calendar mailed out. °

## 2018-11-07 ENCOUNTER — Telehealth: Payer: Self-pay

## 2018-11-07 NOTE — Telephone Encounter (Signed)
Faxed dental clearance to Dr. Graciella Freer at [phone] and copy to Dr. Dalbert Batman at [phone], sent to HIM for scanning to chart.

## 2018-11-14 ENCOUNTER — Ambulatory Visit (HOSPITAL_COMMUNITY)
Admission: RE | Admit: 2018-11-14 | Discharge: 2018-11-14 | Disposition: A | Discharge status: Home / Self Care | Payer: Medicaid Other | Source: Ambulatory Visit | Attending: Nurse Practitioner | Admitting: Nurse Practitioner

## 2018-11-14 DIAGNOSIS — M546 Pain in thoracic spine: Secondary | ICD-10-CM | POA: Diagnosis not present

## 2018-11-14 DIAGNOSIS — M5384 Other specified dorsopathies, thoracic region: Secondary | ICD-10-CM | POA: Diagnosis not present

## 2018-11-14 MED ORDER — GADOBUTROL 1 MMOL/ML IV SOLN
6.0000 mL | Freq: Once | INTRAVENOUS | Status: AC | PRN
  Administered 2018-11-14: 14:00:00 6 mL via INTRAVENOUS

## 2018-11-15 ENCOUNTER — Encounter: Payer: Self-pay | Admitting: Physical Therapy

## 2018-11-15 ENCOUNTER — Ambulatory Visit: Payer: Medicaid Other | Attending: Hematology | Admitting: Physical Therapy

## 2018-11-15 ENCOUNTER — Other Ambulatory Visit: Payer: Self-pay

## 2018-11-15 DIAGNOSIS — G8929 Other chronic pain: Secondary | ICD-10-CM | POA: Insufficient documentation

## 2018-11-15 DIAGNOSIS — M25612 Stiffness of left shoulder, not elsewhere classified: Secondary | ICD-10-CM

## 2018-11-15 DIAGNOSIS — R293 Abnormal posture: Secondary | ICD-10-CM | POA: Diagnosis not present

## 2018-11-15 DIAGNOSIS — M25512 Pain in left shoulder: Secondary | ICD-10-CM | POA: Insufficient documentation

## 2018-11-15 NOTE — Therapy (Signed)
[place], [place], [address] Phone: [phone]   Fax:  [phone]  Physical Therapy Treatment  Patient Details  Name: Amanda Duarte MRN: [id number] Date of Birth: 06-11-1955 Referring Provider (PT): [name]   Encounter Date: 11/15/2018  PT End of Session - 11/15/18 1058    Visit Number  5    Number of Visits  15    Date for PT Re-Evaluation  12/13/18    Authorization Type  3 visits until 8/25, authorization requested for 12 more visits from [facility] on 8/25 - new auth for 8 visits from 10/15 to 11/11    Authorization - Visit Number  4    Authorization - Number of Visits  15    PT Start Time  1001    PT Stop Time  1055    PT Time Calculation (min)  54 min    Activity Tolerance  Patient tolerated treatment well    Behavior During Therapy  [facility] for tasks assessed/performed       Past Medical History:  Diagnosis Date  . Cancer ([place]) 03/2017   left breast cancer  . Diabetes mellitus without complication ([place])   . Hyperlipidemia   . Hypertension   . Malignant neoplasm of left breast ([place])   . Tibial plateau fracture, left    04-13-17 had ORIF    Past Surgical History:  Procedure Laterality Date  . MASTECTOMY Left 2019  . MASTECTOMY MODIFIED RADICAL Left 11/20/2017   Procedure: LEFT MODIFIED RADICAL MASTECTOMY;  Surgeon: Fanny Skates, MD;  Location: [place];  Service: General;  Laterality: Left;  . ORIF TIBIA PLATEAU Left 04/13/2017   Procedure: OPEN REDUCTION INTERNAL FIXATION (ORIF) TIBIAL PLATEAU;  Surgeon: Shona Needles, MD;  Location: [place];  Service: Orthopedics;  Laterality: Left;  . PORT-A-CATH REMOVAL Right 11/20/2017   Procedure: REMOVAL PORT-A-CATH;  Surgeon: Fanny Skates, MD;  Location: [place];  Service: General;  Laterality: Right;  . PORTACATH PLACEMENT Right 05/31/2017   Procedure: INSERTION PORT-A-CATH;  Surgeon: Fanny Skates, MD;  Location: [place];   Service: General;  Laterality: Right;    There were no vitals filed for this visit.  Subjective Assessment - 11/15/18 1004    Subjective  My back is hurting less now. Lifting up the arm I can do more but it still hurts down the whole side. I have sharp little pains in the chest.    Patient is accompained by:  Interpreter    Pertinent History  Pt presents s/p Lt modified radical mastectomy on 11/20/17 due to triple negative breast cancer. Radiation completed 01/02/18-02/14/18. Chemotherapy completed neoadjuvantly.  Osteopenia, DM, HTN,    Patient Stated Goals  move the arm more    Currently in Pain?  Yes    Pain Score  5     Pain Location  Chest    Pain Orientation  Left    Pain Descriptors / Indicators  Sharp    Pain Type  Chronic pain    Pain Onset  More than a month ago    Pain Frequency  Constant    Aggravating Factors   nothing    Pain Relieving Factors  pressing on area with hand    Effect of Pain on Daily Activities  does not limit much because does not do much                       [facility] Adult PT Treatment/Exercise -  11/15/18 0001      Manual Therapy   Joint Mobilization  GH AP glides in neutral for shoulder positioning- stopped due to pt's pain    Soft tissue mobilization  to the anterior shoulder, area inferior to mastectomy scar, UT, and into the anterior pectoralis, also in R s/l to L lateral trunk in area of serratus and posterior shoulder where pt felt increased pain    Scapular Mobilization  in R s/l to L scapular into protraction, retraction, and depression with pt extrememly tight and limited in all directions    Passive ROM  to the Lt shoulder to tolerance with moderate guarding, pt very limited in ER, was able to improve flexion and abduction with PROM                  PT Long Term Goals - 11/15/18 1013      PT LONG TERM GOAL #1   Title  Pt will improve Lt shoulder flexion to 145 deg or greater to improve mobility    Baseline  111, on  09/18/2018: 115, 11/15/18- 137    Time  8    Period  Weeks    Status  On-going      PT LONG TERM GOAL #2   Title  pt will improve Lt shoulder abduction to 145 or greater to improve mobility    Baseline  86,on eval, 90 on 8/25, 11/15/18- 80 unable to go further without moving in to abdcution    Time  8    Period  Weeks    Status  On-going      PT LONG TERM GOAL #3   Title  Pt will decrease Lt shoulder pain to 2/10 at the most    Baseline  6/10 on eval, 6/10 on 8/25, 11/15/18- 8/10 when moving arm    Time  8    Period  Weeks    Status  On-going      PT LONG TERM GOAL #4   Title  Pt will have no difficulty getting dressed or undressed due to shoulder pain    Baseline  needs assist to remove shirt on 09/18/2018, 11/15/18- has to use mostly the right arm due to limited ROM but is able to do it herself    Time  8    Period  Weeks    Status  On-going            Plan - 11/15/18 1100    Clinical Impression Statement  Assessed pt's progress towards all goals in therapy today. Her flexion ROM has improved but she remains very limited with abduction and ER. She is unable to obtain true abduction and demonstrates scaption. She has very poor scapular mobility and her shoulder is very anterior due to pec tightness. She has increased muscle tightness and scar tissue across L pec, serratus and posterior shoulder. She would benefit from continued skilled PT visits to address these issues.    PT Frequency  2x / week    PT Duration  4 weeks    PT Treatment/Interventions  ADLs/Self Care Home Management;Manual techniques;Taping;Passive range of motion;Therapeutic exercise    PT Next Visit Plan  , L shoulder mobility and ROM, scapular STM PRN, joint mobs including ribs add active assisted stretching with pulleys, ball on wall,    PT Home Exercise Plan  [id number], wall stretch for shoulder flexion    Consulted and Agree with Plan of Care  Patient       Patient  will benefit from skilled therapeutic  intervention in order to improve the following deficits and impairments:  Increased fascial restricitons, Pain, Decreased range of motion, Impaired UE functional use  Visit Diagnosis: Stiffness of left shoulder, not elsewhere classified  Chronic left shoulder pain  Abnormal posture     Problem List Patient Active Problem List   Diagnosis Date Noted  . Mixed hyperglyceridemia 06/27/2018  . Cancer of overlapping sites of left female breast ([place]) 11/20/2017  . Port-A-Cath in place 06/09/2017  . Malignant neoplasm of upper-inner quadrant of left breast in female, estrogen receptor positive ([place]) 05/22/2017  . Closed fracture of left tibial plateau   . Axillary lymphadenopathy 04/12/2017  . Closed fracture of lateral portion of left tibial plateau 04/11/2017  . Chest pain, neg MI, neg stress Nuc study, possible GI 05/19/2014  . Essential hypertension 05/19/2014  . Type 2 diabetes mellitus with complication, without long-term current use of insulin ([place]) 05/19/2014    Allyson Sabal [facility] 11/15/2018, 11:03 AM  [place], [place], [address] Phone: [phone]   Fax:  [phone]  Name: Amanda Duarte MRN: [id number] Date of Birth: 07/14/1955  Manus Gunning, PT 11/15/18 11:03 AM

## 2018-11-21 ENCOUNTER — Ambulatory Visit: Payer: Medicaid Other

## 2018-11-22 ENCOUNTER — Other Ambulatory Visit: Payer: Self-pay

## 2018-11-22 ENCOUNTER — Ambulatory Visit: Payer: Medicaid Other

## 2018-11-22 DIAGNOSIS — G8929 Other chronic pain: Secondary | ICD-10-CM | POA: Diagnosis not present

## 2018-11-22 DIAGNOSIS — M25612 Stiffness of left shoulder, not elsewhere classified: Secondary | ICD-10-CM

## 2018-11-22 DIAGNOSIS — R293 Abnormal posture: Secondary | ICD-10-CM | POA: Diagnosis not present

## 2018-11-22 DIAGNOSIS — M25512 Pain in left shoulder: Secondary | ICD-10-CM

## 2018-11-22 NOTE — Therapy (Signed)
[place], [place], [address] Phone: [phone]   Fax:  [phone]  Physical Therapy Treatment  Patient Details  Name: Amanda Duarte MRN: [id number] Date of Birth: 03/02/1955 Referring Provider (PT): [name]   Encounter Date: 11/22/2018  PT End of Session - 11/22/18 1306    Visit Number  6    Number of Visits  15    Date for PT Re-Evaluation  12/13/18    Authorization Type  3 visits until 8/25, authorization requested for 12 more visits from [facility] on 8/25 - new auth for 8 visits from 10/15 to 11/11    Authorization - Visit Number  5    Authorization - Number of Visits  15    PT Start Time  [id number]   pt arrived late   PT Stop Time  0949   power outage at clinic and unable to treat pt longer   PT Time Calculation (min)  35 min    Activity Tolerance  Patient tolerated treatment well    Behavior During Therapy  [facility] for tasks assessed/performed       Past Medical History:  Diagnosis Date  . Cancer ([place]) 03/2017   left breast cancer  . Diabetes mellitus without complication ([place])   . Hyperlipidemia   . Hypertension   . Malignant neoplasm of left breast ([place])   . Tibial plateau fracture, left    04-13-17 had ORIF    Past Surgical History:  Procedure Laterality Date  . MASTECTOMY Left 2019  . MASTECTOMY MODIFIED RADICAL Left 11/20/2017   Procedure: LEFT MODIFIED RADICAL MASTECTOMY;  Surgeon: Fanny Skates, MD;  Location: [place];  Service: General;  Laterality: Left;  . ORIF TIBIA PLATEAU Left 04/13/2017   Procedure: OPEN REDUCTION INTERNAL FIXATION (ORIF) TIBIAL PLATEAU;  Surgeon: Shona Needles, MD;  Location: [place];  Service: Orthopedics;  Laterality: Left;  . PORT-A-CATH REMOVAL Right 11/20/2017   Procedure: REMOVAL PORT-A-CATH;  Surgeon: Fanny Skates, MD;  Location: [place];  Service: General;  Laterality: Right;  . PORTACATH PLACEMENT Right 05/31/2017   Procedure: INSERTION  PORT-A-CATH;  Surgeon: Fanny Skates, MD;  Location: [place];  Service: General;  Laterality: Right;    There were no vitals filed for this visit.  Subjective Assessment - 11/22/18 0917    Subjective  My Lt arm is still asleep (she points to inner upper arm) and tender at the side of my trunk.    Pertinent History  Pt presents s/p Lt modified radical mastectomy on 11/20/17 due to triple negative breast cancer. Radiation completed 01/02/18-02/14/18. Chemotherapy completed neoadjuvantly.  Osteopenia, DM, HTN,    Patient Stated Goals  move the arm more    Currently in Pain?  Yes    Pain Score  4     Pain Location  Chest    Pain Orientation  Left;Lateral    Pain Descriptors / Indicators  Tender;Numbness    Pain Type  Surgical pain    Pain Onset  More than a month ago    Pain Frequency  Constant    Aggravating Factors   moving arms more with ADLs    Pain Relieving Factors  when I come to therapy                       [facility] Adult PT Treatment/Exercise - 11/22/18 0001      Shoulder Exercises: Pulleys   Flexion  2 minutes  Flexion Limitations  Tactile cues to decrease Lt scapular compensation throughout    Scaption  1 minute    Scaption Limitations  Tactile cueing to decrease Lt scapular compensation      Manual Therapy   Joint Mobilization  GH AP glides in neutral for shoulder positioning- pt was able tolerate this without increased pain today    Soft tissue mobilization  to the anterior shoulder, area inferior to mastectomy scar, UT, and into the anterior pectoralis, also in R s/l to L lateral trunk in area of serratus and posterior shoulder where pt felt increased pain but tenderness seemed to be improved from pt report    Scapular Mobilization  in R S/L to Lt scapular into protraction, retraction, and depression, pt seemed to have only min-mod tightness today and did not have c/o discomfort    Passive ROM  to the Lt shoulder to tolerance with moderate  guarding, pt continues to be very limited in ER, was able to improve flexion and abduction with PROM some, but still very limited tolerance, especially with abduction                  PT Long Term Goals - 11/15/18 1013      PT LONG TERM GOAL #1   Title  Pt will improve Lt shoulder flexion to 145 deg or greater to improve mobility    Baseline  111, on 09/18/2018: 115, 11/15/18- 137    Time  8    Period  Weeks    Status  On-going      PT LONG TERM GOAL #2   Title  pt will improve Lt shoulder abduction to 145 or greater to improve mobility    Baseline  86,on eval, 90 on 8/25, 11/15/18- 80 unable to go further without moving in to abdcution    Time  8    Period  Weeks    Status  On-going      PT LONG TERM GOAL #3   Title  Pt will decrease Lt shoulder pain to 2/10 at the most    Baseline  6/10 on eval, 6/10 on 8/25, 11/15/18- 8/10 when moving arm    Time  8    Period  Weeks    Status  On-going      PT LONG TERM GOAL #4   Title  Pt will have no difficulty getting dressed or undressed due to shoulder pain    Baseline  needs assist to remove shirt on 09/18/2018, 11/15/18- has to use mostly the right arm due to limited ROM but is able to do it herself    Time  8    Period  Weeks    Status  On-going            Plan - 11/22/18 1312    Clinical Impression Statement  Pt arrived late for session and then clinic lost power near end of session, so limited time today. Continued with manual therapy working to improve Lt shoulder ROM and decreased tightness OF Lt upper quadrant. Pt requires multiple VCs to relax during stretching and really struggles with this due to increased guarding. Attempted to explain this to pt today for her to work towards relaxing during day and work her HEP stretches into her day. Pt seemed to verbalized understanding this but interpreter not present so unsure if she fully understood. She does report that since start of care her A/ROM is much improved with  less pain and increased function. She  will benefit from furthre therapy to help increase end ROM, which is still very limited.    Personal Factors and Comorbidities  Time since onset of injury/illness/exacerbation;Comorbidity 1    Comorbidities  radiation history    Examination-Activity Limitations  Lift;Reach Overhead;Dressing    Examination-Participation Restrictions  Yard Work;Cleaning;Community Activity;Laundry    Stability/Clinical Decision Making  Stable/Uncomplicated    Rehab Potential  Excellent    PT Frequency  2x / week    PT Duration  4 weeks    PT Treatment/Interventions  ADLs/Self Care Home Management;Manual techniques;Taping;Passive range of motion;Therapeutic exercise    PT Next Visit Plan  Cont Lt shoulder mobility and ROM, scapular STM PRN, joint mobs including ribs, add active assisted stretching, and cont with pulleys, ball on wall    Consulted and Agree with Plan of Care  Patient       Patient will benefit from skilled therapeutic intervention in order to improve the following deficits and impairments:  Increased fascial restricitons, Pain, Decreased range of motion, Impaired UE functional use, Decreased knowledge of precautions  Visit Diagnosis: Stiffness of left shoulder, not elsewhere classified  Chronic left shoulder pain  Abnormal posture     Problem List Patient Active Problem List   Diagnosis Date Noted  . Mixed hyperglyceridemia 06/27/2018  . Cancer of overlapping sites of left female breast ([place]) 11/20/2017  . Port-A-Cath in place 06/09/2017  . Malignant neoplasm of upper-inner quadrant of left breast in female, estrogen receptor positive ([place]) 05/22/2017  . Closed fracture of left tibial plateau   . Axillary lymphadenopathy 04/12/2017  . Closed fracture of lateral portion of left tibial plateau 04/11/2017  . Chest pain, neg MI, neg stress Nuc study, possible GI 05/19/2014  . Essential hypertension 05/19/2014  . Type 2 diabetes mellitus with  complication, without long-term current use of insulin ([place]) 05/19/2014    Otelia Limes, PTA 11/22/2018, 1:59 PM  [place], [place], [address] Phone: [phone]   Fax:  [phone]  Name: Romee Fron MRN: [id number] Date of Birth: Apr 05, 1955

## 2018-11-26 ENCOUNTER — Ambulatory Visit: Payer: Medicaid Other | Attending: Hematology

## 2018-11-26 ENCOUNTER — Other Ambulatory Visit: Payer: Self-pay

## 2018-11-26 DIAGNOSIS — M25612 Stiffness of left shoulder, not elsewhere classified: Secondary | ICD-10-CM | POA: Diagnosis not present

## 2018-11-26 DIAGNOSIS — R293 Abnormal posture: Secondary | ICD-10-CM

## 2018-11-26 DIAGNOSIS — G8929 Other chronic pain: Secondary | ICD-10-CM | POA: Diagnosis not present

## 2018-11-26 DIAGNOSIS — M25512 Pain in left shoulder: Secondary | ICD-10-CM | POA: Insufficient documentation

## 2018-11-26 NOTE — Therapy (Signed)
[place], [place], [address] Phone: [phone]   Fax:  [phone]  Physical Therapy Treatment  Patient Details  Name: Amanda Duarte MRN: [id number] Date of Birth: 04-13-1955 Referring Provider (PT): [name]   Encounter Date: 11/26/2018  PT End of Session - 11/26/18 0959    Visit Number  7    Number of Visits  15    Date for PT Re-Evaluation  12/13/18    Authorization Type  3 visits until 8/25, authorization requested for 12 more visits from [facility] on 8/25 - new auth for 8 visits from 10/15 to 11/11    Authorization - Visit Number  6    Authorization - Number of Visits  15    PT Start Time  0902    PT Stop Time  0959    PT Time Calculation (min)  57 min    Activity Tolerance  Patient tolerated treatment well    Behavior During Therapy  [facility] for tasks assessed/performed       Past Medical History:  Diagnosis Date  . Cancer ([place]) 03/2017   left breast cancer  . Diabetes mellitus without complication ([place])   . Hyperlipidemia   . Hypertension   . Malignant neoplasm of left breast ([place])   . Tibial plateau fracture, left    04-13-17 had ORIF    Past Surgical History:  Procedure Laterality Date  . MASTECTOMY Left 2019  . MASTECTOMY MODIFIED RADICAL Left 11/20/2017   Procedure: LEFT MODIFIED RADICAL MASTECTOMY;  Surgeon: Fanny Skates, MD;  Location: [place];  Service: General;  Laterality: Left;  . ORIF TIBIA PLATEAU Left 04/13/2017   Procedure: OPEN REDUCTION INTERNAL FIXATION (ORIF) TIBIAL PLATEAU;  Surgeon: Shona Needles, MD;  Location: [place];  Service: Orthopedics;  Laterality: Left;  . PORT-A-CATH REMOVAL Right 11/20/2017   Procedure: REMOVAL PORT-A-CATH;  Surgeon: Fanny Skates, MD;  Location: [place];  Service: General;  Laterality: Right;  . PORTACATH PLACEMENT Right 05/31/2017   Procedure: INSERTION PORT-A-CATH;  Surgeon: Fanny Skates, MD;  Location: [place];   Service: General;  Laterality: Right;    There were no vitals filed for this visit.  Subjective Assessment - 11/26/18 0906    Subjective  I felt okay after last session. I've been trying to move my arm more at home and I can reach a little higher now than before with my Lt arm. Right now I just feel really tight at my Lt chest wall and side (points to lateral chest wall).    Pertinent History  Pt presents s/p Lt modified radical mastectomy on 11/20/17 due to triple negative breast cancer. Radiation completed 01/02/18-02/14/18. Chemotherapy completed neoadjuvantly.  Osteopenia, DM, HTN,    Limitations  Lifting    Patient Stated Goals  move the arm more    Currently in Pain?  No/denies   just very tight        [facility] PT Assessment - 11/26/18 0001      AROM   Left Shoulder Flexion  124 Degrees    Left Shoulder ABduction  94 Degrees                   [facility] Adult PT Treatment/Exercise - 11/26/18 0001      Shoulder Exercises: Pulleys   Flexion  2 minutes    Flexion Limitations  Continued with tactile cues to decrease Lt scapular compensation but pt was able to return bettter return of technique today and  VCsto hold stretch for 5 sec    Scaption  2 minutes      Shoulder Exercises: Therapy Ball   Flexion  Both;10 reps   forward lean into end of stretch   Flexion Limitations  Pt returned therapist demo and required multiple tactile and VCs during to not bounce at end of stretch and to cont to work on decreasing Lt scapular compensation      Manual Therapy   Joint Mobilization  GH AP glides in neutral for shoulder positioning- pt was able tolerate this without increased pain today    Soft tissue mobilization  With thick massage cream today to the anterior shoulder, area superior to mastectomy scar, and into the anterior pectoralis/axilla, also in R S/L to L lateral trunk in area of serratus and posterior shoulder where pt felt increased pain but tenderness seemed to be improved  from pt report and some softening noted of very tight tissue here by end of session    Scapular Mobilization  in R S/L to Lt scapular into protraction, retraction, and depression, increased tightness with limited scapular mobility noted initially but this seems to improve well during mobs. Pt will c/o discomfort along medial border initially but this also improved by end of session    Passive ROM  to the Lt shoulder to tolerance with moderate guarding, pt continues to be very limited in ER, was able to improve flexion and abduction with PROM some, but still very limited tolerance, especially with abduction                  PT Long Term Goals - 11/15/18 1013      PT LONG TERM GOAL #1   Title  Pt will improve Lt shoulder flexion to 145 deg or greater to improve mobility    Baseline  111, on 09/18/2018: 115, 11/15/18- 137    Time  8    Period  Weeks    Status  On-going      PT LONG TERM GOAL #2   Title  pt will improve Lt shoulder abduction to 145 or greater to improve mobility    Baseline  86,on eval, 90 on 8/25, 11/15/18- 80 unable to go further without moving in to abdcution    Time  8    Period  Weeks    Status  On-going      PT LONG TERM GOAL #3   Title  Pt will decrease Lt shoulder pain to 2/10 at the most    Baseline  6/10 on eval, 6/10 on 8/25, 11/15/18- 8/10 when moving arm    Time  8    Period  Weeks    Status  On-going      PT LONG TERM GOAL #4   Title  Pt will have no difficulty getting dressed or undressed due to shoulder pain    Baseline  needs assist to remove shirt on 09/18/2018, 11/15/18- has to use mostly the right arm due to limited ROM but is able to do it herself    Time  8    Period  Weeks    Status  On-going            Plan - 11/26/18 1000    Clinical Impression Statement  Pt continues to overall tolerate session well. She is still very limited with end ROM stretching due to fascia and muscular tightness of Lt shoulder capsule and upper  quadrant, but this does improve with each session. Focused on  soft tissue work to anterior chest, axilla and periscapular areas with and without P/ROM. She does stil require multiple VCs to relax during manaul therapy however improvement is very well noted today with pt being able to stay relaxed longer during stretching and relax more effectively (was having trouble completely relaxing first few sessions). Pt reports noticing improvement as well demonstrating at end of session that she can now reach behind her head to wash hair where as before couldn't Lt hand near head.    Personal Factors and Comorbidities  Time since onset of injury/illness/exacerbation;Comorbidity 1    Comorbidities  radiation history    Examination-Activity Limitations  Lift;Reach Overhead;Dressing    Examination-Participation Restrictions  Yard Work;Cleaning;Community Activity;Laundry    Stability/Clinical Decision Making  Stable/Uncomplicated    Rehab Potential  Excellent    PT Frequency  2x / week    PT Duration  4 weeks    PT Treatment/Interventions  ADLs/Self Care Home Management;Manual techniques;Taping;Passive range of motion;Therapeutic exercise    PT Next Visit Plan  Cont Lt shoulder mobility and ROM, scapular STM PRN, joint mobs including ribs, add active assisted stretching, and cont with pulleys, ball on wall    Consulted and Agree with Plan of Care  Patient       Patient will benefit from skilled therapeutic intervention in order to improve the following deficits and impairments:  Increased fascial restricitons, Pain, Decreased range of motion, Impaired UE functional use, Decreased knowledge of precautions  Visit Diagnosis: Stiffness of left shoulder, not elsewhere classified  Chronic left shoulder pain  Abnormal posture     Problem List Patient Active Problem List   Diagnosis Date Noted  . Mixed hyperglyceridemia 06/27/2018  . Cancer of overlapping sites of left female breast ([place]) 11/20/2017  .  Port-A-Cath in place 06/09/2017  . Malignant neoplasm of upper-inner quadrant of left breast in female, estrogen receptor positive ([place]) 05/22/2017  . Closed fracture of left tibial plateau   . Axillary lymphadenopathy 04/12/2017  . Closed fracture of lateral portion of left tibial plateau 04/11/2017  . Chest pain, neg MI, neg stress Nuc study, possible GI 05/19/2014  . Essential hypertension 05/19/2014  . Type 2 diabetes mellitus with complication, without long-term current use of insulin ([place]) 05/19/2014    Otelia Limes, PTA 11/26/2018, 12:10 PM  [place], [place], [address] Phone: [phone]   Fax:  [phone]  Name: Amanda Duarte MRN: [id number] Date of Birth: May 06, 1955

## 2018-11-29 ENCOUNTER — Other Ambulatory Visit: Payer: Self-pay

## 2018-11-29 ENCOUNTER — Ambulatory Visit: Payer: Medicaid Other

## 2018-11-29 DIAGNOSIS — G8929 Other chronic pain: Secondary | ICD-10-CM

## 2018-11-29 DIAGNOSIS — M25612 Stiffness of left shoulder, not elsewhere classified: Secondary | ICD-10-CM

## 2018-11-29 DIAGNOSIS — R293 Abnormal posture: Secondary | ICD-10-CM

## 2018-11-29 DIAGNOSIS — M25512 Pain in left shoulder: Secondary | ICD-10-CM | POA: Diagnosis not present

## 2018-11-29 NOTE — Therapy (Signed)
[place], [place], [address] Phone: [phone]   Fax:  [phone]  Physical Therapy Treatment  Patient Details  Name: Amanda Duarte MRN: [id number] Date of Birth: [birth year] Referring Provider (PT): [name]   Encounter Date: 11/29/2018  PT End of Session - 11/29/18 1102    Visit Number  8    Number of Visits  15    Date for PT Re-Evaluation  12/13/18    Authorization Type  3 visits until 8/25, authorization requested for 12 more visits from [facility] on 8/25 - new auth for 8 visits from 10/15 to 11/11    Authorization - Visit Number  7    Authorization - Number of Visits  15    PT Start Time  1005    PT Stop Time  1059    PT Time Calculation (min)  54 min    Activity Tolerance  Patient tolerated treatment well    Behavior During Therapy  [facility] for tasks assessed/performed       Past Medical History:  Diagnosis Date  . Cancer ([place]) 03/2017   left breast cancer  . Diabetes mellitus without complication ([place])   . Hyperlipidemia   . Hypertension   . Malignant neoplasm of left breast ([place])   . Tibial plateau fracture, left    04-13-17 had ORIF    Past Surgical History:  Procedure Laterality Date  . MASTECTOMY Left 2019  . MASTECTOMY MODIFIED RADICAL Left 11/20/2017   Procedure: LEFT MODIFIED RADICAL MASTECTOMY;  Surgeon: Fanny Skates, MD;  Location: [facility];  Service: General;  Laterality: Left;  . ORIF TIBIA PLATEAU Left 04/13/2017   Procedure: OPEN REDUCTION INTERNAL FIXATION (ORIF) TIBIAL PLATEAU;  Surgeon: Shona Needles, MD;  Location: [place];  Service: Orthopedics;  Laterality: Left;  . PORT-A-CATH REMOVAL Right 11/20/2017   Procedure: REMOVAL PORT-A-CATH;  Surgeon: Fanny Skates, MD;  Location: [place];  Service: General;  Laterality: Right;  . PORTACATH PLACEMENT Right 05/31/2017   Procedure: INSERTION PORT-A-CATH;  Surgeon: Fanny Skates, MD;  Location: [place];   Service: General;  Laterality: Right;    There were no vitals filed for this visit.  Subjective Assessment - 11/29/18 1008    Subjective  My Lt shoulder is hurting today. It's been okay during the day but it hurts at night.    Pertinent History  Pt presents s/p Lt modified radical mastectomy on 11/20/17 due to triple negative breast cancer. Radiation completed 01/02/18-02/14/18. Chemotherapy completed neoadjuvantly.  Osteopenia, DM, HTN,    Patient Stated Goals  move the arm more    Currently in Pain?  Yes    Pain Score  4     Pain Location  Arm    Pain Orientation  Left;Upper    Pain Descriptors / Indicators  Stabbing    Pain Type  Surgical pain    Pain Onset  More than a month ago    Pain Frequency  Intermittent    Aggravating Factors   moving arms more; lately at night    Pain Relieving Factors  just doesn't hurt sometimes; also when I walk it sometimes feel better                       [facility] Adult PT Treatment/Exercise - 11/29/18 0001      Shoulder Exercises: Pulleys   Flexion  2 minutes    Flexion Limitations  Tactile cues to decrease Lt scapular compensation  Scaption  1 minute    Scaption Limitations  Demo to remind pt of correct technique      Shoulder Exercises: Therapy Ball   Flexion  Both;10 reps   forward lean into end of stretch   Flexion Limitations  Pt returned therapist demo and required multiple tactile and VCs during to not bounce at end of stretch and to cont to work on decreasing Lt scapular compensation      Shoulder Exercises: IT sales professional  3 reps;20 seconds   in Administrator, arts Limitations  this very challenging for pt and tactile cues with demo througout for technique    Wall Stretch - ABduction  5 reps   3 sec holds, "snow angel"     Manual Therapy   Joint Mobilization  GH AP glides in neutral for shoulder positioning- conts to tolerate without increased pain, just some discomfort    Soft tissue mobilization   With thick massage cream to the anterior and lateral shoulder,also to superior to mastectomy scar, and into the anterior pectoralis/axilla    Passive ROM  to the Lt shoulder to tolerance with min-mod guarding, pt continues to be very limited in ER, was able to improve flexion and abduction with PROM some, though conts with very limited tolerance, especially with abduction                  PT Long Term Goals - 11/15/18 1013      PT LONG TERM GOAL #1   Title  Pt will improve Lt shoulder flexion to 145 deg or greater to improve mobility    Baseline  111, on 09/18/2018: 115, 11/15/18- 137    Time  8    Period  Weeks    Status  On-going      PT LONG TERM GOAL #2   Title  pt will improve Lt shoulder abduction to 145 or greater to improve mobility    Baseline  86,on eval, 90 on 8/25, 11/15/18- 80 unable to go further without moving in to abdcution    Time  8    Period  Weeks    Status  On-going      PT LONG TERM GOAL #3   Title  Pt will decrease Lt shoulder pain to 2/10 at the most    Baseline  6/10 on eval, 6/10 on 8/25, 11/15/18- 8/10 when moving arm    Time  8    Period  Weeks    Status  On-going      PT LONG TERM GOAL #4   Title  Pt will have no difficulty getting dressed or undressed due to shoulder pain    Baseline  needs assist to remove shirt on 09/18/2018, 11/15/18- has to use mostly the right arm due to limited ROM but is able to do it herself    Time  8    Period  Weeks    Status  On-going            Plan - 11/29/18 1103    Clinical Impression Statement  Interpreter was persent today so was able to question pt better on recent tolerance to last session. She reports is tolerating pressure okay with soft tissue mobs, but does have some inceased pain after session with soreness. Explained to pt that increased soreness is normal, and pt verbalized understanding this. Overall improvement is noticed by this therapist over past few sessions with tissue tightness  softening and end P/ROM  improvong along with pt toelrance to manual therapy in general. She also reports beginning to notice good improvements with reaching better at home and able to don/doff shirts/coats with better ease.    Personal Factors and Comorbidities  Time since onset of injury/illness/exacerbation;Comorbidity 1    Comorbidities  radiation history    Examination-Activity Limitations  Lift;Reach Overhead;Dressing    Examination-Participation Restrictions  Yard Work;Cleaning;Community Activity;Laundry    Stability/Clinical Decision Making  Stable/Uncomplicated    Rehab Potential  Excellent    PT Frequency  2x / week    PT Duration  4 weeks    PT Treatment/Interventions  ADLs/Self Care Home Management;Manual techniques;Taping;Passive range of motion;Therapeutic exercise    PT Next Visit Plan  Medicaid renewal next session?? Try supine scapular series with yellow theraband if pts ROM allows? Cont Lt shoulder mobility and ROM, scapular STM PRN, joint mobs including ribs, add active assisted stretching, and cont with pulleys, ball on wall    PT Home Exercise Plan  [id number], wall stretch for shoulder flexion    Consulted and Agree with Plan of Care  Patient       Patient will benefit from skilled therapeutic intervention in order to improve the following deficits and impairments:  Increased fascial restricitons, Pain, Decreased range of motion, Impaired UE functional use, Decreased knowledge of precautions  Visit Diagnosis: Stiffness of left shoulder, not elsewhere classified  Chronic left shoulder pain  Abnormal posture     Problem List Patient Active Problem List   Diagnosis Date Noted  . Mixed hyperglyceridemia 06/27/2018  . Cancer of overlapping sites of left female breast ([place]) 11/20/2017  . Port-A-Cath in place 06/09/2017  . Malignant neoplasm of upper-inner quadrant of left breast in female, estrogen receptor positive ([place]) 05/22/2017  . Closed fracture of left tibial  plateau   . Axillary lymphadenopathy 04/12/2017  . Closed fracture of lateral portion of left tibial plateau 04/11/2017  . Chest pain, neg MI, neg stress Nuc study, possible GI 05/19/2014  . Essential hypertension 05/19/2014  . Type 2 diabetes mellitus with complication, without long-term current use of insulin ([place]) 05/19/2014    Otelia Limes, PTA 11/29/2018, 11:11 AM  [place], [place], [address] Phone: [phone]   Fax:  [phone]  Name: Amanda Duarte MRN: [id number] Date of Birth: [birth year]

## 2018-12-03 ENCOUNTER — Other Ambulatory Visit: Payer: Self-pay

## 2018-12-03 ENCOUNTER — Ambulatory Visit: Payer: Medicaid Other | Attending: Nurse Practitioner | Admitting: Pharmacist

## 2018-12-03 DIAGNOSIS — Z79899 Other long term (current) drug therapy: Secondary | ICD-10-CM | POA: Insufficient documentation

## 2018-12-03 DIAGNOSIS — E118 Type 2 diabetes mellitus with unspecified complications: Secondary | ICD-10-CM | POA: Insufficient documentation

## 2018-12-03 DIAGNOSIS — I10 Essential (primary) hypertension: Secondary | ICD-10-CM | POA: Diagnosis not present

## 2018-12-03 DIAGNOSIS — Z7901 Long term (current) use of anticoagulants: Secondary | ICD-10-CM | POA: Diagnosis not present

## 2018-12-03 DIAGNOSIS — Z7984 Long term (current) use of oral hypoglycemic drugs: Secondary | ICD-10-CM | POA: Insufficient documentation

## 2018-12-03 LAB — GLUCOSE, POCT (MANUAL RESULT ENTRY): POC Glucose: 155 mg/dl — AB (ref 70–99)

## 2018-12-03 NOTE — Patient Instructions (Signed)
Thank you for coming to see me today. Please do the following:  1. Continue all medications.  2. Continue checking blood sugars at home. 3. Continue making the lifestyle changes we've discussed together during our visit. Diet and exercise play a significant role in improving your blood sugars.  4. Follow-up with me Ms. Amanda Duarte next month for A1c check.   Hypoglycemia or low blood sugar:   Low blood sugar can happen quickly and may become an emergency if not treated right away.   While this shouldn't happen often, it can be brought upon if you skip a meal or do not eat enough. Also, if your insulin or other diabetes medications are dosed too high, this can cause your blood sugar to go to low.   Warning signs of low blood sugar include: 1. Feeling shaky or dizzy 2. Feeling weak or tired  3. Excessive hunger 4. Feeling anxious or upset  5. Sweating even when you aren't exercising  What to do if I experience low blood sugar? 1. Check your blood sugar with your meter. If lower than 70, proceed to step 2.  2. Treat with 3-4 glucose tablets or 3 packets of regular sugar. If these aren't around, you can try hard candy. Yet another option would be to drink 4 ounces of fruit juice or 6 ounces of REGULAR soda.  3. Re-check your sugar in 15 minutes. If it is still below 70, do what you did in step 2 again. If has come back up, go ahead and eat a snack or small meal at this time.

## 2018-12-03 NOTE — Progress Notes (Signed)
    S:    PCP: Zelda   No chief complaint on file.  Patient arrives in good spirits.  Presents for diabetes evaluation, education, and management Patient was referred and last seen by Primary Care Provider on 10/19/18. I saw her on 11/02/18 and provided teaching and meter education.     Family/Social History:  - FHx: DM (son) - Tobacco: never smoker  - Alcohol:  Insurance coverage/medication affordability: [place] [employer]   Patient reports adherence with medications.  Current diabetes medications include: glimepiride 8 mg daily, Jardiance 25 mg daily, Janumet 50-1000 mg BID Current hypertension medications include: HCTZ 25 mg daily, losartan 50 mg daily  Current hyperlipidemia medications include: atorvastatin 40 mg daily  Patient denies hypoglycemic events.  Patient reported dietary habits:  - cutting out sugary beverages - stopped drinking coffee with milk    Patient-reported exercise habits: PT for left arm   Patient denies nocturia. Patient reports neuropathy but chemotherapy is contributing to this.  Patient denies visual changes. Patient reports self foot exams.    O:  POCT glucose: 155  Lab Results  Component Value Date   HGBA1C 9.3 (A) 10/19/2018   There were no vitals filed for this visit.  Lipid Panel     Component Value Date/Time   CHOL 117 06/27/2018 1027   TRIG 167 (H) 06/27/2018 1027   HDL 48 06/27/2018 1027   CHOLHDL 2.4 06/27/2018 1027   CHOLHDL 4.6 05/19/2014 0625   VLDL 54 (H) 05/19/2014 0625   LDLCALC 36 06/27/2018 1027   Home blood sugar range: 130-150   Clinical Atherosclerotic Cardiovascular Disease (ASCVD): No  The ASCVD Risk score Mikey Bussing DC Jr., et al., 2013) failed to calculate for the following reasons:   The valid total cholesterol range is 130 to 320 mg/dL    A/P: Diabetes longstanding currently uncontrolled but home sugar levels reveal improvement. Patient is able to verbalize appropriate hypoglycemia management plan. Patient is  adherent with medication.  -Continued current regimen. -Extensively discussed pathophysiology of diabetes, recommended lifestyle interventions, dietary effects on blood sugar control -Counseled on s/sx of and management of hypoglycemia -Next A1C anticipated 12/2018.   ASCVD risk - primary prevention in patient with diabetes. Last LDL is controlled. Continue atorvastatin 40 mg daily.  -Continued atorvastatin 40 mg.  HM: UTD on PNA, tetanus, and influenza vaccines.   Written patient instructions provided.  Total time in face to face counseling 20 minutes.   Follow up PCP Clinic Visit in 1 month.   Benard Halsted, PharmD, [place] [phone]

## 2018-12-04 ENCOUNTER — Ambulatory Visit: Payer: Medicaid Other | Admitting: Physical Therapy

## 2018-12-04 ENCOUNTER — Encounter: Payer: Self-pay | Admitting: Physical Therapy

## 2018-12-04 DIAGNOSIS — G8929 Other chronic pain: Secondary | ICD-10-CM

## 2018-12-04 DIAGNOSIS — M25612 Stiffness of left shoulder, not elsewhere classified: Secondary | ICD-10-CM

## 2018-12-04 DIAGNOSIS — M25512 Pain in left shoulder: Secondary | ICD-10-CM

## 2018-12-04 DIAGNOSIS — R293 Abnormal posture: Secondary | ICD-10-CM

## 2018-12-04 NOTE — Therapy (Signed)
[place], [place], [address] Phone: [phone]   Fax:  [phone]  Physical Therapy Treatment  Patient Details  Name: Amanda Duarte MRN: [id number] Date of Birth: 1955-03-25 Referring Provider (PT): [name]   Encounter Date: 12/04/2018  PT End of Session - 12/04/18 1002    Visit Number  9    Number of Visits  15    Date for PT Re-Evaluation  01/22/19    Authorization Type  3 visits until 8/25, authorization requested for 12 more visits from [facility] on 8/25 - new auth for 8 visits from 10/15 to 11/11, requesting 6 additional visits at 1x/wk from 11/17 to 12/29    Authorization - Visit Number  8    Authorization - Number of Visits  15    PT Start Time  [id number]   pt arrived late   PT Stop Time  1000    PT Time Calculation (min)  48 min    Activity Tolerance  Patient tolerated treatment well    Behavior During Therapy  [facility] for tasks assessed/performed       Past Medical History:  Diagnosis Date  . Cancer ([place]) 03/2017   left breast cancer  . Diabetes mellitus without complication ([place])   . Hyperlipidemia   . Hypertension   . Malignant neoplasm of left breast ([place])   . Tibial plateau fracture, left    04-13-17 had ORIF    Past Surgical History:  Procedure Laterality Date  . MASTECTOMY Left 2019  . MASTECTOMY MODIFIED RADICAL Left 11/20/2017   Procedure: LEFT MODIFIED RADICAL MASTECTOMY;  Surgeon: Fanny Skates, MD;  Location: [place];  Service: General;  Laterality: Left;  . ORIF TIBIA PLATEAU Left 04/13/2017   Procedure: OPEN REDUCTION INTERNAL FIXATION (ORIF) TIBIAL PLATEAU;  Surgeon: Shona Needles, MD;  Location: [place];  Service: Orthopedics;  Laterality: Left;  . PORT-A-CATH REMOVAL Right 11/20/2017   Procedure: REMOVAL PORT-A-CATH;  Surgeon: Fanny Skates, MD;  Location: [place];  Service: General;  Laterality: Right;  . PORTACATH PLACEMENT Right 05/31/2017   Procedure: INSERTION  PORT-A-CATH;  Surgeon: Fanny Skates, MD;  Location: [place];  Service: General;  Laterality: Right;    There were no vitals filed for this visit.  Subjective Assessment - 12/04/18 0913    Subjective  My shoulder is hurting but I can lift it a little more. I think the massage is helping.    Patient is accompained by:  Interpreter    Pertinent History  Pt presents s/p Lt modified radical mastectomy on 11/20/17 due to triple negative breast cancer. Radiation completed 01/02/18-02/14/18. Chemotherapy completed neoadjuvantly.  Osteopenia, DM, HTN,    Patient Stated Goals  move the arm more    Pain Score  3     Pain Location  --   trunk   Pain Orientation  Left    Pain Descriptors / Indicators  Stabbing    Pain Type  Surgical pain    Pain Onset  More than a month ago    Pain Frequency  Intermittent                       [facility] Adult PT Treatment/Exercise - 12/04/18 0001      Manual Therapy   Joint Mobilization  GH AP glides in neutral for shoulder positioning    Soft tissue mobilization  With thick massage cream to the anterior and lateral shoulder,also to superior to mastectomy  scar, and into the anterior pectoralis/axilla, then in right sidelying to left posterior and posterior lateral shoulder in area of increased pain and tightness    Passive ROM  to the Lt shoulder to tolerance with min guarding, pt continues to be very limited in ER, was able to improve flexion and abduction with PROM and STM, pt tolerated this well today with no increased pain only stretching, she is unable to tolerate pure abduction but can tolerate scaption                  PT Long Term Goals - 12/04/18 0951      PT LONG TERM GOAL #1   Title  Pt will improve Lt shoulder flexion to 145 deg or greater to improve mobility    Baseline  111, on 09/18/2018: 115, 11/15/18- 137, 12/04/18- 136    Time  8    Period  Weeks    Status  On-going      PT LONG TERM GOAL #2    Title  pt will improve Lt shoulder abduction to 145 or greater to improve mobility    Baseline  86,on eval, 90 on 8/25, 11/15/18- 80 unable to go further without moving in to abdcution, 12/04/18- 120 scaption    Time  8    Period  Weeks    Status  On-going      PT LONG TERM GOAL #3   Title  Pt will decrease Lt shoulder pain to 2/10 at the most    Baseline  6/10 on eval, 6/10 on 8/25, 11/15/18- 8/10 when moving arm, 12/04/18- 4/10    Time  8    Period  Weeks    Status  On-going      PT LONG TERM GOAL #4   Title  Pt will have no difficulty getting dressed or undressed due to shoulder pain    Baseline  needs assist to remove shirt on 09/18/2018, 11/15/18- has to use mostly the right arm due to limited ROM but is able to do it herself, 12/04/18- pt is now able to get dressed without difficulty    Time  8    Period  Weeks    Status  Achieved            Plan - 12/04/18 1015    Clinical Impression Statement  Assessed pt's progress towards goals in therapy today and pt is progressing towards all goals. She is now able to get dressed using both arms without increased pain. She has been compliant with her home exercise program and overall her ROM is improivng but she is still very limited with external rotation and pure abduction though she is demonstrating more scaption ROM. Pt is able to tolerate manual therapy better now with more feelings of stretching and not increased pain. Soft tissue mobilization to tight musculature seems to be helping to improve ROM as well. Pt would benefit from additional skilled PT visits to continue to progress pt towards goals in therapy and continue to improve her ROM and decrease pain.    Rehab Potential  Excellent    PT Frequency  1x / week    PT Duration  6 weeks    PT Treatment/Interventions  ADLs/Self Care Home Management;Manual techniques;Taping;Passive range of motion;Therapeutic exercise    PT Next Visit Plan  check medicaid renewal, Try supine scapular  series with yellow theraband if pts ROM allows? Cont Lt shoulder mobility and ROM, scapular STM PRN, joint mobs including ribs, add active  assisted stretching, and cont with pulleys, ball on wall    PT Home Exercise Plan  [id number], wall stretch for shoulder flexion    Consulted and Agree with Plan of Care  Patient       Patient will benefit from skilled therapeutic intervention in order to improve the following deficits and impairments:  Increased fascial restricitons, Pain, Decreased range of motion, Impaired UE functional use, Decreased knowledge of precautions  Visit Diagnosis: Stiffness of left shoulder, not elsewhere classified  Chronic left shoulder pain  Abnormal posture     Problem List Patient Active Problem List   Diagnosis Date Noted  . Mixed hyperglyceridemia 06/27/2018  . Cancer of overlapping sites of left female breast ([place]) 11/20/2017  . Port-A-Cath in place 06/09/2017  . Malignant neoplasm of upper-inner quadrant of left breast in female, estrogen receptor positive ([place]) 05/22/2017  . Closed fracture of left tibial plateau   . Axillary lymphadenopathy 04/12/2017  . Closed fracture of lateral portion of left tibial plateau 04/11/2017  . Chest pain, neg MI, neg stress Nuc study, possible GI 05/19/2014  . Essential hypertension 05/19/2014  . Type 2 diabetes mellitus with complication, without long-term current use of insulin ([place]) 05/19/2014    Allyson Sabal [facility] 12/04/2018, 10:17 AM  [place], [place], [address] Phone: [phone]   Fax:  [phone]  Name: Amanda Duarte MRN: [id number] Date of Birth: 02/18/1955  Manus Gunning, PT 12/04/18 10:18 AM

## 2018-12-07 ENCOUNTER — Ambulatory Visit: Payer: Medicaid Other | Admitting: Hematology

## 2018-12-07 ENCOUNTER — Other Ambulatory Visit: Payer: Medicaid Other

## 2018-12-10 ENCOUNTER — Telehealth: Payer: Self-pay | Admitting: *Deleted

## 2018-12-10 NOTE — Telephone Encounter (Signed)
-----   Message from Alla Feeling, NP sent at 12/07/2018  4:02 PM EST ----- Please let her know MRI does not show evidence of bone mets or major nerve compression. Overall mostly normal scan, does not identify a cause for her back pain. If she still has pain she can f/u with PCP. Sorry for the delay, I reviewed with neurosurgery and just heard back.  Thanks, Regan Rakers

## 2018-12-10 NOTE — Telephone Encounter (Signed)
Per [name], NP called and made patient aware that there was no evidence of bone mets or major nerve compression. Overall mostly normal scan. Advised pt if there is still pain to f/u with PCP. Pt stated, "no pain." Pt verbalized understanding.

## 2018-12-12 ENCOUNTER — Ambulatory Visit: Payer: Medicaid Other

## 2018-12-12 ENCOUNTER — Other Ambulatory Visit: Payer: Self-pay

## 2018-12-12 DIAGNOSIS — R293 Abnormal posture: Secondary | ICD-10-CM | POA: Diagnosis not present

## 2018-12-12 DIAGNOSIS — M25612 Stiffness of left shoulder, not elsewhere classified: Secondary | ICD-10-CM | POA: Diagnosis not present

## 2018-12-12 DIAGNOSIS — G8929 Other chronic pain: Secondary | ICD-10-CM

## 2018-12-12 DIAGNOSIS — M25512 Pain in left shoulder: Secondary | ICD-10-CM | POA: Diagnosis not present

## 2018-12-12 NOTE — Therapy (Signed)
[place], [place], [address] Phone: [phone]   Fax:  [phone]  Physical Therapy Treatment  Patient Details  Name: Amanda Duarte MRN: [id number] Date of Birth: [birth year] Referring Provider (PT): [name]   Encounter Date: 12/12/2018  PT End of Session - 12/12/18 1001    Visit Number  10    Number of Visits  15    Date for PT Re-Evaluation  01/22/19    Authorization Type  3 visits until 8/25, authorization requested for 12 more visits from [facility] on 8/25 - new auth for 8 visits from 10/15 to 11/11, requesting 6 additional visits at 1x/wk from 11/17 to 12/29    Authorization - Visit Number  8    Authorization - Number of Visits  15    PT Start Time  1000    PT Stop Time  1058    PT Time Calculation (min)  58 min    Activity Tolerance  Patient tolerated treatment well    Behavior During Therapy  [facility] for tasks assessed/performed       Past Medical History:  Diagnosis Date  . Cancer ([place]) 03/2017   left breast cancer  . Diabetes mellitus without complication ([place])   . Hyperlipidemia   . Hypertension   . Malignant neoplasm of left breast ([place])   . Tibial plateau fracture, left    04-13-17 had ORIF    Past Surgical History:  Procedure Laterality Date  . MASTECTOMY Left 2019  . MASTECTOMY MODIFIED RADICAL Left 11/20/2017   Procedure: LEFT MODIFIED RADICAL MASTECTOMY;  Surgeon: Fanny Skates, MD;  Location: [place];  Service: General;  Laterality: Left;  . ORIF TIBIA PLATEAU Left 04/13/2017   Procedure: OPEN REDUCTION INTERNAL FIXATION (ORIF) TIBIAL PLATEAU;  Surgeon: Shona Needles, MD;  Location: [place];  Service: Orthopedics;  Laterality: Left;  . PORT-A-CATH REMOVAL Right 11/20/2017   Procedure: REMOVAL PORT-A-CATH;  Surgeon: Fanny Skates, MD;  Location: [place];  Service: General;  Laterality: Right;  . PORTACATH PLACEMENT Right 05/31/2017   Procedure: INSERTION PORT-A-CATH;   Surgeon: Fanny Skates, MD;  Location: [place];  Service: General;  Laterality: Right;    There were no vitals filed for this visit.  Subjective Assessment - 12/12/18 1002    Subjective  My shoulder was hurting in the night and feels like throbbing pain.  She states that she has some pain in her L hip as well this morning.    Patient is accompained by:  Interpreter    Pertinent History  Pt presents s/p Lt modified radical mastectomy on 11/20/17 due to triple negative breast cancer. Radiation completed 01/02/18-02/14/18. Chemotherapy completed neoadjuvantly.  Osteopenia, DM, HTN,    Limitations  Lifting    Patient Stated Goals  move the arm more    Currently in Pain?  Yes    Pain Score  4     Pain Location  Scapula    Pain Orientation  Left    Pain Descriptors / Indicators  Aching    Pain Type  Surgical pain    Pain Onset  More than a month ago    Pain Frequency  Intermittent    Aggravating Factors   moving arms more and at night.    Pain Relieving Factors  rest, walking                       [facility] Adult PT Treatment/Exercise - 12/12/18 0001  Shoulder Exercises: Supine   Horizontal ABduction  AROM;Right;Left;10 reps    Horizontal ABduction Limitations  VC and constant tactile cueing to prevent extension of the LUE to compensate for decreased horizontal abdct alt movement.     External Rotation  AAROM;Left;10 reps    External Rotation Limitations  with dowel in supine with tactile cueing throughout for correct movement, VC to relax the L UE and to keep elbow tucked.     Other Supine Exercises  Supine chest press w/dowel 10x , Supine chest press w/flexion using dowel 10x with VC and tactile cueing to keep L elbow straight and for correct movement.     Other Supine Exercises  Supine external rotation w/hands behind head 10x active stretching with demonstration and VC to avoid pain.       Shoulder Exercises: Seated   Retraction  AROM;10 reps;Both     Retraction Limitations  VC, demonstration, and tactile cueing for correct movement for scapular retraction    External Rotation  Strengthening;Both;10 reps    External Rotation Limitations  seated external rotation with elbows at 90 degrees with very limited ER at the L UE with tactile cueing at LUE in order to decrease elbow abduction.       Shoulder Exercises: Standing   Flexion  AAROM;10 reps;Left    Flexion Limitations  VC, demonstration and tactile cueing for correct moement and hold to prevent compensation    ABduction  AAROM;Left    ABduction Limitations  attempted but pt is unable to get into abduction.     Other Standing Exercises  standing unilateral pec stretch on the wall with VC to avoid pain and for light stretching due to significant pectoralis tightness.       Manual Therapy   Manual Therapy  Soft tissue mobilization;Passive ROM;Joint mobilization;Manual Lymphatic Drainage (MLD)    Joint Mobilization  L shoulder mobs and glenohumeral posterior/inferior glides grade III 8x8 reps for improve capsular stretch prior to P/ROM    Soft tissue mobilization  IASTM to the L anterior chest wall/lateral chest wall and anterior shoulder with significant tightness/tenderness/crepitus noted; decreasd minimally following light IASTM    Manual Lymphatic Drainage (MLD)  in side/lying posterior inter-axillary anastomosis, L axillo-inguinal anastomosis, L axillary  nodes and figure 7 to L axillary-inguinal anastomosis to decrease risk for fluid build up following IASTM.     Passive ROM  L shoulder flexion, ER and pure abduction with end range firm end feel with oscillations and distraction throughout.              PT Education - 12/12/18 1101    Education Details  pt will continue with wall stretches at home and try to stretch into pure abduction    Person(s) Educated  Patient    Methods  Explanation;Demonstration    Comprehension  Verbalized understanding;Returned demonstration;Other  (comment)   interpreter         PT Long Term Goals - 12/04/18 0951      PT LONG TERM GOAL #1   Title  Pt will improve Lt shoulder flexion to 145 deg or greater to improve mobility    Baseline  111, on 09/18/2018: 115, 11/15/18- 137, 12/04/18- 136    Time  8    Period  Weeks    Status  On-going      PT LONG TERM GOAL #2   Title  pt will improve Lt shoulder abduction to 145 or greater to improve mobility    Baseline  86,on eval, 90  on 8/25, 11/15/18- 80 unable to go further without moving in to abdcution, 12/04/18- 120 scaption    Time  8    Period  Weeks    Status  On-going      PT LONG TERM GOAL #3   Title  Pt will decrease Lt shoulder pain to 2/10 at the most    Baseline  6/10 on eval, 6/10 on 8/25, 11/15/18- 8/10 when moving arm, 12/04/18- 4/10    Time  8    Period  Weeks    Status  On-going      PT LONG TERM GOAL #4   Title  Pt will have no difficulty getting dressed or undressed due to shoulder pain    Baseline  needs assist to remove shirt on 09/18/2018, 11/15/18- has to use mostly the right arm due to limited ROM but is able to do it herself, 12/04/18- pt is now able to get dressed without difficulty    Time  8    Period  Weeks    Status  Achieved            Plan - 12/12/18 1000    Clinical Impression Statement  Pt presents with significant tightness continuing into abduction of the L shoulder with compensation into scaption. Significant tightness noted over the anterior/lateral L chest wall and in the anterior shoulder; decreased minimally following light IASTM with crepitus slightly decreased. Pt reports slight decrease in pain by end of session. L Glenohumeral joint mobs for improve mobility to stretch the joint capsule and facilitate fluid exchange prior to P/ROM for increased range. Pt is guarded which improves with oscillations and distractions especially into pure abduction. Modified MLD was performed of the L posterior upper quadrant and L side to decrease  risk of increase fluid following IASTM. Pt will benefit form continued POC at this time.    Personal Factors and Comorbidities  Time since onset of injury/illness/exacerbation;Comorbidity 1    Comorbidities  radiation history    Examination-Activity Limitations  Lift;Reach Overhead;Dressing    Examination-Participation Restrictions  Yard Work;Cleaning;Community Activity;Laundry    PT Frequency  1x / week    PT Duration  6 weeks    PT Treatment/Interventions  ADLs/Self Care Home Management;Manual techniques;Taping;Passive range of motion;Therapeutic exercise    PT Next Visit Plan  check medicaid renewal, Try supine scapular series with yellow theraband if pts ROM allows? Cont Lt shoulder mobility and ROM, scapular STM PRN, joint mobs including ribs, add active assisted stretching, and cont with pulleys, ball on wall    PT Home Exercise Plan  [id number], wall stretch for shoulder flexion    Consulted and Agree with Plan of Care  Patient       Patient will benefit from skilled therapeutic intervention in order to improve the following deficits and impairments:  Increased fascial restricitons, Pain, Decreased range of motion, Impaired UE functional use, Decreased knowledge of precautions  Visit Diagnosis: Stiffness of left shoulder, not elsewhere classified  Chronic left shoulder pain  Abnormal posture     Problem List Patient Active Problem List   Diagnosis Date Noted  . Mixed hyperglyceridemia 06/27/2018  . Cancer of overlapping sites of left female breast ([place]) 11/20/2017  . Port-A-Cath in place 06/09/2017  . Malignant neoplasm of upper-inner quadrant of left breast in female, estrogen receptor positive ([place]) 05/22/2017  . Closed fracture of left tibial plateau   . Axillary lymphadenopathy 04/12/2017  . Closed fracture of lateral portion of left tibial plateau 04/11/2017  .  Chest pain, neg MI, neg stress Nuc study, possible GI 05/19/2014  . Essential hypertension 05/19/2014  . Type  2 diabetes mellitus with complication, without long-term current use of insulin ([place]) 05/19/2014    Ander Purpura, PT 12/12/2018, 11:05 AM  [place], [place], [address] Phone: [phone]   Fax:  [phone]  Name: Amanda Duarte MRN: [id number] Date of Birth: 24-Aug-1955

## 2018-12-17 ENCOUNTER — Encounter: Payer: Self-pay | Admitting: Physical Therapy

## 2018-12-17 ENCOUNTER — Other Ambulatory Visit: Payer: Self-pay

## 2018-12-17 ENCOUNTER — Ambulatory Visit: Payer: Medicaid Other | Admitting: Physical Therapy

## 2018-12-17 DIAGNOSIS — R293 Abnormal posture: Secondary | ICD-10-CM | POA: Diagnosis not present

## 2018-12-17 DIAGNOSIS — M25612 Stiffness of left shoulder, not elsewhere classified: Secondary | ICD-10-CM | POA: Diagnosis not present

## 2018-12-17 DIAGNOSIS — M25512 Pain in left shoulder: Secondary | ICD-10-CM | POA: Diagnosis not present

## 2018-12-17 DIAGNOSIS — G8929 Other chronic pain: Secondary | ICD-10-CM

## 2018-12-17 NOTE — Therapy (Signed)
[place], [place], [address] Phone: [phone]   Fax:  [phone]  Physical Therapy Treatment  Patient Details  Name: Amanda Duarte MRN: [id number] Date of Birth: [birth year] Referring Provider (PT): [name]   Encounter Date: 12/17/2018  PT End of Session - 12/17/18 1048    Visit Number  11    Number of Visits  15    Date for PT Re-Evaluation  01/22/19    Authorization Type  3 visits until 8/25, authorization requested for 12 more visits from [facility] on 8/25 - new auth for 8 visits from 10/15 to 11/11, requesting 6 additional visits at 1x/wk from 11/17 to 12/29    Authorization - Visit Number  9    Authorization - Number of Visits  15    PT Start Time  1004    PT Stop Time  1047    PT Time Calculation (min)  43 min    Activity Tolerance  Patient tolerated treatment well    Behavior During Therapy  [facility] for tasks assessed/performed       Past Medical History:  Diagnosis Date  . Cancer ([place]) 03/2017   left breast cancer  . Diabetes mellitus without complication ([place])   . Hyperlipidemia   . Hypertension   . Malignant neoplasm of left breast ([place])   . Tibial plateau fracture, left    04-13-17 had ORIF    Past Surgical History:  Procedure Laterality Date  . MASTECTOMY Left 2019  . MASTECTOMY MODIFIED RADICAL Left 11/20/2017   Procedure: LEFT MODIFIED RADICAL MASTECTOMY;  Surgeon: Fanny Skates, MD;  Location: [place];  Service: General;  Laterality: Left;  . ORIF TIBIA PLATEAU Left 04/13/2017   Procedure: OPEN REDUCTION INTERNAL FIXATION (ORIF) TIBIAL PLATEAU;  Surgeon: Shona Needles, MD;  Location: [place];  Service: Orthopedics;  Laterality: Left;  . PORT-A-CATH REMOVAL Right 11/20/2017   Procedure: REMOVAL PORT-A-CATH;  Surgeon: Fanny Skates, MD;  Location: [place];  Service: General;  Laterality: Right;  . PORTACATH PLACEMENT Right 05/31/2017   Procedure: INSERTION PORT-A-CATH;   Surgeon: Fanny Skates, MD;  Location: [place];  Service: General;  Laterality: Right;    There were no vitals filed for this visit.  Subjective Assessment - 12/17/18 1005    Subjective  I can lift up my arm more but my arm still hurts. It does not hurt like before but it hurts.    Patient is accompained by:  Interpreter    Pertinent History  Pt presents s/p Lt modified radical mastectomy on 11/20/17 due to triple negative breast cancer. Radiation completed 01/02/18-02/14/18. Chemotherapy completed neoadjuvantly.  Osteopenia, DM, HTN,    Patient Stated Goals  move the arm more    Currently in Pain?  Yes    Pain Score  4     Pain Location  Scapula    Pain Orientation  Left    Pain Descriptors / Indicators  Aching    Pain Type  Surgical pain    Pain Onset  More than a month ago                       [facility] Adult PT Treatment/Exercise - 12/17/18 0001      Shoulder Exercises: Supine   Horizontal ABduction  AROM;Right;Left;10 reps    Horizontal ABduction Limitations  VC and constant tactile cueing to prevent aduction of the LUE to compensate for decreased horizontal abdct alt movement.  Other Supine Exercises  Supine chest press w/dowel 10x     Other Supine Exercises  Supine external rotation w/hands behind head 10x active stretching with demonstration and VC to avoid pain.       Shoulder Exercises: Seated   Retraction  AROM;10 reps;Both    Retraction Limitations  VC, demonstration, and tactile cueing for correct movement for scapular retraction    External Rotation  Strengthening;Both;10 reps    External Rotation Limitations  seated external rotation with elbows at 90 degrees with very limited ER at the L UE with tactile cueing at LUE in order to decrease elbow abduction.       Shoulder Exercises: Standing   Flexion  AAROM;10 reps;Left    Flexion Limitations  VC, demonstration and tactile cueing for correct movement with 3 sec holds    ABduction   AAROM;Left;10 reps   using dowel   ABduction Limitations  pt able to demonstrate pure abduction today but in limited ROM., she required verbal and tactile cues to avoid scaption      Manual Therapy   Joint Mobilization  L shoulder mobs and glenohumeral posterior glides grade III 10 reps for improve capsular stretch while gently moving arm into increasing abduction with no pain reported by patient    Soft tissue mobilization  to upper left arm in area of pain, anterior shoulder and pec as well as lateral trunk on left where pt has increased tightness    Passive ROM  L shoulder flexion, ER and pure abduction with end range firm end feel with oscillations and distraction throughout.                   PT Long Term Goals - 12/04/18 0951      PT LONG TERM GOAL #1   Title  Pt will improve Lt shoulder flexion to 145 deg or greater to improve mobility    Baseline  111, on 09/18/2018: 115, 11/15/18- 137, 12/04/18- 136    Time  8    Period  Weeks    Status  On-going      PT LONG TERM GOAL #2   Title  pt will improve Lt shoulder abduction to 145 or greater to improve mobility    Baseline  86,on eval, 90 on 8/25, 11/15/18- 80 unable to go further without moving in to abdcution, 12/04/18- 120 scaption    Time  8    Period  Weeks    Status  On-going      PT LONG TERM GOAL #3   Title  Pt will decrease Lt shoulder pain to 2/10 at the most    Baseline  6/10 on eval, 6/10 on 8/25, 11/15/18- 8/10 when moving arm, 12/04/18- 4/10    Time  8    Period  Weeks    Status  On-going      PT LONG TERM GOAL #4   Title  Pt will have no difficulty getting dressed or undressed due to shoulder pain    Baseline  needs assist to remove shirt on 09/18/2018, 11/15/18- has to use mostly the right arm due to limited ROM but is able to do it herself, 12/04/18- pt is now able to get dressed without difficulty    Time  8    Period  Weeks    Status  Achieved            Plan - 12/17/18 1049    Clinical  Impression Statement  Pt reports she is still  having pain in her left shoulder but she continues to demonstrate improving function of LUE. Continued with AAROM exercises which require mod/max verbal/tactile cues to prevent compensation especially when instructing pt in abduction. Pt tolerated manual therapy well today with no increase in pain with left shoulder mobs and improved abduction noted.    PT Frequency  1x / week    PT Duration  6 weeks    PT Treatment/Interventions  ADLs/Self Care Home Management;Manual techniques;Taping;Passive range of motion;Therapeutic exercise    PT Next Visit Plan  Try supine scapular series with yellow theraband if pts ROM allows? Cont Lt shoulder mobility and ROM, scapular STM PRN, joint mobs including ribs, add active assisted stretching, and cont with pulleys, ball on wall    PT Home Exercise Plan  [id number], wall stretch for shoulder flexion    Consulted and Agree with Plan of Care  Patient       Patient will benefit from skilled therapeutic intervention in order to improve the following deficits and impairments:  Increased fascial restricitons, Pain, Decreased range of motion, Impaired UE functional use, Decreased knowledge of precautions  Visit Diagnosis: Chronic left shoulder pain  Abnormal posture     Problem List Patient Active Problem List   Diagnosis Date Noted  . Mixed hyperglyceridemia 06/27/2018  . Cancer of overlapping sites of left female breast ([place]) 11/20/2017  . Port-A-Cath in place 06/09/2017  . Malignant neoplasm of upper-inner quadrant of left breast in female, estrogen receptor positive ([place]) 05/22/2017  . Closed fracture of left tibial plateau   . Axillary lymphadenopathy 04/12/2017  . Closed fracture of lateral portion of left tibial plateau 04/11/2017  . Chest pain, neg MI, neg stress Nuc study, possible GI 05/19/2014  . Essential hypertension 05/19/2014  . Type 2 diabetes mellitus with complication, without long-term current  use of insulin ([place]) 05/19/2014    Allyson Sabal [facility] 12/17/2018, 10:51 AM  [place], [place], [address] Phone: [phone]   Fax:  [phone]  Name: Abijah Riedel MRN: [id number] Date of Birth: 10-Apr-1955  Manus Gunning, PT 12/17/18 10:51 AM

## 2018-12-25 ENCOUNTER — Ambulatory Visit: Payer: Medicaid Other | Attending: Hematology

## 2018-12-25 ENCOUNTER — Other Ambulatory Visit: Payer: Self-pay

## 2018-12-25 DIAGNOSIS — M25612 Stiffness of left shoulder, not elsewhere classified: Secondary | ICD-10-CM

## 2018-12-25 DIAGNOSIS — G8929 Other chronic pain: Secondary | ICD-10-CM | POA: Diagnosis not present

## 2018-12-25 DIAGNOSIS — M25512 Pain in left shoulder: Secondary | ICD-10-CM | POA: Diagnosis not present

## 2018-12-25 DIAGNOSIS — R293 Abnormal posture: Secondary | ICD-10-CM | POA: Diagnosis not present

## 2018-12-25 NOTE — Patient Instructions (Addendum)
Over Head Pull: Narrow Grip   Cancer Rehab [phone] oficina de rehabilitacion de cancer: [id number]    Caprice [place], rodillas flexionadas, pies apoyados, banda a travs de los muslos, codos extendidos [employer] Separe las manos (punto de partida). Manteniendo los codos extendidos, [employer] brazos [place] y por encima de la cabeza, manos [place]. Mantenga la tensin uniforme en la banda. Sostenga por un momento. Regrese lentamente, manteniendo la tensin, a la posicin de inicio. Haga lo mismo abriendo los brasos un poco mas. Repita __5-10_ veces. Banda color __amarillo______   Copyright  VHI. All rights reserved.    Side Pull: Double Arm    [place], rodillas flexionadas, pies apoyados. Brazos perpendiculares al cuerpo, hombros a nivel, codos extendidos pero relajados. [employer], codos extendidos. La banda de resistencia debe cruzar las Advertising account executive, y las manos hacia el piso. Sostenga por un momento. Regrese lentamente a la posicin de inicio. Repita _5-10__ veces. Banda color __amarillo______    Copyright  VHI. All rights reserved.

## 2018-12-25 NOTE — Therapy (Signed)
[place], [place], [address] Phone: [phone]   Fax:  [phone]  Physical Therapy Treatment  Patient Details  Name: Amanda Duarte MRN: [id number] Date of Birth: 1955/04/05 Referring Provider (PT): [name]   Encounter Date: 12/25/2018  PT End of Session - 12/25/18 1003    Visit Number  12    Number of Visits  15    Date for PT Re-Evaluation  01/22/19    Authorization Type  3 visits until 8/25, authorization requested for 12 more visits from [facility] on 8/25 - new auth for 8 visits from 10/15 to 11/11, requesting 6 additional visits at 1x/wk from 11/17 to 12/29    Authorization - Visit Number  10    Authorization - Number of Visits  15    PT Start Time  0909    PT Stop Time  1003    PT Time Calculation (min)  54 min    Activity Tolerance  Patient tolerated treatment well    Behavior During Therapy  [facility] for tasks assessed/performed       Past Medical History:  Diagnosis Date  . Cancer ([place]) 03/2017   left breast cancer  . Diabetes mellitus without complication ([place])   . Hyperlipidemia   . Hypertension   . Malignant neoplasm of left breast ([place])   . Tibial plateau fracture, left    04-13-17 had ORIF    Past Surgical History:  Procedure Laterality Date  . MASTECTOMY Left 2019  . MASTECTOMY MODIFIED RADICAL Left 11/20/2017   Procedure: LEFT MODIFIED RADICAL MASTECTOMY;  Surgeon: Fanny Skates, MD;  Location: [place];  Service: General;  Laterality: Left;  . ORIF TIBIA PLATEAU Left 04/13/2017   Procedure: OPEN REDUCTION INTERNAL FIXATION (ORIF) TIBIAL PLATEAU;  Surgeon: Shona Needles, MD;  Location: [place];  Service: Orthopedics;  Laterality: Left;  . PORT-A-CATH REMOVAL Right 11/20/2017   Procedure: REMOVAL PORT-A-CATH;  Surgeon: Fanny Skates, MD;  Location: [place];  Service: General;  Laterality: Right;  . PORTACATH PLACEMENT Right 05/31/2017   Procedure: INSERTION PORT-A-CATH;   Surgeon: Fanny Skates, MD;  Location: [place];  Service: General;  Laterality: Right;    There were no vitals filed for this visit.  Subjective Assessment - 12/25/18 0911    Subjective  My Lt chest wall was hurting alot last night I think from the weather.    Pertinent History  Pt presents s/p Lt modified radical mastectomy on 11/20/17 due to triple negative breast cancer. Radiation completed 01/02/18-02/14/18. Chemotherapy completed neoadjuvantly.  Osteopenia, DM, HTN,    Patient Stated Goals  move the arm more    Currently in Pain?  Yes    Pain Score  4     Pain Location  Chest    Pain Orientation  Left    Pain Descriptors / Indicators  Other (Comment)   pinching and stretching   Pain Type  Surgical pain    Pain Radiating Towards  axilla and upper arm    Pain Onset  More than a month ago    Pain Frequency  Intermittent    Aggravating Factors   maybe weather last night made it worse    Pain Relieving Factors  rest and gentle stretch                       [facility] Adult PT Treatment/Exercise - 12/25/18 0001      Shoulder Exercises: Supine   Horizontal  ABduction  Strengthening;Both;5 reps;Theraband    Theraband Level (Shoulder Horizontal ABduction)  Level 1 (Yellow)    External Rotation Limitations  unable to do this motion at all due to limitations and pain    Flexion  Strengthening;Both;5 reps;Theraband   Narrow and Wide grip, 5 times each   Theraband Level (Shoulder Flexion)  Level 1 (Yellow)      Manual Therapy   Joint Mobilization  L shoulder mobs and glenohumeral posterior glides grade II    Soft tissue mobilization  to upper left arm in area of pain, anterior shoulder and pec as well as lateral trunk on left where pt has increased tightness    Passive ROM  L shoulder flexion, D2, er and pure abduction with end range firm end feel with oscillations and distraction throughout.              PT Education - 12/25/18 0929    Education  Details  Supine modified scapular series (horz abd and flexion with narrow and wide grip)    Person(s) Educated  Patient    Methods  Explanation;Demonstration;Verbal cues;Handout    Comprehension  Verbalized understanding;Returned demonstration;Need further instruction          PT Long Term Goals - 12/04/18 0951      PT LONG TERM GOAL #1   Title  Pt will improve Lt shoulder flexion to 145 deg or greater to improve mobility    Baseline  111, on 09/18/2018: 115, 11/15/18- 137, 12/04/18- 136    Time  8    Period  Weeks    Status  On-going      PT LONG TERM GOAL #2   Title  pt will improve Lt shoulder abduction to 145 or greater to improve mobility    Baseline  86,on eval, 90 on 8/25, 11/15/18- 80 unable to go further without moving in to abdcution, 12/04/18- 120 scaption    Time  8    Period  Weeks    Status  On-going      PT LONG TERM GOAL #3   Title  Pt will decrease Lt shoulder pain to 2/10 at the most    Baseline  6/10 on eval, 6/10 on 8/25, 11/15/18- 8/10 when moving arm, 12/04/18- 4/10    Time  8    Period  Weeks    Status  On-going      PT LONG TERM GOAL #4   Title  Pt will have no difficulty getting dressed or undressed due to shoulder pain    Baseline  needs assist to remove shirt on 09/18/2018, 11/15/18- has to use mostly the right arm due to limited ROM but is able to do it herself, 12/04/18- pt is now able to get dressed without difficulty    Time  8    Period  Weeks    Status  Achieved            Plan - 12/25/18 1003    Clinical Impression Statement  Progressed HEP to include some of supine scapular series as pt could tolerate. Some increased pain in upper arm after so reminded pt not to push into pain and only do these 1x/day or every other day. Continued with manual therapy and pt is improved with tenderness and tightness, though still very limited overall, since this therapist saw her last.    Personal Factors and Comorbidities  Time since onset of  injury/illness/exacerbation;Comorbidity 1    Comorbidities  radiation history    Examination-Activity Limitations  Lift;Reach Overhead;Dressing    Examination-Participation Restrictions  Yard Work;Cleaning;Community Activity;Laundry    Stability/Clinical Decision Making  Stable/Uncomplicated    Rehab Potential  Excellent    PT Frequency  1x / week    PT Duration  6 weeks    PT Treatment/Interventions  ADLs/Self Care Home Management;Manual techniques;Taping;Passive range of motion;Therapeutic exercise    PT Next Visit Plan  Review modified supine scapular series with yellow theraband/how is this going at home? Cont Lt shoulder mobility and ROM, scapular STM PRN, joint mobs including ribs, add active assisted stretching, and cont with pulleys, ball on wall    PT Home Exercise Plan  [id number], wall stretch for shoulder flexion; modified supine scapular series    Consulted and Agree with Plan of Care  Patient       Patient will benefit from skilled therapeutic intervention in order to improve the following deficits and impairments:  Increased fascial restricitons, Pain, Decreased range of motion, Impaired UE functional use, Decreased knowledge of precautions  Visit Diagnosis: Chronic left shoulder pain  Abnormal posture  Stiffness of left shoulder, not elsewhere classified     Problem List Patient Active Problem List   Diagnosis Date Noted  . Mixed hyperglyceridemia 06/27/2018  . Cancer of overlapping sites of left female breast ([place]) 11/20/2017  . Port-A-Cath in place 06/09/2017  . Malignant neoplasm of upper-inner quadrant of left breast in female, estrogen receptor positive ([place]) 05/22/2017  . Closed fracture of left tibial plateau   . Axillary lymphadenopathy 04/12/2017  . Closed fracture of lateral portion of left tibial plateau 04/11/2017  . Chest pain, neg MI, neg stress Nuc study, possible GI 05/19/2014  . Essential hypertension 05/19/2014  . Type 2 diabetes mellitus with  complication, without long-term current use of insulin ([place]) 05/19/2014    Otelia Limes, PTA 12/25/2018, 10:06 AM  [place], [place], [address] Phone: [phone]   Fax:  [phone]  Name: Jumana Hartwig MRN: [id number] Date of Birth: 1956-01-20

## 2018-12-27 DIAGNOSIS — C773 Secondary and unspecified malignant neoplasm of axilla and upper limb lymph nodes: Secondary | ICD-10-CM | POA: Diagnosis not present

## 2018-12-27 DIAGNOSIS — E785 Hyperlipidemia, unspecified: Secondary | ICD-10-CM | POA: Diagnosis not present

## 2018-12-27 DIAGNOSIS — C50912 Malignant neoplasm of unspecified site of left female breast: Secondary | ICD-10-CM | POA: Diagnosis not present

## 2018-12-27 DIAGNOSIS — I10 Essential (primary) hypertension: Secondary | ICD-10-CM | POA: Diagnosis not present

## 2018-12-27 DIAGNOSIS — E119 Type 2 diabetes mellitus without complications: Secondary | ICD-10-CM | POA: Diagnosis not present

## 2019-01-01 ENCOUNTER — Ambulatory Visit: Payer: Medicaid Other

## 2019-01-01 ENCOUNTER — Other Ambulatory Visit: Payer: Self-pay

## 2019-01-01 DIAGNOSIS — M25512 Pain in left shoulder: Secondary | ICD-10-CM

## 2019-01-01 DIAGNOSIS — R293 Abnormal posture: Secondary | ICD-10-CM | POA: Diagnosis not present

## 2019-01-01 DIAGNOSIS — M25612 Stiffness of left shoulder, not elsewhere classified: Secondary | ICD-10-CM | POA: Diagnosis not present

## 2019-01-01 DIAGNOSIS — G8929 Other chronic pain: Secondary | ICD-10-CM | POA: Diagnosis not present

## 2019-01-01 NOTE — Therapy (Signed)
[place], [place], [address] Phone: [phone]   Fax:  [phone]  Physical Therapy Treatment  Patient Details  Name: Amanda Duarte MRN: [id number] Date of Birth: 1955/07/01 Referring Provider (PT): [name]   Encounter Date: 01/01/2019  PT End of Session - 01/01/19 1112    Visit Number  13    Number of Visits  15    Date for PT Re-Evaluation  01/22/19    Authorization Type  3 visits until 8/25, authorization requested for 12 more visits from [facility] on 8/25 - new auth for 8 visits from 10/15 to 11/11, requesting 6 additional visits at 1x/wk from 11/17 to 12/29    Authorization - Visit Number  11    Authorization - Number of Visits  15    PT Start Time  1003    PT Stop Time  1102    PT Time Calculation (min)  59 min    Activity Tolerance  Patient tolerated treatment well    Behavior During Therapy  [facility] for tasks assessed/performed       Past Medical History:  Diagnosis Date  . Cancer ([place]) 03/2017   left breast cancer  . Diabetes mellitus without complication ([place])   . Hyperlipidemia   . Hypertension   . Malignant neoplasm of left breast ([place])   . Tibial plateau fracture, left    04-13-17 had ORIF    Past Surgical History:  Procedure Laterality Date  . MASTECTOMY Left 2019  . MASTECTOMY MODIFIED RADICAL Left 11/20/2017   Procedure: LEFT MODIFIED RADICAL MASTECTOMY;  Surgeon: Fanny Skates, MD;  Location: [place];  Service: General;  Laterality: Left;  . ORIF TIBIA PLATEAU Left 04/13/2017   Procedure: OPEN REDUCTION INTERNAL FIXATION (ORIF) TIBIAL PLATEAU;  Surgeon: Shona Needles, MD;  Location: [place];  Service: Orthopedics;  Laterality: Left;  . PORT-A-CATH REMOVAL Right 11/20/2017   Procedure: REMOVAL PORT-A-CATH;  Surgeon: Fanny Skates, MD;  Location: [place];  Service: General;  Laterality: Right;  . PORTACATH PLACEMENT Right 05/31/2017   Procedure: INSERTION PORT-A-CATH;   Surgeon: Fanny Skates, MD;  Location: [place];  Service: General;  Laterality: Right;    There were no vitals filed for this visit.  Subjective Assessment - 01/01/19 1006    Subjective  My Lt chest wall was bothering me again alot last night, even when I was laying down I could feel it stabbing in my chest and axilla.    Pertinent History  Pt presents s/p Lt modified radical mastectomy on 11/20/17 due to triple negative breast cancer. Radiation completed 01/02/18-02/14/18. Chemotherapy completed neoadjuvantly.  Osteopenia, DM, HTN,    Patient Stated Goals  move the arm more    Currently in Pain?  Yes    Pain Score  5     Pain Location  Chest    Pain Orientation  Left    Pain Descriptors / Indicators  Stabbing    Pain Type  Surgical pain    Pain Onset  More than a month ago    Pain Frequency  Intermittent    Aggravating Factors   not usre    Pain Relieving Factors  rest and gentle stretch                       [facility] Adult PT Treatment/Exercise - 01/01/19 0001      Shoulder Exercises: Standing   External Rotation  Strengthening;Left;10 reps;Theraband    Theraband  Level (Shoulder External Rotation)  Level 1 (Yellow)    External Rotation Limitations  Tactile cues for correct scapular rhythm with each rep, pt struggles with this    Internal Rotation  Strengthening;Left;10 reps;Theraband    Theraband Level (Shoulder Internal Rotation)  Level 1 (Yellow)    Internal Rotation Limitations  Pt returned therapist demo    Flexion  Strengthening;Left;10 reps;Theraband    Theraband Level (Shoulder Flexion)  Level 1 (Yellow)    Flexion Limitations  VCs to only lift to shoulder height to prevent compensation    Extension  Strengthening;Left;10 reps;Theraband    Theraband Level (Shoulder Extension)  Level 1 (Yellow)    Extension Limitations  Pt returned therapist demo    Other Standing Exercises  Trial of forearm walking with yellow theraband, demo first and then  tactile cuing for therapist throughout, this very challenging for pt due to scapular weakness, 5 reps      Shoulder Exercises: Pulleys   Flexion  2 minutes    Flexion Limitations  VCs to decrease Lt scapular compensation    ABduction  2 minutes    ABduction Limitations  Tactile cues to decrease Lt scapular compensation      Manual Therapy   Joint Mobilization  L shoulder mobs and glenohumeral posterior glides grade II    Scapular Mobilization  in R S/L to Lt scapular into protraction, retraction, and depression with and without A/ROM abduction and P/ROM er; increased tightness with limited scapular mobility noted initially but this seems to improve well during mobs.     Passive ROM  L shoulder flexion, D2, er and pure abduction with end range firm end feel with oscillations and distraction throughout.                   PT Long Term Goals - 12/04/18 0951      PT LONG TERM GOAL #1   Title  Pt will improve Lt shoulder flexion to 145 deg or greater to improve mobility    Baseline  111, on 09/18/2018: 115, 11/15/18- 137, 12/04/18- 136    Time  8    Period  Weeks    Status  On-going      PT LONG TERM GOAL #2   Title  pt will improve Lt shoulder abduction to 145 or greater to improve mobility    Baseline  86,on eval, 90 on 8/25, 11/15/18- 80 unable to go further without moving in to abdcution, 12/04/18- 120 scaption    Time  8    Period  Weeks    Status  On-going      PT LONG TERM GOAL #3   Title  Pt will decrease Lt shoulder pain to 2/10 at the most    Baseline  6/10 on eval, 6/10 on 8/25, 11/15/18- 8/10 when moving arm, 12/04/18- 4/10    Time  8    Period  Weeks    Status  On-going      PT LONG TERM GOAL #4   Title  Pt will have no difficulty getting dressed or undressed due to shoulder pain    Baseline  needs assist to remove shirt on 09/18/2018, 11/15/18- has to use mostly the right arm due to limited ROM but is able to do it herself, 12/04/18- pt is now able to get  dressed without difficulty    Time  8    Period  Weeks    Status  Achieved  Plan - 01/01/19 1113    Clinical Impression Statement  Progressed scapular stability exercises today working on forearm walking on wall, also issued [place] with yellow theraband which pt seemed to tolerate very well without increased pain though pt does require mod-max tactile and VCs for correct technique/scapular rhythm as these are very challenging for pt. Issued HEP for [place] in [place]. Continued with manual therapy including scapular mobs. Some improvement in mobility noted by end of session with improved P/ROM in flexion and with scapula.    Personal Factors and Comorbidities  Time since onset of injury/illness/exacerbation;Comorbidity 1    Comorbidities  radiation history    Examination-Activity Limitations  Lift;Reach Overhead;Dressing    Examination-Participation Restrictions  Yard Work;Cleaning;Community Activity;Laundry    Stability/Clinical Decision Making  Stable/Uncomplicated    Rehab Potential  Excellent    PT Frequency  1x / week    PT Duration  6 weeks    PT Treatment/Interventions  ADLs/Self Care Home Management;Manual techniques;Taping;Passive range of motion;Therapeutic exercise;Biofeedback    PT Next Visit Plan  Remeasure A/ROM next. Review modified supine scapular series with yellow theraband and [place]/how is this going at home? Cont Lt shoulder mobility and ROM, scapular STM PRN, joint mobs including ribs, add active assisted stretching, and cont with pulleys, ball on wall    PT Home Exercise Plan  [id number], wall stretch for shoulder flexion; modified supine scapular series; [place] with yellow theraband    Consulted and Agree with Plan of Care  Patient       Patient will benefit from skilled therapeutic intervention in order to improve the following deficits and impairments:  Increased fascial restricitons, Pain, Decreased range of motion, Impaired UE functional use,  Decreased knowledge of precautions  Visit Diagnosis: Chronic left shoulder pain  Abnormal posture  Stiffness of left shoulder, not elsewhere classified     Problem List Patient Active Problem List   Diagnosis Date Noted  . Mixed hyperglyceridemia 06/27/2018  . Cancer of overlapping sites of left female breast ([place]) 11/20/2017  . Port-A-Cath in place 06/09/2017  . Malignant neoplasm of upper-inner quadrant of left breast in female, estrogen receptor positive ([place]) 05/22/2017  . Closed fracture of left tibial plateau   . Axillary lymphadenopathy 04/12/2017  . Closed fracture of lateral portion of left tibial plateau 04/11/2017  . Chest pain, neg MI, neg stress Nuc study, possible GI 05/19/2014  . Essential hypertension 05/19/2014  . Type 2 diabetes mellitus with complication, without long-term current use of insulin ([place]) 05/19/2014    Otelia Limes, PTA 01/01/2019, 12:23 PM  [place], [place], [address] Phone: [phone]   Fax:  [phone]  Name: Amanda Duarte MRN: [id number] Date of Birth: 05/19/1955

## 2019-01-01 NOTE — Patient Instructions (Addendum)
Strengthening: Resisted Flexion    Cancer Rehab 336 [phone]    Sostenga una banda elstica con el brazo izquierdo al lado del cuerpo. Lleve la banda hacia adelante y [place] a travs del rango de movimiento libre de Social research officer, government. Repita 10 veces por rutina. [place] 1 a 2 sesiones por da.   Strengthening: Resisted Internal Rotation    Sostenga una banda elstica en la mano izquierda, mantenga el codo pegado al cuerpo, y el antebrazo hacia fuera. Rote el antebrazo hacia adentro. Repita 10 veces por rutina. [place] 1 a 2 sesiones por da.  Strengthening: Resisted Extension    Sostenga una banda elstica en la mano derecha. Lleve el brazo hacia atrs con el codo extendido. Repita 10 veces por rutina. [place] 1 a 2 sesiones por da.   Strengthening: Resisted External Rotation    Sostenga una banda elstica en la mano derecha, mantenga el codo pegado al cuerpo, y el antebrazo a travs del mismo. Rote el antebrazo hacia afuera paralelo al piso. Repita 10 veces por rutina. [place] 1 a 2 sesiones por da.

## 2019-01-08 ENCOUNTER — Ambulatory Visit: Payer: Medicaid Other

## 2019-01-08 ENCOUNTER — Other Ambulatory Visit: Payer: Self-pay

## 2019-01-08 DIAGNOSIS — M25612 Stiffness of left shoulder, not elsewhere classified: Secondary | ICD-10-CM | POA: Diagnosis not present

## 2019-01-08 DIAGNOSIS — M25512 Pain in left shoulder: Secondary | ICD-10-CM | POA: Diagnosis not present

## 2019-01-08 DIAGNOSIS — R293 Abnormal posture: Secondary | ICD-10-CM | POA: Diagnosis not present

## 2019-01-08 DIAGNOSIS — G8929 Other chronic pain: Secondary | ICD-10-CM | POA: Diagnosis not present

## 2019-01-08 NOTE — Therapy (Signed)
[place], [place], [address] Phone: [phone]   Fax:  [phone]  Physical Therapy Treatment  Patient Details  Name: Amanda Duarte MRN: [id number] Date of Birth: 03/21/1955 Referring Provider (PT): [name]   Encounter Date: 01/08/2019  PT End of Session - 01/08/19 1220    Visit Number  14    Number of Visits  15    Date for PT Re-Evaluation  01/22/19    Authorization Type  3 visits until 8/25, authorization requested for 12 more visits from [facility] on 8/25 - new auth for 8 visits from 10/15 to 11/11, requesting 6 additional visits at 1x/wk from 11/17 to 12/28    Authorization - Visit Number  5    Authorization - Number of Visits  6   of new auth period   PT Start Time  1110    PT Stop Time  1209    PT Time Calculation (min)  59 min    Activity Tolerance  Patient tolerated treatment well    Behavior During Therapy  [facility] for tasks assessed/performed       Past Medical History:  Diagnosis Date  . Cancer ([place]) 03/2017   left breast cancer  . Diabetes mellitus without complication ([place])   . Hyperlipidemia   . Hypertension   . Malignant neoplasm of left breast ([place])   . Tibial plateau fracture, left    04-13-17 had ORIF    Past Surgical History:  Procedure Laterality Date  . MASTECTOMY Left 2019  . MASTECTOMY MODIFIED RADICAL Left 11/20/2017   Procedure: LEFT MODIFIED RADICAL MASTECTOMY;  Surgeon: Fanny Skates, MD;  Location: [place];  Service: General;  Laterality: Left;  . ORIF TIBIA PLATEAU Left 04/13/2017   Procedure: OPEN REDUCTION INTERNAL FIXATION (ORIF) TIBIAL PLATEAU;  Surgeon: Shona Needles, MD;  Location: [place];  Service: Orthopedics;  Laterality: Left;  . PORT-A-CATH REMOVAL Right 11/20/2017   Procedure: REMOVAL PORT-A-CATH;  Surgeon: Fanny Skates, MD;  Location: [place];  Service: General;  Laterality: Right;  . PORTACATH PLACEMENT Right 05/31/2017   Procedure: INSERTION  PORT-A-CATH;  Surgeon: Fanny Skates, MD;  Location: [place];  Service: General;  Laterality: Right;    There were no vitals filed for this visit.  Subjective Assessment - 01/08/19 1113    Subjective  My Lt chest is still bothering me but now just more at the lateral aspect near my axilla, not the whole chest wall. My pain can wake me up 2-3x/night. But during day my pain is much better, does not go higher than a 3/10 most days.    Pertinent History  Pt presents s/p Lt modified radical mastectomy on 11/20/17 due to triple negative breast cancer. Radiation completed 01/02/18-02/14/18. Chemotherapy completed neoadjuvantly.  Osteopenia, DM, HTN,    Patient Stated Goals  move the arm more    Currently in Pain?  Yes    Pain Score  3     Pain Location  Flank    Pain Orientation  Left    Pain Descriptors / Indicators  Stabbing    Pain Type  Surgical pain    Pain Radiating Towards  axilla and posterior upper arm    Pain Onset  More than a month ago    Pain Frequency  Intermittent    Aggravating Factors   lifting anything heavy, I still don't lift anything heavy; cleaning bathroom and leaning over    Pain Relieving Factors  pain has just  been coming and going more often, at times it is going away on it's own without me doing anything         [facility] PT Assessment - 01/08/19 0001      AROM   Left Shoulder Flexion  138 Degrees    Left Shoulder ABduction  130 Degrees   scaption; pure abduction 90 degrees                  [facility] Adult PT Treatment/Exercise - 01/08/19 0001      Shoulder Exercises: [place]   Flexion  3 minutes    Flexion Limitations  Pt returned therapist demo    ABduction  2 minutes    ABduction Limitations  Pt returned therapist demo and performed this with improved technique today      Manual Therapy   Manual Therapy  Joint mobilization;Soft tissue mobilization;Myofascial release;Passive ROM    Joint Mobilization  L shoulder mobs and  glenohumeral posterior glides grade II    Soft tissue mobilization  gently to pectoralis as pt could tolerate    Myofascial Release  To Lt axilla during P/ROM    Passive ROM  L shoulder flexion, D2, er and pure abduction with end range firm end feel with oscillations and distraction throughout.                   PT Long Term Goals - 12/04/18 0951      PT LONG TERM GOAL #1   Title  Pt will improve Lt shoulder flexion to 145 deg or greater to improve mobility    Baseline  111, on 09/18/2018: 115, 11/15/18- 137, 12/04/18- 136    Time  8    Period  Weeks    Status  On-going      PT LONG TERM GOAL #2   Title  pt will improve Lt shoulder abduction to 145 or greater to improve mobility    Baseline  86,on eval, 90 on 8/25, 11/15/18- 80 unable to go further without moving in to abdcution, 12/04/18- 120 scaption    Time  8    Period  Weeks    Status  On-going      PT LONG TERM GOAL #3   Title  Pt will decrease Lt shoulder pain to 2/10 at the most    Baseline  6/10 on eval, 6/10 on 8/25, 11/15/18- 8/10 when moving arm, 12/04/18- 4/10    Time  8    Period  Weeks    Status  On-going      PT LONG TERM GOAL #4   Title  Pt will have no difficulty getting dressed or undressed due to shoulder pain    Baseline  needs assist to remove shirt on 09/18/2018, 11/15/18- has to use mostly the right arm due to limited ROM but is able to do it herself, 12/04/18- pt is now able to get dressed without difficulty    Time  8    Period  Weeks    Status  Achieved            Plan - 01/08/19 1225    Clinical Impression Statement  Focused on manual therapy today as pt is still very limited at all end ROMs. Despite still be fairly limited with all A/ROMs her measurements have steadily improved each week except minimal change with er (passively, did not remeasure this today but pt was o degrees, now ~7 degrees). She reports has been consistently doing all HEP and is  tolerating these well without  having any increased pain. Next session is her last approved visit (#6) in [place].    Personal Factors and Comorbidities  Time since onset of injury/illness/exacerbation;Comorbidity 1    Comorbidities  radiation history    Examination-Activity Limitations  Lift;Reach Overhead;Dressing    Examination-Participation Restrictions  Yard Work;Cleaning;Community Activity;Laundry    Stability/Clinical Decision Making  Stable/Uncomplicated    Rehab Potential  Excellent    PT Frequency  1x / week    PT Duration  6 weeks    PT Treatment/Interventions  ADLs/Self Care Home Management;Manual techniques;Taping;Passive range of motion;Therapeutic exercise;Biofeedback    PT Next Visit Plan  Next visit is 6 out of 6 approved Medicaid visit (need to update pt about this as well as she had 1 other visit scheduled that shouldn't have been on 12/29 that won't be covered). Need to determine if pt ready for D/C at this time to HEP or if would like to try for renewal in [place]. Reassess goals and finalize/review HEP prn.    PT Home Exercise Plan  [id number], wall stretch for shoulder flexion; modified supine scapular series; [place] with yellow theraband    Consulted and Agree with Plan of Care  Patient       Patient will benefit from skilled therapeutic intervention in order to improve the following deficits and impairments:  Increased fascial restricitons, Pain, Decreased range of motion, Impaired UE functional use, Decreased knowledge of precautions  Visit Diagnosis: Chronic left shoulder pain  Abnormal posture  Stiffness of left shoulder, not elsewhere classified     Problem List Patient Active Problem List   Diagnosis Date Noted  . Mixed hyperglyceridemia 06/27/2018  . Cancer of overlapping sites of left female breast ([place]) 11/20/2017  . Port-A-Cath in place 06/09/2017  . Malignant neoplasm of upper-inner quadrant of left breast in female, estrogen receptor positive ([place]) 05/22/2017  . Closed  fracture of left tibial plateau   . Axillary lymphadenopathy 04/12/2017  . Closed fracture of lateral portion of left tibial plateau 04/11/2017  . Chest pain, neg MI, neg stress Nuc study, possible GI 05/19/2014  . Essential hypertension 05/19/2014  . Type 2 diabetes mellitus with complication, without long-term current use of insulin ([place]) 05/19/2014    Otelia Limes, PTA 01/08/2019, 12:33 PM  [place], [place], [address] Phone: [phone]   Fax:  [phone]  Name: Amanda Duarte MRN: [id number] Date of Birth: [birth year]

## 2019-01-14 ENCOUNTER — Other Ambulatory Visit: Payer: Self-pay

## 2019-01-14 ENCOUNTER — Ambulatory Visit: Payer: Medicaid Other | Attending: Nurse Practitioner | Admitting: Nurse Practitioner

## 2019-01-14 ENCOUNTER — Encounter: Payer: Self-pay | Admitting: Nurse Practitioner

## 2019-01-14 VITALS — BP 136/68 | HR 61 | Temp 98.1°F | Wt 127.0 lb

## 2019-01-14 DIAGNOSIS — F321 Major depressive disorder, single episode, moderate: Secondary | ICD-10-CM

## 2019-01-14 DIAGNOSIS — Z79899 Other long term (current) drug therapy: Secondary | ICD-10-CM | POA: Insufficient documentation

## 2019-01-14 DIAGNOSIS — E1165 Type 2 diabetes mellitus with hyperglycemia: Secondary | ICD-10-CM

## 2019-01-14 DIAGNOSIS — I10 Essential (primary) hypertension: Secondary | ICD-10-CM | POA: Diagnosis not present

## 2019-01-14 DIAGNOSIS — Z853 Personal history of malignant neoplasm of breast: Secondary | ICD-10-CM | POA: Insufficient documentation

## 2019-01-14 DIAGNOSIS — E785 Hyperlipidemia, unspecified: Secondary | ICD-10-CM | POA: Diagnosis not present

## 2019-01-14 DIAGNOSIS — Z9012 Acquired absence of left breast and nipple: Secondary | ICD-10-CM | POA: Diagnosis not present

## 2019-01-14 DIAGNOSIS — Z888 Allergy status to other drugs, medicaments and biological substances status: Secondary | ICD-10-CM | POA: Insufficient documentation

## 2019-01-14 DIAGNOSIS — Z833 Family history of diabetes mellitus: Secondary | ICD-10-CM | POA: Insufficient documentation

## 2019-01-14 DIAGNOSIS — E118 Type 2 diabetes mellitus with unspecified complications: Secondary | ICD-10-CM | POA: Diagnosis not present

## 2019-01-14 DIAGNOSIS — E1142 Type 2 diabetes mellitus with diabetic polyneuropathy: Secondary | ICD-10-CM | POA: Diagnosis not present

## 2019-01-14 DIAGNOSIS — E119 Type 2 diabetes mellitus without complications: Secondary | ICD-10-CM | POA: Diagnosis present

## 2019-01-14 LAB — POCT GLYCOSYLATED HEMOGLOBIN (HGB A1C): Hemoglobin A1C: 7.8 % — AB (ref 4.0–5.6)

## 2019-01-14 LAB — GLUCOSE, POCT (MANUAL RESULT ENTRY): POC Glucose: 155 mg/dl — AB (ref 70–99)

## 2019-01-14 MED ORDER — BLOOD PRESSURE MONITOR DEVI: 0 refills | Status: DC

## 2019-01-14 MED ORDER — LOSARTAN POTASSIUM 50 MG PO TABS: 50.0000 mg | ORAL_TABLET | Freq: Every day | ORAL | 3 refills | Status: DC

## 2019-01-14 MED ORDER — CITALOPRAM HYDROBROMIDE 20 MG PO TABS: 20.0000 mg | ORAL_TABLET | Freq: Every day | ORAL | 3 refills | Status: DC

## 2019-01-14 MED ORDER — HYDROCHLOROTHIAZIDE 25 MG PO TABS: 25.0000 mg | ORAL_TABLET | Freq: Every day | ORAL | 3 refills | Status: DC
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
## 2019-01-15 ENCOUNTER — Other Ambulatory Visit: Payer: Self-pay

## 2019-01-15 ENCOUNTER — Ambulatory Visit: Payer: Medicaid Other

## 2019-01-15 DIAGNOSIS — M25612 Stiffness of left shoulder, not elsewhere classified: Secondary | ICD-10-CM | POA: Diagnosis not present

## 2019-01-15 DIAGNOSIS — R293 Abnormal posture: Secondary | ICD-10-CM

## 2019-01-15 DIAGNOSIS — G8929 Other chronic pain: Secondary | ICD-10-CM | POA: Diagnosis not present

## 2019-01-15 DIAGNOSIS — M25512 Pain in left shoulder: Secondary | ICD-10-CM | POA: Diagnosis not present

## 2019-01-15 LAB — MICROALBUMIN / CREATININE URINE RATIO
Creatinine, Urine: 34.7 mg/dL
Microalb/Creat Ratio: <9 mg/g creat (ref 0–29)
Microalbumin, Urine: <3 ug/mL

## 2019-01-15 LAB — CMP14+EGFR
ALT: 16 IU/L (ref 0–32)
AST: 24 IU/L (ref 0–40)
Albumin/Globulin Ratio: 1.2 (ref 1.2–2.2)
Albumin: 4.4 g/dL (ref 3.8–4.8)
Alkaline Phosphatase: 107 IU/L (ref 39–117)
BUN/Creatinine Ratio: 25 (ref 12–28)
BUN: 15 mg/dL (ref 8–27)
Bilirubin Total: 0.3 mg/dL (ref 0.0–1.2)
CO2: 20 mmol/L (ref 20–29)
Calcium: 9.4 mg/dL (ref 8.7–10.3)
Chloride: 97 mmol/L (ref 96–106)
Creatinine, Ser: 0.6 mg/dL (ref 0.57–1.00)
GFR calc Af Amer: 112 mL/min/{1.73_m2} (ref >59)
GFR calc non Af Amer: 97 mL/min/{1.73_m2} (ref >59)
Globulin, Total: 3.6 g/dL (ref 1.5–4.5)
Glucose: 161 mg/dL — ABNORMAL HIGH (ref 65–99)
Potassium: 3.6 mmol/L (ref 3.5–5.2)
Sodium: 137 mmol/L (ref 134–144)
Total Protein: 8 g/dL (ref 6.0–8.5)

## 2019-01-15 LAB — LIPID PANEL
Chol/HDL Ratio: 3 ratio (ref 0.0–4.4)
Cholesterol, Total: 125 mg/dL (ref 100–199)
HDL: 41 mg/dL (ref >39)
LDL Chol Calc (NIH): 36 mg/dL (ref 0–99)
Triglycerides: 323 mg/dL — ABNORMAL HIGH (ref 0–149)
VLDL Cholesterol Cal: 48 mg/dL — ABNORMAL HIGH (ref 5–40)
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
## 2019-01-16 ENCOUNTER — Other Ambulatory Visit: Payer: Self-pay | Admitting: Nurse Practitioner

## 2019-01-16 DIAGNOSIS — F321 Major depressive disorder, single episode, moderate: Secondary | ICD-10-CM

## 2019-01-16 DIAGNOSIS — E781 Pure hyperglyceridemia: Secondary | ICD-10-CM

## 2019-01-16 DIAGNOSIS — E118 Type 2 diabetes mellitus with unspecified complications: Secondary | ICD-10-CM

## 2019-01-16 DIAGNOSIS — E783 Hyperchylomicronemia: Secondary | ICD-10-CM

## 2019-01-16 MED ORDER — ATORVASTATIN CALCIUM 40 MG PO TABS: 40.0000 mg | ORAL_TABLET | Freq: Every day | ORAL | 2 refills | Status: DC

## 2019-01-16 MED ORDER — FENOFIBRATE 48 MG PO TABS: 48.0000 mg | ORAL_TABLET | Freq: Every day | ORAL | 0 refills | Status: DC

## 2019-01-16 MED ORDER — CITALOPRAM HYDROBROMIDE 20 MG PO TABS: 20.0000 mg | ORAL_TABLET | Freq: Every day | ORAL | 3 refills | Status: DC

## 2019-01-16 MED ORDER — GLIMEPIRIDE 4 MG PO TABS: 8.0000 mg | ORAL_TABLET | Freq: Every day | ORAL | 0 refills | Status: DC
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
## 2019-02-04 ENCOUNTER — Inpatient Hospital Stay: Payer: Medicaid Other | Attending: Hematology

## 2019-02-04 ENCOUNTER — Other Ambulatory Visit: Payer: Self-pay

## 2019-02-04 ENCOUNTER — Inpatient Hospital Stay (HOSPITAL_BASED_OUTPATIENT_CLINIC_OR_DEPARTMENT_OTHER): Payer: Medicaid Other | Admitting: Hematology

## 2019-02-04 VITALS — BP 123/54 | HR 62 | Temp 98.0°F | Resp 16 | Ht 59.0 in | Wt 126.4 lb

## 2019-02-04 DIAGNOSIS — Z170 Estrogen receptor positive status [ER+]: Secondary | ICD-10-CM

## 2019-02-04 DIAGNOSIS — Z7984 Long term (current) use of oral hypoglycemic drugs: Secondary | ICD-10-CM | POA: Diagnosis not present

## 2019-02-04 DIAGNOSIS — M8580 Other specified disorders of bone density and structure, unspecified site: Secondary | ICD-10-CM | POA: Diagnosis not present

## 2019-02-04 DIAGNOSIS — I10 Essential (primary) hypertension: Secondary | ICD-10-CM | POA: Insufficient documentation

## 2019-02-04 DIAGNOSIS — Z79899 Other long term (current) drug therapy: Secondary | ICD-10-CM | POA: Diagnosis not present

## 2019-02-04 DIAGNOSIS — Z171 Estrogen receptor negative status [ER-]: Secondary | ICD-10-CM | POA: Insufficient documentation

## 2019-02-04 DIAGNOSIS — Z923 Personal history of irradiation: Secondary | ICD-10-CM | POA: Insufficient documentation

## 2019-02-04 DIAGNOSIS — E118 Type 2 diabetes mellitus with unspecified complications: Secondary | ICD-10-CM | POA: Diagnosis not present

## 2019-02-04 DIAGNOSIS — C773 Secondary and unspecified malignant neoplasm of axilla and upper limb lymph nodes: Secondary | ICD-10-CM | POA: Diagnosis not present

## 2019-02-04 DIAGNOSIS — E1142 Type 2 diabetes mellitus with diabetic polyneuropathy: Secondary | ICD-10-CM | POA: Insufficient documentation

## 2019-02-04 DIAGNOSIS — C50212 Malignant neoplasm of upper-inner quadrant of left female breast: Secondary | ICD-10-CM

## 2019-02-04 LAB — CMP (CANCER CENTER ONLY)
ALT: 35 U/L (ref 0–44)
AST: 39 U/L (ref 15–41)
Albumin: 4 g/dL (ref 3.5–5.0)
Alkaline Phosphatase: 100 U/L (ref 38–126)
Anion gap: 13 (ref 5–15)
BUN: 15 mg/dL (ref 8–23)
CO2: 24 mmol/L (ref 22–32)
Calcium: 8.9 mg/dL (ref 8.9–10.3)
Chloride: 98 mmol/L (ref 98–111)
Creatinine: 0.72 mg/dL (ref 0.44–1.00)
GFR, Est AFR Am: >60 mL/min (ref >60)
GFR, Estimated: >60 mL/min (ref >60)
Glucose, Bld: 210 mg/dL — ABNORMAL HIGH (ref 70–99)
Potassium: 3.5 mmol/L (ref 3.5–5.1)
Sodium: 135 mmol/L (ref 135–145)
Total Bilirubin: 0.6 mg/dL (ref 0.3–1.2)
Total Protein: 8.5 g/dL — ABNORMAL HIGH (ref 6.5–8.1)

## 2019-02-04 LAB — CBC WITH DIFFERENTIAL (CANCER CENTER ONLY)
Abs Immature Granulocytes: 0.02 10*3/uL (ref 0.00–0.07)
Basophils Absolute: 0 10*3/uL (ref 0.0–0.1)
Basophils Relative: 0 %
Eosinophils Absolute: 2 10*3/uL — ABNORMAL HIGH (ref 0.0–0.5)
Eosinophils Relative: 24 %
HCT: 39.1 % (ref 36.0–46.0)
Hemoglobin: 13.4 g/dL (ref 12.0–15.0)
Immature Granulocytes: 0 %
Lymphocytes Relative: 28 %
Lymphs Abs: 2.3 10*3/uL (ref 0.7–4.0)
MCH: 32.8 pg (ref 26.0–34.0)
MCHC: 34.3 g/dL (ref 30.0–36.0)
MCV: 95.6 fL (ref 80.0–100.0)
Monocytes Absolute: 0.5 10*3/uL (ref 0.1–1.0)
Monocytes Relative: 6 %
Neutro Abs: 3.5 10*3/uL (ref 1.7–7.7)
Neutrophils Relative %: 42 %
Platelet Count: 171 10*3/uL (ref 150–400)
RBC: 4.09 MIL/uL (ref 3.87–5.11)
RDW: 13.2 % (ref 11.5–15.5)
WBC Count: 8.3 10*3/uL (ref 4.0–10.5)
nRBC: 0 % (ref 0.0–0.2)

## 2019-02-05 ENCOUNTER — Encounter: Payer: Self-pay | Admitting: Hematology

## 2019-02-08 ENCOUNTER — Encounter: Payer: Self-pay | Admitting: Nurse Practitioner

## 2019-02-08 ENCOUNTER — Ambulatory Visit: Payer: Medicaid Other | Attending: Nurse Practitioner | Admitting: Nurse Practitioner

## 2019-02-08 ENCOUNTER — Other Ambulatory Visit: Payer: Self-pay

## 2019-02-08 DIAGNOSIS — F321 Major depressive disorder, single episode, moderate: Secondary | ICD-10-CM | POA: Diagnosis not present

## 2019-02-08 DIAGNOSIS — E119 Type 2 diabetes mellitus without complications: Secondary | ICD-10-CM | POA: Insufficient documentation

## 2019-02-08 DIAGNOSIS — I10 Essential (primary) hypertension: Secondary | ICD-10-CM | POA: Diagnosis not present

## 2019-02-08 DIAGNOSIS — Z7984 Long term (current) use of oral hypoglycemic drugs: Secondary | ICD-10-CM | POA: Diagnosis not present

## 2019-02-08 DIAGNOSIS — Z79899 Other long term (current) drug therapy: Secondary | ICD-10-CM | POA: Insufficient documentation

## 2019-02-08 DIAGNOSIS — Z853 Personal history of malignant neoplasm of breast: Secondary | ICD-10-CM | POA: Insufficient documentation

## 2019-02-08 DIAGNOSIS — E118 Type 2 diabetes mellitus with unspecified complications: Secondary | ICD-10-CM | POA: Diagnosis not present

## 2019-02-08 DIAGNOSIS — Z833 Family history of diabetes mellitus: Secondary | ICD-10-CM | POA: Diagnosis not present

## 2019-02-08 DIAGNOSIS — Z9012 Acquired absence of left breast and nipple: Secondary | ICD-10-CM | POA: Insufficient documentation

## 2019-02-08 MED ORDER — JANUMET 50-1000 MG PO TABS: 1.0000 | ORAL_TABLET | Freq: Two times a day (BID) | ORAL | 1 refills | Status: DC

## 2019-02-08 MED ORDER — CITALOPRAM HYDROBROMIDE 20 MG PO TABS: 20.0000 mg | ORAL_TABLET | Freq: Every day | ORAL | 1 refills | Status: DC

## 2019-02-08 NOTE — Progress Notes (Signed)
Virtual Visit via Telephone Note Due to national recommendations of social distancing due to [place] 19, telehealth visit is felt to be most appropriate for this patient at this time.  I discussed the limitations, risks, security and privacy concerns of performing an evaluation and management service by telephone and the availability of in person appointments. I also discussed with the patient that there may be a patient responsible charge related to this service. The patient expressed understanding and agreed to proceed.    I connected with Amanda Duarte on 02/08/19  at   9:50 AM EST  EDT by telephone and verified that I am speaking with the correct person using two identifiers.   Consent I discussed the limitations, risks, security and privacy concerns of performing an evaluation and management service by telephone and the availability of in person appointments. I also discussed with the patient that there may be a patient responsible charge related to this service. The patient expressed understanding and agreed to proceed.   Location of Patient: Private Residence   Location of Provider: [place] [facility] [place] participating in Telemedicine visit: [name] FNP-BC [place] [facility] [place] #[id number]   History of Present Illness: Telemedicine visit for: Depression follow up.  Depression She was prescribed Celexa 20 mg in December for depression. Reports improved mood. Will continue on celexa as prescribed. She denies any thoughts of self harm.  Depression screen [facility] 2/9 02/08/2019 01/14/2019 10/19/2018 06/27/2018 03/27/2018  Decreased Interest 0 0 0 0 -  Down, Depressed, Hopeless 0 0 3 0 0  PHQ - 2 Score 0 0 3 0 0  Altered sleeping - 0 3 0 0  Tired, decreased energy - 0 3 0 0  Change in appetite - 0 0 0 0  Feeling bad or failure about yourself  - 0 3 0 0  Trouble concentrating - 0 3 0 0  Moving slowly or  fidgety/restless - 0 0 0 0  Suicidal thoughts - 0 3 0 0  PHQ-9 Score - 0 18 0 0  Some recent data might be hidden      Past Medical History:  Diagnosis Date  . Cancer ([place]) 03/2017   left breast cancer  . Diabetes mellitus without complication ([place])   . Hyperlipidemia   . Hypertension   . Malignant neoplasm of left breast ([place])   . Tibial plateau fracture, left    04-13-17 had ORIF    Past Surgical History:  Procedure Laterality Date  . MASTECTOMY Left 2019  . MASTECTOMY MODIFIED RADICAL Left 11/20/2017   Procedure: LEFT MODIFIED RADICAL MASTECTOMY;  Surgeon: Fanny Skates, MD;  Location: [place];  Service: General;  Laterality: Left;  . ORIF TIBIA PLATEAU Left 04/13/2017   Procedure: OPEN REDUCTION INTERNAL FIXATION (ORIF) TIBIAL PLATEAU;  Surgeon: Shona Needles, MD;  Location: [place];  Service: Orthopedics;  Laterality: Left;  . PORT-A-CATH REMOVAL Right 11/20/2017   Procedure: REMOVAL PORT-A-CATH;  Surgeon: Fanny Skates, MD;  Location: [place];  Service: General;  Laterality: Right;  . PORTACATH PLACEMENT Right 05/31/2017   Procedure: INSERTION PORT-A-CATH;  Surgeon: Fanny Skates, MD;  Location: [place];  Service: General;  Laterality: Right;    Family History  Problem Relation Age of Onset  . Diabetes Son   . Breast cancer Neg Hx     Social History   Socioeconomic History  . Marital status: Married    Spouse name: Not on  file  . Number of children: Not on file  . Years of education: Not on file  . Highest education level: Not on file  Occupational History  . Not on file  Tobacco Use  . Smoking status: Never Smoker  . Smokeless tobacco: Never Used  Substance and Sexual Activity  . Alcohol use: No  . Drug use: No  . Sexual activity: Yes  Other Topics Concern  . Not on file  Social History Narrative  . Not on file   Social Determinants of Health   Financial Resource Strain:   . Difficulty of Paying Living Expenses: Not on file  Food  Insecurity:   . Worried About Charity fundraiser in the Last Year: Not on file  . Ran Out of Food in the Last Year: Not on file  Transportation Needs:   . Lack of Transportation (Medical): Not on file  . Lack of Transportation (Non-Medical): Not on file  Physical Activity:   . Days of Exercise per Week: Not on file  . Minutes of Exercise per Session: Not on file  Stress:   . Feeling of Stress : Not on file  Social Connections:   . Frequency of Communication with Friends and Family: Not on file  . Frequency of Social Gatherings with Friends and Family: Not on file  . Attends Religious Services: Not on file  . Active Member of Clubs or Organizations: Not on file  . Attends Archivist Meetings: Not on file  . Marital Status: Not on file     Observations/Objective: Awake, alert and oriented x 3   Review of Systems  Constitutional: Negative for fever, malaise/fatigue and weight loss.  HENT: Negative.  Negative for nosebleeds.   Eyes: Negative.  Negative for blurred vision, double vision and photophobia.  Respiratory: Negative.  Negative for cough and shortness of breath.   Cardiovascular: Negative.  Negative for chest pain, palpitations and leg swelling.  Gastrointestinal: Negative.  Negative for heartburn, nausea and vomiting.  Musculoskeletal: Negative.  Negative for myalgias.  Neurological: Negative.  Negative for dizziness, focal weakness, seizures and headaches.  Psychiatric/Behavioral: Positive for depression. Negative for suicidal ideas. The patient does not have insomnia.     Assessment and Plan: Amanda Duarte was seen today for follow-up.  Diagnoses and all orders for this visit:  Current moderate episode of major depressive disorder without prior episode (HCC) -     citalopram (CELEXA) 20 MG tablet; Take 1 tablet (20 mg total) by mouth daily.  Type 2 diabetes mellitus with complication, without long-term current use of insulin (HCC) -     sitaGLIPtin-metformin  (JANUMET) 50-1000 MG tablet; Take 1 tablet by mouth 2 (two) times daily with a meal. Continue blood sugar control as discussed in office today, low carbohydrate diet, and regular physical exercise as tolerated, 150 minutes per week (30 min each day, 5 days per week, or 50 min 3 days per week). Keep blood sugar logs with fasting goal of 90-130 mg/dl, post prandial (after you eat) less than 180.  For Hypoglycemia: BS <60 and Hyperglycemia BS >400; contact the clinic ASAP. Annual eye exams and foot exams are recommended.    Follow Up Instructions Return in about 9 weeks (around 04/12/2019) for DM.     I discussed the assessment and treatment plan with the patient. The patient was provided an opportunity to ask questions and all were answered. The patient agreed with the plan and demonstrated an understanding of the instructions.   The  patient was advised to call back or seek an in-person evaluation if the symptoms worsen or if the condition fails to improve as anticipated.  I provided 12 minutes of non-face-to-face time during this encounter including median intraservice time, reviewing previous notes, labs, imaging, medications and explaining diagnosis and management.  [name], FNP-BC

## 2019-02-10 ENCOUNTER — Encounter: Payer: Self-pay | Admitting: Nurse Practitioner

## 2019-04-15 ENCOUNTER — Other Ambulatory Visit: Payer: Self-pay

## 2019-04-15 ENCOUNTER — Encounter: Payer: Self-pay | Admitting: Nurse Practitioner

## 2019-04-15 ENCOUNTER — Ambulatory Visit: Payer: Medicaid Other | Attending: Nurse Practitioner | Admitting: Nurse Practitioner

## 2019-04-15 VITALS — BP 137/66 | HR 65 | Temp 97.7°F | Ht 59.0 in | Wt 129.0 lb

## 2019-04-15 DIAGNOSIS — Z1211 Encounter for screening for malignant neoplasm of colon: Secondary | ICD-10-CM | POA: Diagnosis not present

## 2019-04-15 DIAGNOSIS — E785 Hyperlipidemia, unspecified: Secondary | ICD-10-CM | POA: Diagnosis not present

## 2019-04-15 DIAGNOSIS — I10 Essential (primary) hypertension: Secondary | ICD-10-CM

## 2019-04-15 DIAGNOSIS — E118 Type 2 diabetes mellitus with unspecified complications: Secondary | ICD-10-CM

## 2019-04-15 DIAGNOSIS — E1165 Type 2 diabetes mellitus with hyperglycemia: Secondary | ICD-10-CM

## 2019-04-15 LAB — POCT GLYCOSYLATED HEMOGLOBIN (HGB A1C): Hemoglobin A1C: 8.8 % — AB (ref 4.0–5.6)

## 2019-04-15 LAB — GLUCOSE, POCT (MANUAL RESULT ENTRY): POC Glucose: 258 mg/dl — AB (ref 70–99)

## 2019-04-15 MED ORDER — JARDIANCE 10 MG PO TABS: 10.0000 mg | ORAL_TABLET | Freq: Every day | ORAL | 3 refills | Status: DC

## 2019-04-15 MED ORDER — GLIMEPIRIDE 4 MG PO TABS: 8.0000 mg | ORAL_TABLET | Freq: Every day | ORAL | 0 refills | Status: DC

## 2019-04-15 NOTE — Progress Notes (Signed)
Assessment & Plan:  Amanda Duarte was seen today for follow-up.  Diagnoses and all orders for this visit:  Type 2 diabetes mellitus with complication, without long-term current use of insulin (HCC) -     Glucose (CBG) -     HgB A1c -     Ambulatory referral to Ophthalmology -     empagliflozin (JARDIANCE) 10 MG TABS tablet; Take 10 mg by mouth daily before breakfast. -     glimepiride (AMARYL) 4 MG tablet; Take 2 tablets (8 mg total) by mouth daily before breakfast. Denies chest pain, shortness of breath, palpitations, lightheadedness, dizziness, headaches or BLE edema.    Essential Hypertension Continue all antihypertensives as prescribed.  Remember to bring in your blood pressure log with you for your follow up appointment.  DASH/Mediterranean Diets are healthier choices for HTN.    Dyslipidemia INSTRUCTIONS: Work on a low fat, heart healthy diet and participate in regular aerobic exercise program by working out at least 150 minutes per week; 5 days a week-30 minutes per day. Avoid red meat/beef/steak,  fried foods. junk foods, sodas, sugary drinks, unhealthy snacking, alcohol and smoking.  Drink at least 80 oz of water per day and monitor your carbohydrate intake daily.     Colon cancer screening -     Fecal occult blood, imunochemical([facility])  Patient has been counseled on age-appropriate routine health concerns for screening and prevention. These are reviewed and up-to-date. Referrals have been placed accordingly. Immunizations are up-to-date or declined.    Subjective:   Chief Complaint  Patient presents with  . Follow-up    Pt. is here for follow up.    HPI Amanda Duarte 64 y.o. female presents to office today for follow up.   Essential Hypertension Well controlled. Taking losartan 50 mg daily and HCTZ 25 mg as prescribed. Denies chest pain, shortness of breath, palpitations, lightheadedness, dizziness, headaches or BLE edema.  BP Readings from Last 3  Encounters:  04/15/19 137/66  02/04/19 (!) 123/54  01/14/19 136/68    DM TYPE 2 A1c up from 7.8  To 8.8 today. Fasting average readings 140-150s. Will start Jardiance 10 mg daily. Currently taking glimepiride 8 mg daily and Janumet 50-1000 mg BID. Denies any symptoms of hypoglycemia. Hyperglycemic symptoms consist of neuropathy for which she has been prescribed gabapentin 100 mg. She is overdue for eye exam. Referral has been placed today.  Lab Results  Component Value Date   HGBA1C 8.8 (A) 04/15/2019    Dyslipidemia  LDL at goal of <70. She is taking atorvastatin 40 mg and tricor 48 mg daily  daily. Denies statin intolerance or myalgias.  Lab Results  Component Value Date   LDLCALC 36 01/14/2019   Review of Systems  Constitutional: Negative for fever, malaise/fatigue and weight loss.  HENT: Negative.  Negative for nosebleeds.   Eyes: Negative.  Negative for blurred vision, double vision and photophobia.  Respiratory: Negative.  Negative for cough and shortness of breath.   Cardiovascular: Negative.  Negative for chest pain, palpitations and leg swelling.  Gastrointestinal: Negative.  Negative for heartburn, nausea and vomiting.  Musculoskeletal: Negative.  Negative for myalgias.  Neurological: Negative.  Negative for dizziness, focal weakness, seizures and headaches.  Psychiatric/Behavioral: Negative.  Negative for suicidal ideas.    Past Medical History:  Diagnosis Date  . Cancer ([place]) 03/2017   left breast cancer  . Diabetes mellitus without complication ([place])   . Hyperlipidemia   . Hypertension   . Malignant neoplasm of left breast ([place])   .  Tibial plateau fracture, left    04-13-17 had ORIF    Past Surgical History:  Procedure Laterality Date  . MASTECTOMY Left 2019  . MASTECTOMY MODIFIED RADICAL Left 11/20/2017   Procedure: LEFT MODIFIED RADICAL MASTECTOMY;  Surgeon: Fanny Skates, MD;  Location: [place];  Service: General;  Laterality: Left;  . ORIF TIBIA PLATEAU  Left 04/13/2017   Procedure: OPEN REDUCTION INTERNAL FIXATION (ORIF) TIBIAL PLATEAU;  Surgeon: Shona Needles, MD;  Location: [place];  Service: Orthopedics;  Laterality: Left;  . PORT-A-CATH REMOVAL Right 11/20/2017   Procedure: REMOVAL PORT-A-CATH;  Surgeon: Fanny Skates, MD;  Location: [place];  Service: General;  Laterality: Right;  . PORTACATH PLACEMENT Right 05/31/2017   Procedure: INSERTION PORT-A-CATH;  Surgeon: Fanny Skates, MD;  Location: [place];  Service: General;  Laterality: Right;    Family History  Problem Relation Age of Onset  . Diabetes Son   . Breast cancer Neg Hx     Social History Reviewed with no changes to be made today.   Outpatient Medications Prior to Visit  Medication Sig Dispense Refill  . ACCU-CHEK FASTCLIX LANCETS MISC Use as instructed. 100 each 12  . atorvastatin (LIPITOR) 40 MG tablet Take 1 tablet (40 mg total) by mouth daily. 90 tablet 2  . Blood Glucose Monitoring Suppl (ACCU-CHEK GUIDE ME) w/Device KIT 1 kit by Does not apply route daily. 1 kit 0  . Blood Pressure Monitor DEVI Please provide patient with insurance approved blood pressure monitor 1 each 0  . citalopram (CELEXA) 20 MG tablet Take 1 tablet (20 mg total) by mouth daily. 90 tablet 1  . fenofibrate (TRICOR) 48 MG tablet Take 1 tablet (48 mg total) by mouth daily. 90 tablet 0  . glucose blood (ACCU-CHEK GUIDE) test strip Use as instructed 100 each 11  . hydrochlorothiazide (HYDRODIURIL) 25 MG tablet Take 1 tablet (25 mg total) by mouth daily. 90 tablet 3  . losartan (COZAAR) 50 MG tablet Take 1 tablet (50 mg total) by mouth daily. 90 tablet 3  . sitaGLIPtin-metformin (JANUMET) 50-1000 MG tablet Take 1 tablet by mouth 2 (two) times daily with a meal. 180 tablet 1  . glimepiride (AMARYL) 4 MG tablet Take 2 tablets (8 mg total) by mouth daily before breakfast. 180 tablet 0  . gabapentin (NEURONTIN) 100 MG capsule Take 1 capsule (100 mg total) by mouth at bedtime. May  gradually increase to 3 capsules at night (Patient not taking: Reported on 04/15/2019) 90 capsule 1  . lidocaine-prilocaine (EMLA) cream Apply to affected area once (Patient not taking: Reported on 04/15/2019) 30 g 3  . senna (SENOKOT) 8.6 MG TABS tablet Take 1 tablet (8.6 mg total) by mouth daily as needed for mild constipation. (Patient not taking: Reported on 04/15/2019) 10 each 0   No facility-administered medications prior to visit.    Allergies  Allergen Reactions  . Victoza [Liraglutide] Nausea And Vomiting       Objective:    BP 137/66 (BP Location: Right Arm, Patient Position: Sitting, Cuff Size: Normal)   Pulse 65   Temp 97.7 F (36.5 C) (Temporal)   Ht _0  (1.499 m)   Wt 129 lb (58.5 kg)   SpO2 98%   BMI 26.05 kg/m  Wt Readings from Last 3 Encounters:  04/15/19 129 lb (58.5 kg)  02/04/19 126 lb 6.4 oz (57.3 kg)  01/14/19 127 lb (57.6 kg)    Physical Exam Vitals and nursing note reviewed.  Constitutional:  Appearance: She is well-developed.  HENT:     Head: Normocephalic and atraumatic.  Cardiovascular:     Rate and Rhythm: Normal rate and regular rhythm.     Heart sounds: Normal heart sounds. No murmur. No friction rub. No gallop.   Pulmonary:     Effort: Pulmonary effort is normal. No tachypnea or respiratory distress.     Breath sounds: Normal breath sounds. No decreased breath sounds, wheezing, rhonchi or rales.  Chest:     Chest wall: No tenderness.  Abdominal:     General: Bowel sounds are normal.     Palpations: Abdomen is soft.  Musculoskeletal:        General: Normal range of motion.     Cervical back: Normal range of motion.  Skin:    General: Skin is warm and dry.  Neurological:     Mental Status: She is alert and oriented to person, place, and time.     Coordination: Coordination normal.  Psychiatric:        Behavior: Behavior normal. Behavior is cooperative.        Thought Content: Thought content normal.        Judgment: Judgment  normal.          Patient has been counseled extensively about nutrition and exercise as well as the importance of adherence with medications and regular follow-up. The patient was given clear instructions to go to ER or return to medical center if symptoms don't improve, worsen or new problems develop. The patient verbalized understanding.   Follow-up: Return for 6 weeks [place]; then see me in June .   [name], FNP-BC [facility] [place], [place]   04/15/2019, 1:20 PM

## 2019-05-27 ENCOUNTER — Encounter: Payer: Self-pay | Admitting: Pharmacist

## 2019-05-27 ENCOUNTER — Other Ambulatory Visit: Payer: Self-pay

## 2019-05-27 ENCOUNTER — Ambulatory Visit: Payer: Medicaid Other | Attending: Nurse Practitioner | Admitting: Pharmacist

## 2019-05-27 DIAGNOSIS — Z79899 Other long term (current) drug therapy: Secondary | ICD-10-CM | POA: Insufficient documentation

## 2019-05-27 DIAGNOSIS — E118 Type 2 diabetes mellitus with unspecified complications: Secondary | ICD-10-CM

## 2019-05-27 DIAGNOSIS — E785 Hyperlipidemia, unspecified: Secondary | ICD-10-CM | POA: Insufficient documentation

## 2019-05-27 DIAGNOSIS — I10 Essential (primary) hypertension: Secondary | ICD-10-CM | POA: Insufficient documentation

## 2019-05-27 DIAGNOSIS — Z7984 Long term (current) use of oral hypoglycemic drugs: Secondary | ICD-10-CM | POA: Insufficient documentation

## 2019-05-27 DIAGNOSIS — E1165 Type 2 diabetes mellitus with hyperglycemia: Secondary | ICD-10-CM | POA: Diagnosis not present

## 2019-05-27 DIAGNOSIS — Z1211 Encounter for screening for malignant neoplasm of colon: Secondary | ICD-10-CM | POA: Diagnosis not present

## 2019-05-27 NOTE — Progress Notes (Signed)
    S:    PCP: [facility]da   No chief complaint on file.  Patient arrives in good spirits.  Presents for diabetes evaluation, education, and management. Patient was referred and last seen by her PCP on 04/15/2019.    Family/Social History:  - FHx: DM (son) - Tobacco: never smoker  - Alcohol: denies use   Insurance coverage/medication affordability: [place] Medicaid   Patient reports adherence with medications.  Current diabetes medications include: glimepiride 8 mg daily, Jardiance 10 mg daily, Janumet 50-1000 mg BID Current hypertension medications include: HCTZ 25 mg daily, losartan 50 mg daily  Current hyperlipidemia medications include: atorvastatin 40 mg daily, fenofibrate 48 mg daily  Patient denies hypoglycemic events.  Patient reported dietary habits:  - cutting out sugary beverages - stopped drinking coffee with milk    Patient-reported exercise habits: PT for left arm   Patient denies nocturia. Patient reports neuropathy but chemotherapy is contributing to this.  Patient denies visual changes. Patient reports self foot exams.    O:  POCT glucose: 141  Lab Results  Component Value Date   HGBA1C 8.8 (A) 04/15/2019   There were no vitals filed for this visit.  Lipid Panel     Component Value Date/Time   CHOL 125 01/14/2019 0945   TRIG 323 (H) 01/14/2019 0945   HDL 41 01/14/2019 0945   CHOLHDL 3.0 01/14/2019 0945   CHOLHDL 4.6 05/19/2014 0625   VLDL 54 (H) 05/19/2014 0625   LDLCALC 36 01/14/2019 0945   Home blood sugar range: 130-150   Clinical Atherosclerotic Cardiovascular Disease (ASCVD): No  The ASCVD Risk score Mikey Bussing DC Jr., et al., 2013) failed to calculate for the following reasons:   The valid total cholesterol range is 130 to 320 mg/dL    A/P: Diabetes longstanding currently uncontrolled but home sugar levels reveal improvement. Patient is able to verbalize appropriate hypoglycemia management plan. Patient is adherent with medication.  -Continued  current regimen. -Extensively discussed pathophysiology of diabetes, recommended lifestyle interventions, dietary effects on blood sugar control -Counseled on s/sx of and management of hypoglycemia -Next A1C anticipated 12/2018.   ASCVD risk - primary prevention in patient with diabetes. Last LDL is controlled. Continue atorvastatin 40 mg daily.  -Continued atorvastatin 40 mg.  HM: UTD on PNA, tetanus, and influenza vaccines.   Written patient instructions provided.  Total time in face to face counseling 20 minutes.   Follow up PCP Clinic Visit in June.   Benard Halsted, PharmD, [place] [phone]

## 2019-05-29 LAB — FECAL OCCULT BLOOD, IMMUNOCHEMICAL: Fecal Occult Bld: NEGATIVE
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
## 2019-06-05 ENCOUNTER — Inpatient Hospital Stay: Payer: Medicaid Other

## 2019-06-05 ENCOUNTER — Inpatient Hospital Stay: Payer: Medicaid Other | Attending: Hematology | Admitting: Hematology

## 2019-06-05 ENCOUNTER — Other Ambulatory Visit: Payer: Self-pay

## 2019-06-05 ENCOUNTER — Telehealth: Payer: Self-pay | Admitting: Hematology

## 2019-06-05 ENCOUNTER — Encounter: Payer: Self-pay | Admitting: Hematology

## 2019-06-05 VITALS — BP 142/53 | HR 63 | Temp 97.2°F | Resp 18 | Ht 59.0 in | Wt 129.9 lb

## 2019-06-05 DIAGNOSIS — Z853 Personal history of malignant neoplasm of breast: Secondary | ICD-10-CM | POA: Diagnosis not present

## 2019-06-05 DIAGNOSIS — Z170 Estrogen receptor positive status [ER+]: Secondary | ICD-10-CM

## 2019-06-05 DIAGNOSIS — G629 Polyneuropathy, unspecified: Secondary | ICD-10-CM | POA: Insufficient documentation

## 2019-06-05 DIAGNOSIS — Z1231 Encounter for screening mammogram for malignant neoplasm of breast: Secondary | ICD-10-CM | POA: Diagnosis not present

## 2019-06-05 DIAGNOSIS — I10 Essential (primary) hypertension: Secondary | ICD-10-CM | POA: Diagnosis not present

## 2019-06-05 DIAGNOSIS — E1165 Type 2 diabetes mellitus with hyperglycemia: Secondary | ICD-10-CM | POA: Insufficient documentation

## 2019-06-05 DIAGNOSIS — E118 Type 2 diabetes mellitus with unspecified complications: Secondary | ICD-10-CM

## 2019-06-05 DIAGNOSIS — C50212 Malignant neoplasm of upper-inner quadrant of left female breast: Secondary | ICD-10-CM

## 2019-06-05 DIAGNOSIS — R1032 Left lower quadrant pain: Secondary | ICD-10-CM | POA: Insufficient documentation

## 2019-06-05 DIAGNOSIS — M8580 Other specified disorders of bone density and structure, unspecified site: Secondary | ICD-10-CM | POA: Insufficient documentation

## 2019-06-05 DIAGNOSIS — Z86010 Personal history of colonic polyps: Secondary | ICD-10-CM | POA: Insufficient documentation

## 2019-06-05 DIAGNOSIS — Z8781 Personal history of (healed) traumatic fracture: Secondary | ICD-10-CM | POA: Diagnosis not present

## 2019-06-05 DIAGNOSIS — Z9012 Acquired absence of left breast and nipple: Secondary | ICD-10-CM | POA: Insufficient documentation

## 2019-06-05 DIAGNOSIS — Z923 Personal history of irradiation: Secondary | ICD-10-CM | POA: Insufficient documentation

## 2019-06-05 LAB — CBC WITH DIFFERENTIAL (CANCER CENTER ONLY)
Abs Immature Granulocytes: 0.04 10*3/uL (ref 0.00–0.07)
Basophils Absolute: 0.1 10*3/uL (ref 0.0–0.1)
Basophils Relative: 1 %
Eosinophils Absolute: 2.2 10*3/uL — ABNORMAL HIGH (ref 0.0–0.5)
Eosinophils Relative: 25 %
HCT: 41.3 % (ref 36.0–46.0)
Hemoglobin: 13.7 g/dL (ref 12.0–15.0)
Immature Granulocytes: 0 %
Lymphocytes Relative: 21 %
Lymphs Abs: 1.9 10*3/uL (ref 0.7–4.0)
MCH: 33 pg (ref 26.0–34.0)
MCHC: 33.2 g/dL (ref 30.0–36.0)
MCV: 99.5 fL (ref 80.0–100.0)
Monocytes Absolute: 0.7 10*3/uL (ref 0.1–1.0)
Monocytes Relative: 7 %
Neutro Abs: 4.1 10*3/uL (ref 1.7–7.7)
Neutrophils Relative %: 46 %
Platelet Count: 166 10*3/uL (ref 150–400)
RBC: 4.15 MIL/uL (ref 3.87–5.11)
RDW: 13.8 % (ref 11.5–15.5)
WBC Count: 9 10*3/uL (ref 4.0–10.5)
nRBC: 0 % (ref 0.0–0.2)

## 2019-06-05 LAB — CMP (CANCER CENTER ONLY)
ALT: 17 U/L (ref 0–44)
AST: 21 U/L (ref 15–41)
Albumin: 4.1 g/dL (ref 3.5–5.0)
Alkaline Phosphatase: 101 U/L (ref 38–126)
Anion gap: 12 (ref 5–15)
BUN: 14 mg/dL (ref 8–23)
CO2: 25 mmol/L (ref 22–32)
Calcium: 9.6 mg/dL (ref 8.9–10.3)
Chloride: 103 mmol/L (ref 98–111)
Creatinine: 0.75 mg/dL (ref 0.44–1.00)
GFR, Est AFR Am: >60 mL/min (ref >60)
GFR, Estimated: >60 mL/min (ref >60)
Glucose, Bld: 206 mg/dL — ABNORMAL HIGH (ref 70–99)
Potassium: 4 mmol/L (ref 3.5–5.1)
Sodium: 140 mmol/L (ref 135–145)
Total Bilirubin: 0.4 mg/dL (ref 0.3–1.2)
Total Protein: 8.4 g/dL — ABNORMAL HIGH (ref 6.5–8.1)

## 2019-06-05 NOTE — Telephone Encounter (Signed)
Scheduled per los. Gave avs and calendar  

## 2019-06-26 ENCOUNTER — Other Ambulatory Visit: Payer: Self-pay

## 2019-06-26 ENCOUNTER — Ambulatory Visit: Payer: Medicaid Other | Admitting: Physician Assistant

## 2019-06-26 VITALS — BP 142/64 | HR 70 | Temp 98.2°F | Resp 18 | Ht 59.0 in

## 2019-06-26 DIAGNOSIS — N3000 Acute cystitis without hematuria: Secondary | ICD-10-CM

## 2019-06-26 DIAGNOSIS — B379 Candidiasis, unspecified: Secondary | ICD-10-CM | POA: Diagnosis not present

## 2019-06-26 DIAGNOSIS — R1032 Left lower quadrant pain: Secondary | ICD-10-CM

## 2019-06-26 LAB — POCT URINALYSIS DIP (CLINITEK)
Bilirubin, UA: NEGATIVE
Blood, UA: NEGATIVE
Glucose, UA: >=1000 mg/dL — AB
Ketones, POC UA: NEGATIVE mg/dL
Leukocytes, UA: NEGATIVE
Nitrite, UA: NEGATIVE
POC PROTEIN,UA: NEGATIVE
Spec Grav, UA: >=1.03 — AB (ref 1.010–1.025)
Urobilinogen, UA: 0.2 E.U./dL
pH, UA: 5 (ref 5.0–8.0)

## 2019-06-26 MED ORDER — FLUCONAZOLE 150 MG PO TABS: 150.0000 mg | ORAL_TABLET | Freq: Once | ORAL | 0 refills | Status: AC

## 2019-06-26 MED ORDER — SULFAMETHOXAZOLE-TRIMETHOPRIM 800-160 MG PO TABS: 1.0000 | ORAL_TABLET | Freq: Two times a day (BID) | ORAL | 0 refills | Status: AC

## 2019-06-26 NOTE — Addendum Note (Signed)
Addended by: Trecia Rogers on: 06/26/2019 05:01 PM   Modules accepted: Orders

## 2019-06-26 NOTE — Progress Notes (Signed)
Established Patient Office Visit  Subjective:  Patient ID: Amanda Duarte, female    DOB: 05-Feb-1955  Age: 64 y.o. MRN: [id number]  CC:  Chief Complaint  Patient presents with  . Abdominal Pain    HPI Amanda Duarte reports that she has been having left lower quadrant abdominal pain for the last 4 days.  Describes pain as stabbing, states skin feels hot to the touch.  Endorses lower left CVA tenderness.  Endorses burning with urination.  Denies fever, chills.  Reports that she is able to eat and drink, bowel movements have been normal soft formed.  Reports resting makes it better.  Reports that she gets this pain frequently, reports that she has previously had a colonoscopy that did show polyps, is unsure of how long ago this was.   Significant history of left-sided breast cancer, was last seen by oncology on Jun 05, 2019, did have complaint of left lower quadrant abdominal pain at that time, was encouraged to follow-up with gastroenterology, patient states that she has not been able to get an appointment.   Due to language barrier, an interpreter was present during the history-taking and subsequent discussion (and for part of the physical exam) with this patient.   Past Medical History:  Diagnosis Date  . Cancer ([place]) 03/2017   left breast cancer  . Diabetes mellitus without complication ([place])   . Hyperlipidemia   . Hypertension   . Malignant neoplasm of left breast ([place])   . Tibial plateau fracture, left    04-13-17 had ORIF    Past Surgical History:  Procedure Laterality Date  . MASTECTOMY Left 2019  . MASTECTOMY MODIFIED RADICAL Left 11/20/2017   Procedure: LEFT MODIFIED RADICAL MASTECTOMY;  Surgeon: Fanny Skates, MD;  Location: [place];  Service: General;  Laterality: Left;  . ORIF TIBIA PLATEAU Left 04/13/2017   Procedure: OPEN REDUCTION INTERNAL FIXATION (ORIF) TIBIAL PLATEAU;  Surgeon: Shona Needles, MD;  Location: [place];  Service: Orthopedics;  Laterality:  Left;  . PORT-A-CATH REMOVAL Right 11/20/2017   Procedure: REMOVAL PORT-A-CATH;  Surgeon: Fanny Skates, MD;  Location: [place];  Service: General;  Laterality: Right;  . PORTACATH PLACEMENT Right 05/31/2017   Procedure: INSERTION PORT-A-CATH;  Surgeon: Fanny Skates, MD;  Location: [facility];  Service: General;  Laterality: Right;    Family History  Problem Relation Age of Onset  . Diabetes Son   . Breast cancer Neg Hx     Social History   Socioeconomic History  . Marital status: Married    Spouse name: Not on file  . Number of children: Not on file  . Years of education: Not on file  . Highest education level: Not on file  Occupational History  . Not on file  Tobacco Use  . Smoking status: Never Smoker  . Smokeless tobacco: Never Used  Substance and Sexual Activity  . Alcohol use: No  . Drug use: No  . Sexual activity: Yes  Other Topics Concern  . Not on file  Social History Narrative  . Not on file   Social Determinants of Health   Financial Resource Strain:   . Difficulty of Paying Living Expenses:   Food Insecurity:   . Worried About Charity fundraiser in the Last Year:   . Arboriculturist in the Last Year:   Transportation Needs:   . Film/video editor (Medical):   [name] Lack of Transportation (Non-Medical):   Physical Activity:   . Days  of Exercise per Week:   . Minutes of Exercise per Session:   Stress:   . Feeling of Stress :   Social Connections:   . Frequency of Communication with Friends and Family:   . Frequency of Social Gatherings with Friends and Family:   . Attends Religious Services:   . Active Member of Clubs or Organizations:   . Attends Archivist Meetings:   [name] Marital Status:   Intimate Partner Violence:   . Fear of Current or Ex-Partner:   . Emotionally Abused:   [name] Physically Abused:   . Sexually Abused:     Outpatient Medications Prior to Visit  Medication Sig Dispense Refill  . ACCU-CHEK FASTCLIX  LANCETS MISC Use as instructed. 100 each 12  . atorvastatin (LIPITOR) 40 MG tablet Take 1 tablet (40 mg total) by mouth daily. 90 tablet 2  . Blood Glucose Monitoring Suppl (ACCU-CHEK GUIDE ME) w/Device KIT 1 kit by Does not apply route daily. 1 kit 0  . Blood Pressure Monitor DEVI Please provide patient with insurance approved blood pressure monitor 1 each 0  . citalopram (CELEXA) 20 MG tablet Take 1 tablet (20 mg total) by mouth daily. 90 tablet 1  . empagliflozin (JARDIANCE) 10 MG TABS tablet Take 10 mg by mouth daily before breakfast. 30 tablet 3  . fenofibrate (TRICOR) 48 MG tablet Take 1 tablet (48 mg total) by mouth daily. 90 tablet 0  . glimepiride (AMARYL) 4 MG tablet Take 2 tablets (8 mg total) by mouth daily before breakfast. 180 tablet 0  . glucose blood (ACCU-CHEK GUIDE) test strip Use as instructed 100 each 11  . hydrochlorothiazide (HYDRODIURIL) 25 MG tablet Take 1 tablet (25 mg total) by mouth daily. 90 tablet 3  . losartan (COZAAR) 50 MG tablet Take 1 tablet (50 mg total) by mouth daily. 90 tablet 3  . sitaGLIPtin-metformin (JANUMET) 50-1000 MG tablet Take 1 tablet by mouth 2 (two) times daily with a meal. 180 tablet 1   No facility-administered medications prior to visit.    Allergies  Allergen Reactions  . Victoza [Liraglutide] Nausea And Vomiting    ROS Review of Systems  Constitutional: Negative for chills, fatigue and fever.  HENT: Negative.   Eyes: Negative.   Respiratory: Negative.   Cardiovascular: Negative.   Gastrointestinal: Positive for abdominal pain. Negative for blood in stool, constipation, diarrhea, nausea and vomiting.  Endocrine: Negative.   Genitourinary: Positive for dysuria and frequency.  Musculoskeletal: Negative.   Allergic/Immunologic: Negative.   Neurological: Negative.   Hematological: Negative.   Psychiatric/Behavioral: Negative.       Objective:    Physical Exam  Constitutional: She is oriented to person, place, and time. She  appears well-developed and well-nourished. No distress.  Eyes: Pupils are equal, round, and reactive to light. Conjunctivae and EOM are normal.  Cardiovascular: Normal rate and regular rhythm.  Pulmonary/Chest: Effort normal and breath sounds normal.  Abdominal: Soft. Normal appearance and bowel sounds are normal. There is abdominal tenderness in the left lower quadrant. There is CVA tenderness.    Musculoskeletal:        General: Normal range of motion.     Cervical back: Normal range of motion and neck supple.  Neurological: She is alert and oriented to person, place, and time.  Skin: Skin is warm and dry. She is not diaphoretic.  Psychiatric: She has a normal mood and affect. Her behavior is normal. Judgment and thought content normal.  Nursing note and vitals reviewed.  BP (!) 142/64 (BP Location: Left Arm, Patient Position: Sitting, Cuff Size: Normal)   Pulse 70   Temp 98.2 F (36.8 C) (Oral)   Resp 18   Ht _0  (1.499 m)   SpO2 96%   BMI 26.24 kg/m  Wt Readings from Last 3 Encounters:  06/05/19 129 lb 14.4 oz (58.9 kg)  04/15/19 129 lb (58.5 kg)  02/04/19 126 lb 6.4 oz (57.3 kg)     Health Maintenance Due  Topic Date Due  . OPHTHALMOLOGY EXAM  Never done  . COVID-19 Vaccine (1) Never done  . FOOT EXAM  03/27/2019    There are no preventive care reminders to display for this patient.  Lab Results  Component Value Date   TSH 1.319 05/19/2014   Lab Results  Component Value Date   WBC 9.0 06/05/2019   HGB 13.7 06/05/2019   HCT 41.3 06/05/2019   MCV 99.5 06/05/2019   PLT 166 06/05/2019   Lab Results  Component Value Date   NA 140 06/05/2019   K 4.0 06/05/2019   CO2 25 06/05/2019   GLUCOSE 206 (H) 06/05/2019   BUN 14 06/05/2019   CREATININE 0.75 06/05/2019   BILITOT 0.4 06/05/2019   ALKPHOS 101 06/05/2019   AST 21 06/05/2019   ALT 17 06/05/2019   PROT 8.4 (H) 06/05/2019   ALBUMIN 4.1 06/05/2019   CALCIUM 9.6 06/05/2019   ANIONGAP 12 06/05/2019    Lab Results  Component Value Date   CHOL 125 01/14/2019   Lab Results  Component Value Date   HDL 41 01/14/2019   Lab Results  Component Value Date   LDLCALC 36 01/14/2019   Lab Results  Component Value Date   TRIG 323 (H) 01/14/2019   Lab Results  Component Value Date   CHOLHDL 3.0 01/14/2019   Lab Results  Component Value Date   HGBA1C 8.8 (A) 04/15/2019      Assessment & Plan:   Problem List Items Addressed This Visit    None    Visit Diagnoses    Abdominal pain, left lower quadrant    -  Primary   Relevant Orders   DG Abd 2 Views   Ambulatory referral to Gastroenterology   Acute cystitis without hematuria       Relevant Medications   sulfamethoxazole-trimethoprim (BACTRIM DS) 800-160 MG tablet   Other Relevant Orders   Urine Culture   Yeast infection       Relevant Medications   sulfamethoxazole-trimethoprim (BACTRIM DS) 800-160 MG tablet   fluconazole (DIFLUCAN) 150 MG tablet      Meds ordered this encounter  Medications  . sulfamethoxazole-trimethoprim (BACTRIM DS) 800-160 MG tablet    Sig: Take 1 tablet by mouth 2 (two) times daily for 5 days.    Dispense:  10 tablet    Refill:  0    Order Specific Question:   Supervising Provider    Answer:   Joya Gaskins, PATRICK E [[id number]]  . fluconazole (DIFLUCAN) 150 MG tablet    Sig: Take 1 tablet (150 mg total) by mouth once for 1 dose.    Dispense:  1 tablet    Refill:  0    Order Specific Question:   Supervising Provider    Answer:   WRIGHT, PATRICK E [[id number]]  1. Abdominal pain, left lower quadrant Patient has had several different complaints of left lower quadrant abdominal pain according to patient history, further review warranted - DG Abd 2 Views; Future - Ambulatory referral to Gastroenterology  2. Acute cystitis without hematuria UA was negative for nitrites and leukocytes, positive for glucose, given frequency, dysuria, CVA tenderness, will treat for UTI, send for culture  Encouraged increased  hydration, rest, red flags given for prompt evaluation at emergency department - sulfamethoxazole-trimethoprim (BACTRIM DS) 800-160 MG tablet; Take 1 tablet by mouth 2 (two) times daily for 5 days.  Dispense: 10 tablet; Refill: 0 - Urine Culture  3. Yeast infection Given UA positive for glucose, complaints of dysuria, will treat for yeast infection, urine sent for culture - fluconazole (DIFLUCAN) 150 MG tablet; Take 1 tablet (150 mg total) by mouth once for 1 dose.  Dispense: 1 tablet; Refill: 0   I have reviewed the patient's medical history (PMH, PSH, Social History, Family History, Medications, and allergies) , and have been updated if relevant. I spent 20 minutes reviewing chart and  face to face time with patient.     Follow-up: Return if symptoms worsen or fail to improve.    Loraine Grip Mayers, PA-C

## 2019-06-26 NOTE — Patient Instructions (Signed)
We are treating you for a urinary tract infection as well as a yeast infection.  You will take Bactrim twice a day for 5 days, and you will take Diflucan once.  Increase your water intake, and get plenty of rest.  I also ordered an x-ray of your abdomen, you will need to go to [facility] to get this completed.  We will call you with the results.  I also started a referral for you to be seen at gastroenterology for further review of your abdominal pain.  I hope that you feel better soon   Kennieth Rad, PA-C Physician Assistant [place] [email or web address]   Dolor abdominal en los adultos Abdominal Pain, Adult El dolor en el abdomen (dolor abdominal) puede tener muchas causas. A menudo, el dolor abdominal no es grave y [place] sin tratamiento o con tratamiento en la casa. Sin embargo, a Product/process development scientist abdominal es grave. El mdico le har preguntas sobre sus antecedentes mdicos y le har un examen fsico para tratar de Office manager causa del dolor abdominal. Siga estas instrucciones en su casa:  Medicamentos  Use los medicamentos de venta libre y los recetados solamente como se lo haya indicado el mdico.  No tome un laxante a menos que se lo haya indicado el mdico. Instrucciones generales  Controle su afeccin para detectar cualquier cambio.  Beba suficiente lquido como para Theatre manager la orina de color amarillo plido.  Concurra a todas las visitas de seguimiento como se lo haya indicado el mdico. Esto es importante. Comunquese con un mdico si:  El dolor abdominal cambia o empeora.  No tiene apetito o baja de peso sin proponrselo.  Est estreido o tiene diarrea durante ms de 2 o 3das.  Tiene dolor cuando orina o defeca.  El dolor abdominal lo despierta de noche.  El dolor empeora con las comidas, despus de comer o con determinados alimentos.  Tiene vmitos y no puede retener nada de lo que  ingiere.  Tiene fiebre.  Observa sangre en la orina. Solicite ayuda inmediatamente si:  El dolor no desaparece tan pronto como el mdico le dijo que era esperable.  No puede dejar de vomitar.  El Social research officer, government se siente solo en zonas del abdomen, como el lado derecho o la parte inferior izquierda del abdomen. Si se localiza en la zona derecha, posiblemente podra tratarse de apendicitis.  Las heces son sanguinolentas o de color negro, o de aspecto alquitranado.  Tiene dolor intenso, clicos o distensin abdominal.  Tiene signos de deshidratacin, como los siguientes: ? Orina de color oscuro, muy escasa o falta de orina. ? Labios agrietados. ? Sequedad de boca. ? Ojos hundidos. ? Somnolencia. ? Debilidad.  Tiene dificultad para respirar o Tourist information centre manager. Resumen  A menudo, el dolor abdominal no es grave y [place] sin tratamiento o con tratamiento en la casa. Sin embargo, a Product/process development scientist abdominal es grave.  Controle su afeccin para Actuary cambio.  Use los medicamentos de venta libre y los recetados solamente como se lo haya indicado el mdico.  Comunquese con un mdico si el dolor abdominal cambia o [place].  Busque ayuda de inmediato si tiene dolor intenso, clicos o distensin abdominal. Esta informacin no tiene Marine scientist el consejo del mdico. Asegrese de hacerle al mdico cualquier pregunta que tenga. Document Revised: 07/18/2018 Document Reviewed: 07/18/2018 Elsevier Patient Education  [place].

## 2019-06-27 ENCOUNTER — Encounter: Payer: Self-pay | Admitting: Gastroenterology

## 2019-06-29 LAB — URINE CULTURE

## 2019-07-16 ENCOUNTER — Other Ambulatory Visit: Payer: Self-pay

## 2019-07-16 ENCOUNTER — Telehealth: Payer: Self-pay

## 2019-07-16 ENCOUNTER — Other Ambulatory Visit: Payer: Self-pay | Admitting: Nurse Practitioner

## 2019-07-16 ENCOUNTER — Encounter: Payer: Self-pay | Admitting: Nurse Practitioner

## 2019-07-16 ENCOUNTER — Ambulatory Visit: Payer: Medicaid Other | Attending: Nurse Practitioner | Admitting: Nurse Practitioner

## 2019-07-16 DIAGNOSIS — Z79899 Other long term (current) drug therapy: Secondary | ICD-10-CM | POA: Diagnosis not present

## 2019-07-16 DIAGNOSIS — I10 Essential (primary) hypertension: Secondary | ICD-10-CM | POA: Insufficient documentation

## 2019-07-16 DIAGNOSIS — F321 Major depressive disorder, single episode, moderate: Secondary | ICD-10-CM | POA: Diagnosis not present

## 2019-07-16 DIAGNOSIS — Z833 Family history of diabetes mellitus: Secondary | ICD-10-CM | POA: Insufficient documentation

## 2019-07-16 DIAGNOSIS — E783 Hyperchylomicronemia: Secondary | ICD-10-CM | POA: Diagnosis not present

## 2019-07-16 DIAGNOSIS — E781 Pure hyperglyceridemia: Secondary | ICD-10-CM

## 2019-07-16 DIAGNOSIS — Z7984 Long term (current) use of oral hypoglycemic drugs: Secondary | ICD-10-CM | POA: Insufficient documentation

## 2019-07-16 DIAGNOSIS — E118 Type 2 diabetes mellitus with unspecified complications: Secondary | ICD-10-CM | POA: Diagnosis not present

## 2019-07-16 DIAGNOSIS — E785 Hyperlipidemia, unspecified: Secondary | ICD-10-CM | POA: Insufficient documentation

## 2019-07-16 DIAGNOSIS — Z9012 Acquired absence of left breast and nipple: Secondary | ICD-10-CM | POA: Diagnosis not present

## 2019-07-16 MED ORDER — ATORVASTATIN CALCIUM 40 MG PO TABS: 40.0000 mg | ORAL_TABLET | Freq: Every day | ORAL | 2 refills | Status: DC

## 2019-07-16 MED ORDER — CITALOPRAM HYDROBROMIDE 20 MG PO TABS: 20.0000 mg | ORAL_TABLET | Freq: Every day | ORAL | 1 refills | Status: DC

## 2019-07-16 MED ORDER — FENOFIBRATE 48 MG PO TABS: 48.0000 mg | ORAL_TABLET | Freq: Every day | ORAL | 0 refills | Status: DC

## 2019-07-16 MED ORDER — GLIMEPIRIDE 4 MG PO TABS: 8.0000 mg | ORAL_TABLET | Freq: Every day | ORAL | 0 refills | Status: DC

## 2019-07-16 MED ORDER — JANUMET 50-1000 MG PO TABS: 1.0000 | ORAL_TABLET | Freq: Two times a day (BID) | ORAL | 1 refills | Status: DC

## 2019-07-16 NOTE — Telephone Encounter (Signed)
JARDIANCE PRIOR AUTH HAS BEEN APPROVED THRU MEDICAID THRU 07/15/20

## 2019-07-16 NOTE — Progress Notes (Signed)
Virtual Visit via Telephone Note Due to national recommendations of social distancing due to [place] 19, telehealth visit is felt to be most appropriate for this patient at this time.  I discussed the limitations, risks, security and privacy concerns of performing an evaluation and management service by telephone and the availability of in person appointments. I also discussed with the patient that there may be a patient responsible charge related to this service. The patient expressed understanding and agreed to proceed.    I connected with Amanda Duarte on 07/16/19  at   9:10 AM EDT  EDT by telephone and verified that I am speaking with the correct person using two identifiers.   Consent I discussed the limitations, risks, security and privacy concerns of performing an evaluation and management service by telephone and the availability of in person appointments. I also discussed with the patient that there may be a patient responsible charge related to this service. The patient expressed understanding and agreed to proceed.   Location of Patient: Private Residence    Location of Provider: [place] and [place] participating in Telemedicine visit: [name] FNP-BC [place] Hernandez-Steinbach    History of Present Illness: Telemedicine visit for: DM TYPE 2   Not well controlled.  Monitoring her blood glucose levels once a day with postprandial readings averaging 150s.  She endorses medication adherence taking Jardiance 10 mg daily, glimepiride 8 mg daily and Janumet 50-1000 mg twice daily.  LDL is at goal and she endorses medication adherence taking atorvastatin 40 mg daily and TriCor 48 mg daily.  She denies any symptoms of hypo or hyperglycemia. Lab Results  Component Value Date   HGBA1C 8.8 (A) 04/15/2019   Lab Results  Component Value Date   LDLCALC 36 01/14/2019    Essential Hypertension She does not monitor her blood pressure at  home.  Endorses medication adherence taking hydrochlorothiazide 25 mg daily and losartan 50 mg daily. Denies chest pain, shortness of breath, palpitations, lightheadedness, dizziness, headaches or BLE edema.  BP Readings from Last 3 Encounters:  06/26/19 (!) 142/64  06/05/19 (!) 142/53  04/15/19 137/66    Past Medical History:  Diagnosis Date  . Cancer ([place]) 03/2017   left breast cancer  . Diabetes mellitus without complication ([place])   . Hyperlipidemia   . Hypertension   . Malignant neoplasm of left breast ([place])   . Tibial plateau fracture, left    04-13-17 had ORIF    Past Surgical History:  Procedure Laterality Date  . MASTECTOMY Left 2019  . MASTECTOMY MODIFIED RADICAL Left 11/20/2017   Procedure: LEFT MODIFIED RADICAL MASTECTOMY;  Surgeon: Fanny Skates, MD;  Location: [place];  Service: General;  Laterality: Left;  . ORIF TIBIA PLATEAU Left 04/13/2017   Procedure: OPEN REDUCTION INTERNAL FIXATION (ORIF) TIBIAL PLATEAU;  Surgeon: Shona Needles, MD;  Location: [place];  Service: Orthopedics;  Laterality: Left;  . PORT-A-CATH REMOVAL Right 11/20/2017   Procedure: REMOVAL PORT-A-CATH;  Surgeon: Fanny Skates, MD;  Location: [place];  Service: General;  Laterality: Right;  . PORTACATH PLACEMENT Right 05/31/2017   Procedure: INSERTION PORT-A-CATH;  Surgeon: Fanny Skates, MD;  Location: [place];  Service: General;  Laterality: Right;    Family History  Problem Relation Age of Onset  . Diabetes Son   . Breast cancer Neg Hx     Social History   Socioeconomic History  . Marital status: Married    Spouse name: Not  on file  . Number of children: Not on file  . Years of education: Not on file  . Highest education level: Not on file  Occupational History  . Not on file  Tobacco Use  . Smoking status: Never Smoker  . Smokeless tobacco: Never Used  Vaping Use  . Vaping Use: Never used  Substance and Sexual Activity  . Alcohol use: No  . Drug use: No  .  Sexual activity: Yes  Other Topics Concern  . Not on file  Social History Narrative  . Not on file   Social Determinants of Health   Financial Resource Strain:   . Difficulty of Paying Living Expenses:   Food Insecurity:   . Worried About Charity fundraiser in the Last Year:   . Arboriculturist in the Last Year:   Transportation Needs:   . Film/video editor (Medical):   [name] Lack of Transportation (Non-Medical):   Physical Activity:   . Days of Exercise per Week:   . Minutes of Exercise per Session:   Stress:   . Feeling of Stress :   Social Connections:   . Frequency of Communication with Friends and Family:   . Frequency of Social Gatherings with Friends and Family:   . Attends Religious Services:   . Active Member of Clubs or Organizations:   . Attends Archivist Meetings:   [name] Marital Status:      Observations/Objective: Awake, alert and oriented x 3   Review of Systems  Constitutional: Negative for fever, malaise/fatigue and weight loss.  HENT: Negative.  Negative for nosebleeds.   Eyes: Negative.  Negative for blurred vision, double vision and photophobia.  Respiratory: Negative.  Negative for cough and shortness of breath.   Cardiovascular: Negative.  Negative for chest pain, palpitations and leg swelling.  Gastrointestinal: Negative.  Negative for heartburn, nausea and vomiting.  Musculoskeletal: Negative.  Negative for myalgias.  Neurological: Negative.  Negative for dizziness, focal weakness, seizures and headaches.  Psychiatric/Behavioral: Positive for depression. Negative for suicidal ideas.    Assessment and Plan: Amanda Duarte was seen today for follow-up.  Diagnoses and all orders for this visit:  Type 2 diabetes mellitus with complication, without long-term current use of insulin (HCC) -     sitaGLIPtin-metformin (JANUMET) 50-1000 MG tablet; Take 1 tablet by mouth 2 (two) times daily with a meal. -     glimepiride (AMARYL) 4 MG tablet; Take 2  tablets (8 mg total) by mouth daily before breakfast. -     Hemoglobin A1c Continue blood sugar control as discussed in office today, low carbohydrate diet, and regular physical exercise as tolerated, 150 minutes per week (30 min each day, 5 days per week, or 50 min 3 days per week). Keep blood sugar logs with fasting goal of 90-130 mg/dl, post prandial (after you eat) less than 180.  For Hypoglycemia: BS <60 and Hyperglycemia BS >400; contact the clinic ASAP. Annual eye exams and foot exams are recommended.   Pure hypertriglyceridemia -     fenofibrate (TRICOR) 48 MG tablet; Take 1 tablet (48 mg total) by mouth daily. -     atorvastatin (LIPITOR) 40 MG tablet; Take 1 tablet (40 mg total) by mouth daily. -     Lipid panel INSTRUCTIONS: Work on a low fat, heart healthy diet and participate in regular aerobic exercise program by working out at least 150 minutes per week; 5 days a week-30 minutes per day. Avoid red meat/beef/steak,  fried  foods. junk foods, sodas, sugary drinks, unhealthy snacking, alcohol and smoking.  Drink at least 80 oz of water per day and monitor your carbohydrate intake daily.    Current moderate episode of major depressive disorder without prior episode (HCC) -     citalopram (CELEXA) 20 MG tablet; Take 1 tablet (20 mg total) by mouth daily. Symptoms are well controlled at this time.  She does not endorse any thoughts of self-harm.  Follow Up Instructions No follow-ups on file.     I discussed the assessment and treatment plan with the patient. The patient was provided an opportunity to ask questions and all were answered. The patient agreed with the plan and demonstrated an understanding of the instructions.   The patient was advised to call back or seek an in-person evaluation if the symptoms worsen or if the condition fails to improve as anticipated.  I provided 14 minutes of non-face-to-face time during this encounter including median intraservice time, reviewing  previous notes, labs, imaging, medications and explaining diagnosis and management.  [name], FNP-BC

## 2019-07-17 ENCOUNTER — Other Ambulatory Visit: Payer: Self-pay | Admitting: Nurse Practitioner

## 2019-07-17 DIAGNOSIS — E781 Pure hyperglyceridemia: Secondary | ICD-10-CM

## 2019-07-17 LAB — LIPID PANEL
Chol/HDL Ratio: 3.9 ratio (ref 0.0–4.4)
Cholesterol, Total: 216 mg/dL — ABNORMAL HIGH (ref 100–199)
HDL: 56 mg/dL (ref >39)
LDL Chol Calc (NIH): 103 mg/dL — ABNORMAL HIGH (ref 0–99)
Triglycerides: 339 mg/dL — ABNORMAL HIGH (ref 0–149)
VLDL Cholesterol Cal: 57 mg/dL — ABNORMAL HIGH (ref 5–40)

## 2019-07-17 LAB — HEMOGLOBIN A1C
Est. average glucose Bld gHb Est-mCnc: 217 mg/dL
Hgb A1c MFr Bld: 9.2 % — ABNORMAL HIGH (ref 4.8–5.6)

## 2019-07-17 MED ORDER — BD PEN NEEDLE MINI U/F 31G X 5 MM MISC
6 refills | Status: DC
Start: 2019-07-17 — End: 2020-03-31

## 2019-07-17 MED ORDER — LANTUS SOLOSTAR 100 UNIT/ML ~~LOC~~ SOPN
10.0000 [IU] | PEN_INJECTOR | Freq: Every day | SUBCUTANEOUS | 99 refills | Status: DC
Start: 2019-07-17 — End: 2020-03-31

## 2019-07-17 MED ORDER — FENOFIBRATE 145 MG PO TABS: 145.0000 mg | ORAL_TABLET | Freq: Every day | ORAL | 2 refills | Status: DC

## 2019-07-23 ENCOUNTER — Other Ambulatory Visit: Payer: Self-pay | Admitting: Physician Assistant

## 2019-07-23 DIAGNOSIS — R1032 Left lower quadrant pain: Secondary | ICD-10-CM

## 2019-08-16 ENCOUNTER — Encounter: Payer: Self-pay | Admitting: Gastroenterology

## 2019-08-16 ENCOUNTER — Other Ambulatory Visit (INDEPENDENT_AMBULATORY_CARE_PROVIDER_SITE_OTHER): Payer: Medicaid Other

## 2019-08-16 ENCOUNTER — Ambulatory Visit (INDEPENDENT_AMBULATORY_CARE_PROVIDER_SITE_OTHER): Payer: Medicaid Other | Admitting: Gastroenterology

## 2019-08-16 VITALS — BP 110/58 | HR 59 | Ht 59.5 in | Wt 126.0 lb

## 2019-08-16 DIAGNOSIS — R1032 Left lower quadrant pain: Secondary | ICD-10-CM | POA: Diagnosis not present

## 2019-08-16 LAB — COMPREHENSIVE METABOLIC PANEL
ALT: 16 U/L (ref 0–35)
AST: 25 U/L (ref 0–37)
Albumin: 4.4 g/dL (ref 3.5–5.2)
Alkaline Phosphatase: 66 U/L (ref 39–117)
BUN: 18 mg/dL (ref 6–23)
CO2: 26 mEq/L (ref 19–32)
Calcium: 9.9 mg/dL (ref 8.4–10.5)
Chloride: 102 mEq/L (ref 96–112)
Creatinine, Ser: 0.7 mg/dL (ref 0.40–1.20)
GFR: 84.19 mL/min (ref >60.00)
Glucose, Bld: 161 mg/dL — ABNORMAL HIGH (ref 70–99)
Potassium: 3.8 mEq/L (ref 3.5–5.1)
Sodium: 135 mEq/L (ref 135–145)
Total Bilirubin: 0.5 mg/dL (ref 0.2–1.2)
Total Protein: 8.4 g/dL — ABNORMAL HIGH (ref 6.0–8.3)

## 2019-08-16 MED ORDER — DICYCLOMINE HCL 20 MG PO TABS: 20.0000 mg | ORAL_TABLET | Freq: Four times a day (QID) | ORAL | 1 refills | Status: DC

## 2019-08-16 MED ORDER — SUPREP BOWEL PREP KIT 17.5-3.13-1.6 GM/177ML PO SOLN
1.0000 | ORAL | 0 refills | Status: DC
Start: 2019-08-16 — End: 2021-10-26

## 2019-08-16 MED ORDER — SUPREP BOWEL PREP KIT 17.5-3.13-1.6 GM/177ML PO SOLN
1.0000 | ORAL | 0 refills | Status: DC
Start: 2019-08-16 — End: 2019-08-16

## 2019-08-16 NOTE — Progress Notes (Signed)
Referring Provider: [name], NP Primary Care Physician:  [name], NP  Reason for Consultation:  Abdominal pain   IMPRESSION:  LLQ pain  Etiology of LLQ pain is unclear. May be GI or GYN in origin.   PLAN: Dicyclomine 20 mg QID PRN pain  Add a daily dose of psyllium or methylcellulose CT abd/pelvis with contrast Colonoscopy Will obtain prior records from Dr. Collene Mares  Please see the "Patient Instructions" section for addition details about the plan.  The nature of the procedure, as well as the risks, benefits, and alternatives were carefully and thoroughly reviewed with the patient. Ample time for discussion and questions allowed. The patient understood, was satisfied, and agreed to proceed.  HPI: Amanda Duarte is a 64 y.o. female referred by NP Raul Del for further evaluation of abdominal pain. The history is obtained through the patient through a Spanish interpreter and review of her electronic health record. She has hypertension, hypertriglyceridemia, diabetes, and a history of breast cancer. She completed the Covid vaccine earlier this year  Previously evaluated by Dr. Collene Mares in 2018. She had a colonoscopy 8-10 years ago.   Seen now for LLQ pain described as intermittent burning that turns into cramps and stabbing pain  Occasional radiation to the right and groin. No change with defecation or eating. Some exacerbation with movement although the pain is relieved when supine.  No change in bowel habits. No diarrhea. Rare constipation. No blood or mucous in the stool.  Pain similar to that which she experienced during a UTI Has not tried any medications, OTC therapies, or dietary changes.   No recent abdominal imaging. Labs 06/05/19 showed a normal CMP except for glucose 206 and total protein 8.4. Albumin was 4.1. Normal CBC.   Abdominal/pelvic CT with contrast 2019: no acute findings except for left axillary adenopathy.   She is concerned it could be  appendicitis.   No known family history of colon cancer or polyps. No family history of uterine/endometrial cancer, pancreatic cancer or gastric/stomach cancer.   Past Medical History:  Diagnosis Date  . Cancer ([place]) 03/2017   left breast cancer  . Diabetes mellitus without complication ([place])   . Hyperlipidemia   . Hypertension   . Malignant neoplasm of left breast ([place])   . Tibial plateau fracture, left    04-13-17 had ORIF    Past Surgical History:  Procedure Laterality Date  . MASTECTOMY Left 2019  . MASTECTOMY MODIFIED RADICAL Left 11/20/2017   Procedure: LEFT MODIFIED RADICAL MASTECTOMY;  Surgeon: Fanny Skates, MD;  Location: [facility];  Service: General;  Laterality: Left;  . ORIF TIBIA PLATEAU Left 04/13/2017   Procedure: OPEN REDUCTION INTERNAL FIXATION (ORIF) TIBIAL PLATEAU;  Surgeon: Shona Needles, MD;  Location: [facility];  Service: Orthopedics;  Laterality: Left;  . PORT-A-CATH REMOVAL Right 11/20/2017   Procedure: REMOVAL PORT-A-CATH;  Surgeon: Fanny Skates, MD;  Location: [facility];  Service: General;  Laterality: Right;  . PORTACATH PLACEMENT Right 05/31/2017   Procedure: INSERTION PORT-A-CATH;  Surgeon: Fanny Skates, MD;  Location: [facility];  Service: General;  Laterality: Right;    Current Outpatient Medications  Medication Sig Dispense Refill  . ACCU-CHEK FASTCLIX LANCETS MISC Use as instructed. 100 each 12  . atorvastatin (LIPITOR) 40 MG tablet Take 1 tablet (40 mg total) by mouth daily. 90 tablet 2  . Blood Glucose Monitoring Suppl (ACCU-CHEK GUIDE ME) w/Device KIT 1 kit by Does not apply route daily. 1 kit 0  . Blood  Pressure Monitor DEVI Please provide patient with insurance approved blood pressure monitor 1 each 0  . citalopram (CELEXA) 20 MG tablet Take 1 tablet (20 mg total) by mouth daily. 90 tablet 1  . empagliflozin (JARDIANCE) 10 MG TABS tablet Take 10 mg by mouth daily before breakfast. 30 tablet 3  . fenofibrate (TRICOR) 145 MG tablet  Take 1 tablet (145 mg total) by mouth daily. 90 tablet 2  . glimepiride (AMARYL) 4 MG tablet Take 2 tablets (8 mg total) by mouth daily before breakfast. 180 tablet 0  . glucose blood (ACCU-CHEK GUIDE) test strip Use as instructed 100 each 11  . hydrochlorothiazide (HYDRODIURIL) 25 MG tablet Take 1 tablet (25 mg total) by mouth daily. 90 tablet 3  . insulin glargine (LANTUS SOLOSTAR) 100 UNIT/ML Solostar Pen Inject 10 Units into the skin daily. 5 pen PRN  . Insulin Pen Needle (B-D UF III MINI PEN NEEDLES) 31G X 5 MM MISC Use as instructed. Inject into the skin once nightly. 100 each 6  . losartan (COZAAR) 50 MG tablet Take 1 tablet (50 mg total) by mouth daily. 90 tablet 3  . sitaGLIPtin-metformin (JANUMET) 50-1000 MG tablet Take 1 tablet by mouth 2 (two) times daily with a meal. 180 tablet 1   No current facility-administered medications for this visit.    Allergies as of 08/16/2019 - Review Complete 07/16/2019  Allergen Reaction Noted  . Victoza [liraglutide] Nausea And Vomiting 06/27/2018    Family History  Problem Relation Age of Onset  . Diabetes Son   . Breast cancer Neg Hx     Social History   Socioeconomic History  . Marital status: Married    Spouse name: Not on file  . Number of children: Not on file  . Years of education: Not on file  . Highest education level: Not on file  Occupational History  . Not on file  Tobacco Use  . Smoking status: Never Smoker  . Smokeless tobacco: Never Used  Vaping Use  . Vaping Use: Never used  Substance and Sexual Activity  . Alcohol use: No  . Drug use: No  . Sexual activity: Yes  Other Topics Concern  . Not on file  Social History Narrative  . Not on file   Social Determinants of Health   Financial Resource Strain:   . Difficulty of Paying Living Expenses:   Food Insecurity:   . Worried About Charity fundraiser in the Last Year:   . Arboriculturist in the Last Year:   Transportation Needs:   . Film/video editor  (Medical):   [name] Lack of Transportation (Non-Medical):   Physical Activity:   . Days of Exercise per Week:   . Minutes of Exercise per Session:   Stress:   . Feeling of Stress :   Social Connections:   . Frequency of Communication with Friends and Family:   . Frequency of Social Gatherings with Friends and Family:   . Attends Religious Services:   . Active Member of Clubs or Organizations:   . Attends Archivist Meetings:   [name] Marital Status:   Intimate Partner Violence:   . Fear of Current or Ex-Partner:   . Emotionally Abused:   [name] Physically Abused:   . Sexually Abused:     Review of Systems: 12 system ROS is negative except as noted above.   Physical Exam: General:   Alert,  well-nourished, pleasant and cooperative in NAD Head:  Normocephalic and atraumatic.  Eyes:  Sclera clear, no icterus.   Conjunctiva pink. Ears:  Normal auditory acuity. Nose:  No deformity, discharge,  or lesions. Mouth:  No deformity or lesions.   Neck:  Supple; no masses or thyromegaly. Lungs:  Clear throughout to auscultation.   No wheezes. Heart:  Regular rate and rhythm; no murmurs. Abdomen:  Soft, nontender, nondistended, normal bowel sounds, no rebound or guarding. No hepatosplenomegaly.   Rectal:  Deferred  Msk:  Symmetrical. No boney deformities LAD: No inguinal or umbilical LAD Extremities:  No clubbing or edema. Neurologic:  Alert and  oriented x4;  grossly nonfocal Skin:  Intact without significant lesions or rashes. Psych:  Alert and cooperative. Normal mood and affect.   Rondal Vandevelde L. Tarri Glenn, MD, MPH 08/16/2019, 10:35 AM

## 2019-08-16 NOTE — Patient Instructions (Addendum)
I have recommended a CT scan and a colonoscopy to further evaluate your left sided abdominal pain.   In the meantime, please start taking at least one dose of Metamucil, Benefiber, or Citrucel.  I would like to see if a trial of dicyclomine used up to four times daily may provide some relief of your pain in the meantime.    Your provider has requested that you go to the basement level for lab work before leaving today. Press "B" on the elevator. The lab is located at the first door on the left as you exit the elevator.   You have been scheduled for a CT scan of the abdomen and pelvis at [facility], 1st floor Radiology. You are scheduled on 08/23/19 at 2:30pm. You should arrive 15 minutes prior to your appointment time for registration. Please follow the written instructions below on the day of your exam:    1) Do not eat anything after 10:30am (4 hours prior to your test)   Drink 1 bottle of contrast @ 12:30pm (2 hours prior to your exam)  Drink 1 bottle of contrast @ 1:30pm (1 hour prior to your exam)   You may take any medications as prescribed with a small amount of water, if necessary. If you take any of the following medications: METFORMIN, GLUCOPHAGE, GLUCOVANCE, AVANDAMET, RIOMET, FORTAMET, Rough and Ready MET, JANUMET, GLUMETZA or METAGLIP, you MAY be asked to HOLD this medication 48 hours AFTER the exam.   The purpose of you drinking the oral contrast is to aid in the visualization of your intestinal tract. The contrast solution may cause some diarrhea. Depending on your individual set of symptoms, you may also receive an intravenous injection of x-ray contrast/dye. Plan on being at [facility] for 45 minutes or longer, depending on the type of exam you are having performed.   If you have any questions regarding your exam or if you need to reschedule, you may call [facility] at [phone] between the hours of 8:00 am and 5:00 pm, Monday-Friday.       ________________________________________________________________________  We have sent the following medications to your pharmacy for you to pick up at your convenience: Dicyclomine and Babb   If you are age 15 or older, your body mass index should be between 23-30. Your Body mass index is 25.02 kg/m. If this is out of the aforementioned range listed, please consider follow up with your Primary Care Provider.  If you are age 92 or younger, your body mass index should be between 19-25. Your Body mass index is 25.02 kg/m. If this is out of the aformentioned range listed, please consider follow up with your Primary Care Provider.    You have been scheduled for a colonoscopy. Please follow written instructions given to you at your visit today.  Please pick up your prep supplies at the pharmacy within the next 1-3 days. If you use inhalers (even only as needed), please bring them with you on the day of your procedure.   Will we try to obtain records from Dr. Collene Mares office.   Thank you for choosing me and [facility].  Dr.Precilla Purnell

## 2019-08-21 ENCOUNTER — Encounter: Payer: Self-pay | Admitting: Gastroenterology

## 2019-08-21 ENCOUNTER — Other Ambulatory Visit: Payer: Self-pay

## 2019-08-21 ENCOUNTER — Ambulatory Visit (AMBULATORY_SURGERY_CENTER): Payer: Medicaid Other | Admitting: Gastroenterology

## 2019-08-21 VITALS — BP 150/65 | HR 45 | Temp 96.6°F | Resp 14 | Ht 59.5 in | Wt 126.0 lb

## 2019-08-21 DIAGNOSIS — R1032 Left lower quadrant pain: Secondary | ICD-10-CM

## 2019-08-21 DIAGNOSIS — K6389 Other specified diseases of intestine: Secondary | ICD-10-CM

## 2019-08-21 DIAGNOSIS — D123 Benign neoplasm of transverse colon: Secondary | ICD-10-CM

## 2019-08-21 DIAGNOSIS — E119 Type 2 diabetes mellitus without complications: Secondary | ICD-10-CM | POA: Diagnosis not present

## 2019-08-21 DIAGNOSIS — I10 Essential (primary) hypertension: Secondary | ICD-10-CM | POA: Diagnosis not present

## 2019-08-21 MED ORDER — SODIUM CHLORIDE 0.9 % IV SOLN: 500.0000 mL | Freq: Once | INTRAVENOUS | Status: DC

## 2019-08-21 NOTE — Patient Instructions (Signed)
USTED TUVO UN PROCEDIMIENTO ENDOSCPICO HOY EN [facility]:   Lea el informe del procedimiento que se le entreg para cualquier pregunta especfica sobre lo que se encontr durante su examen.  Si el informe del examen no responde a sus preguntas, por favor llame a su gastroenterlogo para aclararlo.  Si usted solicit que no se le den detalles de lo que se encontr en su procedimiento al acompaante que le va a cuidar, entonces el informe del procedimiento se ha incluido en un sobre sellado para que usted lo revise despus cuando le sea ms conveniente.   LO QUE PUEDE ESPERAR: Algunas sensaciones de hinchazn en el abdomen.  Puede tener ms gases de lo normal.  El caminar puede ayudarle a eliminar el aire que se le puso en el tracto gastrointestinal durante el procedimiento y reducir la hinchazn.  Si le hicieron una endoscopia inferior (como una colonoscopia o una sigmoidoscopia flexible), podra notar manchas de sangre en las heces fecales o en el papel higinico.  Si se someti a una preparacin intestinal para su procedimiento, es posible que no tenga una evacuacin intestinal normal durante algunos das.   Tenga en cuenta:  Es posible que note un poco de irritacin y congestin en la nariz o algn drenaje.  Esto es debido al oxgeno utilizado durante su procedimiento.  No hay que preocuparse y esto debe desaparecer ms o menos en un da.   SNTOMAS PARA REPORTAR INMEDIATAMENTE:  Despus de una endoscopia inferior (colonoscopia o sigmoidoscopia flexible):  Cantidades excesivas de sangre en las heces fecales  Sensibilidad significativa o empeoramiento de los dolores abdominales   Hinchazn aguda del abdomen que antes no tena   Fiebre de 100F o ms    Para asuntos urgentes o de emergencia, puede comunicarse con un gastroenterlogo a cualquier hora llamando al [phone].  DIETA:  Recomendamos una comida pequea al principio, pero luego puede continuar con su dieta normal.   Tome muchos lquidos, pero debe evitar las bebidas alcohlicas durante 24 horas.    ACTIVIDAD:  Debe planear tomarse las cosas con calma por el resto del da y no debe CONDUCIR ni usar maquinaria pesada hasta maana (debido a los medicamentos de sedacin utilizados durante el examen).     SEGUIMIENTO: Nuestro personal llamar al nmero que aparece en su historial al siguiente da hbil de su procedimiento para ver cmo se siente y para responder cualquier pregunta o inquietud que pueda tener con respecto a la informacin que se le dio despus del procedimiento. Si no podemos contactarle, le dejaremos un mensaje.  Sin embargo, si se siente bien y no tiene ningn problema, no es necesario que nos devuelva la llamada.  Asumiremos que ha regresado a sus actividades diarias normales sin incidentes. Si se le tomaron algunas biopsias, le contactaremos por telfono o por carta en las prximas 3 semanas.  Si no ha sabido nada sobre las biopsias en el transcurso de 3 semanas, por favor llmenos al [phone].   FIRMAS/CONFIDENCIALIDAD: Usted y/o el acompaante que le cuide han firmado documentos que se ingresarn en su historial mdico electrnico.  Estas firmas atestiguan el hecho de que la informacin anterior  

## 2019-08-21 NOTE — Progress Notes (Signed)
Patient had pre visit states no change.  When reviewed medication list patient states "I took them all on Monday.  I did not take any yesterday."  She was unable to recognize names to verify list.  States "I take 6 or 7 but I am not sure".  I contacted daughter [name] who states "she takes her own medications from the bottles.  I know she takes blood pressure and blood sugar medications.  I don't know which ones".

## 2019-08-21 NOTE — Progress Notes (Signed)
Called to room to assist during endoscopic procedure.  Patient ID and intended procedure confirmed with present staff. Received instructions for my participation in the procedure from the performing physician.  

## 2019-08-21 NOTE — Progress Notes (Signed)
Report to PACU, RN, vss, BBS= Clear.  

## 2019-08-21 NOTE — Op Note (Signed)
[facility] Patient Name: Amanda Duarte Procedure Date: 08/21/2019 8:46 AM MRN: [id number] Endoscopist: Thornton Park MD, MD Age: 64 Referring MD:  Date of Birth: 07-23-1955 Gender: Female Account #: [id number] Procedure:                Colonoscopy Indications:              Abdominal pain in the left lower quadrant                           Prior colonoscopy with Dr. Collene Mares 8-10 years ago Medicines:                Monitored Anesthesia Care Procedure:                Pre-Anesthesia Assessment:                           - Prior to the procedure, a History and Physical                            was performed, and patient medications and                            allergies were reviewed. The patient's tolerance of                            previous anesthesia was also reviewed. The risks                            and benefits of the procedure and the sedation                            options and risks were discussed with the patient.                            All questions were answered, and informed consent                            was obtained. Prior Anticoagulants: The patient has                            taken no previous anticoagulant or antiplatelet                            agents. ASA Grade Assessment: II - A patient with                            mild systemic disease. After reviewing the risks                            and benefits, the patient was deemed in                            satisfactory condition to undergo the procedure.  After obtaining informed consent, the colonoscope                            was passed under direct vision. Throughout the                            procedure, the patient's blood pressure, pulse, and                            oxygen saturations were monitored continuously. The                            Colonoscope was introduced through the anus and                            advanced to the  6 cm into the ileum. A second                            forward view of the right colon was performed. The                            colonoscopy was performed without difficulty. The                            patient tolerated the procedure well. The quality                            of the bowel preparation was good. The terminal                            ileum, ileocecal valve, appendiceal orifice, and                            rectum were photographed. Scope In: 8:54:09 AM Scope Out: 9:08:04 AM Scope Withdrawal Time: 0 hours 9 minutes 18 seconds  Total Procedure Duration: 0 hours 13 minutes 55 seconds  Findings:                 The perianal and digital rectal examinations were                            normal.                           A 1 mm polyp was found in the hepatic flexure. The                            polyp was sessile. The polyp was removed with a                            cold snare. Resection and retrieval were complete.                            Estimated blood loss was minimal.  The entire colonic mucosa has some sublte                            nodularity with an appearance similar to prominent                            lymphoid aggregates. In some areas, these                            aggregates have a white/yellow hue. Multiple                            biopsies were taken with a cold forceps for                            histology. Estimated blood loss was minimal.                           The exam was otherwise without abnormality on                            direct and retroflexion views. Complications:            No immediate complications. Estimated blood loss:                            Minimal. Estimated Blood Loss:     Estimated blood loss was minimal. Impression:               - One 1 mm polyp at the hepatic flexure, removed                            with a cold snare. Resected and retrieved.                            - Abnormal mucosa in the entire examined colon. Of                            unclear clinical significance. Biopsied.                           - The examination was otherwise normal on direct                            and retroflexion views. Recommendation:           - Patient has a contact number available for                            emergencies. The signs and symptoms of potential                            delayed complications were discussed with the  patient. Return to normal activities tomorrow.                            Written discharge instructions were provided to the                            patient.                           - Resume previous diet.                           - Continue present medications including                            dicyloclomine 20 mg QID and daily psyllium.                           - Await pathology results.                           - Repeat colonoscopy date to be determined after                            pending pathology results are reviewed for                            surveillance.                           - Emerging evidence supports eating a diet of                            fruits, vegetables, grains, calcium, and yogurt                            while reducing red meat and alcohol may reduce the                            risk of colon cancer.                           - Proceed with CT abd/pelvis with contrast to                            further evaluate the abdominal pain (already                            scheduled).                           - Recommend follow-up with GYN. Thornton Park MD, MD 08/21/2019 9:16:10 AM This report has been signed electronically.

## 2019-08-23 ENCOUNTER — Ambulatory Visit (HOSPITAL_COMMUNITY)
Admission: RE | Admit: 2019-08-23 | Discharge: 2019-08-23 | Disposition: A | Discharge status: Home / Self Care | Payer: Medicaid Other | Source: Ambulatory Visit | Attending: Gastroenterology | Admitting: Gastroenterology

## 2019-08-23 ENCOUNTER — Encounter (HOSPITAL_COMMUNITY): Payer: Self-pay

## 2019-08-23 ENCOUNTER — Other Ambulatory Visit: Payer: Self-pay

## 2019-08-23 ENCOUNTER — Telehealth: Payer: Self-pay | Admitting: *Deleted

## 2019-08-23 ENCOUNTER — Telehealth: Payer: Self-pay

## 2019-08-23 DIAGNOSIS — R1032 Left lower quadrant pain: Secondary | ICD-10-CM | POA: Diagnosis present

## 2019-08-23 MED ORDER — SODIUM CHLORIDE (PF) 0.9 % IJ SOLN
INTRAMUSCULAR | Status: AC
  Filled 2019-08-23: qty 50

## 2019-08-23 MED ORDER — IOHEXOL 300 MG/ML  SOLN
100.0000 mL | Freq: Once | INTRAMUSCULAR | Status: AC | PRN
  Administered 2019-08-23: 100 mL via INTRAVENOUS

## 2019-08-23 NOTE — Telephone Encounter (Signed)
LVM

## 2019-08-23 NOTE — Telephone Encounter (Signed)
  Follow up Call-  Call back number 08/21/2019  Post procedure Call Back phone  # [phone] daughter (interperts)  Permission to leave phone message Yes  Some recent data might be hidden    Spoke with daughter Patient questions:  Do you have a fever, pain , or abdominal swelling? No. Pain Score  0 *  Have you tolerated food without any problems? Yes.    Have you been able to return to your normal activities? Yes.    Do you have any questions about your discharge instructions: Diet   No. Medications  No. Follow up visit  No.  Do you have questions or concerns about your Care? No.  Actions: * If pain score is 4 or above: No action needed, pain <4.  1. Have you developed a fever since your procedure? no  2.   Have you had an respiratory symptoms (SOB or cough) since your procedure? no  3.   Have you tested positive for COVID 19 since your procedure no  4.   Have you had any family members/close contacts diagnosed with the COVID 19 since your procedure?  no   If yes to any of these questions please route to Joylene John, RN and Erenest Rasher, RN

## 2019-09-03 ENCOUNTER — Other Ambulatory Visit: Payer: Self-pay

## 2019-09-03 DIAGNOSIS — R1032 Left lower quadrant pain: Secondary | ICD-10-CM

## 2019-09-11 ENCOUNTER — Ambulatory Visit: Payer: Medicaid Other | Admitting: Gastroenterology

## 2019-10-08 ENCOUNTER — Inpatient Hospital Stay: Payer: Medicare Other

## 2019-10-08 ENCOUNTER — Encounter: Payer: Self-pay | Admitting: Nurse Practitioner

## 2019-10-08 ENCOUNTER — Inpatient Hospital Stay: Payer: Medicare Other | Attending: Nurse Practitioner | Admitting: Nurse Practitioner

## 2019-10-08 ENCOUNTER — Telehealth: Payer: Self-pay | Admitting: Nurse Practitioner

## 2019-10-08 ENCOUNTER — Other Ambulatory Visit: Payer: Self-pay

## 2019-10-08 VITALS — BP 130/60 | HR 52 | Temp 97.8°F | Resp 18 | Ht 59.5 in | Wt 124.8 lb

## 2019-10-08 DIAGNOSIS — Z170 Estrogen receptor positive status [ER+]: Secondary | ICD-10-CM

## 2019-10-08 DIAGNOSIS — M546 Pain in thoracic spine: Secondary | ICD-10-CM | POA: Diagnosis not present

## 2019-10-08 DIAGNOSIS — R102 Pelvic and perineal pain: Secondary | ICD-10-CM | POA: Diagnosis not present

## 2019-10-08 DIAGNOSIS — C50212 Malignant neoplasm of upper-inner quadrant of left female breast: Secondary | ICD-10-CM

## 2019-10-08 DIAGNOSIS — R1032 Left lower quadrant pain: Secondary | ICD-10-CM | POA: Diagnosis not present

## 2019-10-08 DIAGNOSIS — G8929 Other chronic pain: Secondary | ICD-10-CM | POA: Diagnosis not present

## 2019-10-08 DIAGNOSIS — I10 Essential (primary) hypertension: Secondary | ICD-10-CM | POA: Diagnosis not present

## 2019-10-08 DIAGNOSIS — Z171 Estrogen receptor negative status [ER-]: Secondary | ICD-10-CM | POA: Insufficient documentation

## 2019-10-08 DIAGNOSIS — E119 Type 2 diabetes mellitus without complications: Secondary | ICD-10-CM | POA: Insufficient documentation

## 2019-10-08 DIAGNOSIS — M8580 Other specified disorders of bone density and structure, unspecified site: Secondary | ICD-10-CM | POA: Diagnosis not present

## 2019-10-08 DIAGNOSIS — Z9012 Acquired absence of left breast and nipple: Secondary | ICD-10-CM | POA: Insufficient documentation

## 2019-10-08 DIAGNOSIS — G629 Polyneuropathy, unspecified: Secondary | ICD-10-CM | POA: Insufficient documentation

## 2019-10-08 LAB — CBC WITH DIFFERENTIAL (CANCER CENTER ONLY)
Abs Immature Granulocytes: 0.01 10*3/uL (ref 0.00–0.07)
Basophils Absolute: 0.1 10*3/uL (ref 0.0–0.1)
Basophils Relative: 1 %
Eosinophils Absolute: 1.8 10*3/uL — ABNORMAL HIGH (ref 0.0–0.5)
Eosinophils Relative: 22 %
HCT: 41.6 % (ref 36.0–46.0)
Hemoglobin: 14 g/dL (ref 12.0–15.0)
Immature Granulocytes: 0 %
Lymphocytes Relative: 30 %
Lymphs Abs: 2.5 10*3/uL (ref 0.7–4.0)
MCH: 32.2 pg (ref 26.0–34.0)
MCHC: 33.7 g/dL (ref 30.0–36.0)
MCV: 95.6 fL (ref 80.0–100.0)
Monocytes Absolute: 0.4 10*3/uL (ref 0.1–1.0)
Monocytes Relative: 5 %
Neutro Abs: 3.5 10*3/uL (ref 1.7–7.7)
Neutrophils Relative %: 42 %
Platelet Count: 220 10*3/uL (ref 150–400)
RBC: 4.35 MIL/uL (ref 3.87–5.11)
RDW: 13.8 % (ref 11.5–15.5)
WBC Count: 8.2 10*3/uL (ref 4.0–10.5)
nRBC: 0 % (ref 0.0–0.2)

## 2019-10-08 LAB — CMP (CANCER CENTER ONLY)
ALT: 18 U/L (ref 0–44)
AST: 29 U/L (ref 15–41)
Albumin: 4.1 g/dL (ref 3.5–5.0)
Alkaline Phosphatase: 74 U/L (ref 38–126)
Anion gap: 6 (ref 5–15)
BUN: 12 mg/dL (ref 8–23)
CO2: 29 mmol/L (ref 22–32)
Calcium: 9.6 mg/dL (ref 8.9–10.3)
Chloride: 103 mmol/L (ref 98–111)
Creatinine: 0.81 mg/dL (ref 0.44–1.00)
GFR, Est AFR Am: >60 mL/min (ref >60)
GFR, Estimated: >60 mL/min (ref >60)
Glucose, Bld: 175 mg/dL — ABNORMAL HIGH (ref 70–99)
Potassium: 4.5 mmol/L (ref 3.5–5.1)
Sodium: 138 mmol/L (ref 135–145)
Total Bilirubin: 0.8 mg/dL (ref 0.3–1.2)
Total Protein: 8.5 g/dL — ABNORMAL HIGH (ref 6.5–8.1)
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
## 2019-10-08 NOTE — Telephone Encounter (Signed)
Scheduled appointment per 9/14 los. Patient is aware of appointment. I gave patient updated calendar print out.

## 2019-10-09 ENCOUNTER — Telehealth: Payer: Self-pay | Admitting: Hematology

## 2019-10-09 ENCOUNTER — Telehealth: Payer: Self-pay

## 2019-10-09 NOTE — Telephone Encounter (Signed)
Ps it already scheduled in 4 months per 9/14 los. Sent message to [facility] about referral. Noted that they are unable to schedule appt due to insurance.

## 2019-10-09 NOTE — Telephone Encounter (Signed)
Referral # [id number] placed and appt made for 11/04/19 at 11am for [facility] [place] option 4

## 2019-11-04 ENCOUNTER — Ambulatory Visit: Payer: Medicare Other | Admitting: Obstetrics and Gynecology

## 2019-11-18 ENCOUNTER — Other Ambulatory Visit: Payer: Self-pay

## 2019-11-20 ENCOUNTER — Encounter: Payer: Self-pay | Admitting: Obstetrics and Gynecology

## 2019-11-20 ENCOUNTER — Other Ambulatory Visit (HOSPITAL_COMMUNITY)
Admission: RE | Admit: 2019-11-20 | Discharge: 2019-11-20 | Disposition: A | Discharge status: Home / Self Care | Payer: Medicare Other | Source: Ambulatory Visit | Attending: Obstetrics and Gynecology | Admitting: Obstetrics and Gynecology

## 2019-11-20 ENCOUNTER — Other Ambulatory Visit: Payer: Self-pay

## 2019-11-20 ENCOUNTER — Ambulatory Visit (INDEPENDENT_AMBULATORY_CARE_PROVIDER_SITE_OTHER): Payer: Medicare Other | Admitting: Obstetrics and Gynecology

## 2019-11-20 DIAGNOSIS — N762 Acute vulvitis: Secondary | ICD-10-CM | POA: Diagnosis present

## 2019-11-20 DIAGNOSIS — N952 Postmenopausal atrophic vaginitis: Secondary | ICD-10-CM | POA: Insufficient documentation

## 2019-11-20 DIAGNOSIS — R1032 Left lower quadrant pain: Secondary | ICD-10-CM | POA: Diagnosis not present

## 2019-11-20 DIAGNOSIS — R109 Unspecified abdominal pain: Secondary | ICD-10-CM | POA: Insufficient documentation

## 2019-11-20 NOTE — Progress Notes (Signed)
.  VULVAR BIOPSY NOTE The indications for vulvar biopsy (rule out neoplasia, establish lichen sclerosus diagnosis) were reviewed.   Risks of the biopsy including pain, bleeding, infection, inadequate specimen, scarring and need for additional procedures  were discussed. The patient stated understanding and agreed to undergo procedure today. Consent was signed,  time out performed.  The patient's vulva was prepped with Betadine. 1% lidocaine was injected into the right vulva. A 4-mm punch biopsy was done, biopsy tissue was picked up with sterile forceps and sterile scissors were used to excise the lesion.  Small bleeding was noted and hemostasis was achieved using silver nitrate sticks.  The patient tolerated the procedure well. Post-procedure instructions  (pelvic rest for one week) were given to the patient. The patient is to call with heavy bleeding, fever greater than 100.4, foul smelling vaginal discharge or other concerns. The patient will be return to clinic in four weeks for discussion of results.

## 2019-11-20 NOTE — Progress Notes (Signed)
NGYN pt c/o intermittent LLQ "stabbing" and  pain occasional radiation to the right and groin area x 2 yrs  Colonoscopy 8-10 yrs ago  Seen by Gastroenterologist 08/16/19  Normal pap smear 1 yr ago per pt  Left Mastectomy - Last Mammogram 07/31/2018

## 2019-11-20 NOTE — Progress Notes (Signed)
   Subjective:    Patient ID: Amanda Duarte, female    DOB: [birth year], 64 y.o.   MRN: [id number] CC: abdominal pain HPI  64 yo G5P5 , SVD x 5 seen at [facility] clinic.  Pt is survivor of left sided breast cancer and is s/p mastectomy.  Per pt she received radiation and chemotherapy.  She has been complaining of LLQ pain the last few months.  She says the pain is intermittent and stabbing.  She also notes a burning sensation around the vulva and vagina.  Of note pt went into menopause at age 43.    Review of Systems  Constitutional: Negative.   HENT: Negative.   Eyes: Negative.   Respiratory: Negative.   Cardiovascular: Negative.   Gastrointestinal: Positive for abdominal pain. Negative for constipation, diarrhea, nausea and vomiting.  Endocrine: Negative.   Genitourinary:       Vaginal itching/discomfort  Musculoskeletal: Negative.   Neurological: Negative.        Objective:   Physical Exam Constitutional:      General: She is not in acute distress.    Appearance: She is well-developed and normal weight.  HENT:     Head: Normocephalic and atraumatic.  Cardiovascular:     Rate and Rhythm: Normal rate and regular rhythm.     Heart sounds: Normal heart sounds.  Pulmonary:     Effort: Pulmonary effort is normal.     Breath sounds: Normal breath sounds.  Abdominal:     General: Abdomen is flat.     Palpations: There is no mass.     Tenderness: There is generalized abdominal tenderness.  Genitourinary:    Vagina: No vaginal discharge.     Cervix: Normal.     Uterus: Normal. Not enlarged and not fixed.      Adnexa:        Right: No mass or tenderness.         Left: No mass or tenderness.       Comments: Vagina appears atrophic Vulva: possible mild lichen sclerosis Neurological:     Mental Status: She is alert.           Assessment & Plan:   1. Left lower quadrant abdominal pain Previous CT scan of abdomen was negative, but will check u/s to fully eval pelvic  structures.  At this time do not believe patient's discomfort is of gyn etiology - US PELVIC COMPLETE WITH TRANSVAGINAL; Future  2. Vaginal atrophy Noted,do not believe pt is a candidate for topical estrogen due to previous breast cancer, will check with oncologist before any Rx - Surgical pathology( [place])  3. Acute vulvitis Biopsy taken see separate note - Surgical pathology( [place])  F/u in 1 month to review results  Lynnda Shields, MD

## 2019-11-20 NOTE — Patient Instructions (Signed)
Dolor abdominal en los adultos Abdominal Pain, Adult El dolor en el abdomen (dolor abdominal) puede tener muchas causas. A menudo, el dolor abdominal no es grave y [place] sin tratamiento o con tratamiento en la casa. Sin embargo, a Product/process development scientist abdominal es grave. El mdico le har preguntas sobre sus antecedentes mdicos y le har un examen fsico para tratar de Office manager causa del dolor abdominal. Siga estas instrucciones en su casa:  Medicamentos  Use los medicamentos de venta libre y los recetados solamente como se lo haya indicado el mdico.  No tome un laxante a menos que se lo haya indicado el mdico. Instrucciones generales  Controle su afeccin para detectar cualquier cambio.  Beba suficiente lquido como para Theatre manager la orina de color amarillo plido.  Concurra a todas las visitas de seguimiento como se lo haya indicado el mdico. Esto es importante. Comunquese con un mdico si:  El dolor abdominal cambia o empeora.  No tiene apetito o baja de peso sin proponrselo.  Est estreido o tiene diarrea durante ms de 2 o 3das.  Tiene dolor cuando orina o defeca.  El dolor abdominal lo despierta de noche.  El dolor empeora con las comidas, despus de comer o con determinados alimentos.  Tiene vmitos y no puede retener nada de lo que ingiere.  Tiene fiebre.  Observa sangre en la orina. Solicite ayuda inmediatamente si:  El dolor no desaparece tan pronto como el mdico le dijo que era esperable.  No puede dejar de vomitar.  El Social research officer, government se siente solo en zonas del abdomen, como el lado derecho o la parte inferior izquierda del abdomen. Si se localiza en la zona derecha, posiblemente podra tratarse de apendicitis.  Las heces son sanguinolentas o de color negro, o de aspecto alquitranado.  Tiene dolor intenso, clicos o distensin abdominal.  Tiene signos de deshidratacin, como los siguientes: ? Orina de color oscuro, muy escasa o falta de orina. ? Labios  agrietados. ? Sequedad de boca. ? Ojos hundidos. ? Somnolencia. ? Debilidad.  Tiene dificultad para respirar o Tourist information centre manager. Resumen  A menudo, el dolor abdominal no es grave y [place] sin tratamiento o con tratamiento en la casa. Sin embargo, a Product/process development scientist abdominal es grave.  Controle su afeccin para Actuary cambio.  Use los medicamentos de venta libre y los recetados solamente como se lo haya indicado el mdico.  Comunquese con un mdico si el dolor abdominal cambia o [place].  Busque ayuda de inmediato si tiene dolor intenso, clicos o distensin abdominal. Esta informacin no tiene Marine scientist el consejo del mdico. Asegrese de hacerle al mdico cualquier pregunta que tenga. Document Revised: 07/18/2018 Document Reviewed: 07/18/2018 Elsevier Patient Education  [place].

## 2019-11-25 ENCOUNTER — Other Ambulatory Visit: Payer: Self-pay

## 2019-11-26 LAB — SURGICAL PATHOLOGY

## 2019-11-29 ENCOUNTER — Other Ambulatory Visit: Payer: Self-pay

## 2019-11-29 DIAGNOSIS — N952 Postmenopausal atrophic vaginitis: Secondary | ICD-10-CM

## 2019-11-29 DIAGNOSIS — N762 Acute vulvitis: Secondary | ICD-10-CM

## 2019-11-29 MED ORDER — HYDROCORTISONE 2.5 % EX CREA: TOPICAL_CREAM | Freq: Two times a day (BID) | CUTANEOUS | 0 refills | Status: DC

## 2019-11-29 NOTE — Progress Notes (Signed)
Pt made aware of results and Rx sent  Pt voiced understanding  Called pt with interpreter

## 2019-12-04 ENCOUNTER — Ambulatory Visit (HOSPITAL_COMMUNITY)
Admission: RE | Admit: 2019-12-04 | Discharge: 2019-12-04 | Disposition: A | Discharge status: Home / Self Care | Payer: Medicare Other | Source: Ambulatory Visit | Attending: Obstetrics and Gynecology | Admitting: Obstetrics and Gynecology

## 2019-12-04 ENCOUNTER — Other Ambulatory Visit: Payer: Self-pay

## 2019-12-04 DIAGNOSIS — R1032 Left lower quadrant pain: Secondary | ICD-10-CM | POA: Diagnosis present

## 2020-02-05 ENCOUNTER — Telehealth: Payer: Self-pay | Admitting: Nurse Practitioner

## 2020-02-05 NOTE — Telephone Encounter (Signed)
Scheduled follow-up appointment per 1/12 schedule message. Patient's daughter is aware.

## 2020-02-06 ENCOUNTER — Ambulatory Visit: Payer: Medicare Other | Admitting: Hematology

## 2020-02-06 ENCOUNTER — Other Ambulatory Visit: Payer: Medicare Other

## 2020-02-07 ENCOUNTER — Other Ambulatory Visit: Payer: Self-pay | Admitting: Surgery

## 2020-02-07 DIAGNOSIS — Z1231 Encounter for screening mammogram for malignant neoplasm of breast: Secondary | ICD-10-CM

## 2020-02-10 ENCOUNTER — Inpatient Hospital Stay: Payer: Medicare Other

## 2020-02-10 ENCOUNTER — Inpatient Hospital Stay: Payer: Medicare Other | Attending: Nurse Practitioner | Admitting: Nurse Practitioner

## 2020-02-10 NOTE — Progress Notes (Deleted)
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  SUMMARY OF ONCOLOGIC HISTORY: Oncology History Overview Note  Cancer Staging Malignant neoplasm of upper-inner quadrant of left breast in female, estrogen receptor positive ([place]) Staging form: Breast, AJCC 8th Edition - Clinical stage from 05/01/2017: Stage Unknown (cTX, cN2, cM0, G3, ER-, PR-, HER2-) - Signed by [name], MD on 11/01/2017 - Pathologic stage from 11/20/2017: No Stage Recommended (ypT0, pN62m, cM0, GX, ER-, PR-, HER2-) - Signed by [name] on 12/10/2017     Malignant neoplasm of upper-inner quadrant of left breast in female, estrogen receptor positive ([place]  04/28/2017 Mammogram   IMPRESSION: Two adjacent masses/enlarged lymph nodes in the LOWER LEFT axilla, the largest measuring 2.1 cm. Tissue sampling of 1 of these is recommended to exclude malignancy/lymphoma. No mammographic evidence of breast malignancy bilaterally.    05/01/2017 Initial Biopsy   Diagnosis 05/01/17 Lymph node, needle/core biopsy, low left inferior axillary - METASTATIC POORLY DIFFERENTIATED CARCINOMA TO A LYMPH NODE. SEE NOTE.   05/01/2017 Cancer Staging   Staging form: Breast, AJCC 8th Edition - Clinical stage from 05/01/2017: Stage Unknown (cTX, cN1, cM0, G3, ER-, PR-, HER2-) - Signed by [name] on 06/02/2017    05/01/2017 Receptors her2   Lymph Node Biopsy:  HER2-Negative  PR-Negative  ER- Negative    05/10/2017 Imaging   MR Breast W WO Contrast 05/10/17 IMPRESSION: No MRI evidence of malignancy in  the right breast. Area of week stippled non mass enhancement in the left breast upper inner quadrant. Separate area of thin linear non mass enhancement in the subareolar left breast. Three grossly abnormal left axillary lymph nodes, and more than 4 less than 1 cm indeterminate left axillary lymph nodes. No evidence of right axillary lymphadenopathy.   05/17/2017 Initial Biopsy   Diagnosis 05/17/17 1. 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 2. Breast, left, needle core biopsy, upper inner post MR enhancement - MICROSCOPIC FOCUS OF DUCTAL CARCINOMA IN SITU.   05/17/2017 Receptors her2   Left Breast Biopsy:  ER-Negative PR-Negative HER2-Negative   05/22/2017 Initial Diagnosis   Malignant neoplasm of upper-inner quadrant of left breast in female, estrogen receptor positive ([place]   06/01/2017 Imaging   [place] Scan 06/01/17 IMPRESSION: No definite scintigraphic evidence of osseous metastatic disease.  Posttraumatic and postsurgical uptake at the LEFT knee.  Nonspecific soft tissue distribution of tracer at the LEFT thigh, could represent contusion, hemorrhage, soft tissue edema, or soft tissue calcifications such as from heterotopic calcification and myositis ossificans; recommend clinical correlation and consider dedicated LEFT femoral radiographs.  Single focus of nonspecific increased tracer localization at the lateral LEFT orbit.    06/01/2017 Imaging   06/01/2017 Bone Scan IMPRESSION: No definite scintigraphic evidence of osseous metastatic disease.  Posttraumatic and postsurgical uptake at the LEFT knee.  Nonspecific soft tissue distribution of tracer at the LEFT thigh, could represent contusion, hemorrhage, soft tissue edema, or soft tissue calcifications such as from heterotopic calcification and myositis ossificans; recommend clinical correlation and consider dedicated LEFT femoral  radiographs.  Single focus  of nonspecific increased tracer localization at the lateral LEFT orbit.   06/09/2017 -  Chemotherapy   ddAC every 2 weeks for 4 weeks starting 06/09/17-07/21/17 followed by weekly [place] and taxol for 12 weeks 08/04/17-10/20/17.    10/24/2017 Imaging   Breast MRI B/l 10/24/17 IMPRESSION: 1. No residual enhancement in the LEFT breast following neoadjuvant treatment. 2. Significantly smaller LEFT axillary lymph nodes, largest now measuring 1.4 centimeters. The remainder of LEFT axillary lymph nodes demonstrate normal fatty hila. RECOMMENDATION: Treatment plan for known LEFT breast cancer.   11/20/2017 Cancer Staging   Staging form: Breast, AJCC 8th Edition - Pathologic stage from 11/20/2017: No Stage Recommended (ypT0, pN90m, cM0, GX, ER-, PR-, HER2-) - Signed by [name] on 12/10/2017   11/20/2017 Surgery   Left mastectomy and SLN biopsy by Dr. IDalbert Batman   11/20/2017 Pathology Results   Breast, modified radical mastectomy , left - MICROSCOPIC FOCI OF RESIDUAL METASTATIC CARCINOMA INVOLVING TWO OF EIGHT LYMPH NODES (2/8); LARGEST CONTINUOUS FOCUS MEASURES 0.1 CM. - NO EVIDENCE OF RESIDUAL CARCINOMA IN THE MASTECTOMY SPECIMEN - MARKED THERAPY-RELATED CHANGES, INCLUDING DENSE HYALIN FIBROSIS     11/20/2017 Receptors her2   ER- PR- HER2- (IHC 1+)   01/02/2018 - 02/14/2018 Radiation Therapy   Adjuvant Radiation 01/02/18 - 02/14/18   07/31/2018 Imaging   Baseline DEXA 07/31/18  ASSESSMENT: The BMD measured at AP Spine L1-L4 is 0.973 g/cm2 with a T-score of -1.7.   This patient is considered OSTEOPENIC according to [facility] criteria. The scan quality is good.   Site Region Measured Date Measured Age YA T-score BMD Significant CHANGE   AP Spine  L1-L4      07/31/2018    63.1         -1.7    0.973 g/cm2   DualFemur Neck Left  07/31/2018    63.1         -1.5    0.828 g/cm2   DualFemur Total Mean 07/31/2018    63.1         0.1      1.016 g/cm2   11/05/2018 Survivorship   Per LCira Rue NP      CURRENT THERAPY:  Surveillance  INTERVAL HISTORY: Ms. HDagleyreturns for follow-up as scheduled she was last seen for routine surveillance visit on 10/08/2019.   REVIEW OF SYSTEMS:   Constitutional: Denies fevers, chills or abnormal weight loss Eyes: Denies blurriness of vision Ears, nose, mouth, throat, and face: Denies mucositis or sore throat Respiratory: Denies cough, dyspnea or wheezes Cardiovascular: Denies palpitation, chest discomfort or lower extremity swelling Gastrointestinal:  Denies nausea, heartburn or change in bowel habits Skin: Denies abnormal skin rashes Lymphatics: Denies new lymphadenopathy or easy bruising Neurological:Denies numbness, tingling or new weaknesses Behavioral/Psych: Mood is stable, no new changes  All other systems were reviewed with the patient and are negative.  MEDICAL HISTORY:  Past Medical History:  Diagnosis Date  . Cancer ([place] 03/2017   left breast cancer  . Diabetes mellitus without complication ([place]   . Hyperlipidemia   . Hypertension   . Malignant neoplasm of left breast ([facility]   . Stroke (cerebrum) ([place]   . Tibial plateau fracture, left    04-13-17 had ORIF    SURGICAL HISTORY: Past Surgical History:  Procedure Laterality Date  . MASTECTOMY Left 2019  . MASTECTOMY MODIFIED RADICAL Left 11/20/2017   Procedure: LEFT MODIFIED RADICAL MASTECTOMY;  Surgeon: IFanny Skates MD;  [place]  Service: General;  Laterality:  Left;  . ORIF TIBIA PLATEAU Left 04/13/2017   Procedure: OPEN REDUCTION INTERNAL FIXATION (ORIF) TIBIAL PLATEAU;  Surgeon: Shona Needles, MD;  Location: [place];  Service: Orthopedics;  Laterality: Left;  . PORT-A-CATH REMOVAL Right 11/20/2017   Procedure: REMOVAL PORT-A-CATH;  Surgeon: Fanny Skates, MD;  Location: [place];  Service: General;  Laterality: Right;  . PORTACATH PLACEMENT Right 05/31/2017   Procedure: INSERTION  PORT-A-CATH;  Surgeon: Fanny Skates, MD;  Location: [place];  Service: General;  Laterality: Right;    I have reviewed the social history and family history with the patient and they are unchanged from previous note.  ALLERGIES:  is allergic to victoza [liraglutide].  MEDICATIONS:  Current Outpatient Medications  Medication Sig Dispense Refill  . ACCU-CHEK FASTCLIX LANCETS MISC Use as instructed. 100 each 12  . atorvastatin (LIPITOR) 40 MG tablet Take 1 tablet (40 mg total) by mouth daily. 90 tablet 2  . Blood Glucose Monitoring Suppl (ACCU-CHEK GUIDE ME) w/Device KIT 1 kit by Does not apply route daily. 1 kit 0  . Blood Pressure Monitor DEVI Please provide patient with insurance approved blood pressure monitor 1 each 0  . citalopram (CELEXA) 20 MG tablet Take 1 tablet (20 mg total) by mouth daily. 90 tablet 1  . dicyclomine (BENTYL) 20 MG tablet Take 1 tablet (20 mg total) by mouth 4 (four) times daily. 60 tablet 1  . empagliflozin (JARDIANCE) 10 MG TABS tablet Take 10 mg by mouth daily before breakfast. 30 tablet 3  . fenofibrate (TRICOR) 145 MG tablet Take 1 tablet (145 mg total) by mouth daily. 90 tablet 2  . glimepiride (AMARYL) 4 MG tablet Take 2 tablets (8 mg total) by mouth daily before breakfast. 180 tablet 0  . glucose blood (ACCU-CHEK GUIDE) test strip Use as instructed 100 each 11  . hydrochlorothiazide (HYDRODIURIL) 25 MG tablet Take 1 tablet (25 mg total) by mouth daily. 90 tablet 3  . hydrocortisone 2.5 % cream Apply topically 2 (two) times daily. once daily for 2-4 weeks to the affected areas OUTSIDE the vagina not inside 30 g 0  . insulin glargine (LANTUS SOLOSTAR) 100 UNIT/ML Solostar Pen Inject 10 Units into the skin daily. 5 pen PRN  . Insulin Pen Needle (B-D UF III MINI PEN NEEDLES) 31G X 5 MM MISC Use as instructed. Inject into the skin once nightly. 100 each 6  . losartan (COZAAR) 50 MG tablet Take 1 tablet (50 mg total) by mouth daily. 90 tablet 3   . Na Sulfate-K Sulfate-Mg Sulf (SUPREP BOWEL PREP KIT) 17.5-3.13-1.6 GM/177ML SOLN Take 1 kit by mouth as directed. For colonoscopy prep 354 mL 0  . sitaGLIPtin-metformin (JANUMET) 50-1000 MG tablet Take 1 tablet by mouth 2 (two) times daily with a meal. 180 tablet 1   Current Facility-Administered Medications  Medication Dose Route Frequency Provider Last Rate Last Admin  . 0.9 %  sodium chloride infusion  500 mL Intravenous Once [place], MD        PHYSICAL EXAMINATION: ECOG PERFORMANCE STATUS: {CHL ONC ECOG IN:8676720947}  There were no vitals filed for this visit. There were no vitals filed for this visit.  GENERAL:alert, no distress and comfortable SKIN: skin color, texture, turgor are normal, no rashes or significant lesions EYES: normal, Conjunctiva are pink and non-injected, sclera clear OROPHARYNX:no exudate, no erythema and lips, buccal mucosa, and tongue normal  NECK: supple, thyroid normal size, non-tender, without nodularity LYMPH:  no palpable lymphadenopathy in the cervical, axillary or  inguinal LUNGS: clear to auscultation and percussion with normal breathing effort HEART: regular rate & rhythm and no murmurs and no lower extremity edema ABDOMEN:abdomen soft, non-tender and normal bowel sounds Musculoskeletal:no cyanosis of digits and no clubbing  NEURO: alert & oriented x 3 with fluent speech, no focal motor/sensory deficits  LABORATORY DATA:  I have reviewed the data as listed CBC Latest Ref Rng & Units 10/08/2019 06/05/2019 02/04/2019  WBC 4.0 - 10.5 K/uL 8.2 9.0 8.3  Hemoglobin 12.0 - 15.0 g/dL 14.0 13.7 13.4  Hematocrit 36.0 - 46.0 % 41.6 41.3 39.1  Platelets 150 - 400 K/uL 220 166 171     CMP Latest Ref Rng & Units 10/08/2019 08/16/2019 06/05/2019  Glucose 70 - 99 mg/dL 175(H) 161(H) 206(H)  BUN 8 - 23 mg/dL _0 Creatinine 0.44 - 1.00 mg/dL 0.81 0.70 0.75  Sodium 135 - 145 mmol/L 138 135 140  Potassium 3.5 - 5.1 mmol/L 4.5 3.8 4.0  Chloride  98 - 111 mmol/L 103 102 103  CO2 22 - 32 mmol/L _1 Calcium 8.9 - 10.3 mg/dL 9.6 9.9 9.6  Total Protein 6.5 - 8.1 g/dL 8.5(H) 8.4(H) 8.4(H)  Total Bilirubin 0.3 - 1.2 mg/dL 0.8 0.5 0.4  Alkaline Phos 38 - 126 U/L 74 66 101  AST 15 - 41 U/L _2 ALT 0 - 44 U/L _3 RADIOGRAPHIC STUDIES: I have personally reviewed the radiological images as listed and agreed with the findings in the report. No results found.   ASSESSMENT & PLAN:  No problem-specific Assessment & Plan notes found for this encounter.   No orders of the defined types were placed in this encounter.  All questions were answered. The patient knows to call the clinic with any problems, questions or concerns. No barriers to learning was detected. I spent {CHL ONC TIME VISIT - FVWAQ:[id number]} counseling the patient face to face. The total time spent in the appointment was {CHL ONC TIME VISIT - TELMR:[id number]} and more than 50% was on counseling and review of test results     Alla Feeling, NP 02/10/20

## 2020-02-12 ENCOUNTER — Telehealth: Payer: Self-pay | Admitting: Hematology

## 2020-02-12 NOTE — Telephone Encounter (Signed)
Called pt per 1/17 sch msg - no answer. Left message for pt to call back to reschedule appt.

## 2020-03-02 ENCOUNTER — Telehealth: Payer: Self-pay | Admitting: Nurse Practitioner

## 2020-03-02 NOTE — Telephone Encounter (Signed)
Called pt per 2/7 sch msg - pt is aware of appt date and time    Interpreter ID [id number] name- Evette.

## 2020-03-08 NOTE — Progress Notes (Signed)
Error

## 2020-03-09 ENCOUNTER — Inpatient Hospital Stay (HOSPITAL_BASED_OUTPATIENT_CLINIC_OR_DEPARTMENT_OTHER): Payer: Medicare Other | Admitting: Nurse Practitioner

## 2020-03-09 ENCOUNTER — Inpatient Hospital Stay: Payer: Medicare Other | Attending: Nurse Practitioner

## 2020-03-09 ENCOUNTER — Encounter: Payer: Self-pay | Admitting: Nurse Practitioner

## 2020-03-09 ENCOUNTER — Other Ambulatory Visit: Payer: Self-pay

## 2020-03-09 VITALS — BP 130/65 | HR 67 | Temp 97.6°F | Resp 17 | Ht 59.5 in | Wt 124.4 lb

## 2020-03-09 DIAGNOSIS — E119 Type 2 diabetes mellitus without complications: Secondary | ICD-10-CM | POA: Diagnosis not present

## 2020-03-09 DIAGNOSIS — R1032 Left lower quadrant pain: Secondary | ICD-10-CM

## 2020-03-09 DIAGNOSIS — M8580 Other specified disorders of bone density and structure, unspecified site: Secondary | ICD-10-CM

## 2020-03-09 DIAGNOSIS — Z170 Estrogen receptor positive status [ER+]: Secondary | ICD-10-CM

## 2020-03-09 DIAGNOSIS — C50212 Malignant neoplasm of upper-inner quadrant of left female breast: Secondary | ICD-10-CM

## 2020-03-09 DIAGNOSIS — G629 Polyneuropathy, unspecified: Secondary | ICD-10-CM

## 2020-03-09 DIAGNOSIS — G8929 Other chronic pain: Secondary | ICD-10-CM

## 2020-03-09 DIAGNOSIS — Z853 Personal history of malignant neoplasm of breast: Secondary | ICD-10-CM

## 2020-03-09 DIAGNOSIS — Z8673 Personal history of transient ischemic attack (TIA), and cerebral infarction without residual deficits: Secondary | ICD-10-CM

## 2020-03-09 DIAGNOSIS — Z9012 Acquired absence of left breast and nipple: Secondary | ICD-10-CM

## 2020-03-09 DIAGNOSIS — R102 Pelvic and perineal pain: Secondary | ICD-10-CM

## 2020-03-09 DIAGNOSIS — I10 Essential (primary) hypertension: Secondary | ICD-10-CM

## 2020-03-09 DIAGNOSIS — M546 Pain in thoracic spine: Secondary | ICD-10-CM

## 2020-03-09 DIAGNOSIS — Z79899 Other long term (current) drug therapy: Secondary | ICD-10-CM

## 2020-03-09 LAB — CBC WITH DIFFERENTIAL (CANCER CENTER ONLY)
Abs Immature Granulocytes: 0.02 10*3/uL (ref 0.00–0.07)
Basophils Absolute: 0 10*3/uL (ref 0.0–0.1)
Basophils Relative: 1 %
Eosinophils Absolute: 1.5 10*3/uL — ABNORMAL HIGH (ref 0.0–0.5)
Eosinophils Relative: 21 %
HCT: 43.2 % (ref 36.0–46.0)
Hemoglobin: 14.8 g/dL (ref 12.0–15.0)
Immature Granulocytes: 0 %
Lymphocytes Relative: 35 %
Lymphs Abs: 2.6 10*3/uL (ref 0.7–4.0)
MCH: 31.4 pg (ref 26.0–34.0)
MCHC: 34.3 g/dL (ref 30.0–36.0)
MCV: 91.5 fL (ref 80.0–100.0)
Monocytes Absolute: 0.4 10*3/uL (ref 0.1–1.0)
Monocytes Relative: 6 %
Neutro Abs: 2.8 10*3/uL (ref 1.7–7.7)
Neutrophils Relative %: 37 %
Platelet Count: 183 10*3/uL (ref 150–400)
RBC: 4.72 MIL/uL (ref 3.87–5.11)
RDW: 13.2 % (ref 11.5–15.5)
WBC Count: 7.4 10*3/uL (ref 4.0–10.5)
nRBC: 0 % (ref 0.0–0.2)

## 2020-03-09 LAB — CMP (CANCER CENTER ONLY)
ALT: 20 U/L (ref 0–44)
AST: 20 U/L (ref 15–41)
Albumin: 4 g/dL (ref 3.5–5.0)
Alkaline Phosphatase: 133 U/L — ABNORMAL HIGH (ref 38–126)
Anion gap: 8 (ref 5–15)
BUN: 13 mg/dL (ref 8–23)
CO2: 26 mmol/L (ref 22–32)
Calcium: 9.4 mg/dL (ref 8.9–10.3)
Chloride: 99 mmol/L (ref 98–111)
Creatinine: 0.8 mg/dL (ref 0.44–1.00)
GFR, Estimated: >60 mL/min (ref >60)
Glucose, Bld: 400 mg/dL — ABNORMAL HIGH (ref 70–99)
Potassium: 4 mmol/L (ref 3.5–5.1)
Sodium: 133 mmol/L — ABNORMAL LOW (ref 135–145)
Total Bilirubin: 0.6 mg/dL (ref 0.3–1.2)
Total Protein: 8.5 g/dL — ABNORMAL HIGH (ref 6.5–8.1)

## 2020-03-09 LAB — GLUCOSE, CAPILLARY: Glucose-Capillary: 386 mg/dL — ABNORMAL HIGH (ref 70–99)
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
## 2020-03-10 ENCOUNTER — Telehealth: Payer: Self-pay | Admitting: Hematology

## 2020-03-10 NOTE — Telephone Encounter (Signed)
Scheduled follow-up appointment per 2/14 los. Patient's daughter is aware.

## 2020-03-15 NOTE — Progress Notes (Signed)
Please have the front desk schedule Amanda Duarte for an appointment. She also needs to increase her lantus to 15 units. Thank you

## 2020-03-18 ENCOUNTER — Other Ambulatory Visit: Payer: Self-pay | Admitting: Nurse Practitioner

## 2020-03-18 DIAGNOSIS — I10 Essential (primary) hypertension: Secondary | ICD-10-CM

## 2020-03-20 ENCOUNTER — Other Ambulatory Visit: Payer: Self-pay | Admitting: Family Medicine

## 2020-03-20 NOTE — Progress Notes (Signed)
Spoke to patient to increase her lantus 15 units. Patient understood and scheduled her an appointment with PCP for diabetes follow up. Patient was informed to bring all her medication and her sugar meter to the office.  [place] interpreter assist w/ the call.

## 2020-03-23 ENCOUNTER — Ambulatory Visit
Admission: RE | Admit: 2020-03-23 | Discharge: 2020-03-23 | Disposition: A | Discharge status: Home / Self Care | Payer: Medicare Other | Source: Ambulatory Visit | Attending: Surgery | Admitting: Surgery

## 2020-03-23 ENCOUNTER — Other Ambulatory Visit: Payer: Self-pay

## 2020-03-23 DIAGNOSIS — Z1231 Encounter for screening mammogram for malignant neoplasm of breast: Secondary | ICD-10-CM

## 2020-03-31 ENCOUNTER — Encounter: Payer: Self-pay | Admitting: Nurse Practitioner

## 2020-03-31 ENCOUNTER — Other Ambulatory Visit: Payer: Self-pay | Admitting: Nurse Practitioner

## 2020-03-31 ENCOUNTER — Other Ambulatory Visit: Payer: Self-pay

## 2020-03-31 ENCOUNTER — Ambulatory Visit: Payer: Medicare Other | Attending: Nurse Practitioner | Admitting: Nurse Practitioner

## 2020-03-31 DIAGNOSIS — E118 Type 2 diabetes mellitus with unspecified complications: Secondary | ICD-10-CM | POA: Diagnosis not present

## 2020-03-31 DIAGNOSIS — F321 Major depressive disorder, single episode, moderate: Secondary | ICD-10-CM | POA: Diagnosis not present

## 2020-03-31 DIAGNOSIS — E781 Pure hyperglyceridemia: Secondary | ICD-10-CM | POA: Diagnosis not present

## 2020-03-31 DIAGNOSIS — I10 Essential (primary) hypertension: Secondary | ICD-10-CM

## 2020-03-31 MED ORDER — HYDROCHLOROTHIAZIDE 25 MG PO TABS: 25.0000 mg | ORAL_TABLET | Freq: Every day | ORAL | 0 refills | Status: DC

## 2020-03-31 MED ORDER — CITALOPRAM HYDROBROMIDE 20 MG PO TABS: 20.0000 mg | ORAL_TABLET | Freq: Every day | ORAL | 1 refills | Status: DC

## 2020-03-31 MED ORDER — BD PEN NEEDLE MINI U/F 31G X 5 MM MISC: 6 refills | Status: DC

## 2020-03-31 MED ORDER — ATORVASTATIN CALCIUM 40 MG PO TABS: 40.0000 mg | ORAL_TABLET | Freq: Every day | ORAL | 2 refills | Status: DC

## 2020-03-31 MED ORDER — FENOFIBRATE 145 MG PO TABS: 145.0000 mg | ORAL_TABLET | Freq: Every day | ORAL | 2 refills | Status: DC

## 2020-03-31 MED ORDER — JANUMET 50-1000 MG PO TABS: 1.0000 | ORAL_TABLET | Freq: Two times a day (BID) | ORAL | 1 refills | Status: DC

## 2020-03-31 MED ORDER — GLIMEPIRIDE 4 MG PO TABS: 8.0000 mg | ORAL_TABLET | Freq: Every day | ORAL | 0 refills | Status: DC

## 2020-03-31 MED ORDER — LANTUS SOLOSTAR 100 UNIT/ML ~~LOC~~ SOPN: 10.0000 [IU] | PEN_INJECTOR | Freq: Every day | SUBCUTANEOUS | 6 refills | Status: DC

## 2020-03-31 MED ORDER — LANTUS SOLOSTAR 100 UNIT/ML ~~LOC~~ SOPN: 15.0000 [IU] | PEN_INJECTOR | Freq: Every day | SUBCUTANEOUS | 0 refills | Status: DC

## 2020-03-31 MED ORDER — LOSARTAN POTASSIUM 50 MG PO TABS: 50.0000 mg | ORAL_TABLET | Freq: Every day | ORAL | 0 refills | Status: DC

## 2020-03-31 NOTE — Progress Notes (Signed)
Virtual Visit via Telephone Note Due to national recommendations of social distancing due to [place] 19, telehealth visit is felt to be most appropriate for this patient at this time.  I discussed the limitations, risks, security and privacy concerns of performing an evaluation and management service by telephone and the availability of in person appointments. I also discussed with the patient that there may be a patient responsible charge related to this service. The patient expressed understanding and agreed to proceed.    I connected with Amanda Duarte on 03/31/20  at  10:50 AM EST  EDT by telephone and verified that I am speaking with the correct person using two identifiers.   Consent I discussed the limitations, risks, security and privacy concerns of performing an evaluation and management service by telephone and the availability of in person appointments. I also discussed with the patient that there may be a patient responsible charge related to this service. The patient expressed understanding and agreed to proceed.   Location of Patient: Private Residence   Location of Provider: [place] and Ama participating in Telemedicine visit: Amanda Rankins FNP-BC [place] Interpreter ID# [id number]   History of Present Illness: Telemedicine visit for: Follow up She has a past medical history of Cancer ([place]) (03/2017), Diabetes mellitus without complication ([place]), Hyperlipidemia, Hypertension, Malignant neoplasm of left breast ([place]), Stroke (cerebrum however I am unable to locate record of this) and Tibial plateau fracture, left.   She is being followed for her history of left breast cancer.   DM 2 Poorly controlled.  She cannot recall today what medications she is in need of refills.  She was instructed by oncology last month to increase her Lantus to 15 units however it does not appear that she has done so at  this time and is still taking 10 units.  It is not clear whether there is a breakdown due to language barrier or actual intentional nonadherence with medication administration as we have been trying for some time down to get her A1c down to goal of less than 7.  She is also prescribed Janumet 50-1000 mg twice daily, glimepiride 8 mg daily and Jardiance 10 mg daily.  LDL is still not at goal despite being prescribed high intensity statin and TriCor. Lab Results  Component Value Date   HGBA1C 9.2 (H) 07/16/2019   Lab Results  Component Value Date   LDLCALC 103 (H) 07/16/2019    Past Medical History:  Diagnosis Date  . Cancer ([place]) 03/2017   left breast cancer  . Diabetes mellitus without complication ([place])   . Hyperlipidemia   . Hypertension   . Malignant neoplasm of left breast ([place])   . Stroke (cerebrum) ([place])   . Tibial plateau fracture, left    04-13-17 had ORIF    Past Surgical History:  Procedure Laterality Date  . MASTECTOMY Left 2019  . MASTECTOMY MODIFIED RADICAL Left 11/20/2017   Procedure: LEFT MODIFIED RADICAL MASTECTOMY;  Surgeon: Fanny Skates, MD;  Location: [place];  Service: General;  Laterality: Left;  . ORIF TIBIA PLATEAU Left 04/13/2017   Procedure: OPEN REDUCTION INTERNAL FIXATION (ORIF) TIBIAL PLATEAU;  Surgeon: Shona Needles, MD;  Location: [place];  Service: Orthopedics;  Laterality: Left;  . PORT-A-CATH REMOVAL Right 11/20/2017   Procedure: REMOVAL PORT-A-CATH;  Surgeon: Fanny Skates, MD;  Location: [place];  Service: General;  Laterality: Right;  . PORTACATH PLACEMENT Right 05/31/2017  Procedure: INSERTION PORT-A-CATH;  Surgeon: Fanny Skates, MD;  Location: [place];  Service: General;  Laterality: Right;    Family History  Problem Relation Age of Onset  . Heart Problems Mother   . Diabetes Son   . Breast cancer Neg Hx   . Colon cancer Neg Hx   . Rectal cancer Neg Hx   . Stomach cancer Neg Hx   . Esophageal cancer Neg Hx     Social  History   Socioeconomic History  . Marital status: Married    Spouse name: Not on file  . Number of children: Not on file  . Years of education: Not on file  . Highest education level: Not on file  Occupational History  . Not on file  Tobacco Use  . Smoking status: Never Smoker  . Smokeless tobacco: Never Used  Vaping Use  . Vaping Use: Never used  Substance and Sexual Activity  . Alcohol use: No  . Drug use: No  . Sexual activity: Not Currently    Birth control/protection: None  Other Topics Concern  . Not on file  Social History Narrative  . Not on file   Social Determinants of Health   Financial Resource Strain: Not on file  Food Insecurity: Not on file  Transportation Needs: Not on file  Physical Activity: Not on file  Stress: Not on file  Social Connections: Not on file     Observations/Objective: Awake, alert and oriented x 3   Review of Systems  Constitutional: Negative for fever, malaise/fatigue and weight loss.  HENT: Negative.  Negative for nosebleeds.   Eyes: Negative.  Negative for blurred vision, double vision and photophobia.  Respiratory: Negative.  Negative for cough and shortness of breath.   Cardiovascular: Negative.  Negative for chest pain, palpitations and leg swelling.  Gastrointestinal: Negative.  Negative for heartburn, nausea and vomiting.  Musculoskeletal: Negative.  Negative for myalgias.  Neurological: Negative.  Negative for dizziness, focal weakness, seizures and headaches.  Psychiatric/Behavioral: Negative.  Negative for suicidal ideas.    Assessment and Plan: Diagnoses and all orders for this visit:  Pure hypertriglyceridemia -     fenofibrate (TRICOR) 145 MG tablet; Take 1 tablet (145 mg total) by mouth daily. -     atorvastatin (LIPITOR) 40 MG tablet; Take 1 tablet (40 mg total) by mouth daily. -     Lipid panel; Future  Type 2 diabetes mellitus with complication, without long-term current use of insulin (HCC) -      glimepiride (AMARYL) 4 MG tablet; Take 2 tablets (8 mg total) by mouth daily before breakfast. -     sitaGLIPtin-metformin (JANUMET) 50-1000 MG tablet; Take 1 tablet by mouth 2 (two) times daily with a meal. -     Hemoglobin A1c; Future  Current moderate episode of major depressive disorder without prior episode (HCC) -     citalopram (CELEXA) 20 MG tablet; Take 1 tablet (20 mg total) by mouth daily.  Essential hypertension -     losartan (COZAAR) 50 MG tablet; Take 1 tablet (50 mg total) by mouth daily. -     hydrochlorothiazide (HYDRODIURIL) 25 MG tablet; Take 1 tablet (25 mg total) by mouth daily.  Other orders -     Discontinue: insulin glargine (LANTUS SOLOSTAR) 100 UNIT/ML Solostar Pen; Inject 10 Units into the skin daily. -     Insulin Pen Needle (B-D UF III MINI PEN NEEDLES) 31G X 5 MM MISC; Use as instructed. Inject into the skin  once nightly. -     insulin glargine (LANTUS SOLOSTAR) 100 UNIT/ML Solostar Pen; Inject 15 Units into the skin daily.     Follow Up Instructions Return in about 3 months (around 07/01/2020).     I discussed the assessment and treatment plan with the patient. The patient was provided an opportunity to ask questions and all were answered. The patient agreed with the plan and demonstrated an understanding of the instructions.   The patient was advised to call back or seek an in-person evaluation if the symptoms worsen or if the condition fails to improve as anticipated.  I provided 14 minutes of non-face-to-face time during this encounter including median intraservice time, reviewing previous notes, labs, imaging, medications and explaining diagnosis and management.  [name], FNP-BC

## 2020-06-30 ENCOUNTER — Ambulatory Visit: Payer: Medicare Other | Attending: Nurse Practitioner | Admitting: Nurse Practitioner

## 2020-06-30 ENCOUNTER — Encounter: Payer: Self-pay | Admitting: Nurse Practitioner

## 2020-06-30 ENCOUNTER — Other Ambulatory Visit: Payer: Self-pay

## 2020-06-30 ENCOUNTER — Encounter: Payer: Self-pay | Admitting: Hematology

## 2020-06-30 VITALS — BP 133/64 | HR 65 | Ht 59.0 in | Wt 125.4 lb

## 2020-06-30 DIAGNOSIS — F321 Major depressive disorder, single episode, moderate: Secondary | ICD-10-CM | POA: Insufficient documentation

## 2020-06-30 DIAGNOSIS — Z7901 Long term (current) use of anticoagulants: Secondary | ICD-10-CM | POA: Insufficient documentation

## 2020-06-30 DIAGNOSIS — E781 Pure hyperglyceridemia: Secondary | ICD-10-CM | POA: Diagnosis not present

## 2020-06-30 DIAGNOSIS — E119 Type 2 diabetes mellitus without complications: Secondary | ICD-10-CM | POA: Diagnosis present

## 2020-06-30 DIAGNOSIS — E785 Hyperlipidemia, unspecified: Secondary | ICD-10-CM | POA: Diagnosis not present

## 2020-06-30 DIAGNOSIS — Z23 Encounter for immunization: Secondary | ICD-10-CM | POA: Diagnosis not present

## 2020-06-30 DIAGNOSIS — E118 Type 2 diabetes mellitus with unspecified complications: Secondary | ICD-10-CM

## 2020-06-30 DIAGNOSIS — Z79899 Other long term (current) drug therapy: Secondary | ICD-10-CM | POA: Insufficient documentation

## 2020-06-30 DIAGNOSIS — Z8673 Personal history of transient ischemic attack (TIA), and cerebral infarction without residual deficits: Secondary | ICD-10-CM | POA: Diagnosis not present

## 2020-06-30 DIAGNOSIS — I10 Essential (primary) hypertension: Secondary | ICD-10-CM | POA: Diagnosis not present

## 2020-06-30 DIAGNOSIS — Z833 Family history of diabetes mellitus: Secondary | ICD-10-CM | POA: Diagnosis not present

## 2020-06-30 DIAGNOSIS — Z794 Long term (current) use of insulin: Secondary | ICD-10-CM | POA: Insufficient documentation

## 2020-06-30 LAB — POCT GLYCOSYLATED HEMOGLOBIN (HGB A1C): HbA1c, POC (controlled diabetic range): 8.8 % — AB (ref 0.0–7.0)

## 2020-06-30 LAB — GLUCOSE, POCT (MANUAL RESULT ENTRY): POC Glucose: 50 mg/dl — AB (ref 70–99)

## 2020-06-30 MED ORDER — GLIMEPIRIDE 4 MG PO TABS
4.0000 mg | ORAL_TABLET | Freq: Two times a day (BID) | ORAL | 1 refills | Status: DC
  Filled 2020-06-30: qty 60, 30d supply, fill #0
  Filled 2020-09-04: qty 60, 30d supply, fill #1

## 2020-06-30 MED ORDER — FENOFIBRATE 145 MG PO TABS
ORAL_TABLET | Freq: Every day | ORAL | 2 refills | Status: DC
  Filled 2020-06-30: qty 30, 30d supply, fill #0
  Filled 2020-07-22: qty 30, 30d supply, fill #1

## 2020-06-30 MED ORDER — CITALOPRAM HYDROBROMIDE 20 MG PO TABS
ORAL_TABLET | Freq: Every day | ORAL | 3 refills | Status: DC
Start: 2020-06-30 — End: 2020-11-13
  Filled 2020-06-30: qty 30, 30d supply, fill #0
  Filled 2020-09-04: qty 30, 30d supply, fill #1

## 2020-06-30 NOTE — Progress Notes (Signed)
Assessment & Plan:  Diagnoses and all orders for this visit:  Type 2 diabetes mellitus with complication, without long-term current use of insulin (HCC) -     POCT glucose (manual entry) -     POCT glycosylated hemoglobin (Hb A1C) -     glimepiride (AMARYL) 4 MG tablet; Take 1 tablet (4 mg total) by mouth in the morning and at bedtime. -     CMP14+EGFR Continue blood sugar control as discussed in office today, low carbohydrate diet, and regular physical exercise as tolerated, 150 minutes per week (30 min each day, 5 days per week, or 50 min 3 days per week). Keep blood sugar logs with fasting goal of 90-130 mg/dl, post prandial (after you eat) less than 180.  For Hypoglycemia: BS <60 and Hyperglycemia BS >400; contact the clinic ASAP. Annual eye exams and foot exams are recommended.   Current moderate episode of major depressive disorder without prior episode (HCC) -     citalopram (CELEXA) 20 MG tablet; TAKE 1 TABLET BY MOUTH ONCE A DAY  Dyslipidemia, goal LDL below 70 -     Lipid panel INSTRUCTIONS: Work on a low fat, heart healthy diet and participate in regular aerobic exercise program by working out at least 150 minutes per week; 5 days a week-30 minutes per day. Avoid red meat/beef/steak,  fried foods. junk foods, sodas, sugary drinks, unhealthy snacking, alcohol and smoking.  Drink at least 80 oz of water per day and monitor your carbohydrate intake daily.    Need for vaccination against Streptococcus pneumoniae using pneumococcal conjugate vaccine 13 -     Pneumococcal conjugate vaccine 13-valent IM    Patient has been counseled on age-appropriate routine health concerns for screening and prevention. These are reviewed and up-to-date. Referrals have been placed accordingly. Immunizations are up-to-date or declined.    Subjective:   HPI Amanda Duarte 65 y.o. female presents to office today for follow up.  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  VRI was used to communicate directly with patient for the entire encounter including providing detailed patient instructions.    Essential Hypertension Well controlled. Taking losartan 50 mg daily and HCTZ 25 mg daily as prescribed. Denies chest pain, shortness of breath, palpitations, lightheadedness, dizziness, headaches or BLE edema.  BP Readings from Last 3 Encounters:  06/30/20 133/64  03/09/20 130/65  11/20/19 135/72   DM 2 She is hypoglycemic today in the 50s. She is taking one tablet of  janumet 50-1000 mg and 2 tablets of glimepiride  at lunchtime and the second tablet of janumet at dinnertime. She takes lantus 10 units in the am but does not eat breakfast. I have recommended she take 1 tablet of glimepiride and 1 tablet of janumet with lunch and the remaining tablet of glimepiride and janumet with dinner. She will switch  lantus to nighttime.  LDL not at goal with atorvastatin 40 mg daily and tricor 145 mg daily.  Lab Results  Component Value Date   HGBA1C 8.8 (A) 06/30/2020   Lab Results  Component Value Date   LDLCALC 103 (H) 07/16/2019    Review of Systems  Constitutional: Negative for fever, malaise/fatigue and weight loss.  HENT: Negative.  Negative for nosebleeds.   Eyes: Negative.  Negative for blurred vision, double vision and photophobia.  Respiratory: Negative.  Negative for cough and shortness of breath.   Cardiovascular: Negative.  Negative for chest pain, palpitations and leg swelling.  Gastrointestinal: Negative.  Negative for heartburn, nausea and vomiting.  Musculoskeletal: Negative.  Negative for myalgias.  Neurological: Negative.  Negative for dizziness, focal weakness, seizures and headaches.  Psychiatric/Behavioral: Positive for depression. Negative for suicidal ideas. The patient is nervous/anxious.        SYMPTOMS well controlled with celexa     Past Medical History:  Diagnosis Date  . Cancer ([place]) 03/2017   left breast cancer  . Diabetes mellitus without complication ([place])   . Hyperlipidemia   . Hypertension   . Malignant neoplasm of left breast ([place])   . Stroke (cerebrum) ([place])   . Tibial plateau fracture, left    04-13-17 had ORIF    Past Surgical History:  Procedure Laterality Date  . MASTECTOMY Left 2019  . MASTECTOMY MODIFIED RADICAL Left 11/20/2017   Procedure: LEFT MODIFIED RADICAL MASTECTOMY;  Surgeon: Fanny Skates, MD;  Location: [place];  Service: General;  Laterality: Left;  . ORIF TIBIA PLATEAU Left 04/13/2017   Procedure: OPEN REDUCTION INTERNAL FIXATION (ORIF) TIBIAL PLATEAU;  Surgeon: Shona Needles, MD;  Location: [place];  Service: Orthopedics;  Laterality: Left;  . PORT-A-CATH REMOVAL Right 11/20/2017   Procedure: REMOVAL PORT-A-CATH;  Surgeon: Fanny Skates, MD;  Location: [place];  Service: General;  Laterality: Right;  . PORTACATH PLACEMENT Right 05/31/2017   Procedure: INSERTION PORT-A-CATH;  Surgeon: Fanny Skates, MD;  Location: [place];  Service: General;  Laterality: Right;    Family History  Problem Relation Age of Onset  . Heart Problems Mother   . Diabetes Son   . Breast cancer Neg Hx   . Colon cancer Neg Hx   . Rectal cancer Neg Hx   . Stomach cancer Neg Hx   . Esophageal cancer Neg Hx     Social History Reviewed with no changes to be made today.   Outpatient Medications Prior to Visit  Medication Sig Dispense Refill  . ACCU-CHEK FASTCLIX LANCETS MISC Use as instructed. 100 each 12  . amoxicillin (AMOXIL) 500 MG capsule TAKE 1 CAPSULE EVERY 8 HOURS UNTIL ALL TAKEN. 21 capsule 0  . atorvastatin (LIPITOR) 40 MG tablet TAKE 1 TABLET BY MOUTH ONCE A DAY 90 tablet 2  . Blood Glucose Monitoring Suppl (ACCU-CHEK GUIDE ME) w/Device KIT 1 kit by Does not apply route daily. 1 kit 0  . Blood Pressure Monitor DEVI Please provide patient with insurance approved blood  pressure monitor 1 each 0  . dicyclomine (BENTYL) 20 MG tablet Take 1 tablet (20 mg total) by mouth 4 (four) times daily. 60 tablet 1  . fenofibrate (TRICOR) 145 MG tablet TAKE 1 TABLET BY MOUTH ONCE A DAY 90 tablet 2  . glucose blood (ACCU-CHEK GUIDE) test strip Use as instructed 100 each 11  . hydrochlorothiazide (HYDRODIURIL) 25 MG tablet TAKE 1 TABLET (25 MG TOTAL) BY MOUTH DAILY. 90 tablet 0  . hydrocortisone 2.5 % cream APPLY TOPICALLY 2 (TWO) TIMES DAILY. ONCE DAILY FOR 2-4 WEEKS TO THE AFFECTED AREAS OUTSIDE THE VAGINA NOT INSIDE 30 g 0  . insulin glargine (LANTUS) 100 UNIT/ML Solostar Pen INJECT 10 UNITS INTO THE SKIN DAILY. 9 mL 6  . Insulin Pen Needle 31G X 5 MM MISC USE AS INSTRUCTED. INJECT INTO THE SKIN ONCE NIGHTLY. 100 each 6  . losartan (COZAAR) 50 MG tablet TAKE 1 TABLET BY MOUTH ONCE A DAY 90 tablet 0  . Na Sulfate-K Sulfate-Mg Sulf (SUPREP BOWEL PREP KIT) 17.5-3.13-1.6  GM/177ML SOLN Take 1 kit by mouth as directed. For colonoscopy prep 354 mL 0  . sitaGLIPtin-metformin (JANUMET) 50-1000 MG tablet TAKE 1 TABLET BY MOUTH 2 TIMES DAILY 180 tablet 1  . citalopram (CELEXA) 20 MG tablet TAKE 1 TABLET BY MOUTH ONCE A DAY 90 tablet 1  . empagliflozin (JARDIANCE) 10 MG TABS tablet Take 10 mg by mouth daily before breakfast. 30 tablet 3  . glimepiride (AMARYL) 4 MG tablet TAKE 2 TABLETS BY MOUTH ONCE A DAY BEFORE BREAKFAST 180 tablet 0  . insulin glargine (LANTUS) 100 UNIT/ML Solostar Pen INJECT 15 UNITS INTO THE SKIN DAILY. 15 mL 0   Facility-Administered Medications Prior to Visit  Medication Dose Route Frequency Provider Last Rate Last Admin  . 0.9 %  sodium chloride infusion  500 mL Intravenous Once [place], MD        Allergies  Allergen Reactions  . Victoza [Liraglutide] Nausea And Vomiting       Objective:    BP 133/64   Pulse 65   Ht _0  (1.499 m)   Wt 125 lb 6.4 oz (56.9 kg)   SpO2 96%   BMI 25.33 kg/m  Wt Readings from Last 3 Encounters:  06/30/20  125 lb 6.4 oz (56.9 kg)  03/09/20 124 lb 6.4 oz (56.4 kg)  11/20/19 129 lb 12.8 oz (58.9 kg)    Physical Exam Vitals and nursing note reviewed.  Constitutional:      Appearance: She is well-developed.  HENT:     Head: Normocephalic and atraumatic.  Cardiovascular:     Rate and Rhythm: Normal rate and regular rhythm.     Heart sounds: Normal heart sounds. No murmur heard. No friction rub. No gallop.   Pulmonary:     Effort: Pulmonary effort is normal. No tachypnea or respiratory distress.     Breath sounds: Normal breath sounds. No decreased breath sounds, wheezing, rhonchi or rales.  Chest:     Chest wall: No tenderness.  Abdominal:     General: Bowel sounds are normal.     Palpations: Abdomen is soft.  Musculoskeletal:        General: Normal range of motion.     Cervical back: Normal range of motion.  Skin:    General: Skin is warm and dry.  Neurological:     Mental Status: She is alert and oriented to person, place, and time.     Coordination: Coordination normal.  Psychiatric:        Attention and Perception: Attention normal.        Mood and Affect: Mood normal.        Speech: Speech normal.        Behavior: Behavior normal. Behavior is cooperative.        Thought Content: Thought content normal.        Cognition and Memory: Cognition normal.        Judgment: Judgment normal.          Patient has been counseled extensively about nutrition and exercise as well as the importance of adherence with medications and regular follow-up. The patient was given clear instructions to go to ER or return to medical center if symptoms don't improve, worsen or new problems develop. The patient verbalized understanding.   Follow-up: Return in about 3 months (around 09/30/2020).   [name], FNP-BC [facility] [place], [place]   06/30/2020, 7:08 PM

## 2020-07-01 ENCOUNTER — Encounter: Payer: Self-pay | Admitting: Hematology

## 2020-07-01 ENCOUNTER — Other Ambulatory Visit: Payer: Self-pay

## 2020-07-01 LAB — CMP14+EGFR
ALT: 18 IU/L (ref 0–32)
AST: 29 IU/L (ref 0–40)
Albumin/Globulin Ratio: 1.2 (ref 1.2–2.2)
Albumin: 4.6 g/dL (ref 3.8–4.8)
Alkaline Phosphatase: 61 IU/L (ref 44–121)
BUN/Creatinine Ratio: 18 (ref 12–28)
BUN: 14 mg/dL (ref 8–27)
Bilirubin Total: 0.5 mg/dL (ref 0.0–1.2)
CO2: 24 mmol/L (ref 20–29)
Calcium: 9.8 mg/dL (ref 8.7–10.3)
Chloride: 97 mmol/L (ref 96–106)
Creatinine, Ser: 0.8 mg/dL (ref 0.57–1.00)
Globulin, Total: 3.9 g/dL (ref 1.5–4.5)
Glucose: 63 mg/dL — ABNORMAL LOW (ref 65–99)
Potassium: 3.1 mmol/L — ABNORMAL LOW (ref 3.5–5.2)
Sodium: 140 mmol/L (ref 134–144)
Total Protein: 8.5 g/dL (ref 6.0–8.5)
eGFR: 82 mL/min/{1.73_m2} (ref >59)

## 2020-07-01 LAB — LIPID PANEL
Chol/HDL Ratio: 2.7 ratio (ref 0.0–4.4)
Cholesterol, Total: 127 mg/dL (ref 100–199)
HDL: 47 mg/dL (ref >39)
LDL Chol Calc (NIH): 54 mg/dL (ref 0–99)
Triglycerides: 150 mg/dL — ABNORMAL HIGH (ref 0–149)
VLDL Cholesterol Cal: 26 mg/dL (ref 5–40)

## 2020-07-05 ENCOUNTER — Other Ambulatory Visit: Payer: Self-pay | Admitting: Nurse Practitioner

## 2020-07-05 MED ORDER — POTASSIUM CHLORIDE CRYS ER 20 MEQ PO TBCR
20.0000 meq | EXTENDED_RELEASE_TABLET | Freq: Every day | ORAL | 0 refills | Status: DC
  Filled 2020-07-05: qty 5, 5d supply, fill #0

## 2020-07-06 ENCOUNTER — Other Ambulatory Visit: Payer: Self-pay

## 2020-07-06 ENCOUNTER — Encounter: Payer: Self-pay | Admitting: Hematology

## 2020-07-08 ENCOUNTER — Other Ambulatory Visit: Payer: Self-pay

## 2020-07-22 ENCOUNTER — Other Ambulatory Visit: Payer: Self-pay

## 2020-07-22 ENCOUNTER — Encounter: Payer: Self-pay | Admitting: Hematology

## 2020-09-04 ENCOUNTER — Other Ambulatory Visit: Payer: Self-pay

## 2020-09-04 ENCOUNTER — Encounter: Payer: Self-pay | Admitting: Hematology

## 2020-09-04 MED FILL — Sitagliptin-Metformin HCl Tab 50-1000 MG: ORAL | 30 days supply | Qty: 60 | Fill #0 | Status: AC

## 2020-09-07 ENCOUNTER — Inpatient Hospital Stay: Payer: Medicare Other

## 2020-09-07 ENCOUNTER — Other Ambulatory Visit: Payer: Self-pay

## 2020-09-07 ENCOUNTER — Inpatient Hospital Stay: Payer: Medicare Other | Attending: Hematology | Admitting: Hematology

## 2020-09-07 ENCOUNTER — Encounter: Payer: Self-pay | Admitting: Hematology

## 2020-09-07 VITALS — BP 143/49 | HR 63 | Temp 98.4°F | Resp 16 | Ht 59.0 in | Wt 132.2 lb

## 2020-09-07 DIAGNOSIS — Z170 Estrogen receptor positive status [ER+]: Secondary | ICD-10-CM | POA: Diagnosis not present

## 2020-09-07 DIAGNOSIS — I10 Essential (primary) hypertension: Secondary | ICD-10-CM | POA: Diagnosis not present

## 2020-09-07 DIAGNOSIS — M8580 Other specified disorders of bone density and structure, unspecified site: Secondary | ICD-10-CM | POA: Insufficient documentation

## 2020-09-07 DIAGNOSIS — M546 Pain in thoracic spine: Secondary | ICD-10-CM | POA: Diagnosis not present

## 2020-09-07 DIAGNOSIS — G629 Polyneuropathy, unspecified: Secondary | ICD-10-CM | POA: Diagnosis not present

## 2020-09-07 DIAGNOSIS — C50212 Malignant neoplasm of upper-inner quadrant of left female breast: Secondary | ICD-10-CM | POA: Diagnosis not present

## 2020-09-07 DIAGNOSIS — Z9012 Acquired absence of left breast and nipple: Secondary | ICD-10-CM | POA: Diagnosis not present

## 2020-09-07 DIAGNOSIS — E119 Type 2 diabetes mellitus without complications: Secondary | ICD-10-CM | POA: Insufficient documentation

## 2020-09-07 DIAGNOSIS — Z923 Personal history of irradiation: Secondary | ICD-10-CM | POA: Insufficient documentation

## 2020-09-07 DIAGNOSIS — D649 Anemia, unspecified: Secondary | ICD-10-CM | POA: Insufficient documentation

## 2020-09-07 DIAGNOSIS — G8929 Other chronic pain: Secondary | ICD-10-CM | POA: Insufficient documentation

## 2020-09-07 DIAGNOSIS — Z853 Personal history of malignant neoplasm of breast: Secondary | ICD-10-CM | POA: Diagnosis not present

## 2020-09-07 LAB — CMP (CANCER CENTER ONLY)
ALT: 29 U/L (ref 0–44)
AST: 26 U/L (ref 15–41)
Albumin: 3.8 g/dL (ref 3.5–5.0)
Alkaline Phosphatase: 92 U/L (ref 38–126)
Anion gap: 12 (ref 5–15)
BUN: 9 mg/dL (ref 8–23)
CO2: 25 mmol/L (ref 22–32)
Calcium: 8.9 mg/dL (ref 8.9–10.3)
Chloride: 99 mmol/L (ref 98–111)
Creatinine: 0.77 mg/dL (ref 0.44–1.00)
GFR, Estimated: >60 mL/min (ref >60)
Glucose, Bld: 353 mg/dL — ABNORMAL HIGH (ref 70–99)
Potassium: 4.5 mmol/L (ref 3.5–5.1)
Sodium: 136 mmol/L (ref 135–145)
Total Bilirubin: 0.4 mg/dL (ref 0.3–1.2)
Total Protein: 7.8 g/dL (ref 6.5–8.1)

## 2020-09-07 LAB — CBC WITH DIFFERENTIAL (CANCER CENTER ONLY)
Abs Immature Granulocytes: 0.01 10*3/uL (ref 0.00–0.07)
Basophils Absolute: 0 10*3/uL (ref 0.0–0.1)
Basophils Relative: 1 %
Eosinophils Absolute: 1.2 10*3/uL — ABNORMAL HIGH (ref 0.0–0.5)
Eosinophils Relative: 21 %
HCT: 34.9 % — ABNORMAL LOW (ref 36.0–46.0)
Hemoglobin: 11.8 g/dL — ABNORMAL LOW (ref 12.0–15.0)
Immature Granulocytes: 0 %
Lymphocytes Relative: 35 %
Lymphs Abs: 2.1 10*3/uL (ref 0.7–4.0)
MCH: 32.2 pg (ref 26.0–34.0)
MCHC: 33.8 g/dL (ref 30.0–36.0)
MCV: 95.4 fL (ref 80.0–100.0)
Monocytes Absolute: 0.3 10*3/uL (ref 0.1–1.0)
Monocytes Relative: 5 %
Neutro Abs: 2.3 10*3/uL (ref 1.7–7.7)
Neutrophils Relative %: 38 %
Platelet Count: 142 10*3/uL — ABNORMAL LOW (ref 150–400)
RBC: 3.66 MIL/uL — ABNORMAL LOW (ref 3.87–5.11)
RDW: 14.9 % (ref 11.5–15.5)
WBC Count: 6 10*3/uL (ref 4.0–10.5)
nRBC: 0.8 % — ABNORMAL HIGH (ref 0.0–0.2)

## 2020-09-07 NOTE — Progress Notes (Signed)
Amanda Duarte   [phone] [phone]   Clinic Follow up Note   Patient Care Team: Amanda Pounds, Duarte as PCP - General (Nurse Practitioner) Amanda Duarte as Consulting Physician (Hematology) Amanda Skates, Duarte as Consulting Physician (General Surgery) Amanda Feeling, Duarte as Nurse Practitioner (Nurse Practitioner) Amanda Pray, Duarte as Consulting Physician (Radiation Oncology)  Date of Service:  09/07/2020  CHIEF COMPLAINT: f/u of left breast cancer  SUMMARY OF ONCOLOGIC HISTORY: Oncology History Overview Note  Cancer Staging Malignant neoplasm of upper-inner quadrant of left breast in female, estrogen receptor positive ([place]) Staging form: Breast, AJCC 8th Edition - Clinical stage from 05/01/2017: Stage Unknown (cTX, cN2, cM0, G3, ER-, PR-, HER2-) - Signed by Amanda Duarte on 11/01/2017 - Pathologic stage from 11/20/2017: No Stage Recommended (ypT0, pN76m, cM0, GX, ER-, PR-, HER2-) - Signed by Amanda Merle Duarte on 12/10/2017     Malignant neoplasm of upper-inner quadrant of left breast in female, estrogen receptor positive ([place]  04/28/2017 Mammogram   IMPRESSION: Two adjacent masses/enlarged lymph nodes in the LOWER LEFT axilla, the largest measuring 2.1 cm. Tissue sampling of 1 of these is recommended to exclude malignancy/lymphoma. No mammographic evidence of breast malignancy bilaterally.    05/01/2017 Initial Biopsy   Diagnosis 05/01/17 Lymph node, needle/core biopsy, low left inferior axillary - METASTATIC POORLY DIFFERENTIATED CARCINOMA TO A LYMPH NODE. SEE NOTE.   05/01/2017 Cancer Staging   Staging form: Breast, AJCC 8th Edition - Clinical stage from 05/01/2017: Stage Unknown (cTX, cN1, cM0, G3, ER-, PR-, HER2-) - Signed by Amanda Merle Duarte on 06/02/2017    05/01/2017 Receptors her2   Lymph Node Biopsy:  HER2-Negative  PR-Negative  ER- Negative    05/10/2017 Imaging   MR Breast Amanda WO Contrast 05/10/17 IMPRESSION: No MRI evidence of malignancy in  the right breast. Area of week stippled non mass enhancement in the left breast upper inner quadrant. Separate area of thin linear non mass enhancement in the subareolar left breast. Three grossly abnormal left axillary lymph nodes, and more than 4 less than 1 cm indeterminate left axillary lymph nodes. No evidence of right axillary lymphadenopathy.   05/17/2017 Initial Biopsy   Diagnosis 05/17/17 1. 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 2. Breast, left, needle core biopsy, upper inner post MR enhancement - MICROSCOPIC FOCUS OF DUCTAL CARCINOMA IN SITU.   05/17/2017 Receptors her2   Left Breast Biopsy:  ER-Negative PR-Negative HER2-Negative   05/22/2017 Initial Diagnosis   Malignant neoplasm of upper-inner quadrant of left breast in female, estrogen receptor positive ([place]   06/01/2017 Imaging   [place] Scan 06/01/17 IMPRESSION: No definite scintigraphic evidence of osseous metastatic disease.   Posttraumatic and postsurgical uptake at the LEFT knee.   Nonspecific soft tissue distribution of tracer at the LEFT thigh, could represent contusion, hemorrhage, soft tissue edema, or soft tissue calcifications such as from heterotopic calcification and myositis ossificans; recommend clinical correlation and consider dedicated LEFT femoral radiographs.   Single focus of nonspecific increased tracer localization at the lateral LEFT orbit.     06/01/2017 Imaging   06/01/2017 Bone Scan IMPRESSION: No definite scintigraphic evidence of osseous metastatic disease.   Posttraumatic and postsurgical uptake at the LEFT knee.   Nonspecific soft tissue distribution of tracer at the LEFT thigh, could represent contusion, hemorrhage, soft tissue edema, or soft tissue calcifications such as from heterotopic calcification and myositis ossificans; recommend clinical correlation  and consider dedicated LEFT femoral  radiographs.   Single focus of nonspecific increased tracer localization at the lateral LEFT orbit.   06/09/2017 -  Chemotherapy   ddAC every 2 weeks for 4 weeks starting 06/09/17-07/21/17 followed by weekly [place] and taxol for 12 weeks 08/04/17-10/20/17.    10/24/2017 Imaging   Breast MRI B/l 10/24/17 IMPRESSION: 1. No residual enhancement in the LEFT breast following neoadjuvant treatment. 2. Significantly smaller LEFT axillary lymph nodes, largest now measuring 1.4 centimeters. The remainder of LEFT axillary lymph nodes demonstrate normal fatty hila. RECOMMENDATION: Treatment plan for known LEFT breast cancer.   11/20/2017 Cancer Staging   Staging form: Breast, AJCC 8th Edition - Pathologic stage from 11/20/2017: No Stage Recommended (ypT0, pN40m, cM0, GX, ER-, PR-, HER2-) - Signed by Amanda Merle Duarte on 12/10/2017   11/20/2017 Surgery   Left mastectomy and SLN biopsy by Dr. IDalbert Duarte   11/20/2017 Pathology Results   Breast, modified radical mastectomy , left - MICROSCOPIC FOCI OF RESIDUAL METASTATIC CARCINOMA INVOLVING TWO OF EIGHT LYMPH NODES (2/8); LARGEST CONTINUOUS FOCUS MEASURES 0.1 CM. - NO EVIDENCE OF RESIDUAL CARCINOMA IN THE MASTECTOMY SPECIMEN - MARKED THERAPY-RELATED CHANGES, INCLUDING DENSE HYALIN FIBROSIS     11/20/2017 Receptors her2   ER- PR- HER2- (IHC 1+)   01/02/2018 - 02/14/2018 Radiation Therapy   Adjuvant Radiation 01/02/18 - 02/14/18   07/31/2018 Imaging   Baseline DEXA 07/31/18  ASSESSMENT: The BMD measured at [facility] L1-L4 is 0.973 g/cm2 with a T-score of -1.7.   This patient is considered OSTEOPENIC according to WFritz Duarte([facility] Duarte criteria. The scan quality is good.   Site Region Measured Date Measured Age YA T-score BMD Significant CHANGE   AP Spine  L1-L4      07/31/2018    63.1         -1.7    0.973 g/cm2   DualFemur Neck Left  07/31/2018    63.1         -1.5    0.828 g/cm2   DualFemur Total Mean 07/31/2018    63.1         0.1      1.016 g/cm2   11/05/2018 Survivorship   Per Amanda Duarte       CURRENT THERAPY:  Surveillance  INTERVAL HISTORY:  CKessler Duarte here for a follow up of breast cancer. She was last seen by Duarte Amanda Duarte on 03/09/20. She presents to the clinic alone. She reports continued pain to the surgery site, only occasionally. She also notes continued mid back pain.   All other systems were reviewed with the patient and are negative.  MEDICAL HISTORY:  Past Medical History:  Diagnosis Date   Cancer ([place] 03/2017   left breast cancer   Diabetes mellitus without complication ([place]    Hyperlipidemia    Hypertension    Malignant neoplasm of left breast ([place]    Stroke (cerebrum) ([place]    Tibial plateau fracture, left    04-13-17 had ORIF    SURGICAL HISTORY: Past Surgical History:  Procedure Laterality Date   MASTECTOMY Left 2019   MASTECTOMY MODIFIED RADICAL Left 11/20/2017   Procedure: LEFT MODIFIED RADICAL MASTECTOMY;  Surgeon: IFanny Skates Duarte;  [place]  Service: General;  Laterality: Left;   ORIF TIBIA PLATEAU Left 04/13/2017   Procedure: OPEN REDUCTION INTERNAL FIXATION (ORIF) TIBIAL PLATEAU;  Surgeon: HShona Needles Duarte;  [place]  Service: Orthopedics;  Laterality: Left;   PORT-A-CATH REMOVAL Right 11/20/2017  Procedure: REMOVAL PORT-A-CATH;  Surgeon: Amanda Skates, Duarte;  Location: [place];  Service: General;  Laterality: Right;   PORTACATH PLACEMENT Right 05/31/2017   Procedure: INSERTION PORT-A-CATH;  Surgeon: Amanda Skates, Duarte;  Location: [place];  Service: General;  Laterality: Right;    I have reviewed the social history and family history with the patient and they are unchanged from previous note.  ALLERGIES:  is allergic to victoza [liraglutide].  MEDICATIONS:  Current Outpatient Medications  Medication Sig Dispense Refill   ACCU-CHEK FASTCLIX LANCETS MISC Use as instructed. 100 each 12   amoxicillin (AMOXIL) 500 MG  capsule TAKE 1 CAPSULE EVERY 8 HOURS UNTIL ALL TAKEN. 21 capsule 0   atorvastatin (LIPITOR) 40 MG tablet TAKE 1 TABLET BY MOUTH ONCE A DAY 90 tablet 2   Blood Glucose Monitoring Suppl (ACCU-CHEK GUIDE ME) Amanda/Device KIT 1 kit by Does not apply route daily. 1 kit 0   Blood Pressure Monitor DEVI Please provide patient with insurance approved blood pressure monitor 1 each 0   citalopram (CELEXA) 20 MG tablet TAKE 1 TABLET BY MOUTH ONCE A DAY 90 tablet 3   dicyclomine (BENTYL) 20 MG tablet Take 1 tablet (20 mg total) by mouth 4 (four) times daily. 60 tablet 1   fenofibrate (TRICOR) 145 MG tablet TAKE 1 TABLET BY MOUTH ONCE A DAY 90 tablet 2   glimepiride (AMARYL) 4 MG tablet Take 1 tablet (4 mg total) by mouth in the morning and at bedtime. 180 tablet 1   glucose blood (ACCU-CHEK GUIDE) test strip Use as instructed 100 each 11   hydrochlorothiazide (HYDRODIURIL) 25 MG tablet TAKE 1 TABLET (25 MG TOTAL) BY MOUTH DAILY. 90 tablet 0   hydrocortisone 2.5 % cream APPLY TOPICALLY 2 (TWO) TIMES DAILY. ONCE DAILY FOR 2-4 WEEKS TO THE AFFECTED AREAS OUTSIDE THE VAGINA NOT INSIDE 30 g 0   insulin glargine (LANTUS) 100 UNIT/ML Solostar Pen INJECT 10 UNITS INTO THE SKIN DAILY. 9 mL 6   Insulin Pen Needle 31G X 5 MM MISC USE AS INSTRUCTED. INJECT INTO THE SKIN ONCE NIGHTLY. 100 each 6   losartan (COZAAR) 50 MG tablet TAKE 1 TABLET BY MOUTH ONCE A DAY 90 tablet 0   Na Sulfate-K Sulfate-Mg Sulf (SUPREP BOWEL PREP KIT) 17.5-3.13-1.6 GM/177ML SOLN Take 1 kit by mouth as directed. For colonoscopy prep 354 mL 0   potassium chloride SA (KLOR-CON) 20 MEQ tablet Take 1 tablet (20 mEq total) by mouth daily for 5 days. 5 tablet 0   sitaGLIPtin-metformin (JANUMET) 50-1000 MG tablet TAKE 1 TABLET BY MOUTH 2 TIMES DAILY 180 tablet 1   Current Facility-Administered Medications  Medication Dose Route Frequency Provider Last Rate Last Admin   0.9 %  sodium chloride infusion  500 mL Intravenous Once [place], Duarte         PHYSICAL EXAMINATION: ECOG PERFORMANCE STATUS: 0 - Asymptomatic  Vitals:   09/07/20 1348  BP: (!) 143/49  Pulse: 63  Resp: 16  Temp: 98.4 F (36.9 Amanda)  SpO2: 99%   Wt Readings from Last 3 Encounters:  09/07/20 132 lb 3.2 oz (60 kg)  06/30/20 125 lb 6.4 oz (56.9 kg)  03/09/20 124 lb 6.4 oz (56.4 kg)     GENERAL:alert, no distress and comfortable SKIN: skin color, texture, turgor are normal, no rashes or significant lesions EYES: normal, Conjunctiva are pink and non-injected, sclera clear  NECK: supple, thyroid normal size, non-tender, without nodularity LYMPH:  no palpable lymphadenopathy in the cervical, axillary  LUNGS: clear to auscultation and percussion with normal breathing effort HEART: regular rate & rhythm and no murmurs and no lower extremity edema ABDOMEN:abdomen soft, non-tender and normal bowel sounds Musculoskeletal:no cyanosis of digits and no clubbing  NEURO: alert & oriented x 3 with fluent speech, no focal motor/sensory deficits BREAST: left is s/p mastectomy; No palpable mass, nodules or adenopathy bilaterally. Breast exam benign.   LABORATORY DATA:  I have reviewed the data as listed CBC Latest Ref Rng & Units 09/07/2020 03/09/2020 10/08/2019  WBC 4.0 - 10.5 K/uL 6.0 7.4 8.2  Hemoglobin 12.0 - 15.0 g/dL 11.8(L) 14.8 14.0  Hematocrit 36.0 - 46.0 % 34.9(L) 43.2 41.6  Platelets 150 - 400 K/uL 142(L) 183 220     CMP Latest Ref Rng & Units 09/07/2020 06/30/2020 03/09/2020  Glucose 70 - 99 mg/dL 353(H) 63(L) 400(H)  BUN 8 - 23 mg/dL 9 14 13   Creatinine 0.44 - 1.00 mg/dL 0.77 0.80 0.80  Sodium 135 - 145 mmol/L 136 140 133(L)  Potassium 3.5 - 5.1 mmol/L 4.5 3.1(L) 4.0  Chloride 98 - 111 mmol/L 99 97 99  CO2 22 - 32 mmol/L 25 24 26   Calcium 8.9 - 10.3 mg/dL 8.9 9.8 9.4  Total Protein 6.5 - 8.1 g/dL 7.8 8.5 8.5(H)  Total Bilirubin 0.3 - 1.2 mg/dL 0.4 0.5 0.6  Alkaline Phos 38 - 126 U/L 92 61 133(H)  AST 15 - 41 U/L 26 29 20   ALT 0 - 44 U/L 29 18 20        RADIOGRAPHIC STUDIES: I have personally reviewed the radiological images as listed and agreed with the findings in the report. No results found.   ASSESSMENT & PLAN:  Amanda Duarte is a 65 y.o. female with   1. 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 She is s/p neoadjuvant ddAC-TC, left mastectomy and adjuvant radiation.  -Due to her excellent response to neoadjuvant chemotherapy, and minimal residual disease, I do not recommend more adjuvant chemotherapy such as Xeloda  -Given her triple negative disease and lymph node involvement, she is at risk for breast cancer recurrence. She is on breast cancer surveillance.  -Last seen by surgeon Dr. CBrantley Stagein 01/2020, f/u annually -Most recent right screening mammogram on 03/23/20 was negative. -She is clinically doing well. Labs reviewed, overall stable with some mild anemia. Her physical exam was unremarkable. -she has residual left chest wall shooting pain, secondary to mastectomy. She denies needing any medication at this time. -Continue Surveillance. Next Mammogram in 03/2021 -F/u in 6 months   2. Mild anemia -Hgb noted to be 11.8 today (09/07/20) -last colonoscopy 08/21/19 with Dr. BTarri Glennwas negative (aside from possible prior infection), recall in 10 years -I recommended she take a multivitamin and increase her protein intake. She notes she does not eat meat any more. -I advised her to call if she develops any symptoms, such as increasing fatigue or bleeding.  3. Chronic thoracic spine pain -MRI 11/14/18 showed normal cord, no osseous metastasis  -I advised her to take OTC medications as needed   4. HTN and DM  -Continue follow-up with primary care physician  -on Lipitor, Losartan and Janumet, glimepiride -Her HTN is controlled and her DM uncontrolled. BG up to 353 today  (09/07/20) -She will continue to monitor with her PCP.    5. Osteopenia  -Baseline 07/2018 DEXA show Lowest T-score at -1.7  at [facility]. No significant risk for Fx   6. 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  -did not mention neuropathy symptoms today (09/07/20)      PLAN:   -call with any increased anemia symptoms (fatigue, bleeding) -Lab and f/u in 6 months     No problem-specific Assessment & Plan notes found for this encounter.   No orders of the defined types were placed in this encounter.  All questions were answered. The patient knows to call the clinic with any problems, questions or concerns. No barriers to learning was detected. The total time spent in the appointment was 30 minutes.     Amanda Duarte 09/07/2020   I, Wilburn Mylar, am acting as scribe for Amanda Duarte.   I have reviewed the above documentation for accuracy and completeness, and I agree with the above.

## 2020-09-19 NOTE — Telephone Encounter (Signed)
This encounter was created in error - please disregard.

## 2020-10-02 ENCOUNTER — Other Ambulatory Visit: Payer: Self-pay

## 2020-10-02 ENCOUNTER — Encounter: Payer: Self-pay | Admitting: Nurse Practitioner

## 2020-10-02 ENCOUNTER — Ambulatory Visit: Payer: Medicare Other | Attending: Nurse Practitioner | Admitting: Nurse Practitioner

## 2020-10-02 DIAGNOSIS — Z794 Long term (current) use of insulin: Secondary | ICD-10-CM | POA: Diagnosis not present

## 2020-10-02 DIAGNOSIS — I10 Essential (primary) hypertension: Secondary | ICD-10-CM

## 2020-10-02 DIAGNOSIS — E1165 Type 2 diabetes mellitus with hyperglycemia: Secondary | ICD-10-CM

## 2020-10-02 NOTE — Progress Notes (Signed)
Virtual Visit via Telephone Note Due to national recommendations of social distancing due to [place] 19, telehealth visit is felt to be most appropriate for this patient at this time.  I discussed the limitations, risks, security and privacy concerns of performing an evaluation and management service by telephone and the availability of in person appointments. I also discussed with the patient that there may be a patient responsible charge related to this service. The patient expressed understanding and agreed to proceed.    I connected with Amanda Duarte on 10/02/20  at  10:30 AM EDT  EDT by telephone and verified that I am speaking with the correct person using two identifiers.  Location of Patient: Private Residence   Location of Provider: [place] participating in Telemedicine visit: [name] FNP-BC [facility]  Spanish Interpreter [id number] Katharine Look   History of Present Illness: Telemedicine visit for: DM She has a past medical history of Cancer ([place]) (03/2017), Diabetes mellitus without complication, Hyperlipidemia, Hypertension, Malignant neoplasm of left breast, Stroke, and Tibial plateau fracture, left.    DM 2 Not well controlled. Average glucose ranges at home 140-150 postprandial. Taking glimepiride 4 mg BID, Lantus 10 units daily, janumet 50-1000 mg BID.  LDL at goal with atorvastatin 40 mg daily and fenofibrate 145 mg daily.  Lab Results  Component Value Date   HGBA1C 8.8 (A) 06/30/2020    Lab Results  Component Value Date   LDLCALC 54 06/30/2020     HTN Blood pressure is not well controlled. Taking losartan 50 mg daily, HCTZ 25 mg daily. Denies chest pain, shortness of breath, palpitations, lightheadedness, dizziness, headaches or BLE edema.   BP Readings from Last 3 Encounters:  09/07/20 (!) 143/49  06/30/20 133/64  03/09/20 130/65     Past Medical History:  Diagnosis Date   Cancer ([place]) 03/2017    left breast cancer   Diabetes mellitus without complication ([place])    Hyperlipidemia    Hypertension    Malignant neoplasm of left breast ([place])    Stroke (cerebrum) ([place])    Tibial plateau fracture, left    04-13-17 had ORIF    Past Surgical History:  Procedure Laterality Date   MASTECTOMY Left 2019   MASTECTOMY MODIFIED RADICAL Left 11/20/2017   Procedure: LEFT MODIFIED RADICAL MASTECTOMY;  Surgeon: Fanny Skates, MD;  Location: [place];  Service: General;  Laterality: Left;   ORIF TIBIA PLATEAU Left 04/13/2017   Procedure: OPEN REDUCTION INTERNAL FIXATION (ORIF) TIBIAL PLATEAU;  Surgeon: Shona Needles, MD;  Location: [place];  Service: Orthopedics;  Laterality: Left;   PORT-A-CATH REMOVAL Right 11/20/2017   Procedure: REMOVAL PORT-A-CATH;  Surgeon: Fanny Skates, MD;  Location: [place];  Service: General;  Laterality: Right;   PORTACATH PLACEMENT Right 05/31/2017   Procedure: INSERTION PORT-A-CATH;  Surgeon: Fanny Skates, MD;  Location: [place];  Service: General;  Laterality: Right;    Family History  Problem Relation Age of Onset   Heart Problems Mother    Diabetes Son    Breast cancer Neg Hx    Colon cancer Neg Hx    Rectal cancer Neg Hx    Stomach cancer Neg Hx    Esophageal cancer Neg Hx     Social History   Socioeconomic History   Marital status: Married    Spouse name: Not on file   Number of children: Not on file   Years of education: Not on file   Highest education  level: Not on file  Occupational History   Not on file  Tobacco Use   Smoking status: Never   Smokeless tobacco: Never  Vaping Use   Vaping Use: Never used  Substance and Sexual Activity   Alcohol use: No   Drug use: No   Sexual activity: Not Currently    Birth control/protection: None  Other Topics Concern   Not on file  Social History Narrative   Not on file   Social Determinants of Health   Financial Resource Strain: Not on file  Food Insecurity: Not on file   Transportation Needs: Not on file  Physical Activity: Not on file  Stress: Not on file  Social Connections: Not on file     Observations/Objective: Awake, alert and oriented x 3   ROS  Assessment and Plan: Diagnoses and all orders for this visit:  Type 2 diabetes mellitus with hyperglycemia, with long-term current use of insulin ([place]) Continue blood sugar control as discussed in office today, low carbohydrate diet, and regular physical exercise as tolerated, 150 minutes per week (30 min each day, 5 days per week, or 50 min 3 days per week). Keep blood sugar logs with fasting goal of 90-130 mg/dl, post prandial (after you eat) less than 180.  For Hypoglycemia: BS <60 and Hyperglycemia BS >400; contact the clinic ASAP. Annual eye exams and foot exams are recommended.   Essential hypertension Continue all antihypertensives as prescribed.  Remember to bring in your blood pressure log with you for your follow up appointment.  DASH/Mediterranean Diets are healthier choices for HTN.      Follow Up Instructions Return in about 5 weeks (around 11/06/2020) for DM /HTN.     I discussed the assessment and treatment plan with the patient. The patient was provided an opportunity to ask questions and all were answered. The patient agreed with the plan and demonstrated an understanding of the instructions.   The patient was advised to call back or seek an in-person evaluation if the symptoms worsen or if the condition fails to improve as anticipated.  I provided 10 minutes of non-face-to-face time during this encounter including median intraservice time, reviewing previous notes, labs, imaging, medications and explaining diagnosis and management.  [name], FNP-BC

## 2020-11-13 ENCOUNTER — Encounter: Payer: Self-pay | Admitting: Nurse Practitioner

## 2020-11-13 ENCOUNTER — Other Ambulatory Visit: Payer: Self-pay

## 2020-11-13 ENCOUNTER — Ambulatory Visit: Payer: Medicare Other | Attending: Nurse Practitioner | Admitting: Nurse Practitioner

## 2020-11-13 ENCOUNTER — Encounter: Payer: Self-pay | Admitting: Hematology

## 2020-11-13 VITALS — BP 131/77 | HR 90 | Ht 59.0 in | Wt 128.2 lb

## 2020-11-13 DIAGNOSIS — Z79899 Other long term (current) drug therapy: Secondary | ICD-10-CM | POA: Insufficient documentation

## 2020-11-13 DIAGNOSIS — E781 Pure hyperglyceridemia: Secondary | ICD-10-CM | POA: Diagnosis not present

## 2020-11-13 DIAGNOSIS — E785 Hyperlipidemia, unspecified: Secondary | ICD-10-CM | POA: Insufficient documentation

## 2020-11-13 DIAGNOSIS — F321 Major depressive disorder, single episode, moderate: Secondary | ICD-10-CM | POA: Insufficient documentation

## 2020-11-13 DIAGNOSIS — E1165 Type 2 diabetes mellitus with hyperglycemia: Secondary | ICD-10-CM | POA: Diagnosis not present

## 2020-11-13 DIAGNOSIS — Z794 Long term (current) use of insulin: Secondary | ICD-10-CM | POA: Insufficient documentation

## 2020-11-13 DIAGNOSIS — Z833 Family history of diabetes mellitus: Secondary | ICD-10-CM | POA: Diagnosis not present

## 2020-11-13 DIAGNOSIS — Z853 Personal history of malignant neoplasm of breast: Secondary | ICD-10-CM | POA: Diagnosis not present

## 2020-11-13 DIAGNOSIS — Z888 Allergy status to other drugs, medicaments and biological substances status: Secondary | ICD-10-CM | POA: Diagnosis not present

## 2020-11-13 DIAGNOSIS — E118 Type 2 diabetes mellitus with unspecified complications: Secondary | ICD-10-CM | POA: Diagnosis not present

## 2020-11-13 DIAGNOSIS — I10 Essential (primary) hypertension: Secondary | ICD-10-CM | POA: Diagnosis not present

## 2020-11-13 DIAGNOSIS — Z8673 Personal history of transient ischemic attack (TIA), and cerebral infarction without residual deficits: Secondary | ICD-10-CM | POA: Insufficient documentation

## 2020-11-13 LAB — GLUCOSE, POCT (MANUAL RESULT ENTRY): POC Glucose: 345 mg/dl — AB (ref 70–99)

## 2020-11-13 LAB — POCT GLYCOSYLATED HEMOGLOBIN (HGB A1C): Hemoglobin A1C: 10.8 % — AB (ref 4.0–5.6)

## 2020-11-13 MED ORDER — HYDROCHLOROTHIAZIDE 25 MG PO TABS
ORAL_TABLET | Freq: Every day | ORAL | 0 refills | Status: DC
  Filled 2020-11-13: qty 90, 90d supply, fill #0

## 2020-11-13 MED ORDER — ACCU-CHEK GUIDE ME W/DEVICE KIT
1.0000 | PACK | Freq: Every day | 0 refills | Status: DC
  Filled 2020-11-13: qty 1, fill #0

## 2020-11-13 MED ORDER — INSULIN GLARGINE 100 UNIT/ML SOLOSTAR PEN
15.0000 [IU] | PEN_INJECTOR | Freq: Every day | SUBCUTANEOUS | 3 refills | Status: DC
  Filled 2020-11-13: qty 12, 80d supply, fill #0

## 2020-11-13 MED ORDER — CITALOPRAM HYDROBROMIDE 20 MG PO TABS
ORAL_TABLET | Freq: Every day | ORAL | 3 refills | Status: DC
Start: 2020-11-13 — End: 2021-02-15
  Filled 2020-11-13: qty 90, 90d supply, fill #0

## 2020-11-13 MED ORDER — LOSARTAN POTASSIUM 50 MG PO TABS
ORAL_TABLET | Freq: Every day | ORAL | 0 refills | Status: DC
  Filled 2020-11-13: qty 90, 90d supply, fill #0

## 2020-11-13 MED ORDER — GLIMEPIRIDE 4 MG PO TABS
4.0000 mg | ORAL_TABLET | Freq: Two times a day (BID) | ORAL | 1 refills | Status: DC
  Filled 2020-11-13: qty 180, 90d supply, fill #0

## 2020-11-13 MED ORDER — ATORVASTATIN CALCIUM 40 MG PO TABS
ORAL_TABLET | Freq: Every day | ORAL | 2 refills | Status: DC
  Filled 2020-11-13: qty 90, 90d supply, fill #0

## 2020-11-13 MED ORDER — ACCU-CHEK GUIDE VI STRP
ORAL_STRIP | 11 refills | Status: DC
  Filled 2020-11-13: qty 100, 25d supply, fill #0

## 2020-11-13 MED ORDER — ACCU-CHEK FASTCLIX LANCETS MISC
12 refills | Status: DC
  Filled 2020-11-13: qty 100, fill #0
  Filled 2020-12-11: qty 100, 30d supply, fill #0

## 2020-11-13 MED ORDER — FENOFIBRATE 145 MG PO TABS
ORAL_TABLET | Freq: Every day | ORAL | 2 refills | Status: DC
  Filled 2020-11-13 (×2): qty 90, 90d supply, fill #0

## 2020-11-13 NOTE — Progress Notes (Signed)
Assessment & Plan:  Amanda Duarte was seen today for diabetes.  Diagnoses and all orders for this visit:  Type 2 diabetes mellitus with complication, without long-term current use of insulin (HCC) -     POCT glucose (manual entry) -     POCT glycosylated hemoglobin (Hb A1C) -     insulin glargine (LANTUS) 100 UNIT/ML Solostar Pen; Inject 15 Units into the skin daily. -     glucose blood (ACCU-CHEK GUIDE) test strip; Use as instructed -     Blood Glucose Monitoring Suppl (ACCU-CHEK GUIDE ME) w/Device KIT; 1 kit by Does not apply route daily. -     Accu-Chek FastClix Lancets MISC; Use as instructed. -     glimepiride (AMARYL) 4 MG tablet; Take 1 tablet (4 mg total) by mouth in the morning and at bedtime. Continue blood sugar control as discussed in office today, low carbohydrate diet, and regular physical exercise as tolerated, 150 minutes per week (30 min each day, 5 days per week, or 50 min 3 days per week). Keep blood sugar logs with fasting goal of 90-130 mg/dl, post prandial (after you eat) less than 180.  For Hypoglycemia: BS <60 and Hyperglycemia BS >400; contact the clinic ASAP. Annual eye exams and foot exams are recommended.   Pure hypertriglyceridemia -     atorvastatin (LIPITOR) 40 MG tablet; TAKE 1 TABLET BY MOUTH ONCE A DAY -     fenofibrate (TRICOR) 145 MG tablet; TAKE 1 TABLET BY MOUTH ONCE A DAY INSTRUCTIONS: Work on a low fat, heart healthy diet and participate in regular aerobic exercise program by working out at least 150 minutes per week; 5 days a week-30 minutes per day. Avoid red meat/beef/steak,  fried foods. junk foods, sodas, sugary drinks, unhealthy snacking, alcohol and smoking.  Drink at least 80 oz of water per day and monitor your carbohydrate intake daily.    Essential hypertension -     hydrochlorothiazide (HYDRODIURIL) 25 MG tablet; TAKE 1 TABLET (25 MG TOTAL) BY MOUTH DAILY. -     losartan (COZAAR) 50 MG tablet; TAKE 1 TABLET BY MOUTH ONCE A DAY Continue all  antihypertensives as prescribed.  Remember to bring in your blood pressure log with you for your follow up appointment.  DASH/Mediterranean Diets are healthier choices for HTN.    Current moderate episode of major depressive disorder without prior episode ([place]) -     citalopram (CELEXA) 20 MG tablet; TAKE 1 TABLET BY MOUTH ONCE A DAY   Patient has been counseled on age-appropriate routine health concerns for screening and prevention. These are reviewed and up-to-date. Referrals have been placed accordingly. Immunizations are up-to-date or declined.    Subjective:   Chief Complaint  Patient presents with   Diabetes   HPI Amanda Duarte 65 y.o. female presents to office today for follow up. VRI was used to communicate directly with patient for the entire encounter including providing detailed patient instructions.    She is also accompanied by her niece who is assisting with health history today  She has a past medical history of Cancer ([facility]) (03/2017), Diabetes mellitus without complication ([place]), Hyperlipidemia, Hypertension, Malignant neoplasm of left breast ([place]), Stroke (cerebrum) ([place]), and Tibial plateau fracture, left.    DM 2 Poorly controlled. She is skipping meals. Does not administer lantus every day as prescribed. Endorses adherence taking Janumet 50-1000 mg BID and glimepiride 4 mg BID. Notes blood glucose readings 130s fasting and postprandial however her meter is over 57 years old  and average readings of 130s would not correlate with today's A1c of 10.8. Will order new meter today. Glucose here in office today is >300. She is asymptomatic.  Will increase lantus from  10 units daily to 15 units daily. She will see pharmacist in a few weeks. May also benefit from taking SGLT instead of janumet. LDL at goal. Could not tolerate GLP 1 (victoza) She stopped taking her cholesterol medication and states the reason was she was told her cholesterol levels were good. I have  instructed her to take her cholesterol medication as prescribed (atorvastatin 40 mg daily and tricor 145 mg daily).  Lab Results  Component Value Date   HGBA1C 10.8 (A) 11/13/2020    Lab Results  Component Value Date   HGBA1C 8.8 (A) 06/30/2020   Lab Results  Component Value Date   LDLCALC 54 06/30/2020     HTN Blood pressure is well controlled with taking HCTZ 25 mg daily and losartan 50 mg daily.  BP Readings from Last 3 Encounters:  11/13/20 131/77  09/07/20 (!) 143/49  06/30/20 133/64     Lab Results  Component Value Date   LDLCALC 54 06/30/2020    The ASCVD Risk score (Arnett DK, et al., 2019) failed to calculate for the following reasons:   The valid total cholesterol range is 130 to 320 mg/dL  Review of Systems  Constitutional:  Negative for fever, malaise/fatigue and weight loss.  HENT: Negative.  Negative for nosebleeds.   Eyes: Negative.  Negative for blurred vision, double vision and photophobia.  Respiratory: Negative.  Negative for cough and shortness of breath.   Cardiovascular: Negative.  Negative for chest pain, palpitations and leg swelling.  Gastrointestinal: Negative.  Negative for heartburn, nausea and vomiting.  Musculoskeletal: Negative.  Negative for myalgias.  Neurological: Negative.  Negative for dizziness, focal weakness, seizures and headaches.  Psychiatric/Behavioral: Negative.  Negative for suicidal ideas.    Past Medical History:  Diagnosis Date   Cancer ([facility]) 03/2017   left breast cancer   Diabetes mellitus without complication ([place])    Hyperlipidemia    Hypertension    Malignant neoplasm of left breast ([place])    Stroke (cerebrum) ([place])    Tibial plateau fracture, left    04-13-17 had ORIF    Past Surgical History:  Procedure Laterality Date   MASTECTOMY Left 2019   MASTECTOMY MODIFIED RADICAL Left 11/20/2017   Procedure: LEFT MODIFIED RADICAL MASTECTOMY;  Surgeon: Fanny Skates, MD;  Location: [facility];  Service: General;   Laterality: Left;   ORIF TIBIA PLATEAU Left 04/13/2017   Procedure: OPEN REDUCTION INTERNAL FIXATION (ORIF) TIBIAL PLATEAU;  Surgeon: Shona Needles, MD;  Location: [facility];  Service: Orthopedics;  Laterality: Left;   PORT-A-CATH REMOVAL Right 11/20/2017   Procedure: REMOVAL PORT-A-CATH;  Surgeon: Fanny Skates, MD;  Location: [facility];  Service: General;  Laterality: Right;   PORTACATH PLACEMENT Right 05/31/2017   Procedure: INSERTION PORT-A-CATH;  Surgeon: Fanny Skates, MD;  Location: [facility];  Service: General;  Laterality: Right;    Family History  Problem Relation Age of Onset   Heart Problems Mother    Diabetes Son    Breast cancer Neg Hx    Colon cancer Neg Hx    Rectal cancer Neg Hx    Stomach cancer Neg Hx    Esophageal cancer Neg Hx     Social History Reviewed with no changes to be made today.   Outpatient Medications Prior to Visit  Medication  Sig Dispense Refill   Blood Pressure Monitor DEVI Please provide patient with insurance approved blood pressure monitor 1 each 0   sitaGLIPtin-metformin (JANUMET) 50-1000 MG tablet TAKE 1 TABLET BY MOUTH 2 TIMES DAILY 180 tablet 1   ACCU-CHEK FASTCLIX LANCETS MISC Use as instructed. 100 each 12   Blood Glucose Monitoring Suppl (ACCU-CHEK GUIDE ME) w/Device KIT 1 kit by Does not apply route daily. 1 kit 0   glucose blood (ACCU-CHEK GUIDE) test strip Use as instructed 100 each 11   hydrocortisone 2.5 % cream APPLY TOPICALLY 2 (TWO) TIMES DAILY. ONCE DAILY FOR 2-4 WEEKS TO THE AFFECTED AREAS OUTSIDE THE VAGINA NOT INSIDE (Patient not taking: Reported on 11/13/2020) 30 g 0   Insulin Pen Needle 31G X 5 MM MISC USE AS INSTRUCTED. INJECT INTO THE SKIN ONCE NIGHTLY. (Patient not taking: Reported on 11/13/2020) 100 each 6   Na Sulfate-K Sulfate-Mg Sulf (SUPREP BOWEL PREP KIT) 17.5-3.13-1.6 GM/177ML SOLN Take 1 kit by mouth as directed. For colonoscopy prep (Patient not taking: Reported on 11/13/2020) 354 mL 0   potassium  chloride SA (KLOR-CON) 20 MEQ tablet Take 1 tablet (20 mEq total) by mouth daily for 5 days. 5 tablet 0   atorvastatin (LIPITOR) 40 MG tablet TAKE 1 TABLET BY MOUTH ONCE A DAY (Patient not taking: Reported on 11/13/2020) 90 tablet 2   citalopram (CELEXA) 20 MG tablet TAKE 1 TABLET BY MOUTH ONCE A DAY 90 tablet 3   dicyclomine (BENTYL) 20 MG tablet Take 1 tablet (20 mg total) by mouth 4 (four) times daily. (Patient not taking: Reported on 11/13/2020) 60 tablet 1   fenofibrate (TRICOR) 145 MG tablet TAKE 1 TABLET BY MOUTH ONCE A DAY (Patient not taking: Reported on 11/13/2020) 90 tablet 2   glimepiride (AMARYL) 4 MG tablet Take 1 tablet (4 mg total) by mouth in the morning and at bedtime. 180 tablet 1   hydrochlorothiazide (HYDRODIURIL) 25 MG tablet TAKE 1 TABLET (25 MG TOTAL) BY MOUTH DAILY. (Patient not taking: Reported on 11/13/2020) 90 tablet 0   insulin glargine (LANTUS) 100 UNIT/ML Solostar Pen INJECT 10 UNITS INTO THE SKIN DAILY. (Patient not taking: Reported on 11/13/2020) 9 mL 6   losartan (COZAAR) 50 MG tablet TAKE 1 TABLET BY MOUTH ONCE A DAY (Patient not taking: Reported on 11/13/2020) 90 tablet 0   Facility-Administered Medications Prior to Visit  Medication Dose Route Frequency Provider Last Rate Last Admin   0.9 %  sodium chloride infusion  500 mL Intravenous Once [place], MD        Allergies  Allergen Reactions   Victoza [Liraglutide] Nausea And Vomiting       Objective:    BP 131/77   Pulse 90   Ht 4' 11"  (1.499 m)   Wt 128 lb 4 oz (58.2 kg)   SpO2 95%   BMI 25.90 kg/m  Wt Readings from Last 3 Encounters:  11/13/20 128 lb 4 oz (58.2 kg)  09/07/20 132 lb 3.2 oz (60 kg)  06/30/20 125 lb 6.4 oz (56.9 kg)    Physical Exam Vitals and nursing note reviewed.  Constitutional:      Appearance: She is well-developed.  HENT:     Head: Normocephalic and atraumatic.  Cardiovascular:     Rate and Rhythm: Normal rate and regular rhythm.     Heart sounds: Normal  heart sounds. No murmur heard.   No friction rub. No gallop.  Pulmonary:     Effort: Pulmonary effort is normal. No tachypnea  or respiratory distress.     Breath sounds: Normal breath sounds. No decreased breath sounds, wheezing, rhonchi or rales.  Chest:     Chest wall: No tenderness.  Abdominal:     General: Bowel sounds are normal.     Palpations: Abdomen is soft.  Musculoskeletal:        General: Normal range of motion.     Cervical back: Normal range of motion.  Skin:    General: Skin is warm and dry.  Neurological:     Mental Status: She is alert and oriented to person, place, and time.     Coordination: Coordination normal.  Psychiatric:        Behavior: Behavior normal. Behavior is cooperative.        Thought Content: Thought content normal.        Judgment: Judgment normal.         Patient has been counseled extensively about nutrition and exercise as well as the importance of adherence with medications and regular follow-up. The patient was given clear instructions to go to ER or return to medical center if symptoms don't improve, worsen or new problems develop. The patient verbalized understanding.   Follow-up: Return for 4 weeks luke meter check. see me in 3 months.   [name], FNP-BC [facility] and [place], [place]   11/13/2020, 12:02 PM

## 2020-11-16 ENCOUNTER — Other Ambulatory Visit: Payer: Self-pay

## 2020-11-19 ENCOUNTER — Other Ambulatory Visit: Payer: Self-pay

## 2020-12-11 ENCOUNTER — Ambulatory Visit: Payer: Medicare Other | Attending: Nurse Practitioner | Admitting: Pharmacist

## 2020-12-11 ENCOUNTER — Encounter: Payer: Self-pay | Admitting: Hematology

## 2020-12-11 ENCOUNTER — Other Ambulatory Visit: Payer: Self-pay

## 2020-12-11 DIAGNOSIS — E118 Type 2 diabetes mellitus with unspecified complications: Secondary | ICD-10-CM | POA: Diagnosis not present

## 2020-12-11 DIAGNOSIS — Z794 Long term (current) use of insulin: Secondary | ICD-10-CM

## 2020-12-11 DIAGNOSIS — E1165 Type 2 diabetes mellitus with hyperglycemia: Secondary | ICD-10-CM

## 2020-12-11 MED ORDER — ACCU-CHEK SOFTCLIX LANCETS MISC: 3 refills | Status: DC

## 2020-12-11 MED ORDER — SITAGLIPTIN PHOS-METFORMIN HCL 50-1000 MG PO TABS
1.0000 | ORAL_TABLET | Freq: Two times a day (BID) | ORAL | 1 refills | Status: DC
  Filled 2020-12-11: qty 180, 90d supply, fill #0

## 2020-12-11 MED ORDER — ACCU-CHEK GUIDE ME W/DEVICE KIT: PACK | 0 refills | Status: DC

## 2020-12-11 MED ORDER — ACCU-CHEK GUIDE VI STRP: ORAL_STRIP | 11 refills | Status: DC

## 2020-12-11 MED FILL — Sitagliptin-Metformin HCl Tab 50-1000 MG: ORAL | 30 days supply | Qty: 60 | Fill #1 | Status: CN

## 2020-12-11 NOTE — Patient Instructions (Signed)
Spanish:  Gracias por venir a verme hoy. Por favor haz lo siguiente:  1. Tome Lantus 15 [employer]. 2. Tome una tableta de Janumet por la maana y Ardelia Mems tableta por la noche. 3. Tome una tableta de glimepirida por la maana y 1 tableta a la hora de [place]. 4. Envi su medidor a [facility] en [place] haciendo los cambios de estilo de vida que hemos discutido juntos durante nuestra visita. La dieta y el ejercicio juegan un papel importante en la mejora de los niveles de azcar en la [place]. 6. Seguimiento conmigo en 2-3 semanas.    English:  Thank you for coming to see me today. Please do the following:  Take Lantus 15 units every day.  Take Janumet one tablet in the morning and 1 tablet in the evening.  Take glimepiride one tablet in the morning and 1 tablet at bedtime.  I sent your meter to the [facility] on [address]  Continue making the lifestyle changes we've discussed together during our visit. Diet and exercise play a significant role in improving your blood sugars.  Follow-up with me in 2-3 weeks.

## 2020-12-11 NOTE — Progress Notes (Signed)
    S:    PCP: Zelda   No chief complaint on file.  Patient arrives in good spirits.  Presents for diabetes evaluation, education, and management. Patient was referred and last seen by her PCP on 11/13/2020.    Family/Social History:  - FHx: DM (son) - Tobacco: never smoker  - Alcohol: denies use   Insurance coverage/medication affordability: [place] Medicare, [place] Medicaid   Patient denies adherence with medications.  Current diabetes medications include: glimepiride 4 mg BID (taking once daily), Janumet 50-1000 mg BID (has not picked up since 08/2020 for a 30-day supply), Lantus 15 units daily (reports taking 4 units daily)  Patient denies hypoglycemic events.  Patient reported dietary habits:  - admits to dietary indiscretion but does not elaborate   Patient-reported exercise habits:  - none. Stopped walking since the weather changed    Patient denies nocturia. Patient reports neuropathy but chemotherapy is contributing to this.  Patient denies visual changes. Patient reports self foot exams.    O:  POCT glucose: 301  Lab Results  Component Value Date   HGBA1C 10.8 (A) 11/13/2020   There were no vitals filed for this visit.  Lipid Panel     Component Value Date/Time   CHOL 127 06/30/2020 1636   TRIG 150 (H) 06/30/2020 1636   HDL 47 06/30/2020 1636   CHOLHDL 2.7 06/30/2020 1636   CHOLHDL 4.6 05/19/2014 0625   VLDL 54 (H) 05/19/2014 0625   LDLCALC 54 06/30/2020 1636   Home blood sugar range: not checking. Tells me her insurance does not cover her meter.   Clinical Atherosclerotic Cardiovascular Disease (ASCVD): No  The ASCVD Risk score (Arnett DK, et al., 2019) failed to calculate for the following reasons:   The valid total cholesterol range is 130 to 320 mg/dL   A/P: Diabetes longstanding currently uncontrolled. Patient is able to verbalize appropriate hypoglycemia management plan. Patient is not adherent with medication.  -Continued current regimen. Strongly  emphasized compliance.  -Extensively discussed pathophysiology of diabetes, recommended lifestyle interventions, dietary effects on blood sugar control -Counseled on s/sx of and management of hypoglycemia -Next A1C anticipated 01/2021.   Written patient instructions provided.  Total time in face to face counseling 20 minutes.   Follow up PCP Clinic Visit in 3 weeks.   Benard Halsted, PharmD, [place], [place] [phone]

## 2020-12-22 ENCOUNTER — Other Ambulatory Visit: Payer: Self-pay

## 2021-01-01 ENCOUNTER — Ambulatory Visit: Payer: Medicare Other | Admitting: Pharmacist

## 2021-01-05 ENCOUNTER — Other Ambulatory Visit: Payer: Self-pay

## 2021-01-05 ENCOUNTER — Ambulatory Visit: Payer: Medicare Other | Admitting: Physician Assistant

## 2021-01-05 VITALS — BP 135/60 | HR 72 | Temp 98.2°F | Resp 18 | Ht 62.0 in | Wt 125.0 lb

## 2021-01-05 DIAGNOSIS — E118 Type 2 diabetes mellitus with unspecified complications: Secondary | ICD-10-CM

## 2021-01-05 DIAGNOSIS — L309 Dermatitis, unspecified: Secondary | ICD-10-CM

## 2021-01-05 MED ORDER — HYDROXYZINE HCL 10 MG PO TABS: 10.0000 mg | ORAL_TABLET | Freq: Three times a day (TID) | ORAL | 0 refills | Status: DC | PRN

## 2021-01-05 MED ORDER — TRIAMCINOLONE ACETONIDE 0.1 % EX CREA: 1.0000 "application " | TOPICAL_CREAM | Freq: Two times a day (BID) | CUTANEOUS | 1 refills | Status: DC

## 2021-01-05 NOTE — Patient Instructions (Signed)
You are going to use a steroid cream twice a day until your rash resolves.  You can also use the hydroxyzine every 8 hours as needed to help with the itching.  Kennieth Rad, PA-C Physician Assistant [facility] [email or web address]   Dermatitis atpica Atopic Dermatitis La dermatitis atpica es un trastorno de la piel que causa inflamacin. Se caracteriza por una erupcin roja y piel seca y escamosa con comezn. Es el tipo ms frecuente de eccema. El eccema es un grupo de afecciones de la piel que hacen que esta se ponga spera e hinchada. Esta afeccin, generalmente, empeora durante los meses fros del invierno y suele mejorar durante los meses clidos del verano. La dermatitis atpica, normalmente, comienza a manifestarse en la infancia y puede durar hasta la [place]. Esta afeccin no se transmite de [place] persona a otra (no es contagiosa). La dermatitis atpica puede no estar siempre presente, pero cuando lo est, se denomina brote. Cules son las causas? Se desconoce la causa exacta de esta afeccin. Los brotes pueden desencadenarse por: Dietitian en contacto con alguna cosa a la que es sensible o alrgico (alrgeno). Estrs. Ciertos alimentos. Clima extremadamente clido o fro. Jabones y sustancias qumicas fuertes. Aire seco. Cloro. Qu incrementa el riesgo? Es ms probable que esta afeccin se desarrolle en personas con antecedentes familiares de: Eccema. Alergias. Asma. Fiebre del heno. Cules son los signos o los sntomas? Los sntomas de esta afeccin incluyen: Piel seca y escamosa. Erupcin roja y que pica. Picazn, que puede ser muy intensa. Puede ocurrir antes de la erupcin en la piel. Esto puede dificultar el sueo. Engrosamiento y Paramedic de la piel que pueden producirse con Physiological scientist. Cmo se diagnostica? Esta afeccin se diagnostica en funcin de lo siguiente: Sus sntomas. Sus antecedentes  mdicos. Un examen fsico. Cmo se trata? No hay cura para esta afeccin, pero los sntomas, normalmente, se pueden controlar. El tratamiento se centra en lo siguiente: Controlar la picazn y el rascado. Probablemente, le receten medicamentos, como antihistamnicos o cremas corticoesteroides. Limitar la exposicin a alrgenos. Reconocer situaciones que causan estrs e idear un plan para controlarlo. Si la dermatitis atpica no mejora con medicamentos o si est presente en todo el cuerpo (diseminada), puede utilizarse un tratamiento con un tipo de luz especfico (fototerapia). Siga estas instrucciones en su casa: Cuidado de la piel  Mantenga la piel bien humectada. Al hacerlo, quedar hmeda y ayudar a prevenir la sequedad. Utilice lociones sin perfume que contengan vaselina. Evite las lociones que contienen alcohol o agua. Pueden secar la piel. Tome baos o duchas de corta duracin (menos de 5 minutos) en agua tibia. No use agua caliente. Use jabones suaves y sin perfume para baarse. Evite el jabn y el bao de espuma. Aplique un humectante para la piel inmediatamente despus de un bao o una ducha. No aplique nada sobre la piel sin Teacher, adult education a su mdico. Instrucciones generales Use o aplquese los medicamentos de venta libre y los recetados solamente como se lo haya indicado el mdico. Vstase con ropa de algodn o International aid/development worker de algodn. Vstase con ropas ligeras, ya que el calor aumenta la picazn. [place], enjuguela dos veces para eliminar todo el [place]. Evite cualquier factor desencadenante que pueda causar un brote. [place] uas cortas. Evite rascarse. El rascado hace que la erupcin y la picazn empeoren. Una rotura en la piel por rascarse podra provocar una infeccin cutnea (imptigo). No est cerca de personas que tengan herpes labial o ampollas febriles. Si  se produce la infeccin, puede hacer que la dermatitis atpica empeore. Cumpla con todas las visitas de  seguimiento. Esto es importante. Comunquese con un mdico si: La picazn le impide dormir. La erupcin empeora o no mejora en el plazo de una semana despus de iniciar el tratamiento. Tiene fiebre. Aparece un brote despus de estar en contacto con alguien que tiene herpes labial o ampollas febriles. Solicite ayuda de inmediato si: Tiene pus o costras amarillas en la zona de la erupcin. Resumen La dermatitis atpica ocasiona una erupcin roja y piel seca y escamosa con comezn. El tratamiento se enfoca en controlar la picazn y el rascado, limitar la exposicin a cosas a las que es sensible o Air cabin crew (alrgenos), reconocer situaciones que causan estrs e idear un plan para Engineer, maintenance (IT). Mantenga la piel bien humectada. Tome baos o duchas de menos de 5 minutos y use agua tibia. No use agua caliente. Esta informacin no tiene Marine scientist el consejo del mdico. Asegrese de hacerle al mdico cualquier pregunta que tenga. Document Revised: 11/12/2019 Document Reviewed: 11/12/2019 Elsevier Patient Education  2022 [employer].

## 2021-01-05 NOTE — Progress Notes (Signed)
Established Patient Office Visit  Subjective:  Patient ID: Amanda Duarte, female    DOB: Jun 03, 1955  Age: 65 y.o. MRN: [id number]  CC:  Chief Complaint  Patient presents with   Rash     HPI [name] reports that she has been having a rash on both of her arms, her upper back and her chest for the past month.  Reports it is very itchy, states itching becomes worse at night.  Denies any new lotions, medications, body washes, detergents, fragrances.  Reports that she has tried an over-the-counter lotion without relief.  Due to language barrier, an interpreter was present during the history-taking and subsequent discussion (and for part of the physical exam) with this patient.    Past Medical History:  Diagnosis Date   Cancer ([place]) 03/2017   left breast cancer   Diabetes mellitus without complication ([place])    Hyperlipidemia    Hypertension    Malignant neoplasm of left breast ([place])    Stroke (cerebrum) ([place])    Tibial plateau fracture, left    04-13-17 had ORIF    Past Surgical History:  Procedure Laterality Date   MASTECTOMY Left 2019   MASTECTOMY MODIFIED RADICAL Left 11/20/2017   Procedure: LEFT MODIFIED RADICAL MASTECTOMY;  Surgeon: Fanny Skates, MD;  Location: [place];  Service: General;  Laterality: Left;   ORIF TIBIA PLATEAU Left 04/13/2017   Procedure: OPEN REDUCTION INTERNAL FIXATION (ORIF) TIBIAL PLATEAU;  Surgeon: Shona Needles, MD;  Location: [place];  Service: Orthopedics;  Laterality: Left;   PORT-A-CATH REMOVAL Right 11/20/2017   Procedure: REMOVAL PORT-A-CATH;  Surgeon: Fanny Skates, MD;  Location: [facility] OR;  Service: General;  Laterality: Right;   PORTACATH PLACEMENT Right 05/31/2017   Procedure: INSERTION PORT-A-CATH;  Surgeon: Fanny Skates, MD;  Location: [place];  Service: General;  Laterality: Right;    Family History  Problem Relation Age of Onset   Heart Problems Mother    Diabetes Son    Breast cancer Neg Hx     Colon cancer Neg Hx    Rectal cancer Neg Hx    Stomach cancer Neg Hx    Esophageal cancer Neg Hx     Social History   Socioeconomic History   Marital status: Married    Spouse name: Not on file   Number of children: Not on file   Years of education: Not on file   Highest education level: Not on file  Occupational History   Not on file  Tobacco Use   Smoking status: Never   Smokeless tobacco: Never  Vaping Use   Vaping Use: Never used  Substance and Sexual Activity   Alcohol use: No   Drug use: No   Sexual activity: Not Currently    Birth control/protection: None  Other Topics Concern   Not on file  Social History Narrative   Not on file   Social Determinants of Health   Financial Resource Strain: Not on file  Food Insecurity: Not on file  Transportation Needs: Not on file  Physical Activity: Not on file  Stress: Not on file  Social Connections: Not on file  Intimate Partner Violence: Not on file    Outpatient Medications Prior to Visit  Medication Sig Dispense Refill   Accu-Chek Softclix Lancets lancets Use to check blood sugar 3 times daily. E11.65 100 each 3   atorvastatin (LIPITOR) 40 MG tablet TAKE 1 TABLET BY MOUTH ONCE A DAY 90 tablet 2   Blood Glucose Monitoring  Suppl (ACCU-CHEK GUIDE ME) w/Device KIT Use to check blood sugar 3 times daily. E11.65 1 kit 0   Blood Pressure Monitor DEVI Please provide patient with insurance approved blood pressure monitor 1 each 0   citalopram (CELEXA) 20 MG tablet TAKE 1 TABLET BY MOUTH ONCE A DAY 90 tablet 3   fenofibrate (TRICOR) 145 MG tablet TAKE 1 TABLET BY MOUTH ONCE A DAY 90 tablet 2   glimepiride (AMARYL) 4 MG tablet Take 1 tablet (4 mg total) by mouth in the morning and at bedtime. 180 tablet 1   glucose blood (ACCU-CHEK GUIDE) test strip Use to check blood sugar 3 times daily. E11.65 100 each 11   hydrochlorothiazide (HYDRODIURIL) 25 MG tablet TAKE 1 TABLET (25 MG TOTAL) BY MOUTH DAILY. 90 tablet 0   insulin  glargine (LANTUS) 100 UNIT/ML Solostar Pen Inject 15 Units into the skin daily. 13.5 mL 3   Insulin Pen Needle 31G X 5 MM MISC USE AS INSTRUCTED. INJECT INTO THE SKIN ONCE NIGHTLY. (Patient not taking: Reported on 11/13/2020) 100 each 6   losartan (COZAAR) 50 MG tablet TAKE 1 TABLET BY MOUTH ONCE A DAY 90 tablet 0   Na Sulfate-K Sulfate-Mg Sulf (SUPREP BOWEL PREP KIT) 17.5-3.13-1.6 GM/177ML SOLN Take 1 kit by mouth as directed. For colonoscopy prep (Patient not taking: Reported on 11/13/2020) 354 mL 0   potassium chloride SA (KLOR-CON) 20 MEQ tablet Take 1 tablet (20 mEq total) by mouth daily for 5 days. 5 tablet 0   sitaGLIPtin-metformin (JANUMET) 50-1000 MG tablet TAKE 1 TABLET BY MOUTH 2 TIMES DAILY 180 tablet 1   Facility-Administered Medications Prior to Visit  Medication Dose Route Frequency Provider Last Rate Last Admin   0.9 %  sodium chloride infusion  500 mL Intravenous Once Thornton Park, MD        Allergies  Allergen Reactions   Victoza [Liraglutide] Nausea And Vomiting    ROS Review of Systems  Constitutional: Negative.   HENT: Negative.    Eyes: Negative.   Respiratory:  Negative for shortness of breath.   Cardiovascular:  Negative for chest pain.  Gastrointestinal: Negative.   Endocrine: Negative.   Genitourinary: Negative.   Musculoskeletal: Negative.   Skin:  Positive for rash.  Allergic/Immunologic: Negative.   Neurological: Negative.   Hematological: Negative.   Psychiatric/Behavioral: Negative.       Objective:    Physical Exam Vitals and nursing note reviewed.  Constitutional:      Appearance: Normal appearance.  HENT:     Head: Normocephalic and atraumatic.     Right Ear: External ear normal.     Left Ear: External ear normal.     Nose: Nose normal.     Mouth/Throat:     Mouth: Mucous membranes are moist.     Pharynx: Oropharynx is clear.  Eyes:     Extraocular Movements: Extraocular movements intact.     Conjunctiva/sclera: Conjunctivae  normal.     Pupils: Pupils are equal, round, and reactive to light.  Cardiovascular:     Rate and Rhythm: Normal rate and regular rhythm.     Pulses: Normal pulses.     Heart sounds: Normal heart sounds.  Pulmonary:     Effort: Pulmonary effort is normal.     Breath sounds: Normal breath sounds.  Musculoskeletal:     Cervical back: Normal range of motion and neck supple.  Skin:    Findings: Erythema and rash present. Rash is vesicular. Rash is not pustular.  Comments: Very small vesicles in different stages of healing scattered on both arms, upper back and upper chest  Neurological:     General: No focal deficit present.     Mental Status: She is alert and oriented to person, place, and time.  Psychiatric:        Mood and Affect: Mood normal.        Thought Content: Thought content normal.        Judgment: Judgment normal.    BP 135/60 (BP Location: Left Arm, Patient Position: Sitting, Cuff Size: Normal)    Pulse 72    Temp 98.2 F (36.8 C) (Oral)    Resp 18    Ht _0  (1.575 m)    Wt 125 lb (56.7 kg)    SpO2 96%    BMI 22.86 kg/m  Wt Readings from Last 3 Encounters:  01/05/21 125 lb (56.7 kg)  11/13/20 128 lb 4 oz (58.2 kg)  09/07/20 132 lb 3.2 oz (60 kg)     Health Maintenance Due  Topic Date Due   Zoster Vaccines- Shingrix (1 of 2) Never done   FOOT EXAM  03/27/2019   COVID-19 Vaccine (3 - Pfizer risk series) 06/06/2019   INFLUENZA VACCINE  08/24/2020   PAP SMEAR-Modifier  12/26/2020    There are no preventive care reminders to display for this patient.  Lab Results  Component Value Date   TSH 1.319 05/19/2014   Lab Results  Component Value Date   WBC 6.0 09/07/2020   HGB 11.8 (L) 09/07/2020   HCT 34.9 (L) 09/07/2020   MCV 95.4 09/07/2020   PLT 142 (L) 09/07/2020   Lab Results  Component Value Date   NA 136 09/07/2020   K 4.5 09/07/2020   CO2 25 09/07/2020   GLUCOSE 353 (H) 09/07/2020   BUN 9 09/07/2020   CREATININE 0.77 09/07/2020   BILITOT 0.4  09/07/2020   ALKPHOS 92 09/07/2020   AST 26 09/07/2020   ALT 29 09/07/2020   PROT 7.8 09/07/2020   ALBUMIN 3.8 09/07/2020   CALCIUM 8.9 09/07/2020   ANIONGAP 12 09/07/2020   EGFR 82 06/30/2020   GFR 84.19 08/16/2019   Lab Results  Component Value Date   CHOL 127 06/30/2020   Lab Results  Component Value Date   HDL 47 06/30/2020   Lab Results  Component Value Date   LDLCALC 54 06/30/2020   Lab Results  Component Value Date   TRIG 150 (H) 06/30/2020   Lab Results  Component Value Date   CHOLHDL 2.7 06/30/2020   Lab Results  Component Value Date   HGBA1C 10.8 (A) 11/13/2020      Assessment & Plan:   Problem List Items Addressed This Visit       Endocrine   Type 2 diabetes mellitus with complication, without long-term current use of insulin (HCC) (Chronic)   Other Visit Diagnoses     Dermatitis    -  Primary   Relevant Medications   triamcinolone cream (KENALOG) 0.1 %   hydrOXYzine (ATARAX) 10 MG tablet       Meds ordered this encounter  Medications   triamcinolone cream (KENALOG) 0.1 %    Sig: Apply 1 application topically 2 (two) times daily.    Dispense:  80 g    Refill:  1    Order Specific Question:   Supervising Provider    Answer:   Asencion Noble E [[id number]]   hydrOXYzine (ATARAX) 10 MG tablet    Sig:  Take 1 tablet (10 mg total) by mouth 3 (three) times daily as needed for itching.    Dispense:  30 tablet    Refill:  0    Order Specific Question:   Supervising Provider    Answer:   Joya Gaskins, PATRICK E [[id number]]   1. Dermatitis Patient A1c November 13, 2020 was 10.8.  Will opt for topical treatment versus systemic treatment due to elevated A1c.  Patient education given on supportive care, trial Kenalog, trial hydroxyzine.  Red flags given for prompt reevaluation - triamcinolone cream (KENALOG) 0.1 %; Apply 1 application topically 2 (two) times daily.  Dispense: 80 g; Refill: 1 - hydrOXYzine (ATARAX) 10 MG tablet; Take 1 tablet (10 mg total) by mouth  3 (three) times daily as needed for itching.  Dispense: 30 tablet; Refill: 0  2. Type 2 diabetes mellitus with complication, without long-term current use of insulin (HCC)    I have reviewed the patient's medical history (PMH, PSH, Social History, Family History, Medications, and allergies) , and have been updated if relevant. I spent 20 minutes reviewing chart and  face to face time with patient.    Follow-up: Return if symptoms worsen or fail to improve.    Loraine Grip Mayers, PA-C

## 2021-01-05 NOTE — Progress Notes (Signed)
Patient has eaten and taken medication today Patient complains of itching and rash being present for the past month. Patient reports itching increased at night with no pain or tenderness. Patient used a cream that provided minimal relief.

## 2021-02-12 ENCOUNTER — Ambulatory Visit: Payer: Medicare Other | Admitting: Pharmacist

## 2021-02-15 ENCOUNTER — Other Ambulatory Visit: Payer: Self-pay | Admitting: Nurse Practitioner

## 2021-02-15 ENCOUNTER — Other Ambulatory Visit: Payer: Self-pay

## 2021-02-15 ENCOUNTER — Encounter: Payer: Self-pay | Admitting: Hematology

## 2021-02-15 ENCOUNTER — Encounter: Payer: Self-pay | Admitting: Nurse Practitioner

## 2021-02-15 ENCOUNTER — Ambulatory Visit: Payer: Medicare Other | Attending: Nurse Practitioner | Admitting: Nurse Practitioner

## 2021-02-15 VITALS — BP 114/65 | HR 65 | Ht 62.0 in | Wt 122.2 lb

## 2021-02-15 DIAGNOSIS — Z23 Encounter for immunization: Secondary | ICD-10-CM

## 2021-02-15 DIAGNOSIS — D649 Anemia, unspecified: Secondary | ICD-10-CM

## 2021-02-15 DIAGNOSIS — Z794 Long term (current) use of insulin: Secondary | ICD-10-CM | POA: Diagnosis not present

## 2021-02-15 DIAGNOSIS — E781 Pure hyperglyceridemia: Secondary | ICD-10-CM | POA: Diagnosis not present

## 2021-02-15 DIAGNOSIS — E1165 Type 2 diabetes mellitus with hyperglycemia: Secondary | ICD-10-CM | POA: Diagnosis not present

## 2021-02-15 DIAGNOSIS — F419 Anxiety disorder, unspecified: Secondary | ICD-10-CM | POA: Diagnosis not present

## 2021-02-15 DIAGNOSIS — Z1231 Encounter for screening mammogram for malignant neoplasm of breast: Secondary | ICD-10-CM

## 2021-02-15 DIAGNOSIS — E118 Type 2 diabetes mellitus with unspecified complications: Secondary | ICD-10-CM

## 2021-02-15 DIAGNOSIS — I10 Essential (primary) hypertension: Secondary | ICD-10-CM | POA: Diagnosis not present

## 2021-02-15 DIAGNOSIS — F32A Depression, unspecified: Secondary | ICD-10-CM

## 2021-02-15 DIAGNOSIS — F321 Major depressive disorder, single episode, moderate: Secondary | ICD-10-CM

## 2021-02-15 DIAGNOSIS — L309 Dermatitis, unspecified: Secondary | ICD-10-CM

## 2021-02-15 LAB — POCT URINALYSIS DIP (CLINITEK)
Bilirubin, UA: NEGATIVE
Blood, UA: NEGATIVE
Glucose, UA: 500 mg/dL — AB
Leukocytes, UA: NEGATIVE
Nitrite, UA: NEGATIVE
POC PROTEIN,UA: NEGATIVE
Spec Grav, UA: 1.02 (ref 1.010–1.025)
Urobilinogen, UA: 0.2 E.U./dL
pH, UA: 5.5 (ref 5.0–8.0)

## 2021-02-15 LAB — POCT GLYCOSYLATED HEMOGLOBIN (HGB A1C): Hemoglobin A1C: 15 % — AB (ref 4.0–5.6)

## 2021-02-15 LAB — GLUCOSE, POCT (MANUAL RESULT ENTRY)
POC Glucose: 416 mg/dl — AB (ref 70–99)
POC Glucose: 445 mg/dl — AB (ref 70–99)

## 2021-02-15 MED ORDER — INSULIN GLARGINE 100 UNIT/ML SOLOSTAR PEN
20.0000 [IU] | PEN_INJECTOR | Freq: Every day | SUBCUTANEOUS | 1 refills | Status: DC
  Filled 2021-02-15: qty 18, 90d supply, fill #0
  Filled 2021-05-24: qty 18, 90d supply, fill #1

## 2021-02-15 MED ORDER — ACCU-CHEK GUIDE VI STRP
ORAL_STRIP | 11 refills | Status: DC
  Filled 2021-02-15: qty 100, 25d supply, fill #0

## 2021-02-15 MED ORDER — ACCU-CHEK GUIDE W/DEVICE KIT
PACK | 0 refills | Status: DC
Start: 2021-02-15 — End: 2022-05-16
  Filled 2021-02-15: qty 1, 30d supply, fill #0

## 2021-02-15 MED ORDER — HYDROCHLOROTHIAZIDE 25 MG PO TABS
ORAL_TABLET | Freq: Every day | ORAL | 0 refills | Status: DC
  Filled 2021-02-15: qty 90, 90d supply, fill #0

## 2021-02-15 MED ORDER — SITAGLIPTIN PHOS-METFORMIN HCL 50-1000 MG PO TABS
1.0000 | ORAL_TABLET | Freq: Two times a day (BID) | ORAL | 1 refills | Status: DC
  Filled 2021-02-15: qty 180, 90d supply, fill #0
  Filled 2021-05-24: qty 180, 90d supply, fill #1

## 2021-02-15 MED ORDER — ACCU-CHEK SOFTCLIX LANCETS MISC
3 refills | Status: DC
  Filled 2021-02-15: qty 100, 33d supply, fill #0

## 2021-02-15 MED ORDER — CITALOPRAM HYDROBROMIDE 20 MG PO TABS
ORAL_TABLET | Freq: Every day | ORAL | 3 refills | Status: DC
  Filled 2021-02-15: qty 90, 90d supply, fill #0

## 2021-02-15 MED ORDER — FENOFIBRATE 145 MG PO TABS
ORAL_TABLET | Freq: Every day | ORAL | 2 refills | Status: DC
  Filled 2021-02-15: qty 90, 90d supply, fill #0
  Filled 2021-05-24: qty 90, 90d supply, fill #1

## 2021-02-15 MED ORDER — INSULIN ASPART 100 UNIT/ML IJ SOLN
20.0000 [IU] | Freq: Once | INTRAMUSCULAR | Status: AC
  Administered 2021-02-15: 20 [IU] via SUBCUTANEOUS

## 2021-02-15 MED ORDER — HYDROXYZINE HCL 10 MG PO TABS
10.0000 mg | ORAL_TABLET | Freq: Three times a day (TID) | ORAL | 3 refills | Status: DC | PRN
  Filled 2021-02-15 (×2): qty 60, 20d supply, fill #0
  Filled 2021-05-24: qty 60, 20d supply, fill #1

## 2021-02-15 MED ORDER — LOSARTAN POTASSIUM 50 MG PO TABS
ORAL_TABLET | Freq: Every day | ORAL | 0 refills | Status: DC
  Filled 2021-02-15: qty 90, 90d supply, fill #0

## 2021-02-15 MED ORDER — ATORVASTATIN CALCIUM 40 MG PO TABS
ORAL_TABLET | Freq: Every day | ORAL | 2 refills | Status: DC
Start: 2021-02-15 — End: 2021-10-05
  Filled 2021-02-15: qty 90, 90d supply, fill #0
  Filled 2021-05-24: qty 90, 90d supply, fill #1

## 2021-02-15 MED ORDER — GLIMEPIRIDE 4 MG PO TABS
4.0000 mg | ORAL_TABLET | Freq: Two times a day (BID) | ORAL | 1 refills | Status: DC
Start: 2021-02-15 — End: 2021-10-05
  Filled 2021-02-15: qty 180, 90d supply, fill #0
  Filled 2021-05-24: qty 180, 90d supply, fill #1

## 2021-02-15 MED ORDER — INSULIN PEN NEEDLE 31G X 5 MM MISC
6 refills | Status: DC
  Filled 2021-02-15: qty 100, 90d supply, fill #0

## 2021-02-15 NOTE — Progress Notes (Signed)
Assessment & Plan:  Amanda Duarte was seen today for diabetes.  Diagnoses and all orders for this visit:  Type 2 diabetes mellitus with hyperglycemia, with long-term current use of insulin (HCC) -     POCT glycosylated hemoglobin (Hb A1C) -     POCT glucose (manual entry) -     insulin glargine (LANTUS) 100 UNIT/ML Solostar Pen; Inject 20 Units into the skin daily. -     sitaGLIPtin-metformin (JANUMET) 50-1000 MG tablet; TAKE 1 TABLET BY MOUTH 2 TIMES DAILY -     glimepiride (AMARYL) 4 MG tablet; Take 1 tablet (4 mg total) by mouth in the morning and at bedtime. -     Insulin Pen Needle 31G X 5 MM MISC; USE AS INSTRUCTED. INJECT INTO THE SKIN ONCE NIGHTLY. -     CMP14+EGFR -     Blood Glucose Monitoring Suppl (ACCU-CHEK GUIDE) w/Device KIT; use kit to check blood glucose three times daily -     glucose blood (ACCU-CHEK GUIDE) test strip; Use to check blood sugar 3 times daily. E11.65 -     Accu-Chek Softclix Lancets lancets; Use to check blood sugar 3 times daily. E11.65 -     POCT URINALYSIS DIP (CLINITEK) -     insulin aspart (novoLOG) injection 20 Units -     POCT glucose (manual entry) -     insulin lispro (HUMALOG) 100 UNIT/ML injection; Inject 0.04 mLs (4 Units total) into the skin 3 (three) times daily before meals. -     Insulin Syringes, Disposable, U-100 1 ML MISC; Use as instructed. Inject into the skin three times daily Continue blood sugar control as discussed in office today, low carbohydrate diet, and regular physical exercise as tolerated, 150 minutes per week (30 min each day, 5 days per week, or 50 min 3 days per week). Keep blood sugar logs with fasting goal of 90-130 mg/dl, post prandial (after you eat) less than 180.  For Hypoglycemia: BS <60 and Hyperglycemia BS >400; contact the clinic ASAP. Annual eye exams and foot exams are recommended.   Essential hypertension -     hydrochlorothiazide (HYDRODIURIL) 25 MG tablet; TAKE 1 TABLET (25 MG TOTAL) BY MOUTH DAILY. -      losartan (COZAAR) 50 MG tablet; TAKE 1 TABLET BY MOUTH ONCE A DAY Continue all antihypertensives as prescribed.  Remember to bring in your blood pressure log with you for your follow up appointment.  DASH/Mediterranean Diets are healthier choices for HTN.    Pure hypertriglyceridemia -     fenofibrate (TRICOR) 145 MG tablet; TAKE 1 TABLET BY MOUTH ONCE A DAY -     atorvastatin (LIPITOR) 40 MG tablet; TAKE 1 TABLET BY MOUTH ONCE A DAY INSTRUCTIONS: Work on a low fat, heart healthy diet and participate in regular aerobic exercise program by working out at least 150 minutes per week; 5 days a week-30 minutes per day. Avoid red meat/beef/steak,  fried foods. junk foods, sodas, sugary drinks, unhealthy snacking, alcohol and smoking.  Drink at least 80 oz of water per day and monitor your carbohydrate intake daily.    Anxiety and depression -     citalopram (CELEXA) 20 MG tablet; TAKE 1 TABLET BY MOUTH ONCE A DAY -     hydrOXYzine (ATARAX) 10 MG tablet; Take 1 tablet (10 mg total) by mouth 3 (three) times daily as needed for anxiety.  Anemia, unspecified type -     CBC with Differential  Need for influenza vaccination -  Flu Vaccine QUAD 48mo+IM (Fluarix, Fluzone & Alfiuria Quad PF)    Patient has been counseled on age-appropriate routine health concerns for screening and prevention. These are reviewed and up-to-date. Referrals have been placed accordingly. Immunizations are up-to-date or declined.    Subjective:   Chief Complaint  Patient presents with   Diabetes   HPI Amanda Duarte 66 y.o. female presents to office today for follow up to DM, HTN and Anxiety and Depression She has a past medical history of Cancer(03/2017), DM 2, Hyperlipidemia, Hypertension, Malignant neoplasm of left breast, Stroke (cerebrum), and Tibial plateau fracture, left.   VRI was used to communicate directly with patient for the entire encounter including providing detailed patient instructions.    DM  2 Poorly controlled. She required 20 units of novolog today in the office due to severe hyperglycemia.  She does not have her meter with her today. We are increasing her lantus to 20 units today and adding humalog 4 units TID.  She will continue on Janumet 50-1000 mg BID. She has been in [place] the past several months and this likely has contributed to her A1c increase.  LDL at goal with tricor and lipitor.  Lab Results  Component Value Date   HGBA1C 15.0 (A) 02/15/2021   Lab Results  Component Value Date   LDLCALC 54 06/30/2020    HTN Well controlled with losartan 50 mg daily and HCTZ 25 mg aily.  BP Readings from Last 3 Encounters:  02/15/21 114/65  01/05/21 135/60  11/13/20 131/77    Review of Systems  Constitutional:  Negative for fever, malaise/fatigue and weight loss.  HENT: Negative.  Negative for nosebleeds.   Eyes: Negative.  Negative for blurred vision, double vision and photophobia.  Respiratory: Negative.  Negative for cough and shortness of breath.   Cardiovascular: Negative.  Negative for chest pain, palpitations and leg swelling.  Gastrointestinal: Negative.  Negative for heartburn, nausea and vomiting.  Musculoskeletal: Negative.  Negative for myalgias.  Neurological: Negative.  Negative for dizziness, focal weakness, seizures and headaches.  Psychiatric/Behavioral:  Positive for depression. Negative for suicidal ideas. The patient is nervous/anxious.    Past Medical History:  Diagnosis Date   Cancer ([place]) 03/2017   left breast cancer   Diabetes mellitus without complication ([place])    Hyperlipidemia    Hypertension    Malignant neoplasm of left breast ([place])    Stroke (cerebrum) ([place])    Tibial plateau fracture, left    04-13-17 had ORIF    Past Surgical History:  Procedure Laterality Date   MASTECTOMY Left 2019   MASTECTOMY MODIFIED RADICAL Left 11/20/2017   Procedure: LEFT MODIFIED RADICAL MASTECTOMY;  Surgeon: Fanny Skates, MD;  Location: [place];   Service: General;  Laterality: Left;   ORIF TIBIA PLATEAU Left 04/13/2017   Procedure: OPEN REDUCTION INTERNAL FIXATION (ORIF) TIBIAL PLATEAU;  Surgeon: Shona Needles, MD;  Location: [place];  Service: Orthopedics;  Laterality: Left;   PORT-A-CATH REMOVAL Right 11/20/2017   Procedure: REMOVAL PORT-A-CATH;  Surgeon: Fanny Skates, MD;  Location: [place];  Service: General;  Laterality: Right;   PORTACATH PLACEMENT Right 05/31/2017   Procedure: INSERTION PORT-A-CATH;  Surgeon: Fanny Skates, MD;  Location: [place];  Service: General;  Laterality: Right;    Family History  Problem Relation Age of Onset   Heart Problems Mother    Diabetes Son    Breast cancer Neg Hx    Colon cancer Neg Hx    Rectal cancer Neg Hx  Stomach cancer Neg Hx    Esophageal cancer Neg Hx     Social History Reviewed with no changes to be made today.   Outpatient Medications Prior to Visit  Medication Sig Dispense Refill   Blood Pressure Monitor DEVI Please provide patient with insurance approved blood pressure monitor 1 each 0   Na Sulfate-K Sulfate-Mg Sulf (SUPREP BOWEL PREP KIT) 17.5-3.13-1.6 GM/177ML SOLN Take 1 kit by mouth as directed. For colonoscopy prep 354 mL 0   triamcinolone cream (KENALOG) 0.1 % Apply 1 application topically 2 (two) times daily. 80 g 1   Accu-Chek Softclix Lancets lancets Use to check blood sugar 3 times daily. E11.65 100 each 3   atorvastatin (LIPITOR) 40 MG tablet TAKE 1 TABLET BY MOUTH ONCE A DAY 90 tablet 2   Blood Glucose Monitoring Suppl (ACCU-CHEK GUIDE ME) w/Device KIT Use to check blood sugar 3 times daily. E11.65 1 kit 0   fenofibrate (TRICOR) 145 MG tablet TAKE 1 TABLET BY MOUTH ONCE A DAY 90 tablet 2   glucose blood (ACCU-CHEK GUIDE) test strip Use to check blood sugar 3 times daily. E11.65 100 each 11   hydrochlorothiazide (HYDRODIURIL) 25 MG tablet TAKE 1 TABLET (25 MG TOTAL) BY MOUTH DAILY. 90 tablet 0   hydrOXYzine (ATARAX) 10 MG tablet Take 1 tablet  (10 mg total) by mouth 3 (three) times daily as needed for itching. 30 tablet 0   Insulin Pen Needle 31G X 5 MM MISC USE AS INSTRUCTED. INJECT INTO THE SKIN ONCE NIGHTLY. 100 each 6   losartan (COZAAR) 50 MG tablet TAKE 1 TABLET BY MOUTH ONCE A DAY 90 tablet 0   sitaGLIPtin-metformin (JANUMET) 50-1000 MG tablet TAKE 1 TABLET BY MOUTH 2 TIMES DAILY 180 tablet 1   potassium chloride SA (KLOR-CON) 20 MEQ tablet Take 1 tablet (20 mEq total) by mouth daily for 5 days. 5 tablet 0   citalopram (CELEXA) 20 MG tablet TAKE 1 TABLET BY MOUTH ONCE A DAY 90 tablet 3   glimepiride (AMARYL) 4 MG tablet Take 1 tablet (4 mg total) by mouth in the morning and at bedtime. 180 tablet 1   insulin glargine (LANTUS) 100 UNIT/ML Solostar Pen Inject 15 Units into the skin daily. 13.5 mL 3   Facility-Administered Medications Prior to Visit  Medication Dose Route Frequency Provider Last Rate Last Admin   0.9 %  sodium chloride infusion  500 mL Intravenous Once Thornton Park, MD        Allergies  Allergen Reactions   Victoza [Liraglutide] Nausea And Vomiting       Objective:    BP 114/65    Pulse 65    Ht 5' 2"  (1.575 m)    Wt 122 lb 4 oz (55.5 kg)    SpO2 97%    BMI 22.36 kg/m  Wt Readings from Last 3 Encounters:  02/15/21 122 lb 4 oz (55.5 kg)  01/05/21 125 lb (56.7 kg)  11/13/20 128 lb 4 oz (58.2 kg)    Physical Exam Vitals and nursing note reviewed.  Constitutional:      Appearance: She is well-developed.  HENT:     Head: Normocephalic and atraumatic.  Cardiovascular:     Rate and Rhythm: Normal rate and regular rhythm.     Heart sounds: Normal heart sounds. No murmur heard.   No friction rub. No gallop.  Pulmonary:     Effort: Pulmonary effort is normal. No tachypnea or respiratory distress.     Breath sounds: Normal breath sounds.  No decreased breath sounds, wheezing, rhonchi or rales.  Chest:     Chest wall: No tenderness.  Abdominal:     General: Bowel sounds are normal.      Palpations: Abdomen is soft.  Musculoskeletal:        General: Normal range of motion.     Cervical back: Normal range of motion.  Skin:    General: Skin is warm and dry.  Neurological:     Mental Status: She is alert and oriented to person, place, and time.     Coordination: Coordination normal.  Psychiatric:        Behavior: Behavior normal. Behavior is cooperative.        Thought Content: Thought content normal.        Judgment: Judgment normal.         Patient has been counseled extensively about nutrition and exercise as well as the importance of adherence with medications and regular follow-up. The patient was given clear instructions to go to ER or return to medical center if symptoms don't improve, worsen or new problems develop. The patient verbalized understanding.   Follow-up: Return in about 3 months (around 05/16/2021) for  Meter check with luke in 4 weeks. See me in 3 months.   [name], FNP-BC [facility] [place], [place]   02/17/2021, 8:50 PM

## 2021-02-16 ENCOUNTER — Ambulatory Visit
Admission: RE | Admit: 2021-02-16 | Discharge: 2021-02-16 | Disposition: A | Discharge status: Home / Self Care | Payer: Medicare Other | Source: Ambulatory Visit | Attending: Nurse Practitioner | Admitting: Nurse Practitioner

## 2021-02-16 ENCOUNTER — Telehealth: Payer: Self-pay

## 2021-02-16 ENCOUNTER — Other Ambulatory Visit: Payer: Self-pay

## 2021-02-16 DIAGNOSIS — Z1231 Encounter for screening mammogram for malignant neoplasm of breast: Secondary | ICD-10-CM

## 2021-02-16 LAB — CBC WITH DIFFERENTIAL/PLATELET
Basophils Absolute: 0 10*3/uL (ref 0.0–0.2)
Basos: 1 %
EOS (ABSOLUTE): 0.3 10*3/uL (ref 0.0–0.4)
Eos: 6 %
Hematocrit: 40.4 % (ref 34.0–46.6)
Hemoglobin: 13.9 g/dL (ref 11.1–15.9)
Immature Grans (Abs): 0 10*3/uL (ref 0.0–0.1)
Immature Granulocytes: 0 %
Lymphocytes Absolute: 2.1 10*3/uL (ref 0.7–3.1)
Lymphs: 39 %
MCH: 33.5 pg — ABNORMAL HIGH (ref 26.6–33.0)
MCHC: 34.4 g/dL (ref 31.5–35.7)
MCV: 97 fL (ref 79–97)
Monocytes Absolute: 0.3 10*3/uL (ref 0.1–0.9)
Monocytes: 6 %
Neutrophils Absolute: 2.7 10*3/uL (ref 1.4–7.0)
Neutrophils: 48 %
Platelets: 150 10*3/uL (ref 150–450)
RBC: 4.15 x10E6/uL (ref 3.77–5.28)
RDW: 13.4 % (ref 11.7–15.4)
WBC: 5.5 10*3/uL (ref 3.4–10.8)

## 2021-02-16 LAB — CMP14+EGFR
ALT: 17 IU/L (ref 0–32)
AST: 20 IU/L (ref 0–40)
Albumin/Globulin Ratio: 1.1 — ABNORMAL LOW (ref 1.2–2.2)
Albumin: 4.7 g/dL (ref 3.8–4.8)
Alkaline Phosphatase: 111 IU/L (ref 44–121)
BUN/Creatinine Ratio: 22 (ref 12–28)
BUN: 16 mg/dL (ref 8–27)
Bilirubin Total: 0.6 mg/dL (ref 0.0–1.2)
CO2: 27 mmol/L (ref 20–29)
Calcium: 9.7 mg/dL (ref 8.7–10.3)
Chloride: 93 mmol/L — ABNORMAL LOW (ref 96–106)
Creatinine, Ser: 0.74 mg/dL (ref 0.57–1.00)
Globulin, Total: 4.1 g/dL (ref 1.5–4.5)
Glucose: 411 mg/dL — ABNORMAL HIGH (ref 70–99)
Potassium: 3.9 mmol/L (ref 3.5–5.2)
Sodium: 133 mmol/L — ABNORMAL LOW (ref 134–144)
Total Protein: 8.8 g/dL — ABNORMAL HIGH (ref 6.0–8.5)
eGFR: 90 mL/min/{1.73_m2} (ref >59)

## 2021-02-16 NOTE — Telephone Encounter (Signed)
Called pt but vm is not set up to receive vm. With the help of Jasper Riling # [phone]

## 2021-02-16 NOTE — Telephone Encounter (Signed)
-----   Message from [name], NP sent at 02/16/2021 11:33 AM EST ----- Please let patient know she had ketones in her urine and this normally necessitates IV fluids to be given.  She would like to receive IV fluids we can direct her on where she needs to go. CBC does not indicate any anemia or bleeding disorders

## 2021-02-17 ENCOUNTER — Other Ambulatory Visit: Payer: Self-pay | Admitting: Nurse Practitioner

## 2021-02-17 ENCOUNTER — Encounter: Payer: Self-pay | Admitting: Nurse Practitioner

## 2021-02-17 DIAGNOSIS — R928 Other abnormal and inconclusive findings on diagnostic imaging of breast: Secondary | ICD-10-CM

## 2021-02-17 MED ORDER — "INSULIN SYRINGE-NEEDLE U-100 30G X 5/16"" 1 ML MISC"
6 refills | Status: DC
  Filled 2021-02-17: qty 100, 33d supply, fill #0

## 2021-02-17 MED ORDER — INSULIN LISPRO 100 UNIT/ML IJ SOLN
4.0000 [IU] | Freq: Three times a day (TID) | INTRAMUSCULAR | 11 refills | Status: DC
Start: 2021-02-17 — End: 2021-12-22
  Filled 2021-02-17 – 2021-05-24 (×2): qty 10, 28d supply, fill #0

## 2021-02-18 ENCOUNTER — Telehealth: Payer: Self-pay

## 2021-02-18 ENCOUNTER — Other Ambulatory Visit: Payer: Self-pay

## 2021-02-18 NOTE — Telephone Encounter (Signed)
Called and left vm to return call to office with the help of interpretor Cleidy # [phone].

## 2021-02-18 NOTE — Telephone Encounter (Signed)
-----   Message from [name], NP sent at 02/17/2021  7:26 PM EST ----- Abnormal mammogram. Will need [name] of right underarm area. They breast center will call you to schedule

## 2021-02-18 NOTE — Telephone Encounter (Signed)
Left message to return call to our office.  With the help of Jeanella Craze # [phone]

## 2021-02-25 ENCOUNTER — Other Ambulatory Visit: Payer: Self-pay

## 2021-02-28 ENCOUNTER — Encounter (HOSPITAL_COMMUNITY): Payer: Self-pay

## 2021-02-28 ENCOUNTER — Ambulatory Visit (HOSPITAL_COMMUNITY)
Admission: EM | Admit: 2021-02-28 | Discharge: 2021-02-28 | Disposition: A | Discharge status: Home / Self Care | Payer: Medicare Other | Attending: Emergency Medicine | Admitting: Emergency Medicine

## 2021-02-28 ENCOUNTER — Other Ambulatory Visit: Payer: Self-pay

## 2021-02-28 DIAGNOSIS — H109 Unspecified conjunctivitis: Secondary | ICD-10-CM | POA: Diagnosis not present

## 2021-02-28 MED ORDER — POLYMYXIN B-TRIMETHOPRIM 10000-0.1 UNIT/ML-% OP SOLN: 2.0000 [drp] | OPHTHALMIC | 0 refills | Status: DC

## 2021-02-28 MED ORDER — POLYMYXIN B-TRIMETHOPRIM 10000-0.1 UNIT/ML-% OP SOLN: 2.0000 [drp] | OPHTHALMIC | 0 refills | Status: AC

## 2021-02-28 NOTE — ED Provider Notes (Signed)
[facility]    CSN: [id number] Arrival date & time: 02/28/21  1649    HISTORY   Chief Complaint  Patient presents with   Eye Problem   HPI Riannon Mukherjee is a 66 y.o. female. Patient complains of irritation in her left eye for the past 4 days.  Patient states about 4 days ago she was walking outside when she felt something hit her eye, thinks it was a small piece of dust.  Patient states her eye has been itchy and irritated since then.  Patient denies eye pain, vision changes, purulent drainage from her eye.  Patient states her eyes been very red and she had clear tears more frequently than usual.  Patient states her right eye is asymptomatic at this time.  The history is provided by the patient.  Past Medical History:  Diagnosis Date   Cancer ([place]) 03/2017   left breast cancer   Diabetes mellitus without complication ([place])    Hyperlipidemia    Hypertension    Malignant neoplasm of left breast ([place])    Stroke (cerebrum) ([place])    Tibial plateau fracture, left    04-13-17 had ORIF   Patient Active Problem List   Diagnosis Date Noted   Abdominal pain 11/20/2019   Vaginal atrophy 11/20/2019   Acute vulvitis 11/20/2019   Current moderate episode of major depressive disorder without prior episode ([place]) 02/08/2019   Mixed hyperglyceridemia 06/27/2018   Port-A-Cath in place 06/09/2017   Malignant neoplasm of upper-inner quadrant of left breast in female, estrogen receptor positive ([place]) 05/22/2017   Closed fracture of left tibial plateau    Axillary lymphadenopathy 04/12/2017   Closed fracture of lateral portion of left tibial plateau 04/11/2017   Chest pain, neg MI, neg stress Nuc study, possible GI 05/19/2014   Essential hypertension 05/19/2014   Type 2 diabetes mellitus with complication, without long-term current use of insulin ([place]) 05/19/2014   Past Surgical History:  Procedure Laterality Date   MASTECTOMY Left 2019   MASTECTOMY MODIFIED RADICAL Left  11/20/2017   Procedure: LEFT MODIFIED RADICAL MASTECTOMY;  Surgeon: Fanny Skates, MD;  Location: [facility];  Service: General;  Laterality: Left;   ORIF TIBIA PLATEAU Left 04/13/2017   Procedure: OPEN REDUCTION INTERNAL FIXATION (ORIF) TIBIAL PLATEAU;  Surgeon: Shona Needles, MD;  Location: [facility];  Service: Orthopedics;  Laterality: Left;   PORT-A-CATH REMOVAL Right 11/20/2017   Procedure: REMOVAL PORT-A-CATH;  Surgeon: Fanny Skates, MD;  Location: [facility];  Service: General;  Laterality: Right;   PORTACATH PLACEMENT Right 05/31/2017   Procedure: INSERTION PORT-A-CATH;  Surgeon: Fanny Skates, MD;  Location: [facility];  Service: General;  Laterality: Right;   OB History   No obstetric history on file.    Home Medications    Prior to Admission medications   Medication Sig Start Date End Date Taking? Authorizing Provider  Accu-Chek Softclix Lancets lancets Use to check blood sugar 3 times daily. E11.65 02/15/21   [name], NP  atorvastatin (LIPITOR) 40 MG tablet TAKE 1 TABLET BY MOUTH ONCE A DAY 02/15/21 02/15/22  [name], NP  Blood Glucose Monitoring Suppl (ACCU-CHEK GUIDE) w/Device KIT use kit to check blood glucose three times daily 02/15/21   [name], NP  Blood Pressure Monitor DEVI Please provide patient with insurance approved blood pressure monitor 01/14/19   [name], NP  citalopram (CELEXA) 20 MG tablet TAKE 1 TABLET BY MOUTH ONCE A DAY 02/15/21 05/17/21  [name]  W, NP  fenofibrate (TRICOR) 145 MG tablet TAKE 1 TABLET BY MOUTH ONCE A DAY 02/15/21 02/15/22  [name], NP  glimepiride (AMARYL) 4 MG tablet Take 1 tablet (4 mg total) by mouth in the morning and at bedtime. 02/15/21 05/17/21  [name], NP  glucose blood (ACCU-CHEK GUIDE) test strip Use to check blood sugar 3 times daily. E11.65 02/15/21   [name], NP  hydrochlorothiazide (HYDRODIURIL) 25 MG tablet TAKE 1 TABLET (25 MG TOTAL) BY MOUTH DAILY. 02/15/21  02/15/22  [name], NP  hydrOXYzine (ATARAX) 10 MG tablet Take 1 tablet (10 mg total) by mouth 3 (three) times daily as needed for anxiety. 02/15/21   [name], NP  insulin glargine (LANTUS) 100 UNIT/ML Solostar Pen Inject 20 Units into the skin daily. 02/15/21 05/17/21  [name], NP  insulin lispro (HUMALOG) 100 UNIT/ML injection Inject 0.04 mLs (4 Units total) into the skin 3 (three) times daily before meals. 02/17/21   [name], NP  Insulin Pen Needle 31G X 5 MM MISC USE AS INSTRUCTED. INJECT INTO THE SKIN ONCE NIGHTLY. 02/15/21 02/15/22  [name], NP  Insulin Syringe-Needle U-100 30G X 5/16" 1 ML MISC Use as directed to inject into the skin 3 times daily. 02/17/21   [name], NP  losartan (COZAAR) 50 MG tablet TAKE 1 TABLET BY MOUTH ONCE A DAY 02/15/21 02/15/22  [name], NP  Na Sulfate-K Sulfate-Mg Sulf (SUPREP BOWEL PREP KIT) 17.5-3.13-1.6 GM/177ML SOLN Take 1 kit by mouth as directed. For colonoscopy prep 08/16/19   Thornton Park, MD  potassium chloride SA (KLOR-CON) 20 MEQ tablet Take 1 tablet (20 mEq total) by mouth daily for 5 days. 07/05/20 07/13/20  [name], NP  sitaGLIPtin-metformin (JANUMET) 50-1000 MG tablet TAKE 1 TABLET BY MOUTH 2 TIMES DAILY 02/15/21 02/15/22  [name], NP  triamcinolone cream (KENALOG) 0.1 % Apply 1 application topically 2 (two) times daily. 01/05/21   Mayers, Loraine Grip, PA-C    Family History Family History  Problem Relation Age of Onset   Heart Problems Mother    Diabetes Son    Breast cancer Neg Hx    Colon cancer Neg Hx    Rectal cancer Neg Hx    Stomach cancer Neg Hx    Esophageal cancer Neg Hx    Social History Social History   Tobacco Use   Smoking status: Never   Smokeless tobacco: Never  Vaping Use   Vaping Use: Never used  Substance Use Topics   Alcohol use: No   Drug use: No   Allergies   Victoza [liraglutide]  Review of Systems Review of Systems Pertinent findings noted  in history of present illness.   Physical Exam Triage Vital Signs ED Triage Vitals  Enc Vitals Group     BP 11/20/20 0827 (!) 147/82     Pulse Rate 11/20/20 0827 72     Resp 11/20/20 0827 18     Temp 11/20/20 0827 98.3 F (36.8 C)     Temp Source 11/20/20 0827 Oral     SpO2 11/20/20 0827 98 %     Weight --      Height --      Head Circumference --      Peak Flow --      Pain Score 11/20/20 0826 5     Pain Loc --      Pain Edu? --  Excl. in GC? --   No data found.  Updated Vital Signs BP (!) 129/55 (BP Location: Right Arm)    Pulse 62    Temp 98 F (36.7 C) (Oral)    Resp 18    SpO2 99%   Physical Exam Vitals and nursing note reviewed.  Constitutional:      General: She is not in acute distress.    Appearance: Normal appearance. She is not ill-appearing.  HENT:     Head: Normocephalic and atraumatic.     Salivary Glands: Right salivary gland is not diffusely enlarged or tender. Left salivary gland is not diffusely enlarged or tender.     Right Ear: Tympanic membrane, ear canal and external ear normal. No drainage. No middle ear effusion. There is no impacted cerumen. Tympanic membrane is not erythematous or bulging.     Left Ear: Tympanic membrane, ear canal and external ear normal. No drainage.  No middle ear effusion. There is no impacted cerumen. Tympanic membrane is not erythematous or bulging.     Nose: Nose normal. No nasal deformity, septal deviation, mucosal edema, congestion or rhinorrhea.     Right Turbinates: Not enlarged, swollen or pale.     Left Turbinates: Not enlarged, swollen or pale.     Right Sinus: No maxillary sinus tenderness or frontal sinus tenderness.     Left Sinus: No maxillary sinus tenderness or frontal sinus tenderness.     Mouth/Throat:     Lips: Pink. No lesions.     Mouth: Mucous membranes are moist. No oral lesions.     Pharynx: Oropharynx is clear. Uvula midline. No posterior oropharyngeal erythema or uvula swelling.     Tonsils: No  tonsillar exudate. 0 on the right. 0 on the left.  Eyes:     General: Lids are normal. Lids are everted, no foreign bodies appreciated. Vision grossly intact.        Right eye: No foreign body or discharge.        Left eye: No foreign body or discharge.     Extraocular Movements: Extraocular movements intact.     Conjunctiva/sclera:     Right eye: Right conjunctiva is not injected.     Left eye: Left conjunctiva is injected.     Pupils: Pupils are equal, round, and reactive to light.  Neck:     Trachea: Trachea and phonation normal.  Cardiovascular:     Rate and Rhythm: Normal rate and regular rhythm.     Pulses: Normal pulses.     Heart sounds: Normal heart sounds. No murmur heard.   No friction rub. No gallop.  Pulmonary:     Effort: Pulmonary effort is normal. No accessory muscle usage, prolonged expiration or respiratory distress.     Breath sounds: Normal breath sounds. No stridor, decreased air movement or transmitted upper airway sounds. No decreased breath sounds, wheezing, rhonchi or rales.  Chest:     Chest wall: No tenderness.  Musculoskeletal:        General: Normal range of motion.     Cervical back: Normal range of motion and neck supple. Normal range of motion.  Lymphadenopathy:     Cervical: No cervical adenopathy.  Skin:    General: Skin is warm and dry.     Findings: No erythema or rash.  Neurological:     General: No focal deficit present.     Mental Status: She is alert and oriented to person, place, and time.  Psychiatric:  Mood and Affect: Mood normal.        Behavior: Behavior normal.    Visual Acuity Right Eye Distance:   Left Eye Distance:   Bilateral Distance:    Right Eye Near:   Left Eye Near:    Bilateral Near:     UC Couse / Diagnostics / Procedures:    EKG  Radiology No results found.  Procedures Procedures (including critical care time)  UC Diagnoses / Final Clinical Impressions(s)   I have reviewed the triage vital signs  and the nursing notes.  Pertinent labs & imaging results that were available during my care of the patient were reviewed by me and considered in my medical decision making (see chart for details).    Final diagnoses:  Conjunctivitis of left eye, unspecified conjunctivitis type   Patient provided with Polytrim.  Patient advised to follow-up with ophthalmology in 2 to 3 days if not improved.  ED Prescriptions     Medication Sig Dispense Auth. Provider   trimethoprim-polymyxin b (POLYTRIM) ophthalmic solution  (Status: Discontinued) Place 2 drops into the left eye every 3 (three) hours while awake for 5 days. 10 mL Lynden Oxford Scales, PA-C   trimethoprim-polymyxin b (POLYTRIM) ophthalmic solution Place 2 drops into the left eye every 3 (three) hours while awake for 5 days. 10 mL Lynden Oxford Scales, PA-C      PDMP not reviewed this encounter.  Pending results:  Labs Reviewed - No data to display  Medications Ordered in UC: Medications - No data to display  Disposition Upon Discharge:  Condition: stable for discharge home Home: take medications as prescribed; routine discharge instructions as discussed; follow up as advised.  Patient presented with an acute illness with associated systemic symptoms and significant discomfort requiring urgent management. In my opinion, this is a condition that a prudent lay person (someone who possesses an average knowledge of health and medicine) may potentially expect to result in complications if not addressed urgently such as respiratory distress, impairment of bodily function or dysfunction of bodily organs.   Routine symptom specific, illness specific and/or disease specific instructions were discussed with the patient and/or caregiver at length.   As such, the patient has been evaluated and assessed, work-up was performed and treatment was provided in alignment with urgent care protocols and evidence based medicine.  Patient/parent/caregiver  has been advised that the patient may require follow up for further testing and treatment if the symptoms continue in spite of treatment, as clinically indicated and appropriate.  If the patient was tested for COVID-19, Influenza and/or RSV, then the patient/parent/guardian was advised to isolate at home pending the results of his/her diagnostic coronavirus test and potentially longer if theyre positive. I have also advised pt that if his/her COVID-19 test returns positive, it's recommended to self-isolate for at least 10 days after symptoms first appeared AND until fever-free for 24 hours without fever reducer AND other symptoms have improved or resolved. Discussed self-isolation recommendations as well as instructions for household member/close contacts as per the [facility] and  [facility], and also gave patient the [place] packet with this information.  Patient/parent/caregiver has been advised to return to the [facility] or PCP in 3-5 days if no better; to PCP or the Emergency Department if new signs and symptoms develop, or if the current signs or symptoms continue to change or worsen for further workup, evaluation and treatment as clinically indicated and appropriate  The patient will follow up with their current PCP if and as advised.  If the patient does not currently have a PCP we will assist them in obtaining one.   The patient may need specialty follow up if the symptoms continue, in spite of conservative treatment and management, for further workup, evaluation, consultation and treatment as clinically indicated and appropriate.   Patient/parent/caregiver verbalized understanding and agreement of plan as discussed.  All questions were addressed during visit.  Please see discharge instructions below for further details of plan.  Discharge Instructions:   Discharge Instructions      Please begin Polytrim eyedrops, 2 drops in your left eye every 3 hours while awake.  If you have not had improvement in the  redness and increased tearing after 2 to 3 days, please see your regular eye doctor for full eye exam.  Prescription has been sent to [facility] on [address] which is open 24 hours.  For your convenience, I have also provided you with a printed copy of your prescription.  Thank you for visiting urgent care today.    This office note has been dictated using Museum/gallery curator.  Unfortunately, and despite my best efforts, this method of dictation can sometimes lead to occasional typographical or grammatical errors.  I apologize in advance if this occurs.     Lynden Oxford Scales, PA-C 02/28/21 1818

## 2021-02-28 NOTE — ED Triage Notes (Signed)
Pt presents with left eye irritation X 4 days.

## 2021-02-28 NOTE — Discharge Instructions (Addendum)
Please begin Polytrim eyedrops, 2 drops in your left eye every 3 hours while awake.  If you have not had improvement in the redness and increased tearing after 2 to 3 days, please see your regular eye doctor for full eye exam.  Prescription has been sent to [facility] on [address] which is open 24 hours.  For your convenience, I have also provided you with a printed copy of your prescription.  Thank you for visiting urgent care today.

## 2021-03-10 ENCOUNTER — Inpatient Hospital Stay: Payer: Medicare Other | Attending: Hematology | Admitting: Hematology

## 2021-03-10 ENCOUNTER — Inpatient Hospital Stay: Payer: Medicare Other

## 2021-03-10 DIAGNOSIS — Z170 Estrogen receptor positive status [ER+]: Secondary | ICD-10-CM

## 2021-03-11 ENCOUNTER — Telehealth: Payer: Self-pay | Admitting: Hematology

## 2021-03-11 NOTE — Telephone Encounter (Signed)
Sch per 2/15 inbasket, called pt and left msg via interpreter.

## 2021-03-17 ENCOUNTER — Other Ambulatory Visit: Payer: Medicare Other

## 2021-03-24 NOTE — Progress Notes (Unsigned)
Amanda Duarte OFFICE PROGRESS NOTE  Amanda Pounds, NP [place] [address]  DIAGNOSIS:  f/u of left breast cancer  Oncology History Overview Note  Cancer Staging Malignant neoplasm of upper-inner quadrant of left breast in female, estrogen receptor positive ([place]) Staging form: Breast, AJCC 8th Edition - Clinical stage from 05/01/2017: Stage Unknown (cTX, cN2, cM0, G3, ER-, PR-, HER2-) - Signed by Amanda Merle, MD on 11/01/2017 - Pathologic stage from 11/20/2017: No Stage Recommended (ypT0, pN18m, cM0, GX, ER-, PR-, HER2-) - Signed by Amanda Merle MD on 12/10/2017     Malignant neoplasm of upper-inner quadrant of left breast in female, estrogen receptor positive ([name]  04/28/2017 Mammogram   IMPRESSION: Two adjacent masses/enlarged lymph nodes in the LOWER LEFT axilla, the largest measuring 2.1 cm. Tissue sampling of 1 of these is recommended to exclude malignancy/lymphoma. No mammographic evidence of breast malignancy bilaterally.    05/01/2017 Initial Biopsy   Diagnosis 05/01/17 Lymph node, needle/core biopsy, low left inferior axillary - METASTATIC POORLY DIFFERENTIATED CARCINOMA TO A LYMPH NODE. SEE NOTE.   05/01/2017 Cancer Staging   Staging form: Breast, AJCC 8th Edition - Clinical stage from 05/01/2017: Stage Unknown (cTX, cN1, cM0, G3, ER-, PR-, HER2-) - Signed by Amanda Merle MD on 06/02/2017    05/01/2017 Receptors her2   Lymph Node Biopsy:  HER2-Negative  PR-Negative  ER- Negative    05/10/2017 Imaging   MR Breast W WO Contrast 05/10/17 IMPRESSION: No MRI evidence of malignancy in the right breast. Area of week stippled non mass enhancement in the left breast upper inner quadrant. Separate area of thin linear non mass enhancement in the subareolar left breast. Three grossly abnormal left axillary lymph nodes, and more than 4 less than 1 cm indeterminate left axillary lymph nodes. No evidence of right axillary lymphadenopathy.   05/17/2017 Initial  Biopsy   Diagnosis 05/17/17 1. 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 2. Breast, left, needle core biopsy, upper inner post MR enhancement - MICROSCOPIC FOCUS OF DUCTAL CARCINOMA IN SITU.   05/17/2017 Receptors her2   Left Breast Biopsy:  ER-Negative PR-Negative HER2-Negative   05/22/2017 Initial Diagnosis   Malignant neoplasm of upper-inner quadrant of left breast in female, estrogen receptor positive ([name]   06/01/2017 Imaging   Amanda Duarte 06/01/17 IMPRESSION: No definite scintigraphic evidence of osseous metastatic disease.   Posttraumatic and postsurgical uptake at the LEFT knee.   Nonspecific soft tissue distribution of tracer at the LEFT thigh, could represent contusion, hemorrhage, soft tissue edema, or soft tissue calcifications such as from heterotopic calcification and myositis ossificans; recommend clinical correlation and consider dedicated LEFT femoral radiographs.   Single focus of nonspecific increased tracer localization at the lateral LEFT orbit.     06/01/2017 Imaging   06/01/2017 Bone Duarte IMPRESSION: No definite scintigraphic evidence of osseous metastatic disease.   Posttraumatic and postsurgical uptake at the LEFT knee.   Nonspecific soft tissue distribution of tracer at the LEFT thigh, could represent contusion, hemorrhage, soft tissue edema, or soft tissue calcifications such as from heterotopic calcification and myositis ossificans; recommend clinical correlation and consider dedicated LEFT femoral radiographs.   Single focus of nonspecific increased tracer localization at the lateral LEFT orbit.   06/09/2017 -  Chemotherapy   ddAC every 2 weeks for 4 weeks starting 06/09/17-07/21/17 followed by weekly cNorma Fredricksonand taxol for 12 weeks 08/04/17-10/20/17.    10/24/2017 Imaging  Breast MRI B/l 10/24/17 IMPRESSION: 1. No residual enhancement in the LEFT breast following  neoadjuvant treatment. 2. Significantly smaller LEFT axillary lymph nodes, largest now measuring 1.4 centimeters. The remainder of LEFT axillary lymph nodes demonstrate normal fatty hila. RECOMMENDATION: Treatment plan for known LEFT breast cancer.   11/20/2017 Cancer Staging   Staging form: Breast, AJCC 8th Edition - Pathologic stage from 11/20/2017: No Stage Recommended (ypT0, pN61m, cM0, GX, ER-, PR-, HER2-) - Signed by Amanda Merle MD on 12/10/2017    11/20/2017 Surgery   Left mastectomy and SLN biopsy by Dr. IDalbert Batman   11/20/2017 Pathology Results   Breast, modified radical mastectomy , left - MICROSCOPIC FOCI OF RESIDUAL METASTATIC CARCINOMA INVOLVING TWO OF EIGHT LYMPH NODES (2/8); LARGEST CONTINUOUS FOCUS MEASURES 0.1 CM. - NO EVIDENCE OF RESIDUAL CARCINOMA IN THE MASTECTOMY SPECIMEN - MARKED THERAPY-RELATED CHANGES, INCLUDING DENSE HYALIN FIBROSIS     11/20/2017 Receptors her2   ER- PR- HER2- (IHC 1+)   01/02/2018 - 02/14/2018 Radiation Therapy   Adjuvant Radiation 01/02/18 - 02/14/18   07/31/2018 Imaging   Baseline DEXA 07/31/18  ASSESSMENT: The BMD measured at [facility] Spine L1-L4 is 0.973 g/cm2 with a T-score of -1.7.   This patient is considered OSTEOPENIC according to [facility] criteria. The Duarte quality is good.   Site Region Measured Date Measured Age YA T-score BMD Significant CHANGE   AP Spine  L1-L4      07/31/2018    63.1         -1.7    0.973 g/cm2   DualFemur Neck Left  07/31/2018    63.1         -1.5    0.828 g/cm2   DualFemur Total Mean 07/31/2018    63.1         0.1     1.016 g/cm2   11/05/2018 Survivorship   Per Amanda Rue NP       CURRENT THERAPY: Surveillance  INTERVAL HISTORY: CBob [place]66y.o. female returns to the clinic today for follow-up visit.  The patient was last seen in clinic on 09/07/2020.  The patient underwent a mammogram on 02/16/2021.  It was felt that there may possibly be a right axillary  breast mass at this time and further evaluation with ultrasound was recommended.  An ultrasound was ordered but does not look like it was performed at this time.   Regarding the axillary region, the patient reports ***.  She denies any other nipple discharge, skin changes, or masses.  Denies any abdominal pain, jaundice, or itching.  Denies any shortness of breath, cough, chest pain, or hemoptysis.  Her last DEXA Duarte was ***.  She is here today for evaluation and a 639-monthollow-up visit.    MEDICAL HISTORY: Past Medical History:  Diagnosis Date   Cancer ([facility]/2019   left breast cancer   Diabetes mellitus without complication ([facility]   Hyperlipidemia    Hypertension    Malignant neoplasm of left breast ([facility]   Stroke (cerebrum) ([facility])    Tibial plateau fracture, left    04-13-17 had ORIF    ALLERGIES:  is allergic to victoza [liraglutide].  MEDICATIONS:  Current Outpatient Medications  Medication Sig Dispense Refill   Accu-Chek Softclix Lancets lancets Use to check blood sugar 3 times daily. E11.65 100 each 3   atorvastatin (LIPITOR) 40 MG tablet TAKE 1 TABLET BY MOUTH ONCE A DAY 90 tablet 2   Blood Glucose Monitoring Suppl (ACCU-CHEK GUIDE) w/Device  KIT use kit to check blood glucose three times daily 1 kit 0   Blood Pressure Monitor DEVI Please provide patient with insurance approved blood pressure monitor 1 each 0   citalopram (CELEXA) 20 MG tablet TAKE 1 TABLET BY MOUTH ONCE A DAY 90 tablet 3   fenofibrate (TRICOR) 145 MG tablet TAKE 1 TABLET BY MOUTH ONCE A DAY 90 tablet 2   glimepiride (AMARYL) 4 MG tablet Take 1 tablet (4 mg total) by mouth in the morning and at bedtime. 180 tablet 1   glucose blood (ACCU-CHEK GUIDE) test strip Use to check blood sugar 3 times daily. E11.65 100 each 11   hydrochlorothiazide (HYDRODIURIL) 25 MG tablet TAKE 1 TABLET (25 MG TOTAL) BY MOUTH DAILY. 90 tablet 0   hydrOXYzine (ATARAX) 10 MG tablet Take 1 tablet (10 mg total) by mouth 3 (three)  times daily as needed for anxiety. 60 tablet 3   insulin glargine (LANTUS) 100 UNIT/ML Solostar Pen Inject 20 Units into the skin daily. 18 mL 1   insulin lispro (HUMALOG) 100 UNIT/ML injection Inject 0.04 mLs (4 Units total) into the skin 3 (three) times daily before meals. 10 mL 11   Insulin Pen Needle 31G X 5 MM MISC USE AS INSTRUCTED. INJECT INTO THE SKIN ONCE NIGHTLY. 100 each 6   Insulin Syringe-Needle U-100 30G X 5/16" 1 ML MISC Use as directed to inject into the skin 3 times daily. 200 each 6   losartan (COZAAR) 50 MG tablet TAKE 1 TABLET BY MOUTH ONCE A DAY 90 tablet 0   Na Sulfate-K Sulfate-Mg Sulf (SUPREP BOWEL PREP KIT) 17.5-3.13-1.6 GM/177ML SOLN Take 1 kit by mouth as directed. For colonoscopy prep 354 mL 0   potassium chloride SA (KLOR-CON) 20 MEQ tablet Take 1 tablet (20 mEq total) by mouth daily for 5 days. 5 tablet 0   sitaGLIPtin-metformin (JANUMET) 50-1000 MG tablet TAKE 1 TABLET BY MOUTH 2 TIMES DAILY 180 tablet 1   triamcinolone cream (KENALOG) 0.1 % Apply 1 application topically 2 (two) times daily. 80 g 1   Current Facility-Administered Medications  Medication Dose Route Frequency Provider Last Rate Last Admin   0.9 %  sodium chloride infusion  500 mL Intravenous Once [place], MD        SURGICAL HISTORY:  Past Surgical History:  Procedure Laterality Date   MASTECTOMY Left 2019   MASTECTOMY MODIFIED RADICAL Left 11/20/2017   Procedure: LEFT MODIFIED RADICAL MASTECTOMY;  Surgeon: Fanny Skates, MD;  Location: [facility];  Service: General;  Laterality: Left;   ORIF TIBIA PLATEAU Left 04/13/2017   Procedure: OPEN REDUCTION INTERNAL FIXATION (ORIF) TIBIAL PLATEAU;  Surgeon: Shona Needles, MD;  Location: [facility];  Service: Orthopedics;  Laterality: Left;   PORT-A-CATH REMOVAL Right 11/20/2017   Procedure: REMOVAL PORT-A-CATH;  Surgeon: Fanny Skates, MD;  Location: [facility];  Service: General;  Laterality: Right;   PORTACATH PLACEMENT Right 05/31/2017   Procedure:  INSERTION PORT-A-CATH;  Surgeon: Fanny Skates, MD;  Location: [facility];  Service: General;  Laterality: Right;    REVIEW OF SYSTEMS:   Review of Systems  Constitutional: Negative for appetite change, chills, fatigue, fever and unexpected weight change.  HENT:   Negative for mouth sores, nosebleeds, sore throat and trouble swallowing.   Eyes: Negative for eye problems and icterus.  Respiratory: Negative for cough, hemoptysis, shortness of breath and wheezing.   Cardiovascular: Negative for chest pain and leg swelling.  Gastrointestinal: Negative for abdominal pain, constipation, diarrhea, nausea  and vomiting.  Genitourinary: Negative for bladder incontinence, difficulty urinating, dysuria, frequency and hematuria.   Musculoskeletal: Negative for back pain, gait problem, neck pain and neck stiffness.  Skin: Negative for itching and rash.  Neurological: Negative for dizziness, extremity weakness, gait problem, headaches, light-headedness and seizures.  Hematological: Negative for adenopathy. Does not bruise/bleed easily.  Psychiatric/Behavioral: Negative for confusion, depression and sleep disturbance. The patient is not nervous/anxious.     PHYSICAL EXAMINATION:  There were no vitals taken for this visit.  ECOG PERFORMANCE STATUS: {CHL ONC ECOG [id number]  Physical Exam  Constitutional: Oriented to person, place, and time and well-developed, well-nourished, and in no distress. No distress.  HENT:  Head: Normocephalic and atraumatic.  Mouth/Throat: Oropharynx is clear and moist. No oropharyngeal exudate.  Eyes: Conjunctivae are normal. Right eye exhibits no discharge. Left eye exhibits no discharge. No scleral icterus.  Neck: Normal range of motion. Neck supple.  Cardiovascular: Normal rate, regular rhythm, normal heart sounds and intact distal pulses.   Pulmonary/Chest: Effort normal and breath sounds normal. No respiratory distress. No wheezes. No rales.   Abdominal: Soft. Bowel sounds are normal. Exhibits no distension and no mass. There is no tenderness.  Musculoskeletal: Normal range of motion. Exhibits no edema.  Lymphadenopathy:    No cervical adenopathy.  Neurological: Alert and oriented to person, place, and time. Exhibits normal muscle tone. Gait normal. Coordination normal.  Skin: Skin is warm and dry. No rash noted. Not diaphoretic. No erythema. No pallor.  Psychiatric: Mood, memory and judgment normal.  Vitals reviewed.  LABORATORY DATA: Lab Results  Component Value Date   WBC 5.5 02/15/2021   HGB 13.9 02/15/2021   HCT 40.4 02/15/2021   MCV 97 02/15/2021   PLT 150 02/15/2021      Chemistry      Component Value Date/Time   NA 133 (L) 02/15/2021 1233   K 3.9 02/15/2021 1233   CL 93 (L) 02/15/2021 1233   CO2 27 02/15/2021 1233   BUN 16 02/15/2021 1233   CREATININE 0.74 02/15/2021 1233   CREATININE 0.77 09/07/2020 1333      Component Value Date/Time   CALCIUM 9.7 02/15/2021 1233   ALKPHOS 111 02/15/2021 1233   AST 20 02/15/2021 1233   AST 26 09/07/2020 1333   ALT 17 02/15/2021 1233   ALT 29 09/07/2020 1333   BILITOT 0.6 02/15/2021 1233   BILITOT 0.4 09/07/2020 1333       RADIOGRAPHIC STUDIES:  No results found.   ASSESSMENT/PLAN:  Amanda Duarte is a 66 y.o. female with    1. 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 She is s/p neoadjuvant ddAC-TC, left mastectomy and adjuvant radiation.  -Due to her excellent response to neoadjuvant chemotherapy, and minimal residual disease, Dr. FBurr Medicodid not recommend more adjuvant chemotherapy such as Xeloda  -Given her triple negative disease and lymph node involvement, she is at risk for breast cancer recurrence. She is on breast cancer surveillance.  -Last seen by surgeon Dr. CBrantley Stagein 01/2020, f/u  annually -Most recent right screening mammogram on 02/16/21. A possible right axillary mass was noted. A ultrasound was recommended and ordered but unable to reach patient to schedule.  -We called over to GI breast center today and scheduled an appointment for her.  -F/U?     2. Mild anemia -Hgb noted to be *** today (***) -last colonoscopy  08/21/19 with Dr. Tarri Glenn was negative (aside from possible prior infection), recall in 10 years -[name] previously recommended she take a multivitamin and increase her protein intake. She notes she does not eat meat any more. -[name] previously advised her to call if she develops any symptoms, such as increasing fatigue or bleeding.   3. Chronic thoracic spine pain -MRI 11/14/18 showed normal cord, no osseous metastasis  -[name] previously advised her to take OTC medications as needed   4. HTN and DM  -Continue follow-up with primary care physician  -on Lipitor, Losartan and Janumet, glimepiride -Her HTN is controlled and her DM uncontrolled. BG up to *** today (***) -She will continue to monitor with her PCP.    5. Osteopenia  -Baseline 07/2018 DEXA show Lowest T-score at -1.7 at AP Spine. No significant risk for Fx   6. 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  -did not mention neuropathy symptoms today (***)      PLAN:   -Arrange breast ultrasound and biopsy    No orders of the defined types were placed in this encounter.    I spent {CHL ONC TIME VISIT - HGDJM:[id number]} counseling the patient face to face. The total time spent in the appointment was {CHL ONC TIME VISIT - IWLNL:[id number]}.  Winifred Bodiford L Titus Drone, PA-C 03/24/21

## 2021-03-25 ENCOUNTER — Other Ambulatory Visit: Payer: Medicare Other

## 2021-03-25 ENCOUNTER — Inpatient Hospital Stay: Payer: Medicare Other | Attending: Hematology

## 2021-03-25 ENCOUNTER — Ambulatory Visit: Payer: Medicare Other | Admitting: Hematology

## 2021-03-25 ENCOUNTER — Inpatient Hospital Stay: Payer: Medicare Other | Admitting: Physician Assistant

## 2021-03-25 DIAGNOSIS — C50212 Malignant neoplasm of upper-inner quadrant of left female breast: Secondary | ICD-10-CM | POA: Insufficient documentation

## 2021-03-25 DIAGNOSIS — I10 Essential (primary) hypertension: Secondary | ICD-10-CM | POA: Insufficient documentation

## 2021-03-25 DIAGNOSIS — Z171 Estrogen receptor negative status [ER-]: Secondary | ICD-10-CM | POA: Insufficient documentation

## 2021-03-25 DIAGNOSIS — Z9012 Acquired absence of left breast and nipple: Secondary | ICD-10-CM | POA: Insufficient documentation

## 2021-03-25 DIAGNOSIS — N6331 Unspecified lump in axillary tail of the right breast: Secondary | ICD-10-CM | POA: Insufficient documentation

## 2021-03-25 DIAGNOSIS — C773 Secondary and unspecified malignant neoplasm of axilla and upper limb lymph nodes: Secondary | ICD-10-CM | POA: Insufficient documentation

## 2021-03-25 DIAGNOSIS — E119 Type 2 diabetes mellitus without complications: Secondary | ICD-10-CM | POA: Insufficient documentation

## 2021-03-25 DIAGNOSIS — G629 Polyneuropathy, unspecified: Secondary | ICD-10-CM | POA: Insufficient documentation

## 2021-03-25 DIAGNOSIS — Z794 Long term (current) use of insulin: Secondary | ICD-10-CM | POA: Insufficient documentation

## 2021-03-25 DIAGNOSIS — Z79899 Other long term (current) drug therapy: Secondary | ICD-10-CM | POA: Insufficient documentation

## 2021-04-13 NOTE — Progress Notes (Signed)
[facility] ?OFFICE PROGRESS NOTE ? ?Amanda Pounds, NP ?[address] ?[place] [address] ? ?DIAGNOSIS: f/u of left breast cancer ? ?Oncology History Overview Note  ?Cancer Staging ?Malignant neoplasm of upper-inner quadrant of left breast in female, estrogen receptor positive (Amanda Duarte) ?Staging form: Breast, AJCC 8th Edition ?- Clinical stage from 05/01/2017: Stage Unknown (cTX, cN2, cM0, G3, ER-, PR-, HER2-) - Signed by Amanda Merle, MD on 11/01/2017 ?- Pathologic stage from 11/20/2017: No Stage Recommended (ypT0, pN61m, cM0, GX, ER-, PR-, HER2-) - Signed by Amanda Merle MD on 12/10/2017 ? ? ?  ?Malignant neoplasm of upper-inner quadrant of left breast in female, estrogen receptor positive ([facility]  ?04/28/2017 Mammogram  ? IMPRESSION: ?Two adjacent masses/enlarged lymph nodes in the LOWER LEFT axilla, ?the largest measuring 2.1 cm. Tissue sampling of 1 of these is ?recommended to exclude malignancy/lymphoma. No mammographic evidence of breast malignancy bilaterally. ? ?  ?05/01/2017 Initial Biopsy  ? Diagnosis 05/01/17 ?Lymph node, needle/core biopsy, low left inferior axillary ?- METASTATIC POORLY DIFFERENTIATED CARCINOMA TO A LYMPH NODE. SEE NOTE. ?  ?05/01/2017 Cancer Staging  ? Staging form: Breast, AJCC 8th Edition ?- Clinical stage from 05/01/2017: Stage Unknown (cTX, cN1, cM0, G3, ER-, PR-, HER2-) - Signed by Amanda Merle MD on 06/02/2017 ? ?  ?05/01/2017 Receptors her2  ? Lymph Node Biopsy:  ?HER2-Negative  ?PR-Negative  ?ER- Negative  ?  ?05/10/2017 Imaging  ? MR Breast W WO Contrast 05/10/17 ?IMPRESSION: ?No MRI evidence of malignancy in the right breast. Area of week stippled non mass enhancement in the left breast upper ?inner quadrant. Separate area of thin linear non mass enhancement in the subareolar left breast. Three grossly abnormal left axillary lymph nodes, and more than 4 less than 1 cm indeterminate left axillary lymph nodes. No evidence of right axillary lymphadenopathy. ?  ?05/17/2017  Initial Biopsy  ? Diagnosis 05/17/17 ?1. Breast, left, needle core biopsy, central middle depth MR enhancement ?- DUCTAL CARCINOMA IN SITU WITH FOCI SUSPICIOUS FOR INVASION. ?2. Breast, left, needle core biopsy, upper inner post MR enhancement ?- MICROSCOPIC FOCUS OF DUCTAL CARCINOMA IN SITU. ?  ?05/17/2017 Receptors her2  ? Left Breast Biopsy:  ?ER-Negative ?PR-Negative ?HER2-Negative ?  ?05/22/2017 Initial Diagnosis  ? Malignant neoplasm of upper-inner quadrant of left breast in female, estrogen receptor positive ([facility] ?  ?06/01/2017 Imaging  ? [place] Scan 06/01/17 ?IMPRESSION: ?No definite scintigraphic evidence of osseous metastatic disease. ?  ?Posttraumatic and postsurgical uptake at the LEFT knee. ?  ?Nonspecific soft tissue distribution of tracer at the LEFT thigh, ?could represent contusion, hemorrhage, soft tissue edema, or soft ?tissue calcifications such as from heterotopic calcification and ?myositis ossificans; recommend clinical correlation and consider ?dedicated LEFT femoral radiographs. ?  ?Single focus of nonspecific increased tracer localization at the ?lateral LEFT orbit. ?  ?  ?06/01/2017 Imaging  ? 06/01/2017 Bone Scan ?IMPRESSION: ?No definite scintigraphic evidence of osseous metastatic disease. ?  ?Posttraumatic and postsurgical uptake at the LEFT knee. ?  ?Nonspecific soft tissue distribution of tracer at the LEFT thigh, ?could represent contusion, hemorrhage, soft tissue edema, or soft ?tissue calcifications such as from heterotopic calcification and ?myositis ossificans; recommend clinical correlation and consider ?dedicated LEFT femoral radiographs. ?  ?Single focus of nonspecific increased tracer localization at the ?lateral LEFT orbit. ?  ?06/09/2017 -  Chemotherapy  ? ddAC every 2 weeks for 4 weeks starting 06/09/17-07/21/17 followed by weekly cBotsw[place]and taxol for 12 weeks 08/04/17-10/20/17.  ?  ?10/24/2017 Imaging  ?  Breast MRI B/l 10/24/17 ?IMPRESSION: ?1. No residual enhancement in the LEFT breast  following neoadjuvant ?treatment. ?2. Significantly smaller LEFT axillary lymph nodes, largest now ?measuring 1.4 centimeters. The remainder of LEFT axillary lymph ?nodes demonstrate normal fatty hila. ?RECOMMENDATION: ?Treatment plan for known LEFT breast cancer. ?  ?11/20/2017 Cancer Staging  ? Staging form: Breast, AJCC 8th Edition ?- Pathologic stage from 11/20/2017: No Stage Recommended (ypT0, pN84m, cM0, GX, ER-, PR-, HER2-) - Signed by Amanda Merle MD on 12/10/2017 ? ?  ?11/20/2017 Surgery  ? Left mastectomy and SLN biopsy by Dr. IDalbert Batman ?  ?11/20/2017 Pathology Results  ? Breast, modified radical mastectomy , left ?- MICROSCOPIC FOCI OF RESIDUAL METASTATIC CARCINOMA INVOLVING TWO OF EIGHT LYMPH NODES (2/8); ?LARGEST CONTINUOUS FOCUS MEASURES 0.1 CM. ?- NO EVIDENCE OF RESIDUAL CARCINOMA IN THE MASTECTOMY SPECIMEN ?- MARKED THERAPY-RELATED CHANGES, INCLUDING DENSE HYALIN FIBROSIS ? ? ?  ?11/20/2017 Receptors her2  ? ER- ?PR- ?HER2- (IHC 1+) ?  ?01/02/2018 - 02/14/2018 Radiation Therapy  ? Adjuvant Radiation 01/02/18 - 02/14/18 ?  ?07/31/2018 Imaging  ? Baseline DEXA 07/31/18  ?ASSESSMENT: ?The BMD measured at [facility] Spine L1-L4 is 0.973 g/cm2 with a Amanda-score of ?-1.7. ?  ?This patient is considered OSTEOPENIC according to [place]?Organization (WHO) criteria. ?The scan quality is good. ?  ?Site Region Measured Date Measured Age YA Amanda-score BMD Significant ?CHANGE ?  ?AP Spine  L1-L4      07/31/2018    63.1         -1.7    0.973 g/cm2 ?  ?DualFemur Neck Left  07/31/2018    63.1         -1.5    0.828 g/cm2 ?  ?DualFemur Total Mean 07/31/2018    63.1         0.1     1.016 g/cm2 ?  ?11/05/2018 Survivorship  ? Per LCira Rue NP  ?  ? ? ? ?CURRENT THERAPY: Surveillance ? ?INTERVAL HISTORY: ?CTerence Googe89y.o. female returns to the clinic today for a follow-up visit accompanied by the interpretor.  The patient was last seen in clinic on 09/07/2020.  The patient underwent a mammogram on 02/16/2021.  It was felt that  there may possibly be a RIGHT axillary breast mass at this time and further evaluation with ultrasound was recommended.  An ultrasound was ordered but [place] had passed [place] and she was unreachable. The ultrasound is now scheduled for 05/10/21.  ? ? ?Regarding the axillary region, the patient reports some swelling without overlying skin changes. She reports only mild pain with firm palpation. She is not sure how long this has been there for.  She denies any other nipple discharge, skin changes, or masses. She denies any changes with the left mastectomy site or any right breast lumps. Denies any abdominal pain or jaundice. Denies any new bone pain. [place] states she saw a provider who felt the itching/rash was due to contaminated river water but she states she was treated and the rash improved. The rash incorporated majority of her body. Denies any shortness of breath, cough, chest pain, or hemoptysis.  ? ?Her blood sugar is elevated today. She has not been taking her diabetic medications [place] She has the capacity to check her blood sugar at home. She is here today for evaluation and a 658-monthollow-up visit. ? ? ? ? ?MEDICAL  HISTORY: ?Past Medical History:  ?Diagnosis Date  ? Cancer ([place]) 03/2017  ? left breast cancer  ? Diabetes mellitus without complication ([place])   ? Hyperlipidemia   ? Hypertension   ? Malignant neoplasm of left breast ([place] [facility])   ? Stroke (cerebrum) ([facility])   ? Tibial plateau fracture, left   ? 04-13-17 had ORIF  ? ? ?ALLERGIES:  is allergic to victoza [liraglutide]. ? ?MEDICATIONS:  ?Current Outpatient Medications  ?Medication Sig Dispense Refill  ? Accu-Chek Softclix Lancets lancets Use to check blood sugar 3 times daily. E11.65 100 each 3  ? atorvastatin (LIPITOR) 40 MG tablet TAKE 1 TABLET BY MOUTH ONCE A DAY 90 tablet 2  ? Blood Glucose Monitoring Suppl (ACCU-CHEK GUIDE) w/Device  KIT use kit to check blood glucose three times daily 1 kit 0  ? Blood Pressure Monitor DEVI Please provide patient with insurance approved blood pressure monitor 1 each 0  ? citalopram (CELEXA) 20 MG tablet Amanda

## 2021-04-16 ENCOUNTER — Inpatient Hospital Stay (HOSPITAL_BASED_OUTPATIENT_CLINIC_OR_DEPARTMENT_OTHER): Payer: Medicare Other

## 2021-04-16 ENCOUNTER — Other Ambulatory Visit: Payer: Self-pay

## 2021-04-16 ENCOUNTER — Inpatient Hospital Stay (HOSPITAL_BASED_OUTPATIENT_CLINIC_OR_DEPARTMENT_OTHER): Payer: Medicare Other | Admitting: Physician Assistant

## 2021-04-16 VITALS — BP 121/58 | HR 67 | Temp 97.7°F | Resp 18 | Wt 124.8 lb

## 2021-04-16 DIAGNOSIS — R2230 Localized swelling, mass and lump, unspecified upper limb: Secondary | ICD-10-CM | POA: Insufficient documentation

## 2021-04-16 DIAGNOSIS — Z79899 Other long term (current) drug therapy: Secondary | ICD-10-CM | POA: Diagnosis not present

## 2021-04-16 DIAGNOSIS — R2231 Localized swelling, mass and lump, right upper limb: Secondary | ICD-10-CM | POA: Diagnosis not present

## 2021-04-16 DIAGNOSIS — C773 Secondary and unspecified malignant neoplasm of axilla and upper limb lymph nodes: Secondary | ICD-10-CM | POA: Diagnosis not present

## 2021-04-16 DIAGNOSIS — I10 Essential (primary) hypertension: Secondary | ICD-10-CM | POA: Diagnosis not present

## 2021-04-16 DIAGNOSIS — N6331 Unspecified lump in axillary tail of the right breast: Secondary | ICD-10-CM | POA: Diagnosis not present

## 2021-04-16 DIAGNOSIS — C50212 Malignant neoplasm of upper-inner quadrant of left female breast: Secondary | ICD-10-CM | POA: Diagnosis present

## 2021-04-16 DIAGNOSIS — E119 Type 2 diabetes mellitus without complications: Secondary | ICD-10-CM | POA: Diagnosis not present

## 2021-04-16 DIAGNOSIS — Z171 Estrogen receptor negative status [ER-]: Secondary | ICD-10-CM | POA: Diagnosis not present

## 2021-04-16 DIAGNOSIS — G629 Polyneuropathy, unspecified: Secondary | ICD-10-CM | POA: Diagnosis not present

## 2021-04-16 DIAGNOSIS — Z170 Estrogen receptor positive status [ER+]: Secondary | ICD-10-CM | POA: Diagnosis not present

## 2021-04-16 DIAGNOSIS — Z9012 Acquired absence of left breast and nipple: Secondary | ICD-10-CM | POA: Diagnosis not present

## 2021-04-16 DIAGNOSIS — Z794 Long term (current) use of insulin: Secondary | ICD-10-CM | POA: Diagnosis not present

## 2021-04-16 LAB — CMP (CANCER CENTER ONLY)
ALT: 19 U/L (ref 0–44)
AST: 25 U/L (ref 15–41)
Albumin: 4.1 g/dL (ref 3.5–5.0)
Alkaline Phosphatase: 102 U/L (ref 38–126)
Anion gap: 9 (ref 5–15)
BUN: 13 mg/dL (ref 8–23)
CO2: 27 mmol/L (ref 22–32)
Calcium: 9.9 mg/dL (ref 8.9–10.3)
Chloride: 97 mmol/L — ABNORMAL LOW (ref 98–111)
Creatinine: 0.65 mg/dL (ref 0.44–1.00)
GFR, Estimated: >60 mL/min (ref >60)
Glucose, Bld: 364 mg/dL — ABNORMAL HIGH (ref 70–99)
Potassium: 3.8 mmol/L (ref 3.5–5.1)
Sodium: 133 mmol/L — ABNORMAL LOW (ref 135–145)
Total Bilirubin: 0.5 mg/dL (ref 0.3–1.2)
Total Protein: 8.3 g/dL — ABNORMAL HIGH (ref 6.5–8.1)

## 2021-04-16 LAB — CBC WITH DIFFERENTIAL (CANCER CENTER ONLY)
Abs Immature Granulocytes: 0.01 10*3/uL (ref 0.00–0.07)
Basophils Absolute: 0 10*3/uL (ref 0.0–0.1)
Basophils Relative: 1 %
Eosinophils Absolute: 0.5 10*3/uL (ref 0.0–0.5)
Eosinophils Relative: 8 %
HCT: 37.5 % (ref 36.0–46.0)
Hemoglobin: 12.8 g/dL (ref 12.0–15.0)
Immature Granulocytes: 0 %
Lymphocytes Relative: 34 %
Lymphs Abs: 2.1 10*3/uL (ref 0.7–4.0)
MCH: 32.6 pg (ref 26.0–34.0)
MCHC: 34.1 g/dL (ref 30.0–36.0)
MCV: 95.4 fL (ref 80.0–100.0)
Monocytes Absolute: 0.4 10*3/uL (ref 0.1–1.0)
Monocytes Relative: 7 %
Neutro Abs: 3.2 10*3/uL (ref 1.7–7.7)
Neutrophils Relative %: 50 %
Platelet Count: 163 10*3/uL (ref 150–400)
RBC: 3.93 MIL/uL (ref 3.87–5.11)
RDW: 13.3 % (ref 11.5–15.5)
WBC Count: 6.3 10*3/uL (ref 4.0–10.5)
nRBC: 0 % (ref 0.0–0.2)

## 2021-04-21 ENCOUNTER — Other Ambulatory Visit: Payer: Self-pay | Admitting: Nurse Practitioner

## 2021-04-21 ENCOUNTER — Ambulatory Visit
Admission: RE | Admit: 2021-04-21 | Discharge: 2021-04-21 | Disposition: A | Discharge status: Home / Self Care | Payer: Medicare Other | Source: Ambulatory Visit | Attending: Nurse Practitioner | Admitting: Nurse Practitioner

## 2021-04-21 DIAGNOSIS — R928 Other abnormal and inconclusive findings on diagnostic imaging of breast: Secondary | ICD-10-CM

## 2021-04-21 DIAGNOSIS — R2231 Localized swelling, mass and lump, right upper limb: Secondary | ICD-10-CM

## 2021-05-07 ENCOUNTER — Telehealth: Payer: Self-pay | Admitting: Hematology

## 2021-05-07 ENCOUNTER — Inpatient Hospital Stay: Payer: Medicare Other | Admitting: Hematology

## 2021-05-07 ENCOUNTER — Inpatient Hospital Stay: Payer: Medicare Other

## 2021-05-07 DIAGNOSIS — Z170 Estrogen receptor positive status [ER+]: Secondary | ICD-10-CM

## 2021-05-07 NOTE — Telephone Encounter (Signed)
Appt cancelled per 4/14 secure chat(Dr.Feng), called interpreter Almyra Free to schedule, pt aware ?

## 2021-05-10 ENCOUNTER — Other Ambulatory Visit: Payer: Medicare Other

## 2021-05-21 ENCOUNTER — Encounter: Payer: Self-pay | Admitting: Hematology

## 2021-05-21 ENCOUNTER — Inpatient Hospital Stay (HOSPITAL_BASED_OUTPATIENT_CLINIC_OR_DEPARTMENT_OTHER): Payer: Medicare Other | Admitting: Hematology

## 2021-05-21 ENCOUNTER — Inpatient Hospital Stay: Payer: Medicare Other | Attending: Physician Assistant

## 2021-05-21 ENCOUNTER — Other Ambulatory Visit: Payer: Self-pay

## 2021-05-21 VITALS — BP 129/55 | HR 63 | Temp 98.2°F | Resp 18 | Wt 123.4 lb

## 2021-05-21 DIAGNOSIS — Z79899 Other long term (current) drug therapy: Secondary | ICD-10-CM | POA: Diagnosis not present

## 2021-05-21 DIAGNOSIS — Z853 Personal history of malignant neoplasm of breast: Secondary | ICD-10-CM | POA: Diagnosis present

## 2021-05-21 DIAGNOSIS — C50212 Malignant neoplasm of upper-inner quadrant of left female breast: Secondary | ICD-10-CM

## 2021-05-21 DIAGNOSIS — Z170 Estrogen receptor positive status [ER+]: Secondary | ICD-10-CM

## 2021-05-21 DIAGNOSIS — R222 Localized swelling, mass and lump, trunk: Secondary | ICD-10-CM | POA: Insufficient documentation

## 2021-05-21 DIAGNOSIS — Z7984 Long term (current) use of oral hypoglycemic drugs: Secondary | ICD-10-CM | POA: Insufficient documentation

## 2021-05-21 DIAGNOSIS — I10 Essential (primary) hypertension: Secondary | ICD-10-CM | POA: Insufficient documentation

## 2021-05-21 DIAGNOSIS — M8580 Other specified disorders of bone density and structure, unspecified site: Secondary | ICD-10-CM | POA: Insufficient documentation

## 2021-05-21 DIAGNOSIS — Z923 Personal history of irradiation: Secondary | ICD-10-CM | POA: Diagnosis not present

## 2021-05-21 DIAGNOSIS — Z9012 Acquired absence of left breast and nipple: Secondary | ICD-10-CM | POA: Diagnosis not present

## 2021-05-21 DIAGNOSIS — Z9221 Personal history of antineoplastic chemotherapy: Secondary | ICD-10-CM | POA: Insufficient documentation

## 2021-05-21 DIAGNOSIS — R21 Rash and other nonspecific skin eruption: Secondary | ICD-10-CM | POA: Insufficient documentation

## 2021-05-21 DIAGNOSIS — E876 Hypokalemia: Secondary | ICD-10-CM | POA: Diagnosis not present

## 2021-05-21 DIAGNOSIS — E1165 Type 2 diabetes mellitus with hyperglycemia: Secondary | ICD-10-CM | POA: Diagnosis not present

## 2021-05-21 LAB — CBC WITH DIFFERENTIAL (CANCER CENTER ONLY)
Abs Immature Granulocytes: 0.01 10*3/uL (ref 0.00–0.07)
Basophils Absolute: 0 10*3/uL (ref 0.0–0.1)
Basophils Relative: 1 %
Eosinophils Absolute: 0.5 10*3/uL (ref 0.0–0.5)
Eosinophils Relative: 10 %
HCT: 37 % (ref 36.0–46.0)
Hemoglobin: 12.9 g/dL (ref 12.0–15.0)
Immature Granulocytes: 0 %
Lymphocytes Relative: 42 %
Lymphs Abs: 2 10*3/uL (ref 0.7–4.0)
MCH: 32.3 pg (ref 26.0–34.0)
MCHC: 34.9 g/dL (ref 30.0–36.0)
MCV: 92.7 fL (ref 80.0–100.0)
Monocytes Absolute: 0.3 10*3/uL (ref 0.1–1.0)
Monocytes Relative: 7 %
Neutro Abs: 1.8 10*3/uL (ref 1.7–7.7)
Neutrophils Relative %: 40 %
Platelet Count: 153 10*3/uL (ref 150–400)
RBC: 3.99 MIL/uL (ref 3.87–5.11)
RDW: 12 % (ref 11.5–15.5)
WBC Count: 4.6 10*3/uL (ref 4.0–10.5)
nRBC: 0 % (ref 0.0–0.2)

## 2021-05-21 LAB — CMP (CANCER CENTER ONLY)
ALT: 18 U/L (ref 0–44)
AST: 25 U/L (ref 15–41)
Albumin: 4.1 g/dL (ref 3.5–5.0)
Alkaline Phosphatase: 133 U/L — ABNORMAL HIGH (ref 38–126)
Anion gap: 7 (ref 5–15)
BUN: 14 mg/dL (ref 8–23)
CO2: 30 mmol/L (ref 22–32)
Calcium: 9.3 mg/dL (ref 8.9–10.3)
Chloride: 95 mmol/L — ABNORMAL LOW (ref 98–111)
Creatinine: 0.75 mg/dL (ref 0.44–1.00)
GFR, Estimated: >60 mL/min (ref >60)
Glucose, Bld: 436 mg/dL — ABNORMAL HIGH (ref 70–99)
Potassium: 3.4 mmol/L — ABNORMAL LOW (ref 3.5–5.1)
Sodium: 132 mmol/L — ABNORMAL LOW (ref 135–145)
Total Bilirubin: 0.5 mg/dL (ref 0.3–1.2)
Total Protein: 8.3 g/dL — ABNORMAL HIGH (ref 6.5–8.1)

## 2021-05-21 NOTE — Progress Notes (Signed)
?[place]   ?[phone] [phone]   ?Clinic Follow up Note  ? ?Patient Care Team: ?[name], NP as PCP - General (Nurse Practitioner) ?[name], MD as Consulting Physician (Hematology) ?Fanny Skates, MD as Consulting Physician (General Surgery) ?Alla Feeling, NP as Nurse Practitioner (Nurse Practitioner) ?Gery Pray, MD as Consulting Physician (Radiation Oncology) ? ?Date of Service:  05/21/2021 ? ?CHIEF COMPLAINT: f/u of left breast cancer ? ?CURRENT THERAPY:  ?Surveillance ? ?ASSESSMENT & PLAN:  ?Amanda Duarte is a 66 y.o. post-menopausal female with  ? ?1. 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  ?-Due to her excellent response to neoadjuvant chemotherapy, adjuvant chemotherapy was not recommended ?-Given her triple negative disease and lymph node involvement, she is at risk for breast cancer recurrence. She is on breast cancer surveillance.  ?-she is clinically doing well. Labs reviewed, CBC is WNL, CMP shows slightly low potassium. She asked about multivitamins today, which I recommend. Physical exam was unremarkable. There is no clinical concern for recurrence. ?-continue surveillance, proceed to mammogram as scheduled in July. ?-f/u in 6 months ?  ?2. Right Axillary Mass ?-02/16/21 mammogram showed mass in the right axilla ?-f/u [place] 04/21/21 showed a mildly prominent node in right axilla, which is normal by [place] and favored reactive. ?-she is scheduled for short-term f/u right breast MM and right axilla [place] 07/26/21. ?-axillary exam today was negative  ?  ?3. HTN and DM  ?-Continue follow-up with primary care physician  ?-on Lipitor, Losartan and Janumet, glimepiride ?-Her HTN is controlled and her DM uncontrolled. BG up to 436 today (05/21/21). She tells me she has  not been taking her medications and delayed her last appointment because of her son's illness. ?-she has some skin rashes on exam today. I advised her to restart her medications when she gets home today. I also recommend she drink a lot of water. ?-I advised her to rescheduled f/u with her PCP.  ?  ?4. Osteopenia  ?-Baseline 07/2018 DEXA show Lowest T-score at -1.7 at [facility]. No significant risk for Fx ?  ?  ?PLAN:   ?-MM and [place] 07/26/21 ?-lab and f/u in 6 months ? ? ?No problem-specific Assessment & Plan notes found for this encounter. ? ? ?SUMMARY OF ONCOLOGIC HISTORY: ?Oncology History Overview Note  ?Cancer Staging ?Malignant neoplasm of upper-inner quadrant of left breast in female, [place] receptor positive [place] ?Staging form: Breast, AJCC 8th Edition ?- Clinical stage from 05/01/2017: Stage Unknown (cTX, cN2, cM0, G3, ER-, PR-, HER2-) - Signed by [name] on 11/01/2017 ?- Pathologic stage from 11/20/2017: No Stage Recommended (ypT0, pN157m cM0, GX, ER-, PR-, HER2-) - Signed by FeTruitt MerleMD on 12/10/2017 ? ? ?  ?Malignant neoplasm of upper-inner quadrant of left breast in female, [place] positive ([place] ?04/28/2017 Mammogram  ? IMPRESSION: ?Two adjacent masses/enlarged lymph nodes in the LOWER LEFT axilla, ?the largest measuring 2.1 cm. Tissue sampling of 1 of these is ?recommended to exclude malignancy/lymphoma. No mammographic evidence of breast malignancy bilaterally. ? ?  ?05/01/2017 Initial Biopsy  ? Diagnosis 05/01/17 ?Lymph node, needle/core biopsy, low left inferior axillary ?- METASTATIC POORLY DIFFERENTIATED CARCINOMA TO A LYMPH NODE. SEE NOTE. ? ?  ?05/01/2017 Cancer Staging  ? Staging form: Breast, AJCC 8th Edition ?- Clinical stage from 05/01/2017: Stage  Unknown (cTX, cN1, cM0, G3, ER-, PR-, HER2-) - Signed by [name], MD on 06/02/2017 ? ?  ?05/01/2017 Receptors her2  ? Lymph Node Biopsy:  ?HER2-Negative  ?PR-Negative  ?ER- Negative  ? ?  ?05/10/2017 Imaging  ? MR Breast W WO Contrast  05/10/17 ?IMPRESSION: ?No MRI evidence of malignancy in the right breast. Area of week stippled non mass enhancement in the left breast upper ?inner quadrant. Separate area of thin linear non mass enhancement in the subareolar left breast. Three grossly abnormal left axillary lymph nodes, and more than 4 less than 1 cm indeterminate left axillary lymph nodes. No evidence of right axillary lymphadenopathy. ?  ?05/17/2017 Initial Biopsy  ? Diagnosis 05/17/17 ?1. Breast, left, needle core biopsy, central middle depth MR enhancement ?- DUCTAL CARCINOMA IN SITU WITH FOCI SUSPICIOUS FOR INVASION. ?2. Breast, left, needle core biopsy, upper inner post MR enhancement ?- MICROSCOPIC FOCUS OF DUCTAL CARCINOMA IN SITU. ? ?  ?05/17/2017 Receptors her2  ? Left Breast Biopsy:  ?ER-Negative ?PR-Negative ?HER2-Negative ? ?  ?05/22/2017 Initial Diagnosis  ? Malignant neoplasm of upper-inner quadrant of left breast in female, estrogen receptor positive ([place]) ? ?  ?06/01/2017 Imaging  ? Boen Scan 06/01/17 ?IMPRESSION: ?No definite scintigraphic evidence of osseous metastatic disease. ?  ?Posttraumatic and postsurgical uptake at the LEFT knee. ?  ?Nonspecific soft tissue distribution of tracer at the LEFT thigh, ?could represent contusion, hemorrhage, soft tissue edema, or soft ?tissue calcifications such as from heterotopic calcification and ?myositis ossificans; recommend clinical correlation and consider ?dedicated LEFT femoral radiographs. ?  ?Single focus of nonspecific increased tracer localization at the ?lateral LEFT orbit. ?  ? ?  ?06/01/2017 Imaging  ? 06/01/2017 Bone Scan ?IMPRESSION: ?No definite scintigraphic evidence of osseous metastatic disease. ?  ?Posttraumatic and postsurgical uptake at the LEFT knee. ?  ?Nonspecific soft tissue distribution of tracer at the LEFT thigh, ?could represent contusion, hemorrhage, soft tissue edema, or soft ?tissue calcifications such as from heterotopic calcification and ?myositis ossificans;  recommend clinical correlation and consider ?dedicated LEFT femoral radiographs. ?  ?Single focus of nonspecific increased tracer localization at the ?lateral LEFT orbit. ?  ?06/09/2017 -  Chemotherapy  ? ddAC every 2 weeks for 4 weeks starting 06/09/17-07/21/17 followed by weekly [place] and taxol for 12 weeks 08/04/17-10/20/17.  ? ?  ?10/24/2017 Imaging  ? Breast MRI B/l 10/24/17 ?IMPRESSION: ?1. No residual enhancement in the LEFT breast following neoadjuvant ?treatment. ?2. Significantly smaller LEFT axillary lymph nodes, largest now ?measuring 1.4 centimeters. The remainder of LEFT axillary lymph ?nodes demonstrate normal fatty hila. ?RECOMMENDATION: ?Treatment plan for known LEFT breast cancer. ?  ?11/20/2017 Cancer Staging  ? Staging form: Breast, AJCC 8th Edition ?- Pathologic stage from 11/20/2017: No Stage Recommended (ypT0, pN4m, cM0, GX, ER-, PR-, HER2-) - Signed by [name] on 12/10/2017 ? ?  ?11/20/2017 Surgery  ? Left mastectomy and SLN biopsy by Dr. IDalbert Batman ? ?  ?11/20/2017 Pathology Results  ? Breast, modified radical mastectomy , left ?- MICROSCOPIC FOCI OF RESIDUAL METASTATIC CARCINOMA INVOLVING TWO OF EIGHT LYMPH NODES (2/8); ?LARGEST CONTINUOUS FOCUS MEASURES 0.1 CM. ?- NO EVIDENCE OF RESIDUAL CARCINOMA IN THE MASTECTOMY SPECIMEN ?- MARKED THERAPY-RELATED CHANGES, INCLUDING DENSE HYALIN FIBROSIS ? ? ? ?  ?11/20/2017 Receptors her2  ? ER- ?PR- ?HER2- (IHC 1+) ? ?  ?01/02/2018 - 02/14/2018 Radiation Therapy  ? Adjuvant Radiation 01/02/18 - 02/14/18 ?  ?07/31/2018 Imaging  ? Baseline DEXA 07/31/18  ?ASSESSMENT: ?The BMD measured at [facility]  L1-L4 is 0.973 g/cm2 with a T-score of ?-1.7. ?  ?This patient is considered OSTEOPENIC according to [place] ?Organization (WHO) criteria. ?The scan quality is good. ?  ?Site Region Measured Date Measured Age YA T-score BMD Significant ?CHANGE ?  ?[facility] Spine  L1-L4      07/31/2018    63.1         -1.7    0.973 g/cm2 ?  ?DualFemur Neck Left  07/31/2018    63.1          -1.5    0.828 g/cm2 ?  ?DualFemur Total Mean 07/31/2018    63.1         0.1     1.016 g/cm2 ?  ?11/05/2018 Survivorship  ? Per [name], NP  ?  ? ? ? ?INTERVAL HISTORY:  ?ADIE VILAR is here for a follow up of

## 2021-05-24 ENCOUNTER — Other Ambulatory Visit: Payer: Self-pay

## 2021-05-24 ENCOUNTER — Other Ambulatory Visit: Payer: Self-pay | Admitting: Nurse Practitioner

## 2021-05-24 DIAGNOSIS — I10 Essential (primary) hypertension: Secondary | ICD-10-CM

## 2021-05-25 ENCOUNTER — Other Ambulatory Visit: Payer: Self-pay

## 2021-05-25 MED ORDER — HYDROCHLOROTHIAZIDE 25 MG PO TABS
25.0000 mg | ORAL_TABLET | Freq: Every day | ORAL | 0 refills | Status: DC
  Filled 2021-05-25: qty 30, 30d supply, fill #0

## 2021-05-25 MED ORDER — LOSARTAN POTASSIUM 50 MG PO TABS
50.0000 mg | ORAL_TABLET | Freq: Every day | ORAL | 0 refills | Status: DC
  Filled 2021-05-25: qty 30, 30d supply, fill #0

## 2021-07-26 ENCOUNTER — Ambulatory Visit
Admission: RE | Admit: 2021-07-26 | Discharge: 2021-07-26 | Disposition: A | Discharge status: Home / Self Care | Payer: Medicare Other | Source: Ambulatory Visit | Attending: Nurse Practitioner | Admitting: Nurse Practitioner

## 2021-07-26 ENCOUNTER — Other Ambulatory Visit: Payer: Self-pay | Admitting: Nurse Practitioner

## 2021-07-26 DIAGNOSIS — R928 Other abnormal and inconclusive findings on diagnostic imaging of breast: Secondary | ICD-10-CM

## 2021-07-26 DIAGNOSIS — R2231 Localized swelling, mass and lump, right upper limb: Secondary | ICD-10-CM

## 2021-08-06 ENCOUNTER — Other Ambulatory Visit: Payer: Self-pay

## 2021-08-06 ENCOUNTER — Inpatient Hospital Stay: Payer: Medicare Other | Admitting: Hematology

## 2021-08-06 ENCOUNTER — Inpatient Hospital Stay: Payer: Medicare Other

## 2021-08-06 NOTE — Progress Notes (Deleted)
[name]   [phone] [phone]   Clinic Follow up Note   Patient Care Team: [name], NP as PCP - General (Nurse Practitioner) [name], MD as Consulting Physician (Hematology) Fanny Skates, MD as Consulting Physician (General Surgery) Alla Feeling, NP as Nurse Practitioner (Nurse Practitioner) Gery Pray, MD as Consulting Physician (Radiation Oncology)  Date of Service:  08/06/2021  CHIEF COMPLAINT: f/u of left breast cancer  CURRENT THERAPY:  Surveillance  ASSESSMENT & PLAN:  Amanda Duarte is a 66 y.o. post-menopausal female with   1. 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  -Due to her excellent response to neoadjuvant chemotherapy, adjuvant chemotherapy was not recommended -Given her triple negative disease and lymph node involvement, she is at risk for breast cancer recurrence. She is on breast cancer surveillance.  -she is clinically doing well. Labs reviewed, CBC is WNL, CMP shows slightly low potassium. She asked about multivitamins today, which I recommend. Physical exam was unremarkable. There is no clinical concern for recurrence. -continue surveillance, proceed to mammogram as scheduled in July. -f/u in 6 months   2. Right Axillary Mass -02/16/21 mammogram showed mass in the right axilla -f/u US on 04/21/21 showed a mildly prominent node in right axilla, which is normal by [name] criteria and favored reactive. -she is scheduled for short-term f/u right breast MM and right axilla [name] on 07/26/21. -axillary exam today was negative    3. HTN and DM  -Continue follow-up with primary care physician  -on Lipitor, Losartan and Janumet, glimepiride -Her HTN is controlled and her DM uncontrolled. BG up to 436 today (05/21/21). She tells me she has  not been taking her medications and delayed her last appointment because of her son's illness. -she has some skin rashes on exam today. I advised her to restart her medications when she gets home today. I also recommend she drink a lot of water. -I advised her to rescheduled f/u with her PCP.    4. Osteopenia  -Baseline 07/2018 DEXA show Lowest T-score at -1.7 at [facility]. No significant risk for Fx     PLAN:   -MM and [name] on 07/26/21 -lab and f/u in 6 months   No problem-specific Assessment & Plan notes found for this encounter.   SUMMARY OF ONCOLOGIC HISTORY: Oncology History Overview Note  Cancer Staging Malignant neoplasm of upper-inner quadrant of left breast in female, estrogen receptor positive ([place]) Staging form: Breast, AJCC 8th Edition - Clinical stage from 05/01/2017: Stage Unknown (cTX, cN2, cM0, G3, ER-, PR-, HER2-) - Signed by [name], MD on 11/01/2017 - Pathologic stage from 11/20/2017: No Stage Recommended (ypT0, pN67mi, cM0, GX, ER-, PR-, HER2-) - Signed by [name], MD on 12/10/2017     Malignant neoplasm of upper-inner quadrant of left breast in female, estrogen receptor positive ([place])  04/28/2017 Mammogram   IMPRESSION: Two adjacent masses/enlarged lymph nodes in the LOWER LEFT axilla, the largest measuring 2.1 cm. Tissue sampling of 1 of these is recommended to exclude malignancy/lymphoma. No mammographic evidence of breast malignancy bilaterally.    05/01/2017 Initial Biopsy   Diagnosis 05/01/17 Lymph node, needle/core biopsy, low left inferior axillary - METASTATIC POORLY DIFFERENTIATED CARCINOMA TO A LYMPH NODE. SEE NOTE.   05/01/2017 Cancer Staging   Staging form: Breast, AJCC 8th Edition - Clinical stage from 05/01/2017: Stage Unknown (  cTX, cN1, cM0, G3, ER-, PR-, HER2-) - Signed by [name], MD on 06/02/2017    05/01/2017 Receptors her2   Lymph Node Biopsy:  HER2-Negative  PR-Negative  ER- Negative    05/10/2017 Imaging   MR Breast W WO Contrast  05/10/17 IMPRESSION: No MRI evidence of malignancy in the right breast. Area of week stippled non mass enhancement in the left breast upper inner quadrant. Separate area of thin linear non mass enhancement in the subareolar left breast. Three grossly abnormal left axillary lymph nodes, and more than 4 less than 1 cm indeterminate left axillary lymph nodes. No evidence of right axillary lymphadenopathy.   05/17/2017 Initial Biopsy   Diagnosis 05/17/17 1. 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 2. Breast, left, needle core biopsy, upper inner post MR enhancement - MICROSCOPIC FOCUS OF DUCTAL CARCINOMA IN SITU.   05/17/2017 Receptors her2   Left Breast Biopsy:  ER-Negative PR-Negative HER2-Negative   05/22/2017 Initial Diagnosis   Malignant neoplasm of upper-inner quadrant of left breast in female, estrogen receptor positive ([place])   06/01/2017 Imaging   Boen Scan 06/01/17 IMPRESSION: No definite scintigraphic evidence of osseous metastatic disease.   Posttraumatic and postsurgical uptake at the LEFT knee.   Nonspecific soft tissue distribution of tracer at the LEFT thigh, could represent contusion, hemorrhage, soft tissue edema, or soft tissue calcifications such as from heterotopic calcification and myositis ossificans; recommend clinical correlation and consider dedicated LEFT femoral radiographs.   Single focus of nonspecific increased tracer localization at the lateral LEFT orbit.     06/01/2017 Imaging   06/01/2017 Bone Scan IMPRESSION: No definite scintigraphic evidence of osseous metastatic disease.   Posttraumatic and postsurgical uptake at the LEFT knee.   Nonspecific soft tissue distribution of tracer at the LEFT thigh, could represent contusion, hemorrhage, soft tissue edema, or soft tissue calcifications such as from heterotopic calcification and myositis ossificans; recommend  clinical correlation and consider dedicated LEFT femoral radiographs.   Single focus of nonspecific increased tracer localization at the lateral LEFT orbit.   06/09/2017 -  Chemotherapy   ddAC every 2 weeks for 4 weeks starting 06/09/17-07/21/17 followed by weekly [place] and taxol for 12 weeks 08/04/17-10/20/17.    10/24/2017 Imaging   Breast MRI B/l 10/24/17 IMPRESSION: 1. No residual enhancement in the LEFT breast following neoadjuvant treatment. 2. Significantly smaller LEFT axillary lymph nodes, largest now measuring 1.4 centimeters. The remainder of LEFT axillary lymph nodes demonstrate normal fatty hila. RECOMMENDATION: Treatment plan for known LEFT breast cancer.   11/20/2017 Cancer Staging   Staging form: Breast, AJCC 8th Edition - Pathologic stage from 11/20/2017: No Stage Recommended (ypT0, pN76m, cM0, GX, ER-, PR-, HER2-) - Signed by [name] on 12/10/2017   11/20/2017 Surgery   Left mastectomy and SLN biopsy by Dr. IDalbert Batman   11/20/2017 Pathology Results   Breast, modified radical mastectomy , left - MICROSCOPIC FOCI OF RESIDUAL METASTATIC CARCINOMA INVOLVING TWO OF EIGHT LYMPH NODES (2/8); LARGEST CONTINUOUS FOCUS MEASURES 0.1 CM. - NO EVIDENCE OF RESIDUAL CARCINOMA IN THE MASTECTOMY SPECIMEN - MARKED THERAPY-RELATED CHANGES, INCLUDING DENSE HYALIN FIBROSIS     11/20/2017 Receptors her2   ER- PR- HER2- (IHC 1+)   01/02/2018 - 02/14/2018 Radiation Therapy   Adjuvant Radiation 01/02/18 - 02/14/18   07/31/2018 Imaging   Baseline DEXA 07/31/18  ASSESSMENT: The BMD measured at [facility] L1-L4 is 0.973 g/cm2 with a T-score of -1.7.  This patient is considered OSTEOPENIC according to [facility]) criteria. The scan quality is good.   Site Region Measured Date Measured Age YA T-score BMD Significant CHANGE   [facility]  L1-L4      07/31/2018    63.1         -1.7    0.973 g/cm2   DualFemur Neck Left  07/31/2018    63.1         -1.5    0.828  g/cm2   DualFemur Total Mean 07/31/2018    63.1         0.1     1.016 g/cm2   11/05/2018 Survivorship   Per [name], NP       INTERVAL HISTORY:  Amanda Duarte is here for a follow up of breast cancer. She was last seen by me on 05/21/2021.  All other systems were reviewed with the patient and are negative.  MEDICAL HISTORY:  Past Medical History:  Diagnosis Date   Cancer ([place]) 03/2017   left breast cancer   Diabetes mellitus without complication ([place])    Hyperlipidemia    Hypertension    Malignant neoplasm of left breast ([place])    Stroke (cerebrum) ([place])    Tibial plateau fracture, left    04-13-17 had ORIF    SURGICAL HISTORY: Past Surgical History:  Procedure Laterality Date   MASTECTOMY Left 2019   MASTECTOMY MODIFIED RADICAL Left 11/20/2017   Procedure: LEFT MODIFIED RADICAL MASTECTOMY;  Surgeon: Fanny Skates, MD;  Location: [facility];  Service: General;  Laterality: Left;   ORIF TIBIA PLATEAU Left 04/13/2017   Procedure: OPEN REDUCTION INTERNAL FIXATION (ORIF) TIBIAL PLATEAU;  Surgeon: Shona Needles, MD;  Location: [facility];  Service: Orthopedics;  Laterality: Left;   PORT-A-CATH REMOVAL Right 11/20/2017   Procedure: REMOVAL PORT-A-CATH;  Surgeon: Fanny Skates, MD;  Location: [facility];  Service: General;  Laterality: Right;   PORTACATH PLACEMENT Right 05/31/2017   Procedure: INSERTION PORT-A-CATH;  Surgeon: Fanny Skates, MD;  Location: [facility];  Service: General;  Laterality: Right;    I have reviewed the social history and family history with the patient and they are unchanged from previous note.  ALLERGIES:  is allergic to victoza [liraglutide].  MEDICATIONS:  Current Outpatient Medications  Medication Sig Dispense Refill   Accu-Chek Softclix Lancets lancets Use to check blood sugar 3 times daily. E11.65 100 each 3   atorvastatin (LIPITOR) 40 MG tablet TAKE 1 TABLET BY MOUTH ONCE A DAY 90 tablet 2   Blood Glucose Monitoring Suppl (ACCU-CHEK  GUIDE) w/Device KIT use kit to check blood glucose three times daily 1 kit 0   Blood Pressure Monitor DEVI Please provide patient with insurance approved blood pressure monitor 1 each 0   citalopram (CELEXA) 20 MG tablet TAKE 1 TABLET BY MOUTH ONCE A DAY 90 tablet 3   fenofibrate (TRICOR) 145 MG tablet TAKE 1 TABLET BY MOUTH ONCE A DAY 90 tablet 2   glimepiride (AMARYL) 4 MG tablet Take 1 tablet (4 mg total) by mouth in the morning and at bedtime. 180 tablet 1   glucose blood (ACCU-CHEK GUIDE) test strip Use to check blood sugar 3 times daily. E11.65 100 each 11   hydrochlorothiazide (HYDRODIURIL) 25 MG tablet Take 1 tablet (25 mg total) by mouth daily. Please make an appointment with [facility]! 30 tablet 0   hydrOXYzine (ATARAX) 10 MG tablet Take 1 tablet (10 mg total) by mouth 3 (three) times daily  as needed for anxiety. 60 tablet 3   insulin glargine (LANTUS) 100 UNIT/ML Solostar Pen Inject 20 Units into the skin daily. 18 mL 1   insulin lispro (HUMALOG) 100 UNIT/ML injection Inject 0.04 mLs (4 Units total) into the skin 3 (three) times daily before meals. 10 mL 11   Insulin Pen Needle 31G X 5 MM MISC USE AS INSTRUCTED. INJECT INTO THE SKIN ONCE NIGHTLY. 100 each 6   Insulin Syringe-Needle U-100 30G X 5/16" 1 ML MISC Use as directed to inject into the skin 3 times daily. 200 each 6   losartan (COZAAR) 50 MG tablet Take 1 tablet (50 mg total) by mouth daily. Please make an appointment with [facility]! 30 tablet 0   Na Sulfate-K Sulfate-Mg Sulf (SUPREP BOWEL PREP KIT) 17.5-3.13-1.6 GM/177ML SOLN Take 1 kit by mouth as directed. For colonoscopy prep 354 mL 0   potassium chloride SA (KLOR-CON) 20 MEQ tablet Take 1 tablet (20 mEq total) by mouth daily for 5 days. 5 tablet 0   sitaGLIPtin-metformin (JANUMET) 50-1000 MG tablet TAKE 1 TABLET BY MOUTH 2 TIMES DAILY 180 tablet 1   triamcinolone cream (KENALOG) 0.1 % Apply 1 application topically 2 (two) times daily. 80 g 1   Current Facility-Administered  Medications  Medication Dose Route Frequency Provider Last Rate Last Admin   0.9 %  sodium chloride infusion  500 mL Intravenous Once [place], MD        PHYSICAL EXAMINATION: ECOG PERFORMANCE STATUS: 0 - Asymptomatic  There were no vitals filed for this visit.  Wt Readings from Last 3 Encounters:  05/21/21 123 lb 6 oz (56 kg)  04/16/21 124 lb 12.8 oz (56.6 kg)  02/15/21 122 lb 4 oz (55.5 kg)     GENERAL:alert, no distress and comfortable SKIN: skin color, texture, turgor are normal, no rashes or significant lesions EYES: normal, Conjunctiva are pink and non-injected, sclera clear  NECK: supple, thyroid normal size, non-tender, without nodularity LYMPH:  no palpable lymphadenopathy in the cervical, axillary LUNGS: clear to auscultation and percussion with normal breathing effort HEART: regular rate & rhythm and no murmurs and no lower extremity edema ABDOMEN:abdomen soft, non-tender and normal bowel sounds Musculoskeletal:no cyanosis of digits and no clubbing  NEURO: alert & oriented x 3 with fluent speech, no focal motor/sensory deficits BREAST: No palpable mass, nodules or adenopathy bilaterally. Breast exam benign.   LABORATORY DATA:  I have reviewed the data as listed    Latest Ref Rng & Units 05/21/2021    8:21 AM 04/16/2021    8:58 AM 02/15/2021   12:33 PM  CBC  WBC 4.0 - 10.5 K/uL 4.6  6.3  5.5   Hemoglobin 12.0 - 15.0 g/dL 12.9  12.8  13.9   Hematocrit 36.0 - 46.0 % 37.0  37.5  40.4   Platelets 150 - 400 K/uL 153  163  150         Latest Ref Rng & Units 05/21/2021    8:21 AM 04/16/2021    8:58 AM 02/15/2021   12:33 PM  CMP  Glucose 70 - 99 mg/dL 436  364  411   BUN 8 - 23 mg/dL 14  13  16    Creatinine 0.44 - 1.00 mg/dL 0.75  0.65  0.74   Sodium 135 - 145 mmol/L 132  133  133   Potassium 3.5 - 5.1 mmol/L 3.4  3.8  3.9   Chloride 98 - 111 mmol/L 95  97  93   CO2 22 -  32 mmol/L 30  27  27    Calcium 8.9 - 10.3 mg/dL 9.3  9.9  9.7   Total Protein 6.5 -  8.1 g/dL 8.3  8.3  8.8   Total Bilirubin 0.3 - 1.2 mg/dL 0.5  0.5  0.6   Alkaline Phos 38 - 126 U/L 133  102  111   AST 15 - 41 U/L 25  25  20    ALT 0 - 44 U/L 18  19  17        RADIOGRAPHIC STUDIES: I have personally reviewed the radiological images as listed and agreed with the findings in the report. No results found.    No orders of the defined types were placed in this encounter.  All questions were answered. The patient knows to call the clinic with any problems, questions or concerns. No barriers to learning was detected. The total time spent in the appointment was 25 minutes.     [name], MD 08/06/2021

## 2021-10-05 ENCOUNTER — Encounter: Payer: Self-pay | Admitting: Nurse Practitioner

## 2021-10-05 ENCOUNTER — Other Ambulatory Visit: Payer: Self-pay

## 2021-10-05 ENCOUNTER — Ambulatory Visit: Payer: Medicare Other | Attending: Nurse Practitioner | Admitting: Nurse Practitioner

## 2021-10-05 VITALS — BP 148/66 | HR 83 | Temp 98.0°F | Ht 62.0 in | Wt 118.6 lb

## 2021-10-05 DIAGNOSIS — E876 Hypokalemia: Secondary | ICD-10-CM

## 2021-10-05 DIAGNOSIS — D649 Anemia, unspecified: Secondary | ICD-10-CM | POA: Diagnosis not present

## 2021-10-05 DIAGNOSIS — E785 Hyperlipidemia, unspecified: Secondary | ICD-10-CM | POA: Diagnosis not present

## 2021-10-05 DIAGNOSIS — F419 Anxiety disorder, unspecified: Secondary | ICD-10-CM

## 2021-10-05 DIAGNOSIS — F321 Major depressive disorder, single episode, moderate: Secondary | ICD-10-CM | POA: Diagnosis not present

## 2021-10-05 DIAGNOSIS — E781 Pure hyperglyceridemia: Secondary | ICD-10-CM

## 2021-10-05 DIAGNOSIS — I10 Essential (primary) hypertension: Secondary | ICD-10-CM | POA: Diagnosis not present

## 2021-10-05 DIAGNOSIS — Z91148 Patient's other noncompliance with medication regimen for other reason: Secondary | ICD-10-CM | POA: Diagnosis not present

## 2021-10-05 DIAGNOSIS — E1165 Type 2 diabetes mellitus with hyperglycemia: Secondary | ICD-10-CM | POA: Diagnosis present

## 2021-10-05 DIAGNOSIS — Z794 Long term (current) use of insulin: Secondary | ICD-10-CM | POA: Diagnosis not present

## 2021-10-05 DIAGNOSIS — Z7984 Long term (current) use of oral hypoglycemic drugs: Secondary | ICD-10-CM | POA: Diagnosis not present

## 2021-10-05 DIAGNOSIS — E118 Type 2 diabetes mellitus with unspecified complications: Secondary | ICD-10-CM

## 2021-10-05 DIAGNOSIS — Z23 Encounter for immunization: Secondary | ICD-10-CM

## 2021-10-05 DIAGNOSIS — Z79899 Other long term (current) drug therapy: Secondary | ICD-10-CM | POA: Insufficient documentation

## 2021-10-05 DIAGNOSIS — F32A Depression, unspecified: Secondary | ICD-10-CM

## 2021-10-05 LAB — POCT GLYCOSYLATED HEMOGLOBIN (HGB A1C): Hemoglobin A1C: 13.3 % — AB (ref 4.0–5.6)

## 2021-10-05 MED ORDER — CITALOPRAM HYDROBROMIDE 20 MG PO TABS
20.0000 mg | ORAL_TABLET | Freq: Every day | ORAL | 3 refills | Status: DC
  Filled 2021-10-05: qty 90, 90d supply, fill #0

## 2021-10-05 MED ORDER — HYDROCHLOROTHIAZIDE 25 MG PO TABS
25.0000 mg | ORAL_TABLET | Freq: Every day | ORAL | 0 refills | Status: DC
  Filled 2021-10-05: qty 30, 30d supply, fill #0

## 2021-10-05 MED ORDER — GLIMEPIRIDE 4 MG PO TABS
4.0000 mg | ORAL_TABLET | Freq: Two times a day (BID) | ORAL | 1 refills | Status: DC
  Filled 2021-10-05: qty 180, 90d supply, fill #0

## 2021-10-05 MED ORDER — SITAGLIPTIN PHOS-METFORMIN HCL 50-1000 MG PO TABS
1.0000 | ORAL_TABLET | Freq: Two times a day (BID) | ORAL | 1 refills | Status: DC
  Filled 2021-10-05 – 2021-10-13 (×2): qty 180, 90d supply, fill #0

## 2021-10-05 MED ORDER — POTASSIUM CHLORIDE CRYS ER 20 MEQ PO TBCR
20.0000 meq | EXTENDED_RELEASE_TABLET | Freq: Every day | ORAL | 3 refills | Status: DC
  Filled 2021-10-05: qty 30, 30d supply, fill #0

## 2021-10-05 MED ORDER — LOSARTAN POTASSIUM 50 MG PO TABS
50.0000 mg | ORAL_TABLET | Freq: Every day | ORAL | 1 refills | Status: DC
  Filled 2021-10-05: qty 90, 90d supply, fill #0

## 2021-10-05 MED ORDER — HYDROXYZINE HCL 10 MG PO TABS
10.0000 mg | ORAL_TABLET | Freq: Three times a day (TID) | ORAL | 3 refills | Status: DC | PRN
  Filled 2021-10-05: qty 60, 20d supply, fill #0

## 2021-10-05 MED ORDER — ATORVASTATIN CALCIUM 40 MG PO TABS
40.0000 mg | ORAL_TABLET | Freq: Every day | ORAL | 2 refills | Status: DC
  Filled 2021-10-05: qty 90, 90d supply, fill #0

## 2021-10-05 MED ORDER — FENOFIBRATE 145 MG PO TABS
145.0000 mg | ORAL_TABLET | Freq: Every day | ORAL | 2 refills | Status: DC
  Filled 2021-10-05: qty 90, 90d supply, fill #0

## 2021-10-05 MED ORDER — INSULIN GLARGINE 100 UNIT/ML SOLOSTAR PEN
20.0000 [IU] | PEN_INJECTOR | Freq: Two times a day (BID) | SUBCUTANEOUS | 1 refills | Status: DC
  Filled 2021-10-05: qty 36, 90d supply, fill #0

## 2021-10-05 NOTE — Patient Instructions (Signed)
Vitamin B 12 or B complex Citracal or Calcium with Vit D Magnesium

## 2021-10-05 NOTE — Progress Notes (Signed)
Assessment & Plan:  Amanda Duarte was seen today for diabetes.  Diagnoses and all orders for this visit:  Essential hypertension -     CMP14+EGFR -     hydrochlorothiazide (HYDRODIURIL) 25 MG tablet; Take 1 tablet (25 mg total) by mouth daily. Please make an appointment with Kaevion Sinclair! -     losartan (COZAAR) 50 MG tablet; Take 1 tablet (50 mg total) by mouth daily.  Dyslipidemia, goal LDL below 70 -     Lipid panel  Anemia, unspecified type -     CBC with Differential  Anxiety and depression -     citalopram (CELEXA) 20 MG tablet; Take 1 tablet (20 mg total) by mouth daily. For depression -     hydrOXYzine (ATARAX) 10 MG tablet; Take 1 tablet (10 mg total) by mouth 3 (three) times daily as needed for anxiety.  Pure hypertriglyceridemia -     fenofibrate (TRICOR) 145 MG tablet; Take 1 tablet (145 mg total) by mouth daily. -     atorvastatin (LIPITOR) 40 MG tablet; Take 1 tablet (40 mg total) by mouth daily.  Type 2 diabetes mellitus with hyperglycemia, with long-term current use of insulin (HCC) -     CMP14+EGFR -     Ambulatory referral to Ophthalmology -     glimepiride (AMARYL) 4 MG tablet; Take 1 tablet (4 mg total) by mouth in the morning and at bedtime. -     sitaGLIPtin-metformin (JANUMET) 50-1000 MG tablet; TAKE 1 TABLET BY MOUTH 2 TIMES DAILY -     insulin glargine (LANTUS) 100 UNIT/ML Solostar Pen; Inject 20 Units into the skin 2 (two) times daily. -     POCT glycosylated hemoglobin (Hb A1C)  Need for shingles vaccine -     Varicella-zoster vaccine IM  Need for immunization against influenza -     Flu Vaccine QUAD 42moIM (Fluarix, Fluzone & Alfiuria Quad PF)  Hypokalemia -     potassium chloride SA (KLOR-CON M) 20 MEQ tablet; Take 1 tablet (20 mEq total) by mouth daily.  Current moderate episode of major depressive disorder without prior episode (HMiner SSRI resumed along with anxiolytic   Patient has been counseled on age-appropriate routine health concerns for screening  and prevention. These are reviewed and up-to-date. Referrals have been placed accordingly. Immunizations are up-to-date or declined.    Subjective:   Chief Complaint  Patient presents with   Diabetes   HPI Amanda PLACIDE69y.o. female presents to office today for follow up to DM and HTN. Her son passed in February. She has not been taking her medications daily as prescribed. [place] and [place].   VRI was used to communicate directly with patient for the entire encounter including providing detailed patient instructions.     DM 2 Not well controlled. Again as stated she has not been taking any of her medications. I have recommended she resume all of her medications and we discussed the potential side effects of poorly controlled DM.  Lab Results  Component Value Date   HGBA1C 13.3 (A) 10/05/2021    Lab Results  Component Value Date   HGBA1C 15.0 (A) 02/15/2021    LDL at goal Lab Results  Component Value Date   LDLCALC 20 10/05/2021    Blood pressure also elevated today due to medication non adherence.  BP Readings from Last 3 Encounters:  10/05/21 (!) 148/66  05/21/21 (!) 129/55  04/16/21 ([name]  121/58    Review of Systems  Constitutional:  Negative for fever, malaise/fatigue and weight loss.  HENT: Negative.  Negative for nosebleeds.   Eyes: Negative.  Negative for blurred vision, double vision and photophobia.  Respiratory: Negative.  Negative for cough and shortness of breath.   Cardiovascular: Negative.  Negative for chest pain, palpitations and leg swelling.  Gastrointestinal: Negative.  Negative for heartburn, nausea and vomiting.  Musculoskeletal: Negative.  Negative for myalgias.  Neurological: Negative.  Negative for dizziness, focal weakness, seizures and headaches.  Psychiatric/Behavioral:  Positive for depression. Negative for suicidal ideas. The patient is nervous/anxious.     Past Medical  History:  Diagnosis Date   Cancer (HCC) 03/2017   left breast cancer   Diabetes mellitus without complication (HCC)    Hyperlipidemia    Hypertension    Malignant neoplasm of left breast (HCC)    Stroke (cerebrum) (HCC)    Tibial plateau fracture, left    04-13-17 had ORIF    Past Surgical History:  Procedure Laterality Date   MASTECTOMY Left 2019   MASTECTOMY MODIFIED RADICAL Left 11/20/2017   Procedure: LEFT MODIFIED RADICAL MASTECTOMY;  Surgeon: Ingram, Haywood, MD;  Location: [facility];  Service: General;  Laterality: Left;   ORIF TIBIA PLATEAU Left 04/13/2017   Procedure: OPEN REDUCTION INTERNAL FIXATION (ORIF) TIBIAL PLATEAU;  Surgeon: [name], Kevin P, MD;  Location: [facility] OR;  Service: Orthopedics;  Laterality: Left;   PORT-A-CATH REMOVAL Right 11/20/2017   Procedure: REMOVAL PORT-A-CATH;  Surgeon: Ingram, Haywood, MD;  Location: [facility];  Service: General;  Laterality: Right;   PORTACATH PLACEMENT Right 05/31/2017   Procedure: INSERTION PORT-A-CATH;  Surgeon: Ingram, Haywood, MD;  Location: [facility];  Service: General;  Laterality: Right;    Family History  Problem Relation Age of Onset   Heart Problems Mother    Diabetes Son    Breast cancer Neg Hx    Colon cancer Neg Hx    Rectal cancer Neg Hx    Stomach cancer Neg Hx    Esophageal cancer Neg Hx     Social History Reviewed with no changes to be made today.   Outpatient Medications Prior to Visit  Medication Sig Dispense Refill   Accu-Chek Softclix Lancets lancets Use to check blood sugar 3 times daily. E11.65 100 each 3   Blood Glucose Monitoring Suppl (ACCU-CHEK GUIDE) w/Device KIT use kit to check blood glucose three times daily 1 kit 0   Blood Pressure Monitor DEVI Please provide patient with insurance approved blood pressure monitor 1 each 0   glucose blood (ACCU-CHEK GUIDE) test strip Use to check blood sugar 3 times daily. E11.65 100 each 11   insulin lispro (HUMALOG) 100 UNIT/ML injection Inject 0.04  mLs (4 Units total) into the skin 3 (three) times daily before meals. 10 mL 11   Insulin Pen Needle 31G X 5 MM MISC USE AS INSTRUCTED. INJECT INTO THE SKIN ONCE NIGHTLY. 100 each 6   Insulin Syringe-Needle U-100 30G X 5/16" 1 ML MISC Use as directed to inject into the skin 3 times daily. 200 each 6   Na Sulfate-K Sulfate-Mg Sulf (SUPREP BOWEL PREP KIT) 17.5-3.13-1.6 GM/177ML SOLN Take 1 kit by mouth as directed. For colonoscopy prep 354 mL 0   triamcinolone cream (KENALOG) 0.1 % Apply 1 application topically 2 (two) times daily. 80 g 1   atorvastatin (LIPITOR) 40 MG tablet TAKE 1 TABLET BY MOUTH ONCE A DAY 90 tablet 2   fenofibrate (TRICOR) 145   MG tablet TAKE 1 TABLET BY MOUTH ONCE A DAY 90 tablet 2   hydrochlorothiazide (HYDRODIURIL) 25 MG tablet Take 1 tablet (25 mg total) by mouth daily. Please make an appointment with Marien Manship! 30 tablet 0   hydrOXYzine (ATARAX) 10 MG tablet Take 1 tablet (10 mg total) by mouth 3 (three) times daily as needed for anxiety. 60 tablet 3   losartan (COZAAR) 50 MG tablet Take 1 tablet (50 mg total) by mouth daily. Please make an appointment with Alyxander Kollmann! 30 tablet 0   sitaGLIPtin-metformin (JANUMET) 50-1000 MG tablet TAKE 1 TABLET BY MOUTH 2 TIMES DAILY 180 tablet 1   citalopram (CELEXA) 20 MG tablet TAKE 1 TABLET BY MOUTH ONCE A DAY (Patient not taking: Reported on 10/05/2021) 90 tablet 3   glimepiride (AMARYL) 4 MG tablet Take 1 tablet (4 mg total) by mouth in the morning and at bedtime. (Patient not taking: Reported on 10/05/2021) 180 tablet 1   insulin glargine (LANTUS) 100 UNIT/ML Solostar Pen Inject 20 Units into the skin daily. 18 mL 1   potassium chloride SA (KLOR-CON) 20 MEQ tablet Take 1 tablet (20 mEq total) by mouth daily for 5 days. (Patient not taking: Reported on 10/05/2021) 5 tablet 0   Facility-Administered Medications Prior to Visit  Medication Dose Route Frequency Provider Last Rate Last Admin   0.9 %  sodium chloride infusion  500 mL Intravenous Once  Thornton Park, MD        Allergies  Allergen Reactions   Victoza [Liraglutide] Nausea And Vomiting       Objective:    BP (!) 148/66   Pulse 83   Temp 98 F (36.7 C) (Oral)   Ht 5' 2" (1.575 m)   Wt 118 lb 9.6 oz (53.8 kg)   SpO2 92%   BMI 21.69 kg/m  Wt Readings from Last 3 Encounters:  10/05/21 118 lb 9.6 oz (53.8 kg)  05/21/21 123 lb 6 oz (56 kg)  04/16/21 124 lb 12.8 oz (56.6 kg)    Physical Exam Vitals and nursing note reviewed.  Constitutional:      Appearance: She is well-developed.  HENT:     Head: Normocephalic and atraumatic.  Cardiovascular:     Rate and Rhythm: Normal rate and regular rhythm.     Heart sounds: Normal heart sounds. No murmur heard.    No friction rub. No gallop.  Pulmonary:     Effort: Pulmonary effort is normal. No tachypnea or respiratory distress.     Breath sounds: Normal breath sounds. No decreased breath sounds, wheezing, rhonchi or rales.  Chest:     Chest wall: No tenderness.  Abdominal:     General: Bowel sounds are normal.     Palpations: Abdomen is soft.  Musculoskeletal:        General: Normal range of motion.     Cervical back: Normal range of motion.  Skin:    General: Skin is warm and dry.  Neurological:     Mental Status: She is alert and oriented to person, place, and time.     Coordination: Coordination normal.  Psychiatric:        Behavior: Behavior normal. Behavior is cooperative.        Thought Content: Thought content normal.        Judgment: Judgment normal.          Patient has been counseled extensively about nutrition and exercise as well as the importance of adherence with medications and regular follow-up. The patient was  given clear instructions to go to ER or return to medical center if symptoms don't improve, worsen or new problems develop. The patient verbalized understanding.   Follow-up: Return for 4 week f/u LUKE meter check. See me in 3 months.   Amanda W Fleming, FNP-BC [facility] [place], [place] [phone]   10/13/2021, 9:43 AM 

## 2021-10-06 ENCOUNTER — Other Ambulatory Visit: Payer: Self-pay

## 2021-10-06 LAB — CMP14+EGFR
ALT: 7 IU/L (ref 0–32)
AST: 15 IU/L (ref 0–40)
Albumin/Globulin Ratio: 1 — ABNORMAL LOW (ref 1.2–2.2)
Albumin: 3.9 g/dL (ref 3.9–4.9)
Alkaline Phosphatase: 133 IU/L — ABNORMAL HIGH (ref 44–121)
BUN/Creatinine Ratio: 13 (ref 12–28)
BUN: 8 mg/dL (ref 8–27)
Bilirubin Total: 0.5 mg/dL (ref 0.0–1.2)
CO2: 23 mmol/L (ref 20–29)
Calcium: 9.2 mg/dL (ref 8.7–10.3)
Chloride: 93 mmol/L — ABNORMAL LOW (ref 96–106)
Creatinine, Ser: 0.6 mg/dL (ref 0.57–1.00)
Globulin, Total: 3.9 g/dL (ref 1.5–4.5)
Glucose: 351 mg/dL — ABNORMAL HIGH (ref 70–99)
Potassium: 4.3 mmol/L (ref 3.5–5.2)
Sodium: 134 mmol/L (ref 134–144)
Total Protein: 7.8 g/dL (ref 6.0–8.5)
eGFR: 99 mL/min/{1.73_m2} (ref >59)

## 2021-10-06 LAB — CBC WITH DIFFERENTIAL/PLATELET
Basophils Absolute: 0 10*3/uL (ref 0.0–0.2)
Basos: 1 %
EOS (ABSOLUTE): 0.1 10*3/uL (ref 0.0–0.4)
Eos: 3 %
Hematocrit: 22.2 % — ABNORMAL LOW (ref 34.0–46.6)
Hemoglobin: 7.2 g/dL — ABNORMAL LOW (ref 11.1–15.9)
Immature Grans (Abs): 0 10*3/uL (ref 0.0–0.1)
Immature Granulocytes: 0 %
Lymphocytes Absolute: 2.4 10*3/uL (ref 0.7–3.1)
Lymphs: 52 %
MCH: 34.3 pg — ABNORMAL HIGH (ref 26.6–33.0)
MCHC: 32.4 g/dL (ref 31.5–35.7)
MCV: 106 fL — ABNORMAL HIGH (ref 79–97)
Monocytes Absolute: 0.5 10*3/uL (ref 0.1–0.9)
Monocytes: 10 %
NRBC: 2 % — ABNORMAL HIGH (ref 0–0)
Neutrophils Absolute: 1.6 10*3/uL (ref 1.4–7.0)
Neutrophils: 34 %
Platelets: 82 10*3/uL — CL (ref 150–450)
RBC: 2.1 x10E6/uL — CL (ref 3.77–5.28)
RDW: 17.1 % — ABNORMAL HIGH (ref 11.7–15.4)
WBC: 4.6 10*3/uL (ref 3.4–10.8)

## 2021-10-06 LAB — LIPID PANEL
Chol/HDL Ratio: 3.1 ratio (ref 0.0–4.4)
Cholesterol, Total: 91 mg/dL — ABNORMAL LOW (ref 100–199)
HDL: 29 mg/dL — ABNORMAL LOW (ref >39)
LDL Chol Calc (NIH): 20 mg/dL (ref 0–99)
Triglycerides: 285 mg/dL — ABNORMAL HIGH (ref 0–149)
VLDL Cholesterol Cal: 42 mg/dL — ABNORMAL HIGH (ref 5–40)

## 2021-10-11 ENCOUNTER — Other Ambulatory Visit: Payer: Self-pay | Admitting: Nurse Practitioner

## 2021-10-11 DIAGNOSIS — D696 Thrombocytopenia, unspecified: Secondary | ICD-10-CM

## 2021-10-12 NOTE — Progress Notes (Signed)
Interpreter # [phone]

## 2021-10-13 ENCOUNTER — Encounter: Payer: Self-pay | Admitting: Nurse Practitioner

## 2021-10-13 ENCOUNTER — Ambulatory Visit: Payer: Medicare Other | Attending: Nurse Practitioner

## 2021-10-13 ENCOUNTER — Other Ambulatory Visit: Payer: Self-pay

## 2021-10-14 LAB — CBC WITH DIFFERENTIAL/PLATELET
Basophils Absolute: 0 10*3/uL (ref 0.0–0.2)
Basos: 1 %
EOS (ABSOLUTE): 0.1 10*3/uL (ref 0.0–0.4)
Eos: 3 %
Hematocrit: 20.9 % — ABNORMAL LOW (ref 34.0–46.6)
Hemoglobin: 6.9 g/dL — CL (ref 11.1–15.9)
Immature Grans (Abs): 0 10*3/uL (ref 0.0–0.1)
Immature Granulocytes: 0 %
Lymphocytes Absolute: 2.2 10*3/uL (ref 0.7–3.1)
Lymphs: 56 %
MCH: 35 pg — ABNORMAL HIGH (ref 26.6–33.0)
MCHC: 33 g/dL (ref 31.5–35.7)
MCV: 106 fL — ABNORMAL HIGH (ref 79–97)
Monocytes Absolute: 0.3 10*3/uL (ref 0.1–0.9)
Monocytes: 9 %
Neutrophils Absolute: 1.2 10*3/uL — ABNORMAL LOW (ref 1.4–7.0)
Neutrophils: 31 %
Platelets: 57 10*3/uL — CL (ref 150–450)
RBC: 1.97 x10E6/uL — CL (ref 3.77–5.28)
RDW: 16.2 % — ABNORMAL HIGH (ref 11.7–15.4)
WBC: 3.9 10*3/uL (ref 3.4–10.8)

## 2021-10-21 ENCOUNTER — Encounter (HOSPITAL_COMMUNITY): Payer: Self-pay

## 2021-10-21 ENCOUNTER — Inpatient Hospital Stay (HOSPITAL_COMMUNITY)
Admission: EM | Admit: 2021-10-21 | Discharge: 2021-10-26 | DRG: 812 | Disposition: A | Discharge status: Home Health | Payer: Medicare Other | Attending: Internal Medicine | Admitting: Internal Medicine

## 2021-10-21 ENCOUNTER — Observation Stay (HOSPITAL_COMMUNITY): Payer: Medicare Other

## 2021-10-21 DIAGNOSIS — D649 Anemia, unspecified: Secondary | ICD-10-CM | POA: Diagnosis not present

## 2021-10-21 DIAGNOSIS — E119 Type 2 diabetes mellitus without complications: Secondary | ICD-10-CM

## 2021-10-21 DIAGNOSIS — D696 Thrombocytopenia, unspecified: Secondary | ICD-10-CM | POA: Diagnosis present

## 2021-10-21 DIAGNOSIS — E871 Hypo-osmolality and hyponatremia: Secondary | ICD-10-CM | POA: Diagnosis present

## 2021-10-21 DIAGNOSIS — Z8673 Personal history of transient ischemic attack (TIA), and cerebral infarction without residual deficits: Secondary | ICD-10-CM

## 2021-10-21 DIAGNOSIS — Z794 Long term (current) use of insulin: Secondary | ICD-10-CM

## 2021-10-21 DIAGNOSIS — Z923 Personal history of irradiation: Secondary | ICD-10-CM

## 2021-10-21 DIAGNOSIS — Z9012 Acquired absence of left breast and nipple: Secondary | ICD-10-CM

## 2021-10-21 DIAGNOSIS — Z853 Personal history of malignant neoplasm of breast: Secondary | ICD-10-CM

## 2021-10-21 DIAGNOSIS — E785 Hyperlipidemia, unspecified: Secondary | ICD-10-CM | POA: Diagnosis present

## 2021-10-21 DIAGNOSIS — Z79899 Other long term (current) drug therapy: Secondary | ICD-10-CM

## 2021-10-21 DIAGNOSIS — Z9221 Personal history of antineoplastic chemotherapy: Secondary | ICD-10-CM

## 2021-10-21 DIAGNOSIS — I959 Hypotension, unspecified: Secondary | ICD-10-CM | POA: Diagnosis present

## 2021-10-21 DIAGNOSIS — I10 Essential (primary) hypertension: Secondary | ICD-10-CM | POA: Diagnosis present

## 2021-10-21 DIAGNOSIS — Z7984 Long term (current) use of oral hypoglycemic drugs: Secondary | ICD-10-CM

## 2021-10-21 DIAGNOSIS — F32A Depression, unspecified: Secondary | ICD-10-CM | POA: Diagnosis present

## 2021-10-21 DIAGNOSIS — E1165 Type 2 diabetes mellitus with hyperglycemia: Secondary | ICD-10-CM | POA: Diagnosis present

## 2021-10-21 DIAGNOSIS — D46Z Other myelodysplastic syndromes: Principal | ICD-10-CM | POA: Diagnosis present

## 2021-10-21 DIAGNOSIS — Z170 Estrogen receptor positive status [ER+]: Secondary | ICD-10-CM

## 2021-10-21 DIAGNOSIS — E876 Hypokalemia: Secondary | ICD-10-CM | POA: Diagnosis present

## 2021-10-21 DIAGNOSIS — Z833 Family history of diabetes mellitus: Secondary | ICD-10-CM

## 2021-10-21 DIAGNOSIS — D61818 Other pancytopenia: Secondary | ICD-10-CM

## 2021-10-21 DIAGNOSIS — C50212 Malignant neoplasm of upper-inner quadrant of left female breast: Secondary | ICD-10-CM

## 2021-10-21 DIAGNOSIS — Z91148 Patient's other noncompliance with medication regimen for other reason: Secondary | ICD-10-CM

## 2021-10-21 DIAGNOSIS — F419 Anxiety disorder, unspecified: Secondary | ICD-10-CM | POA: Diagnosis present

## 2021-10-21 DIAGNOSIS — E1169 Type 2 diabetes mellitus with other specified complication: Secondary | ICD-10-CM

## 2021-10-21 LAB — POC OCCULT BLOOD, ED: Fecal Occult Bld: NEGATIVE

## 2021-10-21 LAB — COMPREHENSIVE METABOLIC PANEL
ALT: 10 U/L (ref 0–44)
AST: 14 U/L — ABNORMAL LOW (ref 15–41)
Albumin: 3.3 g/dL — ABNORMAL LOW (ref 3.5–5.0)
Alkaline Phosphatase: 116 U/L (ref 38–126)
Anion gap: 8 (ref 5–15)
BUN: 13 mg/dL (ref 8–23)
CO2: 24 mmol/L (ref 22–32)
Calcium: 8.7 mg/dL — ABNORMAL LOW (ref 8.9–10.3)
Chloride: 98 mmol/L (ref 98–111)
Creatinine, Ser: 0.57 mg/dL (ref 0.44–1.00)
GFR, Estimated: >60 mL/min (ref >60)
Glucose, Bld: 404 mg/dL — ABNORMAL HIGH (ref 70–99)
Potassium: 3.4 mmol/L — ABNORMAL LOW (ref 3.5–5.1)
Sodium: 130 mmol/L — ABNORMAL LOW (ref 135–145)
Total Bilirubin: 0.8 mg/dL (ref 0.3–1.2)
Total Protein: 8.5 g/dL — ABNORMAL HIGH (ref 6.5–8.1)

## 2021-10-21 LAB — CBC
HCT: 17.5 % — ABNORMAL LOW (ref 36.0–46.0)
Hemoglobin: 5.9 g/dL — CL (ref 12.0–15.0)
MCH: 35.8 pg — ABNORMAL HIGH (ref 26.0–34.0)
MCHC: 33.7 g/dL (ref 30.0–36.0)
MCV: 106.1 fL — ABNORMAL HIGH (ref 80.0–100.0)
Platelets: 54 10*3/uL — ABNORMAL LOW (ref 150–400)
RBC: 1.65 MIL/uL — ABNORMAL LOW (ref 3.87–5.11)
RDW: 15.9 % — ABNORMAL HIGH (ref 11.5–15.5)
WBC: 4.5 10*3/uL (ref 4.0–10.5)
nRBC: 0 % (ref 0.0–0.2)

## 2021-10-21 LAB — IRON AND TIBC
Iron: 128 ug/dL (ref 28–170)
Saturation Ratios: 31 % (ref 10.4–31.8)
TIBC: 416 ug/dL (ref 250–450)
UIBC: 288 ug/dL

## 2021-10-21 LAB — RETICULOCYTES
Immature Retic Fract: 13.8 % (ref 2.3–15.9)
RBC.: 1.5 MIL/uL — ABNORMAL LOW (ref 3.87–5.11)
Retic Count, Absolute: 15 10*3/uL — ABNORMAL LOW (ref 19.0–186.0)
Retic Ct Pct: 1 % (ref 0.4–3.1)

## 2021-10-21 LAB — PREPARE RBC (CROSSMATCH)

## 2021-10-21 LAB — GLUCOSE, CAPILLARY
Glucose-Capillary: 301 mg/dL — ABNORMAL HIGH (ref 70–99)
Glucose-Capillary: 292 mg/dL — ABNORMAL HIGH (ref 70–99)

## 2021-10-21 LAB — FERRITIN: Ferritin: 191 ng/mL (ref 11–307)

## 2021-10-21 LAB — FOLATE: Folate: 24.4 ng/mL (ref >5.9)

## 2021-10-21 LAB — MAGNESIUM: Magnesium: 1.6 mg/dL — ABNORMAL LOW (ref 1.7–2.4)

## 2021-10-21 LAB — ABO/RH: ABO/RH(D): O POS

## 2021-10-21 LAB — VITAMIN B12: Vitamin B-12: 828 pg/mL (ref 180–914)

## 2021-10-21 MED ORDER — POTASSIUM CHLORIDE CRYS ER 20 MEQ PO TBCR
40.0000 meq | EXTENDED_RELEASE_TABLET | Freq: Every day | ORAL | Status: DC
  Administered 2021-10-21 – 2021-10-26 (×6): 40 meq via ORAL
  Filled 2021-10-21 (×6): qty 2

## 2021-10-21 MED ORDER — SODIUM CHLORIDE 0.9% IV SOLUTION: Freq: Once | INTRAVENOUS | Status: AC

## 2021-10-21 MED ORDER — IOHEXOL 300 MG/ML  SOLN
100.0000 mL | Freq: Once | INTRAMUSCULAR | Status: AC | PRN
  Administered 2021-10-21: 100 mL via INTRAVENOUS

## 2021-10-21 MED ORDER — IOHEXOL 9 MG/ML PO SOLN
500.0000 mL | ORAL | Status: AC
  Administered 2021-10-21: 500 mL via ORAL

## 2021-10-21 MED ORDER — ONDANSETRON HCL 4 MG/2ML IJ SOLN: 4.0000 mg | Freq: Four times a day (QID) | INTRAMUSCULAR | Status: DC | PRN

## 2021-10-21 MED ORDER — IOHEXOL 9 MG/ML PO SOLN
ORAL | Status: AC
  Administered 2021-10-21: 500 mL via ORAL
  Filled 2021-10-21: qty 1000

## 2021-10-21 MED ORDER — SODIUM CHLORIDE 0.9 % IV SOLN: INTRAVENOUS | Status: DC

## 2021-10-21 MED ORDER — ONDANSETRON HCL 4 MG PO TABS: 4.0000 mg | ORAL_TABLET | Freq: Four times a day (QID) | ORAL | Status: DC | PRN

## 2021-10-21 MED ORDER — INSULIN ASPART 100 UNIT/ML IJ SOLN
0.0000 [IU] | Freq: Three times a day (TID) | INTRAMUSCULAR | Status: DC
  Administered 2021-10-21: 11 [IU] via SUBCUTANEOUS
  Administered 2021-10-22: 5 [IU] via SUBCUTANEOUS
  Administered 2021-10-22 (×2): 8 [IU] via SUBCUTANEOUS
  Administered 2021-10-23: 11 [IU] via SUBCUTANEOUS
  Administered 2021-10-23: 15 [IU] via SUBCUTANEOUS
  Administered 2021-10-23: 5 [IU] via SUBCUTANEOUS
  Administered 2021-10-24 (×3): 8 [IU] via SUBCUTANEOUS
  Administered 2021-10-25: 2 [IU] via SUBCUTANEOUS
  Administered 2021-10-25: 8 [IU] via SUBCUTANEOUS
  Administered 2021-10-25: 3 [IU] via SUBCUTANEOUS
  Administered 2021-10-26 (×2): 2 [IU] via SUBCUTANEOUS
  Filled 2021-10-21: qty 0.15

## 2021-10-21 MED ORDER — INSULIN ASPART 100 UNIT/ML IJ SOLN
0.0000 [IU] | Freq: Every day | INTRAMUSCULAR | Status: DC
  Administered 2021-10-21 – 2021-10-22 (×2): 3 [IU] via SUBCUTANEOUS
  Administered 2021-10-23 – 2021-10-24 (×2): 4 [IU] via SUBCUTANEOUS
  Administered 2021-10-25: 3 [IU] via SUBCUTANEOUS
  Filled 2021-10-21: qty 0.05

## 2021-10-21 NOTE — ED Provider Triage Note (Signed)
Emergency Medicine Provider Triage Evaluation Note  Amanda Duarte , a 66 y.o. female  was evaluated in triage.  Pt complains of weakness. Weak x 1 weeks, having weight loss of 12lbs x 3 months.  No hematochezia, melena, fever, cough.  Her PCP sent here for concerns of anemia  Review of Systems  Positive: As above Negative: As above  Physical Exam  BP (!) 133/50 (BP Location: Left Arm)   Pulse (!) 103   Temp 98.7 F (37.1 C)   Resp 16   SpO2 100%  Gen:   Awake, no distress   Resp:  Normal effort  MSK:   Moves extremities without difficulty  Other:    Medical Decision Making  Medically screening exam initiated at 12:01 PM.  Appropriate orders placed.  MADIE CAHN was informed that the remainder of the evaluation will be completed by another provider, this initial triage assessment does not replace that evaluation, and the importance of remaining in the ED until their evaluation is complete.     Domenic Moras, PA-C 10/21/21 1202

## 2021-10-21 NOTE — H&P (Signed)
History and Physical    Patient: Amanda Duarte DOB: January 14, 1956 DOA: 10/21/2021 DOS: the patient was seen and examined on 10/21/2021 PCP: [name], NP  Patient coming from: Home  Chief Complaint:  Chief Complaint  Patient presents with   Abnormal Lab   HPI: Amanda Duarte is a 66 y.o. female with medical history significant of HTN, HLD, anemia, anxiety/depression, DM2. Presenting with symptomatic anemia. She reports that she's had fatigue and intermittent shortness of breath for the last week. She has not had any chest pain, cough, fever or sick contacts. She has been following with her PCP about this matter. So labs were drawn during her last visit. It was noted that she was significantly anemic. It was recommended that she come to the ED for evaluation. She denies any hematochezia, hematemesis, hematuria. She denies any other aggravating or alleviating factors.   Review of Systems: As mentioned in the history of present illness. All other systems reviewed and are negative. Past Medical History:  Diagnosis Date   Cancer ([place]) 03/2017   left breast cancer   Diabetes mellitus without complication ([place])    Hyperlipidemia    Hypertension    Malignant neoplasm of left breast ([place])    Stroke (cerebrum) ([place])    Tibial plateau fracture, left    04-13-17 had ORIF   Past Surgical History:  Procedure Laterality Date   MASTECTOMY Left 2019   MASTECTOMY MODIFIED RADICAL Left 11/20/2017   Procedure: LEFT MODIFIED RADICAL MASTECTOMY;  Surgeon: Fanny Skates, MD;  Location: [facility];  Service: General;  Laterality: Left;   ORIF TIBIA PLATEAU Left 04/13/2017   Procedure: OPEN REDUCTION INTERNAL FIXATION (ORIF) TIBIAL PLATEAU;  Surgeon: Shona Needles, MD;  Location: [facility];  Service: Orthopedics;  Laterality: Left;   PORT-A-CATH REMOVAL Right 11/20/2017   Procedure: REMOVAL PORT-A-CATH;  Surgeon: Fanny Skates, MD;  Location: [facility];  Service: General;  Laterality: Right;    PORTACATH PLACEMENT Right 05/31/2017   Procedure: INSERTION PORT-A-CATH;  Surgeon: Fanny Skates, MD;  Location: [facility];  Service: General;  Laterality: Right;   Social History:  reports that she has never smoked. She has never used smokeless tobacco. She reports that she does not drink alcohol and does not use drugs.  Allergies  Allergen Reactions   Victoza [Liraglutide] Nausea And Vomiting    Family History  Problem Relation Age of Onset   Heart Problems Mother    Diabetes Son    Breast cancer Neg Hx    Colon cancer Neg Hx    Rectal cancer Neg Hx    Stomach cancer Neg Hx    Esophageal cancer Neg Hx     Prior to Admission medications   Medication Sig Start Date End Date Taking? Authorizing Provider  Accu-Chek Softclix Lancets lancets Use to check blood sugar 3 times daily. E11.65 02/15/21   [name], NP  atorvastatin (LIPITOR) 40 MG tablet Take 1 tablet (40 mg total) by mouth daily. 10/05/21   [name], NP  Blood Glucose Monitoring Suppl (ACCU-CHEK GUIDE) w/Device KIT use kit to check blood glucose three times daily 02/15/21   [name], NP  Blood Pressure Monitor DEVI Please provide patient with insurance approved blood pressure monitor 01/14/19   [name], NP  citalopram (CELEXA) 20 MG tablet Take 1 tablet (20 mg total) by mouth daily. For depression 10/05/21   [name], NP  fenofibrate (TRICOR) 145 MG tablet Take 1 tablet (  145 mg total) by mouth daily. 10/05/21   [name], NP  glimepiride (AMARYL) 4 MG tablet Take 1 tablet (4 mg total) by mouth in the morning and at bedtime. 10/05/21   [name], NP  glucose blood (ACCU-CHEK GUIDE) test strip Use to check blood sugar 3 times daily. E11.65 02/15/21   [name], NP  hydrochlorothiazide (HYDRODIURIL) 25 MG tablet Take 1 tablet (25 mg total) by mouth daily. Please make an appointment with [facility]! 10/05/21   [name], NP  hydrOXYzine (ATARAX) 10 MG tablet  Take 1 tablet (10 mg total) by mouth 3 (three) times daily as needed for anxiety. 10/05/21   [name], NP  insulin glargine (LANTUS) 100 UNIT/ML Solostar Pen Inject 20 Units into the skin 2 (two) times daily. 10/05/21   [name], NP  insulin lispro (HUMALOG) 100 UNIT/ML injection Inject 0.04 mLs (4 Units total) into the skin 3 (three) times daily before meals. 02/17/21   [name], NP  Insulin Pen Needle 31G X 5 MM MISC USE AS INSTRUCTED. INJECT INTO THE SKIN ONCE NIGHTLY. 02/15/21 02/15/22  [name], NP  Insulin Syringe-Needle U-100 30G X 5/16" 1 ML MISC Use as directed to inject into the skin 3 times daily. 02/17/21   [name], NP  losartan (COZAAR) 50 MG tablet Take 1 tablet (50 mg total) by mouth daily. 10/05/21   [name], NP  Na Sulfate-K Sulfate-Mg Sulf (SUPREP BOWEL PREP KIT) 17.5-3.13-1.6 GM/177ML SOLN Take 1 kit by mouth as directed. For colonoscopy prep 08/16/19   Thornton Park, MD  potassium chloride SA (KLOR-CON M) 20 MEQ tablet Take 1 tablet (20 mEq total) by mouth daily. 10/05/21   [name], NP  sitaGLIPtin-metformin (JANUMET) 50-1000 MG tablet TAKE 1 TABLET BY MOUTH 2 TIMES DAILY 10/05/21   [name], NP  triamcinolone cream (KENALOG) 0.1 % Apply 1 application topically 2 (two) times daily. 01/05/21   Mayers, Loraine Grip, PA-C    Physical Exam: Vitals:   10/21/21 1139 10/21/21 1245 10/21/21 1300 10/21/21 1345  BP: (!) 133/50 (!) 108/51 (!) 104/53 (!) 111/58  Pulse: (!) 103 77 73 77  Resp: 16 12 10 18   Temp: 98.7 F (37.1 C)     SpO2: 100% 100% 100% 98%   General: 66 y.o. female resting in bed in NAD Eyes: PERRL, normal sclera ENMT: Nares patent w/o discharge, orophaynx clear, dentition normal, ears w/o discharge/lesions/ulcers Neck: Supple, trachea midline Cardiovascular: RRR, +S1, S2, no m/g/r, equal pulses throughout Respiratory: CTABL, no w/r/r, normal WOB GI: BS+, NDNT, no masses noted, no organomegaly noted MSK: No  e/c/c Neuro: A&O x 3, no focal deficits Psyc: Appropriate interaction and affect, calm/cooperative  Data Reviewed:  Results for orders placed or performed during the hospital encounter of 10/21/21 (from the past 24 hour(s))  Comprehensive metabolic panel     Status: Abnormal   Collection Time: 10/21/21 12:40 PM  Result Value Ref Range   Sodium 130 (L) 135 - 145 mmol/L   Potassium 3.4 (L) 3.5 - 5.1 mmol/L   Chloride 98 98 - 111 mmol/L   CO2 24 22 - 32 mmol/L   Glucose, Bld 404 (H) 70 - 99 mg/dL   BUN 13 8 - 23 mg/dL   Creatinine, Ser 0.57 0.44 - 1.00 mg/dL   Calcium 8.7 (L) 8.9 - 10.3 mg/dL   Total Protein 8.5 (H) 6.5 - 8.1 g/dL   Albumin 3.3 (L) 3.5 -  5.0 g/dL   AST 14 (L) 15 - 41 U/L   ALT 10 0 - 44 U/L   Alkaline Phosphatase 116 38 - 126 U/L   Total Bilirubin 0.8 0.3 - 1.2 mg/dL   GFR, Estimated >60 >60 mL/min   Anion gap 8 5 - 15  CBC     Status: Abnormal   Collection Time: 10/21/21 12:40 PM  Result Value Ref Range   WBC 4.5 4.0 - 10.5 K/uL   RBC 1.65 (L) 3.87 - 5.11 MIL/uL   Hemoglobin 5.9 (LL) 12.0 - 15.0 g/dL   HCT 17.5 (L) 36.0 - 46.0 %   MCV 106.1 (H) 80.0 - 100.0 fL   MCH 35.8 (H) 26.0 - 34.0 pg   MCHC 33.7 30.0 - 36.0 g/dL   RDW 15.9 (H) 11.5 - 15.5 %   Platelets 54 (L) 150 - 400 K/uL   nRBC 0.0 0.0 - 0.2 %  Type and screen [place]     Status: None (Preliminary result)   Collection Time: 10/21/21 12:40 PM  Result Value Ref Range   ABO/RH(D) PENDING    Antibody Screen PENDING    Sample Expiration      10/24/2021,2359 Performed at [facility], [place] [address], [place], [place] [address]   Vitamin B12     Status: None   Collection Time: 10/21/21 12:40 PM  Result Value Ref Range   Vitamin B-12 828 180 - 914 pg/mL  Iron and TIBC     Status: None   Collection Time: 10/21/21 12:40 PM  Result Value Ref Range   Iron 128 28 - 170 ug/dL   TIBC 416 250 - 450 ug/dL   Saturation Ratios 31 10.4 - 31.8 %   UIBC 288 ug/dL   Ferritin     Status: None   Collection Time: 10/21/21 12:40 PM  Result Value Ref Range   Ferritin 191 11 - 307 ng/mL  Reticulocytes     Status: Abnormal   Collection Time: 10/21/21 12:40 PM  Result Value Ref Range   Retic Ct Pct 1.0 0.4 - 3.1 %   RBC. 1.50 (L) 3.87 - 5.11 MIL/uL   Retic Count, Absolute 15.0 (L) 19.0 - 186.0 K/uL   Immature Retic Fract 13.8 2.3 - 15.9 %  POC occult blood, ED     Status: None   Collection Time: 10/21/21 12:53 PM  Result Value Ref Range   Fecal Occult Bld NEGATIVE NEGATIVE   Assessment and Plan: Pancytopenia Symptomatic anemia     - place in obs, tele     - iron studies noted     - negative FOBT     - EDP ordered 2 units pRBCs; trend H&H     - not sure why she's pancytopenic; check HIV, hep panel, EBV     - spoke with onco ([name]), she will follow, appreciate assistance  Hypokalemia Hyponatremia     - replace K+, check Mg2+     - fluids, follow Na+     - hold HCTZ  DM2     - A1c on 9/12 was 13.3, SSI, DM diet, glucose checks  Anxiety Depression     - continue home regimen  Hx of HTN     - holding HCTZ as she is hypotensive right now     - fluids, follow  Hx of [place]     - continue outpt follow up with Dr Burr Medico  Advance Care Planning:   Code  Status:FULL  Consults: Heme-onc ([name])  Family Communication: None at bedside  Severity of Illness: The appropriate patient status for this patient is OBSERVATION. Observation status is judged to be reasonable and necessary in order to provide the required intensity of service to ensure the patient's safety. The patient's presenting symptoms, physical exam findings, and initial radiographic and laboratory data in the context of their medical condition is felt to place them at decreased risk for further clinical deterioration. Furthermore, it is anticipated that the patient will be medically stable for discharge from the hospital within 2 midnights of admission.   Time spent in  coordination of this H&P: 50 minutes  Author: Jonnie Finner, DO 10/21/2021 2:11 PM  For on call review [email or web address].

## 2021-10-21 NOTE — ED Triage Notes (Signed)
Pt was referred to ED by PCP office with abnormal lab stating that she needs blood. Pt denies any bloody emesis, hematochezia, melena. Pt is not anticoagulated on blood thinners. Pt states she has been having worsening dizziness and fatigue for two weeks.

## 2021-10-21 NOTE — ED Provider Notes (Signed)
[facility] DEPT Provider Note   CSN: [id number] Arrival date & time: 10/21/21  1033     History  Chief Complaint  Patient presents with   Abnormal Lab    Amanda Duarte is a 66 y.o. female.  66 yo F with a chief complaints of fatigue.  This been going on for at least 3 months now.  She tells me that she had been seen by her family doctor and they checked her blood levels and they were low.  She came here for evaluation.  She denies any dark stool or blood in her stool.  Denies abdominal pain.  Denies nausea or vomiting.  Has a remote history of breast cancer and is status post mastectomy.   Abnormal Lab      Home Medications Prior to Admission medications   Medication Sig Start Date End Date Taking? Authorizing Provider  Accu-Chek Softclix Lancets lancets Use to check blood sugar 3 times daily. E11.65 02/15/21   [name], NP  atorvastatin (LIPITOR) 40 MG tablet Take 1 tablet (40 mg total) by mouth daily. 10/05/21   [name], NP  Blood Glucose Monitoring Suppl (ACCU-CHEK GUIDE) w/Device KIT use kit to check blood glucose three times daily 02/15/21   [name], NP  Blood Pressure Monitor DEVI Please provide patient with insurance approved blood pressure monitor 01/14/19   [name], NP  citalopram (CELEXA) 20 MG tablet Take 1 tablet (20 mg total) by mouth daily. For depression 10/05/21   [name], NP  fenofibrate (TRICOR) 145 MG tablet Take 1 tablet (145 mg total) by mouth daily. 10/05/21   [name], NP  glimepiride (AMARYL) 4 MG tablet Take 1 tablet (4 mg total) by mouth in the morning and at bedtime. 10/05/21   [name], NP  glucose blood (ACCU-CHEK GUIDE) test strip Use to check blood sugar 3 times daily. E11.65 02/15/21   [name], NP  hydrochlorothiazide (HYDRODIURIL) 25 MG tablet Take 1 tablet (25 mg total) by mouth daily. Please make an appointment with [facility]! 10/05/21   [name], NP   hydrOXYzine (ATARAX) 10 MG tablet Take 1 tablet (10 mg total) by mouth 3 (three) times daily as needed for anxiety. 10/05/21   [name], NP  insulin glargine (LANTUS) 100 UNIT/ML Solostar Pen Inject 20 Units into the skin 2 (two) times daily. 10/05/21   [name], NP  insulin lispro (HUMALOG) 100 UNIT/ML injection Inject 0.04 mLs (4 Units total) into the skin 3 (three) times daily before meals. 02/17/21   [name], NP  Insulin Pen Needle 31G X 5 MM MISC USE AS INSTRUCTED. INJECT INTO THE SKIN ONCE NIGHTLY. 02/15/21 02/15/22  [name], NP  Insulin Syringe-Needle U-100 30G X 5/16" 1 ML MISC Use as directed to inject into the skin 3 times daily. 02/17/21   [name], NP  losartan (COZAAR) 50 MG tablet Take 1 tablet (50 mg total) by mouth daily. 10/05/21   [name], NP  Na Sulfate-K Sulfate-Mg Sulf (SUPREP BOWEL PREP KIT) 17.5-3.13-1.6 GM/177ML SOLN Take 1 kit by mouth as directed. For colonoscopy prep 08/16/19   Thornton Park, MD  potassium chloride SA (KLOR-CON M) 20 MEQ tablet Take 1 tablet (20 mEq total) by mouth daily. 10/05/21   [name], NP  sitaGLIPtin-metformin (JANUMET) 50-1000 MG tablet TAKE 1 TABLET BY MOUTH 2 TIMES DAILY 10/05/21   [name], NP  triamcinolone cream (KENALOG) 0.1 % Apply 1 application topically 2 (two) times daily. 01/05/21   Mayers, Cari S, PA-C      Allergies    Victoza [liraglutide]    Review of Systems   Review of Systems  Physical Exam Updated Vital Signs BP (!) 111/58   Pulse 77   Temp 98.7 F (37.1 C)   Resp 18   SpO2 98%  Physical Exam Vitals and nursing note reviewed.  Constitutional:      General: She is not in acute distress.    Appearance: She is well-developed. She is not diaphoretic.  HENT:     Head: Normocephalic and atraumatic.  Eyes:     Pupils: Pupils are equal, round, and reactive to light.  Cardiovascular:     Rate and Rhythm: Normal rate and regular rhythm.     Heart sounds: No  murmur heard.    No friction rub. No gallop.  Pulmonary:     Effort: Pulmonary effort is normal.     Breath sounds: No wheezing or rales.  Abdominal:     General: There is no distension.     Palpations: Abdomen is soft.     Tenderness: There is no abdominal tenderness.  Musculoskeletal:        General: No tenderness.     Cervical back: Normal range of motion and neck supple.     Comments: No hemorrhoids, orange stool.  Skin:    General: Skin is warm and dry.  Neurological:     Mental Status: She is alert and oriented to person, place, and time.  Psychiatric:        Behavior: Behavior normal.     ED Results / Procedures / Treatments   Labs (all labs ordered are listed, but only abnormal results are displayed) Labs Reviewed  COMPREHENSIVE METABOLIC PANEL - Abnormal; Notable for the following components:      Result Value   Sodium 130 (*)    Potassium 3.4 (*)    Glucose, Bld 404 (*)    Calcium 8.7 (*)    Total Protein 8.5 (*)    Albumin 3.3 (*)    AST 14 (*)    All other components within normal limits  CBC - Abnormal; Notable for the following components:   RBC 1.65 (*)    Hemoglobin 5.9 (*)    HCT 17.5 (*)    MCV 106.1 (*)    MCH 35.8 (*)    RDW 15.9 (*)    Platelets 54 (*)    All other components within normal limits  RETICULOCYTES - Abnormal; Notable for the following components:   RBC. 1.50 (*)    Retic Count, Absolute 15.0 (*)    All other components within normal limits  VITAMIN B12  IRON AND TIBC  FERRITIN  FOLATE  POC OCCULT BLOOD, ED  TYPE AND SCREEN  PREPARE RBC (CROSSMATCH)    EKG None  Radiology No results found.  Procedures Procedures    Medications Ordered in ED Medications  0.9 %  sodium chloride infusion (Manually program via Guardrails IV Fluids) (has no administration in time range)    ED Course/ Medical Decision Making/ A&P                           Medical Decision Making Amount and/or Complexity of Data Reviewed Labs:  ordered.  Risk Prescription drug management.   66 yo F with a chief complaints of acute anemia.  This was noted in her doctor's office, she was encouraged to come to the hospital and over the past week she has had progressive fatigue and so came here for evaluation.  I reviewed the patient's records, at that time her hemoglobin was 6.9.  We will recheck here.  Repeat hemoglobin of 5.9.  Platelets are also low.  White blood cell count technically is normal but on the low end of normal.  Ordered 2 units of blood for transfusion.  Discussed the case with the hospitalist for admission.  CRITICAL CARE Performed by: Cecilio Asper   Total critical care time: 35 minutes  Critical care time was exclusive of separately billable procedures and treating other patients.  Critical care was necessary to treat or prevent imminent or life-threatening deterioration.  Critical care was time spent personally by me on the following activities: development of treatment plan with patient and/or surrogate as well as nursing, discussions with consultants, evaluation of patient's response to treatment, examination of patient, obtaining history from patient or surrogate, ordering and performing treatments and interventions, ordering and review of laboratory studies, ordering and review of radiographic studies, pulse oximetry and re-evaluation of patient's condition.  The patients results and plan were reviewed and discussed.   Any x-rays performed were independently reviewed by myself.   Differential diagnosis were considered with the presenting HPI.  Medications  0.9 %  sodium chloride infusion (Manually program via Guardrails IV Fluids) (has no administration in time range)    Vitals:   10/21/21 1139 10/21/21 1245 10/21/21 1300 10/21/21 1345  BP: (!) 133/50 (!) 108/51 (!) 104/53 (!) 111/58  Pulse: (!) 103 77 73 77  Resp: _0 Temp: 98.7 F (37.1 C)     SpO2: 100% 100% 100% 98%    Final  diagnoses:  Symptomatic anemia    Admission/ observation were discussed with the admitting physician, patient and/or family and they are comfortable with the plan.         Final Clinical Impression(s) / ED Diagnoses Final diagnoses:  Symptomatic anemia    Rx / DC Orders ED Discharge Orders     None         Deno Etienne, DO 10/21/21 1412

## 2021-10-21 NOTE — Progress Notes (Signed)
LUKE RIGSBEE   DOB:[birth year]   UU#:[id number]   HKV#:[id number]  Oncology follow up   Subjective: Patient is well-known to me, under my care for her breast cancer.  She was last seen by me in March 2023, and her CBC was normal at that time.  She was admitted today for severe anemia and moderate thrombocytopenia.  Patient lost her son in February 2023, and is still in grief.  He reports progressive weakness in the past 2 weeks, no overt bleeding, no fever or infection.  She is getting blood transfusion when I saw her.  She denies any new pain, appetite is normal, no other new complaints.   Objective:  Vitals:   10/21/21 1550 10/21/21 1650  BP: (!) 114/53 (!) 109/53  Pulse:  72  Resp:  18  Temp: 98.4 F (36.9 C) 98.7 F (37.1 C)  SpO2:  100%    Body mass index is 21.69 kg/m. No intake or output data in the 24 hours ending 10/21/21 1732   Sclerae unicteric  Oropharynx clear  No peripheral adenopathy  Lungs clear -- no rales or rhonchi  Heart regular rate and rhythm  Abdomen benign  MSK no focal spinal tenderness, no peripheral edema  Neuro nonfocal  Breast exam: Status post left mastectomy, no palpable nodules on chest wall.  Right breast exam was unremarkable.  CBG (last 3)  Recent Labs    10/21/21 1651  GLUCAP 301*     Labs:   Urine Studies No results for input(s): "UHGB", "CRYS" in the last 72 hours.  Invalid input(s): "UACOL", "UAPR", "USPG", "UPH", "UTP", "UGL", "UKET", "UBIL", "UNIT", "UROB", "ULEU", "UEPI", "UWBC", "URBC", "UBAC", "CAST", "UCOM", "BILUA"  Basic Metabolic Panel: Recent Labs  Lab 10/21/21 1240  NA 130*  K 3.4*  CL 98  CO2 24  GLUCOSE 404*  BUN 13  CREATININE 0.57  CALCIUM 8.7*  MG 1.6*   GFR CrCl cannot be calculated (Unknown ideal weight.). Liver Function Tests: Recent Labs  Lab 10/21/21 1240  AST 14*  ALT 10  ALKPHOS 116  BILITOT 0.8  PROT 8.5*  ALBUMIN 3.3*   No results for input(s): "LIPASE", "AMYLASE" in the last 168  hours. No results for input(s): "AMMONIA" in the last 168 hours. Coagulation profile No results for input(s): "INR", "PROTIME" in the last 168 hours.  CBC: Recent Labs  Lab 10/21/21 1240  WBC 4.5  HGB 5.9*  HCT 17.5*  MCV 106.1*  PLT 54*   Cardiac Enzymes: No results for input(s): "CKTOTAL", "CKMB", "CKMBINDEX", "TROPONINI" in the last 168 hours. BNP: Invalid input(s): "POCBNP" CBG: Recent Labs  Lab 10/21/21 1651  GLUCAP 301*   D-Dimer No results for input(s): "DDIMER" in the last 72 hours. Hgb A1c No results for input(s): "HGBA1C" in the last 72 hours. Lipid Profile No results for input(s): "CHOL", "HDL", "LDLCALC", "TRIG", "CHOLHDL", "LDLDIRECT" in the last 72 hours. Thyroid function studies No results for input(s): "TSH", "T4TOTAL", "T3FREE", "THYROIDAB" in the last 72 hours.  Invalid input(s): "FREET3" Anemia work up Recent Labs    10/21/21 1240  VITAMINB12 828  FOLATE 24.4  FERRITIN 191  TIBC 416  IRON 128  RETICCTPCT 1.0   Microbiology No results found for this or any previous visit (from the past 240 hour(s)).    Studies:  No results found.  Assessment: 66 y.o. female  Severe anemia and moderate thrombocytopenia, macrocytic. History of stage III triple negative breast cancer, diagnosed in 2019, status post surgery, radiation and chemotherapy. Type 2 diabetes,  HTN and anxiety     Plan:  -Her anemia work-up showed a normal iron, K95 and folic acid, reticulocyte count is very low.  No overt bleeding. -She previously received chemotherapy including Adriamycin, which potentially can cause MDS and leukemia, I will send pathology blood smear review, and she will likely need a bone marrow biopsy -I will also order DIC panel.  Given her previous history of breast cancer, metastatic disease especially bone metastasis need to be ruled out, I will order CT chest, abdomen pelvis with contrast to be done tonight or tomorrow. -If CT negative, I will arrange  for marrow biopsy after discharge.  -Agree with blood transfusion -Okay to discharge if she is clinically stable after blood transfusion and CT, I will follow-up in my office next week.   [name], MD 10/21/2021  5:32 PM

## 2021-10-22 DIAGNOSIS — E876 Hypokalemia: Secondary | ICD-10-CM

## 2021-10-22 DIAGNOSIS — F419 Anxiety disorder, unspecified: Secondary | ICD-10-CM

## 2021-10-22 DIAGNOSIS — I10 Essential (primary) hypertension: Secondary | ICD-10-CM

## 2021-10-22 DIAGNOSIS — F32A Depression, unspecified: Secondary | ICD-10-CM | POA: Diagnosis not present

## 2021-10-22 DIAGNOSIS — D696 Thrombocytopenia, unspecified: Secondary | ICD-10-CM

## 2021-10-22 DIAGNOSIS — E871 Hypo-osmolality and hyponatremia: Secondary | ICD-10-CM

## 2021-10-22 DIAGNOSIS — C50212 Malignant neoplasm of upper-inner quadrant of left female breast: Secondary | ICD-10-CM

## 2021-10-22 DIAGNOSIS — D649 Anemia, unspecified: Secondary | ICD-10-CM | POA: Diagnosis not present

## 2021-10-22 DIAGNOSIS — Z170 Estrogen receptor positive status [ER+]: Secondary | ICD-10-CM

## 2021-10-22 LAB — BPAM RBC
Blood Product Expiration Date: 202310312359
Blood Product Expiration Date: 202311032359
ISSUE DATE / TIME: 202309281529
ISSUE DATE / TIME: 202309281825
Unit Type and Rh: 5100
Unit Type and Rh: 5100

## 2021-10-22 LAB — MAGNESIUM: Magnesium: 1.5 mg/dL — ABNORMAL LOW (ref 1.7–2.4)

## 2021-10-22 LAB — GLUCOSE, CAPILLARY
Glucose-Capillary: 223 mg/dL — ABNORMAL HIGH (ref 70–99)
Glucose-Capillary: 277 mg/dL — ABNORMAL HIGH (ref 70–99)
Glucose-Capillary: 265 mg/dL — ABNORMAL HIGH (ref 70–99)
Glucose-Capillary: 289 mg/dL — ABNORMAL HIGH (ref 70–99)

## 2021-10-22 LAB — TYPE AND SCREEN
ABO/RH(D): O POS
Antibody Screen: NEGATIVE
Unit division: 0
Unit division: 0

## 2021-10-22 LAB — CBC
HCT: 26.6 % — ABNORMAL LOW (ref 36.0–46.0)
HCT: 25.9 % — ABNORMAL LOW (ref 36.0–46.0)
Hemoglobin: 9 g/dL — ABNORMAL LOW (ref 12.0–15.0)
Hemoglobin: 8.8 g/dL — ABNORMAL LOW (ref 12.0–15.0)
MCH: 32.7 pg (ref 26.0–34.0)
MCH: 33.2 pg (ref 26.0–34.0)
MCHC: 33.8 g/dL (ref 30.0–36.0)
MCHC: 34 g/dL (ref 30.0–36.0)
MCV: 96.7 fL (ref 80.0–100.0)
MCV: 97.7 fL (ref 80.0–100.0)
Platelets: 47 10*3/uL — ABNORMAL LOW (ref 150–400)
Platelets: 42 10*3/uL — ABNORMAL LOW (ref 150–400)
RBC: 2.75 MIL/uL — ABNORMAL LOW (ref 3.87–5.11)
RBC: 2.65 MIL/uL — ABNORMAL LOW (ref 3.87–5.11)
RDW: 18.6 % — ABNORMAL HIGH (ref 11.5–15.5)
RDW: 19 % — ABNORMAL HIGH (ref 11.5–15.5)
WBC: 4.2 10*3/uL (ref 4.0–10.5)
WBC: 3.1 10*3/uL — ABNORMAL LOW (ref 4.0–10.5)
nRBC: 0.5 % — ABNORMAL HIGH (ref 0.0–0.2)
nRBC: 0 % (ref 0.0–0.2)

## 2021-10-22 LAB — DIC (DISSEMINATED INTRAVASCULAR COAGULATION)PANEL
D-Dimer, Quant: 1.3 ug/mL-FEU — ABNORMAL HIGH (ref 0.00–0.50)
Fibrinogen: 471 mg/dL (ref 210–475)
INR: 1.3 — ABNORMAL HIGH (ref 0.8–1.2)
Platelets: 48 10*3/uL — ABNORMAL LOW (ref 150–400)
Prothrombin Time: 15.6 seconds — ABNORMAL HIGH (ref 11.4–15.2)
Smear Review: NONE SEEN
aPTT: 26 seconds (ref 24–36)

## 2021-10-22 LAB — COMPREHENSIVE METABOLIC PANEL
ALT: 9 U/L (ref 0–44)
AST: 17 U/L (ref 15–41)
Albumin: 3 g/dL — ABNORMAL LOW (ref 3.5–5.0)
Alkaline Phosphatase: 84 U/L (ref 38–126)
Anion gap: 6 (ref 5–15)
BUN: 10 mg/dL (ref 8–23)
CO2: 24 mmol/L (ref 22–32)
Calcium: 8.7 mg/dL — ABNORMAL LOW (ref 8.9–10.3)
Chloride: 100 mmol/L (ref 98–111)
Creatinine, Ser: 0.5 mg/dL (ref 0.44–1.00)
GFR, Estimated: >60 mL/min (ref >60)
Glucose, Bld: 171 mg/dL — ABNORMAL HIGH (ref 70–99)
Potassium: 3.9 mmol/L (ref 3.5–5.1)
Sodium: 130 mmol/L — ABNORMAL LOW (ref 135–145)
Total Bilirubin: 0.6 mg/dL (ref 0.3–1.2)
Total Protein: 7.8 g/dL (ref 6.5–8.1)

## 2021-10-22 LAB — HIV ANTIBODY (ROUTINE TESTING W REFLEX): HIV Screen 4th Generation wRfx: NONREACTIVE

## 2021-10-22 LAB — HEPATITIS PANEL, ACUTE: Hep A IgM: NEGATIVE — AB

## 2021-10-22 LAB — T4, FREE: Free T4: 0.89 ng/dL (ref 0.61–1.12)

## 2021-10-22 LAB — IMMATURE PLATELET FRACTION: Immature Platelet Fraction: 28.6 % — ABNORMAL HIGH (ref 1.2–8.6)

## 2021-10-22 LAB — TSH: TSH: 1.995 u[IU]/mL (ref 0.350–4.500)

## 2021-10-22 LAB — HCV INTERPRETATION

## 2021-10-22 MED ORDER — HYDROXYZINE HCL 10 MG PO TABS: 10.0000 mg | ORAL_TABLET | Freq: Three times a day (TID) | ORAL | Status: DC | PRN

## 2021-10-22 MED ORDER — PANTOPRAZOLE SODIUM 40 MG PO TBEC
40.0000 mg | DELAYED_RELEASE_TABLET | Freq: Every day | ORAL | Status: DC
  Administered 2021-10-22 – 2021-10-26 (×4): 40 mg via ORAL
  Filled 2021-10-22 (×4): qty 1

## 2021-10-22 MED ORDER — ATORVASTATIN CALCIUM 40 MG PO TABS
40.0000 mg | ORAL_TABLET | Freq: Every day | ORAL | Status: DC
  Administered 2021-10-23 – 2021-10-26 (×4): 40 mg via ORAL
  Filled 2021-10-22 (×4): qty 1

## 2021-10-22 MED ORDER — CITALOPRAM HYDROBROMIDE 20 MG PO TABS
20.0000 mg | ORAL_TABLET | Freq: Every day | ORAL | Status: DC
  Administered 2021-10-23 – 2021-10-26 (×4): 20 mg via ORAL
  Filled 2021-10-22 (×4): qty 1

## 2021-10-22 MED ORDER — MAGNESIUM SULFATE 4 GM/100ML IV SOLN
4.0000 g | Freq: Once | INTRAVENOUS | Status: AC
  Administered 2021-10-22: 4 g via INTRAVENOUS
  Filled 2021-10-22: qty 100

## 2021-10-22 MED ORDER — INSULIN ASPART 100 UNIT/ML IJ SOLN
3.0000 [IU] | Freq: Three times a day (TID) | INTRAMUSCULAR | Status: DC
  Administered 2021-10-23: 3 [IU] via SUBCUTANEOUS

## 2021-10-22 MED ORDER — INSULIN GLARGINE-YFGN 100 UNIT/ML ~~LOC~~ SOLN
10.0000 [IU] | Freq: Every day | SUBCUTANEOUS | Status: DC
  Administered 2021-10-22: 10 [IU] via SUBCUTANEOUS
  Filled 2021-10-22: qty 0.1

## 2021-10-22 NOTE — Progress Notes (Signed)
Amanda Duarte   DOB:09-Nov-1955   WU#:[id number]   VOZ#:[id number]  Oncology follow up   Subjective: Patient feels better after blood transfusion, no bleeding or other complains.   Objective:  Vitals:   10/22/21 1306 10/22/21 1921  BP: 112/62 115/63  Pulse: 66 71  Resp: 18 16  Temp: 97.7 F (36.5 C) 98.5 F (36.9 C)  SpO2: 99% 97%    Body mass index is 21.69 kg/m.  Intake/Output Summary (Last 24 hours) at 10/22/2021 2137 Last data filed at 10/22/2021 0850 Gross per 24 hour  Intake 682 ml  Output --  Net 682 ml     Sclerae unicteric  Oropharynx clear  No peripheral adenopathy  Lungs clear -- no rales or rhonchi  Heart regular rate and rhythm  Abdomen benign  MSK no focal spinal tenderness, no peripheral edema  Neuro nonfocal    CBG (last 3)  Recent Labs    10/22/21 1129 10/22/21 1633 10/22/21 2134  GLUCAP 277* 265* 289*     Labs:   Urine Studies No results for input(s): "UHGB", "CRYS" in the last 72 hours.  Invalid input(s): "UACOL", "UAPR", "USPG", "UPH", "UTP", "UGL", "UKET", "UBIL", "UNIT", "UROB", "ULEU", "UEPI", "UWBC", "URBC", "UBAC", "CAST", "UCOM", "BILUA"  Basic Metabolic Panel: Recent Labs  Lab 10/21/21 1240 10/22/21 0037  NA 130* 130*  K 3.4* 3.9  CL 98 100  CO2 24 24  GLUCOSE 404* 171*  BUN 13 10  CREATININE 0.57 0.50  CALCIUM 8.7* 8.7*  MG 1.6* 1.5*   GFR CrCl cannot be calculated (Unknown ideal weight.). Liver Function Tests: Recent Labs  Lab 10/21/21 1240 10/22/21 0037  AST 14* 17  ALT 10 9  ALKPHOS 116 84  BILITOT 0.8 0.6  PROT 8.5* 7.8  ALBUMIN 3.3* 3.0*   No results for input(s): "LIPASE", "AMYLASE" in the last 168 hours. No results for input(s): "AMMONIA" in the last 168 hours. Coagulation profile Recent Labs  Lab 10/22/21 0037  INR 1.3*    CBC: Recent Labs  Lab 10/21/21 1240 10/22/21 0037 10/22/21 1400  WBC 4.5 4.2 3.1*  HGB 5.9* 9.0* 8.8*  HCT 17.5* 26.6* 25.9*  MCV 106.1* 96.7 97.7  PLT 54* 48*  47* 42*    Cardiac Enzymes: No results for input(s): "CKTOTAL", "CKMB", "CKMBINDEX", "TROPONINI" in the last 168 hours. BNP: Invalid input(s): "POCBNP" CBG: Recent Labs  Lab 10/21/21 2054 10/22/21 0741 10/22/21 1129 10/22/21 1633 10/22/21 2134  GLUCAP 292* 223* 277* 265* 289*   D-Dimer Recent Labs    10/22/21 0037  DDIMER 1.30*   Hgb A1c No results for input(s): "HGBA1C" in the last 72 hours. Lipid Profile No results for input(s): "CHOL", "HDL", "LDLCALC", "TRIG", "CHOLHDL", "LDLDIRECT" in the last 72 hours. Thyroid function studies Recent Labs    10/22/21 0037  TSH 1.995   Anemia work up Recent Labs    10/21/21 1240  VITAMINB12 828  FOLATE 24.4  FERRITIN 191  TIBC 416  IRON 128  RETICCTPCT 1.0   Microbiology No results found for this or any previous visit (from the past 240 hour(s)).    Studies:  CT CHEST ABDOMEN PELVIS W CONTRAST  Result Date: 10/21/2021 CLINICAL DATA:  History of breast cancer with new pancytopenia. Evaluate for cancer recurrence. EXAM: CT CHEST, ABDOMEN, AND PELVIS WITH CONTRAST TECHNIQUE: Multidetector CT imaging of the chest, abdomen and pelvis was performed following the standard protocol during bolus administration of intravenous contrast. RADIATION DOSE REDUCTION: This exam was performed according to the departmental dose-optimization program  which includes automated exposure control, adjustment of the mA and/or kV according to patient size and/or use of iterative reconstruction technique. CONTRAST:  196m OMNIPAQUE IOHEXOL 300 MG/ML  SOLN COMPARISON:  CT abdomen and pelvis 08/23/2019. CT of the chest 04/11/2017. FINDINGS: CT CHEST FINDINGS Cardiovascular: No significant vascular findings. Normal heart size. No pericardial effusion. There are atherosclerotic calcifications of the aorta. Mediastinum/Nodes: There is mild wall thickening of the distal esophagus. No enlarged mediastinal or hilar lymph nodes are seen. Visualized thyroid gland is within  normal limits. Lungs/Pleura: There is some reticular opacities in the anterior left upper lobe, likely postradiation changes. There are some patchy areas of ground-glass opacities in the left upper lobe/lung apex, new from prior. There is minimal atelectasis in the right lung base. No discrete pulmonary nodules are seen. No pleural effusion or pneumothorax. Musculoskeletal: Patient is status post left mastectomy. Left axillary surgical clips are present. There is some nonenlarged left chest wall lymph nodes. There also nonenlarged right axillary lymph nodes. No acute fracture or focal osseous lesion. CT ABDOMEN PELVIS FINDINGS Hepatobiliary: No focal liver abnormality is seen. No gallstones, gallbladder wall thickening, or biliary dilatation. Pancreas: Unremarkable. No pancreatic ductal dilatation or surrounding inflammatory changes. Spleen: Normal in size without focal abnormality. Adrenals/Urinary Tract: Adrenal glands are unremarkable. Kidneys are normal, without renal calculi, focal lesion, or hydronephrosis. Bladder is unremarkable. Stomach/Bowel: Stomach is within normal limits. Appendix appears normal. No evidence of bowel wall thickening, distention, or inflammatory changes. Vascular/Lymphatic: Aortic atherosclerosis. No enlarged abdominal or pelvic lymph nodes. Reproductive: Uterus and bilateral adnexa are unremarkable. Other: There is a small fat containing umbilical hernia. No ascites. Musculoskeletal: No acute or significant osseous findings. IMPRESSION: 1. New focal ground-glass opacities in the left upper lobe, likely infectious/inflammatory. Short-term follow-up CT recommended to confirm complete resolution and to exclude underlying neoplastic process. 2. No evidence for metastatic disease in the chest, abdomen or pelvis. 3. Status post left mastectomy and axillary lymph node dissection. 4. Mild wall thickening of the distal esophagus, nonspecific. Correlate clinically for esophagitis. Electronically  Signed   By: ARonney AstersM.D.   On: 10/21/2021 23:23    Assessment: 66y.o. female  Severe anemia and moderate thrombocytopenia, macrocytic. History of stage III triple negative breast cancer, diagnosed in 2019, status post surgery, radiation and chemotherapy. Type 2 diabetes, HTN and anxiety     Plan:  -I reviewed her CT scan from last night, which showed no evidence of metastasis.  Ground glass change in left upper lung is likely related to radiation. -Immature platelet fraction is high, supports sequestration, less likely bone marrow production issue. -DIC panel is very impressive  -plan to obtain bone marrow biopsy.  She is scheduled for October 16.  I will see if we can move up to next week. -Okay to discharge from heme-onc standpoint. I will f/u in office   YTruitt Merle MD 10/22/2021

## 2021-10-22 NOTE — Progress Notes (Addendum)
PROGRESS NOTE    NAOMEE Duarte  IRJ:[id number] DOB: Sep 10, 1955 DOA: 10/21/2021 PCP: [name], NP    Chief Complaint  Patient presents with   Abnormal Lab    Brief Narrative:  Patient is 66 year old female history of hypertension, hyperlipidemia, anemia, depression/anxiety, type 2 diabetes mellitus, history of breast cancer  being followed by hematology/oncology in the outpatient setting presenting to the ED with severe symptomatic anemia, moderate thrombocytopenia, FOBT negative.  Hematology/oncology consulted.  Patient transfused 2 units packed red blood cells.  CTA chest abdomen and pelvis obtained.   Assessment & Plan:   Principal Problem:   Symptomatic anemia Active Problems:   Essential hypertension   DM2 (diabetes mellitus, type 2) (HCC)   Malignant neoplasm of upper-inner quadrant of left breast in female, estrogen receptor positive (HCC)   Thrombocytopenia (HCC)   Hyponatremia   Hypokalemia   Anxiety   Depression  #1 severe symptomatic anemia/thrombocytopenia -Patient presenting generalized fatigue, intermittent shortness of breath, noted to have a severe symptomatic anemia with hemoglobin of 5.9 on admission, platelet count of 54. -FOBT done was negative. -Status posttransfusion 2 units packed red blood cells hemoglobin currently at 9.0 this morning. -Patient denies any overt bleeding. -See CT chest abdomen and pelvis done per hematology/oncology recommendations with new focal groundglass opacities left upper lobe likely infectious/inflammatory, short-term follow-up CT recommended, no evidence of metastatic disease in the chest abdomen or pelvis, status post left mastectomy and axillary lymph node dissection, mild wall thickening of the distal esophagus, nonspecific, correlate clinically with esophagitis. -Patient seen in consultation by hematology/oncology, [name] who notes that patient previously received chemotherapy including Adriamycin which could potentially  cause MDS and leukemia.  Blood smear review sent off.  Hematology/oncology, is followed by hematology/oncology patient may need a bone marrow biopsy which can be arranged in the outpatient setting. -Follow H&H.  2.  Poorly controlled diabetes mellitus type 2 -Hemoglobin A1c 13.3 (10/05/2021) -CBG noted at 223 this morning. -Hold oral hypoglycemic agents. -Start Semglee 10 units nightly, NovoLog 3 units 3 times daily with meals if eating greater than 30% meals, SSI.  3.  Hypokalemia/hyponatremia/hypomagnesemia -Patient noted to be on HCTZ prior to admission which we will discontinue. -Potassium currently at 3.9, sodium stable at 130. -Magnesium at 1.5. -Magnesium sulfate 4 g IV x1. -Repeat labs in the AM.  4.  Depression/anxiety -Resume home regimen Celexa, hydroxyzine.  5.  Hypertension -BP noted to be borderline on admission. -HCTZ on hold and likely will not resume on discharge due to hyponatremia. -Continue to hold ARB. -Saline lock IV fluids.  6.  History of breast cancer -Per oncology.    DVT prophylaxis: SCDs Code Status: Full Family Communication: Updated patient.  Updated daughter at bedside.   Disposition: Likely home in 24 hours if hemoglobin remains stable, continued clinical improvement.  Status is: Observation The patient remains OBS appropriate and will d/c before 2 midnights.   Consultants:  Hematology/oncology: [name] 10/21/2021  Procedures:  CT chest abdomen and pelvis 10/21/2021 Transfusion 2 units packed red blood cells 10/21/2021  Antimicrobials:  None   Subjective: Laying in bed.  Denies any chest pain.  No shortness of breath.  No abdominal pain.  Denies any lightheadedness and dizziness.  Overall feeling better than she did on admission.  Some improvement with generalized weakness.  Denies any overt bleeding.  Objective: Vitals:   10/21/21 2217 10/22/21 0046 10/22/21 0513 10/22/21 1306  BP: 133/74 (!) 120/59 106/72 112/62  Pulse: 78 80 68  66  Resp: 16 16 14 18   Temp: 98.5 F (36.9 C) 99.8 F (37.7 C) 98.5 F (36.9 C) 97.7 F (36.5 C)  TempSrc: Oral Oral Oral Oral  SpO2: 99% 95% 98% 99%  Height:        Intake/Output Summary (Last 24 hours) at 10/22/2021 1802 Last data filed at 10/22/2021 0850 Gross per 24 hour  Intake 998 ml  Output --  Net 998 ml   There were no vitals filed for this visit.  Examination:  General exam: Appears calm and comfortable  Respiratory system: Clear to auscultation.  No wheezes, no crackles, no rhonchi.  Fair air movement.  Speaking in full sentences.  Respiratory effort normal. Cardiovascular system: S1 & S2 heard, RRR. No JVD, murmurs, rubs, gallops or clicks. No pedal edema. Gastrointestinal system: Abdomen is nondistended, soft and nontender. No organomegaly or masses felt. Normal bowel sounds heard. Central nervous system: Alert and oriented. No focal neurological deficits. Extremities: Symmetric 5 x 5 power. Skin: No rashes, lesions or ulcers Psychiatry: Judgement and insight appear normal. Mood & affect appropriate.     Data Reviewed: I have personally reviewed following labs and imaging studies  CBC: Recent Labs  Lab 10/21/21 1240 10/22/21 0037 10/22/21 1400  WBC 4.5 4.2 3.1*  HGB 5.9* 9.0* 8.8*  HCT 17.5* 26.6* 25.9*  MCV 106.1* 96.7 97.7  PLT 54* 48*  47* 42*    Basic Metabolic Panel: Recent Labs  Lab 10/21/21 1240 10/22/21 0037  NA 130* 130*  K 3.4* 3.9  CL 98 100  CO2 24 24  GLUCOSE 404* 171*  BUN 13 10  CREATININE 0.57 0.50  CALCIUM 8.7* 8.7*  MG 1.6* 1.5*    GFR: CrCl cannot be calculated (Unknown ideal weight.).  Liver Function Tests: Recent Labs  Lab 10/21/21 1240 10/22/21 0037  AST 14* 17  ALT 10 9  ALKPHOS 116 84  BILITOT 0.8 0.6  PROT 8.5* 7.8  ALBUMIN 3.3* 3.0*    CBG: Recent Labs  Lab 10/21/21 1651 10/21/21 2054 10/22/21 0741 10/22/21 1129 10/22/21 1633  GLUCAP 301* 292* 223* 277* 265*     No results found for this  or any previous visit (from the past 240 hour(s)).       Radiology Studies: CT CHEST ABDOMEN PELVIS W CONTRAST  Result Date: 10/21/2021 CLINICAL DATA:  History of breast cancer with new pancytopenia. Evaluate for cancer recurrence. EXAM: CT CHEST, ABDOMEN, AND PELVIS WITH CONTRAST TECHNIQUE: Multidetector CT imaging of the chest, abdomen and pelvis was performed following the standard protocol during bolus administration of intravenous contrast. RADIATION DOSE REDUCTION: This exam was performed according to the departmental dose-optimization program which includes automated exposure control, adjustment of the mA and/or kV according to patient size and/or use of iterative reconstruction technique. CONTRAST:  137m OMNIPAQUE IOHEXOL 300 MG/ML  SOLN COMPARISON:  CT abdomen and pelvis 08/23/2019. CT of the chest 04/11/2017. FINDINGS: CT CHEST FINDINGS Cardiovascular: No significant vascular findings. Normal heart size. No pericardial effusion. There are atherosclerotic calcifications of the aorta. Mediastinum/Nodes: There is mild wall thickening of the distal esophagus. No enlarged mediastinal or hilar lymph nodes are seen. Visualized thyroid gland is within normal limits. Lungs/Pleura: There is some reticular opacities in the anterior left upper lobe, likely postradiation changes. There are some patchy areas of ground-glass opacities in the left upper lobe/lung apex, new from prior. There is minimal atelectasis in the right lung base. No discrete pulmonary nodules are seen. No pleural effusion or pneumothorax. Musculoskeletal: Patient is status  post left mastectomy. Left axillary surgical clips are present. There is some nonenlarged left chest wall lymph nodes. There also nonenlarged right axillary lymph nodes. No acute fracture or focal osseous lesion. CT ABDOMEN PELVIS FINDINGS Hepatobiliary: No focal liver abnormality is seen. No gallstones, gallbladder wall thickening, or biliary dilatation. Pancreas:  Unremarkable. No pancreatic ductal dilatation or surrounding inflammatory changes. Spleen: Normal in size without focal abnormality. Adrenals/Urinary Tract: Adrenal glands are unremarkable. Kidneys are normal, without renal calculi, focal lesion, or hydronephrosis. Bladder is unremarkable. Stomach/Bowel: Stomach is within normal limits. Appendix appears normal. No evidence of bowel wall thickening, distention, or inflammatory changes. Vascular/Lymphatic: Aortic atherosclerosis. No enlarged abdominal or pelvic lymph nodes. Reproductive: Uterus and bilateral adnexa are unremarkable. Other: There is a small fat containing umbilical hernia. No ascites. Musculoskeletal: No acute or significant osseous findings. IMPRESSION: 1. New focal ground-glass opacities in the left upper lobe, likely infectious/inflammatory. Short-term follow-up CT recommended to confirm complete resolution and to exclude underlying neoplastic process. 2. No evidence for metastatic disease in the chest, abdomen or pelvis. 3. Status post left mastectomy and axillary lymph node dissection. 4. Mild wall thickening of the distal esophagus, nonspecific. Correlate clinically for esophagitis. Electronically Signed   By: Ronney Asters M.D.   On: 10/21/2021 23:23        Scheduled Meds:  [START ON 10/23/2021] atorvastatin  40 mg Oral Daily   [START ON 10/23/2021] citalopram  20 mg Oral Daily   insulin aspart  0-15 Units Subcutaneous TID WC   insulin aspart  0-5 Units Subcutaneous QHS   [START ON 10/23/2021] insulin aspart  3 Units Subcutaneous TID WC   insulin glargine-yfgn  10 Units Subcutaneous QHS   pantoprazole  40 mg Oral Q0600   potassium chloride  40 mEq Oral Daily   Continuous Infusions:     LOS: 0 days    Time spent: 40 minutes    Irine Seal, MD [facility]   To contact the attending provider between 7A-7P or the covering provider during after hours 7P-7A, please log into the web site [email or web address] and access  using universal Early password for that web site. If you do not have the password, please call the hospital operator.  10/22/2021, 6:02 PM

## 2021-10-22 NOTE — Inpatient Diabetes Management (Signed)
Inpatient Diabetes Program Recommendations  AACE/ADA: New Consensus Statement on Inpatient Glycemic Control (2015)  Target Ranges:  Prepandial:   less than 140 mg/dL      Peak postprandial:   less than 180 mg/dL (1-2 hours)      Critically ill patients:  140 - 180 mg/dL   Lab Results  Component Value Date   GLUCAP 277 (H) 10/22/2021   HGBA1C 13.3 (A) 10/05/2021    Review of Glycemic Control  Diabetes history: DM2 Outpatient Diabetes medications: Lantus 20 BID, Humalog 4 units TID, Amaryl 4 mg BID, Janumet 50/1000 BID Current orders for Inpatient glycemic control: Novolog 0-15 units TID with meals and 0-5 HS  Inpatient Diabetes Program Recommendations:    Consider adding Semglee 10 units QHS, titrate if FBS > 180 mg/dL  Add Novolog 3 units TID with meals if eating > 50%  Spoke with pt at bedside regarding her diabetes and glucose control at home. Discussed HgbA1C of 13.3%  (not accurate with low H/H, blood transfusion) Discussed blood sugar goals. Pt states she checks blood sugars daily, and then said "sometimes." Asked about her home insulin doses and she reported she does not take Novolog and usually takes Lantus once/day, not BID. Has had lots of stress with death of her son. PCP is [facility] and last visit was 10/05/21. MD had increased Lantus to 20 units BID. Stressed importance of sending blood sugars to PCP for review. Instructed to check CBGs at least 3x/day. Pt denies hypoglycemia, reviewed s/s and treatment.   Being followed by Oncology. Ordered Living Well with Diabetes book in Spanish for review of basic principles.  Will continue to follow while inpatient.  Thank you. Lorenda Peck, RD, LDN, [place] Inpatient Diabetes Coordinator [phone]

## 2021-10-22 NOTE — TOC Initial Note (Signed)
Transition of Care [facility]) - Initial/Assessment Note    Patient Details  Name: Amanda Duarte MRN: [id number] Date of Birth: [birth year]  Transition of [facility]) CM/SW Contact:    Leeroy Cha, RN Phone Number: 10/22/2021, 8:05 AM  Clinical Narrative:                   Transition of Care [facility]) Screening Note   Patient Details  Name: Amanda Duarte Date of Birth: 1955/06/02   Transition of [facility]) CM/SW Contact:    Leeroy Cha, RN Phone Number: 10/22/2021, 8:05 AM    Transition of Care Department (TOC) has reviewed patient and no TOC needs have been identified at this time. We will continue to monitor patient advancement through interdisciplinary progression rounds. If new patient transition needs arise, please place a TOC consult.   Expected Discharge Plan: Home/Self Care Barriers to Discharge: Continued Medical Work up   Patient Goals and CMS Choice   CMS Medicare.gov Compare Post Acute Care list provided to:: Patient    Expected Discharge Plan and Services Expected Discharge Plan: Home/Self Care   Discharge Planning Services: CM Consult   Living arrangements for the past 2 months: Single Family Home                                      Prior Living Arrangements/Services Living arrangements for the past 2 months: Single Family Home Lives with:: Spouse Patient language and need for interpreter reviewed:: Yes (speaks spanish and some english) Do you feel safe going back to the place where you live?: Yes               Activities of Daily Living Home Assistive Devices/Equipment: None ADL Screening (condition at time of admission) Patient's cognitive ability adequate to safely complete daily activities?: Yes Is the patient deaf or have difficulty hearing?: No Does the patient have difficulty seeing, even when wearing glasses/contacts?: No Does the patient have difficulty concentrating, remembering, or making decisions?: No Patient able to  express need for assistance with ADLs?: Yes Does the patient have difficulty dressing or bathing?: No Independently performs ADLs?: Yes (appropriate for developmental age) Does the patient have difficulty walking or climbing stairs?: Yes Weakness of Legs: Both Weakness of Arms/Hands: Both  Permission Sought/Granted                  Emotional Assessment Appearance:: Appears stated age Attitude/Demeanor/Rapport: Engaged Affect (typically observed): Calm Orientation: : Oriented to Self, Oriented to Place, Oriented to  Time, Oriented to Situation Alcohol / Substance Use: Never Used Psych Involvement: No (comment)  Admission diagnosis:  Symptomatic anemia [D64.9] Patient Active Problem List   Diagnosis Date Noted   Symptomatic anemia 10/21/2021   Thrombocytopenia ([place]) 10/21/2021   Hyponatremia 10/21/2021   Hypokalemia 10/21/2021   Anxiety 10/21/2021   Depression 10/21/2021   Axillary mass 04/16/2021   Abdominal pain 11/20/2019   Vaginal atrophy 11/20/2019   Acute vulvitis 11/20/2019   Current moderate episode of major depressive disorder without prior episode ([place]) 02/08/2019   Mixed hyperglyceridemia 06/27/2018   Port-A-Cath in place 06/09/2017   Malignant neoplasm of upper-inner quadrant of left breast in female, estrogen receptor positive ([place]) 05/22/2017   Closed fracture of left tibial plateau    Axillary lymphadenopathy 04/12/2017   Closed fracture of lateral portion of left tibial plateau 04/11/2017   Chest pain, neg MI, neg stress  Nuc study, possible GI 05/19/2014   Essential hypertension 05/19/2014   DM2 (diabetes mellitus, type 2) ([place]) 05/19/2014   PCP:  [name], NP Pharmacy:   [facility], [address] [place] [address] Phone: [phone] Fax: [phone]     Social Determinants of Health (SDOH) Interventions    Readmission Risk Interventions   No data to  display

## 2021-10-23 DIAGNOSIS — D649 Anemia, unspecified: Secondary | ICD-10-CM | POA: Diagnosis not present

## 2021-10-23 DIAGNOSIS — F32A Depression, unspecified: Secondary | ICD-10-CM | POA: Diagnosis not present

## 2021-10-23 DIAGNOSIS — I10 Essential (primary) hypertension: Secondary | ICD-10-CM | POA: Diagnosis not present

## 2021-10-23 DIAGNOSIS — F419 Anxiety disorder, unspecified: Secondary | ICD-10-CM | POA: Diagnosis not present

## 2021-10-23 LAB — CBC WITH DIFFERENTIAL/PLATELET
Abs Immature Granulocytes: 0 10*3/uL (ref 0.00–0.07)
Basophils Absolute: 0 10*3/uL (ref 0.0–0.1)
Basophils Relative: 1 %
Eosinophils Absolute: 0.1 10*3/uL (ref 0.0–0.5)
Eosinophils Relative: 4 %
HCT: 25.9 % — ABNORMAL LOW (ref 36.0–46.0)
Hemoglobin: 8.8 g/dL — ABNORMAL LOW (ref 12.0–15.0)
Lymphocytes Relative: 55 %
Lymphs Abs: 1.9 10*3/uL (ref 0.7–4.0)
MCH: 33 pg (ref 26.0–34.0)
MCHC: 34 g/dL (ref 30.0–36.0)
MCV: 97 fL (ref 80.0–100.0)
Monocytes Absolute: 0.2 10*3/uL (ref 0.1–1.0)
Monocytes Relative: 6 %
Neutro Abs: 1.2 10*3/uL — ABNORMAL LOW (ref 1.7–7.7)
Neutrophils Relative %: 34 %
Platelets: 36 10*3/uL — ABNORMAL LOW (ref 150–400)
RBC: 2.67 MIL/uL — ABNORMAL LOW (ref 3.87–5.11)
RDW: 18.3 % — ABNORMAL HIGH (ref 11.5–15.5)
WBC: 3.4 10*3/uL — ABNORMAL LOW (ref 4.0–10.5)
nRBC: 0 % (ref 0.0–0.2)

## 2021-10-23 LAB — BASIC METABOLIC PANEL
Anion gap: 7 (ref 5–15)
BUN: 12 mg/dL (ref 8–23)
CO2: 24 mmol/L (ref 22–32)
Calcium: 8.3 mg/dL — ABNORMAL LOW (ref 8.9–10.3)
Chloride: 99 mmol/L (ref 98–111)
Creatinine, Ser: 0.51 mg/dL (ref 0.44–1.00)
GFR, Estimated: >60 mL/min (ref >60)
Glucose, Bld: 366 mg/dL — ABNORMAL HIGH (ref 70–99)
Potassium: 3.7 mmol/L (ref 3.5–5.1)
Sodium: 130 mmol/L — ABNORMAL LOW (ref 135–145)

## 2021-10-23 LAB — GLUCOSE, CAPILLARY
Glucose-Capillary: 303 mg/dL — ABNORMAL HIGH (ref 70–99)
Glucose-Capillary: 244 mg/dL — ABNORMAL HIGH (ref 70–99)
Glucose-Capillary: 311 mg/dL — ABNORMAL HIGH (ref 70–99)

## 2021-10-23 LAB — MAGNESIUM: Magnesium: 1.7 mg/dL (ref 1.7–2.4)

## 2021-10-23 MED ORDER — INSULIN GLARGINE-YFGN 100 UNIT/ML ~~LOC~~ SOLN
15.0000 [IU] | Freq: Every day | SUBCUTANEOUS | Status: DC
  Administered 2021-10-23: 15 [IU] via SUBCUTANEOUS
  Filled 2021-10-23: qty 0.15

## 2021-10-23 MED ORDER — GLUCERNA SHAKE PO LIQD
237.0000 mL | Freq: Three times a day (TID) | ORAL | Status: DC
  Administered 2021-10-23 – 2021-10-26 (×8): 237 mL via ORAL
  Filled 2021-10-23 (×11): qty 237

## 2021-10-23 MED ORDER — INSULIN GLARGINE-YFGN 100 UNIT/ML ~~LOC~~ SOLN
4.0000 [IU] | Freq: Once | SUBCUTANEOUS | Status: AC
  Administered 2021-10-23: 4 [IU] via SUBCUTANEOUS
  Filled 2021-10-23: qty 0.04

## 2021-10-23 MED ORDER — INSULIN ASPART 100 UNIT/ML IJ SOLN
6.0000 [IU] | Freq: Three times a day (TID) | INTRAMUSCULAR | Status: DC
  Administered 2021-10-23 – 2021-10-24 (×3): 6 [IU] via SUBCUTANEOUS

## 2021-10-23 MED ORDER — MAGNESIUM SULFATE 4 GM/100ML IV SOLN
4.0000 g | Freq: Once | INTRAVENOUS | Status: AC
  Administered 2021-10-23: 4 g via INTRAVENOUS
  Filled 2021-10-23: qty 100

## 2021-10-23 NOTE — Progress Notes (Addendum)
PROGRESS NOTE    Amanda Duarte  ZCH:[id number] DOB: 1955/03/26 DOA: 10/21/2021 PCP: [name], NP    Chief Complaint  Patient presents with   Abnormal Lab    Brief Narrative:  Patient is 66 year old female history of hypertension, hyperlipidemia, anemia, depression/anxiety, type 2 diabetes mellitus, history of breast cancer  being followed by hematology/oncology in the outpatient setting presenting to the ED with severe symptomatic anemia, moderate thrombocytopenia, FOBT negative.  Hematology/oncology consulted.  Patient transfused 2 units packed red blood cells.  CTA chest abdomen and pelvis obtained.   Assessment & Plan:   Principal Problem:   Symptomatic anemia Active Problems:   Essential hypertension   DM2 (diabetes mellitus, type 2) (HCC)   Malignant neoplasm of upper-inner quadrant of left breast in female, estrogen receptor positive (HCC)   Thrombocytopenia (HCC)   Hyponatremia   Hypokalemia   Anxiety   Depression  #1 severe symptomatic anemia/thrombocytopenia -Patient presenting generalized fatigue, intermittent shortness of breath, noted to have a severe symptomatic anemia with hemoglobin of 5.9 on admission, platelet count of 54. -FOBT done was negative. -Status posttransfusion 2 units packed red blood cells hemoglobin currently at 8.8 this morning. -Platelet count trending down currently at 36 from 42 from 48 from 54. -Patient denies any overt bleeding. -See CT chest abdomen and pelvis done per hematology/oncology recommendations with new focal groundglass opacities left upper lobe likely infectious/inflammatory, short-term follow-up CT recommended, no evidence of metastatic disease in the chest abdomen or pelvis, status post left mastectomy and axillary lymph node dissection, mild wall thickening of the distal esophagus, nonspecific, correlate clinically with esophagitis. -Patient seen in consultation by hematology/oncology, [name] who notes that patient  previously received chemotherapy including Adriamycin which could potentially cause MDS and leukemia.  Blood smear review sent off.  Hematology/oncology, is followed by hematology/oncology patient may need a bone marrow biopsy which can be arranged in the outpatient setting. -We will monitor due to platelets trending down for the next 24 hours. -Follow H&H.  2.  Poorly controlled diabetes mellitus type 2 -Hemoglobin A1c 13.3 (10/05/2021) -CBG noted at 303 this morning. -Continue to hold oral hypoglycemic agents. -Increase Semglee to 15 units daily, increase NovoLog meal coverage to 6 units 3 times daily with meals.  SSI.    3.  Hypokalemia/hyponatremia/hypomagnesemia -Patient noted to be on HCTZ prior to admission which we will discontinue. -Potassium currently at 3.7, sodium stable at 130. -Magnesium at 1.7. -Magnesium sulfate 4 g IV x1. -Repeat labs in the AM.  4.  Depression/anxiety -Continue home regimen Celexa, hydroxyzine.  5.  Hypertension -BP noted to be borderline on admission. -HCTZ on hold and likely will not resume on discharge due to hyponatremia. -Continue to hold ARB. -Saline lock IV fluids.  6.  History of breast cancer -Per oncology.    DVT prophylaxis: SCDs Code Status: Full Family Communication: Updated patient.  No family at bedside.   Disposition: Likely home in 24 hours if thrombocytopenia does not worsen and continue clinical improvement.  Status is: Observation The patient remains OBS appropriate and will d/c before 2 midnights.   Consultants:  Hematology/oncology: [name] 10/21/2021  Procedures:  CT chest abdomen and pelvis 10/21/2021 Transfusion 2 units packed red blood cells 10/21/2021  Antimicrobials:  None   Subjective: Sitting up in bed.  Denies any chest pain.  No shortness of breath.  No abdominal pain.  Denies any bleeding.  Feels better overall.  Tolerating current diet.    Objective: Vitals:   10/22/21  9169 10/22/21 1306 10/22/21  1921 10/23/21 0427  BP: 106/72 112/62 115/63 121/62  Pulse: 68 66 71 68  Resp: 14 18 16 16   Temp: 98.5 F (36.9 C) 97.7 F (36.5 C) 98.5 F (36.9 C) 98.2 F (36.8 C)  TempSrc: Oral Oral Oral Oral  SpO2: 98% 99% 97% 96%  Height:       No intake or output data in the 24 hours ending 10/23/21 1239  There were no vitals filed for this visit.  Examination:  General exam: NAD Respiratory system: Lungs clear to auscultation bilaterally.  No wheezes, no crackles, no rhonchi.  Fair air movement.  Speaking in full sentences.  Cardiovascular system: Regular rate and rhythm no murmurs rubs or gallops.  No JVD.  No lower extremity edema.  Gastrointestinal system: Abdomen is soft, nontender, nondistended, positive bowel sounds.  No rebound.  No guarding.   Central nervous system: Alert and oriented. No focal neurological deficits. Extremities: Symmetric 5 x 5 power. Skin: No rashes, lesions or ulcers Psychiatry: Judgement and insight appear normal. Mood & affect appropriate.     Data Reviewed: I have personally reviewed following labs and imaging studies  CBC: Recent Labs  Lab 10/21/21 1240 10/22/21 0037 10/22/21 1400 10/23/21 0704  WBC 4.5 4.2 3.1* 3.4*  NEUTROABS  --   --   --  1.2*  HGB 5.9* 9.0* 8.8* 8.8*  HCT 17.5* 26.6* 25.9* 25.9*  MCV 106.1* 96.7 97.7 97.0  PLT 54* 48*  47* 42* 36*     Basic Metabolic Panel: Recent Labs  Lab 10/21/21 1240 10/22/21 0037 10/23/21 0704  NA 130* 130* 130*  K 3.4* 3.9 3.7  CL 98 100 99  CO2 24 24 24   GLUCOSE 404* 171* 366*  BUN 13 10 12   CREATININE 0.57 0.50 0.51  CALCIUM 8.7* 8.7* 8.3*  MG 1.6* 1.5* 1.7     GFR: CrCl cannot be calculated (Unknown ideal weight.).  Liver Function Tests: Recent Labs  Lab 10/21/21 1240 10/22/21 0037  AST 14* 17  ALT 10 9  ALKPHOS 116 84  BILITOT 0.8 0.6  PROT 8.5* 7.8  ALBUMIN 3.3* 3.0*     CBG: Recent Labs  Lab 10/22/21 1129 10/22/21 1633 10/22/21 2134 10/23/21 0746  10/23/21 1210  GLUCAP 277* 265* 289* 303* 244*      No results found for this or any previous visit (from the past 240 hour(s)).       Radiology Studies: CT CHEST ABDOMEN PELVIS W CONTRAST  Result Date: 10/21/2021 CLINICAL DATA:  History of breast cancer with new pancytopenia. Evaluate for cancer recurrence. EXAM: CT CHEST, ABDOMEN, AND PELVIS WITH CONTRAST TECHNIQUE: Multidetector CT imaging of the chest, abdomen and pelvis was performed following the standard protocol during bolus administration of intravenous contrast. RADIATION DOSE REDUCTION: This exam was performed according to the departmental dose-optimization program which includes automated exposure control, adjustment of the mA and/or kV according to patient size and/or use of iterative reconstruction technique. CONTRAST:  166m OMNIPAQUE IOHEXOL 300 MG/ML  SOLN COMPARISON:  CT abdomen and pelvis 08/23/2019. CT of the chest 04/11/2017. FINDINGS: CT CHEST FINDINGS Cardiovascular: No significant vascular findings. Normal heart size. No pericardial effusion. There are atherosclerotic calcifications of the aorta. Mediastinum/Nodes: There is mild wall thickening of the distal esophagus. No enlarged mediastinal or hilar lymph nodes are seen. Visualized thyroid gland is within normal limits. Lungs/Pleura: There is some reticular opacities in the anterior left upper lobe, likely postradiation changes. There are some patchy areas of  ground-glass opacities in the left upper lobe/lung apex, new from prior. There is minimal atelectasis in the right lung base. No discrete pulmonary nodules are seen. No pleural effusion or pneumothorax. Musculoskeletal: Patient is status post left mastectomy. Left axillary surgical clips are present. There is some nonenlarged left chest wall lymph nodes. There also nonenlarged right axillary lymph nodes. No acute fracture or focal osseous lesion. CT ABDOMEN PELVIS FINDINGS Hepatobiliary: No focal liver abnormality is  seen. No gallstones, gallbladder wall thickening, or biliary dilatation. Pancreas: Unremarkable. No pancreatic ductal dilatation or surrounding inflammatory changes. Spleen: Normal in size without focal abnormality. Adrenals/Urinary Tract: Adrenal glands are unremarkable. Kidneys are normal, without renal calculi, focal lesion, or hydronephrosis. Bladder is unremarkable. Stomach/Bowel: Stomach is within normal limits. Appendix appears normal. No evidence of bowel wall thickening, distention, or inflammatory changes. Vascular/Lymphatic: Aortic atherosclerosis. No enlarged abdominal or pelvic lymph nodes. Reproductive: Uterus and bilateral adnexa are unremarkable. Other: There is a small fat containing umbilical hernia. No ascites. Musculoskeletal: No acute or significant osseous findings. IMPRESSION: 1. New focal ground-glass opacities in the left upper lobe, likely infectious/inflammatory. Short-term follow-up CT recommended to confirm complete resolution and to exclude underlying neoplastic process. 2. No evidence for metastatic disease in the chest, abdomen or pelvis. 3. Status post left mastectomy and axillary lymph node dissection. 4. Mild wall thickening of the distal esophagus, nonspecific. Correlate clinically for esophagitis. Electronically Signed   By: Ronney Asters M.D.   On: 10/21/2021 23:23        Scheduled Meds:  atorvastatin  40 mg Oral Daily   citalopram  20 mg Oral Daily   feeding supplement (GLUCERNA SHAKE)  237 mL Oral TID BM   insulin aspart  0-15 Units Subcutaneous TID WC   insulin aspart  0-5 Units Subcutaneous QHS   insulin aspart  6 Units Subcutaneous TID WC   insulin glargine-yfgn  15 Units Subcutaneous QHS   pantoprazole  40 mg Oral Q0600   potassium chloride  40 mEq Oral Daily   Continuous Infusions:     LOS: 0 days    Time spent: 40 minutes    Irine Seal, MD [facility]   To contact the attending provider between 7A-7P or the covering provider  during after hours 7P-7A, please log into the web site [email or web address] and access using universal [place] password for that web site. If you do not have the password, please call the hospital operator.  10/23/2021, 12:39 PM

## 2021-10-24 DIAGNOSIS — Z7984 Long term (current) use of oral hypoglycemic drugs: Secondary | ICD-10-CM | POA: Diagnosis not present

## 2021-10-24 DIAGNOSIS — F32A Depression, unspecified: Secondary | ICD-10-CM | POA: Diagnosis present

## 2021-10-24 DIAGNOSIS — E785 Hyperlipidemia, unspecified: Secondary | ICD-10-CM | POA: Diagnosis present

## 2021-10-24 DIAGNOSIS — Z79899 Other long term (current) drug therapy: Secondary | ICD-10-CM | POA: Diagnosis not present

## 2021-10-24 DIAGNOSIS — E119 Type 2 diabetes mellitus without complications: Secondary | ICD-10-CM | POA: Diagnosis not present

## 2021-10-24 DIAGNOSIS — Z8673 Personal history of transient ischemic attack (TIA), and cerebral infarction without residual deficits: Secondary | ICD-10-CM | POA: Diagnosis not present

## 2021-10-24 DIAGNOSIS — E876 Hypokalemia: Secondary | ICD-10-CM | POA: Diagnosis present

## 2021-10-24 DIAGNOSIS — Z91148 Patient's other noncompliance with medication regimen for other reason: Secondary | ICD-10-CM | POA: Diagnosis not present

## 2021-10-24 DIAGNOSIS — Z794 Long term (current) use of insulin: Secondary | ICD-10-CM | POA: Diagnosis not present

## 2021-10-24 DIAGNOSIS — D46Z Other myelodysplastic syndromes: Secondary | ICD-10-CM | POA: Diagnosis present

## 2021-10-24 DIAGNOSIS — E1165 Type 2 diabetes mellitus with hyperglycemia: Secondary | ICD-10-CM | POA: Diagnosis present

## 2021-10-24 DIAGNOSIS — Z923 Personal history of irradiation: Secondary | ICD-10-CM | POA: Diagnosis not present

## 2021-10-24 DIAGNOSIS — D696 Thrombocytopenia, unspecified: Secondary | ICD-10-CM | POA: Diagnosis present

## 2021-10-24 DIAGNOSIS — I959 Hypotension, unspecified: Secondary | ICD-10-CM | POA: Diagnosis present

## 2021-10-24 DIAGNOSIS — Z833 Family history of diabetes mellitus: Secondary | ICD-10-CM | POA: Diagnosis not present

## 2021-10-24 DIAGNOSIS — Z170 Estrogen receptor positive status [ER+]: Secondary | ICD-10-CM | POA: Diagnosis not present

## 2021-10-24 DIAGNOSIS — Z9012 Acquired absence of left breast and nipple: Secondary | ICD-10-CM | POA: Diagnosis not present

## 2021-10-24 DIAGNOSIS — Z853 Personal history of malignant neoplasm of breast: Secondary | ICD-10-CM | POA: Diagnosis not present

## 2021-10-24 DIAGNOSIS — E871 Hypo-osmolality and hyponatremia: Secondary | ICD-10-CM | POA: Diagnosis present

## 2021-10-24 DIAGNOSIS — D649 Anemia, unspecified: Secondary | ICD-10-CM | POA: Diagnosis present

## 2021-10-24 DIAGNOSIS — Z9221 Personal history of antineoplastic chemotherapy: Secondary | ICD-10-CM | POA: Diagnosis not present

## 2021-10-24 DIAGNOSIS — F419 Anxiety disorder, unspecified: Secondary | ICD-10-CM | POA: Diagnosis present

## 2021-10-24 DIAGNOSIS — I10 Essential (primary) hypertension: Secondary | ICD-10-CM | POA: Diagnosis present

## 2021-10-24 LAB — CBC WITH DIFFERENTIAL/PLATELET
Abs Immature Granulocytes: 0.01 10*3/uL (ref 0.00–0.07)
Basophils Absolute: 0 10*3/uL (ref 0.0–0.1)
Basophils Relative: 1 %
Eosinophils Absolute: 0.1 10*3/uL (ref 0.0–0.5)
Eosinophils Relative: 4 %
HCT: 26.1 % — ABNORMAL LOW (ref 36.0–46.0)
Hemoglobin: 8.9 g/dL — ABNORMAL LOW (ref 12.0–15.0)
Immature Granulocytes: 0 %
Lymphocytes Relative: 67 %
Lymphs Abs: 2.3 10*3/uL (ref 0.7–4.0)
MCH: 33.1 pg (ref 26.0–34.0)
MCHC: 34.1 g/dL (ref 30.0–36.0)
MCV: 97 fL (ref 80.0–100.0)
Monocytes Absolute: 0.3 10*3/uL (ref 0.1–1.0)
Monocytes Relative: 8 %
Neutro Abs: 0.7 10*3/uL — ABNORMAL LOW (ref 1.7–7.7)
Neutrophils Relative %: 20 %
Platelets: 30 10*3/uL — ABNORMAL LOW (ref 150–400)
RBC: 2.69 MIL/uL — ABNORMAL LOW (ref 3.87–5.11)
RDW: 18.2 % — ABNORMAL HIGH (ref 11.5–15.5)
WBC: 3.4 10*3/uL — ABNORMAL LOW (ref 4.0–10.5)
nRBC: 0 % (ref 0.0–0.2)

## 2021-10-24 LAB — MAGNESIUM: Magnesium: 1.7 mg/dL (ref 1.7–2.4)

## 2021-10-24 LAB — BASIC METABOLIC PANEL
Anion gap: 6 (ref 5–15)
BUN: 16 mg/dL (ref 8–23)
CO2: 25 mmol/L (ref 22–32)
Calcium: 8.5 mg/dL — ABNORMAL LOW (ref 8.9–10.3)
Chloride: 100 mmol/L (ref 98–111)
Creatinine, Ser: 0.51 mg/dL (ref 0.44–1.00)
GFR, Estimated: >60 mL/min (ref >60)
Glucose, Bld: 301 mg/dL — ABNORMAL HIGH (ref 70–99)
Potassium: 3.8 mmol/L (ref 3.5–5.1)
Sodium: 131 mmol/L — ABNORMAL LOW (ref 135–145)

## 2021-10-24 LAB — GLUCOSE, CAPILLARY
Glucose-Capillary: 293 mg/dL — ABNORMAL HIGH (ref 70–99)
Glucose-Capillary: 276 mg/dL — ABNORMAL HIGH (ref 70–99)
Glucose-Capillary: 281 mg/dL — ABNORMAL HIGH (ref 70–99)
Glucose-Capillary: 315 mg/dL — ABNORMAL HIGH (ref 70–99)

## 2021-10-24 MED ORDER — INSULIN GLARGINE-YFGN 100 UNIT/ML ~~LOC~~ SOLN
20.0000 [IU] | Freq: Every day | SUBCUTANEOUS | Status: DC
  Administered 2021-10-24: 20 [IU] via SUBCUTANEOUS
  Filled 2021-10-24: qty 0.2

## 2021-10-24 MED ORDER — INSULIN GLARGINE-YFGN 100 UNIT/ML ~~LOC~~ SOLN
5.0000 [IU] | Freq: Once | SUBCUTANEOUS | Status: AC
  Administered 2021-10-24: 5 [IU] via SUBCUTANEOUS
  Filled 2021-10-24: qty 0.05

## 2021-10-24 MED ORDER — INSULIN ASPART 100 UNIT/ML IJ SOLN
10.0000 [IU] | Freq: Three times a day (TID) | INTRAMUSCULAR | Status: DC
  Administered 2021-10-26 (×2): 10 [IU] via SUBCUTANEOUS

## 2021-10-24 MED ORDER — MAGNESIUM SULFATE 4 GM/100ML IV SOLN
4.0000 g | Freq: Once | INTRAVENOUS | Status: AC
  Administered 2021-10-24: 4 g via INTRAVENOUS
  Filled 2021-10-24: qty 100

## 2021-10-24 NOTE — Progress Notes (Addendum)
PROGRESS NOTE    Amanda Duarte  MRN:[id number] DOB: 06/02/1955 DOA: 10/21/2021 PCP: Fleming, Zelda W, NP    Chief Complaint  Patient presents with   Abnormal Lab    Brief Narrative:  Patient is 66-year-old female history of hypertension, hyperlipidemia, anemia, depression/anxiety, type 2 diabetes mellitus, history of breast cancer  being followed by hematology/oncology in the outpatient setting presenting to the ED with severe symptomatic anemia, moderate thrombocytopenia, FOBT negative.  Hematology/oncology consulted.  Patient transfused 2 units packed red blood cells.  CTA chest abdomen and pelvis obtained.   Assessment & Plan:   Principal Problem:   Symptomatic anemia Active Problems:   Essential hypertension   DM2 (diabetes mellitus, type 2) (HCC)   Malignant neoplasm of upper-inner quadrant of left breast in female, estrogen receptor positive (HCC)   Thrombocytopenia (HCC)   Hyponatremia   Hypokalemia   Anxiety   Depression  #1 severe symptomatic anemia/thrombocytopenia -Patient presenting generalized fatigue, intermittent shortness of breath, noted to have a severe symptomatic anemia with hemoglobin of 5.9 on admission, platelet count of 54. -FOBT done was negative. -Status posttransfusion 2 units packed red blood cells hemoglobin currently at 8.9 this morning. -Platelet count trending down currently at 30 from 36 from 42 from 48 from 54. -Patient denies any overt bleeding. -See CT chest abdomen and pelvis done per hematology/oncology recommendations with new focal groundglass opacities left upper lobe likely infectious/inflammatory, short-term follow-up CT recommended, no evidence of metastatic disease in the chest abdomen or pelvis, status post left mastectomy and axillary lymph node dissection, mild wall thickening of the distal esophagus, nonspecific, correlate clinically with esophagitis. -Patient seen in consultation by hematology/oncology, Dr. Feng who notes that  patient previously received chemotherapy including Adriamycin which could potentially cause MDS and leukemia.  Blood smear review sent off.  Hematology/oncology, is followed by hematology/oncology patient may need a bone marrow biopsy which can be arranged in the outpatient setting. -We will monitor platelets over the next 24 hours to see if continues to trend down as patient at increased risk for bleeding.   -Follow CBC.  2.  Poorly controlled diabetes mellitus type 2 -Hemoglobin A1c 13.3 (10/05/2021) -CBG noted at 293 this morning. -Continue to hold oral hypoglycemic agents. -Increase Semglee to 20 units daily, increase NovoLog meal coverage to 10 units 3 times daily with meals.  SSI.    3.  Hypokalemia/hyponatremia/hypomagnesemia -Patient noted to be on HCTZ prior to admission which we will discontinue. -Potassium currently at 3.8, sodium stable at 131. -Magnesium at 1.7. -Magnesium sulfate 4 g IV x1. -Repeat labs in the AM.  4.  Depression/anxiety -Continue home regimen Celexa, hydroxyzine.  5.  Hypertension -BP noted to be borderline on admission. -HCTZ held during this hospitalization and likely will not be resumed on discharge.   -Continue to hold ARB.   -IV fluids-saline lock.  6.  History of breast cancer -Per oncology.    DVT prophylaxis: SCDs Code Status: Full Family Communication: Updated patient.  Updated daughter Eva Hernandez on the telephone.  Disposition: Likely home in 24 hours if thrombocytopenia stabilizes with no further worsening and no bleeding.  Status is: Inpatient Patient to be changed to inpatient due to increased risk of deterioration and significant increased risk of bleeding due to continuous drop in platelet count/thrombocytopenia.    Consultants:  Hematology/oncology: Dr. Feng 10/21/2021  Procedures:  CT chest abdomen and pelvis 10/21/2021 Transfusion 2 units packed red blood cells 10/21/2021  Antimicrobials:  None   Subjective: Laying  in   bed.  No chest pain.  No shortness of breath.  Denies any bleeding.  Overall feels well.  Tolerating current diet.  Seems a little hesitant to be taking insulin at home.   Objective: Vitals:   10/23/21 1223 10/23/21 1317 10/23/21 2132 10/24/21 0549  BP:  121/61 117/60 123/65  Pulse:  65 (!) 101 65  Resp:  18 17 17  Temp:  97.8 F (36.6 C) 98.5 F (36.9 C) 98 F (36.7 C)  TempSrc:  Oral Oral Oral  SpO2:  98% 95% 95%  Weight: 51.8 kg     Height:        Intake/Output Summary (Last 24 hours) at 10/24/2021 1302 Last data filed at 10/24/2021 0934 Gross per 24 hour  Intake 240 ml  Output --  Net 240 ml    Filed Weights   10/23/21 1223  Weight: 51.8 kg    Examination:  General exam: NAD Respiratory system: CTA B.  No wheezes, no crackles, no rhonchi.  Fair air movement.  Speaking in full sentences.  Cardiovascular system: RRR no murmurs rubs or gallops.  No JVD.  No lower extremity edema.   Gastrointestinal system: Abdomen is soft, nontender, nondistended, positive bowel sounds.  No rebound.  No guarding.   Central nervous system: Alert and oriented. No focal neurological deficits. Extremities: Symmetric 5 x 5 power. Skin: No rashes, lesions or ulcers Psychiatry: Judgement and insight appear normal. Mood & affect appropriate.     Data Reviewed: I have personally reviewed following labs and imaging studies  CBC: Recent Labs  Lab 10/21/21 1240 10/22/21 0037 10/22/21 1400 10/23/21 0704 10/24/21 0734  WBC 4.5 4.2 3.1* 3.4* 3.4*  NEUTROABS  --   --   --  1.2* 0.7*  HGB 5.9* 9.0* 8.8* 8.8* 8.9*  HCT 17.5* 26.6* 25.9* 25.9* 26.1*  MCV 106.1* 96.7 97.7 97.0 97.0  PLT 54* 48*  47* 42* 36* 30*     Basic Metabolic Panel: Recent Labs  Lab 10/21/21 1240 10/22/21 0037 10/23/21 0704 10/24/21 0734  NA 130* 130* 130* 131*  K 3.4* 3.9 3.7 3.8  CL 98 100 99 100  CO2 24 24 24 25  GLUCOSE 404* 171* 366* 301*  BUN 13 10 12 16  CREATININE 0.57 0.50 0.51 0.51  CALCIUM  8.7* 8.7* 8.3* 8.5*  MG 1.6* 1.5* 1.7 1.7     GFR: Estimated Creatinine Clearance: 54.7 mL/min (by C-G formula based on SCr of 0.51 mg/dL).  Liver Function Tests: Recent Labs  Lab 10/21/21 1240 10/22/21 0037  AST 14* 17  ALT 10 9  ALKPHOS 116 84  BILITOT 0.8 0.6  PROT 8.5* 7.8  ALBUMIN 3.3* 3.0*     CBG: Recent Labs  Lab 10/23/21 0746 10/23/21 1210 10/23/21 2130 10/24/21 0751 10/24/21 1149  GLUCAP 303* 244* 311* 293* 276*      No results found for this or any previous visit (from the past 240 hour(s)).       Radiology Studies: No results found.      Scheduled Meds:  atorvastatin  40 mg Oral Daily   citalopram  20 mg Oral Daily   feeding supplement (GLUCERNA SHAKE)  237 mL Oral TID BM   insulin aspart  0-15 Units Subcutaneous TID WC   insulin aspart  0-5 Units Subcutaneous QHS   insulin aspart  10 Units Subcutaneous TID WC   insulin glargine-yfgn  20 Units Subcutaneous QHS   pantoprazole  40 mg Oral Q0600   potassium chloride  40   mEq Oral Daily   Continuous Infusions:     LOS: 0 days    Time spent: 40 minutes    Daniel Thompson, MD [facility]   To contact the attending provider between 7A-7P or the covering provider during after hours 7P-7A, please log into the web site [email or web address] and access using universal [place] password for that web site. If you do not have the password, please call the hospital operator.  10/24/2021, 1:02 PM    

## 2021-10-25 ENCOUNTER — Encounter (HOSPITAL_COMMUNITY): Payer: Self-pay | Admitting: Internal Medicine

## 2021-10-25 ENCOUNTER — Inpatient Hospital Stay (HOSPITAL_COMMUNITY): Payer: Medicare Other

## 2021-10-25 DIAGNOSIS — F32A Depression, unspecified: Secondary | ICD-10-CM | POA: Diagnosis not present

## 2021-10-25 DIAGNOSIS — F419 Anxiety disorder, unspecified: Secondary | ICD-10-CM | POA: Diagnosis not present

## 2021-10-25 DIAGNOSIS — D649 Anemia, unspecified: Secondary | ICD-10-CM | POA: Diagnosis not present

## 2021-10-25 DIAGNOSIS — I10 Essential (primary) hypertension: Secondary | ICD-10-CM | POA: Diagnosis not present

## 2021-10-25 LAB — GLUCOSE, CAPILLARY
Glucose-Capillary: 356 mg/dL — ABNORMAL HIGH (ref 70–99)
Glucose-Capillary: 278 mg/dL — ABNORMAL HIGH (ref 70–99)
Glucose-Capillary: 175 mg/dL — ABNORMAL HIGH (ref 70–99)
Glucose-Capillary: 144 mg/dL — ABNORMAL HIGH (ref 70–99)
Glucose-Capillary: 275 mg/dL — ABNORMAL HIGH (ref 70–99)

## 2021-10-25 LAB — BASIC METABOLIC PANEL
Anion gap: 6 (ref 5–15)
BUN: 14 mg/dL (ref 8–23)
CO2: 26 mmol/L (ref 22–32)
Calcium: 8.7 mg/dL — ABNORMAL LOW (ref 8.9–10.3)
Chloride: 102 mmol/L (ref 98–111)
Creatinine, Ser: 0.6 mg/dL (ref 0.44–1.00)
GFR, Estimated: >60 mL/min (ref >60)
Glucose, Bld: 277 mg/dL — ABNORMAL HIGH (ref 70–99)
Potassium: 3.5 mmol/L (ref 3.5–5.1)
Sodium: 134 mmol/L — ABNORMAL LOW (ref 135–145)

## 2021-10-25 LAB — MAGNESIUM: Magnesium: 1.9 mg/dL (ref 1.7–2.4)

## 2021-10-25 MED ORDER — FENTANYL CITRATE (PF) 100 MCG/2ML IJ SOLN
INTRAMUSCULAR | Status: AC | PRN
  Administered 2021-10-25 (×2): 50 ug via INTRAVENOUS

## 2021-10-25 MED ORDER — MIDAZOLAM HCL 2 MG/2ML IJ SOLN
INTRAMUSCULAR | Status: AC
  Filled 2021-10-25: qty 4

## 2021-10-25 MED ORDER — SODIUM CHLORIDE 0.9 % IV SOLN
INTRAVENOUS | Status: AC
  Filled 2021-10-25: qty 250

## 2021-10-25 MED ORDER — INSULIN GLARGINE-YFGN 100 UNIT/ML ~~LOC~~ SOLN
25.0000 [IU] | Freq: Every day | SUBCUTANEOUS | Status: DC
  Administered 2021-10-25: 25 [IU] via SUBCUTANEOUS
  Filled 2021-10-25 (×2): qty 0.25

## 2021-10-25 MED ORDER — FENTANYL CITRATE (PF) 100 MCG/2ML IJ SOLN
INTRAMUSCULAR | Status: AC
  Filled 2021-10-25: qty 2

## 2021-10-25 MED ORDER — MIDAZOLAM HCL 2 MG/2ML IJ SOLN
INTRAMUSCULAR | Status: AC | PRN
  Administered 2021-10-25 (×2): 1 mg via INTRAVENOUS

## 2021-10-25 NOTE — TOC Progression Note (Addendum)
Transition of Care [facility]) - Progression Note    Patient Details  Name: Amanda Duarte MRN: [id number] Date of Birth: 1955/02/06  [facility]) CM/SW Contact  Leeroy Cha, RN Phone Number: 10/25/2021, 10:38 AM  Clinical Narrative:    Hhc order for rn sent to [facility]. [facility] has accepted patient.  Expected Discharge Plan: [place] Barriers to Discharge: Continued Medical Work up  Expected Discharge Plan and Services Expected Discharge Plan: [place]   Discharge Planning Services: CM Consult   Living arrangements for the past 2 months: Single Family Home                           HH Arranged: RN [facility] Agency: Other - See comment ([facility]) Date HH Agency Contacted: 10/25/21 Time [place]: 1037 Representative spoke with at [place]: Quintella Reichert   Social Determinants of Health (Early) Interventions    Readmission Risk Interventions   No data to display

## 2021-10-25 NOTE — Progress Notes (Signed)
PROGRESS NOTE    Amanda Duarte  ERD:[id number] DOB: 11-Jan-1956 DOA: 10/21/2021 PCP: [name], NP    Chief Complaint  Patient presents with   Abnormal Lab    Brief Narrative:  Patient is 66 year old female history of hypertension, hyperlipidemia, anemia, depression/anxiety, type 2 diabetes mellitus, history of breast cancer  being followed by hematology/oncology in the outpatient setting presenting to the ED with severe symptomatic anemia, moderate thrombocytopenia, FOBT negative.  Hematology/oncology consulted.  Patient transfused 2 units packed red blood cells.  CTA chest abdomen and pelvis obtained.   Assessment & Plan:   Principal Problem:   Symptomatic anemia Active Problems:   Essential hypertension   DM2 (diabetes mellitus, type 2) (HCC)   Malignant neoplasm of upper-inner quadrant of left breast in female, estrogen receptor positive (HCC)   Thrombocytopenia (HCC)   Hyponatremia   Hypokalemia   Anxiety   Depression  #1 severe symptomatic anemia/thrombocytopenia -Patient presenting generalized fatigue, intermittent shortness of breath, noted to have a severe symptomatic anemia with hemoglobin of 5.9 on admission, platelet count of 54. -FOBT done was negative. -Status posttransfusion 2 units packed red blood cells hemoglobin currently at 8.6 this morning. -Platelet count trending down currently at 29 from 30 from 36 from 42 from 48 from 54. -Patient denies any overt bleeding. -See CT chest abdomen and pelvis done per hematology/oncology recommendations with new focal groundglass opacities left upper lobe likely infectious/inflammatory, short-term follow-up CT recommended, no evidence of metastatic disease in the chest abdomen or pelvis, status post left mastectomy and axillary lymph node dissection, mild wall thickening of the distal esophagus, nonspecific, correlate clinically with esophagitis. -Patient seen in consultation by hematology/oncology, [name] who notes  that patient previously received chemotherapy including Adriamycin which could potentially cause MDS and leukemia.  Blood smear review sent off.  Hematology/oncology, following and recommending bone marrow biopsy be done during this hospitalization which has been ordered.  -Continue to follow platelets, transfusion threshold platelet count < 20,000 or active bleeding.  -Per hematology/oncology.    2.  Poorly controlled diabetes mellitus type 2 -Hemoglobin A1c 13.3 (10/05/2021) -CBG noted at 278 this morning. -Continue to hold oral hypoglycemic agents. -Increase Semglee to 25 units daily,  -Continue NovoLog meal coverage 10 units 3 times daily with meals.  SSI.    3.  Hypokalemia/hyponatremia/hypomagnesemia -Patient noted to be on HCTZ prior to admission which we will discontinue on discharge. -Potassium currently at 3.5, sodium improving, currently at 134.  Magnesium at 1.9. -Repeat labs in the AM.  4.  Depression/anxiety -Stable on home regimen of Celexa, hydroxyzine.   5.  Hypertension -BP noted to be borderline on admission. -HCTZ held during this hospitalization and likely will not be resumed on discharge.   -Continue to hold ARB.   -IV fluids-saline lock.  6.  History of breast cancer -Per oncology.    DVT prophylaxis: SCDs Code Status: Full Family Communication: Updated patient.  Updated daughter Amanda Duarte.  Disposition: Likely home once thrombocytopenia stabilizes with no further worsening and no bleeding.  Status is: Inpatient Patient to be changed to inpatient due to increased risk of deterioration and significant increased risk of bleeding due to continuous drop in platelet count/thrombocytopenia.    Consultants:  Hematology/oncology: [name] 10/21/2021  Procedures:  CT chest abdomen and pelvis 10/21/2021 Transfusion 2 units packed red blood cells 10/21/2021 Bone marrow biopsy pending 10/25/2021  Antimicrobials:  None   Subjective: Patient on her way down  for bone marrow biopsy.  Denies any  chest pain.  No shortness of breath.  No abdominal pain.  Denies any bleeding.  Tolerating current diet.    Objective: Vitals:   10/24/21 1400 10/24/21 2028 10/25/21 0431 10/25/21 1105  BP: (!) 110/53 114/62 131/67 139/63  Pulse: 64 71 72 67  Resp:  _0 Temp: 98.6 F (37 C) 98.4 F (36.9 C) 98.8 F (37.1 C)   TempSrc: Axillary Oral Oral   SpO2: 98% 97% 98% 100%  Weight:      Height:        Intake/Output Summary (Last 24 hours) at 10/25/2021 1122 Last data filed at 10/24/2021 2321 Gross per 24 hour  Intake 340 ml  Output --  Net 340 ml    Filed Weights   10/23/21 1223  Weight: 51.8 kg    Examination:  General exam: NAD Respiratory system: Lungs clear to auscultation bilaterally.  No wheezes, no crackles, no rhonchi.  Fair air movement.  Speaking in full sentences.  Cardiovascular system: Regular rate rhythm no murmurs rubs or gallops.  No JVD.  No lower extremity edema.  Gastrointestinal system: Abdomen is soft, nontender, nondistended, positive bowel sounds.  No rebound.  No guarding.   Central nervous system: Alert and oriented. No focal neurological deficits. Extremities: Symmetric 5 x 5 power. Skin: No rashes, lesions or ulcers Psychiatry: Judgement and insight appear normal. Mood & affect appropriate.     Data Reviewed: I have personally reviewed following labs and imaging studies  CBC: Recent Labs  Lab 10/22/21 0037 10/22/21 1400 10/23/21 0704 10/24/21 0734 10/25/21 0536  WBC 4.2 3.1* 3.4* 3.4* 3.4*  NEUTROABS  --   --  1.2* 0.7* 0.6*  HGB 9.0* 8.8* 8.8* 8.9* 8.6*  HCT 26.6* 25.9* 25.9* 26.1* 25.6*  MCV 96.7 97.7 97.0 97.0 98.1  PLT 48*  47* 42* 36* 30* 29*     Basic Metabolic Panel: Recent Labs  Lab 10/21/21 1240 10/22/21 0037 10/23/21 0704 10/24/21 0734 10/25/21 0536  NA 130* 130* 130* 131* 134*  K 3.4* 3.9 3.7 3.8 3.5  CL 98 100 99 100 102  CO2 _1 GLUCOSE 404* 171* 366* 301* 277*   BUN _2 CREATININE 0.57 0.50 0.51 0.51 0.60  CALCIUM 8.7* 8.7* 8.3* 8.5* 8.7*  MG 1.6* 1.5* 1.7 1.7 1.9     GFR: Estimated Creatinine Clearance: 54.7 mL/min (by C-G formula based on SCr of 0.6 mg/dL).  Liver Function Tests: Recent Labs  Lab 10/21/21 1240 10/22/21 0037  AST 14* 17  ALT 10 9  ALKPHOS 116 84  BILITOT 0.8 0.6  PROT 8.5* 7.8  ALBUMIN 3.3* 3.0*     CBG: Recent Labs  Lab 10/24/21 0751 10/24/21 1149 10/24/21 1728 10/24/21 2150 10/25/21 0739  GLUCAP 293* 276* 281* 315* 278*      No results found for this or any previous visit (from the past 240 hour(s)).       Radiology Studies: No results found.      Scheduled Meds:  atorvastatin  40 mg Oral Daily   citalopram  20 mg Oral Daily   feeding supplement (GLUCERNA SHAKE)  237 mL Oral TID BM   fentaNYL       insulin aspart  0-15 Units Subcutaneous TID WC   insulin aspart  0-5 Units Subcutaneous QHS   insulin aspart  10 Units Subcutaneous TID WC   insulin glargine-yfgn  25 Units Subcutaneous QHS   midazolam  pantoprazole  40 mg Oral Q0600   potassium chloride  40 mEq Oral Daily   Continuous Infusions:  sodium chloride        LOS: 1 day    Time spent: 40 minutes    Irine Seal, MD [facility]   To contact the attending provider between 7A-7P or the covering provider during after hours 7P-7A, please log into the web site [email or web address] and access using universal [place] password for that web site. If you do not have the password, please call the hospital operator.  10/25/2021, 11:22 AM

## 2021-10-25 NOTE — Procedures (Signed)
Interventional Radiology Procedure Note  Procedure: CT guided aspirate and core biopsy of right iliac bone Complications: None Recommendations: - Bedrest supine x 1 hrs - Hydrocodone PRN  Pain - Follow biopsy results  Signed,  Mataya Kilduff K. Mareesa Gathright, MD   

## 2021-10-25 NOTE — Consult Note (Signed)
Chief Complaint: Patient was seen in consultation today for CT guided bone marrow biopsy Chief Complaint  Patient presents with   Abnormal Lab    Referring Physician(s): Feng,Y  Supervising Physician: Jacqulynn Cadet  Patient Status: [facility] - In-pt  History of Present Illness: Amanda Duarte is a 66 y.o. female with PMH sig for left breast cancer, s/p mastectomy in 2019 along with chemoradiation, DM, HLD, HTN, anxiety/depression who recently presented to [facility] with severe symptomatic anemia and thrombocytopenia. She has been transfused. CT scan from last night showed no evidence of metastatic disease. D dimer elevated. Request now received for CT guided bone marrow biopsy for further evaluation. WBC today 3.4, hgb 8.6, plts 29k.   Past Medical History:  Diagnosis Date   Cancer ([place]) 03/2017   left breast cancer   Diabetes mellitus without complication ([place])    Hyperlipidemia    Hypertension    Malignant neoplasm of left breast ([place])    Stroke (cerebrum) ([place])    Tibial plateau fracture, left    04-13-17 had ORIF    Past Surgical History:  Procedure Laterality Date   MASTECTOMY Left 2019   MASTECTOMY MODIFIED RADICAL Left 11/20/2017   Procedure: LEFT MODIFIED RADICAL MASTECTOMY;  Surgeon: Fanny Skates, MD;  Location: [place];  Service: General;  Laterality: Left;   ORIF TIBIA PLATEAU Left 04/13/2017   Procedure: OPEN REDUCTION INTERNAL FIXATION (ORIF) TIBIAL PLATEAU;  Surgeon: Shona Needles, MD;  Location: [place];  Service: Orthopedics;  Laterality: Left;   PORT-A-CATH REMOVAL Right 11/20/2017   Procedure: REMOVAL PORT-A-CATH;  Surgeon: Fanny Skates, MD;  Location: [place];  Service: General;  Laterality: Right;   PORTACATH PLACEMENT Right 05/31/2017   Procedure: INSERTION PORT-A-CATH;  Surgeon: Fanny Skates, MD;  Location: [place];  Service: General;  Laterality: Right;    Allergies: Victoza [liraglutide]  Medications: Prior to Admission medications    Medication Sig Start Date End Date Taking? Authorizing Provider  atorvastatin (LIPITOR) 40 MG tablet Take 1 tablet (40 mg total) by mouth daily. 10/05/21  Yes [name], NP  citalopram (CELEXA) 20 MG tablet Take 1 tablet (20 mg total) by mouth daily. For depression 10/05/21  Yes [name], NP  fenofibrate (TRICOR) 145 MG tablet Take 1 tablet (145 mg total) by mouth daily. 10/05/21  Yes [name], NP  glimepiride (AMARYL) 4 MG tablet Take 1 tablet (4 mg total) by mouth in the morning and at bedtime. 10/05/21  Yes [name], NP  hydrochlorothiazide (HYDRODIURIL) 25 MG tablet Take 1 tablet (25 mg total) by mouth daily. Please make an appointment with [facility]! Patient taking differently: Take 25 mg by mouth daily. 10/05/21  Yes [name], NP  hydrOXYzine (ATARAX) 10 MG tablet Take 1 tablet (10 mg total) by mouth 3 (three) times daily as needed for anxiety. 10/05/21  Yes [name], NP  insulin glargine (LANTUS) 100 UNIT/ML Solostar Pen Inject 20 Units into the skin 2 (two) times daily. 10/05/21  Yes [name], NP  insulin lispro (HUMALOG) 100 UNIT/ML injection Inject 0.04 mLs (4 Units total) into the skin 3 (three) times daily before meals. 02/17/21  Yes [name], NP  losartan (COZAAR) 50 MG tablet Take 1 tablet (50 mg total) by mouth daily. 10/05/21  Yes [name], NP  potassium chloride SA (KLOR-CON M) 20 MEQ tablet Take 1 tablet (20 mEq total) by mouth daily. 10/05/21  Yes [name], NP  sitaGLIPtin-metformin (JANUMET) 50-1000 MG tablet TAKE 1 TABLET BY MOUTH 2 TIMES DAILY 10/05/21  Yes [name], NP  Accu-Chek Softclix Lancets lancets Use to check blood sugar 3 times daily. E11.65 02/15/21   [name], NP  Blood Glucose Monitoring Suppl (ACCU-CHEK GUIDE) w/Device KIT use kit to check blood glucose three times daily 02/15/21   [name], NP  Blood Pressure Monitor DEVI Please provide patient with insurance approved blood  pressure monitor 01/14/19   [name], NP  glucose blood (ACCU-CHEK GUIDE) test strip Use to check blood sugar 3 times daily. E11.65 02/15/21   [name], NP  Insulin Pen Needle 31G X 5 MM MISC USE AS INSTRUCTED. INJECT INTO THE SKIN ONCE NIGHTLY. 02/15/21 02/15/22  [name], NP  Insulin Syringe-Needle U-100 30G X 5/16" 1 ML MISC Use as directed to inject into the skin 3 times daily. 02/17/21   [name], NP  Na Sulfate-K Sulfate-Mg Sulf (SUPREP BOWEL PREP KIT) 17.5-3.13-1.6 GM/177ML SOLN Take 1 kit by mouth as directed. For colonoscopy prep Patient not taking: Reported on 10/21/2021 08/16/19   Thornton Park, MD  triamcinolone cream (KENALOG) 0.1 % Apply 1 application topically 2 (two) times daily. Patient not taking: Reported on 10/21/2021 01/05/21   Mayers, Loraine Grip, PA-C     Family History  Problem Relation Age of Onset   Heart Problems Mother    Diabetes Son    Breast cancer Neg Hx    Colon cancer Neg Hx    Rectal cancer Neg Hx    Stomach cancer Neg Hx    Esophageal cancer Neg Hx     Social History   Socioeconomic History   Marital status: Married    Spouse name: Not on file   Number of children: Not on file   Years of education: Not on file   Highest education level: Not on file  Occupational History   Not on file  Tobacco Use   Smoking status: Never   Smokeless tobacco: Never  Vaping Use   Vaping Use: Never used  Substance and Sexual Activity   Alcohol use: No   Drug use: No   Sexual activity: Not Currently    Birth control/protection: None  Other Topics Concern   Not on file  Social History Narrative   Not on file   Social Determinants of Health   Financial Resource Strain: Not on file  Food Insecurity: No Food Insecurity (10/21/2021)   Hunger Vital Sign    Worried About Running Out of Food in the Last Year: Never true    Ran Out of Food in the Last Year: Never true  Transportation Needs: No Transportation Needs (10/21/2021)   PRAPARE  - Hydrologist (Medical): No    Lack of Transportation (Non-Medical): No  Physical Activity: Not on file  Stress: Not on file  Social Connections: Not on file      Review of Systems denies fever, HA, CP, dyspnea, cough, abd/back pain, N/V or bleeding  Vital Signs: BP 131/67 (BP Location: Right Arm)   Pulse 72   Temp 98.8 F (37.1 C) (Oral)   Resp 14   Ht 5' 2"  (1.575 m)   Wt 114 lb 1.6 oz (51.8 kg)   SpO2 98%   BMI 20.87 kg/m      Physical Exam awake/alert; chest- CTA bilat; heart- RRR; abd- soft,+BS,NT;no LE edema  Imaging: CT CHEST ABDOMEN PELVIS W CONTRAST  Result  Date: 10/21/2021 CLINICAL DATA:  History of breast cancer with new pancytopenia. Evaluate for cancer recurrence. EXAM: CT CHEST, ABDOMEN, AND PELVIS WITH CONTRAST TECHNIQUE: Multidetector CT imaging of the chest, abdomen and pelvis was performed following the standard protocol during bolus administration of intravenous contrast. RADIATION DOSE REDUCTION: This exam was performed according to the departmental dose-optimization program which includes automated exposure control, adjustment of the mA and/or kV according to patient size and/or use of iterative reconstruction technique. CONTRAST:  127m OMNIPAQUE IOHEXOL 300 MG/ML  SOLN COMPARISON:  CT abdomen and pelvis 08/23/2019. CT of the chest 04/11/2017. FINDINGS: CT CHEST FINDINGS Cardiovascular: No significant vascular findings. Normal heart size. No pericardial effusion. There are atherosclerotic calcifications of the aorta. Mediastinum/Nodes: There is mild wall thickening of the distal esophagus. No enlarged mediastinal or hilar lymph nodes are seen. Visualized thyroid gland is within normal limits. Lungs/Pleura: There is some reticular opacities in the anterior left upper lobe, likely postradiation changes. There are some patchy areas of ground-glass opacities in the left upper lobe/lung apex, new from prior. There is minimal atelectasis in  the right lung base. No discrete pulmonary nodules are seen. No pleural effusion or pneumothorax. Musculoskeletal: Patient is status post left mastectomy. Left axillary surgical clips are present. There is some nonenlarged left chest wall lymph nodes. There also nonenlarged right axillary lymph nodes. No acute fracture or focal osseous lesion. CT ABDOMEN PELVIS FINDINGS Hepatobiliary: No focal liver abnormality is seen. No gallstones, gallbladder wall thickening, or biliary dilatation. Pancreas: Unremarkable. No pancreatic ductal dilatation or surrounding inflammatory changes. Spleen: Normal in size without focal abnormality. Adrenals/Urinary Tract: Adrenal glands are unremarkable. Kidneys are normal, without renal calculi, focal lesion, or hydronephrosis. Bladder is unremarkable. Stomach/Bowel: Stomach is within normal limits. Appendix appears normal. No evidence of bowel wall thickening, distention, or inflammatory changes. Vascular/Lymphatic: Aortic atherosclerosis. No enlarged abdominal or pelvic lymph nodes. Reproductive: Uterus and bilateral adnexa are unremarkable. Other: There is a small fat containing umbilical hernia. No ascites. Musculoskeletal: No acute or significant osseous findings. IMPRESSION: 1. New focal ground-glass opacities in the left upper lobe, likely infectious/inflammatory. Short-term follow-up CT recommended to confirm complete resolution and to exclude underlying neoplastic process. 2. No evidence for metastatic disease in the chest, abdomen or pelvis. 3. Status post left mastectomy and axillary lymph node dissection. 4. Mild wall thickening of the distal esophagus, nonspecific. Correlate clinically for esophagitis. Electronically Signed   By: ARonney AstersM.D.   On: 10/21/2021 23:23    Labs:  CBC: Recent Labs    10/22/21 1400 10/23/21 0704 10/24/21 0734 10/25/21 0536  WBC 3.1* 3.4* 3.4* 3.4*  HGB 8.8* 8.8* 8.9* 8.6*  HCT 25.9* 25.9* 26.1* 25.6*  PLT 42* 36* 30* 29*     COAGS: Recent Labs    10/22/21 0037  INR 1.3*  APTT 26    BMP: Recent Labs    10/22/21 0037 10/23/21 0704 10/24/21 0734 10/25/21 0536  NA 130* 130* 131* 134*  K 3.9 3.7 3.8 3.5  CL 100 99 100 102  CO2 24 24 25 26   GLUCOSE 171* 366* 301* 277*  BUN 10 12 16 14   CALCIUM 8.7* 8.3* 8.5* 8.7*  CREATININE 0.50 0.51 0.51 0.60  GFRNONAA >60 >60 >60 >60    LIVER FUNCTION TESTS: Recent Labs    05/21/21 0821 10/05/21 0925 10/21/21 1240 10/22/21 0037  BILITOT 0.5 0.5 0.8 0.6  AST 25 15 14* 17  ALT 18 7 10 9   ALKPHOS 133* 133* 116 84  PROT  8.3* 7.8 8.5* 7.8  ALBUMIN 4.1 3.9 3.3* 3.0*    TUMOR MARKERS: No results for input(s): "AFPTM", "CEA", "CA199", "CHROMGRNA" in the last 8760 hours.  Assessment and Plan: 66 y.o. female with PMH sig for left breast cancer, s/p mastectomy in 2019 along with chemoradiation, DM, HLD, HTN, anxiety/depression who recently presented to [facility] with severe symptomatic anemia and thrombocytopenia. She has been transfused. CT scan from last night showed no evidence of metastatic disease. D dimer elevated. Request now received for CT guided bone marrow biopsy for further evaluation. WBC today 3.4, hgb 8.6, plts 29k.Risks and benefits of procedure was discussed with the patient via interpreter including, but not limited to bleeding, infection, damage to adjacent structures or low yield requiring additional tests.  All of the questions were answered and there is agreement to proceed.  Consent signed and in chart. Procedure planned for this am.     Thank you for this interesting consult.  I greatly enjoyed meeting Amanda Duarte and look forward to participating in their care.  A copy of this report was sent to the requesting provider on this date.  Electronically Signed: D. Rowe Robert, PA-C 10/25/2021, 9:41 AM   I spent a total of 20 Minutes    in face to face in clinical consultation, greater than 50% of which was counseling/coordinating care for  CT guided bone marrow biopsy

## 2021-10-26 ENCOUNTER — Other Ambulatory Visit: Payer: Self-pay

## 2021-10-26 DIAGNOSIS — D649 Anemia, unspecified: Secondary | ICD-10-CM | POA: Diagnosis not present

## 2021-10-26 DIAGNOSIS — E119 Type 2 diabetes mellitus without complications: Secondary | ICD-10-CM

## 2021-10-26 DIAGNOSIS — F32A Depression, unspecified: Secondary | ICD-10-CM | POA: Diagnosis not present

## 2021-10-26 DIAGNOSIS — Z794 Long term (current) use of insulin: Secondary | ICD-10-CM

## 2021-10-26 DIAGNOSIS — F419 Anxiety disorder, unspecified: Secondary | ICD-10-CM | POA: Diagnosis not present

## 2021-10-26 LAB — CBC WITH DIFFERENTIAL/PLATELET
Abs Immature Granulocytes: 0.01 10*3/uL (ref 0.00–0.07)
Basophils Absolute: 0 10*3/uL (ref 0.0–0.1)
Basophils Relative: 0 %
Eosinophils Absolute: 0.1 10*3/uL (ref 0.0–0.5)
Eosinophils Relative: 2 %
HCT: 26.2 % — ABNORMAL LOW (ref 36.0–46.0)
Hemoglobin: 8.9 g/dL — ABNORMAL LOW (ref 12.0–15.0)
Immature Granulocytes: 0 %
Lymphocytes Relative: 71 %
Lymphs Abs: 2.6 10*3/uL (ref 0.7–4.0)
MCH: 33.2 pg (ref 26.0–34.0)
MCHC: 34 g/dL (ref 30.0–36.0)
MCV: 97.8 fL (ref 80.0–100.0)
Monocytes Absolute: 0.3 10*3/uL (ref 0.1–1.0)
Monocytes Relative: 8 %
Neutro Abs: 0.7 10*3/uL — ABNORMAL LOW (ref 1.7–7.7)
Neutrophils Relative %: 19 %
Platelets: 30 10*3/uL — ABNORMAL LOW (ref 150–400)
RBC: 2.68 MIL/uL — ABNORMAL LOW (ref 3.87–5.11)
RDW: 18 % — ABNORMAL HIGH (ref 11.5–15.5)
WBC: 3.7 10*3/uL — ABNORMAL LOW (ref 4.0–10.5)
nRBC: 0 % (ref 0.0–0.2)

## 2021-10-26 LAB — BASIC METABOLIC PANEL
Anion gap: 6 (ref 5–15)
BUN: 16 mg/dL (ref 8–23)
CO2: 26 mmol/L (ref 22–32)
Calcium: 9 mg/dL (ref 8.9–10.3)
Chloride: 104 mmol/L (ref 98–111)
Creatinine, Ser: 0.59 mg/dL (ref 0.44–1.00)
GFR, Estimated: >60 mL/min (ref >60)
Glucose, Bld: 128 mg/dL — ABNORMAL HIGH (ref 70–99)
Potassium: 3.8 mmol/L (ref 3.5–5.1)
Sodium: 136 mmol/L (ref 135–145)

## 2021-10-26 LAB — GLUCOSE, CAPILLARY
Glucose-Capillary: 135 mg/dL — ABNORMAL HIGH (ref 70–99)
Glucose-Capillary: 147 mg/dL — ABNORMAL HIGH (ref 70–99)

## 2021-10-26 MED ORDER — INSULIN GLARGINE 100 UNIT/ML SOLOSTAR PEN: 25.0000 [IU] | PEN_INJECTOR | Freq: Every day | SUBCUTANEOUS | 1 refills | Status: DC

## 2021-10-26 MED ORDER — PANTOPRAZOLE SODIUM 40 MG PO TBEC: 40.0000 mg | DELAYED_RELEASE_TABLET | Freq: Every day | ORAL | 1 refills | Status: DC

## 2021-10-26 NOTE — Discharge Summary (Signed)
Physician Discharge Summary  Amanda Duarte NFA:[id number] DOB: 07-23-1955 DOA: 10/21/2021  PCP: [name], NP  Admit date: 10/21/2021 Discharge date: 10/26/2021  Time spent: 55 minutes  Recommendations for Outpatient Follow-up:  Follow-up with [name], hematology/oncology as scheduled on 10/29/2021. Follow-up with [name], NP in 2 weeks or as previously scheduled.  On follow-up patient blood pressure need to be reassessed as patient's HCTZ was discontinued.  Patient will need a basic metabolic profile done to follow-up on electrolytes and renal function.  Patient will need a magnesium level checked as well.  Patient's diabetes also need to be followed up.   Discharge Diagnoses:  Principal Problem:   Symptomatic anemia Active Problems:   Essential hypertension   DM2 (diabetes mellitus, type 2) (HCC)   Malignant neoplasm of upper-inner quadrant of left breast in female, estrogen receptor positive (HCC)   Thrombocytopenia (HCC)   Hyponatremia   Hypokalemia   Anxiety   Depression   Discharge Condition: Stable and improved.  Diet recommendation: Carb modified diet  Filed Weights   10/23/21 1223  Weight: 51.8 kg    History of present illness:  HPI per Dr Amanda Duarte is a 66 y.o. female with medical history significant of HTN, HLD, anemia, anxiety/depression, DM2. Presenting with symptomatic anemia. She reports that she's had fatigue and intermittent shortness of breath for the last week. She has not had any chest pain, cough, fever or sick contacts. She has been following with her PCP about this matter. So labs were drawn during her last visit. It was noted that she was significantly anemic. It was recommended that she come to the ED for evaluation. She denies any hematochezia, hematemesis, hematuria. She denies any other aggravating or alleviating factors.   Hospital Course:  #1 severe symptomatic anemia/thrombocytopenia -Patient presenting generalized fatigue,  intermittent shortness of breath, noted to have a severe symptomatic anemia with hemoglobin of 5.9 on admission, platelet count of 54. -FOBT done was negative. -Status posttransfusion 2 units packed red blood cells hemoglobin stabilized at 8.9 by day of discharge.   -Platelet count during the hospitalization was trending down and went as low as 29 from 54 on admission.  Platelet count started to plateau and was 30 by day of discharge.  -Bone marrow biopsy was done at the direction of patient's hematology/oncologist on 10/25/2021 and results pending on discharge. -Patient denied any overt bleeding throughout the hospitalization. -CT chest abdomen and pelvis done per hematology/oncology recommendations with new focal groundglass opacities left upper lobe likely infectious/inflammatory, short-term follow-up CT recommended, no evidence of metastatic disease in the chest abdomen or pelvis, status post left mastectomy and axillary lymph node dissection, mild wall thickening of the distal esophagus, nonspecific, correlate clinically with esophagitis. -Patient seen in consultation by hematology/oncology, [name] who noted that patient previously received chemotherapy including Adriamycin which could potentially cause MDS and leukemia.  Blood smear review sent off.   -Patient's platelets were monitored once platelets plateaued and seem to be improving patient was deemed safe for discharge by hematology and will follow-up closely with hematology in the outpatient clinic on 10/29/2021 for repeat labs, follow-up on bone marrow biopsy results, further recommendations at that time.   2.  Poorly controlled diabetes mellitus type 2 -Hemoglobin A1c 13.3 (10/05/2021) -Patient's oral hypoglycemic agents were held during the hospitalization, patient maintained on Semglee dose uptitrated to 25 units daily as well as patient maintained on NovoLog meal coverage of 10 units 3 times daily with meals  as well as sliding scale  insulin.   -It was noted that patient has been noncompliant with her insulin at home as she was unable to give herself her insulin. -Discussed with daughter who stated she would give her mother and nighttime insulin dose daily. -CBGs improved, patient will be discharged on Lantus 25 units daily, home regimen sliding scale insulin, resumption of oral hypoglycemic agents.   -Outpatient follow-up with PCP.   3.  Hypokalemia/hyponatremia/hypomagnesemia -Patient noted to be on HCTZ prior to admission which has been discontinued.   -Patient maintained on home regimen potassium.   -Patient initially gently hydrated with IV fluids, HCTZ discontinued and hyponatremia improved and had resolved by day of discharge.   -Magnesium was repleted during the hospitalization.    4.  Depression/anxiety -Patient maintained on home regimen of Celexa and hydroxyzine.    5.  Hypertension -BP noted to be borderline on admission. -HCTZ held during this hospitalization and will not be resumed on discharge.   -ARB held and will be resumed on discharge.   -Help patient follow-up with PCP.     6.  History of breast cancer -Patient seen by oncology during the hospitalization.   -Patient already scheduled for outpatient axillary ultrasound on 08/27/2021.   -Outpatient follow-up with oncology.      Procedures: CT chest abdomen and pelvis 10/21/2021 Transfusion 2 units packed red blood cells 10/21/2021 Bone marrow biopsy 10/25/2021  Consultations: Hematology/oncology: [name] 10/21/2021    Discharge Exam: Vitals:   10/26/21 0656 10/26/21 1319  BP: (!) 123/57 114/62  Pulse: 62 67  Resp: 14 19  Temp: 98.2 F (36.8 C) 98.5 F (36.9 C)  SpO2: 98% 98%    General: NAD Cardiovascular: RRR no murmurs rubs or gallops.  No JVD.  No lower extremity edema. Respiratory: Clear to auscultation bilaterally.  No wheezes, no crackles, no rhonchi.  Fair air movement.  Speaking in full sentences.  Discharge  Instructions   Discharge Instructions     Diet Carb Modified   Complete by: As directed    Increase activity slowly   Complete by: As directed    No wound care   Complete by: As directed       Allergies as of 10/26/2021       Reactions   Victoza [liraglutide] Nausea And Vomiting        Medication List     STOP taking these medications    hydrochlorothiazide 25 MG tablet Commonly known as: HYDRODIURIL   Suprep Bowel Prep Kit 17.5-3.13-1.6 GM/177ML Soln Generic drug: Na Sulfate-K Sulfate-Mg Sulf   triamcinolone cream 0.1 % Commonly known as: KENALOG       TAKE these medications    Accu-Chek Guide test strip Generic drug: glucose blood Use to check blood sugar 3 times daily. E11.65   Accu-Chek Guide w/Device Kit use kit to check blood glucose three times daily   Accu-Chek Softclix Lancets lancets Use to check blood sugar 3 times daily. E11.65   atorvastatin 40 MG tablet Commonly known as: LIPITOR Tome 1 tableta (40 mg en total) por va oral diariamente. (Take 1 tablet (40 mg total) by mouth daily.)   B-D UF III MINI PEN NEEDLES 31G X 5 MM Misc Generic drug: Insulin Pen Needle USE AS INSTRUCTED. INJECT INTO THE SKIN ONCE NIGHTLY.   Blood Pressure Monitor Devi Please provide patient with insurance approved blood pressure monitor   citalopram 20 MG tablet Commonly known as: CELEXA Take 1 tablet (20 mg total) by mouth  daily. For depression   fenofibrate 145 MG tablet Commonly known as: TRICOR Take 1 tablet (145 mg total) by mouth daily.   glimepiride 4 MG tablet Commonly known as: AMARYL Take 1 tablet (4 mg total) by mouth in the morning and at bedtime.   HumaLOG 100 UNIT/ML injection Generic drug: insulin lispro Inject 0.04 mLs (4 Units total) into the skin 3 (three) times daily before meals.   hydrOXYzine 10 MG tablet Commonly known as: ATARAX Take 1 tablet (10 mg total) by mouth 3 (three) times daily as needed for anxiety.   insulin  glargine 100 UNIT/ML Solostar Pen Commonly known as: LANTUS Inject 25 Units into the skin at bedtime. What changed:  how much to take when to take this   Insulin Syringe-Needle U-100 30G X 5/16" 1 ML Misc Use as directed to inject into the skin 3 times daily.   Janumet 50-1000 MG tablet Generic drug: sitaGLIPtin-metformin Tome 1 tableta por va oral 2 veces al da. (TAKE 1 TABLET BY MOUTH 2 TIMES DAILY)   losartan 50 MG tablet Commonly known as: COZAAR Tome 1 tableta (50 mg en total) por va oral diariamente. (Take 1 tablet (50 mg total) by mouth daily.)   pantoprazole 40 MG tablet Commonly known as: PROTONIX Take 1 tablet (40 mg total) by mouth daily at 6 (six) AM. Start taking on: October 27, 2021   potassium chloride SA 20 MEQ tablet Commonly known as: KLOR-CON M Tome una tableta (20 mEq en total) por va oral diariamente. (Take 1 tablet (20 mEq total) by mouth daily.)       Allergies  Allergen Reactions   Victoza [Liraglutide] Nausea And Vomiting    Follow-up Information     [name], NP. Schedule an appointment as soon as possible for a visit in 2 week(s).   Specialty: Nurse Practitioner Contact information: [place] [address] [phone]         [name], MD Follow up on 10/29/2021.   Specialties: Hematology, Oncology Why: Follow-up as scheduled. Contact information: [place] [address] [phone]                  The results of significant diagnostics from this hospitalization (including imaging, microbiology, ancillary and laboratory) are listed below for reference.    Significant Diagnostic Studies: CT BONE MARROW BIOPSY  Result Date: 10/25/2021 INDICATION: Anemia, thrombocytopenia EXAM: CT GUIDED BONE MARROW ASPIRATION AND CORE BIOPSY MEDICATIONS: None. ANESTHESIA/SEDATION: Moderate (conscious) sedation was employed during this procedure. A total of 2 milligrams versed and 100  micrograms fentanyl were administered intravenously. The patient's level of consciousness and vital signs were monitored continuously by radiology nursing throughout the procedure under my direct supervision. Total monitored sedation time: 10 minutes FLUOROSCOPY: None. COMPLICATIONS: None immediate. Estimated blood loss: <25 mL PROCEDURE: Informed written consent was obtained from the patient after a thorough discussion of the procedural risks, benefits and alternatives. All questions were addressed. Maximal Sterile Barrier Technique was utilized including caps, mask, sterile gowns, sterile gloves, sterile drape, hand hygiene and skin antiseptic. A timeout was performed prior to the initiation of the procedure. The patient was positioned prone and non-contrast localization CT was performed of the pelvis to demonstrate the iliac marrow spaces. Maximal barrier sterile technique utilized including caps, mask, sterile gowns, sterile gloves, large sterile drape, hand hygiene, and betadine prep. Under sterile conditions and local anesthesia, an 11 gauge coaxial bone biopsy needle was advanced into the right iliac  marrow space. Needle position was confirmed with CT imaging. Initially, bone marrow aspiration was performed. Next, the 11 gauge outer cannula was utilized to obtain a right iliac bone marrow core biopsy. Needle was removed. Hemostasis was obtained with compression. The patient tolerated the procedure well. Samples were prepared with the cytotechnologist. IMPRESSION: Successful right iliac bone marrow aspirate and core biopsy. Electronically Signed   By: Jacqulynn Cadet M.D.   On: 10/25/2021 12:30   CT CHEST ABDOMEN PELVIS W CONTRAST  Result Date: 10/21/2021 CLINICAL DATA:  History of breast cancer with new pancytopenia. Evaluate for cancer recurrence. EXAM: CT CHEST, ABDOMEN, AND PELVIS WITH CONTRAST TECHNIQUE: Multidetector CT imaging of the chest, abdomen and pelvis was performed following the standard  protocol during bolus administration of intravenous contrast. RADIATION DOSE REDUCTION: This exam was performed according to the departmental dose-optimization program which includes automated exposure control, adjustment of the mA and/or kV according to patient size and/or use of iterative reconstruction technique. CONTRAST:  18m OMNIPAQUE IOHEXOL 300 MG/ML  SOLN COMPARISON:  CT abdomen and pelvis 08/23/2019. CT of the chest 04/11/2017. FINDINGS: CT CHEST FINDINGS Cardiovascular: No significant vascular findings. Normal heart size. No pericardial effusion. There are atherosclerotic calcifications of the aorta. Mediastinum/Nodes: There is mild wall thickening of the distal esophagus. No enlarged mediastinal or hilar lymph nodes are seen. Visualized thyroid gland is within normal limits. Lungs/Pleura: There is some reticular opacities in the anterior left upper lobe, likely postradiation changes. There are some patchy areas of ground-glass opacities in the left upper lobe/lung apex, new from prior. There is minimal atelectasis in the right lung base. No discrete pulmonary nodules are seen. No pleural effusion or pneumothorax. Musculoskeletal: Patient is status post left mastectomy. Left axillary surgical clips are present. There is some nonenlarged left chest wall lymph nodes. There also nonenlarged right axillary lymph nodes. No acute fracture or focal osseous lesion. CT ABDOMEN PELVIS FINDINGS Hepatobiliary: No focal liver abnormality is seen. No gallstones, gallbladder wall thickening, or biliary dilatation. Pancreas: Unremarkable. No pancreatic ductal dilatation or surrounding inflammatory changes. Spleen: Normal in size without focal abnormality. Adrenals/Urinary Tract: Adrenal glands are unremarkable. Kidneys are normal, without renal calculi, focal lesion, or hydronephrosis. Bladder is unremarkable. Stomach/Bowel: Stomach is within normal limits. Appendix appears normal. No evidence of bowel wall thickening,  distention, or inflammatory changes. Vascular/Lymphatic: Aortic atherosclerosis. No enlarged abdominal or pelvic lymph nodes. Reproductive: Uterus and bilateral adnexa are unremarkable. Other: There is a small fat containing umbilical hernia. No ascites. Musculoskeletal: No acute or significant osseous findings. IMPRESSION: 1. New focal ground-glass opacities in the left upper lobe, likely infectious/inflammatory. Short-term follow-up CT recommended to confirm complete resolution and to exclude underlying neoplastic process. 2. No evidence for metastatic disease in the chest, abdomen or pelvis. 3. Status post left mastectomy and axillary lymph node dissection. 4. Mild wall thickening of the distal esophagus, nonspecific. Correlate clinically for esophagitis. Electronically Signed   By: ARonney AstersM.D.   On: 10/21/2021 23:23    Microbiology: No results found for this or any previous visit (from the past 240 hour(s)).   Labs: Basic Metabolic Panel: Recent Labs  Lab 10/21/21 1240 10/22/21 0037 10/23/21 0704 10/24/21 0734 10/25/21 0536 10/26/21 0548  NA 130* 130* 130* 131* 134* 136  K 3.4* 3.9 3.7 3.8 3.5 3.8  CL 98 100 99 100 102 104  CO2 24 24 24 25 26 26   GLUCOSE 404* 171* 366* 301* 277* 128*  BUN 13 10 12 16 14 16   CREATININE  0.57 0.50 0.51 0.51 0.60 0.59  CALCIUM 8.7* 8.7* 8.3* 8.5* 8.7* 9.0  MG 1.6* 1.5* 1.7 1.7 1.9  --    Liver Function Tests: Recent Labs  Lab 10/21/21 1240 10/22/21 0037  AST 14* 17  ALT 10 9  ALKPHOS 116 84  BILITOT 0.8 0.6  PROT 8.5* 7.8  ALBUMIN 3.3* 3.0*   No results for input(s): "LIPASE", "AMYLASE" in the last 168 hours. No results for input(s): "AMMONIA" in the last 168 hours. CBC: Recent Labs  Lab 10/22/21 1400 10/23/21 0704 10/24/21 0734 10/25/21 0536 10/26/21 0548  WBC 3.1* 3.4* 3.4* 3.4* 3.7*  NEUTROABS  --  1.2* 0.7* 0.6* 0.7*  HGB 8.8* 8.8* 8.9* 8.6* 8.9*  HCT 25.9* 25.9* 26.1* 25.6* 26.2*  MCV 97.7 97.0 97.0 98.1 97.8  PLT 42*  36* 30* 29* 30*   Cardiac Enzymes: No results for input(s): "CKTOTAL", "CKMB", "CKMBINDEX", "TROPONINI" in the last 168 hours. BNP: BNP (last 3 results) No results for input(s): "BNP" in the last 8760 hours.  ProBNP (last 3 results) No results for input(s): "PROBNP" in the last 8760 hours.  CBG: Recent Labs  Lab 10/25/21 1144 10/25/21 1723 10/25/21 2133 10/26/21 0748 10/26/21 1237  GLUCAP 175* 144* 275* 135* 147*       Signed:  Irine Seal MD.  [facility] 10/26/2021, 2:05 PM

## 2021-10-27 ENCOUNTER — Ambulatory Visit
Admission: RE | Admit: 2021-10-27 | Discharge: 2021-10-27 | Disposition: A | Discharge status: Home / Self Care | Payer: Medicare Other | Source: Ambulatory Visit | Attending: Nurse Practitioner | Admitting: Nurse Practitioner

## 2021-10-27 ENCOUNTER — Telehealth: Payer: Self-pay

## 2021-10-27 ENCOUNTER — Other Ambulatory Visit: Payer: Self-pay | Admitting: Hematology

## 2021-10-27 DIAGNOSIS — D61818 Other pancytopenia: Secondary | ICD-10-CM

## 2021-10-27 DIAGNOSIS — R2231 Localized swelling, mass and lump, right upper limb: Secondary | ICD-10-CM

## 2021-10-27 LAB — CBC WITH DIFFERENTIAL/PLATELET
Abs Immature Granulocytes: 0.01 10*3/uL (ref 0.00–0.07)
Basophils Absolute: 0 10*3/uL (ref 0.0–0.1)
Basophils Relative: 1 %
Eosinophils Absolute: 0.1 10*3/uL (ref 0.0–0.5)
Eosinophils Relative: 2 %
HCT: 25.6 % — ABNORMAL LOW (ref 36.0–46.0)
Hemoglobin: 8.6 g/dL — ABNORMAL LOW (ref 12.0–15.0)
Immature Granulocytes: 0 %
Lymphocytes Relative: 71 %
Lymphs Abs: 2.5 10*3/uL (ref 0.7–4.0)
MCH: 33 pg (ref 26.0–34.0)
MCHC: 33.6 g/dL (ref 30.0–36.0)
MCV: 98.1 fL (ref 80.0–100.0)
Monocytes Absolute: 0.3 10*3/uL (ref 0.1–1.0)
Monocytes Relative: 9 %
Neutro Abs: 0.6 10*3/uL — ABNORMAL LOW (ref 1.7–7.7)
Neutrophils Relative %: 17 %
Platelets: 29 10*3/uL — CL (ref 150–400)
RBC: 2.61 MIL/uL — ABNORMAL LOW (ref 3.87–5.11)
RDW: 18 % — ABNORMAL HIGH (ref 11.5–15.5)
WBC: 3.4 10*3/uL — ABNORMAL LOW (ref 4.0–10.5)
nRBC: 0 % (ref 0.0–0.2)

## 2021-10-27 LAB — SURGICAL PATHOLOGY

## 2021-10-27 LAB — EPSTEIN-BARR VIRUS VCA, IGG: EBV VCA IgG: >600 U/mL — ABNORMAL HIGH (ref 0.0–17.9)

## 2021-10-27 LAB — EPSTEIN-BARR VIRUS VCA, IGM: EBV VCA IgM: <36 U/mL (ref 0.0–35.9)

## 2021-10-27 NOTE — Progress Notes (Unsigned)
CT BONE MA

## 2021-10-27 NOTE — Patient Outreach (Addendum)
  Care Coordination TOC Note Transition Care Management Follow-up Telephone Call Date of discharge and from where: 10/26/21-[facility] How have you been since you were released from the hospital? Call completed with daughter-[name]. They are getting ready to go to CT appt this morning. She reports that patient rested well last night and has had a good dinner and breakfast since being at home. Daughter took patient to her home so she can assist patient with care needs. She reports that her neighbor is a Marine scientist for [employer] and has been helpful in assisting them as well. Daughter reports that patient was not taking insulin prior to admission and not consistent with monitoring cbgs. She is going to start helping patient monitor cbgs and administer insulin. Her neighbor is coming over today to provide them education about insulin administration. Patient left cbg machine at her home but daughter states she will go to the house and get it today.  Any questions or concerns? No  Items Reviewed: Did the pt receive and understand the discharge instructions provided? Yes  Medications obtained and verified?  Unable to complete med review as they had to prepare to leave for appt Other? Yes -Diabetes education provided Any new allergies since your discharge? No  Dietary orders reviewed? Yes Do you have support at home? Yes -temporarily staying with daughter  [place] and Equipment/Supplies: Were home health services ordered? yes If so, what is the name of the agency? [facility]  Has the agency set up a time to come to the patient's home? No-dtr advised that agency should contact within 24-48hrs post discharge. She has contact info to call if she does not hear from them Were any new equipment or medical supplies ordered?  No What is the name of the medical supply agency? N/a Were you able to get the supplies/equipment? not applicable Do you have any questions related to the use of the equipment or supplies?  No  Functional Questionnaire: (I = Independent and D = Dependent) ADLs: assist  Bathing/Dressing- Assist  Meal Prep- assist  Eating- I  Maintaining continence- I  Transferring/Ambulation- Assist  Managing Meds- Assist  Follow up appointments reviewed:  PCP Hospital f/u appt confirmed?  Daughter has not had a chance to call-just discharged yesterday. Will call office today to make appt.   [facility] f/u appt confirmed?  Scheduled to see [name] on 10/29/21 @ 8:20am. Are transportation arrangements needed? No  If their condition worsens, is the pt aware to call PCP or go to the Emergency Dept.? Yes Was the patient provided with contact information for the PCP's office or ED?  Was to pt encouraged to call back with questions or concerns? Yes  SDOH assessments and interventions completed:   Yes  Care Coordination Interventions Activated:     Care Coordination Interventions:  Referred for Care Coordination Services:  [place] scheduled with Clarise Jesse for 11/04/21 at 1pm.   Encounter Outcome:  Pt. Visit Completed    Enzo Montgomery, RN,BSN,CCM [place] [facility] Telephonic Care Management Coordinator Direct Phone: [phone] Toll Free: [phone] Fax: [phone]

## 2021-10-27 NOTE — Progress Notes (Unsigned)
Patient ID: Amanda Duarte, female   DOB: February 21, 1955, 66 y.o.   MRN: [id number]   After hospitalization 9/28-10/03/2021  From discharge summary: Recommendations for Outpatient Follow-up:  Follow-up with [name], hematology/oncology as scheduled on 10/29/2021. Follow-up with [name], NP in 2 weeks or as previously scheduled.  On follow-up patient blood pressure need to be reassessed as patient's HCTZ was discontinued.  Patient will need a basic metabolic profile done to follow-up on electrolytes and renal function.  Patient will need a magnesium level checked as well.  Patient's diabetes also need to be followed up.     Discharge Diagnoses:  Principal Problem:   Symptomatic anemia Active Problems:   Essential hypertension   DM2 (diabetes mellitus, type 2) (HCC)   Malignant neoplasm of upper-inner quadrant of left breast in female, estrogen receptor positive (HCC)   Thrombocytopenia (HCC)   Hyponatremia   Hypokalemia   Anxiety   Depression  History of present illness:  HPI per Dr [name] is a 66 y.o. female with medical history significant of HTN, HLD, anemia, anxiety/depression, DM2. Presenting with symptomatic anemia. She reports that she's had fatigue and intermittent shortness of breath for the last week. She has not had any chest pain, cough, fever or sick contacts. She has been following with her PCP about this matter. So labs were drawn during her last visit. It was noted that she was significantly anemic. It was recommended that she come to the ED for evaluation. She denies any hematochezia, hematemesis, hematuria. She denies any other aggravating or alleviating factors.    Hospital Course:  #1 severe symptomatic anemia/thrombocytopenia -Patient presenting generalized fatigue, intermittent shortness of breath, noted to have a severe symptomatic anemia with hemoglobin of 5.9 on admission, platelet count of 54. -FOBT done was negative. -Status posttransfusion 2 units  packed red blood cells hemoglobin stabilized at 8.9 by day of discharge.   -Platelet count during the hospitalization was trending down and went as low as 29 from 54 on admission.  Platelet count started to plateau and was 30 by day of discharge.  -Bone marrow biopsy was done at the direction of patient's hematology/oncologist on 10/25/2021 and results pending on discharge. -Patient denied any overt bleeding throughout the hospitalization. -CT chest abdomen and pelvis done per hematology/oncology recommendations with new focal groundglass opacities left upper lobe likely infectious/inflammatory, short-term follow-up CT recommended, no evidence of metastatic disease in the chest abdomen or pelvis, status post left mastectomy and axillary lymph node dissection, mild wall thickening of the distal esophagus, nonspecific, correlate clinically with esophagitis. -Patient seen in consultation by hematology/oncology, [name] who noted that patient previously received chemotherapy including Adriamycin which could potentially cause MDS and leukemia.  Blood smear review sent off.   -Patient's platelets were monitored once platelets plateaued and seem to be improving patient was deemed safe for discharge by hematology and will follow-up closely with hematology in the outpatient clinic on 10/29/2021 for repeat labs, follow-up on bone marrow biopsy results, further recommendations at that time.   2.  Poorly controlled diabetes mellitus type 2 -Hemoglobin A1c 13.3 (10/05/2021) -Patient's oral hypoglycemic agents were held during the hospitalization, patient maintained on Semglee dose uptitrated to 25 units daily as well as patient maintained on NovoLog meal coverage of 10 units 3 times daily with meals as well as sliding scale insulin.   -It was noted that patient has been noncompliant with her insulin at home as she was unable to give herself her  insulin. -Discussed with daughter who stated she would give her mother and  nighttime insulin dose daily. -CBGs improved, patient will be discharged on Lantus 25 units daily, home regimen sliding scale insulin, resumption of oral hypoglycemic agents.   -Outpatient follow-up with PCP.   3.  Hypokalemia/hyponatremia/hypomagnesemia -Patient noted to be on HCTZ prior to admission which has been discontinued.   -Patient maintained on home regimen potassium.   -Patient initially gently hydrated with IV fluids, HCTZ discontinued and hyponatremia improved and had resolved by day of discharge.   -Magnesium was repleted during the hospitalization.    4.  Depression/anxiety -Patient maintained on home regimen of Celexa and hydroxyzine.    5.  Hypertension -BP noted to be borderline on admission. -HCTZ held during this hospitalization and will not be resumed on discharge.   -ARB held and will be resumed on discharge.   -Help patient follow-up with PCP.     6.  History of breast cancer -Patient seen by oncology during the hospitalization.   -Patient already scheduled for outpatient axillary ultrasound on 08/27/2021.   -Outpatient follow-up with oncology.

## 2021-10-28 ENCOUNTER — Encounter: Payer: Self-pay | Admitting: Physician Assistant

## 2021-10-28 ENCOUNTER — Ambulatory Visit: Payer: Medicare Other | Attending: Physician Assistant | Admitting: Physician Assistant

## 2021-10-28 VITALS — BP 132/66 | HR 71 | Ht 60.0 in | Wt 116.0 lb

## 2021-10-28 DIAGNOSIS — I10 Essential (primary) hypertension: Secondary | ICD-10-CM | POA: Insufficient documentation

## 2021-10-28 DIAGNOSIS — Z91148 Patient's other noncompliance with medication regimen for other reason: Secondary | ICD-10-CM | POA: Diagnosis not present

## 2021-10-28 DIAGNOSIS — D649 Anemia, unspecified: Secondary | ICD-10-CM | POA: Insufficient documentation

## 2021-10-28 DIAGNOSIS — E785 Hyperlipidemia, unspecified: Secondary | ICD-10-CM | POA: Diagnosis not present

## 2021-10-28 DIAGNOSIS — F419 Anxiety disorder, unspecified: Secondary | ICD-10-CM | POA: Diagnosis not present

## 2021-10-28 DIAGNOSIS — R21 Rash and other nonspecific skin eruption: Secondary | ICD-10-CM | POA: Diagnosis not present

## 2021-10-28 DIAGNOSIS — Z853 Personal history of malignant neoplasm of breast: Secondary | ICD-10-CM | POA: Insufficient documentation

## 2021-10-28 DIAGNOSIS — E1165 Type 2 diabetes mellitus with hyperglycemia: Secondary | ICD-10-CM | POA: Diagnosis not present

## 2021-10-28 DIAGNOSIS — Z09 Encounter for follow-up examination after completed treatment for conditions other than malignant neoplasm: Secondary | ICD-10-CM

## 2021-10-28 DIAGNOSIS — E871 Hypo-osmolality and hyponatremia: Secondary | ICD-10-CM | POA: Diagnosis not present

## 2021-10-28 DIAGNOSIS — Z789 Other specified health status: Secondary | ICD-10-CM

## 2021-10-28 DIAGNOSIS — E876 Hypokalemia: Secondary | ICD-10-CM | POA: Insufficient documentation

## 2021-10-28 DIAGNOSIS — C50212 Malignant neoplasm of upper-inner quadrant of left female breast: Secondary | ICD-10-CM | POA: Insufficient documentation

## 2021-10-28 DIAGNOSIS — Z170 Estrogen receptor positive status [ER+]: Secondary | ICD-10-CM | POA: Insufficient documentation

## 2021-10-28 DIAGNOSIS — F32A Depression, unspecified: Secondary | ICD-10-CM | POA: Insufficient documentation

## 2021-10-28 DIAGNOSIS — Z794 Long term (current) use of insulin: Secondary | ICD-10-CM | POA: Diagnosis not present

## 2021-10-28 DIAGNOSIS — D696 Thrombocytopenia, unspecified: Secondary | ICD-10-CM | POA: Insufficient documentation

## 2021-10-28 DIAGNOSIS — Z760 Encounter for issue of repeat prescription: Secondary | ICD-10-CM | POA: Insufficient documentation

## 2021-10-28 LAB — POCT GLYCOSYLATED HEMOGLOBIN (HGB A1C): HbA1c, POC (controlled diabetic range): 11.4 % — AB (ref 0.0–7.0)

## 2021-10-28 LAB — GLUCOSE, POCT (MANUAL RESULT ENTRY): POC Glucose: 303 mg/dl — AB (ref 70–99)

## 2021-10-28 LAB — SURGICAL PATHOLOGY

## 2021-10-28 MED ORDER — ACCU-CHEK SOFTCLIX LANCETS MISC: 3 refills | Status: DC

## 2021-10-28 MED ORDER — ACCU-CHEK GUIDE VI STRP: ORAL_STRIP | 11 refills | Status: DC

## 2021-10-28 NOTE — Patient Instructions (Signed)
Check blood sugars fasting and at bedtime and record and bring to next visit.   Buy new batteries and change the batteries for your blood sugar device

## 2021-10-29 ENCOUNTER — Other Ambulatory Visit: Payer: Self-pay

## 2021-10-29 ENCOUNTER — Inpatient Hospital Stay (HOSPITAL_BASED_OUTPATIENT_CLINIC_OR_DEPARTMENT_OTHER): Payer: Medicare Other | Admitting: Hematology

## 2021-10-29 ENCOUNTER — Encounter: Payer: Self-pay | Admitting: Hematology

## 2021-10-29 ENCOUNTER — Inpatient Hospital Stay: Payer: Medicare Other | Attending: Hematology

## 2021-10-29 ENCOUNTER — Other Ambulatory Visit: Payer: Self-pay | Admitting: Hematology

## 2021-10-29 ENCOUNTER — Telehealth: Payer: Self-pay

## 2021-10-29 VITALS — BP 116/52 | HR 71 | Temp 98.6°F | Resp 17 | Wt 116.0 lb

## 2021-10-29 DIAGNOSIS — Z79899 Other long term (current) drug therapy: Secondary | ICD-10-CM | POA: Insufficient documentation

## 2021-10-29 DIAGNOSIS — Z170 Estrogen receptor positive status [ER+]: Secondary | ICD-10-CM | POA: Diagnosis not present

## 2021-10-29 DIAGNOSIS — D61818 Other pancytopenia: Secondary | ICD-10-CM | POA: Insufficient documentation

## 2021-10-29 DIAGNOSIS — D469 Myelodysplastic syndrome, unspecified: Secondary | ICD-10-CM | POA: Insufficient documentation

## 2021-10-29 DIAGNOSIS — C50212 Malignant neoplasm of upper-inner quadrant of left female breast: Secondary | ICD-10-CM

## 2021-10-29 LAB — CMP (CANCER CENTER ONLY)
ALT: 6 U/L (ref 0–44)
AST: 13 U/L — ABNORMAL LOW (ref 15–41)
Albumin: 3.5 g/dL (ref 3.5–5.0)
Alkaline Phosphatase: 91 U/L (ref 38–126)
Anion gap: 7 (ref 5–15)
BUN: 17 mg/dL (ref 8–23)
CO2: 28 mmol/L (ref 22–32)
Calcium: 9 mg/dL (ref 8.9–10.3)
Chloride: 102 mmol/L (ref 98–111)
Creatinine: 0.58 mg/dL (ref 0.44–1.00)
GFR, Estimated: >60 mL/min (ref >60)
Glucose, Bld: 240 mg/dL — ABNORMAL HIGH (ref 70–99)
Potassium: 3.9 mmol/L (ref 3.5–5.1)
Sodium: 137 mmol/L (ref 135–145)
Total Bilirubin: 0.5 mg/dL (ref 0.3–1.2)
Total Protein: 8.4 g/dL — ABNORMAL HIGH (ref 6.5–8.1)

## 2021-10-29 LAB — CBC WITH DIFFERENTIAL (CANCER CENTER ONLY)
Abs Immature Granulocytes: 0 10*3/uL (ref 0.00–0.07)
Basophils Absolute: 0 10*3/uL (ref 0.0–0.1)
Basophils Relative: 0 %
Eosinophils Absolute: 0.1 10*3/uL (ref 0.0–0.5)
Eosinophils Relative: 5 %
HCT: 25.1 % — ABNORMAL LOW (ref 36.0–46.0)
Hemoglobin: 8.5 g/dL — ABNORMAL LOW (ref 12.0–15.0)
Lymphocytes Relative: 74 %
Lymphs Abs: 2.1 10*3/uL (ref 0.7–4.0)
MCH: 32.6 pg (ref 26.0–34.0)
MCHC: 33.9 g/dL (ref 30.0–36.0)
MCV: 96.2 fL (ref 80.0–100.0)
Monocytes Absolute: 0.2 10*3/uL (ref 0.1–1.0)
Monocytes Relative: 6 %
Myelocytes: 1 %
Neutro Abs: 0.4 10*3/uL — CL (ref 1.7–7.7)
Neutrophils Relative %: 14 %
Platelet Count: 42 10*3/uL — ABNORMAL LOW (ref 150–400)
RBC: 2.61 MIL/uL — ABNORMAL LOW (ref 3.87–5.11)
RDW: 17.8 % — ABNORMAL HIGH (ref 11.5–15.5)
WBC Count: 2.9 10*3/uL — ABNORMAL LOW (ref 4.0–10.5)
nRBC: 0 % (ref 0.0–0.2)

## 2021-10-29 NOTE — Telephone Encounter (Signed)
CRITICAL VALUE STICKER  CRITICAL VALUE: ANC 0.4  RECEIVER (on-site recipient of call): Horris Speros   DATE & TIME NOTIFIED: 10/29/21, 5271   MESSENGER (representative from lab): Suanne Marker   MD NOTIFIED: feng   TIME OF NOTIFICATION:0938   RESPONSE: MD made aware

## 2021-10-29 NOTE — Progress Notes (Signed)
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 

## 2021-10-29 NOTE — Progress Notes (Signed)
[place]   [phone] [phone]   Clinic Follow up Note   Patient Care Team: [name], NP as PCP - General (Nurse Practitioner) [name], MD as Consulting Physician (Hematology) Fanny Skates, MD as Consulting Physician (General Surgery) Alla Feeling, NP as Nurse Practitioner (Nurse Practitioner) Gery Pray, MD as Consulting Physician (Radiation Oncology)  Date of Service:  10/29/2021  CHIEF COMPLAINT: f/u of anemia, h/o breast cancer  CURRENT THERAPY:  Pending  ASSESSMENT & PLAN:  Amanda Duarte is a 66 y.o. female with   1. 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 -CT CAP showed no evidence for metastatic disease. -bone marrow biopsy on 10/25/21 showed, within limited material: hypercellular bone marrow with features of myeloid neoplasm. Flow cytometry showed 5% myeloblastic cells, no monoclonal B-cell, nonspecific T cell changes. Repeat biopsy was recommended. -I reviewed the results with them today. A repeat BMB is being scheduled for next week (11/03/21). I discussed that while this is not as informative due to the limited material, the biopsy still shows abnormal marrow, which most consistent with MDS with eccess blasts. I explained the possibility that this is from her previous chemo Adriamycin. If this is the case, she is at higher risk for this to turn into leukemia. I reviewed the standard treatment for MDS is chemotherapy consisting of Azacitadine. We discussed that this is given for 5+2 days (weekends off) every 4 weeks, and can be given as an infusion or an injection. I explained the differences between the infusion, for which a port is recommended, and injection. I recommend to let her try injection to see how she does before making a decision about which to proceed with. -after  lengthy discussion, patient agrees to try a azacitidine injection. Chemo consent obtained today. -I will likely refer her to [facility] pulmonary transplant program  2. 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 She had excellent response. -most recent right mammogram on 07/26/21 and right axilla [name] on 10/27/21 were benign.  3. Pancytopenia -secondary to her MDS -We will give blood transfusion if hemoglobin less than 8, and platelet transfusion if platelet less than 20 K -No G-CSF due to the excessive blasts in the marrow -We will give leukoreduced and irradiated blood products in the future given the possibility she may need a bone marrow transplant.  PLAN: -repeat bone marrow biopsy 10/11 -lab and f/u with NP  around 10/18 to review lab and BM biopsy results  -plan to start Azacitadine on 10/23 -lab and f/u with me on 10/27  -lab on 10/23 and 10/30   No problem-specific Assessment & Plan notes found for this encounter.   SUMMARY OF ONCOLOGIC HISTORY: Oncology History Overview Note  Cancer Staging Malignant neoplasm of upper-inner quadrant of left breast in female, estrogen receptor positive ([place]) Staging form: Breast, AJCC 8th Edition - Clinical stage from 05/01/2017: Stage Unknown (cTX, cN2, cM0, G3, ER-, PR-, HER2-) - Signed by [name], MD on 11/01/2017 - Pathologic stage from 11/20/2017: No Stage Recommended (ypT0, pN3mi, cM0, GX, ER-, PR-, HER2-) - Signed by [name], MD on 12/10/2017     Malignant neoplasm of upper-inner quadrant of left breast in female, estrogen receptor positive ([place])  04/28/2017 Mammogram   IMPRESSION: Two adjacent masses/enlarged lymph nodes in the LOWER LEFT axilla, the largest measuring 2.1 cm. Tissue sampling of 1 of these is recommended to exclude malignancy/lymphoma. No  mammographic evidence of breast malignancy bilaterally.    05/01/2017 Initial Biopsy   Diagnosis 05/01/17 Lymph node, needle/core biopsy, low left inferior axillary - METASTATIC POORLY DIFFERENTIATED CARCINOMA TO A LYMPH NODE. SEE NOTE.   05/01/2017 Cancer Staging   Staging form: Breast, AJCC 8th Edition - Clinical stage from 05/01/2017: Stage Unknown (cTX, cN1, cM0, G3, ER-, PR-, HER2-) - Signed by [name], MD on 06/02/2017    05/01/2017 Receptors her2   Lymph Node Biopsy:  HER2-Negative  PR-Negative  ER- Negative    05/10/2017 Imaging   MR Breast W WO Contrast 05/10/17 IMPRESSION: No MRI evidence of malignancy in the right breast. Area of week stippled non mass enhancement in the left breast upper inner quadrant. Separate area of thin linear non mass enhancement in the subareolar left breast. Three grossly abnormal left axillary lymph nodes, and more than 4 less than 1 cm indeterminate left axillary lymph nodes. No evidence of right axillary lymphadenopathy.   05/17/2017 Initial Biopsy   Diagnosis 05/17/17 1. 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 2. Breast, left, needle core biopsy, upper inner post MR enhancement - MICROSCOPIC FOCUS OF DUCTAL CARCINOMA IN SITU.   05/17/2017 Receptors her2   Left Breast Biopsy:  ER-Negative PR-Negative HER2-Negative   05/22/2017 Initial Diagnosis   Malignant neoplasm of upper-inner quadrant of left breast in female, estrogen receptor positive ([place])   06/01/2017 Imaging   [place] Scan 06/01/17 IMPRESSION: No definite scintigraphic evidence of osseous metastatic disease.   Posttraumatic and postsurgical uptake at the LEFT knee.   Nonspecific soft tissue distribution of tracer at the LEFT thigh, could represent contusion, hemorrhage, soft tissue edema, or soft tissue calcifications such as from heterotopic calcification and myositis ossificans; recommend clinical  correlation and consider dedicated LEFT femoral radiographs.   Single focus of nonspecific increased tracer localization at the lateral LEFT orbit.     06/01/2017 Imaging   06/01/2017 Bone Scan IMPRESSION: No definite scintigraphic evidence of osseous metastatic disease.   Posttraumatic and postsurgical uptake at the LEFT knee.   Nonspecific soft tissue distribution of tracer at the LEFT thigh, could represent contusion, hemorrhage, soft tissue edema, or soft tissue calcifications such as from heterotopic calcification and myositis ossificans; recommend clinical correlation and consider dedicated LEFT femoral radiographs.   Single focus of nonspecific increased tracer localization at the lateral LEFT orbit.   06/09/2017 -  Chemotherapy   ddAC every 2 weeks for 4 weeks starting 06/09/17-07/21/17 followed by weekly [place] and taxol for 12 weeks 08/04/17-10/20/17.    10/24/2017 Imaging   Breast MRI B/l 10/24/17 IMPRESSION: 1. No residual enhancement in the LEFT breast following neoadjuvant treatment. 2. Significantly smaller LEFT axillary lymph nodes, largest now measuring 1.4 centimeters. The remainder of LEFT axillary lymph nodes demonstrate normal fatty hila. RECOMMENDATION: Treatment plan for known LEFT breast cancer.   11/20/2017 Cancer Staging   Staging form: Breast, AJCC 8th Edition - Pathologic stage from 11/20/2017: No Stage Recommended (ypT0, pN60m, cM0, GX, ER-, PR-, HER2-) - Signed by [name] on 12/10/2017   11/20/2017 Surgery   Left mastectomy and SLN biopsy by Dr. IDalbert Batman   11/20/2017 Pathology Results   Breast, modified radical mastectomy , left - MICROSCOPIC FOCI OF RESIDUAL METASTATIC CARCINOMA  INVOLVING TWO OF EIGHT LYMPH NODES (2/8); LARGEST CONTINUOUS FOCUS MEASURES 0.1 CM. - NO EVIDENCE OF RESIDUAL CARCINOMA IN THE MASTECTOMY SPECIMEN - MARKED THERAPY-RELATED CHANGES, INCLUDING DENSE HYALIN FIBROSIS     11/20/2017 Receptors her2   ER- PR- HER2- (IHC  1+)   01/02/2018 - 02/14/2018 Radiation Therapy   Adjuvant Radiation 01/02/18 - 02/14/18   07/31/2018 Imaging   Baseline DEXA 07/31/18  ASSESSMENT: The BMD measured at AP Spine L1-L4 is 0.973 g/cm2 with a T-score of -1.7.   This patient is considered OSTEOPENIC according to [facility]) criteria. The scan quality is good.   Site Region Measured Date Measured Age YA T-score BMD Significant CHANGE   AP Spine  L1-L4      07/31/2018    63.1         -1.7    0.973 g/cm2   DualFemur Neck Left  07/31/2018    63.1         -1.5    0.828 g/cm2   DualFemur Total Mean 07/31/2018    63.1         0.1     1.016 g/cm2   11/05/2018 Survivorship   Per [name], NP    11/15/2021 -  Chemotherapy   Patient is on Treatment Plan : MYELODYSPLASIA  Azacitidine SQ D1-7 q28d        INTERVAL HISTORY:  Amanda Duarte is here for a follow up of anemia. She was last seen by me on 10/22/21 while she was in the hospital. She presents to the clinic accompanied by her daughter. She reports she is recovering well since hospital discharge.   All other systems were reviewed with the patient and are negative.  MEDICAL HISTORY:  Past Medical History:  Diagnosis Date   Cancer (Standard) 03/2017   left breast cancer   Diabetes mellitus without complication ([place])    Hyperlipidemia    Hypertension    Malignant neoplasm of left breast ([place])    Stroke (cerebrum) ([place])    Tibial plateau fracture, left    04-13-17 had ORIF    SURGICAL HISTORY: Past Surgical History:  Procedure Laterality Date   MASTECTOMY Left 2019   MASTECTOMY MODIFIED RADICAL Left 11/20/2017   Procedure: LEFT MODIFIED RADICAL MASTECTOMY;  Surgeon: Fanny Skates, MD;  Location: [facility];  Service: General;  Laterality: Left;   ORIF TIBIA PLATEAU Left 04/13/2017   Procedure: OPEN REDUCTION INTERNAL FIXATION (ORIF) TIBIAL PLATEAU;  Surgeon: Shona Needles, MD;  Location: [facility];  Service: Orthopedics;  Laterality: Left;   PORT-A-CATH  REMOVAL Right 11/20/2017   Procedure: REMOVAL PORT-A-CATH;  Surgeon: Fanny Skates, MD;  Location: [facility];  Service: General;  Laterality: Right;   PORTACATH PLACEMENT Right 05/31/2017   Procedure: INSERTION PORT-A-CATH;  Surgeon: Fanny Skates, MD;  Location: [place];  Service: General;  Laterality: Right;    I have reviewed the social history and family history with the patient and they are unchanged from previous note.  ALLERGIES:  is allergic to victoza [liraglutide].  MEDICATIONS:  Current Outpatient Medications  Medication Sig Dispense Refill   Accu-Chek Softclix Lancets lancets Use to check blood sugar 3 times daily. E11.65 100 each 3   atorvastatin (LIPITOR) 40 MG tablet Take 1 tablet (40 mg total) by mouth daily. 90 tablet 2   Blood Glucose Monitoring Suppl (ACCU-CHEK GUIDE) w/Device KIT use kit to check blood glucose three times daily 1 kit 0   Blood Pressure Monitor DEVI Please provide  patient with insurance approved blood pressure monitor 1 each 0   citalopram (CELEXA) 20 MG tablet Take 1 tablet (20 mg total) by mouth daily. For depression 90 tablet 3   fenofibrate (TRICOR) 145 MG tablet Take 1 tablet (145 mg total) by mouth daily. 90 tablet 2   glimepiride (AMARYL) 4 MG tablet Take 1 tablet (4 mg total) by mouth in the morning and at bedtime. 180 tablet 1   glucose blood (ACCU-CHEK GUIDE) test strip Use to check blood sugar 3 times daily. E11.65 100 each 11   hydrOXYzine (ATARAX) 10 MG tablet Take 1 tablet (10 mg total) by mouth 3 (three) times daily as needed for anxiety. 60 tablet 3   insulin glargine (LANTUS) 100 UNIT/ML Solostar Pen Inject 25 Units into the skin at bedtime. 36 mL 1   insulin lispro (HUMALOG) 100 UNIT/ML injection Inject 0.04 mLs (4 Units total) into the skin 3 (three) times daily before meals. 10 mL 11   Insulin Pen Needle 31G X 5 MM MISC USE AS INSTRUCTED. INJECT INTO THE SKIN ONCE NIGHTLY. 100 each 6   Insulin Syringe-Needle U-100 30G X  5/16" 1 ML MISC Use as directed to inject into the skin 3 times daily. 200 each 6   losartan (COZAAR) 50 MG tablet Take 1 tablet (50 mg total) by mouth daily. 90 tablet 1   pantoprazole (PROTONIX) 40 MG tablet Take 1 tablet (40 mg total) by mouth daily at 6 (six) AM. 30 tablet 1   potassium chloride SA (KLOR-CON M) 20 MEQ tablet Take 1 tablet (20 mEq total) by mouth daily. 30 tablet 3   sitaGLIPtin-metformin (JANUMET) 50-1000 MG tablet TAKE 1 TABLET BY MOUTH 2 TIMES DAILY 180 tablet 1   No current facility-administered medications for this visit.    PHYSICAL EXAMINATION: ECOG PERFORMANCE STATUS: 1 - Symptomatic but completely ambulatory  Vitals:   10/29/21 0841  BP: (!) 116/52  Pulse: 71  Resp: 17  Temp: 98.6 F (37 C)  SpO2: 100%   Wt Readings from Last 3 Encounters:  10/29/21 116 lb (52.6 kg)  10/28/21 116 lb (52.6 kg)  10/23/21 114 lb 1.6 oz (51.8 kg)     GENERAL:alert, no distress and comfortable SKIN: skin color normal, no rashes or significant lesions EYES: normal, Conjunctiva are pink and non-injected, sclera clear  NEURO: alert & oriented x 3 with fluent speech  LABORATORY DATA:  I have reviewed the data as listed    Latest Ref Rng & Units 10/29/2021    8:22 AM 10/26/2021    5:48 AM 10/25/2021    5:36 AM  CBC  WBC 4.0 - 10.5 K/uL 2.9  3.7  3.4   Hemoglobin 12.0 - 15.0 g/dL 8.5  8.9  8.6   Hematocrit 36.0 - 46.0 % 25.1  26.2  25.6   Platelets 150 - 400 K/uL 42  30  29         Latest Ref Rng & Units 10/29/2021    8:22 AM 10/26/2021    5:48 AM 10/25/2021    5:36 AM  CMP  Glucose 70 - 99 mg/dL 240  128  277   BUN 8 - 23 mg/dL 17  16  14    Creatinine 0.44 - 1.00 mg/dL 0.58  0.59  0.60   Sodium 135 - 145 mmol/L 137  136  134   Potassium 3.5 - 5.1 mmol/L 3.9  3.8  3.5   Chloride 98 - 111 mmol/L 102  104  102  CO2 22 - 32 mmol/L 28  26  26    Calcium 8.9 - 10.3 mg/dL 9.0  9.0  8.7   Total Protein 6.5 - 8.1 g/dL 8.4     Total Bilirubin 0.3 - 1.2 mg/dL 0.5      Alkaline Phos 38 - 126 U/L 91     AST 15 - 41 U/L 13     ALT 0 - 44 U/L 6         RADIOGRAPHIC STUDIES: I have personally reviewed the radiological images as listed and agreed with the findings in the report. [name] AXILLA RIGHT  Result Date: 10/27/2021 CLINICAL DATA:  Short-term follow-up for probably benign right axillary lymph nodes, initially noted on a screening exam dated 02/16/2021, followed up with repeat ultrasound on 04/21/2021 and diagnostic imaging and ultrasound on 07/26/2021. History of a left mastectomy for breast carcinoma. EXAM: ULTRASOUND OF THE RIGHT AXILLA COMPARISON:  Prior exams FINDINGS: Ultrasound of the right axilla is performed, showing normal lymph nodes. The current exam, all of the cortices of the normal lymph nodes are less than 2 mm. There are no enlarged or abnormal lymph nodes. IMPRESSION: 1. Normal right axillary lymph nodes. No additional follow-up recommended. RECOMMENDATION: 1. Annual screening right breast mammography. Last right breast mammography performed on 07/26/2021. I have discussed the findings and recommendations with the patient. If applicable, a reminder letter will be sent to the patient regarding the next appointment. BI-RADS CATEGORY  1: Negative. Electronically Signed   By: Lajean Manes M.D.   On: 10/27/2021 11:33     Orders Placed This Encounter  Procedures   CBC with Differential ([place] Only)    Standing Status:   Future    Standing Expiration Date:   98/10/2546   Basic Metabolic Panel - [place] Only    Standing Status:   Future    Standing Expiration Date:   11/16/2022   CBC with Differential ([place] Only)    Standing Status:   Future    Standing Expiration Date:   62/82/4175   Basic Metabolic Panel - [place] Only    Standing Status:   Future    Standing Expiration Date:   11/23/2022   All questions were answered. The patient knows to call the clinic with any problems, questions or concerns. No barriers to  learning was detected. The total time spent in the appointment was 40 minutes.     [name], MD 10/29/2021   I, Wilburn Mylar, am acting as scribe for [name], MD.   I have reviewed the above documentation for accuracy and completeness, and I agree with the above.

## 2021-10-30 ENCOUNTER — Other Ambulatory Visit: Payer: Self-pay

## 2021-11-01 ENCOUNTER — Encounter (HOSPITAL_COMMUNITY): Payer: Self-pay | Admitting: Hematology

## 2021-11-02 ENCOUNTER — Encounter (HOSPITAL_COMMUNITY): Payer: Self-pay | Admitting: Hematology

## 2021-11-02 ENCOUNTER — Other Ambulatory Visit: Payer: Self-pay | Admitting: Radiology

## 2021-11-02 ENCOUNTER — Telehealth: Payer: Self-pay | Admitting: Nurse Practitioner

## 2021-11-02 DIAGNOSIS — D61818 Other pancytopenia: Secondary | ICD-10-CM

## 2021-11-02 NOTE — Telephone Encounter (Signed)
Amanda Duarte visited the pt and she stated that the Pt has been previously prescribed Humalog but has none in her home   [place] Orders - Caller/Agency: Amanda Duarte / [facility] Number: [phone] vm can be left  Requesting Skilled Nursing Frequency: 1x a week for 1 week and  2x's a week for 3 weeks

## 2021-11-02 NOTE — Telephone Encounter (Signed)
Called Kelly back for [facility] orders: LVM-    Home Health Verbal Orders - Callback Number: [phone] vm can be left  Requesting Skilled Nursing Frequency: 1x a week for 1 week and  2x's a week for 3 weeks

## 2021-11-03 ENCOUNTER — Other Ambulatory Visit: Payer: Self-pay

## 2021-11-03 ENCOUNTER — Ambulatory Visit (HOSPITAL_COMMUNITY)
Admission: RE | Admit: 2021-11-03 | Discharge: 2021-11-03 | Disposition: A | Discharge status: Home / Self Care | Payer: Medicare Other | Source: Ambulatory Visit

## 2021-11-03 ENCOUNTER — Ambulatory Visit (HOSPITAL_COMMUNITY)
Admission: RE | Admit: 2021-11-03 | Discharge: 2021-11-03 | Disposition: A | Discharge status: Home / Self Care | Payer: Medicare Other | Source: Ambulatory Visit | Attending: Hematology | Admitting: Hematology

## 2021-11-03 ENCOUNTER — Encounter (HOSPITAL_COMMUNITY): Payer: Self-pay

## 2021-11-03 ENCOUNTER — Other Ambulatory Visit: Payer: Self-pay | Admitting: Hematology

## 2021-11-03 DIAGNOSIS — D649 Anemia, unspecified: Secondary | ICD-10-CM

## 2021-11-03 DIAGNOSIS — D61818 Other pancytopenia: Secondary | ICD-10-CM | POA: Insufficient documentation

## 2021-11-03 DIAGNOSIS — Z923 Personal history of irradiation: Secondary | ICD-10-CM | POA: Diagnosis not present

## 2021-11-03 DIAGNOSIS — Z9012 Acquired absence of left breast and nipple: Secondary | ICD-10-CM | POA: Insufficient documentation

## 2021-11-03 DIAGNOSIS — Z170 Estrogen receptor positive status [ER+]: Secondary | ICD-10-CM

## 2021-11-03 DIAGNOSIS — F419 Anxiety disorder, unspecified: Secondary | ICD-10-CM | POA: Diagnosis not present

## 2021-11-03 DIAGNOSIS — D469 Myelodysplastic syndrome, unspecified: Secondary | ICD-10-CM | POA: Diagnosis not present

## 2021-11-03 DIAGNOSIS — E785 Hyperlipidemia, unspecified: Secondary | ICD-10-CM | POA: Insufficient documentation

## 2021-11-03 DIAGNOSIS — I10 Essential (primary) hypertension: Secondary | ICD-10-CM | POA: Insufficient documentation

## 2021-11-03 DIAGNOSIS — F32A Depression, unspecified: Secondary | ICD-10-CM | POA: Diagnosis not present

## 2021-11-03 DIAGNOSIS — Z1379 Encounter for other screening for genetic and chromosomal anomalies: Secondary | ICD-10-CM | POA: Insufficient documentation

## 2021-11-03 DIAGNOSIS — E119 Type 2 diabetes mellitus without complications: Secondary | ICD-10-CM | POA: Diagnosis not present

## 2021-11-03 LAB — CBC WITH DIFFERENTIAL/PLATELET
Abs Immature Granulocytes: 0 10*3/uL (ref 0.00–0.07)
Basophils Absolute: 0 10*3/uL (ref 0.0–0.1)
Basophils Relative: 1 %
Blasts: 3 %
Eosinophils Absolute: 0.1 10*3/uL (ref 0.0–0.5)
Eosinophils Relative: 3 %
HCT: 22.7 % — ABNORMAL LOW (ref 36.0–46.0)
Hemoglobin: 7.6 g/dL — ABNORMAL LOW (ref 12.0–15.0)
Lymphocytes Relative: 64 %
Lymphs Abs: 2.6 10*3/uL (ref 0.7–4.0)
MCH: 32.5 pg (ref 26.0–34.0)
MCHC: 33.5 g/dL (ref 30.0–36.0)
MCV: 97 fL (ref 80.0–100.0)
Monocytes Absolute: 0.2 10*3/uL (ref 0.1–1.0)
Monocytes Relative: 6 %
Neutro Abs: 0.9 10*3/uL — ABNORMAL LOW (ref 1.7–7.7)
Neutrophils Relative %: 23 %
Platelets: 52 10*3/uL — ABNORMAL LOW (ref 150–400)
RBC: 2.34 MIL/uL — ABNORMAL LOW (ref 3.87–5.11)
RDW: 18 % — ABNORMAL HIGH (ref 11.5–15.5)
WBC: 4 10*3/uL (ref 4.0–10.5)
nRBC: 0.5 % — ABNORMAL HIGH (ref 0.0–0.2)

## 2021-11-03 LAB — TYPE AND SCREEN
ABO/RH(D): O POS
Antibody Screen: NEGATIVE

## 2021-11-03 LAB — PREPARE RBC (CROSSMATCH)

## 2021-11-03 LAB — GLUCOSE, CAPILLARY: Glucose-Capillary: 183 mg/dL — ABNORMAL HIGH (ref 70–99)

## 2021-11-03 MED ORDER — FENTANYL CITRATE (PF) 100 MCG/2ML IJ SOLN
INTRAMUSCULAR | Status: AC | PRN
  Administered 2021-11-03 (×2): 50 ug via INTRAVENOUS

## 2021-11-03 MED ORDER — FENTANYL CITRATE (PF) 100 MCG/2ML IJ SOLN
INTRAMUSCULAR | Status: AC
  Filled 2021-11-03: qty 2

## 2021-11-03 MED ORDER — MIDAZOLAM HCL 2 MG/2ML IJ SOLN
INTRAMUSCULAR | Status: AC
  Filled 2021-11-03: qty 4

## 2021-11-03 MED ORDER — SODIUM CHLORIDE 0.9 % IV SOLN: INTRAVENOUS | Status: DC

## 2021-11-03 MED ORDER — MIDAZOLAM HCL 2 MG/2ML IJ SOLN
INTRAMUSCULAR | Status: AC | PRN
  Administered 2021-11-03 (×2): 1 mg via INTRAVENOUS

## 2021-11-03 NOTE — Procedures (Signed)
Interventional Radiology Procedure Note  Procedure: CT guided aspirate and core biopsy of right posterior iliac bone Complications: None Recommendations: - Bedrest supine x 1 hrs - OTC's PRN  Pain - Follow biopsy results  Signed,  Markhi Kleckner S. Wadsworth Skolnick, DO    

## 2021-11-03 NOTE — Discharge Instructions (Addendum)
Discharge Instructions:   Please call Interventional Radiology clinic [phone] with any questions or concerns.  Go to the [facility] at 12 noon for blood transfusion arrive 15 minutes early to register, tomorrow 11-04-21  You may remove your dressing and shower tomorrow< or may take the Band-Aid off today.  Can replace the band-Aid if you would like too.   Bone Marrow Aspiration and Bone Marrow Biopsy, Adult, Care After This sheet gives you information about how to care for yourself after your procedure. Your health care provider may also give you more specific instructions. If you have problems or questions, contact your health care provider. What can I expect after the procedure? After the procedure, it is common to have: Mild pain and tenderness. Swelling. Bruising. Follow these instructions at home: Puncture site care  Follow instructions from your health care provider about how to take care of the puncture site. Make sure you: Wash your hands with soap and water before and after you change your bandage (dressing). If soap and water are not available, use hand sanitizer. Change your dressing as told by your health care provider. Check your puncture site every day for signs of infection. Check for: More redness, swelling, or pain. Fluid or blood. Warmth. Pus or a bad smell. Activity Return to your normal activities as told by your health care provider. Ask your health care provider what activities are safe for you. Do not lift anything that is heavier than 10 lb (4.5 kg), or the limit that you are told, until your health care provider says that it is safe. Do not drive for 24 hours if you were given a sedative during your procedure. General instructions  Take over-the-counter and prescription medicines only as told by your health care provider. Do not take baths, swim, or use a hot tub until your health care provider approves. Ask your health care provider if you may take  showers. You may only be allowed to take sponge baths. If directed, put ice on the affected area. To do this: Put ice in a plastic bag. Place a towel between your skin and the bag. Leave the ice on for 20 minutes, 2-3 times a day. Keep all follow-up visits as told by your health care provider. This is important. Contact a health care provider if: Your pain is not controlled with medicine. You have a fever. You have more redness, swelling, or pain around the puncture site. You have fluid or blood coming from the puncture site. Your puncture site feels warm to the touch. You have pus or a bad smell coming from the puncture site. Summary After the procedure, it is common to have mild pain, tenderness, swelling, and bruising. Follow instructions from your health care provider about how to take care of the puncture site and what activities are safe for you. Take over-the-counter and prescription medicines only as told by your health care provider. Contact a health care provider if you have any signs of infection, such as fluid or blood coming from the puncture site. This information is not intended to replace advice given to you by your health care provider. Make sure you discuss any questions you have with your health care provider. Document Revised: 05/29/2018 Document Reviewed: 05/29/2018 Elsevier Patient Education  [place].   Moderate Conscious Sedation, Adult, Care After This sheet gives you information about how to care for yourself after your procedure. Your health care provider may also give you more specific instructions. If you have problems  or questions, contact your health care provider. What can I expect after the procedure? After the procedure, it is common to have: Sleepiness for several hours. Impaired judgment for several hours. Difficulty with balance. Vomiting if you eat too soon. Follow these instructions at home: For the time period you were told by your  health care provider: Rest. Do not participate in activities where you could fall or become injured. Do not drive or use machinery. Do not drink alcohol. Do not take sleeping pills or medicines that cause drowsiness. Do not make important decisions or sign legal documents. Do not take care of children on your own. Eating and drinking  Follow the diet recommended by your health care provider. Drink enough fluid to keep your urine pale yellow. If you vomit: Drink water, juice, or soup when you can drink without vomiting. Make sure you have little or no nausea before eating solid foods. General instructions Take over-the-counter and prescription medicines only as told by your health care provider. Have a responsible adult stay with you for the time you are told. It is important to have someone help care for you until you are awake and alert. Do not smoke. Keep all follow-up visits as told by your health care provider. This is important. Contact a health care provider if: You are still sleepy or having trouble with balance after 24 hours. You feel light-headed. You keep feeling nauseous or you keep vomiting. You develop a rash. You have a fever. You have redness or swelling around the IV site. Get help right away if: You have trouble breathing. You have new-onset confusion at home. Summary After the procedure, it is common to feel sleepy, have impaired judgment, or feel nauseous if you eat too soon. Rest after you get home. Know the things you should not do after the procedure. Follow the diet recommended by your health care provider and drink enough fluid to keep your urine pale yellow. Get help right away if you have trouble breathing or new-onset confusion at home. This information is not intended to replace advice given to you by your health care provider. Make sure you discuss any questions you have with your health care provider. Document Revised: 05/10/2019 Document Reviewed:  12/06/2018 Elsevier Patient Education  [place].

## 2021-11-03 NOTE — H&P (Signed)
Referring Physician(s): Feng,Y  Supervising Physician: Corrie Mckusick  Patient Status:  [facility] OP  Chief Complaint:  Anemia, thrombocytopenia  Subjective: Patient known to IR service from bone marrow biopsy on 10/25/2021.  She is a 66 y.o. female with PMH sig for left breast cancer, s/p mastectomy in 2019 along with chemoradiation, DM, HLD, HTN, anxiety/depression who recently presented to [facility] with severe symptomatic anemia and thrombocytopenia.  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  Repeat sampling was recommended including fresh core biopsies for flow cytometric and molecular studies.  The patient presents today for repeat CT-guided bone marrow biopsy.  She currently denies fever, headache, chest pain, dyspnea, cough, abdominal/back pain, nausea, vomiting or bleeding.  Past Medical History:  Diagnosis Date   Cancer ([place]) 03/2017   left breast cancer   Diabetes mellitus without complication ([place])    Hyperlipidemia    Hypertension    Malignant neoplasm of left breast ([place])    Stroke (cerebrum) ([place])    Tibial plateau fracture, left    04-13-17 had ORIF   Past Surgical History:  Procedure Laterality Date   MASTECTOMY Left 2019   MASTECTOMY MODIFIED RADICAL Left 11/20/2017   Procedure: LEFT MODIFIED RADICAL MASTECTOMY;  Surgeon: Fanny Skates, MD;  Location: [facility];  Service: General;  Laterality: Left;   ORIF TIBIA PLATEAU Left 04/13/2017   Procedure: OPEN REDUCTION INTERNAL FIXATION (ORIF) TIBIAL PLATEAU;  Surgeon: Shona Needles, MD;  Location: [facility];  Service: Orthopedics;  Laterality: Left;   PORT-A-CATH REMOVAL Right 11/20/2017   Procedure: REMOVAL PORT-A-CATH;  Surgeon: Fanny Skates, MD;  Location: [facility];   Service: General;  Laterality: Right;   PORTACATH PLACEMENT Right 05/31/2017   Procedure: INSERTION PORT-A-CATH;  Surgeon: Fanny Skates, MD;  Location: [facility];  Service: General;  Laterality: Right;      Allergies: Victoza [liraglutide]  Medications: Prior to Admission medications   Medication Sig Start Date End Date Taking? Authorizing Provider  Accu-Chek Softclix Lancets lancets Use to check blood sugar 3 times daily. E11.65 10/28/21  Yes McClung, Angela M, PA-C  atorvastatin (LIPITOR) 40 MG tablet Take 1 tablet (40 mg total) by mouth daily. 10/05/21  Yes [name], NP  Blood Glucose Monitoring Suppl (ACCU-CHEK GUIDE) w/Device KIT use kit to check blood glucose three times daily 02/15/21  Yes [name], NP  Blood Pressure Monitor DEVI Please provide patient with insurance approved blood pressure monitor 01/14/19  Yes [name], NP  citalopram (CELEXA) 20 MG tablet Take 1 tablet (20 mg total) by mouth daily. For depression 10/05/21  Yes [name], NP  fenofibrate (TRICOR) 145 MG tablet Take 1 tablet (145 mg total) by mouth daily. 10/05/21  Yes [name], NP  glimepiride (AMARYL) 4 MG tablet Take 1 tablet (4 mg total) by mouth in the morning and at bedtime. 10/05/21  Yes [name], NP  glucose blood (ACCU-CHEK GUIDE) test strip Use to check blood sugar 3 times daily. E11.65 10/28/21  Yes McClung, Dionne Bucy, PA-C  hydrOXYzine (ATARAX) 10 MG tablet Take 1 tablet (10 mg total) by mouth 3 (three) times daily as needed for anxiety. 10/05/21  Yes [name], NP  insulin glargine (LANTUS) 100 UNIT/ML Solostar Pen Inject 25 Units into the skin  at bedtime. 10/26/21  Yes Eugenie Filler, MD  insulin lispro (HUMALOG) 100 UNIT/ML injection Inject 0.04 mLs (4 Units total) into the skin 3 (three) times daily before meals. 02/17/21  Yes [name], NP  Insulin Pen Needle 31G X 5 MM MISC USE AS INSTRUCTED. INJECT INTO THE SKIN ONCE NIGHTLY.  02/15/21 02/15/22 Yes [name], NP  Insulin Syringe-Needle U-100 30G X 5/16" 1 ML MISC Use as directed to inject into the skin 3 times daily. 02/17/21  Yes [name], NP  losartan (COZAAR) 50 MG tablet Take 1 tablet (50 mg total) by mouth daily. 10/05/21  Yes [name], NP  pantoprazole (PROTONIX) 40 MG tablet Take 1 tablet (40 mg total) by mouth daily at 6 (six) AM. 10/27/21  Yes Eugenie Filler, MD  potassium chloride SA (KLOR-CON M) 20 MEQ tablet Take 1 tablet (20 mEq total) by mouth daily. 10/05/21  Yes [name], NP  sitaGLIPtin-metformin (JANUMET) 50-1000 MG tablet TAKE 1 TABLET BY MOUTH 2 TIMES DAILY 10/05/21  Yes [name], NP     Vital Signs: Blood pressure 132/62, temp 99.2, heart rate 80, respirations 16, O2 sat 95% room air  Ht 5' (1.524 m)   Wt 114 lb 10.2 oz (52 kg)   BMI 22.39 kg/m   Physical Exam: Awake, alert.  Chest clear to auscultation bilaterally.  Heart with regular rate and rhythm.  Abdomen soft, positive bowel sounds, nontender.  No lower extremity edema.  Imaging: No results found.  Labs:  CBC: Recent Labs    10/24/21 0734 10/25/21 0536 10/26/21 0548 10/29/21 0822  WBC 3.4* 3.4* 3.7* 2.9*  HGB 8.9* 8.6* 8.9* 8.5*  HCT 26.1* 25.6* 26.2* 25.1*  PLT 30* 29* 30* 42*    COAGS: Recent Labs    10/22/21 0037  INR 1.3*  APTT 26    BMP: Recent Labs    10/24/21 0734 10/25/21 0536 10/26/21 0548 10/29/21 0822  NA 131* 134* 136 137  K 3.8 3.5 3.8 3.9  CL 100 102 104 102  CO2 _0 GLUCOSE 301* 277* 128* 240*  BUN _1 CALCIUM 8.5* 8.7* 9.0 9.0  CREATININE 0.51 0.60 0.59 0.58  GFRNONAA >60 >60 >60 >60    LIVER FUNCTION TESTS: Recent Labs    10/05/21 0925 10/21/21 1240 10/22/21 0037 10/29/21 0822  BILITOT 0.5 0.8 0.6 0.5  AST 15 14* 17 13*  ALT _2 ALKPHOS 133* 116 84 91  PROT 7.8 8.5* 7.8 8.4*  ALBUMIN 3.9 3.3* 3.0* 3.5    Assessment and Plan: Patient known to IR service from  bone marrow biopsy on 10/25/2021.  She is a 66 y.o. female with PMH sig for left breast cancer, s/p mastectomy in 2019 along with chemoradiation, DM, HLD, HTN, anxiety/depression who recently presented to [facility] with severe symptomatic anemia and thrombocytopenia.  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  Repeat sampling was recommended including fresh core biopsies for flow cytometric and molecular studies.  The patient presents today for repeat CT-guided bone marrow biopsy. Risks and benefits of procedure was discussed with the patient via interpreter including, but not limited to bleeding, infection, damage to adjacent structures or low yield requiring additional tests.  All  of the questions were answered and there is agreement to proceed.  Consent signed and in chart.    Electronically Signed: D. Rowe Robert, PA-C 11/03/2021, 8:09 AM   I spent a total of 15 Minutes at the the patient's bedside AND on the patient's hospital floor or unit, greater than 50% of which was counseling/coordinating care for CT-guided bone marrow biopsy

## 2021-11-03 NOTE — Progress Notes (Signed)
Patient ID: Amanda Duarte, female   DOB: February 17, 1955, 66 y.o.   MRN: [id number] Pt's hgb today is 7.6; notified [name] who is arranging for pt to undergo blood transfusion tomorrow at noon at [facility]. Pt informed. Type and screen ordered.

## 2021-11-03 NOTE — Progress Notes (Signed)
Type and screen drawn and sent to lab. Patient's blood bank bracelet placed and she is aware to leave it in place until she arrives for her blood transfusion tomorrow at the [facility]. Spanish Interpreter - in person used for entirety of interaction. Patient verbalized understanding.

## 2021-11-04 ENCOUNTER — Inpatient Hospital Stay: Payer: Medicare Other

## 2021-11-04 DIAGNOSIS — C50212 Malignant neoplasm of upper-inner quadrant of left female breast: Secondary | ICD-10-CM

## 2021-11-04 DIAGNOSIS — D649 Anemia, unspecified: Secondary | ICD-10-CM

## 2021-11-04 DIAGNOSIS — D469 Myelodysplastic syndrome, unspecified: Secondary | ICD-10-CM | POA: Diagnosis not present

## 2021-11-04 MED ORDER — SODIUM CHLORIDE 0.9% IV SOLUTION: 250.0000 mL | Freq: Once | INTRAVENOUS | Status: DC

## 2021-11-04 MED ORDER — ACETAMINOPHEN 325 MG PO TABS
650.0000 mg | ORAL_TABLET | Freq: Once | ORAL | Status: AC
  Administered 2021-11-04: 650 mg via ORAL
  Filled 2021-11-04: qty 2

## 2021-11-04 MED ORDER — DIPHENHYDRAMINE HCL 25 MG PO CAPS
25.0000 mg | ORAL_CAPSULE | Freq: Once | ORAL | Status: AC
  Administered 2021-11-04: 25 mg via ORAL
  Filled 2021-11-04: qty 1

## 2021-11-04 NOTE — Patient Instructions (Signed)

## 2021-11-05 ENCOUNTER — Ambulatory Visit: Payer: Medicare Other | Admitting: Pharmacist

## 2021-11-05 LAB — TYPE AND SCREEN
ABO/RH(D): O POS
Antibody Screen: NEGATIVE
Unit division: 0
Unit division: 0

## 2021-11-05 LAB — BPAM RBC
Blood Product Expiration Date: 202311092359
Blood Product Expiration Date: 202311092359
ISSUE DATE / TIME: 202310121216
ISSUE DATE / TIME: 202310121216
Unit Type and Rh: 5100
Unit Type and Rh: 5100

## 2021-11-08 ENCOUNTER — Ambulatory Visit (HOSPITAL_COMMUNITY): Payer: Medicare Other

## 2021-11-08 NOTE — Progress Notes (Addendum)
Pharmacist Chemotherapy Monitoring - Initial Assessment    Anticipated start date: 11/15/21   The following has been reviewed per standard work regarding the patient's treatment regimen: The patient's diagnosis, treatment plan and drug doses, and organ/hematologic function Lab orders and baseline tests specific to treatment regimen  The treatment plan start date, drug sequencing, and pre-medications Prior authorization status  Patient's documented medication list, including drug-drug interaction screen and prescriptions for anti-emetics and supportive care specific to the treatment regimen The drug concentrations, fluid compatibility, administration routes, and timing of the medications to be used The patient's access for treatment and lifetime cumulative dose history, if applicable  The patient's medication allergies and previous infusion related reactions, if applicable   Changes made to treatment plan:  N/A  Follow up needed:  1) for home antiemetics   Acquanetta Belling, RPH, BCPS, BCOP 11/08/2021  4:09 PM

## 2021-11-09 LAB — SURGICAL PATHOLOGY

## 2021-11-09 NOTE — Progress Notes (Signed)
Pharmacist Chemotherapy Monitoring - Initial Assessment    Anticipated start date: 11/16/2021   The following has been reviewed per standard work regarding the patient's treatment regimen: The patient's diagnosis, treatment plan and drug doses, and organ/hematologic function Lab orders and baseline tests specific to treatment regimen  The treatment plan start date, drug sequencing, and pre-medications Prior authorization status  Patient's documented medication list, including drug-drug interaction screen and prescriptions for anti-emetics and supportive care specific to the treatment regimen The drug concentrations, fluid compatibility, administration routes, and timing of the medications to be used The patient's access for treatment and lifetime cumulative dose history, if applicable  The patient's medication allergies and previous infusion related reactions, if applicable   Changes made to treatment plan:  N/A  Follow up needed:  [place], [facility], 11/09/2021  9:47 AM

## 2021-11-09 NOTE — Progress Notes (Unsigned)
[place]   [phone] Fax:([phone]) [phone]   Clinic Follow up Note   Patient Care Team: [name], NP as PCP - General (Nurse Practitioner) [name], MD as Consulting Physician (Hematology) Fanny Skates, MD as Consulting Physician (General Surgery) Alla Feeling, NP as Nurse Practitioner (Nurse Practitioner) Gery Pray, MD as Consulting Physician (Radiation Oncology) 11/10/2021  CHIEF COMPLAINT: Follow up bone marrow biopsy   SUMMARY OF ONCOLOGIC HISTORY: Oncology History Overview Note  Cancer Staging Malignant neoplasm of upper-inner quadrant of left breast in female, estrogen receptor positive ([place]) Staging form: Breast, AJCC 8th Edition - Clinical stage from 05/01/2017: Stage Unknown (cTX, cN2, cM0, G3, ER-, PR-, HER2-) - Signed by [name], MD on 11/01/2017 - Pathologic stage from 11/20/2017: No Stage Recommended (ypT0, pN68m, cM0, GX, ER-, PR-, HER2-) - Signed by [name] on 12/10/2017     Malignant neoplasm of upper-inner quadrant of left breast in female, estrogen receptor positive ([place]  04/28/2017 Mammogram   IMPRESSION: Two adjacent masses/enlarged lymph nodes in the LOWER LEFT axilla, the largest measuring 2.1 cm. Tissue sampling of 1 of these is recommended to exclude malignancy/lymphoma. No mammographic evidence of breast malignancy bilaterally.    05/01/2017 Initial Biopsy   Diagnosis 05/01/17 Lymph node, needle/core biopsy, low left inferior axillary - METASTATIC POORLY DIFFERENTIATED CARCINOMA TO A LYMPH NODE. SEE NOTE.   05/01/2017 Cancer Staging   Staging form: Breast, AJCC 8th Edition - Clinical stage from 05/01/2017: Stage Unknown (cTX, cN1, cM0, G3, ER-, PR-, HER2-) - Signed by [name] on 06/02/2017    05/01/2017 Receptors her2   Lymph Node Biopsy:  HER2-Negative  PR-Negative  ER- Negative    05/10/2017 Imaging   MR Breast W WO Contrast 05/10/17 IMPRESSION: No MRI evidence of malignancy in the right  breast. Area of week stippled non mass enhancement in the left breast upper inner quadrant. Separate area of thin linear non mass enhancement in the subareolar left breast. Three grossly abnormal left axillary lymph nodes, and more than 4 less than 1 cm indeterminate left axillary lymph nodes. No evidence of right axillary lymphadenopathy.   05/17/2017 Initial Biopsy   Diagnosis 05/17/17 1. 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 2. Breast, left, needle core biopsy, upper inner post MR enhancement - MICROSCOPIC FOCUS OF DUCTAL CARCINOMA IN SITU.   05/17/2017 Receptors her2   Left Breast Biopsy:  ER-Negative PR-Negative HER2-Negative   05/22/2017 Initial Diagnosis   Malignant neoplasm of upper-inner quadrant of left breast in female, estrogen receptor positive ([place]   06/01/2017 Imaging   [place] Scan 06/01/17 IMPRESSION: No definite scintigraphic evidence of osseous metastatic disease.   Posttraumatic and postsurgical uptake at the LEFT knee.   Nonspecific soft tissue distribution of tracer at the LEFT thigh, could represent contusion, hemorrhage, soft tissue edema, or soft tissue calcifications such as from heterotopic calcification and myositis ossificans; recommend clinical correlation and consider dedicated LEFT femoral radiographs.   Single focus of nonspecific increased tracer localization at the lateral LEFT orbit.     06/01/2017 Imaging   06/01/2017 Bone Scan IMPRESSION: No definite scintigraphic evidence of osseous metastatic disease.   Posttraumatic and postsurgical uptake at the LEFT knee.   Nonspecific soft tissue distribution of tracer at the LEFT thigh, could represent contusion, hemorrhage, soft tissue edema, or soft tissue calcifications such as from heterotopic calcification and myositis ossificans; recommend clinical correlation and consider dedicated LEFT  femoral  radiographs.   Single focus of nonspecific increased tracer localization at the lateral LEFT orbit.   06/09/2017 -  Chemotherapy   ddAC every 2 weeks for 4 weeks starting 06/09/17-07/21/17 followed by weekly [place] and taxol for 12 weeks 08/04/17-10/20/17.    10/24/2017 Imaging   Breast MRI B/l 10/24/17 IMPRESSION: 1. No residual enhancement in the LEFT breast following neoadjuvant treatment. 2. Significantly smaller LEFT axillary lymph nodes, largest now measuring 1.4 centimeters. The remainder of LEFT axillary lymph nodes demonstrate normal fatty hila. RECOMMENDATION: Treatment plan for known LEFT breast cancer.   11/20/2017 Cancer Staging   Staging form: Breast, AJCC 8th Edition - Pathologic stage from 11/20/2017: No Stage Recommended (ypT0, pN85m, cM0, GX, ER-, PR-, HER2-) - Signed by [name] on 12/10/2017   11/20/2017 Surgery   Left mastectomy and SLN biopsy by Dr. IDalbert Batman   11/20/2017 Pathology Results   Breast, modified radical mastectomy , left - MICROSCOPIC FOCI OF RESIDUAL METASTATIC CARCINOMA INVOLVING TWO OF EIGHT LYMPH NODES (2/8); LARGEST CONTINUOUS FOCUS MEASURES 0.1 CM. - NO EVIDENCE OF RESIDUAL CARCINOMA IN THE MASTECTOMY SPECIMEN - MARKED THERAPY-RELATED CHANGES, INCLUDING DENSE HYALIN FIBROSIS     11/20/2017 Receptors her2   ER- PR- HER2- (I[facility] 1+)   01/02/2018 - 02/14/2018 Radiation Therapy   Adjuvant Radiation 01/02/18 - 02/14/18   07/31/2018 Imaging   Baseline DEXA 07/31/18  ASSESSMENT: The BMD measured at AP Spine L1-L4 is 0.973 g/cm2 with a T-score of -1.7.   This patient is considered OSTEOPENIC according to [place] criteria. The scan quality is good.   Site Region Measured Date Measured Age YA T-score BMD Significant CHANGE   AP Spine  L1-L4      07/31/2018    63.1         -1.7    0.973 g/cm2   DualFemur Neck Left  07/31/2018    63.1         -1.5    0.828 g/cm2   DualFemur Total Mean 07/31/2018    63.1         0.1      1.016 g/cm2   11/05/2018 Survivorship   Per LCira Rue NP    11/15/2021 -  Chemotherapy   Patient is on Treatment Plan : MYELODYSPLASIA  Azacitidine SQ D1-7 q28d       CURRENT THERAPY: PENDING azacitidine days 5+2 q. 28 days to start 10/23  INTERVAL HISTORY: Ms. SShounreturns with Spanish speaking interpreter and her daughter for follow up as scheduled, to review bone marrow biopsy.  She is doing well overall with adequate energy.  Daughter reports she is not eating as much.  Bowels moving.  Feels well in general.  Denies fever, chills, cough, chest pain, dyspnea, nausea/vomiting, bruising or bleeding, or any pain.  All other systems were reviewed with the patient and are negative.  MEDICAL HISTORY:  Past Medical History:  Diagnosis Date   Cancer ([place] 03/2017   left breast cancer   Diabetes mellitus without complication ([place]    Hyperlipidemia    Hypertension    Malignant neoplasm of left breast ([place]    [place] (cerebrum) ([place]    Tibial plateau fracture, left    04-13-17 had ORIF    SURGICAL HISTORY: Past Surgical History:  Procedure Laterality Date   MASTECTOMY Left 2019   MASTECTOMY MODIFIED RADICAL Left 11/20/2017   Procedure: LEFT MODIFIED RADICAL MASTECTOMY;  Surgeon: IFanny Skates MD;  [place]  Service: General;  Laterality:  Left;   ORIF TIBIA PLATEAU Left 04/13/2017   Procedure: OPEN REDUCTION INTERNAL FIXATION (ORIF) TIBIAL PLATEAU;  Surgeon: Shona Needles, MD;  Location: [facility];  Service: Orthopedics;  Laterality: Left;   PORT-A-CATH REMOVAL Right 11/20/2017   Procedure: REMOVAL PORT-A-CATH;  Surgeon: Fanny Skates, MD;  Location: [place];  Service: General;  Laterality: Right;   PORTACATH PLACEMENT Right 05/31/2017   Procedure: INSERTION PORT-A-CATH;  Surgeon: Fanny Skates, MD;  Location: [place];  Service: General;  Laterality: Right;    I have reviewed the social history and family history with the patient and they are  unchanged from previous note.  ALLERGIES:  is allergic to victoza [liraglutide].  MEDICATIONS:  Current Outpatient Medications  Medication Sig Dispense Refill   Accu-Chek Softclix Lancets lancets Use to check blood sugar 3 times daily. E11.65 100 each 3   atorvastatin (LIPITOR) 40 MG tablet Take 1 tablet (40 mg total) by mouth daily. 90 tablet 2   Blood Glucose Monitoring Suppl (ACCU-CHEK GUIDE) w/Device KIT use kit to check blood glucose three times daily 1 kit 0   Blood Pressure Monitor DEVI Please provide patient with insurance approved blood pressure monitor 1 each 0   citalopram (CELEXA) 20 MG tablet Take 1 tablet (20 mg total) by mouth daily. For depression 90 tablet 3   fenofibrate (TRICOR) 145 MG tablet Take 1 tablet (145 mg total) by mouth daily. 90 tablet 2   glimepiride (AMARYL) 4 MG tablet Take 1 tablet (4 mg total) by mouth in the morning and at bedtime. 180 tablet 1   glucose blood (ACCU-CHEK GUIDE) test strip Use to check blood sugar 3 times daily. E11.65 100 each 11   hydrOXYzine (ATARAX) 10 MG tablet Take 1 tablet (10 mg total) by mouth 3 (three) times daily as needed for anxiety. 60 tablet 3   insulin glargine (LANTUS) 100 UNIT/ML Solostar Pen Inject 25 Units into the skin at bedtime. 36 mL 1   insulin lispro (HUMALOG) 100 UNIT/ML injection Inject 0.04 mLs (4 Units total) into the skin 3 (three) times daily before meals. 10 mL 11   Insulin Pen Needle 31G X 5 MM MISC USE AS INSTRUCTED. INJECT INTO THE SKIN ONCE NIGHTLY. 100 each 6   Insulin Syringe-Needle U-100 30G X 5/16" 1 ML MISC Use as directed to inject into the skin 3 times daily. 200 each 6   losartan (COZAAR) 50 MG tablet Take 1 tablet (50 mg total) by mouth daily. 90 tablet 1   ondansetron (ZOFRAN) 8 MG tablet Take 1 tablet (8 mg total) by mouth every 8 (eight) hours as needed for nausea or vomiting. 30 tablet 1   pantoprazole (PROTONIX) 40 MG tablet Take 1 tablet (40 mg total) by mouth daily at 6 (six) AM. 30 tablet 1    potassium chloride SA (KLOR-CON M) 20 MEQ tablet Take 1 tablet (20 mEq total) by mouth daily. 30 tablet 3   prochlorperazine (COMPAZINE) 10 MG tablet Take 1 tablet (10 mg total) by mouth every 6 (six) hours as needed for nausea or vomiting. 30 tablet 1   sitaGLIPtin-metformin (JANUMET) 50-1000 MG tablet TAKE 1 TABLET BY MOUTH 2 TIMES DAILY 180 tablet 1   No current facility-administered medications for this visit.    PHYSICAL EXAMINATION: ECOG PERFORMANCE STATUS: 1 - Symptomatic but completely ambulatory  Vitals:   11/10/21 0934  BP: (!) 116/56  Pulse: 72  Resp: 18  Temp: 98.2 F (36.8 C)  SpO2: 100%   Filed  Weights   11/10/21 0934  Weight: 113 lb 8 oz (51.5 kg)    GENERAL:alert, no distress and comfortable SKIN: no rash or ecchymosis  EYES:  sclera clear LUNGS: clear with normal breathing effort HEART: regular rate & rhythm, no lower extremity edema ABDOMEN:abdomen soft, non-tender and normal bowel sounds  NEURO: alert & oriented x 3 with fluent speech, no focal motor/sensory deficits   LABORATORY DATA:  I have reviewed the data as listed    Latest Ref Rng & Units 11/10/2021    9:08 AM 11/03/2021    8:02 AM 10/29/2021    8:22 AM  CBC  WBC 4.0 - 10.5 K/uL 3.4  4.0  2.9   Hemoglobin 12.0 - 15.0 g/dL 9.2  7.6  8.5   Hematocrit 36.0 - 46.0 % 27.1  22.7  25.1   Platelets 150 - 400 K/uL 23  52  42         Latest Ref Rng & Units 11/10/2021    9:08 AM 10/29/2021    8:22 AM 10/26/2021    5:48 AM  CMP  Glucose 70 - 99 mg/dL 153  240  128   BUN 8 - 23 mg/dL _0 Creatinine 0.44 - 1.00 mg/dL 0.54  0.58  0.59   Sodium 135 - 145 mmol/L 133  137  136   Potassium 3.5 - 5.1 mmol/L 3.9  3.9  3.8   Chloride 98 - 111 mmol/L 100  102  104   CO2 22 - 32 mmol/L _1 Calcium 8.9 - 10.3 mg/dL 9.1  9.0  9.0   Total Protein 6.5 - 8.1 g/dL 8.4  8.4    Total Bilirubin 0.3 - 1.2 mg/dL 0.5  0.5    Alkaline Phos 38 - 126 U/L 75  91    AST 15 - 41 U/L 12  13    ALT 0  - 44 U/L 6  6        RADIOGRAPHIC STUDIES: I have personally reviewed the radiological images as listed and agreed with the findings in the report. No results found.   ASSESSMENT & PLAN: Amanda Duarte is a 66 y.o. female with    1. 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 She also has moderate to severe neutropenia -CT CAP showed no evidence for metastatic disease. -bone marrow biopsy on 10/25/21 showed, within limited material: hypercellular bone marrow with features of myeloid neoplasm. Flow cytometry showed 5% myeloblastic cells, no monoclonal B-cell, nonspecific T cell changes. Repeat biopsy was recommended. -She underwent repeat bone marrow biopsy on 10/11, I discussed results with pathologist Dr. Donneta Romberg and consulted with heme specialist Dr. Lorenso Courier; I reviewed the results with the pt and her family today which again shows high grade myeloid neoplasm/MDS with 7% blasts. No evidence of acute leukemia but she understands the risk of transformation -[name] previously discussed the treatment for MDS which is chemo consisting of Azacitabine given for 5+2 days (total 7 days weekends off) every 4 weeks. We reviewed the logistics, goal, potential side effects, and symptom management. She agrees to proceed  -will monitor labs closely, transfuse irradiated products as needed  -[name] plans to refer to [facility] for bone marrow transplant if she does not respond quickly to treatment -plan to start 10/23 and will see  [name] on day 1   2. 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 She had excellent response. -most recent right mammogram on 07/26/21 and right axilla [place]  10/27/21 were benign.   3. Pancytopenia -secondary to her MDS -We will give blood transfusion if hemoglobin less than 8, and platelet transfusion if platelet less than 20 K -No G-CSF due to the excessive blasts in the marrow -We will give leukoreduced and irradiated blood products in the future given the possibility she may need a bone marrow transplant.    PLAN: -Bone marrow biopsy reviewed, consistent with high-grade MDS/myeloid neoplasm -Proceed with cycle 1 day 1 azacitidine for 5 days + 2 days of the next week (for a total of 7 days), every 28 days starting 10/23 -Follow-up cycle 1 day 1 -Home meds sent to pharmacy    All questions were answered. The patient knows to call the clinic with any problems, questions or concerns. No barriers to learning was detected. I spent 25 minutes counseling the patient face to face. The total time spent in the appointment was 40 minutes and more than 50% was on counseling and review of test results     LAlla Feeling NP 11/10/21

## 2021-11-10 ENCOUNTER — Inpatient Hospital Stay (HOSPITAL_BASED_OUTPATIENT_CLINIC_OR_DEPARTMENT_OTHER): Payer: Medicare Other | Admitting: Nurse Practitioner

## 2021-11-10 ENCOUNTER — Encounter: Payer: Self-pay | Admitting: Nurse Practitioner

## 2021-11-10 ENCOUNTER — Other Ambulatory Visit: Payer: Self-pay

## 2021-11-10 ENCOUNTER — Encounter (HOSPITAL_COMMUNITY): Payer: Self-pay | Admitting: Hematology

## 2021-11-10 ENCOUNTER — Inpatient Hospital Stay: Payer: Medicare Other

## 2021-11-10 VITALS — BP 116/56 | HR 72 | Temp 98.2°F | Resp 18 | Ht 60.0 in | Wt 113.5 lb

## 2021-11-10 DIAGNOSIS — C50212 Malignant neoplasm of upper-inner quadrant of left female breast: Secondary | ICD-10-CM | POA: Diagnosis not present

## 2021-11-10 DIAGNOSIS — Z170 Estrogen receptor positive status [ER+]: Secondary | ICD-10-CM | POA: Diagnosis not present

## 2021-11-10 DIAGNOSIS — D469 Myelodysplastic syndrome, unspecified: Secondary | ICD-10-CM | POA: Diagnosis not present

## 2021-11-10 LAB — CBC WITH DIFFERENTIAL (CANCER CENTER ONLY)
Abs Immature Granulocytes: 0.2 10*3/uL — ABNORMAL HIGH (ref 0.00–0.07)
Basophils Absolute: 0 10*3/uL (ref 0.0–0.1)
Basophils Relative: 1 %
Eosinophils Absolute: 0 10*3/uL (ref 0.0–0.5)
Eosinophils Relative: 1 %
HCT: 27.1 % — ABNORMAL LOW (ref 36.0–46.0)
Hemoglobin: 9.2 g/dL — ABNORMAL LOW (ref 12.0–15.0)
Immature Granulocytes: 6 %
Lymphocytes Relative: 52 %
Lymphs Abs: 1.8 10*3/uL (ref 0.7–4.0)
MCH: 31.1 pg (ref 26.0–34.0)
MCHC: 33.9 g/dL (ref 30.0–36.0)
MCV: 91.6 fL (ref 80.0–100.0)
Monocytes Absolute: 0.3 10*3/uL (ref 0.1–1.0)
Monocytes Relative: 9 %
Neutro Abs: 1 10*3/uL — ABNORMAL LOW (ref 1.7–7.7)
Neutrophils Relative %: 31 %
Platelet Count: 23 10*3/uL — ABNORMAL LOW (ref 150–400)
RBC: 2.96 MIL/uL — ABNORMAL LOW (ref 3.87–5.11)
RDW: 16.6 % — ABNORMAL HIGH (ref 11.5–15.5)
WBC Count: 3.4 10*3/uL — ABNORMAL LOW (ref 4.0–10.5)
nRBC: 0 % (ref 0.0–0.2)

## 2021-11-10 LAB — CMP (CANCER CENTER ONLY)
ALT: 6 U/L (ref 0–44)
AST: 12 U/L — ABNORMAL LOW (ref 15–41)
Albumin: 3.4 g/dL — ABNORMAL LOW (ref 3.5–5.0)
Alkaline Phosphatase: 75 U/L (ref 38–126)
Anion gap: 8 (ref 5–15)
BUN: 12 mg/dL (ref 8–23)
CO2: 25 mmol/L (ref 22–32)
Calcium: 9.1 mg/dL (ref 8.9–10.3)
Chloride: 100 mmol/L (ref 98–111)
Creatinine: 0.54 mg/dL (ref 0.44–1.00)
GFR, Estimated: >60 mL/min (ref >60)
Glucose, Bld: 153 mg/dL — ABNORMAL HIGH (ref 70–99)
Potassium: 3.9 mmol/L (ref 3.5–5.1)
Sodium: 133 mmol/L — ABNORMAL LOW (ref 135–145)
Total Bilirubin: 0.5 mg/dL (ref 0.3–1.2)
Total Protein: 8.4 g/dL — ABNORMAL HIGH (ref 6.5–8.1)

## 2021-11-10 MED ORDER — ONDANSETRON HCL 8 MG PO TABS: 8.0000 mg | ORAL_TABLET | Freq: Three times a day (TID) | ORAL | 1 refills | Status: DC | PRN

## 2021-11-10 MED ORDER — PROCHLORPERAZINE MALEATE 10 MG PO TABS: 10.0000 mg | ORAL_TABLET | Freq: Four times a day (QID) | ORAL | 1 refills | Status: DC | PRN

## 2021-11-11 ENCOUNTER — Ambulatory Visit: Payer: Self-pay

## 2021-11-11 NOTE — Patient Outreach (Signed)
  Care Coordination   11/11/2021 Name: Amanda Duarte MRN: [id number] DOB: 1955-08-01   Care Coordination Outreach Attempts:  An unsuccessful telephone outreach was attempted for a scheduled appointment today.  Follow Up Plan:  Additional outreach attempts will be made to offer the patient care coordination information and services.   Encounter Outcome:  Pt. Request to Call Back  Care Coordination Interventions Activated:  No   Care Coordination Interventions:  No, not indicated    Barb Merino, RN, BSN, CCM Care Management Coordinator [place] [facility] Direct Phone: [phone]

## 2021-11-15 ENCOUNTER — Inpatient Hospital Stay (HOSPITAL_BASED_OUTPATIENT_CLINIC_OR_DEPARTMENT_OTHER): Payer: Medicare Other | Admitting: Hematology

## 2021-11-15 ENCOUNTER — Encounter: Payer: Self-pay | Admitting: Hematology

## 2021-11-15 ENCOUNTER — Telehealth: Payer: Self-pay | Admitting: *Deleted

## 2021-11-15 ENCOUNTER — Inpatient Hospital Stay: Payer: Medicare Other

## 2021-11-15 ENCOUNTER — Other Ambulatory Visit: Payer: Self-pay

## 2021-11-15 VITALS — BP 136/58 | HR 73 | Temp 98.1°F | Resp 18 | Wt 113.5 lb

## 2021-11-15 DIAGNOSIS — Z170 Estrogen receptor positive status [ER+]: Secondary | ICD-10-CM

## 2021-11-15 DIAGNOSIS — C50212 Malignant neoplasm of upper-inner quadrant of left female breast: Secondary | ICD-10-CM

## 2021-11-15 DIAGNOSIS — D469 Myelodysplastic syndrome, unspecified: Secondary | ICD-10-CM | POA: Diagnosis not present

## 2021-11-15 LAB — CBC WITH DIFFERENTIAL (CANCER CENTER ONLY)
Abs Immature Granulocytes: 0.25 10*3/uL — ABNORMAL HIGH (ref 0.00–0.07)
Basophils Absolute: 0 10*3/uL (ref 0.0–0.1)
Basophils Relative: 1 %
Eosinophils Absolute: 0 10*3/uL (ref 0.0–0.5)
Eosinophils Relative: 1 %
HCT: 25 % — ABNORMAL LOW (ref 36.0–46.0)
Hemoglobin: 8.4 g/dL — ABNORMAL LOW (ref 12.0–15.0)
Immature Granulocytes: 7 %
Lymphocytes Relative: 59 %
Lymphs Abs: 2.2 10*3/uL (ref 0.7–4.0)
MCH: 31 pg (ref 26.0–34.0)
MCHC: 33.6 g/dL (ref 30.0–36.0)
MCV: 92.3 fL (ref 80.0–100.0)
Monocytes Absolute: 0.3 10*3/uL (ref 0.1–1.0)
Monocytes Relative: 9 %
Neutro Abs: 0.9 10*3/uL — ABNORMAL LOW (ref 1.7–7.7)
Neutrophils Relative %: 23 %
Platelet Count: 49 10*3/uL — ABNORMAL LOW (ref 150–400)
RBC: 2.71 MIL/uL — ABNORMAL LOW (ref 3.87–5.11)
RDW: 16.6 % — ABNORMAL HIGH (ref 11.5–15.5)
WBC Count: 3.7 10*3/uL — ABNORMAL LOW (ref 4.0–10.5)
nRBC: 0 % (ref 0.0–0.2)

## 2021-11-15 LAB — BASIC METABOLIC PANEL - CANCER CENTER ONLY
Anion gap: 6 (ref 5–15)
BUN: 15 mg/dL (ref 8–23)
CO2: 26 mmol/L (ref 22–32)
Calcium: 9.1 mg/dL (ref 8.9–10.3)
Chloride: 100 mmol/L (ref 98–111)
Creatinine: 0.75 mg/dL (ref 0.44–1.00)
GFR, Estimated: >60 mL/min (ref >60)
Glucose, Bld: 206 mg/dL — ABNORMAL HIGH (ref 70–99)
Potassium: 3.9 mmol/L (ref 3.5–5.1)
Sodium: 132 mmol/L — ABNORMAL LOW (ref 135–145)

## 2021-11-15 MED ORDER — ONDANSETRON HCL 8 MG PO TABS
8.0000 mg | ORAL_TABLET | Freq: Once | ORAL | Status: AC
  Administered 2021-11-15: 8 mg via ORAL
  Filled 2021-11-15: qty 1

## 2021-11-15 MED ORDER — AZACITIDINE CHEMO SQ INJECTION
75.0000 mg/m2 | Freq: Once | INTRAMUSCULAR | Status: AC
  Administered 2021-11-15: 112.5 mg via SUBCUTANEOUS
  Filled 2021-11-15: qty 4.5

## 2021-11-15 NOTE — Patient Instructions (Signed)
Instrucciones al darle de alta: Discharge Instructions Gracias por elegir al [facility] de Cncer de [place] para brindarle atencin mdica de oncologa y Music therapist.   Si usted tiene una cita de laboratorio con [place], por favor vaya directamente [place] y regstrese en el rea de Control and instrumentation engineer.   Use ropa cmoda y [place] para tener fcil acceso a las vas del Portacath (acceso venoso de Engineer, site duracin) o la lnea PICC (catter central colocado por va perifrica).   Nos esforzamos por ofrecerle tiempo de calidad con su proveedor. Es posible que tenga que volver a programar su cita si llega tarde (15 minutos o ms).  El llegar tarde le afecta a usted y a otros pacientes cuyas citas son posteriores a Merchandiser, retail.  Adems, si usted falta a tres o ms citas sin avisar a la oficina, puede ser retirado(a) de la clnica a discrecin del proveedor.      Para las solicitudes de renovacin de recetas, pida a su farmacia que se ponga en contacto con nuestra oficina y deje que transcurran 36 horas para que se complete el proceso de las renovaciones.    Hoy usted recibi los siguientes agentes de quimioterapia e/o inmunoterapia Azacitadine (Vidaza)      Para ayudar a prevenir las nuseas y los vmitos despus de su tratamiento, le recomendamos que tome su medicamento para las nuseas segn las indicaciones.  LOS SNTOMAS QUE DEBEN COMUNICARSE INMEDIATAMENTE SE INDICAN A CONTINUACIN: *FIEBRE SUPERIOR A 100.4 F (38 C) O MS *ESCALOFROS O SUDORACIN *NUSEAS Y VMITOS QUE NO SE CONTROLAN CON EL MEDICAMENTO PARA LAS NUSEAS *DIFICULTAD INUSUAL PARA RESPIRAR  *MORETONES O HEMORRAGIAS NO HABITUALES *PROBLEMAS URINARIOS (dolor o ardor al Garment/textile technologist o frecuencia para Garment/textile technologist) *PROBLEMAS INTESTINALES (diarrea inusual, estreimiento, dolor cerca del ano) SENSIBILIDAD EN LA BOCA Y EN LA GARGANTA CON O SIN LA PRESENCIA DE LCERAS (dolor de garganta, llagas en la boca o dolor de  muelas/dientes) ERUPCIN, HINCHAZN O DOLORES INUSUALES FLUJO VAGINAL INUSUAL O PICAZN/RASQUIA    Los puntos marcados con un asterisco ( *) indican una posible emergencia y debe hacer un seguimiento tan pronto como le sea posible o vaya al Departamento de Emergencias si se le presenta algn problema.  Por favor, muestre la [place] DE ADVERTENCIA DE Windy Canny DE ADVERTENCIA DE Benay Spice al registrarse en [address] de Emergencias y a la enfermera de triaje.  Si tiene preguntas despus de su visita o necesita cancelar o volver a programar su cita, por favor pngase en contacto con [place]  Dept: [phone] y [place]. Las horas de oficina son de 8:00 a.m. a 4:30 p.m. de lunes a viernes. Por favor, tenga en cuenta que los mensajes de voz que se dejan despus de las 4:00 p.m. posiblemente no se devolvern hasta el siguiente da de [place].  Cerramos los fines de semana y [employer]. En todo momento tiene acceso a una enfermera para preguntas urgentes. Por favor, llame al nmero principal de la clnica Dept: [phone] y [place] instrucciones.   Para cualquier pregunta que no sea de carcter urgente, tambin puede ponerse en contacto con su proveedor [employer] Ahora ofrecemos visitas electrnicas para cualquier persona mayor de 18 aos que solicite atencin mdica en lnea para los sntomas que no sean urgentes. Para ms detalles vaya a [email or web address].   Tambin puede bajar la aplicacin de MyChart! Vaya a la tienda de aplicaciones, busque "MyChart", abra la aplicacin,  seleccione [place], e ingrese con su nombre de usuario y la contrasea de Pharmacist, community.  Las mscaras son opcionales en los centros de Hotel manager. Si desea que su equipo de cuidados mdicos use una [employer] atienden, por favor hgaselo saber al personal. Bethann Berkshire una persona de apoyo que tenga por lo menos 16 aos  para que le acompae a sus citas. Azacitidine Injection Qu es este medicamento? La AZACITIDINA es un agente quimioteraputico. Este medicamento reduce el crecimiento de clulas cancerosas y puede suprimir el sistema inmunolgico. Se utiliza tambin para el tratamiento de sndrome mielodisplsticos o algunos tipos de leucemia. Este medicamento puede ser utilizado para otros usos; si tiene alguna pregunta consulte con su proveedor de atencin mdica o con su farmacutico. MARCAS COMUNES: Vidaza Qu le debo informar a mi profesional de la salud antes de tomar este medicamento? Necesitan saber si usted presenta alguno de los siguientes problemas o situaciones: enfermedad renal enfermedad heptica tumores hepticos una reaccin alrgica o inusual a la azacitidina, al manitol, a otros medicamentos, alimentos, colorantes o conservantes si est embarazada o buscando quedar embarazada si est amamantando a un beb Cmo debo utilizar este medicamento? Este medicamento se administra mediante inyeccin por va subcutnea. Lo administra un profesional de la salud calificado en un hospital o en un entorno clnico. Hable con su pediatra para informarse acerca del uso de este medicamento en nios. Puede requerir atencin especial. Sobredosis: Pngase en contacto inmediatamente con un centro toxicolgico o una sala de urgencia si usted cree que haya tomado demasiado medicamento. ATENCIN: [employer] es solo para usted. No comparta este medicamento con nadie. Qu sucede si me olvido de una dosis? Es importante no olvidar ninguna dosis. Informe a su mdico o a su profesional de la salud si no puede asistir a Photographer. Qu puede interactuar con este medicamento? No se han estudiado las interacciones. Suministre a Conservation officer, historic buildings de atencin mdica una lista de todos los Arabi, hierbas, medicamentos de [place], o suplementos dietticos que [place]. Dgales tambin si fuma, bebe alcohol, o  utiliza drogas ilegales. Algunos elementos pueden interactuar con su medicamento. Puede ser que esta lista no menciona todas las posibles interacciones. Informe a su profesional de [employer]	[place] de [employer] productos a base de hierbas, medicamentos de [place] o suplementos nutritivos que est tomando. Si usted fuma, consume bebidas alcohlicas o si utiliza drogas ilegales, indqueselo tambin a su profesional de [employer]	[place]. Algunas sustancias pueden interactuar con su medicamento. A qu debo estar atento al usar Coca[employer]Cola? Visite a su mdico para que revise su evolucin. Este medicamento podra hacerle sentir un Nurse, mental health. Esto es normal ya que la quimioterapia puede Print production planner tanto a las clulas sanas como a las clulas cancerosas. Si presenta algn efecto secundario, infrmelo. Contine con el tratamiento aun si se siente enfermo, a menos que su mdico le indique que lo suspenda. En algunos casos, podra recibir [employer] para ayudarlo con los efectos secundarios. Siga todas las instrucciones para usarlos. Consulte a su mdico o a su profesional de la salud si tiene fiebre, escalofros o dolor de garganta, o cualquier otro sntoma de resfro o gripe. No se trate usted mismo. Este medicamento reduce la capacidad del cuerpo para combatir infecciones. Trate de no acercarse a personas que estn enfermas. Este medicamento podra aumentar el riesgo de moretones o sangrado. Consulte a su mdico o a su profesional de la salud si observa sangrados inusuales. Usted podra necesitar realizarse anlisis de sangre mientras est [place]  este medicamento. No debe quedar embarazada mientras est tomando este medicamento y por 6 meses despus de la ltima dosis. Las mujeres deben informar a su mdico si estn buscando quedar embarazadas o si creen que podran estar embarazadas. Los hombres no deben tener hijos mientras estn recibiendo Coca[employer]Cola y durante 3 meses despus de la ltima dosis.  Existe la posibilidad de efectos secundarios graves en un beb sin nacer. Para obtener ms informacin, hable con su profesional de la salud o su farmacutico. No debe amamantar a un beb mientras est usando este medicamento y por 1 semana despus de la ltima dosis. Este medicamento puede interferir con la capacidad de tener hijos. Hable con su mdico o su profesional de la salud si est preocupado por su fertilidad. Qu efectos secundarios puedo tener al [employer] este medicamento? Efectos secundarios que debe informar a su mdico o a Barrister's clerk de la salud tan pronto como sea posible: Chief of Staff, como erupcin cutnea, comezn/picazn o urticarias, e hinchazn de la cara, los labios o la lengua recuentos sanguneos bajos: este medicamento podra reducir la cantidad de glbulos blancos, glbulos rojos y plaquetas. Su riesgo de infeccin y sangrado podra ser mayor. signos de infeccin: fiebre o escalofros, tos, dolor de garganta, dolor al orinar signos de disminucin en la cantidad de plaquetas o sangrado: moretones, puntos rojos en la piel, heces de color negro y aspecto alquitranado, sangre en la orina signos de disminucin en la cantidad de glbulos rojos: cansancio o debilidad inusual, Youth worker, aturdimiento signos y sntomas de lesin al rin, tales como dificultad para orinar o cambios en la cantidad de orina signos y sntomas de lesin al hgado, como orina amarilla oscura o [place]; sensacin general de estar enfermo o sntomas gripales; heces claras; prdida de apetito; nuseas; dolor en la regin abdominal superior derecha; cansancio o debilidad inusual; color amarillento de los ojos o la piel Efectos secundarios que generalmente no requieren atencin mdica (infrmelos a su mdico o a su profesional de la salud si persisten o si son molestos): estreimiento diarrea nuseas, vmito dolor o enrojecimiento en TEFL teacher de la inyeccin cansancio o debilidad inusual Puede ser  que esta lista no menciona todos los posibles efectos secundarios. Comunquese a su mdico por asesoramiento mdico [employer]. Usted puede informar los efectos secundarios a la FDA por telfono al 1-800-FDA-1088. Dnde debo guardar mi medicina? Este medicamento se administra en hospitales o clnicas y no necesitar guardarlo en su domicilio. ATENCIN: Este folleto es un resumen. Puede ser que no cubra toda la posible informacin. Si usted tiene preguntas acerca de esta medicina, consulte con su mdico, su farmacutico o su profesional de Technical sales engineer.  2023 Elsevier/Gold Standard (2016-03-24 00:00:00)

## 2021-11-15 NOTE — Progress Notes (Addendum)
[place]   [phone] Fax:([phone]) [phone]   Clinic Follow up Note   Patient Care Team: [name], NP as PCP - General (Nurse Practitioner) [name], MD as Consulting Physician (Hematology) Fanny Skates, MD as Consulting Physician (General Surgery) Alla Feeling, NP as Nurse Practitioner (Nurse Practitioner) Gery Pray, MD as Consulting Physician (Radiation Oncology)  Date of Service:  11/15/2021  CHIEF COMPLAINT: f/u of MDS, h/o breast cancer  CURRENT THERAPY:  Azacitadine injection, days 1-5, 8-9 q28d, starting 11/15/21  ASSESSMENT & PLAN:  Amanda Duarte is a 66 y.o. female with   1. 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 -CT CAP showed no evidence for metastatic disease. -bone marrow biopsy on 10/25/21 showed, within limited material: hypercellular bone marrow with features of myeloid neoplasm. Flow cytometry showed 5% myeloblastic cells, no monoclonal B-cell, nonspecific T cell changes.  -repeat BMB on 11/03/21 again showed high grade myeloid neoplasm/MDS with 7% blasts.  -I discussed her cytogenetics and FISH test results, which showed 3 abnormal type. She has very high risk disease based on the IPSS-R score, and indicating very poor prognosis, the median overall survival 0.8 months, is at a very high risk for acute leukemia transformation. -She is scheduled to start azacitadine injections today, 11/15/21, to be given for 5+2 days (daily but weekends off) every 4 weeks. We will monitor her labs and plan to repeat BMB in ~3 months to see how she is responding. Will refer to [facility] for bone marrow transplant evaluation.  -labs reviewed, hgb 8.4, plt 49k, ANC 0.9. Adequate to proceed with azacitadine injection today.   2. Pancytopenia -secondary to her MDS -We will give blood  transfusion if hemoglobin less than 8, and platelet transfusion if platelet less than 20 K -No G-CSF due to the excessive blasts in the marrow -We will give leukoreduced and irradiated blood products in the future given the possibility she may need a bone marrow transplant.  3. 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 She had excellent response. -most recent right mammogram on 07/26/21 and right axilla [place]on 10/27/21 were benign.    PLAN: -proceed with Azacitadine injection today and daily through 10/31 (weekends off) -lab weekly and f/u in 2 weeks -lab, f/u, and C2 Azacitadine in 4 weeks -will order NGS on her bone marrow sample    No problem-specific Assessment & Plan notes found for this encounter.   SUMMARY OF ONCOLOGIC HISTORY: Oncology History Overview Note  Cancer Staging Malignant neoplasm of upper-inner quadrant of left breast in female, estrogen receptor positive ([place] Staging form: Breast, AJCC 8th Edition - Clinical stage from 05/01/2017: Stage Unknown (cTX, cN2, cM0, G3, ER-, PR-, HER2-) - Signed by [name] on 11/01/2017 - Pathologic stage from 11/20/2017: No Stage Recommended (ypT0, pN172m cM0, GX, ER-, PR-, HER2-) - Signed by FeTruitt MerleMD on 12/10/2017     Malignant neoplasm of upper-inner quadrant of left breast in female, estrogen receptor positive ([place] 04/28/2017 Mammogram   IMPRESSION: Two adjacent masses/enlarged lymph nodes in the LOWER LEFT axilla, the largest measuring 2.1 cm. Tissue sampling of 1 of these is recommended to exclude malignancy/lymphoma. No mammographic evidence of breast malignancy bilaterally.  05/01/2017 Initial Biopsy   Diagnosis 05/01/17 Lymph node, needle/core biopsy, low left inferior axillary - METASTATIC POORLY DIFFERENTIATED CARCINOMA TO A LYMPH  NODE. SEE NOTE.   05/01/2017 Cancer Staging   Staging form: Breast, AJCC 8th Edition - Clinical stage from 05/01/2017: Stage Unknown (cTX, cN1, cM0, G3, ER-, PR-, HER2-) - Signed by [name], MD on 06/02/2017    05/01/2017 Receptors her2   Lymph Node Biopsy:  HER2-Negative  PR-Negative  ER- Negative    05/10/2017 Imaging   MR Breast W WO Contrast 05/10/17 IMPRESSION: No MRI evidence of malignancy in the right breast. Area of week stippled non mass enhancement in the left breast upper inner quadrant. Separate area of thin linear non mass enhancement in the subareolar left breast. Three grossly abnormal left axillary lymph nodes, and more than 4 less than 1 cm indeterminate left axillary lymph nodes. No evidence of right axillary lymphadenopathy.   05/17/2017 Initial Biopsy   Diagnosis 05/17/17 1. 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 2. Breast, left, needle core biopsy, upper inner post MR enhancement - MICROSCOPIC FOCUS OF DUCTAL CARCINOMA IN SITU.   05/17/2017 Receptors her2   Left Breast Biopsy:  ER-Negative PR-Negative HER2-Negative   05/22/2017 Initial Diagnosis   Malignant neoplasm of upper-inner quadrant of left breast in female, estrogen receptor positive ([place])   06/01/2017 Imaging   [place] Scan 06/01/17 IMPRESSION: No definite scintigraphic evidence of osseous metastatic disease.   Posttraumatic and postsurgical uptake at the LEFT knee.   Nonspecific soft tissue distribution of tracer at the LEFT thigh, could represent contusion, hemorrhage, soft tissue edema, or soft tissue calcifications such as from heterotopic calcification and myositis ossificans; recommend clinical correlation and consider dedicated LEFT femoral radiographs.   Single focus of nonspecific increased tracer localization at the lateral LEFT orbit.     06/01/2017 Imaging   06/01/2017 Bone Scan IMPRESSION: No  definite scintigraphic evidence of osseous metastatic disease.   Posttraumatic and postsurgical uptake at the LEFT knee.   Nonspecific soft tissue distribution of tracer at the LEFT thigh, could represent contusion, hemorrhage, soft tissue edema, or soft tissue calcifications such as from heterotopic calcification and myositis ossificans; recommend clinical correlation and consider dedicated LEFT femoral radiographs.   Single focus of nonspecific increased tracer localization at the lateral LEFT orbit.   06/09/2017 -  Chemotherapy   ddAC every 2 weeks for 4 weeks starting 06/09/17-07/21/17 followed by weekly [place] and taxol for 12 weeks 08/04/17-10/20/17.    10/24/2017 Imaging   Breast MRI B/l 10/24/17 IMPRESSION: 1. No residual enhancement in the LEFT breast following neoadjuvant treatment. 2. Significantly smaller LEFT axillary lymph nodes, largest now measuring 1.4 centimeters. The remainder of LEFT axillary lymph nodes demonstrate normal fatty hila. RECOMMENDATION: Treatment plan for known LEFT breast cancer.   11/20/2017 Cancer Staging   Staging form: Breast, AJCC 8th Edition - Pathologic stage from 11/20/2017: No Stage Recommended (ypT0, pN22m, cM0, GX, ER-, PR-, HER2-) - Signed by [name] on 12/10/2017   11/20/2017 Surgery   Left mastectomy and SLN biopsy by Dr. IDalbert Batman   11/20/2017 Pathology Results   Breast, modified radical mastectomy , left - MICROSCOPIC FOCI OF RESIDUAL METASTATIC CARCINOMA INVOLVING TWO OF EIGHT LYMPH NODES (2/8); LARGEST CONTINUOUS FOCUS MEASURES 0.1 CM. - NO EVIDENCE OF RESIDUAL CARCINOMA IN THE MASTECTOMY SPECIMEN - MARKED THERAPY-RELATED CHANGES, INCLUDING DENSE HYALIN FIBROSIS     11/20/2017 Receptors her2   ER-  PR- HER2- (IHC 1+)   01/02/2018 - 02/14/2018 Radiation Therapy   Adjuvant Radiation 01/02/18 - 02/14/18   07/31/2018 Imaging   Baseline DEXA 07/31/18  ASSESSMENT: The BMD measured at AP Spine L1-L4 is 0.973 g/cm2 with a T-score  of -1.7.   This patient is considered OSTEOPENIC according to [facility]) criteria. The scan quality is good.   Site Region Measured Date Measured Age YA T-score BMD Significant CHANGE   AP Spine  L1-L4      07/31/2018    63.1         -1.7    0.973 g/cm2   DualFemur Neck Left  07/31/2018    63.1         -1.5    0.828 g/cm2   DualFemur Total Mean 07/31/2018    63.1         0.1     1.016 g/cm2   11/05/2018 Survivorship   Per [name], NP    11/15/2021 -  Chemotherapy   Patient is on Treatment Plan : MYELODYSPLASIA  Azacitidine SQ D1-7 q28d        INTERVAL HISTORY:  Amanda Duarte is here for a follow up of MDS. She was last seen by NP Lacie on 11/10/21. She was seen in the infusion area. Interpreter Almyra Free was present. She reports she is doing well, no new concerns. She notes she has recovered well from BMB.   All other systems were reviewed with the patient and are negative.  MEDICAL HISTORY:  Past Medical History:  Diagnosis Date   Cancer ([place]) 03/2017   left breast cancer   Diabetes mellitus without complication ([place])    Hyperlipidemia    Hypertension    Malignant neoplasm of left breast ([place])    Stroke (cerebrum) ([place])    Tibial plateau fracture, left    04-13-17 had ORIF    SURGICAL HISTORY: Past Surgical History:  Procedure Laterality Date   MASTECTOMY Left 2019   MASTECTOMY MODIFIED RADICAL Left 11/20/2017   Procedure: LEFT MODIFIED RADICAL MASTECTOMY;  Surgeon: Fanny Skates, MD;  Location: [place];  Service: General;  Laterality: Left;   ORIF TIBIA PLATEAU Left 04/13/2017   Procedure: OPEN REDUCTION INTERNAL FIXATION (ORIF) TIBIAL PLATEAU;  Surgeon: Shona Needles, MD;  Location: [place] [facility];  Service: Orthopedics;  Laterality: Left;   PORT-A-CATH REMOVAL Right 11/20/2017   Procedure: REMOVAL PORT-A-CATH;  Surgeon: Fanny Skates, MD;  Location: [place];  Service: General;  Laterality: Right;   PORTACATH PLACEMENT Right 05/31/2017   Procedure:  INSERTION PORT-A-CATH;  Surgeon: Fanny Skates, MD;  Location: [place];  Service: General;  Laterality: Right;    I have reviewed the social history and family history with the patient and they are unchanged from previous note.  ALLERGIES:  is allergic to victoza [liraglutide].  MEDICATIONS:  Current Outpatient Medications  Medication Sig Dispense Refill   Accu-Chek Softclix Lancets lancets Use to check blood sugar 3 times daily. E11.65 100 each 3   atorvastatin (LIPITOR) 40 MG tablet Take 1 tablet (40 mg total) by mouth daily. 90 tablet 2   Blood Glucose Monitoring Suppl (ACCU-CHEK GUIDE) w/Device KIT use kit to check blood glucose three times daily 1 kit 0   Blood Pressure Monitor DEVI Please provide patient with insurance approved blood pressure monitor 1 each 0   citalopram (CELEXA) 20 MG tablet Take 1 tablet (20 mg total) by mouth daily. For depression 90 tablet 3   fenofibrate (TRICOR) 145  MG tablet Take 1 tablet (145 mg total) by mouth daily. 90 tablet 2   glimepiride (AMARYL) 4 MG tablet Take 1 tablet (4 mg total) by mouth in the morning and at bedtime. 180 tablet 1   glucose blood (ACCU-CHEK GUIDE) test strip Use to check blood sugar 3 times daily. E11.65 100 each 11   hydrOXYzine (ATARAX) 10 MG tablet Take 1 tablet (10 mg total) by mouth 3 (three) times daily as needed for anxiety. 60 tablet 3   insulin glargine (LANTUS) 100 UNIT/ML Solostar Pen Inject 25 Units into the skin at bedtime. 36 mL 1   insulin lispro (HUMALOG) 100 UNIT/ML injection Inject 0.04 mLs (4 Units total) into the skin 3 (three) times daily before meals. 10 mL 11   Insulin Pen Needle 31G X 5 MM MISC USE AS INSTRUCTED. INJECT INTO THE SKIN ONCE NIGHTLY. 100 each 6   Insulin Syringe-Needle U-100 30G X 5/16" 1 ML MISC Use as directed to inject into the skin 3 times daily. 200 each 6   losartan (COZAAR) 50 MG tablet Take 1 tablet (50 mg total) by mouth daily. 90 tablet 1   ondansetron (ZOFRAN) 8 MG  tablet Take 1 tablet (8 mg total) by mouth every 8 (eight) hours as needed for nausea or vomiting. 30 tablet 1   pantoprazole (PROTONIX) 40 MG tablet Take 1 tablet (40 mg total) by mouth daily at 6 (six) AM. 30 tablet 1   potassium chloride SA (KLOR-CON M) 20 MEQ tablet Take 1 tablet (20 mEq total) by mouth daily. 30 tablet 3   prochlorperazine (COMPAZINE) 10 MG tablet Take 1 tablet (10 mg total) by mouth every 6 (six) hours as needed for nausea or vomiting. 30 tablet 1   sitaGLIPtin-metformin (JANUMET) 50-1000 MG tablet TAKE 1 TABLET BY MOUTH 2 TIMES DAILY 180 tablet 1   No current facility-administered medications for this visit.    PHYSICAL EXAMINATION: ECOG PERFORMANCE STATUS: 1 - Symptomatic but completely ambulatory  There were no vitals filed for this visit. Wt Readings from Last 3 Encounters:  11/15/21 113 lb 8 oz (51.5 kg)  11/10/21 113 lb 8 oz (51.5 kg)  11/03/21 114 lb 10.2 oz (52 kg)     GENERAL:alert, no distress and comfortable SKIN: skin color normal, no rashes or significant lesions EYES: normal, Conjunctiva are pink and non-injected, sclera clear  NEURO: alert & oriented x 3 with fluent speech  LABORATORY DATA:  I have reviewed the data as listed    Latest Ref Rng & Units 11/15/2021   11:43 AM 11/10/2021    9:08 AM 11/03/2021    8:02 AM  CBC  WBC 4.0 - 10.5 K/uL 3.7  3.4  4.0   Hemoglobin 12.0 - 15.0 g/dL 8.4  9.2  7.6   Hematocrit 36.0 - 46.0 % 25.0  27.1  22.7   Platelets 150 - 400 K/uL 49  23  52         Latest Ref Rng & Units 11/15/2021   11:43 AM 11/10/2021    9:08 AM 10/29/2021    8:22 AM  CMP  Glucose 70 - 99 mg/dL 206  153  240   BUN 8 - 23 mg/dL 15  12  17    Creatinine 0.44 - 1.00 mg/dL 0.75  0.54  0.58   Sodium 135 - 145 mmol/L 132  133  137   Potassium 3.5 - 5.1 mmol/L 3.9  3.9  3.9   Chloride 98 - 111 mmol/L 100  100  102   CO2 22 - 32 mmol/L 26  25  28    Calcium 8.9 - 10.3 mg/dL 9.1  9.1  9.0   Total Protein 6.5 - 8.1 g/dL  8.4  8.4    Total Bilirubin 0.3 - 1.2 mg/dL  0.5  0.5   Alkaline Phos 38 - 126 U/L  75  91   AST 15 - 41 U/L  12  13   ALT 0 - 44 U/L  6  6       RADIOGRAPHIC STUDIES: I have personally reviewed the radiological images as listed and agreed with the findings in the report. No results found.    No orders of the defined types were placed in this encounter.  All questions were answered. The patient knows to call the clinic with any problems, questions or concerns. No barriers to learning was detected. The total time spent in the appointment was 30 minutes.     [name], MD 11/15/2021   I, Wilburn Mylar, am acting as scribe for [name], MD.   I have reviewed the above documentation for accuracy and completeness, and I agree with the above.

## 2021-11-15 NOTE — Chronic Care Management (AMB) (Signed)
  Care Coordination  Outreach Note  11/15/2021 Name: Amanda Duarte MRN: [id number] DOB: 17-Oct-1955   Care Coordination Outreach Attempts: An unsuccessful telephone outreach was attempted today to offer the patient information about available care coordination services as a benefit of their health plan.   Follow Up Plan:  Additional outreach attempts will be made to offer the patient care coordination information and services.   Encounter Outcome:  No Answer  [place]  Direct Dial: [phone]

## 2021-11-15 NOTE — Progress Notes (Signed)
Per [name]- ok to treat today with an ANC of 0.9 and platelets of 49.

## 2021-11-16 ENCOUNTER — Other Ambulatory Visit: Payer: Self-pay

## 2021-11-16 ENCOUNTER — Inpatient Hospital Stay: Payer: Medicare Other

## 2021-11-16 VITALS — BP 121/47 | HR 84 | Temp 99.0°F | Resp 20

## 2021-11-16 DIAGNOSIS — D649 Anemia, unspecified: Secondary | ICD-10-CM

## 2021-11-16 DIAGNOSIS — D61818 Other pancytopenia: Secondary | ICD-10-CM

## 2021-11-16 DIAGNOSIS — C50212 Malignant neoplasm of upper-inner quadrant of left female breast: Secondary | ICD-10-CM

## 2021-11-16 DIAGNOSIS — D469 Myelodysplastic syndrome, unspecified: Secondary | ICD-10-CM | POA: Diagnosis not present

## 2021-11-16 MED ORDER — AZACITIDINE CHEMO SQ INJECTION
75.0000 mg/m2 | Freq: Once | INTRAMUSCULAR | Status: AC
  Administered 2021-11-16: 112.5 mg via SUBCUTANEOUS
  Filled 2021-11-16: qty 4.5

## 2021-11-16 MED ORDER — ONDANSETRON HCL 8 MG PO TABS
8.0000 mg | ORAL_TABLET | Freq: Once | ORAL | Status: AC
  Administered 2021-11-16: 8 mg via ORAL
  Filled 2021-11-16: qty 1

## 2021-11-16 NOTE — Progress Notes (Signed)
Per verbal order from [name], please place referral order for Dr. Doristine Devoid Medical Oncology and Hematology at [facility] [phone] and [phone]).  New patient referral Team for Dr. Linus Orn were faxed the referral order, Dr. Ernestina Penna last office note, pt demographics, and notified that the pt's most recent bone marrow biopsy results is still pending and will be faxed once they have resulted.  Epic faxed and traditionally faxed information.  Fax confirmations received.

## 2021-11-16 NOTE — Patient Instructions (Signed)
Instrucciones al darle de alta: Discharge Instructions Gracias por elegir al [facility] para brindarle atencin mdica de oncologa y hematologa.   Si usted tiene una cita de laboratorio con el [facility], por favor vaya directamente al [facility] y regstrese en el rea de registro.   Use ropa cmoda y adecuada para tener fcil acceso a las vas del Portacath (acceso venoso de larga duracin) o la lnea PICC (catter central colocado por va perifrica).   Nos esforzamos por ofrecerle tiempo de calidad con su proveedor. Es posible que tenga que volver a programar su cita si llega tarde (15 minutos o ms).  El llegar tarde le afecta a usted y a otros pacientes cuyas citas son posteriores a la suya.  Adems, si usted falta a tres o ms citas sin avisar a la oficina, puede ser retirado(a) de la clnica a discrecin del proveedor.      Para las solicitudes de renovacin de recetas, pida a su farmacia que se ponga en contacto con nuestra oficina y deje que transcurran 72 horas para que se complete el proceso de las renovaciones.    Hoy usted recibi los siguientes agentes de quimioterapia e/o inmunoterapia: Azacitidine (Vidaza)   Para ayudar a prevenir las nuseas y los vmitos despus de su tratamiento, le recomendamos que tome su medicamento para las nuseas segn las indicaciones.  LOS SNTOMAS QUE DEBEN COMUNICARSE INMEDIATAMENTE SE INDICAN A CONTINUACIN: *FIEBRE SUPERIOR A 100.4 F (38 C) O MS *ESCALOFROS O SUDORACIN *NUSEAS Y VMITOS QUE NO SE CONTROLAN CON EL MEDICAMENTO PARA LAS NUSEAS *DIFICULTAD INUSUAL PARA RESPIRAR  *MORETONES O HEMORRAGIAS NO HABITUALES *PROBLEMAS URINARIOS (dolor o ardor al orinar o frecuencia para orinar) *PROBLEMAS INTESTINALES (diarrea inusual, estreimiento, dolor cerca del ano) SENSIBILIDAD EN LA BOCA Y EN LA GARGANTA CON O SIN LA PRESENCIA DE LCERAS (dolor de garganta, llagas en la boca o dolor de  muelas/dientes) ERUPCIN, HINCHAZN O DOLORES INUSUALES FLUJO VAGINAL INUSUAL O PICAZN/RASQUIA    Los puntos marcados con un asterisco ( *) indican una posible emergencia y debe hacer un seguimiento tan pronto como le sea posible o vaya al Departamento de Emergencias si se le presenta algn problema.  Por favor, muestre la TARJETA DE ADVERTENCIA DE QUIMIOTERAPIA O LA TARJETA DE ADVERTENCIA DE INMUNOTERAPIA al registrarse en el Departamento de Emergencias y a la enfermera de triaje.  Si tiene preguntas despus de su visita o necesita cancelar o volver a programar su cita, por favor pngase en contacto con [facility]  Dept: [phone] y siga las instrucciones. Las horas de oficina son de 8:00 a.m. a 4:30 p.m. de lunes a viernes. Por favor, tenga en cuenta que los mensajes de voz que se dejan despus de las 4:00 p.m. posiblemente no se devolvern hasta el siguiente da de trabajo.  Cerramos los fines de semana y los das festivos importantes. En todo momento tiene acceso a una enfermera para preguntas urgentes. Por favor, llame al nmero principal de la clnica Dept: [phone] y siga las instrucciones.   Para cualquier pregunta que no sea de carcter urgente, tambin puede ponerse en contacto con su proveedor utilizando MyChart. Ahora ofrecemos visitas electrnicas para cualquier persona mayor de 18 aos que solicite atencin mdica en lnea para los sntomas que no sean urgentes. Para ms detalles vaya a [email or web address].   Tambin puede bajar la aplicacin de MyChart! Vaya a la tienda de aplicaciones, busque "MyChart", abra la aplicacin, seleccione [place],   e ingrese con su nombre de usuario y la contrasea de MyChart.  Las mscaras son opcionales en los centros de cncer. Si desea que su equipo de cuidados mdicos use una mscara mientras le atienden, por favor hgaselo saber al personal. Puede tener una persona de apoyo que tenga por lo menos 16 aos  para que le acompae a sus citas. Azacitidine Injection Qu es este medicamento? La AZACITIDINA es un agente quimioteraputico. Este medicamento reduce el crecimiento de clulas cancerosas y puede suprimir el sistema inmunolgico. Se utiliza tambin para el tratamiento de sndrome mielodisplsticos o algunos tipos de leucemia. Este medicamento puede ser utilizado para otros usos; si tiene alguna pregunta consulte con su proveedor de atencin mdica o con su farmacutico. MARCAS COMUNES: Vidaza Qu le debo informar a mi profesional de la salud antes de tomar este medicamento? Necesitan saber si usted presenta alguno de los siguientes problemas o situaciones: enfermedad renal enfermedad heptica tumores hepticos una reaccin alrgica o inusual a la azacitidina, al manitol, a otros medicamentos, alimentos, colorantes o conservantes si est embarazada o buscando quedar embarazada si est amamantando a un beb Cmo debo utilizar este medicamento? Este medicamento se administra mediante inyeccin por va subcutnea. Lo administra un profesional de la salud calificado en un hospital o en un entorno clnico. Hable con su pediatra para informarse acerca del uso de este medicamento en nios. Puede requerir atencin especial. Sobredosis: Pngase en contacto inmediatamente con un centro toxicolgico o una sala de urgencia si usted cree que haya tomado demasiado medicamento. ATENCIN: Este medicamento es solo para usted. No comparta este medicamento con nadie. Qu sucede si me olvido de una dosis? Es importante no olvidar ninguna dosis. Informe a su mdico o a su profesional de la salud si no puede asistir a una cita. Qu puede interactuar con este medicamento? No se han estudiado las interacciones. Suministre a su proveedor de atencin mdica una lista de todos los medicamentos, hierbas, medicamentos de venta libre, o suplementos dietticos que utilice. Dgales tambin si fuma, bebe alcohol, o  utiliza drogas ilegales. Algunos elementos pueden interactuar con su medicamento. Puede ser que esta lista no menciona todas las posibles interacciones. Informe a su profesional de la salud de todos los productos a base de hierbas, medicamentos de venta libre o suplementos nutritivos que est tomando. Si usted fuma, consume bebidas alcohlicas o si utiliza drogas ilegales, indqueselo tambin a su profesional de la salud. Algunas sustancias pueden interactuar con su medicamento. A qu debo estar atento al usar este medicamento? Visite a su mdico para que revise su evolucin. Este medicamento podra hacerle sentir un malestar general. Esto es normal ya que la quimioterapia puede afectar tanto a las clulas sanas como a las clulas cancerosas. Si presenta algn efecto secundario, infrmelo. Contine con el tratamiento aun si se siente enfermo, a menos que su mdico le indique que lo suspenda. En algunos casos, podra recibir medicamentos adicionales para ayudarlo con los efectos secundarios. Siga todas las instrucciones para usarlos. Consulte a su mdico o a su profesional de la salud si tiene fiebre, escalofros o dolor de garganta, o cualquier otro sntoma de resfro o gripe. No se trate usted mismo. Este medicamento reduce la capacidad del cuerpo para combatir infecciones. Trate de no acercarse a personas que estn enfermas. Este medicamento podra aumentar el riesgo de moretones o sangrado. Consulte a su mdico o a su profesional de la salud si observa sangrados inusuales. Usted podra necesitar realizarse anlisis de sangre mientras est usando este medicamento. No   debe quedar embarazada mientras est tomando este medicamento y por 6 meses despus de la ltima dosis. Las mujeres deben informar a su mdico si estn buscando quedar embarazadas o si creen que podran estar embarazadas. Los hombres no deben tener hijos mientras estn recibiendo este medicamento y durante 3 meses despus de la ltima dosis.  Existe la posibilidad de efectos secundarios graves en un beb sin nacer. Para obtener ms informacin, hable con su profesional de la salud o su farmacutico. No debe amamantar a un beb mientras est usando este medicamento y por 1 semana despus de la ltima dosis. Este medicamento puede interferir con la capacidad de tener hijos. Hable con su mdico o su profesional de la salud si est preocupado por su fertilidad. Qu efectos secundarios puedo tener al utilizar este medicamento? Efectos secundarios que debe informar a su mdico o a su profesional de la salud tan pronto como sea posible: reacciones alrgicas, como erupcin cutnea, comezn/picazn o urticarias, e hinchazn de la cara, los labios o la lengua recuentos sanguneos bajos: este medicamento podra reducir la cantidad de glbulos blancos, glbulos rojos y plaquetas. Su riesgo de infeccin y sangrado podra ser mayor. signos de infeccin: fiebre o escalofros, tos, dolor de garganta, dolor al orinar signos de disminucin en la cantidad de plaquetas o sangrado: moretones, puntos rojos en la piel, heces de color negro y aspecto alquitranado, sangre en la orina signos de disminucin en la cantidad de glbulos rojos: cansancio o debilidad inusual, desmayos, aturdimiento signos y sntomas de lesin al rin, tales como dificultad para orinar o cambios en la cantidad de orina signos y sntomas de lesin al hgado, como orina amarilla oscura o marrn; sensacin general de estar enfermo o sntomas gripales; heces claras; prdida de apetito; nuseas; dolor en la regin abdominal superior derecha; cansancio o debilidad inusual; color amarillento de los ojos o la piel Efectos secundarios que generalmente no requieren atencin mdica (infrmelos a su mdico o a su profesional de la salud si persisten o si son molestos): estreimiento diarrea nuseas, vmito dolor o enrojecimiento en el lugar de la inyeccin cansancio o debilidad inusual Puede ser  que esta lista no menciona todos los posibles efectos secundarios. Comunquese a su mdico por asesoramiento mdico sobre los efectos secundarios. Usted puede informar los efectos secundarios a la FDA por telfono al 1-800-FDA-1088. Dnde debo guardar mi medicina? Este medicamento se administra en hospitales o clnicas y no necesitar guardarlo en su domicilio. ATENCIN: Este folleto es un resumen. Puede ser que no cubra toda la posible informacin. Si usted tiene preguntas acerca de esta medicina, consulte con su mdico, su farmacutico o su profesional de la salud.  2023 Elsevier/Gold Standard (2016-03-24 00:00:00) 

## 2021-11-17 ENCOUNTER — Other Ambulatory Visit: Payer: Self-pay

## 2021-11-17 ENCOUNTER — Inpatient Hospital Stay: Payer: Medicare Other

## 2021-11-17 VITALS — BP 117/52 | HR 78 | Temp 99.3°F | Resp 16 | Ht 60.0 in | Wt 114.0 lb

## 2021-11-17 DIAGNOSIS — D469 Myelodysplastic syndrome, unspecified: Secondary | ICD-10-CM | POA: Diagnosis not present

## 2021-11-17 DIAGNOSIS — C50212 Malignant neoplasm of upper-inner quadrant of left female breast: Secondary | ICD-10-CM

## 2021-11-17 MED ORDER — AZACITIDINE CHEMO SQ INJECTION
75.0000 mg/m2 | Freq: Once | INTRAMUSCULAR | Status: AC
  Administered 2021-11-17: 112.5 mg via SUBCUTANEOUS
  Filled 2021-11-17: qty 4.5

## 2021-11-17 MED ORDER — ONDANSETRON HCL 8 MG PO TABS
8.0000 mg | ORAL_TABLET | Freq: Once | ORAL | Status: AC
  Administered 2021-11-17: 8 mg via ORAL
  Filled 2021-11-17: qty 1

## 2021-11-17 NOTE — Patient Instructions (Signed)
Instrucciones al darle de alta: Discharge Instructions Gracias por elegir al [facility] para brindarle atencin mdica de oncologa y hematologa.   Si usted tiene una cita de laboratorio con el [facility], por favor vaya directamente al [facility] y regstrese en el rea de registro.   Use ropa cmoda y adecuada para tener fcil acceso a las vas del Portacath (acceso venoso de larga duracin) o la lnea PICC (catter central colocado por va perifrica).   Nos esforzamos por ofrecerle tiempo de calidad con su proveedor. Es posible que tenga que volver a programar su cita si llega tarde (15 minutos o ms).  El llegar tarde le afecta a usted y a otros pacientes cuyas citas son posteriores a la suya.  Adems, si usted falta a tres o ms citas sin avisar a la oficina, puede ser retirado(a) de la clnica a discrecin del proveedor.      Para las solicitudes de renovacin de recetas, pida a su farmacia que se ponga en contacto con nuestra oficina y deje que transcurran 72 horas para que se complete el proceso de las renovaciones.    Hoy usted recibi los siguientes agentes de quimioterapia e/o inmunoterapia: Azacitidine (Vidaza)   Para ayudar a prevenir las nuseas y los vmitos despus de su tratamiento, le recomendamos que tome su medicamento para las nuseas segn las indicaciones.  LOS SNTOMAS QUE DEBEN COMUNICARSE INMEDIATAMENTE SE INDICAN A CONTINUACIN: *FIEBRE SUPERIOR A 100.4 F (38 C) O MS *ESCALOFROS O SUDORACIN *NUSEAS Y VMITOS QUE NO SE CONTROLAN CON EL MEDICAMENTO PARA LAS NUSEAS *DIFICULTAD INUSUAL PARA RESPIRAR  *MORETONES O HEMORRAGIAS NO HABITUALES *PROBLEMAS URINARIOS (dolor o ardor al orinar o frecuencia para orinar) *PROBLEMAS INTESTINALES (diarrea inusual, estreimiento, dolor cerca del ano) SENSIBILIDAD EN LA BOCA Y EN LA GARGANTA CON O SIN LA PRESENCIA DE LCERAS (dolor de garganta, llagas en la boca o dolor de  muelas/dientes) ERUPCIN, HINCHAZN O DOLORES INUSUALES FLUJO VAGINAL INUSUAL O PICAZN/RASQUIA    Los puntos marcados con un asterisco ( *) indican una posible emergencia y debe hacer un seguimiento tan pronto como le sea posible o vaya al Departamento de Emergencias si se le presenta algn problema.  Por favor, muestre la TARJETA DE ADVERTENCIA DE QUIMIOTERAPIA O LA TARJETA DE ADVERTENCIA DE INMUNOTERAPIA al registrarse en el Departamento de Emergencias y a la enfermera de triaje.  Si tiene preguntas despus de su visita o necesita cancelar o volver a programar su cita, por favor pngase en contacto con [facility]  Dept: [phone] y siga las instrucciones. Las horas de oficina son de 8:00 a.m. a 4:30 p.m. de lunes a viernes. Por favor, tenga en cuenta que los mensajes de voz que se dejan despus de las 4:00 p.m. posiblemente no se devolvern hasta el siguiente da de trabajo.  Cerramos los fines de semana y los das festivos importantes. En todo momento tiene acceso a una enfermera para preguntas urgentes. Por favor, llame al nmero principal de la clnica Dept: [phone] y siga las instrucciones.   Para cualquier pregunta que no sea de carcter urgente, tambin puede ponerse en contacto con su proveedor utilizando MyChart. Ahora ofrecemos visitas electrnicas para cualquier persona mayor de 18 aos que solicite atencin mdica en lnea para los sntomas que no sean urgentes. Para ms detalles vaya a [email or web address].   Tambin puede bajar la aplicacin de MyChart! Vaya a la tienda de aplicaciones, busque "MyChart", abra la aplicacin, seleccione [place],   e ingrese con su nombre de usuario y la contrasea de MyChart.  Las mscaras son opcionales en los centros de cncer. Si desea que su equipo de cuidados mdicos use una mscara mientras le atienden, por favor hgaselo saber al personal. Puede tener una persona de apoyo que tenga por lo menos 16 aos  para que le acompae a sus citas. Azacitidine Injection Qu es este medicamento? La AZACITIDINA es un agente quimioteraputico. Este medicamento reduce el crecimiento de clulas cancerosas y puede suprimir el sistema inmunolgico. Se utiliza tambin para el tratamiento de sndrome mielodisplsticos o algunos tipos de leucemia. Este medicamento puede ser utilizado para otros usos; si tiene alguna pregunta consulte con su proveedor de atencin mdica o con su farmacutico. MARCAS COMUNES: Vidaza Qu le debo informar a mi profesional de la salud antes de tomar este medicamento? Necesitan saber si usted presenta alguno de los siguientes problemas o situaciones: enfermedad renal enfermedad heptica tumores hepticos una reaccin alrgica o inusual a la azacitidina, al manitol, a otros medicamentos, alimentos, colorantes o conservantes si est embarazada o buscando quedar embarazada si est amamantando a un beb Cmo debo utilizar este medicamento? Este medicamento se administra mediante inyeccin por va subcutnea. Lo administra un profesional de la salud calificado en un hospital o en un entorno clnico. Hable con su pediatra para informarse acerca del uso de este medicamento en nios. Puede requerir atencin especial. Sobredosis: Pngase en contacto inmediatamente con un centro toxicolgico o una sala de urgencia si usted cree que haya tomado demasiado medicamento. ATENCIN: Este medicamento es solo para usted. No comparta este medicamento con nadie. Qu sucede si me olvido de una dosis? Es importante no olvidar ninguna dosis. Informe a su mdico o a su profesional de la salud si no puede asistir a una cita. Qu puede interactuar con este medicamento? No se han estudiado las interacciones. Suministre a su proveedor de atencin mdica una lista de todos los medicamentos, hierbas, medicamentos de venta libre, o suplementos dietticos que utilice. Dgales tambin si fuma, bebe alcohol, o  utiliza drogas ilegales. Algunos elementos pueden interactuar con su medicamento. Puede ser que esta lista no menciona todas las posibles interacciones. Informe a su profesional de la salud de todos los productos a base de hierbas, medicamentos de venta libre o suplementos nutritivos que est tomando. Si usted fuma, consume bebidas alcohlicas o si utiliza drogas ilegales, indqueselo tambin a su profesional de la salud. Algunas sustancias pueden interactuar con su medicamento. A qu debo estar atento al usar este medicamento? Visite a su mdico para que revise su evolucin. Este medicamento podra hacerle sentir un malestar general. Esto es normal ya que la quimioterapia puede afectar tanto a las clulas sanas como a las clulas cancerosas. Si presenta algn efecto secundario, infrmelo. Contine con el tratamiento aun si se siente enfermo, a menos que su mdico le indique que lo suspenda. En algunos casos, podra recibir medicamentos adicionales para ayudarlo con los efectos secundarios. Siga todas las instrucciones para usarlos. Consulte a su mdico o a su profesional de la salud si tiene fiebre, escalofros o dolor de garganta, o cualquier otro sntoma de resfro o gripe. No se trate usted mismo. Este medicamento reduce la capacidad del cuerpo para combatir infecciones. Trate de no acercarse a personas que estn enfermas. Este medicamento podra aumentar el riesgo de moretones o sangrado. Consulte a su mdico o a su profesional de la salud si observa sangrados inusuales. Usted podra necesitar realizarse anlisis de sangre mientras est usando este medicamento. No   debe quedar embarazada mientras est tomando este medicamento y por 6 meses despus de la ltima dosis. Las mujeres deben informar a su mdico si estn buscando quedar embarazadas o si creen que podran estar embarazadas. Los hombres no deben tener hijos mientras estn recibiendo este medicamento y durante 3 meses despus de la ltima dosis.  Existe la posibilidad de efectos secundarios graves en un beb sin nacer. Para obtener ms informacin, hable con su profesional de la salud o su farmacutico. No debe amamantar a un beb mientras est usando este medicamento y por 1 semana despus de la ltima dosis. Este medicamento puede interferir con la capacidad de tener hijos. Hable con su mdico o su profesional de la salud si est preocupado por su fertilidad. Qu efectos secundarios puedo tener al utilizar este medicamento? Efectos secundarios que debe informar a su mdico o a su profesional de la salud tan pronto como sea posible: reacciones alrgicas, como erupcin cutnea, comezn/picazn o urticarias, e hinchazn de la cara, los labios o la lengua recuentos sanguneos bajos: este medicamento podra reducir la cantidad de glbulos blancos, glbulos rojos y plaquetas. Su riesgo de infeccin y sangrado podra ser mayor. signos de infeccin: fiebre o escalofros, tos, dolor de garganta, dolor al orinar signos de disminucin en la cantidad de plaquetas o sangrado: moretones, puntos rojos en la piel, heces de color negro y aspecto alquitranado, sangre en la orina signos de disminucin en la cantidad de glbulos rojos: cansancio o debilidad inusual, desmayos, aturdimiento signos y sntomas de lesin al rin, tales como dificultad para orinar o cambios en la cantidad de orina signos y sntomas de lesin al hgado, como orina amarilla oscura o marrn; sensacin general de estar enfermo o sntomas gripales; heces claras; prdida de apetito; nuseas; dolor en la regin abdominal superior derecha; cansancio o debilidad inusual; color amarillento de los ojos o la piel Efectos secundarios que generalmente no requieren atencin mdica (infrmelos a su mdico o a su profesional de la salud si persisten o si son molestos): estreimiento diarrea nuseas, vmito dolor o enrojecimiento en el lugar de la inyeccin cansancio o debilidad inusual Puede ser  que esta lista no menciona todos los posibles efectos secundarios. Comunquese a su mdico por asesoramiento mdico sobre los efectos secundarios. Usted puede informar los efectos secundarios a la FDA por telfono al 1-800-FDA-1088. Dnde debo guardar mi medicina? Este medicamento se administra en hospitales o clnicas y no necesitar guardarlo en su domicilio. ATENCIN: Este folleto es un resumen. Puede ser que no cubra toda la posible informacin. Si usted tiene preguntas acerca de esta medicina, consulte con su mdico, su farmacutico o su profesional de la salud.  2023 Elsevier/[employer] Standard (2016-03-24 00:00:00) 

## 2021-11-18 ENCOUNTER — Inpatient Hospital Stay: Payer: Medicare Other

## 2021-11-18 VITALS — BP 129/53 | HR 83 | Temp 97.5°F | Resp 16

## 2021-11-18 DIAGNOSIS — C50212 Malignant neoplasm of upper-inner quadrant of left female breast: Secondary | ICD-10-CM

## 2021-11-18 DIAGNOSIS — D469 Myelodysplastic syndrome, unspecified: Secondary | ICD-10-CM | POA: Diagnosis not present

## 2021-11-18 MED ORDER — AZACITIDINE CHEMO SQ INJECTION
75.0000 mg/m2 | Freq: Once | INTRAMUSCULAR | Status: AC
  Administered 2021-11-18: 112.5 mg via SUBCUTANEOUS
  Filled 2021-11-18: qty 4.5

## 2021-11-18 MED ORDER — ONDANSETRON HCL 8 MG PO TABS
8.0000 mg | ORAL_TABLET | Freq: Once | ORAL | Status: AC
  Administered 2021-11-18: 8 mg via ORAL
  Filled 2021-11-18: qty 1

## 2021-11-18 NOTE — Patient Instructions (Signed)
Instrucciones al darle de alta: Discharge Instructions Gracias por elegir al [facility] de Cncer de [place] para brindarle atencin mdica de oncologa y Music therapist.   Si usted tiene una cita de laboratorio con [place], por favor vaya directamente [place] y regstrese en el rea de Control and instrumentation engineer.   Use ropa cmoda y [place] para tener fcil acceso a las vas del Portacath (acceso venoso de Engineer, site duracin) o la lnea PICC (catter central colocado por va perifrica).   Nos esforzamos por ofrecerle tiempo de calidad con su proveedor. Es posible que tenga que volver a programar su cita si llega tarde (15 minutos o ms).  El llegar tarde le afecta a usted y a otros pacientes cuyas citas son posteriores a Merchandiser, retail.  Adems, si usted falta a tres o ms citas sin avisar a la oficina, puede ser retirado(a) de la clnica a discrecin del proveedor.      Para las solicitudes de renovacin de recetas, pida a su farmacia que se ponga en contacto con nuestra oficina y deje que transcurran 29 horas para que se complete el proceso de las renovaciones.    Hoy usted recibi los siguientes agentes de quimioterapia e/o inmunoterapia: Azacitidine (Vidaza)   Para ayudar a prevenir las nuseas y los vmitos despus de su tratamiento, le recomendamos que tome su medicamento para las nuseas segn las indicaciones.  LOS SNTOMAS QUE DEBEN COMUNICARSE INMEDIATAMENTE SE INDICAN A CONTINUACIN: *FIEBRE SUPERIOR A 100.4 F (38 C) O MS *ESCALOFROS O SUDORACIN *NUSEAS Y VMITOS QUE NO SE CONTROLAN CON EL MEDICAMENTO PARA LAS NUSEAS *DIFICULTAD INUSUAL PARA RESPIRAR  *MORETONES O HEMORRAGIAS NO HABITUALES *PROBLEMAS URINARIOS (dolor o ardor al Garment/textile technologist o frecuencia para Garment/textile technologist) *PROBLEMAS INTESTINALES (diarrea inusual, estreimiento, dolor cerca del ano) SENSIBILIDAD EN LA BOCA Y EN LA GARGANTA CON O SIN LA PRESENCIA DE LCERAS (dolor de garganta, llagas en la boca o dolor de  muelas/dientes) ERUPCIN, HINCHAZN O DOLORES INUSUALES FLUJO VAGINAL INUSUAL O PICAZN/RASQUIA    Los puntos marcados con un asterisco ( *) indican una posible emergencia y debe hacer un seguimiento tan pronto como le sea posible o vaya al Departamento de Emergencias si se le presenta algn problema.  Por favor, muestre la [place] DE ADVERTENCIA DE Windy Canny DE ADVERTENCIA DE Benay Spice al registrarse en [address] de Emergencias y a la enfermera de triaje.  Si tiene preguntas despus de su visita o necesita cancelar o volver a programar su cita, por favor pngase en contacto con [place]  Dept: [phone] y [place]. Las horas de oficina son de 8:00 a.m. a 4:30 p.m. de lunes a viernes. Por favor, tenga en cuenta que los mensajes de voz que se dejan despus de las 4:00 p.m. posiblemente no se devolvern hasta el siguiente da de [place].  Cerramos los fines de semana y [employer]. En todo momento tiene acceso a una enfermera para preguntas urgentes. Por favor, llame al nmero principal de la clnica Dept: [phone] y [place] instrucciones.   Para cualquier pregunta que no sea de carcter urgente, tambin puede ponerse en contacto con su proveedor [employer]. Ahora ofrecemos visitas electrnicas para cualquier persona mayor de 18 aos que solicite atencin mdica en lnea para los sntomas que no sean urgentes. Para ms detalles vaya a mychart.[email or web address].   Tambin puede bajar la aplicacin de MyChart! Vaya a la tienda de aplicaciones, busque "MyChart", abra la aplicacin, seleccione [employer],  e ingrese con su nombre de usuario y la contrasea de Pharmacist, community.  Las mscaras son opcionales en los centros de Hotel manager. Si desea que su equipo de cuidados mdicos use una [employer] atienden, por favor hgaselo saber al personal. Bethann Berkshire una persona de apoyo que tenga por lo menos 16 aos  para que le acompae a sus citas. Azacitidine Injection Qu es este medicamento? La AZACITIDINA es un agente quimioteraputico. Este medicamento reduce el crecimiento de clulas cancerosas y puede suprimir el sistema inmunolgico. Se utiliza tambin para el tratamiento de sndrome mielodisplsticos o algunos tipos de leucemia. Este medicamento puede ser utilizado para otros usos; si tiene alguna pregunta consulte con su proveedor de atencin mdica o con su farmacutico. MARCAS COMUNES: Vidaza Qu le debo informar a mi profesional de la salud antes de tomar este medicamento? Necesitan saber si usted presenta alguno de los siguientes problemas o situaciones: enfermedad renal enfermedad heptica tumores hepticos una reaccin alrgica o inusual a la azacitidina, al manitol, a otros medicamentos, alimentos, colorantes o conservantes si est embarazada o buscando quedar embarazada si est amamantando a un beb Cmo debo utilizar este medicamento? Este medicamento se administra mediante inyeccin por va subcutnea. Lo administra un profesional de la salud calificado en un hospital o en un entorno clnico. Hable con su pediatra para informarse acerca del uso de este medicamento en nios. Puede requerir atencin especial. Sobredosis: Pngase en contacto inmediatamente con un centro toxicolgico o una sala de urgencia si usted cree que haya tomado demasiado medicamento. ATENCIN: [employer] es solo para usted. No comparta este medicamento con nadie. Qu sucede si me olvido de una dosis? Es importante no olvidar ninguna dosis. Informe a su mdico o a su profesional de la salud si no puede asistir a Photographer. Qu puede interactuar con este medicamento? No se han estudiado las interacciones. Suministre a Conservation officer, historic buildings de atencin mdica una lista de todos los [place], hierbas, medicamentos de [place], o suplementos dietticos que [place]. Dgales tambin si fuma, bebe alcohol, o  utiliza drogas ilegales. Algunos elementos pueden interactuar con su medicamento. Puede ser que esta lista no menciona todas las posibles interacciones. Informe a su profesional de [employer]	[place] de [employer] productos a base de hierbas, medicamentos de [place] o suplementos nutritivos que est tomando. Si usted fuma, consume bebidas alcohlicas o si utiliza drogas ilegales, indqueselo tambin a su profesional de KB Home	[place]. Algunas sustancias pueden interactuar con su medicamento. A qu debo estar atento al usar Coca-Cola? Visite a su mdico para que revise su evolucin. Este medicamento podra hacerle sentir un Nurse, mental health. Esto es normal ya que la quimioterapia puede Print production planner tanto a las clulas sanas como a las clulas cancerosas. Si presenta algn efecto secundario, infrmelo. Contine con el tratamiento aun si se siente enfermo, a menos que su mdico le indique que lo suspenda. En algunos casos, podra recibir [employer] Brands para ayudarlo con los efectos secundarios. Siga todas las instrucciones para usarlos. Consulte a su mdico o a su profesional de la salud si tiene fiebre, escalofros o dolor de garganta, o cualquier otro sntoma de resfro o gripe. No se trate usted mismo. Este medicamento reduce la capacidad del cuerpo para combatir infecciones. Trate de no acercarse a personas que estn enfermas. Este medicamento podra aumentar el riesgo de moretones o sangrado. Consulte a su mdico o a su profesional de la salud si observa sangrados inusuales. Usted podra necesitar realizarse [employer] de sangre mientras est usando [place]. No  debe quedar embarazada mientras est tomando este medicamento y por 6 meses despus de la ltima dosis. Las mujeres deben informar a su mdico si estn buscando quedar embarazadas o si creen que podran estar embarazadas. Los hombres no deben tener hijos mientras estn recibiendo Coca-Cola y durante 3 meses despus de la ltima dosis.  Existe la posibilidad de efectos secundarios graves en un beb sin nacer. Para obtener ms informacin, hable con su profesional de la salud o su farmacutico. No debe amamantar a un beb mientras est usando este medicamento y por 1 semana despus de la ltima dosis. Este medicamento puede interferir con la capacidad de tener hijos. Hable con su mdico o su profesional de la salud si est preocupado por su fertilidad. Qu efectos secundarios puedo tener al [employer] este medicamento? Efectos secundarios que debe informar a su mdico o a Barrister's clerk de la salud tan pronto como sea posible: Chief of Staff, como erupcin cutnea, comezn/picazn o urticarias, e hinchazn de la cara, los labios o la lengua recuentos sanguneos bajos: este medicamento podra reducir la cantidad de glbulos blancos, glbulos rojos y plaquetas. Su riesgo de infeccin y sangrado podra ser mayor. signos de infeccin: fiebre o escalofros, tos, dolor de garganta, dolor al orinar signos de disminucin en la cantidad de plaquetas o sangrado: moretones, puntos rojos en la piel, heces de color negro y aspecto alquitranado, sangre en la orina signos de disminucin en la cantidad de glbulos rojos: cansancio o debilidad inusual, Youth worker, aturdimiento signos y sntomas de lesin al rin, tales como dificultad para orinar o cambios en la cantidad de orina signos y sntomas de lesin al hgado, como orina amarilla oscura o [place]; sensacin general de estar enfermo o sntomas gripales; heces claras; prdida de apetito; nuseas; dolor en la regin abdominal superior derecha; cansancio o debilidad inusual; color amarillento de los ojos o la piel Efectos secundarios que generalmente no requieren atencin mdica (infrmelos a su mdico o a su profesional de la salud si persisten o si son molestos): estreimiento diarrea nuseas, vmito dolor o enrojecimiento en TEFL teacher de la inyeccin cansancio o debilidad inusual Puede ser  que esta lista no menciona todos los posibles efectos secundarios. Comunquese a su mdico por asesoramiento mdico [employer] Inc. Usted puede informar los efectos secundarios a la FDA por telfono al 1-800-FDA-1088. Dnde debo guardar mi medicina? Este medicamento se administra en hospitales o clnicas y no necesitar guardarlo en su domicilio. ATENCIN: Este folleto es un resumen. Puede ser que no cubra toda la posible informacin. Si usted tiene preguntas acerca de esta medicina, consulte con su mdico, su farmacutico o su profesional de Technical sales engineer.  2023 Elsevier/Gold Standard (2016-03-24 00:00:00)

## 2021-11-19 ENCOUNTER — Other Ambulatory Visit: Payer: Medicare Other

## 2021-11-19 ENCOUNTER — Inpatient Hospital Stay: Payer: Medicare Other

## 2021-11-19 ENCOUNTER — Encounter: Payer: Self-pay | Admitting: Hematology

## 2021-11-19 ENCOUNTER — Ambulatory Visit: Payer: Medicare Other | Admitting: Hematology

## 2021-11-19 VITALS — BP 128/53 | HR 80 | Temp 98.6°F | Resp 17

## 2021-11-19 DIAGNOSIS — Z170 Estrogen receptor positive status [ER+]: Secondary | ICD-10-CM

## 2021-11-19 DIAGNOSIS — D469 Myelodysplastic syndrome, unspecified: Secondary | ICD-10-CM | POA: Diagnosis not present

## 2021-11-19 DIAGNOSIS — D46Z Other myelodysplastic syndromes: Secondary | ICD-10-CM | POA: Insufficient documentation

## 2021-11-19 MED ORDER — AZACITIDINE CHEMO SQ INJECTION
75.0000 mg/m2 | Freq: Once | INTRAMUSCULAR | Status: AC
  Administered 2021-11-19: 112.5 mg via SUBCUTANEOUS
  Filled 2021-11-19: qty 4.5

## 2021-11-19 MED ORDER — ONDANSETRON HCL 8 MG PO TABS
8.0000 mg | ORAL_TABLET | Freq: Once | ORAL | Status: AC
  Administered 2021-11-19: 8 mg via ORAL
  Filled 2021-11-19: qty 1

## 2021-11-19 MED ORDER — ACETAMINOPHEN 325 MG PO TABS
650.0000 mg | ORAL_TABLET | Freq: Once | ORAL | Status: AC
  Administered 2021-11-19: 650 mg via ORAL
  Filled 2021-11-19: qty 2

## 2021-11-19 NOTE — Patient Instructions (Signed)
Instrucciones al darle de alta: Discharge Instructions Gracias por elegir al [facility] de Cncer de [place] para brindarle atencin mdica de oncologa y Music therapist.   Si usted tiene una cita de laboratorio con [place], por favor vaya directamente [place] y regstrese en el rea de Control and instrumentation engineer.   Use ropa cmoda y [place] para tener fcil acceso a las vas del Portacath (acceso venoso de Engineer, site duracin) o la lnea PICC (catter central colocado por va perifrica).   Nos esforzamos por ofrecerle tiempo de calidad con su proveedor. Es posible que tenga que volver a programar su cita si llega tarde (15 minutos o ms).  El llegar tarde le afecta a usted y a otros pacientes cuyas citas son posteriores a Merchandiser, retail.  Adems, si usted falta a tres o ms citas sin avisar a la oficina, puede ser retirado(a) de la clnica a discrecin del proveedor.      Para las solicitudes de renovacin de recetas, pida a su farmacia que se ponga en contacto con nuestra oficina y deje que transcurran 68 horas para que se complete el proceso de las renovaciones.    Hoy usted recibi los siguientes agentes de quimioterapia e/o inmunoterapia: Vidaza      Para ayudar a prevenir las nuseas y los vmitos despus de su tratamiento, le recomendamos que tome su medicamento para las nuseas segn las indicaciones.  LOS SNTOMAS QUE DEBEN COMUNICARSE INMEDIATAMENTE SE INDICAN A CONTINUACIN: *FIEBRE SUPERIOR A 100.4 F (38 C) O MS *ESCALOFROS O SUDORACIN *NUSEAS Y VMITOS QUE NO SE CONTROLAN CON EL MEDICAMENTO PARA LAS NUSEAS *DIFICULTAD INUSUAL PARA RESPIRAR  *MORETONES O HEMORRAGIAS NO HABITUALES *PROBLEMAS URINARIOS (dolor o ardor al Garment/textile technologist o frecuencia para Garment/textile technologist) *PROBLEMAS INTESTINALES (diarrea inusual, estreimiento, dolor cerca del ano) SENSIBILIDAD EN LA BOCA Y EN LA GARGANTA CON O SIN LA PRESENCIA DE LCERAS (dolor de garganta, llagas en la boca o dolor de muelas/dientes) ERUPCIN,  HINCHAZN O DOLORES INUSUALES FLUJO VAGINAL INUSUAL O PICAZN/RASQUIA    Los puntos marcados con un asterisco ( *) indican una posible emergencia y debe hacer un seguimiento tan pronto como le sea posible o vaya al Departamento de Emergencias si se le presenta algn problema.  Por favor, muestre la [place] DE ADVERTENCIA DE Windy Canny DE ADVERTENCIA DE Benay Spice al registrarse en [address] de Emergencias y a la enfermera de triaje.  Si tiene preguntas despus de su visita o necesita cancelar o volver a programar su cita, por favor pngase en contacto con [place]  Dept: [phone] y [place]. Las horas de oficina son de 8:00 a.m. a 4:30 p.m. de lunes a viernes. Por favor, tenga en cuenta que los mensajes de voz que se dejan despus de las 4:00 p.m. posiblemente no se devolvern hasta el siguiente da de [place].  Cerramos los fines de semana y [employer]. En todo momento tiene acceso a una enfermera para preguntas urgentes. Por favor, llame al nmero principal de la clnica Dept: [phone] y [place] instrucciones.   Para cualquier pregunta que no sea de carcter urgente, tambin puede ponerse en contacto con su proveedor [employer]. Ahora ofrecemos visitas electrnicas para cualquier persona mayor de 18 aos que solicite atencin mdica en lnea para los sntomas que no sean urgentes. Para ms detalles vaya a mychart.[email or web address].   Tambin puede bajar la aplicacin de MyChart! Vaya a la tienda de aplicaciones, busque "MyChart", abra la aplicacin, seleccione  [place], e ingrese con su nombre de usuario y la contrasea de Pharmacist, community.  Las mscaras son opcionales en los centros de Hotel manager. Si desea que su equipo de cuidados mdicos use una [employer] atienden, por favor hgaselo saber al personal. Bethann Berkshire una persona de apoyo que tenga por lo menos 16 aos para que le acompae a sus  citas.

## 2021-11-22 ENCOUNTER — Other Ambulatory Visit: Payer: Self-pay

## 2021-11-22 ENCOUNTER — Inpatient Hospital Stay: Payer: Medicare Other

## 2021-11-22 VITALS — BP 117/46 | HR 77 | Temp 99.0°F | Resp 18

## 2021-11-22 DIAGNOSIS — C50212 Malignant neoplasm of upper-inner quadrant of left female breast: Secondary | ICD-10-CM

## 2021-11-22 DIAGNOSIS — D649 Anemia, unspecified: Secondary | ICD-10-CM

## 2021-11-22 DIAGNOSIS — D469 Myelodysplastic syndrome, unspecified: Secondary | ICD-10-CM | POA: Diagnosis not present

## 2021-11-22 LAB — BASIC METABOLIC PANEL - CANCER CENTER ONLY
Anion gap: 7 (ref 5–15)
BUN: 13 mg/dL (ref 8–23)
CO2: 28 mmol/L (ref 22–32)
Calcium: 8.9 mg/dL (ref 8.9–10.3)
Chloride: 96 mmol/L — ABNORMAL LOW (ref 98–111)
Creatinine: 0.56 mg/dL (ref 0.44–1.00)
GFR, Estimated: >60 mL/min (ref >60)
Glucose, Bld: 302 mg/dL — ABNORMAL HIGH (ref 70–99)
Potassium: 4 mmol/L (ref 3.5–5.1)
Sodium: 131 mmol/L — ABNORMAL LOW (ref 135–145)

## 2021-11-22 LAB — CBC WITH DIFFERENTIAL (CANCER CENTER ONLY)
Abs Immature Granulocytes: 0.01 10*3/uL (ref 0.00–0.07)
Basophils Absolute: 0 10*3/uL (ref 0.0–0.1)
Basophils Relative: 1 %
Eosinophils Absolute: 0 10*3/uL (ref 0.0–0.5)
Eosinophils Relative: 0 %
HCT: 21.5 % — ABNORMAL LOW (ref 36.0–46.0)
Hemoglobin: 7.4 g/dL — ABNORMAL LOW (ref 12.0–15.0)
Immature Granulocytes: 0 %
Lymphocytes Relative: 53 %
Lymphs Abs: 1.2 10*3/uL (ref 0.7–4.0)
MCH: 30.7 pg (ref 26.0–34.0)
MCHC: 34.4 g/dL (ref 30.0–36.0)
MCV: 89.2 fL (ref 80.0–100.0)
Monocytes Absolute: 0.3 10*3/uL (ref 0.1–1.0)
Monocytes Relative: 12 %
Neutro Abs: 0.8 10*3/uL — ABNORMAL LOW (ref 1.7–7.7)
Neutrophils Relative %: 34 %
Platelet Count: 28 10*3/uL — ABNORMAL LOW (ref 150–400)
RBC: 2.41 MIL/uL — ABNORMAL LOW (ref 3.87–5.11)
RDW: 16 % — ABNORMAL HIGH (ref 11.5–15.5)
WBC Count: 2.3 10*3/uL — ABNORMAL LOW (ref 4.0–10.5)
nRBC: 0 % (ref 0.0–0.2)

## 2021-11-22 LAB — PREPARE RBC (CROSSMATCH)

## 2021-11-22 MED ORDER — ONDANSETRON HCL 8 MG PO TABS
8.0000 mg | ORAL_TABLET | Freq: Once | ORAL | Status: AC
  Administered 2021-11-22: 8 mg via ORAL
  Filled 2021-11-22: qty 1

## 2021-11-22 MED ORDER — AZACITIDINE CHEMO SQ INJECTION
75.0000 mg/m2 | Freq: Once | INTRAMUSCULAR | Status: AC
  Administered 2021-11-22: 112.5 mg via SUBCUTANEOUS
  Filled 2021-11-22: qty 4.5

## 2021-11-22 NOTE — Patient Instructions (Signed)
Instrucciones al darle de alta: Discharge Instructions Gracias por elegir al [facility] de Cncer de [place] para brindarle atencin mdica de oncologa y Music therapist.   Si usted tiene una cita de laboratorio con [place], por favor vaya directamente [place] y regstrese en el rea de Control and instrumentation engineer.   Use ropa cmoda y [place] para tener fcil acceso a las vas del Portacath (acceso venoso de Engineer, site duracin) o la lnea PICC (catter central colocado por va perifrica).   Nos esforzamos por ofrecerle tiempo de calidad con su proveedor. Es posible que tenga que volver a programar su cita si llega tarde (15 minutos o ms).  El llegar tarde le afecta a usted y a otros pacientes cuyas citas son posteriores a Merchandiser, retail.  Adems, si usted falta a tres o ms citas sin avisar a la oficina, puede ser retirado(a) de la clnica a discrecin del proveedor.      Para las solicitudes de renovacin de recetas, pida a su farmacia que se ponga en contacto con nuestra oficina y deje que transcurran 48 horas para que se complete el proceso de las renovaciones.    Hoy usted recibi los siguientes agentes de quimioterapia e/o inmunoterapia: Vidaza      Para ayudar a prevenir las nuseas y los vmitos despus de su tratamiento, le recomendamos que tome su medicamento para las nuseas segn las indicaciones.  LOS SNTOMAS QUE DEBEN COMUNICARSE INMEDIATAMENTE SE INDICAN A CONTINUACIN: *FIEBRE SUPERIOR A 100.4 F (38 C) O MS *ESCALOFROS O SUDORACIN *NUSEAS Y VMITOS QUE NO SE CONTROLAN CON EL MEDICAMENTO PARA LAS NUSEAS *DIFICULTAD INUSUAL PARA RESPIRAR  *MORETONES O HEMORRAGIAS NO HABITUALES *PROBLEMAS URINARIOS (dolor o ardor al Garment/textile technologist o frecuencia para Garment/textile technologist) *PROBLEMAS INTESTINALES (diarrea inusual, estreimiento, dolor cerca del ano) SENSIBILIDAD EN LA BOCA Y EN LA GARGANTA CON O SIN LA PRESENCIA DE LCERAS (dolor de garganta, llagas en la boca o dolor de muelas/dientes) ERUPCIN,  HINCHAZN O DOLORES INUSUALES FLUJO VAGINAL INUSUAL O PICAZN/RASQUIA    Los puntos marcados con un asterisco ( *) indican una posible emergencia y debe hacer un seguimiento tan pronto como le sea posible o vaya al Departamento de Emergencias si se le presenta algn problema.  Por favor, muestre la [place] DE ADVERTENCIA DE Windy Canny DE ADVERTENCIA DE Benay Spice al registrarse en [address] de Emergencias y a la enfermera de triaje.  Si tiene preguntas despus de su visita o necesita cancelar o volver a programar su cita, por favor pngase en contacto con [place]  Dept: [phone] y [place]. Las horas de oficina son de 8:00 a.m. a 4:30 p.m. de lunes a viernes. Por favor, tenga en cuenta que los mensajes de voz que se dejan despus de las 4:00 p.m. posiblemente no se devolvern hasta el siguiente da de [place].  Cerramos los fines de semana y [employer]. En todo momento tiene acceso a una enfermera para preguntas urgentes. Por favor, llame al nmero principal de la clnica Dept: [phone] y [place] instrucciones.   Para cualquier pregunta que no sea de carcter urgente, tambin puede ponerse en contacto con su proveedor [employer]. Ahora ofrecemos visitas electrnicas para cualquier persona mayor de 18 aos que solicite atencin mdica en lnea para los sntomas que no sean urgentes. Para ms detalles vaya a mychart.[email or web address].   Tambin puede bajar la aplicacin de MyChart! Vaya a la tienda de aplicaciones, busque "MyChart", abra la aplicacin, seleccione  [place], e ingrese con su nombre de usuario y la contrasea de Pharmacist, community.  Las mscaras son opcionales en los centros de Hotel manager. Si desea que su equipo de cuidados mdicos use una [employer] atienden, por favor hgaselo saber al personal. Bethann Berkshire una persona de apoyo que tenga por lo menos 16 aos para que le acompae a sus  citas.

## 2021-11-22 NOTE — Progress Notes (Signed)
[name] notified of CBC results. OK per [name] to proceed with D8/C1 Vidaza today with [place] .8, Plt 28 and Hgb 7.4.She will be scheduled for a blood transfusion

## 2021-11-23 ENCOUNTER — Other Ambulatory Visit: Payer: Medicare Other

## 2021-11-23 ENCOUNTER — Inpatient Hospital Stay: Payer: Medicare Other

## 2021-11-23 VITALS — BP 114/48 | HR 69 | Temp 98.3°F | Resp 16 | Ht 60.0 in | Wt 113.5 lb

## 2021-11-23 DIAGNOSIS — Z170 Estrogen receptor positive status [ER+]: Secondary | ICD-10-CM

## 2021-11-23 DIAGNOSIS — D469 Myelodysplastic syndrome, unspecified: Secondary | ICD-10-CM | POA: Diagnosis not present

## 2021-11-23 DIAGNOSIS — D649 Anemia, unspecified: Secondary | ICD-10-CM

## 2021-11-23 MED ORDER — DIPHENHYDRAMINE HCL 25 MG PO CAPS
25.0000 mg | ORAL_CAPSULE | Freq: Once | ORAL | Status: AC
  Administered 2021-11-23: 25 mg via ORAL
  Filled 2021-11-23: qty 1

## 2021-11-23 MED ORDER — SODIUM CHLORIDE 0.9% IV SOLUTION
250.0000 mL | Freq: Once | INTRAVENOUS | Status: AC
  Administered 2021-11-23: 250 mL via INTRAVENOUS

## 2021-11-23 MED ORDER — AZACITIDINE CHEMO SQ INJECTION
75.0000 mg/m2 | Freq: Once | INTRAMUSCULAR | Status: AC
  Administered 2021-11-23: 112.5 mg via SUBCUTANEOUS
  Filled 2021-11-23: qty 4.5

## 2021-11-23 MED ORDER — ONDANSETRON HCL 8 MG PO TABS
8.0000 mg | ORAL_TABLET | Freq: Once | ORAL | Status: AC
  Administered 2021-11-23: 8 mg via ORAL
  Filled 2021-11-23: qty 1

## 2021-11-23 MED ORDER — ACETAMINOPHEN 325 MG PO TABS
650.0000 mg | ORAL_TABLET | Freq: Once | ORAL | Status: AC
  Administered 2021-11-23: 650 mg via ORAL
  Filled 2021-11-23: qty 2

## 2021-11-23 NOTE — Patient Instructions (Signed)
Instrucciones al darle de alta: Discharge Instructions Gracias por elegir al [facility] de Cncer de [place] para brindarle atencin mdica de oncologa y Music therapist.   Si usted tiene una cita de laboratorio con [place], por favor vaya directamente [place] y regstrese en el rea de Control and instrumentation engineer.   Use ropa cmoda y [place] para tener fcil acceso a las vas del Portacath (acceso venoso de Engineer, site duracin) o la lnea PICC (catter central colocado por va perifrica).   Nos esforzamos por ofrecerle tiempo de calidad con su proveedor. Es posible que tenga que volver a programar su cita si llega tarde (15 minutos o ms).  El llegar tarde le afecta a usted y a otros pacientes cuyas citas son posteriores a Merchandiser, retail.  Adems, si usted falta a tres o ms citas sin avisar a la oficina, puede ser retirado(a) de la clnica a discrecin del proveedor.      Para las solicitudes de renovacin de recetas, pida a su farmacia que se ponga en contacto con nuestra oficina y deje que transcurran 18 horas para que se complete el proceso de las renovaciones.    Hoy usted recibi los siguientes agentes de quimioterapia e/o inmunoterapia: Azacitidine (Vidaza)   Para ayudar a prevenir las nuseas y los vmitos despus de su tratamiento, le recomendamos que tome su medicamento para las nuseas segn las indicaciones.  LOS SNTOMAS QUE DEBEN COMUNICARSE INMEDIATAMENTE SE INDICAN A CONTINUACIN: *FIEBRE SUPERIOR A 100.4 F (38 C) O MS *ESCALOFROS O SUDORACIN *NUSEAS Y VMITOS QUE NO SE CONTROLAN CON EL MEDICAMENTO PARA LAS NUSEAS *DIFICULTAD INUSUAL PARA RESPIRAR  *MORETONES O HEMORRAGIAS NO HABITUALES *PROBLEMAS URINARIOS (dolor o ardor al Garment/textile technologist o frecuencia para Garment/textile technologist) *PROBLEMAS INTESTINALES (diarrea inusual, estreimiento, dolor cerca del ano) SENSIBILIDAD EN LA BOCA Y EN LA GARGANTA CON O SIN LA PRESENCIA DE LCERAS (dolor de garganta, llagas en la boca o dolor de  muelas/dientes) ERUPCIN, HINCHAZN O DOLORES INUSUALES FLUJO VAGINAL INUSUAL O PICAZN/RASQUIA    Los puntos marcados con un asterisco ( *) indican una posible emergencia y debe hacer un seguimiento tan pronto como le sea posible o vaya al Departamento de Emergencias si se le presenta algn problema.  Por favor, muestre la [place] DE ADVERTENCIA DE Windy Canny DE ADVERTENCIA DE Benay Spice al registrarse en [address] de Emergencias y a la enfermera de triaje.  Si tiene preguntas despus de su visita o necesita cancelar o volver a programar su cita, por favor pngase en contacto con [place]  Dept: [phone] y [place]. Las horas de oficina son de 8:00 a.m. a 4:30 p.m. de lunes a viernes. Por favor, tenga en cuenta que los mensajes de voz que se dejan despus de las 4:00 p.m. posiblemente no se devolvern hasta el siguiente da de [place].  Cerramos los fines de semana y [employer]. En todo momento tiene acceso a una enfermera para preguntas urgentes. Por favor, llame al nmero principal de la clnica Dept: [phone] y [place] instrucciones.   Para cualquier pregunta que no sea de carcter urgente, tambin puede ponerse en contacto con su proveedor [employer] Inc. Ahora ofrecemos visitas electrnicas para cualquier persona mayor de 18 aos que solicite atencin mdica en lnea para los sntomas que no sean urgentes. Para ms detalles vaya a [email or web address].   Tambin puede bajar la aplicacin de MyChart! Vaya a la tienda de aplicaciones, busque "MyChart", abra la aplicacin, seleccione [employer],  e ingrese con su nombre de usuario y la contrasea de Pharmacist, community.  Las mscaras son opcionales en los centros de Hotel manager. Si desea que su equipo de cuidados mdicos use una [employer] atienden, por favor hgaselo saber al personal. Bethann Berkshire una persona de apoyo que tenga por lo menos 16 aos  para que le acompae a sus citas. Azacitidine Injection Qu es este medicamento? La AZACITIDINA es un agente quimioteraputico. Este medicamento reduce el crecimiento de clulas cancerosas y puede suprimir el sistema inmunolgico. Se utiliza tambin para el tratamiento de sndrome mielodisplsticos o algunos tipos de leucemia. Este medicamento puede ser utilizado para otros usos; si tiene alguna pregunta consulte con su proveedor de atencin mdica o con su farmacutico. MARCAS COMUNES: Vidaza Qu le debo informar a mi profesional de la salud antes de tomar este medicamento? Necesitan saber si usted presenta alguno de los siguientes problemas o situaciones: enfermedad renal enfermedad heptica tumores hepticos una reaccin alrgica o inusual a la azacitidina, al manitol, a otros medicamentos, alimentos, colorantes o conservantes si est embarazada o buscando quedar embarazada si est amamantando a un beb Cmo debo utilizar este medicamento? Este medicamento se administra mediante inyeccin por va subcutnea. Lo administra un profesional de la salud calificado en un hospital o en un entorno clnico. Hable con su pediatra para informarse acerca del uso de este medicamento en nios. Puede requerir atencin especial. Sobredosis: Pngase en contacto inmediatamente con un centro toxicolgico o una sala de urgencia si usted cree que haya tomado demasiado medicamento. ATENCIN: [employer] es solo para usted. No comparta este medicamento con nadie. Qu sucede si me olvido de una dosis? Es importante no olvidar ninguna dosis. Informe a su mdico o a su profesional de la salud si no puede asistir a Photographer. Qu puede interactuar con este medicamento? No se han estudiado las interacciones. Suministre a Conservation officer, historic buildings de atencin mdica una lista de todos los [place], hierbas, medicamentos de [place], o suplementos dietticos que [place]. Dgales tambin si fuma, bebe alcohol, o  utiliza drogas ilegales. Algunos elementos pueden interactuar con su medicamento. Puede ser que esta lista no menciona todas las posibles interacciones. Informe a su profesional de KB Home	[place] de [employer] Corporation productos a base de hierbas, medicamentos de [place] o suplementos nutritivos que est tomando. Si usted fuma, consume bebidas alcohlicas o si utiliza drogas ilegales, indqueselo tambin a su profesional de KB Home	[place]. Algunas sustancias pueden interactuar con su medicamento. A qu debo estar atento al usar Coca-Cola? Visite a su mdico para que revise su evolucin. Este medicamento podra hacerle sentir un Nurse, mental health. Esto es normal ya que la quimioterapia puede Print production planner tanto a las clulas sanas como a las clulas cancerosas. Si presenta algn efecto secundario, infrmelo. Contine con el tratamiento aun si se siente enfermo, a menos que su mdico le indique que lo suspenda. En algunos casos, podra recibir Limited Brands para ayudarlo con los efectos secundarios. Siga todas las instrucciones para usarlos. Consulte a su mdico o a su profesional de la salud si tiene fiebre, escalofros o dolor de garganta, o cualquier otro sntoma de resfro o gripe. No se trate usted mismo. Este medicamento reduce la capacidad del cuerpo para combatir infecciones. Trate de no acercarse a personas que estn enfermas. Este medicamento podra aumentar el riesgo de moretones o sangrado. Consulte a su mdico o a su profesional de la salud si observa sangrados inusuales. Usted podra necesitar realizarse [employer] de sangre mientras est usando [place]. No  debe quedar embarazada mientras est tomando este medicamento y por 6 meses despus de la ltima dosis. Las mujeres deben informar a su mdico si estn buscando quedar embarazadas o si creen que podran estar embarazadas. Los hombres no deben tener hijos mientras estn recibiendo Coca-Cola y durante 3 meses despus de la ltima dosis.  Existe la posibilidad de efectos secundarios graves en un beb sin nacer. Para obtener ms informacin, hable con su profesional de la salud o su farmacutico. No debe amamantar a un beb mientras est usando este medicamento y por 1 semana despus de la ltima dosis. Este medicamento puede interferir con la capacidad de tener hijos. Hable con su mdico o su profesional de la salud si est preocupado por su fertilidad. Qu efectos secundarios puedo tener al Mas[employer] Corporation este medicamento? Efectos secundarios que debe informar a su mdico o a Barrister's clerk de la salud tan pronto como sea posible: Chief of Staff, como erupcin cutnea, comezn/picazn o urticarias, e hinchazn de la cara, los labios o la lengua recuentos sanguneos bajos: este medicamento podra reducir la cantidad de glbulos blancos, glbulos rojos y plaquetas. Su riesgo de infeccin y sangrado podra ser mayor. signos de infeccin: fiebre o escalofros, tos, dolor de garganta, dolor al orinar signos de disminucin en la cantidad de plaquetas o sangrado: moretones, puntos rojos en la piel, heces de color negro y aspecto alquitranado, sangre en la orina signos de disminucin en la cantidad de glbulos rojos: cansancio o debilidad inusual, Youth worker, aturdimiento signos y sntomas de lesin al rin, tales como dificultad para orinar o cambios en la cantidad de orina signos y sntomas de lesin al hgado, como orina amarilla oscura o [place]; sensacin general de estar enfermo o sntomas gripales; heces claras; prdida de apetito; nuseas; dolor en la regin abdominal superior derecha; cansancio o debilidad inusual; color amarillento de los ojos o la piel Efectos secundarios que generalmente no requieren atencin mdica (infrmelos a su mdico o a su profesional de la salud si persisten o si son molestos): estreimiento diarrea nuseas, vmito dolor o enrojecimiento en TEFL teacher de la inyeccin cansancio o debilidad inusual Puede ser  que esta lista no menciona todos los posibles efectos secundarios. Comunquese a su mdico por asesoramiento mdico Humana Inc. Usted puede informar los efectos secundarios a la FDA por telfono al 1-800-FDA-1088. Dnde debo guardar mi medicina? Este medicamento se administra en hospitales o clnicas y no necesitar guardarlo en su domicilio. ATENCIN: Este folleto es un resumen. Puede ser que no cubra toda la posible informacin. Si usted tiene preguntas acerca de esta medicina, consulte con su mdico, su farmacutico o su profesional de Technical sales engineer.  2023 Elsevier/Gold Standard (2016-03-24 00:00:00) Transfusin de [employer] adultos, cuidados posteriores Blood Transfusion, Adult, Care After Despus de una transfusin de [place], es comn presentar lo siguiente: [place] y Management consultant de la va intravenosa (IV). Dolor de [place]. Siga estas instrucciones en su casa: El mdico podr darle ms instrucciones. Si tiene problemas, llame al [employer]. Cuidados del lugar de la insercin     Siga las instrucciones del mdico en lo que respecta al cuidado del Environmental consultant de insercin. Este es el lugar donde se coloc un tubo (catter) intravenoso en la vena. Asegrese de hacer lo siguiente: Lvese las manos con agua y jabn durante al menos 20 segundos antes y despus de cambiarse la venda. Use un desinfectante para manos si no dispone de [place] y [place]. Cambie las vendas como se lo haya indicado  el mdico. Psychiatric nurse de insercin todos los das para detectar signos de infeccin. Est atento a los siguientes signos: Dolor, hinchazn o enrojecimiento. Sangrado proveniente del Environmental consultant. [place]. Pus o mal olor. Instrucciones generales Use los medicamentos de venta libre y los recetados solamente como se lo haya indicado el mdico. Haga reposo como se lo haya indicado el mdico. Retome sus actividades habituales como se lo haya indicado el mdico. Concurra a todas las visitas de  seguimiento. Es posible que deba hacerse anlisis en ciertos momentos para Materials engineer. Comunquese con un mdico si: Tiene picazn o zonas enrojecidas e hinchadas en la piel (urticaria). Tiene fiebre o escalofros. Tiene dolor de cabeza, en la espalda o el pecho. Est preocupado o nervioso (ansioso). Se siente dbil despus de realizar sus actividades habituales. Tiene alguno de los siguientes problemas en el lugar de la insercin: Enrojecimiento, hinchazn, calor o dolor. Sangrado que no se detiene al [employer] presin. Pus o mal olor. Si recibi la transfusin de sangre en un entorno para pacientes ambulatorios, le indicarn con quin debe ponerse en contacto para informar cualquier reaccin. Solicite ayuda de inmediato si: Tiene signos de una reaccin grave. Puede deberse a IT trainer o provenir del sistema de defensa del cuerpo (sistema inmunitario). Algunos signos son los siguientes: Dificultad para respirar o falta de aire. Hinchazn en la cara o sensacin de calor (sofoco). Una erupcin cutnea generalizada. Pis (orina) de color oscuro o sangre en el pis. Latidos cardacos acelerados. Estos sntomas pueden Sales executive. Solicite ayuda de inmediato. Llame al 911. No espere a ver si los sntomas desaparecen. No conduzca por sus propios medios Principal Financial. Resumen Es normal tener moretones y Management consultant donde se coloc la va intravenosa (IV). Psychiatric nurse de insercin todos los das para detectar signos de infeccin. Haga reposo como se lo haya indicado el mdico. Retome sus actividades habituales como se lo haya indicado el mdico. Obtenga ayuda de inmediato si tiene signos de [place] reaccin grave. Esta informacin no tiene Marine scientist el consejo del mdico. Asegrese de hacerle al mdico cualquier pregunta que tenga. Document Revised: 05/06/2021 Document Reviewed: 05/06/2021 Elsevier Patient Education  [place].

## 2021-11-24 ENCOUNTER — Encounter (HOSPITAL_COMMUNITY): Payer: Self-pay | Admitting: Hematology

## 2021-11-24 LAB — TYPE AND SCREEN
ABO/RH(D): O POS
Antibody Screen: NEGATIVE
Unit division: 0
Unit division: 0

## 2021-11-24 LAB — BPAM RBC
Blood Product Expiration Date: 202311272359
Blood Product Expiration Date: 202311272359
ISSUE DATE / TIME: 202310311259
ISSUE DATE / TIME: 202310311259
Unit Type and Rh: 5100
Unit Type and Rh: 5100

## 2021-11-24 NOTE — Chronic Care Management (AMB) (Signed)
  Care Coordination  Outreach Note  11/24/2021 Name: Amanda Duarte MRN: [id number] DOB: 09/01/1955   Care Coordination Outreach Attempts: A second unsuccessful outreach was attempted today to offer the patient with information about available care coordination services as a benefit of their health plan.     Follow Up Plan:  Additional outreach attempts will be made to offer the patient care coordination information and services.   Encounter Outcome:  No Answer  [place]  Direct Dial: [phone]

## 2021-11-26 ENCOUNTER — Telehealth: Payer: Self-pay

## 2021-11-26 ENCOUNTER — Other Ambulatory Visit: Payer: Self-pay

## 2021-11-26 ENCOUNTER — Inpatient Hospital Stay: Payer: Medicare Other | Attending: Hematology

## 2021-11-26 DIAGNOSIS — D61818 Other pancytopenia: Secondary | ICD-10-CM | POA: Diagnosis not present

## 2021-11-26 DIAGNOSIS — D469 Myelodysplastic syndrome, unspecified: Secondary | ICD-10-CM | POA: Insufficient documentation

## 2021-11-26 DIAGNOSIS — Z170 Estrogen receptor positive status [ER+]: Secondary | ICD-10-CM

## 2021-11-26 DIAGNOSIS — D649 Anemia, unspecified: Secondary | ICD-10-CM

## 2021-11-26 LAB — CBC WITH DIFFERENTIAL (CANCER CENTER ONLY)
Abs Immature Granulocytes: 0 10*3/uL (ref 0.00–0.07)
Basophils Absolute: 0 10*3/uL (ref 0.0–0.1)
Basophils Relative: 1 %
Eosinophils Absolute: 0 10*3/uL (ref 0.0–0.5)
Eosinophils Relative: 1 %
HCT: 28.1 % — ABNORMAL LOW (ref 36.0–46.0)
Hemoglobin: 9.9 g/dL — ABNORMAL LOW (ref 12.0–15.0)
Immature Granulocytes: 0 %
Lymphocytes Relative: 67 %
Lymphs Abs: 1.5 10*3/uL (ref 0.7–4.0)
MCH: 31 pg (ref 26.0–34.0)
MCHC: 35.2 g/dL (ref 30.0–36.0)
MCV: 88.1 fL (ref 80.0–100.0)
Monocytes Absolute: 0.1 10*3/uL (ref 0.1–1.0)
Monocytes Relative: 4 %
Neutro Abs: 0.6 10*3/uL — ABNORMAL LOW (ref 1.7–7.7)
Neutrophils Relative %: 27 %
Platelet Count: 13 10*3/uL — ABNORMAL LOW (ref 150–400)
RBC: 3.19 MIL/uL — ABNORMAL LOW (ref 3.87–5.11)
RDW: 15.1 % (ref 11.5–15.5)
WBC Count: 2.2 10*3/uL — ABNORMAL LOW (ref 4.0–10.5)
nRBC: 0 % (ref 0.0–0.2)

## 2021-11-26 LAB — CMP (CANCER CENTER ONLY)
ALT: 5 U/L (ref 0–44)
AST: 11 U/L — ABNORMAL LOW (ref 15–41)
Albumin: 3.5 g/dL (ref 3.5–5.0)
Alkaline Phosphatase: 84 U/L (ref 38–126)
Anion gap: 7 (ref 5–15)
BUN: 12 mg/dL (ref 8–23)
CO2: 27 mmol/L (ref 22–32)
Calcium: 9.2 mg/dL (ref 8.9–10.3)
Chloride: 95 mmol/L — ABNORMAL LOW (ref 98–111)
Creatinine: 0.56 mg/dL (ref 0.44–1.00)
GFR, Estimated: >60 mL/min (ref >60)
Glucose, Bld: 284 mg/dL — ABNORMAL HIGH (ref 70–99)
Potassium: 3.7 mmol/L (ref 3.5–5.1)
Sodium: 129 mmol/L — ABNORMAL LOW (ref 135–145)
Total Bilirubin: 0.6 mg/dL (ref 0.3–1.2)
Total Protein: 9.1 g/dL — ABNORMAL HIGH (ref 6.5–8.1)

## 2021-11-26 NOTE — Progress Notes (Signed)
  Care Coordination   Note   11/26/2021 Name: RUTHMARY OCCHIPINTI MRN: [id number] DOB: October 29, 1955  CLOIS MONTAVON is a 66 y.o. year old female who sees [name], NP for primary care. I reached out to [employer] by phone today to offer care coordination services.  Ms. Rosello was given information about Care Coordination services today including:   The Care Coordination services include support from the care team which includes your Nurse Coordinator, Clinical Social Worker, or Pharmacist.  The Care Coordination team is here to help remove barriers to the health concerns and goals most important to you. Care Coordination services are voluntary, and the patient may decline or stop services at any time by request to their care team member.   Care Coordination Consent Status: Patient agreed to services and verbal consent obtained.   Follow up plan:  Telephone appointment with care coordination team member scheduled for:  12/08/21  Encounter Outcome:  Pt. Scheduled  [place]  Direct Dial: [phone]

## 2021-11-26 NOTE — Telephone Encounter (Signed)
Spoke with pt's daughter to confirm pt's appointment for plt transfusion on 11/27/2021 '@8am'$ .  [name] confirmed appt and stated pt will be here.

## 2021-11-27 ENCOUNTER — Inpatient Hospital Stay: Payer: Medicare Other

## 2021-11-27 DIAGNOSIS — C50212 Malignant neoplasm of upper-inner quadrant of left female breast: Secondary | ICD-10-CM

## 2021-11-27 DIAGNOSIS — D61818 Other pancytopenia: Secondary | ICD-10-CM

## 2021-11-27 DIAGNOSIS — D649 Anemia, unspecified: Secondary | ICD-10-CM

## 2021-11-27 DIAGNOSIS — D469 Myelodysplastic syndrome, unspecified: Secondary | ICD-10-CM | POA: Diagnosis not present

## 2021-11-27 MED ORDER — DIPHENHYDRAMINE HCL 25 MG PO CAPS
25.0000 mg | ORAL_CAPSULE | Freq: Once | ORAL | Status: AC
  Administered 2021-11-27: 25 mg via ORAL
  Filled 2021-11-27: qty 1

## 2021-11-27 MED ORDER — ACETAMINOPHEN 325 MG PO TABS
650.0000 mg | ORAL_TABLET | Freq: Once | ORAL | Status: AC
  Administered 2021-11-27: 650 mg via ORAL
  Filled 2021-11-27: qty 2

## 2021-11-27 MED ORDER — SODIUM CHLORIDE 0.9% IV SOLUTION
250.0000 mL | Freq: Once | INTRAVENOUS | Status: AC
  Administered 2021-11-27: 250 mL via INTRAVENOUS

## 2021-11-27 NOTE — Patient Instructions (Signed)
Blood Transfusion, Adult, Care After The following information offers guidance on how to care for yourself after your procedure. Your health care provider may also give you more specific instructions. If you have problems or questions, contact your health care provider. What can I expect after the procedure? After the procedure, it is common to have: Bruising and soreness where the IV was inserted. A headache. Follow these instructions at home: IV insertion site care     Follow instructions from your health care provider about how to take care of your IV insertion site. Make sure you: Wash your hands with soap and water for at least 20 seconds before and after you change your bandage (dressing). If soap and water are not available, use hand sanitizer. Change your dressing as told by your health care provider. Check your IV insertion site every day for signs of infection. Check for: Redness, swelling, or pain. Bleeding from the site. Warmth. Pus or a bad smell. General instructions Take over-the-counter and prescription medicines only as told by your health care provider. Rest as told by your health care provider. Return to your normal activities as told by your health care provider. Keep all follow-up visits. Lab tests may need to be done at certain periods to recheck your blood counts. Contact a health care provider if: You have itching or red, swollen areas of skin (hives). You have a fever or chills. You have pain in the head, back, or chest. You feel anxious or you feel weak after doing your normal activities. You have redness, swelling, warmth, or pain around the IV insertion site. You have blood coming from the IV insertion site that does not stop with pressure. You have pus or a bad smell coming from your IV insertion site. If you received your blood transfusion in an outpatient setting, you will be told whom to contact to report any reactions. Get help right away if: You  have symptoms of a serious allergic or immune system reaction, including: Trouble breathing or shortness of breath. Swelling of the face, feeling flushed, or widespread rash. Dark urine or blood in the urine. Fast heartbeat. These symptoms may be an emergency. Get help right away. Call 911. Do not wait to see if the symptoms will go away. Do not drive yourself to the hospital. Summary Bruising and soreness around the IV insertion site are common. Check your IV insertion site every day for signs of infection. Rest as told by your health care provider. Return to your normal activities as told by your health care provider. Get help right away for symptoms of a serious allergic or immune system reaction to the blood transfusion. This information is not intended to replace advice given to you by your health care provider. Make sure you discuss any questions you have with your health care provider. Document Revised: 04/09/2021 Document Reviewed: 04/09/2021 Elsevier Patient Education  2023 Elsevier Inc.  

## 2021-11-29 LAB — BPAM PLATELET PHERESIS
Blood Product Expiration Date: 202311062359
ISSUE DATE / TIME: 202311040856
Unit Type and Rh: 5100

## 2021-11-29 LAB — PREPARE PLATELET PHERESIS: Unit division: 0

## 2021-12-01 ENCOUNTER — Inpatient Hospital Stay (HOSPITAL_COMMUNITY)
Admission: EM | Admit: 2021-12-01 | Discharge: 2021-12-22 | DRG: 871 | Disposition: A | Discharge status: Home Health | Payer: Medicare Other | Attending: Internal Medicine | Admitting: Internal Medicine

## 2021-12-01 ENCOUNTER — Emergency Department (HOSPITAL_COMMUNITY): Payer: Medicare Other

## 2021-12-01 ENCOUNTER — Encounter (HOSPITAL_COMMUNITY): Payer: Self-pay | Admitting: *Deleted

## 2021-12-01 DIAGNOSIS — K219 Gastro-esophageal reflux disease without esophagitis: Secondary | ICD-10-CM | POA: Diagnosis present

## 2021-12-01 DIAGNOSIS — T380X5A Adverse effect of glucocorticoids and synthetic analogues, initial encounter: Secondary | ICD-10-CM | POA: Diagnosis present

## 2021-12-01 DIAGNOSIS — Z7984 Long term (current) use of oral hypoglycemic drugs: Secondary | ICD-10-CM

## 2021-12-01 DIAGNOSIS — M545 Low back pain, unspecified: Secondary | ICD-10-CM | POA: Diagnosis not present

## 2021-12-01 DIAGNOSIS — E11649 Type 2 diabetes mellitus with hypoglycemia without coma: Secondary | ICD-10-CM | POA: Diagnosis not present

## 2021-12-01 DIAGNOSIS — J189 Pneumonia, unspecified organism: Secondary | ICD-10-CM | POA: Diagnosis present

## 2021-12-01 DIAGNOSIS — J181 Lobar pneumonia, unspecified organism: Secondary | ICD-10-CM | POA: Diagnosis present

## 2021-12-01 DIAGNOSIS — Y92239 Unspecified place in hospital as the place of occurrence of the external cause: Secondary | ICD-10-CM | POA: Diagnosis not present

## 2021-12-01 DIAGNOSIS — E876 Hypokalemia: Secondary | ICD-10-CM | POA: Diagnosis present

## 2021-12-01 DIAGNOSIS — I4721 Torsades de pointes: Secondary | ICD-10-CM | POA: Diagnosis not present

## 2021-12-01 DIAGNOSIS — R579 Shock, unspecified: Secondary | ICD-10-CM | POA: Diagnosis not present

## 2021-12-01 DIAGNOSIS — E1169 Type 2 diabetes mellitus with other specified complication: Secondary | ICD-10-CM | POA: Diagnosis present

## 2021-12-01 DIAGNOSIS — Z8673 Personal history of transient ischemic attack (TIA), and cerebral infarction without residual deficits: Secondary | ICD-10-CM

## 2021-12-01 DIAGNOSIS — Z888 Allergy status to other drugs, medicaments and biological substances status: Secondary | ICD-10-CM

## 2021-12-01 DIAGNOSIS — T4275XA Adverse effect of unspecified antiepileptic and sedative-hypnotic drugs, initial encounter: Secondary | ICD-10-CM | POA: Diagnosis not present

## 2021-12-01 DIAGNOSIS — Z6825 Body mass index (BMI) 25.0-25.9, adult: Secondary | ICD-10-CM

## 2021-12-01 DIAGNOSIS — D46Z Other myelodysplastic syndromes: Secondary | ICD-10-CM | POA: Diagnosis present

## 2021-12-01 DIAGNOSIS — Z170 Estrogen receptor positive status [ER+]: Secondary | ICD-10-CM

## 2021-12-01 DIAGNOSIS — A419 Sepsis, unspecified organism: Principal | ICD-10-CM

## 2021-12-01 DIAGNOSIS — E861 Hypovolemia: Secondary | ICD-10-CM | POA: Diagnosis present

## 2021-12-01 DIAGNOSIS — J80 Acute respiratory distress syndrome: Secondary | ICD-10-CM | POA: Diagnosis present

## 2021-12-01 DIAGNOSIS — I493 Ventricular premature depolarization: Secondary | ICD-10-CM | POA: Diagnosis not present

## 2021-12-01 DIAGNOSIS — Z833 Family history of diabetes mellitus: Secondary | ICD-10-CM

## 2021-12-01 DIAGNOSIS — R5081 Fever presenting with conditions classified elsewhere: Secondary | ICD-10-CM | POA: Diagnosis present

## 2021-12-01 DIAGNOSIS — E785 Hyperlipidemia, unspecified: Secondary | ICD-10-CM | POA: Diagnosis present

## 2021-12-01 DIAGNOSIS — Z66 Do not resuscitate: Secondary | ICD-10-CM | POA: Diagnosis present

## 2021-12-01 DIAGNOSIS — Z853 Personal history of malignant neoplasm of breast: Secondary | ICD-10-CM

## 2021-12-01 DIAGNOSIS — D709 Neutropenia, unspecified: Secondary | ICD-10-CM | POA: Diagnosis present

## 2021-12-01 DIAGNOSIS — F419 Anxiety disorder, unspecified: Secondary | ICD-10-CM | POA: Diagnosis present

## 2021-12-01 DIAGNOSIS — Z515 Encounter for palliative care: Secondary | ICD-10-CM

## 2021-12-01 DIAGNOSIS — R6520 Severe sepsis without septic shock: Principal | ICD-10-CM

## 2021-12-01 DIAGNOSIS — I10 Essential (primary) hypertension: Secondary | ICD-10-CM | POA: Diagnosis present

## 2021-12-01 DIAGNOSIS — Y929 Unspecified place or not applicable: Secondary | ICD-10-CM

## 2021-12-01 DIAGNOSIS — D61818 Other pancytopenia: Secondary | ICD-10-CM | POA: Diagnosis present

## 2021-12-01 DIAGNOSIS — Z794 Long term (current) use of insulin: Secondary | ICD-10-CM

## 2021-12-01 DIAGNOSIS — E871 Hypo-osmolality and hyponatremia: Secondary | ICD-10-CM | POA: Diagnosis present

## 2021-12-01 DIAGNOSIS — E1165 Type 2 diabetes mellitus with hyperglycemia: Secondary | ICD-10-CM

## 2021-12-01 DIAGNOSIS — E860 Dehydration: Secondary | ICD-10-CM | POA: Diagnosis present

## 2021-12-01 DIAGNOSIS — Z1152 Encounter for screening for COVID-19: Secondary | ICD-10-CM

## 2021-12-01 DIAGNOSIS — I472 Ventricular tachycardia, unspecified: Secondary | ICD-10-CM | POA: Diagnosis not present

## 2021-12-01 DIAGNOSIS — T451X5A Adverse effect of antineoplastic and immunosuppressive drugs, initial encounter: Secondary | ICD-10-CM | POA: Diagnosis present

## 2021-12-01 DIAGNOSIS — D696 Thrombocytopenia, unspecified: Secondary | ICD-10-CM

## 2021-12-01 DIAGNOSIS — D849 Immunodeficiency, unspecified: Secondary | ICD-10-CM | POA: Diagnosis present

## 2021-12-01 DIAGNOSIS — Z9012 Acquired absence of left breast and nipple: Secondary | ICD-10-CM

## 2021-12-01 DIAGNOSIS — R0489 Hemorrhage from other sites in respiratory passages: Secondary | ICD-10-CM | POA: Diagnosis not present

## 2021-12-01 DIAGNOSIS — R6521 Severe sepsis with septic shock: Secondary | ICD-10-CM | POA: Diagnosis not present

## 2021-12-01 DIAGNOSIS — J9601 Acute respiratory failure with hypoxia: Secondary | ICD-10-CM

## 2021-12-01 DIAGNOSIS — Z9911 Dependence on respirator [ventilator] status: Secondary | ICD-10-CM

## 2021-12-01 DIAGNOSIS — D61810 Antineoplastic chemotherapy induced pancytopenia: Secondary | ICD-10-CM | POA: Diagnosis present

## 2021-12-01 DIAGNOSIS — E875 Hyperkalemia: Secondary | ICD-10-CM | POA: Diagnosis present

## 2021-12-01 DIAGNOSIS — I480 Paroxysmal atrial fibrillation: Secondary | ICD-10-CM | POA: Diagnosis present

## 2021-12-01 DIAGNOSIS — J690 Pneumonitis due to inhalation of food and vomit: Secondary | ICD-10-CM

## 2021-12-01 DIAGNOSIS — F32A Depression, unspecified: Secondary | ICD-10-CM | POA: Diagnosis present

## 2021-12-01 DIAGNOSIS — Z79899 Other long term (current) drug therapy: Secondary | ICD-10-CM

## 2021-12-01 DIAGNOSIS — J188 Other pneumonia, unspecified organism: Secondary | ICD-10-CM | POA: Diagnosis present

## 2021-12-01 DIAGNOSIS — Z781 Physical restraint status: Secondary | ICD-10-CM

## 2021-12-01 DIAGNOSIS — E119 Type 2 diabetes mellitus without complications: Secondary | ICD-10-CM

## 2021-12-01 DIAGNOSIS — E440 Moderate protein-calorie malnutrition: Secondary | ICD-10-CM | POA: Diagnosis present

## 2021-12-01 LAB — COMPREHENSIVE METABOLIC PANEL
ALT: 10 U/L (ref 0–44)
AST: 15 U/L (ref 15–41)
Albumin: 2.2 g/dL — ABNORMAL LOW (ref 3.5–5.0)
Alkaline Phosphatase: 48 U/L (ref 38–126)
Anion gap: 9 (ref 5–15)
BUN: 12 mg/dL (ref 8–23)
CO2: 19 mmol/L — ABNORMAL LOW (ref 22–32)
Calcium: 7.9 mg/dL — ABNORMAL LOW (ref 8.9–10.3)
Chloride: 94 mmol/L — ABNORMAL LOW (ref 98–111)
Creatinine, Ser: 0.6 mg/dL (ref 0.44–1.00)
GFR, Estimated: >60 mL/min (ref >60)
Glucose, Bld: 335 mg/dL — ABNORMAL HIGH (ref 70–99)
Potassium: 4 mmol/L (ref 3.5–5.1)
Sodium: 122 mmol/L — ABNORMAL LOW (ref 135–145)
Total Bilirubin: 1 mg/dL (ref 0.3–1.2)
Total Protein: 7 g/dL (ref 6.5–8.1)

## 2021-12-01 LAB — PROTIME-INR
INR: 1.6 — ABNORMAL HIGH (ref 0.8–1.2)
Prothrombin Time: 19 seconds — ABNORMAL HIGH (ref 11.4–15.2)

## 2021-12-01 LAB — APTT: aPTT: 39 seconds — ABNORMAL HIGH (ref 24–36)

## 2021-12-01 MED ORDER — SODIUM CHLORIDE 0.9 % IV SOLN
2.0000 g | Freq: Once | INTRAVENOUS | Status: AC
  Administered 2021-12-01: 2 g via INTRAVENOUS
  Filled 2021-12-01: qty 12.5

## 2021-12-01 MED ORDER — SODIUM CHLORIDE 0.9 % IV SOLN
500.0000 mg | Freq: Once | INTRAVENOUS | Status: AC
  Administered 2021-12-01: 500 mg via INTRAVENOUS
  Filled 2021-12-01: qty 5

## 2021-12-01 MED ORDER — LACTATED RINGERS IV BOLUS (SEPSIS)
1000.0000 mL | Freq: Once | INTRAVENOUS | Status: AC
  Administered 2021-12-01: 1000 mL via INTRAVENOUS

## 2021-12-01 MED ORDER — LACTATED RINGERS IV BOLUS (SEPSIS)
250.0000 mL | Freq: Once | INTRAVENOUS | Status: AC
  Administered 2021-12-02: 250 mL via INTRAVENOUS

## 2021-12-01 MED ORDER — LACTATED RINGERS IV SOLN: INTRAVENOUS | Status: AC

## 2021-12-01 MED ORDER — LACTATED RINGERS IV BOLUS (SEPSIS)
500.0000 mL | Freq: Once | INTRAVENOUS | Status: AC
  Administered 2021-12-02: 500 mL via INTRAVENOUS

## 2021-12-01 NOTE — ED Triage Notes (Signed)
Pt arrives from home via [facility], per the patients daughter, , since yesterday has been having low grade temps, generalized weakness, cbg was 320. Glucose 476, has taken meds. Hot to touch. RR 36, hr 102, bp 138/76. Cap 30, 93%. Mastectomy on the left side. Last chemo on Friday. IV established in the right hand. 400 ns given.

## 2021-12-01 NOTE — ED Provider Notes (Incomplete)
[facility] DEPT Provider Note   CSN: [id number] Arrival date & time: 12/01/21  2223     History {Add pertinent medical, surgical, social history, OB history to HPI:1} No chief complaint on file.   Amanda Duarte is a 66 y.o. female.  Patient presents to the emergency department for evaluation of generalized weakness that started yesterday.  Today, patient has developed a fever.  She has not been eating well, blood sugars have been elevated.  Patient reports a history of breast cancer, currently being treated with chemotherapy.       Home Medications Prior to Admission medications   Medication Sig Start Date End Date Taking? Authorizing Provider  Accu-Chek Softclix Lancets lancets Use to check blood sugar 3 times daily. E11.65 10/28/21   Argentina Donovan, PA-C  atorvastatin (LIPITOR) 40 MG tablet Take 1 tablet (40 mg total) by mouth daily. 10/05/21   [name], NP  Blood Glucose Monitoring Suppl (ACCU-CHEK GUIDE) w/Device KIT use kit to check blood glucose three times daily 02/15/21   [name], NP  Blood Pressure Monitor DEVI Please provide patient with insurance approved blood pressure monitor 01/14/19   [name], NP  citalopram (CELEXA) 20 MG tablet Take 1 tablet (20 mg total) by mouth daily. For depression 10/05/21   [name], NP  fenofibrate (TRICOR) 145 MG tablet Take 1 tablet (145 mg total) by mouth daily. 10/05/21   [name], NP  glimepiride (AMARYL) 4 MG tablet Take 1 tablet (4 mg total) by mouth in the morning and at bedtime. 10/05/21   [name], NP  glucose blood (ACCU-CHEK GUIDE) test strip Use to check blood sugar 3 times daily. E11.65 10/28/21   Argentina Donovan, PA-C  hydrOXYzine (ATARAX) 10 MG tablet Take 1 tablet (10 mg total) by mouth 3 (three) times daily as needed for anxiety. 10/05/21   [name], NP  insulin glargine (LANTUS) 100 UNIT/ML Solostar Pen Inject 25 Units into the skin at  bedtime. 10/26/21   Eugenie Filler, MD  insulin lispro (HUMALOG) 100 UNIT/ML injection Inject 0.04 mLs (4 Units total) into the skin 3 (three) times daily before meals. 02/17/21   [name], NP  Insulin Pen Needle 31G X 5 MM MISC USE AS INSTRUCTED. INJECT INTO THE SKIN ONCE NIGHTLY. 02/15/21 02/15/22  [name], NP  Insulin Syringe-Needle U-100 30G X 5/16" 1 ML MISC Use as directed to inject into the skin 3 times daily. 02/17/21   [name], NP  losartan (COZAAR) 50 MG tablet Take 1 tablet (50 mg total) by mouth daily. 10/05/21   [name], NP  ondansetron (ZOFRAN) 8 MG tablet Take 1 tablet (8 mg total) by mouth every 8 (eight) hours as needed for nausea or vomiting. 11/10/21   [name], MD  pantoprazole (PROTONIX) 40 MG tablet Take 1 tablet (40 mg total) by mouth daily at 6 (six) AM. 10/27/21   Eugenie Filler, MD  potassium chloride SA (KLOR-CON M) 20 MEQ tablet Take 1 tablet (20 mEq total) by mouth daily. 10/05/21   [name], NP  prochlorperazine (COMPAZINE) 10 MG tablet Take 1 tablet (10 mg total) by mouth every 6 (six) hours as needed for nausea or vomiting. 11/10/21   [name], MD  sitaGLIPtin-metformin (JANUMET) 50-1000 MG tablet TAKE 1 TABLET BY MOUTH 2 TIMES DAILY 10/05/21   [name], NP      Allergies  Victoza [liraglutide]    Review of Systems   Review of Systems  Physical Exam Updated Vital Signs BP (!) 126/53   Pulse (!) 102   Temp (!) 103.3 F (39.6 C)   Resp (!) 30   SpO2 94%  Physical Exam Vitals and nursing note reviewed.  Constitutional:      General: She is not in acute distress.    Appearance: She is well-developed.  HENT:     Head: Normocephalic and atraumatic.     Mouth/Throat:     Mouth: Mucous membranes are moist.  Eyes:     General: Vision grossly intact. Gaze aligned appropriately.     Extraocular Movements: Extraocular movements intact.     Conjunctiva/sclera: Conjunctivae normal.  Cardiovascular:      Rate and Rhythm: Regular rhythm. Tachycardia present.     Pulses: Normal pulses.     Heart sounds: Normal heart sounds, S1 normal and S2 normal. No murmur heard.    No friction rub. No gallop.  Pulmonary:     Effort: Pulmonary effort is normal. Tachypnea present. No respiratory distress.     Breath sounds: Rhonchi present.  Abdominal:     General: Bowel sounds are normal.     Palpations: Abdomen is soft.     Tenderness: There is no abdominal tenderness. There is no guarding or rebound.     Hernia: No hernia is present.  Musculoskeletal:        General: No swelling.     Cervical back: Full passive range of motion without pain, normal range of motion and neck supple. No spinous process tenderness or muscular tenderness. Normal range of motion.     Right lower leg: No edema.     Left lower leg: No edema.  Skin:    General: Skin is warm and dry.     Capillary Refill: Capillary refill takes less than 2 seconds.     Findings: No ecchymosis, erythema, rash or wound.  Neurological:     General: No focal deficit present.     Mental Status: She is alert and oriented to person, place, and time.     GCS: GCS eye subscore is 4. GCS verbal subscore is 5. GCS motor subscore is 6.     Cranial Nerves: Cranial nerves 2-12 are intact.     Sensory: Sensation is intact.     Motor: Motor function is intact.     Coordination: Coordination is intact.  Psychiatric:        Attention and Perception: Attention normal.        Mood and Affect: Mood normal.     ED Results / Procedures / Treatments   Labs (all labs ordered are listed, but only abnormal results are displayed) Labs Reviewed  CULTURE, BLOOD (ROUTINE X 2)  CULTURE, BLOOD (ROUTINE X 2)  URINE CULTURE  RESP PANEL BY RT-PCR (FLU A&B, COVID) ARPGX2  LACTIC ACID, PLASMA  LACTIC ACID, PLASMA  COMPREHENSIVE METABOLIC PANEL  CBC WITH DIFFERENTIAL/PLATELET  PROTIME-INR  APTT  URINALYSIS, ROUTINE W REFLEX MICROSCOPIC     EKG None  Radiology DG Chest Port 1 View  Result Date: 12/01/2021 CLINICAL DATA:  Possible sepsis low-grade fever EXAM: PORTABLE CHEST 1 VIEW COMPARISON:  CT 10/21/2021 FINDINGS: Airspace disease at the left lung base. No pleural effusion. Mild cardiomegaly. No pneumothorax. Clips in the left axilla IMPRESSION: Airspace disease at the left lung base concerning for pneumonia. Cardiomegaly. Electronically Signed   By: Donavan Foil M.D.   On: 12/01/2021 23:01  Procedures Procedures  {Document cardiac monitor, telemetry assessment procedure when appropriate:1}  Medications Ordered in ED Medications  lactated ringers infusion (has no administration in time range)  lactated ringers bolus 1,000 mL (has no administration in time range)    And  lactated ringers bolus 500 mL (has no administration in time range)    And  lactated ringers bolus 250 mL (has no administration in time range)  ceFEPIme (MAXIPIME) 2 g in sodium chloride 0.9 % 100 mL IVPB (has no administration in time range)  azithromycin (ZITHROMAX) 500 mg in sodium chloride 0.9 % 250 mL IVPB (has no administration in time range)    ED Course/ Medical Decision Making/ A&P                           Medical Decision Making Risk Prescription drug management.   ***  {Document critical care time when appropriate:1} {Document review of labs and clinical decision tools ie heart score, Chads2Vasc2 etc:1}  {Document your independent review of radiology images, and any outside records:1} {Document your discussion with family members, caretakers, and with consultants:1} {Document social determinants of health affecting pt's care:1} {Document your decision making why or why not admission, treatments were needed:1} Final Clinical Impression(s) / ED Diagnoses Final diagnoses:  None    Rx / DC Orders ED Discharge Orders     None

## 2021-12-01 NOTE — Sepsis Progress Note (Addendum)
Elink monitoring for the code sepsis protocol.  Messaged bedside RN requesting current weight be placed in Epic.

## 2021-12-01 NOTE — ED Provider Triage Note (Addendum)
Emergency Medicine Provider Triage Evaluation Note  Amanda Duarte , a 66 y.o. female  was evaluated in triage.  Pt complains of On chemo. Last chemo on Firday. Yesterday generalized weakness and and developed a fever and progressed into today. Sugar 320 today. Decreased PO intake. Patient did take her insulin. With EMS, CBG was 476. Breast cancer.  Denies abdominal pain, nausea, vomiting, diarrhea, cough, dysuria. Reports some chills. No recorded fevers at home.   BP 136/78 HR 106 RR 30  NS 4100m  Review of Systems  Positive:  Negative:   Physical Exam  There were no vitals taken for this visit. Gen:   Awake, no distress   Resp:  Normal effort  MSK:   Moves extremities without difficulty  Other:    Medical Decision Making  Medically screening exam initiated at 10:45 PM.  Appropriate orders placed.  Amanda COENwas informed that the remainder of the evaluation will be completed by another provider, this initial triage assessment does not replace that evaluation, and the importance of remaining in the ED until their evaluation is complete.  Patient febrile to 103.1F. Evolving sepsis orders placed. Patient is being roomed to [facility].   Amanda Puller PA-C 12/01/21 2301    Amanda Puller PA-C 12/01/21 2302

## 2021-12-02 ENCOUNTER — Emergency Department (HOSPITAL_COMMUNITY): Payer: Medicare Other

## 2021-12-02 ENCOUNTER — Inpatient Hospital Stay (HOSPITAL_COMMUNITY): Payer: Medicare Other

## 2021-12-02 DIAGNOSIS — Y929 Unspecified place or not applicable: Secondary | ICD-10-CM | POA: Diagnosis not present

## 2021-12-02 DIAGNOSIS — I472 Ventricular tachycardia, unspecified: Secondary | ICD-10-CM | POA: Diagnosis not present

## 2021-12-02 DIAGNOSIS — J80 Acute respiratory distress syndrome: Secondary | ICD-10-CM | POA: Diagnosis not present

## 2021-12-02 DIAGNOSIS — D61810 Antineoplastic chemotherapy induced pancytopenia: Secondary | ICD-10-CM | POA: Diagnosis not present

## 2021-12-02 DIAGNOSIS — E440 Moderate protein-calorie malnutrition: Secondary | ICD-10-CM | POA: Diagnosis not present

## 2021-12-02 DIAGNOSIS — Z66 Do not resuscitate: Secondary | ICD-10-CM | POA: Diagnosis not present

## 2021-12-02 DIAGNOSIS — D469 Myelodysplastic syndrome, unspecified: Secondary | ICD-10-CM | POA: Diagnosis not present

## 2021-12-02 DIAGNOSIS — I10 Essential (primary) hypertension: Secondary | ICD-10-CM | POA: Diagnosis present

## 2021-12-02 DIAGNOSIS — I4721 Torsades de pointes: Secondary | ICD-10-CM | POA: Diagnosis not present

## 2021-12-02 DIAGNOSIS — J189 Pneumonia, unspecified organism: Secondary | ICD-10-CM | POA: Diagnosis not present

## 2021-12-02 DIAGNOSIS — Z515 Encounter for palliative care: Secondary | ICD-10-CM | POA: Diagnosis not present

## 2021-12-02 DIAGNOSIS — E1165 Type 2 diabetes mellitus with hyperglycemia: Secondary | ICD-10-CM | POA: Diagnosis present

## 2021-12-02 DIAGNOSIS — R6520 Severe sepsis without septic shock: Secondary | ICD-10-CM | POA: Diagnosis not present

## 2021-12-02 DIAGNOSIS — Z9911 Dependence on respirator [ventilator] status: Secondary | ICD-10-CM | POA: Diagnosis not present

## 2021-12-02 DIAGNOSIS — R6521 Severe sepsis with septic shock: Secondary | ICD-10-CM | POA: Diagnosis not present

## 2021-12-02 DIAGNOSIS — R531 Weakness: Secondary | ICD-10-CM | POA: Diagnosis not present

## 2021-12-02 DIAGNOSIS — J9601 Acute respiratory failure with hypoxia: Secondary | ICD-10-CM | POA: Diagnosis not present

## 2021-12-02 DIAGNOSIS — J181 Lobar pneumonia, unspecified organism: Secondary | ICD-10-CM | POA: Diagnosis not present

## 2021-12-02 DIAGNOSIS — R509 Fever, unspecified: Secondary | ICD-10-CM | POA: Diagnosis not present

## 2021-12-02 DIAGNOSIS — E119 Type 2 diabetes mellitus without complications: Secondary | ICD-10-CM | POA: Diagnosis not present

## 2021-12-02 DIAGNOSIS — I480 Paroxysmal atrial fibrillation: Secondary | ICD-10-CM | POA: Diagnosis present

## 2021-12-02 DIAGNOSIS — Z794 Long term (current) use of insulin: Secondary | ICD-10-CM | POA: Diagnosis not present

## 2021-12-02 DIAGNOSIS — Z7189 Other specified counseling: Secondary | ICD-10-CM | POA: Diagnosis not present

## 2021-12-02 DIAGNOSIS — D696 Thrombocytopenia, unspecified: Secondary | ICD-10-CM | POA: Diagnosis not present

## 2021-12-02 DIAGNOSIS — A419 Sepsis, unspecified organism: Secondary | ICD-10-CM | POA: Diagnosis not present

## 2021-12-02 DIAGNOSIS — Y92239 Unspecified place in hospital as the place of occurrence of the external cause: Secondary | ICD-10-CM | POA: Diagnosis not present

## 2021-12-02 DIAGNOSIS — Z170 Estrogen receptor positive status [ER+]: Secondary | ICD-10-CM | POA: Diagnosis not present

## 2021-12-02 DIAGNOSIS — K219 Gastro-esophageal reflux disease without esophagitis: Secondary | ICD-10-CM | POA: Diagnosis present

## 2021-12-02 DIAGNOSIS — D709 Neutropenia, unspecified: Secondary | ICD-10-CM | POA: Diagnosis not present

## 2021-12-02 DIAGNOSIS — R5081 Fever presenting with conditions classified elsewhere: Secondary | ICD-10-CM | POA: Diagnosis not present

## 2021-12-02 DIAGNOSIS — E1169 Type 2 diabetes mellitus with other specified complication: Secondary | ICD-10-CM | POA: Diagnosis present

## 2021-12-02 DIAGNOSIS — Z1152 Encounter for screening for COVID-19: Secondary | ICD-10-CM | POA: Diagnosis not present

## 2021-12-02 DIAGNOSIS — D849 Immunodeficiency, unspecified: Secondary | ICD-10-CM | POA: Diagnosis not present

## 2021-12-02 DIAGNOSIS — F32A Depression, unspecified: Secondary | ICD-10-CM | POA: Diagnosis present

## 2021-12-02 DIAGNOSIS — D46Z Other myelodysplastic syndromes: Secondary | ICD-10-CM | POA: Diagnosis present

## 2021-12-02 DIAGNOSIS — E871 Hypo-osmolality and hyponatremia: Secondary | ICD-10-CM | POA: Diagnosis present

## 2021-12-02 DIAGNOSIS — C50212 Malignant neoplasm of upper-inner quadrant of left female breast: Secondary | ICD-10-CM | POA: Diagnosis not present

## 2021-12-02 DIAGNOSIS — E11649 Type 2 diabetes mellitus with hypoglycemia without coma: Secondary | ICD-10-CM | POA: Diagnosis not present

## 2021-12-02 DIAGNOSIS — J690 Pneumonitis due to inhalation of food and vomit: Secondary | ICD-10-CM | POA: Diagnosis not present

## 2021-12-02 DIAGNOSIS — R0489 Hemorrhage from other sites in respiratory passages: Secondary | ICD-10-CM | POA: Diagnosis not present

## 2021-12-02 DIAGNOSIS — D61818 Other pancytopenia: Secondary | ICD-10-CM | POA: Diagnosis not present

## 2021-12-02 DIAGNOSIS — R579 Shock, unspecified: Secondary | ICD-10-CM | POA: Diagnosis not present

## 2021-12-02 LAB — CBC WITH DIFFERENTIAL/PLATELET
Abs Immature Granulocytes: 0.15 10*3/uL — ABNORMAL HIGH (ref 0.00–0.07)
Abs Immature Granulocytes: 0.15 10*3/uL — ABNORMAL HIGH (ref 0.00–0.07)
Basophils Absolute: 0 10*3/uL (ref 0.0–0.1)
Basophils Absolute: 0 10*3/uL (ref 0.0–0.1)
Basophils Relative: 1 %
Basophils Relative: 2 %
Eosinophils Absolute: 0 10*3/uL (ref 0.0–0.5)
Eosinophils Absolute: 0 10*3/uL (ref 0.0–0.5)
Eosinophils Relative: 0 %
Eosinophils Relative: 0 %
HCT: 20.1 % — ABNORMAL LOW (ref 36.0–46.0)
HCT: 24.4 % — ABNORMAL LOW (ref 36.0–46.0)
Hemoglobin: 6.7 g/dL — CL (ref 12.0–15.0)
Hemoglobin: 8.6 g/dL — ABNORMAL LOW (ref 12.0–15.0)
Immature Granulocytes: 8 %
Immature Granulocytes: 8 %
Lymphocytes Relative: 47 %
Lymphocytes Relative: 42 %
Lymphs Abs: 0.9 10*3/uL (ref 0.7–4.0)
Lymphs Abs: 0.8 10*3/uL (ref 0.7–4.0)
MCH: 30 pg (ref 26.0–34.0)
MCH: 30.7 pg (ref 26.0–34.0)
MCHC: 33.3 g/dL (ref 30.0–36.0)
MCHC: 35.2 g/dL (ref 30.0–36.0)
MCV: 90.1 fL (ref 80.0–100.0)
MCV: 87.1 fL (ref 80.0–100.0)
Monocytes Absolute: 0.1 10*3/uL (ref 0.1–1.0)
Monocytes Absolute: 0 10*3/uL — ABNORMAL LOW (ref 0.1–1.0)
Monocytes Relative: 3 %
Monocytes Relative: 2 %
Neutro Abs: 0.8 10*3/uL — ABNORMAL LOW (ref 1.7–7.7)
Neutro Abs: 0.9 10*3/uL — ABNORMAL LOW (ref 1.7–7.7)
Neutrophils Relative %: 41 %
Neutrophils Relative %: 46 %
Platelets: 15 10*3/uL — CL (ref 150–400)
Platelets: 12 10*3/uL — CL (ref 150–400)
RBC: 2.23 MIL/uL — ABNORMAL LOW (ref 3.87–5.11)
RBC: 2.8 MIL/uL — ABNORMAL LOW (ref 3.87–5.11)
RDW: 14.9 % (ref 11.5–15.5)
RDW: 14.6 % (ref 11.5–15.5)
WBC: 1.8 10*3/uL — ABNORMAL LOW (ref 4.0–10.5)
WBC: 1.9 10*3/uL — ABNORMAL LOW (ref 4.0–10.5)
nRBC: 0 % (ref 0.0–0.2)
nRBC: 0 % (ref 0.0–0.2)

## 2021-12-02 LAB — URINALYSIS, ROUTINE W REFLEX MICROSCOPIC
Bacteria, UA: NONE SEEN
Bilirubin Urine: NEGATIVE
Glucose, UA: >=500 mg/dL — AB
Hgb urine dipstick: NEGATIVE
Ketones, ur: NEGATIVE mg/dL
Leukocytes,Ua: NEGATIVE
Nitrite: NEGATIVE
Protein, ur: NEGATIVE mg/dL
Specific Gravity, Urine: 1.01 (ref 1.005–1.030)
pH: 5 (ref 5.0–8.0)

## 2021-12-02 LAB — RESPIRATORY PANEL BY PCR

## 2021-12-02 LAB — RENAL FUNCTION PANEL
Albumin: 2.3 g/dL — ABNORMAL LOW (ref 3.5–5.0)
Anion gap: 7 (ref 5–15)
BUN: 12 mg/dL (ref 8–23)
CO2: 19 mmol/L — ABNORMAL LOW (ref 22–32)
Calcium: 8 mg/dL — ABNORMAL LOW (ref 8.9–10.3)
Chloride: 106 mmol/L (ref 98–111)
Creatinine, Ser: 0.53 mg/dL (ref 0.44–1.00)
GFR, Estimated: >60 mL/min (ref >60)
Glucose, Bld: 229 mg/dL — ABNORMAL HIGH (ref 70–99)
Phosphorus: 1.9 mg/dL — ABNORMAL LOW (ref 2.5–4.6)
Potassium: 3.8 mmol/L (ref 3.5–5.1)
Sodium: 132 mmol/L — ABNORMAL LOW (ref 135–145)

## 2021-12-02 LAB — STREP PNEUMONIAE URINARY ANTIGEN: Strep Pneumo Urinary Antigen: NEGATIVE

## 2021-12-02 LAB — COMPREHENSIVE METABOLIC PANEL
ALT: 10 U/L (ref 0–44)
AST: 14 U/L — ABNORMAL LOW (ref 15–41)
Albumin: 2.3 g/dL — ABNORMAL LOW (ref 3.5–5.0)
Alkaline Phosphatase: 48 U/L (ref 38–126)
Anion gap: 6 (ref 5–15)
BUN: 12 mg/dL (ref 8–23)
CO2: 23 mmol/L (ref 22–32)
Calcium: 8.4 mg/dL — ABNORMAL LOW (ref 8.9–10.3)
Chloride: 98 mmol/L (ref 98–111)
Creatinine, Ser: 0.6 mg/dL (ref 0.44–1.00)
GFR, Estimated: >60 mL/min (ref >60)
Glucose, Bld: 291 mg/dL — ABNORMAL HIGH (ref 70–99)
Potassium: 3 mmol/L — ABNORMAL LOW (ref 3.5–5.1)
Sodium: 127 mmol/L — ABNORMAL LOW (ref 135–145)
Total Bilirubin: 1.6 mg/dL — ABNORMAL HIGH (ref 0.3–1.2)
Total Protein: 7.1 g/dL (ref 6.5–8.1)

## 2021-12-02 LAB — RESP PANEL BY RT-PCR (FLU A&B, COVID) ARPGX2
Influenza A by PCR: NEGATIVE
Influenza B by PCR: NEGATIVE
SARS Coronavirus 2 by RT PCR: NEGATIVE

## 2021-12-02 LAB — SODIUM, URINE, RANDOM: Sodium, Ur: 12 mmol/L

## 2021-12-02 LAB — GLUCOSE, CAPILLARY
Glucose-Capillary: 230 mg/dL — ABNORMAL HIGH (ref 70–99)
Glucose-Capillary: 235 mg/dL — ABNORMAL HIGH (ref 70–99)

## 2021-12-02 LAB — LACTIC ACID, PLASMA
Lactic Acid, Venous: 8.2 mmol/L (ref 0.5–1.9)
Lactic Acid, Venous: 1.8 mmol/L (ref 0.5–1.9)
Lactic Acid, Venous: 2.2 mmol/L (ref 0.5–1.9)

## 2021-12-02 LAB — CBG MONITORING, ED: Glucose-Capillary: 236 mg/dL — ABNORMAL HIGH (ref 70–99)

## 2021-12-02 LAB — C-REACTIVE PROTEIN: CRP: 21.2 mg/dL — ABNORMAL HIGH (ref <1.0)

## 2021-12-02 LAB — OSMOLALITY, URINE: Osmolality, Ur: 315 mOsm/kg (ref 300–900)

## 2021-12-02 LAB — MAGNESIUM: Magnesium: 1.3 mg/dL — ABNORMAL LOW (ref 1.7–2.4)

## 2021-12-02 LAB — MRSA NEXT GEN BY PCR, NASAL: MRSA by PCR Next Gen: NOT DETECTED

## 2021-12-02 LAB — PREPARE RBC (CROSSMATCH)

## 2021-12-02 MED ORDER — SODIUM CHLORIDE 0.9 % IV BOLUS
1000.0000 mL | Freq: Once | INTRAVENOUS | Status: AC
  Administered 2021-12-02: 1000 mL via INTRAVENOUS

## 2021-12-02 MED ORDER — ONDANSETRON HCL 4 MG/2ML IJ SOLN: 4.0000 mg | Freq: Four times a day (QID) | INTRAMUSCULAR | Status: DC | PRN

## 2021-12-02 MED ORDER — VANCOMYCIN HCL IN DEXTROSE 1-5 GM/200ML-% IV SOLN
1000.0000 mg | Freq: Once | INTRAVENOUS | Status: AC
  Administered 2021-12-02: 1000 mg via INTRAVENOUS
  Filled 2021-12-02: qty 200

## 2021-12-02 MED ORDER — ALBUTEROL SULFATE (2.5 MG/3ML) 0.083% IN NEBU
2.5000 mg | INHALATION_SOLUTION | RESPIRATORY_TRACT | Status: DC | PRN
  Administered 2021-12-02 – 2021-12-09 (×4): 2.5 mg via RESPIRATORY_TRACT
  Filled 2021-12-02 (×4): qty 3

## 2021-12-02 MED ORDER — INSULIN ASPART 100 UNIT/ML IJ SOLN
0.0000 [IU] | Freq: Every day | INTRAMUSCULAR | Status: DC
  Administered 2021-12-02 – 2021-12-06 (×2): 2 [IU] via SUBCUTANEOUS
  Filled 2021-12-02: qty 0.05

## 2021-12-02 MED ORDER — CHLORHEXIDINE GLUCONATE CLOTH 2 % EX PADS
6.0000 | MEDICATED_PAD | Freq: Every day | CUTANEOUS | Status: DC
  Administered 2021-12-02 – 2021-12-20 (×19): 6 via TOPICAL

## 2021-12-02 MED ORDER — ORAL CARE MOUTH RINSE: 15.0000 mL | OROMUCOSAL | Status: DC | PRN

## 2021-12-02 MED ORDER — FUROSEMIDE 10 MG/ML IJ SOLN
40.0000 mg | Freq: Once | INTRAMUSCULAR | Status: AC
  Administered 2021-12-02: 40 mg via INTRAVENOUS
  Filled 2021-12-02: qty 4

## 2021-12-02 MED ORDER — ACETAMINOPHEN 500 MG PO TABS: 1000.0000 mg | ORAL_TABLET | Freq: Once | ORAL | Status: AC

## 2021-12-02 MED ORDER — SODIUM CHLORIDE 0.9 % IV SOLN
10.0000 mL/h | Freq: Once | INTRAVENOUS | Status: AC
  Administered 2021-12-02: 10 mL/h via INTRAVENOUS

## 2021-12-02 MED ORDER — SODIUM CHLORIDE 0.9% IV SOLUTION: Freq: Once | INTRAVENOUS | Status: AC

## 2021-12-02 MED ORDER — INSULIN ASPART 100 UNIT/ML IJ SOLN
0.0000 [IU] | Freq: Three times a day (TID) | INTRAMUSCULAR | Status: DC
  Administered 2021-12-02 (×2): 5 [IU] via SUBCUTANEOUS
  Administered 2021-12-03 (×2): 8 [IU] via SUBCUTANEOUS
  Administered 2021-12-04: 5 [IU] via SUBCUTANEOUS
  Administered 2021-12-04: 8 [IU] via SUBCUTANEOUS
  Administered 2021-12-05: 2 [IU] via SUBCUTANEOUS
  Administered 2021-12-06: 8 [IU] via SUBCUTANEOUS
  Filled 2021-12-02: qty 0.15

## 2021-12-02 MED ORDER — ACETAMINOPHEN 500 MG PO TABS
ORAL_TABLET | ORAL | Status: AC
  Administered 2021-12-02: 1000 mg via ORAL
  Filled 2021-12-02: qty 2

## 2021-12-02 MED ORDER — ONDANSETRON HCL 4 MG PO TABS: 4.0000 mg | ORAL_TABLET | Freq: Four times a day (QID) | ORAL | Status: DC | PRN

## 2021-12-02 MED ORDER — MAGNESIUM SULFATE 4 GM/100ML IV SOLN
4.0000 g | Freq: Once | INTRAVENOUS | Status: AC
  Administered 2021-12-02: 4 g via INTRAVENOUS
  Filled 2021-12-02: qty 100

## 2021-12-02 MED ORDER — LACTATED RINGERS IV BOLUS
1000.0000 mL | Freq: Once | INTRAVENOUS | Status: AC
  Administered 2021-12-02: 1000 mL via INTRAVENOUS

## 2021-12-02 MED ORDER — METRONIDAZOLE 500 MG/100ML IV SOLN
500.0000 mg | Freq: Two times a day (BID) | INTRAVENOUS | Status: DC
  Administered 2021-12-02 – 2021-12-05 (×7): 500 mg via INTRAVENOUS
  Filled 2021-12-02 (×7): qty 100

## 2021-12-02 MED ORDER — POTASSIUM CHLORIDE 10 MEQ/100ML IV SOLN
10.0000 meq | INTRAVENOUS | Status: AC
  Administered 2021-12-02 (×6): 10 meq via INTRAVENOUS
  Filled 2021-12-02 (×6): qty 100

## 2021-12-02 MED ORDER — ALBUMIN HUMAN 25 % IV SOLN
25.0000 g | Freq: Four times a day (QID) | INTRAVENOUS | Status: DC
  Filled 2021-12-02: qty 100

## 2021-12-02 MED ORDER — SODIUM CHLORIDE 0.9 % IV SOLN
2.0000 g | Freq: Two times a day (BID) | INTRAVENOUS | Status: DC
  Administered 2021-12-02 – 2021-12-05 (×7): 2 g via INTRAVENOUS
  Filled 2021-12-02 (×7): qty 12.5

## 2021-12-02 MED ORDER — ACETAMINOPHEN 650 MG RE SUPP
650.0000 mg | Freq: Four times a day (QID) | RECTAL | Status: DC | PRN
  Administered 2021-12-06 (×2): 650 mg via RECTAL
  Filled 2021-12-02 (×2): qty 1

## 2021-12-02 MED ORDER — ACETAMINOPHEN 325 MG PO TABS
650.0000 mg | ORAL_TABLET | Freq: Four times a day (QID) | ORAL | Status: DC | PRN
  Administered 2021-12-02 – 2021-12-06 (×9): 650 mg via ORAL
  Filled 2021-12-02 (×10): qty 2

## 2021-12-02 MED ORDER — PANTOPRAZOLE SODIUM 40 MG IV SOLR
40.0000 mg | Freq: Every day | INTRAVENOUS | Status: DC
  Administered 2021-12-02 – 2021-12-03 (×2): 40 mg via INTRAVENOUS
  Filled 2021-12-02 (×2): qty 10

## 2021-12-02 MED ORDER — POTASSIUM PHOSPHATES 15 MMOLE/5ML IV SOLN
15.0000 mmol | Freq: Once | INTRAVENOUS | Status: AC
  Administered 2021-12-02: 15 mmol via INTRAVENOUS
  Filled 2021-12-02: qty 5

## 2021-12-02 MED ORDER — VANCOMYCIN HCL 750 MG/150ML IV SOLN
750.0000 mg | INTRAVENOUS | Status: DC
  Administered 2021-12-03 – 2021-12-04 (×2): 750 mg via INTRAVENOUS
  Filled 2021-12-02 (×3): qty 150

## 2021-12-02 MED ORDER — ACETAMINOPHEN 325 MG PO TABS
650.0000 mg | ORAL_TABLET | Freq: Once | ORAL | Status: AC
  Administered 2021-12-02: 650 mg via ORAL
  Filled 2021-12-02: qty 2

## 2021-12-02 NOTE — TOC Initial Note (Signed)
Transition of Care [facility]) - Initial/Assessment Note    Patient Details  Name: Amanda Duarte MRN: [id number] Date of Birth: 1955-05-05  [facility]) CM/SW Contact:    Dessa Phi, RN Phone Number: 12/02/2021, 2:10 PM  Clinical Narrative:  Monitor for d/c plans.                 Expected Discharge Plan: Home/Self Care Barriers to Discharge: Continued Medical Work up   Patient Goals and CMS Choice        Expected Discharge Plan and Services Expected Discharge Plan: Home/Self Care                                              Prior Living Arrangements/Services                       Activities of Daily Living      Permission Sought/Granted                  Emotional Assessment              Admission diagnosis:  Sepsis ([place]) [A41.9] Sepsis with acute organ dysfunction without septic shock, due to unspecified organism, unspecified organ dysfunction type ([place]) [A41.9, R65.20] Patient Active Problem List   Diagnosis Date Noted   Sepsis due to pneumonia ([place]) 12/02/2021   GERD (gastroesophageal reflux disease) 12/02/2021   MDS (myelodysplastic syndrome), high grade ([place]) 11/19/2021   Pancytopenia ([place]) 10/21/2021   Thrombocytopenia ([place]) 10/21/2021   Hyponatremia 10/21/2021   Hypokalemia 10/21/2021   Anxiety 10/21/2021   Depression 10/21/2021   Axillary mass 04/16/2021   Abdominal pain 11/20/2019   Vaginal atrophy 11/20/2019   Acute vulvitis 11/20/2019   Current moderate episode of major depressive disorder without prior episode ([place]) 02/08/2019   HLD (hyperlipidemia) 06/27/2018   Port-A-Cath in place 06/09/2017   Malignant neoplasm of upper-inner quadrant of left breast in female, estrogen receptor positive ([place]) 05/22/2017   Closed fracture of left tibial plateau    Axillary lymphadenopathy 04/12/2017   Closed fracture of lateral portion of left tibial plateau 04/11/2017   Chest pain, neg MI, neg stress Nuc study, possible  GI 05/19/2014   Essential hypertension 05/19/2014   DM2 (diabetes mellitus, type 2) ([place]) 05/19/2014   PCP:  [name], NP Pharmacy:   [facility], [place] [address] Phone: [phone] Fax: [phone]     Social Determinants of Health (SDOH) Interventions    Readmission Risk Interventions     No data to display

## 2021-12-02 NOTE — Progress Notes (Addendum)
[facility] Progress Note Patient Name: Amanda Duarte DOB: [birth year] MRN: [id number]   Date of Service  12/02/2021  HPI/Events of Note  Mutiple issues: 1. Review of CXR reveals persistent moderate severity left upper lobe and left lower lobe atelectasis and/or infiltrate. Current Abx Rx with Vancomycin, Cefepime and Azithromycin. 2. Hypophosphatemia - PO4--- = 1.9, K+ = 3.8 and Creatinine = 0.53.  eICU Interventions  Plan: Continue present respiratory management.  Will replace PO4---.     Intervention Category Major Interventions: Other:  Lysle Dingwall 12/02/2021, 8:23 PM

## 2021-12-02 NOTE — ED Notes (Signed)
Lab called to add on to urine specimen.

## 2021-12-02 NOTE — Progress Notes (Signed)
Pharmacy Antibiotic Note  Amanda Duarte is a 66 y.o. female admitted on 12/01/2021 with pneumonia.  Pharmacy has been consulted for vanc/cefepime dosing.  Plan: Vanc 1g IV x 1 already given. Start vanc '750mg'$  IV q24 thereafter. Goal AUC 400-550 Cefepime 2g IV q12 per current renal function Follow MRSA PCR, d/c vanc if negative     Temp (24hrs), Avg:100.9 F (38.3 C), Min:98.8 F (37.1 C), Max:103.3 F (39.6 C)  Recent Labs  Lab 11/26/21 1252 12/01/21 2313 12/02/21 0200 12/02/21 0751  WBC 2.2* 1.8*  --   --   CREATININE 0.56 0.60  --   --   LATICACIDVEN  --  8.2* 1.8 2.2*    Estimated Creatinine Clearance: 49.7 mL/min (by C-G formula based on SCr of 0.6 mg/dL).    Allergies  Allergen Reactions   Victoza [Liraglutide] Nausea And Vomiting     Thank you for allowing pharmacy to be a part of this patient's care.  [name] 12/02/2021 9:18 AM

## 2021-12-02 NOTE — Progress Notes (Signed)
Amanda Duarte   DOB:04/10/1955   UK#:[id number]   BJS#:[id number]  Hem/onc follow up   Subjective: Patient is well-known to me, under my care for her recently diagnosed MDS.  She received first cycle chemotherapy Vidaza 10/23-10/27.  She presented with confusion, fever, chills, was admitted for pneumonia and sepsis.  Patient is feeling better today, I spoke with her daughter at bedside today.   Objective:  Vitals:   12/02/21 1800 12/02/21 1900  BP: (!) 105/53 (!) 168/65  Pulse: 88 (!) 133  Resp: (!) 30 (!) 27  Temp:    SpO2: 97% 94%    Body mass index is 22.69 kg/m.  Intake/Output Summary (Last 24 hours) at 12/02/2021 1952 Last data filed at 12/02/2021 1944 Gross per 24 hour  Intake 4509.27 ml  Output --  Net 4509.27 ml     Sclerae unicteric  Abdomen benign  MSK no focal spinal tenderness, no peripheral edema  Neuro nonfocal    CBG (last 3)  Recent Labs    12/02/21 1129 12/02/21 1618  GLUCAP 236* 230*     Labs:  Urine Studies No results for input(s): "UHGB", "CRYS" in the last 72 hours.  Invalid input(s): "UACOL", "UAPR", "USPG", "UPH", "UTP", "UGL", "UKET", "UBIL", "UNIT", "UROB", "ULEU", "UEPI", "UWBC", "URBC", "UBAC", "CAST", "UCOM", "BILUA"  Basic Metabolic Panel: Recent Labs  Lab 11/26/21 1252 12/01/21 2313 12/02/21 1100  NA 129* 122* 127*  K 3.7 4.0 3.0*  CL 95* 94* 98  CO2 27 19* 23  GLUCOSE 284* 335* 291*  BUN '12 12 12  '$ CREATININE 0.56 0.60 0.60  CALCIUM 9.2 7.9* 8.4*  MG  --   --  1.3*   GFR Estimated Creatinine Clearance: 49.7 mL/min (by C-G formula based on SCr of 0.6 mg/dL). Liver Function Tests: Recent Labs  Lab 11/26/21 1252 12/01/21 2313 12/02/21 1100  AST 11* 15 14*  ALT '5 10 10  '$ ALKPHOS 84 48 48  BILITOT 0.6 1.0 1.6*  PROT 9.1* 7.0 7.1  ALBUMIN 3.5 2.2* 2.3*   No results for input(s): "LIPASE", "AMYLASE" in the last 168 hours. No results for input(s): "AMMONIA" in the last 168 hours. Coagulation profile Recent Labs  Lab  12/01/21 2313  INR 1.6*    CBC: Recent Labs  Lab 11/26/21 1252 12/01/21 2313 12/02/21 1100  WBC 2.2* 1.8* 1.9*  NEUTROABS 0.6* 0.8* 0.9*  HGB 9.9* 6.7* 8.6*  HCT 28.1* 20.1* 24.4*  MCV 88.1 90.1 87.1  PLT 13* 15* 12*   Cardiac Enzymes: No results for input(s): "CKTOTAL", "CKMB", "CKMBINDEX", "TROPONINI" in the last 168 hours. BNP: Invalid input(s): "POCBNP" CBG: Recent Labs  Lab 12/02/21 1129 12/02/21 1618  GLUCAP 236* 230*   D-Dimer No results for input(s): "DDIMER" in the last 72 hours. Hgb A1c No results for input(s): "HGBA1C" in the last 72 hours. Lipid Profile No results for input(s): "CHOL", "HDL", "LDLCALC", "TRIG", "CHOLHDL", "LDLDIRECT" in the last 72 hours. Thyroid function studies No results for input(s): "TSH", "T4TOTAL", "T3FREE", "THYROIDAB" in the last 72 hours.  Invalid input(s): "FREET3" Anemia work up No results for input(s): "VITAMINB12", "FOLATE", "FERRITIN", "TIBC", "IRON", "RETICCTPCT" in the last 72 hours. Microbiology Recent Results (from the past 240 hour(s))  Blood Culture (routine x 2)     Status: None (Preliminary result)   Collection Time: 12/01/21 11:13 PM   Specimen: BLOOD  Result Value Ref Range Status   Specimen Description   Final    BLOOD RIGHT ANTECUBITAL Performed at [facility]  [address] [place], [place] [address]    Special Requests   Final    BOTTLES DRAWN AEROBIC AND ANAEROBIC Blood Culture results may not be optimal due to an excessive volume of blood received in culture bottles Performed at [facility] [address], [place], [place] [address]    Culture   Final    NO GROWTH < 12 HOURS Performed at [facility] [address], [place], [place] [address]    Report Status PENDING  Incomplete  Blood Culture (routine x 2)     Status: None (Preliminary result)   Collection Time: 12/01/21 11:13 PM   Specimen: BLOOD  Result Value Ref Range Status   Specimen  Description   Final    BLOOD SITE NOT SPECIFIED Performed at [facility] [address], [place], [place] [address]    Special Requests   Final    NONE Performed at [facility], [place] [address], [place], [place] [address]    Culture   Final    NO GROWTH < 12 HOURS Performed at [facility] [address], [place], [place] [address]    Report Status PENDING  Incomplete  Resp Panel by RT-PCR (Flu A&B, Covid) Anterior Nasal Swab     Status: None   Collection Time: 12/01/21 11:25 PM   Specimen: Anterior Nasal Swab  Result Value Ref Range Status   SARS Coronavirus 2 by RT PCR NEGATIVE NEGATIVE Final    Comment: (NOTE) SARS-CoV-2 target nucleic acids are NOT DETECTED.  The SARS-CoV-2 RNA is generally detectable in upper respiratory specimens during the acute phase of infection. The lowest concentration of SARS-CoV-2 viral copies this assay can detect is 138 copies/mL. A negative result does not preclude SARS-Cov-2 infection and should not be used as the sole basis for treatment or other patient management decisions. A negative result may occur with  improper specimen collection/handling, submission of specimen other than nasopharyngeal swab, presence of viral mutation(s) within the areas targeted by this assay, and inadequate number of viral copies(<138 copies/mL). A negative result must be combined with clinical observations, patient history, and epidemiological information. The expected result is Negative.  Fact Sheet for Patients:  EntrepreneurPulse.com.au  Fact Sheet for Healthcare Providers:  IncredibleEmployment.be  This test is no t yet approved or cleared by the [place] FDA and  has been authorized for detection and/or diagnosis of SARS-CoV-2 by FDA under an Emergency Use Authorization (EUA). This EUA will remain  in effect (meaning this test can be used) for the duration of  the COVID-19 declaration under Section 564(b)(1) of the Act, 21 U.S.C.section 360bbb-3(b)(1), unless the authorization is terminated  or revoked sooner.       Influenza A by PCR NEGATIVE NEGATIVE Final   Influenza B by PCR NEGATIVE NEGATIVE Final    Comment: (NOTE) The Xpert Xpress SARS-CoV-2/FLU/RSV plus assay is intended as an aid in the diagnosis of influenza from Nasopharyngeal swab specimens and should not be used as a sole basis for treatment. Nasal washings and aspirates are unacceptable for Xpert Xpress SARS-CoV-2/FLU/RSV testing.  Fact Sheet for Patients: EntrepreneurPulse.com.au  Fact Sheet for Healthcare Providers: IncredibleEmployment.be  This test is not yet approved or cleared by the [place] FDA and has been authorized for detection and/or diagnosis of SARS-CoV-2 by FDA under an Emergency Use Authorization (EUA). This EUA will remain in effect (meaning this test can be used) for the duration of the COVID-19 declaration under Section 564(b)(1) of  the Act, 21 U.S.C. section 360bbb-3(b)(1), unless the authorization is terminated or revoked.  Performed at [facility], [place] [address], [place], [place] [address]   Respiratory (~20 pathogens) panel by PCR     Status: None   Collection Time: 12/01/21 11:25 PM   Specimen: Nasopharyngeal Swab; Respiratory  Result Value Ref Range Status   Adenovirus NOT DETECTED NOT DETECTED Final   Coronavirus 229E NOT DETECTED NOT DETECTED Final    Comment: (NOTE) The Coronavirus on the Respiratory Panel, DOES NOT test for the novel  Coronavirus (2019 nCoV)    Coronavirus HKU1 NOT DETECTED NOT DETECTED Final   Coronavirus NL63 NOT DETECTED NOT DETECTED Final   Coronavirus OC43 NOT DETECTED NOT DETECTED Final   Metapneumovirus NOT DETECTED NOT DETECTED Final   Rhinovirus / Enterovirus NOT DETECTED NOT DETECTED Final   Influenza A NOT DETECTED NOT DETECTED Final    Influenza B NOT DETECTED NOT DETECTED Final   Parainfluenza Virus 1 NOT DETECTED NOT DETECTED Final   Parainfluenza Virus 2 NOT DETECTED NOT DETECTED Final   Parainfluenza Virus 3 NOT DETECTED NOT DETECTED Final   Parainfluenza Virus 4 NOT DETECTED NOT DETECTED Final   Respiratory Syncytial Virus NOT DETECTED NOT DETECTED Final   Bordetella pertussis NOT DETECTED NOT DETECTED Final   Bordetella Parapertussis NOT DETECTED NOT DETECTED Final   Chlamydophila pneumoniae NOT DETECTED NOT DETECTED Final   Mycoplasma pneumoniae NOT DETECTED NOT DETECTED Final    Comment: Performed at [facility], [place]. [address], [place],  [address]  MRSA Next Gen by PCR, Nasal     Status: None   Collection Time: 12/02/21  1:04 PM   Specimen: Nasal Mucosa; Nasal Swab  Result Value Ref Range Status   MRSA by PCR Next Gen NOT DETECTED NOT DETECTED Final    Comment: (NOTE) The GeneXpert MRSA Assay (FDA approved for NASAL specimens only), is one component of a comprehensive MRSA colonization surveillance program. It is not intended to diagnose MRSA infection nor to guide or monitor treatment for MRSA infections. Test performance is not FDA approved in patients less than 52 years old. Performed at [facility], [place] [address], [place],  [address]       Studies:  DG Chest Port 1 View  Result Date: 12/02/2021 CLINICAL DATA:  Shortness of breath. EXAM: PORTABLE CHEST 1 VIEW COMPARISON:  Chest x-ray 11 8 2023 FINDINGS: The cardiac silhouette, mediastinal contours are stable. Persistent and slightly progressive left upper and lower lobe pneumonia. Suspect small parapneumonic effusion. The right lung remains clear. IMPRESSION: Persistent and slightly progressive left upper and lower lobe pneumonia. Electronically Signed   By: Marijo Sanes M.D.   On: 12/02/2021 08:44   DG Chest Port 1 View  Result Date: 12/01/2021 CLINICAL DATA:  Possible sepsis low-grade fever EXAM: PORTABLE  CHEST 1 VIEW COMPARISON:  CT 10/21/2021 FINDINGS: Airspace disease at the left lung base. No pleural effusion. Mild cardiomegaly. No pneumothorax. Clips in the left axilla IMPRESSION: Airspace disease at the left lung base concerning for pneumonia. Cardiomegaly. Electronically Signed   By: Donavan Foil M.D.   On: 12/01/2021 23:01    Assessment: 66 y.o. female   Sepsis from left lower lobe pneumonia Neutropenic fever Pancytopenia, secondary to MDS and chemo Hyponatremia Type 2 diabetes and hyperglycemia History of breast cancer HTN    Plan:  -lab reviewed.  She received blood transfusion yesterday in ED.  Platelet is trending down, secondary to chemotherapy, giving her  sepsis, I recommend platelet transfusion to keep it above 15K-20K -She is on broad antibiotics, management per the primary care team -We will hold on G-CSF due to her high risk MDS, and risk of leukemia transformation. -Consider blood transfusion for hemoglobin less than 7.5 -I will f/u as needed. Please call me if anything I can assist.    [name], MD 12/02/2021

## 2021-12-02 NOTE — H&P (Addendum)
History and Physical    Patient: Amanda Duarte WRU:[id number] DOB: 24-Nov-1955 DOA: 12/01/2021 DOS: the patient was seen and examined on 12/02/2021 PCP: [name], NP  Patient coming from: Home  Chief Complaint: fatigue  HPI: Amanda Duarte is a 66 y.o. female with medical history significant of HTN, HLD, anemia, anxiety/depression, DM2, breast CA. Presenting with fevers. Family reports that the patient was in the her normal state of health until about 2 days ago. At that time, she complained of weakness and fatigue. She stayed in bed all day. At the time she didn't complain of CP, dyspnea, palpitations, N/V/D or fevers. She didn't have any sick contacts. Family checked in on her yesterday, she was feverish and confused. They became concerned and called for EMS. They deny any other aggravating or alleviating factors.   Review of Systems: As mentioned in the history of present illness. All other systems reviewed and are negative. Past Medical History:  Diagnosis Date   Cancer ([place]) 03/2017   left breast cancer   Diabetes mellitus without complication ([place])    Hyperlipidemia    Hypertension    Malignant neoplasm of left breast ([place])    Stroke (cerebrum) ([place])    Tibial plateau fracture, left    04-13-17 had ORIF   Past Surgical History:  Procedure Laterality Date   MASTECTOMY Left 2019   MASTECTOMY MODIFIED RADICAL Left 11/20/2017   Procedure: LEFT MODIFIED RADICAL MASTECTOMY;  Surgeon: Fanny Skates, MD;  Location: [facility];  Service: General;  Laterality: Left;   ORIF TIBIA PLATEAU Left 04/13/2017   Procedure: OPEN REDUCTION INTERNAL FIXATION (ORIF) TIBIAL PLATEAU;  Surgeon: Shona Needles, MD;  Location: [facility];  Service: Orthopedics;  Laterality: Left;   PORT-A-CATH REMOVAL Right 11/20/2017   Procedure: REMOVAL PORT-A-CATH;  Surgeon: Fanny Skates, MD;  Location: [facility];  Service: General;  Laterality: Right;   PORTACATH PLACEMENT Right 05/31/2017   Procedure: INSERTION PORT-A-CATH;   Surgeon: Fanny Skates, MD;  Location: [facility];  Service: General;  Laterality: Right;   Social History:  reports that she has never smoked. She has never used smokeless tobacco. She reports that she does not drink alcohol and does not use drugs.  Allergies  Allergen Reactions   Victoza [Liraglutide] Nausea And Vomiting    Family History  Problem Relation Age of Onset   Heart Problems Mother    Diabetes Son    Breast cancer Neg Hx    Colon cancer Neg Hx    Rectal cancer Neg Hx    Stomach cancer Neg Hx    Esophageal cancer Neg Hx     Prior to Admission medications   Medication Sig Start Date End Date Taking? Authorizing Provider  Accu-Chek Softclix Lancets lancets Use to check blood sugar 3 times daily. E11.65 10/28/21   Argentina Donovan, PA-C  atorvastatin (LIPITOR) 40 MG tablet Take 1 tablet (40 mg total) by mouth daily. 10/05/21   [name], NP  Blood Glucose Monitoring Suppl (ACCU-CHEK GUIDE) w/Device KIT use kit to check blood glucose three times daily 02/15/21   [name], NP  Blood Pressure Monitor DEVI Please provide patient with insurance approved blood pressure monitor 01/14/19   [name], NP  citalopram (CELEXA) 20 MG tablet Take 1 tablet (20 mg total) by mouth daily. For depression 10/05/21   [name], NP  fenofibrate (TRICOR) 145 MG tablet Take 1 tablet (145 mg total) by mouth daily. 10/05/21   Raul Del,  Vernia Buff, NP  glimepiride (AMARYL) 4 MG tablet Take 1 tablet (4 mg total) by mouth in the morning and at bedtime. 10/05/21   [name], NP  glucose blood (ACCU-CHEK GUIDE) test strip Use to check blood sugar 3 times daily. E11.65 10/28/21   Argentina Donovan, PA-C  hydrOXYzine (ATARAX) 10 MG tablet Take 1 tablet (10 mg total) by mouth 3 (three) times daily as needed for anxiety. 10/05/21   [name], NP  insulin glargine (LANTUS) 100 UNIT/ML Solostar Pen Inject 25 Units into the skin at bedtime. 10/26/21   Eugenie Filler, MD  insulin lispro (HUMALOG) 100 UNIT/ML injection Inject 0.04 mLs (4 Units total) into the skin 3 (three) times daily before meals. 02/17/21   [name], NP  Insulin Pen Needle 31G X 5 MM MISC USE AS INSTRUCTED. INJECT INTO THE SKIN ONCE NIGHTLY. 02/15/21 02/15/22  [name], NP  Insulin Syringe-Needle U-100 30G X 5/16" 1 ML MISC Use as directed to inject into the skin 3 times daily. 02/17/21   [name], NP  losartan (COZAAR) 50 MG tablet Take 1 tablet (50 mg total) by mouth daily. 10/05/21   [name], NP  ondansetron (ZOFRAN) 8 MG tablet Take 1 tablet (8 mg total) by mouth every 8 (eight) hours as needed for nausea or vomiting. 11/10/21   [name], MD  pantoprazole (PROTONIX) 40 MG tablet Take 1 tablet (40 mg total) by mouth daily at 6 (six) AM. 10/27/21   Eugenie Filler, MD  potassium chloride SA (KLOR-CON M) 20 MEQ tablet Take 1 tablet (20 mEq total) by mouth daily. 10/05/21   [name], NP  prochlorperazine (COMPAZINE) 10 MG tablet Take 1 tablet (10 mg total) by mouth every 6 (six) hours as needed for nausea or vomiting. 11/10/21   [name], MD  sitaGLIPtin-metformin (JANUMET) 50-1000 MG tablet TAKE 1 TABLET BY MOUTH 2 TIMES DAILY 10/05/21   [name], NP    Physical Exam: Vitals:   12/02/21 0600 12/02/21 0645 12/02/21 0700 12/02/21 0730  BP: 125/81  136/65 (!) 155/85  Pulse: (!) 116 (!) 125 (!) 126 (!) 131  Resp: (!) 23 (!) 24 (!) 29 (!) 29  Temp:    (!) 103.2 F (39.6 C)  TempSrc:    Oral  SpO2: 95% 92% 91% 91%   General: 66 y.o. female resting in bed in NAD Eyes: PERRL, normal sclera ENMT: Nares patent w/o discharge, orophaynx clear, dentition normal, ears w/o discharge/lesions/ulcers Neck: Supple, trachea midline Cardiovascular: tachy, +S1, S2, no m/g/r, equal pulses throughout Respiratory: b/l crackles, increased WOB on 2-3L [place] GI: BS+, NDNT, no masses noted, no organomegaly noted MSK: No e/c/c Neuro: A&O x 3, no focal  deficits Psyc: Somewhat confused and groggy, but calm/cooperative  Data Reviewed:  Results for orders placed or performed during the hospital encounter of 12/01/21 (from the past 24 hour(s))  Lactic acid, plasma     Status: Abnormal   Collection Time: 12/01/21 11:13 PM  Result Value Ref Range   Lactic Acid, Venous 8.2 (HH) 0.5 - 1.9 mmol/L  Comprehensive metabolic panel     Status: Abnormal   Collection Time: 12/01/21 11:13 PM  Result Value Ref Range   Sodium 122 (L) 135 - 145 mmol/L   Potassium 4.0 3.5 - 5.1 mmol/L   Chloride 94 (L) 98 - 111 mmol/L   CO2 19 (L) 22 - 32 mmol/L   Glucose, Bld 335 (H) 70 -  99 mg/dL   BUN 12 8 - 23 mg/dL   Creatinine, Ser 0.60 0.44 - 1.00 mg/dL   Calcium 7.9 (L) 8.9 - 10.3 mg/dL   Total Protein 7.0 6.5 - 8.1 g/dL   Albumin 2.2 (L) 3.5 - 5.0 g/dL   AST 15 15 - 41 U/L   ALT 10 0 - 44 U/L   Alkaline Phosphatase 48 38 - 126 U/L   Total Bilirubin 1.0 0.3 - 1.2 mg/dL   GFR, Estimated >60 >60 mL/min   Anion gap 9 5 - 15  CBC with Differential     Status: Abnormal   Collection Time: 12/01/21 11:13 PM  Result Value Ref Range   WBC 1.8 (L) 4.0 - 10.5 K/uL   RBC 2.23 (L) 3.87 - 5.11 MIL/uL   Hemoglobin 6.7 (LL) 12.0 - 15.0 g/dL   HCT 20.1 (L) 36.0 - 46.0 %   MCV 90.1 80.0 - 100.0 fL   MCH 30.0 26.0 - 34.0 pg   MCHC 33.3 30.0 - 36.0 g/dL   RDW 14.9 11.5 - 15.5 %   Platelets 15 (LL) 150 - 400 K/uL   nRBC 0.0 0.0 - 0.2 %   Neutrophils Relative % 41 %   Neutro Abs 0.8 (L) 1.7 - 7.7 K/uL   Lymphocytes Relative 47 %   Lymphs Abs 0.9 0.7 - 4.0 K/uL   Monocytes Relative 3 %   Monocytes Absolute 0.1 0.1 - 1.0 K/uL   Eosinophils Relative 0 %   Eosinophils Absolute 0.0 0.0 - 0.5 K/uL   Basophils Relative 1 %   Basophils Absolute 0.0 0.0 - 0.1 K/uL   WBC Morphology MILD LEFT SHIFT (1-5% METAS, OCC MYELO, OCC BANDS)    Immature Granulocytes 8 %   Abs Immature Granulocytes 0.15 (H) 0.00 - 0.07 K/uL   Reactive, Benign Lymphocytes PRESENT   Protime-INR      Status: Abnormal   Collection Time: 12/01/21 11:13 PM  Result Value Ref Range   Prothrombin Time 19.0 (H) 11.4 - 15.2 seconds   INR 1.6 (H) 0.8 - 1.2  APTT     Status: Abnormal   Collection Time: 12/01/21 11:13 PM  Result Value Ref Range   aPTT 39 (H) 24 - 36 seconds  Resp Panel by RT-PCR (Flu A&B, Covid) Anterior Nasal Swab     Status: None   Collection Time: 12/01/21 11:25 PM   Specimen: Anterior Nasal Swab  Result Value Ref Range   SARS Coronavirus 2 by RT PCR NEGATIVE NEGATIVE   Influenza A by PCR NEGATIVE NEGATIVE   Influenza B by PCR NEGATIVE NEGATIVE  Lactic acid, plasma     Status: None   Collection Time: 12/02/21  2:00 AM  Result Value Ref Range   Lactic Acid, Venous 1.8 0.5 - 1.9 mmol/L  Prepare RBC (crossmatch)     Status: None   Collection Time: 12/02/21  2:00 AM  Result Value Ref Range   Order Confirmation      ORDER PROCESSED BY BLOOD BANK Performed at [facility], [address], [place], [place] [address]   Type and screen [place]     Status: None (Preliminary result)   Collection Time: 12/02/21  2:00 AM  Result Value Ref Range   ABO/RH(D) O POS    Antibody Screen NEG    Sample Expiration 12/05/2021,2359    Unit Number [id number]    Blood Component Type RBC, LR IRR    Unit division 00  Status of Unit ISSUED    Transfusion Status OK TO TRANSFUSE    Crossmatch Result      Compatible Performed at [facility] [address], [place], [place] [address]   Urinalysis, Routine w reflex microscopic Urine, Clean Catch     Status: Abnormal   Collection Time: 12/02/21  3:00 AM  Result Value Ref Range   Color, Urine YELLOW YELLOW   APPearance CLEAR CLEAR   Specific Gravity, Urine 1.010 1.005 - 1.030   pH 5.0 5.0 - 8.0   Glucose, UA >=500 (A) NEGATIVE mg/dL   Hgb urine dipstick NEGATIVE NEGATIVE   Bilirubin Urine NEGATIVE NEGATIVE   Ketones, ur NEGATIVE NEGATIVE mg/dL   Protein, ur NEGATIVE  NEGATIVE mg/dL   Nitrite NEGATIVE NEGATIVE   Leukocytes,Ua NEGATIVE NEGATIVE   RBC / HPF 0-5 0 - 5 RBC/hpf   WBC, UA 0-5 0 - 5 WBC/hpf   Bacteria, UA NONE SEEN NONE SEEN   Squamous Epithelial / LPF 0-5 0 - 5   CXR: Airspace disease at the left lung base concerning for pneumonia. Cardiomegaly.  Assessment and Plan: Sepsis secondary to LLL PNA     - admit to inpt, SDU     - continue broad spec abx for now     - CXR as above     - developed crackles after blood transfusion and fluids; there is concern for volume overload after fluids vs TRALI; rpt CXR has been ordered, fluids have been held     - will trend lactic acid for now, but holding fluids: BP is ok, HR is up but she is febrile     - spoke with PCCM, they will see here     - follow Bld Cx     - check urine legionella, strep  Hyponatremia     - corrected N+ is 128; trend for now as we are holding fluid d/t overload  DM2 w/ hyperglycemia     - not in DKA     - SSI, A1c, glucose checks    Pancytopenia Hx of myelodysplastic syndrome     - no evidence of bleed     - she's had multiple recent transfusions of plts and RBCs w/ onco     - have notified Onco ([name]) the patient is being admitted, she will follow     - given 1 unit pRBCs in ED got Hgb of 6.7     - defer further pRBCs and plt transfusions to onco     - ANC is 800; currently covered with cefepime  Hx of Breast CA     - Onco onboard  HTN     - she came in hypotensive and required fluid boluses; will hold home regimen for right now  GERD     - PPI  HLD     - continue home regimen when confirmed  Hypokalemia     - replace K+; check Mg2+  Advance Care Planning:   Code Status: FULL  Consults: Onco, PCCM  Family Communication: w/ daughter by phone  Severity of Illness: The appropriate patient status for this patient is INPATIENT. Inpatient status is judged to be reasonable and necessary in order to provide the required intensity of service to ensure  the patient's safety. The patient's presenting symptoms, physical exam findings, and initial radiographic and laboratory data in the context of their chronic comorbidities is felt to place them at high risk for further clinical deterioration. Furthermore, it is  not anticipated that the patient will be medically stable for discharge from the hospital within 2 midnights of admission.   * I certify that at the point of admission it is my clinical judgment that the patient will require inpatient hospital care spanning beyond 2 midnights from the point of admission due to high intensity of service, high risk for further deterioration and high frequency of surveillance required.*  Author: Jonnie Finner, DO 12/02/2021 7:37 AM  For on call review [email or web address].

## 2021-12-02 NOTE — ED Notes (Signed)
Upon blood finishing and assessing patient, SaO2 noted to be 88% on room air. 2L [place] placed, SaO2 improved to 94%. Pt noted to have crackles in all lung fields. Pt noted to be febrile (see VS flowsheets) with HR 130s. MD made aware of patient's VS and Tylenol given as ordered. 1L LR ordered by MD but paused when pt began requiring O2 and noted to have crackles. See new orders.

## 2021-12-02 NOTE — ED Notes (Signed)
Lab called to add on respiratory panel.

## 2021-12-02 NOTE — ED Notes (Signed)
Portable Xray at bedside.

## 2021-12-02 NOTE — Progress Notes (Signed)
[facility] Progress Note Patient Name: Amanda Duarte DOB: 04-19-1955 MRN: [id number]   Date of Service  12/02/2021  HPI/Events of Note  Nursing concern for patient developing SOB, sinus tachycardia and tachypnea during fluid bolus. Nursing has stopped fluid bolus. Creatinine = 0.30. BP = 154/76. RR  = 32-41.  eICU Interventions  Plan: Portable CXR STAT. Albuterol 2.5 mg via neb Q 3 hours PRN SOB or wheezing.  Lasix 40 mg IV X 1. BiPAP PRN.     Intervention Category Major Interventions: Hypoxemia - evaluation and management  Lysle Dingwall 12/02/2021, 7:37 PM

## 2021-12-02 NOTE — ED Notes (Signed)
Pt not feeling well today, has a fever, brought in by ambulance.

## 2021-12-02 NOTE — Progress Notes (Signed)
A consult was received from an ED physician for Vancomycin per pharmacy dosing.  The patient's profile has been reviewed for ht/wt/allergies/indication/available labs.   A one time order has been placed for Vancomycin 1g. Previously administered Azithromycin and Cefepime.   Further antibiotics/pharmacy consults should be ordered by admitting physician if indicated.                       Thank you,  Gretta Arab PharmD, BCPS [facility] main pharmacy [phone] 12/02/2021 7:52 AM

## 2021-12-02 NOTE — Consult Note (Signed)
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 Was in [place] until about 2d PTA developed weakness, fatigue. Family saw her yesterday, confused, febrile, called EMS. She says she has some cough but not expectorating anything. Doesn't report any other infectious sx.   Had PLT transfusion 11/4. In ED febrile and found to have LLL/lingula pneumonia, given vanc/cefepime/azithro, 2.75L LR, started on LR infusion. Transfused 1U pRBC at 0700 11/9.  Also noted to have worsening hyponatremia na 122 (corrected 124-126). Lactic 8 ->2.2, worsening pancytopenia  Pertinent  Medical History  HTN Anemia DM2 Breast CA MDS  Significant Hospital Events: Including procedures, antibiotic start and stop dates in addition to other pertinent events     Interim History / Subjective:    Objective   Blood pressure 120/60, pulse (!) 109, temperature 99.6 F (37.6 C), temperature source Oral, resp. rate (!) 34, SpO2 93 %.        Intake/Output Summary (Last 24 hours) at 12/02/2021 1003 Last data filed at 12/02/2021 0913 Gross per 24 hour  Intake 540 ml  Output --  Net 540 ml   There were no vitals filed for this visit.  Examination: General appearance: 66 y.o., female, NAD, conversant  Eyes: anicteric sclerae; PERRL, tracking appropriately HENT: NCAT; dry MM Neck: Trachea midline; no lymphadenopathy, no JVD Lungs: faint crackles on left, with normal respiratory effort CV: RRR, no murmur  Abdomen: Soft, non-tender; non-distended, BS present  Extremities: No  peripheral edema, warm Skin: Normal turgor and texture; no rash Psych: Appropriate affect Neuro: Alert and oriented to person and place, no focal deficit   Bedside US with collapsible IVC   Resolved Hospital Problem list     Assessment & Plan:   # Neutropenic fever # Severe sepsis with CAP with multidrug resistance risk factors with acute hypoxic respiratory failure - RVP, urine legionella and strep - vanc/cefepime/azithro, follow cultures and narrow as able - fever control  # Hypotension - agree with holding home antihypertensives - still looks on the dry side on bedside US to me - agree with continuing MIVF for now and would probably handle another bolus if MAP sustaining <65  # Hyponatremia Low urine sodium. Still looks hypovolemic to me. Is on celexa.  - f/u response to further fluid resuscitation - urine osm pending  # Pancytopenia # MDS Needs irradiated, leukoreduced products - trend cbc - PLT for <8k (though onc has requested if <20k per last clinic note), <50k if significant bleeding - pRBC  # History of breast cancer  # DM2 with hyperglycemia - SSI  # HTN  - agree with holding home regimen  Best Practice (right click and "Reselect all SmartList Selections" daily)   Per primary  Labs   CBC: Recent Labs  Lab 11/26/21 1252 12/01/21 2313  WBC 2.2* 1.8*  NEUTROABS 0.6* 0.8*  HGB 9.9* 6.7*  HCT 28.1* 20.1*  MCV 88.1 90.1  PLT 13* 15*    Basic Metabolic Panel: Recent Labs  Lab 11/26/21 1252 12/01/21 2313  NA 129* 122*  K 3.7 4.0  CL 95* 94*  CO2 27 19*  GLUCOSE 284* 335*  BUN 12 12  CREATININE 0.56 0.60  CALCIUM 9.2 7.9*   GFR: Estimated Creatinine Clearance: 49.7 mL/min (by C-G formula based on SCr of 0.6 mg/dL). Recent Labs  Lab 11/26/21 1252 12/01/21 2313 12/02/21 0200 12/02/21 0751  WBC 2.2* 1.8*  --   --   LATICACIDVEN  --  8.2* 1.8 2.2*    Liver Function Tests: Recent Labs  Lab 11/26/21 1252 12/01/21 2313  AST 11* 15   ALT 5 10  ALKPHOS 84 48  BILITOT 0.6 1.0  PROT 9.1* 7.0  ALBUMIN 3.5 2.2*   No results for input(s): "LIPASE", "AMYLASE" in the last 168 hours. No results for input(s): "AMMONIA" in the last 168 hours.  ABG    Component Value Date/Time   TCO2 30 05/31/2017 1113     Coagulation Profile: Recent Labs  Lab 12/01/21 2313  INR 1.6*    Cardiac Enzymes: No results for input(s): "CKTOTAL", "CKMB", "CKMBINDEX", "TROPONINI" in the last 168 hours.  HbA1C: Hemoglobin A1C  Date/Time Value Ref Range Status  10/05/2021 12:06 PM 13.3 (A) 4.0 - 5.6 % Final  02/15/2021 11:58 AM 15.0 (A) 4.0 - 5.6 % Final   HbA1c, POC (controlled diabetic range)  Date/Time Value Ref Range Status  10/28/2021 12:07 PM 11.4 (A) 0.0 - 7.0 % Final  06/30/2020 04:02 PM 8.8 (A) 0.0 - 7.0 % Final    CBG: No results for input(s): "GLUCAP" in the last 168 hours.  Review of Systems:   12 point review of systems is negative except as in HPI  Past Medical History:  She,  has a past medical history of Cancer ([place]) (03/2017), Diabetes mellitus without complication ([place]), Hyperlipidemia, Hypertension, Malignant neoplasm of left breast ([place]), Stroke (cerebrum) ([place]), and Tibial plateau fracture, left.   Surgical History:   Past Surgical History:  Procedure Laterality Date   MASTECTOMY Left 2019   MASTECTOMY MODIFIED RADICAL Left 11/20/2017   Procedure: LEFT MODIFIED RADICAL MASTECTOMY;  Surgeon: Fanny Skates, MD;  Location: [facility];  Service: General;  Laterality: Left;   ORIF TIBIA PLATEAU Left 04/13/2017   Procedure: OPEN REDUCTION INTERNAL FIXATION (ORIF) TIBIAL PLATEAU;  Surgeon: Shona Needles, MD;  Location: [facility];  Service: Orthopedics;  Laterality: Left;   PORT-A-CATH REMOVAL Right 11/20/2017   Procedure: REMOVAL PORT-A-CATH;  Surgeon: Fanny Skates, MD;  Location: [facility];  Service: General;  Laterality: Right;   PORTACATH PLACEMENT Right 05/31/2017   Procedure: INSERTION PORT-A-CATH;  Surgeon: Fanny Skates, MD;  Location: [facility];  Service: General;  Laterality: Right;     Social History:   reports that she has never smoked. She has never used smokeless tobacco. She reports that she does not drink alcohol and does not use drugs.   Family History:  Her family history includes Diabetes in her son; Heart Problems in her mother. There is no history of Breast cancer, Colon cancer, Rectal cancer, Stomach cancer, or Esophageal cancer.   Allergies Allergies  Allergen Reactions   Victoza [Liraglutide] Nausea And Vomiting     Home Medications  Prior to Admission medications   Medication Sig Start Date End Date Taking? Authorizing Provider  Accu-Chek Softclix Lancets lancets Use to check blood sugar 3 times daily. E11.65 10/28/21   Argentina Donovan, PA-C  atorvastatin (LIPITOR) 40 MG tablet Take 1 tablet (40 mg total) by mouth daily. 10/05/21   [name], NP  Blood Glucose Monitoring Suppl (  ACCU-CHEK GUIDE) w/Device KIT use kit to check blood glucose three times daily 02/15/21   [name], NP  Blood Pressure Monitor DEVI Please provide patient with insurance approved blood pressure monitor 01/14/19   [name], NP  citalopram (CELEXA) 20 MG tablet Take 1 tablet (20 mg total) by mouth daily. For depression 10/05/21   [name], NP  fenofibrate (TRICOR) 145 MG tablet Take 1 tablet (145 mg total) by mouth daily. 10/05/21   [name], NP  glimepiride (AMARYL) 4 MG tablet Take 1 tablet (4 mg total) by mouth in the morning and at bedtime. 10/05/21   [name], NP  glucose blood (ACCU-CHEK GUIDE) test strip Use to check blood sugar 3 times daily. E11.65 10/28/21   Argentina Donovan, PA-C  hydrOXYzine (ATARAX) 10 MG tablet Take 1 tablet (10 mg total) by mouth 3 (three) times daily as needed for anxiety. 10/05/21   [name], NP  insulin glargine (LANTUS) 100 UNIT/ML Solostar Pen Inject 25 Units into the skin at bedtime. 10/26/21   Eugenie Filler, MD  insulin lispro (HUMALOG) 100 UNIT/ML injection Inject 0.04 mLs (4 Units total) into the skin 3 (three) times daily before meals. 02/17/21   [name], NP  Insulin Pen Needle 31G X 5 MM MISC USE AS INSTRUCTED. INJECT INTO THE SKIN ONCE NIGHTLY. 02/15/21 02/15/22  [name], NP  Insulin Syringe-Needle U-100 30G X 5/16" 1 ML MISC Use as directed to inject into the skin 3 times daily. 02/17/21   [name], NP  losartan (COZAAR) 50 MG tablet Take 1 tablet (50 mg total) by mouth daily. 10/05/21   [name], NP  ondansetron (ZOFRAN) 8 MG tablet Take 1 tablet (8 mg total) by mouth every 8 (eight) hours as needed for nausea or vomiting. 11/10/21   [name], MD  pantoprazole (PROTONIX) 40 MG tablet Take 1 tablet (40 mg total) by mouth daily at 6 (six) AM. 10/27/21   Eugenie Filler, MD  potassium chloride SA (KLOR-CON M) 20 MEQ tablet Take 1 tablet (20 mEq total) by mouth daily. 10/05/21   [name], NP  prochlorperazine (COMPAZINE) 10 MG tablet Take 1 tablet (10 mg total) by mouth every 6 (six) hours as needed for nausea or vomiting. 11/10/21   [name], MD  sitaGLIPtin-metformin (JANUMET) 50-1000 MG tablet TAKE 1 TABLET BY MOUTH 2 TIMES DAILY 10/05/21   [name], NP     Critical care time: n/a

## 2021-12-02 NOTE — ED Notes (Signed)
Per Dr. Marylyn Ishihara, hold IVF for now.

## 2021-12-03 ENCOUNTER — Ambulatory Visit: Payer: Medicare Other | Admitting: Hematology

## 2021-12-03 ENCOUNTER — Other Ambulatory Visit: Payer: Medicare Other

## 2021-12-03 DIAGNOSIS — A419 Sepsis, unspecified organism: Secondary | ICD-10-CM | POA: Diagnosis not present

## 2021-12-03 DIAGNOSIS — J189 Pneumonia, unspecified organism: Secondary | ICD-10-CM | POA: Diagnosis not present

## 2021-12-03 LAB — COMPREHENSIVE METABOLIC PANEL
ALT: 11 U/L (ref 0–44)
AST: 18 U/L (ref 15–41)
Albumin: 2 g/dL — ABNORMAL LOW (ref 3.5–5.0)
Alkaline Phosphatase: 44 U/L (ref 38–126)
Anion gap: 8 (ref 5–15)
BUN: 12 mg/dL (ref 8–23)
CO2: 23 mmol/L (ref 22–32)
Calcium: 8 mg/dL — ABNORMAL LOW (ref 8.9–10.3)
Chloride: 98 mmol/L (ref 98–111)
Creatinine, Ser: 0.61 mg/dL (ref 0.44–1.00)
GFR, Estimated: >60 mL/min (ref >60)
Glucose, Bld: 326 mg/dL — ABNORMAL HIGH (ref 70–99)
Potassium: 3.4 mmol/L — ABNORMAL LOW (ref 3.5–5.1)
Sodium: 129 mmol/L — ABNORMAL LOW (ref 135–145)
Total Bilirubin: 0.8 mg/dL (ref 0.3–1.2)
Total Protein: 6.6 g/dL (ref 6.5–8.1)

## 2021-12-03 LAB — PROTIME-INR
INR: 1.5 — ABNORMAL HIGH (ref 0.8–1.2)
Prothrombin Time: 18 seconds — ABNORMAL HIGH (ref 11.4–15.2)

## 2021-12-03 LAB — BPAM RBC
Blood Product Expiration Date: 202312072359
ISSUE DATE / TIME: 202311090430
Unit Type and Rh: 5100

## 2021-12-03 LAB — PROCALCITONIN: Procalcitonin: 21.18 ng/mL

## 2021-12-03 LAB — URINE CULTURE

## 2021-12-03 LAB — CBC
HCT: 23.4 % — ABNORMAL LOW (ref 36.0–46.0)
Hemoglobin: 8.1 g/dL — ABNORMAL LOW (ref 12.0–15.0)
MCH: 30.8 pg (ref 26.0–34.0)
MCHC: 34.6 g/dL (ref 30.0–36.0)
MCV: 89 fL (ref 80.0–100.0)
Platelets: 33 10*3/uL — ABNORMAL LOW (ref 150–400)
RBC: 2.63 MIL/uL — ABNORMAL LOW (ref 3.87–5.11)
RDW: 14.9 % (ref 11.5–15.5)
WBC: 2 10*3/uL — ABNORMAL LOW (ref 4.0–10.5)
nRBC: 0 % (ref 0.0–0.2)

## 2021-12-03 LAB — BPAM PLATELET PHERESIS
Blood Product Expiration Date: 202311122359
ISSUE DATE / TIME: 202311092301
Unit Type and Rh: 5100

## 2021-12-03 LAB — GLUCOSE, CAPILLARY
Glucose-Capillary: 284 mg/dL — ABNORMAL HIGH (ref 70–99)
Glucose-Capillary: 258 mg/dL — ABNORMAL HIGH (ref 70–99)
Glucose-Capillary: 65 mg/dL — ABNORMAL LOW (ref 70–99)
Glucose-Capillary: 75 mg/dL (ref 70–99)
Glucose-Capillary: 88 mg/dL (ref 70–99)

## 2021-12-03 LAB — TYPE AND SCREEN
ABO/RH(D): O POS
Antibody Screen: NEGATIVE
Unit division: 0

## 2021-12-03 LAB — PREPARE PLATELET PHERESIS: Unit division: 0

## 2021-12-03 LAB — CORTISOL-AM, BLOOD: Cortisol - AM: 21 ug/dL (ref 6.7–22.6)

## 2021-12-03 LAB — LEGIONELLA PNEUMOPHILA SEROGP 1 UR AG: L. pneumophila Serogp 1 Ur Ag: NEGATIVE

## 2021-12-03 LAB — MAGNESIUM: Magnesium: 1.9 mg/dL (ref 1.7–2.4)

## 2021-12-03 LAB — PHOSPHORUS: Phosphorus: 2.8 mg/dL (ref 2.5–4.6)

## 2021-12-03 MED ORDER — PANTOPRAZOLE SODIUM 40 MG PO TBEC
40.0000 mg | DELAYED_RELEASE_TABLET | Freq: Every day | ORAL | Status: DC
  Administered 2021-12-04 – 2021-12-06 (×3): 40 mg via ORAL
  Filled 2021-12-03 (×3): qty 1

## 2021-12-03 MED ORDER — POTASSIUM CHLORIDE CRYS ER 20 MEQ PO TBCR
20.0000 meq | EXTENDED_RELEASE_TABLET | Freq: Two times a day (BID) | ORAL | Status: DC
  Administered 2021-12-03 (×2): 20 meq via ORAL
  Filled 2021-12-03 (×2): qty 1

## 2021-12-03 MED ORDER — INSULIN DETEMIR 100 UNIT/ML ~~LOC~~ SOLN
20.0000 [IU] | Freq: Every day | SUBCUTANEOUS | Status: DC
  Administered 2021-12-03 – 2021-12-07 (×5): 20 [IU] via SUBCUTANEOUS
  Filled 2021-12-03 (×6): qty 0.2

## 2021-12-03 MED ORDER — INSULIN ASPART 100 UNIT/ML IJ SOLN
4.0000 [IU] | Freq: Three times a day (TID) | INTRAMUSCULAR | Status: DC
  Administered 2021-12-03 – 2021-12-06 (×4): 4 [IU] via SUBCUTANEOUS

## 2021-12-03 MED ORDER — GUAIFENESIN 100 MG/5ML PO LIQD: 5.0000 mL | ORAL | Status: DC | PRN

## 2021-12-03 NOTE — Progress Notes (Signed)
PROGRESS NOTE    Amanda Duarte  TFT:[id number] DOB: 1955-05-26 DOA: 12/01/2021 PCP: [name], NP    Brief Narrative:  66 year old with history of hypertension, hyperlipidemia, anxiety depression, type 2 diabetes and breast cancer and recently diagnosed myelodysplastic syndrome and pancytopenia due to MDS presented to the emergency room for fever, weakness and hyperglycemia.  Symptoms worse for 2 days.  Emergency room pancytopenic, temperature 103.  Chest x-ray with multifocal pneumonia.   Assessment & Plan:   Sepsis secondary to multifocal pneumonia, pancytopenia and profound neutropenia. Sepsis present on admission due to temperature 103, blood pressure 89/41, heart rate 133. Patient currently remains on cefepime, Flagyl and vancomycin we will continue until clinical improvement. Chest physiotherapy, incentive spirometry, deep breathing exercises, sputum induction, mucolytic's and bronchodilators. Sputum cultures, blood cultures, Legionella and streptococcal antigen.  Negative so far. Supplemental oxygen to keep saturations more than 90%.  Mobilize.  Hyponatremia with dehydration: Supportive management.  Type 2 diabetes with hyperglycemia: On SSI.  Blood sugars elevated today.  Resume long-acting insulin and prandial insulin today.  Pancytopenia with myelodysplastic syndrome: Hemoglobin 6.7-1 unit of PRBC given-8.1.  We will continue to monitor. Platelets, receiving multiple platelet transfusions also as outpatient.  Today more than 30,000.  No active bleeding. A1c 900  GERD: On PPI  Hypokalemia: Replace further.  Essential hypertension: Antihypertensives on hold.  Takes losartan at home.   DVT prophylaxis: SCDs Start: 12/02/21 1022   Code Status: Full code Family Communication: Son on phone Disposition Plan: Status is: Inpatient Remains inpatient appropriate because: Severe sepsis, IV antibiotics     Consultants:  Pulmonary  Procedures:   None  Antimicrobials:  Vancomycin, cefepime and Flagyl 11/9----   Subjective: Patient seen and examined.  Overnight she had some shortness of breath but improved now.  Has cough but denies any complaints.  Afebrile overnight.  Currently on room air.  Requested to use her son for translation on the phone.  Objective: Vitals:   12/03/21 0700 12/03/21 0706 12/03/21 0800 12/03/21 0900  BP: (!) 89/42 (!) 94/40 (!) 103/53   Pulse: 83 82 92 (!) 108  Resp: (!) 27 (!) 28 (!) 22 (!) 26  Temp:   98.5 F (36.9 C)   TempSrc:   Oral   SpO2: 92% 92% 94% 93%  Weight:      Height:        Intake/Output Summary (Last 24 hours) at 12/03/2021 1017 Last data filed at 12/03/2021 1012 Gross per 24 hour  Intake 4999.2 ml  Output 1850 ml  Net 3149.2 ml   Filed Weights   12/02/21 1303  Weight: 52.7 kg    Examination:  General exam: Appears calm and comfortable at rest.  Mildly anxious appropriately. Respiratory system: Mostly conducted upper airway sounds. Cardiovascular system: S1 & S2 heard, RRR. No JVD, murmurs, rubs, gallops or clicks. No pedal edema. Gastrointestinal system: Abdomen is nondistended, soft and nontender. No organomegaly or masses felt. Normal bowel sounds heard. Central nervous system: Alert and oriented. No focal neurological deficits. Extremities: Symmetric 5 x 5 power. Skin: No rashes, lesions or ulcers Psychiatry: Judgement and insight appear normal. Mood & affect appropriate.     Data Reviewed: I have personally reviewed following labs and imaging studies  CBC: Recent Labs  Lab 11/26/21 1252 12/01/21 2313 12/02/21 1100 12/03/21 0522  WBC 2.2* 1.8* 1.9* 2.0*  NEUTROABS 0.6* 0.8* 0.9*  --   HGB 9.9* 6.7* 8.6* 8.1*  HCT 28.1* 20.1* 24.4* 23.4*  MCV 88.1 90.1 87.1  89.0  PLT 13* 15* 12* 33*   Basic Metabolic Panel: Recent Labs  Lab 11/26/21 1252 12/01/21 2313 12/02/21 1100 12/02/21 1921 12/03/21 0522  NA 129* 122* 127* 132* 129*  K 3.7 4.0 3.0* 3.8  3.4*  CL 95* 94* 98 106 98  CO2 27 19* 23 19* 23  GLUCOSE 284* 335* 291* 229* 326*  BUN '12 12 12 12 12  '$ CREATININE 0.56 0.60 0.60 0.53 0.61  CALCIUM 9.2 7.9* 8.4* 8.0* 8.0*  MG  --   --  1.3*  --  1.9  PHOS  --   --   --  1.9* 2.8   GFR: Estimated Creatinine Clearance: 49.7 mL/min (by C-G formula based on SCr of 0.61 mg/dL). Liver Function Tests: Recent Labs  Lab 11/26/21 1252 12/01/21 2313 12/02/21 1100 12/02/21 1921 12/03/21 0522  AST 11* 15 14*  --  18  ALT '5 10 10  '$ --  11  ALKPHOS 84 48 48  --  44  BILITOT 0.6 1.0 1.6*  --  0.8  PROT 9.1* 7.0 7.1  --  6.6  ALBUMIN 3.5 2.2* 2.3* 2.3* 2.0*   No results for input(s): "LIPASE", "AMYLASE" in the last 168 hours. No results for input(s): "AMMONIA" in the last 168 hours. Coagulation Profile: Recent Labs  Lab 12/01/21 2313 12/03/21 0522  INR 1.6* 1.5*   Cardiac Enzymes: No results for input(s): "CKTOTAL", "CKMB", "CKMBINDEX", "TROPONINI" in the last 168 hours. BNP (last 3 results) No results for input(s): "PROBNP" in the last 8760 hours. HbA1C: No results for input(s): "HGBA1C" in the last 72 hours. CBG: Recent Labs  Lab 12/02/21 1129 12/02/21 1618 12/02/21 2150 12/03/21 0841  GLUCAP 236* 230* 235* 284*   Lipid Profile: No results for input(s): "CHOL", "HDL", "LDLCALC", "TRIG", "CHOLHDL", "LDLDIRECT" in the last 72 hours. Thyroid Function Tests: No results for input(s): "TSH", "T4TOTAL", "FREET4", "T3FREE", "THYROIDAB" in the last 72 hours. Anemia Panel: No results for input(s): "VITAMINB12", "FOLATE", "FERRITIN", "TIBC", "IRON", "RETICCTPCT" in the last 72 hours. Sepsis Labs: Recent Labs  Lab 12/01/21 2313 12/02/21 0200 12/02/21 0751 12/03/21 0522  PROCALCITON  --   --   --  21.18  LATICACIDVEN 8.2* 1.8 2.2*  --     Recent Results (from the past 240 hour(s))  Blood Culture (routine x 2)     Status: None (Preliminary result)   Collection Time: 12/01/21 11:13 PM   Specimen: BLOOD  Result Value Ref  Range Status   Specimen Description   Final    BLOOD RIGHT ANTECUBITAL Performed at [facility], [place] [address] [place], [place] [address]    Special Requests   Final    BOTTLES DRAWN AEROBIC AND ANAEROBIC Blood Culture results may not be optimal due to an excessive volume of blood received in culture bottles Performed at [facility] [address] [place], [place] [address]    Culture   Final    NO GROWTH 1 DAY Performed at [facility], [place] [address] [place], [place] [address]    Report Status PENDING  Incomplete  Blood Culture (routine x 2)     Status: None (Preliminary result)   Collection Time: 12/01/21 11:13 PM   Specimen: BLOOD  Result Value Ref Range Status   Specimen Description   Final    BLOOD SITE NOT SPECIFIED Performed at [place] [address] [place], [place] [address]    Special Requests   Final    NONE  Performed at [facility], [place] [address], [place], [place] [address]    Culture   Final    NO GROWTH 1 DAY Performed at [facility], [place] [address], [place], [place] [address]    Report Status PENDING  Incomplete  Resp Panel by RT-PCR (Flu A&B, Covid) Anterior Nasal Swab     Status: None   Collection Time: 12/01/21 11:25 PM   Specimen: Anterior Nasal Swab  Result Value Ref Range Status   SARS Coronavirus 2 by RT PCR NEGATIVE NEGATIVE Final    Comment: (NOTE) SARS-CoV-2 target nucleic acids are NOT DETECTED.  The SARS-CoV-2 RNA is generally detectable in upper respiratory specimens during the acute phase of infection. The lowest concentration of SARS-CoV-2 viral copies this assay can detect is 138 copies/mL. A negative result does not preclude SARS-Cov-2 infection and should not be used as the sole basis for treatment or other patient management decisions. A negative result may occur with  improper specimen collection/handling, submission of specimen  other than nasopharyngeal swab, presence of viral mutation(s) within the areas targeted by this assay, and inadequate number of viral copies(<138 copies/mL). A negative result must be combined with clinical observations, patient history, and epidemiological information. The expected result is Negative.  Fact Sheet for Patients:  EntrepreneurPulse.com.au  Fact Sheet for Healthcare Providers:  IncredibleEmployment.be  This test is no t yet approved or cleared by the [place] FDA and  has been authorized for detection and/or diagnosis of SARS-CoV-2 by FDA under an Emergency Use Authorization (EUA). This EUA will remain  in effect (meaning this test can be used) for the duration of the COVID-19 declaration under Section 564(b)(1) of the Act, 21 U.S.C.section 360bbb-3(b)(1), unless the authorization is terminated  or revoked sooner.       Influenza A by PCR NEGATIVE NEGATIVE Final   Influenza B by PCR NEGATIVE NEGATIVE Final    Comment: (NOTE) The Xpert Xpress SARS-CoV-2/FLU/RSV plus assay is intended as an aid in the diagnosis of influenza from Nasopharyngeal swab specimens and should not be used as a sole basis for treatment. Nasal washings and aspirates are unacceptable for Xpert Xpress SARS-CoV-2/FLU/RSV testing.  Fact Sheet for Patients: EntrepreneurPulse.com.au  Fact Sheet for Healthcare Providers: IncredibleEmployment.be  This test is not yet approved or cleared by the [place] FDA and has been authorized for detection and/or diagnosis of SARS-CoV-2 by FDA under an Emergency Use Authorization (EUA). This EUA will remain in effect (meaning this test can be used) for the duration of the COVID-19 declaration under Section 564(b)(1) of the Act, 21 U.S.C. section 360bbb-3(b)(1), unless the authorization is terminated or revoked.  Performed at [facility], [place] [address] [place],  [address]   Respiratory (~20 pathogens) panel by PCR     Status: None   Collection Time: 12/01/21 11:25 PM   Specimen: Nasopharyngeal Swab; Respiratory  Result Value Ref Range Status   Adenovirus NOT DETECTED NOT DETECTED Final   Coronavirus 229E NOT DETECTED NOT DETECTED Final    Comment: (NOTE) The Coronavirus on the Respiratory Panel, DOES NOT test for the novel  Coronavirus (2019 nCoV)    Coronavirus HKU1 NOT DETECTED NOT DETECTED Final   Coronavirus NL63 NOT DETECTED NOT DETECTED Final   Coronavirus OC43 NOT DETECTED NOT DETECTED Final   Metapneumovirus NOT DETECTED NOT DETECTED Final   Rhinovirus / Enterovirus NOT DETECTED NOT DETECTED Final   Influenza A NOT DETECTED NOT DETECTED Final   Influenza B NOT  DETECTED NOT DETECTED Final   Parainfluenza Virus 1 NOT DETECTED NOT DETECTED Final   Parainfluenza Virus 2 NOT DETECTED NOT DETECTED Final   Parainfluenza Virus 3 NOT DETECTED NOT DETECTED Final   Parainfluenza Virus 4 NOT DETECTED NOT DETECTED Final   Respiratory Syncytial Virus NOT DETECTED NOT DETECTED Final   Bordetella pertussis NOT DETECTED NOT DETECTED Final   Bordetella Parapertussis NOT DETECTED NOT DETECTED Final   Chlamydophila pneumoniae NOT DETECTED NOT DETECTED Final   Mycoplasma pneumoniae NOT DETECTED NOT DETECTED Final    Comment: Performed at [facility], [place] [address], [place], [place] [address]  Urine Culture     Status: Abnormal   Collection Time: 12/02/21  3:00 AM   Specimen: In/Out Cath Urine  Result Value Ref Range Status   Specimen Description   Final    IN/OUT CATH URINE Performed at [place] [facility] [address], [place], [place] [address]    Special Requests   Final    NONE Performed at [facility], [place] [address], [place], [place] [address]    Culture MULTIPLE SPECIES PRESENT, SUGGEST RECOLLECTION (A)  Final   Report Status 12/03/2021 FINAL  Final  MRSA Next Gen by PCR, Nasal      Status: None   Collection Time: 12/02/21  1:04 PM   Specimen: Nasal Mucosa; Nasal Swab  Result Value Ref Range Status   MRSA by PCR Next Gen NOT DETECTED NOT DETECTED Final    Comment: (NOTE) The GeneXpert MRSA Assay (FDA approved for NASAL specimens only), is one component of a comprehensive MRSA colonization surveillance program. It is not intended to diagnose MRSA infection nor to guide or monitor treatment for MRSA infections. Test performance is not FDA approved in patients less than 39 years old. Performed at [facility], [place] [address] [place], [place] [address]          Radiology Studies: DG CHEST PORT 1 VIEW  Result Date: 12/02/2021 CLINICAL DATA:  Hypoxia. EXAM: PORTABLE CHEST 1 VIEW COMPARISON:  December 02, 2021 (8:25 a.m.) FINDINGS: The heart size and mediastinal contours are within normal limits. Persistent moderate severity left upper lobe and left lower lobe atelectasis and/or infiltrate is seen. There is a small, stable left pleural effusion. No pneumothorax is identified. Multiple surgical clips are seen along the left axilla. The visualized skeletal structures are unremarkable. IMPRESSION: Persistent moderate severity left upper lobe and left lower lobe atelectasis and/or infiltrate. Electronically Signed   By: Virgina Norfolk M.D.   On: 12/02/2021 20:15   DG Chest Port 1 View  Result Date: 12/02/2021 CLINICAL DATA:  Shortness of breath. EXAM: PORTABLE CHEST 1 VIEW COMPARISON:  Chest x-ray 11 8 2023 FINDINGS: The cardiac silhouette, mediastinal contours are stable. Persistent and slightly progressive left upper and lower lobe pneumonia. Suspect small parapneumonic effusion. The right lung remains clear. IMPRESSION: Persistent and slightly progressive left upper and lower lobe pneumonia. Electronically Signed   By: Marijo Sanes M.D.   On: 12/02/2021 08:44   DG Chest Port 1 View  Result Date: 12/01/2021 CLINICAL DATA:  Possible sepsis  low-grade fever EXAM: PORTABLE CHEST 1 VIEW COMPARISON:  CT 10/21/2021 FINDINGS: Airspace disease at the left lung base. No pleural effusion. Mild cardiomegaly. No pneumothorax. Clips in the left axilla IMPRESSION: Airspace disease at the left lung base concerning for pneumonia. Cardiomegaly. Electronically Signed   By: Donavan Foil M.D.   On: 12/01/2021 23:01  Scheduled Meds:  Chlorhexidine Gluconate Cloth  6 each Topical Daily   insulin aspart  0-15 Units Subcutaneous TID WC   insulin aspart  0-5 Units Subcutaneous QHS   insulin aspart  4 Units Subcutaneous TID WC   insulin detemir  20 Units Subcutaneous Daily   [START ON 12/04/2021] pantoprazole  40 mg Oral Daily   potassium chloride  20 mEq Oral BID   Continuous Infusions:  ceFEPime (MAXIPIME) IV Stopped (12/03/21 0948)   metronidazole 500 mg (12/03/21 1015)   vancomycin       LOS: 1 day    Time spent: 35 minutes    Barb Merino, MD [facility] Pager [phone]

## 2021-12-03 NOTE — Progress Notes (Signed)
No need of bipap at this time. Pt is doing well at this time.

## 2021-12-03 NOTE — Progress Notes (Signed)
Amanda Duarte   DOB:[birth year]   BX#:[id number]   NVB#:[id number]  Hem/onc follow up   Subjective: Patient is overall doing better, but still has intermittent fever.  Her vital signs are more stable today, hypotension has resolved.  She is alert and oriented, able to eat and drink.   Objective:  Vitals:   12/03/21 1600 12/03/21 1700  BP: (!) 99/50 (!) 103/58  Pulse: 76 81  Resp: (!) 25 (!) 24  Temp:    SpO2: 98% 98%    Body mass index is 22.69 kg/m.  Intake/Output Summary (Last 24 hours) at 12/03/2021 1720 Last data filed at 12/03/2021 1244 Gross per 24 hour  Intake 3054.67 ml  Output 1850 ml  Net 1204.67 ml     Sclerae unicteric  Abdomen benign  MSK no focal spinal tenderness, no peripheral edema  Neuro nonfocal    CBG (last 3)  Recent Labs    12/03/21 1226 12/03/21 1655 12/03/21 1656  GLUCAP 258* 65* 75     Labs:  Urine Studies No results for input(s): "UHGB", "CRYS" in the last 72 hours.  Invalid input(s): "UACOL", "UAPR", "USPG", "UPH", "UTP", "UGL", "UKET", "UBIL", "UNIT", "UROB", "ULEU", "UEPI", "UWBC", "URBC", "UBAC", "CAST", "UCOM", "BILUA"  Basic Metabolic Panel: Recent Labs  Lab 12/01/21 2313 12/02/21 1100 12/02/21 1921 12/03/21 0522  NA 122* 127* 132* 129*  K 4.0 3.0* 3.8 3.4*  CL 94* 98 106 98  CO2 19* 23 19* 23  GLUCOSE 335* 291* 229* 326*  BUN '12 12 12 12  '$ CREATININE 0.60 0.60 0.53 0.61  CALCIUM 7.9* 8.4* 8.0* 8.0*  MG  --  1.3*  --  1.9  PHOS  --   --  1.9* 2.8   GFR Estimated Creatinine Clearance: 49.7 mL/min (by C-G formula based on SCr of 0.61 mg/dL). Liver Function Tests: Recent Labs  Lab 12/01/21 2313 12/02/21 1100 12/02/21 1921 12/03/21 0522  AST 15 14*  --  18  ALT 10 10  --  11  ALKPHOS 48 48  --  44  BILITOT 1.0 1.6*  --  0.8  PROT 7.0 7.1  --  6.6  ALBUMIN 2.2* 2.3* 2.3* 2.0*   No results for input(s): "LIPASE", "AMYLASE" in the last 168 hours. No results for input(s): "AMMONIA" in the last 168 hours. Coagulation  profile Recent Labs  Lab 12/01/21 2313 12/03/21 0522  INR 1.6* 1.5*    CBC: Recent Labs  Lab 12/01/21 2313 12/02/21 1100 12/03/21 0522  WBC 1.8* 1.9* 2.0*  NEUTROABS 0.8* 0.9*  --   HGB 6.7* 8.6* 8.1*  HCT 20.1* 24.4* 23.4*  MCV 90.1 87.1 89.0  PLT 15* 12* 33*   Cardiac Enzymes: No results for input(s): "CKTOTAL", "CKMB", "CKMBINDEX", "TROPONINI" in the last 168 hours. BNP: Invalid input(s): "POCBNP" CBG: Recent Labs  Lab 12/02/21 2150 12/03/21 0841 12/03/21 1226 12/03/21 1655 12/03/21 1656  GLUCAP 235* 284* 258* 65* 75   D-Dimer No results for input(s): "DDIMER" in the last 72 hours. Hgb A1c No results for input(s): "HGBA1C" in the last 72 hours. Lipid Profile No results for input(s): "CHOL", "HDL", "LDLCALC", "TRIG", "CHOLHDL", "LDLDIRECT" in the last 72 hours. Thyroid function studies No results for input(s): "TSH", "T4TOTAL", "T3FREE", "THYROIDAB" in the last 72 hours.  Invalid input(s): "FREET3" Anemia work up No results for input(s): "VITAMINB12", "FOLATE", "FERRITIN", "TIBC", "IRON", "RETICCTPCT" in the last 72 hours. Microbiology Recent Results (from the past 240 hour(s))  Blood Culture (routine x 2)     Status: None (Preliminary  result)   Collection Time: 12/01/21 11:13 PM   Specimen: BLOOD  Result Value Ref Range Status   Specimen Description   Final    BLOOD RIGHT ANTECUBITAL Performed at [facility] [address] [place], [place] [address]    Special Requests   Final    BOTTLES DRAWN AEROBIC AND ANAEROBIC Blood Culture results may not be optimal due to an excessive volume of blood received in culture bottles Performed at [facility] [address], [place], [place] [address]    Culture   Final    NO GROWTH 1 DAY Performed at [facility], [place] [address], [place], [place] [address]    Report Status PENDING  Incomplete  Blood Culture (routine x 2)     Status: None (Preliminary result)    Collection Time: 12/01/21 11:13 PM   Specimen: BLOOD  Result Value Ref Range Status   Specimen Description   Final    BLOOD SITE NOT SPECIFIED Performed at [facility] [address], [place], [place] [address]    Special Requests   Final    NONE Performed at [facility], [place] [address], [place], [place] [address]    Culture   Final    NO GROWTH 1 DAY Performed at [facility], [place] [address], [place], [place] [address]    Report Status PENDING  Incomplete  Resp Panel by RT-PCR (Flu A&B, Covid) Anterior Nasal Swab     Status: None   Collection Time: 12/01/21 11:25 PM   Specimen: Anterior Nasal Swab  Result Value Ref Range Status   SARS Coronavirus 2 by RT PCR NEGATIVE NEGATIVE Final    Comment: (NOTE) SARS-CoV-2 target nucleic acids are NOT DETECTED.  The SARS-CoV-2 RNA is generally detectable in upper respiratory specimens during the acute phase of infection. The lowest concentration of SARS-CoV-2 viral copies this assay can detect is 138 copies/mL. A negative result does not preclude SARS-Cov-2 infection and should not be used as the sole basis for treatment or other patient management decisions. A negative result may occur with  improper specimen collection/handling, submission of specimen other than nasopharyngeal swab, presence of viral mutation(s) within the areas targeted by this assay, and inadequate number of viral copies(<138 copies/mL). A negative result must be combined with clinical observations, patient history, and epidemiological information. The expected result is Negative.  Fact Sheet for Patients:  EntrepreneurPulse.com.au  Fact Sheet for Healthcare Providers:  IncredibleEmployment.be  This test is no t yet approved or cleared by the [place] FDA and  has been authorized for detection and/or diagnosis of SARS-CoV-2 by FDA under an Emergency Use Authorization (EUA).  This EUA will remain  in effect (meaning this test can be used) for the duration of the COVID-19 declaration under Section 564(b)(1) of the Act, 21 U.S.C.section 360bbb-3(b)(1), unless the authorization is terminated  or revoked sooner.       Influenza A by PCR NEGATIVE NEGATIVE Final   Influenza B by PCR NEGATIVE NEGATIVE Final    Comment: (NOTE) The Xpert Xpress SARS-CoV-2/FLU/RSV plus assay is intended as an aid in the diagnosis of influenza from Nasopharyngeal swab specimens and should not be used as a sole basis for treatment. Nasal washings and aspirates are unacceptable for Xpert Xpress SARS-CoV-2/FLU/RSV testing.  Fact Sheet for Patients: EntrepreneurPulse.com.au  Fact Sheet for Healthcare Providers: IncredibleEmployment.be  This test is not yet approved or cleared by the [place] FDA and has been  authorized for detection and/or diagnosis of SARS-CoV-2 by FDA under an Emergency Use Authorization (EUA). This EUA will remain in effect (meaning this test can be used) for the duration of the COVID-19 declaration under Section 564(b)(1) of the Act, 21 U.S.C. section 360bbb-3(b)(1), unless the authorization is terminated or revoked.  Performed at [facility], [place] [address] [place], [place] [address]   Respiratory (~20 pathogens) panel by PCR     Status: None   Collection Time: 12/01/21 11:25 PM   Specimen: Nasopharyngeal Swab; Respiratory  Result Value Ref Range Status   Adenovirus NOT DETECTED NOT DETECTED Final   Coronavirus 229E NOT DETECTED NOT DETECTED Final    Comment: (NOTE) The Coronavirus on the Respiratory Panel, DOES NOT test for the novel  Coronavirus (2019 nCoV)    Coronavirus HKU1 NOT DETECTED NOT DETECTED Final   Coronavirus NL63 NOT DETECTED NOT DETECTED Final   Coronavirus OC43 NOT DETECTED NOT DETECTED Final   Metapneumovirus NOT DETECTED NOT DETECTED Final   Rhinovirus / Enterovirus NOT  DETECTED NOT DETECTED Final   Influenza A NOT DETECTED NOT DETECTED Final   Influenza B NOT DETECTED NOT DETECTED Final   Parainfluenza Virus 1 NOT DETECTED NOT DETECTED Final   Parainfluenza Virus 2 NOT DETECTED NOT DETECTED Final   Parainfluenza Virus 3 NOT DETECTED NOT DETECTED Final   Parainfluenza Virus 4 NOT DETECTED NOT DETECTED Final   Respiratory Syncytial Virus NOT DETECTED NOT DETECTED Final   Bordetella pertussis NOT DETECTED NOT DETECTED Final   Bordetella Parapertussis NOT DETECTED NOT DETECTED Final   Chlamydophila pneumoniae NOT DETECTED NOT DETECTED Final   Mycoplasma pneumoniae NOT DETECTED NOT DETECTED Final    Comment: Performed at [facility], [place]. [address], [place], [place] [address]  Urine Culture     Status: Abnormal   Collection Time: 12/02/21  3:00 AM   Specimen: In/Out Cath Urine  Result Value Ref Range Status   Specimen Description   Final    IN/OUT CATH URINE Performed at [facility] [address], [place], [place] [address]    Special Requests   Final    NONE Performed at [facility], [place] [address] [place], [place] [address]    Culture MULTIPLE SPECIES PRESENT, SUGGEST RECOLLECTION (A)  Final   Report Status 12/03/2021 FINAL  Final  MRSA Next Gen by PCR, Nasal     Status: None   Collection Time: 12/02/21  1:04 PM   Specimen: Nasal Mucosa; Nasal Swab  Result Value Ref Range Status   MRSA by PCR Next Gen NOT DETECTED NOT DETECTED Final    Comment: (NOTE) The GeneXpert MRSA Assay (FDA approved for NASAL specimens only), is one component of a comprehensive MRSA colonization surveillance program. It is not intended to diagnose MRSA infection nor to guide or monitor treatment for MRSA infections. Test performance is not FDA approved in patients less than 46 years old. Performed at [facility], [place] [address] [place], [place] [address]       Studies:  DG CHEST PORT 1  VIEW  Result Date: 12/02/2021 CLINICAL DATA:  Hypoxia. EXAM: PORTABLE CHEST 1 VIEW COMPARISON:  December 02, 2021 (8:25 a.m.) FINDINGS: The heart size and mediastinal contours are within normal limits. Persistent moderate severity left upper lobe and left lower lobe atelectasis and/or infiltrate is seen. There is a small, stable left pleural effusion. No pneumothorax is identified. Multiple surgical clips are seen along the left axilla. The  visualized skeletal structures are unremarkable. IMPRESSION: Persistent moderate severity left upper lobe and left lower lobe atelectasis and/or infiltrate. Electronically Signed   By: Virgina Norfolk M.D.   On: 12/02/2021 20:15   DG Chest Port 1 View  Result Date: 12/02/2021 CLINICAL DATA:  Shortness of breath. EXAM: PORTABLE CHEST 1 VIEW COMPARISON:  Chest x-ray 11 8 2023 FINDINGS: The cardiac silhouette, mediastinal contours are stable. Persistent and slightly progressive left upper and lower lobe pneumonia. Suspect small parapneumonic effusion. The right lung remains clear. IMPRESSION: Persistent and slightly progressive left upper and lower lobe pneumonia. Electronically Signed   By: Marijo Sanes M.D.   On: 12/02/2021 08:44   DG Chest Port 1 View  Result Date: 12/01/2021 CLINICAL DATA:  Possible sepsis low-grade fever EXAM: PORTABLE CHEST 1 VIEW COMPARISON:  CT 10/21/2021 FINDINGS: Airspace disease at the left lung base. No pleural effusion. Mild cardiomegaly. No pneumothorax. Clips in the left axilla IMPRESSION: Airspace disease at the left lung base concerning for pneumonia. Cardiomegaly. Electronically Signed   By: Donavan Foil M.D.   On: 12/01/2021 23:01    Assessment: 66 y.o. female   Sepsis from left lower lobe pneumonia Neutropenic fever Pancytopenia, secondary to MDS and chemo Hyponatremia Type 2 diabetes and hyperglycemia History of breast cancer HTN    Plan:  -lab reviewed.  No need for blood transfusion today -Consider PRBC for  hemoglobin less than 7.5, and platelet transfusion for plt<15K  -Blood and urine culture have been negative, she still has intermittent fever, infection work-up per primary team. Consider CT if she has persistent fever to rule out abscess  -hold GCSF due to her high risk of AML transformation -I will follow-up as needed.   [name], MD 12/03/2021

## 2021-12-03 NOTE — Progress Notes (Signed)
CPT Q4 completed at 0819. 10 minutes, PT tolerated well, PT did do deep breathing with coughing during CPT- resulted in dry, non productive cough.

## 2021-12-03 NOTE — Progress Notes (Signed)
PCCM note  Discussed with Dr. Sloan Leiter, hospitalist.  Patient is currently on room air We will sign off.  Please call with any questions.  Marshell Garfinkel MD [facility] See Amion for pager  If no response to pager , please call [phone] until 7pm After 7:00 pm call Elink  [phone] 12/03/2021, 9:33 AM

## 2021-12-04 DIAGNOSIS — J189 Pneumonia, unspecified organism: Secondary | ICD-10-CM | POA: Diagnosis not present

## 2021-12-04 DIAGNOSIS — A419 Sepsis, unspecified organism: Secondary | ICD-10-CM | POA: Diagnosis not present

## 2021-12-04 LAB — COMPREHENSIVE METABOLIC PANEL
ALT: 9 U/L (ref 0–44)
AST: 16 U/L (ref 15–41)
Albumin: 2.1 g/dL — ABNORMAL LOW (ref 3.5–5.0)
Alkaline Phosphatase: 39 U/L (ref 38–126)
Anion gap: 6 (ref 5–15)
BUN: 12 mg/dL (ref 8–23)
CO2: 24 mmol/L (ref 22–32)
Calcium: 8.3 mg/dL — ABNORMAL LOW (ref 8.9–10.3)
Chloride: 102 mmol/L (ref 98–111)
Creatinine, Ser: 0.52 mg/dL (ref 0.44–1.00)
GFR, Estimated: >60 mL/min (ref >60)
Glucose, Bld: 57 mg/dL — ABNORMAL LOW (ref 70–99)
Potassium: 2.8 mmol/L — ABNORMAL LOW (ref 3.5–5.1)
Sodium: 132 mmol/L — ABNORMAL LOW (ref 135–145)
Total Bilirubin: 0.8 mg/dL (ref 0.3–1.2)
Total Protein: 6.3 g/dL — ABNORMAL LOW (ref 6.5–8.1)

## 2021-12-04 LAB — GLUCOSE, CAPILLARY
Glucose-Capillary: 78 mg/dL (ref 70–99)
Glucose-Capillary: 265 mg/dL — ABNORMAL HIGH (ref 70–99)
Glucose-Capillary: 206 mg/dL — ABNORMAL HIGH (ref 70–99)
Glucose-Capillary: 98 mg/dL (ref 70–99)

## 2021-12-04 LAB — CBC WITH DIFFERENTIAL/PLATELET
Abs Immature Granulocytes: 0.17 10*3/uL — ABNORMAL HIGH (ref 0.00–0.07)
Basophils Absolute: 0 10*3/uL (ref 0.0–0.1)
Basophils Relative: 2 %
Eosinophils Absolute: 0 10*3/uL (ref 0.0–0.5)
Eosinophils Relative: 0 %
HCT: 24.8 % — ABNORMAL LOW (ref 36.0–46.0)
Hemoglobin: 8.4 g/dL — ABNORMAL LOW (ref 12.0–15.0)
Immature Granulocytes: 8 %
Lymphocytes Relative: 60 %
Lymphs Abs: 1.2 10*3/uL (ref 0.7–4.0)
MCH: 30.1 pg (ref 26.0–34.0)
MCHC: 33.9 g/dL (ref 30.0–36.0)
MCV: 88.9 fL (ref 80.0–100.0)
Monocytes Absolute: 0.1 10*3/uL (ref 0.1–1.0)
Monocytes Relative: 2 %
Neutro Abs: 0.6 10*3/uL — ABNORMAL LOW (ref 1.7–7.7)
Neutrophils Relative %: 28 %
Platelets: 25 10*3/uL — CL (ref 150–400)
RBC: 2.79 MIL/uL — ABNORMAL LOW (ref 3.87–5.11)
RDW: 15 % (ref 11.5–15.5)
WBC: 2.1 10*3/uL — ABNORMAL LOW (ref 4.0–10.5)
nRBC: 0 % (ref 0.0–0.2)

## 2021-12-04 LAB — MAGNESIUM: Magnesium: 1.7 mg/dL (ref 1.7–2.4)

## 2021-12-04 LAB — PHOSPHORUS: Phosphorus: 2.4 mg/dL — ABNORMAL LOW (ref 2.5–4.6)

## 2021-12-04 MED ORDER — SODIUM CHLORIDE 0.9 % IV BOLUS
250.0000 mL | Freq: Once | INTRAVENOUS | Status: AC
  Administered 2021-12-04: 250 mL via INTRAVENOUS

## 2021-12-04 MED ORDER — ALBUMIN HUMAN 25 % IV SOLN
INTRAVENOUS | Status: AC
  Administered 2021-12-04: 25 g via INTRAVENOUS
  Filled 2021-12-04: qty 50

## 2021-12-04 MED ORDER — HYDROXYZINE HCL 10 MG PO TABS
10.0000 mg | ORAL_TABLET | Freq: Once | ORAL | Status: AC | PRN
  Administered 2021-12-05: 10 mg via ORAL
  Filled 2021-12-04: qty 1

## 2021-12-04 MED ORDER — ALBUMIN HUMAN 25 % IV SOLN: 25.0000 g | Freq: Once | INTRAVENOUS | Status: AC

## 2021-12-04 MED ORDER — POTASSIUM CHLORIDE CRYS ER 20 MEQ PO TBCR
40.0000 meq | EXTENDED_RELEASE_TABLET | Freq: Three times a day (TID) | ORAL | Status: AC
  Administered 2021-12-04 – 2021-12-05 (×4): 40 meq via ORAL
  Filled 2021-12-04 (×4): qty 2

## 2021-12-04 NOTE — Progress Notes (Signed)
No need of bipap at this time.  

## 2021-12-04 NOTE — Progress Notes (Signed)
       CROSS COVER NOTE  NAME: Amanda Duarte MRN: [id number] DOB : [birth year]    Date of Service   12/04/2021   HPI/Events of Note   Notified by bedside RN of BP 94/41, MAP 55. HR 70's. (0315 hrs) Patient admitted for sepsis secondary to multifocal pneumonia.  BP during admission~89/41.  Currently on no continuous IVF.   On 11/9, nursing staff had concerns for SOB, tachycardia, tachypnea during fluid bolus and Lasix was given.  RN notes that patient has mild crackles.  Last albumin 2.1.  Patient to receive 250 cc IVF bolus and albumin for BP.  RN has been asked to monitor for any respiratory complications.  0500-BP improvement.   Interventions/ Plan   IVF bolus IV albumin       Raenette Rover, DNP, [place]

## 2021-12-04 NOTE — Progress Notes (Signed)
PROGRESS NOTE    Amanda Duarte  YYT:[id number] DOB: Jul 10, 1955 DOA: 12/01/2021 PCP: [name], NP    Brief Narrative:  66 year old with history of hypertension, hyperlipidemia, anxiety depression, type 2 diabetes and breast cancer and recently diagnosed myelodysplastic syndrome and pancytopenia due to MDS presented to the emergency room for fever, weakness and hyperglycemia.  Symptoms worse for 2 days.  Emergency room pancytopenic, temperature 103.  Chest x-ray with multifocal pneumonia. Remains in the hospital, persistent fever.  Negative cultures.   Assessment & Plan:   Sepsis secondary to multifocal pneumonia, pancytopenia and profound neutropenia. Sepsis present on admission due to temperature 103, blood pressure 89/41, heart rate 133. Patient currently remains on cefepime, Flagyl and vancomycin.  Continue cefepime and Flagyl.  We will discontinue vancomycin. Blood cultures negative so far.  Urine culture with multiple organisms.  Likely source is pneumonia.   Chest physiotherapy, incentive spirometry, deep breathing exercises, sputum induction, mucolytic's and bronchodilators. Sputum cultures, blood cultures, Legionella and streptococcal antigen.  Negative so far. Supplemental oxygen to keep saturations more than 90%.  Mobilize. If she spikes another fever, will check CT scan of the chest abdomen pelvis to look for any complicated pneumonia/abscesses.  Hyponatremia with dehydration: Supportive management.  Advance to regular diet as she does not have an appetite.  Type 2 diabetes with hyperglycemia: On SSI.  Long-acting insulin resumed.  Pancytopenia with myelodysplastic syndrome: Hemoglobin 6.7-1 unit of PRBC given-8.1.  We will continue to monitor. Platelets, 25,000 today.  No indication for transfusion today.   A1c is 600.   Followed by oncology.    GERD: On PPI  Hypokalemia: Persistent.  Replace further.  Recheck levels.  Essential hypertension: Antihypertensives on  hold.  Takes losartan at home.   DVT prophylaxis: SCDs Start: 12/02/21 1022   Code Status: Full code Family Communication: None today. Disposition Plan: Status is: Inpatient Remains inpatient appropriate because: Severe sepsis, IV antibiotics     Consultants:  Pulmonary  Procedures:  None  Antimicrobials:  Vancomycin, cefepime and Flagyl 11/9----   Subjective: Patient seen and examined.  Overnight events noted.  Slightly lower blood pressures and was given 1 unit of albumin overnight.  Temperature maximum 103.  Patient has some cough and did good participation in chest PT therapy.  Does not have any appetite. Will allow to her to eat food from home, changed to regular diet.  Objective: Vitals:   12/04/21 0400 12/04/21 0515 12/04/21 0600 12/04/21 0756  BP: (!) 94/37  (!) 104/45   Pulse: 66  72   Resp: (!) 25  (!) 23   Temp:  97.8 F (36.6 C)  (!) 100.9 F (38.3 C)  TempSrc:  Oral  Axillary  SpO2: 99%  100%   Weight:      Height:        Intake/Output Summary (Last 24 hours) at 12/04/2021 1041 Last data filed at 12/04/2021 0818 Gross per 24 hour  Intake 400.86 ml  Output --  Net 400.86 ml   Filed Weights   12/02/21 1303  Weight: 52.7 kg    Examination:  General exam: Appears calm and comfortable at rest.  Anxious.  Tremulous. Respiratory system: Mostly conducted upper airway sounds. Cardiovascular system: S1 & S2 heard, RRR. No JVD, murmurs, rubs, gallops or clicks. No pedal edema. Gastrointestinal system: Abdomen is nondistended, soft and nontender. No organomegaly or masses felt. Normal bowel sounds heard. Central nervous system: Alert and oriented. No focal neurological deficits. Extremities: Symmetric 5 x 5 power. Skin:  No rashes, lesions or ulcers Psychiatry: Judgement and insight appear normal. Mood & affect appropriate.     Data Reviewed: I have personally reviewed following labs and imaging studies  CBC: Recent Labs  Lab 12/01/21 2313  12/02/21 1100 12/03/21 0522 12/04/21 0254  WBC 1.8* 1.9* 2.0* 2.1*  NEUTROABS 0.8* 0.9*  --  0.6*  HGB 6.7* 8.6* 8.1* 8.4*  HCT 20.1* 24.4* 23.4* 24.8*  MCV 90.1 87.1 89.0 88.9  PLT 15* 12* 33* 25*   Basic Metabolic Panel: Recent Labs  Lab 12/01/21 2313 12/02/21 1100 12/02/21 1921 12/03/21 0522 12/04/21 0254  NA 122* 127* 132* 129* 132*  K 4.0 3.0* 3.8 3.4* 2.8*  CL 94* 98 106 98 102  CO2 19* 23 19* 23 24  GLUCOSE 335* 291* 229* 326* 57*  BUN '12 12 12 12 12  '$ CREATININE 0.60 0.60 0.53 0.61 0.52  CALCIUM 7.9* 8.4* 8.0* 8.0* 8.3*  MG  --  1.3*  --  1.9 1.7  PHOS  --   --  1.9* 2.8 2.4*   GFR: Estimated Creatinine Clearance: 49.7 mL/min (by C-G formula based on SCr of 0.52 mg/dL). Liver Function Tests: Recent Labs  Lab 12/01/21 2313 12/02/21 1100 12/02/21 1921 12/03/21 0522 12/04/21 0254  AST 15 14*  --  18 16  ALT 10 10  --  11 9  ALKPHOS 48 48  --  44 39  BILITOT 1.0 1.6*  --  0.8 0.8  PROT 7.0 7.1  --  6.6 6.3*  ALBUMIN 2.2* 2.3* 2.3* 2.0* 2.1*   No results for input(s): "LIPASE", "AMYLASE" in the last 168 hours. No results for input(s): "AMMONIA" in the last 168 hours. Coagulation Profile: Recent Labs  Lab 12/01/21 2313 12/03/21 0522  INR 1.6* 1.5*   Cardiac Enzymes: No results for input(s): "CKTOTAL", "CKMB", "CKMBINDEX", "TROPONINI" in the last 168 hours. BNP (last 3 results) No results for input(s): "PROBNP" in the last 8760 hours. HbA1C: No results for input(s): "HGBA1C" in the last 72 hours. CBG: Recent Labs  Lab 12/03/21 1226 12/03/21 1655 12/03/21 1656 12/03/21 2118 12/04/21 0755  GLUCAP 258* 65* 75 88 78   Lipid Profile: No results for input(s): "CHOL", "HDL", "LDLCALC", "TRIG", "CHOLHDL", "LDLDIRECT" in the last 72 hours. Thyroid Function Tests: No results for input(s): "TSH", "T4TOTAL", "FREET4", "T3FREE", "THYROIDAB" in the last 72 hours. Anemia Panel: No results for input(s): "VITAMINB12", "FOLATE", "FERRITIN", "TIBC", "IRON",  "RETICCTPCT" in the last 72 hours. Sepsis Labs: Recent Labs  Lab 12/01/21 2313 12/02/21 0200 12/02/21 0751 12/03/21 0522  PROCALCITON  --   --   --  21.18  LATICACIDVEN 8.2* 1.8 2.2*  --     Recent Results (from the past 240 hour(s))  Blood Culture (routine x 2)     Status: None (Preliminary result)   Collection Time: 12/01/21 11:13 PM   Specimen: BLOOD  Result Value Ref Range Status   Specimen Description   Final    BLOOD RIGHT ANTECUBITAL Performed at [facility], [place] [address], [place], [place] [address]    Special Requests   Final    BOTTLES DRAWN AEROBIC AND ANAEROBIC Blood Culture results may not be optimal due to an excessive volume of blood received in culture bottles Performed at [facility] [address] [place], [place] [address]    Culture   Final    NO GROWTH 2 DAYS Performed at [facility] [address] [place], [place] [address]    Report  Status PENDING  Incomplete  Blood Culture (routine x 2)     Status: None (Preliminary result)   Collection Time: 12/01/21 11:13 PM   Specimen: BLOOD  Result Value Ref Range Status   Specimen Description   Final    BLOOD SITE NOT SPECIFIED Performed at [facility] [address], [place], [place] [address]    Special Requests   Final    NONE Performed at [facility], [place] [address], [place], [place] [address]    Culture   Final    NO GROWTH 2 DAYS Performed at [facility] [address] [place], [place] [address]    Report Status PENDING  Incomplete  Resp Panel by RT-PCR (Flu A&B, Covid) Anterior Nasal Swab     Status: None   Collection Time: 12/01/21 11:25 PM   Specimen: Anterior Nasal Swab  Result Value Ref Range Status   SARS Coronavirus 2 by RT PCR NEGATIVE NEGATIVE Final    Comment: (NOTE) SARS-CoV-2 target nucleic acids are NOT DETECTED.  The SARS-CoV-2 RNA is generally detectable in upper  respiratory specimens during the acute phase of infection. The lowest concentration of SARS-CoV-2 viral copies this assay can detect is 138 copies/mL. A negative result does not preclude SARS-Cov-2 infection and should not be used as the sole basis for treatment or other patient management decisions. A negative result may occur with  improper specimen collection/handling, submission of specimen other than nasopharyngeal swab, presence of viral mutation(s) within the areas targeted by this assay, and inadequate number of viral copies(<138 copies/mL). A negative result must be combined with clinical observations, patient history, and epidemiological information. The expected result is Negative.  Fact Sheet for Patients:  EntrepreneurPulse.com.au  Fact Sheet for Healthcare Providers:  IncredibleEmployment.be  This test is no t yet approved or cleared by the [place] FDA and  has been authorized for detection and/or diagnosis of SARS-CoV-2 by FDA under an Emergency Use Authorization (EUA). This EUA will remain  in effect (meaning this test can be used) for the duration of the COVID-19 declaration under Section 564(b)(1) of the Act, 21 U.S.C.section 360bbb-3(b)(1), unless the authorization is terminated  or revoked sooner.       Influenza A by PCR NEGATIVE NEGATIVE Final   Influenza B by PCR NEGATIVE NEGATIVE Final    Comment: (NOTE) The Xpert Xpress SARS-CoV-2/FLU/RSV plus assay is intended as an aid in the diagnosis of influenza from Nasopharyngeal swab specimens and should not be used as a sole basis for treatment. Nasal washings and aspirates are unacceptable for Xpert Xpress SARS-CoV-2/FLU/RSV testing.  Fact Sheet for Patients: EntrepreneurPulse.com.au  Fact Sheet for Healthcare Providers: IncredibleEmployment.be  This test is not yet approved or cleared by the [place] FDA and has been  authorized for detection and/or diagnosis of SARS-CoV-2 by FDA under an Emergency Use Authorization (EUA). This EUA will remain in effect (meaning this test can be used) for the duration of the COVID-19 declaration under Section 564(b)(1) of the Act, 21 U.S.C. section 360bbb-3(b)(1), unless the authorization is terminated or revoked.  Performed at [facility], [place] [address] [place], [place] [address]   Respiratory (~20 pathogens) panel by PCR     Status: None   Collection Time: 12/01/21 11:25 PM   Specimen: Nasopharyngeal Swab; Respiratory  Result Value Ref Range Status   Adenovirus NOT DETECTED NOT DETECTED Final   Coronavirus 229E NOT DETECTED NOT DETECTED Final    Comment: (NOTE) The Coronavirus  on the Respiratory Panel, DOES NOT test for the novel  Coronavirus (2019 nCoV)    Coronavirus HKU1 NOT DETECTED NOT DETECTED Final   Coronavirus NL63 NOT DETECTED NOT DETECTED Final   Coronavirus OC43 NOT DETECTED NOT DETECTED Final   Metapneumovirus NOT DETECTED NOT DETECTED Final   Rhinovirus / Enterovirus NOT DETECTED NOT DETECTED Final   Influenza A NOT DETECTED NOT DETECTED Final   Influenza B NOT DETECTED NOT DETECTED Final   Parainfluenza Virus 1 NOT DETECTED NOT DETECTED Final   Parainfluenza Virus 2 NOT DETECTED NOT DETECTED Final   Parainfluenza Virus 3 NOT DETECTED NOT DETECTED Final   Parainfluenza Virus 4 NOT DETECTED NOT DETECTED Final   Respiratory Syncytial Virus NOT DETECTED NOT DETECTED Final   Bordetella pertussis NOT DETECTED NOT DETECTED Final   Bordetella Parapertussis NOT DETECTED NOT DETECTED Final   Chlamydophila pneumoniae NOT DETECTED NOT DETECTED Final   Mycoplasma pneumoniae NOT DETECTED NOT DETECTED Final    Comment: Performed at [facility], [place] [address] [place], [place] [address]  Urine Culture     Status: Abnormal   Collection Time: 12/02/21  3:00 AM   Specimen: In/Out Cath Urine  Result Value Ref Range Status    Specimen Description   Final    IN/OUT CATH URINE Performed at [facility] [address], [place], [place] [address]    Special Requests   Final    NONE Performed at [facility], [place] [address], [place], [place] [address]    Culture MULTIPLE SPECIES PRESENT, SUGGEST RECOLLECTION (A)  Final   Report Status 12/03/2021 FINAL  Final  MRSA Next Gen by PCR, Nasal     Status: None   Collection Time: 12/02/21  1:04 PM   Specimen: Nasal Mucosa; Nasal Swab  Result Value Ref Range Status   MRSA by PCR Next Gen NOT DETECTED NOT DETECTED Final    Comment: (NOTE) The GeneXpert MRSA Assay (FDA approved for NASAL specimens only), is one component of a comprehensive MRSA colonization surveillance program. It is not intended to diagnose MRSA infection nor to guide or monitor treatment for MRSA infections. Test performance is not FDA approved in patients less than 39 years old. Performed at [facility], [place] [address] [place], [place] [address]          Radiology Studies: DG CHEST PORT 1 VIEW  Result Date: 12/02/2021 CLINICAL DATA:  Hypoxia. EXAM: PORTABLE CHEST 1 VIEW COMPARISON:  December 02, 2021 (8:25 a.m.) FINDINGS: The heart size and mediastinal contours are within normal limits. Persistent moderate severity left upper lobe and left lower lobe atelectasis and/or infiltrate is seen. There is a small, stable left pleural effusion. No pneumothorax is identified. Multiple surgical clips are seen along the left axilla. The visualized skeletal structures are unremarkable. IMPRESSION: Persistent moderate severity left upper lobe and left lower lobe atelectasis and/or infiltrate. Electronically Signed   By: Virgina Norfolk M.D.   On: 12/02/2021 20:15        Scheduled Meds:  Chlorhexidine Gluconate Cloth  6 each Topical Daily   insulin aspart  0-15 Units Subcutaneous TID WC   insulin aspart  0-5 Units Subcutaneous QHS   insulin  aspart  4 Units Subcutaneous TID WC   insulin detemir  20 Units Subcutaneous Daily   pantoprazole  40 mg Oral Daily   potassium chloride  40 mEq Oral TID   Continuous Infusions:  ceFEPime (MAXIPIME) IV Stopped (12/03/21 2144)  metronidazole Stopped (12/03/21 2151)     LOS: 2 days    Time spent: 35 minutes    Barb Merino, MD [facility] Pager [phone]

## 2021-12-04 NOTE — Progress Notes (Deleted)
CPT held at this time pt is finally asleep.

## 2021-12-05 ENCOUNTER — Inpatient Hospital Stay (HOSPITAL_COMMUNITY): Payer: Medicare Other

## 2021-12-05 DIAGNOSIS — J189 Pneumonia, unspecified organism: Secondary | ICD-10-CM | POA: Diagnosis not present

## 2021-12-05 DIAGNOSIS — A419 Sepsis, unspecified organism: Secondary | ICD-10-CM | POA: Diagnosis not present

## 2021-12-05 LAB — BASIC METABOLIC PANEL
Anion gap: 9 (ref 5–15)
BUN: 9 mg/dL (ref 8–23)
CO2: 23 mmol/L (ref 22–32)
Calcium: 8.3 mg/dL — ABNORMAL LOW (ref 8.9–10.3)
Chloride: 102 mmol/L (ref 98–111)
Creatinine, Ser: 0.46 mg/dL (ref 0.44–1.00)
GFR, Estimated: >60 mL/min (ref >60)
Glucose, Bld: 80 mg/dL (ref 70–99)
Potassium: 3.4 mmol/L — ABNORMAL LOW (ref 3.5–5.1)
Sodium: 134 mmol/L — ABNORMAL LOW (ref 135–145)

## 2021-12-05 LAB — CBC WITH DIFFERENTIAL/PLATELET
Abs Immature Granulocytes: 0.12 10*3/uL — ABNORMAL HIGH (ref 0.00–0.07)
Basophils Absolute: 0 10*3/uL (ref 0.0–0.1)
Basophils Relative: 1 %
Eosinophils Absolute: 0 10*3/uL (ref 0.0–0.5)
Eosinophils Relative: 0 %
HCT: 22.9 % — ABNORMAL LOW (ref 36.0–46.0)
Hemoglobin: 8.1 g/dL — ABNORMAL LOW (ref 12.0–15.0)
Immature Granulocytes: 8 %
Lymphocytes Relative: 58 %
Lymphs Abs: 0.8 10*3/uL (ref 0.7–4.0)
MCH: 30.6 pg (ref 26.0–34.0)
MCHC: 35.4 g/dL (ref 30.0–36.0)
MCV: 86.4 fL (ref 80.0–100.0)
Monocytes Absolute: 0 10*3/uL — ABNORMAL LOW (ref 0.1–1.0)
Monocytes Relative: 3 %
Neutro Abs: 0.4 10*3/uL — CL (ref 1.7–7.7)
Neutrophils Relative %: 30 %
Platelets: 15 10*3/uL — CL (ref 150–400)
RBC: 2.65 MIL/uL — ABNORMAL LOW (ref 3.87–5.11)
RDW: 14.9 % (ref 11.5–15.5)
WBC: 1.5 10*3/uL — ABNORMAL LOW (ref 4.0–10.5)
nRBC: 0 % (ref 0.0–0.2)

## 2021-12-05 LAB — GLUCOSE, CAPILLARY
Glucose-Capillary: 81 mg/dL (ref 70–99)
Glucose-Capillary: 73 mg/dL (ref 70–99)
Glucose-Capillary: 139 mg/dL — ABNORMAL HIGH (ref 70–99)
Glucose-Capillary: 86 mg/dL (ref 70–99)

## 2021-12-05 MED ORDER — SODIUM CHLORIDE 0.9 % IV SOLN
1.0000 g | Freq: Two times a day (BID) | INTRAVENOUS | Status: DC
  Filled 2021-12-05: qty 20

## 2021-12-05 MED ORDER — IOHEXOL 9 MG/ML PO SOLN
ORAL | Status: AC
  Administered 2021-12-05: 500 mL
  Filled 2021-12-05: qty 500

## 2021-12-05 MED ORDER — SODIUM CHLORIDE 0.9 % IV SOLN
1.0000 g | Freq: Three times a day (TID) | INTRAVENOUS | Status: DC
  Administered 2021-12-05 – 2021-12-06 (×4): 1 g via INTRAVENOUS
  Filled 2021-12-05 (×7): qty 20

## 2021-12-05 MED ORDER — VANCOMYCIN HCL 750 MG/150ML IV SOLN
750.0000 mg | INTRAVENOUS | Status: DC
  Administered 2021-12-05 – 2021-12-06 (×2): 750 mg via INTRAVENOUS
  Filled 2021-12-05 (×2): qty 150

## 2021-12-05 MED ORDER — IOHEXOL 300 MG/ML  SOLN
100.0000 mL | Freq: Once | INTRAMUSCULAR | Status: AC | PRN
  Administered 2021-12-05: 100 mL via INTRAVENOUS

## 2021-12-05 NOTE — Consult Note (Addendum)
[facility]  Total days of antibiotics 5 Reason for Consult:febrile neutropenia and pneumonia    Referring Physician: feng  Principal Problem:   Sepsis due to pneumonia [facility]) Active Problems:   Essential hypertension   DM2 (diabetes mellitus, type 2) ([place])   Malignant neoplasm of upper-inner quadrant of left breast in female, estrogen receptor positive ([place])   HLD (hyperlipidemia)   Pancytopenia (HCC)   Hyponatremia   Hypokalemia   MDS (myelodysplastic syndrome), high grade (HCC)   GERD (gastroesophageal reflux disease)    HPI: Amanda Duarte is a 66 y.o. female  with hx of breast ca but now with MDS with severe anemia and thrombocytopenia back in sep 2023, bone marrow bx showing MDS with 7% blasts, with high risks for acute leukemia transition per dr Burr Medico who follows her in [place]. She receives azacitadine for a week at end of October.on Q28d schedule. She was admitted with 2 day hx of weakness and fatigue and progressed to fevers and confusion.labs c/w with pancytopenia and hyponatremia. Cxr showed LLL pneumonia. She was started on empiric abtx and admitted to ICU. She was narrowed to cefepime since MRSA colonization was negative, over the next 48hrs had high fevers up to 102-103 with associated tachycardia up to 150s. She did not require vasopressors but her oxygen needs trended up to 10L up from 4L. When she is afebrile her HR trends down to 120s. ID asked to weigh in on abtx regimen  Past Medical History:  Diagnosis Date   Cancer ([place]) 03/2017   left breast cancer   Diabetes mellitus without complication ([place])    Hyperlipidemia    Hypertension    Malignant neoplasm of left breast ([place])    Stroke (cerebrum) (HCC)    Tibial plateau fracture, left    04-13-17 had ORIF    Allergies:  Allergies  Allergen Reactions   Victoza [Liraglutide] Nausea And Vomiting    Current antibiotics:   MEDICATIONS:  Chlorhexidine Gluconate Cloth  6 each Topical Daily    insulin aspart  0-15 Units Subcutaneous TID WC   insulin aspart  0-5 Units Subcutaneous QHS   insulin aspart  4 Units Subcutaneous TID WC   insulin detemir  20 Units Subcutaneous Daily   pantoprazole  40 mg Oral Daily    Social History   Tobacco Use   Smoking status: Never   Smokeless tobacco: Never  Vaping Use   Vaping Use: Never used  Substance Use Topics   Alcohol use: No   Drug use: No    Family History  Problem Relation Age of Onset   Heart Problems Mother    Diabetes Son    Breast cancer Neg Hx    Colon cancer Neg Hx    Rectal cancer Neg Hx    Stomach cancer Neg Hx    Esophageal cancer Neg Hx     Review of Systems -  Shortness of breath, fatigue, cough, weakness otherwise 12 point ros is negative  OBJECTIVE: Temp:  [97.2 F (36.2 C)-103.6 F (39.8 C)] 102.9 F (39.4 C) (11/12 1311) Pulse Rate:  [91-144] 143 (11/12 1300) Resp:  [22-43] 32 (11/12 1300) BP: (100-155)/(41-105) 121/105 (11/12 1300) SpO2:  [90 %-98 %] 94 % (11/12 1300) Physical Exam  Constitutional:  oriented to person, place, and time. appears well-developed and well-nourished. No distress.  HENT: [place]/AT, PERRLA, no scleral icterus Mouth/Throat: Oropharynx is clear and moist. No oropharyngeal exudate.  Cardiovascular: Normal rate, regular rhythm and normal heart  sounds. Exam reveals no gallop and no friction rub.  No murmur heard.  Pulmonary/Chest: Effort normal and breath sounds normal. No respiratory distress.  Rhonchi bilaterally Neck = supple, no nuchal rigidity Abdominal: Soft. Bowel sounds are normal.  exhibits no distension. There is no tenderness.  Lymphadenopathy: no cervical adenopathy. No axillary adenopathy Neurological: alert and oriented to person, place, and time.  Skin: Skin is warm and dry. No rash noted. No erythema. Scattered echymosis Psychiatric: a normal mood and affect.  behavior is normal.    LABS: Results for orders placed or performed during the hospital encounter  of 12/01/21 (from the past 48 hour(s))  Glucose, capillary     Status: Abnormal   Collection Time: 12/03/21  4:55 PM  Result Value Ref Range   Glucose-Capillary 65 (L) 70 - 99 mg/dL    Comment: Glucose reference range applies only to samples taken after fasting for at least 8 hours.   Comment 1 Notify RN    Comment 2 Document in Chart    Comment 3 Repeat Test   Glucose, capillary     Status: None   Collection Time: 12/03/21  4:56 PM  Result Value Ref Range   Glucose-Capillary 75 70 - 99 mg/dL    Comment: Glucose reference range applies only to samples taken after fasting for at least 8 hours.   Comment 1 Notify RN    Comment 2 Document in Chart   Glucose, capillary     Status: None   Collection Time: 12/03/21  9:18 PM  Result Value Ref Range   Glucose-Capillary 88 70 - 99 mg/dL    Comment: Glucose reference range applies only to samples taken after fasting for at least 8 hours.  Comprehensive metabolic panel     Status: Abnormal   Collection Time: 12/04/21  2:54 AM  Result Value Ref Range   Sodium 132 (L) 135 - 145 mmol/L   Potassium 2.8 (L) 3.5 - 5.1 mmol/L   Chloride 102 98 - 111 mmol/L   CO2 24 22 - 32 mmol/L   Glucose, Bld 57 (L) 70 - 99 mg/dL    Comment: Glucose reference range applies only to samples taken after fasting for at least 8 hours.   BUN 12 8 - 23 mg/dL   Creatinine, Ser 0.52 0.44 - 1.00 mg/dL   Calcium 8.3 (L) 8.9 - 10.3 mg/dL   Total Protein 6.3 (L) 6.5 - 8.1 g/dL   Albumin 2.1 (L) 3.5 - 5.0 g/dL   AST 16 15 - 41 U/L   ALT 9 0 - 44 U/L   Alkaline Phosphatase 39 38 - 126 U/L   Total Bilirubin 0.8 0.3 - 1.2 mg/dL   GFR, Estimated >60 >60 mL/min    Comment: (NOTE) Calculated using the CKD-EPI Creatinine Equation (2021)    Anion gap 6 5 - 15    Comment: Performed at [facility], [place] [address] [place], [place] [address]  Magnesium     Status: None   Collection Time: 12/04/21  2:54 AM  Result Value Ref Range   Magnesium 1.7 1.7 - 2.4  mg/dL    Comment: Performed at [facility], [place] [address], [place], [place] [address]  Phosphorus     Status: Abnormal   Collection Time: 12/04/21  2:54 AM  Result Value Ref Range   Phosphorus 2.4 (L) 2.5 - 4.6 mg/dL    Comment: Performed at [facility], [place] [address], [place], [place] [address]  CBC with Differential/Platelet     Status: Abnormal   Collection Time: 12/04/21  2:54 AM  Result Value Ref Range   WBC 2.1 (L) 4.0 - 10.5 K/uL   RBC 2.79 (L) 3.87 - 5.11 MIL/uL   Hemoglobin 8.4 (L) 12.0 - 15.0 g/dL   HCT 24.8 (L) 36.0 - 46.0 %   MCV 88.9 80.0 - 100.0 fL   MCH 30.1 26.0 - 34.0 pg   MCHC 33.9 30.0 - 36.0 g/dL   RDW 15.0 11.5 - 15.5 %   Platelets 25 (LL) 150 - 400 K/uL    Comment: Immature Platelet Fraction may be clinically indicated, consider ordering this additional test [id number] REPEATED TO VERIFY THIS CRITICAL RESULT HAS VERIFIED AND BEEN CALLED TO DEVON HARRISON , RN BY MEGAN HAYES ON 11 11 2023 AT 0410, AND HAS BEEN READ BACK. CRITICAL RESULT VERIFIED    nRBC 0.0 0.0 - 0.2 %   Neutrophils Relative % 28 %   Neutro Abs 0.6 (L) 1.7 - 7.7 K/uL   Lymphocytes Relative 60 %   Lymphs Abs 1.2 0.7 - 4.0 K/uL   Monocytes Relative 2 %   Monocytes Absolute 0.1 0.1 - 1.0 K/uL   Eosinophils Relative 0 %   Eosinophils Absolute 0.0 0.0 - 0.5 K/uL   Basophils Relative 2 %   Basophils Absolute 0.0 0.0 - 0.1 K/uL   Immature Granulocytes 8 %   Abs Immature Granulocytes 0.17 (H) 0.00 - 0.07 K/uL    Comment: Performed at [facility], [place] [address] [place], [place] [address]  Glucose, capillary     Status: None   Collection Time: 12/04/21  7:55 AM  Result Value Ref Range   Glucose-Capillary 78 70 - 99 mg/dL    Comment: Glucose reference range applies only to samples taken after fasting for at least 8 hours.   Comment 1 Notify RN    Comment 2 Document in Chart   Glucose, capillary     Status: Abnormal   Collection  Time: 12/04/21 12:32 PM  Result Value Ref Range   Glucose-Capillary 265 (H) 70 - 99 mg/dL    Comment: Glucose reference range applies only to samples taken after fasting for at least 8 hours.   Comment 1 Notify RN    Comment 2 Document in Chart   Glucose, capillary     Status: Abnormal   Collection Time: 12/04/21  3:46 PM  Result Value Ref Range   Glucose-Capillary 206 (H) 70 - 99 mg/dL    Comment: Glucose reference range applies only to samples taken after fasting for at least 8 hours.   Comment 1 Notify RN    Comment 2 Document in Chart   Glucose, capillary     Status: None   Collection Time: 12/04/21 10:41 PM  Result Value Ref Range   Glucose-Capillary 98 70 - 99 mg/dL    Comment: Glucose reference range applies only to samples taken after fasting for at least 8 hours.  CBC with Differential/Platelet     Status: Abnormal   Collection Time: 12/05/21  2:54 AM  Result Value Ref Range   WBC 1.5 (L) 4.0 - 10.5 K/uL    Comment: REPEATED TO VERIFY   RBC 2.65 (L) 3.87 - 5.11 MIL/uL   Hemoglobin 8.1 (L) 12.0 - 15.0 g/dL   HCT 22.9 (L) 36.0 - 46.0 %   MCV 86.4 80.0 - 100.0 fL   MCH 30.6 26.0 - 34.0 pg   MCHC 35.4 30.0 - 36.0  g/dL   RDW 14.9 11.5 - 15.5 %   Platelets 15 (LL) 150 - 400 K/uL    Comment: Immature Platelet Fraction may be clinically indicated, consider ordering this additional test [id number] CRITICAL VALUE NOTED.  VALUE IS CONSISTENT WITH PREVIOUSLY REPORTED AND CALLED VALUE. REPEATED TO VERIFY    nRBC 0.0 0.0 - 0.2 %   Neutrophils Relative % 30 %   Neutro Abs 0.4 (LL) 1.7 - 7.7 K/uL    Comment: REPEATED TO VERIFY CRITICAL RESULT CONSISTENT WITH PREVIOUS CRITICAL RESULT    Lymphocytes Relative 58 %   Lymphs Abs 0.8 0.7 - 4.0 K/uL   Monocytes Relative 3 %   Monocytes Absolute 0.0 (L) 0.1 - 1.0 K/uL   Eosinophils Relative 0 %   Eosinophils Absolute 0.0 0.0 - 0.5 K/uL   Basophils Relative 1 %   Basophils Absolute 0.0 0.0 - 0.1 K/uL   Immature Granulocytes 8 %    Abs Immature Granulocytes 0.12 (H) 0.00 - 0.07 K/uL    Comment: Performed at [facility], [place] [address] [place], [place] [address]  Basic metabolic panel     Status: Abnormal   Collection Time: 12/05/21  2:54 AM  Result Value Ref Range   Sodium 134 (L) 135 - 145 mmol/L   Potassium 3.4 (L) 3.5 - 5.1 mmol/L   Chloride 102 98 - 111 mmol/L   CO2 23 22 - 32 mmol/L   Glucose, Bld 80 70 - 99 mg/dL    Comment: Glucose reference range applies only to samples taken after fasting for at least 8 hours.   BUN 9 8 - 23 mg/dL   Creatinine, Ser 0.46 0.44 - 1.00 mg/dL   Calcium 8.3 (L) 8.9 - 10.3 mg/dL   GFR, Estimated >60 >60 mL/min    Comment: (NOTE) Calculated using the CKD-EPI Creatinine Equation (2021)    Anion gap 9 5 - 15    Comment: Performed at [facility], [place] [address], [place], [place] [address]  Glucose, capillary     Status: None   Collection Time: 12/05/21  8:16 AM  Result Value Ref Range   Glucose-Capillary 81 70 - 99 mg/dL    Comment: Glucose reference range applies only to samples taken after fasting for at least 8 hours.   Comment 1 Notify RN    Comment 2 Document in Chart   Glucose, capillary     Status: None   Collection Time: 12/05/21 12:56 PM  Result Value Ref Range   Glucose-Capillary 73 70 - 99 mg/dL    Comment: Glucose reference range applies only to samples taken after fasting for at least 8 hours.   Comment 1 Notify RN    Comment 2 Document in Chart     MICRO: Flu and covid negative Urine legionella negative Urine strep negative IMAGING: CT CHEST ABDOMEN PELVIS W CONTRAST  Result Date: 12/05/2021 CLINICAL DATA:  Sepsis.  Persistent fever.  Pancytopenia. EXAM: CT CHEST, ABDOMEN, AND PELVIS WITH CONTRAST TECHNIQUE: Multidetector CT imaging of the chest, abdomen and pelvis was performed following the standard protocol during bolus administration of intravenous contrast. RADIATION DOSE REDUCTION: This exam was performed  according to the departmental dose-optimization program which includes automated exposure control, adjustment of the mA and/or kV according to patient size and/or use of iterative reconstruction technique. CONTRAST:  125m OMNIPAQUE IOHEXOL 300 MG/ML  SOLN COMPARISON:  10/21/2021 FINDINGS: CT CHEST FINDINGS Cardiovascular: Heart size appears normal. No pericardial effusion. Aortic atherosclerosis. Mediastinum/Nodes: Thyroid gland, trachea,  and esophagus are unremarkable. Previous left axillary node dissection. No axillary, mediastinal, or hilar adenopathy identified. Lungs/Pleura: Severe, multifocal bilateral airspace opacities identified compatible with pneumonia. This is most severe within the left lower lobe where there is near complete consolidation. There is dense airspace consolidation noted within the posterolateral right lower lobe with overlying areas of ground-glass attenuation. Airspace disease within the inferior lingula is also identified. Patchy area of ground-glass attenuation in the left apex is unchanged from the previous exam. There is a small left pleural effusion which overlies the posterior right upper lobe and superior segment of left lower lobe. There is also a small amount of fluid identified overlying the left lung base. Musculoskeletal: Left mastectomy. No acute or suspicious osseous findings. CT ABDOMEN PELVIS FINDINGS Hepatobiliary: No focal liver abnormality is seen. No gallstones, gallbladder wall thickening, or biliary dilatation. Pancreas: Unremarkable. No pancreatic ductal dilatation or surrounding inflammatory changes. Spleen: Normal in size without focal abnormality. Adrenals/Urinary Tract: Normal adrenal glands. No nephrolithiasis, hydronephrosis or mass. Urinary bladder appears normal. Stomach/Bowel: Stomach is within normal limits. Appendix appears normal. No evidence of bowel wall thickening, distention, or inflammatory changes. Vascular/Lymphatic: Aortic atherosclerosis. No  signs of abdominopelvic adenopathy. Reproductive: Uterus and bilateral adnexa are unremarkable. Other: No free fluid or fluid collections. Musculoskeletal: No acute or significant osseous findings. Scattered subcutaneous injection sites identified within the subcutaneous soft tissues of the ventral abdominal wall. IMPRESSION: 1. Severe, multifocal bilateral airspace opacities compatible with pneumonia. This is most severe within the left lower lobe where there is near complete airspace consolidation. 2. Small left pleural effusion. 3. No acute findings identified within the abdomen or pelvis. 4.  Aortic Atherosclerosis (ICD10-I70.0). Electronically Signed   By: Kerby Moors M.D.   On: 12/05/2021 14:14    HISTORICAL MICRO/IMAGING  Assessment/Plan:  66yo F with hx of T2DM, breach ca, and recent dx of MDS with high grade blasts with pancytopenia admitted for worsening respiratory symptoms plus fever, weakness, and chills. CXR showing multifocal pneumonia.   - please expand abtx to vancomycin plus meropenem - please have PT order to do physiotherapy so that we can get respiratory culture - will check fungitell assay -will check viral respiratory panel - if no improvement after 24-48hrs after this change of abtx, then have low threshold for antifungal therapy  Pancytopenia = anticipate to get pt transfusion to keep plt closer to 10. Defer to onc for transfusions needs  Sinus tach associated with fever = continue to treat with ice pack and acetominophen to decrease fever curve.  I have personally spent 82 minutes involved in face-to-face and non-face-to-face activities for this patient on the day of the visit. Professional time spent includes the following activities: Preparing to see the patient (review of tests), Obtaining and/or reviewing separately obtained history (admission/discharge record), Performing a medically appropriate examination and/or evaluation , Ordering medications/tests/procedures,  referring and communicating with other health care professionals, Documenting clinical information in the EMR, Independently interpreting results (not separately reported), Communicating results to the patient/family/caregiver, Counseling and educating the patient/family/caregiver and Care coordination (not separately reported).

## 2021-12-05 NOTE — IPAL (Signed)
  Interdisciplinary Goals of Care Family Meeting   Date carried out: 12/05/2021  Location of the meeting: Bedside  Member's involved: Physician and Family Member or next of kin  Durable Power of Attorney or acting medical decision maker: daughters Malon Kindle [place] , Freddi Starr daughter .   Discussion: We discussed goals of care for [employer] .  Clinical status updated.  Both daughters at the bedside along with patient understanding with translation that she is developing worsening pneumonia.  Currently we are trying to control that with different antibiotics.  Cancer doctor and infection specialist is also on board and helping with management.  CT scan shows worsening pneumonia.  Probably all aggravated due to severe thrombocytopenia, neutropenia. We discussed about possible need for endotracheal intubation if her breathing status to worsen.  Currently patient is fairly maintaining well more than 88% on 89 L of oxygen and able to talk in complete sentences.  No respiratory distress.  Increasing temperature making her tachypneic and tachycardic. I asked patient if she needs to go on life support and ventilator what she would like to [name] to do.  Patient told through her daughter that we can try it but she would not like to stay on a ventilator if she is not going to get better.  For now she remains full code.  Code status: Full Code  Disposition: Continue current acute care  Time spent for the meeting: 25 minutes    Barb Merino, MD  12/05/2021, 3:22 PM

## 2021-12-05 NOTE — Progress Notes (Signed)
PROGRESS NOTE    Amanda Duarte  BBC:[id number] DOB: 12-12-1955 DOA: 12/01/2021 PCP: [name], NP    Brief Narrative:  67 year old with history of hypertension, hyperlipidemia, anxiety depression, type 2 diabetes and breast cancer and recently diagnosed myelodysplastic syndrome and pancytopenia due to MDS presented to the emergency room for fever, weakness and hyperglycemia.  Symptoms worse for 2 days.  Emergency room pancytopenic, temperature 103.  Chest x-ray with multifocal pneumonia. Remains in the hospital, persistent fever.  Negative cultures. Patient continues to have high fever and chills.   Assessment & Plan:   Sepsis secondary to multifocal pneumonia, pancytopenia and profound neutropenia. Patient continues to have intermittent high spiking fever and shortness of breath. Blood cultures negative so far.  Urine culture with multiple organisms.  Likely source is pneumonia.   Chest physiotherapy, incentive spirometry, deep breathing exercises, sputum induction, mucolytic's and bronchodilators. Sputum cultures, blood cultures, Legionella and streptococcal antigen.  Negative so far. Supplemental oxygen to keep saturations more than 90%.  Mobilize. Check CT scan of the chest abdomen pelvis today.  We will send for a culture today.  Hyponatremia with dehydration: Supportive management.  Sodium level normalizing.  Type 2 diabetes with hyperglycemia: On SSI.  Long-acting insulin resumed.  Blood sugars acceptable.  Pancytopenia with myelodysplastic syndrome: Hemoglobin 6.7-1 unit of PRBC given-8.1.  We will continue to monitor. Platelets, 15,000 today.  No indication for transfusion today.   A1c is 400.  Continues to worsen. Followed by oncology.    GERD: On PPI  Hypokalemia: Persistent.  Replace further.  Recheck levels.  Essential hypertension: Antihypertensives on hold.  Takes losartan at home.   DVT prophylaxis: SCDs Start: 12/02/21 1022   Code Status: Full  code Family Communication: None today. Disposition Plan: Status is: Inpatient Remains inpatient appropriate because: Severe sepsis, IV antibiotics     Consultants:  Pulmonary  Procedures:  None  Antimicrobials:  Vancomycin, cefepime and Flagyl 11/9----11/11 Cefepime and Flagyl 11/9---   Subjective: Patient seen and examined.  Poor historian.  Most of the time she denies any complaints.  Continues to spike fever.  Overnight events noted.  She was tachypneic and tachycardic with high temperature.  Transiently on BiPAP but currently on 3 L oxygen.  She looks comfortable at the moment.  Cough with no sputum production.  Participating in chest physiotherapy.  No family at the bedside.  Objective: Vitals:   12/05/21 0400 12/05/21 0425 12/05/21 0500 12/05/21 0800  BP: (!) 130/49  (!) 120/51   Pulse: (!) 133 (!) 132 (!) 128   Resp: (!) 38 (!) 30 (!) 30   Temp:  (!) 102.8 F (39.3 C)  (!) 103.6 F (39.8 C)  TempSrc:  Oral  Axillary  SpO2: 93% 93% 94%   Weight:      Height:        Intake/Output Summary (Last 24 hours) at 12/05/2021 1048 Last data filed at 12/04/2021 1400 Gross per 24 hour  Intake 342.28 ml  Output 400 ml  Net -57.72 ml   Filed Weights   12/02/21 1303  Weight: 52.7 kg    Examination:  General exam: Appears sick looking.  Tremulous.  Appropriately anxious. Respiratory system: Bilateral conducted upper airway sounds.  Good air entry.  Currently on 3 L oxygen without respiratory distress. Cardiovascular system: S1 & S2 heard, RRR.  Tachycardic.  No JVD, murmurs, rubs, gallops or clicks. No pedal edema. Gastrointestinal system: Abdomen is nondistended, soft and nontender. No organomegaly or masses felt. Normal bowel sounds  heard. Central nervous system: Alert and oriented. No focal neurological deficits. Extremities: Symmetric 5 x 5 power. Skin: No rashes, lesions or ulcers Psychiatry: Judgement and insight appear normal. Mood & affect appropriate.      Data Reviewed: I have personally reviewed following labs and imaging studies  CBC: Recent Labs  Lab 12/01/21 2313 12/02/21 1100 12/03/21 0522 12/04/21 0254 12/05/21 0254  WBC 1.8* 1.9* 2.0* 2.1* 1.5*  NEUTROABS 0.8* 0.9*  --  0.6* 0.4*  HGB 6.7* 8.6* 8.1* 8.4* 8.1*  HCT 20.1* 24.4* 23.4* 24.8* 22.9*  MCV 90.1 87.1 89.0 88.9 86.4  PLT 15* 12* 33* 25* 15*   Basic Metabolic Panel: Recent Labs  Lab 12/02/21 1100 12/02/21 1921 12/03/21 0522 12/04/21 0254 12/05/21 0254  NA 127* 132* 129* 132* 134*  K 3.0* 3.8 3.4* 2.8* 3.4*  CL 98 106 98 102 102  CO2 23 19* '23 24 23  '$ GLUCOSE 291* 229* 326* 57* 80  BUN '12 12 12 12 9  '$ CREATININE 0.60 0.53 0.61 0.52 0.46  CALCIUM 8.4* 8.0* 8.0* 8.3* 8.3*  MG 1.3*  --  1.9 1.7  --   PHOS  --  1.9* 2.8 2.4*  --    GFR: Estimated Creatinine Clearance: 49.7 mL/min (by C-G formula based on SCr of 0.46 mg/dL). Liver Function Tests: Recent Labs  Lab 12/01/21 2313 12/02/21 1100 12/02/21 1921 12/03/21 0522 12/04/21 0254  AST 15 14*  --  18 16  ALT 10 10  --  11 9  ALKPHOS 48 48  --  44 39  BILITOT 1.0 1.6*  --  0.8 0.8  PROT 7.0 7.1  --  6.6 6.3*  ALBUMIN 2.2* 2.3* 2.3* 2.0* 2.1*   No results for input(s): "LIPASE", "AMYLASE" in the last 168 hours. No results for input(s): "AMMONIA" in the last 168 hours. Coagulation Profile: Recent Labs  Lab 12/01/21 2313 12/03/21 0522  INR 1.6* 1.5*   Cardiac Enzymes: No results for input(s): "CKTOTAL", "CKMB", "CKMBINDEX", "TROPONINI" in the last 168 hours. BNP (last 3 results) No results for input(s): "PROBNP" in the last 8760 hours. HbA1C: No results for input(s): "HGBA1C" in the last 72 hours. CBG: Recent Labs  Lab 12/04/21 0755 12/04/21 1232 12/04/21 1546 12/04/21 2241 12/05/21 0816  GLUCAP 78 265* 206* 98 81   Lipid Profile: No results for input(s): "CHOL", "HDL", "LDLCALC", "TRIG", "CHOLHDL", "LDLDIRECT" in the last 72 hours. Thyroid Function Tests: No results for  input(s): "TSH", "T4TOTAL", "FREET4", "T3FREE", "THYROIDAB" in the last 72 hours. Anemia Panel: No results for input(s): "VITAMINB12", "FOLATE", "FERRITIN", "TIBC", "IRON", "RETICCTPCT" in the last 72 hours. Sepsis Labs: Recent Labs  Lab 12/01/21 2313 12/02/21 0200 12/02/21 0751 12/03/21 0522  PROCALCITON  --   --   --  21.18  LATICACIDVEN 8.2* 1.8 2.2*  --     Recent Results (from the past 240 hour(s))  Blood Culture (routine x 2)     Status: None (Preliminary result)   Collection Time: 12/01/21 11:13 PM   Specimen: BLOOD  Result Value Ref Range Status   Specimen Description   Final    BLOOD RIGHT ANTECUBITAL Performed at [facility], [place] [address] [place], [place] [address]    Special Requests   Final    BOTTLES DRAWN AEROBIC AND ANAEROBIC Blood Culture results may not be optimal due to an excessive volume of blood received in culture bottles Performed at [place] [address], [place], [place] [address]    Culture  Final    NO GROWTH 3 DAYS Performed at [facility], [place] [address], [place], [place] [address]    Report Status PENDING  Incomplete  Blood Culture (routine x 2)     Status: None (Preliminary result)   Collection Time: 12/01/21 11:13 PM   Specimen: BLOOD  Result Value Ref Range Status   Specimen Description   Final    BLOOD SITE NOT SPECIFIED Performed at [place] [address], [place], [place] [address]    Special Requests   Final    NONE Performed at [facility], [place] [address] [place], [place] [address]    Culture   Final    NO GROWTH 3 DAYS Performed at [facility], [place] [address], [place], [place] [address]    Report Status PENDING  Incomplete  Resp Panel by RT-PCR (Flu A&B, Covid) Anterior Nasal Swab     Status: None   Collection Time: 12/01/21 11:25 PM   Specimen: Anterior Nasal Swab  Result Value Ref Range Status   SARS Coronavirus 2  by RT PCR NEGATIVE NEGATIVE Final    Comment: (NOTE) SARS-CoV-2 target nucleic acids are NOT DETECTED.  The SARS-CoV-2 RNA is generally detectable in upper respiratory specimens during the acute phase of infection. The lowest concentration of SARS-CoV-2 viral copies this assay can detect is 138 copies/mL. A negative result does not preclude SARS-Cov-2 infection and should not be used as the sole basis for treatment or other patient management decisions. A negative result may occur with  improper specimen collection/handling, submission of specimen other than nasopharyngeal swab, presence of viral mutation(s) within the areas targeted by this assay, and inadequate number of viral copies(<138 copies/mL). A negative result must be combined with clinical observations, patient history, and epidemiological information. The expected result is Negative.  Fact Sheet for Patients:  EntrepreneurPulse.com.au  Fact Sheet for Healthcare Providers:  IncredibleEmployment.be  This test is no t yet approved or cleared by the [place] FDA and  has been authorized for detection and/or diagnosis of SARS-CoV-2 by FDA under an Emergency Use Authorization (EUA). This EUA will remain  in effect (meaning this test can be used) for the duration of the COVID-19 declaration under Section 564(b)(1) of the Act, 21 U.S.C.section 360bbb-3(b)(1), unless the authorization is terminated  or revoked sooner.       Influenza A by PCR NEGATIVE NEGATIVE Final   Influenza B by PCR NEGATIVE NEGATIVE Final    Comment: (NOTE) The Xpert Xpress SARS-CoV-2/FLU/RSV plus assay is intended as an aid in the diagnosis of influenza from Nasopharyngeal swab specimens and should not be used as a sole basis for treatment. Nasal washings and aspirates are unacceptable for Xpert Xpress SARS-CoV-2/FLU/RSV testing.  Fact Sheet for Patients: EntrepreneurPulse.com.au  Fact  Sheet for Healthcare Providers: IncredibleEmployment.be  This test is not yet approved or cleared by the [place] FDA and has been authorized for detection and/or diagnosis of SARS-CoV-2 by FDA under an Emergency Use Authorization (EUA). This EUA will remain in effect (meaning this test can be used) for the duration of the COVID-19 declaration under Section 564(b)(1) of the Act, 21 U.S.C. section 360bbb-3(b)(1), unless the authorization is terminated or revoked.  Performed at [facility], [place] [address] [place], [place] [address]   Respiratory (~20 pathogens) panel by PCR     Status: None   Collection Time: 12/01/21 11:25 PM   Specimen: Nasopharyngeal Swab; Respiratory  Result Value Ref Range  Status   Adenovirus NOT DETECTED NOT DETECTED Final   Coronavirus 229E NOT DETECTED NOT DETECTED Final    Comment: (NOTE) The Coronavirus on the Respiratory Panel, DOES NOT test for the novel  Coronavirus (2019 nCoV)    Coronavirus HKU1 NOT DETECTED NOT DETECTED Final   Coronavirus NL63 NOT DETECTED NOT DETECTED Final   Coronavirus OC43 NOT DETECTED NOT DETECTED Final   Metapneumovirus NOT DETECTED NOT DETECTED Final   Rhinovirus / Enterovirus NOT DETECTED NOT DETECTED Final   Influenza A NOT DETECTED NOT DETECTED Final   Influenza B NOT DETECTED NOT DETECTED Final   Parainfluenza Virus 1 NOT DETECTED NOT DETECTED Final   Parainfluenza Virus 2 NOT DETECTED NOT DETECTED Final   Parainfluenza Virus 3 NOT DETECTED NOT DETECTED Final   Parainfluenza Virus 4 NOT DETECTED NOT DETECTED Final   Respiratory Syncytial Virus NOT DETECTED NOT DETECTED Final   Bordetella pertussis NOT DETECTED NOT DETECTED Final   Bordetella Parapertussis NOT DETECTED NOT DETECTED Final   Chlamydophila pneumoniae NOT DETECTED NOT DETECTED Final   Mycoplasma pneumoniae NOT DETECTED NOT DETECTED Final    Comment: Performed at [facility], [place] [address]  [place], [place] [address]  Urine Culture     Status: Abnormal   Collection Time: 12/02/21  3:00 AM   Specimen: In/Out Cath Urine  Result Value Ref Range Status   Specimen Description   Final    IN/OUT CATH URINE Performed at [place] [address], [place], [place] [address]    Special Requests   Final    NONE Performed at [facility], [place] [address], [place], [place] [address]    Culture MULTIPLE SPECIES PRESENT, SUGGEST RECOLLECTION (A)  Final   Report Status 12/03/2021 FINAL  Final  MRSA Next Gen by PCR, Nasal     Status: None   Collection Time: 12/02/21  1:04 PM   Specimen: Nasal Mucosa; Nasal Swab  Result Value Ref Range Status   MRSA by PCR Next Gen NOT DETECTED NOT DETECTED Final    Comment: (NOTE) The GeneXpert MRSA Assay (FDA approved for NASAL specimens only), is one component of a comprehensive MRSA colonization surveillance program. It is not intended to diagnose MRSA infection nor to guide or monitor treatment for MRSA infections. Test performance is not FDA approved in patients less than 64 years old. Performed at [facility], [place] [address], [place], [place] [address]          Radiology Studies: No results found.      Scheduled Meds:  Chlorhexidine Gluconate Cloth  6 each Topical Daily   insulin aspart  0-15 Units Subcutaneous TID WC   insulin aspart  0-5 Units Subcutaneous QHS   insulin aspart  4 Units Subcutaneous TID WC   insulin detemir  20 Units Subcutaneous Daily   iohexol       pantoprazole  40 mg Oral Daily   Continuous Infusions:  ceFEPime (MAXIPIME) IV 2 g (12/05/21 1005)   metronidazole Stopped (12/04/21 2311)     LOS: 3 days    Time spent: 35 minutes    Barb Merino, MD [facility] Pager [phone]

## 2021-12-05 NOTE — Progress Notes (Signed)
Pt placed on bipap due to increased RR. Pt is tolerating it well at this time.

## 2021-12-05 NOTE — Progress Notes (Signed)
[name] will order 1 unit of platelets for the patient. Patient to receive one unit of platelets once antibiotics are finished running. Nurse has not seen order yet for the platelets. Will pass message on to oncoming nurse.

## 2021-12-05 NOTE — Plan of Care (Signed)
  Problem: Metabolic: Goal: Ability to maintain appropriate glucose levels will improve Outcome: Progressing   Problem: Respiratory: Goal: Ability to maintain adequate ventilation will improve Outcome: Progressing   Problem: Coping: Goal: Level of anxiety will decrease Outcome: Progressing   Problem: Pain Managment: Goal: General experience of comfort will improve Outcome: Progressing

## 2021-12-05 NOTE — Progress Notes (Signed)
Pharmacy Antibiotic Note  Amanda Duarte is a 66 y.o. female with breast cancer on chemotherapy PTA who presented to the ED on 12/01/2021 with c/o generalized weakness and fever.  CXR on 12/01/21 showed findings with concern for PNA.  She was started on vancomycin, cefepime and flagyl on adm.  MRSA pcr came back negative with vancomycin d/ced on 12/04/21.    Today, 12/05/2021: - day #4 abx - Tmax 102.8 - WBC low, ANC 0.4 - all cultures have been negative thus far - scr 0.46 (crcl~49) - chest CT ordered for 11/12 to r/o lung abscess  Plan: - continue cefepime 2gm q12h and flagyl 500 mg q12h - f/u chest CT ___________________________________  Height: 5' (152.4 cm) Weight: 52.7 kg (116 lb 2.9 oz) IBW/kg (Calculated) : 45.5  Temp (24hrs), Avg:99.9 F (37.7 C), Min:97.2 F (36.2 C), Max:102.8 F (39.3 C)  Recent Labs  Lab 12/01/21 2313 12/02/21 0200 12/02/21 0751 12/02/21 1100 12/02/21 1921 12/03/21 0522 12/04/21 0254 12/05/21 0254  WBC 1.8*  --   --  1.9*  --  2.0* 2.1* 1.5*  CREATININE 0.60  --   --  0.60 0.53 0.61 0.52 0.46  LATICACIDVEN 8.2* 1.8 2.2*  --   --   --   --   --     Estimated Creatinine Clearance: 49.7 mL/min (by C-G formula based on SCr of 0.46 mg/dL).    Allergies  Allergen Reactions   Victoza [Liraglutide] Nausea And Vomiting     Thank you for allowing pharmacy to be a part of this patient's care.  Lynelle Doctor 12/05/2021 9:33 AM

## 2021-12-05 NOTE — Progress Notes (Addendum)
Pharmacy Antibiotic Note  Amanda Duarte is a 66 y.o. female with breast cancer on chemotherapy PTA who presented to the ED on 12/01/2021 with c/o generalized weakness and fever.  CXR on 12/01/21 showed findings with concern for PNA.  She was started on vancomycin, cefepime and flagyl on adm.  MRSA pcr came back negative with vancomycin d/ced on 12/04/21.  Now expanding coverage to vancomycin and meropenem per ID recommendations 11/12  Today, 12/05/2021: - day #4 abx - Tmax 102.9 - WBC low, ANC 0.4 - all cultures have been negative thus far - scr 0.46 (crcl~49)  Plan: - Meropenem 1g IV q8h - Resume vancomycin '750mg'$  IV q24h for estimated AUC 433, SCr rounded to 0.8, Vd 0.72 ___________________________________  Height: 5' (152.4 cm) Weight: 52.7 kg (116 lb 2.9 oz) IBW/kg (Calculated) : 45.5  Temp (24hrs), Avg:100.9 F (38.3 C), Min:97.2 F (36.2 C), Max:103.6 F (39.8 C)  Recent Labs  Lab 12/01/21 2313 12/02/21 0200 12/02/21 0751 12/02/21 1100 12/02/21 1921 12/03/21 0522 12/04/21 0254 12/05/21 0254  WBC 1.8*  --   --  1.9*  --  2.0* 2.1* 1.5*  CREATININE 0.60  --   --  0.60 0.53 0.61 0.52 0.46  LATICACIDVEN 8.2* 1.8 2.2*  --   --   --   --   --      Estimated Creatinine Clearance: 49.7 mL/min (by C-G formula based on SCr of 0.46 mg/dL).    Allergies  Allergen Reactions   Victoza [Liraglutide] Nausea And Vomiting   11/8 azithro x1 11/8 cefepime>> 11/12 11/9 vanc>>11/11, resume 11/12 >> 11/9 Flagyl>> 11/12 11/12 Meropenem >>  11/8 Covid, Influenza A/B: neg, neg 11/8 BCx x2: ngtd 11/9 UCx: multi species, suggest recollection FINAL 11/9 MRSA pcr: neg 11/8 resp panel pcr: neg  Thank you for allowing pharmacy to be a part of this patient's care.  Peggyann Juba, PharmD, BCPS Pharmacy: [phone] 12/05/2021 3:16 PM

## 2021-12-05 NOTE — Progress Notes (Addendum)
Amanda Duarte   DOB:1955/05/21   TK#:[id number]   FTD#:[id number]  Hem/onc follow up   Subjective: Patient has been having persistent fever, T103.6 this moirning, feels sleepy, had 2 loose bowel movement this morning.  No abdominal pain except when she coughs hard.  Appetite is low but she is eating.  No leg edema or other new complaints.   Objective:  Vitals:   12/05/21 1000 12/05/21 1100  BP: (!) 137/52 (!) 102/43  Pulse: (!) 117 96  Resp: (!) 31 (!) 22  Temp:    SpO2: 96% 96%    Body mass index is 22.69 kg/m.  Intake/Output Summary (Last 24 hours) at 12/05/2021 1139 Last data filed at 12/05/2021 1100 Gross per 24 hour  Intake 623.93 ml  Output 600 ml  Net 23.93 ml     Sclerae unicteric  Abdomen benign  MSK no focal spinal tenderness, no peripheral edema  Neuro nonfocal    CBG (last 3)  Recent Labs    12/04/21 1546 12/04/21 2241 12/05/21 0816  GLUCAP 206* 98 81     Labs:  Urine Studies No results for input(s): "UHGB", "CRYS" in the last 72 hours.  Invalid input(s): "UACOL", "UAPR", "USPG", "UPH", "UTP", "UGL", "UKET", "UBIL", "UNIT", "UROB", "ULEU", "UEPI", "UWBC", "URBC", "UBAC", "CAST", "UCOM", "BILUA"  Basic Metabolic Panel: Recent Labs  Lab 12/02/21 1100 12/02/21 1921 12/03/21 0522 12/04/21 0254 12/05/21 0254  NA 127* 132* 129* 132* 134*  K 3.0* 3.8 3.4* 2.8* 3.4*  CL 98 106 98 102 102  CO2 23 19* '23 24 23  '$ GLUCOSE 291* 229* 326* 57* 80  BUN '12 12 12 12 9  '$ CREATININE 0.60 0.53 0.61 0.52 0.46  CALCIUM 8.4* 8.0* 8.0* 8.3* 8.3*  MG 1.3*  --  1.9 1.7  --   PHOS  --  1.9* 2.8 2.4*  --    GFR Estimated Creatinine Clearance: 49.7 mL/min (by C-G formula based on SCr of 0.46 mg/dL). Liver Function Tests: Recent Labs  Lab 12/01/21 2313 12/02/21 1100 12/02/21 1921 12/03/21 0522 12/04/21 0254  AST 15 14*  --  18 16  ALT 10 10  --  11 9  ALKPHOS 48 48  --  44 39  BILITOT 1.0 1.6*  --  0.8 0.8  PROT 7.0 7.1  --  6.6 6.3*  ALBUMIN 2.2* 2.3* 2.3*  2.0* 2.1*   No results for input(s): "LIPASE", "AMYLASE" in the last 168 hours. No results for input(s): "AMMONIA" in the last 168 hours. Coagulation profile Recent Labs  Lab 12/01/21 2313 12/03/21 0522  INR 1.6* 1.5*    CBC: Recent Labs  Lab 12/01/21 2313 12/02/21 1100 12/03/21 0522 12/04/21 0254 12/05/21 0254  WBC 1.8* 1.9* 2.0* 2.1* 1.5*  NEUTROABS 0.8* 0.9*  --  0.6* 0.4*  HGB 6.7* 8.6* 8.1* 8.4* 8.1*  HCT 20.1* 24.4* 23.4* 24.8* 22.9*  MCV 90.1 87.1 89.0 88.9 86.4  PLT 15* 12* 33* 25* 15*   Cardiac Enzymes: No results for input(s): "CKTOTAL", "CKMB", "CKMBINDEX", "TROPONINI" in the last 168 hours. BNP: Invalid input(s): "POCBNP" CBG: Recent Labs  Lab 12/04/21 0755 12/04/21 1232 12/04/21 1546 12/04/21 2241 12/05/21 0816  GLUCAP 78 265* 206* 98 81   D-Dimer No results for input(s): "DDIMER" in the last 72 hours. Hgb A1c No results for input(s): "HGBA1C" in the last 72 hours. Lipid Profile No results for input(s): "CHOL", "HDL", "LDLCALC", "TRIG", "CHOLHDL", "LDLDIRECT" in the last 72 hours. Thyroid function studies No results for input(s): "TSH", "T4TOTAL", "T3FREE", "THYROIDAB"  in the last 72 hours.  Invalid input(s): "FREET3" Anemia work up No results for input(s): "VITAMINB12", "FOLATE", "FERRITIN", "TIBC", "IRON", "RETICCTPCT" in the last 72 hours. Microbiology Recent Results (from the past 240 hour(s))  Blood Culture (routine x 2)     Status: None (Preliminary result)   Collection Time: 12/01/21 11:13 PM   Specimen: BLOOD  Result Value Ref Range Status   Specimen Description   Final    BLOOD RIGHT ANTECUBITAL Performed at [facility] [address] [place], [place] [address]    Special Requests   Final    BOTTLES DRAWN AEROBIC AND ANAEROBIC Blood Culture results may not be optimal due to an excessive volume of blood received in culture bottles Performed at [facility] [address], [place],  [place] [address]    Culture   Final    NO GROWTH 3 DAYS Performed at [facility], [place] [address], [place], [place] [address]    Report Status PENDING  Incomplete  Blood Culture (routine x 2)     Status: None (Preliminary result)   Collection Time: 12/01/21 11:13 PM   Specimen: BLOOD  Result Value Ref Range Status   Specimen Description   Final    BLOOD SITE NOT SPECIFIED Performed at [facility] [address] [place], [place] [address]    Special Requests   Final    NONE Performed at [facility], [place] [address], [place], [place] [address]    Culture   Final    NO GROWTH 3 DAYS Performed at [facility], [place] [address] [place], [place] [address]    Report Status PENDING  Incomplete  Resp Panel by RT-PCR (Flu A&B, Covid) Anterior Nasal Swab     Status: None   Collection Time: 12/01/21 11:25 PM   Specimen: Anterior Nasal Swab  Result Value Ref Range Status   SARS Coronavirus 2 by RT PCR NEGATIVE NEGATIVE Final    Comment: (NOTE) SARS-CoV-2 target nucleic acids are NOT DETECTED.  The SARS-CoV-2 RNA is generally detectable in upper respiratory specimens during the acute phase of infection. The lowest concentration of SARS-CoV-2 viral copies this assay can detect is 138 copies/mL. A negative result does not preclude SARS-Cov-2 infection and should not be used as the sole basis for treatment or other patient management decisions. A negative result may occur with  improper specimen collection/handling, submission of specimen other than nasopharyngeal swab, presence of viral mutation(s) within the areas targeted by this assay, and inadequate number of viral copies(<138 copies/mL). A negative result must be combined with clinical observations, patient history, and epidemiological information. The expected result is Negative.  Fact Sheet for Patients:  EntrepreneurPulse.com.au  Fact Sheet for Healthcare Providers:   IncredibleEmployment.be  This test is no t yet approved or cleared by the [place] FDA and  has been authorized for detection and/or diagnosis of SARS-CoV-2 by FDA under an Emergency Use Authorization (EUA). This EUA will remain  in effect (meaning this test can be used) for the duration of the COVID-19 declaration under Section 564(b)(1) of the Act, 21 U.S.C.section 360bbb-3(b)(1), unless the authorization is terminated  or revoked sooner.       Influenza A by PCR NEGATIVE NEGATIVE Final   Influenza B by PCR NEGATIVE NEGATIVE Final    Comment: (NOTE) The Xpert Xpress SARS-CoV-2/FLU/RSV plus assay is intended as an aid in the diagnosis of influenza from Nasopharyngeal swab specimens and should not be  used as a sole basis for treatment. Nasal washings and aspirates are unacceptable for Xpert Xpress SARS-CoV-2/FLU/RSV testing.  Fact Sheet for Patients: EntrepreneurPulse.com.au  Fact Sheet for Healthcare Providers: IncredibleEmployment.be  This test is not yet approved or cleared by the [place] FDA and has been authorized for detection and/or diagnosis of SARS-CoV-2 by FDA under an Emergency Use Authorization (EUA). This EUA will remain in effect (meaning this test can be used) for the duration of the COVID-19 declaration under Section 564(b)(1) of the Act, 21 U.S.C. section 360bbb-3(b)(1), unless the authorization is terminated or revoked.  Performed at [facility], [place] [address], [place], [place] [address]   Respiratory (~20 pathogens) panel by PCR     Status: None   Collection Time: 12/01/21 11:25 PM   Specimen: Nasopharyngeal Swab; Respiratory  Result Value Ref Range Status   Adenovirus NOT DETECTED NOT DETECTED Final   Coronavirus 229E NOT DETECTED NOT DETECTED Final    Comment: (NOTE) The Coronavirus on the Respiratory Panel, DOES NOT test for the novel  Coronavirus (2019 nCoV)     Coronavirus HKU1 NOT DETECTED NOT DETECTED Final   Coronavirus NL63 NOT DETECTED NOT DETECTED Final   Coronavirus OC43 NOT DETECTED NOT DETECTED Final   Metapneumovirus NOT DETECTED NOT DETECTED Final   Rhinovirus / Enterovirus NOT DETECTED NOT DETECTED Final   Influenza A NOT DETECTED NOT DETECTED Final   Influenza B NOT DETECTED NOT DETECTED Final   Parainfluenza Virus 1 NOT DETECTED NOT DETECTED Final   Parainfluenza Virus 2 NOT DETECTED NOT DETECTED Final   Parainfluenza Virus 3 NOT DETECTED NOT DETECTED Final   Parainfluenza Virus 4 NOT DETECTED NOT DETECTED Final   Respiratory Syncytial Virus NOT DETECTED NOT DETECTED Final   Bordetella pertussis NOT DETECTED NOT DETECTED Final   Bordetella Parapertussis NOT DETECTED NOT DETECTED Final   Chlamydophila pneumoniae NOT DETECTED NOT DETECTED Final   Mycoplasma pneumoniae NOT DETECTED NOT DETECTED Final    Comment: Performed at [facility], [place]. [address], [place], [place] [address]  Urine Culture     Status: Abnormal   Collection Time: 12/02/21  3:00 AM   Specimen: In/Out Cath Urine  Result Value Ref Range Status   Specimen Description   Final    IN/OUT CATH URINE Performed at [facility] [address], [place], [place] [address]    Special Requests   Final    NONE Performed at [facility], [place] [address], [place], [place] [address]    Culture MULTIPLE SPECIES PRESENT, SUGGEST RECOLLECTION (A)  Final   Report Status 12/03/2021 FINAL  Final  MRSA Next Gen by PCR, Nasal     Status: None   Collection Time: 12/02/21  1:04 PM   Specimen: Nasal Mucosa; Nasal Swab  Result Value Ref Range Status   MRSA by PCR Next Gen NOT DETECTED NOT DETECTED Final    Comment: (NOTE) The GeneXpert MRSA Assay (FDA approved for NASAL specimens only), is one component of a comprehensive MRSA colonization surveillance program. It is not intended to diagnose MRSA infection nor to guide or monitor  treatment for MRSA infections. Test performance is not FDA approved in patients less than 2 years old. Performed at [facility], [place] [address] [place], [place] [address]       Studies:  No results found.  Assessment: 66 y.o. female   Sepsis from left lower lobe pneumonia Neutropenic fever, persistent  Pancytopenia, secondary to MDS and  chemo Hyponatremia Type 2 diabetes and hyperglycemia History of breast cancer HTN    Plan:  -Given her persistent fever in the setting of neutropenia and high risk MDS, I recommend consulting ID, she may need fungal coverage. I will discussed with ID Dr. Baxter Flattery  -repeat blood culture and obtain CT CAP to rule out abscess or other sauce of infection  -I do not have high suspicion for thrombosis or malignancy related fever as a source of her fever  -I will continue to hold GCSF due to her high risk transformation to AML  -I will order one unit plt for her today, to keep >=20K in the setting of sepsis  -pRBC if Hg<7.5, no need today  -I will f/u tomorrow    [name], MD 12/05/2021

## 2021-12-06 ENCOUNTER — Inpatient Hospital Stay (HOSPITAL_COMMUNITY): Payer: Medicare Other

## 2021-12-06 DIAGNOSIS — J189 Pneumonia, unspecified organism: Secondary | ICD-10-CM | POA: Diagnosis not present

## 2021-12-06 DIAGNOSIS — J9601 Acute respiratory failure with hypoxia: Secondary | ICD-10-CM | POA: Insufficient documentation

## 2021-12-06 DIAGNOSIS — R6521 Severe sepsis with septic shock: Secondary | ICD-10-CM

## 2021-12-06 DIAGNOSIS — D46Z Other myelodysplastic syndromes: Secondary | ICD-10-CM

## 2021-12-06 DIAGNOSIS — R6520 Severe sepsis without septic shock: Secondary | ICD-10-CM

## 2021-12-06 DIAGNOSIS — J80 Acute respiratory distress syndrome: Secondary | ICD-10-CM | POA: Diagnosis not present

## 2021-12-06 DIAGNOSIS — Z9911 Dependence on respirator [ventilator] status: Secondary | ICD-10-CM | POA: Insufficient documentation

## 2021-12-06 DIAGNOSIS — D696 Thrombocytopenia, unspecified: Secondary | ICD-10-CM | POA: Diagnosis not present

## 2021-12-06 DIAGNOSIS — A419 Sepsis, unspecified organism: Secondary | ICD-10-CM | POA: Diagnosis not present

## 2021-12-06 DIAGNOSIS — Z170 Estrogen receptor positive status [ER+]: Secondary | ICD-10-CM

## 2021-12-06 DIAGNOSIS — C50212 Malignant neoplasm of upper-inner quadrant of left female breast: Secondary | ICD-10-CM | POA: Diagnosis not present

## 2021-12-06 LAB — BODY FLUID CELL COUNT WITH DIFFERENTIAL
Eos, Fluid: 0 %
Lymphs, Fluid: 23 %
Monocyte-Macrophage-Serous Fluid: 71 % (ref 50–90)
Neutrophil Count, Fluid: 6 % (ref 0–25)
Total Nucleated Cell Count, Fluid: 13 cu mm (ref 0–1000)

## 2021-12-06 LAB — CBC WITH DIFFERENTIAL/PLATELET
Abs Immature Granulocytes: 0.12 10*3/uL — ABNORMAL HIGH (ref 0.00–0.07)
Basophils Absolute: 0 10*3/uL (ref 0.0–0.1)
Basophils Relative: 1 %
Eosinophils Absolute: 0 10*3/uL (ref 0.0–0.5)
Eosinophils Relative: 0 %
HCT: 20.1 % — ABNORMAL LOW (ref 36.0–46.0)
Hemoglobin: 7 g/dL — ABNORMAL LOW (ref 12.0–15.0)
Immature Granulocytes: 10 %
Lymphocytes Relative: 57 %
Lymphs Abs: 0.7 10*3/uL (ref 0.7–4.0)
MCH: 30.4 pg (ref 26.0–34.0)
MCHC: 34.8 g/dL (ref 30.0–36.0)
MCV: 87.4 fL (ref 80.0–100.0)
Monocytes Absolute: 0 10*3/uL — ABNORMAL LOW (ref 0.1–1.0)
Monocytes Relative: 2 %
Neutro Abs: 0.4 10*3/uL — CL (ref 1.7–7.7)
Neutrophils Relative %: 30 %
Platelets: 7 10*3/uL — CL (ref 150–400)
RBC: 2.3 MIL/uL — ABNORMAL LOW (ref 3.87–5.11)
RDW: 15.5 % (ref 11.5–15.5)
WBC: 1.3 10*3/uL — CL (ref 4.0–10.5)
nRBC: 0 % (ref 0.0–0.2)

## 2021-12-06 LAB — BLOOD GAS, ARTERIAL
Acid-base deficit: 8 mmol/L — ABNORMAL HIGH (ref 0.0–2.0)
Bicarbonate: 20.3 mmol/L (ref 20.0–28.0)
Drawn by: 56307
O2 Saturation: 97.4 %
Patient temperature: 37
pCO2 arterial: 52 mmHg — ABNORMAL HIGH (ref 32–48)
pH, Arterial: 7.2 — ABNORMAL LOW (ref 7.35–7.45)
pO2, Arterial: 87 mmHg (ref 83–108)

## 2021-12-06 LAB — BASIC METABOLIC PANEL
Anion gap: 8 (ref 5–15)
BUN: 14 mg/dL (ref 8–23)
CO2: 23 mmol/L (ref 22–32)
Calcium: 8.1 mg/dL — ABNORMAL LOW (ref 8.9–10.3)
Chloride: 105 mmol/L (ref 98–111)
Creatinine, Ser: 0.5 mg/dL (ref 0.44–1.00)
GFR, Estimated: >60 mL/min (ref >60)
Glucose, Bld: 113 mg/dL — ABNORMAL HIGH (ref 70–99)
Potassium: 3.3 mmol/L — ABNORMAL LOW (ref 3.5–5.1)
Sodium: 136 mmol/L (ref 135–145)

## 2021-12-06 LAB — EXPECTORATED SPUTUM ASSESSMENT W GRAM STAIN, RFLX TO RESP C

## 2021-12-06 LAB — GLUCOSE, CAPILLARY
Glucose-Capillary: 116 mg/dL — ABNORMAL HIGH (ref 70–99)
Glucose-Capillary: 279 mg/dL — ABNORMAL HIGH (ref 70–99)
Glucose-Capillary: 203 mg/dL — ABNORMAL HIGH (ref 70–99)
Glucose-Capillary: 203 mg/dL — ABNORMAL HIGH (ref 70–99)

## 2021-12-06 LAB — PREPARE RBC (CROSSMATCH)

## 2021-12-06 MED ORDER — FENTANYL CITRATE PF 50 MCG/ML IJ SOSY
PREFILLED_SYRINGE | INTRAMUSCULAR | Status: AC
  Filled 2021-12-06: qty 2

## 2021-12-06 MED ORDER — FENTANYL CITRATE (PF) 100 MCG/2ML IJ SOLN
100.0000 ug | Freq: Once | INTRAMUSCULAR | Status: AC
  Administered 2021-12-06: 100 ug via INTRAVENOUS

## 2021-12-06 MED ORDER — SODIUM CHLORIDE 0.9 % IV SOLN
100.0000 mg | INTRAVENOUS | Status: DC
  Administered 2021-12-06: 100 mg via INTRAVENOUS
  Filled 2021-12-06: qty 5

## 2021-12-06 MED ORDER — CITALOPRAM HYDROBROMIDE 20 MG PO TABS
20.0000 mg | ORAL_TABLET | Freq: Every day | ORAL | Status: DC
  Administered 2021-12-06: 20 mg via ORAL
  Filled 2021-12-06: qty 1

## 2021-12-06 MED ORDER — SODIUM CHLORIDE 0.9% IV SOLUTION: Freq: Once | INTRAVENOUS | Status: AC

## 2021-12-06 MED ORDER — MIDAZOLAM HCL 2 MG/2ML IJ SOLN
INTRAMUSCULAR | Status: AC
  Filled 2021-12-06: qty 2

## 2021-12-06 MED ORDER — FENTANYL CITRATE (PF) 100 MCG/2ML IJ SOLN
INTRAMUSCULAR | Status: AC
  Filled 2021-12-06: qty 2

## 2021-12-06 MED ORDER — FENTANYL BOLUS VIA INFUSION
25.0000 ug | INTRAVENOUS | Status: DC | PRN
  Administered 2021-12-06 – 2021-12-09 (×9): 50 ug via INTRAVENOUS

## 2021-12-06 MED ORDER — ORAL CARE MOUTH RINSE
15.0000 mL | OROMUCOSAL | Status: DC
  Administered 2021-12-06 – 2021-12-09 (×31): 15 mL via OROMUCOSAL

## 2021-12-06 MED ORDER — NOREPINEPHRINE 4 MG/250ML-% IV SOLN
INTRAVENOUS | Status: AC
  Administered 2021-12-06: 2 ug/min via INTRAVENOUS
  Filled 2021-12-06: qty 250

## 2021-12-06 MED ORDER — ETOMIDATE 2 MG/ML IV SOLN
INTRAVENOUS | Status: AC
  Administered 2021-12-06: 20 mg via INTRAVENOUS
  Filled 2021-12-06: qty 20

## 2021-12-06 MED ORDER — DOCUSATE SODIUM 50 MG/5ML PO LIQD
100.0000 mg | Freq: Two times a day (BID) | ORAL | Status: DC
  Administered 2021-12-06 – 2021-12-07 (×3): 100 mg
  Filled 2021-12-06 (×12): qty 10

## 2021-12-06 MED ORDER — ACETAMINOPHEN 160 MG/5ML PO SOLN
650.0000 mg | Freq: Four times a day (QID) | ORAL | Status: DC | PRN
  Administered 2021-12-06 – 2021-12-08 (×6): 650 mg
  Filled 2021-12-06 (×6): qty 20.3

## 2021-12-06 MED ORDER — MIDAZOLAM HCL 2 MG/2ML IJ SOLN
1.0000 mg | INTRAMUSCULAR | Status: DC | PRN
  Administered 2021-12-06 – 2021-12-08 (×4): 2 mg via INTRAVENOUS
  Filled 2021-12-06 (×5): qty 2

## 2021-12-06 MED ORDER — POTASSIUM CHLORIDE CRYS ER 20 MEQ PO TBCR
40.0000 meq | EXTENDED_RELEASE_TABLET | Freq: Every day | ORAL | Status: DC
  Administered 2021-12-06 – 2021-12-07 (×2): 40 meq via ORAL
  Filled 2021-12-06 (×2): qty 2

## 2021-12-06 MED ORDER — PHENYLEPHRINE 80 MCG/ML (10ML) SYRINGE FOR IV PUSH (FOR BLOOD PRESSURE SUPPORT)
PREFILLED_SYRINGE | INTRAVENOUS | Status: AC
  Filled 2021-12-06: qty 10

## 2021-12-06 MED ORDER — ETOMIDATE 2 MG/ML IV SOLN: 20.0000 mg | Freq: Once | INTRAVENOUS | Status: AC

## 2021-12-06 MED ORDER — PROSOURCE TF20 ENFIT COMPATIBL EN LIQD
60.0000 mL | Freq: Every day | ENTERAL | Status: DC
  Administered 2021-12-06 – 2021-12-07 (×2): 60 mL
  Filled 2021-12-06 (×2): qty 60

## 2021-12-06 MED ORDER — VITAL HIGH PROTEIN PO LIQD
1000.0000 mL | ORAL | Status: DC
  Administered 2021-12-06: 1000 mL

## 2021-12-06 MED ORDER — FENTANYL 2500MCG IN NS 250ML (10MCG/ML) PREMIX INFUSION
25.0000 ug/h | INTRAVENOUS | Status: DC
  Administered 2021-12-06: 25 ug/h via INTRAVENOUS
  Administered 2021-12-07 – 2021-12-09 (×3): 100 ug/h via INTRAVENOUS
  Filled 2021-12-06 (×4): qty 250

## 2021-12-06 MED ORDER — ROCURONIUM BROMIDE 10 MG/ML (PF) SYRINGE: 1.0000 mg/kg | PREFILLED_SYRINGE | Freq: Once | INTRAVENOUS | Status: AC

## 2021-12-06 MED ORDER — NOREPINEPHRINE 4 MG/250ML-% IV SOLN
0.0000 ug/min | INTRAVENOUS | Status: DC
  Administered 2021-12-07: 3 ug/min via INTRAVENOUS
  Filled 2021-12-06: qty 250

## 2021-12-06 MED ORDER — HYDROXYZINE HCL 10 MG PO TABS: 10.0000 mg | ORAL_TABLET | Freq: Three times a day (TID) | ORAL | Status: DC | PRN

## 2021-12-06 MED ORDER — POLYETHYLENE GLYCOL 3350 17 G PO PACK
17.0000 g | PACK | Freq: Every day | ORAL | Status: DC
  Administered 2021-12-06 – 2021-12-12 (×3): 17 g
  Filled 2021-12-06 (×4): qty 1

## 2021-12-06 MED ORDER — PANTOPRAZOLE SODIUM 40 MG IV SOLR
40.0000 mg | Freq: Every day | INTRAVENOUS | Status: DC
  Administered 2021-12-06 – 2021-12-08 (×3): 40 mg via INTRAVENOUS
  Filled 2021-12-06 (×3): qty 10

## 2021-12-06 MED ORDER — FENTANYL CITRATE PF 50 MCG/ML IJ SOSY
25.0000 ug | PREFILLED_SYRINGE | Freq: Once | INTRAMUSCULAR | Status: AC
  Administered 2021-12-06: 25 ug via INTRAVENOUS

## 2021-12-06 MED ORDER — "THROMBI-PAD 3""X3"" EX PADS"
1.0000 | MEDICATED_PAD | Freq: Once | CUTANEOUS | Status: DC
  Filled 2021-12-06: qty 1

## 2021-12-06 MED ORDER — SODIUM CHLORIDE 0.9 % IV SOLN
1.0000 g | Freq: Three times a day (TID) | INTRAVENOUS | Status: DC
  Administered 2021-12-07 – 2021-12-15 (×24): 1 g via INTRAVENOUS
  Filled 2021-12-06 (×26): qty 20

## 2021-12-06 MED ORDER — ROCURONIUM BROMIDE 10 MG/ML (PF) SYRINGE
PREFILLED_SYRINGE | INTRAVENOUS | Status: AC
  Administered 2021-12-06: 60 mg
  Filled 2021-12-06: qty 10

## 2021-12-06 NOTE — Procedures (Signed)
Bronchoscopy Procedure Note  RENIE STELMACH  [id number]  1955-10-08  Date:12/06/21  Time:6:18 PM   Provider Performing:Genene Kilman P Carlis Abbott   Procedure(s):  Flexible Bronchoscopy [id number])  Indication(s) Immunocompromised, pneumonia- multilobar  Consent Risks of the procedure as well as the alternatives and risks of each were explained to the patient and/or caregiver.  Consent for the procedure was obtained and is signed in the bedside chart  Anesthesia Per MAR   Time Out Verified patient identification, verified procedure, site/side was marked, verified correct patient position, special equipment/implants available, medications/allergies/relevant history reviewed, required imaging and test results available.   Sterile Technique Usual hand hygiene, masks, gowns, and gloves were used   Procedure Description Bronchoscope advanced through endotracheal tube and into airway.  Airways were examined down to subsegmental level with findings noted below.   Following diagnostic evaluation, BAL(s) performed in 30cc with normal saline and return of 15cc fluid  Findings: no secretions in airways, pink BAL fluid returned   Complications/Tolerance None; patient tolerated the procedure well. Chest X-ray is needed post procedure-- post intubation film still required.   EBL Minimal   Specimen(s) BAL for cultures  Julian Hy, DO 12/06/21 6:19 PM [facility]

## 2021-12-06 NOTE — Progress Notes (Signed)
eLink Physician-Brief Progress Note Patient Name: TINEY ZIPPER DOB: [birth year] MRN: [id number]   Date of Service  12/06/2021  HPI/Events of Note  CXR reviewed.  eICU Interventions  Central line in good position, no pneumothorax.        Frederik Pear 12/06/2021, 8:43 PM

## 2021-12-06 NOTE — Progress Notes (Signed)
[facility] for Infectious Disease  Date of Admission:  12/01/2021           Reason for visit: Follow up on multifocal pneumonia  Current antibiotics: Vancomycin Meropenem  ASSESSMENT:    66 y.o. female admitted with:  # Multifocal pneumonia with hypoxia and neutropenic fever -- admitted 12/01/21 with multifocal pneumonia not responding well to antibiotics.  Seen by ID 11/12 in consult and broadened to vancomycin and meropenem from cefepime.  Sputum cultures are pending at this time and she remains persistently febrile with hypoxia requiring Bipap and high flow nasal cannula.   # Pancytopenia secondary to MDS and chemotherapy -- remains profoundly neutropenic with ANC 0.4 and platelets 7.    # Uncontrolled DM -- A1c in September was 13.3.  # Hx of breast cancer   RECOMMENDATIONS:    Continue vancomycin and meropenem If no improvement in the next 24 hrs will likely add antifungal coverage (voriconazole) as she has had fairly significant neutropenia for the past ~ 6 weeks Consider pulmonary consult ? Possibility of DAH  Not sure she would tolerate BAL to help guide therapy Fungitell pending Check urine histo Ag Serum Aspergillus galactomannan Follow sputum cx Following   Principal Problem:   Sepsis due to pneumonia [facility]) Active Problems:   Essential hypertension   DM2 (diabetes mellitus, type 2) (HCC)   Malignant neoplasm of upper-inner quadrant of left breast in female, estrogen receptor positive (HCC)   HLD (hyperlipidemia)   Pancytopenia (HCC)   Hyponatremia   Hypokalemia   MDS (myelodysplastic syndrome), high grade (HCC)   GERD (gastroesophageal reflux disease)    MEDICATIONS:    Scheduled Meds:  sodium chloride   Intravenous Once   sodium chloride   Intravenous Once   Chlorhexidine Gluconate Cloth  6 each Topical Daily   insulin aspart  0-15 Units Subcutaneous TID WC   insulin aspart  0-5 Units Subcutaneous QHS   insulin aspart  4 Units  Subcutaneous TID WC   insulin detemir  20 Units Subcutaneous Daily   pantoprazole  40 mg Oral Daily   potassium chloride  40 mEq Oral Daily   Continuous Infusions:  meropenem (MERREM) IV Stopped (12/06/21 3785)   vancomycin Stopped (12/05/21 1755)   PRN Meds:.acetaminophen **OR** acetaminophen, albuterol, guaiFENesin, ondansetron **OR** ondansetron (ZOFRAN) IV, mouth rinse  SUBJECTIVE:     She is getting cleaned up by the nursing staff She is on Bipap She appears ill She is lying on her left side  Review of Systems  All other systems reviewed and are negative.     OBJECTIVE:   Blood pressure (!) 123/43, pulse (!) 132, temperature 100.3 F (37.9 C), temperature source Axillary, resp. rate (!) 36, height 5' (1.524 m), weight 52.7 kg, SpO2 92 %. Body mass index is 22.69 kg/m.  Physical Exam Constitutional:      Appearance: She is ill-appearing.  HENT:     Head: Normocephalic and atraumatic.  Eyes:     Extraocular Movements: Extraocular movements intact.     Conjunctiva/sclera: Conjunctivae normal.  Pulmonary:     Comments: On Bipap Increased WOB. Abdominal:     General: There is no distension.     Palpations: Abdomen is soft.  Musculoskeletal:     Cervical back: Normal range of motion and neck supple.     Right lower leg: No edema.     Left lower leg: No edema.  Skin:    General: Skin is warm and dry.  Neurological:  General: No focal deficit present.     Mental Status: She is alert. Mental status is at baseline.      Lab Results: Lab Results  Component Value Date   WBC 1.3 (LL) 12/06/2021   HGB 7.0 (L) 12/06/2021   HCT 20.1 (L) 12/06/2021   MCV 87.4 12/06/2021   PLT 7 (LL) 12/06/2021    Lab Results  Component Value Date   NA 136 12/06/2021   K 3.3 (L) 12/06/2021   CO2 23 12/06/2021   GLUCOSE 113 (H) 12/06/2021   BUN 14 12/06/2021   CREATININE 0.50 12/06/2021   CALCIUM 8.1 (L) 12/06/2021   GFRNONAA >60 12/06/2021   GFRAA >60 10/08/2019     Lab Results  Component Value Date   ALT 9 12/04/2021   AST 16 12/04/2021   ALKPHOS 39 12/04/2021   BILITOT 0.8 12/04/2021       Component Value Date/Time   CRP 21.2 (H) 12/02/2021 0907    No results found for: "ESRSEDRATE"   I have reviewed the micro and lab results in Epic.  Imaging: DG CHEST PORT 1 VIEW  Result Date: 12/06/2021 CLINICAL DATA:  Pneumonia EXAM: PORTABLE CHEST 1 VIEW COMPARISON:  Previous studies including the chest radiograph done on 12/02/2021 and CT done on 12/05/2021 FINDINGS: Transverse diameter of heart is increased. Large alveolar infiltrates are seen in left upper, mid and lower lung fields with interval worsening. There are new patchy infiltrates in right lung. Right lateral CP angle is clear. There is no pneumothorax. Surgical clips are seen in left axilla and left chest wall. IMPRESSION: Cardiomegaly. Extensive infiltrates are seen in left lung with interval progression. There are multiple patchy infiltrates in the right lung. Findings suggest interval worsening of bilateral multifocal pneumonia. Electronically Signed   By: Elmer Picker M.D.   On: 12/06/2021 09:46   CT CHEST ABDOMEN PELVIS W CONTRAST  Result Date: 12/05/2021 CLINICAL DATA:  Sepsis.  Persistent fever.  Pancytopenia. EXAM: CT CHEST, ABDOMEN, AND PELVIS WITH CONTRAST TECHNIQUE: Multidetector CT imaging of the chest, abdomen and pelvis was performed following the standard protocol during bolus administration of intravenous contrast. RADIATION DOSE REDUCTION: This exam was performed according to the departmental dose-optimization program which includes automated exposure control, adjustment of the mA and/or kV according to patient size and/or use of iterative reconstruction technique. CONTRAST:  111m OMNIPAQUE IOHEXOL 300 MG/ML  SOLN COMPARISON:  10/21/2021 FINDINGS: CT CHEST FINDINGS Cardiovascular: Heart size appears normal. No pericardial effusion. Aortic atherosclerosis.  Mediastinum/Nodes: Thyroid gland, trachea, and esophagus are unremarkable. Previous left axillary node dissection. No axillary, mediastinal, or hilar adenopathy identified. Lungs/Pleura: Severe, multifocal bilateral airspace opacities identified compatible with pneumonia. This is most severe within the left lower lobe where there is near complete consolidation. There is dense airspace consolidation noted within the posterolateral right lower lobe with overlying areas of ground-glass attenuation. Airspace disease within the inferior lingula is also identified. Patchy area of ground-glass attenuation in the left apex is unchanged from the previous exam. There is a small left pleural effusion which overlies the posterior right upper lobe and superior segment of left lower lobe. There is also a small amount of fluid identified overlying the left lung base. Musculoskeletal: Left mastectomy. No acute or suspicious osseous findings. CT ABDOMEN PELVIS FINDINGS Hepatobiliary: No focal liver abnormality is seen. No gallstones, gallbladder wall thickening, or biliary dilatation. Pancreas: Unremarkable. No pancreatic ductal dilatation or surrounding inflammatory changes. Spleen: Normal in size without focal abnormality. Adrenals/Urinary Tract: Normal adrenal glands. No  nephrolithiasis, hydronephrosis or mass. Urinary bladder appears normal. Stomach/Bowel: Stomach is within normal limits. Appendix appears normal. No evidence of bowel wall thickening, distention, or inflammatory changes. Vascular/Lymphatic: Aortic atherosclerosis. No signs of abdominopelvic adenopathy. Reproductive: Uterus and bilateral adnexa are unremarkable. Other: No free fluid or fluid collections. Musculoskeletal: No acute or significant osseous findings. Scattered subcutaneous injection sites identified within the subcutaneous soft tissues of the ventral abdominal wall. IMPRESSION: 1. Severe, multifocal bilateral airspace opacities compatible with  pneumonia. This is most severe within the left lower lobe where there is near complete airspace consolidation. 2. Small left pleural effusion. 3. No acute findings identified within the abdomen or pelvis. 4.  Aortic Atherosclerosis (ICD10-I70.0). Electronically Signed   By: Kerby Moors M.D.   On: 12/05/2021 14:14     Imaging independently reviewed in Epic.    [facility] for Infectious [facility] [phone] pager 12/06/2021, 1:17 PM

## 2021-12-06 NOTE — Progress Notes (Signed)
[facility] Progress Note Patient Name: Amanda Duarte DOB: 1955-12-05 MRN: [id number]   Date of Service  12/06/2021  HPI/Events of Note  ABG reviewed. Patient observed to be reaching for the ET tube.  eICU Interventions  Ventilator changes communicated to RT, bilateral soft wrist restraints ordered.        Frederik Pear 12/06/2021, 9:47 PM

## 2021-12-06 NOTE — Progress Notes (Signed)
Started platelet transfusion @ 1048 but IV malpositioned. Attempted x2 to get another PIV before IV team was consulted. US guided IV inserted @ 1138 and platelets restarted @ 1141. 12/06/21 12:31 PM Ventura Bruns, RN

## 2021-12-06 NOTE — Progress Notes (Signed)
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 Was in [place] until about 2d PTA developed weakness, fatigue. Family saw her yesterday, confused, febrile, called EMS. She says she has some cough but not expectorating anything. Doesn't report any other infectious sx.   Had PLT transfusion 11/4. In ED febrile and found to have LLL/lingula pneumonia, given vanc/cefepime/azithro, 2.75L LR, started on LR infusion. Transfused 1U pRBC at 0700 11/9.  Also noted to have worsening hyponatremia na 122 (corrected 124-126). Lactic 8 ->2.2, worsening pancytopenia  She has been maintained on broad-spectrum antibiotics and has been followed by infectious disease. PCCM called back on 11/13 due to progressive respiratory failure with severe hypoxia and tachypnea.  Pertinent  Medical History  HTN Anemia DM2 Breast CA MDS  Significant Hospital Events: Including procedures, antibiotic start and stop dates in addition to other pertinent events   11/9 admitted, broad spectrum antibiotics 11/12 escalated to meropenem 11/13 intubated   Interim History / Subjective:  Increasing oxygen requirements, worsening tachypneq a  Objective   Blood pressure (!) 191/91, pulse (!) 142, temperature (!) 101.8 F (38.8 C), temperature source Axillary, resp. rate (!) 40, height 5' (1.524 m), weight 52.7 kg, SpO2 98 %.    Vent Mode: PRVC FiO2 (%):  [100 %] 100 % Set Rate:  [24 bmp] 24 bmp Vt Set:  [360 mL] 360 mL PEEP:  [10 cmH20] 10 cmH20 Plateau Pressure:  [28 cmH20] 28 cmH20    Intake/Output Summary (Last 24 hours) at 12/06/2021 1827 Last data filed at 12/06/2021 1730 Gross per 24 hour  Intake 1002 ml  Output --  Net 1002 ml    Filed Weights   12/02/21 1303  Weight: 52.7 kg    Examination: General appearance: Frail ill-appearing woman lying in bed HENT: [name], eyes anicteric Neck: Mild JVD Lungs: Rales, tachypnea breathing about 45 times per minute, shallow breathing.  Coughing up thin yellow fluid.   CV: S1-S2, tachycardic, regular rhythm Abdomen: Soft, nontender, nondistended  extremities: Mild lower extremity edema Skin: Warm, dry, no diffuse rashes Psych: Cooperative with exam Neuro: Awake and alert, globally weak but moving all extremities.  Answering questions via translator  CT chest personally reviewed> severe multilobar pneumonia  Legionella 11/9 negative  WBC 1.3 H/H 7/20.1 Platelets 7 BUN 14 Cr 0.5 Culture-few WBC-few GVR, few budding yeast  Resolved Hospital Problem list   Hyponatremia  Assessment & Plan:   ARDS Acute hypoxic respiratory failure Multilobar pneumonia and immunocompromised host -Intubated for progressive respiratory failure - LTVV-current plateau 28 and driving pressure 18.  Hopefully can get driving pressure 15 or less. - VAP prevention protocol - PAD protocol for sedation - Bronch for invasive cultures given immunocompromise status.  Consent obtained verbally from the patient's family.  Sending culture for routine, AFB, fungus. -Continue broad antibiotics, adding micafungin empirically given progression and budding yeast on respiratory culture obtained on 11/12.  Hopefully this is just contaminant.  Repeat fungal cultures today. - Daily SAT and SBT as appropriate - May require prone positioning if moderate to severe ARDS.  Repeating  sepsis due to pneumonia - Continue meropenem, vancomycin.  Appreciate ID's management - Invasive cultures - Likely will require norepinephrine to maintain MAP greater than  65.  Will need central access given very poor peripheral IVs.  Family consented with understanding that she is very high risk for bleeding with only 7 platelets.  Unfortunately her only IV is tied up with sedation and norepinephrine and we cannot give additional platelets at this time.  High risk for bleeding from this line, will give additional platelets as soon as we have additional access  Hypotension; history of hypertension -Continue to hold PTA antihypertensives  Pancytopenia MDS -Needs irradiated, leukoreduced products -Appreciate oncology's management.  She is high risk of transformation to AML so not giving GM-CSF -Per oncology, platelet goal is greater than 20 -Transfuse for hemoglobin less than 7 or hemodynamically significant bleeding  Hypokalemia - Repleted - Continue to monitor  History of breast cancer  DM2 with hyperglycemia -SSI as needed - Goal blood glucose 140-180  At risk for protein energy malnutrition - Tube feeds  I updated multiple family members of Amanda Duarte.  Amanda Duarte verbally consented for intubation.  They understand that mechanical ventilation is a supportive tool, but ultimately her recovery will depend on her immune system's ability to fight this infection.  They understand her risk of bleeding is also high within basic procedures.  Best Practice (right click and "Reselect all SmartList Selections" daily)   Per primary  Labs   CBC: Recent Labs  Lab 12/01/21 2313 12/02/21 1100 12/03/21 0522 12/04/21 0254 12/05/21 0254 12/06/21 0249  WBC 1.8* 1.9* 2.0* 2.1* 1.5* 1.3*  NEUTROABS 0.8* 0.9*  --  0.6* 0.4* 0.4*  HGB 6.7* 8.6* 8.1* 8.4* 8.1* 7.0*  HCT 20.1* 24.4* 23.4* 24.8* 22.9* 20.1*  MCV 90.1 87.1 89.0 88.9 86.4 87.4  PLT 15* 12* 33* 25* 15* 7*     Basic Metabolic Panel: Recent Labs  Lab 12/02/21 1100 12/02/21 1921 12/03/21 0522 12/04/21 0254 12/05/21 0254 12/06/21 0249  NA 127* 132* 129* 132* 134* 136  K 3.0* 3.8 3.4* 2.8* 3.4*  3.3*  CL 98 106 98 102 102 105  CO2 23 19* '23 24 23 23  '$ GLUCOSE 291* 229* 326* 57* 80 113*  BUN '12 12 12 12 9 14  '$ CREATININE 0.60 0.53 0.61 0.52 0.46 0.50  CALCIUM 8.4* 8.0* 8.0* 8.3* 8.3* 8.1*  MG 1.3*  --  1.9 1.7  --   --   PHOS  --  1.9* 2.8 2.4*  --   --     GFR: Estimated Creatinine Clearance: 49.7 mL/min (by C-G formula based on SCr of 0.5 mg/dL). Recent Labs  Lab 12/01/21 2313 12/02/21 0200 12/02/21 0751 12/02/21 1100 12/03/21 0522 12/04/21 0254 12/05/21 0254 12/06/21 0249  PROCALCITON  --   --   --   --  21.18  --   --   --   WBC 1.8*  --   --    < > 2.0* 2.1* 1.5* 1.3*  LATICACIDVEN 8.2* 1.8 2.2*  --   --   --   --   --    < > = values in this interval not displayed.     Liver Function Tests: Recent Labs  Lab 12/01/21 2313 12/02/21 1100 12/02/21 1921 12/03/21 0522 12/04/21 0254  AST 15 14*  --  18 16  ALT 10 10  --  11 9  ALKPHOS 48 48  --  44 39  BILITOT 1.0 1.6*  --  0.8 0.8  PROT 7.0 7.1  --  6.6 6.3*  ALBUMIN 2.2* 2.3* 2.3* 2.0* 2.1*    No results for input(s): "LIPASE", "AMYLASE" in the last 168 hours. No results for input(s): "AMMONIA" in the last 168 hours.   This patient is critically ill with multiple organ system failure which requires frequent high complexity decision making, assessment, support, evaluation, and titration of therapies. This was completed through the application of advanced monitoring technologies and extensive interpretation of multiple databases. During this encounter critical care time was devoted to patient care services described in this note for 60 minutes.  Julian Hy, DO 12/06/21 6:37 PM [facility]

## 2021-12-06 NOTE — Progress Notes (Signed)
PROGRESS NOTE    Amanda Duarte  JSH:[id number] DOB: December 22, 1955 DOA: 12/01/2021 PCP: [name], NP    Brief Narrative:  66 year old with history of hypertension, hyperlipidemia, anxiety depression, type 2 diabetes and breast cancer and recently diagnosed myelodysplastic syndrome and pancytopenia due to MDS presented to the emergency room for fever, weakness and hyperglycemia.  Symptoms worse for 2 days.  Emergency room pancytopenic, temperature 103.  Chest x-ray with multifocal pneumonia. Remains in the hospital, persistent fever.  Negative cultures. Patient continues to have high fever and chills. 10/13, with increasing oxygen requirement.  CT scan with multifocal pneumonia.  On BiPAP overnight for tachypnea and hypoxemia.   Assessment & Plan:   Sepsis secondary to multifocal pneumonia, pancytopenia and profound neutropenia.  Patient continues to have high fever and worsening oxygen requirement.   Blood cultures negative so far.  Urine culture with multiple organisms.  Patient was on vancomycin cefepime and Flagyl, currently remains on vancomycin, meropenem and Flagyl. Repeat blood cultures drawn.  Continue chest physiotherapy as much as she is able to participate. Sputum cultures, blood cultures, Legionella and streptococcal antigen.  Negative so far. Supplemental oxygen to keep saturations more than 88%. BiPAP respiratory distress or increased work of breathing. Followed by infectious disease.  Hyponatremia with dehydration: Supportive management.  Sodium level normalizing.  Type 2 diabetes with hyperglycemia: On SSI.  Long-acting insulin resumed.  Blood sugars acceptable.  Pancytopenia with myelodysplastic syndrome: Hemoglobin 6.7-1 unit of PRBC given-8.1-7.  1 additional PRBC today. Platelets, 7000.  Transfuse 2 units of platelets today. A1c is 400.  Continues to worsen. Followed by oncology.  Not a candidate for G-CSF due to risk for transformation into AML.  GERD: On  PPI  Hypokalemia: Replaced.  Essential hypertension: Antihypertensives on hold.  Takes losartan at home.  Goal of care: Family meeting goal of care discussed on 11/12.  See notes.  Currently full code.   DVT prophylaxis: SCDs Start: 12/02/21 1022   Code Status: Full code Family Communication: None today. Disposition Plan: Status is: Inpatient Remains inpatient appropriate because: Severe sepsis, IV antibiotics     Consultants:  Pulmonary  Procedures:  None  Antimicrobials:  Vancomycin, cefepime and Flagyl 11/9----11/11 Vancomycin, meropenem 11/11----   Subjective: Patient seen and examined.  Overnight she was tachypneic, started on BiPAP.  Currently she is more lethargic.  She responds to questions but too tired to keep up conversation.  Continues to spike temperature.  No family at bedside.  Became hypoxemic on 15 L nonrebreather so had to go back on BiPAP.  Objective: Vitals:   12/06/21 0400 12/06/21 0500 12/06/21 0600 12/06/21 0800  BP: (!) 102/41 (!) 122/51 (!) 151/78   Pulse: 90 98 (!) 146   Resp: (!) 28 (!) 22 (!) 42   Temp: 99.1 F (37.3 C)   (!) 103.8 F (39.9 C)  TempSrc: Axillary   Axillary  SpO2: 98% 97% 92%   Weight:      Height:        Intake/Output Summary (Last 24 hours) at 12/06/2021 1045 Last data filed at 12/05/2021 1800 Gross per 24 hour  Intake 431.17 ml  Output --  Net 431.17 ml   Filed Weights   12/02/21 1303  Weight: 52.7 kg    Examination:  General exam: Sick looking.  Lethargic.  Responds appropriately and answer simple questions.   Respiratory system: Poor bilateral air entry.  BiPAP sounds.  Looks comfortable on 40% FiO2. Cardiovascular system: S1 & S2 heard, RRR.  Tachycardic.  No JVD, murmurs, rubs, gallops or clicks. No pedal edema. Gastrointestinal system: Abdomen is nondistended, soft and nontender. No organomegaly or masses felt. Normal bowel sounds heard. Central nervous system: Alert and oriented. No focal  neurological deficits. Extremities: Symmetric 5 x 5 power. Skin: No rashes, lesions or ulcers Psychiatry: Judgement and insight appear normal. Mood & affect appropriate.     Data Reviewed: I have personally reviewed following labs and imaging studies  CBC: Recent Labs  Lab 12/01/21 2313 12/02/21 1100 12/03/21 0522 12/04/21 0254 12/05/21 0254 12/06/21 0249  WBC 1.8* 1.9* 2.0* 2.1* 1.5* 1.3*  NEUTROABS 0.8* 0.9*  --  0.6* 0.4* 0.4*  HGB 6.7* 8.6* 8.1* 8.4* 8.1* 7.0*  HCT 20.1* 24.4* 23.4* 24.8* 22.9* 20.1*  MCV 90.1 87.1 89.0 88.9 86.4 87.4  PLT 15* 12* 33* 25* 15* 7*   Basic Metabolic Panel: Recent Labs  Lab 12/02/21 1100 12/02/21 1921 12/03/21 0522 12/04/21 0254 12/05/21 0254 12/06/21 0249  NA 127* 132* 129* 132* 134* 136  K 3.0* 3.8 3.4* 2.8* 3.4* 3.3*  CL 98 106 98 102 102 105  CO2 23 19* '23 24 23 23  '$ GLUCOSE 291* 229* 326* 57* 80 113*  BUN '12 12 12 12 9 14  '$ CREATININE 0.60 0.53 0.61 0.52 0.46 0.50  CALCIUM 8.4* 8.0* 8.0* 8.3* 8.3* 8.1*  MG 1.3*  --  1.9 1.7  --   --   PHOS  --  1.9* 2.8 2.4*  --   --    GFR: Estimated Creatinine Clearance: 49.7 mL/min (by C-G formula based on SCr of 0.5 mg/dL). Liver Function Tests: Recent Labs  Lab 12/01/21 2313 12/02/21 1100 12/02/21 1921 12/03/21 0522 12/04/21 0254  AST 15 14*  --  18 16  ALT 10 10  --  11 9  ALKPHOS 48 48  --  44 39  BILITOT 1.0 1.6*  --  0.8 0.8  PROT 7.0 7.1  --  6.6 6.3*  ALBUMIN 2.2* 2.3* 2.3* 2.0* 2.1*   No results for input(s): "LIPASE", "AMYLASE" in the last 168 hours. No results for input(s): "AMMONIA" in the last 168 hours. Coagulation Profile: Recent Labs  Lab 12/01/21 2313 12/03/21 0522  INR 1.6* 1.5*   Cardiac Enzymes: No results for input(s): "CKTOTAL", "CKMB", "CKMBINDEX", "TROPONINI" in the last 168 hours. BNP (last 3 results) No results for input(s): "PROBNP" in the last 8760 hours. HbA1C: No results for input(s): "HGBA1C" in the last 72 hours. CBG: Recent Labs  Lab  12/05/21 0816 12/05/21 1256 12/05/21 1716 12/05/21 2206 12/06/21 0806  GLUCAP 81 73 139* 86 116*   Lipid Profile: No results for input(s): "CHOL", "HDL", "LDLCALC", "TRIG", "CHOLHDL", "LDLDIRECT" in the last 72 hours. Thyroid Function Tests: No results for input(s): "TSH", "T4TOTAL", "FREET4", "T3FREE", "THYROIDAB" in the last 72 hours. Anemia Panel: No results for input(s): "VITAMINB12", "FOLATE", "FERRITIN", "TIBC", "IRON", "RETICCTPCT" in the last 72 hours. Sepsis Labs: Recent Labs  Lab 12/01/21 2313 12/02/21 0200 12/02/21 0751 12/03/21 0522  PROCALCITON  --   --   --  21.18  LATICACIDVEN 8.2* 1.8 2.2*  --     Recent Results (from the past 240 hour(s))  Blood Culture (routine x 2)     Status: None (Preliminary result)   Collection Time: 12/01/21 11:13 PM   Specimen: BLOOD  Result Value Ref Range Status   Specimen Description   Final    BLOOD RIGHT ANTECUBITAL Performed at [facility], [place] [address], [place], [place] [address]  Special Requests   Final    BOTTLES DRAWN AEROBIC AND ANAEROBIC Blood Culture results may not be optimal due to an excessive volume of blood received in culture bottles Performed at [facility] [address] [place], [place] [address]    Culture   Final    NO GROWTH 4 DAYS Performed at [facility], [place] [address] [place], [place] [address]    Report Status PENDING  Incomplete  Blood Culture (routine x 2)     Status: None (Preliminary result)   Collection Time: 12/01/21 11:13 PM   Specimen: BLOOD  Result Value Ref Range Status   Specimen Description   Final    BLOOD SITE NOT SPECIFIED Performed at [facility] [address] [place], [place] [address]    Special Requests   Final    NONE Performed at [facility], [place] [address], [place], [place] [address]    Culture   Final    NO GROWTH 4 DAYS Performed at [facility], [place] [address] [place], [place] [address]    Report Status PENDING  Incomplete  Resp Panel by RT-PCR (Flu A&B, Covid) Anterior Nasal Swab     Status: None   Collection Time: 12/01/21 11:25 PM   Specimen: Anterior Nasal Swab  Result Value Ref Range Status   SARS Coronavirus 2 by RT PCR NEGATIVE NEGATIVE Final    Comment: (NOTE) SARS-CoV-2 target nucleic acids are NOT DETECTED.  The SARS-CoV-2 RNA is generally detectable in upper respiratory specimens during the acute phase of infection. The lowest concentration of SARS-CoV-2 viral copies this assay can detect is 138 copies/mL. A negative result does not preclude SARS-Cov-2 infection and should not be used as the sole basis for treatment or other patient management decisions. A negative result may occur with  improper specimen collection/handling, submission of specimen other than nasopharyngeal swab, presence of viral mutation(s) within the areas targeted by this assay, and inadequate number of viral copies(<138 copies/mL). A negative result must be combined with clinical observations, patient history, and epidemiological information. The expected result is Negative.  Fact Sheet for Patients:  EntrepreneurPulse.com.au  Fact Sheet for Healthcare Providers:  IncredibleEmployment.be  This test is no t yet approved or cleared by the [place] FDA and  has been authorized for detection and/or diagnosis of SARS-CoV-2 by FDA under an Emergency Use Authorization (EUA). This EUA will remain  in effect (meaning this test can be used) for the duration of the COVID-19 declaration under Section 564(b)(1) of the Act, 21 U.S.C.section 360bbb-3(b)(1), unless the authorization is terminated  or revoked sooner.       Influenza A by PCR NEGATIVE NEGATIVE Final   Influenza B by PCR NEGATIVE NEGATIVE Final    Comment: (NOTE) The Xpert Xpress SARS-CoV-2/FLU/RSV plus assay is intended as an aid in the diagnosis of influenza  from Nasopharyngeal swab specimens and should not be used as a sole basis for treatment. Nasal washings and aspirates are unacceptable for Xpert Xpress SARS-CoV-2/FLU/RSV testing.  Fact Sheet for Patients: EntrepreneurPulse.com.au  Fact Sheet for Healthcare Providers: IncredibleEmployment.be  This test is not yet approved or cleared by the [place] FDA and has been authorized for detection and/or diagnosis of SARS-CoV-2 by FDA under an Emergency Use Authorization (EUA). This EUA will remain in effect (meaning this test can be used) for the duration of the COVID-19 declaration under Section 564(b)(1) of the Act, 21 U.S.C. section 360bbb-3(b)(1), unless the authorization  is terminated or revoked.  Performed at [facility], [place] [address], [place], [place] [address]   Respiratory (~20 pathogens) panel by PCR     Status: None   Collection Time: 12/01/21 11:25 PM   Specimen: Nasopharyngeal Swab; Respiratory  Result Value Ref Range Status   Adenovirus NOT DETECTED NOT DETECTED Final   Coronavirus 229E NOT DETECTED NOT DETECTED Final    Comment: (NOTE) The Coronavirus on the Respiratory Panel, DOES NOT test for the novel  Coronavirus (2019 nCoV)    Coronavirus HKU1 NOT DETECTED NOT DETECTED Final   Coronavirus NL63 NOT DETECTED NOT DETECTED Final   Coronavirus OC43 NOT DETECTED NOT DETECTED Final   Metapneumovirus NOT DETECTED NOT DETECTED Final   Rhinovirus / Enterovirus NOT DETECTED NOT DETECTED Final   Influenza A NOT DETECTED NOT DETECTED Final   Influenza B NOT DETECTED NOT DETECTED Final   Parainfluenza Virus 1 NOT DETECTED NOT DETECTED Final   Parainfluenza Virus 2 NOT DETECTED NOT DETECTED Final   Parainfluenza Virus 3 NOT DETECTED NOT DETECTED Final   Parainfluenza Virus 4 NOT DETECTED NOT DETECTED Final   Respiratory Syncytial Virus NOT DETECTED NOT DETECTED Final   Bordetella pertussis NOT DETECTED NOT  DETECTED Final   Bordetella Parapertussis NOT DETECTED NOT DETECTED Final   Chlamydophila pneumoniae NOT DETECTED NOT DETECTED Final   Mycoplasma pneumoniae NOT DETECTED NOT DETECTED Final    Comment: Performed at [facility], [place]. [address] [place], [place] [address]  Urine Culture     Status: Abnormal   Collection Time: 12/02/21  3:00 AM   Specimen: In/Out Cath Urine  Result Value Ref Range Status   Specimen Description   Final    IN/OUT CATH URINE Performed at [facility] [address] [place], [place] [address]    Special Requests   Final    NONE Performed at [facility], [place] [address] [place], [place] [address]    Culture MULTIPLE SPECIES PRESENT, SUGGEST RECOLLECTION (A)  Final   Report Status 12/03/2021 FINAL  Final  MRSA Next Gen by PCR, Nasal     Status: None   Collection Time: 12/02/21  1:04 PM   Specimen: Nasal Mucosa; Nasal Swab  Result Value Ref Range Status   MRSA by PCR Next Gen NOT DETECTED NOT DETECTED Final    Comment: (NOTE) The GeneXpert MRSA Assay (FDA approved for NASAL specimens only), is one component of a comprehensive MRSA colonization surveillance program. It is not intended to diagnose MRSA infection nor to guide or monitor treatment for MRSA infections. Test performance is not FDA approved in patients less than 59 years old. Performed at [facility], [place] [address], [place], [place] [address]   Culture, blood (Routine X 2) w Reflex to ID Panel     Status: None (Preliminary result)   Collection Time: 12/05/21 11:45 AM   Specimen: BLOOD  Result Value Ref Range Status   Specimen Description   Final    BLOOD SITE NOT SPECIFIED Performed at [facility] [address], [place], [place] [address]    Special Requests   Final    BOTTLES DRAWN AEROBIC AND ANAEROBIC Blood Culture adequate volume Performed at [place] [facility] [address],  [place], [place] [address]    Culture   Final    NO GROWTH < 24 HOURS Performed at [facility] [address], [place], [place] [address]  Report Status PENDING  Incomplete  Culture, blood (Routine X 2) w Reflex to ID Panel     Status: None (Preliminary result)   Collection Time: 12/05/21 11:45 AM   Specimen: BLOOD  Result Value Ref Range Status   Specimen Description   Final    BLOOD SITE NOT SPECIFIED Performed at [facility] [address] [place], [place] [address]    Special Requests   Final    BOTTLES DRAWN AEROBIC AND ANAEROBIC Blood Culture adequate volume Performed at [facility] [address] [place], [place] [address]    Culture   Final    NO GROWTH < 24 HOURS Performed at [facility] [address] [place], [place] [address]    Report Status PENDING  Incomplete  Expectorated Sputum Assessment w Gram Stain, Rflx to Resp Cult     Status: None   Collection Time: 12/05/21  6:35 PM   Specimen: Sputum  Result Value Ref Range Status   Specimen Description   Final    SPU Performed at [facility] [address] [place], [place] [address]    Special Requests   Final    NONE Performed at [facility], [place] [address], [place], [place] [address]    Sputum evaluation THIS SPECIMEN IS ACCEPTABLE FOR SPUTUM CULTURE  Final   Report Status 12/05/2021 FINAL  Final  Culture, Respiratory w Gram Stain     Status: None (Preliminary result)   Collection Time: 12/05/21  6:35 PM   Specimen: Sputum  Result Value Ref Range Status   Specimen Description   Final    SPU Performed at [place] [address] [place], [place] [address]    Special Requests   Final    NONE Reflexed from [id number] Performed at [facility], [place] [address], [place], [place] [address]    Gram Stain   Final    FEW WBC PRESENT, PREDOMINANTLY MONONUCLEAR FEW SQUAMOUS  EPITHELIAL CELLS PRESENT FEW GRAM VARIABLE ROD FEW BUDDING YEAST SEEN Performed at [facility], [place] [address] [place], [place] [address]    Culture PENDING  Incomplete   Report Status PENDING  Incomplete         Radiology Studies: DG CHEST PORT 1 VIEW  Result Date: 12/06/2021 CLINICAL DATA:  Pneumonia EXAM: PORTABLE CHEST 1 VIEW COMPARISON:  Previous studies including the chest radiograph done on 12/02/2021 and CT done on 12/05/2021 FINDINGS: Transverse diameter of heart is increased. Large alveolar infiltrates are seen in left upper, mid and lower lung fields with interval worsening. There are new patchy infiltrates in right lung. Right lateral CP angle is clear. There is no pneumothorax. Surgical clips are seen in left axilla and left chest wall. IMPRESSION: Cardiomegaly. Extensive infiltrates are seen in left lung with interval progression. There are multiple patchy infiltrates in the right lung. Findings suggest interval worsening of bilateral multifocal pneumonia. Electronically Signed   By: Elmer Picker M.D.   On: 12/06/2021 09:46   CT CHEST ABDOMEN PELVIS W CONTRAST  Result Date: 12/05/2021 CLINICAL DATA:  Sepsis.  Persistent fever.  Pancytopenia. EXAM: CT CHEST, ABDOMEN, AND PELVIS WITH CONTRAST TECHNIQUE: Multidetector CT imaging of the chest, abdomen and pelvis was performed following the standard protocol during bolus administration of intravenous contrast. RADIATION DOSE REDUCTION: This exam was performed according to the departmental dose-optimization program which includes automated exposure control, adjustment of the mA and/or kV according to  patient size and/or use of iterative reconstruction technique. CONTRAST:  126m OMNIPAQUE IOHEXOL 300 MG/ML  SOLN COMPARISON:  10/21/2021 FINDINGS: CT CHEST FINDINGS Cardiovascular: Heart size appears normal. No pericardial effusion. Aortic atherosclerosis. Mediastinum/Nodes: Thyroid gland, trachea, and esophagus are  unremarkable. Previous left axillary node dissection. No axillary, mediastinal, or hilar adenopathy identified. Lungs/Pleura: Severe, multifocal bilateral airspace opacities identified compatible with pneumonia. This is most severe within the left lower lobe where there is near complete consolidation. There is dense airspace consolidation noted within the posterolateral right lower lobe with overlying areas of ground-glass attenuation. Airspace disease within the inferior lingula is also identified. Patchy area of ground-glass attenuation in the left apex is unchanged from the previous exam. There is a small left pleural effusion which overlies the posterior right upper lobe and superior segment of left lower lobe. There is also a small amount of fluid identified overlying the left lung base. Musculoskeletal: Left mastectomy. No acute or suspicious osseous findings. CT ABDOMEN PELVIS FINDINGS Hepatobiliary: No focal liver abnormality is seen. No gallstones, gallbladder wall thickening, or biliary dilatation. Pancreas: Unremarkable. No pancreatic ductal dilatation or surrounding inflammatory changes. Spleen: Normal in size without focal abnormality. Adrenals/Urinary Tract: Normal adrenal glands. No nephrolithiasis, hydronephrosis or mass. Urinary bladder appears normal. Stomach/Bowel: Stomach is within normal limits. Appendix appears normal. No evidence of bowel wall thickening, distention, or inflammatory changes. Vascular/Lymphatic: Aortic atherosclerosis. No signs of abdominopelvic adenopathy. Reproductive: Uterus and bilateral adnexa are unremarkable. Other: No free fluid or fluid collections. Musculoskeletal: No acute or significant osseous findings. Scattered subcutaneous injection sites identified within the subcutaneous soft tissues of the ventral abdominal wall. IMPRESSION: 1. Severe, multifocal bilateral airspace opacities compatible with pneumonia. This is most severe within the left lower lobe where there  is near complete airspace consolidation. 2. Small left pleural effusion. 3. No acute findings identified within the abdomen or pelvis. 4.  Aortic Atherosclerosis (ICD10-I70.0). Electronically Signed   By: TKerby MoorsM.D.   On: 12/05/2021 14:14        Scheduled Meds:  sodium chloride   Intravenous Once   sodium chloride   Intravenous Once   Chlorhexidine Gluconate Cloth  6 each Topical Daily   insulin aspart  0-15 Units Subcutaneous TID WC   insulin aspart  0-5 Units Subcutaneous QHS   insulin aspart  4 Units Subcutaneous TID WC   insulin detemir  20 Units Subcutaneous Daily   pantoprazole  40 mg Oral Daily   potassium chloride  40 mEq Oral Daily   Continuous Infusions:  meropenem (MERREM) IV 1 g (12/06/21 0831)   vancomycin Stopped (12/05/21 1755)     LOS: 4 days    Time spent: 35 minutes    KBarb Merino MD [facility] Pager [phone]

## 2021-12-06 NOTE — Procedures (Signed)
Intubation Procedure Note  MIRINDA MONTE  [id number]  November 05, 1955  Date:12/06/21  Time:6:17 PM   Provider Performing:Kenzlei Runions Naomie Dean    Procedure: Intubation (31500)  Indication(s) Respiratory Failure  Consent Risks of the procedure as well as the alternatives and risks of each were explained to the patient and/or caregiver.  Consent for the procedure was obtained and is signed in the bedside chart   Anesthesia Etomidate and Rocuronium   Time Out Verified patient identification, verified procedure, site/side was marked, verified correct patient position, special equipment/implants available, medications/allergies/relevant history reviewed, required imaging and test results available.   Sterile Technique Usual hand hygeine, masks, and gloves were used   Procedure Description Patient positioned in bed supine.  Sedation given as noted above.  Patient was intubated with endotracheal tube using Glidescope.  View was Grade 1 full glottis .  Number of attempts was 1.  Colorimetric CO2 detector was consistent with tracheal placement.   Complications/Tolerance None; patient tolerated the procedure well. Chest X-ray is ordered to verify placement.   EBL Minimal   Specimen(s) None   Julian Hy, DO 12/06/21 6:17 PM [facility]

## 2021-12-06 NOTE — Procedures (Signed)
OGT placed post intubation. Confirmed gastric sounds. KUB pending.  Julian Hy, DO 12/06/21 6:39 PM [facility]

## 2021-12-06 NOTE — Significant Event (Signed)
Patient continues to have significant respiratory distress, persistent fever, large amount of oral secretions and unable to use BiPAP.  Case was discussed with intensive care unit and seen in consultation.  By the time this note was created, patient was found with severe respite distress and getting intubated.  May benefit with bronchoalveolar lavage and cultures.  I have updated patient's daughters outside the unit.  Appreciate critical care involvement. Patient will remain with critical care services.

## 2021-12-06 NOTE — Procedures (Signed)
Central Venous Catheter Insertion Procedure Note  Amanda Duarte  [id number]  Sep 04, 1955  Date:12/06/21  Time:7:57 PM   Provider Performing:Telisha Zawadzki Alfredo Martinez   Procedure: Insertion of Non-tunneled Central Venous [phone]) with US guidance ([id number])   Indication(s) Difficult access  Consent Risks of the procedure as well as the alternatives and risks of each were explained to the patient and/or caregiver.  Consent for the procedure was obtained and is signed in the bedside chart.  Particular detail given to risk of bleeding with family given patients low platelets. Prior platelet administration during the day.   Anesthesia Topical only with 1% lidocaine   Timeout Verified patient identification, verified procedure, site/side was marked, verified correct patient position, special equipment/implants available, medications/allergies/relevant history reviewed, required imaging and test results available.  Sterile Technique Maximal sterile technique including full sterile barrier drape, hand hygiene, sterile gown, sterile gloves, mask, hair covering, sterile ultrasound probe cover (if used).  Procedure Description Area of catheter insertion was cleaned with chlorhexidine and draped in sterile fashion.  With real-time ultrasound guidance a central venous catheter was placed into the left internal jugular vein. Catheter placed to 18 cm. Nonpulsatile blood flow and easy flushing noted in all ports.  The catheter was sutured in place and sterile dressing applied.   Guidewire in vessel.   Complications/Tolerance None; patient tolerated the procedure well. Chest X-ray is ordered to verify placement for internal jugular or subclavian cannulation.   Chest x-ray is not ordered for femoral cannulation.  EBL Minimal  Specimen(s) None   Noe Gens, MSN, APRN, NP-C, AGACNP-BC [facility] 12/06/2021, 7:58 PM   Please see [email or web address] for pager details.   From 7A-7P if no  response, please call [phone] After hours, please call ELink [phone]

## 2021-12-06 NOTE — Progress Notes (Signed)
ET tube pulled back per order.

## 2021-12-07 ENCOUNTER — Inpatient Hospital Stay (HOSPITAL_COMMUNITY): Payer: Medicare Other

## 2021-12-07 ENCOUNTER — Encounter (HOSPITAL_COMMUNITY): Payer: Self-pay | Admitting: Internal Medicine

## 2021-12-07 ENCOUNTER — Other Ambulatory Visit: Payer: Self-pay

## 2021-12-07 DIAGNOSIS — J690 Pneumonitis due to inhalation of food and vomit: Secondary | ICD-10-CM | POA: Insufficient documentation

## 2021-12-07 DIAGNOSIS — R509 Fever, unspecified: Secondary | ICD-10-CM

## 2021-12-07 DIAGNOSIS — C50212 Malignant neoplasm of upper-inner quadrant of left female breast: Secondary | ICD-10-CM | POA: Diagnosis not present

## 2021-12-07 DIAGNOSIS — J80 Acute respiratory distress syndrome: Secondary | ICD-10-CM | POA: Diagnosis not present

## 2021-12-07 DIAGNOSIS — J9601 Acute respiratory failure with hypoxia: Secondary | ICD-10-CM | POA: Diagnosis not present

## 2021-12-07 DIAGNOSIS — E440 Moderate protein-calorie malnutrition: Secondary | ICD-10-CM | POA: Diagnosis present

## 2021-12-07 DIAGNOSIS — D61818 Other pancytopenia: Secondary | ICD-10-CM

## 2021-12-07 DIAGNOSIS — D46Z Other myelodysplastic syndromes: Secondary | ICD-10-CM | POA: Diagnosis not present

## 2021-12-07 DIAGNOSIS — J189 Pneumonia, unspecified organism: Secondary | ICD-10-CM | POA: Diagnosis present

## 2021-12-07 DIAGNOSIS — A419 Sepsis, unspecified organism: Secondary | ICD-10-CM | POA: Diagnosis not present

## 2021-12-07 DIAGNOSIS — J181 Lobar pneumonia, unspecified organism: Secondary | ICD-10-CM | POA: Diagnosis not present

## 2021-12-07 LAB — ECHOCARDIOGRAM COMPLETE
AR max vel: 1.43 cm2
AV Area VTI: 1.4 cm2
AV Area mean vel: 1.43 cm2
AV Mean grad: 5 mmHg
AV Peak grad: 8.5 mmHg
Ao pk vel: 1.46 m/s
Area-P 1/2: 3.53 cm2
Calc EF: 41.8 %
Height: 60 in
S' Lateral: 3.2 cm
Single Plane A2C EF: 43.2 %
Single Plane A4C EF: 40.1 %
Weight: 1911.83 oz

## 2021-12-07 LAB — BASIC METABOLIC PANEL
Anion gap: 15 (ref 5–15)
Anion gap: 10 (ref 5–15)
BUN: 28 mg/dL — ABNORMAL HIGH (ref 8–23)
BUN: 42 mg/dL — ABNORMAL HIGH (ref 8–23)
CO2: 18 mmol/L — ABNORMAL LOW (ref 22–32)
CO2: 23 mmol/L (ref 22–32)
Calcium: 8.1 mg/dL — ABNORMAL LOW (ref 8.9–10.3)
Calcium: 7.7 mg/dL — ABNORMAL LOW (ref 8.9–10.3)
Chloride: 102 mmol/L (ref 98–111)
Chloride: 105 mmol/L (ref 98–111)
Creatinine, Ser: 0.87 mg/dL (ref 0.44–1.00)
Creatinine, Ser: 0.81 mg/dL (ref 0.44–1.00)
GFR, Estimated: >60 mL/min (ref >60)
GFR, Estimated: >60 mL/min (ref >60)
Glucose, Bld: 193 mg/dL — ABNORMAL HIGH (ref 70–99)
Glucose, Bld: 324 mg/dL — ABNORMAL HIGH (ref 70–99)
Potassium: 4.3 mmol/L (ref 3.5–5.1)
Potassium: 3.8 mmol/L (ref 3.5–5.1)
Sodium: 135 mmol/L (ref 135–145)
Sodium: 138 mmol/L (ref 135–145)

## 2021-12-07 LAB — BLOOD GAS, ARTERIAL
Acid-base deficit: 2.6 mmol/L — ABNORMAL HIGH (ref 0.0–2.0)
Acid-base deficit: 7.5 mmol/L — ABNORMAL HIGH (ref 0.0–2.0)
Bicarbonate: 18.2 mmol/L — ABNORMAL LOW (ref 20.0–28.0)
Bicarbonate: 21.8 mmol/L (ref 20.0–28.0)
Drawn by: 56037
FIO2: 100 %
FIO2: 50 %
MECHVT: 360 mL
MECHVT: 360 mL
O2 Content: 50 L/min
O2 Saturation: 98 %
O2 Saturation: 98.7 %
PEEP: 12 cmH2O
PEEP: 12 cmH2O
Patient temperature: 36.5
Patient temperature: 37
RATE: 30 resp/min
RATE: 30 resp/min
pCO2 arterial: 36 mmHg (ref 32–48)
pCO2 arterial: 36 mmHg (ref 32–48)
pH, Arterial: 7.31 — ABNORMAL LOW (ref 7.35–7.45)
pH, Arterial: 7.39 (ref 7.35–7.45)
pO2, Arterial: 101 mmHg (ref 83–108)
pO2, Arterial: 274 mmHg — ABNORMAL HIGH (ref 83–108)

## 2021-12-07 LAB — TYPE AND SCREEN
ABO/RH(D): O POS
Antibody Screen: NEGATIVE
Unit division: 0

## 2021-12-07 LAB — BPAM PLATELET PHERESIS
Blood Product Expiration Date: 202311162359
Blood Product Expiration Date: 202311162359
ISSUE DATE / TIME: 202311131040
ISSUE DATE / TIME: 202311132333
Unit Type and Rh: 5100
Unit Type and Rh: 6200

## 2021-12-07 LAB — GLUCOSE, CAPILLARY
Glucose-Capillary: 173 mg/dL — ABNORMAL HIGH (ref 70–99)
Glucose-Capillary: 179 mg/dL — ABNORMAL HIGH (ref 70–99)
Glucose-Capillary: 241 mg/dL — ABNORMAL HIGH (ref 70–99)
Glucose-Capillary: 318 mg/dL — ABNORMAL HIGH (ref 70–99)
Glucose-Capillary: 318 mg/dL — ABNORMAL HIGH (ref 70–99)
Glucose-Capillary: 326 mg/dL — ABNORMAL HIGH (ref 70–99)

## 2021-12-07 LAB — CBC WITH DIFFERENTIAL/PLATELET
Abs Immature Granulocytes: 0.15 10*3/uL — ABNORMAL HIGH (ref 0.00–0.07)
Basophils Absolute: 0 10*3/uL (ref 0.0–0.1)
Basophils Relative: 1 %
Eosinophils Absolute: 0 10*3/uL (ref 0.0–0.5)
Eosinophils Relative: 0 %
HCT: 26.8 % — ABNORMAL LOW (ref 36.0–46.0)
Hemoglobin: 8.7 g/dL — ABNORMAL LOW (ref 12.0–15.0)
Immature Granulocytes: 9 %
Lymphocytes Relative: 59 %
Lymphs Abs: 0.9 10*3/uL (ref 0.7–4.0)
MCH: 30.3 pg (ref 26.0–34.0)
MCHC: 32.5 g/dL (ref 30.0–36.0)
MCV: 93.4 fL (ref 80.0–100.0)
Monocytes Absolute: 0.1 10*3/uL (ref 0.1–1.0)
Monocytes Relative: 3 %
Neutro Abs: 0.5 10*3/uL — ABNORMAL LOW (ref 1.7–7.7)
Neutrophils Relative %: 28 %
Platelets: 34 10*3/uL — ABNORMAL LOW (ref 150–400)
RBC: 2.87 MIL/uL — ABNORMAL LOW (ref 3.87–5.11)
RDW: 15.4 % (ref 11.5–15.5)
WBC: 1.6 10*3/uL — ABNORMAL LOW (ref 4.0–10.5)
nRBC: 0 % (ref 0.0–0.2)

## 2021-12-07 LAB — PREPARE PLATELET PHERESIS
Unit division: 0
Unit division: 0

## 2021-12-07 LAB — CULTURE, BLOOD (ROUTINE X 2)
Culture: NO GROWTH
Culture: NO GROWTH

## 2021-12-07 LAB — BPAM RBC
Blood Product Expiration Date: 202312102359
ISSUE DATE / TIME: 202311131533
Unit Type and Rh: 5100

## 2021-12-07 LAB — MAGNESIUM
Magnesium: 2 mg/dL (ref 1.7–2.4)
Magnesium: 2 mg/dL (ref 1.7–2.4)

## 2021-12-07 LAB — PHOSPHORUS: Phosphorus: 6 mg/dL — ABNORMAL HIGH (ref 2.5–4.6)

## 2021-12-07 MED ORDER — AMIODARONE LOAD VIA INFUSION
150.0000 mg | Freq: Once | INTRAVENOUS | Status: AC
  Administered 2021-12-07: 150 mg via INTRAVENOUS
  Filled 2021-12-07: qty 83.34

## 2021-12-07 MED ORDER — CITALOPRAM HYDROBROMIDE 20 MG PO TABS
20.0000 mg | ORAL_TABLET | Freq: Every day | ORAL | Status: DC
  Administered 2021-12-07: 20 mg
  Filled 2021-12-07: qty 1

## 2021-12-07 MED ORDER — VORICONAZOLE 200 MG IV SOLR
200.0000 mg | Freq: Two times a day (BID) | INTRAVENOUS | Status: DC
  Filled 2021-12-07: qty 200

## 2021-12-07 MED ORDER — OSMOLITE 1.2 CAL PO LIQD
1000.0000 mL | ORAL | Status: DC
  Administered 2021-12-07: 1000 mL
  Filled 2021-12-07 (×2): qty 1000

## 2021-12-07 MED ORDER — SODIUM CHLORIDE 0.9% IV SOLUTION: Freq: Once | INTRAVENOUS | Status: DC

## 2021-12-07 MED ORDER — VANCOMYCIN HCL 750 MG/150ML IV SOLN
750.0000 mg | INTRAVENOUS | Status: DC
  Administered 2021-12-07 – 2021-12-08 (×2): 750 mg via INTRAVENOUS
  Filled 2021-12-07 (×2): qty 150

## 2021-12-07 MED ORDER — INSULIN ASPART 100 UNIT/ML IJ SOLN
0.0000 [IU] | INTRAMUSCULAR | Status: DC
  Administered 2021-12-07: 11 [IU] via SUBCUTANEOUS
  Administered 2021-12-07: 3 [IU] via SUBCUTANEOUS
  Administered 2021-12-07: 5 [IU] via SUBCUTANEOUS
  Administered 2021-12-07: 8 [IU] via SUBCUTANEOUS
  Administered 2021-12-07 – 2021-12-08 (×4): 11 [IU] via SUBCUTANEOUS

## 2021-12-07 MED ORDER — VORICONAZOLE 200 MG IV SOLR
300.0000 mg | Freq: Two times a day (BID) | INTRAVENOUS | Status: AC
  Administered 2021-12-07 (×2): 300 mg via INTRAVENOUS
  Filled 2021-12-07 (×2): qty 300

## 2021-12-07 MED ORDER — CALCIUM GLUCONATE-NACL 1-0.675 GM/50ML-% IV SOLN
1.0000 g | Freq: Once | INTRAVENOUS | Status: AC
  Administered 2021-12-07: 1000 mg via INTRAVENOUS
  Filled 2021-12-07: qty 50

## 2021-12-07 MED ORDER — PROSOURCE TF20 ENFIT COMPATIBL EN LIQD
60.0000 mL | Freq: Two times a day (BID) | ENTERAL | Status: DC
  Administered 2021-12-07 – 2021-12-09 (×4): 60 mL
  Filled 2021-12-07 (×7): qty 60

## 2021-12-07 MED ORDER — AMIODARONE HCL IN DEXTROSE 360-4.14 MG/200ML-% IV SOLN
60.0000 mg/h | INTRAVENOUS | Status: DC
  Administered 2021-12-07 (×2): 60 mg/h via INTRAVENOUS
  Filled 2021-12-07: qty 200

## 2021-12-07 MED ORDER — MAGNESIUM SULFATE 2 GM/50ML IV SOLN
2.0000 g | Freq: Once | INTRAVENOUS | Status: AC
  Administered 2021-12-07: 2 g via INTRAVENOUS
  Filled 2021-12-07: qty 50

## 2021-12-07 MED ORDER — INSULIN ASPART 100 UNIT/ML IJ SOLN
2.0000 [IU] | INTRAMUSCULAR | Status: DC
  Administered 2021-12-07 – 2021-12-08 (×4): 2 [IU] via SUBCUTANEOUS

## 2021-12-07 MED ORDER — SODIUM CHLORIDE 0.9 % IV SOLN: INTRAVENOUS | Status: DC | PRN

## 2021-12-07 MED ORDER — AMIODARONE HCL IN DEXTROSE 360-4.14 MG/200ML-% IV SOLN
30.0000 mg/h | INTRAVENOUS | Status: DC
  Administered 2021-12-07 (×2): 30 mg/h via INTRAVENOUS
  Filled 2021-12-07 (×2): qty 200

## 2021-12-07 NOTE — Progress Notes (Signed)
  Echocardiogram 2D Echocardiogram has been performed.  Lana Fish 12/07/2021, 12:40 PM

## 2021-12-07 NOTE — Plan of Care (Signed)
Discussed earlier in front of patient with family plan of care for the evening after intubation, foley insertion and medications with some teach back displayed by daughter.  Problem: Education: Goal: Ability to describe self-care measures that may prevent or decrease complications (Diabetes Survival Skills Education) will improve Outcome: Not Progressing   Problem: Coping: Goal: Ability to adjust to condition or change in health will improve Outcome: Not Progressing

## 2021-12-07 NOTE — Plan of Care (Signed)
Discussed with family in front of patient plan of care for the evening with both nurses, pain management and vital signs with some teach back displayed by the family.  Problem: Education: Goal: Ability to describe self-care measures that may prevent or decrease complications (Diabetes Survival Skills Education) will improve Outcome: Progressing   Problem: Coping: Goal: Ability to adjust to condition or change in health will improve Outcome: Progressing

## 2021-12-07 NOTE — Progress Notes (Signed)
Pharmacy Antibiotic Note  Amanda Duarte is a 66 y.o. female admitted on 12/01/2021 with fatigue/SOB. Pharmacy has been consulted to adjust Micafungin to Voriconazole to cover for both candida and aspergillus empirically.   Plan: - D/c Micafungin - Start Voriconazole 300 mg IV every 12 hours x 2 doses followed by 200 mg IV every 12 hours - Continue Vancomycin 750 mg IV every 24 hours, will check levels at steady state if continues - Continue Meropenem 1g IV every 8 hours - Will continue to follow renal function, culture results, LOT, and antibiotic de-escalation plans   Height: 5' (152.4 cm) Weight: 54.2 kg (119 lb 7.8 oz) (56-1.8) IBW/kg (Calculated) : 45.5  Temp (24hrs), Avg:98.5 F (36.9 C), Min:96.4 F (35.8 C), Max:101.8 F (38.8 C)  Recent Labs  Lab 12/01/21 2313 12/02/21 0200 12/02/21 0751 12/02/21 1100 12/03/21 0522 12/04/21 0254 12/05/21 0254 12/06/21 0249 12/07/21 0245  WBC 1.8*  --   --    < > 2.0* 2.1* 1.5* 1.3* 1.6*  CREATININE 0.60  --   --    < > 0.61 0.52 0.46 0.50 0.87  LATICACIDVEN 8.2* 1.8 2.2*  --   --   --   --   --   --    < > = values in this interval not displayed.    Estimated Creatinine Clearance: 45.7 mL/min (by C-G formula based on SCr of 0.87 mg/dL).    Allergies  Allergen Reactions   Victoza [Liraglutide] Nausea And Vomiting    Antimicrobials this admission: Azithromycin 11/8 x 1 Cefepime 11/8 >> 11/12 Flagyl 11/9 >> 11/12 Vancomycin 11/9 >>  Meropenem 11/12 >> Micafungin 11/13 x 1 Voriconazole 11/14 >>  Dose adjustments this admission:   Microbiology results: 11/8 Influenza/COVID >> neg 11/8 RVP >> neg 11/9 MRSA PCR >> neg  Thank you for allowing pharmacy to be a part of this patient's care.  Alycia Rossetti, PharmD, BCPS Infectious Diseases Clinical Pharmacist 12/07/2021 1:25 PM   **Pharmacist phone directory can now be found on [email or web address] ([email or web address]).  Listed under [facility].

## 2021-12-07 NOTE — Progress Notes (Signed)
Tele reviewed>  torsades.  2g Mg+ now, agree with checking lytes EKG now Review meds-- voriconazole may not work here. To be discussed with ID.  Con't monitoring on tele Echo-- worry about chemo-induced CM  Julian Hy, DO 12/07/21 11:31 AM [facility]

## 2021-12-07 NOTE — Progress Notes (Signed)
[facility] Progress Note Patient Name: Amanda Duarte DOB: 03-22-1955 MRN: [id number]   Date of Service  12/07/2021  HPI/Events of Note  Patient in atrial fibrillation with RVR,  she is vented on Norepinephrine and Fentanyl gtt. QTc 383. BP 99/59, MAP 68.  eICU Interventions  Amiodarone bolus + gtt ordered.        Kerry Kass Myrakle Wingler 12/07/2021, 12:11 AM

## 2021-12-07 NOTE — Progress Notes (Addendum)
Initial Nutrition Assessment  DOCUMENTATION CODES:   Non-severe (moderate) malnutrition in context of chronic illness  INTERVENTION:  -Transition EN to Osmolite 1.2 @ 24m/hr x 24hrs (10835mtotal volume) with 6031mrosource TF20 BID to provide 1456kcal, 100g protein and 886m57mee water. -Monitor lytes and replete prn -Recommend insulin adjustments for tighter BG control with EN  NUTRITION DIAGNOSIS:  Moderate Malnutrition related to chronic illness as evidenced by energy intake < or equal to 75% for > or equal to 1 month, mild fat depletion, mild muscle depletion.  GOAL:  Patient will meet greater than or equal to 90% of their needs  MONITOR:  TF tolerance, Labs, Vent status  REASON FOR ASSESSMENT:  Consult Enteral/tube feeding initiation and management  ASSESSMENT:  Pt is a 66yo107yoith PMH of HTN, HLD, anemia, anxiety/depression, DM2, and breast cancer who presents with fever due to sepsis.  Visited pt at bedside, with daughter present. She reports pt has been losing weight recently with treatment for MDS. She is unable to quantify weight loss and time frame. Pt with decreased po intake since October. Refusing many protein sources such as pork, beef and chicken. But will accept beans and seafood. Po intake likely meeting <75% estimated nutrient needs for >1 month. NFPE shows mild muscle depletion and mild fat loss. Pt meets ASPEN criteria for moderate protein calorie malnutrition r/t chronic illness.   Pt required intubation on 11/13. Started on pepup protocol with Vital HP @ 40mL66mand Prosource q day. Needs assessed with current Tmax and Ve. Current formula unable to meet estimated calorie needs without providing excessive protein. Recommend providing Osmolite 1.2 @ 45mL/42m/ 60mL P43murce TF20 BID to provide 1456kcal, 100g protein and 886mL fr60mater. BG has been elevated, recommend insulin adjustments as fiber containing formula not appropriate with levophed.  Medications  reviewed and include: colace, vital HP + Prosource TF20, novolog, levemir, protonix, miralax, KCl, fentanyl, levophed, vancomycin,   Labs reviewed: BG:173-241, Phos:6.0, GFR>60   NUTRITION - FOCUSED PHYSICAL EXAM:  Flowsheet Row Most Recent Value  Orbital Region Mild depletion  Upper Arm Region Moderate depletion  Thoracic and Lumbar Region Mild depletion  Buccal Region Unable to assess  [vent]  Temple Region Moderate depletion  Clavicle Bone Region No depletion  Clavicle and Acromion Bone Region No depletion  Scapular Bone Region Unable to assess  Dorsal Hand Mild depletion  Patellar Region Mild depletion  Anterior Thigh Region Mild depletion  Posterior Calf Region Mild depletion  Hair Reviewed  Eyes Reviewed  Mouth Reviewed  Skin Reviewed  Nails Reviewed       Diet Order:   Diet Order             Diet NPO time specified  Diet effective now                   EDUCATION NEEDS:   Not appropriate for education at this time  Skin:  Skin Assessment: Reviewed RN Assessment  Last BM:  11/13  Height:  Ht Readings from Last 1 Encounters:  12/02/21 5' (1.524 m)   Weight:  Wt Readings from Last 1 Encounters:  12/07/21 54.2 kg    BMI:  Body mass index is 23.34 kg/m.  Estimated Nutritional Needs:  Kcal:  1355-1630kcal ([employer] Equation 1458kcal) Protein:  80-110g (1.5-2.0g/kg) Fluid:  1355-1630mL  Ka31mBusCandise Bowens LDN, CNSC See AMiON for contact information

## 2021-12-07 NOTE — Progress Notes (Signed)
[facility] for Infect[facility]  Date of Admission:  12/01/2021           Reason for visit: Follow up on multifocal pneumonia, neutropenic fever  Current antibiotics: Vancomycin Meropenem Micafungin   ASSESSMENT:    66 y.o. female admitted with:  # Multifocal pneumonia with hypoxia and neutropenic fever, ARDS -- admitted 12/01/21 with multifocal pneumonia not responding well to antibiotics.  Seen by PCCM early in admission and noted to be on room air 12/03/21.  She continued to decline however with worsening oxygen requirements.  Seen by ID 11/12 in consult and broadened to meropenem from cefepime.  She was kept on vancomycin given instability despite MRSA PCR being negative.  Sputum cultures pending (Gram stain polymicrobial).  She was intubated on 11/13 in the evening due to progressive hypoxia and increased WOB.  BAL cultures are pending.  There was no evidence of DAH at the time of bronchoscopy.   # Pancytopenia secondary to MDS and chemotherapy -- remains profoundly neutropenic and thrombocytopenic.  Slightly improved today.    # Uncontrolled DM -- A1c in September was 13.3.    RECOMMENDATIONS:    Seems like this could be consistent with aspiration leading to further rapid decline Continue vancomycin and meropenem Add voriconazole for anti-fungal/mold coverage Stop micafungin Follow BAL cultures Fungitell, urine Histo pending Will attempt to add Aspergillus Ag to BAL studies ? Possibility of leukemic infiltrates Following   Principal Problem:   Sepsis with acute organ dysfunction without septic shock (HCC) Active Problems:   Essential hypertension   DM2 (diabetes mellitus, type 2) (HCC)   Malignant neoplasm of upper-inner quadrant of left breast in female, estrogen receptor positive (HCC)   HLD (hyperlipidemia)   Pancytopenia (HCC)   Hyponatremia   Hypokalemia   MDS (myelodysplastic syndrome), high grade (HCC)   GERD (gastroesophageal reflux disease)    Acute respiratory failure with hypoxia (HCC)   On mechanically assisted ventilation (HCC)   ARDS (adult respiratory distress syndrome) (HCC)   Septic shock (HCC)    MEDICATIONS:    Scheduled Meds:  sodium chloride   Intravenous Once   Chlorhexidine Gluconate Cloth  6 each Topical Daily   citalopram  20 mg Oral Daily   docusate  100 mg Per Tube BID   feeding supplement (PROSource TF20)  60 mL Per Tube Daily   feeding supplement (VITAL HIGH PROTEIN)  1,000 mL Per Tube Q24H   insulin aspart  0-15 Units Subcutaneous Q4H   insulin detemir  20 Units Subcutaneous Daily   mouth rinse  15 mL Mouth Rinse Q2H   pantoprazole (PROTONIX) IV  40 mg Intravenous Daily   polyethylene glycol  17 g Per Tube Daily   potassium chloride  40 mEq Oral Daily   Thrombi-Pad  1 each Topical Once   Continuous Infusions:  sodium chloride 10 mL/hr at 12/07/21 0640   amiodarone 30 mg/hr (12/07/21 0732)   fentaNYL infusion INTRAVENOUS 100 mcg/hr (12/07/21 0640)   meropenem (MERREM) IV Stopped (12/07/21 0639)   micafungin (MYCAMINE) 100 mg in sodium chloride 0.9 % 100 mL IVPB Stopped (12/06/21 2328)   norepinephrine (LEVOPHED) Adult infusion 3 mcg/min (12/07/21 0640)   vancomycin     PRN Meds:.sodium chloride, acetaminophen (TYLENOL) oral liquid 160 mg/5 mL, acetaminophen **OR** acetaminophen, albuterol, fentaNYL, guaiFENesin, hydrOXYzine, midazolam, ondansetron **OR** ondansetron (ZOFRAN) IV, mouth rinse  SUBJECTIVE:   24 hour events:  She was intubated late yesterday Remains febrile No evidence of DAH on bronchoscopy Started  on micafungin plus kept on Meropenem and Vancomycin Central lines placed ANC and Platelets slightly improved today Sputum cx is pending, gram stain polymicrobial BAL Cx pending Foley placed  Intubated and sedated  Review of Systems  Unable to perform ROS: Intubated      OBJECTIVE:   Blood pressure (!) 123/53, pulse (!) 102, temperature (!) 100.6 F (38.1 C), resp. rate  (!) 33, height 5' (1.524 m), weight 54.2 kg, SpO2 98 %. Body mass index is 23.34 kg/m.  Physical Exam Constitutional:      Comments: Appears comfortable on current sedation  HENT:     Head: Normocephalic and atraumatic.  Eyes:     Comments: Eyes are closed.   Neck:     Comments: ET tube in place. Left IJ CVC in place.  Cardiovascular:     Rate and Rhythm: Tachycardia present.  Pulmonary:     Comments: Ventilated breath sounds, diminished on left.  Abdominal:     General: There is no distension.     Palpations: Abdomen is soft.     Tenderness: There is no abdominal tenderness.  Musculoskeletal:     Right lower leg: No edema.     Left lower leg: No edema.  Neurological:     Comments: Sedated.      Lab Results: Lab Results  Component Value Date   WBC 1.6 (L) 12/07/2021   HGB 8.7 (L) 12/07/2021   HCT 26.8 (L) 12/07/2021   MCV 93.4 12/07/2021   PLT 34 (L) 12/07/2021    Lab Results  Component Value Date   NA 135 12/07/2021   K 4.3 12/07/2021   CO2 18 (L) 12/07/2021   GLUCOSE 193 (H) 12/07/2021   BUN 28 (H) 12/07/2021   CREATININE 0.87 12/07/2021   CALCIUM 8.1 (L) 12/07/2021   GFRNONAA >60 12/07/2021   GFRAA >60 10/08/2019    Lab Results  Component Value Date   ALT 9 12/04/2021   AST 16 12/04/2021   ALKPHOS 39 12/04/2021   BILITOT 0.8 12/04/2021       Component Value Date/Time   CRP 21.2 (H) 12/02/2021 0907    No results found for: "ESRSEDRATE"   I have reviewed the micro and lab results in Epic.  Imaging: DG CHEST PORT 1 VIEW  Result Date: 12/06/2021 CLINICAL DATA:  Status post central line placement EXAM: PORTABLE CHEST 1 VIEW COMPARISON:  Chest x-ray dated December 06, 2021 obtained at 6:18 p.m. FINDINGS: Slight interval retraction of ET tube which is approximately 1.0 cm from the carina. Interval placement of left IJ line with tip overlying the expected area of the mid SVC. Enteric tube partially seen coursing below the diaphragm.  Right-greater-than-left heterogeneous opacities, unchanged. No large pleural effusion or evidence of pneumothorax. IMPRESSION: 1. Interval placement of left IJ line with tip overlying the expected area of the mid SVC. 2. Slight interval retraction of ET tube which is approximately 1.0 cm from the carina. Consider further retraction for optimal positioning. Electronically Signed   By: Yetta Glassman M.D.   On: 12/06/2021 19:44   DG Abd 1 View  Result Date: 12/06/2021 CLINICAL DATA:  Orogastric tube placement EXAM: ABDOMEN - 1 VIEW COMPARISON:  CT 12/05/2021 FINDINGS: Limited radiograph of the lower chest and upper abdomen was obtained for the purposes of enteric tube localization. Enteric tube is seen coursing below the diaphragm with distal tip and side port terminating within the expected location of the distal stomach. Endotracheal tube position in the right mainstem bronchus, as  seen on dedicated chest x-ray. IMPRESSION: Enteric tube tip and side port project within the expected location of the distal stomach. Electronically Signed   By: Davina Poke D.O.   On: 12/06/2021 19:00   DG CHEST PORT 1 VIEW  Result Date: 12/06/2021 CLINICAL DATA:  Intubation EXAM: PORTABLE CHEST 1 VIEW COMPARISON:  12/06/2021 at 0845 hours FINDINGS: Interval placement of endotracheal tube extending into the proximal right mainstem bronchus. Interval placement of enteric tube which is coiled within the stomach. Heart size within normal limits. Similar bilateral airspace opacities with progressive opacification of the left lung, likely superimposed atelectasis. No pneumothorax. IMPRESSION: 1. Interval placement of endotracheal tube extending into the proximal right mainstem bronchus. Recommend retraction approximately 4 cm. 2. Increasing left lung opacification, likely atelectasis. 3. Interval placement of enteric tube which is coiled within the stomach. These results will be called to the ordering clinician or  representative by the Radiologist Assistant, and communication documented in the PACS or [employer]. Electronically Signed   By: Davina Poke D.O.   On: 12/06/2021 18:57   DG CHEST PORT 1 VIEW  Result Date: 12/06/2021 CLINICAL DATA:  Pneumonia EXAM: PORTABLE CHEST 1 VIEW COMPARISON:  Previous studies including the chest radiograph done on 12/02/2021 and CT done on 12/05/2021 FINDINGS: Transverse diameter of heart is increased. Large alveolar infiltrates are seen in left upper, mid and lower lung fields with interval worsening. There are new patchy infiltrates in right lung. Right lateral CP angle is clear. There is no pneumothorax. Surgical clips are seen in left axilla and left chest wall. IMPRESSION: Cardiomegaly. Extensive infiltrates are seen in left lung with interval progression. There are multiple patchy infiltrates in the right lung. Findings suggest interval worsening of bilateral multifocal pneumonia. Electronically Signed   By: Elmer Picker M.D.   On: 12/06/2021 09:46   CT CHEST ABDOMEN PELVIS W CONTRAST  Result Date: 12/05/2021 CLINICAL DATA:  Sepsis.  Persistent fever.  Pancytopenia. EXAM: CT CHEST, ABDOMEN, AND PELVIS WITH CONTRAST TECHNIQUE: Multidetector CT imaging of the chest, abdomen and pelvis was performed following the standard protocol during bolus administration of intravenous contrast. RADIATION DOSE REDUCTION: This exam was performed according to the departmental dose-optimization program which includes automated exposure control, adjustment of the mA and/or kV according to patient size and/or use of iterative reconstruction technique. CONTRAST:  131m OMNIPAQUE IOHEXOL 300 MG/ML  SOLN COMPARISON:  10/21/2021 FINDINGS: CT CHEST FINDINGS Cardiovascular: Heart size appears normal. No pericardial effusion. Aortic atherosclerosis. Mediastinum/Nodes: Thyroid gland, trachea, and esophagus are unremarkable. Previous left axillary node dissection. No axillary,  mediastinal, or hilar adenopathy identified. Lungs/Pleura: Severe, multifocal bilateral airspace opacities identified compatible with pneumonia. This is most severe within the left lower lobe where there is near complete consolidation. There is dense airspace consolidation noted within the posterolateral right lower lobe with overlying areas of ground-glass attenuation. Airspace disease within the inferior lingula is also identified. Patchy area of ground-glass attenuation in the left apex is unchanged from the previous exam. There is a small left pleural effusion which overlies the posterior right upper lobe and superior segment of left lower lobe. There is also a small amount of fluid identified overlying the left lung base. Musculoskeletal: Left mastectomy. No acute or suspicious osseous findings. CT ABDOMEN PELVIS FINDINGS Hepatobiliary: No focal liver abnormality is seen. No gallstones, gallbladder wall thickening, or biliary dilatation. Pancreas: Unremarkable. No pancreatic ductal dilatation or surrounding inflammatory changes. Spleen: Normal in size without focal abnormality. Adrenals/Urinary Tract: Normal adrenal glands. No nephrolithiasis,  hydronephrosis or mass. Urinary bladder appears normal. Stomach/Bowel: Stomach is within normal limits. Appendix appears normal. No evidence of bowel wall thickening, distention, or inflammatory changes. Vascular/Lymphatic: Aortic atherosclerosis. No signs of abdominopelvic adenopathy. Reproductive: Uterus and bilateral adnexa are unremarkable. Other: No free fluid or fluid collections. Musculoskeletal: No acute or significant osseous findings. Scattered subcutaneous injection sites identified within the subcutaneous soft tissues of the ventral abdominal wall. IMPRESSION: 1. Severe, multifocal bilateral airspace opacities compatible with pneumonia. This is most severe within the left lower lobe where there is near complete airspace consolidation. 2. Small left pleural  effusion. 3. No acute findings identified within the abdomen or pelvis. 4.  Aortic Atherosclerosis (ICD10-I70.0). Electronically Signed   By: Kerby Moors M.D.   On: 12/05/2021 14:14     Imaging independently reviewed in Epic.    Raynelle Highland for Infectious Disease [facility] [phone] pager 12/07/2021, 8:05 AM

## 2021-12-07 NOTE — Progress Notes (Signed)
NAME:  Amanda Duarte, MRN:  [id number], DOB:  [birth year], LOS: 5 ADMISSION DATE:  12/01/2021, CONSULTATION DATE:  12/02/21 REFERRING MD:  Marylyn Ishihara, CHIEF COMPLAINT:  fever   History of Present Illness:  66yF with history of HTN, HLD, anemia, anxiety/depression, DM2, L breast CA s/p L mastectomy and adjuvant XRT neoadjuvant ddAC-TC, 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 Was in [place] until about 2d PTA developed weakness, fatigue. Family saw her yesterday, confused, febrile, called EMS. She says she has some cough but not expectorating anything. Doesn't report any other infectious sx.   Had PLT transfusion 11/4. In ED febrile and found to have LLL/lingula pneumonia, given vanc/cefepime/azithro, 2.75L LR, started on LR infusion. Transfused 1U pRBC at 0700 11/9.  Also noted to have worsening hyponatremia na 122 (corrected 124-126). Lactic 8 ->2.2, worsening pancytopenia  She has been maintained on broad-spectrum antibiotics and has been followed by infectious disease.  PCCM called back on 11/13 due to progressive respiratory failure with severe hypoxia and tachypnea. Intubated, central access placed, vasopressors added.   Pertinent  Medical History  HTN Anemia DM2 Breast CA MDS  Significant Hospital Events: Including procedures, antibiotic start and stop dates in addition to other pertinent events   11/9 admitted, broad spectrum antibiotics 11/12 escalated to meropenem 11/13 intubated   Interim History / Subjective:  Tmax 101.1 / WBC 1.6 Few WBC on GS from BAL  Glucose range 173-203 I/O 22m UOP, +2.4L in last 24 hours  RN reports episode of AF overnight, started on amiodarone IV. Runs of VT noted, frequent bigeminy  Objective   Blood pressure (!) 114/49, pulse 94, temperature (!) 100.8 F (38.2 C), resp. rate (!) 30, height 5' (1.524 m), weight 54.2 kg, SpO2 98  %.    Vent Mode: PRVC FiO2 (%):  [50 %-100 %] 50 % Set Rate:  [24 bmp-30 bmp] 30 bmp Vt Set:  [360 mL] 360 mL PEEP:  [10 cQPR91-63cmH20] 12 cmH20 Plateau Pressure:  [21 cmH20-30 cmH20] 21 cmH20   Intake/Output Summary (Last 24 hours) at 12/07/2021 [id number] data filed at 12/07/2021 0845 Gross per 24 hour  Intake 2915.22 ml  Output 225 ml  Net 2690.22 ml   Filed Weights   12/02/21 1303 12/07/21 0354  Weight: 52.7 kg 54.2 kg    Examination: General: small adult female lying in bed in NAD HEENT: MM pink/moist, ETT, anicteric, pupils equal/reactive Neuro: sedate, awakens to voice, nods appropriately, MAE  CV: s1s2 RRR, NSR 80's on monitor, no m/r/g, JVD+ PULM: non-labored at rest, bronchial breath sounds on left, coarse on right with rhonchi GI: soft, bsx4 active  Extremities: warm/dry, no edema  Skin: no rashes or lesions  CT chest reviewed> severe multilobar pneumonia, L>R  Urine Strep, Legionella 11/9 negative Platelets [facility]   Hyponatremia  Assessment & Plan:   ARDS Acute Hypercarbic / Hypoxic Respiratory Failure Multilobar Pneumonia and Immunocompromised Host Intubated 11/13 for progressive resp failure, hypercarbia.  -PRVC with LTVV -f/u BAL, BC -continue abx as below  -SBT / WUA when more stable  -VAP prevention measures  -assess ABG now, review P/F ratio -intermittent CXR   Sepsis due to Pneumonia -appreciate ID -suspect primary source LLL PNA  -follow up cultures, BAL  -continue meropenem, voriconazole, vancomycin   Paroxysmal Atrial Fibrillation  VT  Overnight 11/14 -amiodarone  -tele  monitoring  -note amio & vori can prolong QT -pending clinical course, may need Cardiology evaluation  -assess ECHO given chemo / possible reduction of EF  Hypotension History of hypertension -hold home antihypertensives -as above   Pancytopenia MDS -appreciate Dr. Ernestina Penna evaluation, rec's for holding GCSF due to  high risk transformation to AML  -platelet goal >20k per ONC  -transfuse for Hgb <7% or concern for active bleeding   Hypokalemia -monitor, replace as indicated   History of Breast Cancer -supportive care  DM2 with hyperglycemia -SSI, moderate scale  -levemir 20 units QD -add TF coverage 2units Q4   At Risk Malnutrition  -TF per Nutrition   Best Practice (right click and "Reselect all SmartList Selections" daily)  Diet/type: tubefeeds DVT prophylaxis: SCD GI prophylaxis: PPI Lines: Central line Foley:  N/A Code Status:  full code Last date of multidisciplinary goals of care discussion: 11/13 per Dr. Carlis Abbott. Daughter, [name], updated on plan of care 11/14  Critical Care Time: 32 minutes    Noe Gens, MSN, APRN, NP-C, AGACNP-BC [facility] 12/07/2021, 9:10 AM   Please see [email or web address] for pager details.   From 7A-7P if no response, please call [phone] After hours, please call ELink [phone]

## 2021-12-07 NOTE — Progress Notes (Signed)
Episode of VT, frequent PVC's on monitor.  Spontaneously resolved, patient asymptomatic with rhythm change. Awake with entry to room.   Plan: Assess STAT BMP, Mg+ Assess EKG Assess ECHO now, ? Chemo related cardiomyopathy  Continue amiodarone for now, note amio & vori can prolong QTc    Amanda Gens, MSN, APRN, NP-C, AGACNP-BC [facility] 12/07/2021, 11:16 AM   Please see [email or web address] for pager details.   From 7A-7P if no response, please call [phone] After hours, please call ELink [phone]

## 2021-12-07 NOTE — Progress Notes (Signed)
  Amiodarone Drug - Drug Interaction Consult Note  Recommendations: none Amiodarone is metabolized by the cytochrome P450 system and therefore has the potential to cause many drug interactions. Amiodarone has an average plasma half-life of 50 days (range 20 to 100 days).   There is potential for drug interactions to occur several weeks or months after stopping treatment and the onset of drug interactions may be slow after initiating amiodarone.   '[]'$  Statins: Increased risk of myopathy. Simvastatin- restrict dose to '20mg'$  daily. Other statins: counsel patients to report any muscle pain or weakness immediately.  '[]'$  Anticoagulants: Amiodarone can increase anticoagulant effect. Consider warfarin dose reduction. Patients should be monitored closely and the dose of anticoagulant altered accordingly, remembering that amiodarone levels take several weeks to stabilize.  '[]'$  Antiepileptics: Amiodarone can increase plasma concentration of phenytoin, the dose should be reduced. Note that small changes in phenytoin dose can result in large changes in levels. Monitor patient and counsel on signs of toxicity.  '[]'$  Beta blockers: increased risk of bradycardia, AV block and myocardial depression. Sotalol - avoid concomitant use.  '[]'$   Calcium channel blockers (diltiazem and verapamil): increased risk of bradycardia, AV block and myocardial depression.  '[]'$   Cyclosporine: Amiodarone increases levels of cyclosporine. Reduced dose of cyclosporine is recommended.  '[]'$  Digoxin dose should be halved when amiodarone is started.  '[]'$  Diuretics: increased risk of cardiotoxicity if hypokalemia occurs.  '[]'$  Oral hypoglycemic agents (glyburide, glipizide, glimepiride): increased risk of hypoglycemia. Patient's glucose levels should be monitored closely when initiating amiodarone therapy.   '[]'$  Drugs that prolong the QT interval:  Torsades de pointes risk may be increased with concurrent use - avoid if possible.  Monitor QTc,  also keep magnesium/potassium WNL if concurrent therapy can't be avoided.  Antibiotics: e.g. fluoroquinolones, erythromycin.  Antiarrhythmics: e.g. quinidine, procainamide, disopyramide, sotalol.  Antipsychotics: e.g. phenothiazines, haloperidol.   Lithium, tricyclic antidepressants, and methadone. Thank You,  Dolly Rias RPh 12/07/2021, 12:28 AM

## 2021-12-08 ENCOUNTER — Inpatient Hospital Stay (HOSPITAL_COMMUNITY): Payer: Medicare Other

## 2021-12-08 ENCOUNTER — Ambulatory Visit: Payer: Self-pay

## 2021-12-08 ENCOUNTER — Other Ambulatory Visit: Payer: Self-pay

## 2021-12-08 DIAGNOSIS — E119 Type 2 diabetes mellitus without complications: Secondary | ICD-10-CM | POA: Diagnosis not present

## 2021-12-08 DIAGNOSIS — J9601 Acute respiratory failure with hypoxia: Secondary | ICD-10-CM | POA: Diagnosis not present

## 2021-12-08 DIAGNOSIS — Z9911 Dependence on respirator [ventilator] status: Secondary | ICD-10-CM | POA: Diagnosis not present

## 2021-12-08 DIAGNOSIS — Z794 Long term (current) use of insulin: Secondary | ICD-10-CM

## 2021-12-08 DIAGNOSIS — J690 Pneumonitis due to inhalation of food and vomit: Secondary | ICD-10-CM | POA: Diagnosis not present

## 2021-12-08 DIAGNOSIS — J80 Acute respiratory distress syndrome: Secondary | ICD-10-CM | POA: Diagnosis not present

## 2021-12-08 DIAGNOSIS — D46Z Other myelodysplastic syndromes: Secondary | ICD-10-CM | POA: Diagnosis not present

## 2021-12-08 LAB — GLUCOSE, CAPILLARY
Glucose-Capillary: 136 mg/dL — ABNORMAL HIGH (ref 70–99)
Glucose-Capillary: 211 mg/dL — ABNORMAL HIGH (ref 70–99)
Glucose-Capillary: 217 mg/dL — ABNORMAL HIGH (ref 70–99)
Glucose-Capillary: 231 mg/dL — ABNORMAL HIGH (ref 70–99)
Glucose-Capillary: 248 mg/dL — ABNORMAL HIGH (ref 70–99)
Glucose-Capillary: 297 mg/dL — ABNORMAL HIGH (ref 70–99)
Glucose-Capillary: 321 mg/dL — ABNORMAL HIGH (ref 70–99)
Glucose-Capillary: 328 mg/dL — ABNORMAL HIGH (ref 70–99)
Glucose-Capillary: 328 mg/dL — ABNORMAL HIGH (ref 70–99)

## 2021-12-08 LAB — CBC
HCT: 23.6 % — ABNORMAL LOW (ref 36.0–46.0)
Hemoglobin: 8 g/dL — ABNORMAL LOW (ref 12.0–15.0)
MCH: 30.7 pg (ref 26.0–34.0)
MCHC: 33.9 g/dL (ref 30.0–36.0)
MCV: 90.4 fL (ref 80.0–100.0)
Platelets: 10 10*3/uL — CL (ref 150–400)
RBC: 2.61 MIL/uL — ABNORMAL LOW (ref 3.87–5.11)
RDW: 15.6 % — ABNORMAL HIGH (ref 11.5–15.5)
WBC: 1.2 10*3/uL — CL (ref 4.0–10.5)
nRBC: 0 % (ref 0.0–0.2)

## 2021-12-08 LAB — CULTURE, RESPIRATORY W GRAM STAIN: Culture: NORMAL

## 2021-12-08 LAB — BASIC METABOLIC PANEL
Anion gap: 6 (ref 5–15)
BUN: 56 mg/dL — ABNORMAL HIGH (ref 8–23)
CO2: 25 mmol/L (ref 22–32)
Calcium: 8.2 mg/dL — ABNORMAL LOW (ref 8.9–10.3)
Chloride: 110 mmol/L (ref 98–111)
Creatinine, Ser: 0.62 mg/dL (ref 0.44–1.00)
GFR, Estimated: >60 mL/min (ref >60)
Glucose, Bld: 363 mg/dL — ABNORMAL HIGH (ref 70–99)
Potassium: 3.5 mmol/L (ref 3.5–5.1)
Sodium: 141 mmol/L (ref 135–145)

## 2021-12-08 LAB — PHOSPHORUS: Phosphorus: 2.3 mg/dL — ABNORMAL LOW (ref 2.5–4.6)

## 2021-12-08 LAB — MAGNESIUM: Magnesium: 2.7 mg/dL — ABNORMAL HIGH (ref 1.7–2.4)

## 2021-12-08 MED ORDER — SODIUM CHLORIDE 0.9% IV SOLUTION: Freq: Once | INTRAVENOUS | Status: DC

## 2021-12-08 MED ORDER — INSULIN ASPART 100 UNIT/ML IJ SOLN
10.0000 [IU] | Freq: Once | INTRAMUSCULAR | Status: AC
  Administered 2021-12-08: 10 [IU] via SUBCUTANEOUS

## 2021-12-08 MED ORDER — SODIUM CHLORIDE 0.9 % IV SOLN
100.0000 mg | INTRAVENOUS | Status: DC
  Administered 2021-12-08 – 2021-12-14 (×7): 100 mg via INTRAVENOUS
  Filled 2021-12-08 (×8): qty 5

## 2021-12-08 MED ORDER — ALBUMIN HUMAN 25 % IV SOLN
25.0000 g | Freq: Once | INTRAVENOUS | Status: AC
  Administered 2021-12-08: 25 g via INTRAVENOUS
  Filled 2021-12-08: qty 100

## 2021-12-08 MED ORDER — POTASSIUM PHOSPHATES 15 MMOLE/5ML IV SOLN
30.0000 mmol | Freq: Once | INTRAVENOUS | Status: AC
  Administered 2021-12-08: 30 mmol via INTRAVENOUS
  Filled 2021-12-08: qty 10

## 2021-12-08 MED ORDER — METHYLPREDNISOLONE SODIUM SUCC 125 MG IJ SOLR
125.0000 mg | Freq: Two times a day (BID) | INTRAMUSCULAR | Status: DC
  Administered 2021-12-08 – 2021-12-10 (×5): 125 mg via INTRAVENOUS
  Filled 2021-12-08 (×5): qty 2

## 2021-12-08 MED ORDER — VASOPRESSIN 20 UNITS/100 ML INFUSION FOR SHOCK
0.0000 [IU]/min | INTRAVENOUS | Status: DC
  Administered 2021-12-08 – 2021-12-09 (×3): 0.03 [IU]/min via INTRAVENOUS
  Filled 2021-12-08 (×3): qty 100

## 2021-12-08 MED ORDER — INSULIN REGULAR(HUMAN) IN NACL 100-0.9 UT/100ML-% IV SOLN
INTRAVENOUS | Status: DC
  Administered 2021-12-08: 8 [IU]/h via INTRAVENOUS
  Administered 2021-12-08: 12 [IU]/h via INTRAVENOUS
  Filled 2021-12-08 (×2): qty 100

## 2021-12-08 MED ORDER — INSULIN ASPART 100 UNIT/ML IJ SOLN
5.0000 [IU] | INTRAMUSCULAR | Status: DC
  Administered 2021-12-08 (×2): 5 [IU] via SUBCUTANEOUS

## 2021-12-08 MED ORDER — PHENYLEPHRINE HCL-NACL 20-0.9 MG/250ML-% IV SOLN
0.0000 ug/min | INTRAVENOUS | Status: DC
  Filled 2021-12-08: qty 250

## 2021-12-08 MED ORDER — POTASSIUM CHLORIDE 20 MEQ PO PACK
40.0000 meq | PACK | Freq: Once | ORAL | Status: AC
  Administered 2021-12-08: 40 meq
  Filled 2021-12-08: qty 2

## 2021-12-08 MED ORDER — PANTOPRAZOLE SODIUM 40 MG IV SOLR
40.0000 mg | Freq: Two times a day (BID) | INTRAVENOUS | Status: DC
  Administered 2021-12-08 – 2021-12-12 (×9): 40 mg via INTRAVENOUS
  Filled 2021-12-08 (×9): qty 10

## 2021-12-08 MED ORDER — DEXTROSE 50 % IV SOLN: 0.0000 mL | INTRAVENOUS | Status: DC | PRN

## 2021-12-08 NOTE — Progress Notes (Signed)
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 Was in [place] until about 2d PTA developed weakness, fatigue. Family saw her yesterday, confused, febrile, called EMS. She says she has some cough but not expectorating anything. Doesn't report any other infectious sx.   Had PLT transfusion 11/4. In ED febrile and found to have LLL/lingula pneumonia, given vanc/cefepime/azithro, 2.75L LR, started on LR infusion. Transfused 1U pRBC at 0700 11/9.  Also noted to have worsening hyponatremia na 122 (corrected 124-126). Lactic 8 ->2.2, worsening pancytopenia  She has been maintained on broad-spectrum antibiotics and has been followed by infectious disease.  PCCM called back on 11/13 due to progressive respiratory failure with severe hypoxia and tachypnea. Intubated, central access placed, vasopressors added.   Pertinent  Medical History  HTN Anemia DM2 Breast CA MDS  Significant Hospital Events: Including procedures, antibiotic start and stop dates in addition to other pertinent events   11/9 admitted, broad spectrum antibiotics 11/12 escalated to meropenem 11/13 intubated   Interim History / Subjective:  Tmax 101.3. Tachycardic up to 150s this morning.  Objective   Blood pressure (!) 133/46, pulse 82, temperature 98.8 F (37.1 C), resp. rate 14, height 5' (1.524 m), weight 55.9 kg, SpO2 97 %. CVP:  [3 mmHg-14 mmHg] 9 mmHg  Vent Mode: PRVC FiO2 (%):  [40 %-50 %] 40 % Set Rate:  [30 bmp] 30 bmp Vt Set:  [360 mL] 360 mL PEEP:  [12 cmH20] 12  cmH20 Plateau Pressure:  [22 cmH20-26 cmH20] 26 cmH20   Intake/Output Summary (Last 24 hours) at 12/08/2021 0906 Last data filed at 12/08/2021 0650 Gross per 24 hour  Intake 3155.19 ml  Output 1310 ml  Net 1845.19 ml    Filed Weights   12/02/21 1303 12/07/21 0354 12/08/21 0500  Weight: 52.7 kg 54.2 kg 55.9 kg    Examination: General: critically ill appearing woman lying in bed in NAD HEENT: [place]/AT, eyes anicteric, ETT in place Neuro: RASS -4, strong cough CV: S1s2, tachycardic, irreg rhythm PULM: rhonchi improved with suctioning, rhales, tubular breath sounds in bases. Light yellow ETT secretions, blood-tinged today. GI: soft, NT Extremities: no peripheral edema, no cyanosis  Skin: warm, dry, no rashes.  CXR personally reviewed> persistent infiltrates RLL, RML, densely consolidated LLL  WBC 1.2 H/H 8/23.6 Platelets 10 BG 300s Cultures reviewed> all NGTD  Qtc 593m  Resolved Hospital Problem list   Hyponatremia  Assessment & Plan:   ARDS Acute hypercarbic & hypoxic respiratory failure Multilobar pneumonia in immunocompromised host -LTVV -VAP prevention protocol -PAD protocol for sedation -con't antimicrobials; defer to ID -follow cultures -planning for bronch today to assess for DAH since secretions from ETT are getting blood-tinged  Septic shock due to pneumonia, presumed aspiration pneumonia -appreciate ID's management -con't antibiotics -follow cultures -pressors to maintain MAP >65; transition to vasopressin with Afib  Uncontrolled hyperglycemia -insulin gtt -goal BG 140-180  Paroxysmal atrial fibrillation likely 2/2 acute illness Polymorphic VT, prolonged Qtc -off amiodarone due to TdP and worsen Qtc prolongation -tele monitoring -switch NE to vasopressin -monitor electrolytes and replete PRN -  avoid all Qtc prolonging meds -With borderline low EF, would still try to avoid CCB. Digoxin may be an option, but trying to avoid, especially since she is  not remaining persistently elevated. Would only treat if HR is staying persistently elevated > ~120 given that this is likely physiologically appropriate with fever, sepsis, anemia. -AC contraindicated with severe thrombocytopenia  Hypotension History of hypertension -hold home antihypertensives  Pancytopenia MDS, previous history of breast cancer -appreciate Dr. Ernestina Penna evaluation, rec's for holding GCSF due to high risk transformation to AML  -transfuse for platelets <20 per oncology -transfuse for Hb <7 or hemodynamically significant bleeding   Hypokalemia, resolved -goal K+ 4-5 with arrhythmias  Hypophosphatemia -repleted -monitor  At risk malnutrition  -TF  Best Practice (right click and "Reselect all SmartList Selections" daily)  Diet/type: tubefeeds DVT prophylaxis: SCD GI prophylaxis: PPI Lines: Central line Foley:  Yes, and it is still needed Code Status:  full code Last date of multidisciplinary goals of care discussion: 11/13 per Dr. Carlis Abbott. Daughter, Amanda Duarte, updated on plan of care 11/15  Critical Care Time:    This patient is critically ill with multiple organ system failure which requires frequent high complexity decision making, assessment, support, evaluation, and titration of therapies. This was completed through the application of advanced monitoring technologies and extensive interpretation of multiple databases. During this encounter critical care time was devoted to patient care services described in this note for 40 minutes.  Julian Hy, DO 12/08/21 9:52 AM [facility]

## 2021-12-08 NOTE — Progress Notes (Signed)
[facility] Progress Note Patient Name: Amanda Duarte DOB: [birth year] MRN: [id number]   Date of Service  12/08/2021  HPI/Events of Note  Platelet count 10 K.  eICU Interventions  One platelet pack transfused.        Poonam Woehrle U [name] 12/08/2021, 6:01 AM

## 2021-12-08 NOTE — Progress Notes (Signed)
[facility] Progress Note Patient Name: Amanda Duarte DOB: 1955-10-10 MRN: [id number]   Date of Service  12/08/2021  HPI/Events of Note  Hypotension - BP = 86/38. Patient has central venous line and history of AFIB.   eICU Interventions  Plan: Phenylephrine IV infusion. Titrate to MAP > = 65. Monitor CVP now and Q 4 hours.      Intervention Category Major Interventions: Hypotension - evaluation and management  Lysle Dingwall 12/08/2021, 7:48 PM

## 2021-12-08 NOTE — Progress Notes (Signed)
Call to elink, CBG in the 300's consistently to discuss insulin coverage. No new orders at this time.

## 2021-12-08 NOTE — Progress Notes (Signed)
Amanda Duarte   DOB:[birth year]   ML#:[id number]   FHQ#:[id number]  Hem/onc follow up   Subjective: Patient developed septic shock, hypoxia and respiratory failure 2 days ago, has been intubated, on 1 pressor.  No significant bleeding except blood-tinged sputum production.  She underwent bronchoscopy for BAL today.  Patient is sedated.  Granddaughter at the bedside.   Objective:  Vitals:   12/08/21 1545 12/08/21 1600  BP: (!) 100/41 (!) 99/45  Pulse: 85 83  Resp: (!) 30 (!) 30  Temp: (!) 101.1 F (38.4 C) (!) 101.1 F (38.4 C)  SpO2: 97% 97%    Body mass index is 24.07 kg/m.  Intake/Output Summary (Last 24 hours) at 12/08/2021 1616 Last data filed at 12/08/2021 1422 Gross per 24 hour  Intake 2819.6 ml  Output 1075 ml  Net 1744.6 ml     Sclerae unicteric  Abdomen soft   Intubated and sedated   Urine yellow     CBG (last 3)  Recent Labs    12/08/21 0345 12/08/21 0820 12/08/21 1043  GLUCAP 328* 328* 248*     Labs:  Urine Studies No results for input(s): "UHGB", "CRYS" in the last 72 hours.  Invalid input(s): "UACOL", "UAPR", "USPG", "UPH", "UTP", "UGL", "UKET", "UBIL", "UNIT", "UROB", "ULEU", "UEPI", "UWBC", "URBC", "UBAC", "CAST", "UCOM", "BILUA"  Basic Metabolic Panel: Recent Labs  Lab 12/02/21 1921 12/03/21 0522 12/04/21 0254 12/05/21 0254 12/06/21 0249 12/07/21 0245 12/07/21 1112 12/08/21 0509  NA 132* 129* 132* 134* 136 135 138 141  K 3.8 3.4* 2.8* 3.4* 3.3* 4.3 3.8 3.5  CL 106 98 102 102 105 102 105 110  CO2 19* _0 18* 23 25  GLUCOSE 229* 326* 57* 80 113* 193* 324* 363*  BUN _1 28* 42* 56*  CREATININE 0.53 0.61 0.52 0.46 0.50 0.87 0.81 0.62  CALCIUM 8.0* 8.0* 8.3* 8.3* 8.1* 8.1* 7.7* 8.2*  MG  --  1.9 1.7  --   --  2.0 2.0 2.7*  PHOS 1.9* 2.8 2.4*  --   --  6.0*  --  2.3*   GFR Estimated Creatinine Clearance: 54.3 mL/min (by C-G formula based on SCr of 0.62 mg/dL). Liver Function Tests: Recent Labs  Lab 12/01/21 2313  12/02/21 1100 12/02/21 1921 12/03/21 0522 12/04/21 0254  AST 15 14*  --  18 16  ALT 10 10  --  11 9  ALKPHOS 48 48  --  44 39  BILITOT 1.0 1.6*  --  0.8 0.8  PROT 7.0 7.1  --  6.6 6.3*  ALBUMIN 2.2* 2.3* 2.3* 2.0* 2.1*   No results for input(s): "LIPASE", "AMYLASE" in the last 168 hours. No results for input(s): "AMMONIA" in the last 168 hours. Coagulation profile Recent Labs  Lab 12/01/21 2313 12/03/21 0522  INR 1.6* 1.5*    CBC: Recent Labs  Lab 12/02/21 1100 12/03/21 0522 12/04/21 0254 12/05/21 0254 12/06/21 0249 12/07/21 0245 12/08/21 0509  WBC 1.9*   < > 2.1* 1.5* 1.3* 1.6* 1.2*  NEUTROABS 0.9*  --  0.6* 0.4* 0.4* 0.5*  --   HGB 8.6*   < > 8.4* 8.1* 7.0* 8.7* 8.0*  HCT 24.4*   < > 24.8* 22.9* 20.1* 26.8* 23.6*  MCV 87.1   < > 88.9 86.4 87.4 93.4 90.4  PLT 12*   < > 25* 15* 7* 34* 10*   < > = values in this interval not displayed.   Cardiac Enzymes: No results for input(s): "CKTOTAL", "  CKMB", "CKMBINDEX", "TROPONINI" in the last 168 hours. BNP: Invalid input(s): "POCBNP" CBG: Recent Labs  Lab 12/07/21 1939 12/07/21 2347 12/08/21 0345 12/08/21 0820 12/08/21 1043  GLUCAP 297* 321* 328* 328* 248*   D-Dimer No results for input(s): "DDIMER" in the last 72 hours. Hgb A1c No results for input(s): "HGBA1C" in the last 72 hours. Lipid Profile No results for input(s): "CHOL", "HDL", "LDLCALC", "TRIG", "CHOLHDL", "LDLDIRECT" in the last 72 hours. Thyroid function studies No results for input(s): "TSH", "T4TOTAL", "T3FREE", "THYROIDAB" in the last 72 hours.  Invalid input(s): "FREET3" Anemia work up No results for input(s): "VITAMINB12", "FOLATE", "FERRITIN", "TIBC", "IRON", "RETICCTPCT" in the last 72 hours. Microbiology Recent Results (from the past 240 hour(s))  Blood Culture (routine x 2)     Status: None   Collection Time: 12/01/21 11:13 PM   Specimen: BLOOD  Result Value Ref Range Status   Specimen Description   Final    BLOOD RIGHT  ANTECUBITAL Performed at [facility] [address] [place], [place] [address]    Special Requests   Final    BOTTLES DRAWN AEROBIC AND ANAEROBIC Blood Culture results may not be optimal due to an excessive volume of blood received in culture bottles Performed at [facility] [address] [place], [place] [address]    Culture   Final    NO GROWTH 5 DAYS Performed at [facility], [place] [address], [place], [place] [address]    Report Status 12/07/2021 FINAL  Final  Blood Culture (routine x 2)     Status: None   Collection Time: 12/01/21 11:13 PM   Specimen: BLOOD  Result Value Ref Range Status   Specimen Description   Final    BLOOD SITE NOT SPECIFIED Performed at [facility] [address] [place], [place] [address]    Special Requests   Final    NONE Performed at [facility], [place] [address], [place], [place] [address]    Culture   Final    NO GROWTH 5 DAYS Performed at [facility], [place] [address] [place], [place] [address]    Report Status 12/07/2021 FINAL  Final  Resp Panel by RT-PCR (Flu A&B, Covid) Anterior Nasal Swab     Status: None   Collection Time: 12/01/21 11:25 PM   Specimen: Anterior Nasal Swab  Result Value Ref Range Status   SARS Coronavirus 2 by RT PCR NEGATIVE NEGATIVE Final    Comment: (NOTE) SARS-CoV-2 target nucleic acids are NOT DETECTED.  The SARS-CoV-2 RNA is generally detectable in upper respiratory specimens during the acute phase of infection. The lowest concentration of SARS-CoV-2 viral copies this assay can detect is 138 copies/mL. A negative result does not preclude SARS-Cov-2 infection and should not be used as the sole basis for treatment or other patient management decisions. A negative result may occur with  improper specimen collection/handling, submission of specimen other than nasopharyngeal swab, presence of viral mutation(s) within  the areas targeted by this assay, and inadequate number of viral copies(<138 copies/mL). A negative result must be combined with clinical observations, patient history, and epidemiological information. The expected result is Negative.  Fact Sheet for Patients:  EntrepreneurPulse.com.au  Fact Sheet for Healthcare Providers:  IncredibleEmployment.be  This test is no t yet approved or cleared by the [place] FDA and  has been authorized for detection and/or diagnosis of SARS-CoV-2 by FDA under an Emergency Use Authorization (EUA). This EUA will remain  in effect (meaning this test can be used) for the duration of the COVID-19 declaration under Section 564(b)(1) of the Act, 21 U.S.C.section 360bbb-3(b)(1), unless the authorization is terminated  or revoked sooner.       Influenza A by PCR NEGATIVE NEGATIVE Final   Influenza B by PCR NEGATIVE NEGATIVE Final    Comment: (NOTE) The Xpert Xpress SARS-CoV-2/FLU/RSV plus assay is intended as an aid in the diagnosis of influenza from Nasopharyngeal swab specimens and should not be used as a sole basis for treatment. Nasal washings and aspirates are unacceptable for Xpert Xpress SARS-CoV-2/FLU/RSV testing.  Fact Sheet for Patients: EntrepreneurPulse.com.au  Fact Sheet for Healthcare Providers: IncredibleEmployment.be  This test is not yet approved or cleared by the [place] FDA and has been authorized for detection and/or diagnosis of SARS-CoV-2 by FDA under an Emergency Use Authorization (EUA). This EUA will remain in effect (meaning this test can be used) for the duration of the COVID-19 declaration under Section 564(b)(1) of the Act, 21 U.S.C. section 360bbb-3(b)(1), unless the authorization is terminated or revoked.  Performed at [facility], [place] [address] [place], [place] [address]   Respiratory (~20 pathogens) panel by PCR      Status: None   Collection Time: 12/01/21 11:25 PM   Specimen: Nasopharyngeal Swab; Respiratory  Result Value Ref Range Status   Adenovirus NOT DETECTED NOT DETECTED Final   Coronavirus 229E NOT DETECTED NOT DETECTED Final    Comment: (NOTE) The Coronavirus on the Respiratory Panel, DOES NOT test for the novel  Coronavirus (2019 nCoV)    Coronavirus HKU1 NOT DETECTED NOT DETECTED Final   Coronavirus NL63 NOT DETECTED NOT DETECTED Final   Coronavirus OC43 NOT DETECTED NOT DETECTED Final   Metapneumovirus NOT DETECTED NOT DETECTED Final   Rhinovirus / Enterovirus NOT DETECTED NOT DETECTED Final   Influenza A NOT DETECTED NOT DETECTED Final   Influenza B NOT DETECTED NOT DETECTED Final   Parainfluenza Virus 1 NOT DETECTED NOT DETECTED Final   Parainfluenza Virus 2 NOT DETECTED NOT DETECTED Final   Parainfluenza Virus 3 NOT DETECTED NOT DETECTED Final   Parainfluenza Virus 4 NOT DETECTED NOT DETECTED Final   Respiratory Syncytial Virus NOT DETECTED NOT DETECTED Final   Bordetella pertussis NOT DETECTED NOT DETECTED Final   Bordetella Parapertussis NOT DETECTED NOT DETECTED Final   Chlamydophila pneumoniae NOT DETECTED NOT DETECTED Final   Mycoplasma pneumoniae NOT DETECTED NOT DETECTED Final    Comment: Performed at [facility], [place]. [address], [place], [place] [address]  Urine Culture     Status: Abnormal   Collection Time: 12/02/21  3:00 AM   Specimen: In/Out Cath Urine  Result Value Ref Range Status   Specimen Description   Final    IN/OUT CATH URINE Performed at [facility] [address] [place], [place] [address]    Special Requests   Final    NONE Performed at [facility], [place] [address] [place], [place] [address]    Culture MULTIPLE SPECIES PRESENT, SUGGEST RECOLLECTION (A)  Final   Report Status 12/03/2021 FINAL  Final  MRSA Next Gen by PCR, Nasal     Status: None   Collection Time: 12/02/21  1:04 PM   Specimen: Nasal  Mucosa; Nasal Swab  Result Value Ref Range Status   MRSA by PCR Next Gen NOT DETECTED NOT DETECTED Final    Comment: (NOTE) The GeneXpert MRSA Assay (FDA approved for NASAL specimens only), is one  component of a comprehensive MRSA colonization surveillance program. It is not intended to diagnose MRSA infection nor to guide or monitor treatment for MRSA infections. Test performance is not FDA approved in patients less than 18 years old. Performed at [facility], [place] [address], [place], [place] [address]   Culture, blood (Routine X 2) w Reflex to ID Panel     Status: None (Preliminary result)   Collection Time: 12/05/21 11:45 AM   Specimen: BLOOD  Result Value Ref Range Status   Specimen Description   Final    BLOOD SITE NOT SPECIFIED Performed at [facility] [address], [place],  [place] [address]    Special Requests   Final    BOTTLES DRAWN AEROBIC AND ANAEROBIC Blood Culture adequate volume Performed at [facility] [address] [place], [place] [address]    Culture   Final    NO GROWTH 3 DAYS Performed at [facility], [place] [address] [place], [place] [address]    Report Status PENDING  Incomplete  Culture, blood (Routine X 2) w Reflex to ID Panel     Status: None (Preliminary result)   Collection Time: 12/05/21 11:45 AM   Specimen: BLOOD  Result Value Ref Range Status   Specimen Description   Final    BLOOD SITE NOT SPECIFIED Performed at [facility] [address], [place], [place] [address]    Special Requests   Final    BOTTLES DRAWN AEROBIC AND ANAEROBIC Blood Culture adequate volume Performed at [place] [address], [place], [place] [address]    Culture   Final    NO GROWTH 3 DAYS Performed at [facility], [place] [address], [place], [place] [address]    Report Status PENDING  Incomplete  Expectorated Sputum Assessment w Gram Stain, Rflx  to Resp Cult     Status: None   Collection Time: 12/05/21  6:35 PM   Specimen: Sputum  Result Value Ref Range Status   Specimen Description   Final    SPU Performed at [facility] [address], [place], [place] [address]    Special Requests   Final    NONE Performed at [facility], [place] [address], [place], [place] [address]    Sputum evaluation THIS SPECIMEN IS ACCEPTABLE FOR SPUTUM CULTURE  Final   Report Status 12/05/2021 FINAL  Final  Culture, Respiratory w Gram Stain     Status: None   Collection Time: 12/05/21  6:35 PM   Specimen: Sputum  Result Value Ref Range Status   Specimen Description   Final    SPU Performed at [facility] [address], [place],  [address]    Special Requests   Final    NONE Reflexed from [id number] Performed at [facility], [place] [address], [place], [place] [address]    Gram Stain   Final    FEW WBC PRESENT, PREDOMINANTLY MONONUCLEAR FEW SQUAMOUS EPITHELIAL CELLS PRESENT FEW GRAM VARIABLE ROD FEW BUDDING YEAST SEEN    Culture   Final    FEW Normal respiratory flora-no Staph aureus or Pseudomonas seen Performed at [facility], [address] [place],  [address]    Report Status 12/08/2021 FINAL  Final  Culture, Respiratory w Gram Stain     Status: None (Preliminary result)   Collection Time: 12/06/21  6:15 PM   Specimen: Bronchoalveolar Lavage;  Respiratory  Result Value Ref Range Status   Specimen Description   Final    BRONCHIAL ALVEOLAR LAVAGE Performed at [facility] [address] [place], [place] [address]    Special Requests   Final    NONE Performed at [facility], [place] [address] [place], [place] [address]    Gram Stain   Final    FEW WBC PRESENT, PREDOMINANTLY MONONUCLEAR NO ORGANISMS SEEN    Culture   Final    NO GROWTH 2 DAYS Performed at [facility] [address]  [place], [place] [address]    Report Status PENDING  Incomplete      Studies:  DG CHEST PORT 1 VIEW  Result Date: 12/08/2021 CLINICAL DATA:  [id number] with acute respiratory failure with hypoxia and ventilator dependence. EXAM: PORTABLE CHEST 1 VIEW COMPARISON:  Portable 12/06/2021 FINDINGS: 4:41 a.m. ETT interval pullback to 2.6 cm from the carina. The left IJ central line again terminates in the distal SVC. NGT is partially visible coiled in the stomach. Neither the side-hole or the tip of the tube are filmed. The cardiac size is normal. Patchy dense opacities of the left mid and both lower lung fields appear similar, denser on the left as before and with small underlying left pleural effusion. The right mid and both apical lung regions remain generally clear. Overall aeration seems unchanged. The mediastinal configuration is normal. There are left axillary surgical clips, multiple overlying monitor wires. No acute osseous findings. IMPRESSION: 1. ETT interval pullback to 2.6 cm from the carina. 2. NGT coiled in the stomach. 3. Left IJ central line again terminates in the distal SVC. 4. No interval change in the appearance of the lungs. Electronically Signed   By: Telford Nab M.D.   On: 12/08/2021 07:32   ECHOCARDIOGRAM COMPLETE  Result Date: 12/07/2021    ECHOCARDIOGRAM REPORT   Patient Name:   TOYOKO SILOS Date of Exam: 12/07/2021 Medical Rec #:  [id number]   Height:       60.0 in Accession #:    [id number]  Weight:       119.5 lb Date of Birth:  [birth year]    BSA:          1.500 m Patient Age:    66 years    BP:           108/43 mmHg Patient Gender: F           HR:           86 bpm. Exam Location:  Inpatient Procedure: 2D Echo Indications:    Fever  History:        Patient has prior history of Echocardiogram examinations, most                 recent 06/01/2017. Risk Factors:Hypertension and Diabetes.  Sonographer:    Harvie Junior Referring Phys: [phone] [place]  1. Left ventricular  ejection fraction, by estimation, is 45 to 50%. The left ventricle has mildly decreased function. The left ventricle has no regional wall motion abnormalities. Left ventricular diastolic parameters are indeterminate.  2. Right ventricular systolic function is normal. The right ventricular size is normal. There is normal pulmonary artery systolic pressure.  3. The mitral valve is grossly normal. No evidence of mitral valve regurgitation. No evidence of mitral stenosis.  4. The aortic valve is grossly normal. Aortic valve regurgitation is not visualized. No aortic stenosis is present.  5. The  inferior vena cava is normal in size with <50% respiratory variability, suggesting right atrial pressure of 8 mmHg. Comparison(s): Changes from prior study are noted. EF mildly reduced on this study compared to prior, no focal wall motion abnormalities (globally mildly reduced). FINDINGS  Left Ventricle: Left ventricular ejection fraction, by estimation, is 45 to 50%. The left ventricle has mildly decreased function. The left ventricle has no regional wall motion abnormalities. The left ventricular internal cavity size was normal in size. There is no left ventricular hypertrophy. Left ventricular diastolic parameters are indeterminate. Right Ventricle: The right ventricular size is normal. No increase in right ventricular wall thickness. Right ventricular systolic function is normal. There is normal pulmonary artery systolic pressure. The tricuspid regurgitant velocity is 2.63 m/s, and  with an assumed right atrial pressure of 8 mmHg, the estimated right ventricular systolic pressure is 40.9 mmHg. Left Atrium: Left atrial size was normal in size. Right Atrium: Right atrial size was normal in size. Pericardium: There is no evidence of pericardial effusion. Mitral Valve: The mitral valve is grossly normal. No evidence of mitral valve regurgitation. No evidence of mitral valve stenosis. Tricuspid Valve: The tricuspid valve is normal  in structure. Tricuspid valve regurgitation is mild . No evidence of tricuspid stenosis. Aortic Valve: The aortic valve is grossly normal. Aortic valve regurgitation is not visualized. No aortic stenosis is present. Aortic valve mean gradient measures 5.0 mmHg. Aortic valve peak gradient measures 8.5 mmHg. Aortic valve area, by VTI measures 1.40 cm. Pulmonic Valve: The pulmonic valve was not well visualized. Pulmonic valve regurgitation is not visualized. No evidence of pulmonic stenosis. Aorta: The aortic root and ascending aorta are structurally normal, with no evidence of dilitation. Venous: The inferior vena cava is normal in size with less than 50% respiratory variability, suggesting right atrial pressure of 8 mmHg. IAS/Shunts: The atrial septum is grossly normal.  LEFT VENTRICLE PLAX 2D LVIDd:         4.10 cm     Diastology LVIDs:         3.20 cm     LV e' medial:    7.62 cm/s LV PW:         0.80 cm     LV E/e' medial:  9.8 LV IVS:        0.75 cm     LV e' lateral:   6.20 cm/s LVOT diam:     1.70 cm     LV E/e' lateral: 12.0 LV SV:         37 LV SV Index:   25 LVOT Area:     2.27 cm  LV Volumes (MOD) LV vol d, MOD A2C: 77.7 ml LV vol d, MOD A4C: 54.1 ml LV vol s, MOD A2C: 44.1 ml LV vol s, MOD A4C: 32.4 ml LV SV MOD A2C:     33.6 ml LV SV MOD A4C:     54.1 ml LV SV MOD BP:      27.4 ml RIGHT VENTRICLE RV Basal diam:  2.80 cm RV Mid diam:    2.20 cm TAPSE (M-mode): 1.6 cm LEFT ATRIUM             Index        RIGHT ATRIUM          Index LA diam:        2.90 cm 1.93 cm/m   RA Area:     7.72 cm LA Vol (A2C):   27.2 ml 18.14 ml/m  RA  Volume:   14.30 ml 9.54 ml/m LA Vol (A4C):   22.9 ml 15.27 ml/m LA Biplane Vol: 25.4 ml 16.94 ml/m  AORTIC VALVE                     PULMONIC VALVE AV Area (Vmax):    1.43 cm      PV Vmax:       0.91 m/s AV Area (Vmean):   1.43 cm      PV Peak grad:  3.3 mmHg AV Area (VTI):     1.40 cm AV Vmax:           146.00 cm/s AV Vmean:          108.000 cm/s AV VTI:            0.264 m  AV Peak Grad:      8.5 mmHg AV Mean Grad:      5.0 mmHg LVOT Vmax:         92.00 cm/s LVOT Vmean:        68.100 cm/s LVOT VTI:          0.163 m LVOT/AV VTI ratio: 0.62  AORTA Ao Root diam: 2.90 cm MITRAL VALVE               TRICUSPID VALVE MV Area (PHT): 3.53 cm    TR Peak grad:   27.7 mmHg MV Decel Time: 215 msec    TR Vmax:        263.00 cm/s MV E velocity: 74.60 cm/s MV A velocity: 81.80 cm/s  SHUNTS MV E/A ratio:  0.91        Systemic VTI:  0.16 m                            Systemic Diam: 1.70 cm Buford Dresser MD Electronically signed by Buford Dresser MD Signature Date/Time: 12/07/2021/4:20:28 PM    Final    DG CHEST PORT 1 VIEW  Result Date: 12/06/2021 CLINICAL DATA:  Status post central line placement EXAM: PORTABLE CHEST 1 VIEW COMPARISON:  Chest x-ray dated December 06, 2021 obtained at 6:18 p.m. FINDINGS: Slight interval retraction of ET tube which is approximately 1.0 cm from the carina. Interval placement of left IJ line with tip overlying the expected area of the mid SVC. Enteric tube partially seen coursing below the diaphragm. Right-greater-than-left heterogeneous opacities, unchanged. No large pleural effusion or evidence of pneumothorax. IMPRESSION: 1. Interval placement of left IJ line with tip overlying the expected area of the mid SVC. 2. Slight interval retraction of ET tube which is approximately 1.0 cm from the carina. Consider further retraction for optimal positioning. Electronically Signed   By: Yetta Glassman M.D.   On: 12/06/2021 19:44   DG Abd 1 View  Result Date: 12/06/2021 CLINICAL DATA:  Orogastric tube placement EXAM: ABDOMEN - 1 VIEW COMPARISON:  CT 12/05/2021 FINDINGS: Limited radiograph of the lower chest and upper abdomen was obtained for the purposes of enteric tube localization. Enteric tube is seen coursing below the diaphragm with distal tip and side port terminating within the expected location of the distal stomach. Endotracheal tube position in  the right mainstem bronchus, as seen on dedicated chest x-ray. IMPRESSION: Enteric tube tip and side port project within the expected location of the distal stomach. Electronically Signed   By: Davina Poke D.O.   On: 12/06/2021 19:00   DG CHEST PORT 1 VIEW  Result Date:  12/06/2021 CLINICAL DATA:  Intubation EXAM: PORTABLE CHEST 1 VIEW COMPARISON:  12/06/2021 at 0845 hours FINDINGS: Interval placement of endotracheal tube extending into the proximal right mainstem bronchus. Interval placement of enteric tube which is coiled within the stomach. Heart size within normal limits. Similar bilateral airspace opacities with progressive opacification of the left lung, likely superimposed atelectasis. No pneumothorax. IMPRESSION: 1. Interval placement of endotracheal tube extending into the proximal right mainstem bronchus. Recommend retraction approximately 4 cm. 2. Increasing left lung opacification, likely atelectasis. 3. Interval placement of enteric tube which is coiled within the stomach. These results will be called to the ordering clinician or representative by the Radiologist Assistant, and communication documented in the PACS or [employer]. Electronically Signed   By: Davina Poke D.O.   On: 12/06/2021 18:57    Assessment: 66 y.o. female   Sepsis and septic shock from left lower lobe pneumonia and aspiration pneumonia Hypoxic respiratory failure secondary to pneumonia Neutropenic fever Pancytopenia, secondary to MDS and chemo Hyponatremia Type 2 diabetes and hyperglycemia History of breast cancer HTN    Plan:  -Patient is critical ill, on life support, her prognosis is very poor. -Reviewed her peripheral smear with pathologist Dr. Gari Crown today, she has profound neutropenia, not many white blood cell on the peripheral smear to review, one blast cell was seen.  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  Unfortunately her neutropenia would not  recover soon, and G-CSF is relatively contraindicated due to her high risk of acute leukemia transformation, although I do not have high suspicion that if she is in acute leukemia phase at this point. -Continue broad antibiotics, per primary team and ID. 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  Median survival for high risk MDS is 0.8-year -I spoke with Dr. Carlis Abbott today, will continue medical treatment and supportive care for now.  If her overall condition continues getting worse in the next week, we will discuss with family to see they are open to comfort care  -I spoke with her daughter [name] [facility]) today on the phone and reviewed the above with her. She voiced good understanding, she would like to continue medical treatment for now, and revisit the CODE STATUS and next step in 4-5 days.  -continue blood transfusion to keep Hg>7 and plt>15K  -I will f/u    [name], MD 12/08/2021

## 2021-12-08 NOTE — Procedures (Signed)
Bronchoscopy Procedure Note  Amanda Duarte  [id number]  [birth year]  Date:12/08/21  Time:11:08 AM   Provider Performing:Anysha Frappier P Carlis Abbott   Procedure(s):  Flexible bronchoscopy with bronchial alveolar lavage [phone])  Indication(s) Assess for [facility], immunocompromised  Consent Risks of the procedure as well as the alternatives and risks of each were explained to the patient and/or caregiver.  Consent for the procedure was obtained and is signed in the bedside chart  Anesthesia Per MAR   Time Out Verified patient identification, verified procedure, site/side was marked, verified correct patient position, special equipment/implants available, medications/allergies/relevant history reviewed, required imaging and test results available.   Sterile Technique Usual hand hygiene, masks, gowns, and gloves were used   Procedure Description Bronchoscope advanced through endotracheal tube and into airway.  Airways were examined down to subsegmental level with findings noted below.   Following diagnostic evaluation, serial BAL(s) performed in LLL with normal saline and return of ~15cc fluid  Findings: bloody return persisted in all 3 aliquots, no real change in color     Complications/Tolerance None; patient tolerated the procedure well. Chest X-ray is needed post procedure.   EBL Minimal   Specimen(s) PJP DFA Routine resp culture  Julian Hy, DO 12/08/21 11:11 AM [facility]

## 2021-12-08 NOTE — Progress Notes (Signed)
Spoke to Dr. Oletta Darter from [place] about the hyperglycemic protocol. No order to administer dextrose at this time.

## 2021-12-08 NOTE — Progress Notes (Signed)
[place] Progress Note Patient Name: TITA TERHAAR DOB: 1955/12/08 MRN: [id number]   Date of Service  12/08/2021  HPI/Events of Note  QTC appeared to become prolonged on monitor after patient received iv Versed (it is not usually a cause of prolonged QT).  eICU Interventions  12 lead EKG ordered, electrolytes sent (including ionized calcium).        Araiya Tilmon U Dyonna Jaspers 12/08/2021, 4:30 AM

## 2021-12-08 NOTE — Progress Notes (Signed)
[place] at [place] notified of patient's soft SBP in the the 80's-90's. Provider will be notified by e-link nurse and an update will be given to this nurse.

## 2021-12-08 NOTE — Progress Notes (Signed)
[facility] for Infectious Disease  Date of Admission:  12/01/2021           Reason for visit: Follow up on multifocal pneumonia, neutropenic fever   Current antibiotics: Vancomycin Meropenem Voriconazole     ASSESSMENT:    66 y.o. female admitted with:  # Multifocal pneumonia with hypoxia and neutropenic fever, ARDS -- admitted 12/01/21 with multifocal pneumonia.  She has not responded well to antibiotics.  Seen by PCCM early in admission and noted to be on room air 12/03/21.  She continued to decline however with worsening oxygen requirements.  Seen by ID 11/12 in consult and broadened to meropenem from cefepime.  She was kept on vancomycin given instability despite MRSA PCR being negative.  Sputum cultures pending (Gram stain polymicrobial).  She was intubated on 11/13 in the evening due to progressive hypoxia and increased WOB.  BAL cultures are pending, Aspergillus Ag pending, urine Histo Ag pending, Fungitell pending.  There was no evidence of DAH at the time of bronchoscopy.  ? Aspiration pneumonia/pneumonitis vs leukemic infiltrates vs DAH despite not seen on initial BAL.   # Pancytopenia secondary to MDS and chemotherapy -- remains profoundly neutropenic and thrombocytopenic.  Slightly improved today.    # Uncontrolled DM -- A1c in September was 13.3.  Has been hyperglycemic.  # Torsades, prolonged QTc.  RECOMMENDATIONS:    Continue meropenem Continue vancomycin Follow pending cultures Follow fungal serologies  Defer further BAL to CCM for reassessment of DAH.  ? Leukemic infiltrates Will stop voriconazole given QTc issues and start back micafungin.  This will not provide much in terms of aspergillus/mold coverage but she is at risk for other fungal infection given prolonged neutropenia. If aspergillus unexpectedly is positive, will reassess adding back azole therapy Will follow.  Dr Linus Salmons here Thursday and Friday   Principal Problem:   Sepsis with acute organ  dysfunction without septic shock [facility]) Active Problems:   Essential hypertension   DM2 (diabetes mellitus, type 2) (HCC)   Malignant neoplasm of upper-inner quadrant of left breast in female, estrogen receptor positive ([place])   HLD (hyperlipidemia)   Pancytopenia (HCC)   Hyponatremia   Hypokalemia   MDS (myelodysplastic syndrome), high grade (HCC)   GERD (gastroesophageal reflux disease)   Acute respiratory failure with hypoxia (HCC)   On mechanically assisted ventilation (HCC)   ARDS (adult respiratory distress syndrome) (HCC)   Septic shock (HCC)   Lobar pneumonia, unspecified organism (HCC)   Malnutrition of moderate degree   Aspiration pneumonia of left lower lobe due to gastric secretions (HCC)    MEDICATIONS:    Scheduled Meds:  sodium chloride   Intravenous Once   sodium chloride   Intravenous Once   Chlorhexidine Gluconate Cloth  6 each Topical Daily   docusate  100 mg Per Tube BID   feeding supplement (PROSource TF20)  60 mL Per Tube BID   insulin aspart  0-15 Units Subcutaneous Q4H   insulin aspart  5 Units Subcutaneous Q4H   insulin detemir  20 Units Subcutaneous Daily   mouth rinse  15 mL Mouth Rinse Q2H   pantoprazole (PROTONIX) IV  40 mg Intravenous Daily   polyethylene glycol  17 g Per Tube Daily   potassium chloride  40 mEq Oral Daily   Thrombi-Pad  1 each Topical Once   Continuous Infusions:  sodium chloride Stopped (12/07/21 1409)   feeding supplement (OSMOLITE 1.2 CAL) 45 mL/hr at 12/08/21 0650   fentaNYL infusion INTRAVENOUS  100 mcg/hr (12/08/21 0650)   meropenem (MERREM) IV Stopped (12/08/21 4503)   norepinephrine (LEVOPHED) Adult infusion 2 mcg/min (12/08/21 0705)   vancomycin Stopped (12/08/21 0012)   voriconazole     PRN Meds:.sodium chloride, acetaminophen (TYLENOL) oral liquid 160 mg/5 mL, acetaminophen **OR** acetaminophen, albuterol, fentaNYL, guaiFENesin, midazolam, mouth rinse  SUBJECTIVE:   24 hour events:  Overnight events noted with  episode VT, frequent PVC, prolonged QTc Remains febrile CXR unchanged Cx pending Vanco, meropenem Voriconazole started yesterday for fungal coverage.  Maybe be problematic with cardiac issues Platelets 10 ANC 500 Creatinine 0.62 Hyperglycemic   Remains intubated, sedated.  Review of Systems  Unable to perform ROS: Intubated      OBJECTIVE:   Blood pressure (!) 133/46, pulse 82, temperature 98.8 F (37.1 C), resp. rate 14, height 5' (1.524 m), weight 55.9 kg, SpO2 96 %. Body mass index is 24.07 kg/m.  Physical Exam Constitutional:      Comments: Ill appearing, intubated, sedated  HENT:     Head: Normocephalic and atraumatic.  Neck:     Comments: ET tube in place Left IJ CVC Pulmonary:     Comments: Ventilated, coarse breath sounds Abdominal:     General: There is no distension.     Palpations: Abdomen is soft.     Tenderness: There is no abdominal tenderness.  Musculoskeletal:     Cervical back: Neck supple.     Right lower leg: No edema.     Left lower leg: No edema.  Skin:    General: Skin is warm and dry.     Findings: No rash.  Neurological:     Comments: Sedated      Lab Results: Lab Results  Component Value Date   WBC 1.2 (LL) 12/08/2021   HGB 8.0 (L) 12/08/2021   HCT 23.6 (L) 12/08/2021   MCV 90.4 12/08/2021   PLT 10 (LL) 12/08/2021    Lab Results  Component Value Date   NA 141 12/08/2021   K 3.5 12/08/2021   CO2 25 12/08/2021   GLUCOSE 363 (H) 12/08/2021   BUN 56 (H) 12/08/2021   CREATININE 0.62 12/08/2021   CALCIUM 8.2 (L) 12/08/2021   GFRNONAA >60 12/08/2021   GFRAA >60 10/08/2019    Lab Results  Component Value Date   ALT 9 12/04/2021   AST 16 12/04/2021   ALKPHOS 39 12/04/2021   BILITOT 0.8 12/04/2021       Component Value Date/Time   CRP 21.2 (H) 12/02/2021 0907    No results found for: "ESRSEDRATE"   I have reviewed the micro and lab results in Epic.  Imaging: DG CHEST PORT 1 VIEW  Result Date:  12/08/2021 CLINICAL DATA:  [id number] with acute respiratory failure with hypoxia and ventilator dependence. EXAM: PORTABLE CHEST 1 VIEW COMPARISON:  Portable 12/06/2021 FINDINGS: 4:41 a.m. ETT interval pullback to 2.6 cm from the carina. The left IJ central line again terminates in the distal SVC. NGT is partially visible coiled in the stomach. Neither the side-hole or the tip of the tube are filmed. The cardiac size is normal. Patchy dense opacities of the left mid and both lower lung fields appear similar, denser on the left as before and with small underlying left pleural effusion. The right mid and both apical lung regions remain generally clear. Overall aeration seems unchanged. The mediastinal configuration is normal. There are left axillary surgical clips, multiple overlying monitor wires. No acute osseous findings. IMPRESSION: 1. ETT interval pullback to 2.6 cm from the  carina. 2. NGT coiled in the stomach. 3. Left IJ central line again terminates in the distal SVC. 4. No interval change in the appearance of the lungs. Electronically Signed   By: Telford Nab M.D.   On: 12/08/2021 07:32   ECHOCARDIOGRAM COMPLETE  Result Date: 12/07/2021    ECHOCARDIOGRAM REPORT   Patient Name:   JENNELL JANOSIK Date of Exam: 12/07/2021 Medical Rec #:  [id number]   Height:       60.0 in Accession #:    [id number]  Weight:       119.5 lb Date of Birth:  [birth year]    BSA:          1.500 m Patient Age:    19 years    BP:           108/43 mmHg Patient Gender: F           HR:           86 bpm. Exam Location:  Inpatient Procedure: 2D Echo Indications:    Fever  History:        Patient has prior history of Echocardiogram examinations, most                 recent 06/01/2017. Risk Factors:Hypertension and Diabetes.  Sonographer:    Harvie Junior Referring Phys: [phone] [place]  1. Left ventricular ejection fraction, by estimation, is 45 to 50%. The left ventricle has mildly decreased function. The left ventricle has no  regional wall motion abnormalities. Left ventricular diastolic parameters are indeterminate.  2. Right ventricular systolic function is normal. The right ventricular size is normal. There is normal pulmonary artery systolic pressure.  3. The mitral valve is grossly normal. No evidence of mitral valve regurgitation. No evidence of mitral stenosis.  4. The aortic valve is grossly normal. Aortic valve regurgitation is not visualized. No aortic stenosis is present.  5. The inferior vena cava is normal in size with <50% respiratory variability, suggesting right atrial pressure of 8 mmHg. Comparison(s): Changes from prior study are noted. EF mildly reduced on this study compared to prior, no focal wall motion abnormalities (globally mildly reduced). FINDINGS  Left Ventricle: Left ventricular ejection fraction, by estimation, is 45 to 50%. The left ventricle has mildly decreased function. The left ventricle has no regional wall motion abnormalities. The left ventricular internal cavity size was normal in size. There is no left ventricular hypertrophy. Left ventricular diastolic parameters are indeterminate. Right Ventricle: The right ventricular size is normal. No increase in right ventricular wall thickness. Right ventricular systolic function is normal. There is normal pulmonary artery systolic pressure. The tricuspid regurgitant velocity is 2.63 m/s, and  with an assumed right atrial pressure of 8 mmHg, the estimated right ventricular systolic pressure is 29.5 mmHg. Left Atrium: Left atrial size was normal in size. Right Atrium: Right atrial size was normal in size. Pericardium: There is no evidence of pericardial effusion. Mitral Valve: The mitral valve is grossly normal. No evidence of mitral valve regurgitation. No evidence of mitral valve stenosis. Tricuspid Valve: The tricuspid valve is normal in structure. Tricuspid valve regurgitation is mild . No evidence of tricuspid stenosis. Aortic Valve: The aortic valve is  grossly normal. Aortic valve regurgitation is not visualized. No aortic stenosis is present. Aortic valve mean gradient measures 5.0 mmHg. Aortic valve peak gradient measures 8.5 mmHg. Aortic valve area, by VTI measures 1.40 cm. Pulmonic Valve: The pulmonic valve was not well visualized. Pulmonic valve regurgitation  is not visualized. No evidence of pulmonic stenosis. Aorta: The aortic root and ascending aorta are structurally normal, with no evidence of dilitation. Venous: The inferior vena cava is normal in size with less than 50% respiratory variability, suggesting right atrial pressure of 8 mmHg. IAS/Shunts: The atrial septum is grossly normal.  LEFT VENTRICLE PLAX 2D LVIDd:         4.10 cm     Diastology LVIDs:         3.20 cm     LV e' medial:    7.62 cm/s LV PW:         0.80 cm     LV E/e' medial:  9.8 LV IVS:        0.75 cm     LV e' lateral:   6.20 cm/s LVOT diam:     1.70 cm     LV E/e' lateral: 12.0 LV SV:         37 LV SV Index:   25 LVOT Area:     2.27 cm  LV Volumes (MOD) LV vol d, MOD A2C: 77.7 ml LV vol d, MOD A4C: 54.1 ml LV vol s, MOD A2C: 44.1 ml LV vol s, MOD A4C: 32.4 ml LV SV MOD A2C:     33.6 ml LV SV MOD A4C:     54.1 ml LV SV MOD BP:      27.4 ml RIGHT VENTRICLE RV Basal diam:  2.80 cm RV Mid diam:    2.20 cm TAPSE (M-mode): 1.6 cm LEFT ATRIUM             Index        RIGHT ATRIUM          Index LA diam:        2.90 cm 1.93 cm/m   RA Area:     7.72 cm LA Vol (A2C):   27.2 ml 18.14 ml/m  RA Volume:   14.30 ml 9.54 ml/m LA Vol (A4C):   22.9 ml 15.27 ml/m LA Biplane Vol: 25.4 ml 16.94 ml/m  AORTIC VALVE                     PULMONIC VALVE AV Area (Vmax):    1.43 cm      PV Vmax:       0.91 m/s AV Area (Vmean):   1.43 cm      PV Peak grad:  3.3 mmHg AV Area (VTI):     1.40 cm AV Vmax:           146.00 cm/s AV Vmean:          108.000 cm/s AV VTI:            0.264 m AV Peak Grad:      8.5 mmHg AV Mean Grad:      5.0 mmHg LVOT Vmax:         92.00 cm/s LVOT Vmean:        68.100 cm/s LVOT  VTI:          0.163 m LVOT/AV VTI ratio: 0.62  AORTA Ao Root diam: 2.90 cm MITRAL VALVE               TRICUSPID VALVE MV Area (PHT): 3.53 cm    TR Peak grad:   27.7 mmHg MV Decel Time: 215 msec    TR Vmax:        263.00 cm/s MV E velocity: 74.60 cm/s MV A velocity: 81.80  cm/s  SHUNTS MV E/A ratio:  0.91        Systemic VTI:  0.16 m                            Systemic Diam: 1.70 cm Buford Dresser MD Electronically signed by Buford Dresser MD Signature Date/Time: 12/07/2021/4:20:28 PM    Final    DG CHEST PORT 1 VIEW  Result Date: 12/06/2021 CLINICAL DATA:  Status post central line placement EXAM: PORTABLE CHEST 1 VIEW COMPARISON:  Chest x-ray dated December 06, 2021 obtained at 6:18 p.m. FINDINGS: Slight interval retraction of ET tube which is approximately 1.0 cm from the carina. Interval placement of left IJ line with tip overlying the expected area of the mid SVC. Enteric tube partially seen coursing below the diaphragm. Right-greater-than-left heterogeneous opacities, unchanged. No large pleural effusion or evidence of pneumothorax. IMPRESSION: 1. Interval placement of left IJ line with tip overlying the expected area of the mid SVC. 2. Slight interval retraction of ET tube which is approximately 1.0 cm from the carina. Consider further retraction for optimal positioning. Electronically Signed   By: Yetta Glassman M.D.   On: 12/06/2021 19:44   DG Abd 1 View  Result Date: 12/06/2021 CLINICAL DATA:  Orogastric tube placement EXAM: ABDOMEN - 1 VIEW COMPARISON:  CT 12/05/2021 FINDINGS: Limited radiograph of the lower chest and upper abdomen was obtained for the purposes of enteric tube localization. Enteric tube is seen coursing below the diaphragm with distal tip and side port terminating within the expected location of the distal stomach. Endotracheal tube position in the right mainstem bronchus, as seen on dedicated chest x-ray. IMPRESSION: Enteric tube tip and side port project within  the expected location of the distal stomach. Electronically Signed   By: Davina Poke D.O.   On: 12/06/2021 19:00   DG CHEST PORT 1 VIEW  Result Date: 12/06/2021 CLINICAL DATA:  Intubation EXAM: PORTABLE CHEST 1 VIEW COMPARISON:  12/06/2021 at 0845 hours FINDINGS: Interval placement of endotracheal tube extending into the proximal right mainstem bronchus. Interval placement of enteric tube which is coiled within the stomach. Heart size within normal limits. Similar bilateral airspace opacities with progressive opacification of the left lung, likely superimposed atelectasis. No pneumothorax. IMPRESSION: 1. Interval placement of endotracheal tube extending into the proximal right mainstem bronchus. Recommend retraction approximately 4 cm. 2. Increasing left lung opacification, likely atelectasis. 3. Interval placement of enteric tube which is coiled within the stomach. These results will be called to the ordering clinician or representative by the Radiologist Assistant, and communication documented in the PACS or [employer]. Electronically Signed   By: Davina Poke D.O.   On: 12/06/2021 18:57   DG CHEST PORT 1 VIEW  Result Date: 12/06/2021 CLINICAL DATA:  Pneumonia EXAM: PORTABLE CHEST 1 VIEW COMPARISON:  Previous studies including the chest radiograph done on 12/02/2021 and CT done on 12/05/2021 FINDINGS: Transverse diameter of heart is increased. Large alveolar infiltrates are seen in left upper, mid and lower lung fields with interval worsening. There are new patchy infiltrates in right lung. Right lateral CP angle is clear. There is no pneumothorax. Surgical clips are seen in left axilla and left chest wall. IMPRESSION: Cardiomegaly. Extensive infiltrates are seen in left lung with interval progression. There are multiple patchy infiltrates in the right lung. Findings suggest interval worsening of bilateral multifocal pneumonia. Electronically Signed   By: Elmer Picker M.D.   On:  12/06/2021 09:46  Imaging independently reviewed in Epic.    Raynelle [facility] for Infectious Disease [facility] [phone] pager 12/08/2021, 8:09 AM

## 2021-12-08 NOTE — Progress Notes (Signed)
[place] Progress Note Patient Name: MILI PILTZ DOB: 1956-01-15 MRN: [id number]   Date of Service  12/08/2021  HPI/Events of Note  CVP = 8 and Albumin = 2.1.   eICU Interventions  Plan: 25% Albumin 25 gm IV X 1.      Intervention Category Major Interventions: Hypotension - evaluation and management  Lysle Dingwall 12/08/2021, 9:23 PM

## 2021-12-08 NOTE — Patient Outreach (Signed)
  Care Coordination   Follow Up Visit Note   12/08/2021 Name: Amanda Duarte MRN: [id number] DOB: 1955-03-25  Amanda Duarte is a 66 y.o. year old female who sees [name], NP for primary care. I  performed a chart review in preparation to contact patient today.   What matters to the patients health and wellness today?  Patient is currently inpatient at [facility].     Goals Addressed             This Visit's Progress    RN Care Coordination Activities: further follow up needed       Care Coordination Interventions: Reviewed chart in preparation to contact patient, noted inpatient admission to [facility] on 12/01/21 to present; dx; Sepsis with acute organ dysfunction without septic shock            SDOH assessments and interventions completed:  No     Care Coordination Interventions Activated:  Yes  Care Coordination Interventions:  Yes, provided   Follow up plan: Follow up call scheduled for 12/22/21 '@1130'$  AM    Encounter Outcome:  Pt. Visit Completed

## 2021-12-08 NOTE — Progress Notes (Signed)
[place] Progress Note Patient Name: BAMBI FEHNEL DOB: 03-Dec-1955 MRN: [id number]   Date of Service  12/08/2021  HPI/Events of Note  Patient with sub-optimal glycemic control.  eICU Interventions  Novolog 10 units SQ x 1, enteral feeding Insulin coverage dose increased from 2 to 5 units.        Cyndel Griffey U Keidra Withers 12/08/2021, 1:50 AM

## 2021-12-08 NOTE — Inpatient Diabetes Management (Signed)
Inpatient Diabetes Program Recommendations  AACE/ADA: New Consensus Statement on Inpatient Glycemic Control (2015)  Target Ranges:  Prepandial:   less than 140 mg/dL      Peak postprandial:   less than 180 mg/dL (1-2 hours)      Critically ill patients:  140 - 180 mg/dL   Lab Results  Component Value Date   GLUCAP 328 (H) 12/08/2021   HGBA1C 11.4 (A) 10/28/2021    Review of Glycemic Control  Latest Reference Range & Units 12/07/21 23:47 12/08/21 03:45 12/08/21 08:20  Glucose-Capillary 70 - 99 mg/dL 321 (H) 328 (H) 328 (H)  (H): Data is abnormally high Diabetes history: Type 2 DM Outpatient Diabetes medications: Janumet 50-1000 mg BID, Lantus 25 units BID, Humalog 4 units TID (NT), Amaryl 4 mg BID Current orders for Inpatient glycemic control: Levemir 20 units QD, Novolog 5 units Q4H, Novolog 0-15 units Q4H, Novolog 10 units x 1, IV insulin Osmolite @ 45 ml/hr  Inpatient Diabetes Program Recommendations:    Noted order changes with plan to start IV insulin per ICU protocol.  Feel that hyperglycemia was partially related to transition of tube feeds from Vital high protein to Osmolite as there were several hours of overlap per MAR.   In agreement with current plan, following.   Thanks, Bronson Curb, MSN, RNC-OB Diabetes Coordinator [phone] (8a-5p)

## 2021-12-09 ENCOUNTER — Ambulatory Visit: Payer: Medicare Other | Admitting: Pharmacist

## 2021-12-09 DIAGNOSIS — D469 Myelodysplastic syndrome, unspecified: Secondary | ICD-10-CM

## 2021-12-09 DIAGNOSIS — Z9911 Dependence on respirator [ventilator] status: Secondary | ICD-10-CM | POA: Diagnosis not present

## 2021-12-09 DIAGNOSIS — R579 Shock, unspecified: Secondary | ICD-10-CM | POA: Diagnosis not present

## 2021-12-09 DIAGNOSIS — J9601 Acute respiratory failure with hypoxia: Secondary | ICD-10-CM | POA: Diagnosis not present

## 2021-12-09 DIAGNOSIS — A419 Sepsis, unspecified organism: Secondary | ICD-10-CM | POA: Diagnosis not present

## 2021-12-09 DIAGNOSIS — R6520 Severe sepsis without septic shock: Secondary | ICD-10-CM | POA: Diagnosis not present

## 2021-12-09 LAB — GLUCOSE, CAPILLARY
Glucose-Capillary: 110 mg/dL — ABNORMAL HIGH (ref 70–99)
Glucose-Capillary: 137 mg/dL — ABNORMAL HIGH (ref 70–99)
Glucose-Capillary: 153 mg/dL — ABNORMAL HIGH (ref 70–99)
Glucose-Capillary: 156 mg/dL — ABNORMAL HIGH (ref 70–99)
Glucose-Capillary: 158 mg/dL — ABNORMAL HIGH (ref 70–99)
Glucose-Capillary: 169 mg/dL — ABNORMAL HIGH (ref 70–99)
Glucose-Capillary: 173 mg/dL — ABNORMAL HIGH (ref 70–99)
Glucose-Capillary: 174 mg/dL — ABNORMAL HIGH (ref 70–99)
Glucose-Capillary: 174 mg/dL — ABNORMAL HIGH (ref 70–99)
Glucose-Capillary: 179 mg/dL — ABNORMAL HIGH (ref 70–99)
Glucose-Capillary: 188 mg/dL — ABNORMAL HIGH (ref 70–99)
Glucose-Capillary: 190 mg/dL — ABNORMAL HIGH (ref 70–99)
Glucose-Capillary: 191 mg/dL — ABNORMAL HIGH (ref 70–99)
Glucose-Capillary: 204 mg/dL — ABNORMAL HIGH (ref 70–99)
Glucose-Capillary: 210 mg/dL — ABNORMAL HIGH (ref 70–99)
Glucose-Capillary: 212 mg/dL — ABNORMAL HIGH (ref 70–99)
Glucose-Capillary: 215 mg/dL — ABNORMAL HIGH (ref 70–99)
Glucose-Capillary: 237 mg/dL — ABNORMAL HIGH (ref 70–99)
Glucose-Capillary: 249 mg/dL — ABNORMAL HIGH (ref 70–99)

## 2021-12-09 LAB — BASIC METABOLIC PANEL
Anion gap: 6 (ref 5–15)
Anion gap: 9 (ref 5–15)
BUN: 60 mg/dL — ABNORMAL HIGH (ref 8–23)
BUN: 59 mg/dL — ABNORMAL HIGH (ref 8–23)
CO2: 24 mmol/L (ref 22–32)
CO2: 23 mmol/L (ref 22–32)
Calcium: 8.5 mg/dL — ABNORMAL LOW (ref 8.9–10.3)
Calcium: 8.8 mg/dL — ABNORMAL LOW (ref 8.9–10.3)
Chloride: 115 mmol/L — ABNORMAL HIGH (ref 98–111)
Chloride: 114 mmol/L — ABNORMAL HIGH (ref 98–111)
Creatinine, Ser: 0.46 mg/dL (ref 0.44–1.00)
Creatinine, Ser: 0.56 mg/dL (ref 0.44–1.00)
GFR, Estimated: >60 mL/min (ref >60)
GFR, Estimated: >60 mL/min (ref >60)
Glucose, Bld: 185 mg/dL — ABNORMAL HIGH (ref 70–99)
Glucose, Bld: 224 mg/dL — ABNORMAL HIGH (ref 70–99)
Potassium: 5.7 mmol/L — ABNORMAL HIGH (ref 3.5–5.1)
Potassium: 4.5 mmol/L (ref 3.5–5.1)
Sodium: 145 mmol/L (ref 135–145)
Sodium: 146 mmol/L — ABNORMAL HIGH (ref 135–145)

## 2021-12-09 LAB — BPAM PLATELET PHERESIS
Blood Product Expiration Date: 202311152359
ISSUE DATE / TIME: 202311151019
Unit Type and Rh: 7300

## 2021-12-09 LAB — VANCOMYCIN, RANDOM: Vancomycin Rm: 4 ug/mL

## 2021-12-09 LAB — CBC WITH DIFFERENTIAL/PLATELET
Abs Immature Granulocytes: 0 10*3/uL (ref 0.00–0.07)
Basophils Absolute: 0 10*3/uL (ref 0.0–0.1)
Basophils Relative: 2 %
Eosinophils Absolute: 0 10*3/uL (ref 0.0–0.5)
Eosinophils Relative: 0 %
HCT: 23.8 % — ABNORMAL LOW (ref 36.0–46.0)
Hemoglobin: 7.7 g/dL — ABNORMAL LOW (ref 12.0–15.0)
Immature Granulocytes: 0 %
Lymphocytes Relative: 66 %
Lymphs Abs: 0.9 10*3/uL (ref 0.7–4.0)
MCH: 30.6 pg (ref 26.0–34.0)
MCHC: 32.4 g/dL (ref 30.0–36.0)
MCV: 94.4 fL (ref 80.0–100.0)
Monocytes Absolute: 0.1 10*3/uL (ref 0.1–1.0)
Monocytes Relative: 4 %
Neutro Abs: 0.4 10*3/uL — CL (ref 1.7–7.7)
Neutrophils Relative %: 28 %
Platelets: 25 10*3/uL — CL (ref 150–400)
RBC: 2.52 MIL/uL — ABNORMAL LOW (ref 3.87–5.11)
RDW: 16.4 % — ABNORMAL HIGH (ref 11.5–15.5)
WBC: 1.4 10*3/uL — CL (ref 4.0–10.5)
nRBC: 0 % (ref 0.0–0.2)

## 2021-12-09 LAB — CBC
HCT: 22.8 % — ABNORMAL LOW (ref 36.0–46.0)
Hemoglobin: 7.3 g/dL — ABNORMAL LOW (ref 12.0–15.0)
MCH: 30.2 pg (ref 26.0–34.0)
MCHC: 32 g/dL (ref 30.0–36.0)
MCV: 94.2 fL (ref 80.0–100.0)
Platelets: 9 10*3/uL — CL (ref 150–400)
RBC: 2.42 MIL/uL — ABNORMAL LOW (ref 3.87–5.11)
RDW: 16.3 % — ABNORMAL HIGH (ref 11.5–15.5)
WBC: 1.4 10*3/uL — CL (ref 4.0–10.5)
nRBC: 0 % (ref 0.0–0.2)

## 2021-12-09 LAB — PREPARE PLATELET PHERESIS: Unit division: 0

## 2021-12-09 LAB — VANCOMYCIN, PEAK: Vancomycin Pk: 20 ug/mL — ABNORMAL LOW (ref 30–40)

## 2021-12-09 LAB — CULTURE, RESPIRATORY W GRAM STAIN: Culture: NO GROWTH

## 2021-12-09 LAB — ACID FAST SMEAR (AFB, MYCOBACTERIA): Acid Fast Smear: NEGATIVE

## 2021-12-09 LAB — MAGNESIUM: Magnesium: 2.7 mg/dL — ABNORMAL HIGH (ref 1.7–2.4)

## 2021-12-09 LAB — CALCIUM, IONIZED: Calcium, Ionized, Serum: 5.3 mg/dL (ref 4.5–5.6)

## 2021-12-09 LAB — PHOSPHORUS: Phosphorus: 3.9 mg/dL (ref 2.5–4.6)

## 2021-12-09 LAB — PNEUMOCYSTIS JIROVECI SMEAR BY DFA: Pneumocystis jiroveci Ag: NEGATIVE

## 2021-12-09 MED ORDER — VANCOMYCIN HCL IN DEXTROSE 1-5 GM/200ML-% IV SOLN
1000.0000 mg | Freq: Two times a day (BID) | INTRAVENOUS | Status: DC
  Administered 2021-12-09 – 2021-12-12 (×6): 1000 mg via INTRAVENOUS
  Filled 2021-12-09 (×6): qty 200

## 2021-12-09 MED ORDER — HYDRALAZINE HCL 20 MG/ML IJ SOLN
10.0000 mg | INTRAMUSCULAR | Status: DC | PRN
  Administered 2021-12-15: 10 mg via INTRAVENOUS
  Filled 2021-12-09: qty 1

## 2021-12-09 MED ORDER — SODIUM CHLORIDE 0.9% IV SOLUTION: Freq: Once | INTRAVENOUS | Status: AC

## 2021-12-09 MED ORDER — SODIUM ZIRCONIUM CYCLOSILICATE 10 G PO PACK
10.0000 g | PACK | Freq: Once | ORAL | Status: AC
  Administered 2021-12-09: 10 g
  Filled 2021-12-09: qty 1

## 2021-12-09 MED ORDER — INSULIN DETEMIR 100 UNIT/ML ~~LOC~~ SOLN
20.0000 [IU] | Freq: Two times a day (BID) | SUBCUTANEOUS | Status: DC
  Administered 2021-12-10 – 2021-12-15 (×12): 20 [IU] via SUBCUTANEOUS
  Filled 2021-12-09 (×15): qty 0.2

## 2021-12-09 MED ORDER — INSULIN DETEMIR 100 UNIT/ML ~~LOC~~ SOLN
20.0000 [IU] | Freq: Once | SUBCUTANEOUS | Status: AC
  Administered 2021-12-09: 20 [IU] via SUBCUTANEOUS
  Filled 2021-12-09: qty 0.2

## 2021-12-09 MED ORDER — INSULIN ASPART 100 UNIT/ML IJ SOLN
3.0000 [IU] | INTRAMUSCULAR | Status: DC
  Administered 2021-12-09: 6 [IU] via SUBCUTANEOUS
  Administered 2021-12-09 (×2): 9 [IU] via SUBCUTANEOUS

## 2021-12-09 MED ORDER — INSULIN ASPART 100 UNIT/ML IJ SOLN
9.0000 [IU] | INTRAMUSCULAR | Status: DC
  Administered 2021-12-09 (×3): 9 [IU] via SUBCUTANEOUS

## 2021-12-09 MED ORDER — INSULIN DETEMIR 100 UNIT/ML ~~LOC~~ SOLN: 26.0000 [IU] | Freq: Two times a day (BID) | SUBCUTANEOUS | Status: DC

## 2021-12-09 MED ORDER — DEXTROSE 10 % IV SOLN: INTRAVENOUS | Status: DC | PRN

## 2021-12-09 MED ORDER — INSULIN ASPART 100 UNIT/ML IJ SOLN
0.0000 [IU] | INTRAMUSCULAR | Status: DC
  Administered 2021-12-09: 3 [IU] via SUBCUTANEOUS
  Administered 2021-12-10 (×2): 2 [IU] via SUBCUTANEOUS
  Administered 2021-12-10: 15 [IU] via SUBCUTANEOUS
  Administered 2021-12-10 (×2): 3 [IU] via SUBCUTANEOUS

## 2021-12-09 MED ORDER — INSULIN DETEMIR 100 UNIT/ML ~~LOC~~ SOLN
26.0000 [IU] | Freq: Two times a day (BID) | SUBCUTANEOUS | Status: DC
  Administered 2021-12-09: 26 [IU] via SUBCUTANEOUS
  Filled 2021-12-09 (×3): qty 0.26

## 2021-12-09 NOTE — Progress Notes (Signed)
eLink Physician-Brief Progress Note Patient Name: Amanda Duarte DOB: 1955/03/19 MRN: [id number]   Date of Service  12/09/2021  HPI/Events of Note  Currently on an insulin IV infusion at 5.5 units/hour. Will transition off of insulin IV infusion.   eICU Interventions  Will transition per order set to: Levemir 26 units [place] Q 12 hours. Q 4 hour resistant Novolog SSI + Q 4 hour tube feed Novolog insulin.      Intervention Category Major Interventions: Hyperglycemia - active titration of insulin therapy  Dori Devino Eugene 12/09/2021, 3:24 AM

## 2021-12-09 NOTE — Progress Notes (Signed)
eLink Physician-Brief Progress Note Patient Name: Amanda Duarte DOB: December 22, 1955 MRN: [id number]   Date of Service  12/09/2021  HPI/Events of Note  Multiple issues: 1. Hyperkalemia - K+ = 5.7 and 2. Platelets = 9.  eICU Interventions  Plan: Lokelma 10 gm per tube now. Repeat BMP at 12 noon. Transfuse 1 unit single donor platelets. Repeat CBC with platelets at 12 noon.      Intervention Category Major Interventions: Other:;Electrolyte abnormality - evaluation and management  Drina Jobst Eugene 12/09/2021, 5:07 AM

## 2021-12-09 NOTE — Progress Notes (Signed)
Patient currently NPO, TF stopped thus RN spoke with Dr Oletta Darter regarding Insulin Novolog, Levemir and D10 IVF order clarification. MD adjusted orders, please see MAR. As per MD, continue to monitor BS, if it trends low, call MD for IVF.

## 2021-12-09 NOTE — Progress Notes (Signed)
[facility] for Infectious Disease   Reason for visit: Follow up on multifocal pneumonia  Interval History: extubated this am; daughters at bedside; Tmax 100.2 over last 24 hours Day 9 total antibiotics  Physical Exam: Constitutional:  Vitals:   12/09/21 0800 12/09/21 0900  BP: (!) 121/51 (!) 138/50  Pulse: 71 74  Resp: (!) 30 (!) 27  Temp: 100 F (37.8 C) 100.2 F (37.9 C)  SpO2: 99% 98%   patient appears in NAD Eyes: anicteric Respiratory: Normal respiratory effort  Review of Systems: Unable to be assessed due to patient factors  Lab Results  Component Value Date   WBC 1.4 (LL) 12/09/2021   HGB 7.7 (L) 12/09/2021   HCT 23.8 (L) 12/09/2021   MCV 94.4 12/09/2021   PLT 25 (LL) 12/09/2021    Lab Results  Component Value Date   CREATININE 0.56 12/09/2021   BUN 59 (H) 12/09/2021   NA 146 (H) 12/09/2021   K 4.5 12/09/2021   CL 114 (H) 12/09/2021   CO2 23 12/09/2021    Lab Results  Component Value Date   ALT 9 12/04/2021   AST 16 12/04/2021   ALKPHOS 39 12/04/2021     Microbiology: Recent Results (from the past 240 hour(s))  Blood Culture (routine x 2)     Status: None   Collection Time: 12/01/21 11:13 PM   Specimen: BLOOD  Result Value Ref Range Status   Specimen Description   Final    BLOOD RIGHT ANTECUBITAL Performed at [facility], [place] [address] [place], [place] [address]    Special Requests   Final    BOTTLES DRAWN AEROBIC AND ANAEROBIC Blood Culture results may not be optimal due to an excessive volume of blood received in culture bottles Performed at [facility] [address] [place], [place] [address]    Culture   Final    NO GROWTH 5 DAYS Performed at [facility], [place] [address], [place], [place] [address]    Report Status 12/07/2021 FINAL  Final  Blood Culture (routine x 2)     Status: None   Collection Time: 12/01/21 11:13 PM   Specimen: BLOOD  Result Value Ref Range Status    Specimen Description   Final    BLOOD SITE NOT SPECIFIED Performed at [facility] [address], [place], [place] [address]    Special Requests   Final    NONE Performed at [facility], [place] [address] [place], [place] [address]    Culture   Final    NO GROWTH 5 DAYS Performed at [facility], [place] [address] [place],  [address]    Report Status 12/07/2021 FINAL  Final  Resp Panel by RT-PCR (Flu A&B, Covid) Anterior Nasal Swab     Status: None   Collection Time: 12/01/21 11:25 PM   Specimen: Anterior Nasal Swab  Result Value Ref Range Status   SARS Coronavirus 2 by RT PCR NEGATIVE NEGATIVE Final    Comment: (NOTE) SARS-CoV-2 target nucleic acids are NOT DETECTED.  The SARS-CoV-2 RNA is generally detectable in upper respiratory specimens during the acute phase of infection. The lowest concentration of SARS-CoV-2 viral copies this assay can detect is 138 copies/mL. A negative result does not preclude SARS-Cov-2 infection and should not be used as the sole basis for treatment or other patient management decisions. A negative result may occur with  improper specimen collection/handling, submission of specimen other  than nasopharyngeal swab, presence of viral mutation(s) within the areas targeted by this assay, and inadequate number of viral copies(<138 copies/mL). A negative result must be combined with clinical observations, patient history, and epidemiological information. The expected result is Negative.  Fact Sheet for Patients:  EntrepreneurPulse.com.au  Fact Sheet for Healthcare Providers:  IncredibleEmployment.be  This test is no t yet approved or cleared by the [place] FDA and  has been authorized for detection and/or diagnosis of SARS-CoV-2 by FDA under an Emergency Use Authorization (EUA). This EUA will remain  in effect (meaning this test can be used) for the duration of  the COVID-19 declaration under Section 564(b)(1) of the Act, 21 U.S.C.section 360bbb-3(b)(1), unless the authorization is terminated  or revoked sooner.       Influenza A by PCR NEGATIVE NEGATIVE Final   Influenza B by PCR NEGATIVE NEGATIVE Final    Comment: (NOTE) The Xpert Xpress SARS-CoV-2/FLU/RSV plus assay is intended as an aid in the diagnosis of influenza from Nasopharyngeal swab specimens and should not be used as a sole basis for treatment. Nasal washings and aspirates are unacceptable for Xpert Xpress SARS-CoV-2/FLU/RSV testing.  Fact Sheet for Patients: EntrepreneurPulse.com.au  Fact Sheet for Healthcare Providers: IncredibleEmployment.be  This test is not yet approved or cleared by the [place] FDA and has been authorized for detection and/or diagnosis of SARS-CoV-2 by FDA under an Emergency Use Authorization (EUA). This EUA will remain in effect (meaning this test can be used) for the duration of the COVID-19 declaration under Section 564(b)(1) of the Act, 21 U.S.C. section 360bbb-3(b)(1), unless the authorization is terminated or revoked.  Performed at [facility], [place] [address], [place], [place] [address]   Respiratory (~20 pathogens) panel by PCR     Status: None   Collection Time: 12/01/21 11:25 PM   Specimen: Nasopharyngeal Swab; Respiratory  Result Value Ref Range Status   Adenovirus NOT DETECTED NOT DETECTED Final   Coronavirus 229E NOT DETECTED NOT DETECTED Final    Comment: (NOTE) The Coronavirus on the Respiratory Panel, DOES NOT test for the novel  Coronavirus (2019 nCoV)    Coronavirus HKU1 NOT DETECTED NOT DETECTED Final   Coronavirus NL63 NOT DETECTED NOT DETECTED Final   Coronavirus OC43 NOT DETECTED NOT DETECTED Final   Metapneumovirus NOT DETECTED NOT DETECTED Final   Rhinovirus / Enterovirus NOT DETECTED NOT DETECTED Final   Influenza A NOT DETECTED NOT DETECTED Final    Influenza B NOT DETECTED NOT DETECTED Final   Parainfluenza Virus 1 NOT DETECTED NOT DETECTED Final   Parainfluenza Virus 2 NOT DETECTED NOT DETECTED Final   Parainfluenza Virus 3 NOT DETECTED NOT DETECTED Final   Parainfluenza Virus 4 NOT DETECTED NOT DETECTED Final   Respiratory Syncytial Virus NOT DETECTED NOT DETECTED Final   Bordetella pertussis NOT DETECTED NOT DETECTED Final   Bordetella Parapertussis NOT DETECTED NOT DETECTED Final   Chlamydophila pneumoniae NOT DETECTED NOT DETECTED Final   Mycoplasma pneumoniae NOT DETECTED NOT DETECTED Final    Comment: Performed at [facility], [place]. [address], [place], [place] [address]  Urine Culture     Status: Abnormal   Collection Time: 12/02/21  3:00 AM   Specimen: In/Out Cath Urine  Result Value Ref Range Status   Specimen Description   Final    IN/OUT CATH URINE Performed at [facility] [address] [place], [place] [address]    Special Requests   Final    NONE Performed at  [facility], [place] [address] [place], [place] [address]    Culture MULTIPLE SPECIES PRESENT, SUGGEST RECOLLECTION (A)  Final   Report Status 12/03/2021 FINAL  Final  MRSA Next Gen by PCR, Nasal     Status: None   Collection Time: 12/02/21  1:04 PM   Specimen: Nasal Mucosa; Nasal Swab  Result Value Ref Range Status   MRSA by PCR Next Gen NOT DETECTED NOT DETECTED Final    Comment: (NOTE) The GeneXpert MRSA Assay (FDA approved for NASAL specimens only), is one component of a comprehensive MRSA colonization surveillance program. It is not intended to diagnose MRSA infection nor to guide or monitor treatment for MRSA infections. Test performance is not FDA approved in patients less than 26 years old. Performed at [facility], [place] [address], [place], [place] [address]   Culture, blood (Routine X 2) w Reflex to ID Panel     Status: None (Preliminary result)   Collection Time: 12/05/21 11:45  AM   Specimen: BLOOD  Result Value Ref Range Status   Specimen Description   Final    BLOOD SITE NOT SPECIFIED Performed at [facility] [address], [place], [place] [address]    Special Requests   Final    BOTTLES DRAWN AEROBIC AND ANAEROBIC Blood Culture adequate volume Performed at [facility] [address] [place], [place] [address]    Culture   Final    NO GROWTH 4 DAYS Performed at [facility], [place] [address] [place], [place] [address]    Report Status PENDING  Incomplete  Culture, blood (Routine X 2) w Reflex to ID Panel     Status: None (Preliminary result)   Collection Time: 12/05/21 11:45 AM   Specimen: BLOOD  Result Value Ref Range Status   Specimen Description   Final    BLOOD SITE NOT SPECIFIED Performed at [facility] [address], [place], [place] [address]    Special Requests   Final    BOTTLES DRAWN AEROBIC AND ANAEROBIC Blood Culture adequate volume Performed at [place] [address] [place], [place] [address]    Culture   Final    NO GROWTH 4 DAYS Performed at [facility], [place] [address], [place], [place] [address]    Report Status PENDING  Incomplete  Expectorated Sputum Assessment w Gram Stain, Rflx to Resp Cult     Status: None   Collection Time: 12/05/21  6:35 PM   Specimen: Sputum  Result Value Ref Range Status   Specimen Description   Final    SPU Performed at [place] [place] [address], [place], [place] [address]    Special Requests   Final    NONE Performed at [facility], [place] [address], [place], [place] [address]    Sputum evaluation THIS SPECIMEN IS ACCEPTABLE FOR SPUTUM CULTURE  Final   Report Status 12/05/2021 FINAL  Final  Culture, Respiratory w Gram Stain     Status: None   Collection Time: 12/05/21  6:35 PM   Specimen: Sputum  Result Value Ref Range Status   Specimen Description    Final    SPU Performed at [facility] [address] [place], [place] [address]    Special Requests   Final    NONE Reflexed from [id number] Performed at [facility], [place] [address] [place], [place] [address]  Gram Stain   Final    FEW WBC PRESENT, PREDOMINANTLY MONONUCLEAR FEW SQUAMOUS EPITHELIAL CELLS PRESENT FEW GRAM VARIABLE ROD FEW BUDDING YEAST SEEN    Culture   Final    FEW Normal respiratory flora-no Staph aureus or Pseudomonas seen Performed at [facility], [address] [place], [place] [address]    Report Status 12/08/2021 FINAL  Final  Culture, Respiratory w Gram Stain     Status: None   Collection Time: 12/06/21  6:15 PM   Specimen: Bronchoalveolar Lavage; Respiratory  Result Value Ref Range Status   Specimen Description   Final    BRONCHIAL ALVEOLAR LAVAGE Performed at [facility] [address] [place], [place] [address]    Special Requests   Final    NONE Performed at [facility], [place] [address], [place], [place] [address]    Gram Stain   Final    FEW WBC PRESENT, PREDOMINANTLY MONONUCLEAR NO ORGANISMS SEEN    Culture   Final    NO GROWTH 2 DAYS Performed at [facility] [address], [place], [place] [address]    Report Status 12/09/2021 FINAL  Final  Culture, Respiratory w Gram Stain     Status: None (Preliminary result)   Collection Time: 12/08/21 11:32 AM   Specimen: Bronchoalveolar Lavage; Respiratory  Result Value Ref Range Status   Specimen Description   Final    BRONCHIAL ALVEOLAR LAVAGE Performed at [place] [address], [place], [place] [address]    Special Requests   Final    NONE Performed at [facility], [place] [address] [place], [place] [address]    Gram Stain NO WBC SEEN NO ORGANISMS SEEN   Final   Culture   Final    NO GROWTH < 24 HOURS Performed at [facility], [place]  [address], [place], [place] [address]    Report Status PENDING  Incomplete  Pneumocystis smear by DFA     Status: None   Collection Time: 12/08/21 11:32 AM   Specimen: Bronchoalveolar Lavage; Respiratory  Result Value Ref Range Status   Specimen Source-[facility] BRONCHIAL ALVEOLAR LAVAGE  Final   Pneumocystis jiroveci Ag NEGATIVE  Final    Comment: Performed at [place] Performed at [place] [address] [place], [place] [address]     Impression/Plan:  1. Multifocal pneumonia - this is associated with fever and neutropenia.  Differential is broad for her and BAL sent and no positive findings to date. She remains on broad spectrum antibiotics including vancomycin, meropenem and micafungin and no changes at this time.  Will continue to monitor the cultures.    2.  Immunosuppression - WBC remains low with significant neutropenia due to the MDS, not likely to recover and G-CSF contraindicated so not much left to do for this.    3.  Prolonged Qtc - elevated after starting voriconazole and now this has been stopped.  Back on micafungin and no fungal pathogen noted so will continue off of voriconazole and other Qt prolonging antibiotics as much as possible.

## 2021-12-09 NOTE — Progress Notes (Signed)
Assessed on 5/5-- tidal volumes in mid-300s. She denies feeling SOB.  BP (!) 138/50   Pulse 74   Temp 100.2 F (37.9 C)   Resp (!) 27   Ht 5' (1.524 m)   Wt 56.3 kg   SpO2 98%   BMI 24.24 kg/m  Breathing comfortably, mild tachypnea CTAB Occasional PACs, sinus rhythm on tele  Plan to extubate to [place]. D/w RN & RT.  Julian Hy, DO 12/09/21 10:30 AM [facility]
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
## 2021-12-09 NOTE — Progress Notes (Signed)
Paperwork for work absence for daughter completed and returned to her at bedside.  Julian Hy, DO 12/09/21 11:45 AM [facility]

## 2021-12-09 NOTE — Progress Notes (Signed)
eLink Physician-Brief Progress Note Patient Name: WELTHA CATHY DOB: [birth year] MRN: [id number]   Date of Service  12/09/2021  HPI/Events of Note  Multiple isssues: 1. Blood glucose = 153. Patient NPO and tube feeds are not running. Patient currently on Levemir 26 units [place] Q 12 hours + Q 4 hour resistant Novolog SSI + Q 4 hour 9 units Novolog tube feed coverage. 2. SBP = 160's.  eICU Interventions  Plan: Decrease Levemir to 20 units [place] Q 12 hours. D/C Q 4 hours Novolog tube feeding coverage.  Change  to Q 4 hour moderate Novolog SSI. Hydralazine 10 mg IV Q 4 hours PRN SBP > 170 or DBP > 100.     Intervention Category Major Interventions: Hyperglycemia - active titration of insulin therapy;Hypertension - evaluation and management  Lysle Dingwall 12/09/2021, 8:28 PM

## 2021-12-09 NOTE — Progress Notes (Signed)
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 Was in [place] until about 2d PTA developed weakness, fatigue. Family saw her yesterday, confused, febrile, called EMS. She says she has some cough but not expectorating anything. Doesn't report any other infectious sx.   Had PLT transfusion 11/4. In ED febrile and found to have LLL/lingula pneumonia, given vanc/cefepime/azithro, 2.75L LR, started on LR infusion. Transfused 1U pRBC at 0700 11/9.  Also noted to have worsening hyponatremia na 122 (corrected 124-126). Lactic 8 ->2.2, worsening pancytopenia  She has been maintained on broad-spectrum antibiotics and has been followed by infectious disease.  PCCM called back on 11/13 due to progressive respiratory failure with severe hypoxia and tachypnea. Intubated, central access placed, vasopressors added.   Pertinent  Medical History  HTN Anemia DM2 Breast CA MDS  Significant Hospital Events: Including procedures, antibiotic start and stop dates in addition to other pertinent events   11/9 admitted, broad spectrum antibiotics 11/12 escalated to meropenem 11/13 intubated, bronch for BAL for cultures 11/15 bronch for assessment of DAH, repeat cultures  Interim History / Subjective:  Tmax 101.1 yesterday, afebrile overnight.  1 units of platelets overnight.   Objective   Blood pressure (!) 133/44, pulse 73, temperature 100 F (37.8 C), resp. rate (!) 30, height 5' (1.524 m), weight 56.3 kg, SpO2 100 %. CVP:  [8 mmHg-14 mmHg] 14 mmHg  Vent Mode:  PRVC FiO2 (%):  [30 %-40 %] 30 % Set Rate:  [30 bmp] 30 bmp Vt Set:  [360 mL] 360 mL PEEP:  [8 cmH20-10 cmH20] 8 cmH20 Plateau Pressure:  [23 cmH20-28 cmH20] 23 cmH20   Intake/Output Summary (Last 24 hours) at 12/09/2021 0918 Last data filed at 12/09/2021 0854 Gross per 24 hour  Intake 3687.69 ml  Output 1100 ml  Net 2587.69 ml    Filed Weights   12/07/21 0354 12/08/21 0500 12/09/21 0414  Weight: 54.2 kg 55.9 kg 56.3 kg    Examination: General: critically ill appearing woman lying in bed in NAD HEENT: [place]/AT, eyes anicteric, endotracheal tube in place Neuro: RASS -4, strong cough, gagging on the tube.  Apneic between episodes of coughing on SBT.  Moving all extremities.  Nodding to answer questions asked by her daughter.  Able to lift her head off the bed. CV: S1-S2, tachycardic, regular rhythm PULM: Rhonchi and rales, minimal secretions.  Strong cough. GI: Soft, nontender Extremities: No peripheral edema, no clubbing or cyanosis Skin: Warm, dry, no rashes   K+ 5.7 BUN 60 Cr 0.46 WBC 1.4 H/H  7.3/22.8 Platelets 9  Cultures reviewed> all NGTD PJP smear negative Resp culture 11/15> no WBC Resp culture 11/13 NGTD Resp culture 11/12 normal flora Blood cultures NGTD Aspergillus Ag pending  Qtc 41m  Resolved Hospital Problem list   Hyponatremia Hypokalemia Hypophosphatemia  Assessment & Plan:   ARDS Acute hypercarbic & hypoxic respiratory failure Multilobar pneumonia vs DAH in immunocompromised host -Low tidal volume ventilation, 4 to 8 cc/kg ideal body weight with goal plateau less than 30 and driving pressure less than 15-meeting goals - VAP prevention  protocol - PAD protocol for sedation-weaning sedation today - SAT and SBT-hopefully extubating today. -Antibiotics per ID. Follow cultures.  Septic shock due to pneumonia, presumed aspiration pneumonia -Appreciate ID's management - Continue to follow cultures - Antibiotics per ID - Vasopressors as  required to maintain MAP greater than 65.  Due to A-fib has done better with vasopressin or Neo-Synephrine.  Uncontrolled hyperglycemia -Transition off insulin drip to basal bolus insulin - Goal blood glucose 140-180 - Tube feeds temporarily stopped during SAT and due to gagging-watch blood sugars.  Discussed with RN.  Paroxysmal atrial fibrillation likely 2/2 acute illness Polymorphic VT, prolonged Qtc -Remain off amiodarone due to previous episodes of torsade the point and worsened QTc prolongation while on amiodarone - Continue telemetry monitoring - Continue vasopressin, phenylephrine.  Hopefully will not need these when she will require sedation - Monitor electrolytes and replete as needed - Please avoid all QTc prolonging medications. - With borderline low ejection fraction, prefer to avoid calcium channel blockers for rate control. - Anticoagulation for A-fib contraindicated with such severe thrombocytopenia.  Hypotension History of hypertension -Continue holding home antihypertensives  Pancytopenia MDS, previous history of breast cancer -Appreciate Dr. Ernestina Penna evaluation, rec's for holding GCSF due to high risk transformation to AML.  Overall prognosis is guarded, but family is hopeful and would like to give her more time.  We will generally see how she does over the next week. -Transfuse for platelets less than 20 per oncology - Transfuse hemoglobin less than 7 or hemodynamically significant bleeding. - Monitor for signs of bleeding-high risk, especially steroids.  Hypokalemia, resolved> now hyperkalemic -Lokelma  Hypophosphatemia -Monitor  At risk for malnutrition  -Continue tube feeds-held for a few hours this morning to facilitate SAT.  Will be resumed if she remains intubated.  Best Practice (right click and "Reselect all SmartList Selections" daily)  Diet/type: tubefeeds DVT prophylaxis: SCD GI prophylaxis: PPI-high-dose while on steroids with such severe platelet  deficiency. Lines: Central line Foley:  Yes, and it is still needed Code Status:  full code Last date of multidisciplinary goals of care discussion: 11/13 per Dr. Carlis Abbott. Daughter, [name], updated on plan of care 11/15  Critical Care Time:    This patient is critically ill with multiple organ system failure which requires frequent high complexity decision making, assessment, support, evaluation, and titration of therapies. This was completed through the application of advanced monitoring technologies and extensive interpretation of multiple databases. During this encounter critical care time was devoted to patient care services described in this note for 39 minutes.  Julian Hy, DO 12/09/21 10:01 AM [facility]

## 2021-12-09 NOTE — Procedures (Signed)
Extubation Procedure Note  Patient Details:   Name: Amanda Duarte DOB: 06-Oct-1955 MRN: [id number]   Airway Documentation:    Vent end date: 12/09/21 Vent end time: 1046   Evaluation  O2 sats: stable throughout Complications: No apparent complications Patient did tolerate procedure well. Bilateral Breath Sounds: Clear, Diminished   Yes  Johnette Abraham 12/09/2021, 10:47 AM

## 2021-12-09 NOTE — Progress Notes (Signed)
Endo tool target CBG met, eLink nurse notified. Continue IV insulin per eLink MD until CBG is more consistent.

## 2021-12-09 NOTE — Progress Notes (Signed)
Pharmacy Antibiotic Note  Amanda Duarte is a 65 y.o. female admitted on 12/01/2021 with fatigue, SOB, weakness, and breast cancer on chemotherapy.  Pharmacy has been consulted for Vancomycin dosing in the setting of febrile neutropenia.   Vancomycin Peak: 20 ug/mL, subtherapeutic  Vancomycin Random: 4 ug/mL She is currently on Vancomycin '750mg'$  IV q24 hours. Her Scr is 0.56 today.   Two-level kinetic calculations were used to formulate a new dose recommendation. Will plan to increase dose to archive a new eAUC of 448 - goal: AUC 400-550   Plan: Increase Vancomycin dose to '1000mg'$  q12 hours Continue Meropenem and Micafungin  Monitor renal function  Monitor for opportunities to de-escalate as able   Height: 5' (152.4 cm) Weight: 56.3 kg (124 lb 1.9 oz) IBW/kg (Calculated) : 45.5  Temp (24hrs), Avg:99.5 F (37.5 C), Min:98.2 F (36.8 C), Max:101.1 F (38.4 C)  Recent Labs  Lab 12/06/21 0249 12/07/21 0245 12/07/21 1112 12/08/21 0509 12/09/21 0154 12/09/21 0413 12/09/21 1200  WBC 1.3* 1.6*  --  1.2*  --  1.4* 1.4*  CREATININE 0.50 0.87 0.81 0.62  --  0.46 0.56  VANCOPEAK  --   --   --   --  20*  --   --   VANCORANDOM  --   --   --   --   --   --  4    Estimated Creatinine Clearance: 54.4 mL/min (by C-G formula based on SCr of 0.56 mg/dL).    Allergies  Allergen Reactions   Victoza [Liraglutide] Nausea And Vomiting    Antimicrobials this admission: 11/8 azithro x1 11/8 cefepime>>11/12 11/9 vanc>>11/11, resume 11/12 >> 11/9 Flagyl>>11/12 11/12 Meropenem >> 11/13 Micafungin x 1, 11/15 >> 11/14 Voriconazole >> 11/15  Dose adjustments this admission: N/A  Microbiology results: All cultures negative.   Thank you for allowing pharmacy to be a part of this patient's care.  Vicenta Dunning, PharmD  PGY1 Pharmacy Resident

## 2021-12-10 ENCOUNTER — Inpatient Hospital Stay: Payer: Medicare Other | Admitting: Hematology

## 2021-12-10 ENCOUNTER — Inpatient Hospital Stay: Payer: Medicare Other

## 2021-12-10 DIAGNOSIS — J80 Acute respiratory distress syndrome: Secondary | ICD-10-CM | POA: Diagnosis not present

## 2021-12-10 DIAGNOSIS — D61818 Other pancytopenia: Secondary | ICD-10-CM | POA: Diagnosis not present

## 2021-12-10 DIAGNOSIS — J181 Lobar pneumonia, unspecified organism: Secondary | ICD-10-CM | POA: Diagnosis not present

## 2021-12-10 LAB — BASIC METABOLIC PANEL
Anion gap: 8 (ref 5–15)
BUN: 42 mg/dL — ABNORMAL HIGH (ref 8–23)
CO2: 27 mmol/L (ref 22–32)
Calcium: 8.7 mg/dL — ABNORMAL LOW (ref 8.9–10.3)
Chloride: 116 mmol/L — ABNORMAL HIGH (ref 98–111)
Creatinine, Ser: 0.32 mg/dL — ABNORMAL LOW (ref 0.44–1.00)
GFR, Estimated: >60 mL/min (ref >60)
Glucose, Bld: 175 mg/dL — ABNORMAL HIGH (ref 70–99)
Potassium: 3.4 mmol/L — ABNORMAL LOW (ref 3.5–5.1)
Sodium: 151 mmol/L — ABNORMAL HIGH (ref 135–145)

## 2021-12-10 LAB — CBC
HCT: 23.4 % — ABNORMAL LOW (ref 36.0–46.0)
Hemoglobin: 7.6 g/dL — ABNORMAL LOW (ref 12.0–15.0)
MCH: 30 pg (ref 26.0–34.0)
MCHC: 32.5 g/dL (ref 30.0–36.0)
MCV: 92.5 fL (ref 80.0–100.0)
Platelets: 11 10*3/uL — CL (ref 150–400)
RBC: 2.53 MIL/uL — ABNORMAL LOW (ref 3.87–5.11)
RDW: 16.1 % — ABNORMAL HIGH (ref 11.5–15.5)
WBC: 1.3 10*3/uL — CL (ref 4.0–10.5)
nRBC: 0 % (ref 0.0–0.2)

## 2021-12-10 LAB — GLUCOSE, CAPILLARY
Glucose-Capillary: 161 mg/dL — ABNORMAL HIGH (ref 70–99)
Glucose-Capillary: 138 mg/dL — ABNORMAL HIGH (ref 70–99)
Glucose-Capillary: 124 mg/dL — ABNORMAL HIGH (ref 70–99)
Glucose-Capillary: 403 mg/dL — ABNORMAL HIGH (ref 70–99)
Glucose-Capillary: 390 mg/dL — ABNORMAL HIGH (ref 70–99)

## 2021-12-10 LAB — CULTURE, RESPIRATORY W GRAM STAIN
Culture: NO GROWTH
Gram Stain: NONE SEEN

## 2021-12-10 LAB — BPAM PLATELET PHERESIS
Blood Product Expiration Date: 202311182359
ISSUE DATE / TIME: 202311160613
Unit Type and Rh: 7300

## 2021-12-10 LAB — MAGNESIUM: Magnesium: 2.1 mg/dL (ref 1.7–2.4)

## 2021-12-10 LAB — PREPARE PLATELET PHERESIS: Unit division: 0

## 2021-12-10 LAB — PHOSPHORUS: Phosphorus: 3.5 mg/dL (ref 2.5–4.6)

## 2021-12-10 LAB — CULTURE, BLOOD (ROUTINE X 2)
Culture: NO GROWTH
Culture: NO GROWTH
Special Requests: ADEQUATE
Special Requests: ADEQUATE

## 2021-12-10 MED ORDER — POTASSIUM CHLORIDE 10 MEQ/50ML IV SOLN
10.0000 meq | INTRAVENOUS | Status: AC
  Administered 2021-12-10 (×4): 10 meq via INTRAVENOUS
  Filled 2021-12-10 (×4): qty 50

## 2021-12-10 MED ORDER — DEXTROSE 5 % IV SOLN: INTRAVENOUS | Status: DC

## 2021-12-10 MED ORDER — ORAL CARE MOUTH RINSE: 15.0000 mL | OROMUCOSAL | Status: DC | PRN

## 2021-12-10 MED ORDER — INSULIN ASPART 100 UNIT/ML IJ SOLN
0.0000 [IU] | Freq: Every day | INTRAMUSCULAR | Status: DC
  Administered 2021-12-10 – 2021-12-11 (×2): 5 [IU] via SUBCUTANEOUS
  Administered 2021-12-12: 4 [IU] via SUBCUTANEOUS
  Administered 2021-12-13: 3 [IU] via SUBCUTANEOUS
  Administered 2021-12-14: 4 [IU] via SUBCUTANEOUS
  Administered 2021-12-15 – 2021-12-16 (×2): 3 [IU] via SUBCUTANEOUS

## 2021-12-10 MED ORDER — INSULIN ASPART 100 UNIT/ML IJ SOLN
0.0000 [IU] | Freq: Three times a day (TID) | INTRAMUSCULAR | Status: DC
  Administered 2021-12-11: 11 [IU] via SUBCUTANEOUS
  Administered 2021-12-11: 7 [IU] via SUBCUTANEOUS
  Administered 2021-12-11: 20 [IU] via SUBCUTANEOUS
  Administered 2021-12-12 (×2): 15 [IU] via SUBCUTANEOUS
  Administered 2021-12-12: 4 [IU] via SUBCUTANEOUS
  Administered 2021-12-13 (×2): 11 [IU] via SUBCUTANEOUS
  Administered 2021-12-14 (×2): 4 [IU] via SUBCUTANEOUS
  Administered 2021-12-15: 20 [IU] via SUBCUTANEOUS
  Administered 2021-12-15: 4 [IU] via SUBCUTANEOUS
  Administered 2021-12-16: 2 [IU] via SUBCUTANEOUS

## 2021-12-10 MED ORDER — METHYLPREDNISOLONE SODIUM SUCC 125 MG IJ SOLR
60.0000 mg | Freq: Every day | INTRAMUSCULAR | Status: DC
  Administered 2021-12-11: 60 mg via INTRAVENOUS
  Filled 2021-12-10: qty 2

## 2021-12-10 MED ORDER — FREE WATER: 200.0000 mL | Status: DC

## 2021-12-10 MED ORDER — SODIUM CHLORIDE 0.9% IV SOLUTION: Freq: Once | INTRAVENOUS | Status: AC

## 2021-12-10 NOTE — Hospital Course (Addendum)
Amanda Duarte was admitted to the hospital with the working diagnosis of left lower lobe pneumonia, complicated with acute hypoxemic respiratory failure.   35 yof w/ left breast cancer (Dx 04/2017 S/P left mastectomy and adjuvant radiation), HTN HLD T2DM and recent diagnosis of high-grade myeloid Neoplasm/myelodysplastic syndrome (Bone marrow biopsy 10/2 with confirmatory biopsy 10/11 revealing 7% blasts) thought to be caused by history of chemotherapy complicated by pancytopenia   Despite aggressive treatment with broad-spectrum intravenous antibiotics in the days that followed patient clinically worsened with recurrent fevers and worsening shortness of breath with progressively worsening acute hypoxic respiratory failure eventually prompting PCCM involvement and broadening of antibiotic coverage under the direction of infectious disease.    Hospital course was complicated by persisting pancytopenia requiring intermittent transfusions of blood products including platelets (1 pack on 11/4, 1 pack 11/16 and 1 pack 11/17),  and packed red blood cells (11/9).   Dr. Annamaria Boots with oncology has been following.  Current targets are transfusions for platelet count of less than 15 and hemoglobin of less than 7.   Unfortunately due to progressive respiratory failure patient eventually required intubation on 11/13 for progressive respiratory failure and transfer to the intensive care unit.  A bronchoscopy was performed at that time with BAL performed for cultures.  Repeat bronchoscopy was performed on 11/15 revealing evidence pulmonary hemorrhage, thought to be secondary to recurrent severe thrombocytopenia.  Hospital course was further complicated by the development of septic shock requiring intermittent vasopressors.   Patient was managed with Neo-Synephrine which has since been weaned off.  Patient also developed episodes of paroxysmal atrial fibrillation.  Amiodarone was initially given but later this was discontinued on  11/14 after having episodes of polymorphic ventricular tachycardia and prolonged QTc.    Patient has since been weaned off of vasopressors.  Patient has been extubated on 11/16.  Patient is currently weaned to 5 L of oxygen via nasal cannula.  11/18: Transferred back to [facility] service.  Worked with PT 11/17 advised home health PT with RW 11/18> left IJ removed and RUE PICC line placed 11/20: ID discontinued Vancomycin ( MRSA PCR is negative, BAL cx and resp cx negative, blood cx negative) 12/15/21:Meropenem/micafungin stopped, plan to monitor off antibiotics.

## 2021-12-10 NOTE — Progress Notes (Signed)
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 Was in [place] until about 2d PTA developed weakness, fatigue. Family saw her yesterday, confused, febrile, called EMS. She says she has some cough but not expectorating anything. Doesn't report any other infectious sx.   Had PLT transfusion 11/4. In ED febrile and found to have LLL/lingula pneumonia, given vanc/cefepime/azithro, 2.75L LR, started on LR infusion. Transfused 1U pRBC at 0700 11/9.  Also noted to have worsening hyponatremia na 122 (corrected 124-126). Lactic 8 ->2.2, worsening pancytopenia  She has been maintained on broad-spectrum antibiotics and has been followed by infectious disease.  PCCM called back on 11/13 due to progressive respiratory failure with severe hypoxia and tachypnea. Intubated, central access placed, vasopressors added.   Pertinent  Medical History  HTN Anemia DM2 Breast CA MDS  Significant Hospital Events: Including procedures, antibiotic start and stop dates in addition to other pertinent events   11/9 admitted, broad spectrum antibiotics 11/12 escalated to meropenem 11/13 intubated, bronch for BAL for cultures 11/15 bronch for assessment of DAH, repeat cultures  Interim History / Subjective:  Extubated  2U PLTs total over last 24h  Discussed progress to date and code status with pt and her daughter Amanda Duarte with video interpreter   Objective   Blood pressure (!) 164/62, pulse 68, temperature 97.7 F (36.5 C), resp. rate (!) 30, height 5' (1.524 m), weight 55.5  kg, SpO2 97 %. CVP:  [7 mmHg-11 mmHg] 7 mmHg      Intake/Output Summary (Last 24 hours) at 12/10/2021 1131 Last data filed at 12/10/2021 1043 Gross per 24 hour  Intake 1722.83 ml  Output 1460 ml  Net 262.83 ml   Filed Weights   12/08/21 0500 12/09/21 0414 12/10/21 0400  Weight: 55.9 kg 56.3 kg 55.5 kg    Examination: General: critically ill appearing woman lying in bed in NAD HEENT: [place]/AT, eyes anicteric, endotracheal tube in place Neuro: RASS -4, strong cough, gagging on the tube.  Apneic between episodes of coughing on SBT.  Moving all extremities.  Nodding to answer questions asked by her daughter.  Able to lift her head off the bed. CV: S1-S2, tachycardic, regular rhythm PULM: Rhonchi and rales, minimal secretions.  Strong cough. GI: Soft, nontender Extremities: No peripheral edema, no clubbing or cyanosis Skin: Warm, dry, no rashes  Na 151 BUN/S Cr trending down WBC 1.3 Hb 7.6 PLT 11   Infx workup unrevealing to date  No new imaging  Resolved Hospital Problem list   Shock - unclear if septic or if SIRS related response to aspiration Hyponatremia Hypokalemia Hypophosphatemia  Assessment & Plan:   ARDS Acute hypercarbic & hypoxic respiratory failure Multilobar pneumonia vs DAH in immunocompromised host Good chance this may represent bland hemorrhage from TCP, alternatively infectious issue but infx workup to date unrevealing - ABX per ID - decrease to solumedrol 60 mg daily to treat bland alveolar hemorrhage  DM2 with hyperglycemia - basal bolus insulin  Paroxysmal atrial fibrillation likely 2/2 acute illness Polymorphic VT, prolonged Qtc - Remain off amiodarone due to previous episodes  of TdP and worsened QTc prolongation while on amiodarone - tele - correct electrolytes - avoid all QTc prolonging medications. - borderline low ejection fraction, prefer to avoid calcium channel blockers for rate control. - AC for A-fib contraindicated with such severe  thrombocytopenia.  Hypotension History of hypertension - holding home antihypertensives  Pancytopenia MDS, previous history of breast cancer - Appreciate Dr. Ernestina Penna evaluation, rec's for holding GCSF due to high risk transformation to AML.  Overall prognosis is guarded, but family is hopeful and would like to give her more time.  We will generally see how she does over the next week. - Palliative consulted today - Transfuse for platelets less than 20 per oncology - Transfuse hemoglobin less than 7 or hemodynamically significant bleeding. - Monitor for signs of bleeding-high risk, especially steroids.  Hypophosphatemia - Monitor  At risk for malnutrition  - speech eval today  Best Practice (right click and "Reselect all SmartList Selections" daily)  Diet/type: tubefeeds - speech eval today DVT prophylaxis: SCD GI prophylaxis: PPI-high-dose while on steroids with such severe platelet deficiency. Lines: Central line Foley:  Yes, and it is still needed Code Status:  full code Last date of multidisciplinary goals of care discussion: 11/17 with video interpreter, pt, daughter Amanda Duarte. Discussed poor prognosis for her MDS per Dr. Ernestina Penna last note and her high risk of developing respiratory failure again due to pneumonia (with profoundly weak immune system) or due to bland hemorrhage due to TCP. Though she participates in discussion she is not 100% attentive and I would not say she was decisional at time of our discussion. Family would like her involvement in decision-making about code status and perhaps she'll be more participatory later on today. Amanda Duarte is concerned however that her mom doesn't fully understand how serious her MDS is and how limited her path forward may be.   Critical Care Time:      [name] 12/10/21 11:31 AM [facility]

## 2021-12-10 NOTE — Evaluation (Signed)
Physical Therapy Evaluation Patient Details Name: Amanda Duarte MRN: [id number] DOB: 02-06-1955 Today's Date: 12/10/2021  History of Present Illness  Pt is a 66 year old woman admitted on 11/9 with fever, weakness, hyperglycemia and AMS. 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 Pt with increased hypoxia on 11/13, intubated 11/13- 11/16 with multilobar PNA. PMH: breast cancer s/p L mastectomy/chemo/radiation, HTN, HLD, anemia, DM2.  Clinical Impression  Pt admitted as above and presenting with functional mobility limitations 2* generalized weakness, balance deficits and limited endurance.  Pt motivated to regain IND and should progress to dc home with 24/7 assist of family.  Pt would benefit from follow up HHPT to maximize IND and safey and from use of RW for home.     Recommendations for follow up therapy are one component of a multi-disciplinary discharge planning process, led by the attending physician.  Recommendations may be updated based on patient status, additional functional criteria and insurance authorization.  Follow Up Recommendations Home health PT      Assistance Recommended at Discharge Frequent or constant Supervision/Assistance  Patient can return home with the following  A lot of help with walking and/or transfers;A little help with bathing/dressing/bathroom;Assistance with cooking/housework;Assist for transportation;Help with stairs or ramp for entrance    Equipment Recommendations Rolling walker (2 wheels) (youth)  Recommendations for Other Services       Functional Status Assessment Patient has had a recent decline in their functional status and demonstrates the ability to make significant improvements in function in a reasonable and predictable amount of time.     Precautions / Restrictions Precautions Precautions: Fall Restrictions Weight Bearing Restrictions: No      Mobility  Bed Mobility Overal bed mobility: Needs Assistance Bed Mobility: Supine to Sit,  Sit to Supine     Supine to sit: Mod assist Sit to supine: Mod assist   General bed mobility comments: assist for hips to EOB, to guide LEs and raise trunk to sit EOB, guided trunk and assisted LEs back into bed    Transfers Overall transfer level: Needs assistance Equipment used: 2 person hand held assist Transfers: Sit to/from Stand Sit to Stand: +2 physical assistance, Min assist           General transfer comment: assist to rise and steady    Ambulation/Gait Ambulation/Gait assistance: Mod assist, +2 physical assistance, +2 safety/equipment Gait Distance (Feet): 3 Feet Assistive device: 2 person hand held assist Gait Pattern/deviations: Step-to pattern, Decreased step length - left, Decreased step length - right, Shuffle, Trunk flexed Gait velocity: decr     General Gait Details: Pt side stepped up side of bed only  Stairs            Wheelchair Mobility    Modified Rankin (Stroke Patients Only)       Balance Overall balance assessment: Needs assistance Sitting-balance support: Feet unsupported, Single extremity supported Sitting balance-Leahy Scale: Fair     Standing balance support: Bilateral upper extremity supported Standing balance-Leahy Scale: Poor                               Pertinent Vitals/Pain Pain Assessment Pain Assessment: No/denies pain Pain Location: right arm - appears edematous - elevated on pillow    Home Living Family/patient expects to be discharged to:: Private residence Living Arrangements: Children;Other (Comment) Available Help at Discharge: Family;Available 24 hours/day Type of Home: House Home Access: Stairs to enter   [employer] of  Steps: 1   Home Layout: Two level;Able to live on main level with bedroom/bathroom Home Equipment: None      Prior Function Prior Level of Function : Independent/Modified Independent                     Hand Dominance   Dominant Hand: Right     Extremity/Trunk Assessment   Upper Extremity Assessment Upper Extremity Assessment: Defer to OT evaluation;Generalized weakness    Lower Extremity Assessment Lower Extremity Assessment: Generalized weakness    Cervical / Trunk Assessment Cervical / Trunk Assessment: Normal  Communication   Communication: Prefers language other than English  Cognition Arousal/Alertness: Awake/alert Behavior During Therapy: WFL for tasks assessed/performed Overall Cognitive Status: Within Functional Limits for tasks assessed                                 General Comments: WFL for tasks assessed, daughter interpreted, pt appears to have poor insight into the gravity of her illness and need for care when she returns home        General Comments      Exercises     Assessment/Plan    PT Assessment Patient needs continued PT services  PT Problem List Decreased strength;Decreased activity tolerance;Decreased balance;Decreased mobility;Decreased knowledge of use of DME       PT Treatment Interventions DME instruction;Gait training;Stair training;Functional mobility training;Therapeutic activities;Therapeutic exercise;Balance training;Patient/family education    PT Goals (Current goals can be found in the Care Plan section)  Acute Rehab PT Goals Patient Stated Goal: regain IND PT Goal Formulation: With patient Time For Goal Achievement: 12/23/21 Potential to Achieve Goals: Good    Frequency Min 3X/week     Co-evaluation PT/OT/SLP Co-Evaluation/Treatment: Yes Reason for Co-Treatment: For patient/therapist safety;To address functional/ADL transfers PT goals addressed during session: Mobility/safety with mobility OT goals addressed during session: ADL's and self-care       AM-PAC PT "6 Clicks" Mobility  Outcome Measure Help needed turning from your back to your side while in a flat bed without using bedrails?: A Lot Help needed moving from lying on your back to sitting  on the side of a flat bed without using bedrails?: A Lot Help needed moving to and from a bed to a chair (including a wheelchair)?: A Lot Help needed standing up from a chair using your arms (e.g., wheelchair or bedside chair)?: A Lot Help needed to walk in hospital room?: A Lot Help needed climbing 3-5 steps with a railing? : Total 6 Click Score: 11    End of Session Equipment Utilized During Treatment: Gait belt;Oxygen Activity Tolerance: Patient tolerated treatment well Patient left: in bed;with call bell/phone within reach;with bed alarm set;with family/visitor present Nurse Communication: Mobility status PT Visit Diagnosis: Unsteadiness on feet (R26.81);Muscle weakness (generalized) (M62.81);Difficulty in walking, not elsewhere classified (R26.2)    Time: 4196-2229 PT Time Calculation (min) (ACUTE ONLY): 24 min   Charges:   PT Evaluation $PT Eval Moderate Complexity: [place] Pager [phone] Office [phone]   Lyfe Monger 12/10/2021, 5:14 PM
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
## 2021-12-10 NOTE — TOC Progression Note (Signed)
Transition of Care [facility]) - Progression Note    Patient Details  Name: Amanda Duarte MRN: [id number] Date of Birth: [birth year]  Transition of Care [facility]) CM/SW Contact  Henrietta Dine, RN Phone Number: 12/10/2021, 10:54 AM  Clinical Narrative:    Not ready for d/c; pt remains on HFNC; TOC will con't to follow.   Expected Discharge Plan: Home/Self Care Barriers to Discharge: Continued Medical Work up  Expected Discharge Plan and Services Expected Discharge Plan: Home/Self Care                                               Social Determinants of Health (SDOH) Interventions    Readmission Risk Interventions    12/02/2021    2:10 PM  Readmission Risk Prevention Plan  Transportation Screening Complete  PCP or Specialist Appt within 5-7 Days Complete  Home Care Screening Complete  Medication Review (RN CM) Complete

## 2021-12-10 NOTE — Progress Notes (Signed)
    [facility]   Reason for visit: Follow up on multifocal pneumonia  Interval History: afebrile > 24 hours, remains neutropenia  Physical Exam: Constitutional:  Vitals:   12/10/21 1100 12/10/21 1200  BP: (!) 176/66 (!) 167/64  Pulse: 69 68  Resp: (!) 32 (!) 32  Temp: 98.2 F (36.8 C) 98.4 F (36.9 C)  SpO2: 98% 99%    No new findings on cultures, she remains on broad antibiotic coverage.   No changes.  Will follow remotely over the weekend unless there are changes and follow up again on Monday.   Thayer Headings, MD

## 2021-12-10 NOTE — Evaluation (Signed)
Occupational Therapy Evaluation Patient Details Name: Amanda Duarte MRN: [id number] DOB: [birth year] Today's Date: 12/10/2021   History of Present Illness Pt is a 66 year old woman admitted on 11/9 with fever, weakness, hyperglycemia and AMS. 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 Pt with increased hypoxia on 11/13, intubated 11/13- 11/16 with multilobar PNA. PMH: breast cancer s/p L mastectomy/chemo/radiation, HTN, HLD, anemia, DM2.   Clinical Impression   Pt was independent prior to admission. Presents with generalized weakness, impaired standing balance and decreased activity tolerance. She currently requires moderate assistance for bed mobility, +2 min assist to stand and +2 mod assist to side step along EOB. Pt needs set up to max assist for ADLs. VSS on 5L O2 throughout session. Daughter in room and interpreted, plans for pt to go to her home where there is a bedroom and bathroom on the first floor. Recommend HHOT and shower seat.      Recommendations for follow up therapy are one component of a multi-disciplinary discharge planning process, led by the attending physician.  Recommendations may be updated based on patient status, additional functional criteria and insurance authorization.   Follow Up Recommendations  Home health OT     Assistance Recommended at Discharge Frequent or constant Supervision/Assistance  Patient can return home with the following A lot of help with walking and/or transfers;A lot of help with bathing/dressing/bathroom;Assistance with cooking/housework;Direct supervision/assist for financial management;Assist for transportation;Help with stairs or ramp for entrance;Direct supervision/assist for medications management    Functional Status Assessment  Patient has had a recent decline in their functional status and demonstrates the ability to make significant improvements in function in a reasonable and predictable amount of time.  Equipment Recommendations   Tub/shower seat    Recommendations for Other Services       Precautions / Restrictions Precautions Precautions: Fall      Mobility Bed Mobility Overal bed mobility: Needs Assistance Bed Mobility: Supine to Sit, Sit to Supine     Supine to sit: Mod assist Sit to supine: Mod assist   General bed mobility comments: assist for hips to EOB, to guide LEs and raise trunk to sit EOB, guided trunk and assisted LEs back into bed    Transfers Overall transfer level: Needs assistance Equipment used: 2 person hand held assist Transfers: Sit to/from Stand Sit to Stand: +2 physical assistance, Min assist           General transfer comment: assist to rise and steady, +2 mod assist for side steps along EOB with B hand held assist      Balance Overall balance assessment: Needs assistance   Sitting balance-Leahy Scale: Fair       Standing balance-Leahy Scale: Poor                             ADL either performed or assessed with clinical judgement   ADL Overall ADL's : Needs assistance/impaired Eating/Feeding: Set up;Bed level   Grooming: Min guard;Sitting   Upper Body Bathing: Minimal assistance;Sitting   Lower Body Bathing: Maximal assistance;Sit to/from stand   Upper Body Dressing : Minimal assistance;Sitting   Lower Body Dressing: Maximal assistance;Sit to/from stand   Toilet Transfer: +2 for physical assistance;Moderate assistance Toilet Transfer Details (indicate cue type and reason): simulated Toileting- Clothing Manipulation and Hygiene: Total assistance;Sit to/from stand       Functional mobility during ADLs: Moderate assistance;+2 for physical assistance (side steps along EOB)  Vision Baseline Vision/History: 0 No visual deficits       Perception     Praxis      Pertinent Vitals/Pain Pain Assessment Pain Assessment: No/denies pain     Hand Dominance Right   Extremity/Trunk Assessment Upper Extremity Assessment Upper  Extremity Assessment: Generalized weakness;Overall [facility] for tasks assessed   Lower Extremity Assessment Lower Extremity Assessment: Defer to PT evaluation   Cervical / Trunk Assessment Cervical / Trunk Assessment: Normal   Communication Communication Communication: Prefers language other than English (Spanish)   Cognition Arousal/Alertness: Awake/alert Behavior During Therapy: WFL for tasks assessed/performed Overall Cognitive Status: Within Functional Limits for tasks assessed                                 General Comments: WFL for tasks assessed, daughter interpreted, pt appears to have poor insight into the gravity of her illness and need for care when she returns home     General Comments       Exercises     Shoulder Instructions      Home Living Family/patient expects to be discharged to:: Private residence Living Arrangements: Children;Other (Comment) (daughter prefer she go to her home) Available Help at Discharge: Family;Available 24 hours/day Type of Home: House Home Access: Stairs to enter [employer] of Steps: 1   Home Layout: Two level;Able to live on main level with bedroom/bathroom     Bathroom Shower/Tub: Teacher, early years/pre: Standard     Home Equipment: None          Prior Functioning/Environment Prior Level of Function : Independent/Modified Independent                        OT Problem List: Decreased strength;Decreased activity tolerance;Impaired balance (sitting and/or standing);Decreased knowledge of use of DME or AE      OT Treatment/Interventions: Self-care/ADL training;DME and/or AE instruction;Energy conservation;Therapeutic activities;Patient/family education;Balance training    OT Goals(Current goals can be found in the care plan section) Acute Rehab OT Goals OT Goal Formulation: With patient/family Time For Goal Achievement: 12/24/21 Potential to Achieve Goals: Good ADL Goals Pt  Will Perform Grooming: with min assist;standing Pt Will Perform Lower Body Bathing: with min assist;sit to/from stand Pt Will Perform Lower Body Dressing: with min assist;sit to/from stand Pt Will Transfer to Toilet: with min assist;ambulating;regular height toilet Pt Will Perform Toileting - Clothing Manipulation and hygiene: with min assist;sit to/from stand Pt Will Perform Tub/Shower Transfer: with min assist;Tub transfer;shower seat;rolling walker Additional ADL Goal #1: Pt will initiate rest breaks with fatigue.  OT Frequency: Min 2X/week    Co-evaluation PT/OT/SLP Co-Evaluation/Treatment: Yes Reason for Co-Treatment: For patient/therapist safety   OT goals addressed during session: ADL's and self-care      AM-PAC OT "6 Clicks" Daily Activity     Outcome Measure Help from another person eating meals?: A Little Help from another person taking care of personal grooming?: A Little Help from another person toileting, which includes using toliet, bedpan, or urinal?: Total Help from another person bathing (including washing, rinsing, drying)?: A Lot Help from another person to put on and taking off regular upper body clothing?: A Little Help from another person to put on and taking off regular lower body clothing?: A Lot 6 Click Score: 14   End of Session Equipment Utilized During Treatment: Oxygen (5L) Nurse Communication: Mobility status;Other (comment) (ok to give thickened  apple juice)  Activity Tolerance: Patient tolerated treatment well Patient left: in bed;with call bell/phone within reach;with family/visitor present  OT Visit Diagnosis: Unsteadiness on feet (R26.81);Other abnormalities of gait and mobility (R26.89);Muscle weakness (generalized) (M62.81)                Time: 2353-6144 OT Time Calculation (min): 22 min Charges:  OT General Charges $OT Visit: 1 Visit OT Evaluation $OT Eval Moderate Complexity: [place], OTR/L Acute Rehabilitation Services Office:  [phone]   Malka So 12/10/2021, 3:42 PM

## 2021-12-10 NOTE — Progress Notes (Signed)
Amanda Duarte   DOB:22-Oct-1955   HE#:[id number]   NTI#:[id number]  Hem/onc follow up   Subjective: Patient was extubated this morning. She is aware, alert, able to sit in bed and plan to eat dinner. Per her nurse, she still has watery stool, no significant bleeding.    Objective:  Vitals:   12/10/21 1600 12/10/21 1700  BP: (!) 165/76 (!) 166/96  Pulse: 73   Resp: (!) 24 (!) 30  Temp: 98.1 F (36.7 C) 98.1 F (36.7 C)  SpO2: 100%     Body mass index is 23.9 kg/m.  Intake/Output Summary (Last 24 hours) at 12/10/2021 1729 Last data filed at 12/10/2021 1600 Gross per 24 hour  Intake 1581.98 ml  Output 2110 ml  Net -528.02 ml     Sclerae unicteric  Abdomen soft   Yellow urine in bag      CBG (last 3)  Recent Labs    12/10/21 0831 12/10/21 1154 12/10/21 1704  GLUCAP 138* 124* 403*     Labs:  Urine Studies No results for input(s): "UHGB", "CRYS" in the last 72 hours.  Invalid input(s): "UACOL", "UAPR", "USPG", "UPH", "UTP", "UGL", "UKET", "UBIL", "UNIT", "UROB", "ULEU", "UEPI", "UWBC", "URBC", "UBAC", "CAST", "UCOM", "BILUA"  Basic Metabolic Panel: Recent Labs  Lab 12/04/21 0254 12/05/21 0254 12/07/21 0245 12/07/21 1112 12/08/21 0509 12/09/21 0413 12/09/21 1200 12/10/21 0440  NA 132*   < > 135 138 141 145 146* 151*  K 2.8*   < > 4.3 3.8 3.5 5.7* 4.5 3.4*  CL 102   < > 102 105 110 115* 114* 116*  CO2 24   < > 18* _0 GLUCOSE 57*   < > 193* 324* 363* 185* 224* 175*  BUN 12   < > 28* 42* 56* 60* 59* 42*  CREATININE 0.52   < > 0.87 0.81 0.62 0.46 0.56 0.32*  CALCIUM 8.3*   < > 8.1* 7.7* 8.2* 8.5* 8.8* 8.7*  MG 1.7  --  2.0 2.0 2.7* 2.7*  --  2.1  PHOS 2.4*  --  6.0*  --  2.3* 3.9  --  3.5   < > = values in this interval not displayed.   GFR Estimated Creatinine Clearance: 54.1 mL/min (A) (by C-G formula based on SCr of 0.32 mg/dL (L)). Liver Function Tests: Recent Labs  Lab 12/04/21 0254  AST 16  ALT 9  ALKPHOS 39  BILITOT 0.8  PROT 6.3*   ALBUMIN 2.1*   No results for input(s): "LIPASE", "AMYLASE" in the last 168 hours. No results for input(s): "AMMONIA" in the last 168 hours. Coagulation profile No results for input(s): "INR", "PROTIME" in the last 168 hours.   CBC: Recent Labs  Lab 12/04/21 0254 12/05/21 0254 12/06/21 0249 12/07/21 0245 12/08/21 0509 12/09/21 0413 12/09/21 1200 12/10/21 0440  WBC 2.1* 1.5* 1.3* 1.6* 1.2* 1.4* 1.4* 1.3*  NEUTROABS 0.6* 0.4* 0.4* 0.5*  --   --  0.4*  --   HGB 8.4* 8.1* 7.0* 8.7* 8.0* 7.3* 7.7* 7.6*  HCT 24.8* 22.9* 20.1* 26.8* 23.6* 22.8* 23.8* 23.4*  MCV 88.9 86.4 87.4 93.4 90.4 94.2 94.4 92.5  PLT 25* 15* 7* 34* 10* 9* 25* 11*   Cardiac Enzymes: No results for input(s): "CKTOTAL", "CKMB", "CKMBINDEX", "TROPONINI" in the last 168 hours. BNP: Invalid input(s): "POCBNP" CBG: Recent Labs  Lab 12/09/21 2350 12/10/21 0434 12/10/21 0831 12/10/21 1154 12/10/21 1704  GLUCAP 190* 161* 138* 124* 403*   D-Dimer No results  for input(s): "DDIMER" in the last 72 hours. Hgb A1c No results for input(s): "HGBA1C" in the last 72 hours. Lipid Profile No results for input(s): "CHOL", "HDL", "LDLCALC", "TRIG", "CHOLHDL", "LDLDIRECT" in the last 72 hours. Thyroid function studies No results for input(s): "TSH", "T4TOTAL", "T3FREE", "THYROIDAB" in the last 72 hours.  Invalid input(s): "FREET3" Anemia work up No results for input(s): "VITAMINB12", "FOLATE", "FERRITIN", "TIBC", "IRON", "RETICCTPCT" in the last 72 hours. Microbiology Recent Results (from the past 240 hour(s))  Blood Culture (routine x 2)     Status: None   Collection Time: 12/01/21 11:13 PM   Specimen: BLOOD  Result Value Ref Range Status   Specimen Description   Final    BLOOD RIGHT ANTECUBITAL Performed at [facility] [address], [place], [place] [address]    Special Requests   Final    BOTTLES DRAWN AEROBIC AND ANAEROBIC Blood Culture results may not be optimal due to an excessive  volume of blood received in culture bottles Performed at [facility] [address], [place], [place] [address]    Culture   Final    NO GROWTH 5 DAYS Performed at [facility], [place] [address], [place], [place] [address]    Report Status 12/07/2021 FINAL  Final  Blood Culture (routine x 2)     Status: None   Collection Time: 12/01/21 11:13 PM   Specimen: BLOOD  Result Value Ref Range Status   Specimen Description   Final    BLOOD SITE NOT SPECIFIED Performed at [facility] [address], [place], [place] [address]    Special Requests   Final    NONE Performed at [facility], [place] [address] [place], [place] [address]    Culture   Final    NO GROWTH 5 DAYS Performed at [facility], [place] [address], [place], [place] [address]    Report Status 12/07/2021 FINAL  Final  Resp Panel by RT-PCR (Flu A&B, Covid) Anterior Nasal Swab     Status: None   Collection Time: 12/01/21 11:25 PM   Specimen: Anterior Nasal Swab  Result Value Ref Range Status   SARS Coronavirus 2 by RT PCR NEGATIVE NEGATIVE Final    Comment: (NOTE) SARS-CoV-2 target nucleic acids are NOT DETECTED.  The SARS-CoV-2 RNA is generally detectable in upper respiratory specimens during the acute phase of infection. The lowest concentration of SARS-CoV-2 viral copies this assay can detect is 138 copies/mL. A negative result does not preclude SARS-Cov-2 infection and should not be used as the sole basis for treatment or other patient management decisions. A negative result may occur with  improper specimen collection/handling, submission of specimen other than nasopharyngeal swab, presence of viral mutation(s) within the areas targeted by this assay, and inadequate number of viral copies(<138 copies/mL). A negative result must be combined with clinical observations, patient history, and epidemiological information. The expected result is  Negative.  Fact Sheet for Patients:  EntrepreneurPulse.com.au  Fact Sheet for Healthcare Providers:  IncredibleEmployment.be  This test is no t yet approved or cleared by the [place] FDA and  has been authorized for detection and/or diagnosis of SARS-CoV-2 by FDA under an Emergency Use Authorization (EUA). This EUA will remain  in effect (meaning this test can be used) for the duration of the COVID-19 declaration under Section 564(b)(1) of the Act, 21 U.S.C.section 360bbb-3(b)(1), unless the authorization is terminated  or revoked sooner.  Influenza A by PCR NEGATIVE NEGATIVE Final   Influenza B by PCR NEGATIVE NEGATIVE Final    Comment: (NOTE) The Xpert Xpress SARS-CoV-2/FLU/RSV plus assay is intended as an aid in the diagnosis of influenza from Nasopharyngeal swab specimens and should not be used as a sole basis for treatment. Nasal washings and aspirates are unacceptable for Xpert Xpress SARS-CoV-2/FLU/RSV testing.  Fact Sheet for Patients: EntrepreneurPulse.com.au  Fact Sheet for Healthcare Providers: IncredibleEmployment.be  This test is not yet approved or cleared by the [place] FDA and has been authorized for detection and/or diagnosis of SARS-CoV-2 by FDA under an Emergency Use Authorization (EUA). This EUA will remain in effect (meaning this test can be used) for the duration of the COVID-19 declaration under Section 564(b)(1) of the Act, 21 U.S.C. section 360bbb-3(b)(1), unless the authorization is terminated or revoked.  Performed at [facility], [place] [address] [place], [place] [address]   Respiratory (~20 pathogens) panel by PCR     Status: None   Collection Time: 12/01/21 11:25 PM   Specimen: Nasopharyngeal Swab; Respiratory  Result Value Ref Range Status   Adenovirus NOT DETECTED NOT DETECTED Final   Coronavirus 229E NOT DETECTED NOT DETECTED Final     Comment: (NOTE) The Coronavirus on the Respiratory Panel, DOES NOT test for the novel  Coronavirus (2019 nCoV)    Coronavirus HKU1 NOT DETECTED NOT DETECTED Final   Coronavirus NL63 NOT DETECTED NOT DETECTED Final   Coronavirus OC43 NOT DETECTED NOT DETECTED Final   Metapneumovirus NOT DETECTED NOT DETECTED Final   Rhinovirus / Enterovirus NOT DETECTED NOT DETECTED Final   Influenza A NOT DETECTED NOT DETECTED Final   Influenza B NOT DETECTED NOT DETECTED Final   Parainfluenza Virus 1 NOT DETECTED NOT DETECTED Final   Parainfluenza Virus 2 NOT DETECTED NOT DETECTED Final   Parainfluenza Virus 3 NOT DETECTED NOT DETECTED Final   Parainfluenza Virus 4 NOT DETECTED NOT DETECTED Final   Respiratory Syncytial Virus NOT DETECTED NOT DETECTED Final   Bordetella pertussis NOT DETECTED NOT DETECTED Final   Bordetella Parapertussis NOT DETECTED NOT DETECTED Final   Chlamydophila pneumoniae NOT DETECTED NOT DETECTED Final   Mycoplasma pneumoniae NOT DETECTED NOT DETECTED Final    Comment: Performed at [facility], [place]. [address] [place], [place] [address]  Urine Culture     Status: Abnormal   Collection Time: 12/02/21  3:00 AM   Specimen: In/Out Cath Urine  Result Value Ref Range Status   Specimen Description   Final    IN/OUT CATH URINE Performed at [facility] [address] [place], [place] [address]    Special Requests   Final    NONE Performed at [facility], [place] [address] [place], [place] [address]    Culture MULTIPLE SPECIES PRESENT, SUGGEST RECOLLECTION (A)  Final   Report Status 12/03/2021 FINAL  Final  MRSA Next Gen by PCR, Nasal     Status: None   Collection Time: 12/02/21  1:04 PM   Specimen: Nasal Mucosa; Nasal Swab  Result Value Ref Range Status   MRSA by PCR Next Gen NOT DETECTED NOT DETECTED Final    Comment: (NOTE) The GeneXpert MRSA Assay (FDA approved for NASAL specimens only), is one component of a  comprehensive MRSA colonization surveillance program. It is not intended to diagnose MRSA infection nor to guide or monitor treatment for MRSA infections. Test performance is not FDA approved in patients less than 20 years old. Performed  at [facility], [place] [address] [place], [place] [address]   Culture, blood (Routine X 2) w Reflex to ID Panel     Status: None   Collection Time: 12/05/21 11:45 AM   Specimen: BLOOD  Result Value Ref Range Status   Specimen Description   Final    BLOOD SITE NOT SPECIFIED Performed at [facility] [address] [place], [place] [address]    Special Requests   Final    BOTTLES DRAWN AEROBIC AND ANAEROBIC Blood Culture adequate volume Performed at [facility] [address], [place], [place] [address]    Culture   Final    NO GROWTH 5 DAYS Performed at [facility], [place] [address], [place], [place] [address]    Report Status 12/10/2021 FINAL  Final  Culture, blood (Routine X 2) w Reflex to ID Panel     Status: None   Collection Time: 12/05/21 11:45 AM   Specimen: BLOOD  Result Value Ref Range Status   Specimen Description   Final    BLOOD SITE NOT SPECIFIED Performed at [facility] [address] [place], [place] [address]    Special Requests   Final    BOTTLES DRAWN AEROBIC AND ANAEROBIC Blood Culture adequate volume Performed at [facility] [address], [place], [place] [address]    Culture   Final    NO GROWTH 5 DAYS Performed at [facility], [place] [address], [place], [place] [address]    Report Status 12/10/2021 FINAL  Final  Expectorated Sputum Assessment w Gram Stain, Rflx to Resp Cult     Status: None   Collection Time: 12/05/21  6:35 PM   Specimen: Sputum  Result Value Ref Range Status   Specimen Description   Final    SPU Performed at [facility] [address] [place], [place] [address]     Special Requests   Final    NONE Performed at [facility], [place] [address] [place], [place] [address]    Sputum evaluation THIS SPECIMEN IS ACCEPTABLE FOR SPUTUM CULTURE  Final   Report Status 12/05/2021 FINAL  Final  Culture, Respiratory w Gram Stain     Status: None   Collection Time: 12/05/21  6:35 PM   Specimen: Sputum  Result Value Ref Range Status   Specimen Description   Final    SPU Performed at [facility] [address], [place], [place] [address]    Special Requests   Final    NONE Reflexed from [id number] Performed at [facility], [place] [address], [place], [place] [address]    Gram Stain   Final    FEW WBC PRESENT, PREDOMINANTLY MONONUCLEAR FEW SQUAMOUS EPITHELIAL CELLS PRESENT FEW GRAM VARIABLE ROD FEW BUDDING YEAST SEEN    Culture   Final    FEW Normal respiratory flora-no Staph aureus or Pseudomonas seen Performed at [facility], [address], [place], [place] [address]    Report Status 12/08/2021 FINAL  Final  Culture, Respiratory w Gram Stain     Status: None   Collection Time: 12/06/21  6:15 PM   Specimen: Bronchoalveolar Lavage; Respiratory  Result Value Ref Range Status   Specimen Description   Final    BRONCHIAL ALVEOLAR LAVAGE Performed at [facility] [address], [place], [place] [address]    Special Requests   Final  NONE Performed at [facility], [place] [address] [place], [place] [address]    Gram Stain   Final    FEW WBC PRESENT, PREDOMINANTLY MONONUCLEAR NO ORGANISMS SEEN    Culture   Final    NO GROWTH 2 DAYS Performed at [facility] [address] [place], [place] [address]    Report Status 12/09/2021 FINAL  Final  Acid Fast Smear (AFB)     Status: None   Collection Time: 12/06/21  6:15 PM   Specimen: Bronchoalveolar Lavage; Respiratory  Result Value Ref Range Status   AFB Specimen Processing Concentration  Final    Acid Fast Smear Negative  Final    Comment: (NOTE) Performed At: [facility] [place], [place] [id number] Rush Farmer MD DH:[id number]    Source (AFB) BRONCHIAL ALVEOLAR LAVAGE  Final    Comment: Performed at [facility] [address] [place], [place] [address]  Culture, Respiratory w Gram Stain     Status: None   Collection Time: 12/08/21 11:32 AM   Specimen: Bronchoalveolar Lavage; Respiratory  Result Value Ref Range Status   Specimen Description   Final    BRONCHIAL ALVEOLAR LAVAGE Performed at [facility] [address] [place], [place] [address]    Special Requests   Final    NONE Performed at [facility], [place] [address] [place], [place] [address]    Gram Stain NO WBC SEEN NO ORGANISMS SEEN   Final   Culture   Final    NO GROWTH 2 DAYS Performed at [facility], [place] [address], [place], [place] [address]    Report Status 12/10/2021 FINAL  Final  Pneumocystis smear by DFA     Status: None   Collection Time: 12/08/21 11:32 AM   Specimen: Bronchoalveolar Lavage; Respiratory  Result Value Ref Range Status   Specimen Source-[facility] BRONCHIAL ALVEOLAR LAVAGE  Final   Pneumocystis jiroveci Ag NEGATIVE  Final    Comment: Performed at [place] Performed at [facility] [address], [place], [place] [address]       Studies:  No results found.  Assessment: 66 y.o. female   Sepsis and septic shock from left lower lobe pneumonia and aspiration pneumonia Hypoxic respiratory failure secondary to pneumonia Neutropenic fever Pancytopenia, secondary to MDS and chemo Hyponatremia Type 2 diabetes and hyperglycemia History of breast cancer HTN    Plan:  -Patient has made significant progress, extubated, off pressor, hemodynamically stable. -Labs reviewed, she remains to be pancytopenic, platelet counts 11, she received 1 unit platelet   -code status has been discussed with pt and her daughter, she remains full code now. 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  However given her relatively young age, in the early phase of her MDS treatment, I think is reasonable to be full code, unless patient and family wants DNR. I spoke with Dr. Verlee Monte.  -infection and supportive care per ID and ICU team, I appreciate their excellent care  -continue blood transfusion to keep Hg>7 and plt>15K.  For prophylaxis platelet transfusion, 1 unit in 24 hours should be adequate. -I will f/u early next week   [name], MD 12/10/2021

## 2021-12-10 NOTE — Progress Notes (Signed)
eLink Physician-Brief Progress Note Patient Name: Amanda Duarte DOB: [birth year] MRN: [id number]   Date of Service  12/10/2021  HPI/Events of Note  Thrombocytopenia - Platelet count = 11 and [name] wants Platelet count > 15.  eICU Interventions  Plan: Transfuse 1 unit single donor platelets.     Intervention Category Major Interventions: Other:  Lysle Dingwall 12/10/2021, 5:40 AM

## 2021-12-10 NOTE — Progress Notes (Signed)
[facility] ADULT ICU REPLACEMENT PROTOCOL   The patient does apply for the [facility] Adult ICU Electrolyte Replacment Protocol based on the criteria listed below:   1.Exclusion criteria: TCTS, ECMO, Dialysis, and Myasthenia Gravis patients 2. Is GFR >/= 30 ml/min? Yes.    Patient's GFR today is >60 3. Is SCr </= 2? Yes.   Patient's SCr is 0.32 mg/dL 4. Did SCr increase >/= 0.5 in 24 hours? No. 5.Pt's weight >40kg  Yes.   6. Abnormal electrolyte(s): K+ 3.4  7. Electrolytes replaced per protocol 8.  Call MD STAT for K+ </= 2.5, Phos </= 1, or Mag </= 1 Physician:  Ed Blalock [facility] 12/10/2021 5:41 AM

## 2021-12-11 ENCOUNTER — Inpatient Hospital Stay: Payer: Self-pay

## 2021-12-11 DIAGNOSIS — R0489 Hemorrhage from other sites in respiratory passages: Secondary | ICD-10-CM | POA: Insufficient documentation

## 2021-12-11 DIAGNOSIS — Z7189 Other specified counseling: Secondary | ICD-10-CM

## 2021-12-11 DIAGNOSIS — R531 Weakness: Secondary | ICD-10-CM | POA: Diagnosis not present

## 2021-12-11 DIAGNOSIS — Z515 Encounter for palliative care: Secondary | ICD-10-CM | POA: Diagnosis not present

## 2021-12-11 DIAGNOSIS — J80 Acute respiratory distress syndrome: Secondary | ICD-10-CM | POA: Diagnosis not present

## 2021-12-11 DIAGNOSIS — D61818 Other pancytopenia: Secondary | ICD-10-CM | POA: Diagnosis not present

## 2021-12-11 LAB — BASIC METABOLIC PANEL WITH GFR
Anion gap: 6 (ref 5–15)
BUN: 30 mg/dL — ABNORMAL HIGH (ref 8–23)
CO2: 29 mmol/L (ref 22–32)
Calcium: 8.6 mg/dL — ABNORMAL LOW (ref 8.9–10.3)
Chloride: 110 mmol/L (ref 98–111)
Creatinine, Ser: 0.4 mg/dL — ABNORMAL LOW (ref 0.44–1.00)
GFR, Estimated: >60 mL/min
Glucose, Bld: 354 mg/dL — ABNORMAL HIGH (ref 70–99)
Potassium: 3.6 mmol/L (ref 3.5–5.1)
Sodium: 145 mmol/L (ref 135–145)

## 2021-12-11 LAB — CBC
HCT: 27.9 % — ABNORMAL LOW (ref 36.0–46.0)
Hemoglobin: 9.1 g/dL — ABNORMAL LOW (ref 12.0–15.0)
MCH: 30.3 pg (ref 26.0–34.0)
MCHC: 32.6 g/dL (ref 30.0–36.0)
MCV: 93 fL (ref 80.0–100.0)
Platelets: 22 K/uL — CL (ref 150–400)
RBC: 3 MIL/uL — ABNORMAL LOW (ref 3.87–5.11)
RDW: 15.9 % — ABNORMAL HIGH (ref 11.5–15.5)
WBC: 1.6 K/uL — ABNORMAL LOW (ref 4.0–10.5)
nRBC: 0 % (ref 0.0–0.2)

## 2021-12-11 LAB — ASPERGILLUS ANTIGEN, BAL/SERUM: Aspergillus Ag, BAL/Serum: 0.07 Index (ref 0.00–0.49)

## 2021-12-11 LAB — VANCOMYCIN, TROUGH: Vancomycin Tr: 27 ug/mL (ref 15–20)

## 2021-12-11 LAB — VANCOMYCIN, PEAK: Vancomycin Pk: 33 ug/mL (ref 30–40)

## 2021-12-11 LAB — GLUCOSE, CAPILLARY
Glucose-Capillary: 277 mg/dL — ABNORMAL HIGH (ref 70–99)
Glucose-Capillary: 248 mg/dL — ABNORMAL HIGH (ref 70–99)
Glucose-Capillary: 357 mg/dL — ABNORMAL HIGH (ref 70–99)
Glucose-Capillary: 392 mg/dL — ABNORMAL HIGH (ref 70–99)

## 2021-12-11 MED ORDER — PREDNISONE 20 MG PO TABS
60.0000 mg | ORAL_TABLET | Freq: Every day | ORAL | Status: DC
  Administered 2021-12-12 – 2021-12-13 (×2): 60 mg via ORAL
  Filled 2021-12-11 (×2): qty 3

## 2021-12-11 MED ORDER — SULFAMETHOXAZOLE-TRIMETHOPRIM 800-160 MG PO TABS
1.0000 | ORAL_TABLET | ORAL | Status: DC
  Administered 2021-12-13 – 2021-12-22 (×5): 1 via ORAL
  Filled 2021-12-11 (×5): qty 1

## 2021-12-11 MED ORDER — INSULIN ASPART 100 UNIT/ML IJ SOLN
4.0000 [IU] | Freq: Three times a day (TID) | INTRAMUSCULAR | Status: DC
  Administered 2021-12-14 – 2021-12-15 (×4): 4 [IU] via SUBCUTANEOUS

## 2021-12-11 MED ORDER — LOSARTAN POTASSIUM 25 MG PO TABS
25.0000 mg | ORAL_TABLET | Freq: Every day | ORAL | Status: DC
  Administered 2021-12-12 – 2021-12-14 (×3): 25 mg via ORAL
  Filled 2021-12-11 (×3): qty 1

## 2021-12-11 MED ORDER — SODIUM CHLORIDE 0.9% FLUSH
10.0000 mL | INTRAVENOUS | Status: DC | PRN
  Administered 2021-12-17: 10 mL

## 2021-12-11 MED ORDER — ATORVASTATIN CALCIUM 40 MG PO TABS
40.0000 mg | ORAL_TABLET | Freq: Every day | ORAL | Status: DC
  Administered 2021-12-12 – 2021-12-22 (×11): 40 mg via ORAL
  Filled 2021-12-11 (×11): qty 1

## 2021-12-11 MED ORDER — SODIUM CHLORIDE 0.9% FLUSH
10.0000 mL | Freq: Two times a day (BID) | INTRAVENOUS | Status: DC
  Administered 2021-12-11 – 2021-12-18 (×8): 10 mL
  Administered 2021-12-20: 20 mL
  Administered 2021-12-20 – 2021-12-22 (×4): 10 mL

## 2021-12-11 NOTE — Progress Notes (Signed)
[facility]J CVC removed per order, site cleaned with CHG guaze, covered with vaseline guaze and dry 4x4, occlusive tape applied.  No bleeding from site.

## 2021-12-11 NOTE — Progress Notes (Signed)
A consult was placed to the IV Therapist to d/c  the pt's Left IJ triple lumen central line, that was placed 12-04-21;  pt is limited to the right arm for all ivs, lab work;  right arm is noted to have multiple bruises, and a hematoma on the forearm that her daughter says is from a lab draw;  R arm was assessed with ultrasound as she would need peripheral access once the central line is removed;   pt has a cephalic vein in the right upper arm ; suggest a PICC line for this pt as she is still on 3 iv medications; a secure chat has been sent to the MD, and RN is aware.

## 2021-12-11 NOTE — Consult Note (Signed)
Consultation Note Date: 12/11/2021   Patient Name: Amanda Duarte  DOB: [birth year]  MRN: [id number]  Age / Sex: 66 y.o., female  PCP: [name], NP Referring Physician: Antonieta Pert, MD  Reason for Consultation: Establishing goals of care  HPI/Patient Profile: 66 y.o. female  admitted on 12/01/2021    Clinical Assessment and Goals of Care: 67 year old with hypertension dyslipidemia anemia anxiety depression diabetes breast cancer status post left mastectomy and adjuvant XRT, neoadjuvant therapy, MDS thought to be chemo related, mated with weakness fatigue pneumonia worsening hyponatremia worsening pancytopenia also required intubation and mechanical ventilation briefly during this hospitalization as well as vasopressors for progressive respiratory failure, currently extubated and transferred out of the ICU.  Oncology also following, SLP also following.  PT OT has recommended home-based physical therapy. Patient is awake alert resting in bed.  Palliative consult request received, discussed with patient, she wishes for her daughter [name] who was present at the bedside to translate on her behalf.  Introduced myself and palliative care as follows: Palliative medicine is specialized medical care for people living with serious illness. It focuses on providing relief from the symptoms and stress of a serious illness. The goal is to improve quality of life for both the patient and the family. Goals of care: Broad aims of medical therapy in relation to the patient's values and preferences. Our aim is to provide medical care aimed at enabling patients to achieve the goals that matter most to them, given the circumstances of their particular medical situation and their constraints.   CODE STATUS and goals of care discussions taken, discussed with patient and family-see below  NEXT OF KIN  Two daughters [name] and [name] are  primary caregivers.   SUMMARY OF RECOMMENDATIONS   Full code full scope for now Recommend palliative follow-up with my colleague Ms. Cousar, NP at [facility] for ongoing goals of care discussions.  Code Status/Advance Care Planning: Full code   Symptom Management:     Palliative Prophylaxis:  Frequent Pain Assessment  Additional Recommendations (Limitations, Scope, Preferences): Full Scope Treatment  Psycho-social/Spiritual:  Desire for further Chaplaincy support:yes Additional Recommendations: Caregiving  Support/Resources  Prognosis:  Unable to determine  Discharge Planning: Home with Palliative Services      Primary Diagnoses: Present on Admission:  Sepsis with acute organ dysfunction without septic shock ([place])  Essential hypertension  Hyponatremia  Pancytopenia (HCC)  HLD (hyperlipidemia)  Hypokalemia  MDS (myelodysplastic syndrome), 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 The following aspects are pertinent.  Past Medical History:  Diagnosis Date   Cancer ([place]) 03/2017   left breast cancer   Diabetes mellitus without complication ([place])    Hyperlipidemia    Hypertension    Malignant neoplasm of left breast ([place])    Stroke (cerebrum) (HCC)    Tibial plateau fracture, left    04-13-17 had ORIF   Social History   Socioeconomic History   Marital status: Married  Spouse name: Not on file   Number of children: Not on file   Years of education: Not on file   Highest education level: Not on file  Occupational History   Not on file  Tobacco Use   Smoking status: Never   Smokeless tobacco: Never  Vaping Use   Vaping Use: Never used  Substance and Sexual Activity   Alcohol use: No   Drug use: No   Sexual activity: Not Currently    Birth control/protection: None  Other Topics Concern   Not on file  Social History Narrative   Not on file   Social  Determinants of Health   Financial Resource Strain: Not on file  Food Insecurity: No Food Insecurity (12/07/2021)   Hunger Vital Sign    Worried About Running Out of Food in the Last Year: Never true    Ran Out of Food in the Last Year: Never true  Transportation Needs: No Transportation Needs (12/07/2021)   PRAPARE - Hydrologist (Medical): No    Lack of Transportation (Non-Medical): No  Physical Activity: Not on file  Stress: Not on file  Social Connections: Not on file   Family History  Problem Relation Age of Onset   Heart Problems Mother    Diabetes Son    Breast cancer Neg Hx    Colon cancer Neg Hx    Rectal cancer Neg Hx    Stomach cancer Neg Hx    Esophageal cancer Neg Hx    Scheduled Meds:  sodium chloride   Intravenous Once   sodium chloride   Intravenous Once   [START ON 12/12/2021] atorvastatin  40 mg Oral Daily   Chlorhexidine Gluconate Cloth  6 each Topical Daily   docusate  100 mg Per Tube BID   insulin aspart  0-20 Units Subcutaneous TID WC   insulin aspart  0-5 Units Subcutaneous QHS   insulin aspart  4 Units Subcutaneous TID WC   insulin detemir  20 Units Subcutaneous Q12H   [START ON 12/12/2021] losartan  25 mg Oral Daily   pantoprazole (PROTONIX) IV  40 mg Intravenous Q12H   polyethylene glycol  17 g Per Tube Daily   [START ON 12/12/2021] predniSONE  60 mg Oral Q breakfast   [START ON 12/13/2021] sulfamethoxazole-trimethoprim  1 tablet Oral Once per day on Mon Wed Fri   Thrombi-Pad  1 each Topical Once   Continuous Infusions:  sodium chloride Stopped (12/07/21 1409)   meropenem (MERREM) IV Stopped (12/11/21 2229)   micafungin (MYCAMINE) 100 mg in sodium chloride 0.9 % 100 mL IVPB Stopped (12/11/21 1052)   vancomycin Stopped (12/11/21 0411)   PRN Meds:.sodium chloride, acetaminophen **OR** acetaminophen, albuterol, guaiFENesin, hydrALAZINE, mouth rinse Medications Prior to Admission:  Prior to Admission medications    Medication Sig Start Date End Date Taking? Authorizing Provider  atorvastatin (LIPITOR) 40 MG tablet Take 1 tablet (40 mg total) by mouth daily. 10/05/21  Yes [name], NP  citalopram (CELEXA) 20 MG tablet Take 1 tablet (20 mg total) by mouth daily. For depression 10/05/21  Yes [name], NP  fenofibrate (TRICOR) 145 MG tablet Take 1 tablet (145 mg total) by mouth daily. 10/05/21  Yes [name], NP  glimepiride (AMARYL) 4 MG tablet Take 1 tablet (4 mg total) by mouth in the morning and at bedtime. 10/05/21  Yes [name], NP  hydrOXYzine (ATARAX) 10 MG tablet Take 1 tablet (10 mg total) by mouth 3 (  three) times daily as needed for anxiety. 10/05/21  Yes [name], NP  insulin glargine (LANTUS) 100 UNIT/ML Solostar Pen Inject 25 Units into the skin at bedtime. 10/26/21  Yes Eugenie Filler, MD  losartan (COZAAR) 50 MG tablet Take 1 tablet (50 mg total) by mouth daily. 10/05/21  Yes [name], NP  ondansetron (ZOFRAN) 8 MG tablet Take 1 tablet (8 mg total) by mouth every 8 (eight) hours as needed for nausea or vomiting. Patient taking differently: Take 8 mg by mouth as needed for nausea or vomiting. 11/10/21  Yes [name], MD  pantoprazole (PROTONIX) 40 MG tablet Take 1 tablet (40 mg total) by mouth daily at 6 (six) AM. 10/27/21  Yes Eugenie Filler, MD  potassium chloride SA (KLOR-CON M) 20 MEQ tablet Take 1 tablet (20 mEq total) by mouth daily. 10/05/21  Yes [name], NP  prochlorperazine (COMPAZINE) 10 MG tablet Take 1 tablet (10 mg total) by mouth every 6 (six) hours as needed for nausea or vomiting. 11/10/21  Yes [name], MD  sitaGLIPtin-metformin (JANUMET) 50-1000 MG tablet TAKE 1 TABLET BY MOUTH 2 TIMES DAILY 10/05/21  Yes [name], NP  Accu-Chek Softclix Lancets lancets Use to check blood sugar 3 times daily. E11.65 10/28/21   Argentina Donovan, PA-C  Blood Glucose Monitoring Suppl (ACCU-CHEK GUIDE) w/Device KIT use kit to check blood  glucose three times daily 02/15/21   [name], NP  Blood Pressure Monitor DEVI Please provide patient with insurance approved blood pressure monitor 01/14/19   [name], NP  glucose blood (ACCU-CHEK GUIDE) test strip Use to check blood sugar 3 times daily. E11.65 10/28/21   Argentina Donovan, PA-C  insulin lispro (HUMALOG) 100 UNIT/ML injection Inject 0.04 mLs (4 Units total) into the skin 3 (three) times daily before meals. Patient not taking: Reported on 12/03/2021 02/17/21   [name], NP  Insulin Pen Needle 31G X 5 MM MISC USE AS INSTRUCTED. INJECT INTO THE SKIN ONCE NIGHTLY. 02/15/21 02/15/22  [name], NP  Insulin Syringe-Needle U-100 30G X 5/16" 1 ML MISC Use as directed to inject into the skin 3 times daily. 02/17/21   [name], NP   Allergies  Allergen Reactions   Victoza [Liraglutide] Nausea And Vomiting   Review of Systems Wants to drink juice, denies pain.   Physical Exam Awake alert oriented resting in bed  Regular work of breathing Diminished breath sounds at bases Abdomen is not distended  Vital Signs: BP (!) 159/75 (BP Location: Right Arm)   Pulse 75   Temp 98.4 F (36.9 C) (Oral)   Resp (!) 25   Ht 5' (1.524 m)   Wt 55.5 kg   SpO2 98%   BMI 23.90 kg/m  Pain Scale: 0-10   Pain Score: 0-No pain   SpO2: SpO2: 98 % O2 Device:SpO2: 98 % O2 Flow Rate: .O2 Flow Rate (L/min): 3 L/min  IO: Intake/output summary:  Intake/Output Summary (Last 24 hours) at 12/11/2021 1332 Last data filed at 12/11/2021 1112 Gross per 24 hour  Intake 1833.5 ml  Output 3625 ml  Net -1791.5 ml    LBM: Last BM Date : 12/11/21 Baseline Weight: Weight: 52.7 kg Most recent weight: Weight: 55.5 kg     Palliative Assessment/Data:   PPS 50%  Time In:  12.30 Time Out:  1330 Time Total:  60  Greater than 50%  of this time was spent counseling and coordinating care  related to the above assessment and plan.  Signed by: Loistine Chance, MD   Please  contact Palliative Medicine Team phone at [phone] for questions and concerns.  For individual provider: See Shea Evans

## 2021-12-11 NOTE — Progress Notes (Signed)
PROGRESS NOTE ANNALI LYBRAND  FUX:[id number] DOB: 02/12/1955 DOA: 12/01/2021 PCP: [name], NP   Brief Narrative/Hospital Course: 66 yof w/ left breast cancer (Dx 04/2017 S/P left mastectomy and adjuvant radiation), HTN HLD T2DM and recent diagnosis of high-grade myeloid Neoplasm/myelodysplastic syndrome (Bone marrow biopsy 10/2 with confirmatory biopsy 10/11 revealing 7% blasts) thought to be caused by history of chemotherapy complicated by pancytopenia presented to [facility]/ fatigue initially found to have sepsis secondary to left lower lobe pneumonia and admitted on 12/02/21.  Despite aggressive treatment with broad-spectrum intravenous antibiotics in the days that followed patient clinically worsened with recurrent fevers and worsening shortness of breath with progressively worsening acute hypoxic respiratory failure eventually prompting PCCM involvement and broadening of antibiotic coverage under the direction of infectious disease.   Hospital course was complicated by persisting pancytopenia requiring intermittent transfusions of blood products including platelets (1 pack on 11/4, 1 pack 11/16 and 1 pack 11/17),  and packed red blood cells (11/9).  Dr. Annamaria Boots with oncology has been following.  Current targets are transfusions for platelet count of less than 15 and hemoglobin of less than 7.   Unfortunately due to progressive respiratory failure patient eventually required intubation on 11/13 for progressive respiratory failure and transfer to the intensive care unit.  A bronchoscopy was performed at that time with BAL performed for cultures.  Repeat bronchoscopy was performed on 11/15 revealing evidence pulmonary hemorrhage, thought to be secondary to recurrent severe thrombocytopenia.  Hospital course was further complicated by the development of septic shock requiring intermittent vasopressors.  Patient was managed with Neo-Synephrine which has since been weaned off.  Patient also developed episodes of  paroxysmal atrial fibrillation.  Amiodarone was initially given but later this was discontinued on 11/14 after having episodes of polymorphic ventricular tachycardia and prolonged QTc.   Patient has since been weaned off of vasopressors.  Patient has been extubated on 11/16.  Patient is currently weaned to 5 L of oxygen via nasal cannula.  11/18: Transferred back to [facility] service.  Worked with PT 11/17 advised home health PT with RW       Subjective: Seen and examined this morning daughter at the bedside 11/8-overnight BP 110-1 70s, respiration 18-25, on 5 L nasal cannula, Tmax 99 Blood sugar 277, WBC 1.6 hemoglobin 9.1 platelet 22 all increasing Patient resting comfortably has no complaint, Foley catheter and fms in place  Assessment and Plan:  ARDS Acute hypoxic and hypercapnic respiratory failure Multilobar pneumonia versus DAH immunocompromised setting Septic shock-Resolved: Initially in the ICU needing intubation, vasopressor supportCurrently hemodynamically stable, on supplemental oxygen at 5 L, no shortness of breath.  Blood culture 11/12> NGTD, BAL culture 11/15 NGTD. Continue current vancomycin, meropenem, micafungin as per infectious disease.ambulate PT OT encouragement, WEAN O2  PAF/polymorphic VT with prolonged Qtc:off amio. On monitor. Hypertension: Meds on hold.bp stable/on higher side resume slowly PTA on losartan 50 mg,. HLD on Lipitor at home> resume.  Pancytopenia MDS Previous breast cancer history: [name] following.  S/p 2 Units PRBC 11/9 and 66. S/p multiple platelet transfusions (on  11/17, 11/16, 12/15, 11/13 x 2 and 11/9).On Solu-Medrol> changed to 60 mg daily 11/18.  Continue to monitor CBC daily Recent Labs  Lab 12/09/21 1200 12/10/21 0440 12/11/21 0451  HGB 7.7* 7.6* 9.1*  HCT 23.8* 23.4* 27.9*  WBC 1.4* 1.3* 1.6*  PLT 25* 11* 22*   T2DM with uncontrolled hyperglycemia: at home on Amaryl 4 mg, Lantus 25 bedtime Janumet, Humalog 4 units Premeal .  Currently on 20 minutes Levemir twice daily, SSI 0-20 units, add 4 u premeal. Of note on steroid here. Recent Labs  Lab 12/10/21 0831 12/10/21 1154 12/10/21 1704 12/10/21 2251 12/11/21 0811  GLUCAP 138* 124* 403* 390* 277*     Hypophosphatemia Hypokalemia: Monitor and replete Recent Labs  Lab 12/07/21 0245 12/07/21 1112 12/08/21 0509 12/09/21 0413 12/09/21 1200 12/10/21 0440 12/11/21 0451  K 4.3 3.8 3.5 5.7* 4.5 3.4* 3.6  CALCIUM 8.1* 7.7* 8.2* 8.5* 8.8* 8.7* 8.6*  MG 2.0 2.0 2.7* 2.7*  --  2.1  --   PHOS 6.0*  --  2.3* 3.9  --  3.5  --    Goals of care currently full code.  It seems he has made significant progress, continue current supportive measures.  Per hematology reasonable to be full code unless patient family wants DNR, continue to monitor encourage palliative care discussion for goals of care.  Left IJ central line placed please remove and obtain PIV to minimize infection  DVT prophylaxis: SCDs Start: 12/02/21 1022 Code Status:   Code Status: Full Code Family Communication: plan of care discussed with patient and her daughter at bedside. Patient status is: Patient because of pancytopenia, respiratory failure Level of care: Progressive   Dispo: The patient is from: home            Anticipated disposition: TBD in  Mobility Assessment (last 72 hours)     Mobility Assessment     Row Name 12/11/21 0812 12/11/21 0500 12/10/21 1700 12/10/21 1500     Does patient have an order for bedrest or is patient medically unstable Yes- Bedfast (Level 1) - Complete Yes- Bedfast (Level 1) - Complete -- --    What is the highest level of mobility based on the progressive mobility assessment? Level 3 (Stands with assist) - Balance while standing  and cannot march in place Level 3 (Stands with assist) - Balance while standing  and cannot march in place Level 3 (Stands with assist) - Balance while standing  and cannot march in place Level 3 (Stands with assist) - Balance while  standing  and cannot march in place    Is the above level different from baseline mobility prior to current illness? Yes - Recommend PT order -- -- --              Objective: Vitals last 24 hrs: Vitals:   12/11/21 0127 12/11/21 0626 12/11/21 0800 12/11/21 0812  BP: 114/63 (!) 170/70    Pulse: (!) 110 67    Resp: 18 18 (!) 23 (!) 25  Temp: 99 F (37.2 C) 97.7 F (36.5 C)    TempSrc: Oral Oral    SpO2: 100% 97%    Weight:      Height:       Weight change:   Physical Examination: General exam: alert awake, older than stated age HEENT:Oral mucosa moist, Ear/Nose WNL grossly Respiratory system: bilaterally clear BS, no use of accessory muscle Cardiovascular system: S1 & S2 +, No JVD. Gastrointestinal system: Abdomen soft,NT,ND, BS+ Nervous System:Alert, awake, moving extremities. Extremities: LE edema neg,distal peripheral pulses palpable.  Skin: No rashes,no icterus. MSK: Normal muscle bulk,tone, power Left IJ central line in place  Medications reviewed:  Scheduled Meds:  sodium chloride   Intravenous Once   sodium chloride   Intravenous Once   Chlorhexidine Gluconate Cloth  6 each Topical Daily   docusate  100 mg Per Tube BID   feeding supplement (PROSource TF20)  60 mL  Per Tube BID   insulin aspart  0-20 Units Subcutaneous TID WC   insulin aspart  0-5 Units Subcutaneous QHS   insulin detemir  20 Units Subcutaneous Q12H   methylPREDNISolone (SOLU-MEDROL) injection  60 mg Intravenous Daily   pantoprazole (PROTONIX) IV  40 mg Intravenous Q12H   polyethylene glycol  17 g Per Tube Daily   Thrombi-Pad  1 each Topical Once   Continuous Infusions:  sodium chloride Stopped (12/07/21 1409)   feeding supplement (OSMOLITE 1.2 CAL) Stopped (12/09/21 1900)   meropenem (MERREM) IV Stopped (12/11/21 7510)   micafungin (MYCAMINE) 100 mg in sodium chloride 0.9 % 100 mL IVPB 100 mg (12/11/21 0952)   vancomycin Stopped (12/11/21 0411)      Diet Order             DIET DYS 3  Room service appropriate? Yes; Fluid consistency: Nectar Thick  Diet effective now                    Nutrition Problem: Moderate Malnutrition Etiology: chronic illness Signs/Symptoms: energy intake < or equal to 75% for > or equal to 1 month, mild fat depletion, mild muscle depletion Interventions: Tube feeding   Intake/Output Summary (Last 24 hours) at 12/11/2021 1008 Last data filed at 12/11/2021 0812 Gross per 24 hour  Intake 1589.15 ml  Output 3400 ml  Net -1810.85 ml   Net IO Since Admission: 9,553.38 mL [12/11/21 1008]  Wt Readings from Last 3 Encounters:  12/10/21 55.5 kg  11/23/21 51.5 kg  11/17/21 51.7 kg     Unresulted Labs (From admission, onward)     Start     Ordered   12/11/21 1430  Vancomycin, trough  Once-Timed,   TIMED       Question:  Specimen collection method  Answer:  Unit=Unit collect   12/10/21 1158   12/11/21 2585  Basic metabolic panel  Daily at 5am,   R     Question:  Specimen collection method  Answer:  Unit=Unit collect   12/10/21 1558   12/11/21 0500  CBC  Daily at 5am,   R     Question:  Specimen collection method  Answer:  Unit=Unit collect   12/10/21 1558   12/06/21 1822  Fungus Culture With Stain  ONCE - URGENT,   URGENT       Comments: RML    12/06/21 1821   12/06/21 1822  Acid Fast Culture with reflexed sensitivities  (AFB smear + Culture w reflexed sensitivities panel)  Once,   R       See Hyperspace for full Linked Orders Report.   12/06/21 1821   12/06/21 1336  Histoplasma antigen, urine  Once,   R        12/06/21 1335   12/05/21 0000  Type and screen  R       Comments: [place]    12/05/21 2206          Data Reviewed: I have personally reviewed following labs and imaging studies CBC: Recent Labs  Lab 12/05/21 0254 12/06/21 0249 12/07/21 0245 12/08/21 0509 12/09/21 0413 12/09/21 1200 12/10/21 0440 12/11/21 0451  WBC 1.5* 1.3* 1.6* 1.2* 1.4* 1.4* 1.3* 1.6*  NEUTROABS 0.4* 0.4* 0.5*  --    --  0.4*  --   --   HGB 8.1* 7.0* 8.7* 8.0* 7.3* 7.7* 7.6* 9.1*  HCT 22.9* 20.1* 26.8* 23.6* 22.8* 23.8* 23.4* 27.9*  MCV 86.4 87.4 93.4 90.4 94.2 94.4 92.5  93.0  PLT 15* 7* 34* 10* 9* 25* 11* 22*   Basic Metabolic Panel: Recent Labs  Lab 12/07/21 0245 12/07/21 1112 12/08/21 0509 12/09/21 0413 12/09/21 1200 12/10/21 0440 12/11/21 0451  NA 135 138 141 145 146* 151* 145  K 4.3 3.8 3.5 5.7* 4.5 3.4* 3.6  CL 102 105 110 115* 114* 116* 110  CO2 18* _0 GLUCOSE 193* 324* 363* 185* 224* 175* 354*  BUN 28* 42* 56* 60* 59* 42* 30*  CREATININE 0.87 0.81 0.62 0.46 0.56 0.32* 0.40*  CALCIUM 8.1* 7.7* 8.2* 8.5* 8.8* 8.7* 8.6*  MG 2.0 2.0 2.7* 2.7*  --  2.1  --   PHOS 6.0*  --  2.3* 3.9  --  3.5  --    Recent Results (from the past 240 hour(s))  Blood Culture (routine x 2)     Status: None   Collection Time: 12/01/21 11:13 PM   Specimen: BLOOD  Result Value Ref Range Status   Specimen Description   Final    BLOOD RIGHT ANTECUBITAL Performed at [facility], [place] [address] [place], [place] [address]    Special Requests   Final    BOTTLES DRAWN AEROBIC AND ANAEROBIC Blood Culture results may not be optimal due to an excessive volume of blood received in culture bottles Performed at [place] [address], [place], [place] [address]    Culture   Final    NO GROWTH 5 DAYS Performed at [facility], [place] [address] [place], [place] [address]    Report Status 12/07/2021 FINAL  Final  Blood Culture (routine x 2)     Status: None   Collection Time: 12/01/21 11:13 PM   Specimen: BLOOD  Result Value Ref Range Status   Specimen Description   Final    BLOOD SITE NOT SPECIFIED Performed at [place] [address] [place], [place] [address]    Special Requests   Final    NONE Performed at [facility], [place] [address], [place], [place] [address]    Culture   Final    NO GROWTH 5  DAYS Performed at [facility], [place] [address] [place], [place] [address]    Report Status 12/07/2021 FINAL  Final  Resp Panel by RT-PCR (Flu A&B, Covid) Anterior Nasal Swab     Status: None   Collection Time: 12/01/21 11:25 PM   Specimen: Anterior Nasal Swab  Result Value Ref Range Status   SARS Coronavirus 2 by RT PCR NEGATIVE NEGATIVE Final    Comment: (NOTE) SARS-CoV-2 target nucleic acids are NOT DETECTED.  The SARS-CoV-2 RNA is generally detectable in upper respiratory specimens during the acute phase of infection. The lowest concentration of SARS-CoV-2 viral copies this assay can detect is 138 copies/mL. A negative result does not preclude SARS-Cov-2 infection and should not be used as the sole basis for treatment or other patient management decisions. A negative result may occur with  improper specimen collection/handling, submission of specimen other than nasopharyngeal swab, presence of viral mutation(s) within the areas targeted by this assay, and inadequate number of viral copies(<138 copies/mL). A negative result must be combined with clinical observations, patient history, and epidemiological information. The expected result is Negative.  Fact Sheet for Patients:  EntrepreneurPulse.com.au  Fact Sheet for Healthcare Providers:  IncredibleEmployment.be  This test is no t yet approved or cleared by the [place] FDA and  has  been authorized for detection and/or diagnosis of SARS-CoV-2 by FDA under an Emergency Use Authorization (EUA). This EUA will remain  in effect (meaning this test can be used) for the duration of the COVID-19 declaration under Section 564(b)(1) of the Act, 21 U.S.C.section 360bbb-3(b)(1), unless the authorization is terminated  or revoked sooner.       Influenza A by PCR NEGATIVE NEGATIVE Final   Influenza B by PCR NEGATIVE NEGATIVE Final    Comment: (NOTE) The Xpert Xpress SARS-CoV-2/FLU/RSV  plus assay is intended as an aid in the diagnosis of influenza from Nasopharyngeal swab specimens and should not be used as a sole basis for treatment. Nasal washings and aspirates are unacceptable for Xpert Xpress SARS-CoV-2/FLU/RSV testing.  Fact Sheet for Patients: EntrepreneurPulse.com.au  Fact Sheet for Healthcare Providers: IncredibleEmployment.be  This test is not yet approved or cleared by the [place] FDA and has been authorized for detection and/or diagnosis of SARS-CoV-2 by FDA under an Emergency Use Authorization (EUA). This EUA will remain in effect (meaning this test can be used) for the duration of the COVID-19 declaration under Section 564(b)(1) of the Act, 21 U.S.C. section 360bbb-3(b)(1), unless the authorization is terminated or revoked.  Performed at [facility], [place] [address] [place], [place] [address]   Respiratory (~20 pathogens) panel by PCR     Status: None   Collection Time: 12/01/21 11:25 PM   Specimen: Nasopharyngeal Swab; Respiratory  Result Value Ref Range Status   Adenovirus NOT DETECTED NOT DETECTED Final   Coronavirus 229E NOT DETECTED NOT DETECTED Final    Comment: (NOTE) The Coronavirus on the Respiratory Panel, DOES NOT test for the novel  Coronavirus (2019 nCoV)    Coronavirus HKU1 NOT DETECTED NOT DETECTED Final   Coronavirus NL63 NOT DETECTED NOT DETECTED Final   Coronavirus OC43 NOT DETECTED NOT DETECTED Final   Metapneumovirus NOT DETECTED NOT DETECTED Final   Rhinovirus / Enterovirus NOT DETECTED NOT DETECTED Final   Influenza A NOT DETECTED NOT DETECTED Final   Influenza B NOT DETECTED NOT DETECTED Final   Parainfluenza Virus 1 NOT DETECTED NOT DETECTED Final   Parainfluenza Virus 2 NOT DETECTED NOT DETECTED Final   Parainfluenza Virus 3 NOT DETECTED NOT DETECTED Final   Parainfluenza Virus 4 NOT DETECTED NOT DETECTED Final   Respiratory Syncytial Virus NOT DETECTED NOT  DETECTED Final   Bordetella pertussis NOT DETECTED NOT DETECTED Final   Bordetella Parapertussis NOT DETECTED NOT DETECTED Final   Chlamydophila pneumoniae NOT DETECTED NOT DETECTED Final   Mycoplasma pneumoniae NOT DETECTED NOT DETECTED Final    Comment: Performed at [facility], [place]. [address] [place], [place] [address]  Urine Culture     Status: Abnormal   Collection Time: 12/02/21  3:00 AM   Specimen: In/Out Cath Urine  Result Value Ref Range Status   Specimen Description   Final    IN/OUT CATH URINE Performed at [place] [address], [place], [place] [address]    Special Requests   Final    NONE Performed at [facility], [place] [address], [place], [place] [address]    Culture MULTIPLE SPECIES PRESENT, SUGGEST RECOLLECTION (A)  Final   Report Status 12/03/2021 FINAL  Final  MRSA Next Gen by PCR, Nasal     Status: None   Collection Time: 12/02/21  1:04 PM   Specimen: Nasal Mucosa; Nasal Swab  Result Value Ref Range Status   MRSA by PCR Next Gen NOT  DETECTED NOT DETECTED Final    Comment: (NOTE) The GeneXpert MRSA Assay (FDA approved for NASAL specimens only), is one component of a comprehensive MRSA colonization surveillance program. It is not intended to diagnose MRSA infection nor to guide or monitor treatment for MRSA infections. Test performance is not FDA approved in patients less than 32 years old. Performed at [facility], [place] [address] [place], [place] [address]   Culture, blood (Routine X 2) w Reflex to ID Panel     Status: None   Collection Time: 12/05/21 11:45 AM   Specimen: BLOOD  Result Value Ref Range Status   Specimen Description   Final    BLOOD SITE NOT SPECIFIED Performed at [place] [address] [place], [place] [address]    Special Requests   Final    BOTTLES DRAWN AEROBIC AND ANAEROBIC Blood Culture adequate volume Performed at [place] [address], [place], [place] [address]    Culture   Final    NO GROWTH 5 DAYS Performed at [facility], [place] [address] [place], [place] [address]    Report Status 12/10/2021 FINAL  Final  Culture, blood (Routine X 2) w Reflex to ID Panel     Status: None   Collection Time: 12/05/21 11:45 AM   Specimen: BLOOD  Result Value Ref Range Status   Specimen Description   Final    BLOOD SITE NOT SPECIFIED Performed at [place] [address] [place], [place] [address]    Special Requests   Final    BOTTLES DRAWN AEROBIC AND ANAEROBIC Blood Culture adequate volume Performed at [place] [address] [place], [place] [address]    Culture   Final    NO GROWTH 5 DAYS Performed at [facility], [place] [address] [place], [place] [address]    Report Status 12/10/2021 FINAL  Final  Expectorated Sputum Assessment w Gram Stain, Rflx to Resp Cult     Status: None   Collection Time: 12/05/21  6:35 PM   Specimen: Sputum  Result Value Ref Range Status   Specimen Description   Final    SPU Performed at [place] [address], [place], [place] [address]    Special Requests   Final    NONE Performed at [facility], [place] [address] [place], [place] [address]    Sputum evaluation THIS SPECIMEN IS ACCEPTABLE FOR SPUTUM CULTURE  Final   Report Status 12/05/2021 FINAL  Final  Culture, Respiratory w Gram Stain     Status: None   Collection Time: 12/05/21  6:35 PM   Specimen: Sputum  Result Value Ref Range Status   Specimen Description   Final    SPU Performed at [place] [address] [place], [place] [address]    Special Requests   Final    NONE Reflexed from [id number] Performed at [facility], [place] [address], [place], [place] [address]    Gram Stain   Final    FEW WBC PRESENT, PREDOMINANTLY MONONUCLEAR FEW SQUAMOUS EPITHELIAL  CELLS PRESENT FEW GRAM VARIABLE ROD FEW BUDDING YEAST SEEN    Culture   Final    FEW Normal respiratory flora-no Staph aureus or Pseudomonas seen Performed at [facility], [address], [place], [place] [address]    Report Status 12/08/2021 FINAL  Final  Culture, Respiratory w Gram Stain  Status: None   Collection Time: 12/06/21  6:15 PM   Specimen: Bronchoalveolar Lavage; Respiratory  Result Value Ref Range Status   Specimen Description   Final    BRONCHIAL ALVEOLAR LAVAGE Performed at [place] [address], [place], [place] [address]    Special Requests   Final    NONE Performed at [facility], [place] [address] [place], [place] [address]    Gram Stain   Final    FEW WBC PRESENT, PREDOMINANTLY MONONUCLEAR NO ORGANISMS SEEN    Culture   Final    NO GROWTH 2 DAYS Performed at [place] [address] [place], [place] [address]    Report Status 12/09/2021 FINAL  Final  Acid Fast Smear (AFB)     Status: None   Collection Time: 12/06/21  6:15 PM   Specimen: Bronchoalveolar Lavage; Respiratory  Result Value Ref Range Status   AFB Specimen Processing Concentration  Final   Acid Fast Smear Negative  Final    Comment: (NOTE) Performed At: [facility] [place], [place] [id number] Rush Farmer MD [id number]    Source (AFB) BRONCHIAL ALVEOLAR LAVAGE  Final    Comment: Performed at [place] [address] [place], [place] [address]  Aspergillus Ag, BAL/Serum     Status: None   Collection Time: 12/07/21  8:36 AM   Specimen: Bronchial Alveolar Lavage; Respiratory  Result Value Ref Range Status   Aspergillus Ag, BAL/Serum 0.07 0.00 - 0.49 Index Final    Comment: (NOTE) Performed At: [facility] [place], [place] [id number] Rush Farmer MD [id number]   Culture, Respiratory w Gram Stain     Status: None   Collection Time: 12/08/21  11:32 AM   Specimen: Bronchoalveolar Lavage; Respiratory  Result Value Ref Range Status   Specimen Description   Final    BRONCHIAL ALVEOLAR LAVAGE Performed at [place] [address] [place], [place] [address]    Special Requests   Final    NONE Performed at [facility], [place] [address], [place], [place] [address]    Gram Stain NO WBC SEEN NO ORGANISMS SEEN   Final   Culture   Final    NO GROWTH 2 DAYS Performed at [facility], [place] [address] [place], [place] [address]    Report Status 12/10/2021 FINAL  Final  Pneumocystis smear by DFA     Status: None   Collection Time: 12/08/21 11:32 AM   Specimen: Bronchoalveolar Lavage; Respiratory  Result Value Ref Range Status   Specimen Source-[facility] BRONCHIAL ALVEOLAR LAVAGE  Final   Pneumocystis jiroveci Ag NEGATIVE  Final    Comment: Performed at [place] Performed at [place] [address], [place], [place] [address]     Antimicrobials: Anti-infectives (From admission, onward)    Start     Dose/Rate Route Frequency Ordered Stop   12/09/21 1400  vancomycin (VANCOCIN) IVPB 1000 mg/200 mL premix        1,000 mg 200 mL/hr over 60 Minutes Intravenous Every 12 hours 12/09/21 1351     12/08/21 1000  voriconazole (VFEND) 200 mg in sodium chloride 0.9 % 100 mL IVPB  Status:  Discontinued       See Hyperspace for full Linked Orders Report.   200 mg 60 mL/hr over 120 Minutes Intravenous Every 12 hours 12/07/21 0841 12/08/21 0836   12/08/21 1000  micafungin (MYCAMINE) 100 mg in sodium chloride 0.9 % 100 mL IVPB        100 mg 105 mL/hr over 1 Hours Intravenous Every 24 hours 12/08/21 0836     12/07/21 2330  vancomycin (VANCOREADY) IVPB 750 mg/150 mL  Status:  Discontinued        750 mg 150 mL/hr over 60 Minutes Intravenous Every 24 hours 12/07/21 0000 12/09/21 1351   12/07/21 1000  voriconazole (VFEND) 300 mg in sodium chloride 0.9 % 100 mL  IVPB       See Hyperspace for full Linked Orders Report.   300 mg 65 mL/hr over 120 Minutes Intravenous Every 12 hours 12/07/21 0841 12/08/21 0007   12/07/21 0630  meropenem (MERREM) 1 g in sodium chloride 0.9 % 100 mL IVPB        1 g 200 mL/hr over 30 Minutes Intravenous Every 8 hours 12/06/21 2202     12/06/21 2000  micafungin (MYCAMINE) 100 mg in sodium chloride 0.9 % 100 mL IVPB  Status:  Discontinued        100 mg 105 mL/hr over 1 Hours Intravenous Every 24 hours 12/06/21 1838 12/07/21 0841   12/05/21 1630  meropenem (MERREM) 1 g in sodium chloride 0.9 % 100 mL IVPB  Status:  Discontinued        1 g 200 mL/hr over 30 Minutes Intravenous Every 12 hours 12/05/21 1521 12/05/21 1549   12/05/21 1630  vancomycin (VANCOREADY) IVPB 750 mg/150 mL  Status:  Discontinued        750 mg 150 mL/hr over 60 Minutes Intravenous Every 24 hours 12/05/21 1521 12/07/21 0000   12/05/21 1630  meropenem (MERREM) 1 g in sodium chloride 0.9 % 100 mL IVPB  Status:  Discontinued        1 g 200 mL/hr over 30 Minutes Intravenous Every 8 hours 12/05/21 1549 12/06/21 2202   12/03/21 0800  vancomycin (VANCOREADY) IVPB 750 mg/150 mL  Status:  Discontinued        750 mg 150 mL/hr over 60 Minutes Intravenous Every 24 hours 12/02/21 0923 12/04/21 1041   12/02/21 1045  metroNIDAZOLE (FLAGYL) IVPB 500 mg  Status:  Discontinued        500 mg 100 mL/hr over 60 Minutes Intravenous Every 12 hours 12/02/21 1021 12/05/21 1515   12/02/21 1000  ceFEPIme (MAXIPIME) 2 g in sodium chloride 0.9 % 100 mL IVPB  Status:  Discontinued        2 g 200 mL/hr over 30 Minutes Intravenous Every 12 hours 12/02/21 0923 12/05/21 1515   12/02/21 0800  vancomycin (VANCOCIN) IVPB 1000 mg/200 mL premix        1,000 mg 200 mL/hr over 60 Minutes Intravenous  Once 12/02/21 0754 12/02/21 0913   12/01/21 2330  ceFEPIme (MAXIPIME) 2 g in sodium chloride 0.9 % 100 mL IVPB        2 g 200 mL/hr over 30 Minutes Intravenous  Once 12/01/21 2315 12/01/21  2352   12/01/21 2330  azithromycin (ZITHROMAX) 500 mg in sodium chloride 0.9 % 250 mL IVPB        500 mg 250 mL/hr over 60 Minutes Intravenous  Once 12/01/21 2315 12/02/21 0026      Culture/Microbiology    Component Value Date/Time   SDES  12/08/2021 1132    BRONCHIAL ALVEOLAR LAVAGE Performed at [facility], [place] [address] [place], [place] [address]    SPECREQUEST  12/08/2021 1132    NONE Performed at  [facility], [place] [address], [place], [place] [address]    CULT  12/08/2021 1132    NO GROWTH 2 DAYS Performed at [facility] [address], [place], [place] [address]    REPTSTATUS 12/10/2021 FINAL 12/08/2021 1132  Radiology Studies: No results found.   LOS: 9 days   Antonieta Pert, MD [facility]  12/11/2021, 10:08 AM

## 2021-12-11 NOTE — Progress Notes (Signed)
Peripherally Inserted Central Catheter Placement  The IV Nurse has discussed with the patient and/or persons authorized to consent for the patient, the purpose of this procedure and the potential benefits and risks involved with this procedure.  The benefits include less needle sticks, lab draws from the catheter, and the patient may be discharged home with the catheter. Risks include, but not limited to, infection, bleeding, blood clot (thrombus formation), and puncture of an artery; nerve damage and irregular heartbeat and possibility to perform a PICC exchange if needed/ordered by physician.  Alternatives to this procedure were also discussed.  Bard Power PICC patient education guide, fact sheet on infection prevention and patient information card has been provided to patient /or left at bedside.    PICC Placement Documentation  PICC Double Lumen 30/94/07 Right Basilic 34 cm 0 cm (Active)  Indication for Insertion or Continuance of Line Poor Vasculature-patient has had multiple peripheral attempts or PIVs lasting less than 24 hours 12/11/21 1454  Exposed Catheter (cm) 0 cm 12/11/21 1454  Site Assessment Clean, Dry, Intact 12/11/21 1454  Lumen #1 Status Saline locked;Flushed;Blood return noted 12/11/21 1454  Lumen #2 Status Saline locked;Flushed;Blood return noted 12/11/21 1454  Dressing Type Transparent;Securing device 12/11/21 1454  Dressing Status Antimicrobial disc in place;Clean, Dry, Intact 12/11/21 1454  Safety Lock Not Applicable 68/08/81 1031  Line Care Connections checked and tightened 12/11/21 1454  Line Adjustment (NICU/IV Team Only) No 12/11/21 1454  Dressing Intervention New dressing 12/11/21 1454  Dressing Change Due 12/18/21 12/11/21 [place], Kaia [place] Chenice 12/11/2021, 2:55 PM

## 2021-12-11 NOTE — Progress Notes (Signed)
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 Was in [place] until about 2d PTA developed weakness, fatigue. Family saw her yesterday, confused, febrile, called EMS. She says she has some cough but not expectorating anything. Doesn't report any other infectious sx.   Had PLT transfusion 11/4. In ED febrile and found to have LLL/lingula pneumonia, given vanc/cefepime/azithro, 2.75L LR, started on LR infusion. Transfused 1U pRBC at 0700 11/9.  Also noted to have worsening hyponatremia na 122 (corrected 124-126). Lactic 8 ->2.2, worsening pancytopenia  She has been maintained on broad-spectrum antibiotics and has been followed by infectious disease.  PCCM called back on 11/13 due to progressive respiratory failure with severe hypoxia and tachypnea. Intubated, central access placed, vasopressors added.   Pertinent  Medical History  HTN Anemia DM2 Breast CA MDS  Significant Hospital Events: Including procedures, antibiotic start and stop dates in addition to other pertinent events   11/9 admitted, broad spectrum antibiotics 11/12 escalated to meropenem 11/13 intubated, bronch for BAL for cultures 11/15 bronch for assessment of DAH, repeat cultures 11/16 extubated 11/17 steroids weaned, 1U PLT  Interim History / Subjective:  No acute issues  Weaned O2 to 3L  A little dyspneic with minimal activity  Objective   Blood pressure (!) 159/75, pulse 75, temperature 98.4 F (36.9 C), temperature source Oral, resp. rate (!) 25, height 5' (1.524 m), weight  55.5 kg, SpO2 98 %. CVP:  [8 mmHg] 8 mmHg      Intake/Output Summary (Last 24 hours) at 12/11/2021 1118 Last data filed at 12/11/2021 1112 Gross per 24 hour  Intake 1877.9 ml  Output 3625 ml  Net -1747.1 ml   Filed Weights   12/08/21 0500 12/09/21 0414 12/10/21 0400  Weight: 55.9 kg 56.3 kg 55.5 kg    Examination: General appearance: 66 y.o.,, female, NAD, conversant  Eyes: PERRL, tracking appropriately HENT: NCAT; dry MM Lungs: diminished bl, with normal respiratory effort CV: RRR, no murmur  Abdomen: Soft, non-tender; non-distended, BS present  Extremities: No peripheral edema, warm Skin: Normal turgor and texture; no rash Neuro: grossly nonfocal   Na 145 BUN/S Cr trending down WBC 1.6 Hb 9 PLT 22   Infx workup unrevealing to date  No new imaging  Resolved Hospital Problem list   Shock - unclear if septic or if SIRS related response to aspiration Hyponatremia Hypokalemia Hypophosphatemia  Assessment & Plan:   ARDS Acute hypercarbic & hypoxic respiratory failure Multilobar pneumonia vs DAH in immunocompromised host Good chance this may represent bland alveolar hemorrhage from thrombocytopenia, alternatively infectious issue but infx workup to date unrevealing - ABX per ID - prednisone 60 mg daily for a week, tentative plan to taper by 10 mg each week until clinic follow up which we will arrange  - wean FiO2 for O2 saturation 92% and please ensure humidified O2  - PT/mobilize  Other issues per primary team  Best Practice (right click and "Reselect all SmartList Selections" daily)  Diet/type: Regular consistency (see orders)  DVT prophylaxis: SCD GI prophylaxis: PPI-high-dose while on steroids with such severe  platelet deficiency. Lines: Central line - would remove and place PICC Foley:  removal ordered   Code Status:  full code Last date of multidisciplinary goals of care discussion: 11/17 with video interpreter, pt, daughter [name]. Remains full code.    Critical Care Time:  na    [name] 12/11/21 11:18 AM [facility]

## 2021-12-12 DIAGNOSIS — D61818 Other pancytopenia: Secondary | ICD-10-CM | POA: Diagnosis not present

## 2021-12-12 LAB — BASIC METABOLIC PANEL
Anion gap: 6 (ref 5–15)
BUN: 23 mg/dL (ref 8–23)
CO2: 31 mmol/L (ref 22–32)
Calcium: 8.5 mg/dL — ABNORMAL LOW (ref 8.9–10.3)
Chloride: 103 mmol/L (ref 98–111)
Creatinine, Ser: 0.37 mg/dL — ABNORMAL LOW (ref 0.44–1.00)
GFR, Estimated: >60 mL/min (ref >60)
Glucose, Bld: 172 mg/dL — ABNORMAL HIGH (ref 70–99)
Potassium: 3.4 mmol/L — ABNORMAL LOW (ref 3.5–5.1)
Sodium: 140 mmol/L (ref 135–145)

## 2021-12-12 LAB — CBC
HCT: 27.1 % — ABNORMAL LOW (ref 36.0–46.0)
Hemoglobin: 8.8 g/dL — ABNORMAL LOW (ref 12.0–15.0)
MCH: 29.8 pg (ref 26.0–34.0)
MCHC: 32.5 g/dL (ref 30.0–36.0)
MCV: 91.9 fL (ref 80.0–100.0)
Platelets: 13 10*3/uL — CL (ref 150–400)
RBC: 2.95 MIL/uL — ABNORMAL LOW (ref 3.87–5.11)
RDW: 15.2 % (ref 11.5–15.5)
WBC: 1.5 10*3/uL — ABNORMAL LOW (ref 4.0–10.5)
nRBC: 0 % (ref 0.0–0.2)

## 2021-12-12 LAB — VANCOMYCIN, TROUGH: Vancomycin Tr: 8 ug/mL — ABNORMAL LOW (ref 15–20)

## 2021-12-12 LAB — GLUCOSE, CAPILLARY
Glucose-Capillary: 153 mg/dL — ABNORMAL HIGH (ref 70–99)
Glucose-Capillary: 313 mg/dL — ABNORMAL HIGH (ref 70–99)
Glucose-Capillary: 314 mg/dL — ABNORMAL HIGH (ref 70–99)
Glucose-Capillary: 331 mg/dL — ABNORMAL HIGH (ref 70–99)

## 2021-12-12 MED ORDER — FOOD THICKENER (SIMPLYTHICK): 1.0000 | ORAL | Status: DC | PRN

## 2021-12-12 MED ORDER — BOOST / RESOURCE BREEZE PO LIQD CUSTOM
1.0000 | ORAL | Status: DC
  Administered 2021-12-12 – 2021-12-15 (×4): 1 via ORAL

## 2021-12-12 MED ORDER — ACETAMINOPHEN 325 MG PO TABS
650.0000 mg | ORAL_TABLET | Freq: Once | ORAL | Status: AC
  Administered 2021-12-12: 650 mg via ORAL
  Filled 2021-12-12: qty 2

## 2021-12-12 MED ORDER — POTASSIUM CHLORIDE CRYS ER 20 MEQ PO TBCR
40.0000 meq | EXTENDED_RELEASE_TABLET | Freq: Once | ORAL | Status: AC
  Administered 2021-12-12: 40 meq via ORAL
  Filled 2021-12-12: qty 2

## 2021-12-12 MED ORDER — SODIUM CHLORIDE 0.9% IV SOLUTION: Freq: Once | INTRAVENOUS | Status: AC

## 2021-12-12 MED ORDER — ADULT MULTIVITAMIN W/MINERALS CH
1.0000 | ORAL_TABLET | Freq: Every day | ORAL | Status: DC
  Administered 2021-12-12 – 2021-12-22 (×11): 1 via ORAL
  Filled 2021-12-12 (×11): qty 1

## 2021-12-12 MED ORDER — ENSURE ENLIVE PO LIQD
237.0000 mL | ORAL | Status: DC
  Administered 2021-12-12 – 2021-12-14 (×3): 237 mL via ORAL

## 2021-12-12 MED ORDER — VANCOMYCIN HCL 1250 MG/250ML IV SOLN
1250.0000 mg | Freq: Two times a day (BID) | INTRAVENOUS | Status: DC
  Administered 2021-12-12 – 2021-12-13 (×2): 1250 mg via INTRAVENOUS
  Filled 2021-12-12 (×3): qty 250

## 2021-12-12 NOTE — Progress Notes (Signed)
PROGRESS NOTE Amanda Duarte  AJO:[id number] DOB: 1955/03/01 DOA: 12/01/2021 PCP: [name], NP   Brief Narrative/Hospital Course: 66 yof w/ left breast cancer (Dx 04/2017 S/P left mastectomy and adjuvant radiation), HTN HLD T2DM and recent diagnosis of high-grade myeloid Neoplasm/myelodysplastic syndrome (Bone marrow biopsy 10/2 with confirmatory biopsy 10/11 revealing 7% blasts) thought to be caused by history of chemotherapy complicated by pancytopenia presented to [facility]/ fatigue initially found to have sepsis secondary to left lower lobe pneumonia and admitted on 12/02/21.  Despite aggressive treatment with broad-spectrum intravenous antibiotics in the days that followed patient clinically worsened with recurrent fevers and worsening shortness of breath with progressively worsening acute hypoxic respiratory failure eventually prompting PCCM involvement and broadening of antibiotic coverage under the direction of infectious disease.   Hospital course was complicated by persisting pancytopenia requiring intermittent transfusions of blood products including platelets (1 pack on 11/4, 1 pack 11/16 and 1 pack 11/17),  and packed red blood cells (11/9).  Dr. Annamaria Boots with oncology has been following.  Current targets are transfusions for platelet count of less than 15 and hemoglobin of less than 7.   Unfortunately due to progressive respiratory failure patient eventually required intubation on 11/13 for progressive respiratory failure and transfer to the intensive care unit.  A bronchoscopy was performed at that time with BAL performed for cultures.  Repeat bronchoscopy was performed on 11/15 revealing evidence pulmonary hemorrhage, thought to be secondary to recurrent severe thrombocytopenia.  Hospital course was further complicated by the development of septic shock requiring intermittent vasopressors.  Patient was managed with Neo-Synephrine which has since been weaned off.  Patient also developed episodes of  paroxysmal atrial fibrillation.  Amiodarone was initially given but later this was discontinued on 11/14 after having episodes of polymorphic ventricular tachycardia and prolonged QTc.   Patient has since been weaned off of vasopressors.  Patient has been extubated on 11/16.  Patient is currently weaned to 5 L of oxygen via nasal cannula.  11/18: Transferred back to [facility] service.  Worked with PT 11/17 advised home health PT with RW 11/18> left IJ removed and RUE PICC line placed    Subjective: Seen and examined this morning.  Multiple family at the bedside.  Patient alert awake resting comfortably has no complaints. Overnight afebrile on 3 L nasal cannula Labs this morning potassium 3.4 WBC 1.5 hemoglobin 8.8 platelet count 13 K  Assessment and Plan: ARDS Acute hypoxic and hypercapnic respiratory failure Multilobar pneumonia versus DAH immunocompromised setting Septic shock-Resolved: Initially in the ICU needing intubation, vasopressor support. Currently hemodynamically stable, afebrile.  Continue wean oxygen down,Blood culture 11/12> NGTD, BAL culture 11/15 NGTD. Continue current vancomycin, meropenem, micafungin with PICC line per ID, encourage mobilization PT OT   PAF/polymorphic VT with prolonged Qtc:off amio. On monitor. Hypertension: BP running in 150s to 170s borderline controlled resume losartan M CONT PRN HYDRALAZINE HLD Resume lipitor   Pancytopenia MDS Previous breast cancer history: [name] following.  S/p 2 Units PRBC 11/9 and 11/3. S/p multiple platelet transfusions (on  11/17, 11/16, 12/15, 11/13 x 2 and 11/9).continue Solu-Medrol> changed to 60 mg daily 11/18.  Continue to monitor CBC daily> GOAL HB > 7 AND PLT > 15K-we will order 1 unit platelet transfusion-patient and family agreeable Recent Labs  Lab 12/10/21 0440 12/11/21 0451 12/12/21 0226  HGB 7.6* 9.1* 8.8*  HCT 23.4* 27.9* 27.1*  WBC 1.3* 1.6* 1.5*  PLT 11* 22* 13*   T2DM with uncontrolled hyperglycemia: at  home on  Amaryl 4 mg, Lantus 25 bedtime Janumet, Humalog 4 units Premeal .  Blood sugar fairly stable continue 20u Levemir bid, SSI 0-20 units,4 u premeal novolog. Of note on steroid here so monitor closely. Recent Labs  Lab 12/11/21 0811 12/11/21 1106 12/11/21 1556 12/11/21 2010 12/12/21 0734  GLUCAP 277* 248* 357* 392* 153*     Hypophosphatemia Hypokalemia: Monitor and replete again. Recent Labs  Lab 12/07/21 0245 12/07/21 1112 12/08/21 0509 12/09/21 0413 12/09/21 1200 12/10/21 0440 12/11/21 0451 12/12/21 0226  K 4.3 3.8 3.5 5.7* 4.5 3.4* 3.6 3.4*  CALCIUM 8.1* 7.7* 8.2* 8.5* 8.8* 8.7* 8.6* 8.5*  MG 2.0 2.0 2.7* 2.7*  --  2.1  --   --   PHOS 6.0*  --  2.3* 3.9  --  3.5  --   --    Goals of care currently full code.  It seems he has made significant progress, continue current supportive measures.  Per hematology reasonable to be full code unless patient family wants DNR, continue to monitor encourage palliative care discussion for goals of care. PMT following  DVT prophylaxis: SCDs Start: 12/02/21 1022 Code Status:   Code Status: Full Code Family Communication: plan of care discussed with patient and her daughter at bedside.  Patient status is: Patient because of pancytopenia, respiratory failure Level of care: Progressive  Dispo: The patient is from: home            Anticipated disposition: TBD in  Mobility Assessment (last 72 hours)     Mobility Assessment     Row Name 12/11/21 2039 12/11/21 0812 12/11/21 0500 12/10/21 1700 12/10/21 1500   Does patient have an order for bedrest or is patient medically unstable No - Continue assessment Yes- Bedfast (Level 1) - Complete Yes- Bedfast (Level 1) - Complete -- --   What is the highest level of mobility based on the progressive mobility assessment? -- Level 3 (Stands with assist) - Balance while standing  and cannot march in place Level 3 (Stands with assist) - Balance while standing  and cannot march in place Level 3 (Stands  with assist) - Balance while standing  and cannot march in place Level 3 (Stands with assist) - Balance while standing  and cannot march in place   Is the above level different from baseline mobility prior to current illness? -- Yes - Recommend PT order -- -- --             Objective: Vitals last 24 hrs: Vitals:   12/11/21 1700 12/11/21 1800 12/11/21 1949 12/12/21 0416  BP:   (!) 169/81 (!) 157/79  Pulse:   83 67  Resp: (!) 24 (!) _0 Temp:   98.1 F (36.7 C) 98.9 F (37.2 C)  TempSrc:      SpO2:   96% 97%  Weight:    60 kg  Height:       Weight change:   Physical Examination: General exam: alert awake, oriented, frail HEENT:Oral mucosa moist, Ear/Nose WNL grossly Respiratory system: Bilaterally clear BS, no use of accessory muscle Cardiovascular system: S1 & S2 +, No JVD. Gastrointestinal system: Abdomen soft,NT,ND, BS+ Nervous System: Alert, awake, moving extremities, she follows commands. Extremities: LE edema neg,distal peripheral pulses palpable.  Skin: No rashes,no icterus. MSK: Normal muscle bulk,tone, power Right upper extremity PICC line in place, left IJ catheter site with dressing in place no bleeding  Medications reviewed:  Scheduled Meds:  sodium chloride   Intravenous Once   sodium chloride  Intravenous Once   sodium chloride   Intravenous Once   acetaminophen  650 mg Oral Once   atorvastatin  40 mg Oral Daily   Chlorhexidine Gluconate Cloth  6 each Topical Daily   docusate  100 mg Per Tube BID   feeding supplement  1 Container Oral Q24H   feeding supplement  237 mL Oral Q24H   insulin aspart  0-20 Units Subcutaneous TID WC   insulin aspart  0-5 Units Subcutaneous QHS   insulin aspart  4 Units Subcutaneous TID WC   insulin detemir  20 Units Subcutaneous Q12H   losartan  25 mg Oral Daily   multivitamin with minerals  1 tablet Oral Daily   pantoprazole (PROTONIX) IV  40 mg Intravenous Q12H   polyethylene glycol  17 g Per Tube Daily    predniSONE  60 mg Oral Q breakfast   sodium chloride flush  10-40 mL Intracatheter Q12H   [START ON 12/13/2021] sulfamethoxazole-trimethoprim  1 tablet Oral Once per day on Mon Wed Fri   Thrombi-Pad  1 each Topical Once   Continuous Infusions:  sodium chloride Stopped (12/07/21 1409)   meropenem (MERREM) IV 1 g (12/12/21 0620)   micafungin (MYCAMINE) 100 mg in sodium chloride 0.9 % 100 mL IVPB Stopped (12/11/21 1052)   vancomycin 1,000 mg (12/12/21 0307)      Diet Order             DIET DYS 3 Room service appropriate? Yes; Fluid consistency: Nectar Thick  Diet effective now                    Nutrition Problem: Moderate Malnutrition Etiology: chronic illness Signs/Symptoms: energy intake < or equal to 75% for > or equal to 1 month, mild fat depletion, mild muscle depletion Interventions: Ensure Enlive (each supplement provides 350kcal and 20 grams of protein), Magic cup, Boost Breeze, MVI   Intake/Output Summary (Last 24 hours) at 12/12/2021 0923 Last data filed at 12/12/2021 0419 Gross per 24 hour  Intake 2063 ml  Output 1601 ml  Net 462 ml   Net IO Since Admission: 10,015.38 mL [12/12/21 0923]  Wt Readings from Last 3 Encounters:  12/12/21 60 kg  11/23/21 51.5 kg  11/17/21 51.7 kg     Unresulted Labs (From admission, onward)     Start     Ordered   12/12/21 8250  Prepare platelet pheresis  (Blood Administration Adult)  Once,   R       Question Answer Comment  Number of Apheresis Units (1 unit of apheresis platelets will increase platelets 30,000/mL in an avg sized adult) 1 unit   Transfusion Indications Other   Comments platelet goal >15k   Date/Time blood product needed For transfusion   If emergent release call blood bank Not emergent release      12/12/21 0838   12/11/21 5397  Basic metabolic panel  Daily at 5am,   R     Question:  Specimen collection method  Answer:  Unit=Unit collect   12/10/21 1558   12/11/21 0500  CBC  Daily at 5am,   R      Question:  Specimen collection method  Answer:  Unit=Unit collect   12/10/21 1558   12/06/21 1822  Fungus Culture With Stain  ONCE - URGENT,   URGENT       Comments: RML    12/06/21 1821   12/06/21 1822  Acid Fast Culture with reflexed sensitivities  (AFB smear + Culture w  reflexed sensitivities panel)  Once,   R       See Hyperspace for full Linked Orders Report.   12/06/21 1821   12/06/21 1336  Histoplasma antigen, urine  Once,   R        12/06/21 1335   12/05/21 0000  Type and screen  R       Comments: [place]    12/05/21 2206          Data Reviewed: I have personally reviewed following labs and imaging studies CBC: Recent Labs  Lab 12/06/21 0249 12/07/21 0245 12/08/21 0509 12/09/21 0413 12/09/21 1200 12/10/21 0440 12/11/21 0451 12/12/21 0226  WBC 1.3* 1.6*   < > 1.4* 1.4* 1.3* 1.6* 1.5*  NEUTROABS 0.4* 0.5*  --   --  0.4*  --   --   --   HGB 7.0* 8.7*   < > 7.3* 7.7* 7.6* 9.1* 8.8*  HCT 20.1* 26.8*   < > 22.8* 23.8* 23.4* 27.9* 27.1*  MCV 87.4 93.4   < > 94.2 94.4 92.5 93.0 91.9  PLT 7* 34*   < > 9* 25* 11* 22* 13*   < > = values in this interval not displayed.   Basic Metabolic Panel: Recent Labs  Lab 12/07/21 0245 12/07/21 1112 12/08/21 0509 12/09/21 0413 12/09/21 1200 12/10/21 0440 12/11/21 0451 12/12/21 0226  NA 135 138 141 145 146* 151* 145 140  K 4.3 3.8 3.5 5.7* 4.5 3.4* 3.6 3.4*  CL 102 105 110 115* 114* 116* 110 103  CO2 18* _0 GLUCOSE 193* 324* 363* 185* 224* 175* 354* 172*  BUN 28* 42* 56* 60* 59* 42* 30* 23  CREATININE 0.87 0.81 0.62 0.46 0.56 0.32* 0.40* 0.37*  CALCIUM 8.1* 7.7* 8.2* 8.5* 8.8* 8.7* 8.6* 8.5*  MG 2.0 2.0 2.7* 2.7*  --  2.1  --   --   PHOS 6.0*  --  2.3* 3.9  --  3.5  --   --    Recent Results (from the past 240 hour(s))  MRSA Next Gen by PCR, Nasal     Status: None   Collection Time: 12/02/21  1:04 PM   Specimen: Nasal Mucosa; Nasal Swab  Result Value Ref Range Status   MRSA  by PCR Next Gen NOT DETECTED NOT DETECTED Final    Comment: (NOTE) The GeneXpert MRSA Assay (FDA approved for NASAL specimens only), is one component of a comprehensive MRSA colonization surveillance program. It is not intended to diagnose MRSA infection nor to guide or monitor treatment for MRSA infections. Test performance is not FDA approved in patients less than 68 years old. Performed at [facility], [place] [address] [place], [place] [address]   Culture, blood (Routine X 2) w Reflex to ID Panel     Status: None   Collection Time: 12/05/21 11:45 AM   Specimen: BLOOD  Result Value Ref Range Status   Specimen Description   Final    BLOOD SITE NOT SPECIFIED Performed at [place] [address], [place], [place] [address]    Special Requests   Final    BOTTLES DRAWN AEROBIC AND ANAEROBIC Blood Culture adequate volume Performed at [place] [address] [place], [place] [address]    Culture   Final    NO GROWTH 5 DAYS Performed at [facility], [place] [address], [place],  [address]    Report Status  12/10/2021 FINAL  Final  Culture, blood (Routine X 2) w Reflex to ID Panel     Status: None   Collection Time: 12/05/21 11:45 AM   Specimen: BLOOD  Result Value Ref Range Status   Specimen Description   Final    BLOOD SITE NOT SPECIFIED Performed at [place] [address], [place], [place] [address]    Special Requests   Final    BOTTLES DRAWN AEROBIC AND ANAEROBIC Blood Culture adequate volume Performed at [place] [address], [place], [place] [address]    Culture   Final    NO GROWTH 5 DAYS Performed at [facility], [place] [address], [place], [place] [address]    Report Status 12/10/2021 FINAL  Final  Expectorated Sputum Assessment w Gram Stain, Rflx to Resp Cult     Status: None   Collection Time: 12/05/21  6:35 PM   Specimen: Sputum   Result Value Ref Range Status   Specimen Description   Final    SPU Performed at [place] [address], [place], [place] [address]    Special Requests   Final    NONE Performed at [facility], [place] [address] [place], [place] [address]    Sputum evaluation THIS SPECIMEN IS ACCEPTABLE FOR SPUTUM CULTURE  Final   Report Status 12/05/2021 FINAL  Final  Culture, Respiratory w Gram Stain     Status: None   Collection Time: 12/05/21  6:35 PM   Specimen: Sputum  Result Value Ref Range Status   Specimen Description   Final    SPU Performed at [place] [address], [place], [place] [address]    Special Requests   Final    NONE Reflexed from [id number] Performed at [facility], [place] [address], [place], [place] [address]    Gram Stain   Final    FEW WBC PRESENT, PREDOMINANTLY MONONUCLEAR FEW SQUAMOUS EPITHELIAL CELLS PRESENT FEW GRAM VARIABLE ROD FEW BUDDING YEAST SEEN    Culture   Final    FEW Normal respiratory flora-no Staph aureus or Pseudomonas seen Performed at [facility], [address] [place], [place] [address]    Report Status 12/08/2021 FINAL  Final  Culture, Respiratory w Gram Stain     Status: None   Collection Time: 12/06/21  6:15 PM   Specimen: Bronchoalveolar Lavage; Respiratory  Result Value Ref Range Status   Specimen Description   Final    BRONCHIAL ALVEOLAR LAVAGE Performed at [place] [address] [place], [place] [address]    Special Requests   Final    NONE Performed at [facility], [place] [address] [place], [place] [address]    Gram Stain   Final    FEW WBC PRESENT, PREDOMINANTLY MONONUCLEAR NO ORGANISMS SEEN    Culture   Final    NO GROWTH 2 DAYS Performed at [place] [address], [place],  [address]    Report Status 12/09/2021 FINAL  Final  Acid Fast Smear (AFB)     Status: None    Collection Time: 12/06/21  6:15 PM   Specimen: Bronchoalveolar Lavage; Respiratory  Result Value Ref Range Status   AFB Specimen Processing Concentration  Final   Acid Fast Smear Negative  Final    Comment: (NOTE) Performed At: [facility] [place], [place] [id number] Rush Farmer MD YO:[id number]  Source (AFB) BRONCHIAL ALVEOLAR LAVAGE  Final    Comment: Performed at [facility], [place] [address] [place], [place] [address]  Aspergillus Ag, BAL/Serum     Status: None   Collection Time: 12/07/21  8:36 AM   Specimen: Bronchial Alveolar Lavage; Respiratory  Result Value Ref Range Status   Aspergillus Ag, BAL/Serum 0.07 0.00 - 0.49 Index Final    Comment: (NOTE) Performed At: [facility] [place], [place] [id number] Rush Farmer MD VO:[id number]   Culture, Respiratory w Gram Stain     Status: None   Collection Time: 12/08/21 11:32 AM   Specimen: Bronchoalveolar Lavage; Respiratory  Result Value Ref Range Status   Specimen Description   Final    BRONCHIAL ALVEOLAR LAVAGE Performed at [place] [address] [place], [place] [address]    Special Requests   Final    NONE Performed at [facility], [place] [address] [place], [place] [address]    Gram Stain NO WBC SEEN NO ORGANISMS SEEN   Final   Culture   Final    NO GROWTH 2 DAYS Performed at [facility], [place] [address] [place], [place] [address]    Report Status 12/10/2021 FINAL  Final  Pneumocystis smear by DFA     Status: None   Collection Time: 12/08/21 11:32 AM   Specimen: Bronchoalveolar Lavage; Respiratory  Result Value Ref Range Status   Specimen Source-[facility] BRONCHIAL ALVEOLAR LAVAGE  Final   Pneumocystis jiroveci Ag NEGATIVE  Final    Comment: Performed at [place] Performed at [place] [address] [place], [place] [address]      Antimicrobials: Anti-infectives (From admission, onward)    Start     Dose/Rate Route Frequency Ordered Stop   12/13/21 1000  sulfamethoxazole-trimethoprim (BACTRIM DS) 800-160 MG per tablet 1 tablet        1 tablet Oral Once per day on Mon Wed Fri 12/11/21 1227     12/09/21 1400  vancomycin (VANCOCIN) IVPB 1000 mg/200 mL premix        1,000 mg 200 mL/hr over 60 Minutes Intravenous Every 12 hours 12/09/21 1351     12/08/21 1000  voriconazole (VFEND) 200 mg in sodium chloride 0.9 % 100 mL IVPB  Status:  Discontinued       See Hyperspace for full Linked Orders Report.   200 mg 60 mL/hr over 120 Minutes Intravenous Every 12 hours 12/07/21 0841 12/08/21 0836   12/08/21 1000  micafungin (MYCAMINE) 100 mg in sodium chloride 0.9 % 100 mL IVPB        100 mg 105 mL/hr over 1 Hours Intravenous Every 24 hours 12/08/21 0836     12/07/21 2330  vancomycin (VANCOREADY) IVPB 750 mg/150 mL  Status:  Discontinued        750 mg 150 mL/hr over 60 Minutes Intravenous Every 24 hours 12/07/21 0000 12/09/21 1351   12/07/21 1000  voriconazole (VFEND) 300 mg in sodium chloride 0.9 % 100 mL IVPB       See Hyperspace for full Linked Orders Report.   300 mg 65 mL/hr over 120 Minutes Intravenous Every 12 hours 12/07/21 0841 12/08/21 0007   12/07/21 0630  meropenem (MERREM) 1 g in sodium chloride 0.9 % 100 mL IVPB        1 g 200 mL/hr over 30 Minutes Intravenous Every 8 hours 12/06/21 2202     12/06/21 2000  micafungin (MYCAMINE) 100 mg in sodium chloride 0.9 % 100 mL IVPB  Status:  Discontinued        100 mg 105 mL/hr over 1 Hours Intravenous Every 24 hours 12/06/21 1838 12/07/21 0841   12/05/21 1630  meropenem (MERREM) 1 g in sodium chloride 0.9 % 100 mL IVPB  Status:  Discontinued        1 g 200 mL/hr over 30 Minutes Intravenous Every 12 hours 12/05/21 1521 12/05/21 1549   12/05/21 1630  vancomycin (VANCOREADY) IVPB 750 mg/150 mL  Status:  Discontinued        750 mg 150 mL/hr over 60 Minutes Intravenous  Every 24 hours 12/05/21 1521 12/07/21 0000   12/05/21 1630  meropenem (MERREM) 1 g in sodium chloride 0.9 % 100 mL IVPB  Status:  Discontinued        1 g 200 mL/hr over 30 Minutes Intravenous Every 8 hours 12/05/21 1549 12/06/21 2202   12/03/21 0800  vancomycin (VANCOREADY) IVPB 750 mg/150 mL  Status:  Discontinued        750 mg 150 mL/hr over 60 Minutes Intravenous Every 24 hours 12/02/21 0923 12/04/21 1041   12/02/21 1045  metroNIDAZOLE (FLAGYL) IVPB 500 mg  Status:  Discontinued        500 mg 100 mL/hr over 60 Minutes Intravenous Every 12 hours 12/02/21 1021 12/05/21 1515   12/02/21 1000  ceFEPIme (MAXIPIME) 2 g in sodium chloride 0.9 % 100 mL IVPB  Status:  Discontinued        2 g 200 mL/hr over 30 Minutes Intravenous Every 12 hours 12/02/21 0923 12/05/21 1515   12/02/21 0800  vancomycin (VANCOCIN) IVPB 1000 mg/200 mL premix        1,000 mg 200 mL/hr over 60 Minutes Intravenous  Once 12/02/21 0754 12/02/21 0913   12/01/21 2330  ceFEPIme (MAXIPIME) 2 g in sodium chloride 0.9 % 100 mL IVPB        2 g 200 mL/hr over 30 Minutes Intravenous  Once 12/01/21 2315 12/01/21 2352   12/01/21 2330  azithromycin (ZITHROMAX) 500 mg in sodium chloride 0.9 % 250 mL IVPB        500 mg 250 mL/hr over 60 Minutes Intravenous  Once 12/01/21 2315 12/02/21 0026      Culture/Microbiology    Component Value Date/Time   SDES  12/08/2021 1132    BRONCHIAL ALVEOLAR LAVAGE Performed at [facility], [place] [address] [place], [place] [address]    SPECREQUEST  12/08/2021 1132    NONE Performed at [facility], [place] [address] [place], [place] [address]    CULT  12/08/2021 1132    NO GROWTH 2 DAYS Performed at [place] [address], [place], [place] [address]    REPTSTATUS 12/10/2021 FINAL 12/08/2021 1132  Radiology Studies: [name] EKG SITE RITE  Result Date: 12/11/2021 If Site Rite image not attached, placement could not be confirmed due to current  cardiac rhythm.    LOS: 10 days   Antonieta Pert, MD [facility]  12/12/2021, 9:23 AM

## 2021-12-12 NOTE — Plan of Care (Signed)
  Problem: Coping: Goal: Ability to adjust to condition or change in health will improve Outcome: Progressing   Problem: Fluid Volume: Goal: Ability to maintain a balanced intake and output will improve Outcome: Progressing   Problem: Nutritional: Goal: Maintenance of adequate nutrition will improve Outcome: Progressing   Problem: Tissue Perfusion: Goal: Adequacy of tissue perfusion will improve Outcome: Progressing   Problem: Education: Goal: Knowledge of General Education information will improve Description: Including pain rating scale, medication(s)/side effects and non-pharmacologic comfort measures Outcome: Progressing

## 2021-12-12 NOTE — Progress Notes (Signed)
Pharmacy Antibiotic Note  Amanda Duarte is a 66 y.o. female admitted on 12/01/2021 with fatigue, SOB, weakness, and breast cancer on chemotherapy.  Pharmacy has been consulted for Vancomycin dosing in the setting of febrile neutropenia.   Vancomycin Peak: 33 ug/mL, subtherapeutic  Vancomycin Random: 8 ug/mL She is currently on Vancomycin '1000mg'$  IV q12 hours. Her Scr is 0.37 today.   Two-level kinetic calculations were used to formulate a new dose recommendation. Will plan to increase dose to archive a new eAUC of 448 - goal: AUC 400-550   Plan: Increase Vancomycin dose to '1250mg'$  q12 hours Continue Meropenem and Micafungin  Monitor renal function  Monitor for opportunities to de-escalate as able   Height: 5' (152.4 cm) Weight: 60 kg (132 lb 4.4 oz) IBW/kg (Calculated) : 45.5  Temp (24hrs), Avg:98 F (36.7 C), Min:97.6 F (36.4 C), Max:98.9 F (37.2 C)  Recent Labs  Lab 12/09/21 0154 12/09/21 0413 12/09/21 1200 12/10/21 0440 12/11/21 0451 12/11/21 1531 12/12/21 0226  WBC  --  1.4* 1.4* 1.3* 1.6*  --  1.5*  CREATININE  --  0.46 0.56 0.32* 0.40*  --  0.37*  VANCOTROUGH  --   --   --   --   --  27* 8*  VANCOPEAK 20*  --   --   --  33  --   --   VANCORANDOM  --   --  4  --   --   --   --      Estimated Creatinine Clearance: 56 mL/min (A) (by C-G formula based on SCr of 0.37 mg/dL (L)).    Allergies  Allergen Reactions   Victoza [Liraglutide] Nausea And Vomiting    Antimicrobials this admission: 11/8 azithro x1 11/8 cefepime>>11/12 11/9 vanc>>11/11, resume 11/12 >> 11/9 Flagyl>>11/12 11/12 Meropenem >> 11/13 Micafungin x 1, 11/15 >> 11/14 Voriconazole >> 11/15  Dose adjustments this admission: N/A  Microbiology results: All cultures negative.   Thank you for allowing pharmacy to be a part of this patient's care.   Royetta Asal, PharmD, BCPS 12/12/2021 2:46 PM

## 2021-12-12 NOTE — Progress Notes (Signed)
Nutrition Follow-up RD working remotely.   DOCUMENTATION CODES:   Non-severe (moderate) malnutrition in context of chronic illness  INTERVENTION:  - ordered Boost Breeze once/day to be mixed with Simply Thick, each supplement provides 250 kcal and 9 grams of protein  - ordered Ensure Plus High Protein once/day to be mixed with Simply Thick, each supplement provides 350 kcal and 20 grams of protein.  - ordered Simply Thick.  - ordered Magic Cup BID with meals, each supplement provides 290 kcal and 9 grams of protein  - ordered 1 tablet multivitamin with minerals/day.   NUTRITION DIAGNOSIS:   Moderate Malnutrition related to chronic illness as evidenced by energy intake < or equal to 75% for > or equal to 1 month, mild fat depletion, mild muscle depletion. -ongoing  GOAL:   Patient will meet greater than or equal to 90% of their needs -unmet  MONITOR:   PO intake, Supplement acceptance, Labs, Weight trends  REASON FOR ASSESSMENT:   Consult Assessment of nutrition requirement/status  ASSESSMENT:   Pt is a 66yo F with PMH of HTN, HLD, anemia, anxiety/depression, DM2, and breast cancer who presents with fever due to sepsis.  Patient is noted to be Spanish-speaking and require the use of an interpreter.   Patient was extubated and OGT removed the morning of 11/16. SLP following. Diet advanced from NPO to Dysphagia 3, nectar-thick liquids on 11/17 at 1302. He has been eating 25-75% at meals since that time.   Weight trending up; 122 lb on 11/17 and 132 lb today. Mild pitting edema to BUE and non-pitting edema to BLE documented in the edema section of flow sheet.   Palliative Care note from 11/18 outlines that patient is Full Code for now. Patient with dx of breast cancer s/p neoadjuvant chemo, L mastectomy, and adjuvant XRT.    Labs reviewed; CBG: 153 mmol/l, creatinine: 0.37 mg/dl, Ca: 8.5 mg/dl.  Medications reviewed; 100 mg colace BID, sliding scale novolog, 4 units  novolog TID, 20 units levemir BID, 40 mg IV protonix BID, 17 g miralax/day, 40 mEq Klor-Con x1 dose 11/19, 60 mg deltasone/day.   Diet Order:   Diet Order             DIET DYS 3 Room service appropriate? Yes; Fluid consistency: Nectar Thick  Diet effective now                   EDUCATION NEEDS:   Not appropriate for education at this time  Skin:  Skin Assessment: Reviewed RN Assessment  Last BM:  11/17 (type 7 x1)  Height:   Ht Readings from Last 1 Encounters:  12/02/21 5' (1.524 m)    Weight:   Wt Readings from Last 1 Encounters:  12/12/21 60 kg    BMI:  Body mass index is 25.83 kg/m.  Estimated Nutritional Needs:  Kcal:  1700-1900 kcal Protein:  85-100 grams Fluid:  >/= 1.8 L/day     Jarome Matin, MS, RD, LDN, CNSC Clinical Dietitian PRN/Relief staff On-call/weekend pager # available in [facility]

## 2021-12-13 ENCOUNTER — Other Ambulatory Visit: Payer: Medicare Other

## 2021-12-13 ENCOUNTER — Telehealth: Payer: Self-pay | Admitting: Nurse Practitioner

## 2021-12-13 ENCOUNTER — Ambulatory Visit: Payer: Medicare Other

## 2021-12-13 ENCOUNTER — Ambulatory Visit: Payer: Medicare Other | Admitting: Hematology

## 2021-12-13 DIAGNOSIS — R5081 Fever presenting with conditions classified elsewhere: Secondary | ICD-10-CM

## 2021-12-13 DIAGNOSIS — D709 Neutropenia, unspecified: Secondary | ICD-10-CM | POA: Diagnosis not present

## 2021-12-13 DIAGNOSIS — D61818 Other pancytopenia: Secondary | ICD-10-CM | POA: Diagnosis not present

## 2021-12-13 DIAGNOSIS — J80 Acute respiratory distress syndrome: Secondary | ICD-10-CM | POA: Diagnosis not present

## 2021-12-13 DIAGNOSIS — D46Z Other myelodysplastic syndromes: Secondary | ICD-10-CM | POA: Diagnosis not present

## 2021-12-13 LAB — DIFFERENTIAL
Abs Immature Granulocytes: 0 10*3/uL (ref 0.00–0.07)
Basophils Absolute: 0 10*3/uL (ref 0.0–0.1)
Basophils Relative: 1 %
Eosinophils Absolute: 0 10*3/uL (ref 0.0–0.5)
Eosinophils Relative: 0 %
Immature Granulocytes: 0 %
Lymphocytes Relative: 82 %
Lymphs Abs: 1.7 10*3/uL (ref 0.7–4.0)
Monocytes Absolute: 0.1 10*3/uL (ref 0.1–1.0)
Monocytes Relative: 3 %
Neutro Abs: 0.3 10*3/uL — CL (ref 1.7–7.7)
Neutrophils Relative %: 15 %
nRBC: 0 /100 WBC

## 2021-12-13 LAB — BPAM PLATELET PHERESIS
Blood Product Expiration Date: 202311182359
Blood Product Expiration Date: 202311202359
ISSUE DATE / TIME: 202311170633
ISSUE DATE / TIME: 202311191213
Unit Type and Rh: 5100
Unit Type and Rh: 5100

## 2021-12-13 LAB — BASIC METABOLIC PANEL
Anion gap: 6 (ref 5–15)
BUN: 20 mg/dL (ref 8–23)
CO2: 29 mmol/L (ref 22–32)
Calcium: 8.6 mg/dL — ABNORMAL LOW (ref 8.9–10.3)
Chloride: 103 mmol/L (ref 98–111)
Creatinine, Ser: <0.3 mg/dL — ABNORMAL LOW (ref 0.44–1.00)
Glucose, Bld: 135 mg/dL — ABNORMAL HIGH (ref 70–99)
Potassium: 3.6 mmol/L (ref 3.5–5.1)
Sodium: 138 mmol/L (ref 135–145)

## 2021-12-13 LAB — CBC
HCT: 27 % — ABNORMAL LOW (ref 36.0–46.0)
Hemoglobin: 8.9 g/dL — ABNORMAL LOW (ref 12.0–15.0)
MCH: 30.2 pg (ref 26.0–34.0)
MCHC: 33 g/dL (ref 30.0–36.0)
MCV: 91.5 fL (ref 80.0–100.0)
Platelets: 24 10*3/uL — CL (ref 150–400)
RBC: 2.95 MIL/uL — ABNORMAL LOW (ref 3.87–5.11)
RDW: 15 % (ref 11.5–15.5)
WBC: 2 10*3/uL — ABNORMAL LOW (ref 4.0–10.5)
nRBC: 0 % (ref 0.0–0.2)

## 2021-12-13 LAB — PREPARE PLATELET PHERESIS
Unit division: 0
Unit division: 0

## 2021-12-13 LAB — GLUCOSE, CAPILLARY
Glucose-Capillary: 92 mg/dL (ref 70–99)
Glucose-Capillary: 273 mg/dL — ABNORMAL HIGH (ref 70–99)
Glucose-Capillary: 279 mg/dL — ABNORMAL HIGH (ref 70–99)
Glucose-Capillary: 290 mg/dL — ABNORMAL HIGH (ref 70–99)

## 2021-12-13 LAB — HISTOPLASMA ANTIGEN, URINE: Histoplasma Antigen, urine: <0.5 (ref <0.5)

## 2021-12-13 MED ORDER — PREDNISONE 20 MG PO TABS
60.0000 mg | ORAL_TABLET | Freq: Every day | ORAL | Status: AC
  Administered 2021-12-14 – 2021-12-18 (×5): 60 mg via ORAL
  Filled 2021-12-13 (×5): qty 3

## 2021-12-13 MED ORDER — PREDNISONE 50 MG PO TABS
50.0000 mg | ORAL_TABLET | Freq: Every day | ORAL | Status: DC
  Administered 2021-12-19 – 2021-12-22 (×4): 50 mg via ORAL
  Filled 2021-12-13 (×4): qty 1

## 2021-12-13 MED ORDER — PANTOPRAZOLE SODIUM 40 MG PO TBEC
40.0000 mg | DELAYED_RELEASE_TABLET | Freq: Two times a day (BID) | ORAL | Status: DC
  Administered 2021-12-13 – 2021-12-22 (×19): 40 mg via ORAL
  Filled 2021-12-13 (×19): qty 1

## 2021-12-13 MED ORDER — POTASSIUM CHLORIDE CRYS ER 20 MEQ PO TBCR
20.0000 meq | EXTENDED_RELEASE_TABLET | Freq: Once | ORAL | Status: AC
  Administered 2021-12-13: 20 meq via ORAL
  Filled 2021-12-13: qty 1

## 2021-12-13 MED ORDER — PREDNISONE 5 MG PO TABS: 10.0000 mg | ORAL_TABLET | Freq: Every day | ORAL | Status: DC

## 2021-12-13 MED ORDER — PREDNISONE 20 MG PO TABS: 20.0000 mg | ORAL_TABLET | Freq: Every day | ORAL | Status: DC

## 2021-12-13 MED ORDER — PREDNISONE 5 MG PO TABS: 30.0000 mg | ORAL_TABLET | Freq: Every day | ORAL | Status: DC

## 2021-12-13 MED ORDER — PREDNISONE 20 MG PO TABS: 40.0000 mg | ORAL_TABLET | Freq: Every day | ORAL | Status: DC

## 2021-12-13 NOTE — Progress Notes (Addendum)
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 Was in [place] until about 2d PTA developed weakness, fatigue. Family saw her yesterday, confused, febrile, called EMS. She says she has some cough but not expectorating anything. Doesn't report any other infectious sx.   Had PLT transfusion 11/4. In ED febrile and found to have LLL/lingula pneumonia, given vanc/cefepime/azithro, 2.75L LR, started on LR infusion. Transfused 1U pRBC at 0700 11/9.  Also noted to have worsening hyponatremia na 122 (corrected 124-126). Lactic 8 ->2.2, worsening pancytopenia  She has been maintained on broad-spectrum antibiotics and has been followed by infectious disease.  PCCM called back on 11/13 due to progressive respiratory failure with severe hypoxia and tachypnea. Intubated, central access placed, vasopressors added.   Pertinent  Medical History  HTN Anemia DM2 Breast CA MDS  Significant Hospital Events: Including procedures, antibiotic start and stop dates in addition to other pertinent events   11/9 admitted, broad spectrum antibiotics 11/12 escalated to meropenem 11/13 intubated, bronch for BAL for cultures 11/15 bronch for assessment of DAH, repeat cultures 11/16 extubated 11/17 steroids weaned, 1U PLT  Interim History / Subjective:  On room air No ambulatory desaturations today, slight dyspnea reported while walking - video interpreter used> denies any current SOB or changes in sputum, having "whitish-brown" phlegm   Objective   Blood pressure (!) 153/74,  pulse 90, temperature 97.7 F (36.5 C), temperature source Oral, resp. rate 17, height 5' (1.524 m), weight 59.8 kg, SpO2 93 %.        Intake/Output Summary (Last 24 hours) at 12/13/2021 1236 Last data filed at 12/13/2021 1229 Gross per 24 hour  Intake 1858.54 ml  Output 1050 ml  Net 808.54 ml   Filed Weights   12/10/21 0400 12/12/21 0416 12/13/21 0500  Weight: 55.5 kg 60 kg 59.8 kg    Examination: General:  pleasant older Amanda sitting in bedside recliner in NAD HEENT: MM pink/moist, edentulous  Neuro:  Awake, appropriate, MAE CV: rr, no murmur PULM:  non labored, clear anteriorly, posterior bibasilar rales, no wheeze GI: soft, bs+, NT Extremities: warm/dry, no LE edema  Skin: no rashes   WBC 2, H/H 8.9/ 27, plts 24 afebrile  11/13 BAL fungal cx > pending 11/14 aspergillus > neg 11/15 BAL > ngtd 11/12 BC> neg  Resolved Hospital Problem list   Shock - unclear if septic or if SIRS related response to aspiration Hyponatremia Hypokalemia Hypophosphatemia  Assessment & Plan:   ARDS Acute hypercarbic & hypoxic respiratory failure Multilobar pneumonia vs DAH in immunocompromised host Good chance this may represent bland alveolar hemorrhage from thrombocytopenia, alternatively infectious issue but infx workup to date unrevealing - ABX per ID - continue prednisone taper, 61m for 1 week then decrease by 10 mg every week> taper placed in orders - SLP following - continue aggressive pulmonary hygiene> PT/ mobilize, IS - prn supplemental humidified O2 for sat goal > 92% - will arrange follow-up in our pulmonary office to be seen in 2-3 weeks> office will call her to set up appointment    Other issues  per primary team.  Pulmonary will sign off at this time.  Nothing further to add.  PCCM will sign off.  Please call us back if we can be of any further assistance.  Best Practice (right click and "Reselect all SmartList Selections" daily)  Diet/type: Regular consistency  (see orders)  DVT prophylaxis: SCD GI prophylaxis: PPI-high-dose while on steroids with such severe platelet deficiency. Lines: R PICC -placed 11/18 Foley:  N/A  Code Status:  full code Last date of multidisciplinary goals of care discussion: per PMT/ primary team       Kennieth Rad, MSN, AG-ACNP-BC [facility] 12/13/2021, 12:37 PM  See Amion for pager If no response to pager, please call PCCM consult pager After 7:00 pm call Elink

## 2021-12-13 NOTE — Progress Notes (Signed)
PROGRESS NOTE Amanda Duarte  KYH:[id number] DOB: Jan 22, 1956 DOA: 12/01/2021 PCP: [name], NP   Brief Narrative/Hospital Course: 74 yof w/ left breast cancer (Dx 04/2017 S/P left mastectomy and adjuvant radiation), HTN HLD T2DM and recent diagnosis of high-grade myeloid Neoplasm/myelodysplastic syndrome (Bone marrow biopsy 10/2 with confirmatory biopsy 10/11 revealing 7% blasts) thought to be caused by history of chemotherapy complicated by pancytopenia presented to [facility]/ fatigue initially found to have sepsis secondary to left lower lobe pneumonia and admitted on 12/02/21.  Despite aggressive treatment with broad-spectrum intravenous antibiotics in the days that followed patient clinically worsened with recurrent fevers and worsening shortness of breath with progressively worsening acute hypoxic respiratory failure eventually prompting PCCM involvement and broadening of antibiotic coverage under the direction of infectious disease.   Hospital course was complicated by persisting pancytopenia requiring intermittent transfusions of blood products including platelets (1 pack on 11/4, 1 pack 11/16 and 1 pack 11/17),  and packed red blood cells (11/9).  Dr. Annamaria Boots with oncology has been following.  Current targets are transfusions for platelet count of less than 15 and hemoglobin of less than 7.   Unfortunately due to progressive respiratory failure patient eventually required intubation on 11/13 for progressive respiratory failure and transfer to the intensive care unit.  A bronchoscopy was performed at that time with BAL performed for cultures.  Repeat bronchoscopy was performed on 11/15 revealing evidence pulmonary hemorrhage, thought to be secondary to recurrent severe thrombocytopenia.  Hospital course was further complicated by the development of septic shock requiring intermittent vasopressors.  Patient was managed with Neo-Synephrine which has since been weaned off.  Patient also developed episodes of  paroxysmal atrial fibrillation.  Amiodarone was initially given but later this was discontinued on 11/14 after having episodes of polymorphic ventricular tachycardia and prolonged QTc.   Patient has since been weaned off of vasopressors.  Patient has been extubated on 11/16.  Patient is currently weaned to 5 L of oxygen via nasal cannula.  11/18: Transferred back to [facility] service.  Worked with PT 11/17 advised home health PT with RW 11/18> left IJ removed and RUE PICC line placed    Subjective: Seen and examined this morning working with PT weaned off to room air this morning, daughter at the bedside translator on the video Complains of legs being weak.  No other new complaints. Overnight afebrile  Labs this morning stable BMP WBC platelet hemoglobin uptrending slightly   Assessment and Plan: ARDS Acute hypoxic and hypercapnic respiratory failure Multilobar pneumonia versus DAH immunocompromised setting Septic shock-Resolved: Initially in the ICU needing intubation, vasopressor support> clinically improved, on room air this morning. blood culture 11/12> NGTD, BAL culture 11/15 NGTD.  Remains on vancomycin, meropenem, micafungin with PICC line > ID managing.  Encourage PT OT.  PAF/polymorphic VT with prolonged Qtc:off amio. On monitor. Hypertension: BP controlled on home losartan and prn meds HLD cont lipitor   Pancytopenia MDS Previous breast cancer history: [name] following.  S/p 2 Units PRBC 11/9 and 11/3. S/p multiple platelet transfusions (on  11/17, 11/16, 12/15, 11/13 x 2 and 11/19). On  Solu-Medrol> changed to 60 mg daily 11/18> to 60 mg po from 11/19->Taper as per oncology.Her counts are improving, continue to monitor daily and transfusion support w/ goal of Hb > 7 and PLT > 15K Recent Labs  Lab 12/11/21 0451 12/12/21 0226 12/13/21 0253  HGB 9.1* 8.8* 8.9*  HCT 27.9* 27.1* 27.0*  WBC 1.6* 1.5* 2.0*  PLT 22* 13* 24*  T2DM with uncontrolled hyperglycemia: at home on Amaryl  4 mg, Lantus 25 bedtime Janumet, Humalog 4 units Premeal .  Blood sugar fluctuating, 90s to 300 > in the setting of steroid use.continue with 20u Levemir bid, SSI 0-20 units,4 u premeal novolog. Of note on steroid here so monitor closely. Recent Labs  Lab 12/12/21 0734 12/12/21 1145 12/12/21 1630 12/12/21 2029 12/13/21 0722  GLUCAP 153* 313* 314* 331* 92     Hypophosphatemia Hypokalemia: Monitor and replete. Recent Labs  Lab 12/07/21 0245 12/07/21 1112 12/08/21 0509 12/09/21 0413 12/09/21 1200 12/10/21 0440 12/11/21 0451 12/12/21 0226 12/13/21 0253  K 4.3 3.8 3.5 5.7* 4.5 3.4* 3.6 3.4* 3.6  CALCIUM 8.1* 7.7* 8.2* 8.5* 8.8* 8.7* 8.6* 8.5* 8.6*  MG 2.0 2.0 2.7* 2.7*  --  2.1  --   --   --   PHOS 6.0*  --  2.3* 3.9  --  3.5  --   --   --    Moderate malnutrition augment diet with supplement, dietitian following as below. Etiology: chronic illness Signs/Symptoms: energy intake < or equal to 75% for > or equal to 1 month, mild fat depletion, mild muscle depletion Interventions: Ensure Enlive (each supplement provides 350kcal and 20 grams of protein), Magic cup, Boost Breeze, MVI  Goals of care currently full code.  Seen by palliative care.  Overall prognosis not bright at least in the long run.   DVT prophylaxis: SCDs Start: 12/02/21 1022 Code Status:   Code Status: Full Code Family Communication: plan of care discussed with patient and her daughter at bedside.  Patient status is: Patient because of pancytopenia, respiratory failure Level of care: Progressive  Dispo: The patient is from: home            Anticipated disposition: home w/ daughter w/ PTOT. Mobility Assessment (last 72 hours)     Mobility Assessment     Row Name 12/12/21 1455 12/11/21 2039 12/11/21 0812 12/11/21 0500 12/10/21 1700   Does patient have an order for bedrest or is patient medically unstable No - Continue assessment No - Continue assessment Yes- Bedfast (Level 1) - Complete Yes- Bedfast (Level 1)  - Complete --   What is the highest level of mobility based on the progressive mobility assessment? Level 3 (Stands with assist) - Balance while standing  and cannot march in place -- Level 3 (Stands with assist) - Balance while standing  and cannot march in place Level 3 (Stands with assist) - Balance while standing  and cannot march in place Level 3 (Stands with assist) - Balance while standing  and cannot march in place   Is the above level different from baseline mobility prior to current illness? Yes - Recommend PT order -- Yes - Recommend PT order -- --    [place] Name 12/10/21 1500           What is the highest level of mobility based on the progressive mobility assessment? Level 3 (Stands with assist) - Balance while standing  and cannot march in place                 Objective: Vitals last 24 hrs: Vitals:   12/12/21 1410 12/12/21 2027 12/13/21 0500 12/13/21 0620  BP: (!) 153/73 (!) 152/87  (!) 142/74  Pulse: 77 81  75  Resp: _0 Temp: 98.1 F (36.7 C) (!) 97.5 F (36.4 C)  97.6 F (36.4 C)  TempSrc: Oral Oral  Oral  SpO2: 99% 98%  99%  Weight:   59.8 kg   Height:       Weight change: -0.2 kg  Physical Examination: General exam: AAox3, weak,older appearing HEENT:Oral mucosa moist, Ear/Nose WNL grossly, dentition normal. Respiratory system: bilaterally clear BS,no use of accessory muscle Cardiovascular system: S1 & S2 +, regular rate. Gastrointestinal system: Abdomen soft,NT,ND,BS+ Nervous System:Alert, awake, moving extremities and grossly nonfocal Extremities: LE ankle edema neg, lower extremities warm Skin: No rashes,no icterus. MSK: Normal muscle bulk,tone, power   Medications reviewed:  Scheduled Meds:  sodium chloride   Intravenous Once   sodium chloride   Intravenous Once   atorvastatin  40 mg Oral Daily   Chlorhexidine Gluconate Cloth  6 each Topical Daily   docusate  100 mg Per Tube BID   feeding supplement  1 Container Oral Q24H   feeding  supplement  237 mL Oral Q24H   insulin aspart  0-20 Units Subcutaneous TID WC   insulin aspart  0-5 Units Subcutaneous QHS   insulin aspart  4 Units Subcutaneous TID WC   insulin detemir  20 Units Subcutaneous Q12H   losartan  25 mg Oral Daily   multivitamin with minerals  1 tablet Oral Daily   pantoprazole  40 mg Oral BID   polyethylene glycol  17 g Per Tube Daily   predniSONE  60 mg Oral Q breakfast   sodium chloride flush  10-40 mL Intracatheter Q12H   sulfamethoxazole-trimethoprim  1 tablet Oral Once per day on Mon Wed Fri   Thrombi-Pad  1 each Topical Once   Continuous Infusions:  sodium chloride Stopped (12/07/21 1409)   meropenem (MERREM) IV 1 g (12/13/21 0620)   micafungin (MYCAMINE) 100 mg in sodium chloride 0.9 % 100 mL IVPB 100 mg (12/13/21 0929)   vancomycin 1,250 mg (12/13/21 0322)      Diet Order             DIET DYS 3 Room service appropriate? Yes; Fluid consistency: Nectar Thick  Diet effective now                    Intake/Output Summary (Last 24 hours) at 12/13/2021 1010 Last data filed at 12/13/2021 3833 Gross per 24 hour  Intake 1515.5 ml  Output 1050 ml  Net 465.5 ml   Net IO Since Admission: 10,560.88 mL [12/13/21 1010]  Wt Readings from Last 3 Encounters:  12/13/21 59.8 kg  11/23/21 51.5 kg  11/17/21 51.7 kg     Unresulted Labs (From admission, onward)     Start     Ordered   12/13/21 0735  CBC with Differential/Platelet  Add-on,   AD       Question:  Specimen collection method  Answer:  IV Team=IV Team collect   12/13/21 0734   12/13/21 0253  Differential  Once,   R        12/13/21 0253   12/06/21 1822  Acid Fast Culture with reflexed sensitivities  (AFB smear + Culture w reflexed sensitivities panel)  Once,   R       See Hyperspace for full Linked Orders Report.   12/06/21 1821   12/06/21 1336  Histoplasma antigen, urine  Once,   R        12/06/21 1335   12/05/21 0000  Type and screen  R       Comments: [place]    12/05/21 2206          Data Reviewed: I have personally  reviewed following labs and imaging studies CBC: Recent Labs  Lab 12/07/21 0245 12/08/21 0509 12/09/21 1200 12/10/21 0440 12/11/21 0451 12/12/21 0226 12/13/21 0253  WBC 1.6*   < > 1.4* 1.3* 1.6* 1.5* 2.0*  NEUTROABS 0.5*  --  0.4*  --   --   --   --   HGB 8.7*   < > 7.7* 7.6* 9.1* 8.8* 8.9*  HCT 26.8*   < > 23.8* 23.4* 27.9* 27.1* 27.0*  MCV 93.4   < > 94.4 92.5 93.0 91.9 91.5  PLT 34*   < > 25* 11* 22* 13* 24*   < > = values in this interval not displayed.   Basic Metabolic Panel: Recent Labs  Lab 12/07/21 0245 12/07/21 1112 12/08/21 0509 12/09/21 0413 12/09/21 1200 12/10/21 0440 12/11/21 0451 12/12/21 0226 12/13/21 0253  NA 135 138 141 145 146* 151* 145 140 138  K 4.3 3.8 3.5 5.7* 4.5 3.4* 3.6 3.4* 3.6  CL 102 105 110 115* 114* 116* 110 103 103  CO2 18* _0 GLUCOSE 193* 324* 363* 185* 224* 175* 354* 172* 135*  BUN 28* 42* 56* 60* 59* 42* 30* 23 20  CREATININE 0.87 0.81 0.62 0.46 0.56 0.32* 0.40* 0.37* <0.30*  CALCIUM 8.1* 7.7* 8.2* 8.5* 8.8* 8.7* 8.6* 8.5* 8.6*  MG 2.0 2.0 2.7* 2.7*  --  2.1  --   --   --   PHOS 6.0*  --  2.3* 3.9  --  3.5  --   --   --    Recent Results (from the past 240 hour(s))  Culture, blood (Routine X 2) w Reflex to ID Panel     Status: None   Collection Time: 12/05/21 11:45 AM   Specimen: BLOOD  Result Value Ref Range Status   Specimen Description   Final    BLOOD SITE NOT SPECIFIED Performed at [facility], [place] [address] [place], [place] [address]    Special Requests   Final    BOTTLES DRAWN AEROBIC AND ANAEROBIC Blood Culture adequate volume Performed at [place] [address] [place], [place] [address]    Culture   Final    NO GROWTH 5 DAYS Performed at [facility], [place] [address] [place], [place] [address]    Report Status 12/10/2021 FINAL  Final  Culture, blood (Routine X 2)  w Reflex to ID Panel     Status: None   Collection Time: 12/05/21 11:45 AM   Specimen: BLOOD  Result Value Ref Range Status   Specimen Description   Final    BLOOD SITE NOT SPECIFIED Performed at [place] [address], [place], [place] [address]    Special Requests   Final    BOTTLES DRAWN AEROBIC AND ANAEROBIC Blood Culture adequate volume Performed at [place] [address] [place], [place] [address]    Culture   Final    NO GROWTH 5 DAYS Performed at [facility], [place] [address], [place], [place] [address]    Report Status 12/10/2021 FINAL  Final  Expectorated Sputum Assessment w Gram Stain, Rflx to Resp Cult     Status: None   Collection Time: 12/05/21  6:35 PM   Specimen: Sputum  Result Value Ref Range Status   Specimen Description   Final    SPU Performed at [facility] [address], [place], [place] [address]  Special Requests   Final    NONE Performed at [facility], [place] [address] [place], [place] [address]    Sputum evaluation THIS SPECIMEN IS ACCEPTABLE FOR SPUTUM CULTURE  Final   Report Status 12/05/2021 FINAL  Final  Culture, Respiratory w Gram Stain     Status: None   Collection Time: 12/05/21  6:35 PM   Specimen: Sputum  Result Value Ref Range Status   Specimen Description   Final    SPU Performed at [facility] [address], [place], [place] [address]    Special Requests   Final    NONE Reflexed from [id number] Performed at [facility], [place] [address], [place], [place] [address]    Gram Stain   Final    FEW WBC PRESENT, PREDOMINANTLY MONONUCLEAR FEW SQUAMOUS EPITHELIAL CELLS PRESENT FEW GRAM VARIABLE ROD FEW BUDDING YEAST SEEN    Culture   Final    FEW Normal respiratory flora-no Staph aureus or Pseudomonas seen Performed at [facility], [address] [place], [place] [address]    Report Status  12/08/2021 FINAL  Final  Culture, Respiratory w Gram Stain     Status: None   Collection Time: 12/06/21  6:15 PM   Specimen: Bronchoalveolar Lavage; Respiratory  Result Value Ref Range Status   Specimen Description   Final    BRONCHIAL ALVEOLAR LAVAGE Performed at [place] [address], [place], [place] [address]    Special Requests   Final    NONE Performed at [facility], [place] [address] [place], [place] [address]    Gram Stain   Final    FEW WBC PRESENT, PREDOMINANTLY MONONUCLEAR NO ORGANISMS SEEN    Culture   Final    NO GROWTH 2 DAYS Performed at [place] [address], [place], [place] [address]    Report Status 12/09/2021 FINAL  Final  Fungus Culture With Stain     Status: None (Preliminary result)   Collection Time: 12/06/21  6:15 PM   Specimen: Bronchial Alveolar Lavage  Result Value Ref Range Status   Fungus Stain Final report  Final    Comment: (NOTE) Performed At: [facility] [place], [place] [id number] Rush Farmer MD [id number]    Fungus (Mycology) Culture PENDING  Incomplete   Fungal Source BRONCHIAL ALVEOLAR LAVAGE  Final    Comment: Performed at [facility], [place] [address], [place], [place] [address]  Acid Fast Smear (AFB)     Status: None   Collection Time: 12/06/21  6:15 PM   Specimen: Bronchoalveolar Lavage; Respiratory  Result Value Ref Range Status   AFB Specimen Processing Concentration  Final   Acid Fast Smear Negative  Final    Comment: (NOTE) Performed At: [facility] [place], [place] [id number] Rush Farmer MD [id number]    Source (AFB) BRONCHIAL ALVEOLAR LAVAGE  Final    Comment: Performed at [place] [address] [place], [place] [address]  Fungus Culture Result     Status: None   Collection Time: 12/06/21  6:15 PM  Result Value Ref Range Status   Result 1 Comment  Final     Comment: (NOTE) KOH/Calcofluor preparation:  no fungus observed. Performed At: [facility] [address] [place], [place] [id number] Rush Farmer MD [id number]   Aspergillus Ag, BAL/Serum     Status: None   Collection Time: 12/07/21  8:36 AM   Specimen: Bronchial Alveolar Lavage; Respiratory  Result Value Ref Range Status   Aspergillus Ag, BAL/Serum 0.07 0.00 - 0.49 Index Final    Comment: (NOTE) Performed At: [facility] [place], [place] [id number] Rush Farmer MD [id number]   Culture, Respiratory w Gram Stain     Status: None   Collection Time: 12/08/21 11:32 AM   Specimen: Bronchoalveolar Lavage; Respiratory  Result Value Ref Range Status   Specimen Description   Final    BRONCHIAL ALVEOLAR LAVAGE Performed at [place] [address] [place], [place] [address]    Special Requests   Final    NONE Performed at [facility], [place] [address] [place], [place] [address]    Gram Stain NO WBC SEEN NO ORGANISMS SEEN   Final   Culture   Final    NO GROWTH 2 DAYS Performed at [facility], [place] [address] [place], [place] [address]    Report Status 12/10/2021 FINAL  Final  Pneumocystis smear by DFA     Status: None   Collection Time: 12/08/21 11:32 AM   Specimen: Bronchoalveolar Lavage; Respiratory  Result Value Ref Range Status   Specimen Source-PJSRC BRONCHIAL ALVEOLAR LAVAGE  Final   Pneumocystis jiroveci Ag NEGATIVE  Final    Comment: Performed at [place] Performed at [place] [address], [place], [place] [address]     Antimicrobials: Anti-infectives (From admission, onward)    Start     Dose/Rate Route Frequency Ordered Stop   12/13/21 1000  sulfamethoxazole-trimethoprim (BACTRIM DS) 800-160 MG per tablet 1 tablet        1 tablet Oral Once per day on Mon Wed Fri 12/11/21 1227     12/12/21 1500  vancomycin (VANCOREADY) IVPB 1250  mg/250 mL        1,250 mg 166.7 mL/hr over 90 Minutes Intravenous Every 12 hours 12/12/21 1308     12/09/21 1400  vancomycin (VANCOCIN) IVPB 1000 mg/200 mL premix  Status:  Discontinued        1,000 mg 200 mL/hr over 60 Minutes Intravenous Every 12 hours 12/09/21 1351 12/12/21 1308   12/08/21 1000  voriconazole (VFEND) 200 mg in sodium chloride 0.9 % 100 mL IVPB  Status:  Discontinued       See Hyperspace for full Linked Orders Report.   200 mg 60 mL/hr over 120 Minutes Intravenous Every 12 hours 12/07/21 0841 12/08/21 0836   12/08/21 1000  micafungin (MYCAMINE) 100 mg in sodium chloride 0.9 % 100 mL IVPB        100 mg 105 mL/hr over 1 Hours Intravenous Every 24 hours 12/08/21 0836     12/07/21 2330  vancomycin (VANCOREADY) IVPB 750 mg/150 mL  Status:  Discontinued        750 mg 150 mL/hr over 60 Minutes Intravenous Every 24 hours 12/07/21 0000 12/09/21 1351   12/07/21 1000  voriconazole (VFEND) 300 mg in sodium chloride 0.9 % 100 mL IVPB       See Hyperspace for full Linked Orders Report.   300 mg 65 mL/hr over 120 Minutes Intravenous Every 12 hours 12/07/21 0841 12/08/21 0007   12/07/21 0630  meropenem (MERREM) 1 g in sodium chloride 0.9 % 100 mL IVPB        1 g 200 mL/hr over 30 Minutes Intravenous Every 8 hours 12/06/21 2202     12/06/21 2000  micafungin (MYCAMINE) 100  mg in sodium chloride 0.9 % 100 mL IVPB  Status:  Discontinued        100 mg 105 mL/hr over 1 Hours Intravenous Every 24 hours 12/06/21 1838 12/07/21 0841   12/05/21 1630  meropenem (MERREM) 1 g in sodium chloride 0.9 % 100 mL IVPB  Status:  Discontinued        1 g 200 mL/hr over 30 Minutes Intravenous Every 12 hours 12/05/21 1521 12/05/21 1549   12/05/21 1630  vancomycin (VANCOREADY) IVPB 750 mg/150 mL  Status:  Discontinued        750 mg 150 mL/hr over 60 Minutes Intravenous Every 24 hours 12/05/21 1521 12/07/21 0000   12/05/21 1630  meropenem (MERREM) 1 g in sodium chloride 0.9 % 100 mL IVPB  Status:   Discontinued        1 g 200 mL/hr over 30 Minutes Intravenous Every 8 hours 12/05/21 1549 12/06/21 2202   12/03/21 0800  vancomycin (VANCOREADY) IVPB 750 mg/150 mL  Status:  Discontinued        750 mg 150 mL/hr over 60 Minutes Intravenous Every 24 hours 12/02/21 0923 12/04/21 1041   12/02/21 1045  metroNIDAZOLE (FLAGYL) IVPB 500 mg  Status:  Discontinued        500 mg 100 mL/hr over 60 Minutes Intravenous Every 12 hours 12/02/21 1021 12/05/21 1515   12/02/21 1000  ceFEPIme (MAXIPIME) 2 g in sodium chloride 0.9 % 100 mL IVPB  Status:  Discontinued        2 g 200 mL/hr over 30 Minutes Intravenous Every 12 hours 12/02/21 0923 12/05/21 1515   12/02/21 0800  vancomycin (VANCOCIN) IVPB 1000 mg/200 mL premix        1,000 mg 200 mL/hr over 60 Minutes Intravenous  Once 12/02/21 0754 12/02/21 0913   12/01/21 2330  ceFEPIme (MAXIPIME) 2 g in sodium chloride 0.9 % 100 mL IVPB        2 g 200 mL/hr over 30 Minutes Intravenous  Once 12/01/21 2315 12/01/21 2352   12/01/21 2330  azithromycin (ZITHROMAX) 500 mg in sodium chloride 0.9 % 250 mL IVPB        500 mg 250 mL/hr over 60 Minutes Intravenous  Once 12/01/21 2315 12/02/21 0026      Culture/Microbiology    Component Value Date/Time   SDES  12/08/2021 1132    BRONCHIAL ALVEOLAR LAVAGE Performed at [facility], [place] [address], [place], [place] [address]    SPECREQUEST  12/08/2021 1132    NONE Performed at [facility], [place] [address] [place], [place] [address]    CULT  12/08/2021 1132    NO GROWTH 2 DAYS Performed at [place] [address] [place], [place] [address]    REPTSTATUS 12/10/2021 FINAL 12/08/2021 1132  Radiology Studies: [name] EKG SITE RITE  Result Date: 12/11/2021 If Site Rite image not attached, placement could not be confirmed due to current cardiac rhythm.    LOS: 11 days   Antonieta Pert, MD [facility]  12/13/2021, 10:10 AM
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
## 2021-12-13 NOTE — Inpatient Diabetes Management (Signed)
Inpatient Diabetes Program Recommendations  AACE/ADA: New Consensus Statement on Inpatient Glycemic Control (2015)  Target Ranges:  Prepandial:   less than 140 mg/dL      Peak postprandial:   less than 180 mg/dL (1-2 hours)      Critically ill patients:  140 - 180 mg/dL   Lab Results  Component Value Date   GLUCAP 92 12/13/2021   HGBA1C 11.4 (A) 10/28/2021    Review of Glycemic Control  Latest Reference Range & Units 12/12/21 11:45 12/12/21 16:30 12/12/21 20:29 12/13/21 07:22  Glucose-Capillary 70 - 99 mg/dL 313 (H) 314 (H) 331 (H) 92  (H): Data is abnormally high Diabetes history: Type 2 DM Outpatient Diabetes medications: Janumet 50-1000 mg BID, Lantus 25 units BID, Humalog 4 units TID (NT), Amaryl 4 mg BID Current orders for Inpatient glycemic control: Levemir 20 units BID, Novolog 4 units TID, Novolog 0-20 units TID & HS Prednisone 60 mg QD   Inpatient Diabetes Program Recommendations:    In the setting of steroids and poor oral intake could consider increasing AM dose of Levemir to 28 units QD.  Thanks, Bronson Curb, MSN, RNC-OB Diabetes Coordinator [phone] (8a-5p)

## 2021-12-13 NOTE — Progress Notes (Signed)
Physical Therapy Treatment Patient Details Name: Amanda Duarte MRN: [id number] DOB: Jun 25, 1955 Today's Date: 12/13/2021   History of Present Illness Pt is a 66 year old woman admitted on 11/9 with fever, weakness, hyperglycemia and AMS. 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 Pt with increased hypoxia on 11/13, intubated 11/13- 11/16 with multilobar PNA. PMH: breast cancer s/p L mastectomy/chemo/radiation, HTN, HLD, anemia, DM2.    PT Comments    Interpreter and daughter used to translate during session. Pt denied any pain at rest but has 3/10 lateral /superior breast pain while coughing. Pt was found on 3L of oxygen. Required mod assist for bed mobility to bring trunk upright, guide LE's off of bed, and use of bed pain to scoot to EOB; guided trunk and assisted LEs back into bed; min assist for transfers to rise and steady from bed; mod assist needed to rise and steady from [facility] and during step pivot to [facility]. 2 attempts needed to stand from [facility]. Cues needed for correct hand placement. Pt quick to sit down before instructions were translated; Required min assist to ambulate 20 ft with one seated rest break due to signs of fatigue (dyspnea 1/4). Recliner follow needed for safety. Left patient on RA and notified nurse. Patient plans on returning home with family support.  Room Air at Rest = 95%     HR: 98   Room Air while Ambulating 20 ft= 92%   HR: 121; dyspnea 1/4      Recommendations for follow up therapy are one component of a multi-disciplinary discharge planning process, led by the attending physician.  Recommendations may be updated based on patient status, additional functional criteria and insurance authorization.  Follow Up Recommendations  Home health PT     Assistance Recommended at Discharge Frequent or constant Supervision/Assistance  Patient can return home with the following A lot of help with walking and/or transfers;A little help with bathing/dressing/bathroom;Assistance with  cooking/housework;Assist for transportation;Help with stairs or ramp for entrance   Equipment Recommendations  Rolling walker (2 wheels);Other (comment) (youth RW)    Recommendations for Other Services       Precautions / Restrictions Precautions Precautions: Fall Restrictions Weight Bearing Restrictions: No     Mobility  Bed Mobility Overal bed mobility: Needs Assistance Bed Mobility: Supine to Sit, Sit to Supine     Supine to sit: Mod assist, HOB elevated Sit to supine: Mod assist, HOB elevated   General bed mobility comments: assist to bring trunk upright, guide LE's off of bed, and use of bed pain to scoot to EOB; guided trunk and assisted LEs back into bed.    Transfers Overall transfer level: Needs assistance Equipment used: 1 person hand held assist, Rolling walker (2 wheels) Transfers: Sit to/from Stand Sit to Stand: Mod assist, +2 safety/equipment, +2 physical assistance, Min assist           General transfer comment: min assist to rise and steady from bed; mod assist needed to rise and steady from [facility]. 2 attempts needed to stand from [facility]. Cues needed for correct hand placement. Pt quick to sit down before instructions were translated.    Ambulation/Gait Ambulation/Gait assistance: +2 safety/equipment, Min assist Gait Distance (Feet): 20 Feet Assistive device: Rolling walker (2 wheels) Gait Pattern/deviations: Step-to pattern, Decreased step length - left, Decreased step length - right, Shuffle, Trunk flexed Gait velocity: decr     General Gait Details: Pt required min assist to ambulate 20 ft with one seated rest break due to signs  of fatigue (dyspnea 1/4). Recliner follow needed for safety.   Stairs             Wheelchair Mobility    Modified Rankin (Stroke Patients Only)       Balance                                            Cognition Arousal/Alertness: Awake/alert Behavior During Therapy: WFL for tasks  assessed/performed Overall Cognitive Status: Within Functional Limits for tasks assessed                                 General Comments: WFL for tasks assessed, interpreter as well as daughter translated to pt, pt appears to have poor insight into the gravity of her illness and need for care when she returns home        Exercises      General Comments        Pertinent Vitals/Pain Pain Assessment Pain Assessment: No/denies pain Pain Intervention(s): Monitored during session    Home Living                          Prior Function            PT Goals (current goals can now be found in the care plan section) Acute Rehab PT Goals Patient Stated Goal: regain IND Time For Goal Achievement: 12/23/21 Potential to Achieve Goals: Good Progress towards PT goals: Progressing toward goals    Frequency    Min 3X/week      PT Plan      Co-evaluation              AM-PAC PT "6 Clicks" Mobility   Outcome Measure  Help needed turning from your back to your side while in a flat bed without using bedrails?: A Lot Help needed moving from lying on your back to sitting on the side of a flat bed without using bedrails?: A Lot Help needed moving to and from a bed to a chair (including a wheelchair)?: A Lot Help needed standing up from a chair using your arms (e.g., wheelchair or bedside chair)?: A Lot Help needed to walk in hospital room?: A Little Help needed climbing 3-5 steps with a railing? : Total 6 Click Score: 12    End of Session Equipment Utilized During Treatment: Gait belt Activity Tolerance: Patient limited by fatigue Patient left: with call bell/phone within reach;in chair;with chair alarm set;with family/visitor present Nurse Communication: Mobility status PT Visit Diagnosis: Unsteadiness on feet (R26.81);Muscle weakness (generalized) (M62.81);Difficulty in walking, not elsewhere classified (R26.2)     Time: 4098-1191 PT Time  Calculation (min) (ACUTE ONLY): 45 min  Charges:  $Gait Training: 8-22 mins $Therapeutic Activity: 23-37 mins                        Delrico Minehart 12/13/2021, 1:52 PM

## 2021-12-13 NOTE — Progress Notes (Signed)
Occupational Therapy Treatment Patient Details Name: Amanda Duarte MRN: [id number] DOB: Jun 17, 1955 Today's Date: 12/13/2021   History of present illness Pt is a 66 year old woman admitted on 11/9 with fever, weakness, hyperglycemia and AMS. 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 Pt with increased hypoxia on 11/13, intubated 11/13- 11/16 with multilobar PNA. PMH: breast cancer s/p L mastectomy/chemo/radiation, HTN, HLD, anemia, DM2.   OT comments  Patient was able to participate in toileting tasks with improvement from last OT session notes. Patient noted to have LOB with TD to transition to [facility] with RW with need for translation impacting cues during session. Patient was noted to have decreased functional activity tolerance, decreased endurance, decreased standing balance, decreased safety awareness, and decreased knowledge of AD/AE impacting participation in ADLs. Patient would continue to benefit from skilled OT services at this time while admitted and after d/c to address noted deficits in order to improve overall safety and independence in ADLs.     Recommendations for follow up therapy are one component of a multi-disciplinary discharge planning process, led by the attending physician.  Recommendations may be updated based on patient status, additional functional criteria and insurance authorization.    Follow Up Recommendations  Home health OT     Assistance Recommended at Discharge Frequent or constant Supervision/Assistance  Patient can return home with the following  A lot of help with walking and/or transfers;A lot of help with bathing/dressing/bathroom;Assistance with cooking/housework;Direct supervision/assist for financial management;Assist for transportation;Help with stairs or ramp for entrance;Direct supervision/assist for medications management   Equipment Recommendations  Tub/shower seat    Recommendations for Other Services      Precautions / Restrictions  Precautions Precautions: Fall Restrictions Weight Bearing Restrictions: No       Mobility Bed Mobility Overal bed mobility: Needs Assistance Bed Mobility: Sit to Supine       Sit to supine: Mod assist   General bed mobility comments: with increased time and physical assist for trunk positioning and to get BLE back into bed.    Transfers                         Balance Overall balance assessment: Needs assistance Sitting-balance support: Feet unsupported, Single extremity supported Sitting balance-Leahy Scale: Fair     Standing balance support: Bilateral upper extremity supported Standing balance-Leahy Scale: Poor                             ADL either performed or assessed with clinical judgement   ADL Overall ADL's : Needs assistance/impaired                         Toilet Transfer: Moderate assistance;Ambulation;Rolling walker (2 wheels);BSC/3in1 Toilet Transfer Details (indicate cue type and reason): with increased time and TD to complete transfer with patient attempting to sit down prior to reaching [facility]. noted to have lag in cues with translator impacting session. Toileting- Clothing Manipulation and Hygiene: Minimal assistance;Sitting/lateral lean;Sit to/from stand Toileting - Clothing Manipulation Details (indicate cue type and reason): patient was able to complete hygiene tasks seated in bsc with min A to complete task after loose stool            Extremity/Trunk Assessment              Vision       Perception     Praxis  Cognition Arousal/Alertness: Awake/alert Behavior During Therapy: WFL for tasks assessed/performed Overall Cognitive Status: Difficult to assess                                          Exercises      Shoulder Instructions       General Comments      Pertinent Vitals/ Pain       Pain Assessment Pain Assessment: No/denies pain  Home Living                                           Prior Functioning/Environment              Frequency  Min 2X/week        Progress Toward Goals  OT Goals(current goals can now be found in the care plan section)  Progress towards OT goals: Progressing toward goals     Plan Discharge plan remains appropriate    Co-evaluation                 AM-PAC OT "6 Clicks" Daily Activity     Outcome Measure   Help from another person eating meals?: A Little Help from another person taking care of personal grooming?: A Little Help from another person toileting, which includes using toliet, bedpan, or urinal?: A Lot Help from another person bathing (including washing, rinsing, drying)?: A Lot Help from another person to put on and taking off regular upper body clothing?: A Little Help from another person to put on and taking off regular lower body clothing?: A Lot 6 Click Score: 15    End of Session Equipment Utilized During Treatment: Gait belt;Rolling walker (2 wheels)  OT Visit Diagnosis: Unsteadiness on feet (R26.81);Other abnormalities of gait and mobility (R26.89);Muscle weakness (generalized) (M62.81)   Activity Tolerance Patient tolerated treatment well   Patient Left in bed;with call bell/phone within reach;with bed alarm set   Nurse Communication Mobility status        Time: 2671-2458 OT Time Calculation (min): 21 min  Charges: OT General Charges $OT Visit: 1 Visit OT Treatments $Self Care/Home Management : 8-22 mins  Rennie Plowman, MS Acute Rehabilitation Department Office# [phone]   Marcellina Millin 12/13/2021, 4:03 PM

## 2021-12-13 NOTE — TOC Transition Note (Signed)
Transition of Care [facility]) - CM/SW Discharge Note   Patient Details  Name: Amanda Duarte MRN: [id number] Date of Birth: 1955/10/09  [facility] CM/SW Contact:  Dessa Phi, RN Phone Number: 12/13/2021, 11:21 AM   Clinical Narrative:d/c home w/HHC-[employer] rep Tommi Rumps aware-HHPT/OT;dme [employer] to [place] to rm prior d/c Youth rolling walker, & tub bench. Family has own transport home. No further CM needs.       Final next level of care: Home w Home Health Services Barriers to Discharge: No Barriers Identified   Patient Goals and CMS Choice   CMS Medicare.gov Compare Post Acute Care list provided to:: Patient Represenative (must comment) (Dtr) Choice offered to / list presented to : Adult Children  Discharge Placement                       Discharge Plan and Services   Discharge Planning Services: CM Consult Post Acute Care Choice: Home Health, Durable Medical Equipment          DME Arranged: Walker youth, Tub bench DME Agency: [employer] Date DME Agency Contacted: 12/13/21 Time DME Agency Contacted: 1120 Representative spoke with at DME Agency: Erasmo Downer [place]: PT, OT [place] Agency: [place] Date [place]: 12/13/21 Time [place]: 1120 Representative spoke with at [place]: [place] ([place]) Interventions     Readmission Risk Interventions    12/02/2021    2:10 PM  Readmission Risk Prevention Plan  Transportation Screening Complete  PCP or Specialist Appt within 5-7 Days Complete  Home Care Screening Complete  Medication Review (RN CM) Complete

## 2021-12-13 NOTE — Progress Notes (Signed)
SATURATION QUALIFICATIONS: (This note is used to comply with regulatory documentation for home oxygen)  Patient Saturations on Room Air at Rest = 95%  Patient Saturations on Room Air while Ambulating 20 ft= 92%   HR: 121; dyspnea 1/4  Please briefly explain why patient needs home oxygen: Patient did not require supplemental oxygen during activity.

## 2021-12-13 NOTE — Progress Notes (Signed)
Amanda Duarte   DOB:1955-03-05   AQ#:[id number]   KTG#:[id number]  Hem/onc follow up   Subjective: Patient was transferred out of ICU, doing better. She is tolerating oral intake well, and has worked with physical therapist, occupational therapist and speech therapist.  She is hemodynamically stable.   Objective:  Vitals:   12/13/21 0620 12/13/21 1157  BP: (!) 142/74 (!) 153/74  Pulse: 75 90  Resp: 20 17  Temp: 97.6 F (36.4 C) 97.7 F (36.5 C)  SpO2: 99% 93%    Body mass index is 25.75 kg/m.  Intake/Output Summary (Last 24 hours) at 12/13/2021 1940 Last data filed at 12/13/2021 1800 Gross per 24 hour  Intake 1525.15 ml  Output 250 ml  Net 1275.15 ml     Sclerae unicteric  Alert and oriented No leg edema       CBG (last 3)  Recent Labs    12/13/21 0722 12/13/21 1154 12/13/21 1652  GLUCAP 92 273* 279*     Labs:  Urine Studies No results for input(s): "UHGB", "CRYS" in the last 72 hours.  Invalid input(s): "UACOL", "UAPR", "USPG", "UPH", "UTP", "UGL", "UKET", "UBIL", "UNIT", "UROB", "ULEU", "UEPI", "UWBC", "URBC", "UBAC", "CAST", "UCOM", "BILUA"  Basic Metabolic Panel: Recent Labs  Lab 12/07/21 0245 12/07/21 1112 12/08/21 0509 12/09/21 0413 12/09/21 1200 12/10/21 0440 12/11/21 0451 12/12/21 0226 12/13/21 0253  NA 135 138 141 145 146* 151* 145 140 138  K 4.3 3.8 3.5 5.7* 4.5 3.4* 3.6 3.4* 3.6  CL 102 105 110 115* 114* 116* 110 103 103  CO2 18* _0 GLUCOSE 193* 324* 363* 185* 224* 175* 354* 172* 135*  BUN 28* 42* 56* 60* 59* 42* 30* 23 20  CREATININE 0.87 0.81 0.62 0.46 0.56 0.32* 0.40* 0.37* <0.30*  CALCIUM 8.1* 7.7* 8.2* 8.5* 8.8* 8.7* 8.6* 8.5* 8.6*  MG 2.0 2.0 2.7* 2.7*  --  2.1  --   --   --   PHOS 6.0*  --  2.3* 3.9  --  3.5  --   --   --    GFR CrCl cannot be calculated (This lab value cannot be used to calculate CrCl because it is not a number: <0.30). Liver Function Tests: No results for input(s): "AST", "ALT", "ALKPHOS",  "BILITOT", "PROT", "ALBUMIN" in the last 168 hours.  No results for input(s): "LIPASE", "AMYLASE" in the last 168 hours. No results for input(s): "AMMONIA" in the last 168 hours. Coagulation profile No results for input(s): "INR", "PROTIME" in the last 168 hours.   CBC: Recent Labs  Lab 12/07/21 0245 12/08/21 0509 12/09/21 1200 12/10/21 0440 12/11/21 0451 12/12/21 0226 12/13/21 0253  WBC 1.6*   < > 1.4* 1.3* 1.6* 1.5* 2.0*  NEUTROABS 0.5*  --  0.4*  --   --   --  0.3*  HGB 8.7*   < > 7.7* 7.6* 9.1* 8.8* 8.9*  HCT 26.8*   < > 23.8* 23.4* 27.9* 27.1* 27.0*  MCV 93.4   < > 94.4 92.5 93.0 91.9 91.5  PLT 34*   < > 25* 11* 22* 13* 24*   < > = values in this interval not displayed.   Cardiac Enzymes: No results for input(s): "CKTOTAL", "CKMB", "CKMBINDEX", "TROPONINI" in the last 168 hours. BNP: Invalid input(s): "POCBNP" CBG: Recent Labs  Lab 12/12/21 1630 12/12/21 2029 12/13/21 0722 12/13/21 1154 12/13/21 1652  GLUCAP 314* 331* 92 273* 279*   D-Dimer No results for input(s): "DDIMER" in the  last 72 hours. Hgb A1c No results for input(s): "HGBA1C" in the last 72 hours. Lipid Profile No results for input(s): "CHOL", "HDL", "LDLCALC", "TRIG", "CHOLHDL", "LDLDIRECT" in the last 72 hours. Thyroid function studies No results for input(s): "TSH", "T4TOTAL", "T3FREE", "THYROIDAB" in the last 72 hours.  Invalid input(s): "FREET3" Anemia work up No results for input(s): "VITAMINB12", "FOLATE", "FERRITIN", "TIBC", "IRON", "RETICCTPCT" in the last 72 hours. Microbiology Recent Results (from the past 240 hour(s))  Culture, blood (Routine X 2) w Reflex to ID Panel     Status: None   Collection Time: 12/05/21 11:45 AM   Specimen: BLOOD  Result Value Ref Range Status   Specimen Description   Final    BLOOD SITE NOT SPECIFIED Performed at [facility] [address] [place], [place] [address]    Special Requests   Final    BOTTLES DRAWN AEROBIC AND  ANAEROBIC Blood Culture adequate volume Performed at [facility] [address] [place], [place] [address]    Culture   Final    NO GROWTH 5 DAYS Performed at [facility], [place] [address], [place], [place] [address]    Report Status 12/10/2021 FINAL  Final  Culture, blood (Routine X 2) w Reflex to ID Panel     Status: None   Collection Time: 12/05/21 11:45 AM   Specimen: BLOOD  Result Value Ref Range Status   Specimen Description   Final    BLOOD SITE NOT SPECIFIED Performed at [facility] [address], [place], [place] [address]    Special Requests   Final    BOTTLES DRAWN AEROBIC AND ANAEROBIC Blood Culture adequate volume Performed at [facility] [address], [place], [place] [address]    Culture   Final    NO GROWTH 5 DAYS Performed at [facility], [place] [address], [place], [place] [address]    Report Status 12/10/2021 FINAL  Final  Expectorated Sputum Assessment w Gram Stain, Rflx to Resp Cult     Status: None   Collection Time: 12/05/21  6:35 PM   Specimen: Sputum  Result Value Ref Range Status   Specimen Description   Final    SPU Performed at [facility] [address], [place], [place] [address]    Special Requests   Final    NONE Performed at [facility], [place] [address], [place], [place] [address]    Sputum evaluation THIS SPECIMEN IS ACCEPTABLE FOR SPUTUM CULTURE  Final   Report Status 12/05/2021 FINAL  Final  Culture, Respiratory w Gram Stain     Status: None   Collection Time: 12/05/21  6:35 PM   Specimen: Sputum  Result Value Ref Range Status   Specimen Description   Final    SPU Performed at [facility] [address], [place], [place] [address]    Special Requests   Final    NONE Reflexed from [id number] Performed at [facility], [place] [address], [place], [place] [address]    Gram Stain   Final     FEW WBC PRESENT, PREDOMINANTLY MONONUCLEAR FEW SQUAMOUS EPITHELIAL CELLS PRESENT FEW GRAM VARIABLE ROD FEW BUDDING YEAST SEEN    Culture   Final    FEW Normal respiratory flora-no Staph aureus or Pseudomonas seen Performed at [facility], [address], [place],  [address]    Report Status 12/08/2021 FINAL  Final  Culture,  Respiratory w Gram Stain     Status: None   Collection Time: 12/06/21  6:15 PM   Specimen: Bronchoalveolar Lavage; Respiratory  Result Value Ref Range Status   Specimen Description   Final    BRONCHIAL ALVEOLAR LAVAGE Performed at [facility] [address], [place], [place] [address]    Special Requests   Final    NONE Performed at [facility], [place] [address] [place], [place] [address]    Gram Stain   Final    FEW WBC PRESENT, PREDOMINANTLY MONONUCLEAR NO ORGANISMS SEEN    Culture   Final    NO GROWTH 2 DAYS Performed at [facility] [address] [place], [place] [address]    Report Status 12/09/2021 FINAL  Final  Fungus Culture With Stain     Status: None (Preliminary result)   Collection Time: 12/06/21  6:15 PM   Specimen: Bronchial Alveolar Lavage  Result Value Ref Range Status   Fungus Stain Final report  Final    Comment: (NOTE) Performed At: [facility] [place], [place] [id number] Rush Farmer MD [id number]    Fungus (Mycology) Culture PENDING  Incomplete   Fungal Source BRONCHIAL ALVEOLAR LAVAGE  Final    Comment: Performed at [facility], [place] [address], [place], [place] [address]  Acid Fast Smear (AFB)     Status: None   Collection Time: 12/06/21  6:15 PM   Specimen: Bronchoalveolar Lavage; Respiratory  Result Value Ref Range Status   AFB Specimen Processing Concentration  Final   Acid Fast Smear Negative  Final    Comment: (NOTE) Performed At: [facility] [place], [place] [id number] Rush Farmer MD [id number]    Source (AFB) BRONCHIAL ALVEOLAR LAVAGE  Final    Comment: Performed at [facility] [address], [place], [place] [address]  Fungus Culture Result     Status: None   Collection Time: 12/06/21  6:15 PM  Result Value Ref Range Status   Result 1 Comment  Final    Comment: (NOTE) KOH/Calcofluor preparation:  no fungus observed. Performed At: [facility] [place], [place] [id number] Rush Farmer MD [id number]   Aspergillus Ag, BAL/Serum     Status: None   Collection Time: 12/07/21  8:36 AM   Specimen: Bronchial Alveolar Lavage; Respiratory  Result Value Ref Range Status   Aspergillus Ag, BAL/Serum 0.07 0.00 - 0.49 Index Final    Comment: (NOTE) Performed At: [facility] [place], [place] [id number] Rush Farmer MD [id number]   Culture, Respiratory w Gram Stain     Status: None   Collection Time: 12/08/21 11:32 AM   Specimen: Bronchoalveolar Lavage; Respiratory  Result Value Ref Range Status   Specimen Description   Final    BRONCHIAL ALVEOLAR LAVAGE Performed at [facility] [address] [place], [place] [address]    Special Requests   Final    NONE Performed at [facility], [place] [address], [place], [place] [address]    Gram Stain NO WBC SEEN NO ORGANISMS SEEN   Final   Culture   Final    NO GROWTH 2 DAYS Performed at [facility], [place] [address] [place], [place] [address]    Report Status 12/10/2021 FINAL  Final  Pneumocystis smear by DFA     Status: None   Collection Time: 12/08/21 11:32 AM   Specimen:  Bronchoalveolar Lavage; Respiratory  Result Value Ref Range Status   Specimen Source-PJ[facility] BRONCHIAL ALVEOLAR LAVAGE  Final   Pneumocystis jiroveci Ag NEGATIVE  Final    Comment: Performed at [facility] Performed at [facility] [address], [place], [place] [address]        Studies:  No results found.  Assessment: 66 y.o. female   Sepsis and septic shock from left lower lobe pneumonia and aspiration pneumonia, much imporved  Hypoxic respiratory failure secondary to pneumonia, improved  Neutropenic fever Pancytopenia, secondary to MDS and chemo Hyponatremia Type 2 diabetes and hyperglycemia History of breast cancer HTN  and PAF and monomorphic VT   Plan:  -Patient is recovering well, out of ICU, still weak, but about to participate PT/OT -antibiotics and supportive care per primary team, appreciate their excellent care  -overall prognosis is poor from MDS standpoint, she is full code now.  -likely will postpone next cycle chemo for a while, if she is able to recover well to take more care. It will be very reasonable to continue supportive care alone after hospital discharge, given the recent complication after chemotherapy. -We will continue to discuss [place] with pt and her family when she returns to office.  -Continue blood transfusion if hemoglobin less than 7.5, or platelet less than 15K -I will f/u as needed. Please call me if needed.    [name], MD 12/13/2021

## 2021-12-13 NOTE — Progress Notes (Incomplete)
[facility] Infectious Diseases Follow Up Note  Patient Identification: Patient Name: Amanda Duarte MRN: [id number] [place] Date: 12/01/2021 10:47 PM Age: 66 y.o.Today's Date: 12/13/2021  Reason for Visit: neutropenic fevers, pancytopenia, PNA   Principal Problem:   Sepsis with acute organ dysfunction without septic shock (HCC) Active Problems:   Essential hypertension   DM2 (diabetes mellitus, type 2) (HCC)   Malignant neoplasm of upper-inner quadrant of left breast in female, estrogen receptor positive ([place])   HLD (hyperlipidemia)   Pancytopenia (HCC)   Hyponatremia   Hypokalemia   MDS (myelodysplastic syndrome), high grade (HCC)   GERD (gastroesophageal reflux disease)   Acute respiratory failure with hypoxia (HCC)   On mechanically assisted ventilation (HCC)   Acute respiratory distress syndrome (ARDS) (HCC)   Septic shock (HCC)   Lobar pneumonia, unspecified organism (HCC)   Malnutrition of moderate degree   Aspiration pneumonia of left lower lobe due to gastric secretions (HCC)   Diffuse pulmonary alveolar hemorrhage   Current Antibiotics/Antifungals  Vancomycin  Meropenem  Micafungin   Lines/Hardwares: PICC in rt arm   Pertinent Microbiology  11/15 BAL Cx routine cx/ gram stain No growth, PJP ag negative , BAL  galactomannan negative  11/13 BAL fungal stain negative, Cx pending,  11/13 BAL AFB smear negative, Cx pending.  11/12 resp cultures with GVR, budding yeast Cx NRF 11/12 blood cx 2/2 sets NG  11/9 MRSA PCR negative  11/8 RVP negative 11/8 blood cx 2/2 sets NG  Fungitell Less than 31.25  Interval Events: afebrile for more than 72 hrs now. Transferred out of the ICU and currently de-escalated to room air   Assessment 66 year old female with PMH of breast cancer now with MDS with severe anemia and thrombocytopenia status post chemotherapy with azacitidine at the end of October who presented to the ED with  2-day history of weakness, fatigue progressing to fevers and confusion. Chest X-ray showing multifocal pneumonia. BAL 11/15 ( bloody return persisted in all 3 aliquots, no real change in color).  Recommendations DC Vancomycin, continue meropenem   Will add on CBC w diff Fu Histoplasma ag and fungitell   Rest of the management as per the primary team. Thank you for the consult. Please page with pertinent questions or concerns.  ______________________________________________________________________ Subjective patient seen and examined at the bedside. Her daughter at bedside. Breathing is better, mild cough. Denies fevers, chills and sweats. Denies nausea, vomiting, abdominal pain and diarrhea. Complains of bilateral thigh pain.  Vitals BP (!) 142/74 (BP Location: Right Arm)   Pulse 75   Temp 97.6 F (36.4 C) (Oral)   Resp 20   Ht 5' (1.524 m)   Wt 59.8 kg   SpO2 99%   BMI 25.75 kg/m      Physical Exam Constitutional:  sitting up in the recliner and having lunch     Comments:   Cardiovascular:     Rate and Rhythm: Normal rate and regular rhythm.     Heart sounds:  Pulmonary:     Effort: Pulmonary effort is normal on room air     Comments:   Abdominal:     Palpations: Abdomen is soft.     Tenderness: non distended and non tender   Musculoskeletal:        General: No swelling or tenderness in peripheral joints, mildly swollen bilateral hands no concerns for infection   Skin:    Comments: no obvious rashes,   Neurological:     General: Non focal, awake, alert and  oriented   Psychiatric:        Mood and Affect: Mood normal.   Pertinent Microbiology Results for orders placed or performed during the hospital encounter of 12/01/21  Blood Culture (routine x 2)     Status: None   Collection Time: 12/01/21 11:13 PM   Specimen: BLOOD  Result Value Ref Range Status   Specimen Description   Final    BLOOD RIGHT ANTECUBITAL Performed at [facility] [address] [place], [place] [address]    Special Requests   Final    BOTTLES DRAWN AEROBIC AND ANAEROBIC Blood Culture results may not be optimal due to an excessive volume of blood received in culture bottles Performed at [facility] [address], [place], [place] [address]    Culture   Final    NO GROWTH 5 DAYS Performed at [facility], [place] [address] [place], [place] [address]    Report Status 12/07/2021 FINAL  Final  Blood Culture (routine x 2)     Status: None   Collection Time: 12/01/21 11:13 PM   Specimen: BLOOD  Result Value Ref Range Status   Specimen Description   Final    BLOOD SITE NOT SPECIFIED Performed at [facility] [address], [place], [place] [address]    Special Requests   Final    NONE Performed at [facility], [place] [address], [place], [place] [address]    Culture   Final    NO GROWTH 5 DAYS Performed at [facility], [place] [address] [place], [place] [address]    Report Status 12/07/2021 FINAL  Final  Resp Panel by RT-PCR (Flu A&B, Covid) Anterior Nasal Swab     Status: None   Collection Time: 12/01/21 11:25 PM   Specimen: Anterior Nasal Swab  Result Value Ref Range Status   SARS Coronavirus 2 by RT PCR NEGATIVE NEGATIVE Final    Comment: (NOTE) SARS-CoV-2 target nucleic acids are NOT DETECTED.  The SARS-CoV-2 RNA is generally detectable in upper respiratory specimens during the acute phase of infection. The lowest concentration of SARS-CoV-2 viral copies this assay can detect is 138 copies/mL. A negative result does not preclude SARS-Cov-2 infection and should not be used as the sole basis for treatment or other patient management decisions. A negative result may occur with  improper specimen collection/handling, submission of specimen other than nasopharyngeal swab, presence of viral mutation(s) within the areas targeted by this assay, and inadequate number of  viral copies(<138 copies/mL). A negative result must be combined with clinical observations, patient history, and epidemiological information. The expected result is Negative.  Fact Sheet for Patients:  EntrepreneurPulse.com.au  Fact Sheet for Healthcare Providers:  IncredibleEmployment.be  This test is no t yet approved or cleared by the [place] FDA and  has been authorized for detection and/or diagnosis of SARS-CoV-2 by FDA under an Emergency Use Authorization (EUA). This EUA will remain  in effect (meaning this test can be used) for the duration of the COVID-19 declaration under Section 564(b)(1) of the Act, 21 U.S.C.section 360bbb-3(b)(1), unless the authorization is terminated  or revoked sooner.       Influenza A by PCR NEGATIVE NEGATIVE Final   Influenza B by PCR NEGATIVE NEGATIVE Final    Comment: (NOTE) The Xpert Xpress SARS-CoV-2/FLU/RSV plus assay is intended as an aid in the diagnosis of influenza from Nasopharyngeal swab specimens and should not be used as a sole basis  for treatment. Nasal washings and aspirates are unacceptable for Xpert Xpress SARS-CoV-2/FLU/RSV testing.  Fact Sheet for Patients: EntrepreneurPulse.com.au  Fact Sheet for Healthcare Providers: IncredibleEmployment.be  This test is not yet approved or cleared by the [place] FDA and has been authorized for detection and/or diagnosis of SARS-CoV-2 by FDA under an Emergency Use Authorization (EUA). This EUA will remain in effect (meaning this test can be used) for the duration of the COVID-19 declaration under Section 564(b)(1) of the Act, 21 U.S.C. section 360bbb-3(b)(1), unless the authorization is terminated or revoked.  Performed at [facility], [place] [address], [place], [place] [address]   Respiratory (~20 pathogens) panel by PCR     Status: None   Collection Time: 12/01/21 11:25 PM    Specimen: Nasopharyngeal Swab; Respiratory  Result Value Ref Range Status   Adenovirus NOT DETECTED NOT DETECTED Final   Coronavirus 229E NOT DETECTED NOT DETECTED Final    Comment: (NOTE) The Coronavirus on the Respiratory Panel, DOES NOT test for the novel  Coronavirus (2019 nCoV)    Coronavirus HKU1 NOT DETECTED NOT DETECTED Final   Coronavirus NL63 NOT DETECTED NOT DETECTED Final   Coronavirus OC43 NOT DETECTED NOT DETECTED Final   Metapneumovirus NOT DETECTED NOT DETECTED Final   Rhinovirus / Enterovirus NOT DETECTED NOT DETECTED Final   Influenza A NOT DETECTED NOT DETECTED Final   Influenza B NOT DETECTED NOT DETECTED Final   Parainfluenza Virus 1 NOT DETECTED NOT DETECTED Final   Parainfluenza Virus 2 NOT DETECTED NOT DETECTED Final   Parainfluenza Virus 3 NOT DETECTED NOT DETECTED Final   Parainfluenza Virus 4 NOT DETECTED NOT DETECTED Final   Respiratory Syncytial Virus NOT DETECTED NOT DETECTED Final   Bordetella pertussis NOT DETECTED NOT DETECTED Final   Bordetella Parapertussis NOT DETECTED NOT DETECTED Final   Chlamydophila pneumoniae NOT DETECTED NOT DETECTED Final   Mycoplasma pneumoniae NOT DETECTED NOT DETECTED Final    Comment: Performed at [facility], [place] [address], [place], [place] [address]  Urine Culture     Status: Abnormal   Collection Time: 12/02/21  3:00 AM   Specimen: In/Out Cath Urine  Result Value Ref Range Status   Specimen Description   Final    IN/OUT CATH URINE Performed at [facility] [address] [place], [place] [address]    Special Requests   Final    NONE Performed at [facility], [place] [address], [place], [place] [address]    Culture MULTIPLE SPECIES PRESENT, SUGGEST RECOLLECTION (A)  Final   Report Status 12/03/2021 FINAL  Final  MRSA Next Gen by PCR, Nasal     Status: None   Collection Time: 12/02/21  1:04 PM   Specimen: Nasal Mucosa; Nasal Swab  Result Value Ref Range Status    MRSA by PCR Next Gen NOT DETECTED NOT DETECTED Final    Comment: (NOTE) The GeneXpert MRSA Assay (FDA approved for NASAL specimens only), is one component of a comprehensive MRSA colonization surveillance program. It is not intended to diagnose MRSA infection nor to guide or monitor treatment for MRSA infections. Test performance is not FDA approved in patients less than 66 years old. Performed at [facility], [place] [address], [place], [place] [address]   Culture, blood (Routine X 2) w Reflex to ID Panel     Status: None   Collection Time: 12/05/21 11:45 AM   Specimen: BLOOD  Result Value Ref Range Status   Specimen  Description   Final    BLOOD SITE NOT SPECIFIED Performed at [facility] [address], [place], [place] [address]    Special Requests   Final    BOTTLES DRAWN AEROBIC AND ANAEROBIC Blood Culture adequate volume Performed at [facility] [address], [place], [place] [address]    Culture   Final    NO GROWTH 5 DAYS Performed at [facility], [place] [address] [place], [place] [address]    Report Status 12/10/2021 FINAL  Final  Culture, blood (Routine X 2) w Reflex to ID Panel     Status: None   Collection Time: 12/05/21 11:45 AM   Specimen: BLOOD  Result Value Ref Range Status   Specimen Description   Final    BLOOD SITE NOT SPECIFIED Performed at [facility] [address], [place], [place] [address]    Special Requests   Final    BOTTLES DRAWN AEROBIC AND ANAEROBIC Blood Culture adequate volume Performed at [facility] [address] [place], [place] [address]    Culture   Final    NO GROWTH 5 DAYS Performed at [facility], [place] [address] [place], [place] [address]    Report Status 12/10/2021 FINAL  Final  Expectorated Sputum Assessment w Gram Stain, Rflx to Resp Cult     Status: None   Collection Time: 12/05/21  6:35 PM   Specimen: Sputum   Result Value Ref Range Status   Specimen Description   Final    SPU Performed at [facility] [address] [place], [place] [address]    Special Requests   Final    NONE Performed at [facility], [place] [address] [place], [place] [address]    Sputum evaluation THIS SPECIMEN IS ACCEPTABLE FOR SPUTUM CULTURE  Final   Report Status 12/05/2021 FINAL  Final  Culture, Respiratory w Gram Stain     Status: None   Collection Time: 12/05/21  6:35 PM   Specimen: Sputum  Result Value Ref Range Status   Specimen Description   Final    SPU Performed at [facility] [address], [place], [place] [address]    Special Requests   Final    NONE Reflexed from [id number] Performed at [facility], [place] [address], [place], [place] [address]    Gram Stain   Final    FEW WBC PRESENT, PREDOMINANTLY MONONUCLEAR FEW SQUAMOUS EPITHELIAL CELLS PRESENT FEW GRAM VARIABLE ROD FEW BUDDING YEAST SEEN    Culture   Final    FEW Normal respiratory flora-no Staph aureus or Pseudomonas seen Performed at [facility], [address], [place], [place] [address]    Report Status 12/08/2021 FINAL  Final  Culture, Respiratory w Gram Stain     Status: None   Collection Time: 12/06/21  6:15 PM   Specimen: Bronchoalveolar Lavage; Respiratory  Result Value Ref Range Status   Specimen Description   Final    BRONCHIAL ALVEOLAR LAVAGE Performed at [facility] [address], [place], [place] [address]    Special Requests   Final    NONE Performed at [facility], [place] [address], [place], [place] [address]    Gram Stain   Final    FEW WBC PRESENT, PREDOMINANTLY MONONUCLEAR NO ORGANISMS SEEN    Culture   Final    NO GROWTH 2 DAYS Performed at  [facility], [place] [address] [place], [place] [address]    Report Status 12/09/2021 FINAL  Final  Fungus Culture With Stain     Status: None  (Preliminary result)   Collection Time: 12/06/21  6:15 PM   Specimen: Bronchial Alveolar Lavage  Result Value Ref Range Status   Fungus Stain Final report  Final    Comment: (NOTE) Performed At: [facility] [place], [place] [id number] Rush Farmer MD FI:[id number]    Fungus (Mycology) Culture PENDING  Incomplete   Fungal Source BRONCHIAL ALVEOLAR LAVAGE  Final    Comment: Performed at [facility], [place] [address] [place], [place] [address]  Acid Fast Smear (AFB)     Status: None   Collection Time: 12/06/21  6:15 PM   Specimen: Bronchoalveolar Lavage; Respiratory  Result Value Ref Range Status   AFB Specimen Processing Concentration  Final   Acid Fast Smear Negative  Final    Comment: (NOTE) Performed At: [facility] [place], [place] [id number] Rush Farmer MD AT:[id number]    Source (AFB) BRONCHIAL ALVEOLAR LAVAGE  Final    Comment: Performed at [facility] [address] [place], [place] [address]  Fungus Culture Result     Status: None   Collection Time: 12/06/21  6:15 PM  Result Value Ref Range Status   Result 1 Comment  Final    Comment: (NOTE) KOH/Calcofluor preparation:  no fungus observed. Performed At: [facility] [place], [place] [id number] Rush Farmer MD VO:[id number]   Aspergillus Ag, BAL/Serum     Status: None   Collection Time: 12/07/21  8:36 AM   Specimen: Bronchial Alveolar Lavage; Respiratory  Result Value Ref Range Status   Aspergillus Ag, BAL/Serum 0.07 0.00 - 0.49 Index Final    Comment: (NOTE) Performed At: [facility] [place], [place] [id number] Rush Farmer MD OJ:[id number]   Culture, Respiratory w Gram Stain     Status: None   Collection Time: 12/08/21 11:32 AM   Specimen: Bronchoalveolar Lavage; Respiratory  Result Value Ref Range Status   Specimen Description   Final    BRONCHIAL ALVEOLAR  LAVAGE Performed at [facility] [address], [place], [place] [address]    Special Requests   Final    NONE Performed at [facility], [place] [address], [place], [place] [address]    Gram Stain NO WBC SEEN NO ORGANISMS SEEN   Final   Culture   Final    NO GROWTH 2 DAYS Performed at [facility], [place] [address], [place], [place] [address]    Report Status 12/10/2021 FINAL  Final  Pneumocystis smear by DFA     Status: None   Collection Time: 12/08/21 11:32 AM   Specimen: Bronchoalveolar Lavage; Respiratory  Result Value Ref Range Status   Specimen Source-PJSRC BRONCHIAL ALVEOLAR LAVAGE  Final   Pneumocystis jiroveci Ag NEGATIVE  Final    Comment: Performed at [place] Performed at [facility] [address] [place], [place] [address]      Pertinent Lab.    Latest Ref Rng & Units 12/13/2021    2:53 AM 12/12/2021    2:26 AM 12/11/2021    4:51 AM  CBC  WBC 4.0 - 10.5 K/uL 2.0  1.5  1.6   Hemoglobin 12.0 - 15.0 g/dL 8.9  8.8  9.1   Hematocrit 36.0 - 46.0 %  27.0  27.1  27.9   Platelets 150 - 400 K/uL _0 Latest Ref Rng & Units 12/13/2021    2:53 AM 12/12/2021    2:26 AM 12/11/2021    4:51 AM  CMP  Glucose 70 - 99 mg/dL 135  172  354   BUN 8 - 23 mg/dL _1 Creatinine 0.44 - 1.00 mg/dL <0.30  0.37  0.40   Sodium 135 - 145 mmol/L 138  140  145   Potassium 3.5 - 5.1 mmol/L 3.6  3.4  3.6   Chloride 98 - 111 mmol/L 103  103  110   CO2 22 - 32 mmol/L _2 Calcium 8.9 - 10.3 mg/dL 8.6  8.5  8.6      Pertinent Imaging today Plain films and CT images have been personally visualized and interpreted; radiology reports have been reviewed. Decision making incorporated into the Impression / Recommendations.  [name] EKG SITE RITE  Result Date: 12/11/2021 If Site Rite image not attached, placement could not be confirmed due to current cardiac rhythm.  DG CHEST PORT  1 VIEW  Result Date: 12/08/2021 CLINICAL DATA:  [id number] with acute respiratory failure with hypoxia and ventilator dependence. EXAM: PORTABLE CHEST 1 VIEW COMPARISON:  Portable 12/06/2021 FINDINGS: 4:41 a.m. ETT interval pullback to 2.6 cm from the carina. The left IJ central line again terminates in the distal SVC. NGT is partially visible coiled in the stomach. Neither the side-hole or the tip of the tube are filmed. The cardiac size is normal. Patchy dense opacities of the left mid and both lower lung fields appear similar, denser on the left as before and with small underlying left pleural effusion. The right mid and both apical lung regions remain generally clear. Overall aeration seems unchanged. The mediastinal configuration is normal. There are left axillary surgical clips, multiple overlying monitor wires. No acute osseous findings. IMPRESSION: 1. ETT interval pullback to 2.6 cm from the carina. 2. NGT coiled in the stomach. 3. Left IJ central line again terminates in the distal SVC. 4. No interval change in the appearance of the lungs. Electronically Signed   By: Telford Nab M.D.   On: 12/08/2021 07:32   ECHOCARDIOGRAM COMPLETE  Result Date: 12/07/2021    ECHOCARDIOGRAM REPORT   Patient Name:   Amanda Duarte Date of Exam: 12/07/2021 Medical Rec #:  [id number]   Height:       60.0 in Accession #:    [id number]  Weight:       119.5 lb Date of Birth:  [birth year]    BSA:          1.500 m Patient Age:    62 years    BP:           108/43 mmHg Patient Gender: F           HR:           86 bpm. Exam Location:  Inpatient Procedure: 2D Echo Indications:    Fever  History:        Patient has prior history of Echocardiogram examinations, most                 recent 06/01/2017. Risk Factors:Hypertension and Diabetes.  Sonographer:    Harvie Junior Referring Phys: [phone] [place]  1. Left ventricular ejection fraction, by estimation, is 45 to 50%. The left ventricle  has mildly decreased function. The  left ventricle has no regional wall motion abnormalities. Left ventricular diastolic parameters are indeterminate.  2. Right ventricular systolic function is normal. The right ventricular size is normal. There is normal pulmonary artery systolic pressure.  3. The mitral valve is grossly normal. No evidence of mitral valve regurgitation. No evidence of mitral stenosis.  4. The aortic valve is grossly normal. Aortic valve regurgitation is not visualized. No aortic stenosis is present.  5. The inferior vena cava is normal in size with <50% respiratory variability, suggesting right atrial pressure of 8 mmHg. Comparison(s): Changes from prior study are noted. EF mildly reduced on this study compared to prior, no focal wall motion abnormalities (globally mildly reduced). FINDINGS  Left Ventricle: Left ventricular ejection fraction, by estimation, is 45 to 50%. The left ventricle has mildly decreased function. The left ventricle has no regional wall motion abnormalities. The left ventricular internal cavity size was normal in size. There is no left ventricular hypertrophy. Left ventricular diastolic parameters are indeterminate. Right Ventricle: The right ventricular size is normal. No increase in right ventricular wall thickness. Right ventricular systolic function is normal. There is normal pulmonary artery systolic pressure. The tricuspid regurgitant velocity is 2.63 m/s, and  with an assumed right atrial pressure of 8 mmHg, the estimated right ventricular systolic pressure is 02.7 mmHg. Left Atrium: Left atrial size was normal in size. Right Atrium: Right atrial size was normal in size. Pericardium: There is no evidence of pericardial effusion. Mitral Valve: The mitral valve is grossly normal. No evidence of mitral valve regurgitation. No evidence of mitral valve stenosis. Tricuspid Valve: The tricuspid valve is normal in structure. Tricuspid valve regurgitation is mild . No evidence of tricuspid stenosis. Aortic  Valve: The aortic valve is grossly normal. Aortic valve regurgitation is not visualized. No aortic stenosis is present. Aortic valve mean gradient measures 5.0 mmHg. Aortic valve peak gradient measures 8.5 mmHg. Aortic valve area, by VTI measures 1.40 cm. Pulmonic Valve: The pulmonic valve was not well visualized. Pulmonic valve regurgitation is not visualized. No evidence of pulmonic stenosis. Aorta: The aortic root and ascending aorta are structurally normal, with no evidence of dilitation. Venous: The inferior vena cava is normal in size with less than 50% respiratory variability, suggesting right atrial pressure of 8 mmHg. IAS/Shunts: The atrial septum is grossly normal.  LEFT VENTRICLE PLAX 2D LVIDd:         4.10 cm     Diastology LVIDs:         3.20 cm     LV e' medial:    7.62 cm/s LV PW:         0.80 cm     LV E/e' medial:  9.8 LV IVS:        0.75 cm     LV e' lateral:   6.20 cm/s LVOT diam:     1.70 cm     LV E/e' lateral: 12.0 LV SV:         37 LV SV Index:   25 LVOT Area:     2.27 cm  LV Volumes (MOD) LV vol d, MOD A2C: 77.7 ml LV vol d, MOD A4C: 54.1 ml LV vol s, MOD A2C: 44.1 ml LV vol s, MOD A4C: 32.4 ml LV SV MOD A2C:     33.6 ml LV SV MOD A4C:     54.1 ml LV SV MOD BP:      27.4 ml RIGHT VENTRICLE RV Basal diam:  2.80 cm RV Mid diam:    2.20 cm TAPSE (M-mode): 1.6 cm LEFT ATRIUM             Index        RIGHT ATRIUM          Index LA diam:        2.90 cm 1.93 cm/m   RA Area:     7.72 cm LA Vol (A2C):   27.2 ml 18.14 ml/m  RA Volume:   14.30 ml 9.54 ml/m LA Vol (A4C):   22.9 ml 15.27 ml/m LA Biplane Vol: 25.4 ml 16.94 ml/m  AORTIC VALVE                     PULMONIC VALVE AV Area (Vmax):    1.43 cm      PV Vmax:       0.91 m/s AV Area (Vmean):   1.43 cm      PV Peak grad:  3.3 mmHg AV Area (VTI):     1.40 cm AV Vmax:           146.00 cm/s AV Vmean:          108.000 cm/s AV VTI:            0.264 m AV Peak Grad:      8.5 mmHg AV Mean Grad:      5.0 mmHg LVOT Vmax:         92.00 cm/s LVOT  Vmean:        68.100 cm/s LVOT VTI:          0.163 m LVOT/AV VTI ratio: 0.62  AORTA Ao Root diam: 2.90 cm MITRAL VALVE               TRICUSPID VALVE MV Area (PHT): 3.53 cm    TR Peak grad:   27.7 mmHg MV Decel Time: 215 msec    TR Vmax:        263.00 cm/s MV E velocity: 74.60 cm/s MV A velocity: 81.80 cm/s  SHUNTS MV E/A ratio:  0.91        Systemic VTI:  0.16 m                            Systemic Diam: 1.70 cm Buford Dresser MD Electronically signed by Buford Dresser MD Signature Date/Time: 12/07/2021/4:20:28 PM    Final    DG CHEST PORT 1 VIEW  Result Date: 12/06/2021 CLINICAL DATA:  Status post central line placement EXAM: PORTABLE CHEST 1 VIEW COMPARISON:  Chest x-ray dated December 06, 2021 obtained at 6:18 p.m. FINDINGS: Slight interval retraction of ET tube which is approximately 1.0 cm from the carina. Interval placement of left IJ line with tip overlying the expected area of the mid SVC. Enteric tube partially seen coursing below the diaphragm. Right-greater-than-left heterogeneous opacities, unchanged. No large pleural effusion or evidence of pneumothorax. IMPRESSION: 1. Interval placement of left IJ line with tip overlying the expected area of the mid SVC. 2. Slight interval retraction of ET tube which is approximately 1.0 cm from the carina. Consider further retraction for optimal positioning. Electronically Signed   By: Yetta Glassman M.D.   On: 12/06/2021 19:44   DG Abd 1 View  Result Date: 12/06/2021 CLINICAL DATA:  Orogastric tube placement EXAM: ABDOMEN - 1 VIEW COMPARISON:  CT 12/05/2021 FINDINGS: Limited radiograph of the lower chest and upper abdomen was obtained for the purposes  of enteric tube localization. Enteric tube is seen coursing below the diaphragm with distal tip and side port terminating within the expected location of the distal stomach. Endotracheal tube position in the right mainstem bronchus, as seen on dedicated chest x-ray. IMPRESSION: Enteric tube tip  and side port project within the expected location of the distal stomach. Electronically Signed   By: Davina Poke D.O.   On: 12/06/2021 19:00   DG CHEST PORT 1 VIEW  Result Date: 12/06/2021 CLINICAL DATA:  Intubation EXAM: PORTABLE CHEST 1 VIEW COMPARISON:  12/06/2021 at 0845 hours FINDINGS: Interval placement of endotracheal tube extending into the proximal right mainstem bronchus. Interval placement of enteric tube which is coiled within the stomach. Heart size within normal limits. Similar bilateral airspace opacities with progressive opacification of the left lung, likely superimposed atelectasis. No pneumothorax. IMPRESSION: 1. Interval placement of endotracheal tube extending into the proximal right mainstem bronchus. Recommend retraction approximately 4 cm. 2. Increasing left lung opacification, likely atelectasis. 3. Interval placement of enteric tube which is coiled within the stomach. These results will be called to the ordering clinician or representative by the Radiologist Assistant, and communication documented in the PACS or [employer]. Electronically Signed   By: Davina Poke D.O.   On: 12/06/2021 18:57   DG CHEST PORT 1 VIEW  Result Date: 12/06/2021 CLINICAL DATA:  Pneumonia EXAM: PORTABLE CHEST 1 VIEW COMPARISON:  Previous studies including the chest radiograph done on 12/02/2021 and CT done on 12/05/2021 FINDINGS: Transverse diameter of heart is increased. Large alveolar infiltrates are seen in left upper, mid and lower lung fields with interval worsening. There are new patchy infiltrates in right lung. Right lateral CP angle is clear. There is no pneumothorax. Surgical clips are seen in left axilla and left chest wall. IMPRESSION: Cardiomegaly. Extensive infiltrates are seen in left lung with interval progression. There are multiple patchy infiltrates in the right lung. Findings suggest interval worsening of bilateral multifocal pneumonia. Electronically Signed   By:  Elmer Picker M.D.   On: 12/06/2021 09:46   CT CHEST ABDOMEN PELVIS W CONTRAST  Result Date: 12/05/2021 CLINICAL DATA:  Sepsis.  Persistent fever.  Pancytopenia. EXAM: CT CHEST, ABDOMEN, AND PELVIS WITH CONTRAST TECHNIQUE: Multidetector CT imaging of the chest, abdomen and pelvis was performed following the standard protocol during bolus administration of intravenous contrast. RADIATION DOSE REDUCTION: This exam was performed according to the departmental dose-optimization program which includes automated exposure control, adjustment of the mA and/or kV according to patient size and/or use of iterative reconstruction technique. CONTRAST:  181m OMNIPAQUE IOHEXOL 300 MG/ML  SOLN COMPARISON:  10/21/2021 FINDINGS: CT CHEST FINDINGS Cardiovascular: Heart size appears normal. No pericardial effusion. Aortic atherosclerosis. Mediastinum/Nodes: Thyroid gland, trachea, and esophagus are unremarkable. Previous left axillary node dissection. No axillary, mediastinal, or hilar adenopathy identified. Lungs/Pleura: Severe, multifocal bilateral airspace opacities identified compatible with pneumonia. This is most severe within the left lower lobe where there is near complete consolidation. There is dense airspace consolidation noted within the posterolateral right lower lobe with overlying areas of ground-glass attenuation. Airspace disease within the inferior lingula is also identified. Patchy area of ground-glass attenuation in the left apex is unchanged from the previous exam. There is a small left pleural effusion which overlies the posterior right upper lobe and superior segment of left lower lobe. There is also a small amount of fluid identified overlying the left lung base. Musculoskeletal: Left mastectomy. No acute or suspicious osseous findings. CT ABDOMEN PELVIS FINDINGS Hepatobiliary: No focal  liver abnormality is seen. No gallstones, gallbladder wall thickening, or biliary dilatation. Pancreas:  Unremarkable. No pancreatic ductal dilatation or surrounding inflammatory changes. Spleen: Normal in size without focal abnormality. Adrenals/Urinary Tract: Normal adrenal glands. No nephrolithiasis, hydronephrosis or mass. Urinary bladder appears normal. Stomach/Bowel: Stomach is within normal limits. Appendix appears normal. No evidence of bowel wall thickening, distention, or inflammatory changes. Vascular/Lymphatic: Aortic atherosclerosis. No signs of abdominopelvic adenopathy. Reproductive: Uterus and bilateral adnexa are unremarkable. Other: No free fluid or fluid collections. Musculoskeletal: No acute or significant osseous findings. Scattered subcutaneous injection sites identified within the subcutaneous soft tissues of the ventral abdominal wall. IMPRESSION: 1. Severe, multifocal bilateral airspace opacities compatible with pneumonia. This is most severe within the left lower lobe where there is near complete airspace consolidation. 2. Small left pleural effusion. 3. No acute findings identified within the abdomen or pelvis. 4.  Aortic Atherosclerosis (ICD10-I70.0). Electronically Signed   By: Kerby Moors M.D.   On: 12/05/2021 14:14   DG CHEST PORT 1 VIEW  Result Date: 12/02/2021 CLINICAL DATA:  Hypoxia. EXAM: PORTABLE CHEST 1 VIEW COMPARISON:  December 02, 2021 (8:25 a.m.) FINDINGS: The heart size and mediastinal contours are within normal limits. Persistent moderate severity left upper lobe and left lower lobe atelectasis and/or infiltrate is seen. There is a small, stable left pleural effusion. No pneumothorax is identified. Multiple surgical clips are seen along the left axilla. The visualized skeletal structures are unremarkable. IMPRESSION: Persistent moderate severity left upper lobe and left lower lobe atelectasis and/or infiltrate. Electronically Signed   By: Virgina Norfolk M.D.   On: 12/02/2021 20:15   DG Chest Port 1 View  Result Date: 12/02/2021 CLINICAL DATA:  Shortness of breath.  EXAM: PORTABLE CHEST 1 VIEW COMPARISON:  Chest x-ray 11 8 2023 FINDINGS: The cardiac silhouette, mediastinal contours are stable. Persistent and slightly progressive left upper and lower lobe pneumonia. Suspect small parapneumonic effusion. The right lung remains clear. IMPRESSION: Persistent and slightly progressive left upper and lower lobe pneumonia. Electronically Signed   By: Marijo Sanes M.D.   On: 12/02/2021 08:44   DG Chest Port 1 View  Result Date: 12/01/2021 CLINICAL DATA:  Possible sepsis low-grade fever EXAM: PORTABLE CHEST 1 VIEW COMPARISON:  CT 10/21/2021 FINDINGS: Airspace disease at the left lung base. No pleural effusion. Mild cardiomegaly. No pneumothorax. Clips in the left axilla IMPRESSION: Airspace disease at the left lung base concerning for pneumonia. Cardiomegaly. Electronically Signed   By: Donavan Foil M.D.   On: 12/01/2021 23:01     I spent 65 minutes for this patient encounter including review of prior medical records, coordination of care with primary/other specialist with greater than 50% of time being face to face/counseling and discussing diagnostics/treatment plan with the patient/family.  Electronically signed by:   Rosiland Oz, MD Infectious Disease Physician [facility] Pager: [phone]

## 2021-12-14 ENCOUNTER — Ambulatory Visit: Payer: Medicare Other

## 2021-12-14 DIAGNOSIS — D709 Neutropenia, unspecified: Secondary | ICD-10-CM | POA: Diagnosis present

## 2021-12-14 DIAGNOSIS — R5081 Fever presenting with conditions classified elsewhere: Secondary | ICD-10-CM | POA: Diagnosis present

## 2021-12-14 DIAGNOSIS — D469 Myelodysplastic syndrome, unspecified: Secondary | ICD-10-CM | POA: Diagnosis not present

## 2021-12-14 LAB — CBC WITH DIFFERENTIAL/PLATELET
Abs Immature Granulocytes: 0.03 10*3/uL (ref 0.00–0.07)
Basophils Absolute: 0 10*3/uL (ref 0.0–0.1)
Basophils Relative: 0 %
Eosinophils Absolute: 0 10*3/uL (ref 0.0–0.5)
Eosinophils Relative: 0 %
HCT: 26.2 % — ABNORMAL LOW (ref 36.0–46.0)
Hemoglobin: 8.8 g/dL — ABNORMAL LOW (ref 12.0–15.0)
Immature Granulocytes: 1 %
Lymphocytes Relative: 79 %
Lymphs Abs: 2.4 10*3/uL (ref 0.7–4.0)
MCH: 30.3 pg (ref 26.0–34.0)
MCHC: 33.6 g/dL (ref 30.0–36.0)
MCV: 90.3 fL (ref 80.0–100.0)
Monocytes Absolute: 0.1 10*3/uL (ref 0.1–1.0)
Monocytes Relative: 2 %
Neutro Abs: 0.6 10*3/uL — ABNORMAL LOW (ref 1.7–7.7)
Neutrophils Relative %: 18 %
Platelets: 13 10*3/uL — CL (ref 150–400)
RBC: 2.9 MIL/uL — ABNORMAL LOW (ref 3.87–5.11)
RDW: 14.9 % (ref 11.5–15.5)
WBC: 3.1 10*3/uL — ABNORMAL LOW (ref 4.0–10.5)
nRBC: 0 % (ref 0.0–0.2)

## 2021-12-14 LAB — BASIC METABOLIC PANEL
Anion gap: 5 (ref 5–15)
BUN: 17 mg/dL (ref 8–23)
CO2: 28 mmol/L (ref 22–32)
Calcium: 8.6 mg/dL — ABNORMAL LOW (ref 8.9–10.3)
Chloride: 102 mmol/L (ref 98–111)
Creatinine, Ser: 0.38 mg/dL — ABNORMAL LOW (ref 0.44–1.00)
GFR, Estimated: >60 mL/min (ref >60)
Glucose, Bld: 72 mg/dL (ref 70–99)
Potassium: 3.8 mmol/L (ref 3.5–5.1)
Sodium: 135 mmol/L (ref 135–145)

## 2021-12-14 LAB — GLUCOSE, CAPILLARY
Glucose-Capillary: 75 mg/dL (ref 70–99)
Glucose-Capillary: 152 mg/dL — ABNORMAL HIGH (ref 70–99)
Glucose-Capillary: 185 mg/dL — ABNORMAL HIGH (ref 70–99)
Glucose-Capillary: 336 mg/dL — ABNORMAL HIGH (ref 70–99)

## 2021-12-14 MED ORDER — GLUCERNA SHAKE PO LIQD
237.0000 mL | Freq: Three times a day (TID) | ORAL | Status: DC
  Administered 2021-12-14 – 2021-12-22 (×15): 237 mL via ORAL
  Filled 2021-12-14 (×26): qty 237

## 2021-12-14 MED ORDER — POLYETHYLENE GLYCOL 3350 17 G PO PACK
17.0000 g | PACK | Freq: Every day | ORAL | Status: DC
  Administered 2021-12-16 – 2021-12-20 (×4): 17 g via ORAL
  Filled 2021-12-14 (×5): qty 1

## 2021-12-14 MED ORDER — DOCUSATE SODIUM 100 MG PO CAPS
100.0000 mg | ORAL_CAPSULE | Freq: Two times a day (BID) | ORAL | Status: DC
  Administered 2021-12-16 – 2021-12-22 (×10): 100 mg via ORAL
  Filled 2021-12-14 (×14): qty 1

## 2021-12-14 NOTE — Telephone Encounter (Signed)
Patient is scheduled for HFU with Dr. Loanne Drilling on 01/03/2022 at 11:15am at the [facility] office. Reminder mailed to address on file.

## 2021-12-14 NOTE — Progress Notes (Signed)
PROGRESS NOTE Amanda Duarte  MPN:[id number] DOB: 1955-06-19 DOA: 12/01/2021 PCP: [name], NP   Brief Narrative/Hospital Course: 72 yof w/ left breast cancer (Dx 04/2017 S/P left mastectomy and adjuvant radiation), HTN HLD T2DM and recent diagnosis of high-grade myeloid Neoplasm/myelodysplastic syndrome (Bone marrow biopsy 10/2 with confirmatory biopsy 10/11 revealing 7% blasts) thought to be caused by history of chemotherapy complicated by pancytopenia presented to [facility]/ fatigue initially found to have sepsis secondary to left lower lobe pneumonia and admitted on 12/02/21.  Despite aggressive treatment with broad-spectrum intravenous antibiotics in the days that followed patient clinically worsened with recurrent fevers and worsening shortness of breath with progressively worsening acute hypoxic respiratory failure eventually prompting PCCM involvement and broadening of antibiotic coverage under the direction of infectious disease.   Hospital course was complicated by persisting pancytopenia requiring intermittent transfusions of blood products including platelets (1 pack on 11/4, 1 pack 11/16 and 1 pack 11/17),  and packed red blood cells (11/9).  Dr. Annamaria Boots with oncology has been following.  Current targets are transfusions for platelet count of less than 15 and hemoglobin of less than 7.   Unfortunately due to progressive respiratory failure patient eventually required intubation on 11/13 for progressive respiratory failure and transfer to the intensive care unit.  A bronchoscopy was performed at that time with BAL performed for cultures.  Repeat bronchoscopy was performed on 11/15 revealing evidence pulmonary hemorrhage, thought to be secondary to recurrent severe thrombocytopenia.  Hospital course was further complicated by the development of septic shock requiring intermittent vasopressors.  Patient was managed with Neo-Synephrine which has since been weaned off.  Patient also developed episodes of  paroxysmal atrial fibrillation.  Amiodarone was initially given but later this was discontinued on 11/14 after having episodes of polymorphic ventricular tachycardia and prolonged QTc.   Patient has since been weaned off of vasopressors.  Patient has been extubated on 11/16.  Patient is currently weaned to 5 L of oxygen via nasal cannula.  11/18: Transferred back to [facility] service.  Worked with PT 11/17 advised home health PT with RW 11/18> left IJ removed and RUE PICC line placed 11/20: ID discontinued Vancomycin ( MRSA PCR is negative, BAL cx and resp cx negative, blood cx negative) Remains on meropenem/micafungin, bactrim ppx while on high dose steroids for PJP ppx.   Subjective: Seen and examined.  Resting comfortably and patient has no complaints Overnight afebrile blood pressure 140s to 160s, on room air since 11/20 BMP stable CBC with differential is being redrawn  Assessment and Plan: ARDS> resolved Acute hypoxic and hypercapnic respiratory failure> resolved Septic shock-Resolved Multilobar pneumonia versus DAH immunocompromised setting: Initially in the ICU needing intubation, vasopressor support> clinically improved, on room air this morning. blood culture 11/12> NGTD, BAL culture 11/15 NGTD.  ID discontinued Vancomycin and remains on meropenem/micafungin w/ PICC line, Bactrim ppx while on high dose steroids for PJP prophylaxis.  Appreciate ID input.   PAF/polymorphic VT with prolonged Qtc:off amio. On monitor. Hypertension: BP stable, cont home losartan and prn meds HLD cont lipitor   Pancytopenia MDS Previous breast cancer history: [name] following.  S/p 2 Units PRBC 11/9 and 11/3. S/p multiple platelet transfusions (on  11/17, 11/16, 12/15, 11/13 x 2 and 11/19). On  Solu-Medrol> changed to 60 mg daily 11/18> to 60 mg po from 11/19->Taper ordered as per oncology.CBC pending today overall improving, continue antibiotic and prophylaxis as above. Goal of Hb > 7.5  and PLT > 15K per  hem-onc.  CBC pending today Recent Labs  Lab 12/11/21 0451 12/12/21 0226 12/13/21 0253  HGB 9.1* 8.8* 8.9*  HCT 27.9* 27.1* 27.0*  WBC 1.6* 1.5* 2.0*  PLT 22* 13* 24*   T2DM with uncontrolled hyperglycemia: at home on Amaryl 4 mg, Lantus 25 bedtime Janumet, Humalog 4 units Premeal .  Fluctuating overall stable continue current 20u Levemir bid, SSI 0-20 units,4 u premeal novolog. Of note on steroid here so monitor closely. Recent Labs  Lab 12/13/21 0722 12/13/21 1154 12/13/21 1652 12/13/21 2116 12/14/21 0725  GLUCAP 92 273* 279* 290* 75     Hypophosphatemia Hypokalemia: Improved.  Monitor  Moderate malnutrition augment diet with supplement-ensure, magic cup, boost , mvi, dietitian following  Goals of care currently full code.  Seen by palliative care. Overall prognosis is poor  DVT prophylaxis: SCDs Start: 12/02/21 1022 Code Status:   Code Status: Full Code Family Communication: plan of care discussed with patient and her daughter at bedside.  Patient status is: Patient because of pancytopenia, respiratory failure Level of care: Progressive  Dispo: The patient is from: home            Anticipated disposition: home w/ daughter w/ PTOT. Mobility Assessment (last 72 hours)     Mobility Assessment     Row Name 12/13/21 1602 12/13/21 1350 12/12/21 1455 12/11/21 2039     Does patient have an order for bedrest or is patient medically unstable -- -- No - Continue assessment No - Continue assessment    What is the highest level of mobility based on the progressive mobility assessment? Level 5 (Walks with assist in room/hall) - Balance while stepping forward/back and can walk in room with assist - Complete Level 5 (Walks with assist in room/hall) - Balance while stepping forward/back and can walk in room with assist - Complete Level 3 (Stands with assist) - Balance while standing  and cannot march in place --    Is the above level different from baseline mobility prior to current  illness? -- -- Yes - Recommend PT order --              Objective: Vitals last 24 hrs: Vitals:   12/13/21 1157 12/13/21 2029 12/14/21 0448 12/14/21 0853  BP: (!) 153/74 (!) 169/65 (!) 156/63 (!) 153/74  Pulse: 90 78 79 79  Resp: _0 Temp: 97.7 F (36.5 C) 97.7 F (36.5 C) 97.8 F (36.6 C)   TempSrc: Oral Axillary Axillary   SpO2: 93% 100% 96%   Weight:      Height:       Weight change:   Physical Examination: General exam: AAOX3, weak,older appearing HEENT:Oral mucosa moist, Ear/Nose WNL grossly, dentition normal. Respiratory system: bilaterally CLEAR BS, no use of accessory muscle Cardiovascular system: S1 & S2 +, regular rate. Gastrointestinal system: Abdomen soft,NT,ND,BS+ Nervous System:Alert, awake, moving extremities and grossly nonfocal Extremities: LE ankle edema NEG, lower extremities warm Skin: No rashes,no icterus. MSK: Normal muscle bulk,tone, power  Rue picc+  Medications reviewed:  Scheduled Meds:  sodium chloride   Intravenous Once   sodium chloride   Intravenous Once   atorvastatin  40 mg Oral Daily   Chlorhexidine Gluconate Cloth  6 each Topical Daily   docusate sodium  100 mg Oral BID   feeding supplement  1 Container Oral Q24H   feeding supplement  237 mL Oral Q24H   insulin aspart  0-20 Units Subcutaneous TID WC   insulin aspart  0-5 Units Subcutaneous QHS  insulin aspart  4 Units Subcutaneous TID WC   insulin detemir  20 Units Subcutaneous Q12H   losartan  25 mg Oral Daily   multivitamin with minerals  1 tablet Oral Daily   pantoprazole  40 mg Oral BID   polyethylene glycol  17 g Oral Daily   predniSONE  60 mg Oral Q breakfast   Followed by   Derrill Memo ON 12/19/2021] predniSONE  50 mg Oral Q breakfast   Followed by   Derrill Memo ON 12/26/2021] predniSONE  40 mg Oral Q breakfast   Followed by   Derrill Memo ON 01/02/2022] predniSONE  30 mg Oral Q breakfast   Followed by   Derrill Memo ON 01/09/2022] predniSONE  20 mg Oral Q breakfast   Followed  by   Derrill Memo ON 01/16/2022] predniSONE  10 mg Oral Q breakfast   sodium chloride flush  10-40 mL Intracatheter Q12H   sulfamethoxazole-trimethoprim  1 tablet Oral Once per day on Mon Wed Fri   Thrombi-Pad  1 each Topical Once   Continuous Infusions:  sodium chloride Stopped (12/07/21 1409)   meropenem (MERREM) IV 1 g (12/14/21 0605)   micafungin (MYCAMINE) 100 mg in sodium chloride 0.9 % 100 mL IVPB 100 mg (12/14/21 0905)      Diet Order             DIET DYS 3 Room service appropriate? Yes; Fluid consistency: Nectar Thick  Diet effective now                    Intake/Output Summary (Last 24 hours) at 12/14/2021 0954 Last data filed at 12/14/2021 0857 Gross per 24 hour  Intake 1275.15 ml  Output 1000 ml  Net 275.15 ml   Net IO Since Admission: 11,076.03 mL [12/14/21 0954]  Wt Readings from Last 3 Encounters:  12/13/21 59.8 kg  11/23/21 51.5 kg  11/17/21 51.7 kg     Unresulted Labs (From admission, onward)     Start     Ordered   12/15/21 0500  CBC with Differential/Platelet  Daily at 5am,   R     Question:  Specimen collection method  Answer:  IV Team=IV Team collect   12/14/21 0645   12/14/21 0645  CBC with Differential/Platelet  ONCE - URGENT,   URGENT       Question:  Specimen collection method  Answer:  IV Team=IV Team collect   12/14/21 0644   12/14/21 3329  Basic metabolic panel  Daily at 5am,   R     Question:  Specimen collection method  Answer:  IV Team=IV Team collect   12/13/21 1017   12/13/21 0735  CBC with Differential/Platelet  Add-on,   AD       Question:  Specimen collection method  Answer:  IV Team=IV Team collect   12/13/21 0734   12/06/21 1822  Acid Fast Culture with reflexed sensitivities  (AFB smear + Culture w reflexed sensitivities panel)  Once,   R       See Hyperspace for full Linked Orders Report.   12/06/21 1821   12/05/21 0000  Type and screen  R       Comments: [facility]    12/05/21 2206          Data  Reviewed: I have personally reviewed following labs and imaging studies CBC: Recent Labs  Lab 12/09/21 1200 12/10/21 0440 12/11/21 0451 12/12/21 0226 12/13/21 0253  WBC 1.4* 1.3* 1.6* 1.5* 2.0*  NEUTROABS 0.4*  --   --   --  0.3*  HGB 7.7* 7.6* 9.1* 8.8* 8.9*  HCT 23.8* 23.4* 27.9* 27.1* 27.0*  MCV 94.4 92.5 93.0 91.9 91.5  PLT 25* 11* 22* 13* 24*   Basic Metabolic Panel: Recent Labs  Lab 12/07/21 1112 12/08/21 0509 12/09/21 0413 12/09/21 1200 12/10/21 0440 12/11/21 0451 12/12/21 0226 12/13/21 0253 12/14/21 0408  NA 138 141 145   < > 151* 145 140 138 135  K 3.8 3.5 5.7*   < > 3.4* 3.6 3.4* 3.6 3.8  CL 105 110 115*   < > 116* 110 103 103 102  CO2 _0 < > _1 GLUCOSE 324* 363* 185*   < > 175* 354* 172* 135* 72  BUN 42* 56* 60*   < > 42* 30* _2 CREATININE 0.81 0.62 0.46   < > 0.32* 0.40* 0.37* <0.30* 0.38*  CALCIUM 7.7* 8.2* 8.5*   < > 8.7* 8.6* 8.5* 8.6* 8.6*  MG 2.0 2.7* 2.7*  --  2.1  --   --   --   --   PHOS  --  2.3* 3.9  --  3.5  --   --   --   --    < > = values in this interval not displayed.   Recent Results (from the past 240 hour(s))  Culture, blood (Routine X 2) w Reflex to ID Panel     Status: None   Collection Time: 12/05/21 11:45 AM   Specimen: BLOOD  Result Value Ref Range Status   Specimen Description   Final    BLOOD SITE NOT SPECIFIED Performed at [facility] [address], [place], [place] [address]    Special Requests   Final    BOTTLES DRAWN AEROBIC AND ANAEROBIC Blood Culture adequate volume Performed at [facility] [address] [place], [place] [address]    Culture   Final    NO GROWTH 5 DAYS Performed at [facility], [place] [address] [place], [place] [address]    Report Status 12/10/2021 FINAL  Final  Culture, blood (Routine X 2) w Reflex to ID Panel     Status: None   Collection Time: 12/05/21 11:45 AM   Specimen: BLOOD  Result Value Ref Range Status    Specimen Description   Final    BLOOD SITE NOT SPECIFIED Performed at [facility] [address], [place], [place] [address]    Special Requests   Final    BOTTLES DRAWN AEROBIC AND ANAEROBIC Blood Culture adequate volume Performed at [facility] [address], [place], [place] [address]    Culture   Final    NO GROWTH 5 DAYS Performed at [facility], [place] [address], [place], [place] [address]    Report Status 12/10/2021 FINAL  Final  Expectorated Sputum Assessment w Gram Stain, Rflx to Resp Cult     Status: None   Collection Time: 12/05/21  6:35 PM   Specimen: Sputum  Result Value Ref Range Status   Specimen Description   Final    SPU Performed at [facility] [address], [place], [place] [address]    Special Requests   Final    NONE Performed at [facility], [place] [address] [place], [place] [address]    Sputum evaluation THIS SPECIMEN IS ACCEPTABLE FOR SPUTUM CULTURE  Final   Report Status 12/05/2021 FINAL  Final  Culture, Respiratory w Gram Stain     Status: None   Collection Time: 12/05/21  6:35 PM   Specimen: Sputum  Result Value Ref Range Status   Specimen Description   Final    SPU Performed at [facility] [address] [place], [place] [address]    Special Requests   Final    NONE Reflexed from [id number] Performed at [facility], [place] [address], [place], [place] [address]    Gram Stain   Final    FEW WBC PRESENT, PREDOMINANTLY MONONUCLEAR FEW SQUAMOUS EPITHELIAL CELLS PRESENT FEW GRAM VARIABLE ROD FEW BUDDING YEAST SEEN    Culture   Final    FEW Normal respiratory flora-no Staph aureus or Pseudomonas seen Performed at [facility], [address] [place], [place] [address]    Report Status 12/08/2021 FINAL  Final  Culture, Respiratory w Gram Stain     Status: None   Collection Time: 12/06/21  6:15 PM   Specimen:  Bronchoalveolar Lavage; Respiratory  Result Value Ref Range Status   Specimen Description   Final    BRONCHIAL ALVEOLAR LAVAGE Performed at [facility] [facility] [address], [place], [place] [address]    Special Requests   Final    NONE Performed at [facility], [place] [address], [place], [place] [address]    Gram Stain   Final    FEW WBC PRESENT, PREDOMINANTLY MONONUCLEAR NO ORGANISMS SEEN    Culture   Final    NO GROWTH 2 DAYS Performed at [facility] [address], [place], [place] [address]    Report Status 12/09/2021 FINAL  Final  Fungus Culture With Stain     Status: None (Preliminary result)   Collection Time: 12/06/21  6:15 PM   Specimen: Bronchial Alveolar Lavage  Result Value Ref Range Status   Fungus Stain Final report  Final    Comment: (NOTE) Performed At: [facility] [place], [place] [id number] Rush Farmer MD [id number]    Fungus (Mycology) Culture PENDING  Incomplete   Fungal Source BRONCHIAL ALVEOLAR LAVAGE  Final    Comment: Performed at [facility], [place] [address] [place], [place] [address]  Acid Fast Smear (AFB)     Status: None   Collection Time: 12/06/21  6:15 PM   Specimen: Bronchoalveolar Lavage; Respiratory  Result Value Ref Range Status   AFB Specimen Processing Concentration  Final   Acid Fast Smear Negative  Final    Comment: (NOTE) Performed At: [facility] [place], [place] [id number] Rush Farmer MD [id number]    Source (AFB) BRONCHIAL ALVEOLAR LAVAGE  Final    Comment: Performed at [facility] [address] [place], [place] [address]  Fungus Culture Result     Status: None   Collection Time: 12/06/21  6:15 PM  Result Value Ref Range Status   Result 1 Comment  Final    Comment: (NOTE) KOH/Calcofluor preparation:  no fungus observed. Performed At: [facility] [place], [place] [id number] Rush Farmer MD [id number]   Aspergillus Ag, BAL/Serum     Status: None   Collection Time: 12/07/21  8:36 AM   Specimen: Bronchial Alveolar Lavage; Respiratory  Result Value Ref Range Status   Aspergillus Ag, BAL/Serum 0.07 0.00 - 0.49 Index Final    Comment: (NOTE) Performed At: [facility] [place], [place] [id number] Rush Farmer MD  [id number]:[id number]   Culture, Respiratory w Gram Stain     Status: None   Collection Time: 12/08/21 11:32 AM   Specimen: Bronchoalveolar Lavage; Respiratory  Result Value Ref Range Status   Specimen Description   Final    BRONCHIAL ALVEOLAR LAVAGE Performed at [facility] [address] [place], [place] [address]    Special Requests   Final    NONE Performed at [facility], [place] [address], [place], [place] [address]    Gram Stain NO WBC SEEN NO ORGANISMS SEEN   Final   Culture   Final    NO GROWTH 2 DAYS Performed at [facility], [place] [address], [place], [place] [address]    Report Status 12/10/2021 FINAL  Final  Pneumocystis smear by DFA     Status: None   Collection Time: 12/08/21 11:32 AM   Specimen: Bronchoalveolar Lavage; Respiratory  Result Value Ref Range Status   Specimen Source-PJSRC BRONCHIAL ALVEOLAR LAVAGE  Final   Pneumocystis jiroveci Ag NEGATIVE  Final    Comment: Performed at [facility] Performed at [facility] [address] [place], [place] [address]     Antimicrobials: Anti-infectives (From admission, onward)    Start     Dose/Rate Route Frequency Ordered Stop   12/13/21 1000  sulfamethoxazole-trimethoprim (BACTRIM DS) 800-160 MG per tablet 1 tablet        1 tablet Oral Once per day on Mon Wed Fri 12/11/21 1227     12/12/21 1500  vancomycin (VANCOREADY) IVPB 1250 mg/250 mL  Status:  Discontinued        1,250 mg 166.7 mL/hr over 90 Minutes Intravenous Every 12 hours 12/12/21 1308  12/13/21 1306   12/09/21 1400  vancomycin (VANCOCIN) IVPB 1000 mg/200 mL premix  Status:  Discontinued        1,000 mg 200 mL/hr over 60 Minutes Intravenous Every 12 hours 12/09/21 1351 12/12/21 1308   12/08/21 1000  voriconazole (VFEND) 200 mg in sodium chloride 0.9 % 100 mL IVPB  Status:  Discontinued       See Hyperspace for full Linked Orders Report.   200 mg 60 mL/hr over 120 Minutes Intravenous Every 12 hours 12/07/21 0841 12/08/21 0836   12/08/21 1000  micafungin (MYCAMINE) 100 mg in sodium chloride 0.9 % 100 mL IVPB        100 mg 105 mL/hr over 1 Hours Intravenous Every 24 hours 12/08/21 0836     12/07/21 2330  vancomycin (VANCOREADY) IVPB 750 mg/150 mL  Status:  Discontinued        750 mg 150 mL/hr over 60 Minutes Intravenous Every 24 hours 12/07/21 0000 12/09/21 1351   12/07/21 1000  voriconazole (VFEND) 300 mg in sodium chloride 0.9 % 100 mL IVPB       See Hyperspace for full Linked Orders Report.   300 mg 65 mL/hr over 120 Minutes Intravenous Every 12 hours 12/07/21 0841 12/08/21 0007   12/07/21 0630  meropenem (MERREM) 1 g in sodium chloride 0.9 % 100 mL IVPB        1 g 200 mL/hr over 30 Minutes Intravenous Every 8 hours 12/06/21 2202     12/06/21 2000  micafungin (MYCAMINE) 100 mg in sodium chloride 0.9 % 100 mL IVPB  Status:  Discontinued        100 mg 105 mL/hr over 1 Hours Intravenous Every 24 hours 12/06/21 1838 12/07/21 0841   12/05/21 1630  meropenem (  MERREM) 1 g in sodium chloride 0.9 % 100 mL IVPB  Status:  Discontinued        1 g 200 mL/hr over 30 Minutes Intravenous Every 12 hours 12/05/21 1521 12/05/21 1549   12/05/21 1630  vancomycin (VANCOREADY) IVPB 750 mg/150 mL  Status:  Discontinued        750 mg 150 mL/hr over 60 Minutes Intravenous Every 24 hours 12/05/21 1521 12/07/21 0000   12/05/21 1630  meropenem (MERREM) 1 g in sodium chloride 0.9 % 100 mL IVPB  Status:  Discontinued        1 g 200 mL/hr over 30 Minutes Intravenous Every 8 hours 12/05/21 1549  12/06/21 2202   12/03/21 0800  vancomycin (VANCOREADY) IVPB 750 mg/150 mL  Status:  Discontinued        750 mg 150 mL/hr over 60 Minutes Intravenous Every 24 hours 12/02/21 0923 12/04/21 1041   12/02/21 1045  metroNIDAZOLE (FLAGYL) IVPB 500 mg  Status:  Discontinued        500 mg 100 mL/hr over 60 Minutes Intravenous Every 12 hours 12/02/21 1021 12/05/21 1515   12/02/21 1000  ceFEPIme (MAXIPIME) 2 g in sodium chloride 0.9 % 100 mL IVPB  Status:  Discontinued        2 g 200 mL/hr over 30 Minutes Intravenous Every 12 hours 12/02/21 0923 12/05/21 1515   12/02/21 0800  vancomycin (VANCOCIN) IVPB 1000 mg/200 mL premix        1,000 mg 200 mL/hr over 60 Minutes Intravenous  Once 12/02/21 0754 12/02/21 0913   12/01/21 2330  ceFEPIme (MAXIPIME) 2 g in sodium chloride 0.9 % 100 mL IVPB        2 g 200 mL/hr over 30 Minutes Intravenous  Once 12/01/21 2315 12/01/21 2352   12/01/21 2330  azithromycin (ZITHROMAX) 500 mg in sodium chloride 0.9 % 250 mL IVPB        500 mg 250 mL/hr over 60 Minutes Intravenous  Once 12/01/21 2315 12/02/21 0026      Culture/Microbiology    Component Value Date/Time   SDES  12/08/2021 1132    BRONCHIAL ALVEOLAR LAVAGE Performed at [facility], [place] [address], [place], [place] [address]    SPECREQUEST  12/08/2021 1132    NONE Performed at [facility], [place] [address] [place], [place] [address]    CULT  12/08/2021 1132    NO GROWTH 2 DAYS Performed at [facility] [address], [place], [place] [address]    REPTSTATUS 12/10/2021 FINAL 12/08/2021 1132  Radiology Studies: No results found.   LOS: 12 days   Antonieta Pert, MD [facility]  12/14/2021, 9:54 AM

## 2021-12-14 NOTE — Care Management Important Message (Signed)
Important Message  Patient Details IM Letter given. Name: ASANI DENISTON MRN: [id number] Date of Birth: 1955/08/17   Medicare Important Message Given:  Yes     Kerin Salen 12/14/2021, 11:53 AM
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
## 2021-12-15 ENCOUNTER — Other Ambulatory Visit: Payer: Self-pay

## 2021-12-15 ENCOUNTER — Ambulatory Visit: Payer: Medicare Other

## 2021-12-15 DIAGNOSIS — C50212 Malignant neoplasm of upper-inner quadrant of left female breast: Secondary | ICD-10-CM

## 2021-12-15 DIAGNOSIS — R5081 Fever presenting with conditions classified elsewhere: Secondary | ICD-10-CM | POA: Diagnosis not present

## 2021-12-15 DIAGNOSIS — D61818 Other pancytopenia: Secondary | ICD-10-CM | POA: Diagnosis not present

## 2021-12-15 DIAGNOSIS — D649 Anemia, unspecified: Secondary | ICD-10-CM

## 2021-12-15 DIAGNOSIS — D709 Neutropenia, unspecified: Secondary | ICD-10-CM | POA: Diagnosis not present

## 2021-12-15 LAB — CBC WITH DIFFERENTIAL/PLATELET
Abs Immature Granulocytes: 0.04 10*3/uL (ref 0.00–0.07)
Basophils Absolute: 0 10*3/uL (ref 0.0–0.1)
Basophils Relative: 0 %
Eosinophils Absolute: 0 10*3/uL (ref 0.0–0.5)
Eosinophils Relative: 0 %
HCT: 22.9 % — ABNORMAL LOW (ref 36.0–46.0)
Hemoglobin: 7.8 g/dL — ABNORMAL LOW (ref 12.0–15.0)
Immature Granulocytes: 1 %
Lymphocytes Relative: 79 %
Lymphs Abs: 2.2 10*3/uL (ref 0.7–4.0)
MCH: 30.4 pg (ref 26.0–34.0)
MCHC: 34.1 g/dL (ref 30.0–36.0)
MCV: 89.1 fL (ref 80.0–100.0)
Monocytes Absolute: 0.1 10*3/uL (ref 0.1–1.0)
Monocytes Relative: 2 %
Neutro Abs: 0.5 10*3/uL — ABNORMAL LOW (ref 1.7–7.7)
Neutrophils Relative %: 18 %
Platelets: 9 10*3/uL — CL (ref 150–400)
RBC: 2.57 MIL/uL — ABNORMAL LOW (ref 3.87–5.11)
RDW: 14.6 % (ref 11.5–15.5)
WBC: 2.8 10*3/uL — ABNORMAL LOW (ref 4.0–10.5)
nRBC: 0.7 % — ABNORMAL HIGH (ref 0.0–0.2)

## 2021-12-15 LAB — GLUCOSE, CAPILLARY
Glucose-Capillary: 79 mg/dL (ref 70–99)
Glucose-Capillary: 195 mg/dL — ABNORMAL HIGH (ref 70–99)
Glucose-Capillary: 259 mg/dL — ABNORMAL HIGH (ref 70–99)
Glucose-Capillary: 425 mg/dL — ABNORMAL HIGH (ref 70–99)
Glucose-Capillary: 435 mg/dL — ABNORMAL HIGH (ref 70–99)
Glucose-Capillary: 337 mg/dL — ABNORMAL HIGH (ref 70–99)
Glucose-Capillary: 273 mg/dL — ABNORMAL HIGH (ref 70–99)

## 2021-12-15 LAB — MISC LABCORP TEST (SEND OUT): Labcorp test code: 832599

## 2021-12-15 LAB — BASIC METABOLIC PANEL
Anion gap: 5 (ref 5–15)
BUN: 17 mg/dL (ref 8–23)
CO2: 28 mmol/L (ref 22–32)
Calcium: 8.4 mg/dL — ABNORMAL LOW (ref 8.9–10.3)
Chloride: 104 mmol/L (ref 98–111)
Creatinine, Ser: 0.33 mg/dL — ABNORMAL LOW (ref 0.44–1.00)
GFR, Estimated: >60 mL/min (ref >60)
Glucose, Bld: 129 mg/dL — ABNORMAL HIGH (ref 70–99)
Potassium: 3.5 mmol/L (ref 3.5–5.1)
Sodium: 137 mmol/L (ref 135–145)

## 2021-12-15 MED ORDER — SODIUM CHLORIDE 0.9% IV SOLUTION: Freq: Once | INTRAVENOUS | Status: DC

## 2021-12-15 MED ORDER — LIDOCAINE 5 % EX PTCH
1.0000 | MEDICATED_PATCH | CUTANEOUS | Status: DC
  Administered 2021-12-15 – 2021-12-20 (×3): 1 via TRANSDERMAL
  Filled 2021-12-15 (×8): qty 1

## 2021-12-15 MED ORDER — AMLODIPINE BESYLATE 5 MG PO TABS: 5.0000 mg | ORAL_TABLET | Freq: Every day | ORAL | Status: DC

## 2021-12-15 MED ORDER — HYDRALAZINE HCL 25 MG PO TABS: 25.0000 mg | ORAL_TABLET | Freq: Four times a day (QID) | ORAL | Status: DC | PRN

## 2021-12-15 MED ORDER — ACETAMINOPHEN 325 MG PO TABS
650.0000 mg | ORAL_TABLET | Freq: Once | ORAL | Status: AC
  Administered 2021-12-15: 650 mg via ORAL
  Filled 2021-12-15: qty 2

## 2021-12-15 MED ORDER — INSULIN ASPART 100 UNIT/ML IJ SOLN
6.0000 [IU] | Freq: Three times a day (TID) | INTRAMUSCULAR | Status: DC
  Administered 2021-12-15: 6 [IU] via SUBCUTANEOUS

## 2021-12-15 MED ORDER — AMLODIPINE BESYLATE 5 MG PO TABS
2.5000 mg | ORAL_TABLET | Freq: Every day | ORAL | Status: DC
  Administered 2021-12-15: 2.5 mg via ORAL
  Filled 2021-12-15: qty 1

## 2021-12-15 MED ORDER — LOSARTAN POTASSIUM 50 MG PO TABS
50.0000 mg | ORAL_TABLET | Freq: Every day | ORAL | Status: DC
  Administered 2021-12-15 – 2021-12-22 (×8): 50 mg via ORAL
  Filled 2021-12-15 (×8): qty 1

## 2021-12-15 NOTE — Progress Notes (Signed)
Pharmacy Antibiotic Note  Amanda Duarte is a 66 y.o. female admitted on 12/01/2021 with pneumonia.  Pharmacy has been consulted for Merrem dosing.  ID: PNA, Neutropenic fever -WBC 2.8, Afebrile, Scr <1  11/8 azithro x1 11/8 cefepime>>11/12 11/9 vanc>>11/11, resume 11/12 >>11/20 11/9 Flagyl>>11/12 11/12 Meropenem >> 11/13 Micafungin x 1, 11/15 >> Septra DS TIW for PJP prophx 11/14 Voriconazole >> 11/15  11/15 PJP negative  11/13 Resp cx (BAL):negative  11/12 Resp cx (sputum): NDF 11/12 Bcx: ngtd 11/8 Covid, Influenza A/B: neg, neg 11/8 BCx x2: NGF 11/9 UCx: multi species, suggest recollection FINAL 11/9 MRSA pcr: neg 11/8 resp panel pcr: neg 11/12 serum fungitell:  11/19: Vancomycin Peak: 33 ug/mL, subtherapeutic  Vancomycin Random: 8 ug/mL,  eAUC of 448, Increase Vancomycin dose to 1243m q12 hours   Plan: Vanco d/c'd -Meropenem 1g IV q8h -Voriconazole>> micafungin 1082m24h for QTc prolongation    Height: 5' (152.4 cm) Weight: 60.1 kg (132 lb 7.9 oz) IBW/kg (Calculated) : 45.5  Temp (24hrs), Avg:98.1 F (36.7 C), Min:97.4 F (36.3 C), Max:98.7 F (37.1 C)  Recent Labs  Lab 12/09/21 0154 12/09/21 0413 12/09/21 1200 12/10/21 0440 12/11/21 0451 12/11/21 1531 12/12/21 0226 12/13/21 0253 12/14/21 0408 12/14/21 0929 12/15/21 0311  WBC  --    < > 1.4*   < > 1.6*  --  1.5* 2.0*  --  3.1* 2.8*  CREATININE  --    < > 0.56   < > 0.40*  --  0.37* <0.30* 0.38*  --  0.33*  VANCOTROUGH  --   --   --   --   --  27* 8*  --   --   --   --   VANCOPEAK 20*  --   --   --  33  --   --   --   --   --   --   VANCORANDOM  --   --  4  --   --   --   --   --   --   --   --    < > = values in this interval not displayed.    Estimated Creatinine Clearance: 56 mL/min (A) (by C-G formula based on SCr of 0.33 mg/dL (L)).    Allergies  Allergen Reactions   Victoza [Liraglutide] Nausea And Vomiting    Kayti Poss S. RoAlford HighlandPharmD, BCPS Clinical Staff  Pharmacist [email or web address]  RoWayland Salinas1/22/2023 7:26 AM

## 2021-12-15 NOTE — Inpatient Diabetes Management (Signed)
Inpatient Diabetes Program Recommendations  AACE/ADA: New Consensus Statement on Inpatient Glycemic Control (2015)  Target Ranges:  Prepandial:   less than 140 mg/dL      Peak postprandial:   less than 180 mg/dL (1-2 hours)      Critically ill patients:  140 - 180 mg/dL   Lab Results  Component Value Date   GLUCAP 79 12/15/2021   HGBA1C 11.4 (A) 10/28/2021    Review of Glycemic Control  Latest Reference Range & Units 12/14/21 07:25 12/14/21 11:55 12/14/21 15:56 12/14/21 20:20 12/15/21 07:17  Glucose-Capillary 70 - 99 mg/dL 75 152 (H) 185 (H) 336 (H) 79  (H): Data is abnormally high  Diabetes history: DM2 Outpatient Diabetes medications: Lantus 25 units QD, Humalog 4 units QD, Amaryl 4 mg BID, Janumet 50-1000 mg BID Current orders for Inpatient glycemic control: Levemir 20 units BID, Novolog 0-20 units TID and 0-5 units QHS, Prednisone taper  Inpatient Diabetes Program Recommendations:    Fasting CBG's 75 yesterday and 79 today.  Please consider:  Levemir 15 units BID Increase meal coverage to Novolog 8 units TID if consumes at least 50%  Will continue to follow while inpatient.  Thank you, Reche Dixon, MSN, [place] Diabetes Coordinator Inpatient Diabetes Program [phone] (team pager from 8a-5p)

## 2021-12-15 NOTE — Progress Notes (Signed)
Nutrition Follow-up  DOCUMENTATION CODES:   Non-severe (moderate) malnutrition in context of chronic illness  INTERVENTION:   -Glucerna Shake po TID, each supplement provides 220 kcal and 10 grams of protein   -Magic cup BID with meals, each supplement provides 290 kcal and 9 grams of protein   -D/c Boost Breeze and Ensure Plus  -Multivitamin with minerals daily  NUTRITION DIAGNOSIS:   Moderate Malnutrition related to chronic illness as evidenced by energy intake < or equal to 75% for > or equal to 1 month, mild fat depletion, mild muscle depletion.  Ongoing.  GOAL:   Patient will meet greater than or equal to 90% of their needs  Progressing.  MONITOR:   PO intake, Supplement acceptance, Labs, Weight trends   ASSESSMENT:   Pt is a 66yo F with PMH of HTN, HLD, anemia, anxiety/depression, DM2, and breast cancer who presents with fever due to sepsis.  Patient currently consuming 100% of meals at this time. Was ordered Ensure, Boost Breeze and Glucerna shakes. Will d/c Boost and Ensure as pt drinks Glucerna at home.   Admission weight: 116 lbs Current weight: 132 lbs  Medications: Miralax, Prednisone  Labs reviewed: CBGs: 75-336   Diet Order:   Diet Order             Diet Carb Modified Fluid consistency: Thin; Room service appropriate? Yes  Diet effective now                   EDUCATION NEEDS:   Not appropriate for education at this time  Skin:  Skin Assessment: Reviewed RN Assessment  Last BM:  11/22-type 7  Height:   Ht Readings from Last 1 Encounters:  12/02/21 5' (1.524 m)    Weight:   Wt Readings from Last 1 Encounters:  12/15/21 60.1 kg   BMI:  Body mass index is 25.88 kg/m.  Estimated Nutritional Needs:   Kcal:  1700-1900 kcal  Protein:  85-100 grams  Fluid:  >/= 1.8 L/day  Amanda Bibles, MS, RD, LDN Inpatient Clinical Dietitian Contact information available via [employer]

## 2021-12-15 NOTE — Progress Notes (Addendum)
[facility] Infectious Diseases Follow Up Note  Patient Identification: Patient Name: PORCHA DEBLANC MRN: [id number] [place] Date: 12/01/2021 10:47 PM Age: 66 y.o.Today's Date: 12/15/2021  Reason for Visit: neutropenic fevers, pancytopenia, PNA   Principal Problem:   Sepsis with acute organ dysfunction without septic shock (HCC) Active Problems:   Essential hypertension   DM2 (diabetes mellitus, type 2) (HCC)   Malignant neoplasm of upper-inner quadrant of left breast in female, estrogen receptor positive (HCC)   HLD (hyperlipidemia)   Pancytopenia (HCC)   Hyponatremia   Hypokalemia   MDS (myelodysplastic syndrome), high grade (HCC)   GERD (gastroesophageal reflux disease)   Acute respiratory failure with hypoxia (HCC)   On mechanically assisted ventilation (HCC)   Acute respiratory distress syndrome (ARDS) (HCC)   Septic shock (HCC)   Lobar pneumonia, unspecified organism (HCC)   Malnutrition of moderate degree   Aspiration pneumonia of left lower lobe due to gastric secretions (HCC)   Diffuse pulmonary alveolar hemorrhage   Neutropenic fever (HCC)   Current Antibiotics/Antifungals  Meropenem  Micafungin   Lines/Hardwares: PICC in rt arm   Pertinent Microbiology  11/15 BAL Cx routine cx/ gram stain No growth, PJP ag negative , BAL  galactomannan negative  11/13 BAL fungal stain negative, Cx pending,  11/13 BAL AFB smear negative, Cx pending.  11/13 Histoplasma ag negative in urine  12/05/21 Fungitell Less than 31.25 11/12 resp cultures with GVR, budding yeast Cx NRF 11/12 blood cx 2/2 sets NG  11/9 MRSA PCR negative  11/8 RVP negative 11/8 blood cx 2/2 sets NG    Interval Events: afebrile for 5 days now . Overall clinically improving with negative infective work up   Assessment 66 year old female with PMH of breast cancer now with MDS with severe anemia and thrombocytopenia status post chemotherapy with azacitidine  at the end of October who presented to the ED with 2-day history of weakness, fatigue progressing to fevers and confusion. Chest X-ray showing multifocal pneumonia. BAL 11/13 and 11/15 ( bloody return persisted in all 3 aliquots, no real change in color). Cx as above. Also on prolonged tapered steroids due to concerns for [facility] by PCCM   Recommendations DC meropenem and micafungin, no clear infective bacterial and fungal cause found. Her Neosho also has recovered to more than 500.  Would monitor off abtx for at least 24 hrs  Streoids taper per primary/Pulm Bactrim ppx while on steroids  Management of  Pancytopenia/thromboyctopenia  per Oncology and Primary  ID will sign off.   Rest of the management as per the primary team. Thank you for the consult. Please page with pertinent questions or concerns.  ______________________________________________________________________ Subjective patient seen and examined at the bedside. She is doing well with no concerns. She has worked with PT and OT. I spoke with her daughter over phone who also states her mother is doing well, only thing she is concerned about is her low platelets count.   Vitals BP (!) 195/66 (BP Location: Right Leg)   Pulse 83   Temp (!) 97.4 F (36.3 C) (Oral)   Resp 20   Ht 5' (1.524 m)   Wt 60.1 kg   SpO2 98%   BMI 25.88 kg/m      Physical Exam Constitutional:  sitting up in the bed and appears comfortable    Comments:   Cardiovascular:     Rate and Rhythm: Normal rate and regular rhythm.     Heart sounds:  Pulmonary:     Effort: Pulmonary effort  is normal on room air     Comments:   Abdominal:     Palpations: Abdomen is soft.     Tenderness: non distended and non tender   Musculoskeletal:        General: No swelling or tenderness in peripheral joints  Skin:    Comments: no obvious rashes,   Neurological:     General: Non focal, awake, alert and oriented   Psychiatric:        Mood and Affect: Mood normal.    Pertinent Microbiology Results for orders placed or performed during the hospital encounter of 12/01/21  Blood Culture (routine x 2)     Status: None   Collection Time: 12/01/21 11:13 PM   Specimen: BLOOD  Result Value Ref Range Status   Specimen Description   Final    BLOOD RIGHT ANTECUBITAL Performed at [facility] [address], [place], [place] [address]    Special Requests   Final    BOTTLES DRAWN AEROBIC AND ANAEROBIC Blood Culture results may not be optimal due to an excessive volume of blood received in culture bottles Performed at [facility] [address] [place], [place] [address]    Culture   Final    NO GROWTH 5 DAYS Performed at [facility], [place] [address] [place], [place] [address]    Report Status 12/07/2021 FINAL  Final  Blood Culture (routine x 2)     Status: None   Collection Time: 12/01/21 11:13 PM   Specimen: BLOOD  Result Value Ref Range Status   Specimen Description   Final    BLOOD SITE NOT SPECIFIED Performed at [facility] [address], [place], [place] [address]    Special Requests   Final    NONE Performed at [facility], [place] [address] [place], [place] [address]    Culture   Final    NO GROWTH 5 DAYS Performed at [facility], [place] [address], [place],  [address]    Report Status 12/07/2021 FINAL  Final  Resp Panel by RT-PCR (Flu A&B, Covid) Anterior Nasal Swab     Status: None   Collection Time: 12/01/21 11:25 PM   Specimen: Anterior Nasal Swab  Result Value Ref Range Status   SARS Coronavirus 2 by RT PCR NEGATIVE NEGATIVE Final    Comment: (NOTE) SARS-CoV-2 target nucleic acids are NOT DETECTED.  The SARS-CoV-2 RNA is generally detectable in upper respiratory specimens during the acute phase of infection. The lowest concentration of SARS-CoV-2 viral copies this assay can detect is 138 copies/mL. A negative result does not  preclude SARS-Cov-2 infection and should not be used as the sole basis for treatment or other patient management decisions. A negative result may occur with  improper specimen collection/handling, submission of specimen other than nasopharyngeal swab, presence of viral mutation(s) within the areas targeted by this assay, and inadequate number of viral copies(<138 copies/mL). A negative result must be combined with clinical observations, patient history, and epidemiological information. The expected result is Negative.  Fact Sheet for Patients:  EntrepreneurPulse.com.au  Fact Sheet for Healthcare Providers:  IncredibleEmployment.be  This test is no t yet approved or cleared by the [place] FDA and  has been authorized for detection and/or diagnosis of SARS-CoV-2 by FDA under an Emergency Use Authorization (EUA). This EUA will remain  in effect (meaning this test can be used) for the duration of the COVID-19 declaration under Section 564(b)(1)  of the Act, 21 U.S.C.section 360bbb-3(b)(1), unless the authorization is terminated  or revoked sooner.       Influenza A by PCR NEGATIVE NEGATIVE Final   Influenza B by PCR NEGATIVE NEGATIVE Final    Comment: (NOTE) The Xpert Xpress SARS-CoV-2/FLU/RSV plus assay is intended as an aid in the diagnosis of influenza from Nasopharyngeal swab specimens and should not be used as a sole basis for treatment. Nasal washings and aspirates are unacceptable for Xpert Xpress SARS-CoV-2/FLU/RSV testing.  Fact Sheet for Patients: EntrepreneurPulse.com.au  Fact Sheet for Healthcare Providers: IncredibleEmployment.be  This test is not yet approved or cleared by the [place] FDA and has been authorized for detection and/or diagnosis of SARS-CoV-2 by FDA under an Emergency Use Authorization (EUA). This EUA will remain in effect (meaning this test can be used) for the  duration of the COVID-19 declaration under Section 564(b)(1) of the Act, 21 U.S.C. section 360bbb-3(b)(1), unless the authorization is terminated or revoked.  Performed at [facility], [place] [address] [place], [place] [address]   Respiratory (~20 pathogens) panel by PCR     Status: None   Collection Time: 12/01/21 11:25 PM   Specimen: Nasopharyngeal Swab; Respiratory  Result Value Ref Range Status   Adenovirus NOT DETECTED NOT DETECTED Final   Coronavirus 229E NOT DETECTED NOT DETECTED Final    Comment: (NOTE) The Coronavirus on the Respiratory Panel, DOES NOT test for the novel  Coronavirus (2019 nCoV)    Coronavirus HKU1 NOT DETECTED NOT DETECTED Final   Coronavirus NL63 NOT DETECTED NOT DETECTED Final   Coronavirus OC43 NOT DETECTED NOT DETECTED Final   Metapneumovirus NOT DETECTED NOT DETECTED Final   Rhinovirus / Enterovirus NOT DETECTED NOT DETECTED Final   Influenza A NOT DETECTED NOT DETECTED Final   Influenza B NOT DETECTED NOT DETECTED Final   Parainfluenza Virus 1 NOT DETECTED NOT DETECTED Final   Parainfluenza Virus 2 NOT DETECTED NOT DETECTED Final   Parainfluenza Virus 3 NOT DETECTED NOT DETECTED Final   Parainfluenza Virus 4 NOT DETECTED NOT DETECTED Final   Respiratory Syncytial Virus NOT DETECTED NOT DETECTED Final   Bordetella pertussis NOT DETECTED NOT DETECTED Final   Bordetella Parapertussis NOT DETECTED NOT DETECTED Final   Chlamydophila pneumoniae NOT DETECTED NOT DETECTED Final   Mycoplasma pneumoniae NOT DETECTED NOT DETECTED Final    Comment: Performed at [facility], [place]. [address], [place], [place] [address]  Urine Culture     Status: Abnormal   Collection Time: 12/02/21  3:00 AM   Specimen: In/Out Cath Urine  Result Value Ref Range Status   Specimen Description   Final    IN/OUT CATH URINE Performed at [facility] [address] [place], [place] [address]    Special Requests   Final     NONE Performed at [facility], [place] [address], [place], [place] [address]    Culture MULTIPLE SPECIES PRESENT, SUGGEST RECOLLECTION (A)  Final   Report Status 12/03/2021 FINAL  Final  MRSA Next Gen by PCR, Nasal     Status: None   Collection Time: 12/02/21  1:04 PM   Specimen: Nasal Mucosa; Nasal Swab  Result Value Ref Range Status   MRSA by PCR Next Gen NOT DETECTED NOT DETECTED Final    Comment: (NOTE) The GeneXpert MRSA Assay (FDA approved for NASAL specimens only), is one component of a comprehensive MRSA colonization surveillance program. It is not intended to diagnose MRSA infection nor to  guide or monitor treatment for MRSA infections. Test performance is not FDA approved in patients less than 70 years old. Performed at [facility], [place] [address] [place], [place] [address]   Culture, blood (Routine X 2) w Reflex to ID Panel     Status: None   Collection Time: 12/05/21 11:45 AM   Specimen: BLOOD  Result Value Ref Range Status   Specimen Description   Final    BLOOD SITE NOT SPECIFIED Performed at [facility] [address] [place], [place] [address]    Special Requests   Final    BOTTLES DRAWN AEROBIC AND ANAEROBIC Blood Culture adequate volume Performed at [facility] [address] [place], [place] [address]    Culture   Final    NO GROWTH 5 DAYS Performed at [facility], [place] [address], [place], [place] [address]    Report Status 12/10/2021 FINAL  Final  Culture, blood (Routine X 2) w Reflex to ID Panel     Status: None   Collection Time: 12/05/21 11:45 AM   Specimen: BLOOD  Result Value Ref Range Status   Specimen Description   Final    BLOOD SITE NOT SPECIFIED Performed at [facility] [address] [place], [place] [address]    Special Requests   Final    BOTTLES DRAWN AEROBIC AND ANAEROBIC Blood Culture adequate volume Performed at [facility] [address] [place], [place] [address]    Culture   Final    NO GROWTH 5 DAYS Performed at [facility], [place] [address], [place], [place] [address]    Report Status 12/10/2021 FINAL  Final  Expectorated Sputum Assessment w Gram Stain, Rflx to Resp Cult     Status: None   Collection Time: 12/05/21  6:35 PM   Specimen: Sputum  Result Value Ref Range Status   Specimen Description   Final    SPU Performed at [facility] [address] [place], [place] [address]    Special Requests   Final    NONE Performed at [facility], [place] [address] [place], [place] [address]    Sputum evaluation THIS SPECIMEN IS ACCEPTABLE FOR SPUTUM CULTURE  Final   Report Status 12/05/2021 FINAL  Final  Culture, Respiratory w Gram Stain     Status: None   Collection Time: 12/05/21  6:35 PM   Specimen: Sputum  Result Value Ref Range Status   Specimen Description   Final    SPU Performed at [facility] [address], [place], [place] [address]    Special Requests   Final    NONE Reflexed from [id number] Performed at [facility], [place] [address], [place], [place] [address]    Gram Stain   Final    FEW WBC PRESENT, PREDOMINANTLY MONONUCLEAR FEW SQUAMOUS EPITHELIAL CELLS PRESENT FEW GRAM VARIABLE ROD FEW BUDDING YEAST SEEN    Culture   Final    FEW Normal respiratory flora-no Staph aureus or Pseudomonas seen Performed at [facility], [address] [place], [place] [address]    Report Status 12/08/2021 FINAL  Final  Culture, Respiratory w Gram Stain     Status: None   Collection Time: 12/06/21  6:15 PM   Specimen: Bronchoalveolar Lavage; Respiratory  Result Value Ref Range Status   Specimen Description   Final    BRONCHIAL ALVEOLAR LAVAGE Performed at  [facility], [place] [address], [place], [place] [address]    Special Requests   Final    NONE Performed at [facility], [place] [address], [place], [place] [address]    Gram Stain   Final    FEW WBC PRESENT, PREDOMINANTLY MONONUCLEAR NO ORGANISMS SEEN    Culture   Final    NO GROWTH 2 DAYS Performed at [facility] [address], [place], [place] [address]    Report Status 12/09/2021 FINAL  Final  Fungus Culture With Stain     Status: None (Preliminary result)   Collection Time: 12/06/21  6:15 PM   Specimen: Bronchial Alveolar Lavage  Result Value Ref Range Status   Fungus Stain Final report  Final    Comment: (NOTE) Performed At: [facility] [place], [place] [id number] Rush Farmer MD [id number]    Fungus (Mycology) Culture PENDING  Incomplete   Fungal Source BRONCHIAL ALVEOLAR LAVAGE  Final    Comment: Performed at [facility], [place] [address] [place], [place] [address]  Acid Fast Smear (AFB)     Status: None   Collection Time: 12/06/21  6:15 PM   Specimen: Bronchoalveolar Lavage; Respiratory  Result Value Ref Range Status   AFB Specimen Processing Concentration  Final   Acid Fast Smear Negative  Final    Comment: (NOTE) Performed At: [facility] [place], [place] [id number] Rush Farmer MD [id number]    Source (AFB) BRONCHIAL ALVEOLAR LAVAGE  Final    Comment: Performed at [facility] [address], [place], [place] [address]  Fungus Culture Result     Status: None   Collection Time: 12/06/21  6:15 PM  Result Value Ref Range Status   Result 1 Comment  Final    Comment: (NOTE) KOH/Calcofluor preparation:  no fungus observed. Performed At: [facility] [place], [place] [id number] Rush Farmer MD [id number]   Aspergillus Ag, BAL/Serum     Status: None   Collection Time: 12/07/21  8:36 AM   Specimen: Bronchial Alveolar Lavage; Respiratory  Result Value Ref Range Status   Aspergillus Ag, BAL/Serum 0.07 0.00 - 0.49 Index Final     Comment: (NOTE) Performed At: [facility] [place], [place] [id number] Rush Farmer MD [id number]   Culture, Respiratory w Gram Stain     Status: None   Collection Time: 12/08/21 11:32 AM   Specimen: Bronchoalveolar Lavage; Respiratory  Result Value Ref Range Status   Specimen Description   Final    BRONCHIAL ALVEOLAR LAVAGE Performed at [facility] [address], [place], [place] [address]    Special Requests   Final    NONE Performed at [facility], [place] [address] [place], [place] [address]    Gram Stain NO WBC SEEN NO ORGANISMS SEEN   Final   Culture   Final    NO GROWTH 2 DAYS Performed at [facility], [place] [address], [place], [place] [address]    Report Status 12/10/2021 FINAL  Final  Pneumocystis smear by DFA     Status: None   Collection Time: 12/08/21 11:32 AM   Specimen: Bronchoalveolar Lavage; Respiratory  Result Value Ref Range Status   Specimen Source-PJSRC BRONCHIAL ALVEOLAR LAVAGE  Final   Pneumocystis jiroveci Ag NEGATIVE  Final    Comment: Performed at [facility] Performed at [facility],  [place] [address] [place], [place] [address]      Pertinent Lab.    Latest Ref Rng & Units 12/15/2021    3:11 AM 12/14/2021    9:29 AM 12/13/2021    2:53 AM  CBC  WBC 4.0 - 10.5 K/uL 2.8  3.1  2.0   Hemoglobin 12.0 - 15.0 g/dL 7.8  8.8  8.9   Hematocrit 36.0 - 46.0 % 22.9  26.2  27.0   Platelets 150 - 400 K/uL _0 Latest Ref Rng & Units 12/15/2021    3:11 AM 12/14/2021    4:08 AM 12/13/2021    2:53 AM  CMP  Glucose 70 - 99 mg/dL 129  72  135   BUN 8 - 23 mg/dL _1 Creatinine 0.44 - 1.00 mg/dL 0.33  0.38  <0.30   Sodium 135 - 145 mmol/L 137  135  138   Potassium 3.5 - 5.1 mmol/L 3.5  3.8  3.6   Chloride 98 - 111 mmol/L 104  102  103   CO2 22 - 32 mmol/L _2 Calcium 8.9 - 10.3 mg/dL 8.4  8.6  8.6      Pertinent  Imaging today Plain films and CT images have been personally visualized and interpreted; radiology reports have been reviewed. Decision making incorporated into the Impression / Recommendations.  [name] EKG SITE RITE  Result Date: 12/11/2021 If Site Rite image not attached, placement could not be confirmed due to current cardiac rhythm.  DG CHEST PORT 1 VIEW  Result Date: 12/08/2021 CLINICAL DATA:  [id number] with acute respiratory failure with hypoxia and ventilator dependence. EXAM: PORTABLE CHEST 1 VIEW COMPARISON:  Portable 12/06/2021 FINDINGS: 4:41 a.m. ETT interval pullback to 2.6 cm from the carina. The left IJ central line again terminates in the distal SVC. NGT is partially visible coiled in the stomach. Neither the side-hole or the tip of the tube are filmed. The cardiac size is normal. Patchy dense opacities of the left mid and both lower lung fields appear similar, denser on the left as before and with small underlying left pleural effusion. The right mid and both apical lung regions remain generally clear. Overall aeration seems unchanged. The mediastinal configuration is normal. There are left axillary surgical clips, multiple overlying monitor wires. No acute osseous findings. IMPRESSION: 1. ETT interval pullback to 2.6 cm from the carina. 2. NGT coiled in the stomach. 3. Left IJ central line again terminates in the distal SVC. 4. No interval change in the appearance of the lungs. Electronically Signed   By: Telford Nab M.D.   On: 12/08/2021 07:32   ECHOCARDIOGRAM COMPLETE  Result Date: 12/07/2021    ECHOCARDIOGRAM REPORT   Patient Name:   HALEEMA VANDERHEYDEN Date of Exam: 12/07/2021 Medical Rec #:  [id number]   Height:       60.0 in Accession #:    [id number]  Weight:       119.5 lb Date of Birth:  [birth year]    BSA:          1.500 m Patient Age:    29 years    BP:           108/43 mmHg Patient Gender: F           HR:           86 bpm. Exam Location:  Inpatient Procedure: 2D Echo Indications:  Fever  History:        Patient has prior history of Echocardiogram examinations, most                 recent 06/01/2017. Risk Factors:Hypertension and Diabetes.  Sonographer:    Harvie Junior Referring Phys: [phone] [place]  1. Left ventricular ejection fraction, by estimation, is 45 to 50%. The left ventricle has mildly decreased function. The left ventricle has no regional wall motion abnormalities. Left ventricular diastolic parameters are indeterminate.  2. Right ventricular systolic function is normal. The right ventricular size is normal. There is normal pulmonary artery systolic pressure.  3. The mitral valve is grossly normal. No evidence of mitral valve regurgitation. No evidence of mitral stenosis.  4. The aortic valve is grossly normal. Aortic valve regurgitation is not visualized. No aortic stenosis is present.  5. The inferior vena cava is normal in size with <50% respiratory variability, suggesting right atrial pressure of 8 mmHg. Comparison(s): Changes from prior study are noted. EF mildly reduced on this study compared to prior, no focal wall motion abnormalities (globally mildly reduced). FINDINGS  Left Ventricle: Left ventricular ejection fraction, by estimation, is 45 to 50%. The left ventricle has mildly decreased function. The left ventricle has no regional wall motion abnormalities. The left ventricular internal cavity size was normal in size. There is no left ventricular hypertrophy. Left ventricular diastolic parameters are indeterminate. Right Ventricle: The right ventricular size is normal. No increase in right ventricular wall thickness. Right ventricular systolic function is normal. There is normal pulmonary artery systolic pressure. The tricuspid regurgitant velocity is 2.63 m/s, and  with an assumed right atrial pressure of 8 mmHg, the estimated right ventricular systolic pressure is 82.4 mmHg. Left Atrium: Left atrial size was normal in size. Right Atrium: Right atrial  size was normal in size. Pericardium: There is no evidence of pericardial effusion. Mitral Valve: The mitral valve is grossly normal. No evidence of mitral valve regurgitation. No evidence of mitral valve stenosis. Tricuspid Valve: The tricuspid valve is normal in structure. Tricuspid valve regurgitation is mild . No evidence of tricuspid stenosis. Aortic Valve: The aortic valve is grossly normal. Aortic valve regurgitation is not visualized. No aortic stenosis is present. Aortic valve mean gradient measures 5.0 mmHg. Aortic valve peak gradient measures 8.5 mmHg. Aortic valve area, by VTI measures 1.40 cm. Pulmonic Valve: The pulmonic valve was not well visualized. Pulmonic valve regurgitation is not visualized. No evidence of pulmonic stenosis. Aorta: The aortic root and ascending aorta are structurally normal, with no evidence of dilitation. Venous: The inferior vena cava is normal in size with less than 50% respiratory variability, suggesting right atrial pressure of 8 mmHg. IAS/Shunts: The atrial septum is grossly normal.  LEFT VENTRICLE PLAX 2D LVIDd:         4.10 cm     Diastology LVIDs:         3.20 cm     LV e' medial:    7.62 cm/s LV PW:         0.80 cm     LV E/e' medial:  9.8 LV IVS:        0.75 cm     LV e' lateral:   6.20 cm/s LVOT diam:     1.70 cm     LV E/e' lateral: 12.0 LV SV:         37 LV SV Index:   25 LVOT Area:     2.27 cm  LV Volumes (MOD) LV vol d, MOD A2C: 77.7 ml LV vol d, MOD A4C: 54.1 ml LV vol s, MOD A2C: 44.1 ml LV vol s, MOD A4C: 32.4 ml LV SV MOD A2C:     33.6 ml LV SV MOD A4C:     54.1 ml LV SV MOD BP:      27.4 ml RIGHT VENTRICLE RV Basal diam:  2.80 cm RV Mid diam:    2.20 cm TAPSE (M-mode): 1.6 cm LEFT ATRIUM             Index        RIGHT ATRIUM          Index LA diam:        2.90 cm 1.93 cm/m   RA Area:     7.72 cm LA Vol (A2C):   27.2 ml 18.14 ml/m  RA Volume:   14.30 ml 9.54 ml/m LA Vol (A4C):   22.9 ml 15.27 ml/m LA Biplane Vol: 25.4 ml 16.94 ml/m  AORTIC VALVE                      PULMONIC VALVE AV Area (Vmax):    1.43 cm      PV Vmax:       0.91 m/s AV Area (Vmean):   1.43 cm      PV Peak grad:  3.3 mmHg AV Area (VTI):     1.40 cm AV Vmax:           146.00 cm/s AV Vmean:          108.000 cm/s AV VTI:            0.264 m AV Peak Grad:      8.5 mmHg AV Mean Grad:      5.0 mmHg LVOT Vmax:         92.00 cm/s LVOT Vmean:        68.100 cm/s LVOT VTI:          0.163 m LVOT/AV VTI ratio: 0.62  AORTA Ao Root diam: 2.90 cm MITRAL VALVE               TRICUSPID VALVE MV Area (PHT): 3.53 cm    TR Peak grad:   27.7 mmHg MV Decel Time: 215 msec    TR Vmax:        263.00 cm/s MV E velocity: 74.60 cm/s MV A velocity: 81.80 cm/s  SHUNTS MV E/A ratio:  0.91        Systemic VTI:  0.16 m                            Systemic Diam: 1.70 cm Buford Dresser MD Electronically signed by Buford Dresser MD Signature Date/Time: 12/07/2021/4:20:28 PM    Final    DG CHEST PORT 1 VIEW  Result Date: 12/06/2021 CLINICAL DATA:  Status post central line placement EXAM: PORTABLE CHEST 1 VIEW COMPARISON:  Chest x-ray dated December 06, 2021 obtained at 6:18 p.m. FINDINGS: Slight interval retraction of ET tube which is approximately 1.0 cm from the carina. Interval placement of left IJ line with tip overlying the expected area of the mid SVC. Enteric tube partially seen coursing below the diaphragm. Right-greater-than-left heterogeneous opacities, unchanged. No large pleural effusion or evidence of pneumothorax. IMPRESSION: 1. Interval placement of left IJ line with tip overlying the expected area of the mid SVC. 2. Slight interval retraction of ET tube  which is approximately 1.0 cm from the carina. Consider further retraction for optimal positioning. Electronically Signed   By: Yetta Glassman M.D.   On: 12/06/2021 19:44   DG Abd 1 View  Result Date: 12/06/2021 CLINICAL DATA:  Orogastric tube placement EXAM: ABDOMEN - 1 VIEW COMPARISON:  CT 12/05/2021 FINDINGS: Limited radiograph of the  lower chest and upper abdomen was obtained for the purposes of enteric tube localization. Enteric tube is seen coursing below the diaphragm with distal tip and side port terminating within the expected location of the distal stomach. Endotracheal tube position in the right mainstem bronchus, as seen on dedicated chest x-ray. IMPRESSION: Enteric tube tip and side port project within the expected location of the distal stomach. Electronically Signed   By: Davina Poke D.O.   On: 12/06/2021 19:00   DG CHEST PORT 1 VIEW  Result Date: 12/06/2021 CLINICAL DATA:  Intubation EXAM: PORTABLE CHEST 1 VIEW COMPARISON:  12/06/2021 at 0845 hours FINDINGS: Interval placement of endotracheal tube extending into the proximal right mainstem bronchus. Interval placement of enteric tube which is coiled within the stomach. Heart size within normal limits. Similar bilateral airspace opacities with progressive opacification of the left lung, likely superimposed atelectasis. No pneumothorax. IMPRESSION: 1. Interval placement of endotracheal tube extending into the proximal right mainstem bronchus. Recommend retraction approximately 4 cm. 2. Increasing left lung opacification, likely atelectasis. 3. Interval placement of enteric tube which is coiled within the stomach. These results will be called to the ordering clinician or representative by the Radiologist Assistant, and communication documented in the PACS or [employer]. Electronically Signed   By: Davina Poke D.O.   On: 12/06/2021 18:57   DG CHEST PORT 1 VIEW  Result Date: 12/06/2021 CLINICAL DATA:  Pneumonia EXAM: PORTABLE CHEST 1 VIEW COMPARISON:  Previous studies including the chest radiograph done on 12/02/2021 and CT done on 12/05/2021 FINDINGS: Transverse diameter of heart is increased. Large alveolar infiltrates are seen in left upper, mid and lower lung fields with interval worsening. There are new patchy infiltrates in right lung. Right lateral CP  angle is clear. There is no pneumothorax. Surgical clips are seen in left axilla and left chest wall. IMPRESSION: Cardiomegaly. Extensive infiltrates are seen in left lung with interval progression. There are multiple patchy infiltrates in the right lung. Findings suggest interval worsening of bilateral multifocal pneumonia. Electronically Signed   By: Elmer Picker M.D.   On: 12/06/2021 09:46   CT CHEST ABDOMEN PELVIS W CONTRAST  Result Date: 12/05/2021 CLINICAL DATA:  Sepsis.  Persistent fever.  Pancytopenia. EXAM: CT CHEST, ABDOMEN, AND PELVIS WITH CONTRAST TECHNIQUE: Multidetector CT imaging of the chest, abdomen and pelvis was performed following the standard protocol during bolus administration of intravenous contrast. RADIATION DOSE REDUCTION: This exam was performed according to the departmental dose-optimization program which includes automated exposure control, adjustment of the mA and/or kV according to patient size and/or use of iterative reconstruction technique. CONTRAST:  114m OMNIPAQUE IOHEXOL 300 MG/ML  SOLN COMPARISON:  10/21/2021 FINDINGS: CT CHEST FINDINGS Cardiovascular: Heart size appears normal. No pericardial effusion. Aortic atherosclerosis. Mediastinum/Nodes: Thyroid gland, trachea, and esophagus are unremarkable. Previous left axillary node dissection. No axillary, mediastinal, or hilar adenopathy identified. Lungs/Pleura: Severe, multifocal bilateral airspace opacities identified compatible with pneumonia. This is most severe within the left lower lobe where there is near complete consolidation. There is dense airspace consolidation noted within the posterolateral right lower lobe with overlying areas of ground-glass attenuation. Airspace disease within the inferior lingula is  also identified. Patchy area of ground-glass attenuation in the left apex is unchanged from the previous exam. There is a small left pleural effusion which overlies the posterior right upper lobe and  superior segment of left lower lobe. There is also a small amount of fluid identified overlying the left lung base. Musculoskeletal: Left mastectomy. No acute or suspicious osseous findings. CT ABDOMEN PELVIS FINDINGS Hepatobiliary: No focal liver abnormality is seen. No gallstones, gallbladder wall thickening, or biliary dilatation. Pancreas: Unremarkable. No pancreatic ductal dilatation or surrounding inflammatory changes. Spleen: Normal in size without focal abnormality. Adrenals/Urinary Tract: Normal adrenal glands. No nephrolithiasis, hydronephrosis or mass. Urinary bladder appears normal. Stomach/Bowel: Stomach is within normal limits. Appendix appears normal. No evidence of bowel wall thickening, distention, or inflammatory changes. Vascular/Lymphatic: Aortic atherosclerosis. No signs of abdominopelvic adenopathy. Reproductive: Uterus and bilateral adnexa are unremarkable. Other: No free fluid or fluid collections. Musculoskeletal: No acute or significant osseous findings. Scattered subcutaneous injection sites identified within the subcutaneous soft tissues of the ventral abdominal wall. IMPRESSION: 1. Severe, multifocal bilateral airspace opacities compatible with pneumonia. This is most severe within the left lower lobe where there is near complete airspace consolidation. 2. Small left pleural effusion. 3. No acute findings identified within the abdomen or pelvis. 4.  Aortic Atherosclerosis (ICD10-I70.0). Electronically Signed   By: Kerby Moors M.D.   On: 12/05/2021 14:14   DG CHEST PORT 1 VIEW  Result Date: 12/02/2021 CLINICAL DATA:  Hypoxia. EXAM: PORTABLE CHEST 1 VIEW COMPARISON:  December 02, 2021 (8:25 a.m.) FINDINGS: The heart size and mediastinal contours are within normal limits. Persistent moderate severity left upper lobe and left lower lobe atelectasis and/or infiltrate is seen. There is a small, stable left pleural effusion. No pneumothorax is identified. Multiple surgical clips are seen  along the left axilla. The visualized skeletal structures are unremarkable. IMPRESSION: Persistent moderate severity left upper lobe and left lower lobe atelectasis and/or infiltrate. Electronically Signed   By: Virgina Norfolk M.D.   On: 12/02/2021 20:15   DG Chest Port 1 View  Result Date: 12/02/2021 CLINICAL DATA:  Shortness of breath. EXAM: PORTABLE CHEST 1 VIEW COMPARISON:  Chest x-ray 11 8 2023 FINDINGS: The cardiac silhouette, mediastinal contours are stable. Persistent and slightly progressive left upper and lower lobe pneumonia. Suspect small parapneumonic effusion. The right lung remains clear. IMPRESSION: Persistent and slightly progressive left upper and lower lobe pneumonia. Electronically Signed   By: Marijo Sanes M.D.   On: 12/02/2021 08:44   DG Chest Port 1 View  Result Date: 12/01/2021 CLINICAL DATA:  Possible sepsis low-grade fever EXAM: PORTABLE CHEST 1 VIEW COMPARISON:  CT 10/21/2021 FINDINGS: Airspace disease at the left lung base. No pleural effusion. Mild cardiomegaly. No pneumothorax. Clips in the left axilla IMPRESSION: Airspace disease at the left lung base concerning for pneumonia. Cardiomegaly. Electronically Signed   By: Donavan Foil M.D.   On: 12/01/2021 23:01     I spent 65 minutes for this patient encounter including review of prior medical records, coordination of care with primary/other specialist with greater than 50% of time being face to face/counseling and discussing diagnostics/treatment plan with the patient/family.  Electronically signed by:   Rosiland Oz, MD Infectious Disease Physician [facility] for Infectious Disease Pager: [phone]

## 2021-12-15 NOTE — Progress Notes (Signed)
OT Cancellation Note  Patient Details Name: Amanda Duarte MRN: [id number] DOB: Jul 26, 1955   Cancelled Treatment:    Reason Eval/Treat Not Completed: Medical issues which prohibited therapy Patient with platelet count of 9 and increased blood pressure this AM. OT to continue to follow and check back as schedule will allow. Rennie Plowman, MS Acute Rehabilitation Department Office# [phone]  12/15/2021, 7:23 AM

## 2021-12-15 NOTE — Progress Notes (Signed)
PROGRESS NOTE Amanda Duarte  YTK:[id number] DOB: 25-Jul-1955 DOA: 12/01/2021 PCP: [name], NP   Brief Narrative/Hospital Course: 48 yof w/ left breast cancer (Dx 04/2017 S/P left mastectomy and adjuvant radiation), HTN HLD T2DM and recent diagnosis of high-grade myeloid Neoplasm/myelodysplastic syndrome (Bone marrow biopsy 10/2 with confirmatory biopsy 10/11 revealing 7% blasts) thought to be caused by history of chemotherapy complicated by pancytopenia presented to [facility]/ fatigue initially found to have sepsis secondary to left lower lobe pneumonia and admitted on 12/02/21.  Despite aggressive treatment with broad-spectrum intravenous antibiotics in the days that followed patient clinically worsened with recurrent fevers and worsening shortness of breath with progressively worsening acute hypoxic respiratory failure eventually prompting PCCM involvement and broadening of antibiotic coverage under the direction of infectious disease.   Hospital course was complicated by persisting pancytopenia requiring intermittent transfusions of blood products including platelets (1 pack on 11/4, 1 pack 11/16 and 1 pack 11/17),  and packed red blood cells (11/9).  Dr. Annamaria Boots with oncology has been following.  Current targets are transfusions for platelet count of less than 15 and hemoglobin of less than 7.   Unfortunately due to progressive respiratory failure patient eventually required intubation on 11/13 for progressive respiratory failure and transfer to the intensive care unit.  A bronchoscopy was performed at that time with BAL performed for cultures.  Repeat bronchoscopy was performed on 11/15 revealing evidence pulmonary hemorrhage, thought to be secondary to recurrent severe thrombocytopenia.  Hospital course was further complicated by the development of septic shock requiring intermittent vasopressors.  Patient was managed with Neo-Synephrine which has since been weaned off.  Patient also developed episodes of  paroxysmal atrial fibrillation.  Amiodarone was initially given but later this was discontinued on 11/14 after having episodes of polymorphic ventricular tachycardia and prolonged QTc.   Patient has since been weaned off of vasopressors.  Patient has been extubated on 11/16.  Patient is currently weaned to 5 L of oxygen via nasal cannula.  11/18: Transferred back to [facility] service.  Worked with PT 11/17 advised home health PT with RW 11/18> left IJ removed and RUE PICC line placed 11/20: ID discontinued Vancomycin ( MRSA PCR is negative, BAL cx and resp cx negative, blood cx negative) Remains on meropenem/micafungin, bactrim ppx while on high dose steroids for PJP ppx.   Subjective: Seen and examined this morning.  She is resting comfortably complains of some low back pain Her daughter on the phone Past 24 hours-afebrile BP poorly controlled 150s to 200 Labs reviewed-platelet/hemoglobin dropped-platelet 9, hemoglobin 7.8- 1 unit prbc ordered Remains on micafungin/meropenem  Assessment and Plan: ARDS> resolved Acute hypoxic and hypercapnic respiratory failure> resolved Septic shock-Resolved Multilobar pneumonia versus DAH immunocompromised setting: Initially in the ICU needing intubation, vasopressor support> at this time clinically improved hemodynamically stable, on room air. Blood culture 11/12> NGTD, BAL culture 11/15 NGTD.  ID discontinued Vancomycin and remains on meropenem/micafungin w/ PICC line, Bactrim ppx while on high dose steroids for PJP prophylaxis.    PAF/polymorphic VT with prolonged Qtc:off amio. On monitor. Hypertension: BP stable, cont home losartan and prn meds HLD cont lipitor   Pancytopenia MDS Previous breast cancer history: [name] following.  S/p 2 Units PRBC 11/9 and 11/3. S/p multiple platelet transfusions (on  11/17, 11/16, 12/15, 11/13 x 2 and 11/19, 1 unit platelets ordered 11/22. Cont prednisone  po taper as ordered  by oncology. Continue antibiotic and  prophylaxis as above. Goal of Hb > 7.5  and PLT >  15K per hem-onc.  Patient does not realize the gravity of her illness on discussion with patient's daughter.  Would appreciate if oncology can have further  discussion with patient about her prognosis.  Daughter would like to know more about it as well/. She continues need transfusion support with platelet and PRBC very frequently  Recent Labs  Lab 12/13/21 0253 12/14/21 0929 12/15/21 0311  HGB 8.9* 8.8* 7.8*  HCT 27.0* 26.2* 22.9*  WBC 2.0* 3.1* 2.8*  PLT 24* 13* 9*   T2DM with uncontrolled hyperglycemia: at home on Amaryl 4 mg, Lantus 25 bedtime Janumet, Humalog 4 units Premeal .  Fluctuating overall stable continue current 20u Levemir bid, SSI 0-20 units,4 u premeal novolog. Of note on steroid taper so continue to address insulin Recent Labs  Lab 12/14/21 1155 12/14/21 1556 12/14/21 2020 12/15/21 0717 12/15/21 1156  GLUCAP 152* 185* 336* 79 195*     Hypophosphatemia Hypokalemia: Monitor and replete as needed  Moderate malnutrition augment diet with supplement-ensure, magic cup, boost , mvi, dietitian following  Goals of care currently full code.  Seen by palliative care. Overall prognosis is poor-but patient does not seem to realize.  I frankly discussed with patient's daughter, will appreciate if Hem-onc can throw some more light.  DVT prophylaxis: SCDs Start: 12/02/21 1022 Code Status:   Code Status: Full Code Family Communication: plan of care discussed with patient and her daughter on the phone   Patient status is: Patient because of pancytopenia, respiratory failure Level of care: Progressive  Dispo: The patient is from: home            Anticipated disposition: home w/ daughter w/ PTOT once okay with hematology. Mobility Assessment (last 72 hours)     Mobility Assessment     Row Name 12/13/21 1602 12/13/21 1350 12/12/21 1455       Does patient have an order for bedrest or is patient medically unstable -- -- No -  Continue assessment     What is the highest level of mobility based on the progressive mobility assessment? Level 5 (Walks with assist in room/hall) - Balance while stepping forward/back and can walk in room with assist - Complete Level 5 (Walks with assist in room/hall) - Balance while stepping forward/back and can walk in room with assist - Complete Level 3 (Stands with assist) - Balance while standing  and cannot march in place     Is the above level different from baseline mobility prior to current illness? -- -- Yes - Recommend PT order               Objective: Vitals last 24 hrs: Vitals:   12/15/21 0634 12/15/21 1100 12/15/21 1126 12/15/21 1141  BP: (!) 195/66  (!) 138/57 (!) 143/54  Pulse: 83  79 77  Resp:  (!) 21 (!) 21 12  Temp:   97.7 F (36.5 C) 98.6 F (37 C)  TempSrc:   Oral   SpO2: 98%     Weight:      Height:       Weight change:   Physical Examination: General exam: AA, weak,older appearing HEENT:Oral mucosa moist, Ear/Nose WNL grossly, dentition normal. Respiratory system: bilaterally C;EAR BS,no use of accessory muscle Cardiovascular system: S1 & S2 +, regular rate. Gastrointestinal system: Abdomen soft, NT,ND,BS+ Nervous System:Alert, awake, moving extremities and grossly nonfocal Extremities: LE ankle edema neg, lower extremities warm Skin: No rashes,no icterus. MSK: Normal muscle bulk,tone, power  Rue picc+  Medications reviewed:  Scheduled Meds:  sodium chloride   Intravenous Once   sodium chloride   Intravenous Once   sodium chloride   Intravenous Once   amLODipine  2.5 mg Oral Daily   atorvastatin  40 mg Oral Daily   Chlorhexidine Gluconate Cloth  6 each Topical Daily   docusate sodium  100 mg Oral BID   feeding supplement  1 Container Oral Q24H   feeding supplement  237 mL Oral Q24H   feeding supplement (GLUCERNA SHAKE)  237 mL Oral TID BM   insulin aspart  0-20 Units Subcutaneous TID WC   insulin aspart  0-5 Units Subcutaneous QHS    insulin aspart  4 Units Subcutaneous TID WC   insulin detemir  20 Units Subcutaneous Q12H   lidocaine  1 patch Transdermal Q24H   losartan  50 mg Oral Daily   multivitamin with minerals  1 tablet Oral Daily   pantoprazole  40 mg Oral BID   polyethylene glycol  17 g Oral Daily   predniSONE  60 mg Oral Q breakfast   Followed by   Derrill Memo ON 12/19/2021] predniSONE  50 mg Oral Q breakfast   Followed by   Derrill Memo ON 12/26/2021] predniSONE  40 mg Oral Q breakfast   Followed by   Derrill Memo ON 01/02/2022] predniSONE  30 mg Oral Q breakfast   Followed by   Derrill Memo ON 01/09/2022] predniSONE  20 mg Oral Q breakfast   Followed by   Derrill Memo ON 01/16/2022] predniSONE  10 mg Oral Q breakfast   sodium chloride flush  10-40 mL Intracatheter Q12H   sulfamethoxazole-trimethoprim  1 tablet Oral Once per day on Mon Wed Fri   Thrombi-Pad  1 each Topical Once   Continuous Infusions:  sodium chloride Stopped (12/07/21 1409)      Diet Order             Diet Carb Modified Fluid consistency: Thin; Room service appropriate? Yes  Diet effective now                    Intake/Output Summary (Last 24 hours) at 12/15/2021 1207 Last data filed at 12/15/2021 1100 Gross per 24 hour  Intake 790.09 ml  Output 1300 ml  Net -509.91 ml   Net IO Since Admission: 11,038.18 mL [12/15/21 1207]  Wt Readings from Last 3 Encounters:  12/15/21 60.1 kg  11/23/21 51.5 kg  11/17/21 51.7 kg     Unresulted Labs (From admission, onward)     Start     Ordered   12/15/21 0500  CBC with Differential/Platelet  Daily at 5am,   R     Question:  Specimen collection method  Answer:  IV Team=IV Team collect   12/14/21 0645   12/14/21 2951  Basic metabolic panel  Daily at 5am,   R     Question:  Specimen collection method  Answer:  IV Team=IV Team collect   12/13/21 1017   12/13/21 0735  CBC with Differential/Platelet  Add-on,   AD       Question:  Specimen collection method  Answer:  IV Team=IV Team collect   12/13/21 0734    12/06/21 1822  Acid Fast Culture with reflexed sensitivities  (AFB smear + Culture w reflexed sensitivities panel)  Once,   R       See Hyperspace for full Linked Orders Report.   12/06/21 1821   12/05/21 0000  Type and screen  R       Comments: [facility]  12/05/21 2206          Data Reviewed: I have personally reviewed following labs and imaging studies CBC: Recent Labs  Lab 12/09/21 1200 12/10/21 0440 12/11/21 0451 12/12/21 0226 12/13/21 0253 12/14/21 0929 12/15/21 0311  WBC 1.4*   < > 1.6* 1.5* 2.0* 3.1* 2.8*  NEUTROABS 0.4*  --   --   --  0.3* 0.6* 0.5*  HGB 7.7*   < > 9.1* 8.8* 8.9* 8.8* 7.8*  HCT 23.8*   < > 27.9* 27.1* 27.0* 26.2* 22.9*  MCV 94.4   < > 93.0 91.9 91.5 90.3 89.1  PLT 25*   < > 22* 13* 24* 13* 9*   < > = values in this interval not displayed.   Basic Metabolic Panel: Recent Labs  Lab 12/09/21 0413 12/09/21 1200 12/10/21 0440 12/11/21 0451 12/12/21 0226 12/13/21 0253 12/14/21 0408 12/15/21 0311  NA 145   < > 151* 145 140 138 135 137  K 5.7*   < > 3.4* 3.6 3.4* 3.6 3.8 3.5  CL 115*   < > 116* 110 103 103 102 104  CO2 24   < > _0 GLUCOSE 185*   < > 175* 354* 172* 135* 72 129*  BUN 60*   < > 42* 30* _1 CREATININE 0.46   < > 0.32* 0.40* 0.37* <0.30* 0.38* 0.33*  CALCIUM 8.5*   < > 8.7* 8.6* 8.5* 8.6* 8.6* 8.4*  MG 2.7*  --  2.1  --   --   --   --   --   PHOS 3.9  --  3.5  --   --   --   --   --    < > = values in this interval not displayed.   Recent Results (from the past 240 hour(s))  Expectorated Sputum Assessment w Gram Stain, Rflx to Resp Cult     Status: None   Collection Time: 12/05/21  6:35 PM   Specimen: Sputum  Result Value Ref Range Status   Specimen Description   Final    SPU Performed at [place] [address], [place], [place] [address]    Special Requests   Final    NONE Performed at [facility], [place] [address],  [place], [place] [address]    Sputum evaluation THIS SPECIMEN IS ACCEPTABLE FOR SPUTUM CULTURE  Final   Report Status 12/05/2021 FINAL  Final  Culture, Respiratory w Gram Stain     Status: None   Collection Time: 12/05/21  6:35 PM   Specimen: Sputum  Result Value Ref Range Status   Specimen Description   Final    SPU Performed at [place] [address], [place], [place] [address]    Special Requests   Final    NONE Reflexed from [id number] Performed at [facility], [place] [address], [place], [place] [address]    Gram Stain   Final    FEW WBC PRESENT, PREDOMINANTLY MONONUCLEAR FEW SQUAMOUS EPITHELIAL CELLS PRESENT FEW GRAM VARIABLE ROD FEW BUDDING YEAST SEEN    Culture   Final    FEW Normal respiratory flora-no Staph aureus or Pseudomonas seen Performed at [facility], [address], [place], [place] [address]    Report Status 12/08/2021 FINAL  Final  Culture, Respiratory w Gram Stain     Status: None   Collection Time: 12/06/21  6:15 PM  Specimen: Bronchoalveolar Lavage; Respiratory  Result Value Ref Range Status   Specimen Description   Final    BRONCHIAL ALVEOLAR LAVAGE Performed at [place] [address], [place], [place] [address]    Special Requests   Final    NONE Performed at [facility], [place] [address] [place], [place] [address]    Gram Stain   Final    FEW WBC PRESENT, PREDOMINANTLY MONONUCLEAR NO ORGANISMS SEEN    Culture   Final    NO GROWTH 2 DAYS Performed at [place] [address] [place], [place] [address]    Report Status 12/09/2021 FINAL  Final  Fungus Culture With Stain     Status: None (Preliminary result)   Collection Time: 12/06/21  6:15 PM   Specimen: Bronchial Alveolar Lavage  Result Value Ref Range Status   Fungus Stain Final report  Final    Comment: (NOTE) Performed At: [facility] [place], [place]  [id number] Rush Farmer MD YW:[id number]    Fungus (Mycology) Culture PENDING  Incomplete   Fungal Source BRONCHIAL ALVEOLAR LAVAGE  Final    Comment: Performed at [facility], [place] [address] [place], [place] [address]  Acid Fast Smear (AFB)     Status: None   Collection Time: 12/06/21  6:15 PM   Specimen: Bronchoalveolar Lavage; Respiratory  Result Value Ref Range Status   AFB Specimen Processing Concentration  Final   Acid Fast Smear Negative  Final    Comment: (NOTE) Performed At: [facility] [place], [place] [id number] Rush Farmer MD HW:[id number]    Source (AFB) BRONCHIAL ALVEOLAR LAVAGE  Final    Comment: Performed at [place] [address], [place], [place] [address]  Fungus Culture Result     Status: None   Collection Time: 12/06/21  6:15 PM  Result Value Ref Range Status   Result 1 Comment  Final    Comment: (NOTE) KOH/Calcofluor preparation:  no fungus observed. Performed At: [facility] [place], [place] [id number] Rush Farmer MD NI:[id number]   Aspergillus Ag, BAL/Serum     Status: None   Collection Time: 12/07/21  8:36 AM   Specimen: Bronchial Alveolar Lavage; Respiratory  Result Value Ref Range Status   Aspergillus Ag, BAL/Serum 0.07 0.00 - 0.49 Index Final    Comment: (NOTE) Performed At: [facility] [place], [place] [id number] Rush Farmer MD QQ:[id number]   Culture, Respiratory w Gram Stain     Status: None   Collection Time: 12/08/21 11:32 AM   Specimen: Bronchoalveolar Lavage; Respiratory  Result Value Ref Range Status   Specimen Description   Final    BRONCHIAL ALVEOLAR LAVAGE Performed at [place] [address], [place], [place] [address]    Special Requests   Final    NONE Performed at [facility], [place] [address] [place], [place] [address]    Gram Stain NO WBC SEEN NO  ORGANISMS SEEN   Final   Culture   Final    NO GROWTH 2 DAYS Performed at [facility], [place] [address], [place], [place] [address]    Report Status 12/10/2021 FINAL  Final  Pneumocystis smear by DFA     Status: None   Collection Time: 12/08/21 11:32 AM   Specimen: Bronchoalveolar Lavage; Respiratory  Result Value Ref Range Status   Specimen [place] BRONCHIAL ALVEOLAR LAVAGE  Final  Pneumocystis jiroveci Ag NEGATIVE  Final    Comment: Performed at [place] [facility] Performed at [place] [address] [place], [place] [address]     Antimicrobials: Anti-infectives (From admission, onward)    Start     Dose/Rate Route Frequency Ordered Stop   12/13/21 1000  sulfamethoxazole-trimethoprim (BACTRIM DS) 800-160 MG per tablet 1 tablet        1 tablet Oral Once per day on Mon Wed Fri 12/11/21 1227     12/12/21 1500  vancomycin (VANCOREADY) IVPB 1250 mg/250 mL  Status:  Discontinued        1,250 mg 166.7 mL/hr over 90 Minutes Intravenous Every 12 hours 12/12/21 1308 12/13/21 1306   12/09/21 1400  vancomycin (VANCOCIN) IVPB 1000 mg/200 mL premix  Status:  Discontinued        1,000 mg 200 mL/hr over 60 Minutes Intravenous Every 12 hours 12/09/21 1351 12/12/21 1308   12/08/21 1000  voriconazole (VFEND) 200 mg in sodium chloride 0.9 % 100 mL IVPB  Status:  Discontinued       See Hyperspace for full Linked Orders Report.   200 mg 60 mL/hr over 120 Minutes Intravenous Every 12 hours 12/07/21 0841 12/08/21 0836   12/08/21 1000  micafungin (MYCAMINE) 100 mg in sodium chloride 0.9 % 100 mL IVPB  Status:  Discontinued        100 mg 105 mL/hr over 1 Hours Intravenous Every 24 hours 12/08/21 0836 12/15/21 1002   12/07/21 2330  vancomycin (VANCOREADY) IVPB 750 mg/150 mL  Status:  Discontinued        750 mg 150 mL/hr over 60 Minutes Intravenous Every 24 hours 12/07/21 0000 12/09/21 1351   12/07/21 1000  voriconazole (VFEND) 300 mg in sodium chloride 0.9 %  100 mL IVPB       See Hyperspace for full Linked Orders Report.   300 mg 65 mL/hr over 120 Minutes Intravenous Every 12 hours 12/07/21 0841 12/08/21 0007   12/07/21 0630  meropenem (MERREM) 1 g in sodium chloride 0.9 % 100 mL IVPB  Status:  Discontinued        1 g 200 mL/hr over 30 Minutes Intravenous Every 8 hours 12/06/21 2202 12/15/21 1002   12/06/21 2000  micafungin (MYCAMINE) 100 mg in sodium chloride 0.9 % 100 mL IVPB  Status:  Discontinued        100 mg 105 mL/hr over 1 Hours Intravenous Every 24 hours 12/06/21 1838 12/07/21 0841   12/05/21 1630  meropenem (MERREM) 1 g in sodium chloride 0.9 % 100 mL IVPB  Status:  Discontinued        1 g 200 mL/hr over 30 Minutes Intravenous Every 12 hours 12/05/21 1521 12/05/21 1549   12/05/21 1630  vancomycin (VANCOREADY) IVPB 750 mg/150 mL  Status:  Discontinued        750 mg 150 mL/hr over 60 Minutes Intravenous Every 24 hours 12/05/21 1521 12/07/21 0000   12/05/21 1630  meropenem (MERREM) 1 g in sodium chloride 0.9 % 100 mL IVPB  Status:  Discontinued        1 g 200 mL/hr over 30 Minutes Intravenous Every 8 hours 12/05/21 1549 12/06/21 2202   12/03/21 0800  vancomycin (VANCOREADY) IVPB 750 mg/150 mL  Status:  Discontinued        750 mg 150 mL/hr over 60 Minutes Intravenous Every 24 hours 12/02/21 0923 12/04/21 1041   12/02/21 1045  metroNIDAZOLE (FLAGYL) IVPB 500 mg  Status:  Discontinued        500 mg 100 mL/hr over 60 Minutes Intravenous Every 12 hours 12/02/21 1021 12/05/21 1515   12/02/21 1000  ceFEPIme (MAXIPIME) 2 g in sodium chloride 0.9 % 100 mL IVPB  Status:  Discontinued        2 g 200 mL/hr over 30 Minutes Intravenous Every 12 hours 12/02/21 0923 12/05/21 1515   12/02/21 0800  vancomycin (VANCOCIN) IVPB 1000 mg/200 mL premix        1,000 mg 200 mL/hr over 60 Minutes Intravenous  Once 12/02/21 0754 12/02/21 0913   12/01/21 2330  ceFEPIme (MAXIPIME) 2 g in sodium chloride 0.9 % 100 mL IVPB        2 g 200 mL/hr over 30 Minutes  Intravenous  Once 12/01/21 2315 12/01/21 2352   12/01/21 2330  azithromycin (ZITHROMAX) 500 mg in sodium chloride 0.9 % 250 mL IVPB        500 mg 250 mL/hr over 60 Minutes Intravenous  Once 12/01/21 2315 12/02/21 0026      Culture/Microbiology    Component Value Date/Time   SDES  12/08/2021 1132    BRONCHIAL ALVEOLAR LAVAGE Performed at [facility], [place] [address], [place], [place] [address]    SPECREQUEST  12/08/2021 1132    NONE Performed at [facility], [place] [address] [place], [place] [address]    CULT  12/08/2021 1132    NO GROWTH 2 DAYS Performed at [place] [address] [place], [place] [address]    REPTSTATUS 12/10/2021 FINAL 12/08/2021 1132  Radiology Studies: No results found.   LOS: 13 days   Antonieta Pert, MD [facility]  12/15/2021, 12:07 PM

## 2021-12-16 DIAGNOSIS — D61818 Other pancytopenia: Secondary | ICD-10-CM | POA: Diagnosis not present

## 2021-12-16 LAB — BASIC METABOLIC PANEL
Anion gap: 4 — ABNORMAL LOW (ref 5–15)
BUN: 19 mg/dL (ref 8–23)
CO2: 28 mmol/L (ref 22–32)
Calcium: 8.7 mg/dL — ABNORMAL LOW (ref 8.9–10.3)
Chloride: 105 mmol/L (ref 98–111)
Creatinine, Ser: 0.35 mg/dL — ABNORMAL LOW (ref 0.44–1.00)
GFR, Estimated: >60 mL/min (ref >60)
Glucose, Bld: 67 mg/dL — ABNORMAL LOW (ref 70–99)
Potassium: 3.5 mmol/L (ref 3.5–5.1)
Sodium: 137 mmol/L (ref 135–145)

## 2021-12-16 LAB — GLUCOSE, CAPILLARY
Glucose-Capillary: 62 mg/dL — ABNORMAL LOW (ref 70–99)
Glucose-Capillary: 166 mg/dL — ABNORMAL HIGH (ref 70–99)
Glucose-Capillary: 361 mg/dL — ABNORMAL HIGH (ref 70–99)
Glucose-Capillary: 457 mg/dL — ABNORMAL HIGH (ref 70–99)
Glucose-Capillary: 300 mg/dL — ABNORMAL HIGH (ref 70–99)

## 2021-12-16 LAB — CBC WITH DIFFERENTIAL/PLATELET
Abs Immature Granulocytes: 0.02 10*3/uL (ref 0.00–0.07)
Basophils Absolute: 0 10*3/uL (ref 0.0–0.1)
Basophils Relative: 1 %
Eosinophils Absolute: 0 10*3/uL (ref 0.0–0.5)
Eosinophils Relative: 0 %
HCT: 23.7 % — ABNORMAL LOW (ref 36.0–46.0)
Hemoglobin: 8 g/dL — ABNORMAL LOW (ref 12.0–15.0)
Immature Granulocytes: 1 %
Lymphocytes Relative: 73 %
Lymphs Abs: 2.9 10*3/uL (ref 0.7–4.0)
MCH: 30.1 pg (ref 26.0–34.0)
MCHC: 33.8 g/dL (ref 30.0–36.0)
MCV: 89.1 fL (ref 80.0–100.0)
Monocytes Absolute: 0.1 10*3/uL (ref 0.1–1.0)
Monocytes Relative: 2 %
Neutro Abs: 0.9 10*3/uL — ABNORMAL LOW (ref 1.7–7.7)
Neutrophils Relative %: 23 %
Platelets: 39 10*3/uL — ABNORMAL LOW (ref 150–400)
RBC: 2.66 MIL/uL — ABNORMAL LOW (ref 3.87–5.11)
RDW: 14.5 % (ref 11.5–15.5)
WBC: 3.9 10*3/uL — ABNORMAL LOW (ref 4.0–10.5)
nRBC: 0.5 % — ABNORMAL HIGH (ref 0.0–0.2)

## 2021-12-16 LAB — PREPARE PLATELET PHERESIS: Unit division: 0

## 2021-12-16 LAB — BPAM PLATELET PHERESIS
Blood Product Expiration Date: 202311232359
ISSUE DATE / TIME: 202311221120
Unit Type and Rh: 5100

## 2021-12-16 MED ORDER — AMLODIPINE BESYLATE 5 MG PO TABS
5.0000 mg | ORAL_TABLET | Freq: Every day | ORAL | Status: DC
  Administered 2021-12-16 – 2021-12-22 (×7): 5 mg via ORAL
  Filled 2021-12-16 (×7): qty 1

## 2021-12-16 MED ORDER — INSULIN DETEMIR 100 UNIT/ML ~~LOC~~ SOLN
4.0000 [IU] | Freq: Once | SUBCUTANEOUS | Status: AC
  Administered 2021-12-16: 4 [IU] via SUBCUTANEOUS
  Filled 2021-12-16: qty 0.04

## 2021-12-16 MED ORDER — INSULIN DETEMIR 100 UNIT/ML ~~LOC~~ SOLN
12.0000 [IU] | Freq: Two times a day (BID) | SUBCUTANEOUS | Status: DC
  Administered 2021-12-16 – 2021-12-17 (×2): 12 [IU] via SUBCUTANEOUS
  Filled 2021-12-16 (×3): qty 0.12

## 2021-12-16 MED ORDER — INSULIN ASPART 100 UNIT/ML IJ SOLN
3.0000 [IU] | Freq: Three times a day (TID) | INTRAMUSCULAR | Status: DC
  Administered 2021-12-16 – 2021-12-17 (×3): 3 [IU] via SUBCUTANEOUS

## 2021-12-16 MED ORDER — INSULIN ASPART 100 UNIT/ML IJ SOLN
0.0000 [IU] | Freq: Three times a day (TID) | INTRAMUSCULAR | Status: DC
  Administered 2021-12-16: 15 [IU] via SUBCUTANEOUS
  Administered 2021-12-17: 2 [IU] via SUBCUTANEOUS

## 2021-12-16 NOTE — Plan of Care (Signed)
  Problem: Education: Goal: Knowledge of General Education information will improve Description Including pain rating scale, medication(s)/side effects and non-pharmacologic comfort measures Outcome: Progressing   Problem: Health Behavior/Discharge Planning: Goal: Ability to manage health-related needs will improve Outcome: Progressing   

## 2021-12-16 NOTE — Progress Notes (Signed)
PROGRESS NOTE Amanda Duarte  VOZ:[id number] DOB: 09-16-1955 DOA: 12/01/2021 PCP: [name], NP   Brief Narrative/Hospital Course: 31 yof w/ left breast cancer (Dx 04/2017 S/P left mastectomy and adjuvant radiation), HTN HLD T2DM and recent diagnosis of high-grade myeloid Neoplasm/myelodysplastic syndrome (Bone marrow biopsy 10/2 with confirmatory biopsy 10/11 revealing 7% blasts) thought to be caused by history of chemotherapy complicated by pancytopenia presented to [facility]/ fatigue initially found to have sepsis secondary to left lower lobe pneumonia and admitted on 12/02/21.  Despite aggressive treatment with broad-spectrum intravenous antibiotics in the days that followed patient clinically worsened with recurrent fevers and worsening shortness of breath with progressively worsening acute hypoxic respiratory failure eventually prompting PCCM involvement and broadening of antibiotic coverage under the direction of infectious disease.   Hospital course was complicated by persisting pancytopenia requiring intermittent transfusions of blood products including platelets (1 pack on 11/4, 1 pack 11/16 and 1 pack 11/17),  and packed red blood cells (11/9).  Dr. Annamaria Boots with oncology has been following.  Current targets are transfusions for platelet count of less than 15 and hemoglobin of less than 7.   Unfortunately due to progressive respiratory failure patient eventually required intubation on 11/13 for progressive respiratory failure and transfer to the intensive care unit.  A bronchoscopy was performed at that time with BAL performed for cultures.  Repeat bronchoscopy was performed on 11/15 revealing evidence pulmonary hemorrhage, thought to be secondary to recurrent severe thrombocytopenia.  Hospital course was further complicated by the development of septic shock requiring intermittent vasopressors.  Patient was managed with Neo-Synephrine which has since been weaned off.  Patient also developed episodes of  paroxysmal atrial fibrillation.  Amiodarone was initially given but later this was discontinued on 11/14 after having episodes of polymorphic ventricular tachycardia and prolonged QTc.   Patient has since been weaned off of vasopressors.  Patient has been extubated on 11/16.  Patient is currently weaned to 5 L of oxygen via nasal cannula.  11/18: Transferred back to [facility] service.  Worked with PT 11/17 advised home health PT with RW 11/18> left IJ removed and RUE PICC line placed 11/20: ID discontinued Vancomycin ( MRSA PCR is negative, BAL cx and resp cx negative, blood cx negative) 12/15/21:Meropenem/micafungin stopped.   Subjective: Seen and examined. Resting comfortably eating her meal Overnight afebrile> off antibiotics 11/22 Blood sugar low upto  62 this morning> recheck 166 BP remains uncontrolled  Assessment and Plan: ARDS> resolved Acute hypoxic and hypercapnic respiratory failure> resolved Septic shock-Resolved Multilobar pneumonia versus DAH immunocompromised setting: Initially in the ICU needing intubation, vasopressor support> at this time clinically improved hemodynamically stable, on room air. Blood culture 11/12> NGTD, BAL culture 11/15 NGTD.  Off meropenem/micafungin 11/22> monitoring off antibiotics for 24 hours.  Continue Bactrim prophylaxis while on steroids.    PAF/polymorphic VT with prolonged Qtc:off amio. On monitor.  Hypertension: BP is poorly controlled, increased losartan to 50 mg , started on amlodipine> increased to 5 mg.  Continue PRN meds  HLD cont lipitor   Pancytopenia MDS Previous breast cancer history: [name] following.  S/p 2 Units PRBC 11/9 and 11/3. S/p multiple platelet transfusions (on  11/17, 11/16, 12/15, 11/13 x 2 and 11/19, 11/22).  SB platelet count improving as below. Cont prednisone  po taper as ordered  by oncology. Goal of Hb > 7.5  and PLT > 15K per hem-onc.  Discussed with [name] she has had an outpatient coming Monday to consider  additional transfusion and  blood work and if counts stable 11/24 patient can be discharged.  Patient does not realize the gravity of her illness on discussion with patient's daughter who is well aware of poor prognosis. Recent Labs  Lab 12/14/21 0929 12/15/21 0311 12/16/21 0323  HGB 8.8* 7.8* 8.0*  HCT 26.2* 22.9* 23.7*  WBC 3.1* 2.8* 3.9*  PLT 13* 9* 39*   T2DM with uncontrolled hyperglycemia and hypoglycemia:At home on Amaryl 4 mg, Lantus 25 bedtime Janumet, Humalog 4 units Premeal.Blood sugar has been labile fluctuating, monitor closely while on steroid.  Adjust insulin dose further today> monitor and adjust insulin,continue SSI.  Recent Labs  Lab 12/15/21 1658 12/15/21 1935 12/15/21 2138 12/16/21 0747 12/16/21 1119  GLUCAP 435* 337* 273* 62* 166*     Hypophosphatemia Hypokalemia: Resolved  Moderate malnutrition augment diet with supplement-ensure, magic cup, boost , mvi, dietitian following  Goals of care currently full code.  Seen by palliative care. Overall prognosis is poor-but patient does not seem to realize.  I frankly discussed with patient's daughter, will appreciate if Hem-onc can throw some more light and will be done as outpatient per Dr Burr Medico.  DVT prophylaxis: SCDs Start: 12/02/21 1022 Code Status:   Code Status: Full Code Family Communication: plan of care discussed with patient and her daughter on the phone   Patient status is: Patient because of pancytopenia, respiratory failure Level of care: Progressive  Dispo: The patient is from: home            Anticipated disposition: home w/ daughter w/ PTOT, on 1124 is cbc stable  Objective: Vitals last 24 hrs: Vitals:   12/15/21 1940 12/16/21 0500 12/16/21 0530 12/16/21 0919  BP: (!) 161/56  (!) 176/59 (!) 174/61  Pulse: 72  69   Resp: 20  20   Temp: 97.7 F (36.5 C)     TempSrc: Oral     SpO2: 100%  100%   Weight:  57.1 kg    Height:       Weight change: -3 kg  Physical Examination: General exam:  AAOX3,weak,older appearing HEENT:Oral mucosa moist, Ear/Nose WNL grossly, dentition normal. Respiratory system: bilaterally CLEAR BS,no use of accessory muscle Cardiovascular system: S1 & S2 +, regular rate, JVD neg. Gastrointestinal system: Abdomen soft,NT,ND,BS+ Nervous System:Alert, awake, moving extremities and grossly nonfocal Extremities: LE ankle edema neg, lower extremities warm Skin: No rashes,no icterus. MSK: Normal muscle bulk,tone, power  Rue picc+  Medications reviewed:  Scheduled Meds:  sodium chloride   Intravenous Once   amLODipine  5 mg Oral Daily   atorvastatin  40 mg Oral Daily   Chlorhexidine Gluconate Cloth  6 each Topical Daily   docusate sodium  100 mg Oral BID   feeding supplement (GLUCERNA SHAKE)  237 mL Oral TID BM   insulin aspart  0-20 Units Subcutaneous TID WC   insulin aspart  0-5 Units Subcutaneous QHS   insulin aspart  3 Units Subcutaneous TID WC   insulin detemir  12 Units Subcutaneous BID   lidocaine  1 patch Transdermal Q24H   losartan  50 mg Oral Daily   multivitamin with minerals  1 tablet Oral Daily   pantoprazole  40 mg Oral BID   polyethylene glycol  17 g Oral Daily   predniSONE  60 mg Oral Q breakfast   Followed by   Derrill Memo ON 12/19/2021] predniSONE  50 mg Oral Q breakfast   Followed by   Derrill Memo ON 12/26/2021] predniSONE  40 mg Oral Q breakfast   Followed by   [  START ON 01/02/2022] predniSONE  30 mg Oral Q breakfast   Followed by   Derrill Memo ON 01/09/2022] predniSONE  20 mg Oral Q breakfast   Followed by   Derrill Memo ON 01/16/2022] predniSONE  10 mg Oral Q breakfast   sodium chloride flush  10-40 mL Intracatheter Q12H   sulfamethoxazole-trimethoprim  1 tablet Oral Once per day on Mon Wed Fri   Thrombi-Pad  1 each Topical Once   Continuous Infusions:  sodium chloride Stopped (12/07/21 1409)      Diet Order             Diet Carb Modified Fluid consistency: Thin; Room service appropriate? Yes  Diet effective now                     Intake/Output Summary (Last 24 hours) at 12/16/2021 1214 Last data filed at 12/16/2021 0500 Gross per 24 hour  Intake 334 ml  Output 800 ml  Net -466 ml   Net IO Since Admission: 10,572.18 mL [12/16/21 1214]  Wt Readings from Last 3 Encounters:  12/16/21 57.1 kg  11/23/21 51.5 kg  11/17/21 51.7 kg     Unresulted Labs (From admission, onward)     Start     Ordered   12/13/21 0735  CBC with Differential/Platelet  Add-on,   AD       Question:  Specimen collection method  Answer:  IV Team=IV Team collect   12/13/21 0734   12/06/21 1822  Acid Fast Culture with reflexed sensitivities  (AFB smear + Culture w reflexed sensitivities panel)  Once,   R       See Hyperspace for full Linked Orders Report.   12/06/21 1821   12/05/21 0000  Type and screen  R       Comments: [place]    12/05/21 2206          Data Reviewed: I have personally reviewed following labs and imaging studies CBC: Recent Labs  Lab 12/12/21 0226 12/13/21 0253 12/14/21 0929 12/15/21 0311 12/16/21 0323  WBC 1.5* 2.0* 3.1* 2.8* 3.9*  NEUTROABS  --  0.3* 0.6* 0.5* 0.9*  HGB 8.8* 8.9* 8.8* 7.8* 8.0*  HCT 27.1* 27.0* 26.2* 22.9* 23.7*  MCV 91.9 91.5 90.3 89.1 89.1  PLT 13* 24* 13* 9* 39*   Basic Metabolic Panel: Recent Labs  Lab 12/10/21 0440 12/11/21 0451 12/12/21 0226 12/13/21 0253 12/14/21 0408 12/15/21 0311 12/16/21 0323  NA 151*   < > 140 138 135 137 137  K 3.4*   < > 3.4* 3.6 3.8 3.5 3.5  CL 116*   < > 103 103 102 104 105  CO2 27   < > _0 GLUCOSE 175*   < > 172* 135* 72 129* 67*  BUN 42*   < > _1 CREATININE 0.32*   < > 0.37* <0.30* 0.38* 0.33* 0.35*  CALCIUM 8.7*   < > 8.5* 8.6* 8.6* 8.4* 8.7*  MG 2.1  --   --   --   --   --   --   PHOS 3.5  --   --   --   --   --   --    < > = values in this interval not displayed.   Recent Results (from the past 240 hour(s))  Culture, Respiratory w Gram Stain     Status: None   Collection Time:  12/06/21  6:15 PM  Specimen: Bronchoalveolar Lavage; Respiratory  Result Value Ref Range Status   Specimen Description   Final    BRONCHIAL ALVEOLAR LAVAGE Performed at [facility] [address] [place], [place] [address]    Special Requests   Final    NONE Performed at [facility], [place] [address] [place], [place] [address]    Gram Stain   Final    FEW WBC PRESENT, PREDOMINANTLY MONONUCLEAR NO ORGANISMS SEEN    Culture   Final    NO GROWTH 2 DAYS Performed at [place] [address], [place], [place] [address]    Report Status 12/09/2021 FINAL  Final  Fungus Culture With Stain     Status: None (Preliminary result)   Collection Time: 12/06/21  6:15 PM   Specimen: Bronchial Alveolar Lavage  Result Value Ref Range Status   Fungus Stain Final report  Final    Comment: (NOTE) Performed At: [facility] [place], [place] [id number] Rush Farmer MD UP:[id number]    Fungus (Mycology) Culture PENDING  Incomplete   Fungal Source BRONCHIAL ALVEOLAR LAVAGE  Final    Comment: Performed at [facility], [place] [address], [place], [place] [address]  Acid Fast Smear (AFB)     Status: None   Collection Time: 12/06/21  6:15 PM   Specimen: Bronchoalveolar Lavage; Respiratory  Result Value Ref Range Status   AFB Specimen Processing Concentration  Final   Acid Fast Smear Negative  Final    Comment: (NOTE) Performed At: [facility] [place], [place] [id number] Rush Farmer MD RN:[id number]    Source (AFB) BRONCHIAL ALVEOLAR LAVAGE  Final    Comment: Performed at [place] [address] [place], [place] [address]  Fungus Culture Result     Status: None   Collection Time: 12/06/21  6:15 PM  Result Value Ref Range Status   Result 1 Comment  Final    Comment: (NOTE) KOH/Calcofluor preparation:  no fungus observed. Performed At: [facility] [place], [place] [id number] Rush Farmer MD KH:[id number]   Aspergillus Ag, BAL/Serum     Status: None   Collection Time: 12/07/21  8:36 AM   Specimen: Bronchial Alveolar Lavage; Respiratory  Result Value Ref Range Status   Aspergillus Ag, BAL/Serum 0.07 0.00 - 0.49 Index Final    Comment: (NOTE) Performed At: [facility] [place], [place] [id number] Rush Farmer MD HW:[id number]   Culture, Respiratory w Gram Stain     Status: None   Collection Time: 12/08/21 11:32 AM   Specimen: Bronchoalveolar Lavage; Respiratory  Result Value Ref Range Status   Specimen Description   Final    BRONCHIAL ALVEOLAR LAVAGE Performed at [place] [address], [place], [place] [address]    Special Requests   Final    NONE Performed at [facility], [place] [address] [place], [place] [address]    Gram Stain NO WBC SEEN NO ORGANISMS SEEN   Final   Culture   Final    NO GROWTH 2 DAYS Performed at [facility], [place] [address] [place], [place] [address]    Report Status 12/10/2021 FINAL  Final  Pneumocystis smear by DFA     Status: None   Collection Time: 12/08/21 11:32 AM   Specimen: Bronchoalveolar Lavage; Respiratory  Result Value Ref Range Status   Specimen [place] BRONCHIAL ALVEOLAR LAVAGE  Final  Pneumocystis jiroveci Ag NEGATIVE  Final    Comment: Performed at [place] [facility] Performed at [place] [address] [place], [place] [address]     Antimicrobials: Anti-infectives (From admission, onward)    Start     Dose/Rate Route Frequency Ordered Stop   12/13/21 1000  sulfamethoxazole-trimethoprim (BACTRIM DS) 800-160 MG per tablet 1 tablet        1 tablet Oral Once per day on Mon Wed Fri 12/11/21 1227     12/12/21 1500  vancomycin (VANCOREADY) IVPB 1250 mg/250 mL  Status:  Discontinued        1,250 mg 166.7 mL/hr over 90 Minutes Intravenous  Every 12 hours 12/12/21 1308 12/13/21 1306   12/09/21 1400  vancomycin (VANCOCIN) IVPB 1000 mg/200 mL premix  Status:  Discontinued        1,000 mg 200 mL/hr over 60 Minutes Intravenous Every 12 hours 12/09/21 1351 12/12/21 1308   12/08/21 1000  voriconazole (VFEND) 200 mg in sodium chloride 0.9 % 100 mL IVPB  Status:  Discontinued       See Hyperspace for full Linked Orders Report.   200 mg 60 mL/hr over 120 Minutes Intravenous Every 12 hours 12/07/21 0841 12/08/21 0836   12/08/21 1000  micafungin (MYCAMINE) 100 mg in sodium chloride 0.9 % 100 mL IVPB  Status:  Discontinued        100 mg 105 mL/hr over 1 Hours Intravenous Every 24 hours 12/08/21 0836 12/15/21 1002   12/07/21 2330  vancomycin (VANCOREADY) IVPB 750 mg/150 mL  Status:  Discontinued        750 mg 150 mL/hr over 60 Minutes Intravenous Every 24 hours 12/07/21 0000 12/09/21 1351   12/07/21 1000  voriconazole (VFEND) 300 mg in sodium chloride 0.9 % 100 mL IVPB       See Hyperspace for full Linked Orders Report.   300 mg 65 mL/hr over 120 Minutes Intravenous Every 12 hours 12/07/21 0841 12/08/21 0007   12/07/21 0630  meropenem (MERREM) 1 g in sodium chloride 0.9 % 100 mL IVPB  Status:  Discontinued        1 g 200 mL/hr over 30 Minutes Intravenous Every 8 hours 12/06/21 2202 12/15/21 1002   12/06/21 2000  micafungin (MYCAMINE) 100 mg in sodium chloride 0.9 % 100 mL IVPB  Status:  Discontinued        100 mg 105 mL/hr over 1 Hours Intravenous Every 24 hours 12/06/21 1838 12/07/21 0841   12/05/21 1630  meropenem (MERREM) 1 g in sodium chloride 0.9 % 100 mL IVPB  Status:  Discontinued        1 g 200 mL/hr over 30 Minutes Intravenous Every 12 hours 12/05/21 1521 12/05/21 1549   12/05/21 1630  vancomycin (VANCOREADY) IVPB 750 mg/150 mL  Status:  Discontinued        750 mg 150 mL/hr over 60 Minutes Intravenous Every 24 hours 12/05/21 1521 12/07/21 0000   12/05/21 1630  meropenem (MERREM) 1 g in sodium chloride 0.9 % 100 mL IVPB   Status:  Discontinued        1 g 200 mL/hr over 30 Minutes Intravenous Every 8 hours 12/05/21 1549 12/06/21 2202   12/03/21 0800  vancomycin (VANCOREADY) IVPB 750 mg/150 mL  Status:  Discontinued        750 mg 150 mL/hr over 60 Minutes Intravenous Every 24 hours 12/02/21 0923 12/04/21 1041   12/02/21 1045  metroNIDAZOLE (FLAGYL) IVPB 500 mg  Status:  Discontinued        500 mg 100 mL/hr over 60 Minutes Intravenous Every 12 hours 12/02/21 1021 12/05/21 1515   12/02/21 1000  ceFEPIme (MAXIPIME) 2 g in sodium chloride 0.9 % 100 mL IVPB  Status:  Discontinued        2 g 200 mL/hr over 30 Minutes Intravenous Every 12 hours 12/02/21 0923 12/05/21 1515   12/02/21 0800  vancomycin (VANCOCIN) IVPB 1000 mg/200 mL premix        1,000 mg 200 mL/hr over 60 Minutes Intravenous  Once 12/02/21 0754 12/02/21 0913   12/01/21 2330  ceFEPIme (MAXIPIME) 2 g in sodium chloride 0.9 % 100 mL IVPB        2 g 200 mL/hr over 30 Minutes Intravenous  Once 12/01/21 2315 12/01/21 2352   12/01/21 2330  azithromycin (ZITHROMAX) 500 mg in sodium chloride 0.9 % 250 mL IVPB        500 mg 250 mL/hr over 60 Minutes Intravenous  Once 12/01/21 2315 12/02/21 0026      Culture/Microbiology    Component Value Date/Time   SDES  12/08/2021 1132    BRONCHIAL ALVEOLAR LAVAGE Performed at [facility], [place] [address], [place], [place] [address]    SPECREQUEST  12/08/2021 1132    NONE Performed at [facility], [place] [address] [place], [place] [address]    CULT  12/08/2021 1132    NO GROWTH 2 DAYS Performed at [place] [address] [place], [place] [address]    REPTSTATUS 12/10/2021 FINAL 12/08/2021 1132  Radiology Studies: No results found.   LOS: 14 days   Antonieta Pert, MD [facility]  12/16/2021, 12:14 PM

## 2021-12-16 NOTE — Progress Notes (Signed)
POC CBG of [phone] reported to [place] night hospitalist coverage for additional orders

## 2021-12-17 DIAGNOSIS — I10 Essential (primary) hypertension: Secondary | ICD-10-CM

## 2021-12-17 DIAGNOSIS — J80 Acute respiratory distress syndrome: Secondary | ICD-10-CM | POA: Diagnosis not present

## 2021-12-17 DIAGNOSIS — J181 Lobar pneumonia, unspecified organism: Secondary | ICD-10-CM | POA: Diagnosis not present

## 2021-12-17 DIAGNOSIS — D709 Neutropenia, unspecified: Secondary | ICD-10-CM | POA: Diagnosis not present

## 2021-12-17 DIAGNOSIS — E876 Hypokalemia: Secondary | ICD-10-CM

## 2021-12-17 LAB — GLUCOSE, CAPILLARY
Glucose-Capillary: 70 mg/dL (ref 70–99)
Glucose-Capillary: 79 mg/dL (ref 70–99)
Glucose-Capillary: 86 mg/dL (ref 70–99)
Glucose-Capillary: 73 mg/dL (ref 70–99)
Glucose-Capillary: 150 mg/dL — ABNORMAL HIGH (ref 70–99)
Glucose-Capillary: 332 mg/dL — ABNORMAL HIGH (ref 70–99)
Glucose-Capillary: 367 mg/dL — ABNORMAL HIGH (ref 70–99)

## 2021-12-17 LAB — BASIC METABOLIC PANEL
Anion gap: 6 (ref 5–15)
BUN: 16 mg/dL (ref 8–23)
CO2: 28 mmol/L (ref 22–32)
Calcium: 8.6 mg/dL — ABNORMAL LOW (ref 8.9–10.3)
Chloride: 105 mmol/L (ref 98–111)
Creatinine, Ser: 0.31 mg/dL — ABNORMAL LOW (ref 0.44–1.00)
GFR, Estimated: >60 mL/min (ref >60)
Glucose, Bld: 58 mg/dL — ABNORMAL LOW (ref 70–99)
Potassium: 3.4 mmol/L — ABNORMAL LOW (ref 3.5–5.1)
Sodium: 139 mmol/L (ref 135–145)

## 2021-12-17 MED ORDER — POTASSIUM CHLORIDE CRYS ER 20 MEQ PO TBCR
40.0000 meq | EXTENDED_RELEASE_TABLET | ORAL | Status: AC
  Administered 2021-12-17 (×2): 40 meq via ORAL
  Filled 2021-12-17 (×2): qty 2

## 2021-12-17 MED ORDER — INSULIN DETEMIR 100 UNIT/ML ~~LOC~~ SOLN
10.0000 [IU] | Freq: Every day | SUBCUTANEOUS | Status: DC
  Administered 2021-12-18 – 2021-12-19 (×2): 10 [IU] via SUBCUTANEOUS
  Filled 2021-12-17 (×2): qty 0.1

## 2021-12-17 MED ORDER — INSULIN ASPART 100 UNIT/ML IJ SOLN
0.0000 [IU] | Freq: Three times a day (TID) | INTRAMUSCULAR | Status: DC
  Administered 2021-12-17: 7 [IU] via SUBCUTANEOUS
  Administered 2021-12-18: 2 [IU] via SUBCUTANEOUS
  Administered 2021-12-18: 7 [IU] via SUBCUTANEOUS
  Administered 2021-12-19: 2 [IU] via SUBCUTANEOUS

## 2021-12-17 NOTE — Assessment & Plan Note (Signed)
Cell count stable with wbc 3,9, Hgb 8,0 and plt 39, No evidence of active bleeding.  Follow up cell count in am, patient has been afebrile and antibiotic therapy has been completed.

## 2021-12-17 NOTE — Progress Notes (Signed)
Physical Therapy Treatment Patient Details Name: Amanda Duarte MRN: [id number] DOB: [birth year] Today's Date: 12/17/2021   History of Present Illness Pt is a 66 year old woman admitted on 11/9 with fever, weakness, hyperglycemia and AMS. 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 Pt with increased hypoxia on 11/13, intubated 11/13- 11/16 with multilobar PNA. PMH: breast cancer s/p L mastectomy/chemo/radiation, HTN, HLD, anemia, DM2.    PT Comments    Nephew present during session. Student PTA translated during session. Pt progressing. Denied any pain, states "my legs are getting weaker because I'm in bed all day. I need to get them stronger." Required min assist for bed mobility to assist for trunk positioning, increased time needed; Min assist needed for transfers to rise and steady from elevated bed and BSC. Cues needed for correct hand placement; Pt required min assist to ambulate 60 ft for safety on RA. Nephew followed with recliner for safety. Monitored vitals throughout session with lowest RA at 93% and highest heart rate at 121 during ambulation. Patient plans on returning home with daughter.  Recommendations for follow up therapy are one component of a multi-disciplinary discharge planning process, led by the attending physician.  Recommendations may be updated based on patient status, additional functional criteria and insurance authorization.  Follow Up Recommendations  Home health PT     Assistance Recommended at Discharge Frequent or constant Supervision/Assistance  Patient can return home with the following A lot of help with walking and/or transfers;A little help with bathing/dressing/bathroom;Assistance with cooking/housework;Assist for transportation;Help with stairs or ramp for entrance   Equipment Recommendations  Other (comment)    Recommendations for Other Services       Precautions / Restrictions Precautions Precautions: Fall Restrictions Weight Bearing Restrictions: No      Mobility  Bed Mobility Overal bed mobility: Needs Assistance Bed Mobility: Supine to Sit     Supine to sit: HOB elevated, Min assist     General bed mobility comments: with increased time and physical assist for trunk positioning.    Transfers Overall transfer level: Needs assistance Equipment used: Rolling walker (2 wheels) Transfers: Sit to/from Stand, Bed to chair/wheelchair/BSC Sit to Stand: Min assist Stand pivot transfers: Min assist         General transfer comment: min assist to rise and steady from elevated bed and BSC. Cues needed for correct hand placement.    Ambulation/Gait Ambulation/Gait assistance: Min assist, +2 safety/equipment Gait Distance (Feet): 60 Feet Assistive device: Rolling walker (2 wheels) Gait Pattern/deviations: Step-to pattern, Decreased step length - left, Decreased step length - right, Shuffle, Trunk flexed Gait velocity: decr     General Gait Details: Pt required min assist to ambulate 60 ft for safety on RA. Nephew followed with recliner for safety.   Stairs             Wheelchair Mobility    Modified Rankin (Stroke Patients Only)       Balance                                            Cognition Arousal/Alertness: Awake/alert Behavior During Therapy: WFL for tasks assessed/performed Overall Cognitive Status: Difficult to assess                                 General Comments: WFL for tasks assessed,  PTA translated to pt, pt appears to have poor insight into the gravity of her illness and need for care when she returns home        Exercises      General Comments        Pertinent Vitals/Pain Pain Assessment Pain Assessment: No/denies pain Pain Intervention(s): Monitored during session    Home Living                          Prior Function            PT Goals (current goals can now be found in the care plan section) Acute Rehab PT Goals Patient  Stated Goal: regain IND PT Goal Formulation: With patient Time For Goal Achievement: 12/23/21 Potential to Achieve Goals: Good Progress towards PT goals: Progressing toward goals    Frequency    Min 3X/week      PT Plan      Co-evaluation              AM-PAC PT "6 Clicks" Mobility   Outcome Measure  Help needed turning from your back to your side while in a flat bed without using bedrails?: A Lot Help needed moving from lying on your back to sitting on the side of a flat bed without using bedrails?: A Lot Help needed moving to and from a bed to a chair (including a wheelchair)?: A Little Help needed standing up from a chair using your arms (e.g., wheelchair or bedside chair)?: A Little Help needed to walk in hospital room?: A Little Help needed climbing 3-5 steps with a railing? : A Lot 6 Click Score: 15    End of Session Equipment Utilized During Treatment: Gait belt Activity Tolerance: Patient tolerated treatment well;No increased pain Patient left: with call bell/phone within reach;in chair;with chair alarm set;with family/visitor present Nurse Communication: Mobility status PT Visit Diagnosis: Unsteadiness on feet (R26.81);Muscle weakness (generalized) (M62.81);Difficulty in walking, not elsewhere classified (R26.2)     Time: 1610-9604 PT Time Calculation (min) (ACUTE ONLY): 25 min  Charges:  $Gait Training: 8-22 mins $Therapeutic Activity: 8-22 mins                        Allyson Sabal 12/17/2021, 12:59 PM

## 2021-12-17 NOTE — Progress Notes (Signed)
Progress Note   Patient: Amanda Duarte WCH:[id number] DOB: 10-Jun-1955 DOA: 12/01/2021     15 DOS: the patient was seen and examined on 12/17/2021   Brief hospital course: Mrs. Ramos was admitted to the hospital with the working diagnosis of left lower lobe pneumonia, complicated with acute hypoxemic respiratory failure.   28 yof w/ left breast cancer (Dx 04/2017 S/P left mastectomy and adjuvant radiation), HTN HLD T2DM and recent diagnosis of high-grade myeloid Neoplasm/myelodysplastic syndrome (Bone marrow biopsy 10/2 with confirmatory biopsy 10/11 revealing 7% blasts) thought to be caused by history of chemotherapy complicated by pancytopenia   Despite aggressive treatment with broad-spectrum intravenous antibiotics in the days that followed patient clinically worsened with recurrent fevers and worsening shortness of breath with progressively worsening acute hypoxic respiratory failure eventually prompting PCCM involvement and broadening of antibiotic coverage under the direction of infectious disease.    Hospital course was complicated by persisting pancytopenia requiring intermittent transfusions of blood products including platelets (1 pack on 11/4, 1 pack 11/16 and 1 pack 11/17),  and packed red blood cells (11/9).   Dr. Annamaria Boots with oncology has been following.  Current targets are transfusions for platelet count of less than 15 and hemoglobin of less than 7.   Unfortunately due to progressive respiratory failure patient eventually required intubation on 11/13 for progressive respiratory failure and transfer to the intensive care unit.  A bronchoscopy was performed at that time with BAL performed for cultures.  Repeat bronchoscopy was performed on 11/15 revealing evidence pulmonary hemorrhage, thought to be secondary to recurrent severe thrombocytopenia.  Hospital course was further complicated by the development of septic shock requiring intermittent vasopressors.   Patient was managed with  Neo-Synephrine which has since been weaned off.  Patient also developed episodes of paroxysmal atrial fibrillation.  Amiodarone was initially given but later this was discontinued on 11/14 after having episodes of polymorphic ventricular tachycardia and prolonged QTc.    Patient has since been weaned off of vasopressors.  Patient has been extubated on 11/16.  Patient is currently weaned to 5 L of oxygen via nasal cannula.  11/18: Transferred back to [facility] service.  Worked with PT 11/17 advised home health PT with RW 11/18> left IJ removed and RUE PICC line placed 11/20: ID discontinued Vancomycin ( MRSA PCR is negative, BAL cx and resp cx negative, blood cx negative) 12/15/21:Meropenem/micafungin stopped, plan to monitor off antibiotics.    Assessment and Plan: Acute respiratory distress syndrome (ARDS) (HCC) Acute hypercapnic and hypoxemic respiratory failure.  Alveolar hemorrhage.   Patient is slowly recovering, today she has no dyspnea and was able to ambulate with physical therapy.  Her 02 saturation is 100 on 2 L/min per [place] She is out of bed today.   Plan to continue respiratory support with supplemental 02 per [place] Bronchodilator therapy and airway clearing techniques with flutter valve and incentive spirometer.  Continue with prednisone taper, PJP prophylaxis with bactrim.  Check ambulatory oxymetry on room air, she may need supplemental home 02 at the time of discharge.    Lobar pneumonia, unspecified organism ([place]) Resolved septic shock. (Not present on admission).  Patient has completed antibiotic therapy, discontinued on 12/13/21.  She has been afebrile, wbc is 3,9  Cultures have been no growth.  Ok to change level of care to medical surg, Ok to discontinue telemetry monitoring.   Neutropenic fever ([place]) Cell count stable with wbc 3,9, Hgb 8,0 and plt 39, No evidence of active bleeding.  Follow up cell count  in am, patient has been afebrile and antibiotic therapy has been  completed.   Essential hypertension Continue blood pressure control with amlodipine and losartan.  Systolic blood pressure has been 119 to 139 mmHg.   MDS (myelodysplastic syndrome), high grade [facility]) Patient follows up with Dr Annamaria Boots as outpatient.  Her overall prognosis is poor due to her MDS.  Holding chemotherapy until follow up as outpatient.  Plan for PRBC transfusion for hgb less than 7.5 or platelets less than 15,000.   Hypokalemia Renal function has been stable with serum cr at 0,31, K is 3,4 and serum bicarbonate at 28, Plan to add 40 kcl po bid today and follow up renal function in am.  Patient is tolerating po well.   Type 2 diabetes mellitus with hyperlipidemia (HCC) Uncontrolled with Hypoglycemia/ steroid induced hyperglycemia Fasting glucose this am 58, capillary glucose, 86, 73, 150.  Plan to decrease basal insulin to 10 units and continue with insulin sliding scale for glucose cover and monitoring.  Discontinue pre meal insulin for now.   Continue with statin therapy.   Malnutrition of moderate degree Continue with nutritional supplements.   GERD (gastroesophageal reflux disease) Continue with antiacid therapy.         Subjective: Patient is feeling better, continue to have deconditioning and not back to baseline, her dyspnea is improving, no chest pain and tolerating po well.   Physical Exam: Vitals:   12/16/21 2031 12/17/21 0508 12/17/21 0949 12/17/21 1236  BP: (!) 120/46 (!) 119/51 (!) 131/56 (!) 139/41  Pulse: 75 71  70  Resp: 16 16    Temp: 97.8 F (36.6 C) 98.8 F (37.1 C)  98.4 F (36.9 C)  TempSrc: Oral Oral  Oral  SpO2: 100% 100%  100%  Weight:      Height:       Neurology awake and alert ENT With mild pallor with no icterus Cardiovascular with S1 and S2 present and rhythmic with no gallops, rubs or murmurs Respiratory with wheezing, or rhonchi, positive rales at the left base Abdomen with no distention  No lower extremity edema  Data  Reviewed:    Family Communication: I spoke with patient's grandson at the bedside, we talked in detail about patient's condition, plan of care and prognosis and all questions were addressed.   Disposition: Status is: Inpatient Remains inpatient appropriate because: improving respiratory failure possible discharge in 48 hrs home   Planned Discharge Destination: Home      Author: Tawni Millers, MD 12/17/2021 2:24 PM  For on call review [email or web address].

## 2021-12-17 NOTE — Assessment & Plan Note (Signed)
Patient follows up with Dr Annamaria Boots as outpatient.  Her overall prognosis is poor due to her MDS.  Holding chemotherapy until follow up as outpatient.  Plan for PRBC transfusion for hgb less than 7.5 or platelets less than 15,000.

## 2021-12-17 NOTE — Plan of Care (Signed)
  Problem: Respiratory: Goal: Ability to maintain adequate ventilation will improve Outcome: Progressing   Problem: Education: Goal: Knowledge of General Education information will improve Description: Including pain rating scale, medication(s)/side effects and non-pharmacologic comfort measures Outcome: Progressing   Problem: Health Behavior/Discharge Planning: Goal: Ability to manage health-related needs will improve Outcome: Progressing   Problem: Safety: Goal: Ability to remain free from injury will improve Outcome: Progressing

## 2021-12-17 NOTE — Progress Notes (Signed)
Occupational Therapy Treatment Patient Details Name: Amanda Duarte MRN: [id number] DOB: [birth year] Today's Date: 12/17/2021   History of present illness Pt is a 66 year old woman admitted on 11/9 with fever, weakness, hyperglycemia and AMS. 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 Pt with increased hypoxia on 11/13, intubated 11/13- 11/16 with multilobar PNA. PMH: breast cancer s/p L mastectomy/chemo/radiation, HTN, HLD, anemia, DM2.   OT comments  The pt was motivated to participate in the session. Her daughter and 2 grandchildren were present and agreed to interpret during the session. The pt required min assist to stand, min guard assist for ambulating in her room and in the hall using a RW, and min assist for toileting at bathroom level. Her O2 saturation was noted to be: 99% on 2L at rest, 97% on 2L with activity, and 95% on room air with activity. She was educated on simple energy conservation strategies and pacing, to facilitate progressive activity tolerance. Continue OT plan of care.   Recommendations for follow up therapy are one component of a multi-disciplinary discharge planning process, led by the attending physician.  Recommendations may be updated based on patient status, additional functional criteria and insurance authorization.    Follow Up Recommendations  Home health OT     Assistance Recommended at Discharge Intermittent Supervision/Assistance  Patient can return home with the following  A little help with walking and/or transfers;Direct supervision/assist for medications management;Assistance with cooking/housework;A little help with bathing/dressing/bathroom   Equipment Recommendations  Tub/shower seat       Precautions / Restrictions Precautions Precautions: Fall Precaution Comments: The pt's primary language is Spanish, however she understands some basic English Restrictions Weight Bearing Restrictions: No       Mobility Bed Mobility                General bed mobility comments: Pt was received seated in the bedside chair    Transfers Overall transfer level: Needs assistance Equipment used: Rolling walker (2 wheels) Transfers: Sit to/from Stand Sit to Stand: Min assist           General transfer comment: min assist to rise and steady; Cues needed for correct hand placement.         ADL either performed or assessed with clinical judgement   ADL Overall ADL's : Needs assistance/impaired Eating/Feeding: Independent;Sitting   Grooming: Min guard;Standing Grooming Details (indicate cue type and reason): She performed hand washing at sink level.         Upper Body Dressing : Set up;Sitting       Toilet Transfer: Minimal assistance;Rolling walker (2 wheels) Toilet Transfer Details (indicate cue type and reason): Required verbal cues for RW placement and use of grab bar for support Toileting- Clothing Manipulation and Hygiene: Minimal assistance;Sit to/from stand Toileting - Clothing Manipulation Details (indicate cue type and reason): Toileting performed at bathroom level, with intermittent steadying assist needed in standing and min assist for clothing management. She performed seated posterior and anterior hygiene seated with SBA.             Cognition Arousal/Alertness: Awake/alert Behavior During Therapy: WFL for tasks assessed/performed Overall Cognitive Status: Within Functional Limits for tasks assessed                           Pertinent Vitals/ Pain       Pain Assessment Pain Assessment: No/denies pain         Frequency  Min 2X/week  Progress Toward Goals  OT Goals(current goals can now be found in the care plan section)  Progress towards OT goals: Progressing toward goals  Acute Rehab OT Goals OT Goal Formulation: With patient/family Time For Goal Achievement: 12/24/21 Potential to Achieve Goals: Good  Plan Discharge plan remains appropriate       AM-PAC OT "6  Clicks" Daily Activity     Outcome Measure   Help from another person eating meals?: None Help from another person taking care of personal grooming?: None Help from another person toileting, which includes using toliet, bedpan, or urinal?: A Little Help from another person bathing (including washing, rinsing, drying)?: A Lot Help from another person to put on and taking off regular upper body clothing?: None Help from another person to put on and taking off regular lower body clothing?: A Little 6 Click Score: 20    End of Session Equipment Utilized During Treatment: Gait belt;Rolling walker (2 wheels)  OT Visit Diagnosis: Unsteadiness on feet (R26.81);Muscle weakness (generalized) (M62.81)   Activity Tolerance Patient tolerated treatment well   Patient Left in chair;with call bell/phone within reach;with family/visitor present;with chair alarm set   Nurse Communication Mobility status        Time: 9201-0071 OT Time Calculation (min): 29 min  Charges: OT General Charges $OT Visit: 1 Visit OT Treatments $Self Care/Home Management : 8-22 mins $Therapeutic Activity: 8-22 mins    Leota Sauers, OTR/L 12/17/2021, 4:58 PM

## 2021-12-17 NOTE — Assessment & Plan Note (Addendum)
Acute hypercapnic and hypoxemic respiratory failure.  Alveolar hemorrhage.   Patient is slowly recovering, today she has no dyspnea and was able to ambulate with physical therapy.  Her 02 saturation is 100 on 2 L/min per [place] She is out of bed today.   Plan to continue respiratory support with supplemental 02 per [place] Bronchodilator therapy and airway clearing techniques with flutter valve and incentive spirometer.  Continue with prednisone taper, PJP prophylaxis with bactrim.  Check ambulatory oxymetry on room air, she may need supplemental home 02 at the time of discharge.

## 2021-12-17 NOTE — Assessment & Plan Note (Addendum)
Resolved septic shock. (Not present on admission).  Patient has completed antibiotic therapy, discontinued on 12/13/21.  She has been afebrile, wbc is 3,9  Cultures have been no growth.  Ok to change level of care to medical surg, Ok to discontinue telemetry monitoring.

## 2021-12-17 NOTE — Assessment & Plan Note (Signed)
Uncontrolled with Hypoglycemia/ steroid induced hyperglycemia Fasting glucose this am 58, capillary glucose, 86, 73, 150.  Plan to decrease basal insulin to 10 units and continue with insulin sliding scale for glucose cover and monitoring.  Discontinue pre meal insulin for now.   Continue with statin therapy.

## 2021-12-17 NOTE — Assessment & Plan Note (Signed)
Continue with nutritional supplements.  

## 2021-12-17 NOTE — Progress Notes (Addendum)
0530- POC glucose 70 pt is not symptomatic 0534- ice cream and an additional cup of apple juice offered. Will recheck in 15 minutes 0552- POC recheck 79 0652- POC glucose checked at this time -   86

## 2021-12-17 NOTE — Assessment & Plan Note (Signed)
Continue with antiacid therapy.  ?

## 2021-12-17 NOTE — Assessment & Plan Note (Signed)
Continue blood pressure control with amlodipine and losartan.  Systolic blood pressure has been 119 to 139 mmHg.

## 2021-12-17 NOTE — Progress Notes (Signed)
SATURATION QUALIFICATIONS: (This note is used to comply with regulatory documentation for home oxygen)  Patient Saturations on Room Air at Rest = 95%  Patient Saturations on Room Air while Ambulating = 95%  Patient Saturations on 2Liters of oxygen while Ambulating = 98%  Please briefly explain why patient needs home oxygen:Pt does not meet the requirement for home oxygen,  saturation sustained above 95% on room air

## 2021-12-17 NOTE — Assessment & Plan Note (Signed)
Renal function has been stable with serum cr at 0,31, K is 3,4 and serum bicarbonate at 28, Plan to add 40 kcl po bid today and follow up renal function in am.  Patient is tolerating po well.

## 2021-12-18 DIAGNOSIS — E1169 Type 2 diabetes mellitus with other specified complication: Secondary | ICD-10-CM

## 2021-12-18 DIAGNOSIS — D696 Thrombocytopenia, unspecified: Secondary | ICD-10-CM | POA: Diagnosis not present

## 2021-12-18 DIAGNOSIS — E440 Moderate protein-calorie malnutrition: Secondary | ICD-10-CM

## 2021-12-18 DIAGNOSIS — A419 Sepsis, unspecified organism: Secondary | ICD-10-CM | POA: Diagnosis not present

## 2021-12-18 DIAGNOSIS — D46Z Other myelodysplastic syndromes: Secondary | ICD-10-CM | POA: Diagnosis not present

## 2021-12-18 DIAGNOSIS — E785 Hyperlipidemia, unspecified: Secondary | ICD-10-CM

## 2021-12-18 LAB — BASIC METABOLIC PANEL
Anion gap: 6 (ref 5–15)
BUN: 21 mg/dL (ref 8–23)
CO2: 25 mmol/L (ref 22–32)
Calcium: 8.4 mg/dL — ABNORMAL LOW (ref 8.9–10.3)
Chloride: 101 mmol/L (ref 98–111)
Creatinine, Ser: 0.51 mg/dL (ref 0.44–1.00)
GFR, Estimated: >60 mL/min (ref >60)
Glucose, Bld: 294 mg/dL — ABNORMAL HIGH (ref 70–99)
Potassium: 4.9 mmol/L (ref 3.5–5.1)
Sodium: 132 mmol/L — ABNORMAL LOW (ref 135–145)

## 2021-12-18 LAB — CBC
HCT: 20.7 % — ABNORMAL LOW (ref 36.0–46.0)
Hemoglobin: 7.1 g/dL — ABNORMAL LOW (ref 12.0–15.0)
MCH: 30.7 pg (ref 26.0–34.0)
MCHC: 34.3 g/dL (ref 30.0–36.0)
MCV: 89.6 fL (ref 80.0–100.0)
Platelets: 28 10*3/uL — CL (ref 150–400)
RBC: 2.31 MIL/uL — ABNORMAL LOW (ref 3.87–5.11)
RDW: 14.5 % (ref 11.5–15.5)
WBC: 2.9 10*3/uL — ABNORMAL LOW (ref 4.0–10.5)
nRBC: 1.4 % — ABNORMAL HIGH (ref 0.0–0.2)

## 2021-12-18 LAB — GLUCOSE, CAPILLARY
Glucose-Capillary: 297 mg/dL — ABNORMAL HIGH (ref 70–99)
Glucose-Capillary: 195 mg/dL — ABNORMAL HIGH (ref 70–99)
Glucose-Capillary: 338 mg/dL — ABNORMAL HIGH (ref 70–99)
Glucose-Capillary: 454 mg/dL — ABNORMAL HIGH (ref 70–99)
Glucose-Capillary: 307 mg/dL — ABNORMAL HIGH (ref 70–99)

## 2021-12-18 LAB — PREPARE RBC (CROSSMATCH)

## 2021-12-18 MED ORDER — INSULIN ASPART 100 UNIT/ML IJ SOLN
12.0000 [IU] | Freq: Once | INTRAMUSCULAR | Status: AC
  Administered 2021-12-18: 12 [IU] via SUBCUTANEOUS

## 2021-12-18 MED ORDER — SODIUM CHLORIDE 0.9% IV SOLUTION: Freq: Once | INTRAVENOUS | Status: AC

## 2021-12-18 NOTE — Progress Notes (Signed)
Progress Note   Patient: Amanda Duarte MOQ:[id number] DOB: 11-Feb-1955 DOA: 12/01/2021     16 DOS: the patient was seen and examined on 12/18/2021   Brief hospital course: 66 yof w/ left breast cancer (Dx 04/2017 S/P left mastectomy and adjuvant radiation), HTN HLD T2DM and recent diagnosis of high-grade myeloid Neoplasm/myelodysplastic syndrome (Bone marrow biopsy 10/2 with confirmatory biopsy 10/11 revealing 7% blasts) thought to be caused by history of chemotherapy complicated by pancytopenia   Despite aggressive treatment with broad-spectrum intravenous antibiotics in the days that followed patient clinically worsened with recurrent fevers and worsening shortness of breath with progressively worsening acute hypoxic respiratory failure eventually prompting PCCM involvement and broadening of antibiotic coverage under the direction of infectious disease.    Hospital course was complicated by persisting pancytopenia requiring intermittent transfusions of blood products including platelets (1 pack on 11/4, 1 pack 11/16 and 1 pack 11/17),  and packed red blood cells (11/9).   Dr. Annamaria Boots with oncology has been following.  Current targets are transfusions for platelet count of less than 15 and hemoglobin of less than 7.   Unfortunately due to progressive respiratory failure patient eventually required intubation on 11/13 for progressive respiratory failure and transfer to the intensive care unit.  A bronchoscopy was performed at that time with BAL performed for cultures.  Repeat bronchoscopy was performed on 11/15 revealing evidence pulmonary hemorrhage, thought to be secondary to recurrent severe thrombocytopenia.  Hospital course was further complicated by the development of septic shock requiring intermittent vasopressors.   Patient was managed with Neo-Synephrine which has since been weaned off.  Patient also developed episodes of paroxysmal atrial fibrillation.  Amiodarone was initially given but later this  was discontinued on 11/14 after having episodes of polymorphic ventricular tachycardia and prolonged QTc.    Patient has since been weaned off of vasopressors.  Patient has been extubated on 11/16.   11/18: Transferred back to [facility] service.  Worked with PT 11/17 advised home health PT with RW 11/18: left IJ removed and RUE PICC line placed 11/20: ID discontinued Vancomycin ( MRSA PCR is negative, BAL cx and resp cx negative, blood cx negative) 12/15/21:Meropenem/micafungin stopped, plan to monitor off antibiotics.    Assessment and Plan: Acute respiratory distress syndrome (ARDS) (HCC) Acute hypercapnic and hypoxemic respiratory failure.  Alveolar hemorrhage.  -weaned to RA  Lobar pneumonia, unspecified organism ([place]) Resolved septic shock. (Not present on admission).  Patient has completed antibiotic therapy, discontinued on 12/13/21.  She has been afebrile, wbc is 3,9  Cultures have been no growth.  -abx stopped  Neutropenic fever (HCC) Cell count stable with wbc 3,9, Hgb 8,0 and plt 39, No evidence of active bleeding.  -patient has been afebrile and antibiotic therapy has been completed.   Essential hypertension Continue blood pressure control with amlodipine and losartan.  Systolic blood pressure has been 119 to 139 mmHg.   MDS (myelodysplastic syndrome), high grade [facility]) Patient follows up with Dr Annamaria Boots as outpatient.  Her overall prognosis is poor due to her MDS.  Holding chemotherapy until follow up as outpatient.  Plan for PRBC transfusion for hgb less than 7.5 or platelets less than 15,000.  -transfusion 11/25 pending  Hypokalemia -repleted  Type 2 diabetes mellitus with hyperlipidemia (HCC) -[facility]  Nutrition Status: Nutrition Problem: Moderate Malnutrition Etiology: chronic illness Signs/Symptoms: energy intake < or equal to 75% for > or equal to 1 month, mild fat depletion, mild muscle depletion Interventions: Ensure Enlive (each supplement provides 350kcal and  20  grams of protein), Magic cup, Boost Breeze, MVI    GERD (gastroesophageal reflux disease) Continue with antiacid therapy.       Subjective:  No SOB, no CP.   Physical Exam: Vitals:   12/17/21 1236 12/17/21 1957 12/18/21 0451 12/18/21 0500  BP: (!) 139/41 (!) 116/44 (!) 141/55   Pulse: 70 72 66   Resp:  20 16   Temp: 98.4 F (36.9 C) 98.4 F (36.9 C) 97.9 F (36.6 C)   TempSrc: Oral Oral    SpO2: 100% 100% 100%   Weight:    57.6 kg  Height:        General: Appearance:    Well developed, well nourished female in no acute distress     Lungs:     respirations unlabored  Heart:    Normal heart rate. Normal rhythm. No murmurs, rubs, or gallops.   MS:   All extremities are intact.   Neurologic:   Awake, alert       Family Communication: I spoke with patient's grandson at the bedside, we talked in detail about patient's condition, plan of care and prognosis and all questions were addressed.   Disposition: Status is: Inpatient Remains inpatient appropriate because: home once plts and hgb stable  Planned Discharge Destination: Home      Author: Geradine Girt, DO 12/18/2021 10:21 AM  For on call review [email or web address].

## 2021-12-18 NOTE — Progress Notes (Signed)
Pt CBG 454. Geradine Girt, DO paged and protocol followed. See new orders. Will continue with plan of care.  Post insulin admin. CBG rechecked, resulted 307

## 2021-12-18 NOTE — Plan of Care (Signed)
  Problem: Coping: Goal: Ability to adjust to condition or change in health will improve Outcome: Progressing   Problem: Clinical Measurements: Goal: Respiratory complications will improve Outcome: Progressing   Problem: Coping: Goal: Level of anxiety will decrease Outcome: Progressing   Problem: Safety: Goal: Ability to remain free from injury will improve Outcome: Progressing

## 2021-12-18 NOTE — Progress Notes (Signed)
Critical results communicated via secure chat with A. Zebedee Iba. Platelets 28 amd hgb 7.1   12/18/21 0034  Provider Notification  Provider Name/Title A. Zebedee Iba, NP  Date Provider Notified 12/18/21  Time Provider Notified 0630  Method of Notification  (secure chat)  Notification Reason Critical Result  Test performed and critical result Platelet ct 28 and hgb 7.1 ( below 7.5)  Date Critical Result Received 12/18/21  Time Critical Result Received [id number]

## 2021-12-19 DIAGNOSIS — J181 Lobar pneumonia, unspecified organism: Secondary | ICD-10-CM | POA: Diagnosis not present

## 2021-12-19 DIAGNOSIS — D696 Thrombocytopenia, unspecified: Secondary | ICD-10-CM | POA: Diagnosis not present

## 2021-12-19 LAB — BASIC METABOLIC PANEL
Anion gap: 15 (ref 5–15)
BUN: 24 mg/dL — ABNORMAL HIGH (ref 8–23)
CO2: 23 mmol/L (ref 22–32)
Calcium: 9.1 mg/dL (ref 8.9–10.3)
Chloride: 107 mmol/L (ref 98–111)
Creatinine, Ser: 0.48 mg/dL (ref 0.44–1.00)
GFR, Estimated: >60 mL/min (ref >60)
Glucose, Bld: 236 mg/dL — ABNORMAL HIGH (ref 70–99)
Potassium: 4.1 mmol/L (ref 3.5–5.1)
Sodium: 145 mmol/L (ref 135–145)

## 2021-12-19 LAB — CBC
HCT: 27.2 % — ABNORMAL LOW (ref 36.0–46.0)
Hemoglobin: 9.1 g/dL — ABNORMAL LOW (ref 12.0–15.0)
MCH: 29.8 pg (ref 26.0–34.0)
MCHC: 33.5 g/dL (ref 30.0–36.0)
MCV: 89.2 fL (ref 80.0–100.0)
Platelets: 33 10*3/uL — ABNORMAL LOW (ref 150–400)
RBC: 3.05 MIL/uL — ABNORMAL LOW (ref 3.87–5.11)
RDW: 14.6 % (ref 11.5–15.5)
WBC: 3.8 10*3/uL — ABNORMAL LOW (ref 4.0–10.5)
nRBC: 1.3 % — ABNORMAL HIGH (ref 0.0–0.2)

## 2021-12-19 LAB — GLUCOSE, CAPILLARY
Glucose-Capillary: 204 mg/dL — ABNORMAL HIGH (ref 70–99)
Glucose-Capillary: 172 mg/dL — ABNORMAL HIGH (ref 70–99)
Glucose-Capillary: 474 mg/dL — ABNORMAL HIGH (ref 70–99)
Glucose-Capillary: 458 mg/dL — ABNORMAL HIGH (ref 70–99)
Glucose-Capillary: 461 mg/dL — ABNORMAL HIGH (ref 70–99)
Glucose-Capillary: 222 mg/dL — ABNORMAL HIGH (ref 70–99)

## 2021-12-19 MED ORDER — INSULIN ASPART 100 UNIT/ML IJ SOLN: 8.0000 [IU] | Freq: Three times a day (TID) | INTRAMUSCULAR | Status: DC

## 2021-12-19 MED ORDER — INSULIN ASPART 100 UNIT/ML IJ SOLN
0.0000 [IU] | Freq: Three times a day (TID) | INTRAMUSCULAR | Status: DC
  Administered 2021-12-19 – 2021-12-20 (×3): 15 [IU] via SUBCUTANEOUS
  Administered 2021-12-21: 2 [IU] via SUBCUTANEOUS
  Administered 2021-12-21: 8 [IU] via SUBCUTANEOUS

## 2021-12-19 MED ORDER — INSULIN ASPART 100 UNIT/ML IJ SOLN
0.0000 [IU] | Freq: Every day | INTRAMUSCULAR | Status: DC
  Administered 2021-12-19: 2 [IU] via SUBCUTANEOUS
  Administered 2021-12-20: 3 [IU] via SUBCUTANEOUS
  Administered 2021-12-21: 2 [IU] via SUBCUTANEOUS

## 2021-12-19 MED ORDER — INSULIN ASPART 100 UNIT/ML IJ SOLN
10.0000 [IU] | Freq: Three times a day (TID) | INTRAMUSCULAR | Status: DC
  Administered 2021-12-19 – 2021-12-22 (×7): 10 [IU] via SUBCUTANEOUS

## 2021-12-19 MED ORDER — INSULIN DETEMIR 100 UNIT/ML ~~LOC~~ SOLN
15.0000 [IU] | Freq: Two times a day (BID) | SUBCUTANEOUS | Status: DC
  Administered 2021-12-19 – 2021-12-20 (×3): 15 [IU] via SUBCUTANEOUS
  Filled 2021-12-19 (×4): qty 0.15

## 2021-12-19 MED ORDER — INSULIN ASPART 100 UNIT/ML IJ SOLN
8.0000 [IU] | Freq: Three times a day (TID) | INTRAMUSCULAR | Status: DC
  Administered 2021-12-19: 8 [IU] via SUBCUTANEOUS

## 2021-12-19 NOTE — Progress Notes (Signed)
Pt CBG 458 - [phone] Vann, Jessica U, DO notified via page. Charge Nurse Rise Paganini aware. See new orders.

## 2021-12-19 NOTE — Progress Notes (Signed)
Progress Note   Patient: Amanda Duarte VZC:[id number] DOB: 24-Aug-1955 DOA: 12/01/2021     17 DOS: the patient was seen and examined on 12/19/2021   Brief hospital course: 36 yof w/ left breast cancer (Dx 04/2017 S/P left mastectomy and adjuvant radiation), HTN HLD T2DM and recent diagnosis of high-grade myeloid Neoplasm/myelodysplastic syndrome (Bone marrow biopsy 10/2 with confirmatory biopsy 10/11 revealing 7% blasts) thought to be caused by history of chemotherapy complicated by pancytopenia   Despite aggressive treatment with broad-spectrum intravenous antibiotics in the days that followed patient clinically worsened with recurrent fevers and worsening shortness of breath with progressively worsening acute hypoxic respiratory failure eventually prompting PCCM involvement and broadening of antibiotic coverage under the direction of infectious disease.    Hospital course was complicated by persisting pancytopenia requiring intermittent transfusions of blood products including platelets (1 pack on 11/4, 1 pack 11/16 and 1 pack 11/17),  and packed red blood cells (11/9).   Dr. Annamaria Boots with oncology has been following.  Current targets are transfusions for platelet count of less than 15 and hemoglobin of less than 7.   Unfortunately due to progressive respiratory failure patient eventually required intubation on 11/13 for progressive respiratory failure and transfer to the intensive care unit.  A bronchoscopy was performed at that time with BAL performed for cultures.  Repeat bronchoscopy was performed on 11/15 revealing evidence pulmonary hemorrhage, thought to be secondary to recurrent severe thrombocytopenia.  Hospital course was further complicated by the development of septic shock requiring intermittent vasopressors.   Patient was managed with Neo-Synephrine which has since been weaned off.  Patient also developed episodes of paroxysmal atrial fibrillation.  Amiodarone was initially given but later this  was discontinued on 11/14 after having episodes of polymorphic ventricular tachycardia and prolonged QTc.    Patient has since been weaned off of vasopressors.  Patient has been extubated on 11/16.   11/18: Transferred back to [facility] service.  Worked with PT 11/17 advised home health PT with RW 11/18: left IJ removed and RUE PICC line placed 11/20: ID discontinued Vancomycin ( MRSA PCR is negative, BAL cx and resp cx negative, blood cx negative) 12/15/21:Meropenem/micafungin stopped, plan to monitor off antibiotics.    Assessment and Plan: Acute respiratory distress syndrome (ARDS) (HCC) Acute hypercapnic and hypoxemic respiratory failure.  Alveolar hemorrhage.  -weaned to RA  Lobar pneumonia, unspecified organism ([place]) Resolved septic shock. (Not present on admission).  Patient has completed antibiotic therapy, discontinued on 12/13/21.  She has been afebrile, wbc is 3,9  Cultures have been no growth.  -abx stopped- no fever  Neutropenic fever (HCC) Cell count stable with wbc 3,9, Hgb 8,0 and plt 39, No evidence of active bleeding.  -patient has been afebrile and antibiotic therapy has been completed.   Essential hypertension Continue blood pressure control with amlodipine and losartan.   MDS (myelodysplastic syndrome), high grade [facility]) Patient follows up with Dr Annamaria Boots as outpatient.  Her overall prognosis is poor due to her MDS.  Holding chemotherapy until follow up as outpatient.  Plan for PRBC transfusion for hgb less than 7.5 or platelets less than 15,000.  -transfusion 11/25   Hypokalemia -repleted  Type 2 diabetes mellitus with hyperlipidemia (HCC) -SSI  Nutrition Status: Nutrition Problem: Moderate Malnutrition Etiology: chronic illness Signs/Symptoms: energy intake < or equal to 75% for > or equal to 1 month, mild fat depletion, mild muscle depletion Interventions: Ensure Enlive (each supplement provides 350kcal and 20 grams of protein), Magic cup, Boost Breeze,  MVI  GERD (gastroesophageal reflux disease) Continue with antiacid therapy.       Subjective:  Up walking, no SOB  Physical Exam: Vitals:   12/18/21 1732 12/18/21 2033 12/19/21 0433 12/19/21 0827  BP: (!) 142/66 (!) 148/62 (!) 170/69 (!) 136/59  Pulse: 68 69 61   Resp: _0 Temp: 98.4 F (36.9 C) 97.7 F (36.5 C) 97.7 F (36.5 C)   TempSrc: Oral Oral Oral   SpO2: 100% 100% 100%   Weight:      Height:         General: Appearance:    Well developed, well nourished female in no acute distress     Lungs:     respirations unlabored  Heart:    Normal heart rate.   MS:   All extremities are intact.   Neurologic:   Awake, alert         Family Communication: I spoke with patient's grandson at the bedside, we talked in detail about patient's condition, plan of care and prognosis and all questions were addressed.   Disposition: Status is: Inpatient Remains inpatient appropriate because: home once plts and hgb stable  Planned Discharge Destination: Home in AM      Author: Geradine Girt, DO 12/19/2021 11:26 AM  For on call review [email or web address].

## 2021-12-20 ENCOUNTER — Ambulatory Visit: Payer: Medicare Other

## 2021-12-20 ENCOUNTER — Inpatient Hospital Stay: Payer: Medicare Other

## 2021-12-20 ENCOUNTER — Other Ambulatory Visit: Payer: Medicare Other

## 2021-12-20 ENCOUNTER — Inpatient Hospital Stay: Payer: Medicare Other | Admitting: Hematology

## 2021-12-20 DIAGNOSIS — D46Z Other myelodysplastic syndromes: Secondary | ICD-10-CM | POA: Diagnosis not present

## 2021-12-20 DIAGNOSIS — D696 Thrombocytopenia, unspecified: Secondary | ICD-10-CM | POA: Diagnosis not present

## 2021-12-20 LAB — BPAM RBC
Blood Product Expiration Date: 202312232359
ISSUE DATE / TIME: 202311251436
Unit Type and Rh: 5100

## 2021-12-20 LAB — GLUCOSE, CAPILLARY
Glucose-Capillary: 552 mg/dL (ref 70–99)
Glucose-Capillary: 419 mg/dL — ABNORMAL HIGH (ref 70–99)
Glucose-Capillary: 73 mg/dL (ref 70–99)
Glucose-Capillary: 165 mg/dL — ABNORMAL HIGH (ref 70–99)
Glucose-Capillary: 352 mg/dL — ABNORMAL HIGH (ref 70–99)
Glucose-Capillary: 469 mg/dL — ABNORMAL HIGH (ref 70–99)
Glucose-Capillary: 268 mg/dL — ABNORMAL HIGH (ref 70–99)

## 2021-12-20 LAB — CBC
HCT: 24.8 % — ABNORMAL LOW (ref 36.0–46.0)
Hemoglobin: 8.5 g/dL — ABNORMAL LOW (ref 12.0–15.0)
MCH: 30.4 pg (ref 26.0–34.0)
MCHC: 34.3 g/dL (ref 30.0–36.0)
MCV: 88.6 fL (ref 80.0–100.0)
Platelets: 35 10*3/uL — ABNORMAL LOW (ref 150–400)
RBC: 2.8 MIL/uL — ABNORMAL LOW (ref 3.87–5.11)
RDW: 14.5 % (ref 11.5–15.5)
WBC: 3.6 10*3/uL — ABNORMAL LOW (ref 4.0–10.5)
nRBC: 0.8 % — ABNORMAL HIGH (ref 0.0–0.2)

## 2021-12-20 LAB — BASIC METABOLIC PANEL
Anion gap: 7 (ref 5–15)
BUN: 28 mg/dL — ABNORMAL HIGH (ref 8–23)
CO2: 27 mmol/L (ref 22–32)
Calcium: 8.7 mg/dL — ABNORMAL LOW (ref 8.9–10.3)
Chloride: 102 mmol/L (ref 98–111)
Creatinine, Ser: 0.42 mg/dL — ABNORMAL LOW (ref 0.44–1.00)
GFR, Estimated: >60 mL/min (ref >60)
Glucose, Bld: 116 mg/dL — ABNORMAL HIGH (ref 70–99)
Potassium: 3.7 mmol/L (ref 3.5–5.1)
Sodium: 136 mmol/L (ref 135–145)

## 2021-12-20 LAB — TYPE AND SCREEN
ABO/RH(D): O POS
Antibody Screen: NEGATIVE
Unit division: 0

## 2021-12-20 MED ORDER — HYDRALAZINE HCL 20 MG/ML IJ SOLN: 10.0000 mg | INTRAMUSCULAR | Status: DC | PRN

## 2021-12-20 MED ORDER — METOPROLOL TARTRATE 5 MG/5ML IV SOLN: 5.0000 mg | INTRAVENOUS | Status: DC | PRN

## 2021-12-20 MED ORDER — IPRATROPIUM-ALBUTEROL 0.5-2.5 (3) MG/3ML IN SOLN: 3.0000 mL | RESPIRATORY_TRACT | Status: DC | PRN

## 2021-12-20 MED ORDER — TRAZODONE HCL 50 MG PO TABS: 50.0000 mg | ORAL_TABLET | Freq: Every evening | ORAL | Status: DC | PRN

## 2021-12-20 MED ORDER — ONDANSETRON HCL 4 MG/2ML IJ SOLN: 4.0000 mg | Freq: Four times a day (QID) | INTRAMUSCULAR | Status: DC | PRN

## 2021-12-20 NOTE — Progress Notes (Signed)
Amanda Duarte   DOB:Jul 27, 1955   SJ#:[id number]   TML#:[id number]  Hem/onc follow up   Subjective: Patient is stable, last blood transfusion was 2 days ago, and was.  She has being afebrile, no bleeding.  She is overall feeling better.  One of her daughters was at the bedside, her daughter and power of attorney Amanda Duarte was on the phone when I saw her. I did use the online Spanish interpreter service for her today.   Objective:  Vitals:   12/20/21 1103 12/20/21 2019  BP: (!) 144/54 (!) 111/51  Pulse: 75 71  Resp:  16  Temp: 98.8 F (37.1 C) 98.4 F (36.9 C)  SpO2: 99% 100%    Body mass index is 21.01 kg/m. No intake or output data in the 24 hours ending 12/20/21 2035    Sclerae unicteric  Alert and oriented No leg edema       CBG (last 3)  Recent Labs    12/20/21 1140 12/20/21 1614 12/20/21 1720  GLUCAP 165* 352* 469*     Labs:  Urine Studies No results for input(s): "UHGB", "CRYS" in the last 72 hours.  Invalid input(s): "UACOL", "UAPR", "USPG", "UPH", "UTP", "UGL", "UKET", "UBIL", "UNIT", "UROB", "ULEU", "UEPI", "UWBC", "URBC", "UBAC", "CAST", "UCOM", "BILUA"  Basic Metabolic Panel: Recent Labs  Lab 12/16/21 0323 12/17/21 0307 12/18/21 0341 12/19/21 0304 12/20/21 0249  NA 137 139 132* 145 136  K 3.5 3.4* 4.9 4.1 3.7  CL 105 105 101 107 102  CO2 '28 28 25 23 27  '$ GLUCOSE 67* 58* 294* 236* 116*  BUN '19 16 21 '$ 24* 28*  CREATININE 0.35* 0.31* 0.51 0.48 0.42*  CALCIUM 8.7* 8.6* 8.4* 9.1 8.7*   GFR Estimated Creatinine Clearance: 49.7 mL/min (A) (by C-G formula based on SCr of 0.42 mg/dL (L)). Liver Function Tests: No results for input(s): "AST", "ALT", "ALKPHOS", "BILITOT", "PROT", "ALBUMIN" in the last 168 hours.  No results for input(s): "LIPASE", "AMYLASE" in the last 168 hours. No results for input(s): "AMMONIA" in the last 168 hours. Coagulation profile No results for input(s): "INR", "PROTIME" in the last 168 hours.   CBC: Recent Labs  Lab  12/14/21 0929 12/15/21 0311 12/16/21 0323 12/18/21 0341 12/19/21 0304 12/20/21 0249  WBC 3.1* 2.8* 3.9* 2.9* 3.8* 3.6*  NEUTROABS 0.6* 0.5* 0.9*  --   --   --   HGB 8.8* 7.8* 8.0* 7.1* 9.1* 8.5*  HCT 26.2* 22.9* 23.7* 20.7* 27.2* 24.8*  MCV 90.3 89.1 89.1 89.6 89.2 88.6  PLT 13* 9* 39* 28* 33* 35*   Cardiac Enzymes: No results for input(s): "CKTOTAL", "CKMB", "CKMBINDEX", "TROPONINI" in the last 168 hours. BNP: Invalid input(s): "POCBNP" CBG: Recent Labs  Lab 12/19/21 2202 12/20/21 0736 12/20/21 1140 12/20/21 1614 12/20/21 1720  GLUCAP 222* 73 165* 352* 469*   D-Dimer No results for input(s): "DDIMER" in the last 72 hours. Hgb A1c No results for input(s): "HGBA1C" in the last 72 hours. Lipid Profile No results for input(s): "CHOL", "HDL", "LDLCALC", "TRIG", "CHOLHDL", "LDLDIRECT" in the last 72 hours. Thyroid function studies No results for input(s): "TSH", "T4TOTAL", "T3FREE", "THYROIDAB" in the last 72 hours.  Invalid input(s): "FREET3" Anemia work up No results for input(s): "VITAMINB12", "FOLATE", "FERRITIN", "TIBC", "IRON", "RETICCTPCT" in the last 72 hours. Microbiology No results found for this or any previous visit (from the past 240 hour(s)).     Studies:  No results found.  Assessment: 66 y.o. female   Sepsis and septic shock from left lower lobe pneumonia  and aspiration pneumonia, much imporved  Hypoxic respiratory failure secondary to pneumonia, resolved  Neutropenic fever, resolved  Pancytopenia, secondary to MDS and chemo Hyponatremia Type 2 diabetes and hyperglycemia History of breast cancer HTN  and PAF and monomorphic VT   Plan:  -Her pancytopenia overall has improved, last platelet transfusion was 5 days ago. OK to discharge her from hematological standpoint. -We discussed that she will have chronic pancytopenia, I will schedule lab and blood transfusion twice every week for her -I discussed the option of continuing chemotherapy, and  to be evaluated for bone marrow transplant at the [facility], vs supportive care alone with blood transfusion etc.  We the overall poor prognosis due to her high risk MDS, high risk of infection, sepsis, even death from chemotherapy.  She is uncertain what she would like to do next, and would like to discuss with her family. -We also discussed CODE STATUS, I encouraged her to consider DNR, she will think about it.  -Patient does not seem to be very engaged in the conversation, I am not sure how much she understands. I reviewed the above with two of her daughters  and answered their questions. They voiced good understanding the severity of her disease.  -I plan to see her back in my office in 1-2 weeks, with additional lab and blood transfusion appointments.    Amanda Merle, MD 12/20/2021

## 2021-12-20 NOTE — Progress Notes (Signed)
PROGRESS NOTE    Amanda Duarte  FIE:[id number] DOB: 1955/07/16 DOA: 12/01/2021 PCP: [name], NP   Brief Narrative:  66 year old with history of left breast cancer status post left mastectomy and adjuvant radiation, HTN, HLD, DM 2, high-grade myeloid neoplasm/myelodysplastic syndrome confirmed on bone marrow biopsy in October admitted to the hospital for shortness of breath, fever and hypoxic respiratory failure.  Patient was diagnosed with left lower lobe pneumonia and started on broad-spectrum antibiotics.  Due to progressive respiratory symptoms, patient was intubated on 11/13, bronchoscopy was performed on 11/13.  Thereafter eventually extubated on 11/16.  Patient was seen by ID and oncology.  Antibiotics were adjusted.  Patient also received PRBC and platelet transfusion during hospitalization.  Eventually transferred to [facility] on 11/18.  All the patient's cultures remain negative therefore on 11/22 patient's meropenem, micafungin were discontinued.  Prednisone was placed on taper and recommended continuing Bactrim while on steroids.   Assessment & Plan:  Active Problems:   Acute respiratory distress syndrome (ARDS) (HCC)   Lobar pneumonia, unspecified organism (HCC)   Neutropenic fever (HCC)   Essential hypertension   MDS (myelodysplastic syndrome), high grade (HCC)   Hypokalemia   Type 2 diabetes mellitus with hyperlipidemia (HCC)   Malnutrition of moderate degree   GERD (gastroesophageal reflux disease)     Assessment and Plan:  Lobar pneumonia, unspecified organism ([place]) Acute respiratory distress/failure Initially due to worsening respiratory status patient was intubated on 11/13 and later extubated on 11/16.  Underwent bronchoscopy.  All the cultures remain negative.  After multiple days of broad-spectrum IV antibiotics and micafungin antibiotics were changed to Bactrim.  Due to concerns of PJP ID recommended continuing Bactrim prophylaxis while on steroids. Bronchodilators  as needed, I-S/flutter valve Plan to remove her PICC line  Neutropenic fever ([place]) Hemoglobin, WBC and platelets are now stable.  This is secondary to MDS.  Cultures remain negative.  Essential hypertension Continue home meds, IV as needed  MDS (myelodysplastic syndrome), high grade ([place]) Patient follows up with Dr Annamaria Boots as outpatient.  Her overall prognosis is poor due to her MDS.  Holding chemotherapy until follow up as outpatient.  Plan for PRBC transfusion for hgb less than 7.5 or platelets less than 15,000.   Hypokalemia As needed repletion  Type 2 diabetes mellitus with hyperlipidemia ([place]), hyper glycemia steroid-induced Has been very labile during the hospitalization. -Currently on Levemir 15 units twice daily, sliding scale and Accu-Cheks. -Lipitor 40 mg daily  Malnutrition of moderate degree Continue with nutritional supplements.   GERD (gastroesophageal reflux disease) Continue with antiacid therapy.   DVT prophylaxis: SCDs Start: 12/02/21 1022 Code Status: Full Family Communication:    Status is: Inpatient Remains inpatient appropriate because: Patient has very labile blood sugars, ongoing management.  Hopefully home in next 24 hours   Nutritional status    Signs/Symptoms: energy intake < or equal to 75% for > or equal to 1 month, mild fat depletion, mild muscle depletion  Interventions: Ensure Enlive (each supplement provides 350kcal and 20 grams of protein), Magic cup, Boost Breeze, MVI  Body mass index is 21.01 kg/m.         Subjective:  Seen and examined at bedside, tells me she feels a little better today.  Remains afebrile over last 24 hours.  Examination:  General exam: Appears calm and comfortable  Respiratory system: Clear to auscultation. Respiratory effort normal. Cardiovascular system: S1 & S2 heard, RRR. No JVD, murmurs, rubs, gallops or clicks. No pedal edema. Gastrointestinal system: Abdomen  is nondistended, soft and nontender.  No organomegaly or masses felt. Normal bowel sounds heard. Central nervous system: Alert and oriented. No focal neurological deficits. Extremities: Symmetric 5 x 5 power. Skin: No rashes, lesions or ulcers Psychiatry: Judgement and insight appear normal. Mood & affect appropriate.   Right upper extremity PICC line in place  Objective: Vitals:   12/19/21 1210 12/19/21 2204 12/20/21 0347 12/20/21 0500  BP: (!) 139/53 (!) 141/59 (!) 116/47   Pulse: 67 65 88   Resp: _0 Temp: (!) 97.4 F (36.3 C) 98.8 F (37.1 C) 98.3 F (36.8 C)   TempSrc: Oral Oral Oral   SpO2: 100% 100% 100%   Weight:    48.8 kg  Height:        Intake/Output Summary (Last 24 hours) at 12/20/2021 0844 Last data filed at 12/19/2021 1210 Gross per 24 hour  Intake --  Output 1 ml  Net -1 ml   Filed Weights   12/16/21 0500 12/18/21 0500 12/20/21 0500  Weight: 57.1 kg 57.6 kg 48.8 kg     Data Reviewed:   CBC: Recent Labs  Lab 12/14/21 0929 12/15/21 0311 12/16/21 0323 12/18/21 0341 12/19/21 0304 12/20/21 0249  WBC 3.1* 2.8* 3.9* 2.9* 3.8* 3.6*  NEUTROABS 0.6* 0.5* 0.9*  --   --   --   HGB 8.8* 7.8* 8.0* 7.1* 9.1* 8.5*  HCT 26.2* 22.9* 23.7* 20.7* 27.2* 24.8*  MCV 90.3 89.1 89.1 89.6 89.2 88.6  PLT 13* 9* 39* 28* 33* 35*   Basic Metabolic Panel: Recent Labs  Lab 12/16/21 0323 12/17/21 0307 12/18/21 0341 12/19/21 0304 12/20/21 0249  NA 137 139 132* 145 136  K 3.5 3.4* 4.9 4.1 3.7  CL 105 105 101 107 102  CO2 _1 GLUCOSE 67* 58* 294* 236* 116*  BUN _2 24* 28*  CREATININE 0.35* 0.31* 0.51 0.48 0.42*  CALCIUM 8.7* 8.6* 8.4* 9.1 8.7*   GFR: Estimated Creatinine Clearance: 49.7 mL/min (A) (by C-G formula based on SCr of 0.42 mg/dL (L)). Liver Function Tests: No results for input(s): "AST", "ALT", "ALKPHOS", "BILITOT", "PROT", "ALBUMIN" in the last 168 hours. No results for input(s): "LIPASE", "AMYLASE" in the last 168 hours. No results for input(s): "AMMONIA" in  the last 168 hours. Coagulation Profile: No results for input(s): "INR", "PROTIME" in the last 168 hours. Cardiac Enzymes: No results for input(s): "CKTOTAL", "CKMB", "CKMBINDEX", "TROPONINI" in the last 168 hours. BNP (last 3 results) No results for input(s): "PROBNP" in the last 8760 hours. HbA1C: No results for input(s): "HGBA1C" in the last 72 hours. CBG: Recent Labs  Lab 12/19/21 1341 12/19/21 1739 12/19/21 1946 12/19/21 2202 12/20/21 0736  GLUCAP 458* 461* 419* 222* 73   Lipid Profile: No results for input(s): "CHOL", "HDL", "LDLCALC", "TRIG", "CHOLHDL", "LDLDIRECT" in the last 72 hours. Thyroid Function Tests: No results for input(s): "TSH", "T4TOTAL", "FREET4", "T3FREE", "THYROIDAB" in the last 72 hours. Anemia Panel: No results for input(s): "VITAMINB12", "FOLATE", "FERRITIN", "TIBC", "IRON", "RETICCTPCT" in the last 72 hours. Sepsis Labs: No results for input(s): "PROCALCITON", "LATICACIDVEN" in the last 168 hours.  No results found for this or any previous visit (from the past 240 hour(s)).       Radiology Studies: No results found.      Scheduled Meds:  sodium chloride   Intravenous Once   amLODipine  5 mg Oral Daily   atorvastatin  40 mg Oral Daily   Chlorhexidine Gluconate Cloth  6  each Topical Daily   docusate sodium  100 mg Oral BID   feeding supplement (GLUCERNA SHAKE)  237 mL Oral TID BM   insulin aspart  0-15 Units Subcutaneous TID WC   insulin aspart  0-5 Units Subcutaneous QHS   insulin aspart  10 Units Subcutaneous TID WC   insulin detemir  15 Units Subcutaneous BID   lidocaine  1 patch Transdermal Q24H   losartan  50 mg Oral Daily   multivitamin with minerals  1 tablet Oral Daily   pantoprazole  40 mg Oral BID   polyethylene glycol  17 g Oral Daily   predniSONE  50 mg Oral Q breakfast   Followed by   Derrill Memo ON 12/26/2021] predniSONE  40 mg Oral Q breakfast   Followed by   Derrill Memo ON 01/02/2022] predniSONE  30 mg Oral Q breakfast    Followed by   Derrill Memo ON 01/09/2022] predniSONE  20 mg Oral Q breakfast   Followed by   Derrill Memo ON 01/16/2022] predniSONE  10 mg Oral Q breakfast   sodium chloride flush  10-40 mL Intracatheter Q12H   sulfamethoxazole-trimethoprim  1 tablet Oral Once per day on Mon Wed Fri   Thrombi-Pad  1 each Topical Once   Continuous Infusions:  sodium chloride Stopped (12/07/21 1409)     LOS: 18 days   Time spent= 35 mins    Tonia Avino Arsenio Loader, MD [facility]  If 7PM-7AM, please contact night-coverage  12/20/2021, 8:44 AM

## 2021-12-20 NOTE — Progress Notes (Signed)
Physical Therapy Treatment and Discharge from Acute PT Patient Details Name: Amanda Duarte MRN: [id number] DOB: 1955-09-05 Today's Date: 12/20/2021   History of Present Illness Pt is a 66 year old woman admitted on 11/9 with fever, weakness, hyperglycemia and AMS. 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 Pt with increased hypoxia on 11/13, intubated 11/13- 11/16 with multilobar PNA. PMH: breast cancer s/p L mastectomy/chemo/radiation, HTN, HLD, anemia, DM2.    PT Comments    Pt mobilizing at supervision level now and has met acute PT goals.  Pt reports family is attempting to make 6-7 steps into home easier for her to negotiate (possible ramp?).  Pt will have some assist from family upon d/c.  Pt reports ambulating with her daughter this morning and happy to be moving more.  Will request mobility specialist to check on pt however since pt has met PT goals, PT to sign off at this time.   Recommendations for follow up therapy are one component of a multi-disciplinary discharge planning process, led by the attending physician.  Recommendations may be updated based on patient status, additional functional criteria and insurance authorization.  Follow Up Recommendations  Home health PT     Assistance Recommended at Discharge Frequent or constant Supervision/Assistance  Patient can return home with the following A little help with bathing/dressing/bathroom;Assistance with cooking/housework;Assist for transportation;Help with stairs or ramp for entrance;A little help with walking and/or transfers   Equipment Recommendations  Rolling walker (2 wheels)    Recommendations for Other Services       Precautions / Restrictions Precautions Precautions: Fall Precaution Comments: The pt's primary language is Spanish, however she understands some basic English     Mobility  Bed Mobility Overal bed mobility: Modified Independent                  Transfers Overall transfer level: Needs  assistance Equipment used: Rolling walker (2 wheels) Transfers: Sit to/from Stand Sit to Stand: Supervision                Ambulation/Gait Ambulation/Gait assistance: Min guard, Supervision Gait Distance (Feet): 400 Feet Assistive device: Rolling walker (2 wheels) Gait Pattern/deviations: Step-through pattern, Decreased stride length       General Gait Details: cues for RW positioning, steady with RW, denies symptoms   Stairs             Wheelchair Mobility    Modified Rankin (Stroke Patients Only)       Balance                                            Cognition Arousal/Alertness: Awake/alert Behavior During Therapy: WFL for tasks assessed/performed Overall Cognitive Status: Within Functional Limits for tasks assessed                                 General Comments: rehab tech assisted with translation, no family present, pt also does understand basic English        Exercises      General Comments        Pertinent Vitals/Pain Pain Assessment Pain Assessment: No/denies pain    Home Living                          Prior Function  PT Goals (current goals can now be found in the care plan section) Progress towards PT goals: Goals met/education completed, patient discharged from PT    Frequency           PT Plan Current plan remains appropriate    Co-evaluation              AM-PAC PT "6 Clicks" Mobility   Outcome Measure  Help needed turning from your back to your side while in a flat bed without using bedrails?: None Help needed moving from lying on your back to sitting on the side of a flat bed without using bedrails?: None Help needed moving to and from a bed to a chair (including a wheelchair)?: A Little Help needed standing up from a chair using your arms (e.g., wheelchair or bedside chair)?: A Little Help needed to walk in hospital room?: A Little Help needed  climbing 3-5 steps with a railing? : A Little 6 Click Score: 20    End of Session   Activity Tolerance: Patient tolerated treatment well Patient left: in bed;with call bell/phone within reach Nurse Communication: Mobility status PT Visit Diagnosis: Muscle weakness (generalized) (M62.81);Difficulty in walking, not elsewhere classified (R26.2)     Time: 1214-1227 PT Time Calculation (min) (ACUTE ONLY): 13 min  Charges:  $Gait Training: 8-22 mins                     Arlyce Dice, DPT Physical Therapist Acute Rehabilitation Services Preferred contact method: Secure Chat Weekend Pager Only: [phone] Office: [place] 12/20/2021, 3:34 PM

## 2021-12-21 ENCOUNTER — Inpatient Hospital Stay: Payer: Medicare Other

## 2021-12-21 DIAGNOSIS — E1165 Type 2 diabetes mellitus with hyperglycemia: Secondary | ICD-10-CM | POA: Diagnosis not present

## 2021-12-21 DIAGNOSIS — Z794 Long term (current) use of insulin: Secondary | ICD-10-CM | POA: Diagnosis not present

## 2021-12-21 LAB — BASIC METABOLIC PANEL
Anion gap: 12 (ref 5–15)
BUN: 25 mg/dL — ABNORMAL HIGH (ref 8–23)
CO2: 22 mmol/L (ref 22–32)
Calcium: 9.1 mg/dL (ref 8.9–10.3)
Chloride: 100 mmol/L (ref 98–111)
Creatinine, Ser: 0.59 mg/dL (ref 0.44–1.00)
GFR, Estimated: >60 mL/min (ref >60)
Glucose, Bld: 103 mg/dL — ABNORMAL HIGH (ref 70–99)
Potassium: 3.5 mmol/L (ref 3.5–5.1)
Sodium: 134 mmol/L — ABNORMAL LOW (ref 135–145)

## 2021-12-21 LAB — GLUCOSE, CAPILLARY
Glucose-Capillary: 75 mg/dL (ref 70–99)
Glucose-Capillary: 133 mg/dL — ABNORMAL HIGH (ref 70–99)
Glucose-Capillary: 275 mg/dL — ABNORMAL HIGH (ref 70–99)
Glucose-Capillary: 244 mg/dL — ABNORMAL HIGH (ref 70–99)

## 2021-12-21 LAB — MAGNESIUM: Magnesium: 1.7 mg/dL (ref 1.7–2.4)

## 2021-12-21 MED ORDER — INSULIN GLARGINE-YFGN 100 UNIT/ML ~~LOC~~ SOLN
20.0000 [IU] | Freq: Every day | SUBCUTANEOUS | Status: DC
  Administered 2021-12-21 – 2021-12-22 (×2): 20 [IU] via SUBCUTANEOUS
  Filled 2021-12-21 (×2): qty 0.2

## 2021-12-21 MED ORDER — INSULIN GLARGINE-YFGN 100 UNIT/ML ~~LOC~~ SOLN
10.0000 [IU] | Freq: Every day | SUBCUTANEOUS | Status: DC
  Administered 2021-12-21: 10 [IU] via SUBCUTANEOUS
  Filled 2021-12-21 (×2): qty 0.1

## 2021-12-21 NOTE — Progress Notes (Signed)
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  Patient was diagnosed with left lower lobe pneumonia and started on broad-spectrum antibiotics.  Due to progressive respiratory symptoms, patient was intubated on 11/13, bronchoscopy was performed on 11/13.  Thereafter eventually extubated on 11/16.  Patient was seen by ID and oncology.  Antibiotics were adjusted.  Patient also received PRBC and platelet transfusion during hospitalization.  Eventually transferred to [facility] on 11/18.  All the patient's cultures remain negative therefore on 11/22 patient's meropenem, micafungin were discontinued.  Prednisone was placed on taper and recommended continuing Bactrim while on steroids.   Assessment & Plan:  Active Problems:   Acute respiratory distress syndrome (ARDS) (HCC)   Lobar pneumonia, unspecified organism (HCC)   Neutropenic fever (HCC)   Essential hypertension   MDS (myelodysplastic syndrome), high grade (HCC)   Hypokalemia   Type 2 diabetes mellitus with hyperlipidemia (HCC)   Malnutrition of moderate degree   GERD (gastroesophageal reflux disease)     Assessment and Plan: Type 2 diabetes mellitus with hyperlipidemia ([place]), VERY LABILE BLOOD GLUCOSE Has been very labile during the hospitalization. BS ranging from 70-400+ - Will increase daytime Levemir to 20 units at bedtime reduce it to 10 units.  Continue sliding scale.  I anticipate some fluctuation especially while she is on steroids -Lipitor 40 mg daily  Lobar pneumonia, unspecified organism ([place]) Acute respiratory distress/failure Initially due to worsening  respiratory status patient was intubated on 11/13 and later extubated on 11/16.  Underwent bronchoscopy.  All the cultures remain negative.  After multiple days of broad-spectrum IV antibiotics and micafungin antibiotics were changed to Bactrim.  Due to concerns of PJP ID recommended continuing Bactrim prophylaxis while on steroids. Bronchodilators as needed, I-S/flutter valve PICC line removed 11/27  Neutropenic fever (HCC) Hemoglobin, WBC and platelets are now stable.  This is secondary to MDS.  Cultures remain negative.  Essential hypertension Continue home meds, IV as needed  MDS (myelodysplastic syndrome), high grade ([place]) Patient follows up with Dr Annamaria Boots as outpatient.  Her overall prognosis is poor due to her MDS.  Holding chemotherapy until follow up as outpatient.  Plan for PRBC transfusion for hgb less than 7.5 or platelets less than 15,000.   Hypokalemia As needed repletion  Malnutrition of moderate degree Continue with nutritional supplements.   GERD (gastroesophageal reflux disease) Continue with antiacid therapy.   DVT prophylaxis: SCDs Start: 12/02/21 1022 Code Status: Full Family Communication: Called her daughter, no answer  Status is: Inpatient Remains inpatient appropriate because: Blood glucose still remain very labile, maintain hospital stay to ensure they are in acceptable range prior to her discharge especially because she is on multiple home medications.  Hopefully home in next 24-48 hours  Nutritional status    Signs/Symptoms: energy intake < or equal to 75% for > or equal to 1 month, mild fat depletion, mild muscle depletion  Interventions: Ensure Enlive (each supplement provides 350kcal and 20 grams of protein), Magic cup, Boost Breeze, MVI  Body mass index is 20.37 kg/m.         Subjective:  Yesterday patient's blood glucose range from 73-500.  Most of the time she remains asymptomatic.  No complaints this  morning.  Examination:  Constitutional: Not in acute distress Respiratory: Clear to auscultation bilaterally Cardiovascular: Normal sinus rhythm, no rubs Abdomen: Nontender nondistended good bowel sounds Musculoskeletal: No edema noted Skin: No rashes seen Neurologic: CN 2-12 grossly intact.  And nonfocal Psychiatric: Normal judgment and insight. Alert and oriented x 3. Normal mood.  Objective: Vitals:   12/21/21 0500 12/21/21 0501 12/21/21 1139 12/21/21 1142  BP:  (!) 110/55 (!) 108/48 (!) 111/48  Pulse:  61 75 74  Resp:  _0 Temp:  98.2 F (36.8 C) 98.7 F (37.1 C) 98.7 F (37.1 C)  TempSrc:  Oral Oral Oral  SpO2:  100% 100% 100%  Weight: 47.3 kg     Height:        Intake/Output Summary (Last 24 hours) at 12/21/2021 1344 Last data filed at 12/21/2021 0900 Gross per 24 hour  Intake 240 ml  Output --  Net 240 ml   Filed Weights   12/18/21 0500 12/20/21 0500 12/21/21 0500  Weight: 57.6 kg 48.8 kg 47.3 kg     Data Reviewed:   CBC: Recent Labs  Lab 12/15/21 0311 12/16/21 0323 12/18/21 0341 12/19/21 0304 12/20/21 0249  WBC 2.8* 3.9* 2.9* 3.8* 3.6*  NEUTROABS 0.5* 0.9*  --   --   --   HGB 7.8* 8.0* 7.1* 9.1* 8.5*  HCT 22.9* 23.7* 20.7* 27.2* 24.8*  MCV 89.1 89.1 89.6 89.2 88.6  PLT 9* 39* 28* 33* 35*   Basic Metabolic Panel: Recent Labs  Lab 12/16/21 0323 12/17/21 0307 12/18/21 0341 12/19/21 0304 12/20/21 0249  NA 137 139 132* 145 136  K 3.5 3.4* 4.9 4.1 3.7  CL 105 105 101 107 102  CO2 _1 GLUCOSE 67* 58* 294* 236* 116*  BUN _2 24* 28*  CREATININE 0.35* 0.31* 0.51 0.48 0.42*  CALCIUM 8.7* 8.6* 8.4* 9.1 8.7*   GFR: Estimated Creatinine Clearance: 49.7 mL/min (A) (by C-G formula based on SCr of 0.42 mg/dL (L)). Liver Function Tests: No results for input(s): "AST", "ALT", "ALKPHOS", "BILITOT", "PROT", "ALBUMIN" in the last 168 hours. No results for input(s): "LIPASE", "AMYLASE" in the last 168 hours. No results for  input(s): "AMMONIA" in the last 168 hours. Coagulation Profile: No results for input(s): "INR", "PROTIME" in the last 168 hours. Cardiac Enzymes: No results for input(s): "CKTOTAL", "CKMB", "CKMBINDEX", "TROPONINI" in the last 168 hours. BNP (last 3 results) No results for input(s): "PROBNP" in the last 8760 hours. HbA1C: No results for input(s): "HGBA1C" in the last 72 hours. CBG: Recent Labs  Lab 12/20/21 1614 12/20/21 1720 12/20/21 2152 12/21/21 0729 12/21/21 1050  GLUCAP 352* 469* 268* 75 133*   Lipid Profile: No results for input(s): "CHOL", "HDL", "LDLCALC", "TRIG", "CHOLHDL", "LDLDIRECT" in the last 72 hours. Thyroid Function Tests: No results for input(s): "TSH", "T4TOTAL", "FREET4", "T3FREE", "THYROIDAB" in the last 72 hours. Anemia Panel: No results for input(s): "VITAMINB12", "FOLATE", "FERRITIN", "TIBC", "IRON", "RETICCTPCT" in the last 72 hours. Sepsis Labs: No results for input(s): "PROCALCITON", "LATICACIDVEN" in the last 168 hours.  No results found for this or any previous visit (from the past 240 hour(s)).       Radiology Studies: No results found.      Scheduled Meds:  sodium chloride   Intravenous Once   amLODipine  5 mg Oral Daily   atorvastatin  40 mg Oral Daily   Chlorhexidine Gluconate Cloth  6 each Topical Daily   docusate sodium  100 mg Oral BID   feeding supplement (GLUCERNA SHAKE)  237 mL Oral TID BM   insulin aspart  0-15 Units Subcutaneous TID WC   insulin aspart  0-5 Units Subcutaneous QHS   insulin aspart  10 Units Subcutaneous TID WC   insulin glargine-yfgn  20 Units Subcutaneous Daily   And   insulin glargine-yfgn  10 Units Subcutaneous QHS   lidocaine  1 patch Transdermal Q24H   losartan  50 mg Oral Daily   multivitamin with minerals  1 tablet Oral Daily   pantoprazole  40 mg Oral BID   polyethylene glycol  17 g Oral Daily   predniSONE  50 mg Oral Q breakfast   Followed by   Derrill Memo ON 12/26/2021] predniSONE  40 mg Oral Q  breakfast   Followed by   Derrill Memo ON 01/02/2022] predniSONE  30 mg Oral Q breakfast   Followed by   Derrill Memo ON 01/09/2022] predniSONE  20 mg Oral Q breakfast   Followed by   Derrill Memo ON 01/16/2022] predniSONE  10 mg Oral Q breakfast   sodium chloride flush  10-40 mL Intracatheter Q12H   sulfamethoxazole-trimethoprim  1 tablet Oral Once per day on Mon Wed Fri   Thrombi-Pad  1 each Topical Once   Continuous Infusions:  sodium chloride Stopped (12/07/21 1409)     LOS: 19 days   Time spent= 35 mins    Torey Reinard Arsenio Loader, MD [facility]  If 7PM-7AM, please contact night-coverage  12/21/2021, 1:44 PM

## 2021-12-21 NOTE — Progress Notes (Addendum)
Mobility Specialist - Progress Note   12/21/21 1100  Mobility  Activity Ambulated with assistance in hallway  Level of Assistance Standby assist, set-up cues, supervision of patient - no hands on  Assistive Device Front wheel walker  Distance Ambulated (ft) 300 ft  Range of Motion/Exercises Active  Activity Response Tolerated well  Mobility Referral Yes  $Mobility charge 1 Mobility   Pt was found in bed and agreeable to ambulate. After a couple steps stated that her legs hurt from not being up and walking as much as she is used to. At EOS returned to sit EOB with necessities in reach.  Ferd Hibbs Mobility Specialist

## 2021-12-21 NOTE — Progress Notes (Addendum)
Occupational Therapy Treatment and Discharge Patient Details Name: Amanda Duarte MRN: [id number] DOB: [birth year] Today's Date: 12/21/2021   History of present illness Pt is a 66 year old woman admitted on 11/9 with fever, weakness, hyperglycemia and AMS. 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 Pt with increased hypoxia on 11/13, intubated 11/13- 11/16 with multilobar PNA. PMH: breast cancer s/p L mastectomy/chemo/radiation, HTN, HLD, anemia, DM2.   OT comments  Patient has made gradual functional progress as a result of receiving OT services during her hospital stay. Today, she performed all implemented functional tasks with supervision or better, including functional transfers, dressing, and ambulation; she has subsequently met her OT goals. OT will sign off and recommend she return home at discharge.    Recommendations for follow up therapy are one component of a multi-disciplinary discharge planning process, led by the attending physician.  Recommendations may be updated based on patient status, additional functional criteria and insurance authorization.    Follow Up Recommendations  No OT follow up     Assistance Recommended at Discharge PRN  Patient can return home with the following  Assistance with cooking/housework;Assist for transportation   Equipment Recommendations  Tub/shower seat       Precautions / Restrictions Precautions Precaution Comments: The pt's primary language is Spanish, however she understands some basic English Restrictions Weight Bearing Restrictions: No       Mobility Bed Mobility      General bed mobility comments: Pt was received seated in the bedside chair    Transfers Overall transfer level: Needs assistance Equipment used: Rolling walker (2 wheels) Transfers: Sit to/from Stand Sit to Stand: Independent                     ADL either performed or assessed with clinical judgement   ADL   Eating/Feeding: Independent;Sitting    Grooming: Set up;Standing           Upper Body Dressing : Set up;Sitting   Lower Body Dressing: Set up;Sit to/from stand   Toilet Transfer: Set up;Ambulation;Rolling walker (2 wheels)   Toileting- Clothing Manipulation and Hygiene: Supervision/safety;Sit to/from stand               Cognition Arousal/Alertness: Awake/alert Behavior During Therapy: WFL for tasks assessed/performed Overall Cognitive Status: Within Functional Limits for tasks assessed                          Pertinent Vitals/ Pain       Pain Assessment Pain Assessment: No/denies pain         Frequency  Other (comment) (no further OT needs identified)        Progress Toward Goals  OT Goals(current goals can now be found in the care plan section)  Progress towards OT goals: Goals met/education completed, patient discharged from OT  Acute Rehab OT Goals OT Goal Formulation: With patient  Plan Discharge plan needs to be updated       AM-PAC OT "6 Clicks" Daily Activity     Outcome Measure   Help from another person eating meals?: None Help from another person taking care of personal grooming?: None Help from another person toileting, which includes using toliet, bedpan, or urinal?: None Help from another person bathing (including washing, rinsing, drying)?: A Little Help from another person to put on and taking off regular upper body clothing?: None Help from another person to put on and taking off regular lower body clothing?: None  6 Click Score: 23    End of Session Equipment Utilized During Treatment: Rolling walker (2 wheels)  OT Visit Diagnosis: Muscle weakness (generalized) (M62.81)   Activity Tolerance Patient tolerated treatment well   Patient Left in chair;with call bell/phone within reach   Nurse Communication Mobility status        Time: 6067-7034 OT Time Calculation (min): 14 min  Charges: OT General Charges $OT Visit: 1 Visit OT Treatments $Therapeutic  Activity: 8-22 mins     Leota Sauers, OTR/L 12/21/2021, 4:04 PM

## 2021-12-21 NOTE — Care Management Important Message (Signed)
Important Message  Patient Details IM Letter given. Name: [name] MRN: [id number] Date of Birth: 1955-12-11   Medicare Important Message Given:  Yes     Kerin Salen 12/21/2021, 2:18 PM

## 2021-12-21 NOTE — Progress Notes (Signed)
RN received report on patient from off-going RN. Will resume patient's care until 12/21/21 at 0700. RN agrees with patient's most recent assessment. Will continue to monitor patient for any changes for the rest of the shift.
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
## 2021-12-22 ENCOUNTER — Ambulatory Visit: Payer: Self-pay

## 2021-12-22 DIAGNOSIS — R6520 Severe sepsis without septic shock: Secondary | ICD-10-CM | POA: Diagnosis not present

## 2021-12-22 DIAGNOSIS — D696 Thrombocytopenia, unspecified: Secondary | ICD-10-CM | POA: Diagnosis not present

## 2021-12-22 DIAGNOSIS — A419 Sepsis, unspecified organism: Secondary | ICD-10-CM | POA: Diagnosis not present

## 2021-12-22 LAB — CBC
HCT: 25.5 % — ABNORMAL LOW (ref 36.0–46.0)
HCT: 27.3 % — ABNORMAL LOW (ref 36.0–46.0)
Hemoglobin: 8.8 g/dL — ABNORMAL LOW (ref 12.0–15.0)
Hemoglobin: 8.9 g/dL — ABNORMAL LOW (ref 12.0–15.0)
MCH: 30 pg (ref 26.0–34.0)
MCH: 30.3 pg (ref 26.0–34.0)
MCHC: 34.5 g/dL (ref 30.0–36.0)
MCHC: 32.6 g/dL (ref 30.0–36.0)
MCV: 87 fL (ref 80.0–100.0)
MCV: 92.9 fL (ref 80.0–100.0)
Platelets: 38 10*3/uL — ABNORMAL LOW (ref 150–400)
Platelets: UNDETERMINED 10*3/uL (ref 150–400)
RBC: 2.93 MIL/uL — ABNORMAL LOW (ref 3.87–5.11)
RBC: 2.94 MIL/uL — ABNORMAL LOW (ref 3.87–5.11)
RDW: 14.5 % (ref 11.5–15.5)
RDW: 14.4 % (ref 11.5–15.5)
WBC: 3.1 10*3/uL — ABNORMAL LOW (ref 4.0–10.5)
WBC: 2.9 10*3/uL — ABNORMAL LOW (ref 4.0–10.5)
nRBC: 0.6 % — ABNORMAL HIGH (ref 0.0–0.2)
nRBC: 0.7 % — ABNORMAL HIGH (ref 0.0–0.2)

## 2021-12-22 LAB — BASIC METABOLIC PANEL
Anion gap: 9 (ref 5–15)
BUN: 26 mg/dL — ABNORMAL HIGH (ref 8–23)
CO2: 24 mmol/L (ref 22–32)
Calcium: 8.6 mg/dL — ABNORMAL LOW (ref 8.9–10.3)
Chloride: 101 mmol/L (ref 98–111)
Creatinine, Ser: 0.5 mg/dL (ref 0.44–1.00)
GFR, Estimated: >60 mL/min (ref >60)
Glucose, Bld: 107 mg/dL — ABNORMAL HIGH (ref 70–99)
Potassium: 3.6 mmol/L (ref 3.5–5.1)
Sodium: 134 mmol/L — ABNORMAL LOW (ref 135–145)

## 2021-12-22 LAB — GLUCOSE, CAPILLARY
Glucose-Capillary: 100 mg/dL — ABNORMAL HIGH (ref 70–99)
Glucose-Capillary: 98 mg/dL (ref 70–99)

## 2021-12-22 LAB — MAGNESIUM: Magnesium: 1.8 mg/dL (ref 1.7–2.4)

## 2021-12-22 MED ORDER — INSULIN PEN NEEDLE 31G X 5 MM MISC: 1.0000 | Freq: Four times a day (QID) | 0 refills | Status: DC

## 2021-12-22 MED ORDER — INSULIN GLARGINE 100 UNIT/ML SOLOSTAR PEN: PEN_INJECTOR | SUBCUTANEOUS | 1 refills | Status: DC

## 2021-12-22 MED ORDER — AMLODIPINE BESYLATE 5 MG PO TABS: 5.0000 mg | ORAL_TABLET | Freq: Every day | ORAL | 0 refills | Status: DC

## 2021-12-22 MED ORDER — PREDNISONE 10 MG PO TABS: ORAL_TABLET | ORAL | 0 refills | Status: AC

## 2021-12-22 MED ORDER — INSULIN LISPRO 100 UNIT/ML IJ SOLN: 10.0000 [IU] | Freq: Three times a day (TID) | INTRAMUSCULAR | 11 refills | Status: DC

## 2021-12-22 MED ORDER — SULFAMETHOXAZOLE-TRIMETHOPRIM 800-160 MG PO TABS: 1.0000 | ORAL_TABLET | ORAL | 0 refills | Status: AC

## 2021-12-22 NOTE — Plan of Care (Signed)
  Problem: Education: Goal: Knowledge of General Education information will improve Description: Including pain rating scale, medication(s)/side effects and non-pharmacologic comfort measures Outcome: Progressing   Problem: Activity: Goal: Risk for activity intolerance will decrease Outcome: Progressing   Problem: Coping: Goal: Level of anxiety will decrease Outcome: Progressing   Problem: Elimination: Goal: Will not experience complications related to urinary retention Outcome: Progressing   Problem: Pain Managment: Goal: General experience of comfort will improve Outcome: Progressing   Problem: Safety: Goal: Ability to remain free from injury will improve Outcome: Progressing   Problem: Skin Integrity: Goal: Risk for impaired skin integrity will decrease Outcome: Progressing   Problem: Activity: Goal: Ability to tolerate increased activity will improve Outcome: Completed/Met   Problem: Respiratory: Goal: Ability to maintain a clear airway and adequate ventilation will improve Outcome: Completed/Met   Problem: Role Relationship: Goal: Method of communication will improve Outcome: Completed/Met   Problem: Safety: Goal: Non-violent Restraint(s) Outcome: Completed/Met

## 2021-12-22 NOTE — Discharge Summary (Signed)
Physician Discharge Summary   Patient: Amanda Duarte MRN: [id number] DOB: 1955/12/30  Admit date:     12/01/2021  Discharge date: 12/22/21  Discharge Physician: Marylu Lund   PCP: [name], NP   Recommendations at discharge:    Follow up with PCP in 1-2 weeks Follow up with Oncology as scheduled  Discharge Diagnoses: Active Problems:   Acute respiratory distress syndrome (ARDS) (HCC)   Lobar pneumonia, unspecified organism (HCC)   Neutropenic fever (HCC)   Essential hypertension   MDS (myelodysplastic syndrome), high grade (HCC)   Hypokalemia   Type 2 diabetes mellitus with hyperlipidemia (HCC)   Malnutrition of moderate degree   GERD (gastroesophageal reflux disease)  Resolved Problems:   * No resolved hospital problems. [facility] Course: Amanda Duarte was admitted to the hospital with the working diagnosis of left lower lobe pneumonia, complicated with acute hypoxemic respiratory failure.   77 yof w/ left breast cancer (Dx 04/2017 S/P left mastectomy and adjuvant radiation), HTN HLD T2DM and recent diagnosis of high-grade myeloid Neoplasm/myelodysplastic syndrome (Bone marrow biopsy 10/2 with confirmatory biopsy 10/11 revealing 7% blasts) thought to be caused by history of chemotherapy complicated by pancytopenia   Despite aggressive treatment with broad-spectrum intravenous antibiotics in the days that followed patient clinically worsened with recurrent fevers and worsening shortness of breath with progressively worsening acute hypoxic respiratory failure eventually prompting PCCM involvement and broadening of antibiotic coverage under the direction of infectious disease.    Hospital course was complicated by persisting pancytopenia requiring intermittent transfusions of blood products including platelets (1 pack on 11/4, 1 pack 11/16 and 1 pack 11/17),  and packed red blood cells (11/9).   Dr. Annamaria Boots with oncology has been following.  Current targets are transfusions for  platelet count of less than 15 and hemoglobin of less than 7.   Unfortunately due to progressive respiratory failure patient eventually required intubation on 11/13 for progressive respiratory failure and transfer to the intensive care unit.  A bronchoscopy was performed at that time with BAL performed for cultures.  Repeat bronchoscopy was performed on 11/15 revealing evidence pulmonary hemorrhage, thought to be secondary to recurrent severe thrombocytopenia.  Hospital course was further complicated by the development of septic shock requiring intermittent vasopressors.   Patient was managed with Neo-Synephrine which has since been weaned off.  Patient also developed episodes of paroxysmal atrial fibrillation.  Amiodarone was initially given but later this was discontinued on 11/14 after having episodes of polymorphic ventricular tachycardia and prolonged QTc.    Patient has since been weaned off of vasopressors.  Patient has been extubated on 11/16.  Patient is currently weaned to 5 L of oxygen via nasal cannula.  11/18: Transferred back to [facility] service.  Worked with PT 11/17 advised home health PT with RW 11/18> left IJ removed and RUE PICC line placed 11/20: ID discontinued Vancomycin ( MRSA PCR is negative, BAL cx and resp cx negative, blood cx negative) 12/15/21:Meropenem/micafungin stopped, plan to monitor off antibiotics.    Assessment and Plan: Type 2 diabetes mellitus with hyperlipidemia ([place]) Has been very labile during the hospitalization while on steroids, per below. BS ranging from 70-400+ - daytime Levemir increased to 20 units at bedtime reduce it to 10 units.  -meal coverage increased to 10 units TID -Lipitor 40 mg daily   Lobar pneumonia, unspecified organism ([place]) Acute respiratory distress/failure Initially due to worsening respiratory status patient was intubated on 11/13 and later extubated on 11/16.  Underwent bronchoscopy.  All  the cultures remain negative.  After  multiple days of broad-spectrum IV antibiotics and micafungin antibiotics were changed to Bactrim.   Bronchodilators as needed, I-S/flutter valve PICC line removed 11/27 ID has recommended a slow taper of prednisone. Due to concerns of PJP ID recommended continuing Bactrim prophylaxis while on steroids.   Neutropenic fever (HCC) Hemoglobin, WBC and platelets are now stable.  This is secondary to MDS.  Cultures remain negative.   Essential hypertension BP had remained stable   MDS (myelodysplastic syndrome), high grade [facility]) Patient follows up with Dr Annamaria Boots as outpatient.  Her overall prognosis is poor due to her MDS.  Holding chemotherapy until follow up as outpatient.    Hypokalemia Remained stable   Malnutrition of moderate degree Continue with nutritional supplements.    GERD (gastroesophageal reflux disease) Continue with antiacid therapy.         Consultants: PCCM, ID Procedures performed:   Disposition: Home Diet recommendation:  Carb modified diet DISCHARGE MEDICATION: Allergies as of 12/22/2021       Reactions   Victoza [liraglutide] Nausea And Vomiting        Medication List     STOP taking these medications    glimepiride 4 MG tablet Commonly known as: AMARYL   Janumet 50-1000 MG tablet Generic drug: sitaGLIPtin-metformin       TAKE these medications    Accu-Chek Guide test strip Generic drug: glucose blood Use to check blood sugar 3 times daily. E11.65   Accu-Chek Guide w/Device Kit use kit to check blood glucose three times daily   Accu-Chek Softclix Lancets lancets Use to check blood sugar 3 times daily. E11.65   amLODipine 5 MG tablet Commonly known as: NORVASC Take 1 tablet (5 mg total) by mouth daily. Start taking on: December 23, 2021   atorvastatin 40 MG tablet Commonly known as: LIPITOR Tome 1 tableta (40 mg en total) por va oral diariamente. (Take 1 tablet (40 mg total) by mouth daily.)   B-D UF III MINI PEN NEEDLES  31G X 5 MM Misc Generic drug: Insulin Pen Needle USE AS INSTRUCTED. INJECT INTO THE SKIN ONCE NIGHTLY.   Blood Pressure Monitor Devi Please provide patient with insurance approved blood pressure monitor   citalopram 20 MG tablet Commonly known as: CELEXA Take 1 tablet (20 mg total) by mouth daily. For depression   fenofibrate 145 MG tablet Commonly known as: TRICOR Take 1 tablet (145 mg total) by mouth daily.   hydrOXYzine 10 MG tablet Commonly known as: ATARAX Take 1 tablet (10 mg total) by mouth 3 (three) times daily as needed for anxiety.   insulin glargine 100 UNIT/ML Solostar Pen Commonly known as: LANTUS Inject 20 Units into the skin every morning AND 10 Units at bedtime. What changed: See the new instructions.   insulin lispro 100 UNIT/ML injection Commonly known as: HumaLOG Inject 0.1 mLs (10 Units total) into the skin 3 (three) times daily before meals. What changed: how much to take   Insulin Syringe-Needle U-100 30G X 5/16" 1 ML Misc Use as directed to inject into the skin 3 times daily.   losartan 50 MG tablet Commonly known as: COZAAR Tome 1 tableta (50 mg en total) por va oral diariamente. (Take 1 tablet (50 mg total) by mouth daily.)   ondansetron 8 MG tablet Commonly known as: Zofran Take 1 tablet (8 mg total) by mouth every 8 (eight) hours as needed for nausea or vomiting. What changed: when to take this   pantoprazole 40 MG  tablet Commonly known as: PROTONIX Take 1 tablet (40 mg total) by mouth daily at 6 (six) AM.   potassium chloride SA 20 MEQ tablet Commonly known as: KLOR-CON M Tome una tableta (20 mEq en total) por va oral diariamente. (Take 1 tablet (20 mEq total) by mouth daily.)   predniSONE 10 MG tablet Commonly known as: DELTASONE Take 5 tablets (50 mg total) by mouth daily with breakfast for 4 days, THEN 4 tablets (40 mg total) daily with breakfast for 7 days, THEN 3 tablets (30 mg total) daily with breakfast for 7 days, THEN 2  tablets (20 mg total) daily with breakfast for 7 days, THEN 1 tablet (10 mg total) daily with breakfast for 7 days. Start taking on: December 22, 2021   prochlorperazine 10 MG tablet Commonly known as: COMPAZINE Take 1 tablet (10 mg total) by mouth every 6 (six) hours as needed for nausea or vomiting.   sulfamethoxazole-trimethoprim 800-160 MG tablet Commonly known as: BACTRIM DS Take 1 tablet by mouth 3 (three) times a week. Start taking on: December 24, 2021               Durable Medical Equipment  (From admission, onward)           Start     Ordered   12/13/21 1212  For home use only DME Tub bench  Once        12/13/21 1211   12/13/21 1117  For home use only DME Walker rolling  Once       Comments: Youth rolling walker  Question Answer Comment  Walker: With 5 Inch Wheels   Patient needs a walker to treat with the following condition Difficulty in walking, not elsewhere classified      12/13/21 1116            Follow-up Information     [facility] [place] Follow up.   Specialty: [place] Why: [facility] physical therapy/occupational therapy Contact information: [place] [address] [phone]         [facility] Follow up.   Why: Youth rw,;tub bench Contact information: [address] [place] [address] [phone]         [name], MD Follow up.   Specialties: Hematology, Oncology Why: Hospital follow up, as scheduled Contact information: [place] [address] [phone]         [name], NP Follow up in 2 week(s).   Specialty: Nurse Practitioner Why: Hospital follow up Contact information: [place] [address] [phone]                Discharge Exam: [place] Weights   12/20/21 0500 12/21/21 0500 12/22/21 0500  Weight: 48.8 kg 47.3 kg 46.2 kg   General exam: Awake, laying in bed, in nad Respiratory system:  Normal respiratory effort, no wheezing Cardiovascular system: regular rate, s1, s2 Gastrointestinal system: Soft, nondistended, positive BS Central nervous system: CN2-12 grossly intact, strength intact Extremities: Perfused, no clubbing Skin: Normal skin turgor, no notable skin lesions seen Psychiatry: Mood normal // no visual hallucinations   Condition at discharge: fair  The results of significant diagnostics from this hospitalization (including imaging, microbiology, ancillary and laboratory) are listed below for reference.   Imaging Studies: [name] EKG SITE RITE  Result Date: 12/11/2021 If Site Rite image not attached, placement could not be confirmed due to current cardiac rhythm.  DG CHEST PORT 1  VIEW  Result Date: 12/08/2021 CLINICAL DATA:  [id number] with acute respiratory failure with hypoxia and ventilator dependence. EXAM: PORTABLE CHEST 1 VIEW COMPARISON:  Portable 12/06/2021 FINDINGS: 4:41 a.m. ETT interval pullback to 2.6 cm from the carina. The left IJ central line again terminates in the distal SVC. NGT is partially visible coiled in the stomach. Neither the side-hole or the tip of the tube are filmed. The cardiac size is normal. Patchy dense opacities of the left mid and both lower lung fields appear similar, denser on the left as before and with small underlying left pleural effusion. The right mid and both apical lung regions remain generally clear. Overall aeration seems unchanged. The mediastinal configuration is normal. There are left axillary surgical clips, multiple overlying monitor wires. No acute osseous findings. IMPRESSION: 1. ETT interval pullback to 2.6 cm from the carina. 2. NGT coiled in the stomach. 3. Left IJ central line again terminates in the distal SVC. 4. No interval change in the appearance of the lungs. Electronically Signed   By: Telford Nab M.D.   On: 12/08/2021 07:32   ECHOCARDIOGRAM COMPLETE  Result Date: 12/07/2021    ECHOCARDIOGRAM REPORT    Patient Name:   Amanda Duarte Date of Exam: 12/07/2021 Medical Rec #:  [id number]   Height:       60.0 in Accession #:    [id number]  Weight:       119.5 lb Date of Birth:  1955-08-10    BSA:          1.500 m Patient Age:    37 years    BP:           108/43 mmHg Patient Gender: F           HR:           86 bpm. Exam Location:  Inpatient Procedure: 2D Echo Indications:    Fever  History:        Patient has prior history of Echocardiogram examinations, most                 recent 06/01/2017. Risk Factors:Hypertension and Diabetes.  Sonographer:    Harvie Junior Referring Phys: [phone] [place]  1. Left ventricular ejection fraction, by estimation, is 45 to 50%. The left ventricle has mildly decreased function. The left ventricle has no regional wall motion abnormalities. Left ventricular diastolic parameters are indeterminate.  2. Right ventricular systolic function is normal. The right ventricular size is normal. There is normal pulmonary artery systolic pressure.  3. The mitral valve is grossly normal. No evidence of mitral valve regurgitation. No evidence of mitral stenosis.  4. The aortic valve is grossly normal. Aortic valve regurgitation is not visualized. No aortic stenosis is present.  5. The inferior vena cava is normal in size with <50% respiratory variability, suggesting right atrial pressure of 8 mmHg. Comparison(s): Changes from prior study are noted. EF mildly reduced on this study compared to prior, no focal wall motion abnormalities (globally mildly reduced). FINDINGS  Left Ventricle: Left ventricular ejection fraction, by estimation, is 45 to 50%. The left ventricle has mildly decreased function. The left ventricle has no regional wall motion abnormalities. The left ventricular internal cavity size was normal in size. There is no left ventricular hypertrophy. Left ventricular diastolic parameters are indeterminate. Right Ventricle: The right ventricular size is normal. No increase in right  ventricular wall thickness. Right ventricular systolic function is normal. There is normal pulmonary artery systolic pressure. The  tricuspid regurgitant velocity is 2.63 m/s, and  with an assumed right atrial pressure of 8 mmHg, the estimated right ventricular systolic pressure is 19.3 mmHg. Left Atrium: Left atrial size was normal in size. Right Atrium: Right atrial size was normal in size. Pericardium: There is no evidence of pericardial effusion. Mitral Valve: The mitral valve is grossly normal. No evidence of mitral valve regurgitation. No evidence of mitral valve stenosis. Tricuspid Valve: The tricuspid valve is normal in structure. Tricuspid valve regurgitation is mild . No evidence of tricuspid stenosis. Aortic Valve: The aortic valve is grossly normal. Aortic valve regurgitation is not visualized. No aortic stenosis is present. Aortic valve mean gradient measures 5.0 mmHg. Aortic valve peak gradient measures 8.5 mmHg. Aortic valve area, by VTI measures 1.40 cm. Pulmonic Valve: The pulmonic valve was not well visualized. Pulmonic valve regurgitation is not visualized. No evidence of pulmonic stenosis. Aorta: The aortic root and ascending aorta are structurally normal, with no evidence of dilitation. Venous: The inferior vena cava is normal in size with less than 50% respiratory variability, suggesting right atrial pressure of 8 mmHg. IAS/Shunts: The atrial septum is grossly normal.  LEFT VENTRICLE PLAX 2D LVIDd:         4.10 cm     Diastology LVIDs:         3.20 cm     LV e' medial:    7.62 cm/s LV PW:         0.80 cm     LV E/e' medial:  9.8 LV IVS:        0.75 cm     LV e' lateral:   6.20 cm/s LVOT diam:     1.70 cm     LV E/e' lateral: 12.0 LV SV:         37 LV SV Index:   25 LVOT Area:     2.27 cm  LV Volumes (MOD) LV vol d, MOD A2C: 77.7 ml LV vol d, MOD A4C: 54.1 ml LV vol s, MOD A2C: 44.1 ml LV vol s, MOD A4C: 32.4 ml LV SV MOD A2C:     33.6 ml LV SV MOD A4C:     54.1 ml LV SV MOD BP:      27.4 ml  RIGHT VENTRICLE RV Basal diam:  2.80 cm RV Mid diam:    2.20 cm TAPSE (M-mode): 1.6 cm LEFT ATRIUM             Index        RIGHT ATRIUM          Index LA diam:        2.90 cm 1.93 cm/m   RA Area:     7.72 cm LA Vol (A2C):   27.2 ml 18.14 ml/m  RA Volume:   14.30 ml 9.54 ml/m LA Vol (A4C):   22.9 ml 15.27 ml/m LA Biplane Vol: 25.4 ml 16.94 ml/m  AORTIC VALVE                     PULMONIC VALVE AV Area (Vmax):    1.43 cm      PV Vmax:       0.91 m/s AV Area (Vmean):   1.43 cm      PV Peak grad:  3.3 mmHg AV Area (VTI):     1.40 cm AV Vmax:           146.00 cm/s AV Vmean:  108.000 cm/s AV VTI:            0.264 m AV Peak Grad:      8.5 mmHg AV Mean Grad:      5.0 mmHg LVOT Vmax:         92.00 cm/s LVOT Vmean:        68.100 cm/s LVOT VTI:          0.163 m LVOT/AV VTI ratio: 0.62  AORTA Ao Root diam: 2.90 cm MITRAL VALVE               TRICUSPID VALVE MV Area (PHT): 3.53 cm    TR Peak grad:   27.7 mmHg MV Decel Time: 215 msec    TR Vmax:        263.00 cm/s MV E velocity: 74.60 cm/s MV A velocity: 81.80 cm/s  SHUNTS MV E/A ratio:  0.91        Systemic VTI:  0.16 m                            Systemic Diam: 1.70 cm Buford Dresser MD Electronically signed by Buford Dresser MD Signature Date/Time: 12/07/2021/4:20:28 PM    Final    DG CHEST PORT 1 VIEW  Result Date: 12/06/2021 CLINICAL DATA:  Status post central line placement EXAM: PORTABLE CHEST 1 VIEW COMPARISON:  Chest x-ray dated December 06, 2021 obtained at 6:18 p.m. FINDINGS: Slight interval retraction of ET tube which is approximately 1.0 cm from the carina. Interval placement of left IJ line with tip overlying the expected area of the mid SVC. Enteric tube partially seen coursing below the diaphragm. Right-greater-than-left heterogeneous opacities, unchanged. No large pleural effusion or evidence of pneumothorax. IMPRESSION: 1. Interval placement of left IJ line with tip overlying the expected area of the mid SVC. 2. Slight interval  retraction of ET tube which is approximately 1.0 cm from the carina. Consider further retraction for optimal positioning. Electronically Signed   By: Yetta Glassman M.D.   On: 12/06/2021 19:44   DG Abd 1 View  Result Date: 12/06/2021 CLINICAL DATA:  Orogastric tube placement EXAM: ABDOMEN - 1 VIEW COMPARISON:  CT 12/05/2021 FINDINGS: Limited radiograph of the lower chest and upper abdomen was obtained for the purposes of enteric tube localization. Enteric tube is seen coursing below the diaphragm with distal tip and side port terminating within the expected location of the distal stomach. Endotracheal tube position in the right mainstem bronchus, as seen on dedicated chest x-ray. IMPRESSION: Enteric tube tip and side port project within the expected location of the distal stomach. Electronically Signed   By: Davina Poke D.O.   On: 12/06/2021 19:00   DG CHEST PORT 1 VIEW  Result Date: 12/06/2021 CLINICAL DATA:  Intubation EXAM: PORTABLE CHEST 1 VIEW COMPARISON:  12/06/2021 at 0845 hours FINDINGS: Interval placement of endotracheal tube extending into the proximal right mainstem bronchus. Interval placement of enteric tube which is coiled within the stomach. Heart size within normal limits. Similar bilateral airspace opacities with progressive opacification of the left lung, likely superimposed atelectasis. No pneumothorax. IMPRESSION: 1. Interval placement of endotracheal tube extending into the proximal right mainstem bronchus. Recommend retraction approximately 4 cm. 2. Increasing left lung opacification, likely atelectasis. 3. Interval placement of enteric tube which is coiled within the stomach. These results will be called to the ordering clinician or representative by the Radiologist Assistant, and communication documented in the PACS or [employer]. Electronically  Signed   By: Davina Poke D.O.   On: 12/06/2021 18:57   DG CHEST PORT 1 VIEW  Result Date: 12/06/2021 CLINICAL DATA:   Pneumonia EXAM: PORTABLE CHEST 1 VIEW COMPARISON:  Previous studies including the chest radiograph done on 12/02/2021 and CT done on 12/05/2021 FINDINGS: Transverse diameter of heart is increased. Large alveolar infiltrates are seen in left upper, mid and lower lung fields with interval worsening. There are new patchy infiltrates in right lung. Right lateral CP angle is clear. There is no pneumothorax. Surgical clips are seen in left axilla and left chest wall. IMPRESSION: Cardiomegaly. Extensive infiltrates are seen in left lung with interval progression. There are multiple patchy infiltrates in the right lung. Findings suggest interval worsening of bilateral multifocal pneumonia. Electronically Signed   By: Elmer Picker M.D.   On: 12/06/2021 09:46   CT CHEST ABDOMEN PELVIS W CONTRAST  Result Date: 12/05/2021 CLINICAL DATA:  Sepsis.  Persistent fever.  Pancytopenia. EXAM: CT CHEST, ABDOMEN, AND PELVIS WITH CONTRAST TECHNIQUE: Multidetector CT imaging of the chest, abdomen and pelvis was performed following the standard protocol during bolus administration of intravenous contrast. RADIATION DOSE REDUCTION: This exam was performed according to the departmental dose-optimization program which includes automated exposure control, adjustment of the mA and/or kV according to patient size and/or use of iterative reconstruction technique. CONTRAST:  175m OMNIPAQUE IOHEXOL 300 MG/ML  SOLN COMPARISON:  10/21/2021 FINDINGS: CT CHEST FINDINGS Cardiovascular: Heart size appears normal. No pericardial effusion. Aortic atherosclerosis. Mediastinum/Nodes: Thyroid gland, trachea, and esophagus are unremarkable. Previous left axillary node dissection. No axillary, mediastinal, or hilar adenopathy identified. Lungs/Pleura: Severe, multifocal bilateral airspace opacities identified compatible with pneumonia. This is most severe within the left lower lobe where there is near complete consolidation. There is dense airspace  consolidation noted within the posterolateral right lower lobe with overlying areas of ground-glass attenuation. Airspace disease within the inferior lingula is also identified. Patchy area of ground-glass attenuation in the left apex is unchanged from the previous exam. There is a small left pleural effusion which overlies the posterior right upper lobe and superior segment of left lower lobe. There is also a small amount of fluid identified overlying the left lung base. Musculoskeletal: Left mastectomy. No acute or suspicious osseous findings. CT ABDOMEN PELVIS FINDINGS Hepatobiliary: No focal liver abnormality is seen. No gallstones, gallbladder wall thickening, or biliary dilatation. Pancreas: Unremarkable. No pancreatic ductal dilatation or surrounding inflammatory changes. Spleen: Normal in size without focal abnormality. Adrenals/Urinary Tract: Normal adrenal glands. No nephrolithiasis, hydronephrosis or mass. Urinary bladder appears normal. Stomach/Bowel: Stomach is within normal limits. Appendix appears normal. No evidence of bowel wall thickening, distention, or inflammatory changes. Vascular/Lymphatic: Aortic atherosclerosis. No signs of abdominopelvic adenopathy. Reproductive: Uterus and bilateral adnexa are unremarkable. Other: No free fluid or fluid collections. Musculoskeletal: No acute or significant osseous findings. Scattered subcutaneous injection sites identified within the subcutaneous soft tissues of the ventral abdominal wall. IMPRESSION: 1. Severe, multifocal bilateral airspace opacities compatible with pneumonia. This is most severe within the left lower lobe where there is near complete airspace consolidation. 2. Small left pleural effusion. 3. No acute findings identified within the abdomen or pelvis. 4.  Aortic Atherosclerosis (ICD10-I70.0). Electronically Signed   By: TKerby MoorsM.D.   On: 12/05/2021 14:14   DG CHEST PORT 1 VIEW  Result Date: 12/02/2021 CLINICAL DATA:  Hypoxia.  EXAM: PORTABLE CHEST 1 VIEW COMPARISON:  December 02, 2021 (8:25 a.m.) FINDINGS: The heart size and mediastinal contours are within normal limits.  Persistent moderate severity left upper lobe and left lower lobe atelectasis and/or infiltrate is seen. There is a small, stable left pleural effusion. No pneumothorax is identified. Multiple surgical clips are seen along the left axilla. The visualized skeletal structures are unremarkable. IMPRESSION: Persistent moderate severity left upper lobe and left lower lobe atelectasis and/or infiltrate. Electronically Signed   By: Virgina Norfolk M.D.   On: 12/02/2021 20:15   DG Chest Port 1 View  Result Date: 12/02/2021 CLINICAL DATA:  Shortness of breath. EXAM: PORTABLE CHEST 1 VIEW COMPARISON:  Chest x-ray 11 8 2023 FINDINGS: The cardiac silhouette, mediastinal contours are stable. Persistent and slightly progressive left upper and lower lobe pneumonia. Suspect small parapneumonic effusion. The right lung remains clear. IMPRESSION: Persistent and slightly progressive left upper and lower lobe pneumonia. Electronically Signed   By: Marijo Sanes M.D.   On: 12/02/2021 08:44   DG Chest Port 1 View  Result Date: 12/01/2021 CLINICAL DATA:  Possible sepsis low-grade fever EXAM: PORTABLE CHEST 1 VIEW COMPARISON:  CT 10/21/2021 FINDINGS: Airspace disease at the left lung base. No pleural effusion. Mild cardiomegaly. No pneumothorax. Clips in the left axilla IMPRESSION: Airspace disease at the left lung base concerning for pneumonia. Cardiomegaly. Electronically Signed   By: Donavan Foil M.D.   On: 12/01/2021 23:01    Microbiology: Results for orders placed or performed during the hospital encounter of 12/01/21  Blood Culture (routine x 2)     Status: None   Collection Time: 12/01/21 11:13 PM   Specimen: BLOOD  Result Value Ref Range Status   Specimen Description   Final    BLOOD RIGHT ANTECUBITAL Performed at [facility] [address],  [place], [place] [address]    Special Requests   Final    BOTTLES DRAWN AEROBIC AND ANAEROBIC Blood Culture results may not be optimal due to an excessive volume of blood received in culture bottles Performed at [facility] [address], [place], [place] [address]    Culture   Final    NO GROWTH 5 DAYS Performed at [facility], [address], [place], [place] [address]    Report Status 12/07/2021 FINAL  Final  Blood Culture (routine x 2)     Status: None   Collection Time: 12/01/21 11:13 PM   Specimen: BLOOD  Result Value Ref Range Status   Specimen Description   Final    BLOOD SITE NOT SPECIFIED Performed at [facility] [place] [address], [place], [place] [address]    Special Requests   Final    NONE Performed at [facility], [place] [address], [place], [place] [address]    Culture   Final    NO GROWTH 5 DAYS Performed at [facility], [place] [address], [place], [place] [address]    Report Status 12/07/2021 FINAL  Final  Resp Panel by RT-PCR (Flu A&B, Covid) Anterior Nasal Swab     Status: None   Collection Time: 12/01/21 11:25 PM   Specimen: Anterior Nasal Swab  Result Value Ref Range Status   SARS Coronavirus 2 by RT PCR NEGATIVE NEGATIVE Final    Comment: (NOTE) SARS-CoV-2 target nucleic acids are NOT DETECTED.  The SARS-CoV-2 RNA is generally detectable in upper respiratory specimens during the acute phase of infection. The lowest concentration of SARS-CoV-2 viral copies this assay can detect is 138 copies/mL. A negative result does not preclude SARS-Cov-2 infection and should not be used as  the sole basis for treatment or other patient management decisions. A negative result may occur with  improper specimen collection/handling, submission of specimen other than nasopharyngeal swab, presence of viral mutation(s) within the areas targeted by this assay, and inadequate number of viral copies(<138  copies/mL). A negative result must be combined with clinical observations, patient history, and epidemiological information. The expected result is Negative.  Fact Sheet for Patients:  EntrepreneurPulse.com.au  Fact Sheet for Healthcare Providers:  IncredibleEmployment.be  This test is no t yet approved or cleared by the [place] FDA and  has been authorized for detection and/or diagnosis of SARS-CoV-2 by FDA under an Emergency Use Authorization (EUA). This EUA will remain  in effect (meaning this test can be used) for the duration of the COVID-19 declaration under Section 564(b)(1) of the Act, 21 U.S.C.section 360bbb-3(b)(1), unless the authorization is terminated  or revoked sooner.       Influenza A by PCR NEGATIVE NEGATIVE Final   Influenza B by PCR NEGATIVE NEGATIVE Final    Comment: (NOTE) The Xpert Xpress SARS-CoV-2/FLU/RSV plus assay is intended as an aid in the diagnosis of influenza from Nasopharyngeal swab specimens and should not be used as a sole basis for treatment. Nasal washings and aspirates are unacceptable for Xpert Xpress SARS-CoV-2/FLU/RSV testing.  Fact Sheet for Patients: EntrepreneurPulse.com.au  Fact Sheet for Healthcare Providers: IncredibleEmployment.be  This test is not yet approved or cleared by the [place] FDA and has been authorized for detection and/or diagnosis of SARS-CoV-2 by FDA under an Emergency Use Authorization (EUA). This EUA will remain in effect (meaning this test can be used) for the duration of the COVID-19 declaration under Section 564(b)(1) of the Act, 21 U.S.C. section 360bbb-3(b)(1), unless the authorization is terminated or revoked.  Performed at [facility], [place] [address] [place], [place] [address]   Respiratory (~20 pathogens) panel by PCR     Status: None   Collection Time: 12/01/21 11:25 PM   Specimen:  Nasopharyngeal Swab; Respiratory  Result Value Ref Range Status   Adenovirus NOT DETECTED NOT DETECTED Final   Coronavirus 229E NOT DETECTED NOT DETECTED Final    Comment: (NOTE) The Coronavirus on the Respiratory Panel, DOES NOT test for the novel  Coronavirus (2019 nCoV)    Coronavirus HKU1 NOT DETECTED NOT DETECTED Final   Coronavirus NL63 NOT DETECTED NOT DETECTED Final   Coronavirus OC43 NOT DETECTED NOT DETECTED Final   Metapneumovirus NOT DETECTED NOT DETECTED Final   Rhinovirus / Enterovirus NOT DETECTED NOT DETECTED Final   Influenza A NOT DETECTED NOT DETECTED Final   Influenza B NOT DETECTED NOT DETECTED Final   Parainfluenza Virus 1 NOT DETECTED NOT DETECTED Final   Parainfluenza Virus 2 NOT DETECTED NOT DETECTED Final   Parainfluenza Virus 3 NOT DETECTED NOT DETECTED Final   Parainfluenza Virus 4 NOT DETECTED NOT DETECTED Final   Respiratory Syncytial Virus NOT DETECTED NOT DETECTED Final   Bordetella pertussis NOT DETECTED NOT DETECTED Final   Bordetella Parapertussis NOT DETECTED NOT DETECTED Final   Chlamydophila pneumoniae NOT DETECTED NOT DETECTED Final   Mycoplasma pneumoniae NOT DETECTED NOT DETECTED Final    Comment: Performed at [facility], [place]. [address] [place],  [address]  Urine Culture     Status: Abnormal   Collection Time: 12/02/21  3:00 AM   Specimen: In/Out Cath Urine  Result Value Ref Range Status   Specimen Description   Final    IN/OUT CATH URINE Performed at [facility]  [place] [address], [place], [place] [address]    Special Requests   Final    NONE Performed at [facility], [place] [address], [place], [place] [address]    Culture MULTIPLE SPECIES PRESENT, SUGGEST RECOLLECTION (A)  Final   Report Status 12/03/2021 FINAL  Final  MRSA Next Gen by PCR, Nasal     Status: None   Collection Time: 12/02/21  1:04 PM   Specimen: Nasal Mucosa; Nasal Swab  Result Value Ref Range Status   MRSA by PCR  Next Gen NOT DETECTED NOT DETECTED Final    Comment: (NOTE) The GeneXpert MRSA Assay (FDA approved for NASAL specimens only), is one component of a comprehensive MRSA colonization surveillance program. It is not intended to diagnose MRSA infection nor to guide or monitor treatment for MRSA infections. Test performance is not FDA approved in patients less than 75 years old. Performed at [facility], [place] [address], [place], [place] [address]   Culture, blood (Routine X 2) w Reflex to ID Panel     Status: None   Collection Time: 12/05/21 11:45 AM   Specimen: BLOOD  Result Value Ref Range Status   Specimen Description   Final    BLOOD SITE NOT SPECIFIED Performed at [facility] [address] [place], [place] [address]    Special Requests   Final    BOTTLES DRAWN AEROBIC AND ANAEROBIC Blood Culture adequate volume Performed at [place] [address] [place], [place] [address]    Culture   Final    NO GROWTH 5 DAYS Performed at [facility], [place] [address], [place], [place] [address]    Report Status 12/10/2021 FINAL  Final  Culture, blood (Routine X 2) w Reflex to ID Panel     Status: None   Collection Time: 12/05/21 11:45 AM   Specimen: BLOOD  Result Value Ref Range Status   Specimen Description   Final    BLOOD SITE NOT SPECIFIED Performed at [facility] [address] [place], [place] [address]    Special Requests   Final    BOTTLES DRAWN AEROBIC AND ANAEROBIC Blood Culture adequate volume Performed at [facility] [address] [place], [place] [address]    Culture   Final    NO GROWTH 5 DAYS Performed at [facility], [place] [address], [place], [place] [address]    Report Status 12/10/2021 FINAL  Final  Expectorated Sputum Assessment w Gram Stain, Rflx to Resp Cult     Status: None   Collection Time: 12/05/21  6:35 PM   Specimen: Sputum  Result  Value Ref Range Status   Specimen Description   Final    SPU Performed at [place] [address] [place], [place] [address]    Special Requests   Final    NONE Performed at [facility], [place] [address], [place], [place] [address]    Sputum evaluation THIS SPECIMEN IS ACCEPTABLE FOR SPUTUM CULTURE  Final   Report Status 12/05/2021 FINAL  Final  Culture, Respiratory w Gram Stain     Status: None   Collection Time: 12/05/21  6:35 PM   Specimen: Sputum  Result Value Ref Range Status   Specimen Description   Final    SPU Performed at [facility] [address] [place], [place] [address]    Special Requests   Final  NONE Reflexed from [id number] Performed at [facility], [place] [address], [place], [place] [address]    Gram Stain   Final    FEW WBC PRESENT, PREDOMINANTLY MONONUCLEAR FEW SQUAMOUS EPITHELIAL CELLS PRESENT FEW GRAM VARIABLE ROD FEW BUDDING YEAST SEEN    Culture   Final    FEW Normal respiratory flora-no Staph aureus or Pseudomonas seen Performed at [facility], [address], [place], [place] [address]    Report Status 12/08/2021 FINAL  Final  Culture, Respiratory w Gram Stain     Status: None   Collection Time: 12/06/21  6:15 PM   Specimen: Bronchoalveolar Lavage; Respiratory  Result Value Ref Range Status   Specimen Description   Final    BRONCHIAL ALVEOLAR LAVAGE Performed at [facility] [address] [place], [place] [address]    Special Requests   Final    NONE Performed at [facility], [place] [address] [place], [place] [address]    Gram Stain   Final    FEW WBC PRESENT, PREDOMINANTLY MONONUCLEAR NO ORGANISMS SEEN    Culture   Final    NO GROWTH 2 DAYS Performed at [facility] [address] [place], [place] [address]    Report Status 12/09/2021 FINAL  Final  Fungus Culture With Stain     Status: None  (Preliminary result)   Collection Time: 12/06/21  6:15 PM   Specimen: Bronchial Alveolar Lavage  Result Value Ref Range Status   Fungus Stain Final report  Final    Comment: (NOTE) Performed At: [facility] [place], [place] [id number] Rush Farmer MD OI:[id number]    Fungus (Mycology) Culture PENDING  Incomplete   Fungal Source BRONCHIAL ALVEOLAR LAVAGE  Final    Comment: Performed at [facility], [place] [address], [place], [place] [address]  Acid Fast Smear (AFB)     Status: None   Collection Time: 12/06/21  6:15 PM   Specimen: Bronchoalveolar Lavage; Respiratory  Result Value Ref Range Status   AFB Specimen Processing Concentration  Final   Acid Fast Smear Negative  Final    Comment: (NOTE) Performed At: [facility] [place], [place] [id number] Rush Farmer MD LP:[id number]    Source (AFB) BRONCHIAL ALVEOLAR LAVAGE  Final    Comment: Performed at [place] [address] [place], [place] [address]  Fungus Culture Result     Status: None   Collection Time: 12/06/21  6:15 PM  Result Value Ref Range Status   Result 1 Comment  Final    Comment: (NOTE) KOH/Calcofluor preparation:  no fungus observed. Performed At: [facility] [place], [place] [id number] Rush Farmer MD QQ:[id number]   Aspergillus Ag, BAL/Serum     Status: None   Collection Time: 12/07/21  8:36 AM   Specimen: Bronchial Alveolar Lavage; Respiratory  Result Value Ref Range Status   Aspergillus Ag, BAL/Serum 0.07 0.00 - 0.49 Index Final    Comment: (NOTE) Performed At: [facility] [place], [place] [id number] Rush Farmer MD GY:[id number]   Culture, Respiratory w Gram Stain     Status: None   Collection Time: 12/08/21 11:32 AM   Specimen: Bronchoalveolar Lavage; Respiratory  Result Value Ref Range Status   Specimen Description   Final    BRONCHIAL ALVEOLAR  LAVAGE Performed at [place] [address], [place], [place] [address]    Special Requests  Final    NONE Performed at [facility], [place] [address] [place], [place] [address]    Gram Stain NO WBC SEEN NO ORGANISMS SEEN   Final   Culture   Final    NO GROWTH 2 DAYS Performed at [facility], [place] [address], [place], [place] [address]    Report Status 12/10/2021 FINAL  Final  Pneumocystis smear by DFA     Status: None   Collection Time: 12/08/21 11:32 AM   Specimen: Bronchoalveolar Lavage; Respiratory  Result Value Ref Range Status   Specimen Source-PJSRC BRONCHIAL ALVEOLAR LAVAGE  Final   Pneumocystis jiroveci Ag NEGATIVE  Final    Comment: Performed at [place] Performed at [place] [address] [place], [place] [address]     Labs: CBC: Recent Labs  Lab 12/16/21 [phone] 12/18/21 0341 12/19/21 0304 12/20/21 0249 12/21/21 2300 12/22/21 0959  WBC 3.9* 2.9* 3.8* 3.6* 3.1* 2.9*  NEUTROABS 0.9*  --   --   --   --   --   HGB 8.0* 7.1* 9.1* 8.5* 8.8* 8.9*  HCT 23.7* 20.7* 27.2* 24.8* 25.5* 27.3*  MCV 89.1 89.6 89.2 88.6 87.0 92.9  PLT 39* 28* 33* 35* 38* PLATELET CLUMPS NOTED ON SMEAR, UNABLE TO ESTIMATE   Basic Metabolic Panel: Recent Labs  Lab 12/18/21 0341 12/19/21 0304 12/20/21 0249 12/21/21 2300 12/22/21 0804  NA 132* 145 136 134* 134*  K 4.9 4.1 3.7 3.5 3.6  CL 101 107 102 100 101  CO2 _0 GLUCOSE 294* 236* 116* 103* 107*  BUN 21 24* 28* 25* 26*  CREATININE 0.51 0.48 0.42* 0.59 0.50  CALCIUM 8.4* 9.1 8.7* 9.1 8.6*  MG  --   --   --  1.7 1.8   Liver Function Tests: No results for input(s): "AST", "ALT", "ALKPHOS", "BILITOT", "PROT", "ALBUMIN" in the last 168 hours. CBG: Recent Labs  Lab 12/21/21 1050 12/21/21 1636 12/21/21 2128 12/22/21 0753 12/22/21 1153  GLUCAP 133* 275* 244* 100* 98    Discharge time spent: less than 30  minutes.  Signed: Marylu Lund, MD [facility] 12/22/2021

## 2021-12-22 NOTE — Progress Notes (Signed)
Mobility Specialist - Progress Note   12/22/21 0913  Mobility  Activity Ambulated with assistance in hallway  Level of Assistance Standby assist, set-up cues, supervision of patient - no hands on  Assistive Device Front wheel walker  Distance Ambulated (ft) 500 ft  Range of Motion/Exercises Active  Activity Response Tolerated well  Mobility Referral Yes  $Mobility charge 1 Mobility   Pt was found in bed and agreeable to ambulate. Had no complaints during session and at EOS returned to sit EOB with all necessities in reach and NT notified.  Ferd Hibbs Mobility Specialist

## 2021-12-22 NOTE — Progress Notes (Signed)
Mobility Specialist - Progress Note   12/22/21 1448  Mobility  Activity Ambulated with assistance in hallway  Level of Assistance Standby assist, set-up cues, supervision of patient - no hands on  Assistive Device Front wheel walker  Distance Ambulated (ft) 480 ft  Range of Motion/Exercises Active  Activity Response Tolerated well  Mobility Referral Yes  $Mobility charge 1 Mobility   Pt was found on recliner chair and agreeable to ambulate. Stated that she felt her legs getting stronger during ambulation and at EOS returned to recliner chair with necessities in reach.  Ferd Hibbs Mobility Specialist

## 2021-12-22 NOTE — TOC Transition Note (Signed)
Transition of Care [facility]) - CM/SW Discharge Note   Patient Details  Name: Amanda Duarte MRN: [id number] Date of Birth: January 22, 1956  Transition of Care [facility]) CM/SW Contact:  Dessa Phi, RN Phone Number: 12/22/2021, 2:04 PM   Clinical Narrative: Alvis Lemmings HHPT rep Tommi Rumps already following. [employer] rep Erasmo Downer aware of youth rw,tub bench to deliver to rm prior d/c. No further CM needs.      Final next level of care: Home w Home Health Services Barriers to Discharge: No Barriers Identified   Patient Goals and CMS Choice   CMS Medicare.gov Compare Post Acute Care list provided to:: Patient Represenative (must comment) (Dtr) Choice offered to / list presented to : Adult Children  Discharge Placement                       Discharge Plan and Services   Discharge Planning Services: CM Consult Post Acute Care Choice: Home Health, Durable Medical Equipment          DME Arranged: Walker youth, Tub bench DME Agency: [employer] Date DME Agency Contacted: 12/13/21 Time DME Agency Contacted: 1120 Representative spoke with at DME Agency: Erasmo Downer [place]: PT, OT [place] Agency: [place] Date [place]: 12/13/21 Time [place]: 1120 Representative spoke with at [place]: [place] ([place]) Interventions     Readmission Risk Interventions    12/02/2021    2:10 PM  Readmission Risk Prevention Plan  Transportation Screening Complete  PCP or Specialist Appt within 5-7 Days Complete  Home Care Screening Complete  Medication Review (RN CM) Complete

## 2021-12-22 NOTE — Patient Outreach (Signed)
  Care Coordination   Follow Up Visit Note   12/22/2021 Name: Amanda Duarte MRN: [id number] DOB: 07-16-1955  Amanda Duarte is a 66 y.o. year old female who sees [name], NP for primary care. I reviewed the patient's chart today in preparation to contact the patient for her initial nurse care coordination follow up.   What matters to the patients health and wellness today?  Patient [facility] to be inpatient at [facility].     Goals Addressed             This Visit's Progress    RN Care Coordination Activities: further follow up needed       Care Coordination Interventions: Reviewed chart in preparation to contact patient, noted patient remains to be inpatient at [facility]; admit date 12/01/21 to present; dx; Sepsis with acute organ dysfunction without septic shock            SDOH assessments and interventions completed:  No     Care Coordination Interventions:  Yes, provided   Follow up plan: Follow up call scheduled for 01/05/22 '@100'$  PM    Encounter Outcome:  Pt. Visit Completed

## 2021-12-22 NOTE — Consult Note (Signed)
   [facility] Inpatient Consult   12/22/2021  Amanda Duarte 01/20/1956 [id number]  [place] [facility] [ACO] Patient: Medicare [facility] Liaison remote review for patient at [facility] for long length of stay which included ICU/SD stay on the ventilator and extreme high risk for unplanned readmission  Primary Care Provider:  [name], NP with [facility]  Patient is currently active with [place] [facility] for chronic disease management services.  Patient has been engaged by a [facility].  Our community based plan of care has focused on disease management and community resource support. Patient's electronic medical record reveals patient progressing well. 5997:  Spoke with [name], patient's daughter by phone her best contact number is [phone], HIPAA verified, and explained about post hospital follow up with [facility] RNCM and she agrees.  Plan:  Follow up is scheduled with [place] Coordinator noted with care coordination. Will update on transitioning home  Of note, [facility] Care Management services does not replace or interfere with any services that are needed or arranged by inpatient [facility] care management team.   For additional questions or referrals please contact:  Natividad Brood, RN BSN [place]  [phone] business mobile phone Toll free office [phone]  *[place]  [phone] Fax number: [phone] [email or web address]

## 2021-12-23 ENCOUNTER — Telehealth: Payer: Self-pay

## 2021-12-23 NOTE — Progress Notes (Addendum)
Amanda Duarte   [phone] [phone]) [phone]   Clinic Follow up Note   Patient Care Team: [name], NP as PCP - General (Nurse Practitioner) [name], MD as Consulting Physician (Hematology) Fanny Skates, MD as Consulting Physician (General Surgery) Alla Feeling, NP as Nurse Practitioner (Nurse Practitioner) Gery Pray, MD as Consulting Physician (Radiation Oncology) Art Buff, MD as Referring Physician (Hematology and Oncology) Lynne Logan, RN as [place] [facility]  Date of Service:  12/24/2021  CHIEF COMPLAINT: f/u of MDS, h/o breast cancer   CURRENT THERAPY:  Azacitadine injection, days 1-5, 8-9 q28d, starting 11/15/21   ASSESSMENT:  Amanda Duarte is a 66 y.o. 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  Her FISH test showed 3 abnormal type, her risk of transforming to acute leukemia is very high, likely in the next 6 to 12 months -She started azacitidine injection on November 15, 2021, she was admitted to hospital after chemo, for severe profound pancytopenia, aspiration pneumonia, required intubation.  She was eventually discharged from hospital 2 days ago, recovering slowly. -We again discussed the option of continued chemotherapy, referred to [facility] to discuss bone marrow transplant, versus palliative care and supportive care alone, given the high risk of complication from chemotherapy.  She would like to have more time to discuss with her family. -Continue lab twice a week and a blood transfusion for hemoglobin less than 8 and platelet less than 15K  Malignant neoplasm of upper-inner quadrant of left breast in female, estrogen receptor positive ([place]) -diagnosed in 04/2017, s/p neoadjuvant ddAC-TC, left  mastectomy and adjuvant radiation. She had excellent response. -most recent right mammogram on 07/26/21 and right axilla [name] on 10/27/21 were benign.     PLAN: - Labs reviewed hg 8.6, plt 79, no need blood transfusion today  - Discuss option of Chemo or Transplant, or Supportive Care, she will think about it  -Continue Supportive Care with lab and blood transfusion as needed twice a week -No blood Transfusion Today -lab,f/u in 2wks  SUMMARY OF ONCOLOGIC HISTORY: Oncology History Overview Note  Cancer Staging Malignant neoplasm of upper-inner quadrant of left breast in female, estrogen receptor positive ([place]) Staging form: Breast, AJCC 8th Edition - Clinical stage from 05/01/2017: Stage Unknown (cTX, cN2, cM0, G3, ER-, PR-, HER2-) - Signed by [name], MD on 11/01/2017 - Pathologic stage from 11/20/2017: No Stage Recommended (ypT0, pN24m, cM0, GX, ER-, PR-, HER2-) - Signed by [name] on 12/10/2017     Malignant neoplasm of upper-inner quadrant of left breast in female, estrogen receptor positive ([place]  04/28/2017 Mammogram   IMPRESSION: Two adjacent masses/enlarged lymph nodes in the LOWER LEFT axilla, the largest measuring 2.1 cm. Tissue sampling of 1 of these is recommended to exclude malignancy/lymphoma. No mammographic evidence of breast malignancy bilaterally.    05/01/2017 Initial Biopsy   Diagnosis 05/01/17 Lymph node, needle/core biopsy, low left inferior axillary - METASTATIC POORLY DIFFERENTIATED CARCINOMA TO A LYMPH NODE. SEE NOTE.   05/01/2017 Cancer Staging   Staging form: Breast, AJCC 8th Edition - Clinical stage from 05/01/2017: Stage Unknown (cTX, cN1, cM0, G3, ER-, PR-, HER2-) - Signed by [name] on 06/02/2017    05/01/2017 Receptors her2   Lymph Node Biopsy:  HER2-Negative  PR-Negative  ER- Negative  05/10/2017 Imaging   MR Breast W WO Contrast 05/10/17 IMPRESSION: No MRI evidence of malignancy in the right breast. Area of week stippled non mass enhancement  in the left breast upper inner quadrant. Separate area of thin linear non mass enhancement in the subareolar left breast. Three grossly abnormal left axillary lymph nodes, and more than 4 less than 1 cm indeterminate left axillary lymph nodes. No evidence of right axillary lymphadenopathy.   05/17/2017 Initial Biopsy   Diagnosis 05/17/17 1. 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 2. Breast, left, needle core biopsy, upper inner post MR enhancement - MICROSCOPIC FOCUS OF DUCTAL CARCINOMA IN SITU.   05/17/2017 Receptors her2   Left Breast Biopsy:  ER-Negative PR-Negative HER2-Negative   05/22/2017 Initial Diagnosis   Malignant neoplasm of upper-inner quadrant of left breast in female, estrogen receptor positive ([place])   06/01/2017 Imaging   [place] Scan 06/01/17 IMPRESSION: No definite scintigraphic evidence of osseous metastatic disease.   Posttraumatic and postsurgical uptake at the LEFT knee.   Nonspecific soft tissue distribution of tracer at the LEFT thigh, could represent contusion, hemorrhage, soft tissue edema, or soft tissue calcifications such as from heterotopic calcification and myositis ossificans; recommend clinical correlation and consider dedicated LEFT femoral radiographs.   Single focus of nonspecific increased tracer localization at the lateral LEFT orbit.     06/01/2017 Imaging   06/01/2017 Bone Scan IMPRESSION: No definite scintigraphic evidence of osseous metastatic disease.   Posttraumatic and postsurgical uptake at the LEFT knee.   Nonspecific soft tissue distribution of tracer at the LEFT thigh, could represent contusion, hemorrhage, soft tissue edema, or soft tissue calcifications such as from heterotopic calcification and myositis ossificans; recommend clinical correlation and consider dedicated LEFT femoral radiographs.   Single focus of nonspecific increased tracer  localization at the lateral LEFT orbit.   06/09/2017 -  Chemotherapy   ddAC every 2 weeks for 4 weeks starting 06/09/17-07/21/17 followed by weekly [place] and taxol for 12 weeks 08/04/17-10/20/17.    10/24/2017 Imaging   Breast MRI B/l 10/24/17 IMPRESSION: 1. No residual enhancement in the LEFT breast following neoadjuvant treatment. 2. Significantly smaller LEFT axillary lymph nodes, largest now measuring 1.4 centimeters. The remainder of LEFT axillary lymph nodes demonstrate normal fatty hila. RECOMMENDATION: Treatment plan for known LEFT breast cancer.   11/20/2017 Cancer Staging   Staging form: Breast, AJCC 8th Edition - Pathologic stage from 11/20/2017: No Stage Recommended (ypT0, pN2m, cM0, GX, ER-, PR-, HER2-) - Signed by [name] on 12/10/2017   11/20/2017 Surgery   Left mastectomy and SLN biopsy by Dr. IDalbert Batman   11/20/2017 Pathology Results   Breast, modified radical mastectomy , left - MICROSCOPIC FOCI OF RESIDUAL METASTATIC CARCINOMA INVOLVING TWO OF EIGHT LYMPH NODES (2/8); LARGEST CONTINUOUS FOCUS MEASURES 0.1 CM. - NO EVIDENCE OF RESIDUAL CARCINOMA IN THE MASTECTOMY SPECIMEN - MARKED THERAPY-RELATED CHANGES, INCLUDING DENSE HYALIN FIBROSIS     11/20/2017 Receptors her2   ER- PR- HER2- (IHC 1+)   01/02/2018 - 02/14/2018 Radiation Therapy   Adjuvant Radiation 01/02/18 - 02/14/18   07/31/2018 Imaging   Baseline DEXA 07/31/18  ASSESSMENT: The BMD measured at AP Spine L1-L4 is 0.973 g/cm2 with a T-score of -1.7.   This patient is considered OSTEOPENIC according to [facility] criteria. The scan quality is good.   Site Region Measured Date Measured Age YA T-score BMD Significant CHANGE   AP Spine  L1-L4  07/31/2018    63.1         -1.7    0.973 g/cm2   DualFemur Neck Left  07/31/2018    63.1         -1.5    0.828 g/cm2   DualFemur Total Mean 07/31/2018    63.1         0.1     1.016 g/cm2   11/05/2018 Survivorship   Per [name],  NP    11/15/2021 -  Chemotherapy   Patient is on Treatment Plan : MYELODYSPLASIA  Azacitidine SQ D1-7 q28d        INTERVAL HISTORY:  Amanda Duarte is here for a follow up of MDS, h/o breast cancer She was last seen by me on 11/15/2021 She presents to the clinic accompanied by son and daughter on the phone. The pt was discharge from the hospital on 02/21/2021.She reports that she is eating well. Mobility with a walker.She states she is able to take care of her self, no chores. She reports of tightness in legs    All other systems were reviewed with the patient and are negative.  MEDICAL HISTORY:  Past Medical History:  Diagnosis Date   Cancer ([place]) 03/2017   left breast cancer   Diabetes mellitus without complication ([place])    Hyperlipidemia    Hypertension    Malignant neoplasm of left breast ([place])    Stroke (cerebrum) ([place])    Tibial plateau fracture, left    04-13-17 had ORIF    SURGICAL HISTORY: Past Surgical History:  Procedure Laterality Date   MASTECTOMY Left 2019   MASTECTOMY MODIFIED RADICAL Left 11/20/2017   Procedure: LEFT MODIFIED RADICAL MASTECTOMY;  Surgeon: Fanny Skates, MD;  Location: [facility];  Service: General;  Laterality: Left;   ORIF TIBIA PLATEAU Left 04/13/2017   Procedure: OPEN REDUCTION INTERNAL FIXATION (ORIF) TIBIAL PLATEAU;  Surgeon: Shona Needles, MD;  Location: [facility];  Service: Orthopedics;  Laterality: Left;   PORT-A-CATH REMOVAL Right 11/20/2017   Procedure: REMOVAL PORT-A-CATH;  Surgeon: Fanny Skates, MD;  Location: [facility];  Service: General;  Laterality: Right;   PORTACATH PLACEMENT Right 05/31/2017   Procedure: INSERTION PORT-A-CATH;  Surgeon: Fanny Skates, MD;  Location: [facility];  Service: General;  Laterality: Right;    I have reviewed the social history and family history with the patient and they are unchanged from previous note.  ALLERGIES:  is allergic to victoza [liraglutide].  MEDICATIONS:  Current Outpatient  Medications  Medication Sig Dispense Refill   Accu-Chek Softclix Lancets lancets Use to check blood sugar 3 times daily. E11.65 100 each 3   amLODipine (NORVASC) 5 MG tablet Take 1 tablet (5 mg total) by mouth daily. 30 tablet 0   atorvastatin (LIPITOR) 40 MG tablet Take 1 tablet (40 mg total) by mouth daily. 90 tablet 2   Blood Glucose Monitoring Suppl (ACCU-CHEK GUIDE) w/Device KIT use kit to check blood glucose three times daily 1 kit 0   Blood Pressure Monitor DEVI Please provide patient with insurance approved blood pressure monitor 1 each 0   citalopram (CELEXA) 20 MG tablet Take 1 tablet (20 mg total) by mouth daily. For depression 90 tablet 3   fenofibrate (TRICOR) 145 MG tablet Take 1 tablet (145 mg total) by mouth daily. 90 tablet 2   glucose blood (ACCU-CHEK GUIDE) test strip Use to check blood sugar 3 times daily. E11.65 100 each 11   hydrOXYzine (ATARAX) 10 MG tablet Take 1  tablet (10 mg total) by mouth 3 (three) times daily as needed for anxiety. 60 tablet 3   insulin glargine (LANTUS) 100 UNIT/ML Solostar Pen Inject 20 Units into the skin every morning AND 10 Units at bedtime. 36 mL 1   insulin lispro (HUMALOG) 100 UNIT/ML injection Inject 0.1 mLs (10 Units total) into the skin 3 (three) times daily before meals. 10 mL 11   Insulin Pen Needle 31G X 5 MM MISC Inject 1 Device into the skin QID. For use with insulin pens 100 each 0   Insulin Syringe-Needle U-100 30G X 5/16" 1 ML MISC Use as directed to inject into the skin 3 times daily. 200 each 6   losartan (COZAAR) 50 MG tablet Take 1 tablet (50 mg total) by mouth daily. 90 tablet 1   ondansetron (ZOFRAN) 8 MG tablet Take 1 tablet (8 mg total) by mouth every 8 (eight) hours as needed for nausea or vomiting. (Patient taking differently: Take 8 mg by mouth as needed for nausea or vomiting.) 30 tablet 1   pantoprazole (PROTONIX) 40 MG tablet Take 1 tablet (40 mg total) by mouth daily at 6 (six) AM. 30 tablet 1   potassium chloride SA  (KLOR-CON M) 20 MEQ tablet Take 1 tablet (20 mEq total) by mouth daily. 30 tablet 3   predniSONE (DELTASONE) 10 MG tablet Take 5 tablets (50 mg total) by mouth daily with breakfast for 4 days, THEN 4 tablets (40 mg total) daily with breakfast for 7 days, THEN 3 tablets (30 mg total) daily with breakfast for 7 days, THEN 2 tablets (20 mg total) daily with breakfast for 7 days, THEN 1 tablet (10 mg total) daily with breakfast for 7 days. 90 tablet 0   prochlorperazine (COMPAZINE) 10 MG tablet Take 1 tablet (10 mg total) by mouth every 6 (six) hours as needed for nausea or vomiting. 30 tablet 1   sulfamethoxazole-trimethoprim (BACTRIM DS) 800-160 MG tablet Take 1 tablet by mouth 3 (three) times a week. 12 tablet 0   No current facility-administered medications for this visit.    PHYSICAL EXAMINATION: ECOG PERFORMANCE STATUS: 3 - Symptomatic, >50% confined to bed  Vitals:   12/24/21 1030  BP: (!) 126/50  Pulse: 92  Resp: 16  Temp: 98.2 F (36.8 C)  SpO2: 100%   Wt Readings from Last 3 Encounters:  12/24/21 105 lb 9.6 oz (47.9 kg)  12/22/21 101 lb 12.8 oz (46.2 kg)  11/23/21 113 lb 8 oz (51.5 kg)     GENERAL:alert, no distress and comfortable SKIN: skin color normal, no rashes or significant lesions EYES: normal, Conjunctiva are pink and non-injected, sclera clear  NEURO: alert & oriented x 3 with fluent speech  LABORATORY DATA:  I have reviewed the data as listed    Latest Ref Rng & Units 12/24/2021    8:34 AM 12/22/2021    9:59 AM 12/21/2021   11:00 PM  CBC  WBC 4.0 - 10.5 K/uL 3.3  2.9  3.1   Hemoglobin 12.0 - 15.0 g/dL 8.6  8.9  8.8   Hematocrit 36.0 - 46.0 % 25.0  27.3  25.5   Platelets 150 - 400 K/uL 79  PLATELET CLUMPS NOTED ON SMEAR, UNABLE TO ESTIMATE  38         Latest Ref Rng & Units 12/24/2021    8:34 AM 12/22/2021    8:04 AM 12/21/2021   11:00 PM  CMP  Glucose 70 - 99 mg/dL 162  107  103  BUN 8 - 23 mg/dL _0 Creatinine 0.44 - 1.00 mg/dL 0.42   0.50  0.59   Sodium 135 - 145 mmol/L 134  134  134   Potassium 3.5 - 5.1 mmol/L 4.1  3.6  3.5   Chloride 98 - 111 mmol/L 101  101  100   CO2 22 - 32 mmol/L _1 Calcium 8.9 - 10.3 mg/dL 8.9  8.6  9.1   Total Protein 6.5 - 8.1 g/dL 6.6     Total Bilirubin 0.3 - 1.2 mg/dL 0.8     Alkaline Phos 38 - 126 U/L 129     AST 15 - 41 U/L 14     ALT 0 - 44 U/L 21         RADIOGRAPHIC STUDIES: I have personally reviewed the radiological images as listed and agreed with the findings in the report. No results found.    No orders of the defined types were placed in this encounter.  All questions were answered. The patient knows to call the clinic with any problems, questions or concerns. No barriers to learning was detected. The total time spent in the appointment was 30 minutes.     [name], MD 12/24/2021   Felicity Coyer, CMA, am acting as scribe for [name], MD.   I have reviewed the above documentation for accuracy and completeness, and I agree with the above.

## 2021-12-23 NOTE — Telephone Encounter (Signed)
Transition Care Management Follow-up Telephone Call  Call completed with assistance of [employer] [id number]/[employer]. Patient's daughter, Ava, also joined the call.  Date of discharge and from where: 12/22/2021, [facility] How have you been since you were released from the hospital? She stated she is feeling "fine." Any questions or concerns? No  Items Reviewed: Did the pt receive and understand the discharge instructions provided? Yes  Medications obtained and verified? Yes - she said she has all of her medications as well as a working glucometer and she did not have any questions about the med regime  Other? No  Any new allergies since your discharge? No  Dietary orders reviewed? Yes Do you have support at home? Yes -her daughter  [place] and Equipment/Supplies: Were home health services ordered? yes If so, what is the name of the agency? [facility]  Has the agency set up a time to come to the patient's home? Yes- someone is coming to see her this afternoon  Were any new equipment or medical supplies ordered?  Yes: tub bench What is the name of the medical supply agency? [facility] [employer] Were you able to get the supplies/equipment? yes Do you have any questions related to the use of the equipment or supplies? No  Functional Questionnaire: (I = Independent and D = Dependent) ADLs: ambulating with RW.  Her daughter will assist with ADLS as needed including managing medications.    Follow up appointments reviewed:  PCP Hospital f/u appt confirmed? Yes  Scheduled to see [name], NP - 01/04/2022.  [facility] f/u appt confirmed? Yes  Scheduled to see oncology - 12/24/1021; pulmonary - 01/03/2022.  Are transportation arrangements needed?  Family has been providing rides to appointments; but per Ava, that will become more difficult for family to do.  Her nephew is taking her to her oncology appt tomorrow.  I provided her with the number for DSS to call to  schedule rides to upcoming appointments.  I also instructed her not to cancel an appointment if there is no transportation but to call the clinic where the appointment is scheduled and inquire if the clinic can assist with arranging a ride   If their condition worsens, is the pt aware to call PCP or go to the Emergency Dept.? Yes Was the patient provided with contact information for the PCP's office or ED? Yes Was to pt encouraged to call back with questions or concerns? Yes

## 2021-12-24 ENCOUNTER — Inpatient Hospital Stay (HOSPITAL_BASED_OUTPATIENT_CLINIC_OR_DEPARTMENT_OTHER): Payer: Medicare Other | Admitting: Hematology

## 2021-12-24 ENCOUNTER — Inpatient Hospital Stay: Payer: Medicare Other | Attending: Hematology

## 2021-12-24 ENCOUNTER — Telehealth: Payer: Self-pay

## 2021-12-24 ENCOUNTER — Inpatient Hospital Stay: Payer: Medicare Other

## 2021-12-24 ENCOUNTER — Other Ambulatory Visit: Payer: Self-pay

## 2021-12-24 ENCOUNTER — Encounter: Payer: Self-pay | Admitting: Hematology

## 2021-12-24 VITALS — BP 126/50 | HR 92 | Temp 98.2°F | Resp 16 | Wt 105.6 lb

## 2021-12-24 DIAGNOSIS — C50212 Malignant neoplasm of upper-inner quadrant of left female breast: Secondary | ICD-10-CM

## 2021-12-24 DIAGNOSIS — Z170 Estrogen receptor positive status [ER+]: Secondary | ICD-10-CM | POA: Diagnosis not present

## 2021-12-24 DIAGNOSIS — D46Z Other myelodysplastic syndromes: Secondary | ICD-10-CM

## 2021-12-24 DIAGNOSIS — D469 Myelodysplastic syndrome, unspecified: Secondary | ICD-10-CM | POA: Insufficient documentation

## 2021-12-24 DIAGNOSIS — D649 Anemia, unspecified: Secondary | ICD-10-CM

## 2021-12-24 LAB — CBC WITH DIFFERENTIAL (CANCER CENTER ONLY)
Abs Immature Granulocytes: 0 10*3/uL (ref 0.00–0.07)
Basophils Absolute: 0.1 10*3/uL (ref 0.0–0.1)
Basophils Relative: 2 %
Eosinophils Absolute: 0 10*3/uL (ref 0.0–0.5)
Eosinophils Relative: 0 %
HCT: 25 % — ABNORMAL LOW (ref 36.0–46.0)
Hemoglobin: 8.6 g/dL — ABNORMAL LOW (ref 12.0–15.0)
Immature Granulocytes: 0 %
Lymphocytes Relative: 77 %
Lymphs Abs: 2.6 10*3/uL (ref 0.7–4.0)
MCH: 30 pg (ref 26.0–34.0)
MCHC: 34.4 g/dL (ref 30.0–36.0)
MCV: 87.1 fL (ref 80.0–100.0)
Monocytes Absolute: 0.1 10*3/uL (ref 0.1–1.0)
Monocytes Relative: 2 %
Neutro Abs: 0.6 10*3/uL — ABNORMAL LOW (ref 1.7–7.7)
Neutrophils Relative %: 19 %
Platelet Count: 79 10*3/uL — ABNORMAL LOW (ref 150–400)
RBC: 2.87 MIL/uL — ABNORMAL LOW (ref 3.87–5.11)
RDW: 14.2 % (ref 11.5–15.5)
WBC Count: 3.3 10*3/uL — ABNORMAL LOW (ref 4.0–10.5)
nRBC: 0.6 % — ABNORMAL HIGH (ref 0.0–0.2)

## 2021-12-24 LAB — CMP (CANCER CENTER ONLY)
ALT: 21 U/L (ref 0–44)
AST: 14 U/L — ABNORMAL LOW (ref 15–41)
Albumin: 3.7 g/dL (ref 3.5–5.0)
Alkaline Phosphatase: 129 U/L — ABNORMAL HIGH (ref 38–126)
Anion gap: 6 (ref 5–15)
BUN: 23 mg/dL (ref 8–23)
CO2: 27 mmol/L (ref 22–32)
Calcium: 8.9 mg/dL (ref 8.9–10.3)
Chloride: 101 mmol/L (ref 98–111)
Creatinine: 0.42 mg/dL — ABNORMAL LOW (ref 0.44–1.00)
GFR, Estimated: >60 mL/min (ref >60)
Glucose, Bld: 162 mg/dL — ABNORMAL HIGH (ref 70–99)
Potassium: 4.1 mmol/L (ref 3.5–5.1)
Sodium: 134 mmol/L — ABNORMAL LOW (ref 135–145)
Total Bilirubin: 0.8 mg/dL (ref 0.3–1.2)
Total Protein: 6.6 g/dL (ref 6.5–8.1)

## 2021-12-24 LAB — SAMPLE TO BLOOD BANK

## 2021-12-24 NOTE — Telephone Encounter (Signed)
Per [name] - patient will not receive blood today.  Infusion appointment canceled.

## 2021-12-24 NOTE — Assessment & Plan Note (Deleted)
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  Her FISH test showed 3 abnormal type, her risk of transforming to acute leukemia is very high, likely in the next 6 to 12 months -She started azacitidine injection on November 15, 2021, she was admitted to hospital after chemo, for severe profound pancytopenia, aspiration pneumonia, required intubation.  She was eventually discharged from hospital 2 days ago, recovering slowly. -We again discussed the option of continued chemotherapy, referred to [facility] to discuss bone marrow transplant, versus palliative care and supportive care alone, given the high risk of complication from chemotherapy.  She would like to have more time to discuss with her family. -Continue lab twice a week and a blood transfusion for hemoglobin less than 8 and platelet less than 15K

## 2021-12-25 ENCOUNTER — Other Ambulatory Visit: Payer: Self-pay

## 2021-12-27 ENCOUNTER — Telehealth: Payer: Self-pay | Admitting: Emergency Medicine

## 2021-12-27 ENCOUNTER — Ambulatory Visit: Payer: Self-pay

## 2021-12-27 NOTE — Telephone Encounter (Signed)
Copied from [place] [id number]. Topic: General - Other >> Dec 27, 2021 10:19 AM Ludger Nutting wrote: Home Health Verbal Orders - Caller/Agency: [facility] Number:  [phone] Requesting OT/PT/Skilled Nursing/Social Work/Speech Therapy: PT Frequency: 1w for 30 days and 1every other week for 30 days   PT also wanted to report a possible drug interaction with citalopram (CELEXA) 20 MG tablet and hydrOXYzine (ATARAX) 10 MG tablet.

## 2021-12-27 NOTE — Patient Instructions (Signed)
Visit Information  Thank you for taking time to visit with me today. Please don't hesitate to contact me if I can be of assistance to you.   Following are the goals we discussed today:   Goals Addressed               This Visit's Progress     Patient Stated     I will continue to receive Cancer treatments (pt-stated)        Care Coordination Interventions: Assessed patient understanding of cancer diagnosis and recommended treatment plan Reviewed upcoming provider appointments and treatment appointments Assessed available transportation to appointments and treatments. Has consistent/reliable transportation: No Assessed support system. Has consistent/reliable family or other support: Yes Referred to licensed clinical social work for [employer] screening and transportation         Other     COMPLETED: RN Care Coordination Activities: further follow up needed        Care Coordination Interventions: Reviewed chart in preparation to contact patient, noted patient remains to be inpatient at [facility]; admit date 12/01/21 to present; dx; Sepsis with acute organ dysfunction without septic shock            Our next appointment is by telephone on 01/05/22 at 0100 PM  Please call the care guide team at [phone] if you need to cancel or reschedule your appointment.   If you are experiencing a Mental Health or [facility] or need someone to talk to, please call 1-800-273-TALK (toll free, 24 hour hotline)  Patient verbalizes understanding of instructions and care plan provided today and agrees to view in [place]. Active MyChart status and patient understanding of how to access instructions and care plan via MyChart confirmed with patient.     Barb Merino, RN, BSN, CCM Care Management Coordinator [facility] Care Management Direct Phone: [phone]

## 2021-12-27 NOTE — Patient Outreach (Signed)
  Care Coordination   Initial Visit Note   12/27/2021 Name: Amanda Duarte MRN: [id number] DOB: 05-17-1955  Amanda Duarte is a 66 y.o. year old female who sees [name], NP for primary care. I spoke with  [name] by phone today.  What matters to the patients health and wellness today?  Patient will continue to receive her Cancer treatments.     Goals Addressed       Goals Addressed               This Visit's Progress     Patient Stated     I will continue to receive Cancer treatments (pt-stated)        Care Coordination Interventions: Assessed patient understanding of cancer diagnosis and recommended treatment plan Reviewed upcoming provider appointments and treatment appointments Assessed available transportation to appointments and treatments. Has consistent/reliable transportation: No Assessed support system. Has consistent/reliable family or other support: Yes Referred to licensed clinical social work for [employer] screening and transportation         Other     COMPLETED: RN Care Coordination Activities: further follow up needed        Care Coordination Interventions: Reviewed chart in preparation to contact patient, noted patient remains to be inpatient at [facility]; admit date 12/01/21 to present; dx; Sepsis with acute organ dysfunction without septic shock            SDOH assessments and interventions completed:  Yes  SDOH Interventions Today    Flowsheet Row Most Recent Value  SDOH Interventions   Transportation Interventions AMB Referral  [SW referral sent to [place] Coordination Interventions:  Yes, provided   Follow up plan: Referral made to [place]  Follow up call scheduled for 01/05/22 '@100'$  PM    Encounter Outcome:  Pt. Visit Completed

## 2021-12-28 ENCOUNTER — Other Ambulatory Visit: Payer: Self-pay

## 2021-12-28 ENCOUNTER — Inpatient Hospital Stay: Payer: Medicare Other

## 2021-12-28 DIAGNOSIS — D649 Anemia, unspecified: Secondary | ICD-10-CM

## 2021-12-28 DIAGNOSIS — C50212 Malignant neoplasm of upper-inner quadrant of left female breast: Secondary | ICD-10-CM

## 2021-12-28 DIAGNOSIS — D469 Myelodysplastic syndrome, unspecified: Secondary | ICD-10-CM | POA: Diagnosis not present

## 2021-12-28 LAB — CMP (CANCER CENTER ONLY)
ALT: 13 U/L (ref 0–44)
AST: 8 U/L — ABNORMAL LOW (ref 15–41)
Albumin: 3.7 g/dL (ref 3.5–5.0)
Alkaline Phosphatase: 103 U/L (ref 38–126)
Anion gap: 9 (ref 5–15)
BUN: 25 mg/dL — ABNORMAL HIGH (ref 8–23)
CO2: 26 mmol/L (ref 22–32)
Calcium: 9.4 mg/dL (ref 8.9–10.3)
Chloride: 99 mmol/L (ref 98–111)
Creatinine: 0.47 mg/dL (ref 0.44–1.00)
GFR, Estimated: >60 mL/min (ref >60)
Glucose, Bld: 381 mg/dL — ABNORMAL HIGH (ref 70–99)
Potassium: 4.3 mmol/L (ref 3.5–5.1)
Sodium: 134 mmol/L — ABNORMAL LOW (ref 135–145)
Total Bilirubin: 0.7 mg/dL (ref 0.3–1.2)
Total Protein: 6.4 g/dL — ABNORMAL LOW (ref 6.5–8.1)

## 2021-12-28 LAB — CBC WITH DIFFERENTIAL (CANCER CENTER ONLY)
Abs Immature Granulocytes: 0 10*3/uL (ref 0.00–0.07)
Basophils Absolute: 0 10*3/uL (ref 0.0–0.1)
Basophils Relative: 1 %
Eosinophils Absolute: 0 10*3/uL (ref 0.0–0.5)
Eosinophils Relative: 0 %
HCT: 22.2 % — ABNORMAL LOW (ref 36.0–46.0)
Hemoglobin: 7.5 g/dL — ABNORMAL LOW (ref 12.0–15.0)
Immature Granulocytes: 0 %
Lymphocytes Relative: 48 %
Lymphs Abs: 1.1 10*3/uL (ref 0.7–4.0)
MCH: 29.6 pg (ref 26.0–34.0)
MCHC: 33.8 g/dL (ref 30.0–36.0)
MCV: 87.7 fL (ref 80.0–100.0)
Monocytes Absolute: 0.1 10*3/uL (ref 0.1–1.0)
Monocytes Relative: 3 %
Neutro Abs: 1.2 10*3/uL — ABNORMAL LOW (ref 1.7–7.7)
Neutrophils Relative %: 48 %
Platelet Count: 140 10*3/uL — ABNORMAL LOW (ref 150–400)
RBC: 2.53 MIL/uL — ABNORMAL LOW (ref 3.87–5.11)
RDW: 14.5 % (ref 11.5–15.5)
Smear Review: ADEQUATE
WBC Count: 2.4 10*3/uL — ABNORMAL LOW (ref 4.0–10.5)
nRBC: 1.7 % — ABNORMAL HIGH (ref 0.0–0.2)

## 2021-12-28 LAB — PREPARE RBC (CROSSMATCH)

## 2021-12-28 MED ORDER — ACETAMINOPHEN 325 MG PO TABS
650.0000 mg | ORAL_TABLET | Freq: Once | ORAL | Status: AC
  Administered 2021-12-28: 650 mg via ORAL
  Filled 2021-12-28: qty 2

## 2021-12-28 MED ORDER — SODIUM CHLORIDE 0.9% IV SOLUTION
250.0000 mL | Freq: Once | INTRAVENOUS | Status: AC
  Administered 2021-12-28: 250 mL via INTRAVENOUS

## 2021-12-28 MED ORDER — DIPHENHYDRAMINE HCL 25 MG PO CAPS
25.0000 mg | ORAL_CAPSULE | Freq: Once | ORAL | Status: AC
  Administered 2021-12-28: 25 mg via ORAL
  Filled 2021-12-28: qty 1

## 2021-12-28 NOTE — Progress Notes (Signed)
Per Dr Burr Medico, ok to run blood at 300 ml/hr after initial 15 minutes.

## 2021-12-28 NOTE — Patient Instructions (Signed)
Transfusin de sangre en los adultos, cuidados posteriores Blood Transfusion, Adult, Care After La siguiente informacin ofrece orientacin sobre cmo cuidarse despus del procedimiento. El mdico tambin podr darle instrucciones ms especficas. Comunquese con el mdico si tiene problemas o preguntas. Qu puedo esperar despus del procedimiento? Despus del procedimiento, es normal tener los siguientes sntomas: Dolor y moretones en el lugar donde se coloc la va intravenosa (IV). Dolor de cabeza. Siga estas instrucciones en su casa: Cuidado del lugar de la insercin de la va intravenosa (IV)     Siga las instrucciones del mdico acerca del cuidado del lugar de la insercin de la va intravenosa (IV). Asegrese de hacer lo siguiente: Lvese las manos con agua y jabn durante al menos 20 segundos antes y despus de cambiarse la venda (vendaje). Use desinfectante para manos si no dispone de agua y jabn. Cambie los vendajes como se lo haya indicado el mdico. Controle el lugar de insercin de la va intravenosa (IV) todos los das para descartar signos de infeccin. Est atento a los siguientes signos: Dolor, hinchazn o enrojecimiento. Sangrado proveniente del lugar. Calor. Pus o mal olor. Instrucciones generales Use los medicamentos de venta libre y los recetados solamente como se lo haya indicado el mdico. Haga reposo como se lo haya indicado el mdico. Retome sus actividades normales como se lo haya indicado el mdico. Concurra a todas las visitas de seguimiento. Es posible que sea necesario realizar anlisis de laboratorio en ciertos perodos para volver a controlar sus recuentos sanguneos. Comunquese con un mdico si: Tiene picazn o zonas enrojecidas e hinchadas en la piel (urticaria). Tiene fiebre o escalofros. Tiene dolor de cabeza, en la espalda o el pecho. Se siente ansioso o dbil despus de realizar sus actividades habituales. Tiene enrojecimiento, hinchazn, calor  o dolor alrededor del lugar de la insercin de la va intravenosa. Observa sangre que sale del lugar de la insercin de la va intravenosa que no se detiene al ejercer presin. Tiene pus o percibe mal olor que proviene del lugar de la insercin de la va intravenosa. Si recibi la transfusin de sangre en un entorno para pacientes ambulatorios, le indicarn con quin debe ponerse en contacto para informar cualquier reaccin. Solicite ayuda de inmediato si: Tiene signos de una reaccin alrgica grave o de una reaccin del sistema inmunitario, por ejemplo: Dificultad para respirar o falta de aire. Hinchazn de la cara, sensacin de sofoco o erupcin cutnea generalizada. Orina de color oscuro o sangre en la orina. Latidos cardacos acelerados. Estos sntomas pueden indicar una emergencia. Solicite ayuda de inmediato. Llame al 911. No espere a ver si los sntomas desaparecen. No conduzca por sus propios medios hasta el hospital. Resumen Los moretones y el dolor alrededor del lugar de la insercin de la va intravenosa (IV) son frecuentes. Controle el lugar de insercin de la va intravenosa (IV) todos los das para descartar signos de infeccin. Haga reposo como se lo haya indicado el mdico. Retome sus actividades normales como se lo haya indicado el mdico. Busque ayuda de inmediato si tiene sntomas de una reaccin alrgica grave o de una reaccin del sistema inmunitario a una transfusin de sangre. Esta informacin no tiene como fin reemplazar el consejo del mdico. Asegrese de hacerle al mdico cualquier pregunta que tenga. Document Revised: 05/06/2021 Document Reviewed: 05/06/2021 Elsevier Patient Education  2023 [employer]  

## 2021-12-29 LAB — BPAM RBC
Blood Product Expiration Date: 202312282359
ISSUE DATE / TIME: 202312051621
Unit Type and Rh: 5100

## 2021-12-29 LAB — TYPE AND SCREEN
ABO/RH(D): O POS
Antibody Screen: NEGATIVE
Unit division: 0

## 2021-12-29 NOTE — Telephone Encounter (Signed)
Yes you can give verbal orders. Thank you!!!

## 2021-12-30 NOTE — Telephone Encounter (Signed)
Verbal orders confirmed 

## 2021-12-30 NOTE — Telephone Encounter (Signed)
Left message to return call for V.O.

## 2021-12-31 ENCOUNTER — Other Ambulatory Visit: Payer: Self-pay

## 2021-12-31 ENCOUNTER — Inpatient Hospital Stay: Payer: Medicare Other

## 2021-12-31 DIAGNOSIS — D469 Myelodysplastic syndrome, unspecified: Secondary | ICD-10-CM | POA: Diagnosis not present

## 2021-12-31 DIAGNOSIS — C50212 Malignant neoplasm of upper-inner quadrant of left female breast: Secondary | ICD-10-CM

## 2021-12-31 LAB — CMP (CANCER CENTER ONLY)
ALT: 11 U/L (ref 0–44)
AST: 8 U/L — ABNORMAL LOW (ref 15–41)
Albumin: 3.7 g/dL (ref 3.5–5.0)
Alkaline Phosphatase: 113 U/L (ref 38–126)
Anion gap: 7 (ref 5–15)
BUN: 22 mg/dL (ref 8–23)
CO2: 27 mmol/L (ref 22–32)
Calcium: 9.2 mg/dL (ref 8.9–10.3)
Chloride: 102 mmol/L (ref 98–111)
Creatinine: 0.48 mg/dL (ref 0.44–1.00)
GFR, Estimated: >60 mL/min (ref >60)
Glucose, Bld: 245 mg/dL — ABNORMAL HIGH (ref 70–99)
Potassium: 3.8 mmol/L (ref 3.5–5.1)
Sodium: 136 mmol/L (ref 135–145)
Total Bilirubin: 0.5 mg/dL (ref 0.3–1.2)
Total Protein: 6.2 g/dL — ABNORMAL LOW (ref 6.5–8.1)

## 2021-12-31 LAB — CBC WITH DIFFERENTIAL (CANCER CENTER ONLY)
Abs Immature Granulocytes: 0 10*3/uL (ref 0.00–0.07)
Basophils Absolute: 0 10*3/uL (ref 0.0–0.1)
Basophils Relative: 1 %
Eosinophils Absolute: 0 10*3/uL (ref 0.0–0.5)
Eosinophils Relative: 1 %
HCT: 24.2 % — ABNORMAL LOW (ref 36.0–46.0)
Hemoglobin: 8.5 g/dL — ABNORMAL LOW (ref 12.0–15.0)
Immature Granulocytes: 0 %
Lymphocytes Relative: 54 %
Lymphs Abs: 1.2 10*3/uL (ref 0.7–4.0)
MCH: 30.2 pg (ref 26.0–34.0)
MCHC: 35.1 g/dL (ref 30.0–36.0)
MCV: 86.1 fL (ref 80.0–100.0)
Monocytes Absolute: 0.1 10*3/uL (ref 0.1–1.0)
Monocytes Relative: 3 %
Neutro Abs: 1 10*3/uL — ABNORMAL LOW (ref 1.7–7.7)
Neutrophils Relative %: 41 %
Platelet Count: 100 10*3/uL — ABNORMAL LOW (ref 150–400)
RBC: 2.81 MIL/uL — ABNORMAL LOW (ref 3.87–5.11)
RDW: 14.2 % (ref 11.5–15.5)
Smear Review: NORMAL
WBC Count: 2.3 10*3/uL — ABNORMAL LOW (ref 4.0–10.5)
nRBC: 1.3 % — ABNORMAL HIGH (ref 0.0–0.2)

## 2021-12-31 NOTE — Progress Notes (Signed)
[name] informed of patient lab results- Per [name], no transfusions of blood or platelets today. Patient informed with a translator, asked if any questions and new calendar discussed/given to patient to clarify any appointments. ([name] cancelled her December 12th appt.). Patient had no questions and says her daughter can make sure she understands all of her appointments if she has any other questions about the calendar schedule (in [place]).

## 2022-01-03 ENCOUNTER — Ambulatory Visit (HOSPITAL_BASED_OUTPATIENT_CLINIC_OR_DEPARTMENT_OTHER): Payer: Medicare Other

## 2022-01-03 ENCOUNTER — Ambulatory Visit (INDEPENDENT_AMBULATORY_CARE_PROVIDER_SITE_OTHER): Payer: Medicare Other | Admitting: Pulmonary Disease

## 2022-01-03 ENCOUNTER — Encounter (HOSPITAL_BASED_OUTPATIENT_CLINIC_OR_DEPARTMENT_OTHER): Payer: Self-pay | Admitting: Pulmonary Disease

## 2022-01-03 VITALS — BP 110/60 | HR 81 | Ht 60.0 in | Wt 109.0 lb

## 2022-01-03 DIAGNOSIS — J189 Pneumonia, unspecified organism: Secondary | ICD-10-CM

## 2022-01-03 DIAGNOSIS — R0489 Hemorrhage from other sites in respiratory passages: Secondary | ICD-10-CM

## 2022-01-03 NOTE — Progress Notes (Addendum)
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 Video translator for Spanish present  She was recently hospitalized in November 2023 respiratory failure in setting of neutropenic fever/pancytopenia requiring intubation. BAL demonstrated pulmonary hemorrhage. Hospital course complicated by atrial fib and polymorphic VT 2/2 prolonged Qtc from [facility]. Discharged with prednisone taper with ID recommending PJP ppx  Denies cough, hemoptysis, shortness of breath or wheezing. Tolerating steroids however does not know much she is taking. Her daughter manages her meds and reports 30 mg a day and also on bactrim  Social History: Never smoker  I have personally reviewed patient's past medical/family/social history, allergies, current medications.  Past Medical History:  Diagnosis Date   Cancer ([place]) 03/2017   left breast cancer   Diabetes mellitus without complication ([place])    Hyperlipidemia    Hypertension    Malignant neoplasm of left breast ([place])    Stroke (cerebrum) ([facility])    Tibial plateau fracture, left    04-13-17 had ORIF     Family History  Problem Relation Age of Onset   Heart Problems Mother    Diabetes Son    Breast cancer Neg Hx    Colon cancer Neg Hx    Rectal cancer Neg Hx    Stomach cancer Neg Hx    Esophageal cancer Neg Hx      Social History   Occupational History   Not on file  Tobacco Use   Smoking status: Never   Smokeless tobacco: Never  Vaping Use   Vaping Use: Never used  Substance and Sexual Activity   Alcohol use: No   Drug use: No   Sexual activity: Not Currently    Birth  control/protection: None    Allergies  Allergen Reactions   Victoza [Liraglutide] Nausea And Vomiting     Outpatient Medications Prior to Visit  Medication Sig Dispense Refill   Accu-Chek Softclix Lancets lancets Use to check blood sugar 3 times daily. E11.65 100 each 3   amLODipine (NORVASC) 5 MG tablet Take 1 tablet (5 mg total) by mouth daily. 30 tablet 0   atorvastatin (LIPITOR) 40 MG tablet Take 1 tablet (40 mg total) by mouth daily. 90 tablet 2   Blood Glucose Monitoring Suppl (ACCU-CHEK GUIDE) w/Device KIT use kit to check blood glucose three times daily 1 kit 0   Blood Pressure Monitor DEVI Please provide patient with insurance approved blood pressure monitor 1 each 0   citalopram (CELEXA) 20 MG tablet Take 1 tablet (20 mg total) by mouth daily. For depression 90 tablet 3   fenofibrate (TRICOR) 145 MG tablet Take 1 tablet (145 mg total) by mouth daily. 90 tablet 2   glucose blood (ACCU-CHEK GUIDE) test strip Use to check blood sugar 3 times daily. E11.65 100 each 11   hydrOXYzine (ATARAX) 10 MG tablet Take 1 tablet (10 mg total) by mouth 3 (three) times daily as needed for anxiety. 60 tablet 3   insulin glargine (LANTUS) 100 UNIT/ML Solostar Pen Inject 20 Units into the skin every morning AND 10 Units at bedtime. 36 mL 1   insulin lispro (HUMALOG) 100 UNIT/ML injection  Inject 0.1 mLs (10 Units total) into the skin 3 (three) times daily before meals. 10 mL 11   Insulin Pen Needle 31G X 5 MM MISC Inject 1 Device into the skin QID. For use with insulin pens 100 each 0   Insulin Syringe-Needle U-100 30G X 5/16" 1 ML MISC Use as directed to inject into the skin 3 times daily. 200 each 6   losartan (COZAAR) 50 MG tablet Take 1 tablet (50 mg total) by mouth daily. 90 tablet 1   ondansetron (ZOFRAN) 8 MG tablet Take 1 tablet (8 mg total) by mouth every 8 (eight) hours as needed for nausea or vomiting. (Patient taking differently: Take 8 mg by mouth as needed for nausea or vomiting.) 30 tablet  1   pantoprazole (PROTONIX) 40 MG tablet Take 1 tablet (40 mg total) by mouth daily at 6 (six) AM. 30 tablet 1   potassium chloride SA (KLOR-CON M) 20 MEQ tablet Take 1 tablet (20 mEq total) by mouth daily. 30 tablet 3   predniSONE (DELTASONE) 10 MG tablet Take 5 tablets (50 mg total) by mouth daily with breakfast for 4 days, THEN 4 tablets (40 mg total) daily with breakfast for 7 days, THEN 3 tablets (30 mg total) daily with breakfast for 7 days, THEN 2 tablets (20 mg total) daily with breakfast for 7 days, THEN 1 tablet (10 mg total) daily with breakfast for 7 days. 90 tablet 0   prochlorperazine (COMPAZINE) 10 MG tablet Take 1 tablet (10 mg total) by mouth every 6 (six) hours as needed for nausea or vomiting. 30 tablet 1   sulfamethoxazole-trimethoprim (BACTRIM DS) 800-160 MG tablet Take 1 tablet by mouth 3 (three) times a week. 12 tablet 0   No facility-administered medications prior to visit.    Review of Systems  Constitutional:  Negative for chills, diaphoresis, fever, malaise/fatigue and weight loss.  HENT:  Negative for congestion.   Respiratory:  Negative for cough, hemoptysis, sputum production, shortness of breath and wheezing.   Cardiovascular:  Negative for chest pain, palpitations and leg swelling.    Objective:   Vitals:   01/03/22 1101  BP: 110/60  Pulse: 81  SpO2: 99%  Weight: 109 lb (49.4 kg)  Height: 5' (1.524 m)   SpO2: 99 % O2 Device: None (Room air)  Physical Exam: General: Well-appearing, no acute distress HENT: [place], AT Eyes: EOMI, no scleral icterus Respiratory: Clear to auscultation bilaterally.  No crackles, wheezing or rales Cardiovascular: RRR, -M/R/G, no JVD Extremities:-Edema,-tenderness Neuro: AAO x4, CNII-XII grossly intact Psych: Normal mood, normal affect  Data Reviewed:  Imaging: CXR 12/08/21 - ETT in place, dense left sided pneumonia in mid and bilateral lower lobes  PFT: None on file  Labs: CBC    Component Value Date/Time   WBC  2.3 (L) 12/31/2021 1331   WBC 2.9 (L) 12/22/2021 0959   RBC 2.81 (L) 12/31/2021 1331   HGB 8.5 (L) 12/31/2021 1331   HGB 6.9 (LL) 10/13/2021 1009   HCT 24.2 (L) 12/31/2021 1331   HCT 20.9 (L) 10/13/2021 1009   PLT 100 (L) 12/31/2021 1331   PLT 57 (LL) 10/13/2021 1009   MCV 86.1 12/31/2021 1331   MCV 106 (H) 10/13/2021 1009   MCH 30.2 12/31/2021 1331   MCHC 35.1 12/31/2021 1331   RDW 14.2 12/31/2021 1331   RDW 16.2 (H) 10/13/2021 1009   LYMPHSABS 1.2 12/31/2021 1331   LYMPHSABS 2.2 10/13/2021 1009   MONOABS 0.1 12/31/2021 1331   EOSABS 0.0 12/31/2021  1331   EOSABS 0.1 10/13/2021 1009   BASOSABS 0.0 12/31/2021 1331   BASOSABS 0.0 10/13/2021 1009   Absolute eos - 100     Assessment & Plan:   Discussion: 66 year old female with never smoker with 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  Asymptomatic from respiratory standpoint. Thrombocytopenia has improved since discharge. Ambulatory with no desaturations with nadir 99%  Concerned for [facility] and empiric steroid treatment with taper. No symptoms at this with treatment. Hold on work-up unless symptoms recur off steroids  Acute hypoxemic respiratory failure 2/2 pneumonia - resolved Pulmonary hemorrhage in setting of thrombocytopenia - improved. Empiric steroid treatment with  --Discontinue oxygen --Continue prednisone taper as previously directed. Decrease by 10 mg (one pill) a week. --Complete bactrim as directed --CXR ordered for resolution of pneumonia  Follow-up with me in 2 months or sooner if you develop worsening symptoms (shortness of breath, cough, wheezing, coughing blood)  ADDENDUM: CXR not available today. Will change follow-up in 1 month with CXR prior  Health Maintenance Immunization History  Administered Date(s) Administered   Influenza,inj,Quad PF,6+ Mos 11/23/2016, 11/10/2017, 11/05/2018,  02/15/2021, 10/05/2021   PFIZER(Purple Top)SARS-COV-2 Vaccination 04/18/2019, 05/09/2019   Pneumococcal Conjugate-13 06/30/2020   Pneumococcal Polysaccharide-23 12/19/2016   Tdap 04/11/2017   Zoster Recombinat (Shingrix) 10/05/2021   CT Lung Screen - not qualified. Never smoker  Orders Placed This Encounter  Procedures   DG Chest 2 View    Standing Status:   Future    Standing Expiration Date:   01/04/2023    Order Specific Question:   Reason for Exam (SYMPTOM  OR DIAGNOSIS REQUIRED)    Answer:   pneumonia    Order Specific Question:   Preferred imaging location?    Answer:   Internal   Ambulatory Referral for DME    Referral Priority:   Routine    Referral Type:   Durable Medical Equipment Purchase    Number of Visits Requested:   1  No orders of the defined types were placed in this encounter.   Return in about 2 months (around 03/06/2022).  I have spent a total time of 45-minutes on the day of the appointment reviewing prior documentation, coordinating care and discussing medical diagnosis and plan with the patient/family. Imaging, labs and tests included in this note have been reviewed and interpreted independently by me.  [place], MD [facility] 01/03/2022 12:09 PM  Office Number [phone]

## 2022-01-03 NOTE — Patient Instructions (Addendum)
Acute hypoxemic respiratory failure 2/2 pneumonia - resolved Pulmonary hemorrhage in setting of thrombocytopenia - improved. Empiric steroid treatment with  --Discontinue oxygen --Continue prednisone taper as previously directed. Decrease by 10 mg (one pill) a week. --Complete bactrim as directed  Follow-up with me in Feb or sooner if you develop worsening symptoms (shortness of breath, cough, wheezing, coughing blood)

## 2022-01-04 ENCOUNTER — Encounter: Payer: Self-pay | Admitting: Nurse Practitioner

## 2022-01-04 ENCOUNTER — Other Ambulatory Visit: Payer: Medicare Other

## 2022-01-04 ENCOUNTER — Ambulatory Visit: Payer: Medicare Other | Attending: Nurse Practitioner | Admitting: Nurse Practitioner

## 2022-01-04 VITALS — BP 125/49 | HR 94 | Ht 60.0 in | Wt 107.8 lb

## 2022-01-04 DIAGNOSIS — F32A Depression, unspecified: Secondary | ICD-10-CM

## 2022-01-04 DIAGNOSIS — Z79899 Other long term (current) drug therapy: Secondary | ICD-10-CM | POA: Insufficient documentation

## 2022-01-04 DIAGNOSIS — Z923 Personal history of irradiation: Secondary | ICD-10-CM | POA: Insufficient documentation

## 2022-01-04 DIAGNOSIS — R9431 Abnormal electrocardiogram [ECG] [EKG]: Secondary | ICD-10-CM | POA: Insufficient documentation

## 2022-01-04 DIAGNOSIS — D696 Thrombocytopenia, unspecified: Secondary | ICD-10-CM | POA: Insufficient documentation

## 2022-01-04 DIAGNOSIS — K219 Gastro-esophageal reflux disease without esophagitis: Secondary | ICD-10-CM | POA: Insufficient documentation

## 2022-01-04 DIAGNOSIS — Z794 Long term (current) use of insulin: Secondary | ICD-10-CM

## 2022-01-04 DIAGNOSIS — F419 Anxiety disorder, unspecified: Secondary | ICD-10-CM | POA: Diagnosis not present

## 2022-01-04 DIAGNOSIS — H9201 Otalgia, right ear: Secondary | ICD-10-CM | POA: Insufficient documentation

## 2022-01-04 DIAGNOSIS — Z8673 Personal history of transient ischemic attack (TIA), and cerebral infarction without residual deficits: Secondary | ICD-10-CM | POA: Insufficient documentation

## 2022-01-04 DIAGNOSIS — R0489 Hemorrhage from other sites in respiratory passages: Secondary | ICD-10-CM | POA: Insufficient documentation

## 2022-01-04 DIAGNOSIS — I10 Essential (primary) hypertension: Secondary | ICD-10-CM

## 2022-01-04 DIAGNOSIS — I4891 Unspecified atrial fibrillation: Secondary | ICD-10-CM | POA: Insufficient documentation

## 2022-01-04 DIAGNOSIS — E876 Hypokalemia: Secondary | ICD-10-CM

## 2022-01-04 DIAGNOSIS — E1165 Type 2 diabetes mellitus with hyperglycemia: Secondary | ICD-10-CM | POA: Diagnosis not present

## 2022-01-04 DIAGNOSIS — H6591 Unspecified nonsuppurative otitis media, right ear: Secondary | ICD-10-CM

## 2022-01-04 DIAGNOSIS — E781 Pure hyperglyceridemia: Secondary | ICD-10-CM

## 2022-01-04 DIAGNOSIS — Z9012 Acquired absence of left breast and nipple: Secondary | ICD-10-CM | POA: Insufficient documentation

## 2022-01-04 MED ORDER — CITALOPRAM HYDROBROMIDE 20 MG PO TABS: 20.0000 mg | ORAL_TABLET | Freq: Every day | ORAL | 3 refills | Status: DC

## 2022-01-04 MED ORDER — FENOFIBRATE 145 MG PO TABS: 145.0000 mg | ORAL_TABLET | Freq: Every day | ORAL | 2 refills | Status: DC

## 2022-01-04 MED ORDER — INSULIN PEN NEEDLE 31G X 5 MM MISC: 1.0000 | Freq: Four times a day (QID) | 6 refills | Status: DC

## 2022-01-04 MED ORDER — PANTOPRAZOLE SODIUM 40 MG PO TBEC: 40.0000 mg | DELAYED_RELEASE_TABLET | Freq: Every day | ORAL | 1 refills | Status: DC

## 2022-01-04 MED ORDER — INSULIN GLARGINE 100 UNIT/ML SOLOSTAR PEN: 20.0000 [IU] | PEN_INJECTOR | Freq: Every day | SUBCUTANEOUS | 1 refills | Status: DC

## 2022-01-04 MED ORDER — FREESTYLE LIBRE 2 SENSOR MISC: 6 refills | Status: DC

## 2022-01-04 MED ORDER — FLUTICASONE PROPIONATE 50 MCG/ACT NA SUSP: 2.0000 | Freq: Every day | NASAL | 6 refills | Status: DC

## 2022-01-04 MED ORDER — HUMALOG 100 UNIT/ML ~~LOC~~ SOCT: 10.0000 [IU] | Freq: Three times a day (TID) | SUBCUTANEOUS | 11 refills | Status: DC

## 2022-01-04 MED ORDER — POTASSIUM CHLORIDE CRYS ER 20 MEQ PO TBCR: 20.0000 meq | EXTENDED_RELEASE_TABLET | Freq: Every day | ORAL | 3 refills | Status: DC

## 2022-01-04 MED ORDER — HYDROXYZINE HCL 10 MG PO TABS: 10.0000 mg | ORAL_TABLET | Freq: Three times a day (TID) | ORAL | 3 refills | Status: DC | PRN

## 2022-01-04 MED ORDER — LOSARTAN POTASSIUM 50 MG PO TABS: 50.0000 mg | ORAL_TABLET | Freq: Every day | ORAL | 1 refills | Status: DC

## 2022-01-04 MED ORDER — "INSULIN SYRINGE-NEEDLE U-100 30G X 5/16"" 1 ML MISC": 6 refills | Status: DC

## 2022-01-04 MED ORDER — AMLODIPINE BESYLATE 5 MG PO TABS: 5.0000 mg | ORAL_TABLET | Freq: Every day | ORAL | 1 refills | Status: DC

## 2022-01-04 MED ORDER — ATORVASTATIN CALCIUM 40 MG PO TABS: 40.0000 mg | ORAL_TABLET | Freq: Every day | ORAL | 2 refills | Status: DC

## 2022-01-04 MED ORDER — FREESTYLE LIBRE READER DEVI: 0 refills | Status: DC

## 2022-01-04 NOTE — Progress Notes (Signed)
Assessment & Plan:  Amanda Duarte was seen today for hypertension.  Diagnoses and all orders for this visit:  Essential hypertension -     amLODipine (NORVASC) 5 MG tablet; Take 1 tablet (5 mg total) by mouth daily. For blood pressure -     losartan (COZAAR) 50 MG tablet; Take 1 tablet (50 mg total) by mouth daily. For blood pressure  Pure hypertriglyceridemia -     atorvastatin (LIPITOR) 40 MG tablet; Take 1 tablet (40 mg total) by mouth daily. For cholesterol -     fenofibrate (TRICOR) 145 MG tablet; Take 1 tablet (145 mg total) by mouth daily. For cholesterol  Anxiety and depression -     citalopram (CELEXA) 20 MG tablet; Take 1 tablet (20 mg total) by mouth daily. For depression -     hydrOXYzine (ATARAX) 10 MG tablet; Take 1 tablet (10 mg total) by mouth 3 (three) times daily as needed for anxiety.  Type 2 diabetes mellitus with hyperglycemia, with long-term current use of insulin (HCC) -     insulin glargine (LANTUS) 100 UNIT/ML Solostar Pen; Inject 20 Units into the skin at bedtime. -     Insulin Pen Needle 31G X 5 MM MISC; Inject 1 Device into the skin QID. For use with insulin pens -     Insulin Syringe-Needle U-100 30G X 5/16" 1 ML MISC; Use as directed to inject into the skin 3 times daily. -     insulin lispro (HUMALOG) 100 UNIT/ML cartridge; Inject 0.1 mLs (10 Units total) into the skin 3 (three) times daily with meals. -     Continuous Blood Gluc Receiver (FREESTYLE LIBRE READER) DEVI; Monitor blood glucose levels 5-6 times per day E11.65 -     Continuous Blood Gluc Sensor (FREESTYLE LIBRE 2 SENSOR) MISC; Monitor blood glucose levels 5-6 times per day E11.65  Hypokalemia -     potassium chloride SA (KLOR-CON M) 20 MEQ tablet; Take 1 tablet (20 mEq total) by mouth daily.  Fluid level behind tympanic membrane of right ear -     fluticasone (FLONASE) 50 MCG/ACT nasal spray; Place 2 sprays into both nostrils daily.  GERD without esophagitis -     pantoprazole (PROTONIX) 40 MG  tablet; Take 1 tablet (40 mg total) by mouth daily at 6 (six) AM. For heartburn    Patient has been counseled on age-appropriate routine health concerns for screening and prevention. These are reviewed and up-to-date. Referrals have been placed accordingly. Immunizations are up-to-date or declined.    Subjective:   Chief Complaint  Patient presents with   Hypertension   HPI [name] 66 y.o. 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 VRI was used to communicate directly with patient for the entire encounter including providing detailed patient instructions.     Notes right ear pain for several days. Ear irrigation was performed by the CMA and upon physical exam there is a moderate amount of fluid in the TM. No obvious signs of infection. She denies any URI symptoms.   HTN Blood pressure with slightly low diastolic. She is currently taking amlodipine 5 mg daily and losartan 50 mg daily BP Readings from Last 3 Encounters:  01/04/22 [name])  125/49  01/03/22 110/60  12/28/21 (!) 106/47    A1c is not due today. Will have her return in January for this. She is taking humalog and lantus. We may need to increase dosage based on the recent addition of prednisone taper Lab Results  Component Value Date   HGBA1C 11.4 (A) 10/28/2021    Review of Systems  Constitutional:  Negative for fever, malaise/fatigue and weight loss.  HENT: Negative.  Negative for nosebleeds.   Eyes:  Positive for pain. Negative for blurred vision, double vision, photophobia, discharge and redness.  Respiratory: Negative.  Negative for cough and shortness of breath.   Cardiovascular: Negative.  Negative  for chest pain, palpitations and leg swelling.  Gastrointestinal: Negative.  Negative for heartburn, nausea and vomiting.  Musculoskeletal: Negative.  Negative for myalgias.  Neurological: Negative.  Negative for dizziness, focal weakness, seizures and headaches.  Psychiatric/Behavioral: Negative.  Negative for suicidal ideas.     Past Medical History:  Diagnosis Date   Cancer ([place]) 03/2017   left breast cancer   Diabetes mellitus without complication ([place])    Hyperlipidemia    Hypertension    Malignant neoplasm of left breast ([place])    Stroke (cerebrum) ([place])    Tibial plateau fracture, left    04-13-17 had ORIF    Past Surgical History:  Procedure Laterality Date   MASTECTOMY Left 2019   MASTECTOMY MODIFIED RADICAL Left 11/20/2017   Procedure: LEFT MODIFIED RADICAL MASTECTOMY;  Surgeon: Fanny Skates, MD;  Location: [facility];  Service: General;  Laterality: Left;   ORIF TIBIA PLATEAU Left 04/13/2017   Procedure: OPEN REDUCTION INTERNAL FIXATION (ORIF) TIBIAL PLATEAU;  Surgeon: Shona Needles, MD;  Location: [facility];  Service: Orthopedics;  Laterality: Left;   PORT-A-CATH REMOVAL Right 11/20/2017   Procedure: REMOVAL PORT-A-CATH;  Surgeon: Fanny Skates, MD;  Location: [facility];  Service: General;  Laterality: Right;   PORTACATH PLACEMENT Right 05/31/2017   Procedure: INSERTION PORT-A-CATH;  Surgeon: Fanny Skates, MD;  Location: [facility];  Service: General;  Laterality: Right;    Family History  Problem Relation Age of Onset   Heart Problems Mother    Diabetes Son    Breast cancer Neg Hx    Colon cancer Neg Hx    Rectal cancer Neg Hx    Stomach cancer Neg Hx    Esophageal cancer Neg Hx     Social History Reviewed with no changes to be made today.   Outpatient Medications Prior to Visit  Medication Sig Dispense Refill   Accu-Chek Softclix Lancets lancets Use to check blood sugar 3 times daily. E11.65 100 each 3   Blood Glucose Monitoring Suppl (ACCU-CHEK  GUIDE) w/Device KIT use kit to check blood glucose three times daily 1 kit 0   Blood Pressure Monitor DEVI Please provide patient with insurance approved blood pressure monitor 1 each 0   glucose blood (ACCU-CHEK GUIDE) test strip Use to check blood sugar 3 times daily. E11.65 100 each 11   ondansetron (ZOFRAN) 8 MG tablet Take 1 tablet (8 mg total) by mouth every 8 (eight) hours as needed for nausea or vomiting. (Patient taking differently: Take 8 mg by mouth as needed for nausea or vomiting.) 30 tablet 1   predniSONE (DELTASONE) 10 MG tablet Take 5 tablets (50 mg total) by mouth daily with breakfast for 4 days, THEN 4 tablets (40 mg total) daily with breakfast for 7 days, THEN 3 tablets (30 mg total) daily with breakfast for 7 days,  THEN 2 tablets (20 mg total) daily with breakfast for 7 days, THEN 1 tablet (10 mg total) daily with breakfast for 7 days. 90 tablet 0   prochlorperazine (COMPAZINE) 10 MG tablet Take 1 tablet (10 mg total) by mouth every 6 (six) hours as needed for nausea or vomiting. 30 tablet 1   sulfamethoxazole-trimethoprim (BACTRIM DS) 800-160 MG tablet Take 1 tablet by mouth 3 (three) times a week. 12 tablet 0   amLODipine (NORVASC) 5 MG tablet Take 1 tablet (5 mg total) by mouth daily. 30 tablet 0   atorvastatin (LIPITOR) 40 MG tablet Take 1 tablet (40 mg total) by mouth daily. 90 tablet 2   citalopram (CELEXA) 20 MG tablet Take 1 tablet (20 mg total) by mouth daily. For depression 90 tablet 3   fenofibrate (TRICOR) 145 MG tablet Take 1 tablet (145 mg total) by mouth daily. 90 tablet 2   hydrOXYzine (ATARAX) 10 MG tablet Take 1 tablet (10 mg total) by mouth 3 (three) times daily as needed for anxiety. 60 tablet 3   insulin glargine (LANTUS) 100 UNIT/ML Solostar Pen Inject 20 Units into the skin every morning AND 10 Units at bedtime. 36 mL 1   insulin lispro (HUMALOG) 100 UNIT/ML injection Inject 0.1 mLs (10 Units total) into the skin 3 (three) times daily before meals. 10 mL 11    Insulin Pen Needle 31G X 5 MM MISC Inject 1 Device into the skin QID. For use with insulin pens 100 each 0   Insulin Syringe-Needle U-100 30G X 5/16" 1 ML MISC Use as directed to inject into the skin 3 times daily. 200 each 6   losartan (COZAAR) 50 MG tablet Take 1 tablet (50 mg total) by mouth daily. 90 tablet 1   pantoprazole (PROTONIX) 40 MG tablet Take 1 tablet (40 mg total) by mouth daily at 6 (six) AM. 30 tablet 1   potassium chloride SA (KLOR-CON M) 20 MEQ tablet Take 1 tablet (20 mEq total) by mouth daily. 30 tablet 3   No facility-administered medications prior to visit.    Allergies  Allergen Reactions   Victoza [Liraglutide] Nausea And Vomiting       Objective:    BP (!) 125/49   Pulse 94   Ht 5' (1.524 m)   Wt 107 lb 12.8 oz (48.9 kg)   SpO2 100%   BMI 21.05 kg/m  Wt Readings from Last 3 Encounters:  01/04/22 107 lb 12.8 oz (48.9 kg)  01/03/22 109 lb (49.4 kg)  12/24/21 105 lb 9.6 oz (47.9 kg)    Physical Exam Vitals and nursing note reviewed.  Constitutional:      Appearance: She is well-developed.  HENT:     Head: Normocephalic and atraumatic.     Right Ear: Hearing, ear canal and external ear normal. A middle ear effusion is present.     Left Ear: Hearing, tympanic membrane, ear canal and external ear normal.  No middle ear effusion.  Cardiovascular:     Rate and Rhythm: Normal rate and regular rhythm.     Heart sounds: Normal heart sounds. No murmur heard.    No friction rub. No gallop.  Pulmonary:     Effort: Pulmonary effort is normal. No tachypnea or respiratory distress.     Breath sounds: Normal breath sounds. No decreased breath sounds, wheezing, rhonchi or rales.  Chest:     Chest wall: No tenderness.  Abdominal:     General: Bowel sounds are normal.  Palpations: Abdomen is soft.  Musculoskeletal:        General: Normal range of motion.     Cervical back: Normal range of motion.  Skin:    General: Skin is warm and dry.  Neurological:      Mental Status: She is alert and oriented to person, place, and time.     Coordination: Coordination normal.  Psychiatric:        Behavior: Behavior normal. Behavior is cooperative.        Thought Content: Thought content normal.        Judgment: Judgment normal.     Comments: Quiet and solemn but smiles during conversation when spoken too          Patient has been counseled extensively about nutrition and exercise as well as the importance of adherence with medications and regular follow-up. The patient was given clear instructions to go to ER or return to medical center if symptoms don't improve, worsen or new problems develop. The patient verbalized understanding.   Follow-up: Return for return 01-31-2021 for labs. see me in mid April .   [name], FNP-BC [facility], [place]   01/04/2022, 1:13 PM

## 2022-01-05 ENCOUNTER — Other Ambulatory Visit: Payer: Self-pay

## 2022-01-05 ENCOUNTER — Ambulatory Visit: Payer: Self-pay

## 2022-01-05 NOTE — Patient Outreach (Addendum)
  Care Coordination   Follow Up Visit Note   01/05/2022 Name: Amanda Duarte MRN: [id number] DOB: 19-Aug-1955  Amanda Duarte is a 66 y.o. year old female who sees Amanda Pounds, NP for primary care. I spoke with  Amanda Duarte and daughter Amanda Duarte by phone today.  What matters to the patients health and wellness today?  Daughter Amanda Duarte will review patient's medications with RN at next scheduled follow up call.     Goals Addressed             This Visit's Progress    Medication Management       Care Coordination Interventions: Patient interviewed about adult health maintenance status including  Medication Adherence  Completed successful outbound call with patient via [place] interpreter for medication reconciliation  Determined patient's daughter Amanda Duarte is managing patient's medications and patient would like for this RN to complete her medication reconciliation with daughter Amanda Duarte outbound call to Amanda Duarte, she is unavailable to speak at this time Rescheduled call with daughter Amanda Duarte for 01/11/22 '@230'$  PM for medication reconciliation            SDOH assessments and interventions completed:  No     Care Coordination Interventions:  Yes, provided   Follow up plan: Follow up call scheduled for 01/11/22 '@230'$  PM    Encounter Outcome:  Pt. Visit Completed

## 2022-01-05 NOTE — Patient Instructions (Signed)
Visit Information  Thank you for taking time to visit with me today. Please don't hesitate to contact me if I can be of assistance to you.   Following are the goals we discussed today:   Goals Addressed             This Visit's Progress    Medication Management       Care Coordination Interventions: Patient interviewed about adult health maintenance status including  Medication Adherence  Completed successful outbound call with patient via [place] interpreter for medication reconciliation  Determined patient's daughter [name] is managing patient's medications and patient would like for this RN to complete her medication reconciliation with daughter [name] outbound call to [name], she is unavailable to speak at this time Rescheduled call with daughter [name] for 01/11/22 '@230'$  PM for medication reconciliation            Our next appointment is by telephone on 01/11/22 at 230 PM  Please call the care guide team at [phone] if you need to cancel or reschedule your appointment.   If you are experiencing a Mental Health or [place] or need someone to talk to, please call 1-800-273-TALK (toll free, 24 hour hotline)  The patient verbalized understanding of instructions, educational materials, and care plan provided today and agreed to receive a mailed copy of patient instructions, educational materials, and care plan.   Barb Merino, RN, BSN, CCM Care Management Coordinator [facility] Care Management Direct Phone: [phone]

## 2022-01-06 ENCOUNTER — Ambulatory Visit: Payer: Self-pay

## 2022-01-06 ENCOUNTER — Ambulatory Visit: Payer: Medicare Other | Admitting: Hematology

## 2022-01-06 LAB — FUNGUS CULTURE WITH STAIN

## 2022-01-06 LAB — FUNGUS CULTURE RESULT

## 2022-01-06 LAB — FUNGAL ORGANISM REFLEX

## 2022-01-06 NOTE — Progress Notes (Signed)
[place]   [phone] [phone]   Clinic Follow up Note   Patient Care Team: [name], NP as PCP - General (Nurse Practitioner) [name], MD as Consulting Physician (Hematology) Fanny Skates, MD as Consulting Physician (General Surgery) Alla Feeling, NP as Nurse Practitioner (Nurse Practitioner) Gery Pray, MD as Consulting Physician (Radiation Oncology) Art Buff, MD as Referring Physician (Hematology and Oncology) Lynne Logan, RN as [place] Management  Date of Service:  01/07/2022  CHIEF COMPLAINT: f/u of  MDS, h/o breast cancer     CURRENT THERAPY:  Azacitadine injection, days 1-5, 8-9 q28d, starting 11/15/21    ASSESSMENT:  Amanda Duarte is a 66 y.o. 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  Her FISH test showed 3 abnormal type, her risk of transforming to acute leukemia is very high, likely in the next 6 to 12 months -She started azacitidine injection on November 15, 2021, she was admitted to hospital after chemo, for severe profound pancytopenia, aspiration pneumonia, required intubation. She has recovered well  -Her blood counts improved after first cycle hemotherapy, likely she responded to treatment. -We again had a long discussion about continue chemotherapy with dose reduction, due to the high risk of infection and complications, referred to [facility] to discuss bone marrow transplant, versus supportive care only.  Despite my repeated explanation with the online interpreter, and her daughters supination and encouragement, she seems to have difficulty to understand complexity of her situation.  After lengthy discussion, patient and her daughter decided to proceed with second cycle chemotherapy after new  year.  He agrees to referral to [facility] transplant team.  Malignant neoplasm of upper-inner quadrant of left breast in female, estrogen receptor positive ([place]) -diagnosed in 04/2017, s/p neoadjuvant ddAC-TC, left mastectomy and adjuvant radiation. She had excellent response. -most recent right mammogram on 07/26/21 and right axilla [name] on 10/27/21 were benign.     PLAN: - Lab reviewed.  Will give 1 unit blood transfusion today. -Lab and blood transfusion as needed weekly -Will start cycle 2 Vidazax4 days on 01/25/22, I will see her on 1/3  -referral to [place] BMT program   SUMMARY OF ONCOLOGIC HISTORY: Oncology History Overview Note  Cancer Staging Malignant neoplasm of upper-inner quadrant of left breast in female, estrogen receptor positive (Amanda Duarte) Staging form: Breast, AJCC 8th Edition - Clinical stage from 05/01/2017: Stage Unknown (cTX, cN2, cM0, G3, ER-, PR-, HER2-) - Signed by [name], MD on 11/01/2017 - Pathologic stage from 11/20/2017: No Stage Recommended (ypT0, pN4m, cM0, GX, ER-, PR-, HER2-) - Signed by [name] on 12/10/2017     Malignant neoplasm of upper-inner quadrant of left breast in female, estrogen receptor positive (Amanda Duarte  04/28/2017 Mammogram   IMPRESSION: Two adjacent masses/enlarged lymph nodes in the LOWER LEFT axilla, the largest measuring 2.1 cm. Tissue sampling of 1 of these is recommended to exclude malignancy/lymphoma. No mammographic evidence of breast malignancy bilaterally.    05/01/2017 Initial Biopsy   Diagnosis 05/01/17 Lymph node, needle/core biopsy, low left inferior axillary - METASTATIC POORLY DIFFERENTIATED CARCINOMA TO A LYMPH NODE. SEE NOTE.   05/01/2017 Cancer Staging   Staging form: Breast, AJCC 8th Edition - Clinical stage from 05/01/2017: Stage Unknown (cTX, cN1, cM0, G3, ER-, PR-, HER2-) - Signed by FTruitt Merle  MD on 06/02/2017    05/01/2017 Receptors her2   Lymph Node Biopsy:  HER2-Negative  PR-Negative  ER- Negative    05/10/2017 Imaging    MR Breast W WO Contrast 05/10/17 IMPRESSION: No MRI evidence of malignancy in the right breast. Area of week stippled non mass enhancement in the left breast upper inner quadrant. Separate area of thin linear non mass enhancement in the subareolar left breast. Three grossly abnormal left axillary lymph nodes, and more than 4 less than 1 cm indeterminate left axillary lymph nodes. No evidence of right axillary lymphadenopathy.   05/17/2017 Initial Biopsy   Diagnosis 05/17/17 1. 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 2. Breast, left, needle core biopsy, upper inner post MR enhancement - MICROSCOPIC FOCUS OF DUCTAL CARCINOMA IN SITU.   05/17/2017 Receptors her2   Left Breast Biopsy:  ER-Negative PR-Negative HER2-Negative   05/22/2017 Initial Diagnosis   Malignant neoplasm of upper-inner quadrant of left breast in female, estrogen receptor positive (Amanda Duarte)   06/01/2017 Imaging   Bo[place] Scan 06/01/17 IMPRESSION: No definite scintigraphic evidence of osseous metastatic disease.   Posttraumatic and postsurgical uptake at the LEFT knee.   Nonspecific soft tissue distribution of tracer at the LEFT thigh, could represent contusion, hemorrhage, soft tissue edema, or soft tissue calcifications such as from heterotopic calcification and myositis ossificans; recommend clinical correlation and consider dedicated LEFT femoral radiographs.   Single focus of nonspecific increased tracer localization at the lateral LEFT orbit.     06/01/2017 Imaging   06/01/2017 Bone Scan IMPRESSION: No definite scintigraphic evidence of osseous metastatic disease.   Posttraumatic and postsurgical uptake at the LEFT knee.   Nonspecific soft tissue distribution of tracer at the LEFT thigh, could represent contusion, hemorrhage, soft tissue edema, or soft tissue calcifications such as from heterotopic calcification and myositis  ossificans; recommend clinical correlation and consider dedicated LEFT femoral radiographs.   Single focus of nonspecific increased tracer localization at the lateral LEFT orbit.   06/09/2017 -  Chemotherapy   ddAC every 2 weeks for 4 weeks starting 06/09/17-07/21/17 followed by weekly [place] and taxol for 12 weeks 08/04/17-10/20/17.    10/24/2017 Imaging   Breast MRI B/l 10/24/17 IMPRESSION: 1. No residual enhancement in the LEFT breast following neoadjuvant treatment. 2. Significantly smaller LEFT axillary lymph nodes, largest now measuring 1.4 centimeters. The remainder of LEFT axillary lymph nodes demonstrate normal fatty hila. RECOMMENDATION: Treatment plan for known LEFT breast cancer.   11/20/2017 Cancer Staging   Staging form: Breast, AJCC 8th Edition - Pathologic stage from 11/20/2017: No Stage Recommended (ypT0, pN46m, cM0, GX, ER-, PR-, HER2-) - Signed by [name] on 12/10/2017   11/20/2017 Surgery   Left mastectomy and SLN biopsy by Dr. IDalbert Batman   11/20/2017 Pathology Results   Breast, modified radical mastectomy , left - MICROSCOPIC FOCI OF RESIDUAL METASTATIC CARCINOMA INVOLVING TWO OF EIGHT LYMPH NODES (2/8); LARGEST CONTINUOUS FOCUS MEASURES 0.1 CM. - NO EVIDENCE OF RESIDUAL CARCINOMA IN THE MASTECTOMY SPECIMEN - MARKED THERAPY-RELATED CHANGES, INCLUDING DENSE HYALIN FIBROSIS     11/20/2017 Receptors her2   ER- PR- HER2- (IHC 1+)   01/02/2018 - 02/14/2018 Radiation Therapy   Adjuvant Radiation 01/02/18 - 02/14/18   07/31/2018 Imaging   Baseline DEXA 07/31/18  ASSESSMENT: The BMD measured at AP Spine L1-L4 is 0.973 g/cm2 with a T-score of -1.7.   This patient is considered OSTEOPENIC according to [place] criteria.  The scan quality is good.   Site Region Measured Date Measured Age YA T-score BMD Significant CHANGE   AP Spine  L1-L4      07/31/2018    63.1         -1.7    0.973 g/cm2   DualFemur Neck Left  07/31/2018    63.1          -1.5    0.828 g/cm2   DualFemur Total Mean 07/31/2018    63.1         0.1     1.016 g/cm2   11/05/2018 Survivorship   Per [name], NP    11/15/2021 -  Chemotherapy   Patient is on Treatment Plan : MYELODYSPLASIA  Azacitidine SQ D1-7 q28d        INTERVAL HISTORY:  Amanda Duarte is here for a follow up of MDS, h/o breast cancer She was last seen by me on 12/24/21 She presents to the clinic accompanied daughter.Pt states she is eating well. Her she walking around good.    All other systems were reviewed with the patient and are negative.  MEDICAL HISTORY:  Past Medical History:  Diagnosis Date   Cancer ([place]) 03/2017   left breast cancer   Diabetes mellitus without complication ([place])    Hyperlipidemia    Hypertension    Malignant neoplasm of left breast ([place])    Stroke (cerebrum) ([place])    Tibial plateau fracture, left    04-13-17 had ORIF    SURGICAL HISTORY: Past Surgical History:  Procedure Laterality Date   MASTECTOMY Left 2019   MASTECTOMY MODIFIED RADICAL Left 11/20/2017   Procedure: LEFT MODIFIED RADICAL MASTECTOMY;  Surgeon: Fanny Skates, MD;  Location: [facility];  Service: General;  Laterality: Left;   ORIF TIBIA PLATEAU Left 04/13/2017   Procedure: OPEN REDUCTION INTERNAL FIXATION (ORIF) TIBIAL PLATEAU;  Surgeon: Shona Needles, MD;  Location: [facility];  Service: Orthopedics;  Laterality: Left;   PORT-A-CATH REMOVAL Right 11/20/2017   Procedure: REMOVAL PORT-A-CATH;  Surgeon: Fanny Skates, MD;  Location: [facility];  Service: General;  Laterality: Right;   PORTACATH PLACEMENT Right 05/31/2017   Procedure: INSERTION PORT-A-CATH;  Surgeon: Fanny Skates, MD;  Location: [facility];  Service: General;  Laterality: Right;    I have reviewed the social history and family history with the patient and they are unchanged from previous note.  ALLERGIES:  is allergic to victoza [liraglutide].  MEDICATIONS:  Current Outpatient Medications  Medication Sig  Dispense Refill   Accu-Chek Softclix Lancets lancets Use to check blood sugar 3 times daily. E11.65 100 each 3   amLODipine (NORVASC) 5 MG tablet Take 1 tablet (5 mg total) by mouth daily. For blood pressure 90 tablet 1   atorvastatin (LIPITOR) 40 MG tablet Take 1 tablet (40 mg total) by mouth daily. For cholesterol 90 tablet 2   Blood Glucose Monitoring Suppl (ACCU-CHEK GUIDE) w/Device KIT use kit to check blood glucose three times daily 1 kit 0   Blood Pressure Monitor DEVI Please provide patient with insurance approved blood pressure monitor 1 each 0   citalopram (CELEXA) 20 MG tablet Take 1 tablet (20 mg total) by mouth daily. For depression 90 tablet 3   Continuous Blood Gluc Receiver (FREESTYLE LIBRE READER) DEVI Monitor blood glucose levels 5-6 times per day E11.65 1 each 0   Continuous Blood Gluc Sensor (FREESTYLE LIBRE 2 SENSOR) MISC Monitor blood glucose levels 5-6 times per day E11.65 3 each 6  fenofibrate (TRICOR) 145 MG tablet Take 1 tablet (145 mg total) by mouth daily. For cholesterol 90 tablet 2   fluticasone (FLONASE) 50 MCG/ACT nasal spray Place 2 sprays into both nostrils daily. 16 g 6   glucose blood (ACCU-CHEK GUIDE) test strip Use to check blood sugar 3 times daily. E11.65 100 each 11   hydrOXYzine (ATARAX) 10 MG tablet Take 1 tablet (10 mg total) by mouth 3 (three) times daily as needed for anxiety. 60 tablet 3   insulin glargine (LANTUS) 100 UNIT/ML Solostar Pen Inject 20 Units into the skin at bedtime. 36 mL 1   insulin lispro (HUMALOG) 100 UNIT/ML cartridge Inject 0.1 mLs (10 Units total) into the skin 3 (three) times daily with meals. 15 mL 11   Insulin Pen Needle 31G X 5 MM MISC Inject 1 Device into the skin QID. For use with insulin pens 200 each 6   Insulin Syringe-Needle U-100 30G X 5/16" 1 ML MISC Use as directed to inject into the skin 3 times daily. 200 each 6   losartan (COZAAR) 50 MG tablet Take 1 tablet (50 mg total) by mouth daily. For blood pressure 90 tablet  1   ondansetron (ZOFRAN) 8 MG tablet Take 1 tablet (8 mg total) by mouth every 8 (eight) hours as needed for nausea or vomiting. (Patient taking differently: Take 8 mg by mouth as needed for nausea or vomiting.) 30 tablet 1   pantoprazole (PROTONIX) 40 MG tablet Take 1 tablet (40 mg total) by mouth daily at 6 (six) AM. For heartburn 90 tablet 1   potassium chloride SA (KLOR-CON M) 20 MEQ tablet Take 1 tablet (20 mEq total) by mouth daily. 30 tablet 3   predniSONE (DELTASONE) 10 MG tablet Take 5 tablets (50 mg total) by mouth daily with breakfast for 4 days, THEN 4 tablets (40 mg total) daily with breakfast for 7 days, THEN 3 tablets (30 mg total) daily with breakfast for 7 days, THEN 2 tablets (20 mg total) daily with breakfast for 7 days, THEN 1 tablet (10 mg total) daily with breakfast for 7 days. 90 tablet 0   prochlorperazine (COMPAZINE) 10 MG tablet Take 1 tablet (10 mg total) by mouth every 6 (six) hours as needed for nausea or vomiting. 30 tablet 1   sulfamethoxazole-trimethoprim (BACTRIM DS) 800-160 MG tablet Take 1 tablet by mouth 3 (three) times a week. 12 tablet 0   No current facility-administered medications for this visit.    PHYSICAL EXAMINATION: ECOG PERFORMANCE STATUS: 2 - Symptomatic, <50% confined to bed  There were no vitals filed for this visit. Wt Readings from Last 3 Encounters:  01/07/22 109 lb 6.4 oz (49.6 kg)  01/04/22 107 lb 12.8 oz (48.9 kg)  01/03/22 109 lb (49.4 kg)     GENERAL:alert, no distress and comfortable SKIN: skin color normal, no rashes or significant lesions EYES: normal, Conjunctiva are pink and non-injected, sclera clear  NEURO: alert & oriented x 3 with fluent speech   LABORATORY DATA:  I have reviewed the data as listed    Latest Ref Rng & Units 01/07/2022   10:13 AM 12/31/2021    1:31 PM 12/28/2021   12:55 PM  CBC  WBC 4.0 - 10.5 K/uL 3.3  2.3  2.4   Hemoglobin 12.0 - 15.0 g/dL 7.6  8.5  7.5   Hematocrit 36.0 - 46.0 % 22.2  24.2  22.2    Platelets 150 - 400 K/uL 64  100  140  Latest Ref Rng & Units 01/07/2022   10:13 AM 12/31/2021    1:31 PM 12/28/2021   12:55 PM  CMP  Glucose 70 - 99 mg/dL 378  245  381   BUN 8 - 23 mg/dL _0 Creatinine 0.44 - 1.00 mg/dL 0.54  0.48  0.47   Sodium 135 - 145 mmol/L 137  136  134   Potassium 3.5 - 5.1 mmol/L 4.0  3.8  4.3   Chloride 98 - 111 mmol/L 101  102  99   CO2 22 - 32 mmol/L _1 Calcium 8.9 - 10.3 mg/dL 9.3  9.2  9.4   Total Protein 6.5 - 8.1 g/dL 5.9  6.2  6.4   Total Bilirubin 0.3 - 1.2 mg/dL 0.6  0.5  0.7   Alkaline Phos 38 - 126 U/L 96  113  103   AST 15 - 41 U/L _2 ALT 0 - 44 U/L _3 RADIOGRAPHIC STUDIES: I have personally reviewed the radiological images as listed and agreed with the findings in the report. No results found.    No orders of the defined types were placed in this encounter.  All questions were answered. The patient knows to call the clinic with any problems, questions or concerns. No barriers to learning was detected. The total time spent in the appointment was 40 minutes.     [name], MD 01/07/2022   Felicity Coyer, CMA, am acting as scribe for [name], MD.   I have reviewed the above documentation for accuracy and completeness, and I agree with the above.

## 2022-01-06 NOTE — Patient Outreach (Signed)
  Care Coordination   Follow Up Visit Note   01/06/2022 Name: Amanda Duarte MRN: [id number] DOB: 1955-09-18  Amanda Duarte is a 66 y.o. year old female who sees [name], NP for primary care. I spoke with  [name] by phone today.  What matters to the patients health and wellness today?  Resource Education    Goals Addressed             This Visit's Progress    COMPLETED: Care Coordination Activities       Care Coordination Interventions: Collaboration with RN Care Manager Glenard Haring [place] who advises the patient needs assistance with transportation  Contacted the patient via [place] "Horton Finer" ID [phone] to complete an SDoH screen Patient denies the need for transportation assistance stating "my daughter takes me" Provided the patient with SW contact encouraging her to contact SW if transportation resources are needed in the future Screened for SDoH needs - patient declines immediate needs but does indicate her appetite has recently increased and she may need assistance with the cost of vegetables Noted patient has an appointment at the cancer center on 12/15 Advised the patient SW would request an in person consult with the SW from the [place] to assist with resource needs as it will benefit the patient to receive these resource in person rather than via mail Collaboration with Bethann Berkshire, Support Team lead with the [place] requesting assistance with linking patient to [place] via the [place] with Barb Merino RN Care Manager advising or interventions and plan         SDOH assessments and interventions completed:  Yes  SDOH Interventions Today    Flowsheet Row Most Recent Value  SDOH Interventions   Food Insecurity Interventions --  [pt indicates her appetite has increased and she may need some help. Referred to cancer center for SW support face to [place] Interventions Intervention Not Indicated   Transportation Interventions Other (Comment)  [dtr takes, provided number for if needed in the future]  Utilities Interventions Intervention Not Indicated        Care Coordination Interventions:  Yes, provided   Follow up plan: Referral made to [place] Work team    Encounter Outcome:  Pt. Visit Completed   Daneen Schick, Arita Miss, CDP Social Worker, Certified Dementia Practitioner [place] Coordination [phone]

## 2022-01-06 NOTE — Patient Instructions (Signed)
Visit Information  Thank you for taking time to visit with me today. Please don't hesitate to contact me if I can be of assistance to you.   Following are the goals we discussed today:   Goals Addressed             This Visit's Progress    COMPLETED: Care Coordination Activities       Care Coordination Interventions: Collaboration with [place] who advises the patient needs assistance with transportation  Contacted the patient via [place] "Horton Finer" ID [phone] to complete an SDoH screen Patient denies the need for transportation assistance stating "my daughter takes me" Provided the patient with SW contact encouraging her to contact SW if transportation resources are needed in the future Screened for SDoH needs - patient declines immediate needs but does indicate her appetite has recently increased and she may need assistance with the cost of vegetables Noted patient has an appointment at the cancer center on 12/15 Advised the patient SW would request an in person consult with the SW from the [place] to assist with resource needs as it will benefit the patient to receive these resource in person rather than via mail Collaboration with Bethann Berkshire, Support Team lead with the [place] requesting assistance with linking patient to [place] services via the [place] with Barb Merino RN Care Manager advising or interventions and plan         If you are experiencing a [place] or [place] or need someone to talk to, please go to [facility] [address] [place] [phone]) call 911  The patient verbalized understanding of instructions, educational materials, and care plan provided today and DECLINED offer to receive copy of patient instructions, educational materials, and care plan.   No further follow up required: Please contact me as needed  Daneen Schick,  BSW, CDP Social Worker, Certified Dementia Practitioner [place] [phone]

## 2022-01-07 ENCOUNTER — Encounter: Payer: Self-pay | Admitting: Hematology

## 2022-01-07 ENCOUNTER — Other Ambulatory Visit: Payer: Self-pay

## 2022-01-07 ENCOUNTER — Inpatient Hospital Stay: Payer: Medicare Other

## 2022-01-07 ENCOUNTER — Inpatient Hospital Stay: Payer: Medicare Other | Admitting: Licensed Clinical Social Worker

## 2022-01-07 ENCOUNTER — Inpatient Hospital Stay (HOSPITAL_BASED_OUTPATIENT_CLINIC_OR_DEPARTMENT_OTHER): Payer: Medicare Other | Admitting: Hematology

## 2022-01-07 ENCOUNTER — Other Ambulatory Visit: Payer: Medicare Other

## 2022-01-07 DIAGNOSIS — C50212 Malignant neoplasm of upper-inner quadrant of left female breast: Secondary | ICD-10-CM

## 2022-01-07 DIAGNOSIS — Z170 Estrogen receptor positive status [ER+]: Secondary | ICD-10-CM

## 2022-01-07 DIAGNOSIS — D469 Myelodysplastic syndrome, unspecified: Secondary | ICD-10-CM | POA: Diagnosis not present

## 2022-01-07 DIAGNOSIS — D696 Thrombocytopenia, unspecified: Secondary | ICD-10-CM

## 2022-01-07 DIAGNOSIS — D46Z Other myelodysplastic syndromes: Secondary | ICD-10-CM

## 2022-01-07 DIAGNOSIS — D649 Anemia, unspecified: Secondary | ICD-10-CM

## 2022-01-07 LAB — CMP (CANCER CENTER ONLY)
ALT: 10 U/L (ref 0–44)
AST: 8 U/L — ABNORMAL LOW (ref 15–41)
Albumin: 3.7 g/dL (ref 3.5–5.0)
Alkaline Phosphatase: 96 U/L (ref 38–126)
Anion gap: 8 (ref 5–15)
BUN: 14 mg/dL (ref 8–23)
CO2: 28 mmol/L (ref 22–32)
Calcium: 9.3 mg/dL (ref 8.9–10.3)
Chloride: 101 mmol/L (ref 98–111)
Creatinine: 0.54 mg/dL (ref 0.44–1.00)
GFR, Estimated: >60 mL/min (ref >60)
Glucose, Bld: 378 mg/dL — ABNORMAL HIGH (ref 70–99)
Potassium: 4 mmol/L (ref 3.5–5.1)
Sodium: 137 mmol/L (ref 135–145)
Total Bilirubin: 0.6 mg/dL (ref 0.3–1.2)
Total Protein: 5.9 g/dL — ABNORMAL LOW (ref 6.5–8.1)

## 2022-01-07 LAB — CBC WITH DIFFERENTIAL (CANCER CENTER ONLY)
Abs Immature Granulocytes: 0 10*3/uL (ref 0.00–0.07)
Basophils Absolute: 0 10*3/uL (ref 0.0–0.1)
Basophils Relative: 0 %
Eosinophils Absolute: 0.2 10*3/uL (ref 0.0–0.5)
Eosinophils Relative: 6 %
HCT: 22.2 % — ABNORMAL LOW (ref 36.0–46.0)
Hemoglobin: 7.6 g/dL — ABNORMAL LOW (ref 12.0–15.0)
Lymphocytes Relative: 65 %
Lymphs Abs: 2.1 10*3/uL (ref 0.7–4.0)
MCH: 29.3 pg (ref 26.0–34.0)
MCHC: 34.2 g/dL (ref 30.0–36.0)
MCV: 85.7 fL (ref 80.0–100.0)
Metamyelocytes Relative: 1 %
Monocytes Absolute: 0.1 10*3/uL (ref 0.1–1.0)
Monocytes Relative: 2 %
Neutro Abs: 0.9 10*3/uL — ABNORMAL LOW (ref 1.7–7.7)
Neutrophils Relative %: 26 %
Platelet Count: 64 10*3/uL — ABNORMAL LOW (ref 150–400)
RBC: 2.59 MIL/uL — ABNORMAL LOW (ref 3.87–5.11)
RDW: 14.1 % (ref 11.5–15.5)
WBC Count: 3.3 10*3/uL — ABNORMAL LOW (ref 4.0–10.5)
nRBC: 1.5 % — ABNORMAL HIGH (ref 0.0–0.2)

## 2022-01-07 LAB — PREPARE RBC (CROSSMATCH)

## 2022-01-07 MED ORDER — DIPHENHYDRAMINE HCL 25 MG PO CAPS
25.0000 mg | ORAL_CAPSULE | Freq: Once | ORAL | Status: AC
  Administered 2022-01-07: 25 mg via ORAL
  Filled 2022-01-07: qty 1

## 2022-01-07 MED ORDER — ACETAMINOPHEN 325 MG PO TABS
650.0000 mg | ORAL_TABLET | Freq: Once | ORAL | Status: AC
  Administered 2022-01-07: 650 mg via ORAL
  Filled 2022-01-07: qty 2

## 2022-01-07 MED ORDER — SODIUM CHLORIDE 0.9% IV SOLUTION
250.0000 mL | Freq: Once | INTRAVENOUS | Status: AC
  Administered 2022-01-07: 250 mL via INTRAVENOUS

## 2022-01-07 NOTE — Assessment & Plan Note (Addendum)
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  Her FISH test showed 3 abnormal type, her risk of transforming to acute leukemia is very high, likely in the next 6 to 12 months -She started azacitidine injection on November 15, 2021, she was admitted to hospital after chemo, for severe profound pancytopenia, aspiration pneumonia, required intubation. She has recovered well  -Her blood counts improved after first cycle hemotherapy, likely she responded to treatment. -We again had a long discussion about continue chemotherapy with dose reduction, due to the high risk of infection and complications, referred to [facility] to discuss bone marrow transplant, versus supportive care only.  Despite my repeated explanation with the online interpreter, and her daughters supination and encouragement, she seems to have difficulty to understand complexity of her situation.  After lengthy discussion, patient and her daughter decided to proceed with second cycle chemotherapy after new year.  He agrees to referral to [facility] transplant team.

## 2022-01-07 NOTE — Progress Notes (Signed)
[place] CSW Progress Note  Holiday representative  met with pt in infusion to inquire about food insecurity.    CSW received a message from pt's PCP social worker that pt had expressed some concern regarding finances and securing food.  CSW utilized a Administrator, sports by phone to communicate w/ pt.  Pt states if she starts treatment she is concerned she may not be able to afford food which will give her the nutrition she will need.  CSW provided pt w/ a list of food banks as well as a bag of food from [facility] pantry.  Pt informed she can come to [facility] to get a bag of food as needed while undergoing treatment.  Pt was diagnosed w/ breast cancer in 2019 for which she is in remission.  Pt unfortunately has now been diagnosed w/ MDS and has not yet decided upon treatment.  CSW explained to pt via interpreter that if she begins chemotherapy she can apply for the [employer] again as it has been more than two years since she last used it.  Pt is still deciding upon treatment and will contact CSW if she decides to pursue chemotherapy.      Henriette Combs, LCSW    Patient is participating in a Managed Medicaid Plan:  Yes

## 2022-01-07 NOTE — Assessment & Plan Note (Signed)
-  diagnosed in 04/2017, s/p neoadjuvant ddAC-TC, left mastectomy and adjuvant radiation. She had excellent response. -most recent right mammogram on 07/26/21 and right axilla [name] on 10/27/21 were benign.

## 2022-01-07 NOTE — Patient Instructions (Signed)
Transfusin de sangre en los adultos, cuidados posteriores Blood Transfusion, Adult, Care After Despus de una transfusin de sangre, es comn presentar lo siguiente: Moretones y dolor en el lugar de la va intravenosa (IV). Dolor de cabeza. Siga estas instrucciones en su casa: El mdico podr darle ms instrucciones. Si tiene problemas, llame al mdico. Cuidados del lugar de la insercin     Siga las instrucciones del mdico en lo que respecta al cuidado del lugar de insercin. Este es el lugar donde se coloc un tubo (catter) intravenoso en la vena. Asegrese de hacer lo siguiente: Lvese las manos con agua y jabn durante al menos 20 segundos antes y despus de cambiarse la venda. Use un desinfectante para manos si no dispone de agua y jabn. Cambie las vendas como se lo haya indicado el mdico. Controle el lugar de insercin todos los das para detectar signos de infeccin. Est atento a los siguientes signos: Dolor, hinchazn o enrojecimiento. Sangrado proveniente del lugar. Calor. Pus o mal olor. Instrucciones generales Use los medicamentos de venta libre y los recetados solamente como se lo haya indicado el mdico. Haga reposo como se lo haya indicado el mdico. Retome sus actividades habituales como se lo haya indicado el mdico. Concurra a todas las visitas de seguimiento. Es posible que deba hacerse anlisis en ciertos momentos para controlar la sangre. Comunquese con un mdico si: Tiene picazn o zonas enrojecidas e hinchadas en la piel (urticaria). Tiene fiebre o escalofros. Tiene dolor de cabeza, en la espalda o el pecho. Est preocupado o nervioso (ansioso). Se siente dbil despus de realizar sus actividades habituales. Tiene alguno de los siguientes problemas en el lugar de la insercin: Enrojecimiento, hinchazn, calor o dolor. Sangrado que no se detiene al ejercer presin. Pus o mal olor. Si recibi la transfusin de sangre en un entorno para pacientes  ambulatorios, le indicarn con quin debe ponerse en contacto para informar cualquier reaccin. Solicite ayuda de inmediato si: Tiene signos de una reaccin grave. Puede deberse a una alergia o provenir del sistema de defensa del cuerpo (sistema inmunitario). Algunos signos son los siguientes: Dificultad para respirar o falta de aire. Hinchazn en la cara o sensacin de calor (sofoco). Una erupcin cutnea generalizada. Pis (orina) de color oscuro o sangre en el pis. Latidos cardacos acelerados. Estos sntomas pueden indicar una emergencia. Solicite ayuda de inmediato. Llame al 911. No espere a ver si los sntomas desaparecen. No conduzca por sus propios medios hasta el hospital. Resumen Es normal tener moretones y dolor en el lugar donde se coloc la va intravenosa (IV). Controle el lugar de insercin todos los das para detectar signos de infeccin. Haga reposo como se lo haya indicado el mdico. Retome sus actividades habituales como se lo haya indicado el mdico. Obtenga ayuda de inmediato si tiene signos de una reaccin grave. Esta informacin no tiene como fin reemplazar el consejo del mdico. Asegrese de hacerle al mdico cualquier pregunta que tenga. Document Revised: 05/06/2021 Document Reviewed: 05/06/2021 Elsevier Patient Education  2023 Elsevier Inc.  

## 2022-01-07 NOTE — Assessment & Plan Note (Signed)
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  Her FISH test showed 3 abnormal type, her risk of transforming to acute leukemia is very high, likely in the next 6 to 12 months -She started azacitidine injection on November 15, 2021, she was admitted to hospital after chemo, for severe profound pancytopenia, aspiration pneumonia, required intubation.  She was eventually discharged from hospital 2 days ago, recovering slowly. -We again discussed the option of continued chemotherapy, referred to [facility] to discuss bone marrow transplant, versus palliative care and supportive care alone, given the high risk of complication from chemotherapy.  She would like to have more time to discuss with her family. -Continue lab twice a week and a blood transfusion for hemoglobin less than 8 and platelet less than 15K

## 2022-01-08 LAB — TYPE AND SCREEN
ABO/RH(D): O POS
Antibody Screen: NEGATIVE
Unit division: 0

## 2022-01-08 LAB — BPAM RBC
Blood Product Expiration Date: 202401122359
ISSUE DATE / TIME: 202312151207
Unit Type and Rh: 5100

## 2022-01-10 ENCOUNTER — Telehealth: Payer: Self-pay | Admitting: Hematology

## 2022-01-10 NOTE — Telephone Encounter (Signed)
Spoke with patient confirming upcoming appointments  

## 2022-01-11 ENCOUNTER — Inpatient Hospital Stay: Payer: Medicare Other

## 2022-01-11 ENCOUNTER — Ambulatory Visit: Payer: Self-pay | Admitting: *Deleted

## 2022-01-11 ENCOUNTER — Ambulatory Visit: Payer: Self-pay

## 2022-01-11 NOTE — Telephone Encounter (Signed)
Call received from [place], [place] with [facility] care coordinator, reporting recent blood glucose elevations in the 300's after starting prednisone dose pack 12/22/21 and will complete 01/16/22. RN reports patient is asymptomatic . RN requesting if sliding scale insulin would be appropriate for patient until prednisone dose pack complete. RN reports she has educated patient's daughter [name] if sliding scale may be ordered and will continue to f/u in January with patient. Patient has misplaced Medicare card and unable to get [employer] [place] 2 sensors from pharmacy until she gets a new card. Patient is checking blood glucose using finger sticks. Please advise and call patient' s daughter [name], on [place]  due to language barrier.        Reason for Disposition  [1] Caller has NON-URGENT medication or insulin pump question AND [2] triager unable to answer question  Answer Assessment - Initial Assessment Questions 1. BLOOD GLUCOSE: "What is your blood glucose level?"      Per Glenard Haring, RN reports blood glucose has been running in the 300's since starting prednisone dose pack 12/22/21 and will not complete until 01/16/22 2. ONSET: "When did you check the blood glucose?"     na 3. USUAL RANGE: "What is your glucose level usually?" (e.g., usual fasting morning value, usual evening value)     na 4. KETONES: "Do you check for ketones (urine or blood test strips)?" If Yes, ask: "What does the test show now?"      na 5. TYPE 1 or 2:  "Do you know what type of diabetes you have?"  (e.g., Type 1, Type 2, Gestational; doesn't know)      na 6. INSULIN: "Do you take insulin?" "What type of insulin(s) do you use? What is the mode of delivery? (syringe, pen; injection or pump)?"      Yes takes insulin  7. DIABETES PILLS: "Do you take any pills for your diabetes?" If Yes, ask: "Have you missed taking any pills recently?"     No  8. OTHER SYMPTOMS: "Do you have any symptoms?" (e.g., fever, frequent urination, difficulty breathing,  dizziness, weakness, vomiting)     asymptomatic 9. PREGNANCY: "Is there any chance you are pregnant?" "When was your last menstrual period?"     na  Protocols used: Diabetes - High Blood Sugar-A-AH

## 2022-01-11 NOTE — Patient Outreach (Addendum)
  Care Coordination   Follow Up Visit Note   01/11/2022 Name: Amanda Duarte MRN: [id number] DOB: 07/11/1955  Amanda Duarte is a 66 y.o. year old female who sees [name], NP for primary care. I spoke with daughter Amanda Duarte by phone today.  What matters to the patients health and wellness today?  Patient will continue to work with her pharmacist to obtain a Libre 2 reader/sensor.    Goals Addressed               This Visit's Progress     Patient Stated     To keep blood sugars stable while taking Prednisone (pt-stated)        Care Coordination Interventions: Provided education to patient about basic DM disease process Reviewed medications with patient and discussed importance of medication adherence Provided patient with written educational materials related to hypo and hyperglycemia and importance of correct treatment Advised patient, providing education and rationale, to check cbg before meals and at bedtime and record, calling PCP for findings outside established parameters Review of patient status, including review of consultants reports, relevant laboratory and other test results, and medications completed Determined patient is experiencing hyperglycemic events later in the day, <300 Discussed patient's Rx for prednisone and determined patient is taking an extended loading dose as prescribed by Oncology Determined patient has an Rx for [employer] and is working with pharmacy to obtain this device Placed outbound call to PCP, spoke with nurse [name], reported hyperglycemic episodes and asked if PCP would like to consider adding a sliding scale regimen Determined nurse [name] will discuss with [name] NP and advise of elevated CBGs while taking Prednisone  Mailed printed educational materials related to Hypo/Hyperglycemic management          Other     COMPLETED: Medication Management        Care Coordination Interventions: Completed successful  outbound call with daughter Amanda Duarte Patient interviewed about adult health maintenance status including  Medication Adherence  Completed medication reconciliation with daughter [name] who assist her mother with her medications, determined patient has all prescribed medications on hand, daughter Cecilio Asper understanding of each medication prescribed including indication, dosage and frequency            SDOH assessments and interventions completed:  No     Care Coordination Interventions:  Yes, provided   Follow up plan: Follow up call scheduled for 02/11/22 '@2'$ :30 PM    Encounter Outcome:  Pt. Visit Completed

## 2022-01-11 NOTE — Patient Instructions (Signed)
Visit Information  Thank you for taking time to visit with me today. Please don't hesitate to contact me if I can be of assistance to you.   Following are the goals we discussed today:   Goals Addressed               This Visit's Progress     Patient Stated     To keep blood sugars stable while taking Prednisone (pt-stated)        Care Coordination Interventions: Provided education to patient about basic DM disease process Reviewed medications with patient and discussed importance of medication adherence Provided patient with written educational materials related to hypo and hyperglycemia and importance of correct treatment Advised patient, providing education and rationale, to check cbg before meals and at bedtime and record, calling PCP for findings outside established parameters Review of patient status, including review of consultants reports, relevant laboratory and other test results, and medications completed Determined patient is experiencing hyperglycemic events later in the day, <300 Discussed patient's Rx for prednisone and determined patient is taking an extended loading dose as prescribed by Oncology Determined patient has an Rx for [employer] and is working with pharmacy to obtain this device Placed outbound call to PCP, spoke with nurse [name], reported hyperglycemic episodes and asked if PCP would like to consider adding a sliding scale regimen Determined nurse [name] will discuss with [name] NP and advise of elevated CBGs while taking Prednisone  Mailed printed educational materials related to Hypo/Hyperglycemic management          Other     COMPLETED: Medication Management        Care Coordination Interventions: Completed successful outbound call with daughter [name] Patient interviewed about adult health maintenance status including  Medication Adherence  Completed medication reconciliation with daughter [name] who assist her mother with  her medications, determined patient has all prescribed medications on hand, daughter Cecilio Asper understanding of each medication prescribed including indication, dosage and frequency            Our next appointment is by telephone on 02/11/22 at 2:30 PM  Please call the care guide team at [phone] if you need to cancel or reschedule your appointment.   If you are experiencing a Mental Health or [place] or need someone to talk to, please call 1-800-273-TALK (toll free, 24 hour hotline)  The patient verbalized understanding of instructions, educational materials, and care plan provided today and agreed to receive a mailed copy of patient instructions, educational materials, and care plan.   Barb Merino, RN, BSN, CCM Care Management Coordinator [facility] Care Management  Direct Phone: [phone]

## 2022-01-12 ENCOUNTER — Ambulatory Visit: Payer: Self-pay | Admitting: *Deleted

## 2022-01-12 NOTE — Telephone Encounter (Signed)
Reason for Disposition  [1] Recent fall AND [2] no injury    Called in by Tammy with [facility]  Answer Assessment - Initial Assessment Questions 1. MECHANISM: "How did the fall happen?"     Fell on Sunday.   Tripped over shoe while walking.   No injuries.  Daughter assisted from floor.   Participated in therapy without any problems.    Tammy with [facility] called in to report a fall.   2. DOMESTIC VIOLENCE AND ELDER ABUSE SCREENING: "Did you fall because someone pushed you or tried to hurt you?" If Yes, ask: "Are you safe now?"     No 3. ONSET: "When did the fall happen?" (e.g., minutes, hours, or days ago)     Sunday 4. LOCATION: "What part of the body hit the ground?" (e.g., back, buttocks, head, hips, knees, hands, head, stomach)     No injuries, bruises or scrapes, cuts.   Daughter assisted her up from the floor. 5. INJURY: "Did you hurt (injure) yourself when you fell?" If Yes, ask: "What did you injure? Tell me more about this?" (e.g., body area; type of injury; pain severity)"     No 6. PAIN: "Is there any pain?" If Yes, ask: "How bad is the pain?" (e.g., Scale 1-10; or mild,  moderate, severe)   - NONE (0): No pain   - MILD (1-3): Doesn't interfere with normal activities    - MODERATE (4-7): Interferes with normal activities or awakens from sleep    - SEVERE (8-10): Excruciating pain, unable to do any normal activities      No 7. SIZE: For cuts, bruises, or swelling, ask: "How large is it?" (e.g., inches or centimeters)      No 8. PREGNANCY: "Is there any chance you are pregnant?" "When was your last menstrual period?"     N/A due to age 33. OTHER SYMPTOMS: "Do you have any other symptoms?" (e.g., dizziness, fever, weakness; new onset or worsening).      Tripped over her shoe while walking.   No symptoms and participated in therapy without any problem. 10. CAUSE: "What do you think caused the fall (or falling)?" (e.g., tripped, dizzy spell)       Tripped over her  shoe while walking  Protocols used: Falls and Falling-A-AH  Chief Complaint: Tammy with [facility] called in to report a fall on Sunday with no injuries.   Tripped over her shoe while walking. Symptoms: None Frequency: Once on Sunday Pertinent Negatives: Patient denies any injuries   Participated in therapy without problems Disposition: '[]'$ ED /'[]'$ Urgent Care (no appt availability in office) / '[]'$ Appointment(In office/virtual)/ '[]'$  [place] [facility]/ '[]'$ Home Care/ '[]'$ Refused Recommended Disposition /'[]'$ [place] [facility]/ '[x]'$  Follow-up with PCP Additional Notes: This information sent to [name], NP at [place].

## 2022-01-13 ENCOUNTER — Other Ambulatory Visit: Payer: Self-pay

## 2022-01-13 ENCOUNTER — Telehealth: Payer: Self-pay | Admitting: *Deleted

## 2022-01-13 ENCOUNTER — Other Ambulatory Visit (INDEPENDENT_AMBULATORY_CARE_PROVIDER_SITE_OTHER): Payer: Self-pay | Admitting: Primary Care

## 2022-01-13 DIAGNOSIS — E1165 Type 2 diabetes mellitus with hyperglycemia: Secondary | ICD-10-CM

## 2022-01-13 MED ORDER — INSULIN GLARGINE 100 UNIT/ML SOLOSTAR PEN: 26.0000 [IU] | PEN_INJECTOR | Freq: Every day | SUBCUTANEOUS | 1 refills | Status: DC

## 2022-01-13 MED ORDER — INSULIN LISPRO (1 UNIT DIAL) 100 UNIT/ML (KWIKPEN): 10.0000 [IU] | PEN_INJECTOR | Freq: Three times a day (TID) | SUBCUTANEOUS | 1 refills | Status: DC

## 2022-01-13 NOTE — Addendum Note (Signed)
Addended by: Daisy Blossom, Annie Main L on: 01/13/2022 11:37 AM   Modules accepted: Orders

## 2022-01-13 NOTE — Telephone Encounter (Signed)
Rx for Humalog- for cartridge and patient does not want that- request change Rx to pen/vial- patient has both types of needles so either would work. Please resend Rx

## 2022-01-13 NOTE — Telephone Encounter (Signed)
Spoke with patient daughter Marchia Meiers. Patients date of birth and name was identified. Daughter is aware of response and will inform mother.

## 2022-01-14 ENCOUNTER — Inpatient Hospital Stay: Payer: Medicare Other

## 2022-01-14 ENCOUNTER — Other Ambulatory Visit: Payer: Self-pay

## 2022-01-14 DIAGNOSIS — Z170 Estrogen receptor positive status [ER+]: Secondary | ICD-10-CM

## 2022-01-14 DIAGNOSIS — D649 Anemia, unspecified: Secondary | ICD-10-CM

## 2022-01-14 DIAGNOSIS — D46Z Other myelodysplastic syndromes: Secondary | ICD-10-CM

## 2022-01-14 DIAGNOSIS — D469 Myelodysplastic syndrome, unspecified: Secondary | ICD-10-CM | POA: Diagnosis not present

## 2022-01-14 LAB — CBC WITH DIFFERENTIAL (CANCER CENTER ONLY)
Abs Immature Granulocytes: 0.2 10*3/uL — ABNORMAL HIGH (ref 0.00–0.07)
Basophils Absolute: 0.1 10*3/uL (ref 0.0–0.1)
Basophils Relative: 2 %
Eosinophils Absolute: 0 10*3/uL (ref 0.0–0.5)
Eosinophils Relative: 0 %
HCT: 27 % — ABNORMAL LOW (ref 36.0–46.0)
Hemoglobin: 9.3 g/dL — ABNORMAL LOW (ref 12.0–15.0)
Immature Granulocytes: 3 %
Lymphocytes Relative: 58 %
Lymphs Abs: 3.7 10*3/uL (ref 0.7–4.0)
MCH: 29.5 pg (ref 26.0–34.0)
MCHC: 34.4 g/dL (ref 30.0–36.0)
MCV: 85.7 fL (ref 80.0–100.0)
Monocytes Absolute: 0.2 10*3/uL (ref 0.1–1.0)
Monocytes Relative: 4 %
Neutro Abs: 2.1 10*3/uL (ref 1.7–7.7)
Neutrophils Relative %: 33 %
Platelet Count: 84 10*3/uL — ABNORMAL LOW (ref 150–400)
RBC: 3.15 MIL/uL — ABNORMAL LOW (ref 3.87–5.11)
RDW: 13.8 % (ref 11.5–15.5)
WBC Count: 6.3 10*3/uL (ref 4.0–10.5)
nRBC: 0.8 % — ABNORMAL HIGH (ref 0.0–0.2)

## 2022-01-14 LAB — CMP (CANCER CENTER ONLY)
ALT: 9 U/L (ref 0–44)
AST: 8 U/L — ABNORMAL LOW (ref 15–41)
Albumin: 4.1 g/dL (ref 3.5–5.0)
Alkaline Phosphatase: 76 U/L (ref 38–126)
Anion gap: 6 (ref 5–15)
BUN: 15 mg/dL (ref 8–23)
CO2: 32 mmol/L (ref 22–32)
Calcium: 9.8 mg/dL (ref 8.9–10.3)
Chloride: 99 mmol/L (ref 98–111)
Creatinine: 0.47 mg/dL (ref 0.44–1.00)
GFR, Estimated: >60 mL/min (ref >60)
Glucose, Bld: 140 mg/dL — ABNORMAL HIGH (ref 70–99)
Potassium: 3.8 mmol/L (ref 3.5–5.1)
Sodium: 137 mmol/L (ref 135–145)
Total Bilirubin: 0.8 mg/dL (ref 0.3–1.2)
Total Protein: 7.6 g/dL (ref 6.5–8.1)

## 2022-01-14 LAB — SAMPLE TO BLOOD BANK

## 2022-01-14 NOTE — Progress Notes (Signed)
Per Burr Medico MD, pt does not need blood products today. Pt informed of lab results with the help of translation services.

## 2022-01-20 ENCOUNTER — Inpatient Hospital Stay: Payer: Medicare Other

## 2022-01-20 ENCOUNTER — Other Ambulatory Visit: Payer: Self-pay

## 2022-01-20 DIAGNOSIS — D469 Myelodysplastic syndrome, unspecified: Secondary | ICD-10-CM | POA: Diagnosis not present

## 2022-01-20 DIAGNOSIS — D649 Anemia, unspecified: Secondary | ICD-10-CM

## 2022-01-20 DIAGNOSIS — C50212 Malignant neoplasm of upper-inner quadrant of left female breast: Secondary | ICD-10-CM

## 2022-01-20 NOTE — Patient Instructions (Addendum)
Trombocitopenia Thrombocytopenia La trombocitopenia es la cantidad baja de plaquetas en la sangre. Las plaquetas son clulas diminutas de Herbalist. Cuando uno sangra, se juntan en el lugar del corte o la lesin para Sales promotion account executive. Esto se llama coagulacin sangunea. Si no tiene suficientes plaquetas, es posible que su sangre tenga problemas para Designer, fashion/clothing. Esto puede causar sangrado y moretones con mucha facilidad. Cules son las causas? Esta afeccin es causada por un bajo nmero de plaquetas en la sangre. Hay tres razones principales para ello: El cuerpo no produce la cantidad suficiente de plaquetas. Las causas de esto pueden ser las siguientes: Enfermedades de la mdula sea. Trastornos que se transmiten de padres a hijos (hereditarios). Ciertos medicamentos o tratamientos para Science writer. Infecciones a causa de ciertos microbios (bacterias o virus). Alcoholismo. Las plaquetas no se Camera operator. Esto puede ser consecuencia de: Tener el bazo ms grande de lo normal. Una afeccin llamada enfermedad de Gaucher. El cuerpo destruye las plaquetas demasiado rpido. Las causas de esto pueden ser las siguientes: Ciertas enfermedades autoinmunitarias. Algunos medicamentos que Automatic Data. Ciertos trastornos de Air cabin crew de Herbalist. Ciertos trastornos de Air cabin crew. Exposicin a sustancias qumicas (txicas) perjudiciales. Embarazo. Cules son los signos o sntomas? Aparicin de moretones con facilidad. Hemorragia por la nariz o la boca. Perodos menstruales abundantes. Sangre en el pis (la orina), en la materia fecal (las heces), o vmitos. Piel de color morado (prpura). Una erupcin con aspecto de puntos de color rojo prpura (petequias). Cmo se trata? El tratamiento depende de la causa. El tratamiento puede incluir: El tratamiento de otra afeccin que est causando el bajo recuento de plaquetas. Medicamentos para ayudar a Systems analyst y  Product/process development scientist que se destruyan. Una reposicin (transfusin) de las plaquetas para Scientist, water quality o Recruitment consultant. [place] para extraer Wellsite geologist. Siga estas instrucciones en su casa: Medicamentos Use los medicamentos de venta libre y los recetados solamente como se lo haya indicado el mdico. No tome aspirina ni antiinflamatorios no esteroideos (AINE), como ibuprofeno. Actividad Evite hacer cosas que puedan lastimarlo o causarle moretones. [place] para prevenir las cadas. No practique deportes de contacto. Pregntele al mdico qu actividades son seguras para usted. Tenga cuidado de no quemarse: Cuando [place] la plancha. Cuando cocina. Tenga cuidado de no cortarse: Cuando se afeita. Cuando [place] tijeras, agujas, cuchillos u otras herramientas. Instrucciones generales  Controle la piel y la parte de adentro de la boca para Hydrographic surveyor moretones o sangre como se lo haya indicado el mdico. Use un brazalete de alerta mdica que diga que tiene un trastorno English as a second language teacher. Verifique que no haya sangre en la orina y las heces. Hgalo como se lo haya indicado el mdico. No beba alcohol. Si bebe, limite la cantidad que bebe. Aljese de sustancias qumicas perjudiciales (txicas). Informe a todos sus mdicos que tiene Personnel officer. Asegrese de decirles tambin a su dentista y a Risk analyst. Informe a su dentista sobre su afeccin antes de la limpieza de dientes. Concurra a Muse. Comunquese con un mdico si: Tiene moretones y no sabe por qu. Aparecen nuevos sntomas. Los sntomas empeoran. Tiene fiebre. Solicite ayuda de inmediato si: Tiene una hemorragia activa en cualquier lugar del cuerpo. Observa sangre en el vmito, la orina o las heces. Tiene una lesin en la cabeza. Siente un dolor de cabeza muy intenso de [place] repentina. Resumen La trombocitopenia es la cantidad baja de plaquetas en la sangre. Las plaquetas se juntan y forman un cogulo. Los  sntomas de esta  afeccin incluyen formacin de moretones con facilidad, sangrado de la boca y la [place], un color rojo prpura en la piel y Ardelia Mems erupcin cutnea. Tenga cuidado de no cortarse ni quemarse. Esta informacin no tiene Marine scientist el consejo del mdico. Asegrese de hacerle al mdico cualquier pregunta que tenga. Document Revised: 07/14/2020 Document Reviewed: 07/14/2020 Elsevier Patient Education  [place].

## 2022-01-21 LAB — BPAM PLATELET PHERESIS
Blood Product Expiration Date: 202312292359
ISSUE DATE / TIME: 202312280926
Unit Type and Rh: 6200

## 2022-01-21 LAB — ACID FAST CULTURE WITH REFLEXED SENSITIVITIES (MYCOBACTERIA): Acid Fast Culture: NEGATIVE

## 2022-01-21 LAB — PREPARE PLATELET PHERESIS: Unit division: 0

## 2022-01-25 ENCOUNTER — Other Ambulatory Visit: Payer: Self-pay

## 2022-01-25 ENCOUNTER — Inpatient Hospital Stay: Payer: Medicare Other

## 2022-01-25 ENCOUNTER — Telehealth: Payer: Self-pay

## 2022-01-25 ENCOUNTER — Inpatient Hospital Stay: Payer: Medicare Other | Attending: Hematology

## 2022-01-25 VITALS — BP 109/42 | HR 83 | Temp 98.3°F | Resp 18 | Wt 113.2 lb

## 2022-01-25 DIAGNOSIS — Z79899 Other long term (current) drug therapy: Secondary | ICD-10-CM | POA: Insufficient documentation

## 2022-01-25 DIAGNOSIS — R509 Fever, unspecified: Secondary | ICD-10-CM | POA: Diagnosis not present

## 2022-01-25 DIAGNOSIS — C50212 Malignant neoplasm of upper-inner quadrant of left female breast: Secondary | ICD-10-CM

## 2022-01-25 DIAGNOSIS — Z170 Estrogen receptor positive status [ER+]: Secondary | ICD-10-CM | POA: Insufficient documentation

## 2022-01-25 DIAGNOSIS — Z5111 Encounter for antineoplastic chemotherapy: Secondary | ICD-10-CM | POA: Insufficient documentation

## 2022-01-25 DIAGNOSIS — E119 Type 2 diabetes mellitus without complications: Secondary | ICD-10-CM | POA: Diagnosis not present

## 2022-01-25 DIAGNOSIS — D649 Anemia, unspecified: Secondary | ICD-10-CM

## 2022-01-25 DIAGNOSIS — D46Z Other myelodysplastic syndromes: Secondary | ICD-10-CM

## 2022-01-25 DIAGNOSIS — D469 Myelodysplastic syndrome, unspecified: Secondary | ICD-10-CM | POA: Diagnosis present

## 2022-01-25 DIAGNOSIS — Z794 Long term (current) use of insulin: Secondary | ICD-10-CM | POA: Diagnosis not present

## 2022-01-25 LAB — CBC WITH DIFFERENTIAL (CANCER CENTER ONLY)
Abs Immature Granulocytes: 0.29 10*3/uL — ABNORMAL HIGH (ref 0.00–0.07)
Basophils Absolute: 0.1 10*3/uL (ref 0.0–0.1)
Basophils Relative: 2 %
Eosinophils Absolute: 0.1 10*3/uL (ref 0.0–0.5)
Eosinophils Relative: 2 %
HCT: 21.8 % — ABNORMAL LOW (ref 36.0–46.0)
Hemoglobin: 7.4 g/dL — ABNORMAL LOW (ref 12.0–15.0)
Immature Granulocytes: 8 %
Lymphocytes Relative: 37 %
Lymphs Abs: 1.4 10*3/uL (ref 0.7–4.0)
MCH: 29 pg (ref 26.0–34.0)
MCHC: 33.9 g/dL (ref 30.0–36.0)
MCV: 85.5 fL (ref 80.0–100.0)
Monocytes Absolute: 0.3 10*3/uL (ref 0.1–1.0)
Monocytes Relative: 8 %
Neutro Abs: 1.7 10*3/uL (ref 1.7–7.7)
Neutrophils Relative %: 43 %
Platelet Count: 98 10*3/uL — ABNORMAL LOW (ref 150–400)
RBC: 2.55 MIL/uL — ABNORMAL LOW (ref 3.87–5.11)
RDW: 13.7 % (ref 11.5–15.5)
WBC Count: 3.8 10*3/uL — ABNORMAL LOW (ref 4.0–10.5)
nRBC: 2.6 % — ABNORMAL HIGH (ref 0.0–0.2)

## 2022-01-25 LAB — BASIC METABOLIC PANEL - CANCER CENTER ONLY
Anion gap: 7 (ref 5–15)
BUN: 15 mg/dL (ref 8–23)
CO2: 24 mmol/L (ref 22–32)
Calcium: 9 mg/dL (ref 8.9–10.3)
Chloride: 95 mmol/L — ABNORMAL LOW (ref 98–111)
Creatinine: 0.55 mg/dL (ref 0.44–1.00)
GFR, Estimated: >60 mL/min (ref >60)
Glucose, Bld: 577 mg/dL (ref 70–99)
Potassium: 4.3 mmol/L (ref 3.5–5.1)
Sodium: 126 mmol/L — ABNORMAL LOW (ref 135–145)

## 2022-01-25 LAB — PREPARE RBC (CROSSMATCH)

## 2022-01-25 LAB — SAMPLE TO BLOOD BANK

## 2022-01-25 MED ORDER — ONDANSETRON HCL 8 MG PO TABS
8.0000 mg | ORAL_TABLET | Freq: Once | ORAL | Status: AC
  Administered 2022-01-25: 8 mg via ORAL
  Filled 2022-01-25: qty 1

## 2022-01-25 MED ORDER — INSULIN ASPART 100 UNIT/ML IJ SOLN
8.0000 [IU] | Freq: Once | INTRAMUSCULAR | Status: AC
  Administered 2022-01-25: 8 [IU] via SUBCUTANEOUS
  Filled 2022-01-25: qty 1

## 2022-01-25 MED ORDER — AZACITIDINE CHEMO SQ INJECTION
60.0000 mg/m2 | Freq: Once | INTRAMUSCULAR | Status: AC
  Administered 2022-01-25: 90 mg via SUBCUTANEOUS
  Filled 2022-01-25: qty 3.6

## 2022-01-25 NOTE — Progress Notes (Signed)
Per [name]- ok to treat today with hemoglobin of 7.4 and platelets of 98. Patient to receive blood in the next couple days.  Patient blood sugar is 577 today- [name] ordered 8 units of novolog and a recheck before she leaves the center. [name] requested that this RN call the patient's daughter- patient stated that was ok with hwer- I called her daughter and gave her allth e information to help her mom with her blood sugars. I also sent home a new calendar.  Patient recheck was 510. [name] was informed. Ok per [name] to discharge home. Patient and family aware and will be monitoring patient.

## 2022-01-25 NOTE — Patient Instructions (Signed)
Instrucciones al darle de alta: Discharge Instructions Gracias por elegir al [facility] de Cncer de [place] para brindarle atencin mdica de oncologa y Music therapist.   Si usted tiene una cita de laboratorio con [place], por favor vaya directamente [place] y regstrese en el rea de Control and instrumentation engineer.   Use ropa cmoda y [place] para tener fcil acceso a las vas del Portacath (acceso venoso de Engineer, site duracin) o la lnea PICC (catter central colocado por va perifrica).   Nos esforzamos por ofrecerle tiempo de calidad con su proveedor. Es posible que tenga que volver a programar su cita si llega tarde (15 minutos o ms).  El llegar tarde le afecta a usted y a otros pacientes cuyas citas son posteriores a Merchandiser, retail.  Adems, si usted falta a tres o ms citas sin avisar a la oficina, puede ser retirado(a) de la clnica a discrecin del proveedor.      Para las solicitudes de renovacin de recetas, pida a su farmacia que se ponga en contacto con nuestra oficina y deje que transcurran 58 horas para que se complete el proceso de las renovaciones.    Hoy usted recibi los siguientes agentes de quimioterapia e/o inmunoterapia: Vidaza      Para ayudar a prevenir las nuseas y los vmitos despus de su tratamiento, le recomendamos que tome su medicamento para las nuseas segn las indicaciones.  LOS SNTOMAS QUE DEBEN COMUNICARSE INMEDIATAMENTE SE INDICAN A CONTINUACIN: *FIEBRE SUPERIOR A 100.4 F (38 C) O MS *ESCALOFROS O SUDORACIN *NUSEAS Y VMITOS QUE NO SE CONTROLAN CON EL MEDICAMENTO PARA LAS NUSEAS *DIFICULTAD INUSUAL PARA RESPIRAR  *MORETONES O HEMORRAGIAS NO HABITUALES *PROBLEMAS URINARIOS (dolor o ardor al Garment/textile technologist o frecuencia para Garment/textile technologist) *PROBLEMAS INTESTINALES (diarrea inusual, estreimiento, dolor cerca del ano) SENSIBILIDAD EN LA BOCA Y EN LA GARGANTA CON O SIN LA PRESENCIA DE LCERAS (dolor de garganta, llagas en la boca o dolor de muelas/dientes) ERUPCIN,  HINCHAZN O DOLORES INUSUALES FLUJO VAGINAL INUSUAL O PICAZN/RASQUIA    Los puntos marcados con un asterisco ( *) indican una posible emergencia y debe hacer un seguimiento tan pronto como le sea posible o vaya al Departamento de Emergencias si se le presenta algn problema.  Por favor, muestre la [place] DE ADVERTENCIA DE Windy Canny DE ADVERTENCIA DE Benay Spice al registrarse en [address] de Emergencias y a la enfermera de triaje.  Si tiene preguntas despus de su visita o necesita cancelar o volver a programar su cita, por favor pngase en contacto con [place]  Dept: [phone] y [place]. Las horas de oficina son de 8:00 a.m. a 4:30 p.m. de lunes a viernes. Por favor, tenga en cuenta que los mensajes de voz que se dejan despus de las 4:00 p.m. posiblemente no se devolvern hasta el siguiente da de [place].  Cerramos los fines de semana y [employer]. En todo momento tiene acceso a una enfermera para preguntas urgentes. Por favor, llame al nmero principal de la clnica Dept: [phone] y [place] instrucciones.   Para cualquier pregunta que no sea de carcter urgente, tambin puede ponerse en contacto con su proveedor [employer]. Ahora ofrecemos visitas electrnicas para cualquier persona mayor de 18 aos que solicite atencin mdica en lnea para los sntomas que no sean urgentes. Para ms detalles vaya a mychart.[email or web address].   Tambin puede bajar la aplicacin de MyChart! Vaya a la tienda de aplicaciones, busque "MyChart", abra la aplicacin, seleccione  [place], e ingrese con su nombre de usuario y la contrasea de Pharmacist, community.  Azacitidine Injection Qu es este medicamento? La AZACITIDINA es un agente quimioteraputico. Este medicamento reduce el crecimiento de clulas cancerosas y puede suprimir el sistema inmunolgico. Se utiliza tambin para el tratamiento de sndrome  mielodisplsticos o algunos tipos de leucemia. Este medicamento puede ser utilizado para otros usos; si tiene alguna pregunta consulte con su proveedor de atencin mdica o con su farmacutico. MARCAS COMUNES: Vidaza Qu le debo informar a mi profesional de la salud antes de tomar este medicamento? Necesitan saber si usted presenta alguno de los siguientes problemas o situaciones: enfermedad renal enfermedad heptica tumores hepticos una reaccin alrgica o inusual a la azacitidina, al manitol, a otros medicamentos, alimentos, colorantes o conservantes si est embarazada o buscando quedar embarazada si est amamantando a un beb Cmo debo utilizar este medicamento? Este medicamento se administra mediante inyeccin por va subcutnea. Lo administra un profesional de la salud calificado en un hospital o en un entorno clnico. Hable con su pediatra para informarse acerca del uso de este medicamento en nios. Puede requerir atencin especial. Sobredosis: Pngase en contacto inmediatamente con un centro toxicolgico o una sala de urgencia si usted cree que haya tomado demasiado medicamento. ATENCIN: [employer] es solo para usted. No comparta este medicamento con nadie. Qu sucede si me olvido de una dosis? Es importante no olvidar ninguna dosis. Informe a su mdico o a su profesional de la salud si no puede asistir a Photographer. Qu puede interactuar con este medicamento? No se han estudiado las interacciones. Suministre a Conservation officer, historic buildings de atencin mdica una lista de todos los [place], hierbas, medicamentos de [place], o suplementos dietticos que [place]. Dgales tambin si fuma, bebe alcohol, o utiliza drogas ilegales. Algunos elementos pueden interactuar con su medicamento. Puede ser que esta lista no menciona todas las posibles interacciones. Informe a su profesional de KB Home	[place] de [employer] productos a base de hierbas, medicamentos de [place] o suplementos nutritivos que  est tomando. Si usted fuma, consume bebidas alcohlicas o si utiliza drogas ilegales, indqueselo tambin a su profesional de KB Home	[place]. Algunas sustancias pueden interactuar con su medicamento. A qu debo estar atento al usar [employer]? Visite a su mdico para que revise su evolucin. Este medicamento podra hacerle sentir un Nurse, mental health. Esto es normal ya que la quimioterapia puede Print production planner tanto a las clulas sanas como a las clulas cancerosas. Si presenta algn efecto secundario, infrmelo. Contine con el tratamiento aun si se siente enfermo, a menos que su mdico le indique que lo suspenda. En algunos casos, podra recibir [employer] para ayudarlo con los efectos secundarios. Siga todas las instrucciones para usarlos. Consulte a su mdico o a su profesional de la salud si tiene fiebre, escalofros o dolor de garganta, o cualquier otro sntoma de resfro o gripe. No se trate usted mismo. Este medicamento reduce la capacidad del cuerpo para combatir infecciones. Trate de no acercarse a personas que estn enfermas. Este medicamento podra aumentar el riesgo de moretones o sangrado. Consulte a su mdico o a su profesional de la salud si observa sangrados inusuales. Usted podra necesitar realizarse [employer] de sangre mientras est usando [place]. No debe quedar embarazada mientras est tomando este medicamento y por 6 meses despus de la ltima dosis. Las mujeres deben informar a su mdico si estn buscando quedar embarazadas o si creen que podran estar embarazadas. Los hombres no deben tener hijos mientras estn recibiendo [employer]  y durante 3 meses despus de la ltima dosis. Existe la posibilidad de efectos secundarios graves en un beb sin nacer. Para obtener ms informacin, hable con su profesional de la salud o su farmacutico. No debe amamantar a un beb mientras est usando este medicamento y por 1 semana despus de la ltima dosis. Este medicamento  puede interferir con la capacidad de tener hijos. Hable con su mdico o su profesional de la salud si est preocupado por su fertilidad. Qu efectos secundarios puedo tener al [employer] este medicamento? Efectos secundarios que debe informar a su mdico o a Barrister's clerk de la salud tan pronto como sea posible: Chief of Staff, como erupcin cutnea, comezn/picazn o urticarias, e hinchazn de la cara, los labios o la lengua recuentos sanguneos bajos: este medicamento podra reducir la cantidad de glbulos blancos, glbulos rojos y plaquetas. Su riesgo de infeccin y sangrado podra ser mayor. signos de infeccin: fiebre o escalofros, tos, dolor de garganta, dolor al orinar signos de disminucin en la cantidad de plaquetas o sangrado: moretones, puntos rojos en la piel, heces de color negro y aspecto alquitranado, sangre en la orina signos de disminucin en la cantidad de glbulos rojos: cansancio o debilidad inusual, Youth worker, aturdimiento signos y sntomas de lesin al rin, tales como dificultad para orinar o cambios en la cantidad de orina signos y sntomas de lesin al hgado, como orina amarilla oscura o [place]; sensacin general de estar enfermo o sntomas gripales; heces claras; prdida de apetito; nuseas; dolor en la regin abdominal superior derecha; cansancio o debilidad inusual; color amarillento de los ojos o la piel Efectos secundarios que generalmente no requieren atencin mdica (infrmelos a su mdico o a su profesional de la salud si persisten o si son molestos): estreimiento diarrea nuseas, vmito dolor o enrojecimiento en TEFL teacher de la inyeccin cansancio o debilidad inusual Puede ser que esta lista no menciona todos los posibles efectos secundarios. Comunquese a su mdico por asesoramiento mdico [employer]. Usted puede informar los efectos secundarios a la FDA por telfono al 1-800-FDA-1088. Dnde debo guardar mi medicina? Este medicamento se  administra en hospitales o clnicas y no necesitar guardarlo en su domicilio. ATENCIN: Este folleto es un resumen. Puede ser que no cubra toda la posible informacin. Si usted tiene preguntas acerca de esta medicina, consulte con su mdico, su farmacutico o su profesional de Technical sales engineer.  2023 Elsevier/Gold Standard (2016-03-24 00:00:00)

## 2022-01-25 NOTE — Telephone Encounter (Signed)
Critical Lab Value:  BG [id number] Contacted [name] via cellphone with the critical lab value

## 2022-01-26 ENCOUNTER — Inpatient Hospital Stay: Payer: Medicare Other

## 2022-01-26 VITALS — BP 99/49 | HR 83 | Temp 98.3°F | Resp 16

## 2022-01-26 DIAGNOSIS — Z5111 Encounter for antineoplastic chemotherapy: Secondary | ICD-10-CM | POA: Diagnosis not present

## 2022-01-26 DIAGNOSIS — Z170 Estrogen receptor positive status [ER+]: Secondary | ICD-10-CM

## 2022-01-26 MED ORDER — ONDANSETRON HCL 8 MG PO TABS
8.0000 mg | ORAL_TABLET | Freq: Once | ORAL | Status: AC
  Administered 2022-01-26: 8 mg via ORAL
  Filled 2022-01-26: qty 1

## 2022-01-26 MED ORDER — AZACITIDINE CHEMO SQ INJECTION
60.0000 mg/m2 | Freq: Once | INTRAMUSCULAR | Status: AC
  Administered 2022-01-26: 90 mg via SUBCUTANEOUS
  Filled 2022-01-26: qty 3.6

## 2022-01-26 NOTE — Patient Instructions (Signed)
Instrucciones al darle de alta: Discharge Instructions Gracias por elegir al [facility] de Cncer de [place] para brindarle atencin mdica de oncologa y Music therapist.   Si usted tiene una cita de laboratorio con [place], por favor vaya directamente [place] y regstrese en el rea de Control and instrumentation engineer.   Use ropa cmoda y [place] para tener fcil acceso a las vas del Portacath (acceso venoso de Engineer, site duracin) o la lnea PICC (catter central colocado por va perifrica).   Nos esforzamos por ofrecerle tiempo de calidad con su proveedor. Es posible que tenga que volver a programar su cita si llega tarde (15 minutos o ms).  El llegar tarde le afecta a usted y a otros pacientes cuyas citas son posteriores a Merchandiser, retail.  Adems, si usted falta a tres o ms citas sin avisar a la oficina, puede ser retirado(a) de la clnica a discrecin del proveedor.      Para las solicitudes de renovacin de recetas, pida a su farmacia que se ponga en contacto con nuestra oficina y deje que transcurran 60 horas para que se complete el proceso de las renovaciones.    Hoy usted recibi los siguientes agentes de quimioterapia e/o inmunoterapia: Vidaza      Para ayudar a prevenir las nuseas y los vmitos despus de su tratamiento, le recomendamos que tome su medicamento para las nuseas segn las indicaciones.  LOS SNTOMAS QUE DEBEN COMUNICARSE INMEDIATAMENTE SE INDICAN A CONTINUACIN: *FIEBRE SUPERIOR A 100.4 F (38 C) O MS *ESCALOFROS O SUDORACIN *NUSEAS Y VMITOS QUE NO SE CONTROLAN CON EL MEDICAMENTO PARA LAS NUSEAS *DIFICULTAD INUSUAL PARA RESPIRAR  *MORETONES O HEMORRAGIAS NO HABITUALES *PROBLEMAS URINARIOS (dolor o ardor al Garment/textile technologist o frecuencia para Garment/textile technologist) *PROBLEMAS INTESTINALES (diarrea inusual, estreimiento, dolor cerca del ano) SENSIBILIDAD EN LA BOCA Y EN LA GARGANTA CON O SIN LA PRESENCIA DE LCERAS (dolor de garganta, llagas en la boca o dolor de muelas/dientes) ERUPCIN,  HINCHAZN O DOLORES INUSUALES FLUJO VAGINAL INUSUAL O PICAZN/RASQUIA    Los puntos marcados con un asterisco ( *) indican una posible emergencia y debe hacer un seguimiento tan pronto como le sea posible o vaya al Departamento de Emergencias si se le presenta algn problema.  Por favor, muestre la [place] DE ADVERTENCIA DE Windy Canny DE ADVERTENCIA DE Benay Spice al registrarse en [address] de Emergencias y a la enfermera de triaje.  Si tiene preguntas despus de su visita o necesita cancelar o volver a programar su cita, por favor pngase en contacto con [place]  Dept: [phone] y [place]. Las horas de oficina son de 8:00 a.m. a 4:30 p.m. de lunes a viernes. Por favor, tenga en cuenta que los mensajes de voz que se dejan despus de las 4:00 p.m. posiblemente no se devolvern hasta el siguiente da de [place].  Cerramos los fines de semana y [employer]. En todo momento tiene acceso a una enfermera para preguntas urgentes. Por favor, llame al nmero principal de la clnica Dept: [phone] y [place] instrucciones.   Para cualquier pregunta que no sea de carcter urgente, tambin puede ponerse en contacto con su proveedor [employer]. Ahora ofrecemos visitas electrnicas para cualquier persona mayor de 18 aos que solicite atencin mdica en lnea para los sntomas que no sean urgentes. Para ms detalles vaya a mychart.[email or web address].   Tambin puede bajar la aplicacin de MyChart! Vaya a la tienda de aplicaciones, busque "MyChart", abra la aplicacin, seleccione  [place], e ingrese con su nombre de usuario y la contrasea de Pharmacist, community.  Azacitidine Injection Qu es este medicamento? La AZACITIDINA es un agente quimioteraputico. Este medicamento reduce el crecimiento de clulas cancerosas y puede suprimir el sistema inmunolgico. Se utiliza tambin para el tratamiento de sndrome  mielodisplsticos o algunos tipos de leucemia. Este medicamento puede ser utilizado para otros usos; si tiene alguna pregunta consulte con su proveedor de atencin mdica o con su farmacutico. MARCAS COMUNES: Vidaza Qu le debo informar a mi profesional de la salud antes de tomar este medicamento? Necesitan saber si usted presenta alguno de los siguientes problemas o situaciones: enfermedad renal enfermedad heptica tumores hepticos una reaccin alrgica o inusual a la azacitidina, al manitol, a otros medicamentos, alimentos, colorantes o conservantes si est embarazada o buscando quedar embarazada si est amamantando a un beb Cmo debo utilizar este medicamento? Este medicamento se administra mediante inyeccin por va subcutnea. Lo administra un profesional de la salud calificado en un hospital o en un entorno clnico. Hable con su pediatra para informarse acerca del uso de este medicamento en nios. Puede requerir atencin especial. Sobredosis: Pngase en contacto inmediatamente con un centro toxicolgico o una sala de urgencia si usted cree que haya tomado demasiado medicamento. ATENCIN: [employer] es solo para usted. No comparta este medicamento con nadie. Qu sucede si me olvido de una dosis? Es importante no olvidar ninguna dosis. Informe a su mdico o a su profesional de la salud si no puede asistir a Photographer. Qu puede interactuar con este medicamento? No se han estudiado las interacciones. Suministre a Conservation officer, historic buildings de atencin mdica una lista de todos los [place], hierbas, medicamentos de [place], o suplementos dietticos que [place]. Dgales tambin si fuma, bebe alcohol, o utiliza drogas ilegales. Algunos elementos pueden interactuar con su medicamento. Puede ser que esta lista no menciona todas las posibles interacciones. Informe a su profesional de [employer]	[place] de [employer] productos a base de hierbas, medicamentos de [place] o suplementos nutritivos que  est tomando. Si usted fuma, consume bebidas alcohlicas o si utiliza drogas ilegales, indqueselo tambin a su profesional de [employer]	[place]. Algunas sustancias pueden interactuar con su medicamento. A qu debo estar atento al usar [employer]? Visite a su mdico para que revise su evolucin. Este medicamento podra hacerle sentir un Nurse, mental health. Esto es normal ya que la quimioterapia puede Print production planner tanto a las clulas sanas como a las clulas cancerosas. Si presenta algn efecto secundario, infrmelo. Contine con el tratamiento aun si se siente enfermo, a menos que su mdico le indique que lo suspenda. En algunos casos, podra recibir [employer] para ayudarlo con los efectos secundarios. Siga todas las instrucciones para usarlos. Consulte a su mdico o a su profesional de la salud si tiene fiebre, escalofros o dolor de garganta, o cualquier otro sntoma de resfro o gripe. No se trate usted mismo. Este medicamento reduce la capacidad del cuerpo para combatir infecciones. Trate de no acercarse a personas que estn enfermas. Este medicamento podra aumentar el riesgo de moretones o sangrado. Consulte a su mdico o a su profesional de la salud si observa sangrados inusuales. Usted podra necesitar realizarse [employer] de sangre mientras est usando [place]. No debe quedar embarazada mientras est tomando este medicamento y por 6 meses despus de la ltima dosis. Las mujeres deben informar a su mdico si estn buscando quedar embarazadas o si creen que podran estar embarazadas. Los hombres no deben tener hijos mientras estn recibiendo [employer]  y durante 3 meses despus de la ltima dosis. Existe la posibilidad de efectos secundarios graves en un beb sin nacer. Para obtener ms informacin, hable con su profesional de la salud o su farmacutico. No debe amamantar a un beb mientras est usando este medicamento y por 1 semana despus de la ltima dosis. Este medicamento  puede interferir con la capacidad de tener hijos. Hable con su mdico o su profesional de la salud si est preocupado por su fertilidad. Qu efectos secundarios puedo tener al [employer] este medicamento? Efectos secundarios que debe informar a su mdico o a Barrister's clerk de la salud tan pronto como sea posible: Chief of Staff, como erupcin cutnea, comezn/picazn o urticarias, e hinchazn de la cara, los labios o la lengua recuentos sanguneos bajos: este medicamento podra reducir la cantidad de glbulos blancos, glbulos rojos y plaquetas. Su riesgo de infeccin y sangrado podra ser mayor. signos de infeccin: fiebre o escalofros, tos, dolor de garganta, dolor al orinar signos de disminucin en la cantidad de plaquetas o sangrado: moretones, puntos rojos en la piel, heces de color negro y aspecto alquitranado, sangre en la orina signos de disminucin en la cantidad de glbulos rojos: cansancio o debilidad inusual, Youth worker, aturdimiento signos y sntomas de lesin al rin, tales como dificultad para orinar o cambios en la cantidad de orina signos y sntomas de lesin al hgado, como orina amarilla oscura o [place]; sensacin general de estar enfermo o sntomas gripales; heces claras; prdida de apetito; nuseas; dolor en la regin abdominal superior derecha; cansancio o debilidad inusual; color amarillento de los ojos o la piel Efectos secundarios que generalmente no requieren atencin mdica (infrmelos a su mdico o a su profesional de la salud si persisten o si son molestos): estreimiento diarrea nuseas, vmito dolor o enrojecimiento en TEFL teacher de la inyeccin cansancio o debilidad inusual Puede ser que esta lista no menciona todos los posibles efectos secundarios. Comunquese a su mdico por asesoramiento mdico [employer]. Usted puede informar los efectos secundarios a la FDA por telfono al 1-800-FDA-1088. Dnde debo guardar mi medicina? Este medicamento se  administra en hospitales o clnicas y no necesitar guardarlo en su domicilio. ATENCIN: Este folleto es un resumen. Puede ser que no cubra toda la posible informacin. Si usted tiene preguntas acerca de esta medicina, consulte con su mdico, su farmacutico o su profesional de Technical sales engineer.  2023 Elsevier/Gold Standard (2016-03-24 00:00:00)

## 2022-01-26 NOTE — Progress Notes (Signed)
Per [name], [place] to treat with blood pressure of 98/38. Dorien Chihuahua, RN

## 2022-01-26 NOTE — Assessment & Plan Note (Signed)
-  diagnosed in 04/2017, s/p neoadjuvant ddAC-TC, left mastectomy and adjuvant radiation. She had excellent response. -most recent right mammogram on 07/26/21 and right axilla [name] on 10/27/21 were benign.

## 2022-01-26 NOTE — Assessment & Plan Note (Signed)
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  Her FISH test showed 3 abnormal type, her risk of transforming to acute leukemia is very high, likely in the next 6 to 12 months -She started azacitidine injection on November 15, 2021, she was admitted to hospital after chemo, for severe profound pancytopenia, aspiration pneumonia, required intubation. She has recovered well  -Her blood counts improved after first cycle hemotherapy, likely she responded to treatment. -she started cycle 2 azacitidine this week, will reduce to 5 days

## 2022-01-27 ENCOUNTER — Inpatient Hospital Stay (HOSPITAL_BASED_OUTPATIENT_CLINIC_OR_DEPARTMENT_OTHER): Payer: Medicare Other | Admitting: Hematology

## 2022-01-27 ENCOUNTER — Inpatient Hospital Stay: Payer: Medicare Other

## 2022-01-27 ENCOUNTER — Encounter: Payer: Self-pay | Admitting: Hematology

## 2022-01-27 VITALS — BP 113/40 | HR 88 | Temp 98.4°F | Resp 14 | Ht 60.0 in | Wt 112.3 lb

## 2022-01-27 DIAGNOSIS — Z5111 Encounter for antineoplastic chemotherapy: Secondary | ICD-10-CM | POA: Diagnosis not present

## 2022-01-27 DIAGNOSIS — D46Z Other myelodysplastic syndromes: Secondary | ICD-10-CM

## 2022-01-27 DIAGNOSIS — Z170 Estrogen receptor positive status [ER+]: Secondary | ICD-10-CM

## 2022-01-27 DIAGNOSIS — C50212 Malignant neoplasm of upper-inner quadrant of left female breast: Secondary | ICD-10-CM

## 2022-01-27 MED ORDER — AZACITIDINE CHEMO SQ INJECTION
60.0000 mg/m2 | Freq: Once | INTRAMUSCULAR | Status: AC
  Administered 2022-01-27: 90 mg via SUBCUTANEOUS
  Filled 2022-01-27: qty 3.6

## 2022-01-27 MED ORDER — ONDANSETRON HCL 8 MG PO TABS
8.0000 mg | ORAL_TABLET | Freq: Once | ORAL | Status: AC
  Administered 2022-01-27: 8 mg via ORAL
  Filled 2022-01-27: qty 1

## 2022-01-27 NOTE — Progress Notes (Signed)
[place]   [phone] Fax:([phone]) [phone]   Clinic Follow up Note   Patient Care Team: [name], NP as PCP - General (Nurse Practitioner) [name], MD as Consulting Physician (Hematology) Fanny Skates, MD as Consulting Physician (General Surgery) Alla Feeling, NP as Nurse Practitioner (Nurse Practitioner) Gery Pray, MD as Consulting Physician (Radiation Oncology) Art Buff, MD as Referring Physician (Hematology and Oncology) Lynne Logan, RN as [facility]  Date of Service:  01/27/2022  CHIEF COMPLAINT: f/u of MDS, h/o breast cancer     CURRENT THERAPY:  Azacitadine injection, days 1-5, 8-9 q28d, starting 11/15/21    ASSESSMENT:  Amanda Duarte is a 67 y.o. 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  Her FISH test showed 3 abnormal type, her risk of transforming to acute leukemia is very high, likely in the next 6 to 12 months -She started azacitidine injection on November 15, 2021, she was admitted to hospital after chemo, for severe profound pancytopenia, aspiration pneumonia, required intubation. She has recovered well  -Her blood counts improved after first cycle hemotherapy, likely she responded to treatment. -she started cycle 2 azacitidine this week, will reduce to 4 days for this cycle. If she tolerates well, will change to 5 days from next ccyle   Malignant neoplasm of upper-inner quadrant of left breast in female, estrogen receptor positive ([place]) -diagnosed in 04/2017, s/p neoadjuvant ddAC-TC, left mastectomy and adjuvant radiation. She had excellent response. -most recent right mammogram on 07/26/21 and right axilla [name] on 10/27/21 were benign.    PLAN: -lab reviewed Hg low -Pt agreed to go to  [facility], will refer  -continue C2 Vidaza this week  -lab weekly -f/u in 2 weeks    SUMMARY OF ONCOLOGIC HISTORY: Oncology History Overview Note  Cancer Staging Malignant neoplasm of upper-inner quadrant of left breast in female, estrogen receptor positive ([place]) Staging form: Breast, AJCC 8th Edition - Clinical stage from 05/01/2017: Stage Unknown (cTX, cN2, cM0, G3, ER-, PR-, HER2-) - Signed by [name], MD on 11/01/2017 - Pathologic stage from 11/20/2017: No Stage Recommended (ypT0, pN12m, cM0, GX, ER-, PR-, HER2-) - Signed by [name] on 12/10/2017     Malignant neoplasm of upper-inner quadrant of left breast in female, estrogen receptor positive ([place]  04/28/2017 Mammogram   IMPRESSION: Two adjacent masses/enlarged lymph nodes in the LOWER LEFT axilla, the largest measuring 2.1 cm. Tissue sampling of 1 of these is recommended to exclude malignancy/lymphoma. No mammographic evidence of breast malignancy bilaterally.    05/01/2017 Initial Biopsy   Diagnosis 05/01/17 Lymph node, needle/core biopsy, low left inferior axillary - METASTATIC POORLY DIFFERENTIATED CARCINOMA TO A LYMPH NODE. SEE NOTE.   05/01/2017 Cancer Staging   Staging form: Breast, AJCC 8th Edition - Clinical stage from 05/01/2017: Stage Unknown (cTX, cN1, cM0, G3, ER-, PR-, HER2-) - Signed by [name] on 06/02/2017    05/01/2017 Receptors her2   Lymph Node Biopsy:  HER2-Negative  PR-Negative  ER- Negative    05/10/2017 Imaging   MR Breast W WO Contrast 05/10/17 IMPRESSION: No MRI evidence of malignancy in the right breast. Area of week stippled non mass enhancement in the left breast upper inner quadrant. Separate area of thin linear non mass enhancement in the  subareolar left breast. Three grossly abnormal left axillary lymph nodes, and more than 4 less than 1 cm indeterminate left axillary lymph nodes. No evidence of right axillary lymphadenopathy.   05/17/2017 Initial Biopsy   Diagnosis  05/17/17 1. 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 2. Breast, left, needle core biopsy, upper inner post MR enhancement - MICROSCOPIC FOCUS OF DUCTAL CARCINOMA IN SITU.   05/17/2017 Receptors her2   Left Breast Biopsy:  ER-Negative PR-Negative HER2-Negative   05/22/2017 Initial Diagnosis   Malignant neoplasm of upper-inner quadrant of left breast in female, estrogen receptor positive ([place])   06/01/2017 Imaging   [place] Scan 06/01/17 IMPRESSION: No definite scintigraphic evidence of osseous metastatic disease.   Posttraumatic and postsurgical uptake at the LEFT knee.   Nonspecific soft tissue distribution of tracer at the LEFT thigh, could represent contusion, hemorrhage, soft tissue edema, or soft tissue calcifications such as from heterotopic calcification and myositis ossificans; recommend clinical correlation and consider dedicated LEFT femoral radiographs.   Single focus of nonspecific increased tracer localization at the lateral LEFT orbit.     06/01/2017 Imaging   06/01/2017 Bone Scan IMPRESSION: No definite scintigraphic evidence of osseous metastatic disease.   Posttraumatic and postsurgical uptake at the LEFT knee.   Nonspecific soft tissue distribution of tracer at the LEFT thigh, could represent contusion, hemorrhage, soft tissue edema, or soft tissue calcifications such as from heterotopic calcification and myositis ossificans; recommend clinical correlation and consider dedicated LEFT femoral radiographs.   Single focus of nonspecific increased tracer localization at the lateral LEFT orbit.   06/09/2017 -  Chemotherapy   ddAC every 2 weeks for 4 weeks starting 06/09/17-07/21/17 followed by weekly [place] and taxol for 12 weeks 08/04/17-10/20/17.    10/24/2017 Imaging   Breast MRI B/l 10/24/17 IMPRESSION: 1. No residual enhancement in the LEFT breast following  neoadjuvant treatment. 2. Significantly smaller LEFT axillary lymph nodes, largest now measuring 1.4 centimeters. The remainder of LEFT axillary lymph nodes demonstrate normal fatty hila. RECOMMENDATION: Treatment plan for known LEFT breast cancer.   11/20/2017 Cancer Staging   Staging form: Breast, AJCC 8th Edition - Pathologic stage from 11/20/2017: No Stage Recommended (ypT0, pN56m, cM0, GX, ER-, PR-, HER2-) - Signed by [name] on 12/10/2017   11/20/2017 Surgery   Left mastectomy and SLN biopsy by Dr. IDalbert Batman   11/20/2017 Pathology Results   Breast, modified radical mastectomy , left - MICROSCOPIC FOCI OF RESIDUAL METASTATIC CARCINOMA INVOLVING TWO OF EIGHT LYMPH NODES (2/8); LARGEST CONTINUOUS FOCUS MEASURES 0.1 CM. - NO EVIDENCE OF RESIDUAL CARCINOMA IN THE MASTECTOMY SPECIMEN - MARKED THERAPY-RELATED CHANGES, INCLUDING DENSE HYALIN FIBROSIS     11/20/2017 Receptors her2   ER- PR- HER2- (IHC 1+)   01/02/2018 - 02/14/2018 Radiation Therapy   Adjuvant Radiation 01/02/18 - 02/14/18   07/31/2018 Imaging   Baseline DEXA 07/31/18  ASSESSMENT: The BMD measured at AP Spine L1-L4 is 0.973 g/cm2 with a T-score of -1.7.   This patient is considered OSTEOPENIC according to [facility] criteria. The scan quality is good.   Site Region Measured Date Measured Age YA T-score BMD Significant CHANGE   AP Spine  L1-L4      07/31/2018    63.1         -1.7    0.973 g/cm2   DualFemur Neck Left  07/31/2018    63.1         -1.5  0.828 g/cm2   DualFemur Total Mean 07/31/2018    63.1         0.1     1.016 g/cm2   11/05/2018 Survivorship   Per [name], NP    11/15/2021 -  Chemotherapy   Patient is on Treatment Plan : MYELODYSPLASIA  Azacitidine SQ D1-7 q28d        INTERVAL HISTORY:  Amanda Duarte is here for a follow up of MDS, h/o breast cancer   She was last seen by me on 01/07/22 She presents to the clinic accompanied by family member. Pt states she  able to walk and cre for herself. She takes walks.Pt denies having any bleeding.  All other systems were reviewed with the patient and are negative.  MEDICAL HISTORY:  Past Medical History:  Diagnosis Date   Cancer ([place]) 03/2017   left breast cancer   Diabetes mellitus without complication ([place])    Hyperlipidemia    Hypertension    Malignant neoplasm of left breast ([place])    Stroke (cerebrum) ([place])    Tibial plateau fracture, left    04-13-17 had ORIF    SURGICAL HISTORY: Past Surgical History:  Procedure Laterality Date   MASTECTOMY Left 2019   MASTECTOMY MODIFIED RADICAL Left 11/20/2017   Procedure: LEFT MODIFIED RADICAL MASTECTOMY;  Surgeon: Fanny Skates, MD;  Location: [place];  Service: General;  Laterality: Left;   ORIF TIBIA PLATEAU Left 04/13/2017   Procedure: OPEN REDUCTION INTERNAL FIXATION (ORIF) TIBIAL PLATEAU;  Surgeon: Shona Needles, MD;  Location: [place];  Service: Orthopedics;  Laterality: Left;   PORT-A-CATH REMOVAL Right 11/20/2017   Procedure: REMOVAL PORT-A-CATH;  Surgeon: Fanny Skates, MD;  Location: [place];  Service: General;  Laterality: Right;   PORTACATH PLACEMENT Right 05/31/2017   Procedure: INSERTION PORT-A-CATH;  Surgeon: Fanny Skates, MD;  Location: [place];  Service: General;  Laterality: Right;    I have reviewed the social history and family history with the patient and they are unchanged from previous note.  ALLERGIES:  is allergic to victoza [liraglutide].  MEDICATIONS:  Current Outpatient Medications  Medication Sig Dispense Refill   insulin lispro (HUMALOG KWIKPEN) 100 UNIT/ML KwikPen Inject 10 Units into the skin 3 (three) times daily. 15 mL 1   Accu-Chek Softclix Lancets lancets Use to check blood sugar 3 times daily. E11.65 100 each 3   amLODipine (NORVASC) 5 MG tablet Take 1 tablet (5 mg total) by mouth daily. For blood pressure 90 tablet 1   atorvastatin (LIPITOR) 40 MG tablet Take 1 tablet (40 mg total) by mouth  daily. For cholesterol 90 tablet 2   Blood Glucose Monitoring Suppl (ACCU-CHEK GUIDE) w/Device KIT use kit to check blood glucose three times daily 1 kit 0   Blood Pressure Monitor DEVI Please provide patient with insurance approved blood pressure monitor 1 each 0   citalopram (CELEXA) 20 MG tablet Take 1 tablet (20 mg total) by mouth daily. For depression (Patient not taking: Reported on 01/11/2022) 90 tablet 3   Continuous Blood Gluc Receiver (FREESTYLE LIBRE READER) DEVI Monitor blood glucose levels 5-6 times per day E11.65 (Patient not taking: Reported on 01/11/2022) 1 each 0   Continuous Blood Gluc Sensor (FREESTYLE LIBRE 2 SENSOR) MISC Monitor blood glucose levels 5-6 times per day E11.65 (Patient not taking: Reported on 01/11/2022) 3 each 6   fenofibrate (TRICOR) 145 MG tablet Take 1 tablet (145 mg total) by mouth daily. For cholesterol 90 tablet 2   fluticasone (  FLONASE) 50 MCG/ACT nasal spray Place 2 sprays into both nostrils daily. 16 g 6   glucose blood (ACCU-CHEK GUIDE) test strip Use to check blood sugar 3 times daily. E11.65 100 each 11   hydrOXYzine (ATARAX) 10 MG tablet Take 1 tablet (10 mg total) by mouth 3 (three) times daily as needed for anxiety. 60 tablet 3   insulin glargine (LANTUS) 100 UNIT/ML Solostar Pen Inject 26 Units into the skin at bedtime. 36 mL 1   Insulin Pen Needle 31G X 5 MM MISC Inject 1 Device into the skin QID. For use with insulin pens 200 each 6   Insulin Syringe-Needle U-100 30G X 5/16" 1 ML MISC Use as directed to inject into the skin 3 times daily. 200 each 6   losartan (COZAAR) 50 MG tablet Take 1 tablet (50 mg total) by mouth daily. For blood pressure 90 tablet 1   ondansetron (ZOFRAN) 8 MG tablet Take 1 tablet (8 mg total) by mouth every 8 (eight) hours as needed for nausea or vomiting. (Patient taking differently: Take 8 mg by mouth as needed for nausea or vomiting.) 30 tablet 1   pantoprazole (PROTONIX) 40 MG tablet Take 1 tablet (40 mg total) by mouth  daily at 6 (six) AM. For heartburn 90 tablet 1   potassium chloride SA (KLOR-CON M) 20 MEQ tablet Take 1 tablet (20 mEq total) by mouth daily. 30 tablet 3   prochlorperazine (COMPAZINE) 10 MG tablet Take 1 tablet (10 mg total) by mouth every 6 (six) hours as needed for nausea or vomiting. 30 tablet 1   No current facility-administered medications for this visit.    PHYSICAL EXAMINATION: ECOG PERFORMANCE STATUS: 1 - Symptomatic but completely ambulatory  Vitals:   01/27/22 1446  BP: (!) 113/40  Pulse: 88  Resp: 14  Temp: 98.4 F (36.9 C)  SpO2: 100%   Wt Readings from Last 3 Encounters:  01/27/22 112 lb 4.8 oz (50.9 kg)  01/25/22 113 lb 4 oz (51.4 kg)  01/07/22 109 lb 6.4 oz (49.6 kg)     GENERAL:alert, no distress and comfortable SKIN: skin color normal, no rashes or significant lesions EYES: normal, Conjunctiva are pink and non-injected, sclera clear  NEURO: alert & oriented x 3 with fluent speech  LABORATORY DATA:  I have reviewed the data as listed    Latest Ref Rng & Units 01/25/2022    3:29 PM 01/14/2022    8:42 AM 01/07/2022   10:13 AM  CBC  WBC 4.0 - 10.5 K/uL 3.8  6.3  3.3   Hemoglobin 12.0 - 15.0 g/dL 7.4  9.3  7.6   Hematocrit 36.0 - 46.0 % 21.8  27.0  22.2   Platelets 150 - 400 K/uL 98  84  64         Latest Ref Rng & Units 01/25/2022    3:29 PM 01/14/2022    8:42 AM 01/07/2022   10:13 AM  CMP  Glucose 70 - 99 mg/dL 577  140  378   BUN 8 - 23 mg/dL _0 Creatinine 0.44 - 1.00 mg/dL 0.55  0.47  0.54   Sodium 135 - 145 mmol/L 126  137  137   Potassium 3.5 - 5.1 mmol/L 4.3  3.8  4.0   Chloride 98 - 111 mmol/L 95  99  101   CO2 22 - 32 mmol/L 24  32  28   Calcium 8.9 - 10.3 mg/dL 9.0  9.8  9.3  Total Protein 6.5 - 8.1 g/dL  7.6  5.9   Total Bilirubin 0.3 - 1.2 mg/dL  0.8  0.6   Alkaline Phos 38 - 126 U/L  76  96   AST 15 - 41 U/L  8  8   ALT 0 - 44 U/L  9  10       RADIOGRAPHIC STUDIES: I have personally reviewed the radiological images  as listed and agreed with the findings in the report. No results found.    No orders of the defined types were placed in this encounter.  All questions were answered. The patient knows to call the clinic with any problems, questions or concerns. No barriers to learning was detected. The total time spent in the appointment was 30 minutes.     [name], MD 01/27/2022   Felicity Coyer, CMA, am acting as scribe for [name], MD.   I have reviewed the above documentation for accuracy and completeness, and I agree with the above.

## 2022-01-28 ENCOUNTER — Ambulatory Visit: Payer: Medicare Other | Admitting: Nurse Practitioner

## 2022-01-28 ENCOUNTER — Inpatient Hospital Stay: Payer: Medicare Other

## 2022-01-28 ENCOUNTER — Other Ambulatory Visit: Payer: Self-pay

## 2022-01-28 VITALS — BP 110/49 | HR 76 | Temp 98.5°F | Resp 18

## 2022-01-28 DIAGNOSIS — D649 Anemia, unspecified: Secondary | ICD-10-CM

## 2022-01-28 DIAGNOSIS — D46Z Other myelodysplastic syndromes: Secondary | ICD-10-CM

## 2022-01-28 DIAGNOSIS — C50212 Malignant neoplasm of upper-inner quadrant of left female breast: Secondary | ICD-10-CM

## 2022-01-28 DIAGNOSIS — Z5111 Encounter for antineoplastic chemotherapy: Secondary | ICD-10-CM | POA: Diagnosis not present

## 2022-01-28 LAB — CBC WITH DIFFERENTIAL (CANCER CENTER ONLY)
Abs Immature Granulocytes: 0 10*3/uL (ref 0.00–0.07)
Basophils Absolute: 0.1 10*3/uL (ref 0.0–0.1)
Basophils Relative: 2 %
Eosinophils Absolute: 0.1 10*3/uL (ref 0.0–0.5)
Eosinophils Relative: 1 %
HCT: 21.2 % — ABNORMAL LOW (ref 36.0–46.0)
Hemoglobin: 7.3 g/dL — ABNORMAL LOW (ref 12.0–15.0)
Immature Granulocytes: 0 %
Lymphocytes Relative: 49 %
Lymphs Abs: 2.4 10*3/uL (ref 0.7–4.0)
MCH: 29.3 pg (ref 26.0–34.0)
MCHC: 34.4 g/dL (ref 30.0–36.0)
MCV: 85.1 fL (ref 80.0–100.0)
Monocytes Absolute: 0.2 10*3/uL (ref 0.1–1.0)
Monocytes Relative: 3 %
Neutro Abs: 2.3 10*3/uL (ref 1.7–7.7)
Neutrophils Relative %: 45 %
Platelet Count: 137 10*3/uL — ABNORMAL LOW (ref 150–400)
RBC: 2.49 MIL/uL — ABNORMAL LOW (ref 3.87–5.11)
RDW: 13.7 % (ref 11.5–15.5)
WBC Count: 5.1 10*3/uL (ref 4.0–10.5)
nRBC: 1.2 % — ABNORMAL HIGH (ref 0.0–0.2)

## 2022-01-28 LAB — CMP (CANCER CENTER ONLY)
ALT: 8 U/L (ref 0–44)
AST: 8 U/L — ABNORMAL LOW (ref 15–41)
Albumin: 3.7 g/dL (ref 3.5–5.0)
Alkaline Phosphatase: 72 U/L (ref 38–126)
Anion gap: 6 (ref 5–15)
BUN: 21 mg/dL (ref 8–23)
CO2: 28 mmol/L (ref 22–32)
Calcium: 9.4 mg/dL (ref 8.9–10.3)
Chloride: 100 mmol/L (ref 98–111)
Creatinine: 0.64 mg/dL (ref 0.44–1.00)
GFR, Estimated: >60 mL/min (ref >60)
Glucose, Bld: 380 mg/dL — ABNORMAL HIGH (ref 70–99)
Potassium: 4.1 mmol/L (ref 3.5–5.1)
Sodium: 134 mmol/L — ABNORMAL LOW (ref 135–145)
Total Bilirubin: 0.6 mg/dL (ref 0.3–1.2)
Total Protein: 6.7 g/dL (ref 6.5–8.1)

## 2022-01-28 LAB — SAMPLE TO BLOOD BANK

## 2022-01-28 MED ORDER — DIPHENHYDRAMINE HCL 25 MG PO CAPS
25.0000 mg | ORAL_CAPSULE | Freq: Once | ORAL | Status: AC
  Administered 2022-01-28: 25 mg via ORAL
  Filled 2022-01-28: qty 1

## 2022-01-28 MED ORDER — SODIUM CHLORIDE 0.9 % IV SOLN: INTRAVENOUS | Status: DC

## 2022-01-28 MED ORDER — SODIUM CHLORIDE 0.9% IV SOLUTION
250.0000 mL | Freq: Once | INTRAVENOUS | Status: AC
  Administered 2022-01-28: 250 mL via INTRAVENOUS

## 2022-01-28 MED ORDER — ACETAMINOPHEN 325 MG PO TABS
650.0000 mg | ORAL_TABLET | Freq: Once | ORAL | Status: AC
  Administered 2022-01-28: 650 mg via ORAL
  Filled 2022-01-28: qty 2

## 2022-01-28 MED ORDER — AZACITIDINE CHEMO SQ INJECTION
60.0000 mg/m2 | Freq: Once | INTRAMUSCULAR | Status: AC
  Administered 2022-01-28: 90 mg via SUBCUTANEOUS
  Filled 2022-01-28: qty 3.6

## 2022-01-28 MED ORDER — ONDANSETRON HCL 8 MG PO TABS
8.0000 mg | ORAL_TABLET | Freq: Once | ORAL | Status: AC
  Administered 2022-01-28: 8 mg via ORAL
  Filled 2022-01-28: qty 1

## 2022-01-28 NOTE — Patient Instructions (Addendum)
Transfusin de sangre en los adultos, cuidados posteriores Blood Transfusion, Adult, Care After La siguiente informacin ofrece orientacin sobre cmo cuidarse despus del procedimiento. El mdico tambin podr darle instrucciones ms especficas. Comunquese con el mdico si tiene problemas o preguntas. Qu puedo esperar despus del procedimiento? Despus del procedimiento, es normal tener los siguientes sntomas: Dolor y moretones en el lugar donde se coloc la va intravenosa (IV). Dolor de cabeza. Siga estas instrucciones en su casa: Cuidado del lugar de la insercin de la va intravenosa (IV)     Siga las instrucciones del mdico acerca del cuidado del lugar de la insercin de la va intravenosa (IV). Asegrese de hacer lo siguiente: Lvese las manos con agua y jabn durante al menos 20 segundos antes y despus de cambiarse la venda (vendaje). Use desinfectante para manos si no dispone de agua y jabn. Cambie los vendajes como se lo haya indicado el mdico. Controle el lugar de insercin de la va intravenosa (IV) todos los das para descartar signos de infeccin. Est atento a los siguientes signos: Dolor, hinchazn o enrojecimiento. Sangrado proveniente del lugar. Calor. Pus o mal olor. Instrucciones generales Use los medicamentos de venta libre y los recetados solamente como se lo haya indicado el mdico. Haga reposo como se lo haya indicado el mdico. Retome sus actividades normales como se lo haya indicado el mdico. Concurra a todas las visitas de seguimiento. Es posible que sea necesario realizar anlisis de laboratorio en ciertos perodos para volver a controlar sus recuentos sanguneos. Comunquese con un mdico si: Tiene picazn o zonas enrojecidas e hinchadas en la piel (urticaria). Tiene fiebre o escalofros. Tiene dolor de cabeza, en la espalda o el pecho. Se siente ansioso o dbil despus de realizar sus actividades habituales. Tiene enrojecimiento, hinchazn, calor  o dolor alrededor del lugar de la insercin de la va intravenosa. Observa sangre que sale del lugar de la insercin de la va intravenosa que no se detiene al ejercer presin. Tiene pus o percibe mal olor que proviene del lugar de la insercin de la va intravenosa. Si recibi la transfusin de sangre en un entorno para pacientes ambulatorios, le indicarn con quin debe ponerse en contacto para informar cualquier reaccin. Solicite ayuda de inmediato si: Tiene signos de una reaccin alrgica grave o de una reaccin del sistema inmunitario, por ejemplo: Dificultad para respirar o falta de aire. Hinchazn de la cara, sensacin de sofoco o erupcin cutnea generalizada. Orina de color oscuro o sangre en la orina. Latidos cardacos acelerados. Estos sntomas pueden indicar una emergencia. Solicite ayuda de inmediato. Llame al 911. No espere a ver si los sntomas desaparecen. No conduzca por sus propios medios hasta el hospital. Resumen Los moretones y el dolor alrededor del lugar de la insercin de la va intravenosa (IV) son frecuentes. Controle el lugar de insercin de la va intravenosa (IV) todos los das para descartar signos de infeccin. Haga reposo como se lo haya indicado el mdico. Retome sus actividades normales como se lo haya indicado el mdico. Busque ayuda de inmediato si tiene sntomas de una reaccin alrgica grave o de una reaccin del sistema inmunitario a una transfusin de sangre. Esta informacin no tiene como fin reemplazar el consejo del mdico. Asegrese de hacerle al mdico cualquier pregunta que tenga. Document Revised: 05/06/2021 Document Reviewed: 05/06/2021 Elsevier Patient Education  2023 [employer] Inc[employer]  

## 2022-01-29 ENCOUNTER — Other Ambulatory Visit: Payer: Self-pay

## 2022-01-29 ENCOUNTER — Emergency Department (HOSPITAL_COMMUNITY): Payer: Medicare Other

## 2022-01-29 ENCOUNTER — Encounter (HOSPITAL_COMMUNITY): Payer: Self-pay | Admitting: Emergency Medicine

## 2022-01-29 ENCOUNTER — Emergency Department (HOSPITAL_COMMUNITY)
Admission: EM | Admit: 2022-01-29 | Discharge: 2022-01-29 | Disposition: A | Discharge status: Home / Self Care | Payer: Medicare Other | Attending: Emergency Medicine | Admitting: Emergency Medicine

## 2022-01-29 DIAGNOSIS — Z20822 Contact with and (suspected) exposure to covid-19: Secondary | ICD-10-CM | POA: Diagnosis not present

## 2022-01-29 DIAGNOSIS — E878 Other disorders of electrolyte and fluid balance, not elsewhere classified: Secondary | ICD-10-CM | POA: Diagnosis not present

## 2022-01-29 DIAGNOSIS — E871 Hypo-osmolality and hyponatremia: Secondary | ICD-10-CM | POA: Diagnosis not present

## 2022-01-29 DIAGNOSIS — I491 Atrial premature depolarization: Secondary | ICD-10-CM | POA: Insufficient documentation

## 2022-01-29 DIAGNOSIS — R509 Fever, unspecified: Secondary | ICD-10-CM | POA: Insufficient documentation

## 2022-01-29 DIAGNOSIS — Z853 Personal history of malignant neoplasm of breast: Secondary | ICD-10-CM | POA: Insufficient documentation

## 2022-01-29 LAB — COMPREHENSIVE METABOLIC PANEL
ALT: 12 U/L (ref 0–44)
AST: 13 U/L — ABNORMAL LOW (ref 15–41)
Albumin: 3 g/dL — ABNORMAL LOW (ref 3.5–5.0)
Alkaline Phosphatase: 51 U/L (ref 38–126)
Anion gap: 9 (ref 5–15)
BUN: 21 mg/dL (ref 8–23)
CO2: 25 mmol/L (ref 22–32)
Calcium: 8.4 mg/dL — ABNORMAL LOW (ref 8.9–10.3)
Chloride: 95 mmol/L — ABNORMAL LOW (ref 98–111)
Creatinine, Ser: 0.62 mg/dL (ref 0.44–1.00)
GFR, Estimated: >60 mL/min (ref >60)
Glucose, Bld: 312 mg/dL — ABNORMAL HIGH (ref 70–99)
Potassium: 3.6 mmol/L (ref 3.5–5.1)
Sodium: 129 mmol/L — ABNORMAL LOW (ref 135–145)
Total Bilirubin: 0.9 mg/dL (ref 0.3–1.2)
Total Protein: 6.9 g/dL (ref 6.5–8.1)

## 2022-01-29 LAB — CBC WITH DIFFERENTIAL/PLATELET
Abs Immature Granulocytes: 0 10*3/uL (ref 0.00–0.07)
Basophils Absolute: 0.1 10*3/uL (ref 0.0–0.1)
Basophils Relative: 1 %
Eosinophils Absolute: 0.8 10*3/uL — ABNORMAL HIGH (ref 0.0–0.5)
Eosinophils Relative: 15 %
HCT: 25.7 % — ABNORMAL LOW (ref 36.0–46.0)
Hemoglobin: 8.9 g/dL — ABNORMAL LOW (ref 12.0–15.0)
Lymphocytes Relative: 56 %
Lymphs Abs: 2.9 10*3/uL (ref 0.7–4.0)
MCH: 30.3 pg (ref 26.0–34.0)
MCHC: 34.6 g/dL (ref 30.0–36.0)
MCV: 87.4 fL (ref 80.0–100.0)
Monocytes Absolute: 0.4 10*3/uL (ref 0.1–1.0)
Monocytes Relative: 7 %
Neutro Abs: 1.1 10*3/uL — ABNORMAL LOW (ref 1.7–7.7)
Neutrophils Relative %: 21 %
Platelets: 108 10*3/uL — ABNORMAL LOW (ref 150–400)
RBC: 2.94 MIL/uL — ABNORMAL LOW (ref 3.87–5.11)
RDW: 13.7 % (ref 11.5–15.5)
WBC: 5.2 10*3/uL (ref 4.0–10.5)
nRBC: 1 % — ABNORMAL HIGH (ref 0.0–0.2)

## 2022-01-29 LAB — URINALYSIS, ROUTINE W REFLEX MICROSCOPIC
Bacteria, UA: NONE SEEN
Bilirubin Urine: NEGATIVE
Glucose, UA: >=500 mg/dL — AB
Hgb urine dipstick: NEGATIVE
Ketones, ur: NEGATIVE mg/dL
Leukocytes,Ua: NEGATIVE
Nitrite: NEGATIVE
Protein, ur: NEGATIVE mg/dL
Specific Gravity, Urine: 1.014 (ref 1.005–1.030)
pH: 6 (ref 5.0–8.0)

## 2022-01-29 LAB — RESP PANEL BY RT-PCR (RSV, FLU A&B, COVID)  RVPGX2
Influenza A by PCR: NEGATIVE
Influenza B by PCR: NEGATIVE
Resp Syncytial Virus by PCR: NEGATIVE
SARS Coronavirus 2 by RT PCR: NEGATIVE

## 2022-01-29 LAB — BPAM RBC
Blood Product Expiration Date: 202401312359
ISSUE DATE / TIME: 202401051253
Unit Type and Rh: 5100

## 2022-01-29 LAB — PROTIME-INR
INR: 1.3 — ABNORMAL HIGH (ref 0.8–1.2)
Prothrombin Time: 16.4 seconds — ABNORMAL HIGH (ref 11.4–15.2)

## 2022-01-29 LAB — LACTIC ACID, PLASMA: Lactic Acid, Venous: 1.5 mmol/L (ref 0.5–1.9)

## 2022-01-29 LAB — TYPE AND SCREEN
ABO/RH(D): O POS
Antibody Screen: NEGATIVE
Unit division: 0

## 2022-01-29 MED ORDER — DOXYCYCLINE HYCLATE 100 MG PO CAPS: 100.0000 mg | ORAL_CAPSULE | Freq: Two times a day (BID) | ORAL | 0 refills | Status: AC

## 2022-01-29 MED ORDER — SODIUM CHLORIDE 0.9 % IV BOLUS
1000.0000 mL | Freq: Once | INTRAVENOUS | Status: AC
  Administered 2022-01-29: 1000 mL via INTRAVENOUS

## 2022-01-29 MED ORDER — ACETAMINOPHEN 325 MG PO TABS
650.0000 mg | ORAL_TABLET | Freq: Once | ORAL | Status: AC
  Administered 2022-01-29: 650 mg via ORAL
  Filled 2022-01-29: qty 2

## 2022-01-29 NOTE — ED Triage Notes (Signed)
Patient had chemo and radiation yesterday, had a fever and chills last night, temp of 101. Temp 103 today, no Tylenol or IBU PTA.

## 2022-01-29 NOTE — Discharge Instructions (Addendum)
If [name] continues to have shaking chills or fevers tonight, please begin taking the antibiotic that was prescribed to the pharmacy.  This antibiotic is treatment for a "possible" pneumonia.    It is not clear at this time whether Amanda Duarte has a virus, a bacterial pneumonia, or a reaction to her chemo medication and blood transfusion.

## 2022-01-29 NOTE — Progress Notes (Signed)
Faxed referral order, Dr. Ernestina Penna office note, pathology report, pt demographics to the [facility] Pt Referral Coordinators for Dr. Cherylann Ratel with [facility] [phone]).  Epic fax and traditional fax confirmations received.

## 2022-01-29 NOTE — ED Notes (Signed)
Patient has a urine culture in the main lab 

## 2022-01-29 NOTE — ED Provider Notes (Signed)
[facility] DEPT Provider Note   CSN: [id number] Arrival date & time: 01/29/22  1443     History  No chief complaint on file.   Amanda Duarte is a 67 y.o. female here for fever, chills, sore throat, onset yesterday.  Patient has a history of MDS, high grade, on chemotherapy, and malignant neoplasm of left breast.  Patient had infusion most recently 2 days ago on 01/27/22 with azacitadine injection (started in Oct 2023), and also had blood transfusion for low hgb of 7.3.  Oncologist is Dr Burr Medico.  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  Her FISH test showed 3 abnormal type, her risk of transforming to acute leukemia is very high, likely in the next 6 to 12 months -She started azacitidine injection on November 15, 2021, she was admitted to hospital after chemo, for severe profound pancytopenia, aspiration pneumonia, required intubation. She has recovered well  -Her blood counts improved after first cycle hemotherapy, likely she responded to treatment. -she started cycle 2 azacitidine this week, will reduce to 4 days for this cycle. If she tolerates well, will change to 5 days from next ccyle "    HPI     Home Medications Prior to Admission medications   Medication Sig Start Date End Date Taking? Authorizing Provider  doxycycline (VIBRAMYCIN) 100 MG capsule Take 1 capsule (100 mg total) by mouth 2 (two) times daily for 7 days. 01/29/22 02/05/22 Yes Yulian Gosney, Carola Rhine, MD  insulin lispro (HUMALOG KWIKPEN) 100 UNIT/ML KwikPen Inject 10 Units into the skin 3 (three) times daily. 01/13/22   Charlott Rakes, MD  Accu-Chek Softclix Lancets lancets Use to check blood sugar 3 times daily. E11.65 10/28/21   Argentina Donovan,  PA-C  amLODipine (NORVASC) 5 MG tablet Take 1 tablet (5 mg total) by mouth daily. For blood pressure 01/04/22   [name], NP  atorvastatin (LIPITOR) 40 MG tablet Take 1 tablet (40 mg total) by mouth daily. For cholesterol 01/04/22   [name], NP  Blood Glucose Monitoring Suppl (ACCU-CHEK GUIDE) w/Device KIT use kit to check blood glucose three times daily 02/15/21   [name], NP  Blood Pressure Monitor DEVI Please provide patient with insurance approved blood pressure monitor 01/14/19   [name], NP  citalopram (CELEXA) 20 MG tablet Take 1 tablet (20 mg total) by mouth daily. For depression Patient not taking: Reported on 01/11/2022 01/04/22   [name], NP  Continuous Blood Gluc Receiver (FREESTYLE LIBRE READER) DEVI Monitor blood glucose levels 5-6 times per day E11.65 Patient not taking: Reported on 01/11/2022 01/04/22   [name], NP  Continuous Blood Gluc Sensor (FREESTYLE LIBRE 2 SENSOR) MISC Monitor blood glucose levels 5-6 times per day E11.65 Patient not taking: Reported on 01/11/2022 01/04/22   [name], NP  fenofibrate (TRICOR) 145 MG tablet Take 1 tablet (145 mg total) by mouth daily. For cholesterol 01/04/22   [name], NP  fluticasone (FLONASE) 50 MCG/ACT nasal spray Place 2 sprays into both nostrils daily. 01/04/22   [name], NP  glucose blood (ACCU-CHEK GUIDE) test strip Use to check blood sugar 3 times daily. E11.65 10/28/21   Argentina Donovan, PA-C  hydrOXYzine (ATARAX) 10 MG tablet Take  1 tablet (10 mg total) by mouth 3 (three) times daily as needed for anxiety. 01/04/22   [name], NP  insulin glargine (LANTUS) 100 UNIT/ML Solostar Pen Inject 26 Units into the skin at bedtime. 01/13/22   Kerin Perna, NP  Insulin Pen Needle 31G X 5 MM MISC Inject 1 Device into the skin QID. For use with insulin pens 01/04/22   [name], NP  Insulin Syringe-Needle U-100 30G X 5/16" 1 ML MISC Use as  directed to inject into the skin 3 times daily. 01/04/22   [name], NP  losartan (COZAAR) 50 MG tablet Take 1 tablet (50 mg total) by mouth daily. For blood pressure 01/04/22   [name], NP  ondansetron (ZOFRAN) 8 MG tablet Take 1 tablet (8 mg total) by mouth every 8 (eight) hours as needed for nausea or vomiting. Patient taking differently: Take 8 mg by mouth as needed for nausea or vomiting. 11/10/21   [name], MD  pantoprazole (PROTONIX) 40 MG tablet Take 1 tablet (40 mg total) by mouth daily at 6 (six) AM. For heartburn 01/04/22   [name], NP  potassium chloride SA (KLOR-CON M) 20 MEQ tablet Take 1 tablet (20 mEq total) by mouth daily. 01/04/22   [name], NP  prochlorperazine (COMPAZINE) 10 MG tablet Take 1 tablet (10 mg total) by mouth every 6 (six) hours as needed for nausea or vomiting. 11/10/21   [name], MD      Allergies    Victoza [liraglutide]    Review of Systems   Review of Systems  Physical Exam Updated Vital Signs BP (!) 106/46   Pulse 94   Temp (!) 103 F (39.4 C) (Oral)   Resp (!) 23   SpO2 97%  Physical Exam Constitutional:      General: She is not in acute distress. HENT:     Head: Normocephalic and atraumatic.     Mouth/Throat:     Mouth: Mucous membranes are dry.     Pharynx: No oropharyngeal exudate or posterior oropharyngeal erythema.  Eyes:     Conjunctiva/sclera: Conjunctivae normal.     Pupils: Pupils are equal, round, and reactive to light.  Cardiovascular:     Rate and Rhythm: Normal rate and regular rhythm.  Pulmonary:     Effort: Pulmonary effort is normal. No respiratory distress.  Abdominal:     General: There is no distension.     Tenderness: There is no abdominal tenderness.  Skin:    General: Skin is warm and dry.  Neurological:     General: No focal deficit present.     Mental Status: She is alert. Mental status is at baseline.  Psychiatric:        Mood and Affect: Mood normal.        Behavior:  Behavior normal.     ED Results / Procedures / Treatments   Labs (all labs ordered are listed, but only abnormal results are displayed) Labs Reviewed  COMPREHENSIVE METABOLIC PANEL - Abnormal; Notable for the following components:      Result Value   Sodium 129 (*)    Chloride 95 (*)    Glucose, Bld 312 (*)    Calcium 8.4 (*)    Albumin 3.0 (*)    AST 13 (*)    All other components within normal limits  CBC WITH DIFFERENTIAL/PLATELET - Abnormal; Notable for the following components:   RBC 2.94 (*)    Hemoglobin 8.9 (*)  HCT 25.7 (*)    Platelets 108 (*)    nRBC 1.0 (*)    Neutro Abs 1.1 (*)    Eosinophils Absolute 0.8 (*)    All other components within normal limits  PROTIME-INR - Abnormal; Notable for the following components:   Prothrombin Time 16.4 (*)    INR 1.3 (*)    All other components within normal limits  URINALYSIS, ROUTINE W REFLEX MICROSCOPIC - Abnormal; Notable for the following components:   Glucose, UA >=500 (*)    All other components within normal limits  RESP PANEL BY RT-PCR (RSV, FLU A&B, COVID)  RVPGX2  CULTURE, BLOOD (ROUTINE X 2)  CULTURE, BLOOD (ROUTINE X 2)  LACTIC ACID, PLASMA    EKG None  Radiology DG Chest 2 View  Result Date: 01/29/2022 CLINICAL DATA:  Suspected Sepsis EXAM: CHEST - 2 VIEW COMPARISON:  12/08/2021. FINDINGS: The heart size and mediastinal contours are within normal limits. There is diffuse pulmonary interstitial prominence which could be seen with atypical infection, edema or interstitial lung disease. There is no focal consolidation. The visualized skeletal structures are unremarkable. Postop changes left mastectomy. IMPRESSION: Nonspecific interstitial prominence consistent with atypical infection, edema or interstitial lung disease. No focal consolidation. Electronically Signed   By: Sammie Bench M.D.   On: 01/29/2022 15:20    Procedures Procedures    Medications Ordered in ED Medications  acetaminophen  (TYLENOL) tablet 650 mg (650 mg Oral Given 01/29/22 1626)  sodium chloride 0.9 % bolus 1,000 mL (1,000 mLs Intravenous New Bag/Given 01/29/22 1626)    ED Course/ Medical Decision Making/ A&P Clinical Course as of 01/29/22 1723  Sat Jan 29, 2022  1702 Per review of prior office visits in Nov/Dec 2023, SBP of 100-110 mmhg appears to be close to patient's baseline level. [MT]  1710 Viral panel is negative. [MT]    Clinical Course User Index [MT] Indea Dearman, Carola Rhine, MD                           Medical Decision Making Amount and/or Complexity of Data Reviewed Labs: ordered. Radiology: ordered.  Risk OTC drugs. Prescription drug management.   This patient presents to the ED with concern for fever of unknown origin. This involves an extensive number of treatment options, and is a complaint that carries with it a high risk of complications and morbidity.  The differential diagnosis includes viral illness vs bacterial infection vs blood transfusion reaction vs chemo reaction vs other  Co-morbidities that complicate the patient evaluation: history of recent infusions and chemotherapy at risk of infection/medication side effects  Additional history obtained from patient's daughter at bedside  External records from outside source obtained and reviewed including oncology office note from 01/27/22  I ordered and personally interpreted labs.  The pertinent results include: No leukocytosis.  Hemoglobin stable after transfusion.  Patient has some mild hyponatremia which appears to be chronic.  No evidence of infection UA.  Lactate within normal limits.  I ordered imaging studies including dg chest I independently visualized and interpreted imaging which showed interstitial edema versus atypical infection pattern; no focal consolidation noted I agree with the radiologist interpretation  The patient was maintained on a cardiac monitor.  I personally viewed and interpreted the cardiac monitored which  showed an underlying rhythm of: NSR  I ordered medication including tylenol for fever (patient did not take any medications at home for this).  Normal saline fluid bolus was ordered for  hyponatremia and hypochloremia.  I have reviewed the patients home medicines and have made adjustments as needed  Test Considered: low suspicion for meningitis, did not feel LP indicated at this time.   After the interventions noted above, I reevaluated the patient and found that they have: stayed the same  The patient remained clinically well appearing, with no evidence of hypoxia, dehydration, rigors, or shock in the emergency department.  Hospitalization was considered but I felt that the patient was stable for outpatient management at this time.  I discussed with the patient and her daughter a "watch and wait approach" with doxycycline for possible atypical pneumonia.  I prefer to err on the side of caution and provide the antibiotic prescription given that she had significant complications of pneumonia in the past.  But I think is reasonable to also wait another 24 hours, as she has had resolution of her symptoms in the ED, and she may have experienced a transient reaction to either transfusion or to her last chemo medication.  We discussed the small possibility that her blood cultures may return positive, at which time she would be contacted and told to come immediately back to the emergency department.  But my suspicion for bacteremia at this point is low enough I do think it is reasonable to manage her outpatient.  Dispostion:  After consideration of the diagnostic results and the patients response to treatment, I feel that the patent would benefit from close outpatient follow up.         Final Clinical Impression(s) / ED Diagnoses Final diagnoses:  Fever, unspecified fever cause  Hyponatremia    Rx / DC Orders ED Discharge Orders          Ordered    doxycycline (VIBRAMYCIN) 100 MG capsule   2 times daily        01/29/22 1717              Isebella Upshur, Carola Rhine, MD 01/29/22 1723

## 2022-01-31 ENCOUNTER — Other Ambulatory Visit: Payer: Self-pay

## 2022-01-31 ENCOUNTER — Telehealth: Payer: Self-pay | Admitting: Hematology

## 2022-01-31 ENCOUNTER — Ambulatory Visit: Payer: Medicare Other | Attending: Nurse Practitioner

## 2022-01-31 DIAGNOSIS — E1165 Type 2 diabetes mellitus with hyperglycemia: Secondary | ICD-10-CM

## 2022-01-31 DIAGNOSIS — E785 Hyperlipidemia, unspecified: Secondary | ICD-10-CM

## 2022-01-31 NOTE — Telephone Encounter (Signed)
Patient aware of upcoming appointments  

## 2022-02-01 ENCOUNTER — Telehealth: Payer: Self-pay

## 2022-02-01 ENCOUNTER — Other Ambulatory Visit: Payer: Self-pay

## 2022-02-01 ENCOUNTER — Other Ambulatory Visit: Payer: Self-pay | Admitting: Nurse Practitioner

## 2022-02-01 DIAGNOSIS — R509 Fever, unspecified: Secondary | ICD-10-CM

## 2022-02-01 DIAGNOSIS — E1165 Type 2 diabetes mellitus with hyperglycemia: Secondary | ICD-10-CM

## 2022-02-01 DIAGNOSIS — C50212 Malignant neoplasm of upper-inner quadrant of left female breast: Secondary | ICD-10-CM

## 2022-02-01 LAB — LIPID PANEL
Chol/HDL Ratio: 2.2 ratio (ref 0.0–4.4)
Cholesterol, Total: 79 mg/dL — ABNORMAL LOW (ref 100–199)
HDL: 36 mg/dL — ABNORMAL LOW (ref >39)
LDL Chol Calc (NIH): 22 mg/dL (ref 0–99)
Triglycerides: 113 mg/dL (ref 0–149)
VLDL Cholesterol Cal: 21 mg/dL (ref 5–40)

## 2022-02-01 LAB — HEMOGLOBIN A1C
Est. average glucose Bld gHb Est-mCnc: 260 mg/dL
Hgb A1c MFr Bld: 10.7 % — ABNORMAL HIGH (ref 4.8–5.6)

## 2022-02-01 MED ORDER — INSULIN GLARGINE 100 UNIT/ML SOLOSTAR PEN: 20.0000 [IU] | PEN_INJECTOR | Freq: Two times a day (BID) | SUBCUTANEOUS | 1 refills | Status: DC

## 2022-02-01 NOTE — Telephone Encounter (Signed)
Spoke with pt's daughter [name] regarding pt's elevated temperatures.  [name] stated that the pt is still having elevated temps.  [name] stated the pt's temp yesterday 01/31/2022 was 101.0.  [name] stated the pt is not having any new symptoms but did state that the pt has facial swelling which the pt had the last time she saw [name].  [name] stated she forgot to let [name] know that the pt's face is swelling.  Informed [name] that [name] would like for the pt to have a CXR.  Pt is scheduled for lab appointment on 02/03/2022 and pt will go to have CXR done after her lab appt.  Scheduled pt to see [name] on 02/04/2022 and reminded [name] that the pt has an infusion appt as well on 02/04/2022.  Since Dr. Ernestina Penna schedule is full on Friday 02/04/2022, scheduled pt at 3:20pm after her infusion appt.  [name] confirmed her appts.

## 2022-02-02 ENCOUNTER — Telehealth: Payer: Self-pay

## 2022-02-02 ENCOUNTER — Other Ambulatory Visit: Payer: Self-pay

## 2022-02-02 DIAGNOSIS — R509 Fever, unspecified: Secondary | ICD-10-CM

## 2022-02-02 DIAGNOSIS — Z170 Estrogen receptor positive status [ER+]: Secondary | ICD-10-CM

## 2022-02-02 DIAGNOSIS — D46Z Other myelodysplastic syndromes: Secondary | ICD-10-CM

## 2022-02-02 NOTE — Telephone Encounter (Signed)
Called patients daughter to see if the patient is still having tempature spikes. Daughter stated that she has not had a temp since yesterday. Made nurse Tammi Sou RN aware and also [name].

## 2022-02-03 ENCOUNTER — Other Ambulatory Visit: Payer: Self-pay

## 2022-02-03 ENCOUNTER — Inpatient Hospital Stay: Payer: Medicare Other

## 2022-02-03 ENCOUNTER — Telehealth: Payer: Self-pay

## 2022-02-03 ENCOUNTER — Ambulatory Visit (HOSPITAL_COMMUNITY)
Admission: RE | Admit: 2022-02-03 | Discharge: 2022-02-03 | Disposition: A | Discharge status: Home / Self Care | Payer: Medicare Other | Source: Ambulatory Visit | Attending: Hematology | Admitting: Hematology

## 2022-02-03 DIAGNOSIS — R509 Fever, unspecified: Secondary | ICD-10-CM

## 2022-02-03 DIAGNOSIS — D46Z Other myelodysplastic syndromes: Secondary | ICD-10-CM

## 2022-02-03 DIAGNOSIS — Z170 Estrogen receptor positive status [ER+]: Secondary | ICD-10-CM

## 2022-02-03 DIAGNOSIS — C50212 Malignant neoplasm of upper-inner quadrant of left female breast: Secondary | ICD-10-CM | POA: Insufficient documentation

## 2022-02-03 DIAGNOSIS — Z5111 Encounter for antineoplastic chemotherapy: Secondary | ICD-10-CM | POA: Diagnosis not present

## 2022-02-03 LAB — CULTURE, BLOOD (ROUTINE X 2)
Culture: NO GROWTH
Culture: NO GROWTH

## 2022-02-03 LAB — CMP (CANCER CENTER ONLY)
ALT: 7 U/L — ABNORMAL LOW (ref 10–47)
AST: 9 U/L — ABNORMAL LOW (ref 15–41)
Albumin: 3.5 g/dL (ref 3.5–5.0)
Alkaline Phosphatase: 52 U/L (ref 38–126)
Anion gap: 6 (ref 5–15)
BUN: 20 mg/dL (ref 8–23)
CO2: 26 mmol/L (ref 22–32)
Calcium: 9.2 mg/dL (ref 8.9–10.3)
Chloride: 103 mmol/L (ref 98–111)
Creatinine: 0.52 mg/dL — ABNORMAL LOW (ref 0.60–1.20)
GFR, Estimated: >60 mL/min (ref >60)
Glucose, Bld: 243 mg/dL — ABNORMAL HIGH (ref 70–99)
Potassium: 4.1 mmol/L (ref 3.5–5.1)
Sodium: 135 mmol/L (ref 135–145)
Total Bilirubin: 0.5 mg/dL (ref 0.3–1.2)
Total Protein: 7.2 g/dL (ref 6.5–8.1)

## 2022-02-03 LAB — CBC WITH DIFFERENTIAL (CANCER CENTER ONLY)
Abs Immature Granulocytes: 0 10*3/uL (ref 0.00–0.07)
Basophils Absolute: 0.1 10*3/uL (ref 0.0–0.1)
Basophils Relative: 2 %
Eosinophils Absolute: 0.7 10*3/uL — ABNORMAL HIGH (ref 0.0–0.5)
Eosinophils Relative: 22 %
HCT: 23.2 % — ABNORMAL LOW (ref 36.0–46.0)
Hemoglobin: 7.8 g/dL — ABNORMAL LOW (ref 12.0–15.0)
Lymphocytes Relative: 62 %
Lymphs Abs: 2 10*3/uL (ref 0.7–4.0)
MCH: 29.7 pg (ref 26.0–34.0)
MCHC: 33.6 g/dL (ref 30.0–36.0)
MCV: 88.2 fL (ref 80.0–100.0)
Metamyelocytes Relative: 1 %
Monocytes Absolute: 0 10*3/uL — ABNORMAL LOW (ref 0.1–1.0)
Monocytes Relative: 1 %
Neutro Abs: 0.4 10*3/uL — CL (ref 1.7–7.7)
Neutrophils Relative %: 12 %
Platelet Count: 125 10*3/uL — ABNORMAL LOW (ref 150–400)
RBC: 2.63 MIL/uL — ABNORMAL LOW (ref 3.87–5.11)
RDW: 14.2 % (ref 11.5–15.5)
WBC Count: 3.2 10*3/uL — ABNORMAL LOW (ref 4.0–10.5)
nRBC: 0.6 % — ABNORMAL HIGH (ref 0.0–0.2)

## 2022-02-03 LAB — SAMPLE TO BLOOD BANK

## 2022-02-03 MED ORDER — SODIUM CHLORIDE 0.9% FLUSH: 10.0000 mL | INTRAVENOUS | Status: AC | PRN

## 2022-02-03 MED ORDER — HEPARIN SOD (PORK) LOCK FLUSH 100 UNIT/ML IV SOLN: 500.0000 [IU] | Freq: Once | INTRAVENOUS | Status: AC | PRN

## 2022-02-03 NOTE — Telephone Encounter (Signed)
CRITICAL VALUE STICKER  CRITICAL VALUE: ANC 0.4  RECEIVER (on-site recipient of call): Maurine Simmering CMA  DATE & TIME NOTIFIED: 02/03/2022 1215  MESSENGER (representative from lab): Verdis Frederickson  MD NOTIFIED: [name]  TIME OF NOTIFICATION: 1215  RESPONSE: Notified Physician

## 2022-02-04 ENCOUNTER — Other Ambulatory Visit: Payer: Self-pay

## 2022-02-04 ENCOUNTER — Inpatient Hospital Stay: Payer: Medicare Other

## 2022-02-04 ENCOUNTER — Inpatient Hospital Stay (HOSPITAL_BASED_OUTPATIENT_CLINIC_OR_DEPARTMENT_OTHER): Payer: Medicare Other | Admitting: Hematology

## 2022-02-04 ENCOUNTER — Encounter: Payer: Self-pay | Admitting: Hematology

## 2022-02-04 DIAGNOSIS — D46Z Other myelodysplastic syndromes: Secondary | ICD-10-CM

## 2022-02-04 DIAGNOSIS — D649 Anemia, unspecified: Secondary | ICD-10-CM

## 2022-02-04 DIAGNOSIS — Z170 Estrogen receptor positive status [ER+]: Secondary | ICD-10-CM

## 2022-02-04 DIAGNOSIS — C50212 Malignant neoplasm of upper-inner quadrant of left female breast: Secondary | ICD-10-CM

## 2022-02-04 DIAGNOSIS — Z5111 Encounter for antineoplastic chemotherapy: Secondary | ICD-10-CM | POA: Diagnosis not present

## 2022-02-04 LAB — PREPARE RBC (CROSSMATCH)

## 2022-02-04 MED ORDER — ACETAMINOPHEN 325 MG PO TABS
650.0000 mg | ORAL_TABLET | Freq: Once | ORAL | Status: AC
  Administered 2022-02-04: 650 mg via ORAL
  Filled 2022-02-04: qty 2

## 2022-02-04 MED ORDER — LEVOFLOXACIN 500 MG PO TABS: 500.0000 mg | ORAL_TABLET | Freq: Every day | ORAL | 0 refills | Status: DC

## 2022-02-04 MED ORDER — SODIUM CHLORIDE 0.9% IV SOLUTION
250.0000 mL | Freq: Once | INTRAVENOUS | Status: AC
  Administered 2022-02-04: 250 mL via INTRAVENOUS

## 2022-02-04 MED ORDER — DIPHENHYDRAMINE HCL 25 MG PO CAPS
25.0000 mg | ORAL_CAPSULE | Freq: Once | ORAL | Status: AC
  Administered 2022-02-04: 25 mg via ORAL
  Filled 2022-02-04: qty 1

## 2022-02-04 NOTE — Progress Notes (Addendum)
[facility]   [phone] [phone]   Clinic Follow up Note   Patient Care Team: [name], NP as PCP - General (Nurse Practitioner) [name], MD as Consulting Physician (Hematology) Fanny Skates, MD as Consulting Physician (General Surgery) Alla Feeling, NP as Nurse Practitioner (Nurse Practitioner) Gery Pray, MD as Consulting Physician (Radiation Oncology) Art Buff, MD as Referring Physician (Hematology and Oncology) Lynne Logan, RN as [place] Management Aris Lot, Ramon Dredge, MD as Referring Physician (Hematology and Oncology)  Date of Service:  02/04/2022  CHIEF COMPLAINT: f/u of of MDS, h/o breast cancer   CURRENT THERAPY:   Azacitadine injection, days 1-5, started on 11/15/21  (first cycle 7 days)   ASSESSMENT:  Amanda Duarte is a 67 y.o. 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  Her FISH test showed 3 abnormal type, her risk of transforming to acute leukemia is very high, likely in the next 6 to 12 months -She started azacitidine injection on November 15, 2021, she was admitted to hospital after chemo, for severe profound pancytopenia, aspiration pneumonia, required intubation. She has recovered well  -Her blood counts improved after first cycle chemotherapy, likely she responded to treatment. -she started cycle 2 azacitidine on 01/25/2022, will reduce to 4 days for this cycle. If she tolerates well, will change to 5 days from next cyle  -She had intermittent fever last week, seen in the ED, blood and urine cultures in our office 2 days ago has been negative, chest x-ray was unremarkable.  She was given doxycycline by ED physician last week.  She has developed significant neutropenia today, I  will call in prophylactic Levaquin 500 milligram daily due to her high risk for infection. -Follow-up lab weekly -I have referred her to [facility] bone marrow transplant team, she will see Dr. Quentin Ore in a few weeks.   PLAN: - lab reviewed, will proceed with 1 unit RBC today. -lab reviewed, cultures have been negative. she developed significant neutropenia -prescribe prophylactic Levaquin  -lab weekly -C3 chemo on 1/29, f/u before chemo  -I spoke with her daughter today   SUMMARY OF ONCOLOGIC HISTORY: Oncology History Overview Note  Cancer Staging Malignant neoplasm of upper-inner quadrant of left breast in female, estrogen receptor positive ([place]) Staging form: Breast, AJCC 8th Edition - Clinical stage from 05/01/2017: Stage Unknown (cTX, cN2, cM0, G3, ER-, PR-, HER2-) - Signed by [name], MD on 11/01/2017 - Pathologic stage from 11/20/2017: No Stage Recommended (ypT0, pN63m, cM0, GX, ER-, PR-, HER2-) - Signed by [name] on 12/10/2017     Malignant neoplasm of upper-inner quadrant of left breast in female, estrogen receptor positive ([place]  04/28/2017 Mammogram   IMPRESSION: Two adjacent masses/enlarged lymph nodes in the LOWER LEFT axilla, the largest measuring 2.1 cm. Tissue sampling of 1 of these is recommended to exclude malignancy/lymphoma. No mammographic evidence of breast malignancy bilaterally.    05/01/2017 Initial Biopsy   Diagnosis 05/01/17 Lymph node, needle/core biopsy, low left inferior axillary - METASTATIC POORLY DIFFERENTIATED CARCINOMA TO A LYMPH NODE. SEE NOTE.   05/01/2017 Cancer Staging   Staging form: Breast, AJCC 8th Edition - Clinical stage from 05/01/2017: Stage Unknown (cTX, cN1, cM0, G3, ER-, PR-, HER2-) - Signed by [name] on 06/02/2017  05/01/2017 Receptors her2   Lymph Node Biopsy:  HER2-Negative  PR-Negative  ER- Negative    05/10/2017 Imaging   MR Breast W WO Contrast 05/10/17 IMPRESSION: No MRI evidence of malignancy in the  right breast. Area of week stippled non mass enhancement in the left breast upper inner quadrant. Separate area of thin linear non mass enhancement in the subareolar left breast. Three grossly abnormal left axillary lymph nodes, and more than 4 less than 1 cm indeterminate left axillary lymph nodes. No evidence of right axillary lymphadenopathy.   05/17/2017 Initial Biopsy   Diagnosis 05/17/17 1. 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 2. Breast, left, needle core biopsy, upper inner post MR enhancement - MICROSCOPIC FOCUS OF DUCTAL CARCINOMA IN SITU.   05/17/2017 Receptors her2   Left Breast Biopsy:  ER-Negative PR-Negative HER2-Negative   05/22/2017 Initial Diagnosis   Malignant neoplasm of upper-inner quadrant of left breast in female, estrogen receptor positive ([place])   06/01/2017 Imaging   [place] Scan 06/01/17 IMPRESSION: No definite scintigraphic evidence of osseous metastatic disease.   Posttraumatic and postsurgical uptake at the LEFT knee.   Nonspecific soft tissue distribution of tracer at the LEFT thigh, could represent contusion, hemorrhage, soft tissue edema, or soft tissue calcifications such as from heterotopic calcification and myositis ossificans; recommend clinical correlation and consider dedicated LEFT femoral radiographs.   Single focus of nonspecific increased tracer localization at the lateral LEFT orbit.     06/01/2017 Imaging   06/01/2017 Bone Scan IMPRESSION: No definite scintigraphic evidence of osseous metastatic disease.   Posttraumatic and postsurgical uptake at the LEFT knee.   Nonspecific soft tissue distribution of tracer at the LEFT thigh, could represent contusion, hemorrhage, soft tissue edema, or soft tissue calcifications such as from heterotopic calcification and myositis ossificans; recommend clinical correlation and consider dedicated LEFT femoral  radiographs.   Single focus of nonspecific increased tracer localization at the lateral LEFT orbit.   06/09/2017 -  Chemotherapy   ddAC every 2 weeks for 4 weeks starting 06/09/17-07/21/17 followed by weekly [place] and taxol for 12 weeks 08/04/17-10/20/17.    10/24/2017 Imaging   Breast MRI B/l 10/24/17 IMPRESSION: 1. No residual enhancement in the LEFT breast following neoadjuvant treatment. 2. Significantly smaller LEFT axillary lymph nodes, largest now measuring 1.4 centimeters. The remainder of LEFT axillary lymph nodes demonstrate normal fatty hila. RECOMMENDATION: Treatment plan for known LEFT breast cancer.   11/20/2017 Cancer Staging   Staging form: Breast, AJCC 8th Edition - Pathologic stage from 11/20/2017: No Stage Recommended (ypT0, pN73m, cM0, GX, ER-, PR-, HER2-) - Signed by [name] on 12/10/2017   11/20/2017 Surgery   Left mastectomy and SLN biopsy by Dr. IDalbert Batman   11/20/2017 Pathology Results   Breast, modified radical mastectomy , left - MICROSCOPIC FOCI OF RESIDUAL METASTATIC CARCINOMA INVOLVING TWO OF EIGHT LYMPH NODES (2/8); LARGEST CONTINUOUS FOCUS MEASURES 0.1 CM. - NO EVIDENCE OF RESIDUAL CARCINOMA IN THE MASTECTOMY SPECIMEN - MARKED THERAPY-RELATED CHANGES, INCLUDING DENSE HYALIN FIBROSIS     11/20/2017 Receptors her2   ER- PR- HER2- (IHC 1+)   01/02/2018 - 02/14/2018 Radiation Therapy   Adjuvant Radiation 01/02/18 - 02/14/18   07/31/2018 Imaging   Baseline DEXA 07/31/18  ASSESSMENT: The BMD measured at AP Spine L1-L4 is 0.973 g/cm2 with a T-score of -1.7.   This patient is considered OSTEOPENIC according to [facility] criteria. The scan quality is good.  Site Region Measured Date Measured Age YA T-score BMD Significant CHANGE   AP Spine  L1-L4      07/31/2018    63.1         -1.7    0.973 g/cm2   DualFemur Neck Left  07/31/2018    63.1         -1.5    0.828 g/cm2   DualFemur Total Mean 07/31/2018    63.1         0.1      1.016 g/cm2   11/05/2018 Survivorship   Per [name], NP    11/15/2021 -  Chemotherapy   Patient is on Treatment Plan : MYELODYSPLASIA  Azacitidine SQ D1-7 q28d        INTERVAL HISTORY:  Amanda Duarte is here for a follow up of   MDS, h/o breast cancer  She was last seen by me on 01/27/2022 She presents to the clinic interpreter.Pt reports of having a fever on Tuesday.Pt states she feels fine. Pt denies having diarrhea.   All other systems were reviewed with the patient and are negative.  MEDICAL HISTORY:  Past Medical History:  Diagnosis Date   Cancer ([place]) 03/2017   left breast cancer   Diabetes mellitus without complication ([place])    Hyperlipidemia    Hypertension    Malignant neoplasm of left breast ([place])    Stroke (cerebrum) ([place])    Tibial plateau fracture, left    04-13-17 had ORIF    SURGICAL HISTORY: Past Surgical History:  Procedure Laterality Date   MASTECTOMY Left 2019   MASTECTOMY MODIFIED RADICAL Left 11/20/2017   Procedure: LEFT MODIFIED RADICAL MASTECTOMY;  Surgeon: Fanny Skates, MD;  Location: [facility];  Service: General;  Laterality: Left;   ORIF TIBIA PLATEAU Left 04/13/2017   Procedure: OPEN REDUCTION INTERNAL FIXATION (ORIF) TIBIAL PLATEAU;  Surgeon: Shona Needles, MD;  Location: [facility];  Service: Orthopedics;  Laterality: Left;   PORT-A-CATH REMOVAL Right 11/20/2017   Procedure: REMOVAL PORT-A-CATH;  Surgeon: Fanny Skates, MD;  Location: [facility];  Service: General;  Laterality: Right;   PORTACATH PLACEMENT Right 05/31/2017   Procedure: INSERTION PORT-A-CATH;  Surgeon: Fanny Skates, MD;  Location: [facility];  Service: General;  Laterality: Right;    I have reviewed the social history and family history with the patient and they are unchanged from previous note.  ALLERGIES:  is allergic to victoza [liraglutide].  MEDICATIONS:  Current Outpatient Medications  Medication Sig Dispense Refill   insulin lispro (HUMALOG KWIKPEN) 100  UNIT/ML KwikPen Inject 10 Units into the skin 3 (three) times daily. 15 mL 1   levofloxacin (LEVAQUIN) 500 MG tablet Take 1 tablet (500 mg total) by mouth daily. 20 tablet 0   Accu-Chek Softclix Lancets lancets Use to check blood sugar 3 times daily. E11.65 100 each 3   amLODipine (NORVASC) 5 MG tablet Take 1 tablet (5 mg total) by mouth daily. For blood pressure 90 tablet 1   atorvastatin (LIPITOR) 40 MG tablet Take 1 tablet (40 mg total) by mouth daily. For cholesterol 90 tablet 2   Blood Glucose Monitoring Suppl (ACCU-CHEK GUIDE) w/Device KIT use kit to check blood glucose three times daily 1 kit 0   Blood Pressure Monitor DEVI Please provide patient with insurance approved blood pressure monitor 1 each 0   citalopram (CELEXA) 20 MG tablet Take 1 tablet (20 mg total) by mouth daily. For depression (Patient not taking: Reported on 01/11/2022) 90 tablet 3  Continuous Blood Gluc Receiver (FREESTYLE LIBRE READER) DEVI Monitor blood glucose levels 5-6 times per day E11.65 (Patient not taking: Reported on 01/11/2022) 1 each 0   Continuous Blood Gluc Sensor (FREESTYLE LIBRE 2 SENSOR) MISC Monitor blood glucose levels 5-6 times per day E11.65 (Patient not taking: Reported on 01/11/2022) 3 each 6   doxycycline (VIBRAMYCIN) 100 MG capsule Take 1 capsule (100 mg total) by mouth 2 (two) times daily for 7 days. 14 capsule 0   fenofibrate (TRICOR) 145 MG tablet Take 1 tablet (145 mg total) by mouth daily. For cholesterol 90 tablet 2   fluticasone (FLONASE) 50 MCG/ACT nasal spray Place 2 sprays into both nostrils daily. 16 g 6   glucose blood (ACCU-CHEK GUIDE) test strip Use to check blood sugar 3 times daily. E11.65 100 each 11   hydrOXYzine (ATARAX) 10 MG tablet Take 1 tablet (10 mg total) by mouth 3 (three) times daily as needed for anxiety. 60 tablet 3   insulin glargine (LANTUS) 100 UNIT/ML Solostar Pen Inject 20 Units into the skin 2 (two) times daily. 36 mL 1   Insulin Pen Needle 31G X 5 MM MISC Inject 1  Device into the skin QID. For use with insulin pens 200 each 6   Insulin Syringe-Needle U-100 30G X 5/16" 1 ML MISC Use as directed to inject into the skin 3 times daily. 200 each 6   losartan (COZAAR) 50 MG tablet Take 1 tablet (50 mg total) by mouth daily. For blood pressure 90 tablet 1   ondansetron (ZOFRAN) 8 MG tablet Take 1 tablet (8 mg total) by mouth every 8 (eight) hours as needed for nausea or vomiting. (Patient taking differently: Take 8 mg by mouth as needed for nausea or vomiting.) 30 tablet 1   pantoprazole (PROTONIX) 40 MG tablet Take 1 tablet (40 mg total) by mouth daily at 6 (six) AM. For heartburn 90 tablet 1   potassium chloride SA (KLOR-CON M) 20 MEQ tablet Take 1 tablet (20 mEq total) by mouth daily. 30 tablet 3   prochlorperazine (COMPAZINE) 10 MG tablet Take 1 tablet (10 mg total) by mouth every 6 (six) hours as needed for nausea or vomiting. 30 tablet 1   No current facility-administered medications for this visit.   Facility-Administered Medications Ordered in Other Visits  Medication Dose Route Frequency Provider Last Rate Last Admin   heparin lock flush 100 unit/mL  500 Units Intracatheter Once PRN [name], MD       sodium chloride flush (NS) 0.9 % injection 10 mL  10 mL Intracatheter PRN [name], MD        PHYSICAL EXAMINATION: ECOG PERFORMANCE STATUS: 1 - Symptomatic but completely ambulatory  There were no vitals filed for this visit. Wt Readings from Last 3 Encounters:  01/27/22 112 lb 4.8 oz (50.9 kg)  01/25/22 113 lb 4 oz (51.4 kg)  01/07/22 109 lb 6.4 oz (49.6 kg)     GENERAL:alert, no distress and comfortable SKIN: skin color normal, no rashes or significant lesions EYES: normal, Conjunctiva are pink and non-injected, sclera clear  NEURO: alert & oriented x 3 with fluent speech LUNGS: (-) clear to auscultation and percussion with normal breathing effort HEART: regular rate & rhythm and no murmurs and no lower extremity edema  LABORATORY DATA:   I have reviewed the data as listed    Latest Ref Rng & Units 02/03/2022   10:59 AM 01/29/2022    3:45 PM 01/28/2022    9:59 AM  CBC  WBC 4.0 - 10.5 K/uL 3.2  5.2  5.1   Hemoglobin 12.0 - 15.0 g/dL 7.8  8.9  7.3   Hematocrit 36.0 - 46.0 % 23.2  25.7  21.2   Platelets 150 - 400 K/uL 125  108  137         Latest Ref Rng & Units 02/03/2022   10:59 AM 01/29/2022    3:45 PM 01/28/2022    9:59 AM  CMP  Glucose 70 - 99 mg/dL 243  312  380   BUN 8 - 23 mg/dL '20  21  21   '$ Creatinine 0.60 - 1.20 mg/dL 0.52  0.62  0.64   Sodium 135 - 145 mmol/L 135  129  134   Potassium 3.5 - 5.1 mmol/L 4.1  3.6  4.1   Chloride 98 - 111 mmol/L 103  95  100   CO2 22 - 32 mmol/L '26  25  28   '$ Calcium 8.9 - 10.3 mg/dL 9.2  8.4  9.4   Total Protein 6.5 - 8.1 g/dL 7.2  6.9  6.7   Total Bilirubin 0.3 - 1.2 mg/dL 0.5  0.9  0.6   Alkaline Phos 38 - 126 U/L 52  51  72   AST 15 - 41 U/L '9  13  8   '$ ALT 10 - 47 U/L '7  12  8       '$ RADIOGRAPHIC STUDIES: I have personally reviewed the radiological images as listed and agreed with the findings in the report. No results found.    Orders Placed This Encounter  Procedures   CBC with Differential ([facility] Only)    Standing Status:   Future    Standing Expiration Date:   0/94/7096   Basic Metabolic Panel - [facility] Only    Standing Status:   Future    Standing Expiration Date:   02/22/2023   All questions were answered. The patient knows to call the clinic with any problems, questions or concerns. No barriers to learning was detected. The total time spent in the appointment was 30 minutes.     [name], MD 02/04/2022   Felicity Coyer, CMA, am acting as scribe for [name], MD.   I have reviewed the above documentation for accuracy and completeness, and I agree with the above.

## 2022-02-04 NOTE — Assessment & Plan Note (Deleted)
-  high grade ([place])  probably chemo related, IPSS-R 7.5, very high risk   -diagnosed in 10/2021, bone marrow biopsy showed high-grade myeloid neoplasm, MDS with 7% blasts.  Her FISH test showed 3 abnormal type, her risk of transforming to acute leukemia is very high, likely in the next 6 to 12 months -She started azacitidine injection on November 15, 2021, she was admitted to hospital after chemo, for severe profound pancytopenia, aspiration pneumonia, required intubation. She has recovered well  -Her blood counts improved after first cycle chemotherapy, likely she responded to treatment. -she started cycle 2 azacitidine on 01/25/2022, will reduce to 4 days for this cycle. If she tolerates well, will change to 5 days from next cyle  -She had intermittent fever last week, seen in the ED, blood and urine cultures in our office 2 days ago has been negative, chest x-ray was unremarkable.  She was given doxycycline by ED physician last week.  She has developed significant neutropenia today, I will call in prophylactic Levaquin 500 milligram daily due to her high risk for infection. -Follow-up lab weekly -I have referred her to [facility] bone marrow transplant team, she will see Dr. Quentin Ore in a few weeks.

## 2022-02-04 NOTE — Patient Instructions (Signed)

## 2022-02-05 LAB — TYPE AND SCREEN
ABO/RH(D): O POS
Antibody Screen: NEGATIVE
Unit division: 0

## 2022-02-05 LAB — BPAM RBC
Blood Product Expiration Date: 202402092359
ISSUE DATE / TIME: 202401121518
Unit Type and Rh: 5100

## 2022-02-07 ENCOUNTER — Ambulatory Visit: Payer: Medicare Other | Admitting: Hematology

## 2022-02-08 LAB — CULTURE, BLOOD (ROUTINE X 2)
Culture: NO GROWTH
Culture: NO GROWTH

## 2022-02-08 NOTE — Progress Notes (Signed)
[place]   [phone] [phone]   Clinic Follow up Note   Patient Care Team: [name], NP as PCP - General (Nurse Practitioner) [name], MD as Consulting Physician (Hematology) Fanny Skates, MD as Consulting Physician (General Surgery) Alla Feeling, NP as Nurse Practitioner (Nurse Practitioner) Gery Pray, MD as Consulting Physician (Radiation Oncology) Art Buff, MD as Referring Physician (Hematology and Oncology) Lynne Logan, RN as [place] Management Aris Lot, Ramon Dredge, MD as Referring Physician (Hematology and Oncology)  Date of Service:  02/10/2022  CHIEF COMPLAINT: f/u of MDS, h/o breast cancer     CURRENT THERAPY:  Azacitadine injection, days 1-5, started on 11/15/21  (first cycle 7 days)     ASSESSMENT:  Amanda Duarte is a 67 y.o. 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  Her FISH test showed 3 abnormal type, her risk of transforming to acute leukemia is very high, likely in the next 6 to 12 months -She started azacitidine injection on November 15, 2021, she was admitted to hospital after chemo, for severe profound pancytopenia, aspiration pneumonia, required intubation. She has recovered well  -Her blood counts improved after first cycle chemotherapy, likely she responded to treatment. -she started cycle 2 azacitidine on 01/25/2022, will reduce to 4 days for this cycle. If she tolerates well, will change to 5 days from next cyle  -She had intermittent fever last week, seen in the ED, blood and urine cultures in our office 2 days ago has been negative, chest x-ray was unremarkable.  She was given doxycycline by ED physician last week.  She has developed significant neutropenia today, I called in  prophylactic Levaquin 500 milligram daily last week due to her high risk for infection. -Follow-up lab weekly -I have referred her to [facility] bone marrow transplant team, she will see Dr. Quentin Ore   Malignant neoplasm of upper-inner quadrant of left breast in female, estrogen receptor positive ([place]) -diagnosed in 04/2017, s/p neoadjuvant ddAC-TC, left mastectomy and adjuvant radiation. She had excellent response. -most recent right mammogram on 07/26/21 and right axilla [name] on 10/27/21 were benign.     PLAN: -lab reviewed.  ANC 0.2 today, will continue prophylactic Levaquin -platelet count 63K, trending down, watch for signs of bleeding -no blood transfusion needed today -I reminded her Dr.Lambird appt next week -lab,and transfusion 02/18/2022 -I will see her back before cycle 3 Vidaza on January 29   SUMMARY OF ONCOLOGIC HISTORY: Oncology History Overview Note  Cancer Staging Malignant neoplasm of upper-inner quadrant of left breast in female, estrogen receptor positive ([place]) Staging form: Breast, AJCC 8th Edition - Clinical stage from 05/01/2017: Stage Unknown (cTX, cN2, cM0, G3, ER-, PR-, HER2-) - Signed by [name], MD on 11/01/2017 - Pathologic stage from 11/20/2017: No Stage Recommended (ypT0, pN48m, cM0, GX, ER-, PR-, HER2-) - Signed by [name] on 12/10/2017     Malignant neoplasm of upper-inner quadrant of left breast in female, estrogen receptor positive ([place]  04/28/2017 Mammogram   IMPRESSION: Two adjacent masses/enlarged lymph nodes in the LOWER LEFT axilla, the largest measuring 2.1 cm. Tissue sampling of 1 of these is recommended to exclude malignancy/lymphoma. No mammographic evidence of breast malignancy bilaterally.    05/01/2017 Initial Biopsy   Diagnosis 05/01/17 Lymph node, needle/core biopsy, low left inferior  axillary - METASTATIC POORLY DIFFERENTIATED CARCINOMA TO A LYMPH NODE. SEE NOTE.   05/01/2017 Cancer Staging   Staging form: Breast, AJCC  8th Edition - Clinical stage from 05/01/2017: Stage Unknown (cTX, cN1, cM0, G3, ER-, PR-, HER2-) - Signed by [name], MD on 06/02/2017    05/01/2017 Receptors her2   Lymph Node Biopsy:  HER2-Negative  PR-Negative  ER- Negative    05/10/2017 Imaging   MR Breast W WO Contrast 05/10/17 IMPRESSION: No MRI evidence of malignancy in the right breast. Area of week stippled non mass enhancement in the left breast upper inner quadrant. Separate area of thin linear non mass enhancement in the subareolar left breast. Three grossly abnormal left axillary lymph nodes, and more than 4 less than 1 cm indeterminate left axillary lymph nodes. No evidence of right axillary lymphadenopathy.   05/17/2017 Initial Biopsy   Diagnosis 05/17/17 1. 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 2. Breast, left, needle core biopsy, upper inner post MR enhancement - MICROSCOPIC FOCUS OF DUCTAL CARCINOMA IN SITU.   05/17/2017 Receptors her2   Left Breast Biopsy:  ER-Negative PR-Negative HER2-Negative   05/22/2017 Initial Diagnosis   Malignant neoplasm of upper-inner quadrant of left breast in female, estrogen receptor positive ([place])   06/01/2017 Imaging   Bo[place] Scan 06/01/17 IMPRESSION: No definite scintigraphic evidence of osseous metastatic disease.   Posttraumatic and postsurgical uptake at the LEFT knee.   Nonspecific soft tissue distribution of tracer at the LEFT thigh, could represent contusion, hemorrhage, soft tissue edema, or soft tissue calcifications such as from heterotopic calcification and myositis ossificans; recommend clinical correlation and consider dedicated LEFT femoral radiographs.   Single focus of nonspecific increased tracer localization at the lateral LEFT orbit.     06/01/2017 Imaging   06/01/2017 Bone Scan IMPRESSION: No definite scintigraphic evidence of osseous metastatic disease.   Posttraumatic  and postsurgical uptake at the LEFT knee.   Nonspecific soft tissue distribution of tracer at the LEFT thigh, could represent contusion, hemorrhage, soft tissue edema, or soft tissue calcifications such as from heterotopic calcification and myositis ossificans; recommend clinical correlation and consider dedicated LEFT femoral radiographs.   Single focus of nonspecific increased tracer localization at the lateral LEFT orbit.   06/09/2017 -  Chemotherapy   ddAC every 2 weeks for 4 weeks starting 06/09/17-07/21/17 followed by weekly [place] and taxol for 12 weeks 08/04/17-10/20/17.    10/24/2017 Imaging   Breast MRI B/l 10/24/17 IMPRESSION: 1. No residual enhancement in the LEFT breast following neoadjuvant treatment. 2. Significantly smaller LEFT axillary lymph nodes, largest now measuring 1.4 centimeters. The remainder of LEFT axillary lymph nodes demonstrate normal fatty hila. RECOMMENDATION: Treatment plan for known LEFT breast cancer.   11/20/2017 Cancer Staging   Staging form: Breast, AJCC 8th Edition - Pathologic stage from 11/20/2017: No Stage Recommended (ypT0, pN72m, cM0, GX, ER-, PR-, HER2-) - Signed by [name] on 12/10/2017   11/20/2017 Surgery   Left mastectomy and SLN biopsy by Dr. IDalbert Batman   11/20/2017 Pathology Results   Breast, modified radical mastectomy , left - MICROSCOPIC FOCI OF RESIDUAL METASTATIC CARCINOMA INVOLVING TWO OF EIGHT LYMPH NODES (2/8); LARGEST CONTINUOUS FOCUS MEASURES 0.1 CM. - NO EVIDENCE OF RESIDUAL CARCINOMA IN THE MASTECTOMY SPECIMEN - MARKED THERAPY-RELATED CHANGES, INCLUDING DENSE HYALIN FIBROSIS     11/20/2017 Receptors her2   ER- PR- HER2- (IHC 1+)   01/02/2018 - 02/14/2018 Radiation Therapy   Adjuvant  Radiation 01/02/18 - 02/14/18   07/31/2018 Imaging   Baseline DEXA 07/31/18  ASSESSMENT: The BMD measured at AP Spine L1-L4 is 0.973 g/cm2 with a T-score of -1.7.   This patient is considered OSTEOPENIC according to [facility]) criteria. The scan quality is good.   Site Region Measured Date Measured Age YA T-score BMD Significant CHANGE   AP Spine  L1-L4      07/31/2018    63.1         -1.7    0.973 g/cm2   DualFemur Neck Left  07/31/2018    63.1         -1.5    0.828 g/cm2   DualFemur Total Mean 07/31/2018    63.1         0.1     1.016 g/cm2   11/05/2018 Survivorship   Per [name], NP    11/15/2021 -  Chemotherapy   Patient is on Treatment Plan : MYELODYSPLASIA  Azacitidine SQ D1-7 q28d        INTERVAL HISTORY:  RECIE CIRRINCIONE is here for a follow up of MDS, h/o breast cancer   She was last seen by me on 02/04/2022 She presents to the clinic accompanied by interpreter. Pt denies fever. Pt state she felt better after treatment last week.      All other systems were reviewed with the patient and are negative.  MEDICAL HISTORY:  Past Medical History:  Diagnosis Date   Cancer ([place]) 03/2017   left breast cancer   Diabetes mellitus without complication ([place])    Hyperlipidemia    Hypertension    Malignant neoplasm of left breast ([place])    Stroke (cerebrum) ([place])    Tibial plateau fracture, left    04-13-17 had ORIF    SURGICAL HISTORY: Past Surgical History:  Procedure Laterality Date   MASTECTOMY Left 2019   MASTECTOMY MODIFIED RADICAL Left 11/20/2017   Procedure: LEFT MODIFIED RADICAL MASTECTOMY;  Surgeon: Fanny Skates, MD;  Location: [facility];  Service: General;  Laterality: Left;   ORIF TIBIA PLATEAU Left 04/13/2017   Procedure: OPEN REDUCTION INTERNAL FIXATION (ORIF) TIBIAL PLATEAU;  Surgeon: Shona Needles, MD;  Location: [facility];  Service: Orthopedics;  Laterality: Left;   PORT-A-CATH REMOVAL Right 11/20/2017   Procedure: REMOVAL PORT-A-CATH;  Surgeon: Fanny Skates, MD;  Location: [facility];  Service: General;  Laterality: Right;   PORTACATH PLACEMENT Right 05/31/2017   Procedure: INSERTION PORT-A-CATH;  Surgeon: Fanny Skates, MD;  Location: [facility];  Service: General;  Laterality: Right;    I have reviewed the social history and family history with the patient and they are unchanged from previous note.  ALLERGIES:  is allergic to victoza [liraglutide].  MEDICATIONS:  Current Outpatient Medications  Medication Sig Dispense Refill   insulin lispro (HUMALOG KWIKPEN) 100 UNIT/ML KwikPen Inject 10 Units into the skin 3 (three) times daily. 15 mL 1   Accu-Chek Softclix Lancets lancets Use to check blood sugar 3 times daily. E11.65 100 each 3   amLODipine (NORVASC) 5 MG tablet Take 1 tablet (5 mg total) by mouth daily. For blood pressure 90 tablet 1   atorvastatin (LIPITOR) 40 MG tablet Take 1 tablet (40 mg total) by mouth daily. For cholesterol 90 tablet 2   Blood Glucose Monitoring Suppl (ACCU-CHEK GUIDE) w/Device KIT use kit to check blood glucose three times daily 1 kit 0   Blood Pressure Monitor DEVI Please provide patient with insurance approved blood  pressure monitor 1 each 0   citalopram (CELEXA) 20 MG tablet Take 1 tablet (20 mg total) by mouth daily. For depression (Patient not taking: Reported on 01/11/2022) 90 tablet 3   Continuous Blood Gluc Receiver (FREESTYLE LIBRE READER) DEVI Monitor blood glucose levels 5-6 times per day E11.65 (Patient not taking: Reported on 01/11/2022) 1 each 0   Continuous Blood Gluc Sensor (FREESTYLE LIBRE 2 SENSOR) MISC Monitor blood glucose levels 5-6 times per day E11.65 (Patient not taking: Reported on 01/11/2022) 3 each 6   fenofibrate (TRICOR) 145 MG tablet Take 1 tablet (145 mg total) by mouth daily. For cholesterol 90 tablet 2   fluticasone (FLONASE) 50 MCG/ACT nasal spray Place 2 sprays into both nostrils daily. 16 g 6   glucose blood (ACCU-CHEK GUIDE) test strip Use to check blood sugar 3 times daily. E11.65 100 each 11   hydrOXYzine (ATARAX) 10 MG tablet Take 1 tablet (10 mg total) by mouth 3 (three) times daily as needed for anxiety. 60 tablet 3   insulin glargine (LANTUS) 100 UNIT/ML  Solostar Pen Inject 20 Units into the skin 2 (two) times daily. 36 mL 1   Insulin Pen Needle 31G X 5 MM MISC Inject 1 Device into the skin QID. For use with insulin pens 200 each 6   Insulin Syringe-Needle U-100 30G X 5/16" 1 ML MISC Use as directed to inject into the skin 3 times daily. 200 each 6   levofloxacin (LEVAQUIN) 500 MG tablet Take 1 tablet (500 mg total) by mouth daily. 20 tablet 0   losartan (COZAAR) 50 MG tablet Take 1 tablet (50 mg total) by mouth daily. For blood pressure 90 tablet 1   ondansetron (ZOFRAN) 8 MG tablet Take 1 tablet (8 mg total) by mouth every 8 (eight) hours as needed for nausea or vomiting. (Patient taking differently: Take 8 mg by mouth as needed for nausea or vomiting.) 30 tablet 1   pantoprazole (PROTONIX) 40 MG tablet Take 1 tablet (40 mg total) by mouth daily at 6 (six) AM. For heartburn 90 tablet 1   potassium chloride SA (KLOR-CON M) 20 MEQ tablet Take 1 tablet (20 mEq total) by mouth daily. 30 tablet 3   prochlorperazine (COMPAZINE) 10 MG tablet Take 1 tablet (10 mg total) by mouth every 6 (six) hours as needed for nausea or vomiting. 30 tablet 1   No current facility-administered medications for this visit.   Facility-Administered Medications Ordered in Other Visits  Medication Dose Route Frequency Provider Last Rate Last Admin   heparin lock flush 100 unit/mL  500 Units Intracatheter Once PRN [name], MD       sodium chloride flush (NS) 0.9 % injection 10 mL  10 mL Intracatheter PRN [name], MD        PHYSICAL EXAMINATION: ECOG PERFORMANCE STATUS: 1 - Symptomatic but completely ambulatory  Vitals:   02/10/22 0817  BP: (!) 133/44  Pulse: 78  Resp: 17  Temp: 97.8 F (36.6 C)  SpO2: 100%   Wt Readings from Last 3 Encounters:  02/10/22 115 lb 3.2 oz (52.3 kg)  01/27/22 112 lb 4.8 oz (50.9 kg)  01/25/22 113 lb 4 oz (51.4 kg)     GENERAL:alert, no distress and comfortable SKIN: skin color normal, no rashes or significant lesions EYES:  normal, Conjunctiva are pink and non-injected, sclera clear  NEURO: alert & oriented x 3 with fluent speech {LABORATORY DATA:  I have reviewed the data as listed    Latest Ref Rng &  Units 02/10/2022    7:50 AM 02/03/2022   10:59 AM 01/29/2022    3:45 PM  CBC  WBC 4.0 - 10.5 K/uL 2.9  3.2  5.2   Hemoglobin 12.0 - 15.0 g/dL 8.8  7.8  8.9   Hematocrit 36.0 - 46.0 % 25.4  23.2  25.7   Platelets 150 - 400 K/uL 63  125  108         Latest Ref Rng & Units 02/10/2022    7:50 AM 02/03/2022   10:59 AM 01/29/2022    3:45 PM  CMP  Glucose 70 - 99 mg/dL 99  243  312   BUN 8 - 23 mg/dL '16  20  21   '$ Creatinine 0.44 - 1.00 mg/dL 0.59  0.52  0.62   Sodium 135 - 145 mmol/L 141  135  129   Potassium 3.5 - 5.1 mmol/L 3.9  4.1  3.6   Chloride 98 - 111 mmol/L 108  103  95   CO2 22 - 32 mmol/L '27  26  25   '$ Calcium 8.9 - 10.3 mg/dL 9.4  9.2  8.4   Total Protein 6.5 - 8.1 g/dL 7.0  7.2  6.9   Total Bilirubin 0.3 - 1.2 mg/dL 0.6  0.5  0.9   Alkaline Phos 38 - 126 U/L 76  52  51   AST 15 - 41 U/L '13  9  13   '$ ALT 0 - 44 U/L '7  7  12       '$ RADIOGRAPHIC STUDIES: I have personally reviewed the radiological images as listed and agreed with the findings in the report. No results found.    No orders of the defined types were placed in this encounter.  All questions were answered. The patient knows to call the clinic with any problems, questions or concerns. No barriers to learning was detected. The total time spent in the appointment was 30 minutes.     [name], MD 02/10/2022   Felicity Coyer, CMA, am acting as scribe for [name], MD.   I have reviewed the above documentation for accuracy and completeness, and I agree with the above.

## 2022-02-10 ENCOUNTER — Other Ambulatory Visit: Payer: Self-pay

## 2022-02-10 ENCOUNTER — Telehealth: Payer: Self-pay

## 2022-02-10 ENCOUNTER — Encounter: Payer: Self-pay | Admitting: Hematology

## 2022-02-10 ENCOUNTER — Inpatient Hospital Stay: Payer: Medicare Other

## 2022-02-10 ENCOUNTER — Inpatient Hospital Stay (HOSPITAL_BASED_OUTPATIENT_CLINIC_OR_DEPARTMENT_OTHER): Payer: Medicare Other | Admitting: Hematology

## 2022-02-10 ENCOUNTER — Telehealth: Payer: Self-pay | Admitting: *Deleted

## 2022-02-10 VITALS — BP 133/44 | HR 78 | Temp 97.8°F | Resp 17 | Ht 60.0 in | Wt 115.2 lb

## 2022-02-10 DIAGNOSIS — C50212 Malignant neoplasm of upper-inner quadrant of left female breast: Secondary | ICD-10-CM | POA: Diagnosis not present

## 2022-02-10 DIAGNOSIS — Z170 Estrogen receptor positive status [ER+]: Secondary | ICD-10-CM

## 2022-02-10 DIAGNOSIS — D46Z Other myelodysplastic syndromes: Secondary | ICD-10-CM

## 2022-02-10 DIAGNOSIS — Z5111 Encounter for antineoplastic chemotherapy: Secondary | ICD-10-CM | POA: Diagnosis not present

## 2022-02-10 LAB — CBC WITH DIFFERENTIAL (CANCER CENTER ONLY)
Abs Immature Granulocytes: 0 10*3/uL (ref 0.00–0.07)
Band Neutrophils: 1 %
Basophils Absolute: 0.1 10*3/uL (ref 0.0–0.1)
Basophils Relative: 2 %
Blasts: 1 %
Eosinophils Absolute: 0.8 10*3/uL — ABNORMAL HIGH (ref 0.0–0.5)
Eosinophils Relative: 27 %
HCT: 25.4 % — ABNORMAL LOW (ref 36.0–46.0)
Hemoglobin: 8.8 g/dL — ABNORMAL LOW (ref 12.0–15.0)
Lymphocytes Relative: 62 %
Lymphs Abs: 1.8 10*3/uL (ref 0.7–4.0)
MCH: 30.8 pg (ref 26.0–34.0)
MCHC: 34.6 g/dL (ref 30.0–36.0)
MCV: 88.8 fL (ref 80.0–100.0)
Metamyelocytes Relative: 1 %
Monocytes Absolute: 0 10*3/uL — ABNORMAL LOW (ref 0.1–1.0)
Monocytes Relative: 0 %
Neutro Abs: 0.2 10*3/uL — CL (ref 1.7–7.7)
Neutrophils Relative %: 6 %
Platelet Count: 63 10*3/uL — ABNORMAL LOW (ref 150–400)
RBC: 2.86 MIL/uL — ABNORMAL LOW (ref 3.87–5.11)
RDW: 13.9 % (ref 11.5–15.5)
WBC Count: 2.9 10*3/uL — ABNORMAL LOW (ref 4.0–10.5)
nRBC: 0.7 % — ABNORMAL HIGH (ref 0.0–0.2)

## 2022-02-10 LAB — CMP (CANCER CENTER ONLY)
ALT: 7 U/L (ref 0–44)
AST: 13 U/L — ABNORMAL LOW (ref 15–41)
Albumin: 3.5 g/dL (ref 3.5–5.0)
Alkaline Phosphatase: 76 U/L (ref 38–126)
Anion gap: 6 (ref 5–15)
BUN: 16 mg/dL (ref 8–23)
CO2: 27 mmol/L (ref 22–32)
Calcium: 9.4 mg/dL (ref 8.9–10.3)
Chloride: 108 mmol/L (ref 98–111)
Creatinine: 0.59 mg/dL (ref 0.44–1.00)
GFR, Estimated: >60 mL/min (ref >60)
Glucose, Bld: 99 mg/dL (ref 70–99)
Potassium: 3.9 mmol/L (ref 3.5–5.1)
Sodium: 141 mmol/L (ref 135–145)
Total Bilirubin: 0.6 mg/dL (ref 0.3–1.2)
Total Protein: 7 g/dL (ref 6.5–8.1)

## 2022-02-10 LAB — SAMPLE TO BLOOD BANK

## 2022-02-10 NOTE — Progress Notes (Signed)
  Care Coordination Note  02/10/2022 Name: Amanda Duarte MRN: [id number] DOB: 01-08-1956  Amanda Duarte is a 67 y.o. year old female who is a primary care patient of [name], NP and is actively engaged with the care management team. I reached out to [name] by phone today to assist with re-scheduling a follow up visit with the RN Case Manager  Follow up plan: Unsuccessful telephone outreach attempt made. A HIPAA compliant phone message was left for the patient providing contact information and requesting a return call.   [place]  Direct Dial: [phone]

## 2022-02-10 NOTE — Assessment & Plan Note (Signed)
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  Her FISH test showed 3 abnormal type, her risk of transforming to acute leukemia is very high, likely in the next 6 to 12 months -She started azacitidine injection on November 15, 2021, she was admitted to hospital after chemo, for severe profound pancytopenia, aspiration pneumonia, required intubation. She has recovered well  -Her blood counts improved after first cycle chemotherapy, likely she responded to treatment. -she started cycle 2 azacitidine on 01/25/2022, will reduce to 4 days for this cycle. If she tolerates well, will change to 5 days from next cyle  -She had intermittent fever last week, seen in the ED, blood and urine cultures in our office 2 days ago has been negative, chest x-ray was unremarkable.  She was given doxycycline by ED physician last week.  She has developed significant neutropenia today, I will call in prophylactic Levaquin 500 milligram daily due to her high risk for infection. -Follow-up lab weekly -I have referred her to [facility] bone marrow transplant team, she will see Dr. Quentin Ore

## 2022-02-10 NOTE — Assessment & Plan Note (Signed)
-  high grade ([place])  probably chemo related, IPSS-R 7.5, very high risk   -diagnosed in 10/2021, bone marrow biopsy showed high-grade myeloid neoplasm, MDS with 7% blasts.  Her FISH test showed 3 abnormal type, her risk of transforming to acute leukemia is very high, likely in the next 6 to 12 months -She started azacitidine injection on November 15, 2021, she was admitted to hospital after chemo, for severe profound pancytopenia, aspiration pneumonia, required intubation. She has recovered well  -Her blood counts improved after first cycle chemotherapy, likely she responded to treatment. -she started cycle 2 azacitidine on 01/25/2022, will reduce to 4 days for this cycle. If she tolerates well, will change to 5 days from next cyle  -She had intermittent fever last week, seen in the ED, blood and urine cultures in our office 2 days ago has been negative, chest x-ray was unremarkable.  She was given doxycycline by ED physician last week.  She has developed significant neutropenia today, I will call in prophylactic Levaquin 500 milligram daily due to her high risk for infection. -Follow-up lab weekly -I have referred her to [facility] bone marrow transplant team, she will see Dr. Quentin Ore in a few weeks.

## 2022-02-10 NOTE — Telephone Encounter (Signed)
Critical lab value reported: ANC 0.2  Verbally notified [name].  No further orders was given d/t Pt is a BMT referral pt.

## 2022-02-10 NOTE — Assessment & Plan Note (Signed)
-  diagnosed in 04/2017, s/p neoadjuvant ddAC-TC, left mastectomy and adjuvant radiation. She had excellent response. -most recent right mammogram on 07/26/21 and right axilla [name] on 10/27/21 were benign.

## 2022-02-11 ENCOUNTER — Inpatient Hospital Stay: Payer: Medicare Other

## 2022-02-11 ENCOUNTER — Inpatient Hospital Stay: Payer: Medicare Other | Admitting: Hematology

## 2022-02-11 NOTE — Progress Notes (Signed)
  Care Coordination Note  02/11/2022 Name: Amanda Duarte MRN: [id number] DOB: 01/21/1956  Amanda Duarte is a 67 y.o. year old female who is a primary care patient of [name], NP and is actively engaged with the care management team. I reached out to [name] by phone today to assist with re-scheduling a follow up visit with the RN Case Manager  Follow up plan: Telephone appointment with care management team member scheduled for:03/04/22  [place]: [phone]

## 2022-02-17 ENCOUNTER — Inpatient Hospital Stay: Payer: Medicare Other

## 2022-02-18 ENCOUNTER — Inpatient Hospital Stay: Payer: Medicare Other | Admitting: Hematology

## 2022-02-18 ENCOUNTER — Inpatient Hospital Stay: Payer: Medicare Other

## 2022-02-18 ENCOUNTER — Other Ambulatory Visit: Payer: Self-pay

## 2022-02-18 DIAGNOSIS — Z5111 Encounter for antineoplastic chemotherapy: Secondary | ICD-10-CM | POA: Diagnosis not present

## 2022-02-18 DIAGNOSIS — Z170 Estrogen receptor positive status [ER+]: Secondary | ICD-10-CM

## 2022-02-18 DIAGNOSIS — D649 Anemia, unspecified: Secondary | ICD-10-CM

## 2022-02-18 DIAGNOSIS — D46Z Other myelodysplastic syndromes: Secondary | ICD-10-CM

## 2022-02-18 LAB — CBC WITH DIFFERENTIAL (CANCER CENTER ONLY)
Abs Immature Granulocytes: 0.08 10*3/uL — ABNORMAL HIGH (ref 0.00–0.07)
Basophils Absolute: 0.1 10*3/uL (ref 0.0–0.1)
Basophils Relative: 3 %
Eosinophils Absolute: 0.1 10*3/uL (ref 0.0–0.5)
Eosinophils Relative: 5 %
HCT: 22 % — ABNORMAL LOW (ref 36.0–46.0)
Hemoglobin: 7.6 g/dL — ABNORMAL LOW (ref 12.0–15.0)
Immature Granulocytes: 3 %
Lymphocytes Relative: 66 %
Lymphs Abs: 1.7 10*3/uL (ref 0.7–4.0)
MCH: 30.6 pg (ref 26.0–34.0)
MCHC: 34.5 g/dL (ref 30.0–36.0)
MCV: 88.7 fL (ref 80.0–100.0)
Monocytes Absolute: 0.1 10*3/uL (ref 0.1–1.0)
Monocytes Relative: 3 %
Neutro Abs: 0.5 10*3/uL — ABNORMAL LOW (ref 1.7–7.7)
Neutrophils Relative %: 20 %
Platelet Count: 77 10*3/uL — ABNORMAL LOW (ref 150–400)
RBC: 2.48 MIL/uL — ABNORMAL LOW (ref 3.87–5.11)
RDW: 14.1 % (ref 11.5–15.5)
WBC Count: 2.6 10*3/uL — ABNORMAL LOW (ref 4.0–10.5)
nRBC: 0.8 % — ABNORMAL HIGH (ref 0.0–0.2)

## 2022-02-18 LAB — CMP (CANCER CENTER ONLY)
ALT: 5 U/L (ref 0–44)
AST: 12 U/L — ABNORMAL LOW (ref 15–41)
Albumin: 3.5 g/dL (ref 3.5–5.0)
Alkaline Phosphatase: 94 U/L (ref 38–126)
Anion gap: 6 (ref 5–15)
BUN: 19 mg/dL (ref 8–23)
CO2: 26 mmol/L (ref 22–32)
Calcium: 9.1 mg/dL (ref 8.9–10.3)
Chloride: 107 mmol/L (ref 98–111)
Creatinine: 0.67 mg/dL (ref 0.44–1.00)
GFR, Estimated: >60 mL/min (ref >60)
Glucose, Bld: 125 mg/dL — ABNORMAL HIGH (ref 70–99)
Potassium: 3.8 mmol/L (ref 3.5–5.1)
Sodium: 139 mmol/L (ref 135–145)
Total Bilirubin: 0.6 mg/dL (ref 0.3–1.2)
Total Protein: 7.5 g/dL (ref 6.5–8.1)

## 2022-02-18 LAB — PREPARE RBC (CROSSMATCH)

## 2022-02-18 LAB — SAMPLE TO BLOOD BANK

## 2022-02-18 MED ORDER — SODIUM CHLORIDE 0.9% IV SOLUTION
250.0000 mL | Freq: Once | INTRAVENOUS | Status: AC
  Administered 2022-02-18: 250 mL via INTRAVENOUS

## 2022-02-18 MED ORDER — DIPHENHYDRAMINE HCL 25 MG PO CAPS
25.0000 mg | ORAL_CAPSULE | Freq: Once | ORAL | Status: AC
  Administered 2022-02-18: 25 mg via ORAL
  Filled 2022-02-18: qty 1

## 2022-02-18 MED ORDER — ACETAMINOPHEN 325 MG PO TABS
650.0000 mg | ORAL_TABLET | Freq: Once | ORAL | Status: AC
  Administered 2022-02-18: 650 mg via ORAL
  Filled 2022-02-18: qty 2

## 2022-02-18 NOTE — Progress Notes (Signed)
OK to run blood at 300 ml/hr per [name].

## 2022-02-18 NOTE — Patient Instructions (Signed)

## 2022-02-19 LAB — TYPE AND SCREEN
ABO/RH(D): O POS
Antibody Screen: NEGATIVE
Unit division: 0

## 2022-02-19 LAB — BPAM RBC
Blood Product Expiration Date: 202402232359
ISSUE DATE / TIME: 202401261135
Unit Type and Rh: 5100

## 2022-02-20 NOTE — Assessment & Plan Note (Signed)
-  Continue medication and follow-up with PCP

## 2022-02-20 NOTE — Assessment & Plan Note (Signed)
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  Her FISH test showed 3 abnormal type, her risk of transforming to acute leukemia is very high, likely in the next 6 to 12 months -She started azacitidine injection on November 15, 2021, she was admitted to hospital after chemo, for severe profound pancytopenia, aspiration pneumonia, required intubation. She has recovered well  -Her blood counts improved after first cycle chemotherapy, likely she responded to treatment. -she started cycle 2 azacitidine on 01/25/2022, will reduce to 4 days for this cycle. If she tolerates well, will change to 5 days from next cyle  -She was evaluated for bone marrow transplant at [facility] last week, she has not decided if she want to proceed

## 2022-02-20 NOTE — Assessment & Plan Note (Signed)
-  diagnosed in 04/2017, s/p neoadjuvant ddAC-TC, left mastectomy and adjuvant radiation. She had excellent response. -most recent right mammogram on 07/26/21 and right axilla [name] on 10/27/21 were benign.

## 2022-02-21 ENCOUNTER — Inpatient Hospital Stay: Payer: Medicare Other

## 2022-02-21 ENCOUNTER — Other Ambulatory Visit: Payer: Self-pay

## 2022-02-21 ENCOUNTER — Inpatient Hospital Stay (HOSPITAL_BASED_OUTPATIENT_CLINIC_OR_DEPARTMENT_OTHER): Payer: Medicare Other | Admitting: Hematology

## 2022-02-21 ENCOUNTER — Encounter: Payer: Self-pay | Admitting: Hematology

## 2022-02-21 VITALS — BP 111/47 | HR 79 | Temp 97.7°F | Resp 16 | Ht 60.0 in | Wt 116.2 lb

## 2022-02-21 DIAGNOSIS — I10 Essential (primary) hypertension: Secondary | ICD-10-CM | POA: Diagnosis not present

## 2022-02-21 DIAGNOSIS — E1169 Type 2 diabetes mellitus with other specified complication: Secondary | ICD-10-CM | POA: Diagnosis not present

## 2022-02-21 DIAGNOSIS — Z5111 Encounter for antineoplastic chemotherapy: Secondary | ICD-10-CM | POA: Diagnosis not present

## 2022-02-21 DIAGNOSIS — C50212 Malignant neoplasm of upper-inner quadrant of left female breast: Secondary | ICD-10-CM

## 2022-02-21 DIAGNOSIS — D46Z Other myelodysplastic syndromes: Secondary | ICD-10-CM

## 2022-02-21 DIAGNOSIS — Z170 Estrogen receptor positive status [ER+]: Secondary | ICD-10-CM

## 2022-02-21 DIAGNOSIS — E785 Hyperlipidemia, unspecified: Secondary | ICD-10-CM

## 2022-02-21 DIAGNOSIS — D649 Anemia, unspecified: Secondary | ICD-10-CM

## 2022-02-21 LAB — CBC WITH DIFFERENTIAL (CANCER CENTER ONLY)
Abs Immature Granulocytes: 0 10*3/uL (ref 0.00–0.07)
Basophils Absolute: 0 10*3/uL (ref 0.0–0.1)
Basophils Relative: 2 %
Eosinophils Absolute: 0.5 10*3/uL (ref 0.0–0.5)
Eosinophils Relative: 21 %
HCT: 22.7 % — ABNORMAL LOW (ref 36.0–46.0)
Hemoglobin: 8 g/dL — ABNORMAL LOW (ref 12.0–15.0)
Lymphocytes Relative: 62 %
Lymphs Abs: 1.4 10*3/uL (ref 0.7–4.0)
MCH: 29.9 pg (ref 26.0–34.0)
MCHC: 35.2 g/dL (ref 30.0–36.0)
MCV: 84.7 fL (ref 80.0–100.0)
Metamyelocytes Relative: 1 %
Monocytes Absolute: 0.1 10*3/uL (ref 0.1–1.0)
Monocytes Relative: 4 %
Neutro Abs: 0.2 10*3/uL — CL (ref 1.7–7.7)
Neutrophils Relative %: 10 %
Platelet Count: 48 10*3/uL — ABNORMAL LOW (ref 150–400)
RBC: 2.68 MIL/uL — ABNORMAL LOW (ref 3.87–5.11)
RDW: 14.6 % (ref 11.5–15.5)
WBC Count: 2.2 10*3/uL — ABNORMAL LOW (ref 4.0–10.5)
nRBC: 0 % (ref 0.0–0.2)
nRBC: 1 /100 WBC — ABNORMAL HIGH

## 2022-02-21 LAB — SAMPLE TO BLOOD BANK

## 2022-02-21 LAB — BASIC METABOLIC PANEL - CANCER CENTER ONLY
Anion gap: 6 (ref 5–15)
BUN: 23 mg/dL (ref 8–23)
CO2: 25 mmol/L (ref 22–32)
Calcium: 9 mg/dL (ref 8.9–10.3)
Chloride: 105 mmol/L (ref 98–111)
Creatinine: 0.66 mg/dL (ref 0.44–1.00)
GFR, Estimated: >60 mL/min (ref >60)
Glucose, Bld: 261 mg/dL — ABNORMAL HIGH (ref 70–99)
Potassium: 3.8 mmol/L (ref 3.5–5.1)
Sodium: 136 mmol/L (ref 135–145)

## 2022-02-21 MED ORDER — AZACITIDINE CHEMO SQ INJECTION
75.0000 mg/m2 | Freq: Once | INTRAMUSCULAR | Status: AC
  Administered 2022-02-21: 112.5 mg via SUBCUTANEOUS
  Filled 2022-02-21: qty 4.5

## 2022-02-21 MED ORDER — ONDANSETRON HCL 8 MG PO TABS
8.0000 mg | ORAL_TABLET | Freq: Once | ORAL | Status: AC
  Administered 2022-02-21: 8 mg via ORAL
  Filled 2022-02-21: qty 1

## 2022-02-21 NOTE — Progress Notes (Signed)
Confirmed Vidaza dose basis (75 mg/m2) w/ [name].  Kennith Center, Pharm.D., CPP 02/21/2022'@10'$ :59 AM

## 2022-02-21 NOTE — Progress Notes (Signed)
Per Burr Medico MD, ok to treat with PLT 48 and pending ANC results.

## 2022-02-21 NOTE — Patient Instructions (Addendum)
Instrucciones al darle de alta: Discharge Instructions Gr[place]ias por elegir al [facility] para brindarle atencin mdica de oncologa y hematologa.   Si usted tiene una cita de laboratorio con el [facility], por favor vaya directamente al [facility] y regstrese en el rea de registro.   Use ropa cmoda y adecuada para tener fcil acceso a las vas del Portacath (acceso venoso de larga duracin) o la lnea PICC (catter central colocado por va perifrica).   Nos esforzamos por ofrecerle tiempo de calidad con su proveedor. Es posible que tenga que volver a programar su cita si llega tarde (15 minutos o ms).  El llegar tarde le afecta a usted y a otros pacientes cuyas citas son posteriores a la suya.  Adems, si usted falta a tres o ms citas sin avisar a la oficina, puede ser retirado(a) de la clnica a discrecin del proveedor.      Para las solicitudes de renovacin de recetas, pida a su farmacia que se ponga en contacto con nuestra oficina y deje que transcurran 72 horas para que se complete el proceso de las renovaciones.    Hoy usted recibi los siguientes agentes de quimioterapia e/o inmunoterapia: Vidaza        Para ayudar a prevenir las nuseas y los vmitos despus de su tratamiento, le recomendamos que tome su medicamento para las nuseas segn las indicaciones.  LOS SNTOMAS QUE DEBEN COMUNICARSE INMEDIATAMENTE SE INDICAN A CONTINUACIN: *FIEBRE SUPERIOR A 100.4 F (38 C) O MS *ESCALOFROS O SUDORACIN *NUSEAS Y VMITOS QUE NO SE CONTROLAN CON EL MEDICAMENTO PARA LAS NUSEAS *DIFICULTAD INUSUAL PARA RESPIRAR  *MORETONES O HEMORRAGIAS NO HABITUALES *PROBLEMAS URINARIOS (dolor o ardor al orinar o frecuencia para orinar) *PROBLEMAS INTESTINALES (diarrea inusual, estreimiento, dolor cerca del ano) SENSIBILIDAD EN LA BOCA Y EN LA GARGANTA CON O SIN LA PRESENCIA DE LCERAS (dolor de garganta, llagas en la boca o dolor de muelas/dientes) ERUPCIN,  HINCHAZN O DOLORES INUSUALES FLUJO VAGINAL INUSUAL O PICAZN/RASQUIA    Los puntos marcados con un asterisco ( *) indican una posible emergencia y debe hacer un seguimiento tan pronto como le sea posible o vaya al Departamento de Emergencias si se le presenta algn problema.  Por favor, muestre la TARJETA DE ADVERTENCIA DE QUIMIOTERAPIA O LA TARJETA DE ADVERTENCIA DE INMUNOTERAPIA al registrarse en el Departamento de Emergencias y a la enfermera de triaje.  Si tiene preguntas despus de su visita o necesita cancelar o volver a programar su cita, por favor pngase en contacto con  [facility]  Dept: [phone] y siga las instrucciones. Las horas de oficina son de 8:00 a.m. a 4:30 p.m. de lunes a viernes. Por favor, tenga en cuenta que los mensajes de voz que se dejan despus de las 4:00 p.m. posiblemente no se devolvern hasta el siguiente da de trabajo.  Cerramos los fines de semana y los das festivos importantes. En todo momento tiene acceso a una enfermera para preguntas urgentes. Por favor, llame al nmero principal de la clnica Dept: [phone] y siga las instrucciones.   Para cualquier pregunta que no sea de carcter urgente, tambin puede ponerse en contacto con su proveedor utilizando MyChart. Ahora ofrecemos visitas electrnicas para cualquier persona mayor de 18 aos que solicite atencin mdica en lnea para los sntomas que no sean urgentes. Para ms detalles vaya a [email or web address].   Tambin puede bajar la aplicacin de MyChart! Vaya a la tienda de aplicaciones, busque "MyChart",   la aplicacin, seleccione [place], e ingrese con su nombre de usuario y la contrasea de Pharmacist, community.

## 2022-02-21 NOTE — Progress Notes (Signed)
[facility]   Telephone:([phone]) [phone] [phone]) [phone]   Clinic Follow up Note   Patient Care Team: [name], NP as PCP - General (Nurse Practitioner) [name], MD as Consulting Physician (Hematology) Fanny Skates, MD as Consulting Physician (General Surgery) Alla Feeling, NP as Nurse Practitioner (Nurse Practitioner) Gery Pray, MD as Consulting Physician (Radiation Oncology) Art Buff, MD as Referring Physician (Hematology and Oncology) Lynne Logan, RN as [place] Management Aris Lot, Ramon Dredge, MD as Referring Physician (Hematology and Oncology)  Date of Service:  02/21/2022  CHIEF COMPLAINT: f/u of MDS, h/o breast cancer      CURRENT THERAPY: Azacitadine injection, days 1-5, started on 11/15/21  (first cycle 7 days)     ASSESSMENT:  Amanda Duarte is a 67 y.o. 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  Her FISH test showed 3 abnormal type, her risk of transforming to acute leukemia is very high, likely in the next 6 to 12 months -She started azacitidine injection on November 15, 2021, she was admitted to hospital after chemo, for severe profound pancytopenia, aspiration pneumonia, required intubation. She has recovered well  -Her blood counts improved after first cycle chemotherapy, likely she responded to treatment. -she started cycle 2 azacitidine on 01/25/2022, will reduce to 4 days for this cycle. If she tolerates well, will change to 5 days from next cyle  -She was evaluated for bone marrow transplant at [facility] last week, she would like to proceed. I again discussed potential serious AEs from BMT and the option of continue current therapy and preserve BMT when she progress. After a lengthy discussion, she agrees to  continue current therapy.   Malignant neoplasm of upper-inner quadrant of left breast in female, estrogen receptor positive ([place]) -diagnosed in 04/2017, s/p neoadjuvant ddAC-TC, left mastectomy and adjuvant radiation. She had excellent response. -most recent right mammogram on 07/26/21 and right axilla [name] on 10/27/21 were benign.  Type 2 diabetes mellitus with hyperlipidemia ([facility]) -Continue medication and follow-up with PCP  Essential hypertension -Continue medication and follow-up with PCP   PLAN: - proceed to C3 Vidaza today, will increase dose to '75mg'$ /m2 for 5 days  - labs reviewed - discuss bone marrow biopsy and transplant, will hold it for now  - f/u in 2 and 4 weeks, I encourage to bring her daughter if possible - start a B12 complex supplement - No blood transfusion needed today  - Repeat labs Thursday to check blood count -lab and blood transfusion if needed weekly    SUMMARY OF ONCOLOGIC HISTORY: Oncology History Overview Note  Cancer Staging Malignant neoplasm of upper-inner quadrant of left breast in female, estrogen receptor positive ([place]) Staging form: Breast, AJCC 8th Edition - Clinical stage from 05/01/2017: Stage Unknown (cTX, cN2, cM0, G3, ER-, PR-, HER2-) - Signed by [name], MD on 11/01/2017 - Pathologic stage from 11/20/2017: No Stage Recommended (ypT0, pN34m, cM0, GX, ER-, PR-, HER2-) - Signed by [name] on 12/10/2017     Malignant neoplasm of upper-inner quadrant of left breast in female, estrogen receptor positive ([place]  04/28/2017 Mammogram   IMPRESSION: Two adjacent masses/enlarged lymph nodes in the LOWER LEFT axilla, the largest measuring 2.1 cm. Tissue sampling of 1 of these is recommended to exclude malignancy/lymphoma. No mammographic evidence of breast malignancy bilaterally.  05/01/2017 Initial Biopsy   Diagnosis 05/01/17 Lymph node, needle/core biopsy, low left inferior axillary - METASTATIC POORLY DIFFERENTIATED CARCINOMA TO A LYMPH NODE.  SEE NOTE.   05/01/2017 Cancer Staging   Staging form: Breast, AJCC 8th Edition - Clinical stage from 05/01/2017: Stage Unknown (cTX, cN1, cM0, G3, ER-, PR-, HER2-) - Signed by [name], MD on 06/02/2017    05/01/2017 Receptors her2   Lymph Node Biopsy:  HER2-Negative  PR-Negative  ER- Negative    05/10/2017 Imaging   MR Breast W WO Contrast 05/10/17 IMPRESSION: No MRI evidence of malignancy in the right breast. Area of week stippled non mass enhancement in the left breast upper inner quadrant. Separate area of thin linear non mass enhancement in the subareolar left breast. Three grossly abnormal left axillary lymph nodes, and more than 4 less than 1 cm indeterminate left axillary lymph nodes. No evidence of right axillary lymphadenopathy.   05/17/2017 Initial Biopsy   Diagnosis 05/17/17 1. 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 2. Breast, left, needle core biopsy, upper inner post MR enhancement - MICROSCOPIC FOCUS OF DUCTAL CARCINOMA IN SITU.   05/17/2017 Receptors her2   Left Breast Biopsy:  ER-Negative PR-Negative HER2-Negative   05/22/2017 Initial Diagnosis   Malignant neoplasm of upper-inner quadrant of left breast in female, estrogen receptor positive ([place])   06/01/2017 Imaging   [place] Scan 06/01/17 IMPRESSION: No definite scintigraphic evidence of osseous metastatic disease.   Posttraumatic and postsurgical uptake at the LEFT knee.   Nonspecific soft tissue distribution of tracer at the LEFT thigh, could represent contusion, hemorrhage, soft tissue edema, or soft tissue calcifications such as from heterotopic calcification and myositis ossificans; recommend clinical correlation and consider dedicated LEFT femoral radiographs.   Single focus of nonspecific increased tracer localization at the lateral LEFT orbit.     06/01/2017 Imaging   06/01/2017 Bone Scan IMPRESSION: No definite  scintigraphic evidence of osseous metastatic disease.   Posttraumatic and postsurgical uptake at the LEFT knee.   Nonspecific soft tissue distribution of tracer at the LEFT thigh, could represent contusion, hemorrhage, soft tissue edema, or soft tissue calcifications such as from heterotopic calcification and myositis ossificans; recommend clinical correlation and consider dedicated LEFT femoral radiographs.   Single focus of nonspecific increased tracer localization at the lateral LEFT orbit.   06/09/2017 -  Chemotherapy   ddAC every 2 weeks for 4 weeks starting 06/09/17-07/21/17 followed by weekly [place] and taxol for 12 weeks 08/04/17-10/20/17.    10/24/2017 Imaging   Breast MRI B/l 10/24/17 IMPRESSION: 1. No residual enhancement in the LEFT breast following neoadjuvant treatment. 2. Significantly smaller LEFT axillary lymph nodes, largest now measuring 1.4 centimeters. The remainder of LEFT axillary lymph nodes demonstrate normal fatty hila. RECOMMENDATION: Treatment plan for known LEFT breast cancer.   11/20/2017 Cancer Staging   Staging form: Breast, AJCC 8th Edition - Pathologic stage from 11/20/2017: No Stage Recommended (ypT0, pN74m, cM0, GX, ER-, PR-, HER2-) - Signed by [name] on 12/10/2017   11/20/2017 Surgery   Left mastectomy and SLN biopsy by Dr. IDalbert Batman   11/20/2017 Pathology Results   Breast, modified radical mastectomy , left - MICROSCOPIC FOCI OF RESIDUAL METASTATIC CARCINOMA INVOLVING TWO OF EIGHT LYMPH NODES (2/8); LARGEST CONTINUOUS FOCUS MEASURES 0.1 CM. - NO EVIDENCE OF RESIDUAL CARCINOMA IN THE MASTECTOMY SPECIMEN - MARKED THERAPY-RELATED CHANGES, INCLUDING DENSE HYALIN FIBROSIS     11/20/2017 Receptors her2   ER-  PR- HER2- (IHC 1+)   01/02/2018 - 02/14/2018 Radiation Therapy   Adjuvant Radiation 01/02/18 - 02/14/18   07/31/2018 Imaging   Baseline DEXA 07/31/18  ASSESSMENT: The BMD measured at AP Spine L1-L4 is 0.973 g/cm2 with a T-score  of -1.7.   This patient is considered OSTEOPENIC according to [facility]) criteria. The scan quality is good.   Site Region Measured Date Measured Age YA T-score BMD Significant CHANGE   AP Spine  L1-L4      07/31/2018    63.1         -1.7    0.973 g/cm2   DualFemur Neck Left  07/31/2018    63.1         -1.5    0.828 g/cm2   DualFemur Total Mean 07/31/2018    63.1         0.1     1.016 g/cm2   11/05/2018 Survivorship   Per [name], NP    11/15/2021 -  Chemotherapy   Patient is on Treatment Plan : MYELODYSPLASIA  Azacitidine SQ D1-7 q28d        INTERVAL HISTORY:  Amanda Duarte is here for a follow up of MDS, h/o breast cancer.  She was last seen by Dr. [name] on 02/10/2022.  She presents to the clinic with interpreter. Pt letting physician know about prior visit with Dr. Aris Lot, discussed issues with bone marrow transplant and biopsy. 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  MEDICAL HISTORY:  Past Medical History:  Diagnosis Date   Cancer ([place]) 03/2017   left breast cancer   Diabetes mellitus without complication ([place])    Hyperlipidemia    Hypertension    Malignant neoplasm of left breast ([place])    Stroke (cerebrum) ([place])    Tibial plateau fracture, left    04-13-17 had ORIF    SURGICAL HISTORY: Past Surgical History:  Procedure Laterality Date   MASTECTOMY Left 2019   MASTECTOMY MODIFIED RADICAL Left 11/20/2017   Procedure: LEFT MODIFIED RADICAL MASTECTOMY;  Surgeon: Fanny Skates, MD;  Location: [facility];  Service: General;  Laterality: Left;   ORIF TIBIA PLATEAU Left 04/13/2017   Procedure: OPEN REDUCTION INTERNAL FIXATION (ORIF) TIBIAL PLATEAU;  Surgeon: Shona Needles, MD;  Location: [place];  Service: Orthopedics;  Laterality: Left;   PORT-A-CATH REMOVAL Right 11/20/2017   Procedure: REMOVAL PORT-A-CATH;  Surgeon: Fanny Skates, MD;  Location: [facility] [place];   Service: General;  Laterality: Right;   PORTACATH PLACEMENT Right 05/31/2017   Procedure: INSERTION PORT-A-CATH;  Surgeon: Fanny Skates, MD;  Location: [facility];  Service: General;  Laterality: Right;    I have reviewed the social history and family history with the patient and they are unchanged from previous note.  ALLERGIES:  is allergic to victoza [liraglutide].  MEDICATIONS:  Current Outpatient Medications  Medication Sig Dispense Refill   insulin lispro (HUMALOG KWIKPEN) 100 UNIT/ML KwikPen Inject 10 Units into the skin 3 (three) times daily. 15 mL 1   Accu-Chek Softclix Lancets lancets Use to check blood sugar 3 times daily. E11.65 100 each 3   amLODipine (NORVASC) 5 MG tablet Take 1 tablet (5 mg total) by mouth daily. For blood pressure 90 tablet 1   atorvastatin (LIPITOR) 40 MG tablet Take 1 tablet (40 mg total) by mouth daily. For cholesterol 90 tablet 2   Blood Glucose Monitoring Suppl (  ACCU-CHEK GUIDE) w/Device KIT use kit to check blood glucose three times daily 1 kit 0   Blood Pressure Monitor DEVI Please provide patient with insurance approved blood pressure monitor 1 each 0   citalopram (CELEXA) 20 MG tablet Take 1 tablet (20 mg total) by mouth daily. For depression (Patient not taking: Reported on 01/11/2022) 90 tablet 3   Continuous Blood Gluc Receiver (FREESTYLE LIBRE READER) DEVI Monitor blood glucose levels 5-6 times per day E11.65 (Patient not taking: Reported on 01/11/2022) 1 each 0   Continuous Blood Gluc Sensor (FREESTYLE LIBRE 2 SENSOR) MISC Monitor blood glucose levels 5-6 times per day E11.65 (Patient not taking: Reported on 01/11/2022) 3 each 6   fenofibrate (TRICOR) 145 MG tablet Take 1 tablet (145 mg total) by mouth daily. For cholesterol 90 tablet 2   fluticasone (FLONASE) 50 MCG/ACT nasal spray Place 2 sprays into both nostrils daily. 16 g 6   glucose blood (ACCU-CHEK GUIDE) test strip Use to check blood sugar 3 times daily. E11.65 100 each 11    hydrOXYzine (ATARAX) 10 MG tablet Take 1 tablet (10 mg total) by mouth 3 (three) times daily as needed for anxiety. 60 tablet 3   insulin glargine (LANTUS) 100 UNIT/ML Solostar Pen Inject 20 Units into the skin 2 (two) times daily. 36 mL 1   Insulin Pen Needle 31G X 5 MM MISC Inject 1 Device into the skin QID. For use with insulin pens 200 each 6   Insulin Syringe-Needle U-100 30G X 5/16" 1 ML MISC Use as directed to inject into the skin 3 times daily. 200 each 6   levofloxacin (LEVAQUIN) 500 MG tablet Take 1 tablet (500 mg total) by mouth daily. 20 tablet 0   losartan (COZAAR) 50 MG tablet Take 1 tablet (50 mg total) by mouth daily. For blood pressure 90 tablet 1   ondansetron (ZOFRAN) 8 MG tablet Take 1 tablet (8 mg total) by mouth every 8 (eight) hours as needed for nausea or vomiting. (Patient taking differently: Take 8 mg by mouth as needed for nausea or vomiting.) 30 tablet 1   pantoprazole (PROTONIX) 40 MG tablet Take 1 tablet (40 mg total) by mouth daily at 6 (six) AM. For heartburn 90 tablet 1   potassium chloride SA (KLOR-CON M) 20 MEQ tablet Take 1 tablet (20 mEq total) by mouth daily. 30 tablet 3   prochlorperazine (COMPAZINE) 10 MG tablet Take 1 tablet (10 mg total) by mouth every 6 (six) hours as needed for nausea or vomiting. 30 tablet 1   No current facility-administered medications for this visit.   Facility-Administered Medications Ordered in Other Visits  Medication Dose Route Frequency Provider Last Rate Last Admin   heparin lock flush 100 unit/mL  500 Units Intracatheter Once PRN [name], MD       sodium chloride flush (NS) 0.9 % injection 10 mL  10 mL Intracatheter PRN [name], MD        PHYSICAL EXAMINATION: ECOG PERFORMANCE STATUS: 1 - Symptomatic but completely ambulatory  Vitals:   02/21/22 0947  BP: (!) 111/47  Pulse: 79  Resp: 16  Temp: 97.7 F (36.5 C)  SpO2: 99%   Wt Readings from Last 3 Encounters:  02/21/22 116 lb 3.2 oz (52.7 kg)  02/10/22 115  lb 3.2 oz (52.3 kg)  01/27/22 112 lb 4.8 oz (50.9 kg)     GENERAL:alert, no distress and comfortable SKIN: skin color normal, no rashes or significant lesions EYES: normal, Conjunctiva are pink and  non-injected, sclera clear  NEURO: alert & oriented x 3 with fluent speech     LABORATORY DATA:  I have reviewed the data as listed    Latest Ref Rng & Units 02/21/2022    9:14 AM 02/18/2022    7:56 AM 02/10/2022    7:50 AM  CBC  WBC 4.0 - 10.5 K/uL 2.2  2.6  2.9   Hemoglobin 12.0 - 15.0 g/dL 8.0  7.6  8.8   Hematocrit 36.0 - 46.0 % 22.7  22.0  25.4   Platelets 150 - 400 K/uL 48  77  63         Latest Ref Rng & Units 02/21/2022    9:14 AM 02/18/2022    7:56 AM 02/10/2022    7:50 AM  CMP  Glucose 70 - 99 mg/dL 261  125  99   BUN 8 - 23 mg/dL '23  19  16   '$ Creatinine 0.44 - 1.00 mg/dL 0.66  0.67  0.59   Sodium 135 - 145 mmol/L 136  139  141   Potassium 3.5 - 5.1 mmol/L 3.8  3.8  3.9   Chloride 98 - 111 mmol/L 105  107  108   CO2 22 - 32 mmol/L '25  26  27   '$ Calcium 8.9 - 10.3 mg/dL 9.0  9.1  9.4   Total Protein 6.5 - 8.1 g/dL  7.5  7.0   Total Bilirubin 0.3 - 1.2 mg/dL  0.6  0.6   Alkaline Phos 38 - 126 U/L  94  76   AST 15 - 41 U/L  12  13   ALT 0 - 44 U/L  5  7       RADIOGRAPHIC STUDIES: I have personally reviewed the radiological images as listed and agreed with the findings in the report. No results found.    No orders of the defined types were placed in this encounter.  All questions were answered. The patient knows to call the clinic with any problems, questions or concerns. No barriers to learning was detected. The total time spent in the appointment was 30 minutes.     [name], MD 02/21/2022   I, Maurine Simmering, CMA, am acting as scribe for [name], MD.   I have reviewed the above documentation for accuracy and completeness, and I agree with the above.

## 2022-02-22 ENCOUNTER — Inpatient Hospital Stay: Payer: Medicare Other

## 2022-02-22 VITALS — BP 108/33 | HR 80 | Temp 98.0°F | Resp 18

## 2022-02-22 DIAGNOSIS — C50212 Malignant neoplasm of upper-inner quadrant of left female breast: Secondary | ICD-10-CM

## 2022-02-22 DIAGNOSIS — Z5111 Encounter for antineoplastic chemotherapy: Secondary | ICD-10-CM | POA: Diagnosis not present

## 2022-02-22 MED ORDER — AZACITIDINE CHEMO SQ INJECTION
75.0000 mg/m2 | Freq: Once | INTRAMUSCULAR | Status: AC
  Administered 2022-02-22: 112.5 mg via SUBCUTANEOUS
  Filled 2022-02-22: qty 4.5

## 2022-02-22 MED ORDER — ONDANSETRON HCL 8 MG PO TABS
8.0000 mg | ORAL_TABLET | Freq: Once | ORAL | Status: AC
  Administered 2022-02-22: 8 mg via ORAL
  Filled 2022-02-22: qty 1

## 2022-02-23 ENCOUNTER — Inpatient Hospital Stay: Payer: Medicare Other

## 2022-02-23 ENCOUNTER — Telehealth: Payer: Self-pay | Admitting: Hematology

## 2022-02-23 ENCOUNTER — Other Ambulatory Visit: Payer: Self-pay

## 2022-02-23 VITALS — BP 119/49 | HR 81 | Temp 97.8°F | Resp 17

## 2022-02-23 DIAGNOSIS — Z5111 Encounter for antineoplastic chemotherapy: Secondary | ICD-10-CM | POA: Diagnosis not present

## 2022-02-23 DIAGNOSIS — Z170 Estrogen receptor positive status [ER+]: Secondary | ICD-10-CM

## 2022-02-23 MED ORDER — AZACITIDINE CHEMO SQ INJECTION
75.0000 mg/m2 | Freq: Once | INTRAMUSCULAR | Status: AC
  Administered 2022-02-23: 112.5 mg via SUBCUTANEOUS
  Filled 2022-02-23: qty 4.5

## 2022-02-23 MED ORDER — ONDANSETRON HCL 8 MG PO TABS
8.0000 mg | ORAL_TABLET | Freq: Once | ORAL | Status: AC
  Administered 2022-02-23: 8 mg via ORAL
  Filled 2022-02-23: qty 1

## 2022-02-23 NOTE — Patient Instructions (Signed)
Instrucciones al darle de alta: Discharge Instructions Gracias por elegir al [facility] para brindarle atencin mdica de oncologa y hematologa.   Si usted tiene una cita de laboratorio con el [facility], por favor vaya directamente al [facility] y regstrese en el rea de registro.   Use ropa cmoda y adecuada para tener fcil acceso a las vas del Portacath (acceso venoso de larga duracin) o la lnea PICC (catter central colocado por va perifrica).   Nos esforzamos por ofrecerle tiempo de calidad con su proveedor. Es posible que tenga que volver a programar su cita si llega tarde (15 minutos o ms).  El llegar tarde le afecta a usted y a otros pacientes cuyas citas son posteriores a la suya.  Adems, si usted falta a tres o ms citas sin avisar a la oficina, puede ser retirado(a) de la clnica a discrecin del proveedor.      Para las solicitudes de renovacin de recetas, pida a su farmacia que se ponga en contacto con nuestra oficina y deje que transcurran 72 horas para que se complete el proceso de las renovaciones.    Hoy usted recibi los siguientes agentes de quimioterapia e/o inmunoterapia: Vidaza        Para ayudar a prevenir las nuseas y los vmitos despus de su tratamiento, le recomendamos que tome su medicamento para las nuseas segn las indicaciones.  LOS SNTOMAS QUE DEBEN COMUNICARSE INMEDIATAMENTE SE INDICAN A CONTINUACIN: *FIEBRE SUPERIOR A 100.4 F (38 C) O MS *ESCALOFROS O SUDORACIN *NUSEAS Y VMITOS QUE NO SE CONTROLAN CON EL MEDICAMENTO PARA LAS NUSEAS *DIFICULTAD INUSUAL PARA RESPIRAR  *MORETONES O HEMORRAGIAS NO HABITUALES *PROBLEMAS URINARIOS (dolor o ardor al orinar o frecuencia para orinar) *PROBLEMAS INTESTINALES (diarrea inusual, estreimiento, dolor cerca del ano) SENSIBILIDAD EN LA BOCA Y EN LA GARGANTA CON O SIN LA PRESENCIA DE LCERAS (dolor de garganta, llagas en la boca o dolor de muelas/dientes) ERUPCIN,  HINCHAZN O DOLORES INUSUALES FLUJO VAGINAL INUSUAL O PICAZN/RASQUIA    Los puntos marcados con un asterisco ( *) indican una posible emergencia y debe hacer un seguimiento tan pronto como le sea posible o vaya al Departamento de Emergencias si se le presenta algn problema.  Por favor, muestre la TARJETA DE ADVERTENCIA DE QUIMIOTERAPIA O LA TARJETA DE ADVERTENCIA DE INMUNOTERAPIA al registrarse en el Departamento de Emergencias y a la enfermera de triaje.  Si tiene preguntas despus de su visita o necesita cancelar o volver a programar su cita, por favor pngase en contacto con [facility]  Dept: [phone] y siga las instrucciones. Las horas de oficina son de 8:00 a.m. a 4:30 p.m. de lunes a viernes. Por favor, tenga en cuenta que los mensajes de voz que se dejan despus de las 4:00 p.m. posiblemente no se devolvern hasta el siguiente da de trabajo.  Cerramos los fines de semana y los das festivos importantes. En todo momento tiene acceso a una enfermera para preguntas urgentes. Por favor, llame al nmero principal de la clnica Dept: [phone] y siga las instrucciones.   Para cualquier pregunta que no sea de carcter urgente, tambin puede ponerse en contacto con su proveedor utilizando MyChart. Ahora ofrecemos visitas electrnicas para cualquier persona mayor de 18 aos que solicite atencin mdica en lnea para los sntomas que no sean urgentes. Para ms detalles vaya a [email or web address].   Tambin puede bajar la aplicacin de MyChart! Vaya a la tienda de aplicaciones, busque "MyChart",   aplicacin, seleccione [place], e ingrese con su nombre de usuario y la contrasea de Pharmacist, community.

## 2022-02-23 NOTE — Telephone Encounter (Signed)
Gave patient updated calendar while in infusion

## 2022-02-24 ENCOUNTER — Inpatient Hospital Stay: Payer: Medicare Other | Attending: Hematology

## 2022-02-24 ENCOUNTER — Inpatient Hospital Stay: Payer: Medicare Other

## 2022-02-24 VITALS — BP 124/35 | HR 83 | Temp 98.0°F | Resp 16

## 2022-02-24 DIAGNOSIS — Z5111 Encounter for antineoplastic chemotherapy: Secondary | ICD-10-CM | POA: Diagnosis present

## 2022-02-24 DIAGNOSIS — Z170 Estrogen receptor positive status [ER+]: Secondary | ICD-10-CM

## 2022-02-24 DIAGNOSIS — D469 Myelodysplastic syndrome, unspecified: Secondary | ICD-10-CM | POA: Insufficient documentation

## 2022-02-24 DIAGNOSIS — Z79899 Other long term (current) drug therapy: Secondary | ICD-10-CM | POA: Insufficient documentation

## 2022-02-24 MED ORDER — AZACITIDINE CHEMO SQ INJECTION
75.0000 mg/m2 | Freq: Once | INTRAMUSCULAR | Status: AC
  Administered 2022-02-24: 112.5 mg via SUBCUTANEOUS
  Filled 2022-02-24: qty 4.5

## 2022-02-24 MED ORDER — ONDANSETRON HCL 8 MG PO TABS
8.0000 mg | ORAL_TABLET | Freq: Once | ORAL | Status: AC
  Administered 2022-02-24: 8 mg via ORAL
  Filled 2022-02-24: qty 1

## 2022-02-24 NOTE — Patient Instructions (Signed)
Instrucciones al darle de alta: Discharge Instructions Gr[place]ias por elegir al [facility] para br[place]arle atencin mdica de oncologa y hematologa.   Si usted tiene una cita de laboratorio con el [facility], por favor vaya directamente al [facility] y regstrese en el rea de registro.   Use ropa cmoda y adecuada para tener fcil acceso a las vas del Portacath (acceso venoso de larga duracin) o la lnea PICC (catter central colocado por va perifrica).   Nos esforzamos por ofrecerle tiempo de calidad con su proveedor. Es posible que tenga que volver a programar su cita si llega tarde (15 minutos o ms).  El llegar tarde le afecta a usted y a otros pacientes cuyas citas son posteriores a la suya.  Adems, si usted falta a tres o ms citas sin avisar a la oficina, puede ser retirado(a) de la clnica a discrecin del proveedor.      Para las solicitudes de renovacin de recetas, pida a su farmacia que se ponga en contacto con nuestra oficina y deje que transcurran 72 horas para que se complete el proceso de las renovaciones.    Hoy usted recibi los siguientes agentes de quimioterapia e/o inmunoterapia: Vidaza        Para ayudar a prevenir las nuseas y los vmitos despus de su tratamiento, le recomendamos que tome su medicamento para las nuseas segn las indicaciones.  LOS SNTOMAS QUE DEBEN COMUNICARSE INMEDIATAMENTE SE INDICAN A CONTINUACIN: *FIEBRE SUPERIOR A 100.4 F (38 C) O MS *ESCALOFROS O SUDORACIN *NUSEAS Y VMITOS QUE NO SE CONTROLAN CON EL MEDICAMENTO PARA LAS NUSEAS *DIFICULTAD INUSUAL PARA RESPIRAR  *MORETONES O HEMORRAGIAS NO HABITUALES *PROBLEMAS URINARIOS (dolor o ardor al orinar o frecuencia para orinar) *PROBLEMAS INTESTINALES (diarrea inusual, estreimiento, dolor cerca del ano) SENSIBILIDAD EN LA BOCA Y EN LA GARGANTA CON O SIN LA PRESENCIA DE LCERAS (dolor de garganta, llagas en la boca o dolor de muelas/dientes) ERUPCIN,  HINCHAZN O DOLORES INUSUALES FLUJO VAGINAL INUSUAL O PICAZN/RASQUIA    Los puntos marcados con un asterisco ( *) indican una posible emergencia y debe hacer un seguimiento Natoya Viscomi pronto como le sea posible o vaya al Departamento de Emergencias si se le presenta algn problema.  Por favor, muestre la TARJETA DE ADVERTENCIA DE QUIMIOTERAPIA O LA TARJETA DE ADVERTENCIA DE INMUNOTERAPIA al registrarse en el Departamento de Emergencias y a la enfermera de triaje.  Si tiene preguntas despus de su visita o necesita cancelar o volver a programar su cita, por favor pngase en contacto con [facility]  Dept: [phone] y siga las instrucciones. Las horas de oficina son de 8:00 a.m. a 4:30 p.m. de lunes a viernes. Por favor, tenga en cuenta que los mensajes de voz que se dejan despus de las 4:00 p.m. posiblemente no se devolvern hasta el siguiente da de trabajo.  Cerramos los fines de semana y los das festivos importantes. En todo momento tiene acceso a una enfermera para preguntas urgentes. Por favor, llame al nmero principal de la clnica Dept: [phone] y siga las instrucciones.   Para cualquier pregunta que no sea de carcter urgente, tambin puede ponerse en contacto con su proveedor utilizando MyChart. Ahora ofrecemos visitas electrnicas para cualquier persona mayor de 18 aos que solicite atencin mdica en lnea para los sntomas que no sean urgentes. Para ms detalles vaya a [email or web address].   Tambin puede bajar la aplicacin de MyChart! Vaya a la tienda de aplicaciones, busque "MyChart",   aplicacin, seleccione [place], e ingrese con su nombre de usuario y la contrasea de Pharmacist, community.

## 2022-02-24 NOTE — Progress Notes (Signed)
Pt presented to infusion today w/ new c/o dry/scaly rash on her back, chest, and abdomen. Pt stated "the rash is itchy". Pt informed Genia Hotter and this RN that she noticed it a few days ago and was treating it with rubbing alcohol.  Kathlee Nations RN educated the Pt to not use rubbing alcohol on the rash.  This RN made [name] aware. [name] assess Pt chairside.  [name] recommended the Pt use a unscented lotion or cortisone cream on the rash to help with the itchiness and dry skin.  This RN educated Pt on MD's recommendations. Pt verbalized understanding via Almyra Free the interpreter.

## 2022-02-25 ENCOUNTER — Inpatient Hospital Stay: Payer: Medicare Other

## 2022-02-25 ENCOUNTER — Other Ambulatory Visit: Payer: Self-pay

## 2022-02-25 VITALS — BP 123/44 | HR 88 | Temp 99.4°F | Resp 18

## 2022-02-25 DIAGNOSIS — Z5111 Encounter for antineoplastic chemotherapy: Secondary | ICD-10-CM | POA: Diagnosis not present

## 2022-02-25 DIAGNOSIS — Z170 Estrogen receptor positive status [ER+]: Secondary | ICD-10-CM

## 2022-02-25 MED ORDER — AZACITIDINE CHEMO SQ INJECTION
75.0000 mg/m2 | Freq: Once | INTRAMUSCULAR | Status: AC
  Administered 2022-02-25: 112.5 mg via SUBCUTANEOUS
  Filled 2022-02-25: qty 4.5

## 2022-02-25 MED ORDER — ONDANSETRON HCL 8 MG PO TABS
8.0000 mg | ORAL_TABLET | Freq: Once | ORAL | Status: AC
  Administered 2022-02-25: 8 mg via ORAL
  Filled 2022-02-25: qty 1

## 2022-02-25 NOTE — Patient Instructions (Signed)
Instrucciones al darle de alta: Discharge Instructions Gracias por elegir al [facility] para brindarle atencin mdica de oncologa y hematologa.   Si usted tiene una cita de laboratorio con el [facility], por favor vaya directamente al [facility] y regstrese en el rea de registro.   Use ropa cmoda y adecuada para tener fcil acceso a las vas del Portacath (acceso venoso de larga duracin) o la lnea PICC (catter central colocado por va perifrica).   Nos esforzamos por ofrecerle tiempo de calidad con su proveedor. Es posible que tenga que volver a programar su cita si llega tarde (15 minutos o ms).  El llegar tarde le afecta a usted y a otros pacientes cuyas citas son posteriores a la suya.  Adems, si usted falta a tres o ms citas sin avisar a la oficina, puede ser retirado(a) de la clnica a discrecin del proveedor.      Para las solicitudes de renovacin de recetas, pida a su farmacia que se ponga en contacto con nuestra oficina y deje que transcurran 72 horas para que se complete el proceso de las renovaciones.    Hoy usted recibi los siguientes agentes de quimioterapia e/o inmunoterapia: Vidaza        Para ayudar a prevenir las nuseas y los vmitos despus de su tratamiento, le recomendamos que tome su medicamento para las nuseas segn las indicaciones.  LOS SNTOMAS QUE DEBEN COMUNICARSE INMEDIATAMENTE SE INDICAN A CONTINUACIN: *FIEBRE SUPERIOR A 100.4 F (38 C) O MS *ESCALOFROS O SUDORACIN *NUSEAS Y VMITOS QUE NO SE CONTROLAN CON EL MEDICAMENTO PARA LAS NUSEAS *DIFICULTAD INUSUAL PARA RESPIRAR  *MORETONES O HEMORRAGIAS NO HABITUALES *PROBLEMAS URINARIOS (dolor o ardor al orinar o frecuencia para orinar) *PROBLEMAS INTESTINALES (diarrea inusual, estreimiento, dolor cerca del ano) SENSIBILIDAD EN LA BOCA Y EN LA GARGANTA CON O SIN LA PRESENCIA DE LCERAS (dolor de garganta, llagas en la boca o dolor de muelas/dientes) ERUPCIN,  HINCHAZN O DOLORES INUSUALES FLUJO VAGINAL INUSUAL O PICAZN/RASQUIA    Los puntos marcados con un asterisco ( *) indican una posible emergencia y debe hacer un seguimiento tan pronto como le sea posible o vaya al Departamento de Emergencias si se le presenta algn problema.  Por favor, muestre la TARJETA DE ADVERTENCIA DE QUIMIOTERAPIA O LA TARJETA DE ADVERTENCIA DE INMUNOTERAPIA al registrarse en el Departamento de Emergencias y a la enfermera de triaje.  Si tiene preguntas despus de su visita o necesita cancelar o volver a programar su cita, por favor pngase en contacto con [facility]  Dept: [phone] y siga las instrucciones. Las horas de oficina son de 8:00 a.m. a 4:30 p.m. de lunes a viernes. Por favor, tenga en cuenta que los mensajes de voz que se dejan despus de las 4:00 p.m. posiblemente no se devolvern hasta el siguiente da de trabajo.  Cerramos los fines de semana y los das festivos importantes. En todo momento tiene acceso a una enfermera para preguntas urgentes. Por favor, llame al nmero principal de la clnica Dept: [phone] y siga las instrucciones.   Para cualquier pregunta que no sea de carcter urgente, tambin puede ponerse en contacto con su proveedor utilizando MyChart. Ahora ofrecemos visitas electrnicas para cualquier persona mayor de 18 aos que solicite atencin mdica en lnea para los sntomas que no sean urgentes. Para ms detalles vaya a [email or web address].   Tambin puede bajar la aplicacin de MyChart! Vaya a la tienda de aplicaciones, busque "MyChart",   abra la aplicacin, seleccione [place], e ingrese con su nombre de usuario y la contrasea de MyChart. 

## 2022-02-28 ENCOUNTER — Other Ambulatory Visit: Payer: Self-pay

## 2022-02-28 ENCOUNTER — Inpatient Hospital Stay: Payer: Medicare Other

## 2022-02-28 DIAGNOSIS — Z170 Estrogen receptor positive status [ER+]: Secondary | ICD-10-CM

## 2022-02-28 DIAGNOSIS — Z5111 Encounter for antineoplastic chemotherapy: Secondary | ICD-10-CM | POA: Diagnosis not present

## 2022-02-28 DIAGNOSIS — D649 Anemia, unspecified: Secondary | ICD-10-CM

## 2022-02-28 DIAGNOSIS — D46Z Other myelodysplastic syndromes: Secondary | ICD-10-CM

## 2022-02-28 LAB — CBC WITH DIFFERENTIAL (CANCER CENTER ONLY)
Abs Immature Granulocytes: 0 10*3/uL (ref 0.00–0.07)
Basophils Absolute: 0 10*3/uL (ref 0.0–0.1)
Basophils Relative: 0 %
Eosinophils Absolute: 0.7 10*3/uL — ABNORMAL HIGH (ref 0.0–0.5)
Eosinophils Relative: 21 %
HCT: 22.6 % — ABNORMAL LOW (ref 36.0–46.0)
Hemoglobin: 7.9 g/dL — ABNORMAL LOW (ref 12.0–15.0)
Lymphocytes Relative: 69 %
Lymphs Abs: 2.2 10*3/uL (ref 0.7–4.0)
MCH: 29.6 pg (ref 26.0–34.0)
MCHC: 35 g/dL (ref 30.0–36.0)
MCV: 84.6 fL (ref 80.0–100.0)
Monocytes Absolute: 0.1 10*3/uL (ref 0.1–1.0)
Monocytes Relative: 2 %
Neutro Abs: 0.3 10*3/uL — CL (ref 1.7–7.7)
Neutrophils Relative %: 8 %
Platelet Count: 83 10*3/uL — ABNORMAL LOW (ref 150–400)
RBC: 2.67 MIL/uL — ABNORMAL LOW (ref 3.87–5.11)
RDW: 14.1 % (ref 11.5–15.5)
WBC Count: 3.2 10*3/uL — ABNORMAL LOW (ref 4.0–10.5)
nRBC: 0 % (ref 0.0–0.2)

## 2022-02-28 LAB — CMP (CANCER CENTER ONLY)
ALT: 5 U/L (ref 0–44)
AST: 9 U/L — ABNORMAL LOW (ref 15–41)
Albumin: 3.6 g/dL (ref 3.5–5.0)
Alkaline Phosphatase: 113 U/L (ref 38–126)
Anion gap: 6 (ref 5–15)
BUN: 17 mg/dL (ref 8–23)
CO2: 27 mmol/L (ref 22–32)
Calcium: 9.3 mg/dL (ref 8.9–10.3)
Chloride: 104 mmol/L (ref 98–111)
Creatinine: 0.64 mg/dL (ref 0.44–1.00)
GFR, Estimated: >60 mL/min (ref >60)
Glucose, Bld: 173 mg/dL — ABNORMAL HIGH (ref 70–99)
Potassium: 4 mmol/L (ref 3.5–5.1)
Sodium: 137 mmol/L (ref 135–145)
Total Bilirubin: 0.5 mg/dL (ref 0.3–1.2)
Total Protein: 7 g/dL (ref 6.5–8.1)

## 2022-02-28 LAB — PREPARE RBC (CROSSMATCH)

## 2022-02-28 LAB — SAMPLE TO BLOOD BANK

## 2022-02-28 MED ORDER — ACETAMINOPHEN 325 MG PO TABS
650.0000 mg | ORAL_TABLET | Freq: Once | ORAL | Status: AC
  Administered 2022-02-28: 650 mg via ORAL
  Filled 2022-02-28: qty 2

## 2022-02-28 MED ORDER — SODIUM CHLORIDE 0.9% IV SOLUTION
250.0000 mL | Freq: Once | INTRAVENOUS | Status: AC
  Administered 2022-02-28: 250 mL via INTRAVENOUS

## 2022-02-28 MED ORDER — DIPHENHYDRAMINE HCL 25 MG PO CAPS
25.0000 mg | ORAL_CAPSULE | Freq: Once | ORAL | Status: AC
  Administered 2022-02-28: 25 mg via ORAL
  Filled 2022-02-28: qty 1

## 2022-02-28 NOTE — Patient Instructions (Signed)
Transfusin de sangre en los adultos, cuidados posteriores Blood Transfusion, Adult, Care After Despus de una transfusin de sangre, es comn presentar lo siguiente: Moretones y dolor en el lugar de la va intravenosa (IV). Dolor de cabeza. Siga estas instrucciones en su casa: El mdico podr darle ms instrucciones. Si tiene problemas, llame al mdico. Cuidados del lugar de la insercin     Siga las instrucciones del mdico en lo que respecta al cuidado del lugar de insercin. Este es el lugar donde se coloc un tubo (catter) intravenoso en la vena. Asegrese de hacer lo siguiente: Lvese las manos con agua y jabn durante al menos 20 segundos antes y despus de cambiarse la venda. Use un desinfectante para manos si no dispone de agua y jabn. Cambie las vendas como se lo haya indicado el mdico. Controle el lugar de insercin todos los das para detectar signos de infeccin. Est atento a los siguientes signos: Dolor, hinchazn o enrojecimiento. Sangrado proveniente del lugar. Calor. Pus o mal olor. Instrucciones generales Use los medicamentos de venta libre y los recetados solamente como se lo haya indicado el mdico. Haga reposo como se lo haya indicado el mdico. Retome sus actividades habituales como se lo haya indicado el mdico. Concurra a todas las visitas de seguimiento. Es posible que deba hacerse anlisis en ciertos momentos para controlar la sangre. Comunquese con un mdico si: Tiene picazn o zonas enrojecidas e hinchadas en la piel (urticaria). Tiene fiebre o escalofros. Tiene dolor de cabeza, en la espalda o el pecho. Est preocupado o nervioso (ansioso). Se siente dbil despus de realizar sus actividades habituales. Tiene alguno de los siguientes problemas en el lugar de la insercin: Enrojecimiento, hinchazn, calor o dolor. Sangrado que no se detiene al ejercer presin. Pus o mal olor. Si recibi la transfusin de sangre en un entorno para pacientes  ambulatorios, le indicarn con quin debe ponerse en contacto para informar cualquier reaccin. Solicite ayuda de inmediato si: Tiene signos de una reaccin grave. Puede deberse a una alergia o provenir del sistema de defensa del cuerpo (sistema inmunitario). Algunos signos son los siguientes: Dificultad para respirar o falta de aire. Hinchazn en la cara o sensacin de calor (sofoco). Una erupcin cutnea generalizada. Pis (orina) de color oscuro o sangre en el pis. Latidos cardacos acelerados. Estos sntomas pueden indicar una emergencia. Solicite ayuda de inmediato. Llame al 911. No espere a ver si los sntomas desaparecen. No conduzca por sus propios medios hasta el hospital. Resumen Es normal tener moretones y dolor en el lugar donde se coloc la va intravenosa (IV). Controle el lugar de insercin todos los das para detectar signos de infeccin. Haga reposo como se lo haya indicado el mdico. Retome sus actividades habituales como se lo haya indicado el mdico. Obtenga ayuda de inmediato si tiene signos de una reaccin grave. Esta informacin no tiene como fin reemplazar el consejo del mdico. Asegrese de hacerle al mdico cualquier pregunta que tenga. Document Revised: 05/06/2021 Document Reviewed: 05/06/2021 Elsevier Patient Education  2023 Elsevier Inc.  

## 2022-03-01 LAB — TYPE AND SCREEN
ABO/RH(D): O POS
Antibody Screen: NEGATIVE
Unit division: 0

## 2022-03-01 LAB — BPAM RBC
Blood Product Expiration Date: 202403042359
ISSUE DATE / TIME: 202402051014
Unit Type and Rh: 5100

## 2022-03-04 ENCOUNTER — Ambulatory Visit: Payer: Self-pay

## 2022-03-04 NOTE — Patient Outreach (Signed)
  Care Coordination   03/04/2022 Name: Amanda Duarte MRN: [id number] DOB: 1955-11-13   Care Coordination Outreach Attempts:  An unsuccessful telephone outreach was attempted for a scheduled appointment today.  Follow Up Plan:  Additional outreach attempts will be made to offer the patient care coordination information and services.   Encounter Outcome:  No Answer   Care Coordination Interventions:  No, not indicated    Barb Merino, RN, BSN, CCM Care Management Coordinator [facility] Care Management  Direct Phone: [phone]

## 2022-03-06 NOTE — Assessment & Plan Note (Signed)
-  Continue medication and follow-up with PCP

## 2022-03-06 NOTE — Assessment & Plan Note (Addendum)
-  [id number], G3, ER, PR and HER-2 negative (triple negative), [id number]  -diagnosed in 04/2017, s/p neoadjuvant ddAC-TC, left mastectomy and adjuvant radiation. She had excellent response. -most recent right mammogram on 07/26/21 and right axilla UKoreaon 10/27/21 were benign. -she is on surveillance now

## 2022-03-06 NOTE — Assessment & Plan Note (Signed)
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  Her FISH test showed 3 abnormal type, her risk of transforming to acute leukemia is very high, likely in the next 6 to 12 months -She started azacitidine injection on November 15, 2021, she was admitted to hospital after chemo, for severe profound pancytopenia, aspiration pneumonia, required intubation. She has recovered well  -Her blood counts improved after first cycle chemotherapy, likely she responded to treatment. -she started cycle 2 azacitidine on 01/25/2022, will reduce to 4 days for this cycle. If she tolerates well, will change to 5 days from next cyle  -She was evaluated for bone marrow transplant at [facility] and is open to transplant. We recommend continuing chemo for now, and reserve transplant when she progress. She is tolerating Azacitadine well.

## 2022-03-07 ENCOUNTER — Other Ambulatory Visit: Payer: Self-pay

## 2022-03-07 ENCOUNTER — Inpatient Hospital Stay: Payer: Medicare Other

## 2022-03-07 ENCOUNTER — Inpatient Hospital Stay (HOSPITAL_BASED_OUTPATIENT_CLINIC_OR_DEPARTMENT_OTHER): Payer: Medicare Other | Admitting: Hematology

## 2022-03-07 ENCOUNTER — Encounter: Payer: Self-pay | Admitting: Hematology

## 2022-03-07 VITALS — BP 112/45 | HR 86 | Temp 99.1°F | Resp 18 | Ht 60.0 in | Wt 116.7 lb

## 2022-03-07 DIAGNOSIS — I10 Essential (primary) hypertension: Secondary | ICD-10-CM | POA: Diagnosis not present

## 2022-03-07 DIAGNOSIS — C50212 Malignant neoplasm of upper-inner quadrant of left female breast: Secondary | ICD-10-CM | POA: Diagnosis not present

## 2022-03-07 DIAGNOSIS — D46Z Other myelodysplastic syndromes: Secondary | ICD-10-CM

## 2022-03-07 DIAGNOSIS — E1169 Type 2 diabetes mellitus with other specified complication: Secondary | ICD-10-CM | POA: Diagnosis not present

## 2022-03-07 DIAGNOSIS — E785 Hyperlipidemia, unspecified: Secondary | ICD-10-CM

## 2022-03-07 DIAGNOSIS — D649 Anemia, unspecified: Secondary | ICD-10-CM

## 2022-03-07 DIAGNOSIS — Z5111 Encounter for antineoplastic chemotherapy: Secondary | ICD-10-CM | POA: Diagnosis not present

## 2022-03-07 DIAGNOSIS — Z170 Estrogen receptor positive status [ER+]: Secondary | ICD-10-CM

## 2022-03-07 LAB — CMP (CANCER CENTER ONLY)
ALT: 5 U/L (ref 0–44)
AST: 11 U/L — ABNORMAL LOW (ref 15–41)
Albumin: 3.8 g/dL (ref 3.5–5.0)
Alkaline Phosphatase: 96 U/L (ref 38–126)
Anion gap: 6 (ref 5–15)
BUN: 15 mg/dL (ref 8–23)
CO2: 27 mmol/L (ref 22–32)
Calcium: 9.4 mg/dL (ref 8.9–10.3)
Chloride: 104 mmol/L (ref 98–111)
Creatinine: 0.55 mg/dL (ref 0.44–1.00)
GFR, Estimated: >60 mL/min (ref >60)
Glucose, Bld: 153 mg/dL — ABNORMAL HIGH (ref 70–99)
Potassium: 3.9 mmol/L (ref 3.5–5.1)
Sodium: 137 mmol/L (ref 135–145)
Total Bilirubin: 0.5 mg/dL (ref 0.3–1.2)
Total Protein: 7.8 g/dL (ref 6.5–8.1)

## 2022-03-07 LAB — CBC WITH DIFFERENTIAL (CANCER CENTER ONLY)
Abs Immature Granulocytes: 0.1 10*3/uL — ABNORMAL HIGH (ref 0.00–0.07)
Basophils Absolute: 0 10*3/uL (ref 0.0–0.1)
Basophils Relative: 0 %
Eosinophils Absolute: 1 10*3/uL — ABNORMAL HIGH (ref 0.0–0.5)
Eosinophils Relative: 28 %
HCT: 22.4 % — ABNORMAL LOW (ref 36.0–46.0)
Hemoglobin: 7.7 g/dL — ABNORMAL LOW (ref 12.0–15.0)
Lymphocytes Relative: 54 %
Lymphs Abs: 1.9 10*3/uL (ref 0.7–4.0)
MCH: 29.4 pg (ref 26.0–34.0)
MCHC: 34.4 g/dL (ref 30.0–36.0)
MCV: 85.5 fL (ref 80.0–100.0)
Monocytes Absolute: 0.1 10*3/uL (ref 0.1–1.0)
Monocytes Relative: 2 %
Myelocytes: 2 %
Neutro Abs: 0.5 10*3/uL — ABNORMAL LOW (ref 1.7–7.7)
Neutrophils Relative %: 14 %
Platelet Count: 79 10*3/uL — ABNORMAL LOW (ref 150–400)
RBC: 2.62 MIL/uL — ABNORMAL LOW (ref 3.87–5.11)
RDW: 13.7 % (ref 11.5–15.5)
WBC Count: 3.5 10*3/uL — ABNORMAL LOW (ref 4.0–10.5)
nRBC: 0.9 % — ABNORMAL HIGH (ref 0.0–0.2)

## 2022-03-07 LAB — PREPARE RBC (CROSSMATCH)

## 2022-03-07 LAB — SAMPLE TO BLOOD BANK

## 2022-03-07 MED ORDER — ACETAMINOPHEN 325 MG PO TABS
650.0000 mg | ORAL_TABLET | Freq: Once | ORAL | Status: AC
  Administered 2022-03-07: 650 mg via ORAL
  Filled 2022-03-07: qty 2

## 2022-03-07 MED ORDER — SODIUM CHLORIDE 0.9% IV SOLUTION
250.0000 mL | Freq: Once | INTRAVENOUS | Status: AC
  Administered 2022-03-07: 250 mL via INTRAVENOUS

## 2022-03-07 MED ORDER — DIPHENHYDRAMINE HCL 25 MG PO CAPS
25.0000 mg | ORAL_CAPSULE | Freq: Once | ORAL | Status: AC
  Administered 2022-03-07: 25 mg via ORAL
  Filled 2022-03-07: qty 1

## 2022-03-07 NOTE — Progress Notes (Signed)
[place]   [phone] Fax:([phone]) [phone]   Clinic Follow up Note   Patient Care Team: [name], NP as PCP - General (Nurse Practitioner) [name], MD as Consulting Physician (Hematology) Fanny Skates, MD as Consulting Physician (General Surgery) Alla Feeling, NP as Nurse Practitioner (Nurse Practitioner) Gery Pray, MD as Consulting Physician (Radiation Oncology) Art Buff, MD as Referring Physician (Hematology and Oncology) Lynne Logan, RN as [place] Management Aris Lot, Ramon Dredge, MD as Referring Physician (Hematology and Oncology)  Date of Service:  03/07/2022  CHIEF COMPLAINT: f/u of MDS, h/o breast cancer    CURRENT THERAPY:  Azacitadine injection, days 1-5, started on 11/15/21  (first cycle 7 days)     ASSESSMENT:  Amanda Duarte is a 67 y.o. 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  Her FISH test showed 3 abnormal type, her risk of transforming to acute leukemia is very high, likely in the next 6 to 12 months -She started azacitidine injection on November 15, 2021, she was admitted to hospital after chemo, for severe profound pancytopenia, aspiration pneumonia, required intubation. She has recovered well  -Her blood counts improved after first cycle chemotherapy, likely she responded to treatment. -she started cycle 2 azacitidine on 01/25/2022, will reduce to 4 days for this cycle. If she tolerates well, will change to 5 days from next cyle  -She was evaluated for bone marrow transplant at [facility] and is open to transplant. We recommend continuing chemo for now, and reserve transplant when she progress. She is tolerating Azacitadine well.    Malignant neoplasm of upper-inner quadrant of left breast in  female, estrogen receptor negative (HCC) -cTxN2aM0, G3, ER, PR and HER-2 negative (triple negative), [id number]  -diagnosed in 04/2017, s/p neoadjuvant ddAC-TC, left mastectomy and adjuvant radiation. She had excellent response. -most recent right mammogram on 07/26/21 and right axilla UKoreaon 10/27/21 were benign. -she is on surveillance now   Essential hypertension -Continue medication and follow-up with PCP   Type 2 diabetes mellitus with hyperlipidemia ([place] -Continue medication and follow-up with PCP     PLAN: -lab reviewed hg7.7 -proceed with 1 unit of blood transfusion today -lab,f/u and Blood 03/14/2022 -If ANC is very low prescribe prophylactic antibiotics Levaquin  SUMMARY OF ONCOLOGIC HISTORY: Oncology History Overview Note  Cancer Staging Malignant neoplasm of upper-inner quadrant of left breast in female, estrogen receptor positive ([place] Staging form: Breast, AJCC 8th Edition - Clinical stage from 05/01/2017: Stage Unknown (cTX, cN2, cM0, G3, ER-, PR-, HER2-) - Signed by [name] on 11/01/2017 - Pathologic stage from 11/20/2017: No Stage Recommended (ypT0, pN143m cM0, GX, ER-, PR-, HER2-) - Signed by FeTruitt MerleMD on 12/10/2017     Malignant neoplasm of upper-inner quadrant of left breast in female, estrogen receptor negative ([place] 04/28/2017 Mammogram   IMPRESSION: Two adjacent masses/enlarged lymph nodes in the LOWER LEFT axilla, the largest measuring 2.1 cm. Tissue sampling of 1 of these is recommended to exclude malignancy/lymphoma. No mammographic evidence of breast malignancy bilaterally.    05/01/2017 Initial Biopsy   Diagnosis 05/01/17 Lymph node, needle/core biopsy, low left inferior axillary - METASTATIC POORLY DIFFERENTIATED CARCINOMA TO A LYMPH NODE. SEE NOTE.   05/01/2017 Cancer Staging   Staging form: Breast, AJCC 8th Edition - Clinical stage from 05/01/2017: Stage  Unknown (cTX, cN1, cM0, G3, ER-, PR-, HER2-) - Signed by [name], MD on 06/02/2017     05/01/2017 Receptors her2   Lymph Node Biopsy:  HER2-Negative  PR-Negative  ER- Negative    05/10/2017 Imaging   MR Breast W WO Contrast 05/10/17 IMPRESSION: No MRI evidence of malignancy in the right breast. Area of week stippled non mass enhancement in the left breast upper inner quadrant. Separate area of thin linear non mass enhancement in the subareolar left breast. Three grossly abnormal left axillary lymph nodes, and more than 4 less than 1 cm indeterminate left axillary lymph nodes. No evidence of right axillary lymphadenopathy.   05/17/2017 Initial Biopsy   Diagnosis 05/17/17 1. 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 2. Breast, left, needle core biopsy, upper inner post MR enhancement - MICROSCOPIC FOCUS OF DUCTAL CARCINOMA IN SITU.   05/17/2017 Receptors her2   Left Breast Biopsy:  ER-Negative PR-Negative HER2-Negative   05/22/2017 Initial Diagnosis   Malignant neoplasm of upper-inner quadrant of left breast in female, estrogen receptor positive ([place])   06/01/2017 Imaging   Bo[place] Scan 06/01/17 IMPRESSION: No definite scintigraphic evidence of osseous metastatic disease.   Posttraumatic and postsurgical uptake at the LEFT knee.   Nonspecific soft tissue distribution of tracer at the LEFT thigh, could represent contusion, hemorrhage, soft tissue edema, or soft tissue calcifications such as from heterotopic calcification and myositis ossificans; recommend clinical correlation and consider dedicated LEFT femoral radiographs.   Single focus of nonspecific increased tracer localization at the lateral LEFT orbit.     06/01/2017 Imaging   06/01/2017 Bone Scan IMPRESSION: No definite scintigraphic evidence of osseous metastatic disease.   Posttraumatic and postsurgical uptake at the LEFT knee.   Nonspecific soft tissue distribution of tracer at the LEFT thigh, could represent contusion,  hemorrhage, soft tissue edema, or soft tissue calcifications such as from heterotopic calcification and myositis ossificans; recommend clinical correlation and consider dedicated LEFT femoral radiographs.   Single focus of nonspecific increased tracer localization at the lateral LEFT orbit.   06/09/2017 -  Chemotherapy   ddAC every 2 weeks for 4 weeks starting 06/09/17-07/21/17 followed by weekly [place] and taxol for 12 weeks 08/04/17-10/20/17.    10/24/2017 Imaging   Breast MRI B/l 10/24/17 IMPRESSION: 1. No residual enhancement in the LEFT breast following neoadjuvant treatment. 2. Significantly smaller LEFT axillary lymph nodes, largest now measuring 1.4 centimeters. The remainder of LEFT axillary lymph nodes demonstrate normal fatty hila. RECOMMENDATION: Treatment plan for known LEFT breast cancer.   11/20/2017 Cancer Staging   Staging form: Breast, AJCC 8th Edition - Pathologic stage from 11/20/2017: No Stage Recommended (ypT0, pN46m, cM0, GX, ER-, PR-, HER2-) - Signed by [name] on 12/10/2017   11/20/2017 Surgery   Left mastectomy and SLN biopsy by Dr. IDalbert Batman   11/20/2017 Pathology Results   Breast, modified radical mastectomy , left - MICROSCOPIC FOCI OF RESIDUAL METASTATIC CARCINOMA INVOLVING TWO OF EIGHT LYMPH NODES (2/8); LARGEST CONTINUOUS FOCUS MEASURES 0.1 CM. - NO EVIDENCE OF RESIDUAL CARCINOMA IN THE MASTECTOMY SPECIMEN - MARKED THERAPY-RELATED CHANGES, INCLUDING DENSE HYALIN FIBROSIS     11/20/2017 Receptors her2   ER- PR- HER2- (IHC 1+)   01/02/2018 - 02/14/2018 Radiation Therapy   Adjuvant Radiation 01/02/18 - 02/14/18   07/31/2018 Imaging   Baseline DEXA 07/31/18  ASSESSMENT: The BMD measured at AP Spine L1-L4 is 0.973 g/cm2 with a T-score of -1.7.  This patient is considered OSTEOPENIC according to [facility]) criteria. The scan quality is good.   Site Region Measured Date Measured Age YA T-score BMD Significant CHANGE   AP  Spine  L1-L4      07/31/2018    63.1         -1.7    0.973 g/cm2   DualFemur Neck Left  07/31/2018    63.1         -1.5    0.828 g/cm2   DualFemur Total Mean 07/31/2018    63.1         0.1     1.016 g/cm2   11/05/2018 Survivorship   Per [name], NP    11/15/2021 -  Chemotherapy   Patient is on Treatment Plan : MYELODYSPLASIA  Azacitidine SQ D1-7 q28d     02/03/2022 Imaging    IMPRESSION: No active cardiopulmonary disease. No evidence of pneumonia or pulmonary edema. Possible mild chronic interstitial lung disease.      INTERVAL HISTORY:  Amanda Duarte is here for a follow up of  MDS, h/o breast cancer  She was last seen by me on 02/21/2022 She presents to the clinic accompanied by interpreter.Pt states she feels better after the blood transfusion. She denies having any pain. Pt reports of having a good appetite. She also stated that she went to the beach.    All other systems were reviewed with the patient and are negative.  MEDICAL HISTORY:  Past Medical History:  Diagnosis Date   Cancer ([place]) 03/2017   left breast cancer   Diabetes mellitus without complication ([place])    Hyperlipidemia    Hypertension    Malignant neoplasm of left breast ([place])    Stroke (cerebrum) ([place])    Tibial plateau fracture, left    04-13-17 had ORIF    SURGICAL HISTORY: Past Surgical History:  Procedure Laterality Date   MASTECTOMY Left 2019   MASTECTOMY MODIFIED RADICAL Left 11/20/2017   Procedure: LEFT MODIFIED RADICAL MASTECTOMY;  Surgeon: Fanny Skates, MD;  Location: [place];  Service: General;  Laterality: Left;   ORIF TIBIA PLATEAU Left 04/13/2017   Procedure: OPEN REDUCTION INTERNAL FIXATION (ORIF) TIBIAL PLATEAU;  Surgeon: Shona Needles, MD;  Location: [facility];  Service: Orthopedics;  Laterality: Left;   PORT-A-CATH REMOVAL Right 11/20/2017   Procedure: REMOVAL PORT-A-CATH;  Surgeon: Fanny Skates, MD;  Location: [place];  Service: General;  Laterality: Right;   PORTACATH PLACEMENT  Right 05/31/2017   Procedure: INSERTION PORT-A-CATH;  Surgeon: Fanny Skates, MD;  Location: [place];  Service: General;  Laterality: Right;    I have reviewed the social history and family history with the patient and they are unchanged from previous note.  ALLERGIES:  is allergic to victoza [liraglutide].  MEDICATIONS:  Current Outpatient Medications  Medication Sig Dispense Refill   Accu-Chek Softclix Lancets lancets Use to check blood sugar 3 times daily. E11.65 100 each 3   amLODipine (NORVASC) 5 MG tablet Take 1 tablet (5 mg total) by mouth daily. For blood pressure 90 tablet 1   atorvastatin (LIPITOR) 40 MG tablet Take 1 tablet (40 mg total) by mouth daily. For cholesterol 90 tablet 2   Blood Glucose Monitoring Suppl (ACCU-CHEK GUIDE) w/Device KIT use kit to check blood glucose three times daily 1 kit 0   Blood Pressure Monitor DEVI Please provide patient with insurance approved blood pressure monitor 1 each 0   citalopram (CELEXA) 20 MG tablet Take 1  tablet (20 mg total) by mouth daily. For depression (Patient not taking: Reported on 01/11/2022) 90 tablet 3   Continuous Blood Gluc Receiver (FREESTYLE LIBRE READER) DEVI Monitor blood glucose levels 5-6 times per day E11.65 (Patient not taking: Reported on 01/11/2022) 1 each 0   Continuous Blood Gluc Sensor (FREESTYLE LIBRE 2 SENSOR) MISC Monitor blood glucose levels 5-6 times per day E11.65 (Patient not taking: Reported on 01/11/2022) 3 each 6   fenofibrate (TRICOR) 145 MG tablet Take 1 tablet (145 mg total) by mouth daily. For cholesterol 90 tablet 2   fluticasone (FLONASE) 50 MCG/ACT nasal spray Place 2 sprays into both nostrils daily. 16 g 6   glucose blood (ACCU-CHEK GUIDE) test strip Use to check blood sugar 3 times daily. E11.65 100 each 11   hydrOXYzine (ATARAX) 10 MG tablet Take 1 tablet (10 mg total) by mouth 3 (three) times daily as needed for anxiety. 60 tablet 3   insulin glargine (LANTUS) 100 UNIT/ML  Solostar Pen Inject 20 Units into the skin 2 (two) times daily. 36 mL 1   insulin lispro (HUMALOG KWIKPEN) 100 UNIT/ML KwikPen Inject 10 Units into the skin 3 (three) times daily. 15 mL 1   Insulin Pen Needle 31G X 5 MM MISC Inject 1 Device into the skin QID. For use with insulin pens 200 each 6   Insulin Syringe-Needle U-100 30G X 5/16" 1 ML MISC Use as directed to inject into the skin 3 times daily. 200 each 6   levofloxacin (LEVAQUIN) 500 MG tablet Take 1 tablet (500 mg total) by mouth daily. 20 tablet 0   losartan (COZAAR) 50 MG tablet Take 1 tablet (50 mg total) by mouth daily. For blood pressure 90 tablet 1   ondansetron (ZOFRAN) 8 MG tablet Take 1 tablet (8 mg total) by mouth every 8 (eight) hours as needed for nausea or vomiting. (Patient taking differently: Take 8 mg by mouth as needed for nausea or vomiting.) 30 tablet 1   pantoprazole (PROTONIX) 40 MG tablet Take 1 tablet (40 mg total) by mouth daily at 6 (six) AM. For heartburn 90 tablet 1   potassium chloride SA (KLOR-CON M) 20 MEQ tablet Take 1 tablet (20 mEq total) by mouth daily. 30 tablet 3   prochlorperazine (COMPAZINE) 10 MG tablet Take 1 tablet (10 mg total) by mouth every 6 (six) hours as needed for nausea or vomiting. 30 tablet 1   No current facility-administered medications for this visit.   Facility-Administered Medications Ordered in Other Visits  Medication Dose Route Frequency Provider Last Rate Last Admin   heparin lock flush 100 unit/mL  500 Units Intracatheter Once PRN [name], MD       sodium chloride flush (NS) 0.9 % injection 10 mL  10 mL Intracatheter PRN [name], MD        PHYSICAL EXAMINATION: ECOG PERFORMANCE STATUS: 1 - Symptomatic but completely ambulatory  Vitals:   03/07/22 0808  BP: (!) 112/45  Pulse: 86  Resp: 18  Temp: 99.1 F (37.3 C)  SpO2: 100%   Wt Readings from Last 3 Encounters:  03/07/22 116 lb 11.2 oz (52.9 kg)  02/21/22 116 lb 3.2 oz (52.7 kg)  02/10/22 115 lb 3.2 oz (52.3  kg)     GENERAL:alert, no distress and comfortable SKIN: skin color normal, no rashes or significant lesions EYES: normal, Conjunctiva are pink and non-injected, sclera clear  NEURO: alert & oriented x 3 with fluent speech    LABORATORY DATA:  I have  reviewed the data as listed    Latest Ref Rng & Units 03/07/2022    7:46 AM 02/28/2022    8:08 AM 02/21/2022    9:14 AM  CBC  WBC 4.0 - 10.5 K/uL PENDING  3.2  2.2   Hemoglobin 12.0 - 15.0 g/dL 7.7  7.9  8.0   Hematocrit 36.0 - 46.0 % 22.4  22.6  22.7   Platelets 150 - 400 K/uL 79  83  48         Latest Ref Rng & Units 03/07/2022    7:46 AM 02/28/2022    8:08 AM 02/21/2022    9:14 AM  CMP  Glucose 70 - 99 mg/dL 153  173  261   BUN 8 - 23 mg/dL 15  17  23   $ Creatinine 0.44 - 1.00 mg/dL 0.55  0.64  0.66   Sodium 135 - 145 mmol/L 137  137  136   Potassium 3.5 - 5.1 mmol/L 3.9  4.0  3.8   Chloride 98 - 111 mmol/L 104  104  105   CO2 22 - 32 mmol/L 27  27  25   $ Calcium 8.9 - 10.3 mg/dL 9.4  9.3  9.0   Total Protein 6.5 - 8.1 g/dL 7.8  7.0    Total Bilirubin 0.3 - 1.2 mg/dL 0.5  0.5    Alkaline Phos 38 - 126 U/L 96  113    AST 15 - 41 U/L 11  9    ALT 0 - 44 U/L 5  5        RADIOGRAPHIC STUDIES: I have personally reviewed the radiological images as listed and agreed with the findings in the report. No results found.    No orders of the defined types were placed in this encounter.  All questions were answered. The patient knows to call the clinic with any problems, questions or concerns. No barriers to learning was detected. The total time spent in the appointment was 30 minutes.     [name], MD 03/07/2022   Felicity Coyer, CMA, am acting as scribe for [name], MD.   I have reviewed the above documentation for accuracy and completeness, and I agree with the above.

## 2022-03-07 NOTE — Patient Instructions (Signed)
Transfusin de [employer], cuidados posteriores Blood Transfusion, Adult, Care After La siguiente informacin ofrece orientacin sobre cmo cuidarse despus del procedimiento. El mdico tambin podr darle instrucciones ms especficas. Comunquese con el mdico si tiene problemas o preguntas. Qu puedo esperar despus del procedimiento? Despus del procedimiento, es normal tener los siguientes sntomas: Dolor y Tour manager donde se coloc la va intravenosa (IV). Dolor de [place]. Siga estas instrucciones en su casa: Cuidado del lugar de la insercin de la va intravenosa (IV)     Siga las instrucciones del mdico acerca del cuidado del lugar de la insercin de la va intravenosa (IV). Asegrese de hacer lo siguiente: Lvese las manos con agua y jabn durante al menos 20 segundos antes y despus de cambiarse la venda (vendaje). Use desinfectante para manos si no dispone de [place] y [place]. Cambie los vendajes como se lo haya indicado el mdico. Psychiatric nurse de insercin de la va intravenosa (IV) todos los das para descartar signos de infeccin. Est atento a los siguientes signos: Dolor, hinchazn o enrojecimiento. Sangrado proveniente del Environmental consultant. Calor. Pus o mal olor. Instrucciones generales Use los medicamentos de venta libre y los recetados solamente como se lo haya indicado el mdico. Haga reposo como se lo haya indicado el mdico. Retome sus actividades normales como se lo haya indicado el mdico. Concurra a todas las visitas de seguimiento. Es posible que sea necesario realizar anlisis de laboratorio en ciertos perodos para volver a Chief Technology Officer sus recuentos sanguneos. Comunquese con un mdico si: Tiene picazn o zonas enrojecidas e hinchadas en la piel (urticaria). Tiene fiebre o escalofros. Tiene dolor de cabeza, en la espalda o el pecho. Se siente ansioso o dbil despus de realizar sus actividades habituales. Tiene enrojecimiento, hinchazn, calor  o dolor alrededor del lugar de la insercin de la va intravenosa. Observa sangre que sale del lugar de la insercin de la va intravenosa que no se detiene al Oceanographer presin. Tiene pus o percibe mal olor que proviene del lugar de la insercin de la va intravenosa. Si recibi la transfusin de sangre en un entorno para pacientes ambulatorios, le indicarn con quin debe ponerse en contacto para informar cualquier reaccin. Solicite ayuda de inmediato si: Tiene signos de Nurse, mental health grave o de una reaccin del sistema inmunitario, por ejemplo: Dificultad para respirar o falta de aire. Hinchazn de la cara, sensacin de sofoco o erupcin cutnea generalizada. Orina de color oscuro o sangre en la orina. Latidos cardacos acelerados. Estos sntomas pueden Sales executive. Solicite ayuda de inmediato. Llame al 911. No espere a ver si los sntomas desaparecen. No conduzca por sus propios medios [employer] hospital. Resumen Los moretones y Conservation officer, historic buildings alrededor del lugar de la insercin de la va intravenosa (IV) son frecuentes. Controle TEFL teacher de insercin de la va intravenosa (IV) todos los das para descartar signos de infeccin. Haga reposo como se lo haya indicado el mdico. Retome sus actividades normales como se lo haya indicado el mdico. Busque ayuda de inmediato si tiene sntomas de una reaccin alrgica grave o de una reaccin del sistema inmunitario a una transfusin de [place]. Esta informacin no tiene Marine scientist el consejo del mdico. Asegrese de hacerle al mdico cualquier pregunta que tenga. Document Revised: 05/06/2021 Document Reviewed: 05/06/2021 Elsevier Patient Education  [place].

## 2022-03-08 LAB — BPAM RBC
Blood Product Expiration Date: 202403102359
ISSUE DATE / TIME: 202402120949
Unit Type and Rh: 5100

## 2022-03-08 LAB — TYPE AND SCREEN
ABO/RH(D): O POS
Antibody Screen: NEGATIVE
Unit division: 0

## 2022-03-09 ENCOUNTER — Encounter: Payer: Self-pay | Admitting: Hematology

## 2022-03-09 NOTE — Progress Notes (Signed)
Received message from social worker of patient inquiring about grant/bills.  Had Amanda Duarte call and interpret to patient what is needed to reapply for grant at next visit on 03/14/22 and be scanned to me. She will then be given grant paperwork to complete and the expense sheet of what the grant can be used for. She was advised to bring the bill she wishes to get paid at that time.  She will be given my card for any additional financial questions or concerns.

## 2022-03-14 ENCOUNTER — Other Ambulatory Visit: Payer: Self-pay

## 2022-03-14 ENCOUNTER — Inpatient Hospital Stay: Payer: Medicare Other

## 2022-03-14 ENCOUNTER — Encounter: Payer: Self-pay | Admitting: Hematology

## 2022-03-14 DIAGNOSIS — Z5111 Encounter for antineoplastic chemotherapy: Secondary | ICD-10-CM | POA: Diagnosis not present

## 2022-03-14 DIAGNOSIS — C50212 Malignant neoplasm of upper-inner quadrant of left female breast: Secondary | ICD-10-CM

## 2022-03-14 DIAGNOSIS — D46Z Other myelodysplastic syndromes: Secondary | ICD-10-CM

## 2022-03-14 DIAGNOSIS — D649 Anemia, unspecified: Secondary | ICD-10-CM

## 2022-03-14 LAB — CMP (CANCER CENTER ONLY)
ALT: 6 U/L (ref 0–44)
AST: 12 U/L — ABNORMAL LOW (ref 15–41)
Albumin: 3.7 g/dL (ref 3.5–5.0)
Alkaline Phosphatase: 90 U/L (ref 38–126)
Anion gap: 6 (ref 5–15)
BUN: 14 mg/dL (ref 8–23)
CO2: 28 mmol/L (ref 22–32)
Calcium: 9.2 mg/dL (ref 8.9–10.3)
Chloride: 105 mmol/L (ref 98–111)
Creatinine: 0.53 mg/dL (ref 0.44–1.00)
GFR, Estimated: >60 mL/min (ref >60)
Glucose, Bld: 147 mg/dL — ABNORMAL HIGH (ref 70–99)
Potassium: 3.9 mmol/L (ref 3.5–5.1)
Sodium: 139 mmol/L (ref 135–145)
Total Bilirubin: 0.6 mg/dL (ref 0.3–1.2)
Total Protein: 7.6 g/dL (ref 6.5–8.1)

## 2022-03-14 LAB — CBC WITH DIFFERENTIAL (CANCER CENTER ONLY)
Abs Immature Granulocytes: 0.1 10*3/uL — ABNORMAL HIGH (ref 0.00–0.07)
Band Neutrophils: 1 %
Basophils Absolute: 0 10*3/uL (ref 0.0–0.1)
Basophils Relative: 1 %
Blasts: 1 %
Eosinophils Absolute: 0.5 10*3/uL (ref 0.0–0.5)
Eosinophils Relative: 18 %
HCT: 26.1 % — ABNORMAL LOW (ref 36.0–46.0)
Hemoglobin: 9.1 g/dL — ABNORMAL LOW (ref 12.0–15.0)
Lymphocytes Relative: 61 %
Lymphs Abs: 1.6 10*3/uL (ref 0.7–4.0)
MCH: 29.7 pg (ref 26.0–34.0)
MCHC: 34.9 g/dL (ref 30.0–36.0)
MCV: 85.3 fL (ref 80.0–100.0)
Metamyelocytes Relative: 1 %
Monocytes Absolute: 0 10*3/uL — ABNORMAL LOW (ref 0.1–1.0)
Monocytes Relative: 0 %
Myelocytes: 1 %
Neutro Abs: 0.5 10*3/uL — ABNORMAL LOW (ref 1.7–7.7)
Neutrophils Relative %: 16 %
Platelet Count: 64 10*3/uL — ABNORMAL LOW (ref 150–400)
RBC: 3.06 MIL/uL — ABNORMAL LOW (ref 3.87–5.11)
RDW: 13.2 % (ref 11.5–15.5)
WBC Count: 2.7 10*3/uL — ABNORMAL LOW (ref 4.0–10.5)
nRBC: 0 % (ref 0.0–0.2)

## 2022-03-14 LAB — SAMPLE TO BLOOD BANK

## 2022-03-14 NOTE — Progress Notes (Signed)
Patient here for blood transfusion- her hemoglobin is 9.1 today. No blood needed per [name].

## 2022-03-14 NOTE — Progress Notes (Signed)
Patient aware that she is not receiving treatment today.

## 2022-03-14 NOTE — Progress Notes (Signed)
Per [name], pt does not need plts for PRBCs today based on labs.  Pt OK to d/c home.

## 2022-03-14 NOTE — Progress Notes (Signed)
Patient provided income documents for 2nd [employer].  Patient approved for additional funds. She has a copy of the approval letter and expense sheet along with the Outpatient pharmacy information. She provided an expense to be paid.  She has my card for any additional financial questions or concerns via interpreter.

## 2022-03-16 ENCOUNTER — Other Ambulatory Visit (HOSPITAL_COMMUNITY): Payer: Self-pay

## 2022-03-16 ENCOUNTER — Inpatient Hospital Stay: Payer: Medicare Other | Admitting: Licensed Clinical Social Worker

## 2022-03-16 DIAGNOSIS — D46Z Other myelodysplastic syndromes: Secondary | ICD-10-CM

## 2022-03-16 NOTE — Progress Notes (Signed)
[place] CSW Progress Note  Clinical Education officer, museum  contacted pt by phone w/ an interpreter after receiving a voicemail regarding pt's light bill.  Per chart review pt has been approved for a second [employer] and was asked to bring in a copy of any bills the grant should be applied to.  Via interpreter CSW provided pt w/ the contact information for the patient financial resource specialist as they are the person who would have received a bill that was dropped off and who are able to confirm if payment was submitted on behalf of pt.  CSW also send a message to the patient financial resource specialist requesting a call to pt.        Henriette Combs, LCSW    Patient is participating in a Managed Medicaid Plan:  Yes

## 2022-03-17 ENCOUNTER — Telehealth: Payer: Self-pay | Admitting: *Deleted

## 2022-03-17 ENCOUNTER — Encounter: Payer: Self-pay | Admitting: *Deleted

## 2022-03-17 DIAGNOSIS — C946 Myelodysplastic disease, not classified: Secondary | ICD-10-CM

## 2022-03-17 NOTE — Patient Outreach (Signed)
  Care Coordination   Initial Visit Note   03/17/2022 Name: [name] MRN: [id number] DOB: 09-24-1955  Amanda Duarte is a 67 y.o. year old female who sees [name], NP for primary care. I spoke with  [name] by phone today.  What matters to the patients health and wellness today?  PATIENT AGREES TO PARTICIPATE IN THE HEALTH EQUITY PLAN. She speaks Spanish so she needs an interpreter for most situations. NP was able to communicate adequately in Spanish with her today, per pt report.    Goals Addressed             This Visit's Progress    Patient will report she is getting adequate food to support herself and her grandson in the next month.       Interventions Today    Flowsheet Row Most Recent Value  General Interventions   General Interventions Discussed/Reviewed General Interventions Discussed  [SDOH reveals need for food supplementation, emotional support, potential need for utility assistance.]      Referral to CMA for food, and possible assistance with utilities.     Patient will verbalize she is getting the emotional support she needs over the next 3 months.       Interventions Today    Flowsheet Row Most Recent Value  General Interventions   General Interventions Discussed/Reviewed General Interventions Discussed  [SDOH reveals need for food supplementation, emotional support, potential need for utility assistance.]  Mental Health Interventions   Mental Health Discussed/Reviewed Mental Health Discussed, Anxiety, Depression, Grief and Loss  [Pt PHQ was normal, however, she has a terminal illness with poor prognosis. She has a strong faith and a positive attitude. Encouraged her to verbalize feelings of depression, helplessness, loss of hope with providers for appropriate intervention.]              SDOH assessments and interventions completed:  Yes  SDOH Interventions Today    Flowsheet Row Most Recent Value  SDOH Interventions   Food Insecurity  Interventions Other (Comment)  [Refer to care guide]  Housing Interventions Intervention Not Indicated  Utilities Interventions Other (Comment)  [Sending need to CMA.]  Alcohol Usage Interventions Intervention Not Indicated (Score <7)  Financial Strain Interventions Intervention Not Indicated  Physical Activity Interventions Intervention Not Indicated  Stress Interventions Intervention Not Indicated  Social Connections Interventions Intervention Not Indicated        Care Coordination Interventions:  Yes, provided   Follow up plan: Follow up call scheduled for 2 weeks.    Encounter Outcome:  Pt. Visit Completed   [name] C. Myrtie Neither, MSN, [facility] Gerontological Nurse Practitioner [facility] Care Management [phone]

## 2022-03-18 ENCOUNTER — Telehealth: Payer: Self-pay

## 2022-03-18 NOTE — Progress Notes (Unsigned)
[place]   [phone] [phone]   Clinic Follow up Note   Patient Care Team: [name], NP as PCP - General (Nurse Practitioner) [name], MD as Consulting Physician (Hematology) Fanny Skates, MD as Consulting Physician (General Surgery) Alla Feeling, NP as Nurse Practitioner (Nurse Practitioner) Gery Pray, MD as Consulting Physician (Radiation Oncology) Art Buff, MD as Referring Physician (Hematology and Oncology) Lynne Logan, RN as [place] Management Aris Lot, Ramon Dredge, MD as Referring Physician (Hematology and Oncology)  Date of Service:  03/21/2022  CHIEF COMPLAINT: f/u of MDS, h/o breast cancer    CURRENT THERAPY:  Azacitadine injection, days 1-5, started on 11/15/21  (first cycle 7 days)     ASSESSMENT:  Amanda Duarte is a 67 y.o. 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  Her FISH test showed 3 abnormal type, her risk of transforming to acute leukemia is very high, likely in the next 6 to 12 months -She started azacitidine injection on November 15, 2021, she was admitted to hospital after chemo, for severe profound pancytopenia, aspiration pneumonia, required intubation. She has recovered well  -Her blood counts improved after first cycle chemotherapy, likely she responded to treatment. -she started cycle 2 azacitidine on 01/25/2022, will reduce to 4 days for this cycle. If she tolerates well, will change to 5 days from next cyle  -She was evaluated for bone marrow transplant at [facility] and is open to transplant. We recommend continuing chemo for now, and reserve transplant when she progress. She is tolerating Azacitadine well.    Type 2 diabetes mellitus with hyperlipidemia  (HCC) -Continue medication and follow-up with PCP     Malignant neoplasm of upper-inner quadrant of left breast in female, estrogen receptor negative ([place]) -cTxN2aM0, G3, ER, PR and HER-2 negative (triple negative), [id number]  -diagnosed in 04/2017, s/p neoadjuvant ddAC-TC, left mastectomy and adjuvant radiation. She had excellent response. -most recent right mammogram on 07/26/21 and right axilla UKoreaon 10/27/21 were benign. -she is on surveillance now      PLAN: -recommend the pt to come weekly for labs to determined if blood transfusion is needed. -lab reviewed - no tranfusion today  -will proceed with Vidaza today and daily for 4 more days this week   SUMMARY OF ONCOLOGIC HISTORY: Oncology History Overview Note  Cancer Staging Malignant neoplasm of upper-inner quadrant of left breast in female, estrogen receptor positive ([place] Staging form: Breast, AJCC 8th Edition - Clinical stage from 05/01/2017: Stage Unknown (cTX, cN2, cM0, G3, ER-, PR-, HER2-) - Signed by [name] on 11/01/2017 - Pathologic stage from 11/20/2017: No Stage Recommended (ypT0, pN161m cM0, GX, ER-, PR-, HER2-) - Signed by FeTruitt MerleMD on 12/10/2017     Malignant neoplasm of upper-inner quadrant of left breast in female, estrogen receptor negative ([place] 04/28/2017 Mammogram   IMPRESSION: Two adjacent masses/enlarged lymph nodes in the LOWER LEFT axilla, the largest measuring 2.1 cm. Tissue sampling of 1 of these is recommended to exclude malignancy/lymphoma. No mammographic evidence of breast malignancy bilaterally.    05/01/2017 Initial Biopsy   Diagnosis 05/01/17 Lymph node, needle/core biopsy, low left inferior axillary - METASTATIC POORLY DIFFERENTIATED CARCINOMA TO A LYMPH NODE. SEE NOTE.   05/01/2017 Cancer Staging   Staging form: Breast, AJCC  8th Edition - Clinical stage from 05/01/2017: Stage Unknown (cTX, cN1, cM0, G3, ER-, PR-, HER2-) - Signed by [name], MD on 06/02/2017    05/01/2017 Receptors her2    Lymph Node Biopsy:  HER2-Negative  PR-Negative  ER- Negative    05/10/2017 Imaging   MR Breast W WO Contrast 05/10/17 IMPRESSION: No MRI evidence of malignancy in the right breast. Area of week stippled non mass enhancement in the left breast upper inner quadrant. Separate area of thin linear non mass enhancement in the subareolar left breast. Three grossly abnormal left axillary lymph nodes, and more than 4 less than 1 cm indeterminate left axillary lymph nodes. No evidence of right axillary lymphadenopathy.   05/17/2017 Initial Biopsy   Diagnosis 05/17/17 1. 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 2. Breast, left, needle core biopsy, upper inner post MR enhancement - MICROSCOPIC FOCUS OF DUCTAL CARCINOMA IN SITU.   05/17/2017 Receptors her2   Left Breast Biopsy:  ER-Negative PR-Negative HER2-Negative   05/22/2017 Initial Diagnosis   Malignant neoplasm of upper-inner quadrant of left breast in female, estrogen receptor positive ([place])   06/01/2017 Imaging   Bo[place] Scan 06/01/17 IMPRESSION: No definite scintigraphic evidence of osseous metastatic disease.   Posttraumatic and postsurgical uptake at the LEFT knee.   Nonspecific soft tissue distribution of tracer at the LEFT thigh, could represent contusion, hemorrhage, soft tissue edema, or soft tissue calcifications such as from heterotopic calcification and myositis ossificans; recommend clinical correlation and consider dedicated LEFT femoral radiographs.   Single focus of nonspecific increased tracer localization at the lateral LEFT orbit.     06/01/2017 Imaging   06/01/2017 Bone Scan IMPRESSION: No definite scintigraphic evidence of osseous metastatic disease.   Posttraumatic and postsurgical uptake at the LEFT knee.   Nonspecific soft tissue distribution of tracer at the LEFT thigh, could represent contusion, hemorrhage, soft tissue  edema, or soft tissue calcifications such as from heterotopic calcification and myositis ossificans; recommend clinical correlation and consider dedicated LEFT femoral radiographs.   Single focus of nonspecific increased tracer localization at the lateral LEFT orbit.   06/09/2017 -  Chemotherapy   ddAC every 2 weeks for 4 weeks starting 06/09/17-07/21/17 followed by weekly [place] and taxol for 12 weeks 08/04/17-10/20/17.    10/24/2017 Imaging   Breast MRI B/l 10/24/17 IMPRESSION: 1. No residual enhancement in the LEFT breast following neoadjuvant treatment. 2. Significantly smaller LEFT axillary lymph nodes, largest now measuring 1.4 centimeters. The remainder of LEFT axillary lymph nodes demonstrate normal fatty hila. RECOMMENDATION: Treatment plan for known LEFT breast cancer.   11/20/2017 Cancer Staging   Staging form: Breast, AJCC 8th Edition - Pathologic stage from 11/20/2017: No Stage Recommended (ypT0, pN2m, cM0, GX, ER-, PR-, HER2-) - Signed by [name] on 12/10/2017   11/20/2017 Surgery   Left mastectomy and SLN biopsy by Dr. IDalbert Batman   11/20/2017 Pathology Results   Breast, modified radical mastectomy , left - MICROSCOPIC FOCI OF RESIDUAL METASTATIC CARCINOMA INVOLVING TWO OF EIGHT LYMPH NODES (2/8); LARGEST CONTINUOUS FOCUS MEASURES 0.1 CM. - NO EVIDENCE OF RESIDUAL CARCINOMA IN THE MASTECTOMY SPECIMEN - MARKED THERAPY-RELATED CHANGES, INCLUDING DENSE HYALIN FIBROSIS     11/20/2017 Receptors her2   ER- PR- HER2- (IHC 1+)   01/02/2018 - 02/14/2018 Radiation Therapy   Adjuvant Radiation 01/02/18 - 02/14/18   07/31/2018 Imaging   Baseline DEXA 07/31/18  ASSESSMENT: The BMD measured at AP Spine L1-L4 is  0.973 g/cm2 with a T-score of -1.7.   This patient is considered OSTEOPENIC according to [facility]) criteria. The scan quality is good.   Site Region Measured Date Measured Age YA T-score BMD Significant CHANGE   AP Spine  L1-L4       07/31/2018    63.1         -1.7    0.973 g/cm2   DualFemur Neck Left  07/31/2018    63.1         -1.5    0.828 g/cm2   DualFemur Total Mean 07/31/2018    63.1         0.1     1.016 g/cm2   11/05/2018 Survivorship   Per [name], NP    11/15/2021 -  Chemotherapy   Patient is on Treatment Plan : MYELODYSPLASIA  Azacitidine SQ D1-7 q28d     02/03/2022 Imaging    IMPRESSION: No active cardiopulmonary disease. No evidence of pneumonia or pulmonary edema. Possible mild chronic interstitial lung disease.      INTERVAL HISTORY:  Amanda Duarte is here for a follow up of  MDS, h/o breast cancer  She was last seen by me on 03/07/2022. She presents to the clinic alone. Pt state she was told to come tomorrow and not today.    All other systems were reviewed with the patient and are negative.  MEDICAL HISTORY:  Past Medical History:  Diagnosis Date   Cancer ([place]) 03/2017   left breast cancer   Diabetes mellitus without complication ([place])    Hyperlipidemia    Hypertension    Malignant neoplasm of left breast ([place])    Stroke (cerebrum) ([place])    Tibial plateau fracture, left    04-13-17 had ORIF    SURGICAL HISTORY: Past Surgical History:  Procedure Laterality Date   MASTECTOMY Left 2019   MASTECTOMY MODIFIED RADICAL Left 11/20/2017   Procedure: LEFT MODIFIED RADICAL MASTECTOMY;  Surgeon: Fanny Skates, MD;  Location: [place];  Service: General;  Laterality: Left;   ORIF TIBIA PLATEAU Left 04/13/2017   Procedure: OPEN REDUCTION INTERNAL FIXATION (ORIF) TIBIAL PLATEAU;  Surgeon: Shona Needles, MD;  Location: [place];  Service: Orthopedics;  Laterality: Left;   PORT-A-CATH REMOVAL Right 11/20/2017   Procedure: REMOVAL PORT-A-CATH;  Surgeon: Fanny Skates, MD;  Location: [place];  Service: General;  Laterality: Right;   PORTACATH PLACEMENT Right 05/31/2017   Procedure: INSERTION PORT-A-CATH;  Surgeon: Fanny Skates, MD;  Location: [place];  Service: General;   Laterality: Right;    I have reviewed the social history and family history with the patient and they are unchanged from previous note.  ALLERGIES:  is allergic to victoza [liraglutide].  MEDICATIONS:  Current Outpatient Medications  Medication Sig Dispense Refill   Accu-Chek Softclix Lancets lancets Use to check blood sugar 3 times daily. E11.65 100 each 3   amLODipine (NORVASC) 5 MG tablet Take 1 tablet (5 mg total) by mouth daily. For blood pressure 90 tablet 1   atorvastatin (LIPITOR) 40 MG tablet Take 1 tablet (40 mg total) by mouth daily. For cholesterol 90 tablet 2   Blood Glucose Monitoring Suppl (ACCU-CHEK GUIDE) w/Device KIT use kit to check blood glucose three times daily 1 kit 0   Blood Pressure Monitor DEVI Please provide patient with insurance approved blood pressure monitor 1 each 0   citalopram (CELEXA) 20 MG tablet Take 1 tablet (20 mg total) by mouth daily. For depression 90 tablet  3   Continuous Blood Gluc Receiver (FREESTYLE LIBRE READER) DEVI Monitor blood glucose levels 5-6 times per day E11.65 (Patient not taking: Reported on 01/11/2022) 1 each 0   Continuous Blood Gluc Sensor (FREESTYLE LIBRE 2 SENSOR) MISC Monitor blood glucose levels 5-6 times per day E11.65 (Patient not taking: Reported on 01/11/2022) 3 each 6   fenofibrate (TRICOR) 145 MG tablet Take 1 tablet (145 mg total) by mouth daily. For cholesterol 90 tablet 2   fluticasone (FLONASE) 50 MCG/ACT nasal spray Place 2 sprays into both nostrils daily. 16 g 6   glucose blood (ACCU-CHEK GUIDE) test strip Use to check blood sugar 3 times daily. E11.65 100 each 11   hydrOXYzine (ATARAX) 10 MG tablet Take 1 tablet (10 mg total) by mouth 3 (three) times daily as needed for anxiety. 60 tablet 3   insulin glargine (LANTUS) 100 UNIT/ML Solostar Pen Inject 20 Units into the skin 2 (two) times daily. 36 mL 1   insulin lispro (HUMALOG KWIKPEN) 100 UNIT/ML KwikPen Inject 10 Units into the skin 3 (three) times daily. 15 mL 1    Insulin Pen Needle 31G X 5 MM MISC Inject 1 Device into the skin QID. For use with insulin pens 200 each 6   Insulin Syringe-Needle U-100 30G X 5/16" 1 ML MISC Use as directed to inject into the skin 3 times daily. 200 each 6   levofloxacin (LEVAQUIN) 500 MG tablet Take 1 tablet (500 mg total) by mouth daily. 20 tablet 0   losartan (COZAAR) 50 MG tablet Take 1 tablet (50 mg total) by mouth daily. For blood pressure 90 tablet 1   ondansetron (ZOFRAN) 8 MG tablet Take 1 tablet (8 mg total) by mouth every 8 (eight) hours as needed for nausea or vomiting. (Patient not taking: Reported on 03/17/2022) 30 tablet 1   pantoprazole (PROTONIX) 40 MG tablet Take 1 tablet (40 mg total) by mouth daily at 6 (six) AM. For heartburn (Patient not taking: Reported on 03/17/2022) 90 tablet 1   potassium chloride SA (KLOR-CON M) 20 MEQ tablet Take 1 tablet (20 mEq total) by mouth daily. 30 tablet 3   prochlorperazine (COMPAZINE) 10 MG tablet Take 1 tablet (10 mg total) by mouth every 6 (six) hours as needed for nausea or vomiting. (Patient not taking: Reported on 03/17/2022) 30 tablet 1   No current facility-administered medications for this visit.   Facility-Administered Medications Ordered in Other Visits  Medication Dose Route Frequency Provider Last Rate Last Admin   azaCITIDine (VIDAZA) chemo injection 112.5 mg  112.5 mg Subcutaneous Once [name], MD       heparin lock flush 100 unit/mL  500 Units Intracatheter Once PRN [name], MD       ondansetron [facility]) tablet 8 mg  8 mg Oral Once [name], MD       sodium chloride flush (NS) 0.9 % injection 10 mL  10 mL Intracatheter PRN [name], MD        PHYSICAL EXAMINATION: ECOG PERFORMANCE STATUS: 1 - Symptomatic but completely ambulatory  Vitals:   03/21/22 0954  BP: (!) 122/50  Pulse: 80  Resp: 16  Temp: 98.6 F (37 C)  SpO2: 100%   Wt Readings from Last 3 Encounters:  03/21/22 118 lb 3.2 oz (53.6 kg)  03/07/22 116 lb 11.2 oz (52.9 kg)  02/21/22  116 lb 3.2 oz (52.7 kg)     GENERAL:alert, no distress and comfortable SKIN: skin color normal, no rashes or significant lesions EYES:  normal, Conjunctiva are pink and non-injected, sclera clear  NEURO: alert & oriented x 3 with fluent speech  LABORATORY DATA:  I have reviewed the data as listed    Latest Ref Rng & Units 03/21/2022    9:17 AM 03/14/2022    8:23 AM 03/07/2022    7:46 AM  CBC  WBC 4.0 - 10.5 K/uL 3.1  2.7  3.5   Hemoglobin 12.0 - 15.0 g/dL 8.6  9.1  7.7   Hematocrit 36.0 - 46.0 % 24.9  26.1  22.4   Platelets 150 - 400 K/uL 100  64  79         Latest Ref Rng & Units 03/21/2022    9:17 AM 03/14/2022    8:23 AM 03/07/2022    7:46 AM  CMP  Glucose 70 - 99 mg/dL 204  147  153   BUN 8 - 23 mg/dL '18  14  15   '$ Creatinine 0.44 - 1.00 mg/dL 0.57  0.53  0.55   Sodium 135 - 145 mmol/L 136  139  137   Potassium 3.5 - 5.1 mmol/L 3.9  3.9  3.9   Chloride 98 - 111 mmol/L 101  105  104   CO2 22 - 32 mmol/L '29  28  27   '$ Calcium 8.9 - 10.3 mg/dL 9.4  9.2  9.4   Total Protein 6.5 - 8.1 g/dL 8.7  7.6  7.8   Total Bilirubin 0.3 - 1.2 mg/dL 0.6  0.6  0.5   Alkaline Phos 38 - 126 U/L 109  90  96   AST 15 - 41 U/L '11  12  11   '$ ALT 0 - 44 U/L '8  6  5       '$ RADIOGRAPHIC STUDIES: I have personally reviewed the radiological images as listed and agreed with the findings in the report. No results found.    Orders Placed This Encounter  Procedures   CBC with Differential ([place] Only)    Standing Status:   Future    Standing Expiration Date:   [id number]   Basic Metabolic Panel - [place] Only    Standing Status:   Future    Standing Expiration Date:   03/22/2023   CBC with Differential ([place] Only)    Standing Status:   Future    Standing Expiration Date:   123456   Basic Metabolic Panel - [place] Only    Standing Status:   Future    Standing Expiration Date:   04/19/2023   All questions were answered. The patient knows to call the clinic with any  problems, questions or concerns. No barriers to learning was detected. The total time spent in the appointment was 30 minutes.     [name], MD 03/21/2022   Felicity Coyer, CMA, am acting as scribe for [name], MD.   I have reviewed the above documentation for accuracy and completeness, and I agree with the above.

## 2022-03-18 NOTE — Telephone Encounter (Signed)
   Telephone encounter was:  Successful.  03/18/2022 Name: Amanda Duarte MRN: [id number] DOB: [birth year]  Amanda Duarte is a 67 y.o. year old female who is a primary care patient of [name], NP . The community resource team was consulted for assistance with Food Insecurity and Financial Difficulties related to financial strain  Care guide performed the following interventions: Patient provided with information about care guide support team and interviewed to confirm resource needs. Patient is in need of food and financial resources and wants it to be mailed to her.  Follow Up Plan:  Care guide will follow up with patient by phone over the next week    [place], [place] [address], [place], [place] [address] Phone: [phone] Email: [name]

## 2022-03-20 NOTE — Assessment & Plan Note (Signed)
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 She had excellent response. -most recent right mammogram on 07/26/21 and right axilla UKoreaon 10/27/21 were benign. -she is on surveillance now

## 2022-03-20 NOTE — Assessment & Plan Note (Signed)
-  Continue medication and follow-up with PCP

## 2022-03-20 NOTE — Assessment & Plan Note (Signed)
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  Her FISH test showed 3 abnormal type, her risk of transforming to acute leukemia is very high, likely in the next 6 to 12 months -She started azacitidine injection on November 15, 2021, she was admitted to hospital after chemo, for severe profound pancytopenia, aspiration pneumonia, required intubation. She has recovered well  -Her blood counts improved after first cycle chemotherapy, likely she responded to treatment. -she started cycle 2 azacitidine on 01/25/2022, will reduce to 4 days for this cycle. If she tolerates well, will change to 5 days from next cyle  -She was evaluated for bone marrow transplant at [facility] and is open to transplant. We recommend continuing chemo for now, and reserve transplant when she progress. She is tolerating Azacitadine well.

## 2022-03-21 ENCOUNTER — Other Ambulatory Visit: Payer: Self-pay

## 2022-03-21 ENCOUNTER — Inpatient Hospital Stay: Payer: Medicare Other

## 2022-03-21 ENCOUNTER — Encounter: Payer: Self-pay | Admitting: Hematology

## 2022-03-21 ENCOUNTER — Inpatient Hospital Stay (HOSPITAL_BASED_OUTPATIENT_CLINIC_OR_DEPARTMENT_OTHER): Payer: Medicare Other | Admitting: Hematology

## 2022-03-21 VITALS — BP 122/50 | HR 80 | Temp 98.6°F | Resp 16 | Ht 60.0 in | Wt 118.2 lb

## 2022-03-21 DIAGNOSIS — Z170 Estrogen receptor positive status [ER+]: Secondary | ICD-10-CM

## 2022-03-21 DIAGNOSIS — C50212 Malignant neoplasm of upper-inner quadrant of left female breast: Secondary | ICD-10-CM

## 2022-03-21 DIAGNOSIS — D46Z Other myelodysplastic syndromes: Secondary | ICD-10-CM | POA: Diagnosis not present

## 2022-03-21 DIAGNOSIS — Z5111 Encounter for antineoplastic chemotherapy: Secondary | ICD-10-CM | POA: Diagnosis not present

## 2022-03-21 DIAGNOSIS — E1169 Type 2 diabetes mellitus with other specified complication: Secondary | ICD-10-CM | POA: Diagnosis not present

## 2022-03-21 DIAGNOSIS — E785 Hyperlipidemia, unspecified: Secondary | ICD-10-CM | POA: Diagnosis not present

## 2022-03-21 DIAGNOSIS — D649 Anemia, unspecified: Secondary | ICD-10-CM

## 2022-03-21 LAB — CBC WITH DIFFERENTIAL (CANCER CENTER ONLY)
Abs Immature Granulocytes: 0.01 10*3/uL (ref 0.00–0.07)
Basophils Absolute: 0.1 10*3/uL (ref 0.0–0.1)
Basophils Relative: 2 %
Eosinophils Absolute: 0.6 10*3/uL — ABNORMAL HIGH (ref 0.0–0.5)
Eosinophils Relative: 18 %
HCT: 24.9 % — ABNORMAL LOW (ref 36.0–46.0)
Hemoglobin: 8.6 g/dL — ABNORMAL LOW (ref 12.0–15.0)
Immature Granulocytes: 0 %
Lymphocytes Relative: 54 %
Lymphs Abs: 1.7 10*3/uL (ref 0.7–4.0)
MCH: 29.5 pg (ref 26.0–34.0)
MCHC: 34.5 g/dL (ref 30.0–36.0)
MCV: 85.3 fL (ref 80.0–100.0)
Monocytes Absolute: 0.2 10*3/uL (ref 0.1–1.0)
Monocytes Relative: 5 %
Neutro Abs: 0.7 10*3/uL — ABNORMAL LOW (ref 1.7–7.7)
Neutrophils Relative %: 21 %
Platelet Count: 100 10*3/uL — ABNORMAL LOW (ref 150–400)
RBC: 2.92 MIL/uL — ABNORMAL LOW (ref 3.87–5.11)
RDW: 13.2 % (ref 11.5–15.5)
WBC Count: 3.1 10*3/uL — ABNORMAL LOW (ref 4.0–10.5)
nRBC: 0 % (ref 0.0–0.2)

## 2022-03-21 LAB — CMP (CANCER CENTER ONLY)
ALT: 8 U/L (ref 0–44)
AST: 11 U/L — ABNORMAL LOW (ref 15–41)
Albumin: 4 g/dL (ref 3.5–5.0)
Alkaline Phosphatase: 109 U/L (ref 38–126)
Anion gap: 6 (ref 5–15)
BUN: 18 mg/dL (ref 8–23)
CO2: 29 mmol/L (ref 22–32)
Calcium: 9.4 mg/dL (ref 8.9–10.3)
Chloride: 101 mmol/L (ref 98–111)
Creatinine: 0.57 mg/dL (ref 0.44–1.00)
GFR, Estimated: >60 mL/min (ref >60)
Glucose, Bld: 204 mg/dL — ABNORMAL HIGH (ref 70–99)
Potassium: 3.9 mmol/L (ref 3.5–5.1)
Sodium: 136 mmol/L (ref 135–145)
Total Bilirubin: 0.6 mg/dL (ref 0.3–1.2)
Total Protein: 8.7 g/dL — ABNORMAL HIGH (ref 6.5–8.1)

## 2022-03-21 LAB — SAMPLE TO BLOOD BANK

## 2022-03-21 MED ORDER — ONDANSETRON HCL 8 MG PO TABS
8.0000 mg | ORAL_TABLET | Freq: Once | ORAL | Status: AC
  Administered 2022-03-21: 8 mg via ORAL
  Filled 2022-03-21: qty 1

## 2022-03-21 MED ORDER — AZACITIDINE CHEMO SQ INJECTION
112.5000 mg | Freq: Once | INTRAMUSCULAR | Status: AC
  Administered 2022-03-21: 112.5 mg via SUBCUTANEOUS
  Filled 2022-03-21: qty 4.5

## 2022-03-21 NOTE — Patient Instructions (Signed)
Instrucciones al darle de alta: Discharge Instructions Gracias por elegir al [facility] de Cncer de [place] para brindarle atencin mdica de oncologa y Music therapist.   Si usted tiene una cita de laboratorio con [place], por favor vaya directamente [place] y regstrese en el rea de Control and instrumentation engineer.   Use ropa cmoda y [place] para tener fcil acceso a las vas del Portacath (acceso venoso de Engineer, site duracin) o la lnea PICC (catter central colocado por va perifrica).   Nos esforzamos por ofrecerle tiempo de calidad con su proveedor. Es posible que tenga que volver a programar su cita si llega tarde (15 minutos o ms).  El llegar tarde le afecta a usted y a otros pacientes cuyas citas son posteriores a Merchandiser, retail.  Adems, si usted falta a tres o ms citas sin avisar a la oficina, puede ser retirado(a) de la clnica a discrecin del proveedor.      Para las solicitudes de renovacin de recetas, pida a su farmacia que se ponga en contacto con nuestra oficina y deje que transcurran 36 horas para que se complete el proceso de las renovaciones.    Hoy usted recibi los siguientes agentes de quimioterapia e/o inmunoterapia Vidaza.      Para ayudar a prevenir las nuseas y los vmitos despus de su tratamiento, le recomendamos que tome su medicamento para las nuseas segn las indicaciones.  LOS SNTOMAS QUE DEBEN COMUNICARSE INMEDIATAMENTE SE INDICAN A CONTINUACIN: *FIEBRE SUPERIOR A 100.4 F (38 C) O MS *ESCALOFROS O SUDORACIN *NUSEAS Y VMITOS QUE NO SE CONTROLAN CON EL MEDICAMENTO PARA LAS NUSEAS *DIFICULTAD INUSUAL PARA RESPIRAR  *MORETONES O HEMORRAGIAS NO HABITUALES *PROBLEMAS URINARIOS (dolor o ardor al Garment/textile technologist o frecuencia para Garment/textile technologist) *PROBLEMAS INTESTINALES (diarrea inusual, estreimiento, dolor cerca del ano) SENSIBILIDAD EN LA BOCA Y EN LA GARGANTA CON O SIN LA PRESENCIA DE LCERAS (dolor de garganta, llagas en la boca o dolor de muelas/dientes) ERUPCIN,  HINCHAZN O DOLORES INUSUALES FLUJO VAGINAL INUSUAL O PICAZN/RASQUIA    Los puntos marcados con un asterisco ( *) indican una posible emergencia y debe hacer un seguimiento tan pronto como le sea posible o vaya al Departamento de Emergencias si se le presenta algn problema.  Por favor, muestre la [place] DE ADVERTENCIA DE Windy Canny DE ADVERTENCIA DE Benay Spice al registrarse en [address] de Emergencias y a la enfermera de triaje.  Si tiene preguntas despus de su visita o necesita cancelar o volver a programar su cita, por favor pngase en contacto con [place]  Dept: [phone] y [place] instrucciones. Las horas de oficina son de 8:00 a.m. a 4:30 p.m. de lunes a viernes. Por favor, tenga en cuenta que los mensajes de voz que se dejan despus de las 4:00 p.m. posiblemente no se devolvern hasta el siguiente da de [place].  Cerramos los fines de semana y [employer]. En todo momento tiene acceso a una enfermera para preguntas urgentes. Por favor, llame al nmero principal de la clnica Dept: [phone] y [place] instrucciones.   Para cualquier pregunta que no sea de carcter urgente, tambin puede ponerse en contacto con su proveedor [employer] Ahora ofrecemos visitas electrnicas para cualquier persona mayor de 18 aos que solicite atencin mdica en lnea para los sntomas que no sean urgentes. Para ms detalles vaya a [email or web address].   Tambin puede bajar la aplicacin de MyChart! Vaya a la tienda de aplicaciones, busque "MyChart", abra la  aplicacin, seleccione [place], e ingrese con su nombre de usuario y la contrasea de Pharmacist, community.

## 2022-03-21 NOTE — Progress Notes (Signed)
Per [name] ok to treat with ANC of 0.7 today.

## 2022-03-22 ENCOUNTER — Other Ambulatory Visit: Payer: Self-pay | Admitting: Hematology and Oncology

## 2022-03-22 ENCOUNTER — Other Ambulatory Visit: Payer: Self-pay | Admitting: Hematology

## 2022-03-22 ENCOUNTER — Inpatient Hospital Stay: Payer: Medicare Other

## 2022-03-22 VITALS — BP 119/51 | HR 86 | Temp 98.3°F | Resp 18 | Wt 120.0 lb

## 2022-03-22 DIAGNOSIS — Z170 Estrogen receptor positive status [ER+]: Secondary | ICD-10-CM

## 2022-03-22 DIAGNOSIS — Z5111 Encounter for antineoplastic chemotherapy: Secondary | ICD-10-CM | POA: Diagnosis not present

## 2022-03-22 MED ORDER — AZACITIDINE CHEMO SQ INJECTION
112.5000 mg | Freq: Once | INTRAMUSCULAR | Status: AC
  Administered 2022-03-22: 112.5 mg via SUBCUTANEOUS
  Filled 2022-03-22: qty 4.5

## 2022-03-22 MED ORDER — ONDANSETRON HCL 8 MG PO TABS
8.0000 mg | ORAL_TABLET | Freq: Once | ORAL | Status: AC
  Administered 2022-03-22: 8 mg via ORAL
  Filled 2022-03-22: qty 1

## 2022-03-22 NOTE — Patient Instructions (Signed)
Instrucciones al darle de alta: Discharge Instructions Gracias por elegir al [facility] de Cncer de [place] para brindarle atencin mdica de oncologa y Music therapist.   Si usted tiene una cita de laboratorio con [place], por favor vaya directamente [place] y regstrese en el rea de Control and instrumentation engineer.   Use ropa cmoda y [place] para tener fcil acceso a las vas del Portacath (acceso venoso de Engineer, site duracin) o la lnea PICC (catter central colocado por va perifrica).   Nos esforzamos por ofrecerle tiempo de calidad con su proveedor. Es posible que tenga que volver a programar su cita si llega tarde (15 minutos o ms).  El llegar tarde le afecta a usted y a otros pacientes cuyas citas son posteriores a Merchandiser, retail.  Adems, si usted falta a tres o ms citas sin avisar a la oficina, puede ser retirado(a) de la clnica a discrecin del proveedor.      Para las solicitudes de renovacin de recetas, pida a su farmacia que se ponga en contacto con nuestra oficina y deje que transcurran 59 horas para que se complete el proceso de las renovaciones.    Hoy usted recibi los siguientes agentes de quimioterapia e/o inmunoterapia: Vidaza        Para ayudar a prevenir las nuseas y los vmitos despus de su tratamiento, le recomendamos que tome su medicamento para las nuseas segn las indicaciones.  LOS SNTOMAS QUE DEBEN COMUNICARSE INMEDIATAMENTE SE INDICAN A CONTINUACIN: *FIEBRE SUPERIOR A 100.4 F (38 C) O MS *ESCALOFROS O SUDORACIN *NUSEAS Y VMITOS QUE NO SE CONTROLAN CON EL MEDICAMENTO PARA LAS NUSEAS *DIFICULTAD INUSUAL PARA RESPIRAR  *MORETONES O HEMORRAGIAS NO HABITUALES *PROBLEMAS URINARIOS (dolor o ardor al Garment/textile technologist o frecuencia para Garment/textile technologist) *PROBLEMAS INTESTINALES (diarrea inusual, estreimiento, dolor cerca del ano) SENSIBILIDAD EN LA BOCA Y EN LA GARGANTA CON O SIN LA PRESENCIA DE LCERAS (dolor de garganta, llagas en la boca o dolor de muelas/dientes) ERUPCIN,  HINCHAZN O DOLORES INUSUALES FLUJO VAGINAL INUSUAL O PICAZN/RASQUIA    Los puntos marcados con un asterisco ( *) indican una posible emergencia y debe hacer un seguimiento tan pronto como le sea posible o vaya al Departamento de Emergencias si se le presenta algn problema.  Por favor, muestre la [place] DE ADVERTENCIA DE Windy Canny DE ADVERTENCIA DE Benay Spice al registrarse en [address] de Emergencias y a la enfermera de triaje.  Si tiene preguntas despus de su visita o necesita cancelar o volver a programar su cita, por favor pngase en contacto con [place]  Dept: [phone] y [place] instrucciones. Las horas de oficina son de 8:00 a.m. a 4:30 p.m. de lunes a viernes. Por favor, tenga en cuenta que los mensajes de voz que se dejan despus de las 4:00 p.m. posiblemente no se devolvern hasta el siguiente da de [place].  Cerramos los fines de semana y [employer]. En todo momento tiene acceso a una enfermera para preguntas urgentes. Por favor, llame al nmero principal de la clnica Dept: [phone] y [place] instrucciones.   Para cualquier pregunta que no sea de carcter urgente, tambin puede ponerse en contacto con su proveedor [employer]. Ahora ofrecemos visitas electrnicas para cualquier persona mayor de 18 aos que solicite atencin mdica en lnea para los sntomas que no sean urgentes. Para ms detalles vaya a mychart.[email or web address].   Tambin puede bajar la aplicacin de MyChart! Vaya a la tienda de aplicaciones, busque "MyChart",  abra la aplicacin, seleccione [place], e ingrese con su nombre de usuario y la contrasea de Pharmacist, community.

## 2022-03-23 ENCOUNTER — Inpatient Hospital Stay: Payer: Medicare Other

## 2022-03-23 ENCOUNTER — Telehealth: Payer: Self-pay

## 2022-03-23 ENCOUNTER — Other Ambulatory Visit: Payer: Self-pay

## 2022-03-23 VITALS — BP 114/47 | HR 87 | Temp 98.5°F | Resp 17

## 2022-03-23 DIAGNOSIS — Z5111 Encounter for antineoplastic chemotherapy: Secondary | ICD-10-CM | POA: Diagnosis not present

## 2022-03-23 DIAGNOSIS — C50212 Malignant neoplasm of upper-inner quadrant of left female breast: Secondary | ICD-10-CM

## 2022-03-23 MED ORDER — ONDANSETRON HCL 8 MG PO TABS
8.0000 mg | ORAL_TABLET | Freq: Once | ORAL | Status: AC
  Administered 2022-03-23: 8 mg via ORAL
  Filled 2022-03-23: qty 1

## 2022-03-23 MED ORDER — AZACITIDINE CHEMO SQ INJECTION
112.5000 mg | Freq: Once | INTRAMUSCULAR | Status: AC
  Administered 2022-03-23: 112.5 mg via SUBCUTANEOUS
  Filled 2022-03-23: qty 4.5

## 2022-03-23 NOTE — Telephone Encounter (Signed)
Opened in Error.

## 2022-03-24 ENCOUNTER — Inpatient Hospital Stay: Payer: Medicare Other

## 2022-03-24 ENCOUNTER — Encounter: Payer: Self-pay | Admitting: Hematology and Oncology

## 2022-03-24 VITALS — BP 122/43 | HR 83 | Temp 98.1°F | Resp 18

## 2022-03-24 DIAGNOSIS — Z170 Estrogen receptor positive status [ER+]: Secondary | ICD-10-CM

## 2022-03-24 DIAGNOSIS — Z5111 Encounter for antineoplastic chemotherapy: Secondary | ICD-10-CM | POA: Diagnosis not present

## 2022-03-24 MED ORDER — ONDANSETRON HCL 8 MG PO TABS
8.0000 mg | ORAL_TABLET | Freq: Once | ORAL | Status: AC
  Administered 2022-03-24: 8 mg via ORAL
  Filled 2022-03-24: qty 1

## 2022-03-24 MED ORDER — AZACITIDINE CHEMO SQ INJECTION
112.5000 mg | Freq: Once | INTRAMUSCULAR | Status: AC
  Administered 2022-03-24: 112.5 mg via SUBCUTANEOUS
  Filled 2022-03-24: qty 4.5

## 2022-03-24 NOTE — Progress Notes (Signed)
Patient requested at check-in to receive a phone call from me regarding an expense submitted.  Called patient and left voicemail with my name and direct number to return my call.

## 2022-03-24 NOTE — Patient Instructions (Signed)
Instrucciones al darle de alta: Discharge Instructions Gracias por elegir al [facility] de Cncer de [place] para brindarle atencin mdica de oncologa y Music therapist.   Si usted tiene una cita de laboratorio con [place], por favor vaya directamente [place] y regstrese en el rea de Control and instrumentation engineer.   Use ropa cmoda y [place] para tener fcil acceso a las vas del Portacath (acceso venoso de Engineer, site duracin) o la lnea PICC (catter central colocado por va perifrica).   Nos esforzamos por ofrecerle tiempo de calidad con su proveedor. Es posible que tenga que volver a programar su cita si llega tarde (15 minutos o ms).  El llegar tarde le afecta a usted y a otros pacientes cuyas citas son posteriores a Merchandiser, retail.  Adems, si usted falta a tres o ms citas sin avisar a la oficina, puede ser retirado(a) de la clnica a discrecin del proveedor.      Para las solicitudes de renovacin de recetas, pida a su farmacia que se ponga en contacto con nuestra oficina y deje que transcurran 51 horas para que se complete el proceso de las renovaciones.    Hoy usted recibi los siguientes agentes de quimioterapia e/o inmunoterapia: Vidaza        Para ayudar a prevenir las nuseas y los vmitos despus de su tratamiento, le recomendamos que tome su medicamento para las nuseas segn las indicaciones.  LOS SNTOMAS QUE DEBEN COMUNICARSE INMEDIATAMENTE SE INDICAN A CONTINUACIN: *FIEBRE SUPERIOR A 100.4 F (38 C) O MS *ESCALOFROS O SUDORACIN *NUSEAS Y VMITOS QUE NO SE CONTROLAN CON EL MEDICAMENTO PARA LAS NUSEAS *DIFICULTAD INUSUAL PARA RESPIRAR  *MORETONES O HEMORRAGIAS NO HABITUALES *PROBLEMAS URINARIOS (dolor o ardor al Garment/textile technologist o frecuencia para Garment/textile technologist) *PROBLEMAS INTESTINALES (diarrea inusual, estreimiento, dolor cerca del ano) SENSIBILIDAD EN LA BOCA Y EN LA GARGANTA CON O SIN LA PRESENCIA DE LCERAS (dolor de garganta, llagas en la boca o dolor de muelas/dientes) ERUPCIN,  HINCHAZN O DOLORES INUSUALES FLUJO VAGINAL INUSUAL O PICAZN/RASQUIA    Los puntos marcados con un asterisco ( *) indican una posible emergencia y debe hacer un seguimiento tan pronto como le sea posible o vaya al Departamento de Emergencias si se le presenta algn problema.  Por favor, muestre la [place] DE ADVERTENCIA DE Windy Canny DE ADVERTENCIA DE Benay Spice al registrarse en [address] de Emergencias y a la enfermera de triaje.  Si tiene preguntas despus de su visita o necesita cancelar o volver a programar su cita, por favor pngase en contacto con [place]  Dept: [phone] y [place] instrucciones. Las horas de oficina son de 8:00 a.m. a 4:30 p.m. de lunes a viernes. Por favor, tenga en cuenta que los mensajes de voz que se dejan despus de las 4:00 p.m. posiblemente no se devolvern hasta el siguiente da de [place].  Cerramos los fines de semana y [employer]. En todo momento tiene acceso a una enfermera para preguntas urgentes. Por favor, llame al nmero principal de la clnica Dept: [phone] y [place] instrucciones.   Para cualquier pregunta que no sea de carcter urgente, tambin puede ponerse en contacto con su proveedor [employer]. Ahora ofrecemos visitas electrnicas para cualquier persona mayor de 18 aos que solicite atencin mdica en lnea para los sntomas que no sean urgentes. Para ms detalles vaya a mychart.[email or web address].   Tambin puede bajar la aplicacin de MyChart! Vaya a la tienda de aplicaciones, busque "MyChart",  abra la aplicacin, seleccione [place], e ingrese con su nombre de usuario y la contrasea de Pharmacist, community.

## 2022-03-25 ENCOUNTER — Inpatient Hospital Stay: Payer: Medicare Other | Attending: Hematology

## 2022-03-25 VITALS — BP 113/48 | HR 85 | Temp 98.8°F | Resp 16

## 2022-03-25 DIAGNOSIS — D469 Myelodysplastic syndrome, unspecified: Secondary | ICD-10-CM | POA: Insufficient documentation

## 2022-03-25 DIAGNOSIS — Z5111 Encounter for antineoplastic chemotherapy: Secondary | ICD-10-CM | POA: Insufficient documentation

## 2022-03-25 DIAGNOSIS — C50212 Malignant neoplasm of upper-inner quadrant of left female breast: Secondary | ICD-10-CM

## 2022-03-25 MED ORDER — AZACITIDINE CHEMO SQ INJECTION
112.5000 mg | Freq: Once | INTRAMUSCULAR | Status: AC
  Administered 2022-03-25: 112.5 mg via SUBCUTANEOUS
  Filled 2022-03-25: qty 4.5

## 2022-03-25 MED ORDER — ONDANSETRON HCL 8 MG PO TABS
8.0000 mg | ORAL_TABLET | Freq: Once | ORAL | Status: AC
  Administered 2022-03-25: 8 mg via ORAL
  Filled 2022-03-25: qty 1

## 2022-03-25 NOTE — Progress Notes (Signed)
Angel patient is working with [name] through the [employer] appointment on 3/7  [place]: [phone]

## 2022-03-25 NOTE — Patient Instructions (Signed)
Instrucciones al darle de alta: Discharge Instructions Gracias por elegir al [facility] de Cncer de [place] para brindarle atencin mdica de oncologa y Music therapist.   Si usted tiene una cita de laboratorio con [place], por favor vaya directamente [place] y regstrese en el rea de Control and instrumentation engineer.   Use ropa cmoda y [place] para tener fcil acceso a las vas del Portacath (acceso venoso de Engineer, site duracin) o la lnea PICC (catter central colocado por va perifrica).   Nos esforzamos por ofrecerle tiempo de calidad con su proveedor. Es posible que tenga que volver a programar su cita si llega tarde (15 minutos o ms).  El llegar tarde le afecta a usted y a otros pacientes cuyas citas son posteriores a Merchandiser, retail.  Adems, si usted falta a tres o ms citas sin avisar a la oficina, puede ser retirado(a) de la clnica a discrecin del proveedor.      Para las solicitudes de renovacin de recetas, pida a su farmacia que se ponga en contacto con nuestra oficina y deje que transcurran 62 horas para que se complete el proceso de las renovaciones.    Hoy usted recibi los siguientes agentes de quimioterapia e/o inmunoterapia: azacitadine      Para ayudar a prevenir las nuseas y los vmitos despus de su tratamiento, le recomendamos que tome su medicamento para las nuseas segn las indicaciones.  LOS SNTOMAS QUE DEBEN COMUNICARSE INMEDIATAMENTE SE INDICAN A CONTINUACIN: *FIEBRE SUPERIOR A 100.4 F (38 C) O MS *ESCALOFROS O SUDORACIN *NUSEAS Y VMITOS QUE NO SE CONTROLAN CON EL MEDICAMENTO PARA LAS NUSEAS *DIFICULTAD INUSUAL PARA RESPIRAR  *MORETONES O HEMORRAGIAS NO HABITUALES *PROBLEMAS URINARIOS (dolor o ardor al Garment/textile technologist o frecuencia para Garment/textile technologist) *PROBLEMAS INTESTINALES (diarrea inusual, estreimiento, dolor cerca del ano) SENSIBILIDAD EN LA BOCA Y EN LA GARGANTA CON O SIN LA PRESENCIA DE LCERAS (dolor de garganta, llagas en la boca o dolor de muelas/dientes) ERUPCIN,  HINCHAZN O DOLORES INUSUALES FLUJO VAGINAL INUSUAL O PICAZN/RASQUIA    Los puntos marcados con un asterisco ( *) indican una posible emergencia y debe hacer un seguimiento tan pronto como le sea posible o vaya al Departamento de Emergencias si se le presenta algn problema.  Por favor, muestre la [place] DE ADVERTENCIA DE Windy Canny DE ADVERTENCIA DE Benay Spice al registrarse en [address] de Emergencias y a la enfermera de triaje.  Si tiene preguntas despus de su visita o necesita cancelar o volver a programar su cita, por favor pngase en contacto con [place]  Dept: [phone] y [place] instrucciones. Las horas de oficina son de 8:00 a.m. a 4:30 p.m. de lunes a viernes. Por favor, tenga en cuenta que los mensajes de voz que se dejan despus de las 4:00 p.m. posiblemente no se devolvern hasta el siguiente da de [place].  Cerramos los fines de semana y [employer]. En todo momento tiene acceso a una enfermera para preguntas urgentes. Por favor, llame al nmero principal de la clnica Dept: [phone] y [place] instrucciones.   Para cualquier pregunta que no sea de carcter urgente, tambin puede ponerse en contacto con su proveedor [employer]. Ahora ofrecemos visitas electrnicas para cualquier persona mayor de 18 aos que solicite atencin mdica en lnea para los sntomas que no sean urgentes. Para ms detalles vaya a mychart.[email or web address].   Tambin puede bajar la aplicacin de MyChart! Vaya a la tienda de aplicaciones, busque "MyChart", abra la  aplicacin, seleccione , e ingrese con su nombre de usuario y la contrasea de Pharmacist, community.

## 2022-03-28 ENCOUNTER — Inpatient Hospital Stay: Payer: Medicare Other

## 2022-03-28 ENCOUNTER — Telehealth: Payer: Self-pay

## 2022-03-28 ENCOUNTER — Other Ambulatory Visit: Payer: Self-pay

## 2022-03-28 ENCOUNTER — Other Ambulatory Visit: Payer: Self-pay | Admitting: Hematology

## 2022-03-28 DIAGNOSIS — D649 Anemia, unspecified: Secondary | ICD-10-CM

## 2022-03-28 DIAGNOSIS — D46Z Other myelodysplastic syndromes: Secondary | ICD-10-CM

## 2022-03-28 DIAGNOSIS — Z170 Estrogen receptor positive status [ER+]: Secondary | ICD-10-CM

## 2022-03-28 DIAGNOSIS — Z5111 Encounter for antineoplastic chemotherapy: Secondary | ICD-10-CM | POA: Diagnosis not present

## 2022-03-28 LAB — CMP (CANCER CENTER ONLY)
ALT: 6 U/L (ref 0–44)
AST: 11 U/L — ABNORMAL LOW (ref 15–41)
Albumin: 3.8 g/dL (ref 3.5–5.0)
Alkaline Phosphatase: 125 U/L (ref 38–126)
Anion gap: 6 (ref 5–15)
BUN: 19 mg/dL (ref 8–23)
CO2: 26 mmol/L (ref 22–32)
Calcium: 9.4 mg/dL (ref 8.9–10.3)
Chloride: 101 mmol/L (ref 98–111)
Creatinine: 0.65 mg/dL (ref 0.44–1.00)
GFR, Estimated: >60 mL/min (ref >60)
Glucose, Bld: 311 mg/dL — ABNORMAL HIGH (ref 70–99)
Potassium: 4.1 mmol/L (ref 3.5–5.1)
Sodium: 133 mmol/L — ABNORMAL LOW (ref 135–145)
Total Bilirubin: 0.5 mg/dL (ref 0.3–1.2)
Total Protein: 8.3 g/dL — ABNORMAL HIGH (ref 6.5–8.1)

## 2022-03-28 LAB — CBC WITH DIFFERENTIAL (CANCER CENTER ONLY)
Abs Immature Granulocytes: 0 10*3/uL (ref 0.00–0.07)
Basophils Absolute: 0 10*3/uL (ref 0.0–0.1)
Basophils Relative: 1 %
Eosinophils Absolute: 0.8 10*3/uL — ABNORMAL HIGH (ref 0.0–0.5)
Eosinophils Relative: 24 %
HCT: 22.2 % — ABNORMAL LOW (ref 36.0–46.0)
Hemoglobin: 7.7 g/dL — ABNORMAL LOW (ref 12.0–15.0)
Immature Granulocytes: 0 %
Lymphocytes Relative: 50 %
Lymphs Abs: 1.6 10*3/uL (ref 0.7–4.0)
MCH: 29.4 pg (ref 26.0–34.0)
MCHC: 34.7 g/dL (ref 30.0–36.0)
MCV: 84.7 fL (ref 80.0–100.0)
Monocytes Absolute: 0.1 10*3/uL (ref 0.1–1.0)
Monocytes Relative: 4 %
Neutro Abs: 0.7 10*3/uL — ABNORMAL LOW (ref 1.7–7.7)
Neutrophils Relative %: 21 %
Platelet Count: 126 10*3/uL — ABNORMAL LOW (ref 150–400)
RBC: 2.62 MIL/uL — ABNORMAL LOW (ref 3.87–5.11)
RDW: 13.4 % (ref 11.5–15.5)
WBC Count: 3.3 10*3/uL — ABNORMAL LOW (ref 4.0–10.5)
nRBC: 0 % (ref 0.0–0.2)

## 2022-03-28 LAB — PREPARE RBC (CROSSMATCH)

## 2022-03-28 LAB — SAMPLE TO BLOOD BANK

## 2022-03-28 MED ORDER — ACETAMINOPHEN 325 MG PO TABS
650.0000 mg | ORAL_TABLET | Freq: Once | ORAL | Status: AC
  Administered 2022-03-28: 650 mg via ORAL
  Filled 2022-03-28: qty 2

## 2022-03-28 MED ORDER — SODIUM CHLORIDE 0.9% IV SOLUTION
250.0000 mL | Freq: Once | INTRAVENOUS | Status: AC
  Administered 2022-03-28: 250 mL via INTRAVENOUS

## 2022-03-28 MED ORDER — DIPHENHYDRAMINE HCL 25 MG PO CAPS
25.0000 mg | ORAL_CAPSULE | Freq: Once | ORAL | Status: AC
  Administered 2022-03-28: 25 mg via ORAL
  Filled 2022-03-28: qty 1

## 2022-03-28 NOTE — Progress Notes (Signed)
Ok to run blood at 331m/h today per Dr FBurr Medico

## 2022-03-28 NOTE — Telephone Encounter (Signed)
   Mailed resources    [place], [place] [phone] [address], [place], [place] [address] Phone: [phone] Email: [name]

## 2022-03-28 NOTE — Patient Instructions (Signed)
Transfusin de [employer], cuidados posteriores Blood Transfusion, Adult, Care After La siguiente informacin ofrece orientacin sobre cmo cuidarse despus del procedimiento. El mdico tambin podr darle instrucciones ms especficas. Comunquese con el mdico si tiene problemas o preguntas. Qu puedo esperar despus del procedimiento? Despus del procedimiento, es normal tener los siguientes sntomas: Dolor y Tour manager donde se coloc la va intravenosa (IV). Dolor de [place]. Siga estas instrucciones en su casa: Cuidado del lugar de la insercin de la va intravenosa (IV)     Siga las instrucciones del mdico acerca del cuidado del lugar de la insercin de la va intravenosa (IV). Asegrese de hacer lo siguiente: Lvese las manos con agua y jabn durante al menos 20 segundos antes y despus de cambiarse la venda (vendaje). Use desinfectante para manos si no dispone de [place] y [place]. Cambie los vendajes como se lo haya indicado el mdico. Psychiatric nurse de insercin de la va intravenosa (IV) todos los das para descartar signos de infeccin. Est atento a los siguientes signos: Dolor, hinchazn o enrojecimiento. Sangrado proveniente del Environmental consultant. Calor. Pus o mal olor. Instrucciones generales Use los medicamentos de venta libre y los recetados solamente como se lo haya indicado el mdico. Haga reposo como se lo haya indicado el mdico. Retome sus actividades normales como se lo haya indicado el mdico. Concurra a todas las visitas de seguimiento. Es posible que sea necesario realizar anlisis de laboratorio en ciertos perodos para volver a Chief Technology Officer sus recuentos sanguneos. Comunquese con un mdico si: Tiene picazn o zonas enrojecidas e hinchadas en la piel (urticaria). Tiene fiebre o escalofros. Tiene dolor de cabeza, en la espalda o el pecho. Se siente ansioso o dbil despus de realizar sus actividades habituales. Tiene enrojecimiento, hinchazn, calor  o dolor alrededor del lugar de la insercin de la va intravenosa. Observa sangre que sale del lugar de la insercin de la va intravenosa que no se detiene al Oceanographer presin. Tiene pus o percibe mal olor que proviene del lugar de la insercin de la va intravenosa. Si recibi la transfusin de sangre en un entorno para pacientes ambulatorios, le indicarn con quin debe ponerse en contacto para informar cualquier reaccin. Solicite ayuda de inmediato si: Tiene signos de Nurse, mental health grave o de una reaccin del sistema inmunitario, por ejemplo: Dificultad para respirar o falta de aire. Hinchazn de la cara, sensacin de sofoco o erupcin cutnea generalizada. Orina de color oscuro o sangre en la orina. Latidos cardacos acelerados. Estos sntomas pueden Sales executive. Solicite ayuda de inmediato. Llame al 911. No espere a ver si los sntomas desaparecen. No conduzca por sus propios medios [employer] hospital. Resumen Los moretones y Conservation officer, historic buildings alrededor del lugar de la insercin de la va intravenosa (IV) son frecuentes. Controle TEFL teacher de insercin de la va intravenosa (IV) todos los das para descartar signos de infeccin. Haga reposo como se lo haya indicado el mdico. Retome sus actividades normales como se lo haya indicado el mdico. Busque ayuda de inmediato si tiene sntomas de una reaccin alrgica grave o de una reaccin del sistema inmunitario a una transfusin de [place]. Esta informacin no tiene Marine scientist el consejo del mdico. Asegrese de hacerle al mdico cualquier pregunta que tenga. Document Revised: 05/06/2021 Document Reviewed: 05/06/2021 Elsevier Patient Education  [place].

## 2022-03-29 ENCOUNTER — Encounter: Payer: Self-pay | Admitting: Hematology

## 2022-03-29 ENCOUNTER — Other Ambulatory Visit: Payer: Self-pay

## 2022-03-29 LAB — TYPE AND SCREEN
ABO/RH(D): O POS
Antibody Screen: NEGATIVE
Unit division: 0

## 2022-03-29 LAB — BPAM RBC
Blood Product Expiration Date: 202403132359
ISSUE DATE / TIME: 202403041012
Unit Type and Rh: 9500

## 2022-03-31 ENCOUNTER — Encounter: Payer: Medicare Other | Admitting: *Deleted

## 2022-04-04 ENCOUNTER — Inpatient Hospital Stay: Payer: Medicare Other

## 2022-04-04 ENCOUNTER — Other Ambulatory Visit: Payer: Self-pay

## 2022-04-04 DIAGNOSIS — D649 Anemia, unspecified: Secondary | ICD-10-CM

## 2022-04-04 DIAGNOSIS — Z170 Estrogen receptor positive status [ER+]: Secondary | ICD-10-CM

## 2022-04-04 DIAGNOSIS — D46Z Other myelodysplastic syndromes: Secondary | ICD-10-CM

## 2022-04-04 DIAGNOSIS — Z5111 Encounter for antineoplastic chemotherapy: Secondary | ICD-10-CM | POA: Diagnosis not present

## 2022-04-04 LAB — CBC WITH DIFFERENTIAL (CANCER CENTER ONLY)
Abs Immature Granulocytes: 0.01 10*3/uL (ref 0.00–0.07)
Basophils Absolute: 0 10*3/uL (ref 0.0–0.1)
Basophils Relative: 1 %
Eosinophils Absolute: 0.3 10*3/uL (ref 0.0–0.5)
Eosinophils Relative: 12 %
HCT: 24.9 % — ABNORMAL LOW (ref 36.0–46.0)
Hemoglobin: 8.5 g/dL — ABNORMAL LOW (ref 12.0–15.0)
Immature Granulocytes: 0 %
Lymphocytes Relative: 55 %
Lymphs Abs: 1.6 10*3/uL (ref 0.7–4.0)
MCH: 29.6 pg (ref 26.0–34.0)
MCHC: 34.1 g/dL (ref 30.0–36.0)
MCV: 86.8 fL (ref 80.0–100.0)
Monocytes Absolute: 0.1 10*3/uL (ref 0.1–1.0)
Monocytes Relative: 4 %
Neutro Abs: 0.8 10*3/uL — ABNORMAL LOW (ref 1.7–7.7)
Neutrophils Relative %: 28 %
Platelet Count: 80 10*3/uL — ABNORMAL LOW (ref 150–400)
RBC: 2.87 MIL/uL — ABNORMAL LOW (ref 3.87–5.11)
RDW: 13.7 % (ref 11.5–15.5)
WBC Count: 2.9 10*3/uL — ABNORMAL LOW (ref 4.0–10.5)
nRBC: 0 % (ref 0.0–0.2)

## 2022-04-04 LAB — CMP (CANCER CENTER ONLY)
ALT: 8 U/L (ref 0–44)
AST: 14 U/L — ABNORMAL LOW (ref 15–41)
Albumin: 3.7 g/dL (ref 3.5–5.0)
Alkaline Phosphatase: 116 U/L (ref 38–126)
Anion gap: 5 (ref 5–15)
BUN: 17 mg/dL (ref 8–23)
CO2: 29 mmol/L (ref 22–32)
Calcium: 9.3 mg/dL (ref 8.9–10.3)
Chloride: 104 mmol/L (ref 98–111)
Creatinine: 0.67 mg/dL (ref 0.44–1.00)
GFR, Estimated: >60 mL/min (ref >60)
Glucose, Bld: 189 mg/dL — ABNORMAL HIGH (ref 70–99)
Potassium: 3.9 mmol/L (ref 3.5–5.1)
Sodium: 138 mmol/L (ref 135–145)
Total Bilirubin: 0.6 mg/dL (ref 0.3–1.2)
Total Protein: 8 g/dL (ref 6.5–8.1)

## 2022-04-04 LAB — SAMPLE TO BLOOD BANK

## 2022-04-04 NOTE — Progress Notes (Signed)
Patient's lab results do not require any blood products today. Patient provided with copy of labs and discharged from infusion.

## 2022-04-11 ENCOUNTER — Inpatient Hospital Stay: Payer: Medicare Other

## 2022-04-11 ENCOUNTER — Telehealth: Payer: Self-pay

## 2022-04-11 ENCOUNTER — Other Ambulatory Visit: Payer: Self-pay | Admitting: Hematology

## 2022-04-11 ENCOUNTER — Other Ambulatory Visit: Payer: Self-pay

## 2022-04-11 DIAGNOSIS — D649 Anemia, unspecified: Secondary | ICD-10-CM

## 2022-04-11 DIAGNOSIS — D46Z Other myelodysplastic syndromes: Secondary | ICD-10-CM

## 2022-04-11 DIAGNOSIS — C50212 Malignant neoplasm of upper-inner quadrant of left female breast: Secondary | ICD-10-CM

## 2022-04-11 DIAGNOSIS — Z5111 Encounter for antineoplastic chemotherapy: Secondary | ICD-10-CM | POA: Diagnosis not present

## 2022-04-11 LAB — CMP (CANCER CENTER ONLY)
ALT: 8 U/L (ref 0–44)
AST: 13 U/L — ABNORMAL LOW (ref 15–41)
Albumin: 3.8 g/dL (ref 3.5–5.0)
Alkaline Phosphatase: 211 U/L — ABNORMAL HIGH (ref 38–126)
Anion gap: 6 (ref 5–15)
BUN: 16 mg/dL (ref 8–23)
CO2: 28 mmol/L (ref 22–32)
Calcium: 9.4 mg/dL (ref 8.9–10.3)
Chloride: 104 mmol/L (ref 98–111)
Creatinine: 0.59 mg/dL (ref 0.44–1.00)
GFR, Estimated: >60 mL/min (ref >60)
Glucose, Bld: 205 mg/dL — ABNORMAL HIGH (ref 70–99)
Potassium: 3.6 mmol/L (ref 3.5–5.1)
Sodium: 138 mmol/L (ref 135–145)
Total Bilirubin: 0.6 mg/dL (ref 0.3–1.2)
Total Protein: 8.2 g/dL — ABNORMAL HIGH (ref 6.5–8.1)

## 2022-04-11 LAB — CBC WITH DIFFERENTIAL (CANCER CENTER ONLY)
Abs Immature Granulocytes: 0 10*3/uL (ref 0.00–0.07)
Band Neutrophils: 1 %
Basophils Absolute: 0 10*3/uL (ref 0.0–0.1)
Basophils Relative: 1 %
Eosinophils Absolute: 0.5 10*3/uL (ref 0.0–0.5)
Eosinophils Relative: 22 %
HCT: 23.1 % — ABNORMAL LOW (ref 36.0–46.0)
Hemoglobin: 7.8 g/dL — ABNORMAL LOW (ref 12.0–15.0)
Lymphocytes Relative: 62 %
Lymphs Abs: 1.5 10*3/uL (ref 0.7–4.0)
MCH: 29.5 pg (ref 26.0–34.0)
MCHC: 33.8 g/dL (ref 30.0–36.0)
MCV: 87.5 fL (ref 80.0–100.0)
Monocytes Absolute: 0 10*3/uL — ABNORMAL LOW (ref 0.1–1.0)
Monocytes Relative: 1 %
Neutro Abs: 0.3 10*3/uL — CL (ref 1.7–7.7)
Neutrophils Relative %: 13 %
Platelet Count: 52 10*3/uL — ABNORMAL LOW (ref 150–400)
RBC: 2.64 MIL/uL — ABNORMAL LOW (ref 3.87–5.11)
RDW: 14.7 % (ref 11.5–15.5)
WBC Count: 2.4 10*3/uL — ABNORMAL LOW (ref 4.0–10.5)
nRBC: 0 % (ref 0.0–0.2)

## 2022-04-11 LAB — SAMPLE TO BLOOD BANK

## 2022-04-11 LAB — PREPARE RBC (CROSSMATCH)

## 2022-04-11 MED ORDER — DIPHENHYDRAMINE HCL 25 MG PO CAPS
25.0000 mg | ORAL_CAPSULE | Freq: Once | ORAL | Status: AC
  Administered 2022-04-11: 25 mg via ORAL
  Filled 2022-04-11: qty 1

## 2022-04-11 MED ORDER — ACETAMINOPHEN 325 MG PO TABS
650.0000 mg | ORAL_TABLET | Freq: Once | ORAL | Status: AC
  Administered 2022-04-11: 650 mg via ORAL
  Filled 2022-04-11: qty 2

## 2022-04-11 MED ORDER — LEVOFLOXACIN 500 MG PO TABS: 500.0000 mg | ORAL_TABLET | Freq: Every day | ORAL | 1 refills | Status: DC

## 2022-04-11 MED ORDER — SODIUM CHLORIDE 0.9% IV SOLUTION
250.0000 mL | Freq: Once | INTRAVENOUS | Status: AC
  Administered 2022-04-11: 250 mL via INTRAVENOUS

## 2022-04-11 NOTE — Telephone Encounter (Signed)
CRITICAL VALUE STICKER  CRITICAL VALUE:   ANC  0.3  RECEIVER (on-site recipient of call):   Rosann Auerbach  DATE & TIME NOTIFIED: 04/11/22  09:05  MESSENGER (representative from lab):   Rosann Auerbach  MD NOTIFIED: Burr Medico  TIME OF NOTIFICATION:09:08  RESPONSE: aware

## 2022-04-11 NOTE — Patient Instructions (Signed)
Transfusin de sangre en los adultos, cuidados posteriores Blood Transfusion, Adult, Care After La siguiente informacin ofrece orientacin sobre cmo cuidarse despus del procedimiento. El mdico tambin podr darle instrucciones ms especficas. Comunquese con el mdico si tiene problemas o preguntas. Qu puedo esperar despus del procedimiento? Despus del procedimiento, es normal tener los siguientes sntomas: Dolor y moretones en el lugar donde se coloc la va intravenosa (IV). Dolor de cabeza. Siga estas instrucciones en su casa: Cuidado del lugar de la insercin de la va intravenosa (IV)     Siga las instrucciones del mdico acerca del cuidado del lugar de la insercin de la va intravenosa (IV). Asegrese de hacer lo siguiente: Lvese las manos con agua y jabn durante al menos 20 segundos antes y despus de cambiarse la venda (vendaje). Use desinfectante para manos si no dispone de agua y jabn. Cambie los vendajes como se lo haya indicado el mdico. Controle el lugar de insercin de la va intravenosa (IV) todos los das para descartar signos de infeccin. Est atento a los siguientes signos: Dolor, hinchazn o enrojecimiento. Sangrado proveniente del lugar. Calor. Pus o mal olor. Instrucciones generales Use los medicamentos de venta libre y los recetados solamente como se lo haya indicado el mdico. Haga reposo como se lo haya indicado el mdico. Retome sus actividades normales como se lo haya indicado el mdico. Concurra a todas las visitas de seguimiento. Es posible que sea necesario realizar anlisis de laboratorio en ciertos perodos para volver a controlar sus recuentos sanguneos. Comunquese con un mdico si: Tiene picazn o zonas enrojecidas e hinchadas en la piel (urticaria). Tiene fiebre o escalofros. Tiene dolor de cabeza, en la espalda o el pecho. Se siente ansioso o dbil despus de realizar sus actividades habituales. Tiene enrojecimiento, hinchazn, calor  o dolor alrededor del lugar de la insercin de la va intravenosa. Observa sangre que sale del lugar de la insercin de la va intravenosa que no se detiene al ejercer presin. Tiene pus o percibe mal olor que proviene del lugar de la insercin de la va intravenosa. Si recibi la transfusin de sangre en un entorno para pacientes ambulatorios, le indicarn con quin debe ponerse en contacto para informar cualquier reaccin. Solicite ayuda de inmediato si: Tiene signos de una reaccin alrgica grave o de una reaccin del sistema inmunitario, por ejemplo: Dificultad para respirar o falta de aire. Hinchazn de la cara, sensacin de sofoco o erupcin cutnea generalizada. Orina de color oscuro o sangre en la orina. Latidos cardacos acelerados. Estos sntomas pueden indicar una emergencia. Solicite ayuda de inmediato. Llame al 911. No espere a ver si los sntomas desaparecen. No conduzca por sus propios medios hasta el hospital. Resumen Los moretones y el dolor alrededor del lugar de la insercin de la va intravenosa (IV) son frecuentes. Controle el lugar de insercin de la va intravenosa (IV) todos los das para descartar signos de infeccin. Haga reposo como se lo haya indicado el mdico. Retome sus actividades normales como se lo haya indicado el mdico. Busque ayuda de inmediato si tiene sntomas de una reaccin alrgica grave o de una reaccin del sistema inmunitario a una transfusin de sangre. Esta informacin no tiene como fin reemplazar el consejo del mdico. Asegrese de hacerle al mdico cualquier pregunta que tenga. Document Revised: 05/06/2021 Document Reviewed: 05/06/2021 Elsevier Patient Education  2023 Elsevier Inc.   

## 2022-04-12 ENCOUNTER — Ambulatory Visit: Payer: Self-pay | Admitting: *Deleted

## 2022-04-12 ENCOUNTER — Encounter: Payer: Self-pay | Admitting: Hematology

## 2022-04-12 ENCOUNTER — Other Ambulatory Visit: Payer: Self-pay

## 2022-04-12 ENCOUNTER — Encounter: Payer: Self-pay | Admitting: *Deleted

## 2022-04-12 DIAGNOSIS — D469 Myelodysplastic syndrome, unspecified: Secondary | ICD-10-CM

## 2022-04-12 LAB — TYPE AND SCREEN
ABO/RH(D): O POS
Antibody Screen: NEGATIVE
Unit division: 0

## 2022-04-12 LAB — BPAM RBC
Blood Product Expiration Date: 202403292359
ISSUE DATE / TIME: 202403181027
Unit Type and Rh: 9500

## 2022-04-12 MED ORDER — LEVOFLOXACIN 500 MG PO TABS
500.0000 mg | ORAL_TABLET | Freq: Every day | ORAL | 1 refills | Status: DC
  Filled 2022-04-12: qty 30, 30d supply, fill #0

## 2022-04-12 NOTE — Progress Notes (Signed)
Patient called requesting it to be sent to [place]

## 2022-04-13 ENCOUNTER — Other Ambulatory Visit: Payer: Self-pay | Admitting: Nurse Practitioner

## 2022-04-13 ENCOUNTER — Other Ambulatory Visit: Payer: Self-pay

## 2022-04-13 DIAGNOSIS — K219 Gastro-esophageal reflux disease without esophagitis: Secondary | ICD-10-CM

## 2022-04-13 DIAGNOSIS — I10 Essential (primary) hypertension: Secondary | ICD-10-CM

## 2022-04-13 MED ORDER — LOSARTAN POTASSIUM 50 MG PO TABS
50.0000 mg | ORAL_TABLET | Freq: Every day | ORAL | 0 refills | Status: DC
  Filled 2022-04-13 – 2022-05-04 (×2): qty 30, 30d supply, fill #0

## 2022-04-13 MED ORDER — PANTOPRAZOLE SODIUM 40 MG PO TBEC
40.0000 mg | DELAYED_RELEASE_TABLET | Freq: Every day | ORAL | 0 refills | Status: DC
  Filled 2022-04-13: qty 30, 30d supply, fill #0

## 2022-04-14 NOTE — Progress Notes (Signed)
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   Oncology History Overview Note  Cancer Staging Malignant neoplasm of upper-inner quadrant of left breast in female, estrogen receptor positive ([name]) Staging form: Breast, AJCC 8th Edition - Clinical stage from 05/01/2017: Stage Unknown (cTX, cN2, cM0, G3, ER-, PR-, HER2-) - Signed by [name], MD on 11/01/2017 - Pathologic stage from 11/20/2017: No Stage Recommended (ypT0, pN47mi, cM0, GX, ER-, PR-, HER2-) - Signed by [name], MD on 12/10/2017     Malignant neoplasm of upper-inner quadrant of left breast in female, estrogen receptor negative ([place])  04/28/2017 Mammogram   IMPRESSION: Two adjacent masses/enlarged lymph nodes in the LOWER LEFT axilla, the largest measuring 2.1 cm. Tissue sampling of 1 of these is recommended to exclude malignancy/lymphoma. No mammographic evidence of breast malignancy bilaterally.    05/01/2017 Initial Biopsy   Diagnosis 05/01/17 Lymph node, needle/core biopsy, low left inferior axillary - METASTATIC POORLY DIFFERENTIATED CARCINOMA TO A LYMPH NODE. SEE NOTE.   05/01/2017 Cancer Staging   Staging form: Breast, AJCC 8th Edition - Clinical stage from 05/01/2017: Stage Unknown (cTX, cN1, cM0, G3, ER-, PR-, HER2-) - Signed by [name], MD on 06/02/2017    05/01/2017 Receptors her2   Lymph Node Biopsy:  HER2-Negative  PR-Negative  ER- Negative    05/10/2017 Imaging    MR Breast W WO Contrast 05/10/17 IMPRESSION: No MRI evidence of malignancy in the right breast. Area of week stippled non mass enhancement in the left breast upper inner quadrant. Separate area of thin linear non mass enhancement in the subareolar left breast. Three grossly abnormal left axillary lymph nodes, and more than 4 less than 1 cm indeterminate left axillary lymph nodes. No evidence of right axillary lymphadenopathy.   05/17/2017 Initial Biopsy   Diagnosis 05/17/17 1. 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 2. Breast, left, needle core biopsy, upper inner post MR enhancement - MICROSCOPIC FOCUS OF DUCTAL CARCINOMA IN SITU.   05/17/2017 Receptors her2   Left Breast Biopsy:  ER-Negative PR-Negative HER2-Negative   05/22/2017 Initial Diagnosis   Malignant neoplasm of upper-inner quadrant of left breast in female, estrogen receptor positive ([name])   06/01/2017 Imaging   [place] Scan 06/01/17 IMPRESSION: No definite scintigraphic evidence of osseous metastatic disease.   Posttraumatic and postsurgical uptake at the LEFT knee.   Nonspecific soft tissue distribution of tracer at the LEFT thigh, could represent contusion, hemorrhage, soft tissue edema, or soft tissue calcifications such as from heterotopic calcification and myositis ossificans; recommend clinical correlation and consider dedicated LEFT femoral radiographs.   Single focus of nonspecific increased tracer localization at the lateral LEFT orbit.     06/01/2017 Imaging   06/01/2017 Bone Scan IMPRESSION: No definite scintigraphic evidence of osseous metastatic disease.   Posttraumatic and postsurgical uptake at the LEFT knee.   Nonspecific soft tissue distribution of tracer at the LEFT thigh, could represent contusion, hemorrhage, soft tissue edema, or soft tissue calcifications such as from  heterotopic calcification and myositis  ossificans; recommend clinical correlation and consider dedicated LEFT femoral radiographs.   Single focus of nonspecific increased tracer localization at the lateral LEFT orbit.   06/09/2017 -  Chemotherapy   ddAC every 2 weeks for 4 weeks starting 06/09/17-07/21/17 followed by weekly [place] and taxol for 12 weeks 08/04/17-10/20/17.    10/24/2017 Imaging   Breast MRI B/l 10/24/17 IMPRESSION: 1. No residual enhancement in the LEFT breast following neoadjuvant treatment. 2. Significantly smaller LEFT axillary lymph nodes, largest now measuring 1.4 centimeters. The remainder of LEFT axillary lymph nodes demonstrate normal fatty hila. RECOMMENDATION: Treatment plan for known LEFT breast cancer.   11/20/2017 Cancer Staging   Staging form: Breast, AJCC 8th Edition - Pathologic stage from 11/20/2017: No Stage Recommended (ypT0, pN47mi, cM0, GX, ER-, PR-, HER2-) - Signed by [name], MD on 12/10/2017   11/20/2017 Surgery   Left mastectomy and SLN biopsy by Dr. Dalbert Batman    11/20/2017 Pathology Results   Breast, modified radical mastectomy , left - MICROSCOPIC FOCI OF RESIDUAL METASTATIC CARCINOMA INVOLVING TWO OF EIGHT LYMPH NODES (2/8); LARGEST CONTINUOUS FOCUS MEASURES 0.1 CM. - NO EVIDENCE OF RESIDUAL CARCINOMA IN THE MASTECTOMY SPECIMEN - MARKED THERAPY-RELATED CHANGES, INCLUDING DENSE HYALIN FIBROSIS     11/20/2017 Receptors her2   ER- PR- HER2- (IHC 1+)   01/02/2018 - 02/14/2018 Radiation Therapy   Adjuvant Radiation 01/02/18 - 02/14/18   07/31/2018 Imaging   Baseline DEXA 07/31/18  ASSESSMENT: The BMD measured at AP Spine L1-L4 is 0.973 g/cm2 with a T-score of -1.7.   This patient is considered OSTEOPENIC according to [facility]) criteria. The scan quality is good.   Site Region Measured Date Measured Age YA T-score BMD Significant CHANGE   AP Spine  L1-L4      07/31/2018    63.1         -1.7    0.973 g/cm2   DualFemur Neck Left  07/31/2018    63.1          -1.5    0.828 g/cm2   DualFemur Total Mean 07/31/2018    63.1         0.1     1.016 g/cm2   11/05/2018 Survivorship   Per [name], NP    11/15/2021 -  Chemotherapy   Patient is on Treatment Plan : MYELODYSPLASIA  Azacitidine SQ D1-7 q28d     02/03/2022 Imaging    IMPRESSION: No active cardiopulmonary disease. No evidence of pneumonia or pulmonary edema. Possible mild chronic interstitial lung disease.      CURRENT THERAPY: Azacitadine injection, days 1-5, started on 11/15/21  (first cycle 7 days); C2 x4 days, subsequent cycles daily x5 days  INTERVAL HISTORY Ms. Rather returns for follow up as scheduled. Last seen by [name] 03/21/22 and completed cycle 4.  She received a blood transfusion last week.  Overall she feels well, tolerating treatment.  Denies any changes.  She is eating and drinking with adequate energy level/activity.  Neuropathy improved with B vitamin.  Denies nausea, constipation, bruising/bleeding, or any signs of infection.  ROS  All other systems reviewed and negative  Past Medical History:  Diagnosis Date   Cancer ([place]) 03/2017   left breast cancer   Diabetes mellitus without complication ([place])    Hyperlipidemia    Hypertension    Malignant neoplasm of left breast ([place])    Stroke (cerebrum) ([facility])    Tibial plateau fracture, left    04-13-17 had ORIF  Past Surgical History:  Procedure Laterality Date   MASTECTOMY Left 2019   MASTECTOMY MODIFIED RADICAL Left 11/20/2017   Procedure: LEFT MODIFIED RADICAL MASTECTOMY;  Surgeon: Fanny Skates, MD;  Location: [place];  Service: General;  Laterality: Left;   ORIF TIBIA PLATEAU Left 04/13/2017   Procedure: OPEN REDUCTION INTERNAL FIXATION (ORIF) TIBIAL PLATEAU;  Surgeon: Shona Needles, MD;  Location: [place];  Service: Orthopedics;  Laterality: Left;   PORT-A-CATH REMOVAL Right 11/20/2017   Procedure: REMOVAL PORT-A-CATH;  Surgeon: Fanny Skates, MD;  Location: [place];  Service: General;  Laterality:  Right;   PORTACATH PLACEMENT Right 05/31/2017   Procedure: INSERTION PORT-A-CATH;  Surgeon: Fanny Skates, MD;  Location: [place];  Service: General;  Laterality: Right;     Outpatient Encounter Medications as of 04/18/2022  Medication Sig Note   Accu-Chek Softclix Lancets lancets Use to check blood sugar 3 times daily. E11.65    amLODipine (NORVASC) 5 MG tablet Take 1 tablet (5 mg total) by mouth daily. For blood pressure    atorvastatin (LIPITOR) 40 MG tablet Take 1 tablet (40 mg total) by mouth daily. For cholesterol    Blood Glucose Monitoring Suppl (ACCU-CHEK GUIDE) w/Device KIT use kit to check blood glucose three times daily    Blood Pressure Monitor DEVI Please provide patient with insurance approved blood pressure monitor    fenofibrate (TRICOR) 145 MG tablet Take 1 tablet (145 mg total) by mouth daily. For cholesterol    glucose blood (ACCU-CHEK GUIDE) test strip Use to check blood sugar 3 times daily. E11.65    hydrOXYzine (ATARAX) 10 MG tablet Take 1 tablet (10 mg total) by mouth 3 (three) times daily as needed for anxiety. 01/11/2022: Patient is taking at bedtime.    insulin glargine (LANTUS) 100 UNIT/ML Solostar Pen Inject 20 Units into the skin 2 (two) times daily.    insulin lispro (HUMALOG KWIKPEN) 100 UNIT/ML KwikPen Inject 10 Units into the skin 3 (three) times daily.    Insulin Pen Needle 31G X 5 MM MISC Inject 1 Device into the skin QID. For use with insulin pens    Insulin Syringe-Needle U-100 30G X 5/16" 1 ML MISC Use as directed to inject into the skin 3 times daily.    levofloxacin (LEVAQUIN) 500 MG tablet Take 1 tablet (500 mg total) by mouth daily.    losartan (COZAAR) 50 MG tablet Take 1 tablet (50 mg total) by mouth daily.    ondansetron (ZOFRAN) 8 MG tablet Take 1 tablet (8 mg total) by mouth every 8 (eight) hours as needed for nausea or vomiting.    pantoprazole (PROTONIX) 40 MG tablet Take 1 tablet (40 mg total) by mouth daily at 6 (six) AM. For  heartburn    potassium chloride SA (KLOR-CON M) 20 MEQ tablet Take 1 tablet (20 mEq total) by mouth daily.    citalopram (CELEXA) 20 MG tablet Take 1 tablet (20 mg total) by mouth daily. For depression (Patient not taking: Reported on 04/12/2022)    Continuous Blood Gluc Receiver (FREESTYLE LIBRE READER) DEVI Monitor blood glucose levels 5-6 times per day E11.65 (Patient not taking: Reported on 01/11/2022)    Continuous Blood Gluc Sensor (FREESTYLE LIBRE 2 SENSOR) MISC Monitor blood glucose levels 5-6 times per day E11.65 (Patient not taking: Reported on 01/11/2022)    fluticasone (FLONASE) 50 MCG/ACT nasal spray Place 2 sprays into both nostrils daily. (Patient not taking: Reported on 04/12/2022)    prochlorperazine (COMPAZINE) 10 MG tablet Take 1  tablet (10 mg total) by mouth every 6 (six) hours as needed for nausea or vomiting. (Patient not taking: Reported on 03/17/2022)    Facility-Administered Encounter Medications as of 04/18/2022  Medication   heparin lock flush 100 unit/mL   sodium chloride flush (NS) 0.9 % injection 10 mL     Today's Vitals   04/18/22 0827  BP: 125/63  Pulse: 73  Resp: 15  Temp: 98 F (36.7 C)  TempSrc: Temporal  SpO2: 100%  Weight: 119 lb 1.6 oz (54 kg)  Height: 5' (1.524 m)   Body mass index is 23.26 kg/m.   PHYSICAL EXAM GENERAL:alert, no distress and comfortable SKIN: no rash  HEENT: Sclera clear.  No thrush or ulcers.   LUNGS: clear with normal breathing effort HEART: regular rate & rhythm ABDOMEN: abdomen soft, non-tender and normal bowel sounds NEURO: alert & oriented x 3 with fluent speech, no focal motor/sensory deficits   CBC    Latest Ref Rng & Units 04/18/2022    7:47 AM 04/11/2022    8:05 AM 04/04/2022    8:08 AM  CBC  WBC 4.0 - 10.5 K/uL 2.4  2.4  2.9   Hemoglobin 12.0 - 15.0 g/dL 9.1  7.8  8.5   Hematocrit 36.0 - 46.0 % 26.0  23.1  24.9   Platelets 150 - 400 K/uL 43  52  80        Latest Ref Rng & Units 04/18/2022    7:47 AM  04/11/2022    8:05 AM 04/04/2022    8:08 AM  CMP  Glucose 70 - 99 mg/dL 244  205  189   BUN 8 - 23 mg/dL 22  16  17    Creatinine 0.44 - 1.00 mg/dL 0.69  0.59  0.67   Sodium 135 - 145 mmol/L 137  138  138   Potassium 3.5 - 5.1 mmol/L 3.9  3.6  3.9   Chloride 98 - 111 mmol/L 104  104  104   CO2 22 - 32 mmol/L 27  28  29    Calcium 8.9 - 10.3 mg/dL 9.3  9.4  9.3   Total Protein 6.5 - 8.1 g/dL  8.2  8.0   Total Bilirubin 0.3 - 1.2 mg/dL  0.6  0.6   Alkaline Phos 38 - 126 U/L  211  116   AST 15 - 41 U/L  13  14   ALT 0 - 44 U/L  8  8       ASSESSMENT & PLAN:Amanda Duarte is a 67 y.o. female with    1. 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 She also has moderate to severe neutropenia -CT CAP showed no evidence for metastatic disease. -bone marrow biopsy on 10/25/21 showed, within limited material: hypercellular bone marrow with features of myeloid neoplasm. Flow cytometry showed 5% myeloblastic cells, no monoclonal B-cell, nonspecific T cell changes. Repeat biopsy was recommended. -She underwent repeat bone marrow biopsy on 10/11, I discussed results with pathologist Dr. Donneta Romberg and consulted with heme specialist Dr. Lorenso Courier; I reviewed the results with the pt and her family today which again shows high grade myeloid neoplasm/MDS with 7% blasts. No evidence of acute leukemia but she understands the high risk of transformation -She was evaluated by [facility] at [facility], she is open to this. We are reserving for progression -She began Azacitadine injection, first  cycle was 7 days. She was admitted for severe pancytopenia.  -C2 was reduced to 4 days, C3 and subsequent cycles x5 days which she is tolerating -Mrs. Derden appears stable.  S/p cycle 4, tolerating well without significant side effects except neuropathy, which is managed with B vitamin.  Performance  status remains adequate for treatment. -Labs reviewed, she remains transfusion dependent. She is pancytopenic today. We reviewed precautions.  -Proceed with C5 Vidaza daily x5, starting today as planned. Will continue weekly lab and transfusion as needed -F/up in 4 weeks with next cycle   2. 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 She had excellent response. -most recent right mammogram on 07/26/21 and right axilla [name] on 10/27/21 were benign.   3. Pancytopenia -secondary to her MDS -We will give blood transfusion if hemoglobin less than 8, and platelet transfusion if platelet less than 20 K -No G-CSF due to the excessive blasts in the marrow -Her counts improved somewhat after cycle 3, but have dropped again at start of cycle 5 -We will give leukoreduced and irradiated blood products given the possibility she may undergo bone marrow transplant in the future.      PLAN: -Labs reviewed -Proceed with C5 Vidaza daily x5, starting today as planned -No RBC this week, continue neutropenic/thrombocytopenic precautions -Continue daily prophylactic levaquin -Weekly lab and transfusion as needed -F/up in 4 weeks with cycle 6    All questions were answered. The patient knows to call the clinic with any problems, questions or concerns. No barriers to learning were detected.   [name], NP-C 04/18/2022

## 2022-04-14 NOTE — Patient Outreach (Signed)
  Care Coordination   Follow Up Visit Note   04/14/2022 Name: Amanda Duarte MRN: [id number] DOB: Oct 21, 1955  Amanda Duarte is a 67 y.o. year old female who sees Default, Provider, MD (Inactive) for primary care. I visited  Amanda Duarte in their home today.  What matters to the patients health and wellness today?  Continue LIVING. Make sure my grandson will be looked after upon my death.    Goals Addressed             This Visit's Progress    Patient will report she is getting adequate food to support herself and her grandson in the next month.       Interventions Today    Flowsheet Row Most Recent Value  Chronic Disease   Chronic disease during today's visit Diabetes, Other  [MDS (Cancer with ongoing chemo tx)]  General Interventions   General Interventions Discussed/Reviewed General Interventions Reviewed, General Interventions Discussed  [Received food resource list but she is unable to go and pick up the groceries. New Critical LCSW Spanish speaker needed to address getting support for her  challenged grandson and to make plans for him when she is at the end of her life. Complex]  Mental Health Interventions   Mental Health Discussed/Reviewed Mental Health Discussed, Mental Health Reviewed, Coping Strategies, Refer to Social Work for resources  Refer to Social Work for resources regarding Other  [Additional services for free food delivery and legal services.]  Nutrition Interventions   Nutrition Discussed/Reviewed Carbohydrate meal planning, Adding fruits and vegetables, Portion sizes  Pharmacy Interventions   Pharmacy Dicussed/Reviewed --  [Pt requested that all meds be transferred to the [facility]. NP made calls to arrange.]  Safety Interventions   Safety Discussed/Reviewed Safety Reviewed, Home Safety           Patient will verbalize she is getting the emotional support she needs over the next 3 months.                 SDOH assessments and  interventions completed:  Yes    Previously addressed and interventions are ongoing.  Care Coordination Interventions:  Yes, provided   Follow up plan: Follow up call scheduled for 2 weeks.    Encounter Outcome:  Pt. Visit Completed   [name] C. Myrtie Neither, MSN, [facility] Gerontological Nurse Practitioner [facility] Care Management [phone]

## 2022-04-15 ENCOUNTER — Telehealth: Payer: Self-pay | Admitting: *Deleted

## 2022-04-15 NOTE — Progress Notes (Signed)
  Care Coordination   Note   04/15/2022 Name: Amanda Duarte MRN: [id number] DOB: 09-Jun-1955  Amanda Duarte is a 67 y.o. year old female who sees Default, Provider, MD (Inactive) for primary care. I reached out to [employer] by phone today to offer care coordination services.  Spanish interpreter left pt VM of appointment - will call back with interpreter to try to confirm with pt   Care Coordination Consent Status:   Follow up plan:  Telephone appointment with care coordination team member scheduled for:  04/18/2022  Encounter Outcome:  Pt. Scheduled  Julian Hy, [place] Direct Dial: [phone]

## 2022-04-18 ENCOUNTER — Telehealth: Payer: Self-pay

## 2022-04-18 ENCOUNTER — Inpatient Hospital Stay: Payer: Medicare Other

## 2022-04-18 ENCOUNTER — Inpatient Hospital Stay (HOSPITAL_BASED_OUTPATIENT_CLINIC_OR_DEPARTMENT_OTHER): Payer: Medicare Other | Admitting: Nurse Practitioner

## 2022-04-18 ENCOUNTER — Ambulatory Visit: Payer: Self-pay | Admitting: Licensed Clinical Social Worker

## 2022-04-18 ENCOUNTER — Other Ambulatory Visit: Payer: Self-pay

## 2022-04-18 ENCOUNTER — Encounter: Payer: Self-pay | Admitting: Nurse Practitioner

## 2022-04-18 VITALS — BP 122/60 | HR 72 | Temp 98.2°F | Resp 18

## 2022-04-18 VITALS — BP 125/63 | HR 73 | Temp 98.0°F | Resp 15 | Ht 60.0 in | Wt 119.1 lb

## 2022-04-18 DIAGNOSIS — Z5111 Encounter for antineoplastic chemotherapy: Secondary | ICD-10-CM | POA: Diagnosis not present

## 2022-04-18 DIAGNOSIS — Z170 Estrogen receptor positive status [ER+]: Secondary | ICD-10-CM

## 2022-04-18 DIAGNOSIS — C50212 Malignant neoplasm of upper-inner quadrant of left female breast: Secondary | ICD-10-CM

## 2022-04-18 DIAGNOSIS — D46Z Other myelodysplastic syndromes: Secondary | ICD-10-CM

## 2022-04-18 DIAGNOSIS — D649 Anemia, unspecified: Secondary | ICD-10-CM

## 2022-04-18 LAB — CBC WITH DIFFERENTIAL (CANCER CENTER ONLY)
Abs Immature Granulocytes: 0.02 10*3/uL (ref 0.00–0.07)
Basophils Absolute: 0.1 10*3/uL (ref 0.0–0.1)
Basophils Relative: 2 %
Eosinophils Absolute: 0.6 10*3/uL — ABNORMAL HIGH (ref 0.0–0.5)
Eosinophils Relative: 22 %
HCT: 26 % — ABNORMAL LOW (ref 36.0–46.0)
Hemoglobin: 9.1 g/dL — ABNORMAL LOW (ref 12.0–15.0)
Immature Granulocytes: 1 %
Lymphocytes Relative: 57 %
Lymphs Abs: 1.6 10*3/uL (ref 0.7–4.0)
MCH: 30.7 pg (ref 26.0–34.0)
MCHC: 35 g/dL (ref 30.0–36.0)
MCV: 87.8 fL (ref 80.0–100.0)
Monocytes Absolute: 0.2 10*3/uL (ref 0.1–1.0)
Monocytes Relative: 6 %
Neutro Abs: 0.3 10*3/uL — CL (ref 1.7–7.7)
Neutrophils Relative %: 12 %
Platelet Count: 43 10*3/uL — ABNORMAL LOW (ref 150–400)
RBC: 2.96 MIL/uL — ABNORMAL LOW (ref 3.87–5.11)
RDW: 15.6 % — ABNORMAL HIGH (ref 11.5–15.5)
WBC Count: 2.4 10*3/uL — ABNORMAL LOW (ref 4.0–10.5)
nRBC: 0 % (ref 0.0–0.2)

## 2022-04-18 LAB — BASIC METABOLIC PANEL - CANCER CENTER ONLY
Anion gap: 6 (ref 5–15)
BUN: 22 mg/dL (ref 8–23)
CO2: 27 mmol/L (ref 22–32)
Calcium: 9.3 mg/dL (ref 8.9–10.3)
Chloride: 104 mmol/L (ref 98–111)
Creatinine: 0.69 mg/dL (ref 0.44–1.00)
GFR, Estimated: >60 mL/min (ref >60)
Glucose, Bld: 244 mg/dL — ABNORMAL HIGH (ref 70–99)
Potassium: 3.9 mmol/L (ref 3.5–5.1)
Sodium: 137 mmol/L (ref 135–145)

## 2022-04-18 LAB — SAMPLE TO BLOOD BANK

## 2022-04-18 MED ORDER — AZACITIDINE CHEMO SQ INJECTION
112.5000 mg | Freq: Once | INTRAMUSCULAR | Status: AC
  Administered 2022-04-18: 112.5 mg via SUBCUTANEOUS
  Filled 2022-04-18: qty 4.5

## 2022-04-18 MED ORDER — ONDANSETRON HCL 8 MG PO TABS
8.0000 mg | ORAL_TABLET | Freq: Once | ORAL | Status: AC
  Administered 2022-04-18: 8 mg via ORAL
  Filled 2022-04-18: qty 1

## 2022-04-18 NOTE — Patient Outreach (Signed)
  Care Coordination  Initial Visit Note   04/18/2022 Name: Amanda Duarte MRN: [id number] DOB: [birth year]  Amanda Duarte is a 67 y.o. year old female who sees Default, Provider, MD (Inactive) for primary care. I spoke with  [name] by phone today.  Interpreter:Yes.   ; Name: Amanda Duarte [id number] and Language: Spanish   What matters to the patients health and wellness today?  Concerned about her grandson wants to see if he can apply for disability  Patient lives with her grandson, she receives food stamps however reports it is not enough with increase in food prices.  Referral made to [place] also provider phone number to legal aid Collaborate with [place] Worker to give patient food during office visit tomorrow Provided patient with community options to pick up food, ( brother will take her when he is available)   Goals Addressed             This Visit's Progress    Additional Community Resources and support       Activities and task to complete in order to accomplish goals.   Follow up on food resources discussed  [employer], [address] (in the building behind the main building) In Person Shopping: Wednesday 12pm-7pm, Friday 11am-2pm, and Saturday 9am-12pm  [place]  [place], [place] [address] [phone] / [phone]   [email or web address]     Per your request I placed a referral today for someone from the [place] to call you [address] [place],  [address]    [phone]   [email or web address]         SDOH assessments and interventions completed:  Yes  SDOH Interventions Today    Flowsheet Row Most Recent Value  SDOH Interventions   Food Insecurity Interventions Other (Comment)  [provided community food resources]      Care Coordination Interventions:  Yes, provided  Interventions Today    Flowsheet Row Most Recent Value  Chronic  Disease   Chronic disease during today's visit Hypertension (HTN), Diabetes  General Interventions   General Interventions Discussed/Reviewed General Interventions Reviewed, [employer], Communication with  [related to food access]  Communication with --  [facility] [phone],  Referral placed on line. recieved confirmation that referral has been received and they will contact patient.,  collborated with [place] Education officer, museum to provide addtional support during office visit tomorrow]  Education Interventions   Education Provided Provided Education  Provided Verbal Education On [employer] [phone],  food resources and  disability]       Follow up plan: No further intervention required.   Encounter Outcome:  Pt. Visit Completed   Casimer Lanius, Max [phone]

## 2022-04-18 NOTE — Progress Notes (Signed)
Ok to treat today with Plt count of 43 and ANC of 0.3 Per provider.

## 2022-04-18 NOTE — Patient Instructions (Signed)
Visit Information  Thank you for taking time to visit with me today. Please don't hesitate to contact me if I can be of assistance to you.   Following are the goals we discussed today:   Goals Addressed             This Visit's Progress    Additional Community Resources and support       Activities and task to complete in order to accomplish goals.   Follow up on food resources discussed  [employer], [address] (in the building behind the main building) In Person Shopping: Wednesday 12pm-7pm, Friday 11am-2pm, and Saturday 9am-12pm  [place]  [place], [place] [address] [phone] / [phone]   [email or web address]     Per your request I placed a referral today for someone from the [place] to call you [place], [place], [place] [address]    [phone]   [email or web address]          Please call the care guide team at [phone] if you need to cancel or reschedule your appointment.    The patient verbalized understanding of instructions, educational materials, and care plan provided today and DECLINED offer to receive copy of patient instructions, educational materials, and care plan.   No further follow up required: [place], [place] [phone]

## 2022-04-18 NOTE — Telephone Encounter (Signed)
CRITICAL VALUE STICKER  CRITICAL VALUE:ANC-0.3  RECEIVER (on-site recipient of call):Audry Riles DATE & TIME NOTIFIED: 04/18/2022 9:20 AM  MESSENGER (representative from lab): Lauren in lab  MD NOTIFIED: [name] NP  TIME OF NOTIFICATION: 9.22 AM  RESPONSE: Information given to the provider who acknowledge the information.

## 2022-04-19 ENCOUNTER — Other Ambulatory Visit: Payer: Self-pay

## 2022-04-19 ENCOUNTER — Inpatient Hospital Stay: Payer: Medicare Other | Admitting: Licensed Clinical Social Worker

## 2022-04-19 ENCOUNTER — Inpatient Hospital Stay: Payer: Medicare Other

## 2022-04-19 VITALS — BP 115/52 | HR 98 | Temp 98.0°F | Resp 18

## 2022-04-19 DIAGNOSIS — D46Z Other myelodysplastic syndromes: Secondary | ICD-10-CM

## 2022-04-19 DIAGNOSIS — Z5111 Encounter for antineoplastic chemotherapy: Secondary | ICD-10-CM | POA: Diagnosis not present

## 2022-04-19 DIAGNOSIS — Z170 Estrogen receptor positive status [ER+]: Secondary | ICD-10-CM

## 2022-04-19 MED ORDER — ONDANSETRON HCL 8 MG PO TABS
8.0000 mg | ORAL_TABLET | Freq: Once | ORAL | Status: AC
  Administered 2022-04-19: 8 mg via ORAL
  Filled 2022-04-19: qty 1

## 2022-04-19 MED ORDER — AZACITIDINE CHEMO SQ INJECTION
75.0000 mg/m2 | Freq: Once | INTRAMUSCULAR | Status: AC
  Administered 2022-04-19: 113.25 mg via SUBCUTANEOUS
  Filled 2022-04-19: qty 4.53

## 2022-04-19 NOTE — Progress Notes (Signed)
[place] CSW Progress Note  Clinical Education officer, museum  received a message from the [facility] social worker requesting pt be provided w/ a food bag from the pantry at her next appointment.  CSW contacted pt w/ an interpreter to inform a food bag will be left at reception for her to pick up following her appointment on 3/27.  Pt verbalized understanding and will pick up the bag after her infusion.  CSW to remain available as appropriate to provide support.        Henriette Combs, LCSW    Patient is participating in a Managed Medicaid Plan:  Yes

## 2022-04-19 NOTE — Patient Instructions (Signed)
Instrucciones al darle de alta: Discharge Instructions Gracias por elegir al [facility] [place] para brindarle atencin mdica de oncologa y hematologa.   Si usted tiene una cita de laboratorio con el Centro de C[facility]cer, por favor vaya directamente al Centro [facility] y regstrese en el rea de registro.   Use ropa cmoda y adecuada para tener fcil acceso a las vas del Portacath (acceso venoso de larga duracin) o la lnea PICC (catter central colocado por va perifrica).   Nos esforzamos por ofrecerle tiempo de calidad con su proveedor. Es posible que tenga que volver a programar su cita si llega tarde (15 minutos o ms).  El llegar tarde le afecta a usted y a otros pacientes cuyas citas son posteriores a la suya.  Adems, si usted falta a tres o ms citas sin avisar a la oficina, puede ser retirado(a) de la clnica a discrecin del proveedor.      Para las solicitudes de renovacin de recetas, pida a su farmacia que se ponga en contacto con nuestra oficina y deje que transcurran 72 horas para que se complete el proceso de las renovaciones.    Hoy usted recibi los siguientes agentes de quimioterapia e/o inmunoterapia: Vidaza        Para ayudar a prevenir las nuseas y los vmitos despus de su tratamiento, le recomendamos que tome su medicamento para las nuseas segn las indicaciones.  LOS SNTOMAS QUE DEBEN COMUNICARSE INMEDIATAMENTE SE INDICAN A CONTINUACIN: *FIEBRE SUPERIOR A 100.4 F (38 C) O MS *ESCALOFROS O SUDORACIN *NUSEAS Y VMITOS QUE NO SE CONTROLAN CON EL MEDICAMENTO PARA LAS NUSEAS *DIFICULTAD INUSUAL PARA RESPIRAR  *MORETONES O HEMORRAGIAS NO HABITUALES *PROBLEMAS URINARIOS (dolor o ardor al orinar o frecuencia para orinar) *PROBLEMAS INTESTINALES (diarrea inusual, estreimiento, dolor cerca del ano) SENSIBILIDAD EN LA BOCA Y EN LA GARGANTA CON O SIN LA PRESENCIA DE LCERAS (dolor de garganta, llagas en la boca o dolor de muelas/dientes) ERUPCIN,  HINCHAZN O DOLORES INUSUALES FLUJO VAGINAL INUSUAL O PICAZN/RASQUIA    Los puntos marcados con un asterisco ( *) indican una posible emergencia y debe hacer un seguimiento tan pronto como le sea posible o vaya al Departamento de Emergencias si se le presenta algn problema.  Por favor, muestre la TARJETA DE ADVERTENCIA DE QUIMIOTERAPIA O LA TARJETA DE ADVERTENCIA DE INMUNOTERAPIA al registrarse en el Departamento de Emergencias y a la enfermera de triaje.  Si tiene preguntas despus de su visita o necesita cancelar o volver a programar su cita, por favor pngase en contacto con [facility]  Dept: [phone] y siga las instrucciones. Las horas de oficina son de 8:00 a.m. a 4:30 p.m. de lunes a viernes. Por favor, tenga en cuenta que los mensajes de voz que se dejan despus de las 4:00 p.m. posiblemente no se devolvern hasta el siguiente da de trabajo.  Cerramos los fines de semana y los das festivos importantes. En todo momento tiene acceso a una enfermera para preguntas urgentes. Por favor, llame al nmero principal de la clnica Dept: [phone] y siga las instrucciones.   Para cualquier pregunta que no sea de carcter urgente, tambin puede ponerse en contacto con su proveedor utilizando MyChart. Ahora ofrecemos visitas electrnicas para cualquier persona mayor de 18 aos que solicite atencin mdica en lnea para los sntomas que no sean urgentes. Para ms detalles vaya a [email or web address].   Tambin puede bajar la aplicacin de MyChart! Vaya a la tienda de aplicaciones, busque "MyChart",   abra la aplicacin, seleccione [place], e ingrese con su nombre de usuario y la contrasea de MyChart. 

## 2022-04-20 ENCOUNTER — Inpatient Hospital Stay: Payer: Medicare Other

## 2022-04-20 VITALS — BP 129/40 | HR 72 | Temp 98.6°F | Resp 18

## 2022-04-20 DIAGNOSIS — Z170 Estrogen receptor positive status [ER+]: Secondary | ICD-10-CM

## 2022-04-20 DIAGNOSIS — Z5111 Encounter for antineoplastic chemotherapy: Secondary | ICD-10-CM | POA: Diagnosis not present

## 2022-04-20 MED ORDER — AZACITIDINE CHEMO SQ INJECTION
75.0000 mg/m2 | Freq: Once | INTRAMUSCULAR | Status: AC
  Administered 2022-04-20: 113.25 mg via SUBCUTANEOUS
  Filled 2022-04-20: qty 4.53

## 2022-04-20 MED ORDER — ONDANSETRON HCL 8 MG PO TABS
8.0000 mg | ORAL_TABLET | Freq: Once | ORAL | Status: AC
  Administered 2022-04-20: 8 mg via ORAL
  Filled 2022-04-20: qty 1

## 2022-04-20 NOTE — Patient Instructions (Signed)
Instrucciones al darle de alta: Discharge Instructions Gracias por elegir al [facility] [place] para brindarle atencin mdica de oncologa y hematologa.   Si usted tiene una cita de laboratorio con el Cent[facility], por favor vaya directamente al [facility] y regstrese en el rea de registro.   Use ropa cmoda y adecuada para tener fcil acceso a las vas del Portacath (acceso venoso de larga duracin) o la lnea PICC (catter central colocado por va perifrica).   Nos esforzamos por ofrecerle tiempo de calidad con su proveedor. Es posible que tenga que volver a programar su cita si llega tarde (15 minutos o ms).  El llegar tarde le afecta a usted y a otros pacientes cuyas citas son posteriores a la suya.  Adems, si usted falta a tres o ms citas sin avisar a la oficina, puede ser retirado(a) de la clnica a discrecin del proveedor.      Para las solicitudes de renovacin de recetas, pida a su farmacia que se ponga en contacto con nuestra oficina y deje que transcurran 72 horas para que se complete el proceso de las renovaciones.    Hoy usted recibi los siguientes agentes de quimioterapia e/o inmunoterapia: Vidaza        Para ayudar a prevenir las nuseas y los vmitos despus de su tratamiento, le recomendamos que tome su medicamento para las nuseas segn las indicaciones.  LOS SNTOMAS QUE DEBEN COMUNICARSE INMEDIATAMENTE SE INDICAN A CONTINUACIN: *FIEBRE SUPERIOR A 100.4 F (38 C) O MS *ESCALOFROS O SUDORACIN *NUSEAS Y VMITOS QUE NO SE CONTROLAN CON EL MEDICAMENTO PARA LAS NUSEAS *DIFICULTAD INUSUAL PARA RESPIRAR  *MORETONES O HEMORRAGIAS NO HABITUALES *PROBLEMAS URINARIOS (dolor o ardor al orinar o frecuencia para orinar) *PROBLEMAS INTESTINALES (diarrea inusual, estreimiento, dolor cerca del ano) SENSIBILIDAD EN LA BOCA Y EN LA GARGANTA CON O SIN LA PRESENCIA DE LCERAS (dolor de garganta, llagas en la boca o dolor de muelas/dientes) ERUPCIN,  HINCHAZN O DOLORES INUSUALES FLUJO VAGINAL INUSUAL O PICAZN/RASQUIA    Los puntos marcados con un asterisco ( *) indican una posible emergencia y debe hacer un seguimiento tan pronto como le sea posible o vaya al Departamento de Emergencias si se le presenta algn problema.  Por favor, muestre la TARJETA DE ADVERTENCIA DE QUIMIOTERAPIA O LA TARJETA DE ADVERTENCIA DE INMUNOTERAPIA al registrarse en el Departamento de Emergencias y a la enfermera de triaje.  Si tiene preguntas despus de su visita o necesita cancelar o volver a programar su cita, por favor pngase en contacto con [facility]  Dept: [phone] y siga las instrucciones. Las horas de oficina son de 8:00 a.m. a 4:30 p.m. de lunes a viernes. Por favor, tenga en cuenta que los mensajes de voz que se dejan despus de las 4:00 p.m. posiblemente no se devolvern hasta el siguiente da de trabajo.  Cerramos los fines de semana y los das festivos importantes. En todo momento tiene acceso a una enfermera para preguntas urgentes. Por favor, llame al nmero principal de la clnica Dept: [phone] y siga las instrucciones.   Para cualquier pregunta que no sea de carcter urgente, tambin puede ponerse en contacto con su proveedor utilizando MyChart. Ahora ofrecemos visitas electrnicas para cualquier persona mayor de 18 aos que solicite atencin mdica en lnea para los sntomas que no sean urgentes. Para ms detalles vaya a [email or web address].   Tambin puede bajar la aplicacin de MyChart! Vaya a la tienda de aplicaciones, busque "MyChart",   abra la aplicacin, seleccione [place], e ingrese con su nombre de usuario y la contrasea de MyChart. 

## 2022-04-21 ENCOUNTER — Other Ambulatory Visit: Payer: Self-pay

## 2022-04-21 ENCOUNTER — Inpatient Hospital Stay: Payer: Medicare Other

## 2022-04-21 VITALS — BP 128/37 | HR 73 | Temp 98.9°F | Resp 18

## 2022-04-21 DIAGNOSIS — Z5111 Encounter for antineoplastic chemotherapy: Secondary | ICD-10-CM | POA: Diagnosis not present

## 2022-04-21 DIAGNOSIS — C50212 Malignant neoplasm of upper-inner quadrant of left female breast: Secondary | ICD-10-CM

## 2022-04-21 MED ORDER — AZACITIDINE CHEMO SQ INJECTION
75.0000 mg/m2 | Freq: Once | INTRAMUSCULAR | Status: AC
  Administered 2022-04-21: 113.25 mg via SUBCUTANEOUS
  Filled 2022-04-21: qty 4.53

## 2022-04-21 MED ORDER — ONDANSETRON HCL 8 MG PO TABS
8.0000 mg | ORAL_TABLET | Freq: Once | ORAL | Status: AC
  Administered 2022-04-21: 8 mg via ORAL
  Filled 2022-04-21: qty 1

## 2022-04-21 NOTE — Patient Instructions (Signed)
Instrucciones al darle de alta: Discharge Instructions Gracias por elegir al [facility] de [facility] de [place] para brindarle atencin mdica de oncologa y Music therapist.   Si usted tiene una cita de laboratorio con [place], por favor vaya directamente [place] y regstrese en el rea de Control and instrumentation engineer.   Use ropa cmoda y [place] para tener fcil acceso a las vas del Portacath (acceso venoso de Engineer, site duracin) o la lnea PICC (catter central colocado por va perifrica).   Nos esforzamos por ofrecerle tiempo de calidad con su proveedor. Es posible que tenga que volver a programar su cita si llega tarde (15 minutos o ms).  El llegar tarde le afecta a usted y a otros pacientes cuyas citas son posteriores a [employer]ch[employer], retail.  Adems, si usted falta a tres o ms citas sin avisar a la oficina, puede ser retirado(a) de la clnica a discrecin del proveedor.      Para las solicitudes de renovacin de recetas, pida a su farmacia que se ponga en contacto con nuestra oficina y deje que transcurran 51 horas para que se complete el proceso de las renovaciones.    Hoy usted recibi los siguientes agentes de quimioterapia e/o inmunoterapia [employer]      Para ayudar a prevenir las nuseas y los vmitos despus de su tratamiento, le recomendamos que tome su medicamento para las nuseas segn las indicaciones.  LOS SNTOMAS QUE DEBEN COMUNICARSE INMEDIATAMENTE SE INDICAN A CONTINUACIN: *FIEBRE SUPERIOR A 100.4 F (38 C) O MS *ESCALOFROS O SUDORACIN *NUSEAS Y VMITOS QUE NO SE CONTROLAN CON EL MEDICAMENTO PARA LAS NUSEAS *DIFICULTAD INUSUAL PARA RESPIRAR  *MORETONES O HEMORRAGIAS NO HABITUALES *PROBLEMAS URINARIOS (dolor o ardor al Garment/textile technologist o frecuencia para Garment/textile technologist) *PROBLEMAS INTESTINALES (diarrea inusual, estreimiento, dolor cerca del ano) SENSIBILIDAD EN LA BOCA Y EN LA GARGANTA CON O SIN LA PRESENCIA DE LCERAS (dolor de garganta, llagas en la boca o dolor de muelas/dientes) ERUPCIN,  HINCHAZN O DOLORES INUSUALES FLUJO VAGINAL INUSUAL O PICAZN/RASQUIA    Los puntos marcados con un asterisco ( *) indican una posible emergencia y debe hacer un seguimiento Kristiane Morsch pronto como le sea posible o vaya al Departamento de Emergencias si se le presenta algn problema.  Por favor, muestre la [place] DE ADVERTENCIA DE Windy Canny DE ADVERTENCIA DE Benay Spice al registrarse en [address] de Emergencias y a la enfermera de triaje.  Si tiene preguntas despus de su visita o necesita cancelar o volver a programar su cita, por favor pngase en contacto con [place]  Dept: [phone] y [place] instrucciones. Las horas de oficina son de 8:00 a.m. a 4:30 p.m. de lunes a viernes. Por favor, tenga en cuenta que los mensajes de voz que se dejan despus de las 4:00 p.m. posiblemente no se devolvern hasta el siguiente da de [place].  Cerramos los fines de semana y [employer]. En todo momento tiene acceso a una enfermera para preguntas urgentes. Por favor, llame al nmero principal de la clnica Dept: [phone] y [place] instrucciones.   Para cualquier pregunta que no sea de carcter urgente, tambin puede ponerse en contacto con su proveedor [employer]. Ahora ofrecemos visitas electrnicas para cualquier persona mayor de 18 aos que solicite atencin mdica en lnea para los sntomas que no sean urgentes. Para ms detalles vaya a [email or web address].   Tambin puede bajar la aplicacin de MyChart! Vaya a la tienda de aplicaciones, busque "MyChart", abra la  aplicacin, seleccione [place], e ingrese con su nombre de usuario y la contrasea de Pharmacist, community.

## 2022-04-22 ENCOUNTER — Inpatient Hospital Stay: Payer: Medicare Other

## 2022-04-22 VITALS — BP 124/39 | HR 80 | Temp 98.4°F | Resp 18

## 2022-04-22 DIAGNOSIS — Z5111 Encounter for antineoplastic chemotherapy: Secondary | ICD-10-CM | POA: Diagnosis not present

## 2022-04-22 DIAGNOSIS — C50212 Malignant neoplasm of upper-inner quadrant of left female breast: Secondary | ICD-10-CM

## 2022-04-22 MED ORDER — AZACITIDINE CHEMO SQ INJECTION
75.0000 mg/m2 | Freq: Once | INTRAMUSCULAR | Status: AC
  Administered 2022-04-22: 113.25 mg via SUBCUTANEOUS
  Filled 2022-04-22: qty 4.53

## 2022-04-22 MED ORDER — ONDANSETRON HCL 8 MG PO TABS
8.0000 mg | ORAL_TABLET | Freq: Once | ORAL | Status: AC
  Administered 2022-04-22: 8 mg via ORAL
  Filled 2022-04-22: qty 1

## 2022-04-22 NOTE — Patient Instructions (Signed)
Instrucciones al darle de alta: Discharge Instructions Gracias por elegir al [facility] [place] para brindarle atencin mdica de oncologa y hematologa.   Si usted tiene una cita de laboratorio con el Centro de Cncer, por favor vaya directamente al Centro de Cncer y regstrese en el rea de registro.   Use ropa cmoda y adecuada para tener fcil acceso a las vas del Portacath (acceso venoso de larga duracin) o la lnea PICC (catter central colocado por va perifrica).   Nos esforzamos por ofrecerle tiempo de calidad con su proveedor. Es posible que tenga que volver a programar su cita si llega tarde (15 minutos o ms).  El llegar tarde le afecta a usted y a otros pacientes cuyas citas son posteriores a la suya.  Adems, si usted falta a tres o ms citas sin avisar a la oficina, puede ser retirado(a) de la clnica a discrecin del proveedor.      Para las solicitudes de renovacin de recetas, pida a su farmacia que se ponga en contacto con nuestra oficina y deje que transcurran 72 horas para que se complete el proceso de las renovaciones.    Hoy usted recibi los siguientes agentes de quimioterapia e/o inmunoterapia: Vidaza        Para ayudar a prevenir las nuseas y los vmitos despus de su tratamiento, le recomendamos que tome su medicamento para las nuseas segn las indicaciones.  LOS SNTOMAS QUE DEBEN COMUNICARSE INMEDIATAMENTE SE INDICAN A CONTINUACIN: *FIEBRE SUPERIOR A 100.4 F (38 C) O MS *ESCALOFROS O SUDORACIN *NUSEAS Y VMITOS QUE NO SE CONTROLAN CON EL MEDICAMENTO PARA LAS NUSEAS *DIFICULTAD INUSUAL PARA RESPIRAR  *MORETONES O HEMORRAGIAS NO HABITUALES *PROBLEMAS URINARIOS (dolor o ardor al orinar o frecuencia para orinar) *PROBLEMAS INTESTINALES (diarrea inusual, estreimiento, dolor cerca del ano) SENSIBILIDAD EN LA BOCA Y EN LA GARGANTA CON O SIN LA PRESENCIA DE LCERAS (dolor de garganta, llagas en la boca o dolor de muelas/dientes) ERUPCIN,  HINCHAZN O DOLORES INUSUALES FLUJO VAGINAL INUSUAL O PICAZN/RASQUIA    Los puntos marcados con un asterisco ( *) indican una posible emergencia y debe hacer un seguimiento tan pronto como le sea posible o vaya al Departamento de Emergencias si se le presenta algn problema.  Por favor, muestre la TARJETA DE ADVERTENCIA DE QUIMIOTERAPIA O LA TARJETA DE ADVERTENCIA DE INMUNOTERAPIA al registrarse en el Departamento de Emergencias y a la enfermera de triaje.  Si tiene preguntas despus de su visita o necesita cancelar o volver a programar su cita, por favor pngase en contacto con [facility]  Dept: [phone] y siga las instrucciones. Las horas de oficina son de 8:00 a.m. a 4:30 p.m. de lunes a viernes. Por favor, tenga en cuenta que los mensajes de voz que se dejan despus de las 4:00 p.m. posiblemente no se devolvern hasta el siguiente da de trabajo.  Cerramos los fines de semana y los das festivos importantes. En todo momento tiene acceso a una enfermera para preguntas urgentes. Por favor, llame al nmero principal de la clnica Dept: [phone] y siga las instrucciones.   Para cualquier pregunta que no sea de carcter urgente, tambin puede ponerse en contacto con su proveedor utilizando MyChart. Ahora ofrecemos visitas electrnicas para cualquier persona mayor de 18 aos que solicite atencin mdica en lnea para los sntomas que no sean urgentes. Para ms detalles vaya a [email or web address].   Tambin puede bajar la aplicacin de MyChart! Vaya a la tienda de aplicaciones, busque "MyChart",   abra la aplicacin, seleccione [place], e ingrese con su nombre de usuario y la contrasea de MyChart. 

## 2022-04-25 ENCOUNTER — Telehealth: Payer: Self-pay

## 2022-04-25 ENCOUNTER — Inpatient Hospital Stay: Payer: Medicare Other | Attending: Hematology

## 2022-04-25 ENCOUNTER — Other Ambulatory Visit: Payer: Self-pay

## 2022-04-25 DIAGNOSIS — D469 Myelodysplastic syndrome, unspecified: Secondary | ICD-10-CM | POA: Insufficient documentation

## 2022-04-25 DIAGNOSIS — Z170 Estrogen receptor positive status [ER+]: Secondary | ICD-10-CM

## 2022-04-25 DIAGNOSIS — D46Z Other myelodysplastic syndromes: Secondary | ICD-10-CM

## 2022-04-25 DIAGNOSIS — Z79899 Other long term (current) drug therapy: Secondary | ICD-10-CM | POA: Insufficient documentation

## 2022-04-25 DIAGNOSIS — D649 Anemia, unspecified: Secondary | ICD-10-CM

## 2022-04-25 LAB — CMP (CANCER CENTER ONLY)
ALT: 9 U/L (ref 0–44)
AST: 14 U/L — ABNORMAL LOW (ref 15–41)
Albumin: 3.9 g/dL (ref 3.5–5.0)
Alkaline Phosphatase: 242 U/L — ABNORMAL HIGH (ref 38–126)
Anion gap: 6 (ref 5–15)
BUN: 22 mg/dL (ref 8–23)
CO2: 25 mmol/L (ref 22–32)
Calcium: 9.5 mg/dL (ref 8.9–10.3)
Chloride: 103 mmol/L (ref 98–111)
Creatinine: 0.63 mg/dL (ref 0.44–1.00)
GFR, Estimated: >60 mL/min (ref >60)
Glucose, Bld: 377 mg/dL — ABNORMAL HIGH (ref 70–99)
Potassium: 3.9 mmol/L (ref 3.5–5.1)
Sodium: 134 mmol/L — ABNORMAL LOW (ref 135–145)
Total Bilirubin: 0.5 mg/dL (ref 0.3–1.2)
Total Protein: 8.1 g/dL (ref 6.5–8.1)

## 2022-04-25 LAB — CBC WITH DIFFERENTIAL (CANCER CENTER ONLY)
Abs Immature Granulocytes: 0.01 10*3/uL (ref 0.00–0.07)
Basophils Absolute: 0 10*3/uL (ref 0.0–0.1)
Basophils Relative: 1 %
Eosinophils Absolute: 1.3 10*3/uL — ABNORMAL HIGH (ref 0.0–0.5)
Eosinophils Relative: 39 %
HCT: 23.8 % — ABNORMAL LOW (ref 36.0–46.0)
Hemoglobin: 8.3 g/dL — ABNORMAL LOW (ref 12.0–15.0)
Immature Granulocytes: 0 %
Lymphocytes Relative: 44 %
Lymphs Abs: 1.4 10*3/uL (ref 0.7–4.0)
MCH: 31.4 pg (ref 26.0–34.0)
MCHC: 34.9 g/dL (ref 30.0–36.0)
MCV: 90.2 fL (ref 80.0–100.0)
Monocytes Absolute: 0.1 10*3/uL (ref 0.1–1.0)
Monocytes Relative: 3 %
Neutro Abs: 0.4 10*3/uL — CL (ref 1.7–7.7)
Neutrophils Relative %: 13 %
Platelet Count: 81 10*3/uL — ABNORMAL LOW (ref 150–400)
RBC: 2.64 MIL/uL — ABNORMAL LOW (ref 3.87–5.11)
RDW: 16.6 % — ABNORMAL HIGH (ref 11.5–15.5)
WBC Count: 3.2 10*3/uL — ABNORMAL LOW (ref 4.0–10.5)
nRBC: 0 % (ref 0.0–0.2)

## 2022-04-25 LAB — SAMPLE TO BLOOD BANK

## 2022-04-25 NOTE — Telephone Encounter (Signed)
Critical lab value of ANC 0.4 today.  Notified [name], NP of critical lab value.

## 2022-04-26 ENCOUNTER — Other Ambulatory Visit: Payer: Self-pay

## 2022-04-26 ENCOUNTER — Ambulatory Visit: Payer: Self-pay | Admitting: *Deleted

## 2022-04-26 NOTE — Patient Outreach (Signed)
Care Coordination   Follow Up Visit Note   04/26/2022 Name: Amanda Duarte MRN: [id number] DOB: 12-16-1955  Amanda Duarte is a 67 y.o. year old female who sees Default, Provider, MD for primary care. I spoke with  Amanda Duarte by phone today.  What matters to the patients health and wellness today?  Getting assistance for my grandson to support him when I am not here.    Goals Addressed               This Visit's Progress     Patient Stated     To keep blood sugars stable while taking Prednisone (pt-stated)        04/26/22 Pt is not taking prednisone, however, her chemotherapy DOES cause hyperglycemia. Pt is on an insulin regimen which she does follow.  Care Coordination Interventions: Provided education to patient about basic DM disease process Reviewed medications with patient and discussed importance of medication adherence Provided patient with written educational materials related to hypo and hyperglycemia and importance of correct treatment Advised patient, providing education and rationale, to check cbg before meals and at bedtime and record, calling PCP for findings outside established parameters Review of patient status, including review of consultants reports, relevant laboratory and other test results, and medications completed Determined patient is experiencing hyperglycemic events later in the day, <300 Discussed patient's Rx for prednisone and determined patient is taking an extended loading dose as prescribed by Oncology Determined patient has an Rx for [employer] and is working with pharmacy to obtain this device Placed outbound call to PCP, spoke with nurse Amanda Duarte, reported hyperglycemic episodes and asked if PCP would like to consider adding a sliding scale regimen Determined nurse Amanda Duarte will discuss with Amanda Patten NP and advise of elevated CBGs while taking Prednisone  Mailed printed educational materials related to Hypo/Hyperglycemic management           Other     Additional Community Resources and support        [facility] NP talked with pt today 04/26/22 and she has not received a call from the [place] that she knows of. NP forwarded the message below to pt's Oncology NP to request their LCSW to assist further with obtaining these services.  Activities and task to complete in order to accomplish goals.   Follow up on food resources discussed  [employer], [address] (in the building behind the main building) In Person Shopping: Wednesday 12pm-7pm, Friday 11am-2pm, and Saturday 9am-12pm  [place]  [place], [place] [address] [phone] / [phone]   [email or web address]     Per your request I placed a referral today for someone from the [place] to call you [address] [place], [place] [address]    [phone]   [email or web address]        Patient will report she is getting adequate food to support herself and her grandson in the next month.        Interventions Today    Flowsheet Row Most Recent Value  Chronic Disease   Chronic disease during today's visit Other  [Food Need]  General Interventions   General Interventions Discussed/Reviewed General Interventions Reviewed  [Pt has received the list of resources for additional food. Has received a bag of groceries from the [facility]. Pt grandson has a car and can take them to the food pantry. Pt also have food stamps. They are not without food.]  SDOH assessments and interventions completed:  Yes    In progress to provide food and Social Services.  Care Coordination Interventions:  Yes, provided   Follow up plan:  Amanda Duarte, Amanda Duarte to schedule appt the first week in May.    Encounter Outcome:  Pt. Visit Completed   Amanda Duarte. Amanda Neither, MSN, [facility] [place] Gerontological Nurse Practitioner [facility] Care Management [phone]

## 2022-04-27 ENCOUNTER — Ambulatory Visit (HOSPITAL_BASED_OUTPATIENT_CLINIC_OR_DEPARTMENT_OTHER): Payer: Medicare Other | Admitting: Pulmonary Disease

## 2022-04-28 ENCOUNTER — Other Ambulatory Visit: Payer: Self-pay

## 2022-05-02 ENCOUNTER — Other Ambulatory Visit: Payer: Self-pay

## 2022-05-02 ENCOUNTER — Telehealth: Payer: Self-pay

## 2022-05-02 ENCOUNTER — Inpatient Hospital Stay: Payer: Medicare Other

## 2022-05-02 DIAGNOSIS — C50212 Malignant neoplasm of upper-inner quadrant of left female breast: Secondary | ICD-10-CM

## 2022-05-02 DIAGNOSIS — D469 Myelodysplastic syndrome, unspecified: Secondary | ICD-10-CM | POA: Diagnosis not present

## 2022-05-02 DIAGNOSIS — D46Z Other myelodysplastic syndromes: Secondary | ICD-10-CM

## 2022-05-02 DIAGNOSIS — D649 Anemia, unspecified: Secondary | ICD-10-CM

## 2022-05-02 LAB — CMP (CANCER CENTER ONLY)
ALT: 11 U/L (ref 0–44)
AST: 15 U/L (ref 15–41)
Albumin: 3.9 g/dL (ref 3.5–5.0)
Alkaline Phosphatase: 205 U/L — ABNORMAL HIGH (ref 38–126)
Anion gap: 7 (ref 5–15)
BUN: 16 mg/dL (ref 8–23)
CO2: 26 mmol/L (ref 22–32)
Calcium: 9.5 mg/dL (ref 8.9–10.3)
Chloride: 103 mmol/L (ref 98–111)
Creatinine: 0.64 mg/dL (ref 0.44–1.00)
GFR, Estimated: >60 mL/min (ref >60)
Glucose, Bld: 289 mg/dL — ABNORMAL HIGH (ref 70–99)
Potassium: 3.8 mmol/L (ref 3.5–5.1)
Sodium: 136 mmol/L (ref 135–145)
Total Bilirubin: 0.6 mg/dL (ref 0.3–1.2)
Total Protein: 8 g/dL (ref 6.5–8.1)

## 2022-05-02 LAB — CBC WITH DIFFERENTIAL (CANCER CENTER ONLY)
Abs Immature Granulocytes: 0.01 10*3/uL (ref 0.00–0.07)
Basophils Absolute: 0 10*3/uL (ref 0.0–0.1)
Basophils Relative: 1 %
Eosinophils Absolute: 0.4 10*3/uL (ref 0.0–0.5)
Eosinophils Relative: 19 %
HCT: 20.7 % — ABNORMAL LOW (ref 36.0–46.0)
Hemoglobin: 7.2 g/dL — ABNORMAL LOW (ref 12.0–15.0)
Immature Granulocytes: 1 %
Lymphocytes Relative: 62 %
Lymphs Abs: 1.3 10*3/uL (ref 0.7–4.0)
MCH: 31.3 pg (ref 26.0–34.0)
MCHC: 34.8 g/dL (ref 30.0–36.0)
MCV: 90 fL (ref 80.0–100.0)
Monocytes Absolute: 0.1 10*3/uL (ref 0.1–1.0)
Monocytes Relative: 3 %
Neutro Abs: 0.3 10*3/uL — CL (ref 1.7–7.7)
Neutrophils Relative %: 14 %
Platelet Count: 51 10*3/uL — ABNORMAL LOW (ref 150–400)
RBC: 2.3 MIL/uL — ABNORMAL LOW (ref 3.87–5.11)
RDW: 16.9 % — ABNORMAL HIGH (ref 11.5–15.5)
WBC Count: 2.1 10*3/uL — ABNORMAL LOW (ref 4.0–10.5)
nRBC: 0 % (ref 0.0–0.2)

## 2022-05-02 LAB — PREPARE RBC (CROSSMATCH)

## 2022-05-02 LAB — SAMPLE TO BLOOD BANK

## 2022-05-02 NOTE — Telephone Encounter (Signed)
Critical Lab Values reported:  ANC 0.2 & Hbg 7.2  Dr. Mosetta Putt verbally and sent via message notification/s.

## 2022-05-02 NOTE — Telephone Encounter (Signed)
Late Entry:  Spoke with pt and son via telephone to confirm appt on 05/03/2022 @1400  for blood transfusion.  Pt and son confirmed appt.

## 2022-05-03 ENCOUNTER — Other Ambulatory Visit: Payer: Self-pay

## 2022-05-03 ENCOUNTER — Other Ambulatory Visit: Payer: Self-pay | Admitting: *Deleted

## 2022-05-03 ENCOUNTER — Inpatient Hospital Stay: Payer: Medicare Other

## 2022-05-03 DIAGNOSIS — D46Z Other myelodysplastic syndromes: Secondary | ICD-10-CM

## 2022-05-03 DIAGNOSIS — D649 Anemia, unspecified: Secondary | ICD-10-CM

## 2022-05-03 DIAGNOSIS — Z170 Estrogen receptor positive status [ER+]: Secondary | ICD-10-CM

## 2022-05-03 DIAGNOSIS — D469 Myelodysplastic syndrome, unspecified: Secondary | ICD-10-CM | POA: Diagnosis not present

## 2022-05-03 LAB — BPAM RBC

## 2022-05-03 LAB — TYPE AND SCREEN: Unit division: 0

## 2022-05-03 LAB — SAMPLE TO BLOOD BANK

## 2022-05-03 MED ORDER — SODIUM CHLORIDE 0.9% IV SOLUTION: 250.0000 mL | Freq: Once | INTRAVENOUS | Status: DC

## 2022-05-03 MED ORDER — DIPHENHYDRAMINE HCL 25 MG PO CAPS
25.0000 mg | ORAL_CAPSULE | Freq: Once | ORAL | Status: AC
  Administered 2022-05-03: 25 mg via ORAL
  Filled 2022-05-03: qty 1

## 2022-05-03 MED ORDER — ACETAMINOPHEN 325 MG PO TABS
650.0000 mg | ORAL_TABLET | Freq: Once | ORAL | Status: AC
  Administered 2022-05-03: 650 mg via ORAL
  Filled 2022-05-03: qty 2

## 2022-05-03 NOTE — Progress Notes (Signed)
Pt removed Blood Bank Bracelet.  Spoke with [place] in Blood Bank and [place] stated that once the T&S is drawn and sent to Blood Bank they can associate the blood to the previous blood orders.

## 2022-05-03 NOTE — Progress Notes (Signed)
Pt reported to infusion today for blood transfusion. Pt's blue blood bracelet was not present upon her arrival to infusion. [place] RN made Dr. Mosetta Putt and team aware. Add on lab appt was scheduled to obtain a new T&S prior to d/c today. Phlebotomist drew new T&S and sample to blood bank labs in infusion. Both this RN and Gaffer separately educated Pt to keep new blue blood bracelet on her arm until after her transfusion tomorrow. Pt verbalized understanding and was agreeable to keep bracelet on. This RN informed Pt of new infusion appt on 05/04/22 at 0930 to received the blood transfusion. Pt was agreeable to new appt and verbalized understanding. This RN provided Pt with a copy of updated appts in Spanish. Pt again verbalized understanding of keeping bracelet on and of her appt at 0930 on 05/04/22. Pt was ambulatory in stable condition with no complaints at the time of d/c from infusion.

## 2022-05-04 ENCOUNTER — Other Ambulatory Visit: Payer: Self-pay | Admitting: Nurse Practitioner

## 2022-05-04 ENCOUNTER — Inpatient Hospital Stay: Payer: Medicare Other

## 2022-05-04 ENCOUNTER — Other Ambulatory Visit: Payer: Self-pay

## 2022-05-04 DIAGNOSIS — Z794 Long term (current) use of insulin: Secondary | ICD-10-CM

## 2022-05-04 DIAGNOSIS — Z170 Estrogen receptor positive status [ER+]: Secondary | ICD-10-CM

## 2022-05-04 DIAGNOSIS — D649 Anemia, unspecified: Secondary | ICD-10-CM

## 2022-05-04 DIAGNOSIS — D46Z Other myelodysplastic syndromes: Secondary | ICD-10-CM

## 2022-05-04 DIAGNOSIS — D469 Myelodysplastic syndrome, unspecified: Secondary | ICD-10-CM | POA: Diagnosis not present

## 2022-05-04 LAB — TYPE AND SCREEN
ABO/RH(D): O POS
Antibody Screen: NEGATIVE
Unit division: 0

## 2022-05-04 LAB — BPAM RBC
Blood Product Expiration Date: 202405062359
Unit Type and Rh: 5100

## 2022-05-04 MED ORDER — ACETAMINOPHEN 325 MG PO TABS
650.0000 mg | ORAL_TABLET | Freq: Once | ORAL | Status: AC
  Administered 2022-05-04: 650 mg via ORAL
  Filled 2022-05-04: qty 2

## 2022-05-04 MED ORDER — DIPHENHYDRAMINE HCL 25 MG PO CAPS
25.0000 mg | ORAL_CAPSULE | Freq: Once | ORAL | Status: AC
  Administered 2022-05-04: 25 mg via ORAL
  Filled 2022-05-04: qty 1

## 2022-05-04 MED ORDER — SODIUM CHLORIDE 0.9 % IV SOLN: INTRAVENOUS | Status: DC

## 2022-05-04 NOTE — Patient Instructions (Signed)
Transfusin de sangre en los adultos, cuidados posteriores Blood Transfusion, Adult, Care After La siguiente informacin ofrece orientacin sobre cmo cuidarse despus del procedimiento. El mdico tambin podr darle instrucciones ms especficas. Comunquese con el mdico si tiene problemas o preguntas. Qu puedo esperar despus del procedimiento? Despus del procedimiento, es normal tener los siguientes sntomas: Dolor y moretones en el lugar donde se coloc la va intravenosa (IV). Dolor de cabeza. Siga estas instrucciones en su casa: Cuidado del lugar de la insercin de la va intravenosa (IV)     Siga las instrucciones del mdico acerca del cuidado del lugar de la insercin de la va intravenosa (IV). Asegrese de hacer lo siguiente: Lvese las manos con agua y jabn durante al menos 20 segundos antes y despus de cambiarse la venda (vendaje). Use desinfectante para manos si no dispone de agua y jabn. Cambie los vendajes como se lo haya indicado el mdico. Controle el lugar de insercin de la va intravenosa (IV) todos los das para descartar signos de infeccin. Est atento a los siguientes signos: Dolor, hinchazn o enrojecimiento. Sangrado proveniente del lugar. Calor. Pus o mal olor. Instrucciones generales Use los medicamentos de venta libre y los recetados solamente como se lo haya indicado el mdico. Haga reposo como se lo haya indicado el mdico. Retome sus actividades normales como se lo haya indicado el mdico. Concurra a todas las visitas de seguimiento. Es posible que sea necesario realizar anlisis de laboratorio en ciertos perodos para volver a controlar sus recuentos sanguneos. Comunquese con un mdico si: Tiene picazn o zonas enrojecidas e hinchadas en la piel (urticaria). Tiene fiebre o escalofros. Tiene dolor de cabeza, en la espalda o el pecho. Se siente ansioso o dbil despus de realizar sus actividades habituales. Tiene enrojecimiento, hinchazn, calor  o dolor alrededor del lugar de la insercin de la va intravenosa. Observa sangre que sale del lugar de la insercin de la va intravenosa que no se detiene al ejercer presin. Tiene pus o percibe mal olor que proviene del lugar de la insercin de la va intravenosa. Si recibi la transfusin de sangre en un entorno para pacientes ambulatorios, le indicarn con quin debe ponerse en contacto para informar cualquier reaccin. Solicite ayuda de inmediato si: Tiene signos de una reaccin alrgica grave o de una reaccin del sistema inmunitario, por ejemplo: Dificultad para respirar o falta de aire. Hinchazn de la cara, sensacin de sofoco o erupcin cutnea generalizada. Orina de color oscuro o sangre en la orina. Latidos cardacos acelerados. Estos sntomas pueden indicar una emergencia. Solicite ayuda de inmediato. Llame al 911. No espere a ver si los sntomas desaparecen. No conduzca por sus propios medios hasta el hospital. Resumen Los moretones y el dolor alrededor del lugar de la insercin de la va intravenosa (IV) son frecuentes. Controle el lugar de insercin de la va intravenosa (IV) todos los das para descartar signos de infeccin. Haga reposo como se lo haya indicado el mdico. Retome sus actividades normales como se lo haya indicado el mdico. Busque ayuda de inmediato si tiene sntomas de una reaccin alrgica grave o de una reaccin del sistema inmunitario a una transfusin de sangre. Esta informacin no tiene como fin reemplazar el consejo del mdico. Asegrese de hacerle al mdico cualquier pregunta que tenga. Document Revised: 05/06/2021 Document Reviewed: 05/06/2021 Elsevier Patient Education  2023 Elsevier Inc.   

## 2022-05-04 NOTE — Progress Notes (Signed)
Clarification- patient to get one unit of blood today not the two unit order released. Blood bank aware

## 2022-05-05 ENCOUNTER — Other Ambulatory Visit: Payer: Self-pay

## 2022-05-05 LAB — BPAM RBC
Blood Product Expiration Date: 202405072359
ISSUE DATE / TIME: 202404100949
Unit Type and Rh: 5100

## 2022-05-05 LAB — TYPE AND SCREEN
ABO/RH(D): O POS
Antibody Screen: NEGATIVE

## 2022-05-06 ENCOUNTER — Other Ambulatory Visit: Payer: Self-pay

## 2022-05-06 NOTE — Progress Notes (Addendum)
[facility]   [phone] [phone]   Clinic Follow up Note   Patient Care Team: Default, Provider, MD as PCP - General Malachy Mood, MD as Consulting Physician (Hematology) Claud Kelp, MD as Consulting Physician (General Surgery) Pollyann Samples, NP as Nurse Practitioner (Nurse Practitioner) Antony Blackbird, MD as Consulting Physician (Radiation Oncology) Earl Lites, MD as Referring Physician (Hematology and Oncology) Riley Churches, RN as [facility] Management Rosaria Ferries, Orpah Clinton, MD as Referring Physician (Hematology and Oncology)  Date of Service:  05/09/2022  CHIEF COMPLAINT: f/u of high grade MDS and h/o breast cancer   CURRENT THERAPY:  Azacitadine injection, days 1-5, started on 11/15/21  (first cycle 7 days); C2 x4 days, subsequent cycles daily x5 days   ASSESSMENT:  Amanda Duarte is a 67 y.o. 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  Her FISH test showed 3 abnormal type, her risk of transforming to acute leukemia is very high, likely in the next 6 to 12 months -She started azacitidine injection on November 15, 2021, she was admitted to hospital after chemo, for severe profound pancytopenia, aspiration pneumonia, required intubation. She has recovered well  -Her blood counts improved after first cycle chemotherapy, likely she responded to treatment. -she started cycle 2 azacitidine on 01/25/2022, will reduce to 4 days for this cycle. If she tolerates well, will change to 5 days from next cyle  -She was evaluated for bone marrow transplant at [facility] and is open to transplant. We recommend continuing chemo for now, and reserve transplant when she progress. She is tolerating Azacitadine well.   -She has  been more cytopenic lately, concerning for disease progression.  I will repeat her bone marrow biopsy in the next 2-3 weeks   Malignant neoplasm of upper-inner quadrant of left breast in female, estrogen receptor negative (HCC) cTxN2aM0, G3, ER, PR and HER-2 negative (triple negative), [id number]  -diagnosed in 04/2017, s/p neoadjuvant ddAC-TC, left mastectomy and adjuvant radiation. She had excellent response. -most recent right mammogram on 07/26/21 and right axilla [name] on 10/27/21 were benign. -she is on surveillance now   Type 2 diabetes mellitus with hyperlipidemia (HCC) -Continue medication and follow-up with PCP     Essential hypertension -Continue medication and follow-up with PCP      PLAN: -lab hg 8.3, plt 17K -Recommend to continue the antibiotic Levaquin due to the neutropenia -Recommend Platelet transfusion today -lab later this week -lab and f/u and next cycle azacitidine next Monday, if her platelet counts recovers. -Repeated bone marrow biopsy in 2 to 3 weeks.  SUMMARY OF ONCOLOGIC HISTORY: Oncology History Overview Note  Cancer Staging Malignant neoplasm of upper-inner quadrant of left breast in female, estrogen receptor positive (HCC) Staging form: Breast, AJCC 8th Edition - Clinical stage from 05/01/2017: Stage Unknown (cTX, cN2, cM0, G3, ER-, PR-, HER2-) - Signed by Malachy Mood, MD on 11/01/2017 - Pathologic stage from 11/20/2017: No Stage Recommended (ypT0, pN43mi, cM0, GX, ER-, PR-, HER2-) - Signed by Malachy Mood, MD on 12/10/2017     Malignant neoplasm of upper-inner quadrant of left breast in female, estrogen receptor negative (HCC)  04/28/2017 Mammogram   IMPRESSION: Two adjacent masses/enlarged lymph nodes in the LOWER LEFT axilla, the largest measuring 2.1 cm. Tissue sampling of 1 of these is recommended to  exclude malignancy/lymphoma. No mammographic evidence of breast malignancy bilaterally.    05/01/2017 Initial Biopsy   Diagnosis 05/01/17 Lymph node,  needle/core biopsy, low left inferior axillary - METASTATIC POORLY DIFFERENTIATED CARCINOMA TO A LYMPH NODE. SEE NOTE.   05/01/2017 Cancer Staging   Staging form: Breast, AJCC 8th Edition - Clinical stage from 05/01/2017: Stage Unknown (cTX, cN1, cM0, G3, ER-, PR-, HER2-) - Signed by Malachy Mood, MD on 06/02/2017    05/01/2017 Receptors her2   Lymph Node Biopsy:  HER2-Negative  PR-Negative  ER- Negative    05/10/2017 Imaging   MR Breast W WO Contrast 05/10/17 IMPRESSION: No MRI evidence of malignancy in the right breast. Area of week stippled non mass enhancement in the left breast upper inner quadrant. Separate area of thin linear non mass enhancement in the subareolar left breast. Three grossly abnormal left axillary lymph nodes, and more than 4 less than 1 cm indeterminate left axillary lymph nodes. No evidence of right axillary lymphadenopathy.   05/17/2017 Initial Biopsy   Diagnosis 05/17/17 1. 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 2. Breast, left, needle core biopsy, upper inner post MR enhancement - MICROSCOPIC FOCUS OF DUCTAL CARCINOMA IN SITU.   05/17/2017 Receptors her2   Left Breast Biopsy:  ER-Negative PR-Negative HER2-Negative   05/22/2017 Initial Diagnosis   Malignant neoplasm of upper-inner quadrant of left breast in female, estrogen receptor positive (HCC)   06/01/2017 Imaging   Boen Scan 06/01/17 IMPRESSION: No definite scintigraphic evidence of osseous metastatic disease.   Posttraumatic and postsurgical uptake at the LEFT knee.   Nonspecific soft tissue distribution of tracer at the LEFT thigh, could represent contusion, hemorrhage, soft tissue edema, or soft tissue calcifications such as from heterotopic calcification and myositis ossificans; recommend clinical correlation and consider dedicated LEFT femoral radiographs.   Single focus of nonspecific increased tracer  localization at the lateral LEFT orbit.     06/01/2017 Imaging   06/01/2017 Bone Scan IMPRESSION: No definite scintigraphic evidence of osseous metastatic disease.   Posttraumatic and postsurgical uptake at the LEFT knee.   Nonspecific soft tissue distribution of tracer at the LEFT thigh, could represent contusion, hemorrhage, soft tissue edema, or soft tissue calcifications such as from heterotopic calcification and myositis ossificans; recommend clinical correlation and consider dedicated LEFT femoral radiographs.   Single focus of nonspecific increased tracer localization at the lateral LEFT orbit.   06/09/2017 -  Chemotherapy   ddAC every 2 weeks for 4 weeks starting 06/09/17-07/21/17 followed by weekly [place] and taxol for 12 weeks 08/04/17-10/20/17.    10/24/2017 Imaging   Breast MRI B/l 10/24/17 IMPRESSION: 1. No residual enhancement in the LEFT breast following neoadjuvant treatment. 2. Significantly smaller LEFT axillary lymph nodes, largest now measuring 1.4 centimeters. The remainder of LEFT axillary lymph nodes demonstrate normal fatty hila. RECOMMENDATION: Treatment plan for known LEFT breast cancer.   11/20/2017 Cancer Staging   Staging form: Breast, AJCC 8th Edition - Pathologic stage from 11/20/2017: No Stage Recommended (ypT0, pN81mi, cM0, GX, ER-, PR-, HER2-) - Signed by Malachy Mood, MD on 12/10/2017   11/20/2017 Surgery   Left mastectomy and SLN biopsy by Dr. Derrell Lolling    11/20/2017 Pathology Results   Breast, modified radical mastectomy , left - MICROSCOPIC FOCI OF RESIDUAL METASTATIC CARCINOMA INVOLVING TWO OF EIGHT LYMPH NODES (2/8); LARGEST CONTINUOUS FOCUS MEASURES 0.1 CM. - NO EVIDENCE OF RESIDUAL CARCINOMA IN THE MASTECTOMY SPECIMEN - MARKED THERAPY-RELATED CHANGES, INCLUDING DENSE  HYALIN FIBROSIS     11/20/2017 Receptors her2   ER- PR- HER2- (IHC 1+)   01/02/2018 - 02/14/2018 Radiation Therapy   Adjuvant Radiation 01/02/18 - 02/14/18   07/31/2018 Imaging    Baseline DEXA 07/31/18  ASSESSMENT: The BMD measured at AP Spine L1-L4 is 0.973 g/cm2 with a T-score of -1.7.   This patient is considered OSTEOPENIC according to World Health Organization [facility]) criteria. The scan quality is good.   Site Region Measured Date Measured Age YA T-score BMD Significant CHANGE   AP Spine  L1-L4      07/31/2018    63.1         -1.7    0.973 g/cm2   DualFemur Neck Left  07/31/2018    63.1         -1.5    0.828 g/cm2   DualFemur Total Mean 07/31/2018    63.1         0.1     1.016 g/cm2   11/05/2018 Survivorship   Per Santiago Glad, NP    11/15/2021 -  Chemotherapy   Patient is on Treatment Plan : MYELODYSPLASIA  Azacitidine SQ D1-7 q28d     02/03/2022 Imaging    IMPRESSION: No active cardiopulmonary disease. No evidence of pneumonia or pulmonary edema. Possible mild chronic interstitial lung disease.      INTERVAL HISTORY:  Amanda Duarte is here for a follow up of high grade MDS and h/o breast cancer. She was last seen by NP Lacie on 04/18/2022. She presents to the clinic accompanied by interpreter.Pt state that she is doing fine. Pt report her appetite is good. Pt state she has finished her antibiotics.    All other systems were reviewed with the patient and are negative.  MEDICAL HISTORY:  Past Medical History:  Diagnosis Date   Cancer 03/2017   left breast cancer   Diabetes mellitus without complication    Hyperlipidemia    Hypertension    Malignant neoplasm of left breast    Stroke (cerebrum)    Tibial plateau fracture, left    04-13-17 had ORIF    SURGICAL HISTORY: Past Surgical History:  Procedure Laterality Date   MASTECTOMY Left 2019   MASTECTOMY MODIFIED RADICAL Left 11/20/2017   Procedure: LEFT MODIFIED RADICAL MASTECTOMY;  Surgeon: Claud Kelp, MD;  Location: [facility] OR;  Service: General;  Laterality: Left;   ORIF TIBIA PLATEAU Left 04/13/2017   Procedure: OPEN REDUCTION INTERNAL FIXATION (ORIF) TIBIAL PLATEAU;  Surgeon:  Roby Lofts, MD;  Location: [facility] OR;  Service: Orthopedics;  Laterality: Left;   PORT-A-CATH REMOVAL Right 11/20/2017   Procedure: REMOVAL PORT-A-CATH;  Surgeon: Claud Kelp, MD;  Location: [facility] OR;  Service: General;  Laterality: Right;   PORTACATH PLACEMENT Right 05/31/2017   Procedure: INSERTION PORT-A-CATH;  Surgeon: Claud Kelp, MD;  Location: [facility];  Service: General;  Laterality: Right;    I have reviewed the social history and family history with the patient and they are unchanged from previous note.  ALLERGIES:  is allergic to victoza [liraglutide].  MEDICATIONS:  Current Outpatient Medications  Medication Sig Dispense Refill   Accu-Chek Softclix Lancets lancets Use to check blood sugar 3 times daily. E11.65 100 each 3   amLODipine (NORVASC) 5 MG tablet Take 1 tablet (5 mg total) by mouth daily. For blood pressure 90 tablet 1   atorvastatin (LIPITOR) 40 MG tablet Take 1 tablet (40 mg total) by mouth daily. For cholesterol 90 tablet 2   Blood  Glucose Monitoring Suppl (ACCU-CHEK GUIDE) w/Device KIT use kit to check blood glucose three times daily 1 kit 0   Blood Pressure Monitor DEVI Please provide patient with insurance approved blood pressure monitor 1 each 0   citalopram (CELEXA) 20 MG tablet Take 1 tablet (20 mg total) by mouth daily. For depression (Patient not taking: Reported on 04/12/2022) 90 tablet 3   Continuous Blood Gluc Receiver (FREESTYLE LIBRE READER) DEVI Monitor blood glucose levels 5-6 times per day E11.65 (Patient not taking: Reported on 01/11/2022) 1 each 0   Continuous Blood Gluc Sensor (FREESTYLE LIBRE 2 SENSOR) MISC Monitor blood glucose levels 5-6 times per day E11.65 (Patient not taking: Reported on 01/11/2022) 3 each 6   fenofibrate (TRICOR) 145 MG tablet Take 1 tablet (145 mg total) by mouth daily. For cholesterol 90 tablet 2   fluticasone (FLONASE) 50 MCG/ACT nasal spray Place 2 sprays into both nostrils daily. (Patient not taking:  Reported on 04/12/2022) 16 g 6   glucose blood (ACCU-CHEK GUIDE) test strip Use to check blood sugar 3 times daily. E11.65 100 each 11   hydrOXYzine (ATARAX) 10 MG tablet Take 1 tablet (10 mg total) by mouth 3 (three) times daily as needed for anxiety. 60 tablet 3   insulin glargine (LANTUS) 100 UNIT/ML Solostar Pen Inject 20 Units into the skin 2 (two) times daily. 36 mL 1   insulin lispro (HUMALOG KWIKPEN) 100 UNIT/ML KwikPen Inject 10 Units into the skin 3 (three) times daily. 15 mL 1   Insulin Pen Needle 31G X 5 MM MISC Inject 1 Device into the skin QID. For use with insulin pens 200 each 6   Insulin Syringe-Needle U-100 30G X 5/16" 1 ML MISC Use as directed to inject into the skin 3 times daily. 200 each 6   levofloxacin (LEVAQUIN) 500 MG tablet Take 1 tablet (500 mg total) by mouth daily. 30 tablet 1   losartan (COZAAR) 50 MG tablet Take 1 tablet (50 mg total) by mouth daily. 30 tablet 0   ondansetron (ZOFRAN) 8 MG tablet Take 1 tablet (8 mg total) by mouth every 8 (eight) hours as needed for nausea or vomiting. 30 tablet 1   pantoprazole (PROTONIX) 40 MG tablet Take 1 tablet (40 mg total) by mouth daily at 6 (six) AM. For heartburn 30 tablet 0   potassium chloride SA (KLOR-CON M) 20 MEQ tablet Take 1 tablet (20 mEq total) by mouth daily. 30 tablet 3   prochlorperazine (COMPAZINE) 10 MG tablet Take 1 tablet (10 mg total) by mouth every 6 (six) hours as needed for nausea or vomiting. (Patient not taking: Reported on 03/17/2022) 30 tablet 1   No current facility-administered medications for this visit.   Facility-Administered Medications Ordered in Other Visits  Medication Dose Route Frequency Provider Last Rate Last Admin   0.9 %  sodium chloride infusion (Manually program via Guardrails IV Fluids)  250 mL Intravenous Once Malachy Mood, MD       heparin lock flush 100 unit/mL  500 Units Intracatheter Once PRN Malachy Mood, MD       sodium chloride flush (NS) 0.9 % injection 10 mL  10 mL  Intracatheter PRN Malachy Mood, MD        PHYSICAL EXAMINATION: ECOG PERFORMANCE STATUS: 0 - Asymptomatic  Vitals:   05/09/22 0938  BP: (!) 126/44  Pulse: 74  Resp: 18  Temp: 98.2 F (36.8 C)  SpO2: 100%   Wt Readings from Last 3 Encounters:  05/09/22 121 lb 9.6  oz (55.2 kg)  04/18/22 119 lb 1.6 oz (54 kg)  03/22/22 120 lb (54.4 kg)     GENERAL:alert, no distress and comfortable SKIN: skin color normal, no rashes or significant lesions EYES: normal, Conjunctiva are pink and non-injected, sclera clear  NEURO: alert & oriented x 3 with fluent speech  LABORATORY DATA:  I have reviewed the data as listed    Latest Ref Rng & Units 05/09/2022    9:19 AM 05/02/2022    7:52 AM 04/25/2022    8:04 AM  CBC  WBC 4.0 - 10.5 K/uL 1.5  2.1  3.2   Hemoglobin 12.0 - 15.0 g/dL 8.3  7.2  8.3   Hematocrit 36.0 - 46.0 % 23.3  20.7  23.8   Platelets 150 - 400 K/uL 17  51  81         Latest Ref Rng & Units 05/09/2022    9:19 AM 05/02/2022    7:52 AM 04/25/2022    8:04 AM  CMP  Glucose 70 - 99 mg/dL 161  096  045   BUN 8 - 23 mg/dL 18  16  22    Creatinine 0.44 - 1.00 mg/dL 4.09  8.11  9.14   Sodium 135 - 145 mmol/L 136  136  134   Potassium 3.5 - 5.1 mmol/L 3.8  3.8  3.9   Chloride 98 - 111 mmol/L 105  103  103   CO2 22 - 32 mmol/L 26  26  25    Calcium 8.9 - 10.3 mg/dL 9.3  9.5  9.5   Total Protein 6.5 - 8.1 g/dL 7.8  8.0  8.1   Total Bilirubin 0.3 - 1.2 mg/dL 0.6  0.6  0.5   Alkaline Phos 38 - 126 U/L 207  205  242   AST 15 - 41 U/L 14  15  14    ALT 0 - 44 U/L 9  11  9        RADIOGRAPHIC STUDIES: I have personally reviewed the radiological images as listed and agreed with the findings in the report. No results found.    No orders of the defined types were placed in this encounter.  All questions were answered. The patient knows to call the clinic with any problems, questions or concerns. No barriers to learning was detected. The total time spent in the appointment was 25  minutes.     Malachy Mood, MD 05/09/2022   Carolin Coy, CMA, am acting as scribe for Malachy Mood, MD.   I have reviewed the above documentation for accuracy and completeness, and I agree with the above.

## 2022-05-09 ENCOUNTER — Other Ambulatory Visit: Payer: Self-pay

## 2022-05-09 ENCOUNTER — Inpatient Hospital Stay (HOSPITAL_BASED_OUTPATIENT_CLINIC_OR_DEPARTMENT_OTHER): Payer: Medicare Other | Admitting: Hematology

## 2022-05-09 ENCOUNTER — Other Ambulatory Visit: Payer: Self-pay | Admitting: *Deleted

## 2022-05-09 ENCOUNTER — Inpatient Hospital Stay: Payer: Medicare Other

## 2022-05-09 ENCOUNTER — Encounter: Payer: Self-pay | Admitting: Hematology

## 2022-05-09 VITALS — BP 126/44 | HR 74 | Temp 98.2°F | Resp 18 | Wt 121.6 lb

## 2022-05-09 DIAGNOSIS — I10 Essential (primary) hypertension: Secondary | ICD-10-CM | POA: Diagnosis not present

## 2022-05-09 DIAGNOSIS — E1169 Type 2 diabetes mellitus with other specified complication: Secondary | ICD-10-CM

## 2022-05-09 DIAGNOSIS — D649 Anemia, unspecified: Secondary | ICD-10-CM

## 2022-05-09 DIAGNOSIS — D46Z Other myelodysplastic syndromes: Secondary | ICD-10-CM

## 2022-05-09 DIAGNOSIS — C50212 Malignant neoplasm of upper-inner quadrant of left female breast: Secondary | ICD-10-CM | POA: Diagnosis not present

## 2022-05-09 DIAGNOSIS — D469 Myelodysplastic syndrome, unspecified: Secondary | ICD-10-CM | POA: Diagnosis not present

## 2022-05-09 DIAGNOSIS — Z170 Estrogen receptor positive status [ER+]: Secondary | ICD-10-CM

## 2022-05-09 DIAGNOSIS — E785 Hyperlipidemia, unspecified: Secondary | ICD-10-CM

## 2022-05-09 LAB — CBC WITH DIFFERENTIAL (CANCER CENTER ONLY)
Abs Immature Granulocytes: 0 10*3/uL (ref 0.00–0.07)
Basophils Absolute: 0 10*3/uL (ref 0.0–0.1)
Basophils Relative: 1 %
Eosinophils Absolute: 0.1 10*3/uL (ref 0.0–0.5)
Eosinophils Relative: 8 %
HCT: 23.3 % — ABNORMAL LOW (ref 36.0–46.0)
Hemoglobin: 8.3 g/dL — ABNORMAL LOW (ref 12.0–15.0)
Immature Granulocytes: 0 %
Lymphocytes Relative: 69 %
Lymphs Abs: 1.1 10*3/uL (ref 0.7–4.0)
MCH: 31.4 pg (ref 26.0–34.0)
MCHC: 35.6 g/dL (ref 30.0–36.0)
MCV: 88.3 fL (ref 80.0–100.0)
Monocytes Absolute: 0.1 10*3/uL (ref 0.1–1.0)
Monocytes Relative: 7 %
Neutro Abs: 0.2 10*3/uL — CL (ref 1.7–7.7)
Neutrophils Relative %: 15 %
Platelet Count: 17 10*3/uL — ABNORMAL LOW (ref 150–400)
RBC: 2.64 MIL/uL — ABNORMAL LOW (ref 3.87–5.11)
RDW: 16.4 % — ABNORMAL HIGH (ref 11.5–15.5)
WBC Count: 1.5 10*3/uL — ABNORMAL LOW (ref 4.0–10.5)
nRBC: 0 % (ref 0.0–0.2)

## 2022-05-09 LAB — BPAM PLATELET PHERESIS: Unit Type and Rh: 9500

## 2022-05-09 LAB — CMP (CANCER CENTER ONLY)
ALT: 9 U/L (ref 0–44)
AST: 14 U/L — ABNORMAL LOW (ref 15–41)
Albumin: 3.7 g/dL (ref 3.5–5.0)
Alkaline Phosphatase: 207 U/L — ABNORMAL HIGH (ref 38–126)
Anion gap: 5 (ref 5–15)
BUN: 18 mg/dL (ref 8–23)
CO2: 26 mmol/L (ref 22–32)
Calcium: 9.3 mg/dL (ref 8.9–10.3)
Chloride: 105 mmol/L (ref 98–111)
Creatinine: 0.57 mg/dL (ref 0.44–1.00)
GFR, Estimated: >60 mL/min (ref >60)
Glucose, Bld: 280 mg/dL — ABNORMAL HIGH (ref 70–99)
Potassium: 3.8 mmol/L (ref 3.5–5.1)
Sodium: 136 mmol/L (ref 135–145)
Total Bilirubin: 0.6 mg/dL (ref 0.3–1.2)
Total Protein: 7.8 g/dL (ref 6.5–8.1)

## 2022-05-09 LAB — SAMPLE TO BLOOD BANK

## 2022-05-09 MED ORDER — SODIUM CHLORIDE 0.9% IV SOLUTION
250.0000 mL | Freq: Once | INTRAVENOUS | Status: AC
  Administered 2022-05-09: 250 mL via INTRAVENOUS

## 2022-05-09 MED ORDER — LEVOFLOXACIN 500 MG PO TABS: 500.0000 mg | ORAL_TABLET | Freq: Every day | ORAL | 1 refills | Status: DC

## 2022-05-09 NOTE — Assessment & Plan Note (Signed)
-  Continue medication and follow-up with PCP 

## 2022-05-09 NOTE — Assessment & Plan Note (Signed)
-  cTxN2aM0, G3, ER, PR and HER-2 negative (triple negative), [id number]  -diagnosed in 04/2017, s/p neoadjuvant ddAC-TC, left mastectomy and adjuvant radiation. She had excellent response. -most recent right mammogram on 07/26/21 and right axilla US on 10/27/21 were benign. -she is on surveillance now  

## 2022-05-09 NOTE — Patient Instructions (Signed)
Transfusin de plaquetas Platelet Transfusion La transfusin de plaquetas es un procedimiento en el cual una persona recibe plaquetas de un donante a travs de una va intravenosa. Las plaquetas son fragmentos de la sangre que se unen y forman un cogulo para ayudar a que el cuerpo deje de Geophysicist/field seismologist despus de una lesin. Si tiene [employer], es posible que su sangre tenga problemas para Biochemist, clinical. Esto puede causar sangrado y moretones con mucha facilidad. Tal vez deba recibir una transfusin de plaquetas si tiene una enfermedad que reduce el nmero de plaquetas (trombocitopenia). Se puede usar una transfusin de plaquetas para detener o evitar el sangrado excesivo. Informe al mdico acerca de lo siguiente: Las reacciones que haya tenido durante transfusiones previas. Cualquier alergia que tenga. Todos los [employer] [place], incluidos vitaminas, hierbas, gotas oftlmicas, cremas y [address] de [address]. Cualquier problema de la sangre que tenga. Cirugas a las que se haya sometido. Cualquier afeccin mdica que tenga. Si est embarazada o podra estarlo. Cules son los riesgos? En general, se trata de un procedimiento seguro. No obstante, pueden ocurrir complicaciones, por ejemplo: Grant Ruts. Infeccin. Reaccin alrgica a las plaquetas donadas (de un donante). Que el sistema de su cuerpo que combate las enfermedades (sistema inmunitario) ataque las plaquetas del donante (reaccin hemoltica). Esto es poco frecuente. Una reaccin poco frecuente que causa dao pulmonar (lesin pulmonar aguda producida por transfusin). Qu ocurre antes del procedimiento? Medicamentos Consulte al mdico si debe hacer o no lo siguiente: Multimedia programmer o suspender los medicamentos que [place] habitualmente. Esto es muy importante si toma medicamentos para la diabetes o anticoagulantes. Tomar medicamentos como aspirina e ibuprofeno. Estos medicamentos pueden tener un efecto anticoagulante en la [place]. No tome  estos medicamentos a menos que el mdico se lo indique. Usar medicamentos de venta libre, vitaminas, hierbas y suplementos. Indicaciones generales Se le realizar un anlisis de sangre para determinar su grupo sanguneo. Su tipo de sangre determina qu tipo de plaquetas recibir. Siga las instrucciones del mdico respecto de las restricciones en las comidas o las bebidas. Si tuvo una reaccin alrgica a una transfusin en el pasado, tal vez le administren un medicamento que ayude a evitarla. Le controlarn la temperatura, la presin arterial, el pulso y la respiracin. Qu ocurre durante el procedimiento?  Le colocarn una va intravenosa (i.v.) en una vena. Por su seguridad, dos mdicos verificarn su identidad y las plaquetas del donante que se le van a infundir. Una bolsa con las plaquetas del donante se conectar a su va intravenosa. Las plaquetas ingresarn en su torrente sanguneo. Esto generalmente demora entre 30 y 60 minutos. Durante la transfusin, le controlarn la temperatura, la presin arterial, el pulso y la respiracin. Esto ayuda a detectar signos tempranos de una reaccin. Tambin lo controlarn para Engineer, manufacturing otros sntomas que puedan indicar una reaccin, como escalofros, urticaria o picazn. Si tiene signos de Astronomer, se suspender la transfusin y tal vez le administren un medicamento para Animator. Una vez finalizada la transfusin, se retirar la va intravenosa. Se puede aplicar presin en el lugar de la va intravenosa (i.v.) para detener cualquier sangrado. El lugar de la va intravenosa (i.v.) se cubrir con un vendaje (venda). El procedimiento puede variar segn el mdico y el hospital. Qu puedo esperar despus del procedimiento? Le controlarn la presin arterial, la temperatura, el pulso y la respiracin hasta que abandone el hospital o la clnica. Es posible que tenga algunos moretones y Pension scheme manager de la  va  intravenosa (i.v.). Siga estas indicaciones en su casa: Medicamentos Use los medicamentos de venta libre y los recetados solamente como se lo haya indicado el mdico. Hable con el mdico antes de tomar cualquier medicamento que contenga aspirina o antiinflamatorios no esteroideos (AINE), como el ibuprofeno. Estos medicamentos aumentan el riesgo de tener una hemorragia peligrosa. Cuidado del lugar de la va intravenosa (i.v.) Controle el lugar de la va intravenosa (i.v.) [employer] para descartar signos de infeccin. Est atento a los siguientes signos: Enrojecimiento, Optician, dispensing. Lquido o sangre. Si sale lquido o sangre del Environmental consultant de la va intravenosa (i.v.), ejerza presin firme con las manos sobre el vendaje que cubre la zona durante uno o dos minutos. Al hacerlo, debera detenerse el sangrado. Calor. Pus o mal olor. Indicaciones generales [place] o retire Secondary school teacher se lo haya indicado el mdico. Retome sus actividades normales como se lo haya indicado el mdico. Pregntele al mdico qu actividades son seguras para usted. No tome baos de inmersin, no nade ni use el jacuzzi hasta que el mdico lo autorice. Pregntele al mdico si puede ducharse. Concurra a todas las visitas de seguimiento. Esto es importante. Comunquese con un mdico si: Siente un dolor de cabeza que no se alivia con medicamentos. Tiene ronchas, una erupcin cutnea o picazn en la piel. Tiene nuseas o vmitos. Se siente cansado o dbil de un modo que no es habitual. Tiene signos de infeccin en el lugar de la va intravenosa (i.v.). Solicite ayuda de inmediato si: Tiene fiebre o escalofros. Orina con menos frecuencia que lo habitual. La orina es de un color ms oscuro que lo normal. Tiene alguno de los siguientes sntomas: Dificultad para Industrial/product designer. Dolor en la espalda, el abdomen o el pecho. Piel fra y hmeda. Latidos cardacos acelerados. Resumen Las plaquetas son fragmentos diminutos de  clulas sanguneas que se agrupan para formar un cogulo de sangre cuando se produce una herida. Si tiene [employer], es posible que su sangre tenga problemas para Biochemist, clinical. La transfusin de plaquetas es un procedimiento en el cual se reciben plaquetas de un donante a travs de una va intravenosa. Se puede usar una transfusin de plaquetas para detener o evitar el sangrado excesivo. Despus del procedimiento, controle el lugar de la va intravenosa (i.v.) todos los das para descartar signos de infeccin. Esta informacin no tiene Theme park manager el consejo del mdico. Asegrese de hacerle al mdico cualquier pregunta que tenga. Document Revised: 08/12/2020 Document Reviewed: 08/12/2020 Elsevier Patient Education  2023 [employer].

## 2022-05-09 NOTE — Assessment & Plan Note (Signed)
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  Her FISH test showed 3 abnormal type, her risk of transforming to acute leukemia is very high, likely in the next 6 to 12 months -She started azacitidine injection on November 15, 2021, she was admitted to hospital after chemo, for severe profound pancytopenia, aspiration pneumonia, required intubation. She has recovered well  -Her blood counts improved after first cycle chemotherapy, likely she responded to treatment. -she started cycle 2 azacitidine on 01/25/2022, will reduce to 4 days for this cycle. If she tolerates well, will change to 5 days from next cyle  -She was evaluated for bone marrow transplant at [facility] and is open to transplant. We recommend continuing chemo for now, and reserve transplant when she progress. She is tolerating Azacitadine well.   

## 2022-05-09 NOTE — Progress Notes (Signed)
Verbal order with read back given by Dr. Mosetta Putt for CT Bone Marrow Biopsy & Aspiration to be done w/in 1 to 2 wks d/t Symptomatic Anemia.

## 2022-05-10 ENCOUNTER — Telehealth: Payer: Self-pay | Admitting: Hematology

## 2022-05-10 LAB — BPAM PLATELET PHERESIS
Blood Product Expiration Date: 202404182359
ISSUE DATE / TIME: 202404151104

## 2022-05-10 LAB — PREPARE PLATELET PHERESIS: Unit division: 0

## 2022-05-10 NOTE — Telephone Encounter (Signed)
Contacted patient to scheduled appointments. Patient is aware of appointments that are scheduled.   

## 2022-05-11 ENCOUNTER — Other Ambulatory Visit: Payer: Self-pay

## 2022-05-13 ENCOUNTER — Telehealth: Payer: Self-pay

## 2022-05-13 ENCOUNTER — Other Ambulatory Visit: Payer: Self-pay

## 2022-05-13 ENCOUNTER — Inpatient Hospital Stay: Payer: Medicare Other

## 2022-05-13 VITALS — BP 112/46 | HR 70 | Temp 98.5°F | Resp 16

## 2022-05-13 DIAGNOSIS — C50212 Malignant neoplasm of upper-inner quadrant of left female breast: Secondary | ICD-10-CM

## 2022-05-13 DIAGNOSIS — D649 Anemia, unspecified: Secondary | ICD-10-CM

## 2022-05-13 DIAGNOSIS — Z170 Estrogen receptor positive status [ER+]: Secondary | ICD-10-CM

## 2022-05-13 DIAGNOSIS — D46Z Other myelodysplastic syndromes: Secondary | ICD-10-CM

## 2022-05-13 DIAGNOSIS — D469 Myelodysplastic syndrome, unspecified: Secondary | ICD-10-CM | POA: Diagnosis not present

## 2022-05-13 LAB — SAMPLE TO BLOOD BANK

## 2022-05-13 LAB — CMP (CANCER CENTER ONLY)
ALT: 9 U/L (ref 0–44)
AST: 14 U/L — ABNORMAL LOW (ref 15–41)
Albumin: 3.9 g/dL (ref 3.5–5.0)
Alkaline Phosphatase: 212 U/L — ABNORMAL HIGH (ref 38–126)
Anion gap: 5 (ref 5–15)
BUN: 13 mg/dL (ref 8–23)
CO2: 29 mmol/L (ref 22–32)
Calcium: 9.4 mg/dL (ref 8.9–10.3)
Chloride: 100 mmol/L (ref 98–111)
Creatinine: 0.62 mg/dL (ref 0.44–1.00)
GFR, Estimated: >60 mL/min (ref >60)
Glucose, Bld: 356 mg/dL — ABNORMAL HIGH (ref 70–99)
Potassium: 3.9 mmol/L (ref 3.5–5.1)
Sodium: 134 mmol/L — ABNORMAL LOW (ref 135–145)
Total Bilirubin: 0.8 mg/dL (ref 0.3–1.2)
Total Protein: 8.1 g/dL (ref 6.5–8.1)

## 2022-05-13 LAB — TYPE AND SCREEN: Antibody Screen: NEGATIVE

## 2022-05-13 LAB — CBC WITH DIFFERENTIAL (CANCER CENTER ONLY)
Abs Immature Granulocytes: 0 10*3/uL (ref 0.00–0.07)
Basophils Absolute: 0 10*3/uL (ref 0.0–0.1)
Basophils Relative: 1 %
Eosinophils Absolute: 0.1 10*3/uL (ref 0.0–0.5)
Eosinophils Relative: 5 %
HCT: 22 % — ABNORMAL LOW (ref 36.0–46.0)
Hemoglobin: 7.8 g/dL — ABNORMAL LOW (ref 12.0–15.0)
Immature Granulocytes: 0 %
Lymphocytes Relative: 71 %
Lymphs Abs: 1 10*3/uL (ref 0.7–4.0)
MCH: 31.7 pg (ref 26.0–34.0)
MCHC: 35.5 g/dL (ref 30.0–36.0)
MCV: 89.4 fL (ref 80.0–100.0)
Monocytes Absolute: 0.1 10*3/uL (ref 0.1–1.0)
Monocytes Relative: 6 %
Neutro Abs: 0.2 10*3/uL — CL (ref 1.7–7.7)
Neutrophils Relative %: 17 %
Platelet Count: 23 10*3/uL — ABNORMAL LOW (ref 150–400)
RBC: 2.46 MIL/uL — ABNORMAL LOW (ref 3.87–5.11)
RDW: 16.8 % — ABNORMAL HIGH (ref 11.5–15.5)
WBC Count: 1.4 10*3/uL — ABNORMAL LOW (ref 4.0–10.5)
nRBC: 0 % (ref 0.0–0.2)

## 2022-05-13 LAB — PREPARE RBC (CROSSMATCH)

## 2022-05-13 MED ORDER — DIPHENHYDRAMINE HCL 25 MG PO CAPS
25.0000 mg | ORAL_CAPSULE | Freq: Once | ORAL | Status: AC
  Administered 2022-05-13: 25 mg via ORAL
  Filled 2022-05-13: qty 1

## 2022-05-13 MED ORDER — ACETAMINOPHEN 325 MG PO TABS
650.0000 mg | ORAL_TABLET | Freq: Once | ORAL | Status: AC
  Administered 2022-05-13: 650 mg via ORAL
  Filled 2022-05-13: qty 2

## 2022-05-13 MED ORDER — SODIUM CHLORIDE 0.9% IV SOLUTION
250.0000 mL | Freq: Once | INTRAVENOUS | Status: AC
  Administered 2022-05-13: 250 mL via INTRAVENOUS

## 2022-05-13 NOTE — Telephone Encounter (Signed)
Critical lab value reported: ANC 0.2; Hbg 7.8; Plts 23.  Dr. Mosetta Putt notified and pt is currently being seen in [facility] for blood product replacement.

## 2022-05-13 NOTE — Patient Instructions (Signed)
Transfusin de sangre en los adultos, cuidados posteriores Blood Transfusion, Adult, Care After Despus de una transfusin de sangre, es comn presentar lo siguiente: Moretones y dolor en el lugar de la va intravenosa (IV). Dolor de cabeza. Siga estas instrucciones en su casa: El mdico podr darle ms instrucciones. Si tiene problemas, llame al mdico. Cuidados del lugar de la insercin     Siga las instrucciones del mdico en lo que respecta al cuidado del lugar de insercin. Este es el lugar donde se coloc un tubo (catter) intravenoso en la vena. Asegrese de hacer lo siguiente: Lvese las manos con agua y jabn durante al menos 20 segundos antes y despus de cambiarse la venda. Use un desinfectante para manos si no dispone de agua y jabn. Cambie las vendas como se lo haya indicado el mdico. Controle el lugar de insercin todos los das para detectar signos de infeccin. Est atento a los siguientes signos: Dolor, hinchazn o enrojecimiento. Sangrado proveniente del lugar. Calor. Pus o mal olor. Instrucciones generales Use los medicamentos de venta libre y los recetados solamente como se lo haya indicado el mdico. Haga reposo como se lo haya indicado el mdico. Retome sus actividades habituales como se lo haya indicado el mdico. Concurra a todas las visitas de seguimiento. Es posible que deba hacerse anlisis en ciertos momentos para controlar la sangre. Comunquese con un mdico si: Tiene picazn o zonas enrojecidas e hinchadas en la piel (urticaria). Tiene fiebre o escalofros. Tiene dolor de cabeza, en la espalda o el pecho. Est preocupado o nervioso (ansioso). Se siente dbil despus de realizar sus actividades habituales. Tiene alguno de los siguientes problemas en el lugar de la insercin: Enrojecimiento, hinchazn, calor o dolor. Sangrado que no se detiene al ejercer presin. Pus o mal olor. Si recibi la transfusin de sangre en un entorno para pacientes  ambulatorios, le indicarn con quin debe ponerse en contacto para informar cualquier reaccin. Solicite ayuda de inmediato si: Tiene signos de una reaccin grave. Puede deberse a una alergia o provenir del sistema de defensa del cuerpo (sistema inmunitario). Algunos signos son los siguientes: Dificultad para respirar o falta de aire. Hinchazn en la cara o sensacin de calor (sofoco). Una erupcin cutnea generalizada. Pis (orina) de color oscuro o sangre en el pis. Latidos cardacos acelerados. Estos sntomas pueden indicar una emergencia. Solicite ayuda de inmediato. Llame al 911. No espere a ver si los sntomas desaparecen. No conduzca por sus propios medios hasta el hospital. Resumen Es normal tener moretones y dolor en el lugar donde se coloc la va intravenosa (IV). Controle el lugar de insercin todos los das para detectar signos de infeccin. Haga reposo como se lo haya indicado el mdico. Retome sus actividades habituales como se lo haya indicado el mdico. Obtenga ayuda de inmediato si tiene signos de una reaccin grave. Esta informacin no tiene como fin reemplazar el consejo del mdico. Asegrese de hacerle al mdico cualquier pregunta que tenga. Document Revised: 05/06/2021 Document Reviewed: 05/06/2021 Elsevier Patient Education  2023 Elsevier Inc.  

## 2022-05-14 LAB — TYPE AND SCREEN
ABO/RH(D): O POS
Unit division: 0

## 2022-05-14 LAB — BPAM RBC
Blood Product Expiration Date: 202405172359
ISSUE DATE / TIME: 202404191047
Unit Type and Rh: 5100

## 2022-05-15 NOTE — Assessment & Plan Note (Signed)
-  Continue medication and follow-up with PCP 

## 2022-05-15 NOTE — Assessment & Plan Note (Signed)
-  cTxN2aM0, G3, ER, PR and HER-2 negative (triple negative), [id number]  -diagnosed in 04/2017, s/p neoadjuvant ddAC-TC, left mastectomy and adjuvant radiation. She had excellent response. -most recent right mammogram on 07/26/21 and right axilla US on 10/27/21 were benign. -she is on surveillance now  

## 2022-05-15 NOTE — Assessment & Plan Note (Signed)
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  Her FISH test showed 3 abnormal type, her risk of transforming to acute leukemia is very high, likely in the next 6 to 12 months -She started azacitidine injection on November 15, 2021, she was admitted to hospital after chemo, for severe profound pancytopenia, aspiration pneumonia, required intubation. She has recovered well  -Her blood counts improved after first cycle chemotherapy, likely she responded to treatment. -she started cycle 2 azacitidine on 01/25/2022, will reduce to 4 days for this cycle. If she tolerates well, will change to 5 days from next cyle  -She was evaluated for bone marrow transplant at [facility] and is open to transplant. We recommend continuing chemo for now, and reserve transplant when she progress. She is tolerating Azacitadine well.   -She has been more cytopenic lately, concerning for disease progression.  I will repeat her bone marrow biopsy which is scheduled for 05/31/2022.

## 2022-05-16 ENCOUNTER — Encounter: Payer: Self-pay | Admitting: Hematology

## 2022-05-16 ENCOUNTER — Inpatient Hospital Stay: Payer: Medicare Other

## 2022-05-16 ENCOUNTER — Encounter: Payer: Self-pay | Admitting: Nurse Practitioner

## 2022-05-16 ENCOUNTER — Inpatient Hospital Stay (HOSPITAL_BASED_OUTPATIENT_CLINIC_OR_DEPARTMENT_OTHER): Payer: Medicare Other | Admitting: Hematology

## 2022-05-16 ENCOUNTER — Other Ambulatory Visit: Payer: Self-pay

## 2022-05-16 ENCOUNTER — Ambulatory Visit: Payer: Medicare Other | Attending: Nurse Practitioner | Admitting: Nurse Practitioner

## 2022-05-16 VITALS — BP 123/58 | HR 69 | Ht 60.0 in | Wt 122.2 lb

## 2022-05-16 VITALS — BP 124/50 | HR 64 | Temp 98.3°F | Resp 18 | Ht 60.0 in | Wt 120.6 lb

## 2022-05-16 DIAGNOSIS — I10 Essential (primary) hypertension: Secondary | ICD-10-CM | POA: Diagnosis not present

## 2022-05-16 DIAGNOSIS — C50212 Malignant neoplasm of upper-inner quadrant of left female breast: Secondary | ICD-10-CM

## 2022-05-16 DIAGNOSIS — E1169 Type 2 diabetes mellitus with other specified complication: Secondary | ICD-10-CM

## 2022-05-16 DIAGNOSIS — Z713 Dietary counseling and surveillance: Secondary | ICD-10-CM | POA: Insufficient documentation

## 2022-05-16 DIAGNOSIS — Z79899 Other long term (current) drug therapy: Secondary | ICD-10-CM | POA: Insufficient documentation

## 2022-05-16 DIAGNOSIS — D469 Myelodysplastic syndrome, unspecified: Secondary | ICD-10-CM | POA: Diagnosis not present

## 2022-05-16 DIAGNOSIS — Z794 Long term (current) use of insulin: Secondary | ICD-10-CM | POA: Diagnosis not present

## 2022-05-16 DIAGNOSIS — Z170 Estrogen receptor positive status [ER+]: Secondary | ICD-10-CM

## 2022-05-16 DIAGNOSIS — D649 Anemia, unspecified: Secondary | ICD-10-CM

## 2022-05-16 DIAGNOSIS — D46Z Other myelodysplastic syndromes: Secondary | ICD-10-CM | POA: Diagnosis not present

## 2022-05-16 DIAGNOSIS — E1165 Type 2 diabetes mellitus with hyperglycemia: Secondary | ICD-10-CM | POA: Diagnosis not present

## 2022-05-16 DIAGNOSIS — K219 Gastro-esophageal reflux disease without esophagitis: Secondary | ICD-10-CM | POA: Insufficient documentation

## 2022-05-16 DIAGNOSIS — E785 Hyperlipidemia, unspecified: Secondary | ICD-10-CM

## 2022-05-16 LAB — CBC WITH DIFFERENTIAL (CANCER CENTER ONLY)
Abs Immature Granulocytes: 0 10*3/uL (ref 0.00–0.07)
Basophils Absolute: 0 10*3/uL (ref 0.0–0.1)
Basophils Relative: 2 %
Blasts: 1 %
Eosinophils Absolute: 0.1 10*3/uL (ref 0.0–0.5)
Eosinophils Relative: 7 %
HCT: 27.8 % — ABNORMAL LOW (ref 36.0–46.0)
Hemoglobin: 9.6 g/dL — ABNORMAL LOW (ref 12.0–15.0)
Lymphocytes Relative: 72 %
Lymphs Abs: 1.1 10*3/uL (ref 0.7–4.0)
MCH: 30.8 pg (ref 26.0–34.0)
MCHC: 34.5 g/dL (ref 30.0–36.0)
MCV: 89.1 fL (ref 80.0–100.0)
Monocytes Absolute: 0.1 10*3/uL (ref 0.1–1.0)
Monocytes Relative: 8 %
Neutro Abs: 0.2 10*3/uL — CL (ref 1.7–7.7)
Neutrophils Relative %: 10 %
Platelet Count: 18 10*3/uL — ABNORMAL LOW (ref 150–400)
RBC: 3.12 MIL/uL — ABNORMAL LOW (ref 3.87–5.11)
RDW: 16.6 % — ABNORMAL HIGH (ref 11.5–15.5)
WBC Count: 1.5 10*3/uL — ABNORMAL LOW (ref 4.0–10.5)
nRBC: 0 % (ref 0.0–0.2)

## 2022-05-16 LAB — CMP (CANCER CENTER ONLY)
ALT: 11 U/L (ref 0–44)
AST: 18 U/L (ref 15–41)
Albumin: 4 g/dL (ref 3.5–5.0)
Alkaline Phosphatase: 214 U/L — ABNORMAL HIGH (ref 38–126)
Anion gap: 8 (ref 5–15)
BUN: 20 mg/dL (ref 8–23)
CO2: 26 mmol/L (ref 22–32)
Calcium: 9.7 mg/dL (ref 8.9–10.3)
Chloride: 99 mmol/L (ref 98–111)
Creatinine: 0.63 mg/dL (ref 0.44–1.00)
GFR, Estimated: >60 mL/min (ref >60)
Glucose, Bld: 423 mg/dL — ABNORMAL HIGH (ref 70–99)
Potassium: 4 mmol/L (ref 3.5–5.1)
Sodium: 133 mmol/L — ABNORMAL LOW (ref 135–145)
Total Bilirubin: 0.9 mg/dL (ref 0.3–1.2)
Total Protein: 8.5 g/dL — ABNORMAL HIGH (ref 6.5–8.1)

## 2022-05-16 LAB — SAMPLE TO BLOOD BANK

## 2022-05-16 LAB — POCT GLYCOSYLATED HEMOGLOBIN (HGB A1C): Hemoglobin A1C: 10 % — AB (ref 4.0–5.6)

## 2022-05-16 LAB — PREPARE PLATELET PHERESIS

## 2022-05-16 LAB — BPAM PLATELET PHERESIS: ISSUE DATE / TIME: 202404221157

## 2022-05-16 LAB — TYPE AND SCREEN
ABO/RH(D): O POS
Antibody Screen: NEGATIVE

## 2022-05-16 MED ORDER — FREESTYLE LIBRE 2 SENSOR MISC
6 refills | Status: DC
Start: 2022-05-16 — End: 2022-09-01

## 2022-05-16 MED ORDER — LOSARTAN POTASSIUM 50 MG PO TABS: 50.0000 mg | ORAL_TABLET | Freq: Every day | ORAL | 1 refills | Status: DC

## 2022-05-16 MED ORDER — ACETAMINOPHEN 325 MG PO TABS
650.0000 mg | ORAL_TABLET | Freq: Once | ORAL | Status: AC
  Administered 2022-05-16: 650 mg via ORAL
  Filled 2022-05-16: qty 2

## 2022-05-16 MED ORDER — SODIUM CHLORIDE 0.9% IV SOLUTION
250.0000 mL | Freq: Once | INTRAVENOUS | Status: AC
  Administered 2022-05-16: 250 mL via INTRAVENOUS

## 2022-05-16 MED ORDER — DIPHENHYDRAMINE HCL 25 MG PO CAPS
25.0000 mg | ORAL_CAPSULE | Freq: Once | ORAL | Status: AC
  Administered 2022-05-16: 25 mg via ORAL
  Filled 2022-05-16: qty 1

## 2022-05-16 MED ORDER — PANTOPRAZOLE SODIUM 40 MG PO TBEC: 40.0000 mg | DELAYED_RELEASE_TABLET | Freq: Every day | ORAL | 1 refills | Status: DC

## 2022-05-16 MED ORDER — FREESTYLE LIBRE READER DEVI
0 refills | Status: DC
Start: 2022-05-16 — End: 2022-09-01

## 2022-05-16 MED ORDER — AMLODIPINE BESYLATE 5 MG PO TABS
5.0000 mg | ORAL_TABLET | Freq: Every day | ORAL | 1 refills | Status: DC
Start: 2022-05-16 — End: 2022-09-01

## 2022-05-16 MED ORDER — SEMGLEE (YFGN) 100 UNIT/ML ~~LOC~~ SOPN
20.0000 [IU] | PEN_INJECTOR | Freq: Two times a day (BID) | SUBCUTANEOUS | 1 refills | Status: DC
Start: 2022-05-16 — End: 2022-05-17

## 2022-05-16 NOTE — Progress Notes (Signed)
Assessment & Plan:  Amanda Duarte was seen today for diabetes and hypertension.  Diagnoses and all orders for this visit:  Type 2 diabetes mellitus with hyperglycemia, with long-term current use of insulin Added humalog sliding scale insulin today -     POCT glycosylated hemoglobin (Hb A1C) -     SEMGLEE, YFGN, 100 UNIT/ML Pen; Inject 20 Units into the skin 2 (two) times daily. -     Continuous Glucose Receiver (FREESTYLE LIBRE READER) DEVI; Monitor blood glucose levels 5-6 times per day E11.65 -     Continuous Glucose Sensor (FREESTYLE LIBRE 2 SENSOR) MISC; Monitor blood glucose levels 5-6 times per day E11.65  Essential hypertension Well controlled -     amLODipine (NORVASC) 5 MG tablet; Take 1 tablet (5 mg total) by mouth daily. For blood pressure -     losartan (COZAAR) 50 MG tablet; Take 1 tablet (50 mg total) by mouth daily. Continue all antihypertensives as prescribed.  Reminded to bring in blood pressure log for follow  up appointment.  RECOMMENDATIONS: DASH/Mediterranean Diets are healthier choices for HTN.    GERD without esophagitis -     pantoprazole (PROTONIX) 40 MG tablet; Take 1 tablet (40 mg total) by mouth daily at 6 (six) AM. For heartburn INSTRUCTIONS: Avoid GERD Triggers: acidic, spicy or fried foods, caffeine, coffee, sodas,  alcohol and chocolate.    Malignant neoplasm of upper-inner quadrant of left breast in female, estrogen receptor positive Follow up with hematology as scheduled   Patient has been counseled on age-appropriate routine health concerns for screening and prevention. These are reviewed and up-to-date. Referrals have been placed accordingly. Immunizations are up-to-date or declined.    Subjective:   Chief Complaint  Patient presents with   Diabetes   Hypertension   HPI Amanda Duarte 67 y.o. female presents to office today for HTN and DM  VRI was used to communicate directly with patient for the entire encounter including providing detailed patient  instructions.     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 She is asymptomatic and currently taking amlodipine 5 mg daily and losartan 50 mg daily BP Readings from Last 3 Encounters:  05/16/22 (!) 110/52  05/16/22 (!) 124/50  05/16/22 (!) 123/58    DM 2 Poorly controlled. SHe endorses adherence taking semglee 20 units BID and humalog 10 units TID. Will add sliding scale today and freestyle libre. She states this was not given to her at her last visit with me when it was prescribed.  Lab Results  Component Value Date   HGBA1C 10.0 (A) 05/16/2022      Review of Systems  Constitutional:  Negative for fever, malaise/fatigue and weight loss.  HENT: Negative.  Negative for nosebleeds.   Eyes: Negative.  Negative for blurred vision, double vision and photophobia.  Respiratory: Negative.  Negative for cough and shortness of breath.   Cardiovascular: Negative.  Negative for chest pain, palpitations and leg swelling.  Gastrointestinal: Negative.  Negative for heartburn, nausea and vomiting.  Musculoskeletal: Negative.  Negative for myalgias.  Neurological: Negative.  Negative for dizziness, focal weakness, seizures and headaches.  Psychiatric/Behavioral: Negative.  Negative for suicidal ideas.     Past Medical History:  Diagnosis Date   Cancer 03/2017  left breast cancer   Diabetes mellitus without complication    Hyperlipidemia    Hypertension    Malignant neoplasm of left breast    Stroke (cerebrum)    Tibial plateau fracture, left    04-13-17 had ORIF    Past Surgical History:  Procedure Laterality Date   MASTECTOMY Left 2019   MASTECTOMY MODIFIED RADICAL Left 11/20/2017   Procedure: LEFT  MODIFIED RADICAL MASTECTOMY;  Surgeon: Claud Kelp, MD;  Location: [facility] OR;  Service: General;  Laterality: Left;   ORIF TIBIA PLATEAU Left 04/13/2017   Procedure: OPEN REDUCTION INTERNAL FIXATION (ORIF) TIBIAL PLATEAU;  Surgeon: Roby Lofts, MD;  Location: [facility] OR;  Service: Orthopedics;  Laterality: Left;   PORT-A-CATH REMOVAL Right 11/20/2017   Procedure: REMOVAL PORT-A-CATH;  Surgeon: Claud Kelp, MD;  Location: [facility] OR;  Service: General;  Laterality: Right;   PORTACATH PLACEMENT Right 05/31/2017   Procedure: INSERTION PORT-A-CATH;  Surgeon: Claud Kelp, MD;  Location: [facility];  Service: General;  Laterality: Right;    Family History  Problem Relation Age of Onset   Heart Problems Mother    Diabetes Son    Breast cancer Neg Hx    Colon cancer Neg Hx    Rectal cancer Neg Hx    Stomach cancer Neg Hx    Esophageal cancer Neg Hx     Social History Reviewed with no changes to be made today.   Outpatient Medications Prior to Visit  Medication Sig Dispense Refill   atorvastatin (LIPITOR) 40 MG tablet Take 1 tablet (40 mg total) by mouth daily. For cholesterol 90 tablet 2   Blood Pressure Monitor DEVI Please provide patient with insurance approved blood pressure monitor 1 each 0   citalopram (CELEXA) 20 MG tablet Take 1 tablet (20 mg total) by mouth daily. For depression 90 tablet 3   fenofibrate (TRICOR) 145 MG tablet Take 1 tablet (145 mg total) by mouth daily. For cholesterol 90 tablet 2   fluticasone (FLONASE) 50 MCG/ACT nasal spray Place 2 sprays into both nostrils daily. 16 g 6   insulin lispro (HUMALOG KWIKPEN) 100 UNIT/ML KwikPen Inject 10 Units into the skin 3 (three) times daily. 15 mL 1   Insulin Pen Needle 31G X 5 MM MISC Inject 1 Device into the skin QID. For use with insulin pens 200 each 6   Insulin Syringe-Needle U-100 30G X 5/16" 1 ML MISC Use as directed to inject into the skin 3 times daily. 200 each 6   levofloxacin (LEVAQUIN) 500 MG tablet  Take 1 tablet (500 mg total) by mouth daily. 30 tablet 1   ondansetron (ZOFRAN) 8 MG tablet Take 1 tablet (8 mg total) by mouth every 8 (eight) hours as needed for nausea or vomiting. 30 tablet 1   potassium chloride SA (KLOR-CON M) 20 MEQ tablet Take 1 tablet (20 mEq total) by mouth daily. 30 tablet 3   prochlorperazine (COMPAZINE) 10 MG tablet Take 1 tablet (10 mg total) by mouth every 6 (six) hours as needed for nausea or vomiting. 30 tablet 1   Accu-Chek Softclix Lancets lancets Use to check blood sugar 3 times daily. E11.65 100 each 3   amLODipine (NORVASC) 5 MG tablet Take 1 tablet (5 mg total) by mouth daily. For blood pressure 90 tablet 1   Blood Glucose Monitoring Suppl (ACCU-CHEK GUIDE) w/Device KIT use kit to check blood glucose three times daily 1 kit 0   Continuous Blood Gluc Receiver (FREESTYLE LIBRE READER) DEVI Monitor blood  glucose levels 5-6 times per day E11.65 1 each 0   Continuous Blood Gluc Sensor (FREESTYLE LIBRE 2 SENSOR) MISC Monitor blood glucose levels 5-6 times per day E11.65 3 each 6   glucose blood (ACCU-CHEK GUIDE) test strip Use to check blood sugar 3 times daily. E11.65 100 each 11   hydrOXYzine (ATARAX) 10 MG tablet Take 1 tablet (10 mg total) by mouth 3 (three) times daily as needed for anxiety. 60 tablet 3   insulin glargine (LANTUS) 100 UNIT/ML Solostar Pen Inject 20 Units into the skin 2 (two) times daily. 36 mL 1   losartan (COZAAR) 50 MG tablet Take 1 tablet (50 mg total) by mouth daily. 30 tablet 0   pantoprazole (PROTONIX) 40 MG tablet Take 1 tablet (40 mg total) by mouth daily at 6 (six) AM. For heartburn 30 tablet 0   SEMGLEE, YFGN, 100 UNIT/ML Pen Inject 20 Units into the skin at bedtime.     Facility-Administered Medications Prior to Visit  Medication Dose Route Frequency Provider Last Rate Last Admin   heparin lock flush 100 unit/mL  500 Units Intracatheter Once PRN Malachy Mood, MD       sodium chloride flush (NS) 0.9 % injection 10 mL  10 mL  Intracatheter PRN Malachy Mood, MD        Allergies  Allergen Reactions   Victoza [Liraglutide] Nausea And Vomiting       Objective:    BP (!) 123/58   Pulse 69   Ht 5' (1.524 m)   Wt 122 lb 3.2 oz (55.4 kg)   SpO2 100%   BMI 23.87 kg/m  Wt Readings from Last 3 Encounters:  05/16/22 120 lb 9.6 oz (54.7 kg)  05/16/22 122 lb 3.2 oz (55.4 kg)  05/09/22 121 lb 9.6 oz (55.2 kg)    Physical Exam Vitals and nursing note reviewed.  Constitutional:      Appearance: She is well-developed.  HENT:     Head: Normocephalic and atraumatic.  Cardiovascular:     Rate and Rhythm: Normal rate and regular rhythm.     Heart sounds: Normal heart sounds. No murmur heard.    No friction rub. No gallop.  Pulmonary:     Effort: Pulmonary effort is normal. No tachypnea or respiratory distress.     Breath sounds: Normal breath sounds. No decreased breath sounds, wheezing, rhonchi or rales.  Chest:     Chest wall: No tenderness.  Abdominal:     General: Bowel sounds are normal.     Palpations: Abdomen is soft.  Musculoskeletal:        General: Normal range of motion.     Cervical back: Normal range of motion.  Skin:    General: Skin is warm and dry.  Neurological:     Mental Status: She is alert and oriented to person, place, and time.     Coordination: Coordination normal.  Psychiatric:        Behavior: Behavior normal. Behavior is cooperative.        Thought Content: Thought content normal.        Judgment: Judgment normal.          Patient has been counseled extensively about nutrition and exercise as well as the importance of adherence with medications and regular follow-up. The patient was given clear instructions to go to ER or return to medical center if symptoms don't improve, worsen or new problems develop. The patient verbalized understanding.   Follow-up: Return in 3 months (on 08/17/2022)  for can not do appointments on mondays. F/u in 3 months on 08-17-2022.   Claiborne Rigg, FNP-BC [facility] [place], [place] [phone]   05/16/2022, 11:16 PM

## 2022-05-16 NOTE — Progress Notes (Signed)
[facility]   [phone] [phone]   Clinic Follow up Note   Patient Care Team: Malachy Mood, MD as PCP - General (Hematology) Malachy Mood, MD as Consulting Physician (Hematology) Claud Kelp, MD as Consulting Physician (General Surgery) Pollyann Samples, NP as Nurse Practitioner (Nurse Practitioner) Antony Blackbird, MD as Consulting Physician (Radiation Oncology) Earl Lites, MD as Referring Physician (Hematology and Oncology) Riley Churches, RN as [facility] Care Management Rosaria Ferries, Orpah Clinton, MD as Referring Physician (Hematology and Oncology)  Date of Service:  05/16/2022  CHIEF COMPLAINT: f/u of high grade MDS and h/o breast cancer     CURRENT THERAPY:  Azacitadine injection, days 1-5, started on 11/15/21  (first cycle 7 days); C2 x4 days, subsequent cycles daily x5 days  Platelet transfusion if platelet count less than 20 K Blood transfusion if hemoglobin less than 8.0   ASSESSMENT:  KINDEL Duarte is a 67 y.o. 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  Her FISH test showed 3 abnormal type, her risk of transforming to acute leukemia is very high, likely in the next 6 to 12 months -She started azacitidine injection on November 15, 2021, she was admitted to hospital after chemo, for severe profound pancytopenia, aspiration pneumonia, required intubation. She has recovered well  -Her blood counts improved after first cycle chemotherapy, likely she responded to treatment. -she started cycle 2 azacitidine on 01/25/2022, will reduce to 4 days for this cycle. If she tolerates well, will change to 5 days from next cyle  -She was evaluated for bone marrow transplant at [facility] and is open to transplant. We recommend  continuing chemo for now, and reserve transplant when she progress. She is tolerating Azacitadine well.   -She has been more cytopenic lately, concerning for disease progression.  I will repeat her bone marrow biopsy which is scheduled for 05/31/2022.   -Lab reviewed, platelet 18 today, will proceed with 1 unit platelet transfusion.  Will cancer her chemotherapy this week due to her severe thrombocytopenia. -I discussed gust the bone marrow transplant with her, and encouraged her to follow-up at the [facility].  She is concerned that she may not have transportation  Malignant neoplasm of upper-inner quadrant of left breast in female, estrogen receptor negative (HCC) cTxN2aM0, G3, ER, PR and HER-2 negative (triple negative), [id number]  -diagnosed in 04/2017, s/p neoadjuvant ddAC-TC, left mastectomy and adjuvant radiation. She had excellent response. -most recent right mammogram on 07/26/21 and right axilla [name] on 10/27/21 were benign. -she is on surveillance now     Essential hypertension -Continue medication and follow-up with PCP     Type 2 diabetes mellitus with hyperlipidemia (HCC) -Continue medication and follow-up with PCP     PLAN: -lab reviewed  - No treatment this week, will proceed with 1 unit of platelet transfusion today -Bone Marrow Biopsy schedule 5/7 , but need move the date up. I called IR  -encourage the pt to take precaution and try not fall due to low platelet count. - I will schedule Lab and blood transfusion twice a week for next few weeks -f/u after BM biopsy    SUMMARY OF ONCOLOGIC HISTORY: Oncology History Overview Note  Cancer Staging Malignant neoplasm of upper-inner quadrant of left breast in female, estrogen receptor positive (HCC) Staging form:  Breast, AJCC 8th Edition - Clinical stage from 05/01/2017: Stage Unknown (cTX, cN2, cM0, G3, ER-, PR-, HER2-) - Signed by Malachy Mood, MD on 11/01/2017 - Pathologic stage from 11/20/2017: No Stage Recommended (ypT0, pN83mi,  cM0, GX, ER-, PR-, HER2-) - Signed by Malachy Mood, MD on 12/10/2017     Malignant neoplasm of upper-inner quadrant of left breast in female, estrogen receptor negative (HCC)  04/28/2017 Mammogram   IMPRESSION: Two adjacent masses/enlarged lymph nodes in the LOWER LEFT axilla, the largest measuring 2.1 cm. Tissue sampling of 1 of these is recommended to exclude malignancy/lymphoma. No mammographic evidence of breast malignancy bilaterally.    05/01/2017 Initial Biopsy   Diagnosis 05/01/17 Lymph node, needle/core biopsy, low left inferior axillary - METASTATIC POORLY DIFFERENTIATED CARCINOMA TO A LYMPH NODE. SEE NOTE.   05/01/2017 Cancer Staging   Staging form: Breast, AJCC 8th Edition - Clinical stage from 05/01/2017: Stage Unknown (cTX, cN1, cM0, G3, ER-, PR-, HER2-) - Signed by Malachy Mood, MD on 06/02/2017    05/01/2017 Receptors her2   Lymph Node Biopsy:  HER2-Negative  PR-Negative  ER- Negative    05/10/2017 Imaging   MR Breast W WO Contrast 05/10/17 IMPRESSION: No MRI evidence of malignancy in the right breast. Area of week stippled non mass enhancement in the left breast upper inner quadrant. Separate area of thin linear non mass enhancement in the subareolar left breast. Three grossly abnormal left axillary lymph nodes, and more than 4 less than 1 cm indeterminate left axillary lymph nodes. No evidence of right axillary lymphadenopathy.   05/17/2017 Initial Biopsy   Diagnosis 05/17/17 1. 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 2. Breast, left, needle core biopsy, upper inner post MR enhancement - MICROSCOPIC FOCUS OF DUCTAL CARCINOMA IN SITU.   05/17/2017 Receptors her2   Left Breast Biopsy:  ER-Negative PR-Negative HER2-Negative   05/22/2017 Initial Diagnosis   Malignant neoplasm of upper-inner quadrant of left breast in female, estrogen receptor positive (HCC)   06/01/2017 Imaging   Boen  Scan 06/01/17 IMPRESSION: No definite scintigraphic evidence of osseous metastatic disease.   Posttraumatic and postsurgical uptake at the LEFT knee.   Nonspecific soft tissue distribution of tracer at the LEFT thigh, could represent contusion, hemorrhage, soft tissue edema, or soft tissue calcifications such as from heterotopic calcification and myositis ossificans; recommend clinical correlation and consider dedicated LEFT femoral radiographs.   Single focus of nonspecific increased tracer localization at the lateral LEFT orbit.     06/01/2017 Imaging   06/01/2017 Bone Scan IMPRESSION: No definite scintigraphic evidence of osseous metastatic disease.   Posttraumatic and postsurgical uptake at the LEFT knee.   Nonspecific soft tissue distribution of tracer at the LEFT thigh, could represent contusion, hemorrhage, soft tissue edema, or soft tissue calcifications such as from heterotopic calcification and myositis ossificans; recommend clinical correlation and consider dedicated LEFT femoral radiographs.   Single focus of nonspecific increased tracer localization at the lateral LEFT orbit.   06/09/2017 -  Chemotherapy   ddAC every 2 weeks for 4 weeks starting 06/09/17-07/21/17 followed by weekly [place] and taxol for 12 weeks 08/04/17-10/20/17.    10/24/2017 Imaging   Breast MRI B/l 10/24/17 IMPRESSION: 1. No residual enhancement in the LEFT breast following neoadjuvant treatment. 2. Significantly smaller LEFT axillary lymph nodes, largest now measuring 1.4 centimeters. The remainder of LEFT axillary lymph nodes demonstrate normal fatty hila. RECOMMENDATION: Treatment plan for known LEFT breast cancer.   11/20/2017  Cancer Staging   Staging form: Breast, AJCC 8th Edition - Pathologic stage from 11/20/2017: No Stage Recommended (ypT0, pN17mi, cM0, GX, ER-, PR-, HER2-) - Signed by Malachy Mood, MD on 12/10/2017   11/20/2017 Surgery   Left mastectomy and SLN biopsy by Dr. Derrell Lolling     11/20/2017 Pathology Results   Breast, modified radical mastectomy , left - MICROSCOPIC FOCI OF RESIDUAL METASTATIC CARCINOMA INVOLVING TWO OF EIGHT LYMPH NODES (2/8); LARGEST CONTINUOUS FOCUS MEASURES 0.1 CM. - NO EVIDENCE OF RESIDUAL CARCINOMA IN THE MASTECTOMY SPECIMEN - MARKED THERAPY-RELATED CHANGES, INCLUDING DENSE HYALIN FIBROSIS     11/20/2017 Receptors her2   ER- PR- HER2- (IHC 1+)   01/02/2018 - 02/14/2018 Radiation Therapy   Adjuvant Radiation 01/02/18 - 02/14/18   07/31/2018 Imaging   Baseline DEXA 07/31/18  ASSESSMENT: The BMD measured at AP Spine L1-L4 is 0.973 g/cm2 with a T-score of -1.7.   This patient is considered OSTEOPENIC according to World Health Organization [facility]) criteria. The scan quality is good.   Site Region Measured Date Measured Age YA T-score BMD Significant CHANGE   AP Spine  L1-L4      07/31/2018    63.1         -1.7    0.973 g/cm2   DualFemur Neck Left  07/31/2018    63.1         -1.5    0.828 g/cm2   DualFemur Total Mean 07/31/2018    63.1         0.1     1.016 g/cm2   11/05/2018 Survivorship   Per Santiago Glad, NP    11/15/2021 -  Chemotherapy   Patient is on Treatment Plan : MYELODYSPLASIA  Azacitidine SQ D1-7 q28d     02/03/2022 Imaging    IMPRESSION: No active cardiopulmonary disease. No evidence of pneumonia or pulmonary edema. Possible mild chronic interstitial lung disease.      INTERVAL HISTORY: LYNNEL ZANETTI is here for a follow up of high grade MDS and h/o breast cancer    She was last seen by me on 05/09/2022. She presents to the clinic accompanied by interpreter. Pt stated after receiving the platelets she fell much better. Pt denied having any bleeding.  All other systems were reviewed with the patient and are negative.  MEDICAL HISTORY:  Past Medical History:  Diagnosis Date   Cancer 03/2017   left breast cancer   Diabetes mellitus without complication    Hyperlipidemia    Hypertension    Malignant neoplasm of  left breast    Stroke (cerebrum)    Tibial plateau fracture, left    04-13-17 had ORIF    SURGICAL HISTORY: Past Surgical History:  Procedure Laterality Date   MASTECTOMY Left 2019   MASTECTOMY MODIFIED RADICAL Left 11/20/2017   Procedure: LEFT MODIFIED RADICAL MASTECTOMY;  Surgeon: Claud Kelp, MD;  Location: [facility] OR;  Service: General;  Laterality: Left;   ORIF TIBIA PLATEAU Left 04/13/2017   Procedure: OPEN REDUCTION INTERNAL FIXATION (ORIF) TIBIAL PLATEAU;  Surgeon: Roby Lofts, MD;  Location: [facility] OR;  Service: Orthopedics;  Laterality: Left;   PORT-A-CATH REMOVAL Right 11/20/2017   Procedure: REMOVAL PORT-A-CATH;  Surgeon: Claud Kelp, MD;  Location: [facility] OR;  Service: General;  Laterality: Right;   PORTACATH PLACEMENT Right 05/31/2017   Procedure: INSERTION PORT-A-CATH;  Surgeon: Claud Kelp, MD;  Location: [facility];  Service: General;  Laterality: Right;    I have reviewed the social history and family history with  the patient and they are unchanged from previous note.  ALLERGIES:  is allergic to victoza [liraglutide].  MEDICATIONS:  Current Outpatient Medications  Medication Sig Dispense Refill   amLODipine (NORVASC) 5 MG tablet Take 1 tablet (5 mg total) by mouth daily. For blood pressure 90 tablet 1   atorvastatin (LIPITOR) 40 MG tablet Take 1 tablet (40 mg total) by mouth daily. For cholesterol 90 tablet 2   Blood Pressure Monitor DEVI Please provide patient with insurance approved blood pressure monitor 1 each 0   citalopram (CELEXA) 20 MG tablet Take 1 tablet (20 mg total) by mouth daily. For depression 90 tablet 3   Continuous Glucose Receiver (FREESTYLE LIBRE READER) DEVI Monitor blood glucose levels 5-6 times per day E11.65 1 each 0   Continuous Glucose Sensor (FREESTYLE LIBRE 2 SENSOR) MISC Monitor blood glucose levels 5-6 times per day E11.65 3 each 6   fenofibrate (TRICOR) 145 MG tablet Take 1 tablet (145 mg total) by mouth daily. For  cholesterol 90 tablet 2   fluticasone (FLONASE) 50 MCG/ACT nasal spray Place 2 sprays into both nostrils daily. 16 g 6   insulin lispro (HUMALOG KWIKPEN) 100 UNIT/ML KwikPen Inject 10 Units into the skin 3 (three) times daily. 15 mL 1   Insulin Pen Needle 31G X 5 MM MISC Inject 1 Device into the skin QID. For use with insulin pens 200 each 6   Insulin Syringe-Needle U-100 30G X 5/16" 1 ML MISC Use as directed to inject into the skin 3 times daily. 200 each 6   levofloxacin (LEVAQUIN) 500 MG tablet Take 1 tablet (500 mg total) by mouth daily. 30 tablet 1   losartan (COZAAR) 50 MG tablet Take 1 tablet (50 mg total) by mouth daily. 90 tablet 1   ondansetron (ZOFRAN) 8 MG tablet Take 1 tablet (8 mg total) by mouth every 8 (eight) hours as needed for nausea or vomiting. 30 tablet 1   pantoprazole (PROTONIX) 40 MG tablet Take 1 tablet (40 mg total) by mouth daily at 6 (six) AM. For heartburn 90 tablet 1   potassium chloride SA (KLOR-CON M) 20 MEQ tablet Take 1 tablet (20 mEq total) by mouth daily. 30 tablet 3   prochlorperazine (COMPAZINE) 10 MG tablet Take 1 tablet (10 mg total) by mouth every 6 (six) hours as needed for nausea or vomiting. 30 tablet 1   SEMGLEE, YFGN, 100 UNIT/ML Pen Inject 20 Units into the skin 2 (two) times daily. 36 mL 1   No current facility-administered medications for this visit.   Facility-Administered Medications Ordered in Other Visits  Medication Dose Route Frequency Provider Last Rate Last Admin   heparin lock flush 100 unit/mL  500 Units Intracatheter Once PRN Malachy Mood, MD       sodium chloride flush (NS) 0.9 % injection 10 mL  10 mL Intracatheter PRN Malachy Mood, MD        PHYSICAL EXAMINATION: ECOG PERFORMANCE STATUS: 1 - Symptomatic but completely ambulatory  Vitals:   05/16/22 1051  BP: (!) 124/50  Pulse: 64  Resp: 18  Temp: 98.3 F (36.8 C)  SpO2: 100%   Wt Readings from Last 3 Encounters:  05/16/22 120 lb 9.6 oz (54.7 kg)  05/16/22 122 lb 3.2 oz (55.4  kg)  05/09/22 121 lb 9.6 oz (55.2 kg)     GENERAL:alert, no distress and comfortable SKIN: skin color normal, no rashes or significant lesions EYES: normal, Conjunctiva are pink and non-injected, sclera clear  NEURO: alert & oriented  x 3 with fluent speech   LABORATORY DATA:  I have reviewed the data as listed    Latest Ref Rng & Units 05/16/2022   10:34 AM 05/13/2022    8:46 AM 05/09/2022    9:19 AM  CBC  WBC 4.0 - 10.5 K/uL 1.5  1.4  1.5   Hemoglobin 12.0 - 15.0 g/dL 9.6  7.8  8.3   Hematocrit 36.0 - 46.0 % 27.8  22.0  23.3   Platelets 150 - 400 K/uL Latest Ref Rng & Units 05/13/2022    8:46 AM 05/09/2022    9:19 AM 05/02/2022    7:52 AM  CMP  Glucose 70 - 99 mg/dL 161  096  045   BUN 8 - 23 mg/dL Creatinine 0.44 - 1.00 mg/dL 4.09  8.11  9.14   Sodium 135 - 145 mmol/L 134  136  136   Potassium 3.5 - 5.1 mmol/L 3.9  3.8  3.8   Chloride 98 - 111 mmol/L 100  105  103   CO2 22 - 32 mmol/L Calcium 8.9 - 10.3 mg/dL 9.4  9.3  9.5   Total Protein 6.5 - 8.1 g/dL 8.1  7.8  8.0   Total Bilirubin 0.3 - 1.2 mg/dL 0.8  0.6  0.6   Alkaline Phos 38 - 126 U/L 212  207  205   AST 15 - 41 U/L ALT 0 - 44 U/L RADIOGRAPHIC STUDIES: I have personally reviewed the radiological images as listed and agreed with the findings in the report. No results found.    No orders of the defined types were placed in this encounter.  All questions were answered. The patient knows to call the clinic with any problems, questions or concerns. No barriers to learning was detected. The total time spent in the appointment was 30 minutes.     Malachy Mood, MD 05/16/2022   Carolin Coy, CMA, am acting as scribe for Malachy Mood, MD.   I have reviewed the above documentation for accuracy and completeness, and I agree with the above.

## 2022-05-16 NOTE — Patient Instructions (Addendum)
Transfusin de sangre en los adultos, cuidados posteriores Blood Transfusion, Adult, Care After La siguiente informacin ofrece orientacin sobre cmo cuidarse despus del procedimiento. El mdico tambin podr darle instrucciones ms especficas. Comunquese con el mdico si tiene problemas o preguntas. Qu puedo esperar despus del procedimiento? Despus del procedimiento, es normal tener los siguientes sntomas: Dolor y moretones en el lugar donde se coloc la va intravenosa (IV). Dolor de cabeza. Siga estas instrucciones en su casa: Cuidado del lugar de la insercin de la va intravenosa (IV)     Siga las instrucciones del mdico acerca del cuidado del lugar de la insercin de la va intravenosa (IV). Asegrese de hacer lo siguiente: Lvese las manos con agua y jabn durante al menos 20 segundos antes y despus de cambiarse la venda (vendaje). Use desinfectante para manos si no dispone de agua y jabn. Cambie los vendajes como se lo haya indicado el mdico. Controle el lugar de insercin de la va intravenosa (IV) todos los das para descartar signos de infeccin. Est atento a los siguientes signos: Dolor, hinchazn o enrojecimiento. Sangrado proveniente del lugar. Calor. Pus o mal olor. Instrucciones generales Use los medicamentos de venta libre y los recetados solamente como se lo haya indicado el mdico. Haga reposo como se lo haya indicado el mdico. Retome sus actividades normales como se lo haya indicado el mdico. Concurra a todas las visitas de seguimiento. Es posible que sea necesario realizar anlisis de laboratorio en ciertos perodos para volver a controlar sus recuentos sanguneos. Comunquese con un mdico si: Tiene picazn o zonas enrojecidas e hinchadas en la piel (urticaria). Tiene fiebre o escalofros. Tiene dolor de cabeza, en la espalda o el pecho. Se siente ansioso o dbil despus de realizar sus actividades habituales. Tiene enrojecimiento, hinchazn, calor  o dolor alrededor del lugar de la insercin de la va intravenosa. Observa sangre que sale del lugar de la insercin de la va intravenosa que no se detiene al ejercer presin. Tiene pus o percibe mal olor que proviene del lugar de la insercin de la va intravenosa. Si recibi la transfusin de sangre en un entorno para pacientes ambulatorios, le indicarn con quin debe ponerse en contacto para informar cualquier reaccin. Solicite ayuda de inmediato si: Tiene signos de una reaccin alrgica grave o de una reaccin del sistema inmunitario, por ejemplo: Dificultad para respirar o falta de aire. Hinchazn de la cara, sensacin de sofoco o erupcin cutnea generalizada. Orina de color oscuro o sangre en la orina. Latidos cardacos acelerados. Estos sntomas pueden indicar una emergencia. Solicite ayuda de inmediato. Llame al 911. No espere a ver si los sntomas desaparecen. No conduzca por sus propios medios hasta el hospital. Resumen Los moretones y el dolor alrededor del lugar de la insercin de la va intravenosa (IV) son frecuentes. Controle el lugar de insercin de la va intravenosa (IV) todos los das para descartar signos de infeccin. Haga reposo como se lo haya indicado el mdico. Retome sus actividades normales como se lo haya indicado el mdico. Busque ayuda de inmediato si tiene sntomas de una reaccin alrgica grave o de una reaccin del sistema inmunitario a una transfusin de sangre. Esta informacin no tiene como fin reemplazar el consejo del mdico. Asegrese de hacerle al mdico cualquier pregunta que tenga. Document Revised: 05/06/2021 Document Reviewed: 05/06/2021 Elsevier Patient Education  2023 Elsevier Inc.   

## 2022-05-16 NOTE — Patient Instructions (Addendum)
HUMALOG:Para niveles de azcar en la sangre, 0-150 administre 0 unidades de [place], 151-200 administre 2 unidades de [place], 201-250 administre 4 unidades, 251-300 administre 6 unidades, 301-350 administre 8 unidades, 351-400 administre 10 unidades,> 400 dan 12 unidades

## 2022-05-17 ENCOUNTER — Telehealth: Payer: Self-pay | Admitting: Hematology

## 2022-05-17 ENCOUNTER — Other Ambulatory Visit: Payer: Self-pay

## 2022-05-17 ENCOUNTER — Inpatient Hospital Stay: Payer: Medicare Other

## 2022-05-17 LAB — PREPARE PLATELET PHERESIS: Unit division: 0

## 2022-05-17 LAB — BPAM PLATELET PHERESIS
Blood Product Expiration Date: 202404242359
Unit Type and Rh: 6200

## 2022-05-17 MED ORDER — BASAGLAR KWIKPEN 100 UNIT/ML ~~LOC~~ SOPN: 20.0000 [IU] | PEN_INJECTOR | Freq: Two times a day (BID) | SUBCUTANEOUS | 2 refills | Status: DC

## 2022-05-17 NOTE — Telephone Encounter (Signed)
Rx sent for Basaglar instead.

## 2022-05-17 NOTE — Telephone Encounter (Signed)
Contacted patient to scheduled appointments. Left message with appointment details and a call back number if patient had any questions or could not accommodate the time we provided.   

## 2022-05-17 NOTE — Telephone Encounter (Signed)
Pt is calling in because the pharmacy informed her that her insurance won't cover the [facility], YFGN, 100 UNIT/ML Pen [[id number]] . Per pharmacist the insurance will cover [place] or [place], but not the [place]. Pharmacist is a requesting a new prescription for either of those medications.

## 2022-05-17 NOTE — Progress Notes (Signed)
Arcelia Jew, RN  Wiley, Amanda Primer, RN; Amanda Croissant, MD; Amanda Mood, MD; Caesar Chestnut, RN; Lynda Rainwater; 2 others Cc: Amanda Duarte, CMA Dr. Pamelia Hoit, I spoke with Amanda Duarte in Flow Cytometry and they can do Monday 05/23/2022.  Infusion has confirmed a bedroom for Monday.  Chelsea, would you please call pt to get her scheduled for a BM Biopsy on Monday.  Pt is currently scheduled for 05/31/2022 but Dr. Mosetta Putt needed the [facility] Bx to be sooner.  Please let pt know we are moving her BM Bx up.  It will need to be scheduled at 7:30 am for 90 mins infusion time.  Amanda Duarte, please cancel the BM Bx scheduled for this pt on 05/31/2022.  Dr. Mosetta Putt is having it done in the Infusion Clinic on 05/23/2022.  Thanks ladies for all your hard work with getting this originally scheduled!  We appreciate everything you do for our patients!  Dr. Mosetta Putt, everything is now set in motion.     Thanks,  Amanda Duarte       Previous Messages    ----- Message ----- From: Dorann Lodge, RN Sent: 05/17/2022  10:51 AM EDT To: Amanda Duarte, CMA; Amanda Croissant, MD; *  Good morning,  We can add bone marrow in infusion on Monday morning.  Thank you, Lowella Bandy  ----- Message ----- From: Arcelia Jew, RN Sent: 05/16/2022   4:38 PM EDT To: Amanda Duarte, CMA; Amanda Croissant, MD; *  Good afternoon Dr. Pamelia Hoit,  Thanks for helping our patient but I will need to check with the Infusion CN for a bed Wednesday and with flow Cytometry to see if they can come as well.  I will follow-up with you tomorrow on the outcome.  [facility] Denny Peon, is it possible to get one of the bed rooms in infusion on Wednesday morning for a BM Biopsy?  I have to check with flow to see if they have availability as well but I will keep you posted on the outcome.  Thanks,  Amanda Duarte ----- Message ----- From: Amanda Croissant, MD Sent: 05/16/2022   4:07 PM EDT To: Amanda Mood, MD; Arcelia Jew, RN  I can do it tomorrow. If not, I can do  Monday next week I am off Thu and Friday Let me know Vinay ----- Message ----- From: Amanda Mood, MD Sent: 05/16/2022  11:13 AM EDT To: Amanda Croissant, MD; Arcelia Jew, RN  [employer],  This pt is scheduled for BM biopsy with IR on 5/7, but I need it to be done sooner due to her worsening thrombocytopenia. Are you available for BM this week? Let me know your availability and we will coordinate with path lab to get it scheduled.  Amanda Duarte, please call Amil Amen when it's scheduled, she will inform pt  Thanks  Terrace Arabia

## 2022-05-18 ENCOUNTER — Inpatient Hospital Stay: Payer: Medicare Other

## 2022-05-19 ENCOUNTER — Inpatient Hospital Stay: Payer: Medicare Other

## 2022-05-19 ENCOUNTER — Other Ambulatory Visit: Payer: Self-pay

## 2022-05-19 ENCOUNTER — Other Ambulatory Visit: Payer: Self-pay | Admitting: Family Medicine

## 2022-05-19 DIAGNOSIS — C50212 Malignant neoplasm of upper-inner quadrant of left female breast: Secondary | ICD-10-CM

## 2022-05-19 DIAGNOSIS — D46Z Other myelodysplastic syndromes: Secondary | ICD-10-CM

## 2022-05-19 DIAGNOSIS — D469 Myelodysplastic syndrome, unspecified: Secondary | ICD-10-CM | POA: Diagnosis not present

## 2022-05-19 DIAGNOSIS — D649 Anemia, unspecified: Secondary | ICD-10-CM

## 2022-05-19 LAB — CBC WITH DIFFERENTIAL (CANCER CENTER ONLY)
Abs Immature Granulocytes: 0 10*3/uL (ref 0.00–0.07)
Basophils Absolute: 0.1 10*3/uL (ref 0.0–0.1)
Basophils Relative: 3 %
Eosinophils Absolute: 0.3 10*3/uL (ref 0.0–0.5)
Eosinophils Relative: 14 %
HCT: 25.8 % — ABNORMAL LOW (ref 36.0–46.0)
Hemoglobin: 9.1 g/dL — ABNORMAL LOW (ref 12.0–15.0)
Immature Granulocytes: 0 %
Lymphocytes Relative: 65 %
Lymphs Abs: 1.3 10*3/uL (ref 0.7–4.0)
MCH: 31.1 pg (ref 26.0–34.0)
MCHC: 35.3 g/dL (ref 30.0–36.0)
MCV: 88.1 fL (ref 80.0–100.0)
Monocytes Absolute: 0.2 10*3/uL (ref 0.1–1.0)
Monocytes Relative: 7 %
Neutro Abs: 0.2 10*3/uL — CL (ref 1.7–7.7)
Neutrophils Relative %: 11 %
Platelet Count: 33 10*3/uL — ABNORMAL LOW (ref 150–400)
RBC: 2.93 MIL/uL — ABNORMAL LOW (ref 3.87–5.11)
RDW: 16.3 % — ABNORMAL HIGH (ref 11.5–15.5)
WBC Count: 2.1 10*3/uL — ABNORMAL LOW (ref 4.0–10.5)
nRBC: 0 % (ref 0.0–0.2)

## 2022-05-19 LAB — CMP (CANCER CENTER ONLY)
ALT: 11 U/L (ref 0–44)
AST: 15 U/L (ref 15–41)
Albumin: 4.1 g/dL (ref 3.5–5.0)
Alkaline Phosphatase: 209 U/L — ABNORMAL HIGH (ref 38–126)
Anion gap: 6 (ref 5–15)
BUN: 17 mg/dL (ref 8–23)
CO2: 27 mmol/L (ref 22–32)
Calcium: 9.8 mg/dL (ref 8.9–10.3)
Chloride: 98 mmol/L (ref 98–111)
Creatinine: 0.66 mg/dL (ref 0.44–1.00)
GFR, Estimated: >60 mL/min (ref >60)
Glucose, Bld: 426 mg/dL — ABNORMAL HIGH (ref 70–99)
Potassium: 3.9 mmol/L (ref 3.5–5.1)
Sodium: 131 mmol/L — ABNORMAL LOW (ref 135–145)
Total Bilirubin: 0.7 mg/dL (ref 0.3–1.2)
Total Protein: 8.4 g/dL — ABNORMAL HIGH (ref 6.5–8.1)

## 2022-05-19 LAB — SAMPLE TO BLOOD BANK

## 2022-05-19 NOTE — Progress Notes (Signed)
Per Dr. Mosetta Putt, OK to discharge with Hgb 9.3, platelets 33, without transfusion.  Patient ambulated to lobby at discharge.

## 2022-05-20 ENCOUNTER — Inpatient Hospital Stay: Payer: Medicare Other

## 2022-05-23 ENCOUNTER — Inpatient Hospital Stay: Payer: Medicare Other

## 2022-05-23 ENCOUNTER — Inpatient Hospital Stay (HOSPITAL_BASED_OUTPATIENT_CLINIC_OR_DEPARTMENT_OTHER): Payer: Medicare Other | Admitting: Hematology and Oncology

## 2022-05-23 ENCOUNTER — Other Ambulatory Visit: Payer: Self-pay

## 2022-05-23 ENCOUNTER — Other Ambulatory Visit: Payer: Self-pay | Admitting: Hematology and Oncology

## 2022-05-23 DIAGNOSIS — D469 Myelodysplastic syndrome, unspecified: Secondary | ICD-10-CM | POA: Diagnosis not present

## 2022-05-23 DIAGNOSIS — D46Z Other myelodysplastic syndromes: Secondary | ICD-10-CM

## 2022-05-23 DIAGNOSIS — D649 Anemia, unspecified: Secondary | ICD-10-CM

## 2022-05-23 DIAGNOSIS — Z170 Estrogen receptor positive status [ER+]: Secondary | ICD-10-CM

## 2022-05-23 LAB — CMP (CANCER CENTER ONLY)
ALT: 13 U/L (ref 0–44)
AST: 16 U/L (ref 15–41)
Albumin: 3.6 g/dL (ref 3.5–5.0)
Alkaline Phosphatase: 200 U/L — ABNORMAL HIGH (ref 38–126)
Anion gap: 7 (ref 5–15)
BUN: 16 mg/dL (ref 8–23)
CO2: 26 mmol/L (ref 22–32)
Calcium: 9.2 mg/dL (ref 8.9–10.3)
Chloride: 101 mmol/L (ref 98–111)
Creatinine: 0.62 mg/dL (ref 0.44–1.00)
GFR, Estimated: >60 mL/min (ref >60)
Glucose, Bld: 402 mg/dL — ABNORMAL HIGH (ref 70–99)
Potassium: 3.9 mmol/L (ref 3.5–5.1)
Sodium: 134 mmol/L — ABNORMAL LOW (ref 135–145)
Total Bilirubin: 0.6 mg/dL (ref 0.3–1.2)
Total Protein: 7.4 g/dL (ref 6.5–8.1)

## 2022-05-23 LAB — CBC WITH DIFFERENTIAL (CANCER CENTER ONLY)
Abs Immature Granulocytes: 0 10*3/uL (ref 0.00–0.07)
Basophils Absolute: 0 10*3/uL (ref 0.0–0.1)
Basophils Relative: 1 %
Eosinophils Absolute: 0.3 10*3/uL (ref 0.0–0.5)
Eosinophils Relative: 17 %
HCT: 21.3 % — ABNORMAL LOW (ref 36.0–46.0)
Hemoglobin: 7.7 g/dL — ABNORMAL LOW (ref 12.0–15.0)
Immature Granulocytes: 0 %
Lymphocytes Relative: 60 %
Lymphs Abs: 1 10*3/uL (ref 0.7–4.0)
MCH: 31.4 pg (ref 26.0–34.0)
MCHC: 36.2 g/dL — ABNORMAL HIGH (ref 30.0–36.0)
MCV: 86.9 fL (ref 80.0–100.0)
Monocytes Absolute: 0.1 10*3/uL (ref 0.1–1.0)
Monocytes Relative: 7 %
Neutro Abs: 0.3 10*3/uL — CL (ref 1.7–7.7)
Neutrophils Relative %: 15 %
Platelet Count: 37 10*3/uL — ABNORMAL LOW (ref 150–400)
RBC: 2.45 MIL/uL — ABNORMAL LOW (ref 3.87–5.11)
RDW: 16.8 % — ABNORMAL HIGH (ref 11.5–15.5)
Smear Review: NORMAL
WBC Count: 1.8 10*3/uL — ABNORMAL LOW (ref 4.0–10.5)
nRBC: 0 % (ref 0.0–0.2)

## 2022-05-23 LAB — SAMPLE TO BLOOD BANK

## 2022-05-23 NOTE — Progress Notes (Signed)
INDICATION: MDS and history of breast cancer    Bone Marrow Biopsy and Aspiration Procedure Note   Informed consent was obtained and potential risks including bleeding, infection and pain were reviewed with the patient.  The patient's name, date of birth, identification, consent and allergies were verified prior to the start of procedure and time out was performed.  The left posterior iliac crest was chosen as the site of biopsy.  The skin was prepped with ChloraPrep.   8 cc of 1% lidocaine was used to provide local anaesthesia.   10 cc of bone marrow aspirate was obtained followed by 1cm biopsy.  Pressure was applied to the biopsy site and bandage was placed over the biopsy site. Patient was made to lie on the back for 15 mins prior to discharge.  The procedure was tolerated well. COMPLICATIONS: None BLOOD LOSS: none The patient was discharged home in stable condition with a 1 week follow up to review results.  Patient was provided with post bone marrow biopsy instructions and instructed to call if there was any bleeding or worsening pain. The aspirate was scant and spicules.  So we got a second sample which was also scant and spicules.  Specimens sent for flow cytometry, cytogenetics and additional studies.  Signed Tamsen Meek, MD

## 2022-05-23 NOTE — Patient Instructions (Signed)
Aspiracin y biopsia de mdula sea en adultos, cuidados posteriores Bone Marrow Aspiration and Bone Marrow Biopsy, Adult, Care After La siguiente informacin ofrece orientacin sobre cmo cuidarse despus del procedimiento. El mdico tambin podr darle instrucciones ms especficas. Comunquese con el mdico si tiene problemas o preguntas. Qu puedo esperar despus del procedimiento? Despus del procedimiento, es normal tener los siguientes sntomas: Dolor leve y sensibilidad. Hinchazn. Moretones. Siga estas indicaciones en su casa: Cuidado de la incisin  Siga las indicaciones del mdico acerca del cuidado del lugar de la incisin. Asegrese de hacer lo siguiente: Lvese las manos con agua y jabn durante al menos 20 segundos antes y despus de cambiarse la venda (vendaje). Use desinfectante para manos si no dispone de [place] y [place]. Cambie el vendaje como se lo haya indicado el mdico. No retire los puntos (suturas), la goma para cerrar la piel o las tiras [place]. Es posible que estos cierres cutneos deban quedar puestos en la piel durante 2 semanas o ms tiempo. Si los bordes de las tiras [address] empiezan a despegarse y Scientific laboratory technician, puede recortar los que estn sueltos. No retire las tiras [employer] por completo a menos que el mdico se lo indique. Haematologist de la incisin todos los [address]  [address] para detectar signos de infeccin. Est atento a los siguientes signos: Aumento del enrojecimiento, la hinchazn o Chief Technology Officer. Lquido o sangre. Calor. Pus o mal olor. Actividad Retome sus actividades normales como se lo haya indicado el mdico. Pregntele al mdico qu actividades son seguras para usted. No levante ningn objeto que pese ms de 10 libras (4.5 kg) o que supere el lmite de peso que le hayan indicado, Teacher, adult education que el mdico le diga que puede [place]. Si le administraron un sedante durante el procedimiento, puede afectarlo por varias horas. No conduzca ni opere maquinaria hasta que  el mdico le indique que es seguro [place]. Indicaciones generales  Use los medicamentos de venta libre y los recetados solamente como se lo haya indicado el mdico. No tome baos de inmersin, no nade ni use el jacuzzi hasta que el mdico lo autorice. Pregntele al mdico si puede ducharse. Delle Reining solo le permitan darse baos de [place]. Si se lo indican, aplique hielo sobre la zona afectada. Para hacer esto: Ponga el hielo en una bolsa plstica. Coloque una toalla entre la piel y Copy. Aplique el hielo durante 20 minutos, 2 a 3 veces por da. Si la piel se le pone de color rojo brillante, retire el hielo de inmediato para evitar daos en la piel. El riesgo de dao en la piel es mayor si no puede sentir dolor, calor o fro. Comunquese con un mdico si: Tiene signos de infeccin. El dolor no se alivia con los [employer]. Tiene cncer y tiene una fiebre de 100.4 F (38 C) o ms. Solicite ayuda de inmediato si: Tiene fiebre de 101 F (38.3 C) o ms, o segn se lo haya indicado el mdico. Tiene una hemorragia que no puede Advertising account executive de la incisin. Esta informacin no tiene Theme park manager el consejo del mdico. Asegrese de hacerle al mdico cualquier pregunta que tenga. Document Revised: 06/08/2021 Document Reviewed: 06/08/2021 Elsevier Patient Education  2023 [employer].

## 2022-05-23 NOTE — Progress Notes (Signed)
Patient presented for scheduled Bone Marrow Biopsy. VS obtained, patient education provided via interpreter at bedside. Consent signed. Amanda Hoit, MD at bedside to perform biopsy. Post procedure VS consistent with previous. CBC obtained. Pt states she "feels fine". Biopsy dressing visualized as clean dry intact at time of discharge. AVS reviewed. Pt ambulated independently to lobby for discharge.

## 2022-05-25 LAB — SURGICAL PATHOLOGY

## 2022-05-26 ENCOUNTER — Encounter: Payer: Self-pay | Admitting: Hematology

## 2022-05-26 ENCOUNTER — Other Ambulatory Visit: Payer: Self-pay

## 2022-05-26 ENCOUNTER — Inpatient Hospital Stay (HOSPITAL_BASED_OUTPATIENT_CLINIC_OR_DEPARTMENT_OTHER): Payer: Medicare Other | Admitting: Hematology

## 2022-05-26 ENCOUNTER — Inpatient Hospital Stay: Payer: Medicare Other

## 2022-05-26 ENCOUNTER — Inpatient Hospital Stay: Payer: Medicare Other | Attending: Hematology

## 2022-05-26 VITALS — BP 128/43 | HR 75 | Temp 98.5°F | Resp 18 | Ht 60.0 in | Wt 120.7 lb

## 2022-05-26 DIAGNOSIS — C50212 Malignant neoplasm of upper-inner quadrant of left female breast: Secondary | ICD-10-CM

## 2022-05-26 DIAGNOSIS — Z170 Estrogen receptor positive status [ER+]: Secondary | ICD-10-CM

## 2022-05-26 DIAGNOSIS — D46Z Other myelodysplastic syndromes: Secondary | ICD-10-CM

## 2022-05-26 DIAGNOSIS — D649 Anemia, unspecified: Secondary | ICD-10-CM

## 2022-05-26 DIAGNOSIS — D469 Myelodysplastic syndrome, unspecified: Secondary | ICD-10-CM | POA: Diagnosis present

## 2022-05-26 DIAGNOSIS — Z5111 Encounter for antineoplastic chemotherapy: Secondary | ICD-10-CM | POA: Insufficient documentation

## 2022-05-26 DIAGNOSIS — Z79899 Other long term (current) drug therapy: Secondary | ICD-10-CM | POA: Insufficient documentation

## 2022-05-26 LAB — BPAM RBC
Blood Product Expiration Date: 202405292359
Unit Type and Rh: 5100

## 2022-05-26 LAB — CBC WITH DIFFERENTIAL (CANCER CENTER ONLY)
Abs Immature Granulocytes: 0 10*3/uL (ref 0.00–0.07)
Basophils Absolute: 0 10*3/uL (ref 0.0–0.1)
Basophils Relative: 2 %
Blasts: 6 %
Eosinophils Absolute: 0.2 10*3/uL (ref 0.0–0.5)
Eosinophils Relative: 15 %
HCT: 22.8 % — ABNORMAL LOW (ref 36.0–46.0)
Hemoglobin: 7.9 g/dL — ABNORMAL LOW (ref 12.0–15.0)
Lymphocytes Relative: 65 %
Lymphs Abs: 1 10*3/uL (ref 0.7–4.0)
MCH: 30.9 pg (ref 26.0–34.0)
MCHC: 34.6 g/dL (ref 30.0–36.0)
MCV: 89.1 fL (ref 80.0–100.0)
Monocytes Absolute: 0 10*3/uL — ABNORMAL LOW (ref 0.1–1.0)
Monocytes Relative: 1 %
Neutro Abs: 0.2 10*3/uL — CL (ref 1.7–7.7)
Neutrophils Relative %: 11 %
Platelet Count: 48 10*3/uL — ABNORMAL LOW (ref 150–400)
RBC: 2.56 MIL/uL — ABNORMAL LOW (ref 3.87–5.11)
RDW: 17.1 % — ABNORMAL HIGH (ref 11.5–15.5)
WBC Count: 1.6 10*3/uL — ABNORMAL LOW (ref 4.0–10.5)
nRBC: 0 % (ref 0.0–0.2)

## 2022-05-26 LAB — CMP (CANCER CENTER ONLY)
ALT: 12 U/L (ref 0–44)
AST: 15 U/L (ref 15–41)
Albumin: 3.8 g/dL (ref 3.5–5.0)
Alkaline Phosphatase: 179 U/L — ABNORMAL HIGH (ref 38–126)
Anion gap: 7 (ref 5–15)
BUN: 16 mg/dL (ref 8–23)
CO2: 27 mmol/L (ref 22–32)
Calcium: 8.9 mg/dL (ref 8.9–10.3)
Chloride: 98 mmol/L (ref 98–111)
Creatinine: 0.67 mg/dL (ref 0.44–1.00)
GFR, Estimated: >60 mL/min (ref >60)
Glucose, Bld: 491 mg/dL — ABNORMAL HIGH (ref 70–99)
Potassium: 4.1 mmol/L (ref 3.5–5.1)
Sodium: 132 mmol/L — ABNORMAL LOW (ref 135–145)
Total Bilirubin: 0.7 mg/dL (ref 0.3–1.2)
Total Protein: 7.9 g/dL (ref 6.5–8.1)

## 2022-05-26 LAB — TYPE AND SCREEN: Antibody Screen: NEGATIVE

## 2022-05-26 LAB — PREPARE RBC (CROSSMATCH)

## 2022-05-26 LAB — SAMPLE TO BLOOD BANK

## 2022-05-26 MED ORDER — DIPHENHYDRAMINE HCL 25 MG PO CAPS
25.0000 mg | ORAL_CAPSULE | Freq: Once | ORAL | Status: AC
  Administered 2022-05-26: 25 mg via ORAL
  Filled 2022-05-26: qty 1

## 2022-05-26 MED ORDER — SODIUM CHLORIDE 0.9% IV SOLUTION
250.0000 mL | Freq: Once | INTRAVENOUS | Status: AC
  Administered 2022-05-26: 250 mL via INTRAVENOUS

## 2022-05-26 MED ORDER — ACETAMINOPHEN 325 MG PO TABS
650.0000 mg | ORAL_TABLET | Freq: Once | ORAL | Status: AC
  Administered 2022-05-26: 650 mg via ORAL
  Filled 2022-05-26: qty 2

## 2022-05-26 MED ORDER — LEVOFLOXACIN 500 MG PO TABS: 500.0000 mg | ORAL_TABLET | Freq: Every day | ORAL | 1 refills | Status: DC

## 2022-05-26 NOTE — Progress Notes (Signed)
[facility]   Telephone:(336) [phone] Fax:(336) [phone]   Clinic Follow up Note   Patient Care Team: Malachy Mood, MD as PCP - General (Hematology) Malachy Mood, MD as Consulting Physician (Hematology) Claud Kelp, MD as Consulting Physician (General Surgery) Pollyann Samples, NP as Nurse Practitioner (Nurse Practitioner) Antony Blackbird, MD as Consulting Physician (Radiation Oncology) Earl Lites, MD as Referring Physician (Hematology and Oncology) Riley Churches, RN as [facility] Care Management Rosaria Ferries, Orpah Clinton, MD as Referring Physician (Hematology and Oncology)  Date of Service:  05/26/2022  CHIEF COMPLAINT: f/u of MDS  CURRENT THERAPY:  Azacitadine injection, days 1-5, started on 11/15/21  (first cycle 7 days); C2 x4 days, subsequent cycles daily x5 days  Platelet transfusion if platelet count less than 20 K Blood transfusion if hemoglobin less than 8.0  ASSESSMENT:  LOIDA CALAMIA is a 67 y.o. 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  Her FISH test showed 3 abnormal type, her risk of transforming to acute leukemia is very high, likely in the next 6 to 12 months -She started azacitidine injection on November 15, 2021, she was admitted to hospital after chemo, for severe profound pancytopenia, aspiration pneumonia, required intubation. She has recovered well  -Her blood counts improved after first cycle chemotherapy, likely she responded to treatment. -she started cycle 2 azacitidine on 01/25/2022, will reduce to 4 days for this cycle. If she tolerates well, will change to 5 days from next cyle  -She was evaluated for bone marrow transplant at [facility] and is open to transplant. We recommend continuing chemo for now, and reserve  transplant when she progress. She is tolerating Azacitadine well.   -She has been more cytopenic lately, concerning for disease progression.  I held her chemo last week. -Repeated a bone marrow biopsy on May 23, 2022 showed persistent high-grade MDS, with increased blasts 6%, no evidence of AML transformation at this point  -She is clinically stable, lab reviewed, still has severe pancytopenia.  Plan to resume chemotherapy in deciding with dose reduction next Monday.  Pancytopenia -Secondary to #1 -I refilled her Levaquin, to reduce her risk of infection -Continue lab and blood transfusion twice a week as needed  PLAN: -I reviewed her bone marrow biopsy results -Resume azacitidine next Monday, with dose reduction to 60 mg/m due to her worsening pancytopenia.   SUMMARY OF ONCOLOGIC HISTORY: Oncology History Overview Note  Cancer Staging Malignant neoplasm of upper-inner quadrant of left breast in female, estrogen receptor positive (HCC) Staging form: Breast, AJCC 8th Edition - Clinical stage from 05/01/2017: Stage Unknown (cTX, cN2, cM0, G3, ER-, PR-, HER2-) - Signed by Malachy Mood, MD on 11/01/2017 - Pathologic stage from 11/20/2017: No Stage Recommended (ypT0, pN57mi, cM0, GX, ER-, PR-, HER2-) - Signed by Malachy Mood, MD on 12/10/2017     Malignant neoplasm of upper-inner quadrant of left breast in female, estrogen receptor negative (HCC)  04/28/2017 Mammogram   IMPRESSION: Two adjacent masses/enlarged lymph nodes in the LOWER LEFT axilla, the largest measuring 2.1 cm. Tissue sampling of 1 of these is recommended to exclude malignancy/lymphoma. No mammographic evidence of breast malignancy bilaterally.    05/01/2017 Initial Biopsy   Diagnosis 05/01/17 Lymph node, needle/core biopsy, low left inferior axillary - METASTATIC POORLY DIFFERENTIATED CARCINOMA TO A LYMPH NODE. SEE NOTE.  05/01/2017 Cancer Staging   Staging form: Breast, AJCC 8th Edition - Clinical stage from 05/01/2017: Stage  Unknown (cTX, cN1, cM0, G3, ER-, PR-, HER2-) - Signed by Malachy Mood, MD on 06/02/2017    05/01/2017 Receptors her2   Lymph Node Biopsy:  HER2-Negative  PR-Negative  ER- Negative    05/10/2017 Imaging   MR Breast W WO Contrast 05/10/17 IMPRESSION: No MRI evidence of malignancy in the right breast. Area of week stippled non mass enhancement in the left breast upper inner quadrant. Separate area of thin linear non mass enhancement in the subareolar left breast. Three grossly abnormal left axillary lymph nodes, and more than 4 less than 1 cm indeterminate left axillary lymph nodes. No evidence of right axillary lymphadenopathy.   05/17/2017 Initial Biopsy   Diagnosis 05/17/17 1. 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 2. Breast, left, needle core biopsy, upper inner post MR enhancement - MICROSCOPIC FOCUS OF DUCTAL CARCINOMA IN SITU.   05/17/2017 Receptors her2   Left Breast Biopsy:  ER-Negative PR-Negative HER2-Negative   05/22/2017 Initial Diagnosis   Malignant neoplasm of upper-inner quadrant of left breast in female, estrogen receptor positive (HCC)   06/01/2017 Imaging   Boen Scan 06/01/17 IMPRESSION: No definite scintigraphic evidence of osseous metastatic disease.   Posttraumatic and postsurgical uptake at the LEFT knee.   Nonspecific soft tissue distribution of tracer at the LEFT thigh, could represent contusion, hemorrhage, soft tissue edema, or soft tissue calcifications such as from heterotopic calcification and myositis ossificans; recommend clinical correlation and consider dedicated LEFT femoral radiographs.   Single focus of nonspecific increased tracer localization at the lateral LEFT orbit.     06/01/2017 Imaging   06/01/2017 Bone Scan IMPRESSION: No definite scintigraphic evidence of osseous metastatic disease.   Posttraumatic and postsurgical uptake at the LEFT knee.    Nonspecific soft tissue distribution of tracer at the LEFT thigh, could represent contusion, hemorrhage, soft tissue edema, or soft tissue calcifications such as from heterotopic calcification and myositis ossificans; recommend clinical correlation and consider dedicated LEFT femoral radiographs.   Single focus of nonspecific increased tracer localization at the lateral LEFT orbit.   06/09/2017 -  Chemotherapy   ddAC every 2 weeks for 4 weeks starting 06/09/17-07/21/17 followed by weekly [place] and taxol for 12 weeks 08/04/17-10/20/17.    10/24/2017 Imaging   Breast MRI B/l 10/24/17 IMPRESSION: 1. No residual enhancement in the LEFT breast following neoadjuvant treatment. 2. Significantly smaller LEFT axillary lymph nodes, largest now measuring 1.4 centimeters. The remainder of LEFT axillary lymph nodes demonstrate normal fatty hila. RECOMMENDATION: Treatment plan for known LEFT breast cancer.   11/20/2017 Cancer Staging   Staging form: Breast, AJCC 8th Edition - Pathologic stage from 11/20/2017: No Stage Recommended (ypT0, pN88mi, cM0, GX, ER-, PR-, HER2-) - Signed by Malachy Mood, MD on 12/10/2017   11/20/2017 Surgery   Left mastectomy and SLN biopsy by Dr. Derrell Lolling    11/20/2017 Pathology Results   Breast, modified radical mastectomy , left - MICROSCOPIC FOCI OF RESIDUAL METASTATIC CARCINOMA INVOLVING TWO OF EIGHT LYMPH NODES (2/8); LARGEST CONTINUOUS FOCUS MEASURES 0.1 CM. - NO EVIDENCE OF RESIDUAL CARCINOMA IN THE MASTECTOMY SPECIMEN - MARKED THERAPY-RELATED CHANGES, INCLUDING DENSE HYALIN FIBROSIS     11/20/2017 Receptors her2   ER- PR- HER2- (IHC 1+)   01/02/2018 - 02/14/2018 Radiation Therapy   Adjuvant Radiation 01/02/18 - 02/14/18   07/31/2018 Imaging   Baseline DEXA 07/31/18  ASSESSMENT: The BMD measured at [facility] Sp[facility] L1-L4 is 0.973 g/cm2 with a T-score of -1.7.   This patient is considered OSTEOPENIC according to World Health Organization [facility]) criteria. The scan  quality is good.   Site Region Measured Date Measured Age YA T-score BMD Significant CHANGE   AP Spine  L1-L4      07/31/2018    63.1         -1.7    0.973 g/cm2   DualFemur Neck Left  07/31/2018    63.1         -1.5    0.828 g/cm2   DualFemur Total Mean 07/31/2018    63.1         0.1     1.016 g/cm2   11/05/2018 Survivorship   Per Santiago Glad, NP    11/15/2021 -  Chemotherapy   Patient is on Treatment Plan : MYELODYSPLASIA  Azacitidine SQ D1-7 q28d     02/03/2022 Imaging    IMPRESSION: No active cardiopulmonary disease. No evidence of pneumonia or pulmonary edema. Possible mild chronic interstitial lung disease.      INTERVAL HISTORY:  TAKENYA TRAVAGLINI is here for a follow up of MDS. She was last seen by me on 05/16/2022. She presents to the clinic accompanied by her interpreter Amil Amen.  She underwent a bone marrow biopsy earlier this week, and tolerated procedure well.  She denies any fever, bleeding, or other new symptoms.  She is functioning well at home.  All other systems were reviewed with the patient and are negative.  MEDICAL HISTORY:  Past Medical History:  Diagnosis Date   Cancer (HCC) 03/2017   left breast cancer   Diabetes mellitus without complication (HCC)    Hyperlipidemia    Hypertension    Malignant neoplasm of left breast (HCC)    Stroke (cerebrum) (HCC)    Tibial plateau fracture, left    04-13-17 had ORIF    SURGICAL HISTORY: Past Surgical History:  Procedure Laterality Date   MASTECTOMY Left 2019   MASTECTOMY MODIFIED RADICAL Left 11/20/2017   Procedure: LEFT MODIFIED RADICAL MASTECTOMY;  Surgeon: Claud Kelp, MD;  [facility] OR;  Service: General;  Laterality: Left;   ORIF TIBIA PLATEAU Left 04/13/2017   Procedure: OPEN REDUCTION INTERNAL FIXATION (ORIF) TIBIAL PLATEAU;  Surgeon: Roby Lofts, MD;  Location: [facility] OR;  Service: Orthopedics;  Laterality: Left;   PORT-A-CATH REMOVAL Right 11/20/2017   Procedure: REMOVAL PORT-A-CATH;  Surgeon:  Claud Kelp, MD;  Location: [facility] OR;  Service: General;  Laterality: Right;   PORTACATH PLACEMENT Right 05/31/2017   Procedure: INSERTION PORT-A-CATH;  Surgeon: Claud Kelp, MD;  Location: [facility];  Service: General;  Laterality: Right;    I have reviewed the social history and family history with the patient and they are unchanged from previous note.  ALLERGIES:  is allergic to victoza [liraglutide].  MEDICATIONS:  Current Outpatient Medications  Medication Sig Dispense Refill   amLODipine (NORVASC) 5 MG tablet Take 1 tablet (5 mg total) by mouth daily. For blood pressure 90 tablet 1   atorvastatin (LIPITOR) 40 MG tablet Take 1 tablet (40 mg total) by mouth daily. For cholesterol 90 tablet 2   Blood Pressure Monitor DEVI Please provide patient with insurance approved blood pressure monitor 1 each 0   citalopram (CELEXA) 20 MG tablet Take 1 tablet (20 mg total) by mouth daily. For depression 90 tablet 3   Continuous Glucose Receiver (FREESTYLE LIBRE READER) DEVI Monitor blood glucose levels  5-6 times per day E11.65 1 each 0   Continuous Glucose Sensor (FREESTYLE LIBRE 2 SENSOR) MISC Monitor blood glucose levels 5-6 times per day E11.65 3 each 6   fenofibrate (TRICOR) 145 MG tablet Take 1 tablet (145 mg total) by mouth daily. For cholesterol 90 tablet 2   fluticasone (FLONASE) 50 MCG/ACT nasal spray Place 2 sprays into both nostrils daily. 16 g 6   HUMALOG KWIKPEN 100 UNIT/ML KwikPen Inject 10 Units into the skin 3 (three) times daily. 15 mL 1   Insulin Glargine (BASAGLAR KWIKPEN) 100 UNIT/ML Inject 20 Units into the skin 2 (two) times daily. 15 mL 2   Insulin Pen Needle 31G X 5 MM MISC Inject 1 Device into the skin QID. For use with insulin pens 200 each 6   Insulin Syringe-Needle U-100 30G X 5/16" 1 ML MISC Use as directed to inject into the skin 3 times daily. 200 each 6   levofloxacin (LEVAQUIN) 500 MG tablet Take 1 tablet (500 mg total) by mouth daily. 30 tablet 1    losartan (COZAAR) 50 MG tablet Take 1 tablet (50 mg total) by mouth daily. 90 tablet 1   ondansetron (ZOFRAN) 8 MG tablet Take 1 tablet (8 mg total) by mouth every 8 (eight) hours as needed for nausea or vomiting. 30 tablet 1   pantoprazole (PROTONIX) 40 MG tablet Take 1 tablet (40 mg total) by mouth daily at 6 (six) AM. For heartburn 90 tablet 1   potassium chloride SA (KLOR-CON M) 20 MEQ tablet Take 1 tablet (20 mEq total) by mouth daily. 30 tablet 3   prochlorperazine (COMPAZINE) 10 MG tablet Take 1 tablet (10 mg total) by mouth every 6 (six) hours as needed for nausea or vomiting. 30 tablet 1   No current facility-administered medications for this visit.   Facility-Administered Medications Ordered in Other Visits  Medication Dose Route Frequency Provider Last Rate Last Admin   heparin lock flush 100 unit/mL  500 Units Intracatheter Once PRN Malachy Mood, MD       sodium chloride flush (NS) 0.9 % injection 10 mL  10 mL Intracatheter PRN Malachy Mood, MD        PHYSICAL EXAMINATION: ECOG PERFORMANCE STATUS: 1 - Symptomatic but completely ambulatory  Vitals:   05/26/22 1303  BP: (!) 128/43  Pulse: 75  Resp: 18  Temp: 98.5 F (36.9 C)  SpO2: 99%   Wt Readings from Last 3 Encounters:  05/26/22 120 lb 11.2 oz (54.7 kg)  05/16/22 120 lb 9.6 oz (54.7 kg)  05/16/22 122 lb 3.2 oz (55.4 kg)     GENERAL:alert, no distress and comfortable SKIN: skin color, texture, turgor are normal, no rashes or significant lesions EYES: normal, Conjunctiva are pink and non-injected, sclera clear NECK: supple, thyroid normal size, non-tender, without nodularity LYMPH:  no palpable lymphadenopathy in the cervical, axillary  LUNGS: clear to auscultation and percussion with normal breathing effort HEART: regular rate & rhythm and no murmurs and no lower extremity edema ABDOMEN:abdomen soft, non-tender and normal bowel sounds Musculoskeletal:no cyanosis of digits and no clubbing  NEURO: alert & oriented x 3  with fluent speech, no focal motor/sensory deficits  LABORATORY DATA:  I have reviewed the data as listed    Latest Ref Rng & Units 05/26/2022   12:02 PM 05/23/2022    8:42 AM 05/19/2022    8:13 AM  CBC  WBC 4.0 - 10.5 K/uL 1.6  1.8  2.1   Hemoglobin 12.0 - 15.0 g/dL 7.9  7.7  9.1   Hematocrit 36.0 - 46.0 % 22.8  21.3  25.8   Platelets 150 - 400 K/uL 48  37  33         Latest Ref Rng & Units 05/26/2022   12:02 PM 05/23/2022    8:47 AM 05/19/2022    8:13 AM  CMP  Glucose 70 - 99 mg/dL 213  086  578   BUN 8 - 23 mg/dL 16  16  17    Creatinine 0.44 - 1.00 mg/dL 4.69  6.29  5.28   Sodium 135 - 145 mmol/L 132  134  131   Potassium 3.5 - 5.1 mmol/L 4.1  3.9  3.9   Chloride 98 - 111 mmol/L 98  101  98   CO2 22 - 32 mmol/L 27  26  27    Calcium 8.9 - 10.3 mg/dL 8.9  9.2  9.8   Total Protein 6.5 - 8.1 g/dL 7.9  7.4  8.4   Total Bilirubin 0.3 - 1.2 mg/dL 0.7  0.6  0.7   Alkaline Phos 38 - 126 U/L 179  200  209   AST 15 - 41 U/L 15  16  15    ALT 0 - 44 U/L 12  13  11        RADIOGRAPHIC STUDIES: I have personally reviewed the radiological images as listed and agreed with the findings in the report. No results found.    Orders Placed This Encounter  Procedures   CBC with Differential (Cancer Center Only)    Standing Status:   Future    Standing Expiration Date:   05/30/2023   Basic Metabolic Panel - Cancer Center Only    Standing Status:   Future    Standing Expiration Date:   05/30/2023   CBC with Differential (Cancer Center Only)    Standing Status:   Future    Standing Expiration Date:   06/27/2023   Basic Metabolic Panel - Cancer Center Only    Standing Status:   Future    Standing Expiration Date:   06/27/2023   All questions were answered. The patient knows to call the clinic with any problems, questions or concerns. No barriers to learning was detected. The total time spent in the appointment was 30 minutes.     Malachy Mood, MD 05/26/2022

## 2022-05-26 NOTE — Assessment & Plan Note (Signed)
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  Her FISH test showed 3 abnormal type, her risk of transforming to acute leukemia is very high, likely in the next 6 to 12 months -She started azacitidine injection on November 15, 2021, she was admitted to hospital after chemo, for severe profound pancytopenia, aspiration pneumonia, required intubation. She has recovered well  -Her blood counts improved after first cycle chemotherapy, likely she responded to treatment. -she started cycle 2 azacitidine on 01/25/2022, will reduce to 4 days for this cycle. If she tolerates well, will change to 5 days from next cyle  -She was evaluated for bone marrow transplant at [facility] and is open to transplant. We recommend continuing chemo for now, and reserve transplant when she progress. She is tolerating Azacitadine well.   -She has been more cytopenic lately, concerning for disease progression. -Repeated a bone marrow biopsy on May 23, 2022 showed persistent high-grade MDS, with increased blasts 6%,

## 2022-05-27 ENCOUNTER — Encounter: Payer: Self-pay | Admitting: Hematology

## 2022-05-27 LAB — TYPE AND SCREEN
ABO/RH(D): O POS
Unit division: 0

## 2022-05-27 LAB — BPAM RBC: ISSUE DATE / TIME: 202405021453

## 2022-05-30 ENCOUNTER — Inpatient Hospital Stay: Payer: Medicare Other

## 2022-05-30 ENCOUNTER — Other Ambulatory Visit: Payer: Self-pay | Admitting: Hematology

## 2022-05-30 VITALS — BP 145/57 | HR 61 | Temp 98.2°F | Resp 16 | Wt 120.2 lb

## 2022-05-30 DIAGNOSIS — D46Z Other myelodysplastic syndromes: Secondary | ICD-10-CM

## 2022-05-30 DIAGNOSIS — Z5111 Encounter for antineoplastic chemotherapy: Secondary | ICD-10-CM | POA: Diagnosis not present

## 2022-05-30 DIAGNOSIS — Z170 Estrogen receptor positive status [ER+]: Secondary | ICD-10-CM

## 2022-05-30 DIAGNOSIS — D649 Anemia, unspecified: Secondary | ICD-10-CM

## 2022-05-30 LAB — CBC WITH DIFFERENTIAL (CANCER CENTER ONLY)
Abs Immature Granulocytes: 0 10*3/uL (ref 0.00–0.07)
Basophils Absolute: 0 10*3/uL (ref 0.0–0.1)
Basophils Relative: 1 %
Blasts: 3 %
Eosinophils Absolute: 0.2 10*3/uL (ref 0.0–0.5)
Eosinophils Relative: 13 %
HCT: 25.5 % — ABNORMAL LOW (ref 36.0–46.0)
Hemoglobin: 9.1 g/dL — ABNORMAL LOW (ref 12.0–15.0)
Lymphocytes Relative: 71 %
Lymphs Abs: 1.3 10*3/uL (ref 0.7–4.0)
MCH: 30.3 pg (ref 26.0–34.0)
MCHC: 35.7 g/dL (ref 30.0–36.0)
MCV: 85 fL (ref 80.0–100.0)
Monocytes Absolute: 0.1 10*3/uL (ref 0.1–1.0)
Monocytes Relative: 5 %
Neutro Abs: 0.1 10*3/uL — CL (ref 1.7–7.7)
Neutrophils Relative %: 7 %
Platelet Count: 36 10*3/uL — ABNORMAL LOW (ref 150–400)
RBC: 3 MIL/uL — ABNORMAL LOW (ref 3.87–5.11)
RDW: 16.9 % — ABNORMAL HIGH (ref 11.5–15.5)
WBC Count: 1.9 10*3/uL — ABNORMAL LOW (ref 4.0–10.5)
nRBC: 0 % (ref 0.0–0.2)

## 2022-05-30 LAB — BASIC METABOLIC PANEL - CANCER CENTER ONLY
Anion gap: 5 (ref 5–15)
BUN: 17 mg/dL (ref 8–23)
CO2: 27 mmol/L (ref 22–32)
Calcium: 9.1 mg/dL (ref 8.9–10.3)
Chloride: 100 mmol/L (ref 98–111)
Creatinine: 0.64 mg/dL (ref 0.44–1.00)
GFR, Estimated: >60 mL/min (ref >60)
Glucose, Bld: 345 mg/dL — ABNORMAL HIGH (ref 70–99)
Potassium: 4.1 mmol/L (ref 3.5–5.1)
Sodium: 132 mmol/L — ABNORMAL LOW (ref 135–145)

## 2022-05-30 LAB — SAMPLE TO BLOOD BANK

## 2022-05-30 MED ORDER — ONDANSETRON HCL 8 MG PO TABS
8.0000 mg | ORAL_TABLET | Freq: Once | ORAL | Status: AC
  Administered 2022-05-30: 8 mg via ORAL
  Filled 2022-05-30: qty 1

## 2022-05-30 MED ORDER — AZACITIDINE CHEMO SQ INJECTION
60.0000 mg/m2 | Freq: Once | INTRAMUSCULAR | Status: AC
  Administered 2022-05-30: 100 mg via SUBCUTANEOUS
  Filled 2022-05-30: qty 4

## 2022-05-30 NOTE — Patient Instructions (Signed)
Instrucciones al darle de alta: Discharge Instructions Gracias por elegir al [facility] de Cncer de [place] para brindarle atencin mdica de oncologa y Teacher, English as a foreign language.   Si usted tiene una cita de laboratorio con [employer] de [place], por favor vaya directamente al [employer] de Cncer y regstrese en el rea de Engineer, maintenance (IT).   Use ropa cmoda y [place] para tener fcil acceso a las vas del Portacath (acceso venoso de Set designer duracin) o la lnea PICC (catter central colocado por va perifrica).   Nos esforzamos por ofrecerle tiempo de calidad con su proveedor. Es posible que tenga que volver a programar su cita si llega tarde (15 minutos o ms).  El llegar tarde le afecta a usted y a otros pacientes cuyas citas son posteriores a Armed forces operational officer.  Adems, si usted falta a tres o ms citas sin avisar a la oficina, puede ser retirado(a) de la clnica a discrecin del proveedor.      Para las solicitudes de renovacin de recetas, pida a su farmacia que se ponga en contacto con nuestra oficina y deje que transcurran 72 horas para que se complete el proceso de las renovaciones.    Hoy usted recibi los siguientes agentes de quimioterapia e/o inmunoterapia: Azacitidine (Vidaza)      Para ayudar a prevenir las nuseas y los vmitos despus de su tratamiento, le recomendamos que tome su medicamento para las nuseas segn las indicaciones.  LOS SNTOMAS QUE DEBEN COMUNICARSE INMEDIATAMENTE SE INDICAN A CONTINUACIN: *FIEBRE SUPERIOR A 100.4 F (38 C) O MS *ESCALOFROS O SUDORACIN *NUSEAS Y VMITOS QUE NO SE CONTROLAN CON EL MEDICAMENTO PARA LAS NUSEAS *DIFICULTAD INUSUAL PARA RESPIRAR  *MORETONES O HEMORRAGIAS NO HABITUALES *PROBLEMAS URINARIOS (dolor o ardor al Geographical information systems officer o frecuencia para Geographical information systems officer) *PROBLEMAS INTESTINALES (diarrea inusual, estreimiento, dolor cerca del ano) SENSIBILIDAD EN LA BOCA Y EN LA GARGANTA CON O SIN LA PRESENCIA DE LCERAS (dolor de garganta, llagas en la boca o dolor de  muelas/dientes) ERUPCIN, HINCHAZN O DOLORES INUSUALES FLUJO VAGINAL INUSUAL O PICAZN/RASQUIA    Los puntos marcados con un asterisco ( *) indican una posible emergencia y debe hacer un seguimiento tan pronto como le sea posible o vaya al Departamento de Emergencias si se le presenta algn problema.  Por favor, muestre la [place] DE ADVERTENCIA DE Marc Morgans DE ADVERTENCIA DE Gardiner Fanti al registrarse en [address] de Emergencias y a la enfermera de triaje.  Si tiene preguntas despus de su visita o necesita cancelar o volver a programar su cita, por favor pngase en contacto con [place] [facility]  Dept: [phone] y siga las instrucciones. Las horas de oficina son de 8:00 a.m. a 4:30 p.m. de lunes a viernes. Por favor, tenga en cuenta que los mensajes de voz que se dejan despus de las 4:00 p.m. posiblemente no se devolvern hasta el siguiente da de [place].  Cerramos los fines de semana y [employer]. En todo momento tiene acceso a una enfermera para preguntas urgentes. Por favor, llame al nmero principal de la clnica Dept: [phone] y siga las instrucciones.   Para cualquier pregunta que no sea de carcter urgente, tambin puede ponerse en contacto con su proveedor [employer]. Ahora ofrecemos visitas electrnicas para cualquier persona mayor de 18 aos que solicite atencin mdica en lnea para los sntomas que no sean urgentes. Para ms detalles vaya a [email or web address].   Tambin puede bajar la aplicacin de MyChart! Vaya a la tienda de aplicaciones, busque "MyChart", abra  la aplicacin, seleccione [place], e ingrese con su nombre de usuario y la contrasea de Pharmacist, community.

## 2022-05-30 NOTE — Progress Notes (Signed)
Per Dr. Mosetta Putt - okay to proceed with chemotherapy treatment with platelets of 36 and ANC of 0.1. Patient does not require a blood or platelet transfusion.

## 2022-05-31 ENCOUNTER — Ambulatory Visit (HOSPITAL_COMMUNITY): Payer: Medicare Other

## 2022-05-31 ENCOUNTER — Inpatient Hospital Stay: Payer: Medicare Other

## 2022-05-31 ENCOUNTER — Encounter (HOSPITAL_COMMUNITY): Payer: Self-pay | Admitting: Hematology and Oncology

## 2022-05-31 VITALS — BP 117/55 | HR 72 | Temp 98.5°F | Resp 18

## 2022-05-31 DIAGNOSIS — Z5111 Encounter for antineoplastic chemotherapy: Secondary | ICD-10-CM | POA: Diagnosis not present

## 2022-05-31 DIAGNOSIS — Z170 Estrogen receptor positive status [ER+]: Secondary | ICD-10-CM

## 2022-05-31 MED ORDER — AZACITIDINE CHEMO SQ INJECTION
60.0000 mg/m2 | Freq: Once | INTRAMUSCULAR | Status: AC
  Administered 2022-05-31: 100 mg via SUBCUTANEOUS
  Filled 2022-05-31: qty 4

## 2022-05-31 MED ORDER — ONDANSETRON HCL 8 MG PO TABS
8.0000 mg | ORAL_TABLET | Freq: Once | ORAL | Status: AC
  Administered 2022-05-31: 8 mg via ORAL
  Filled 2022-05-31: qty 1

## 2022-05-31 NOTE — Patient Instructions (Signed)
Instrucciones al darle de alta: Discharge Instructions Gracias por elegir al [facility] de [place] para brindarle atencin mdica de oncologa y hematologa.   Si usted tiene una cita de laboratorio con el Centro de Cncer, por favor vaya directamente al Centro de Cncer y regstrese en el rea de registro.   Use ropa cmoda y adecuada para tener fcil acceso a las vas del Portacath (acceso venoso de larga duracin) o la lnea PICC (catter central colocado por va perifrica).   Nos esforzamos por ofrecerle tiempo de calidad con su proveedor. Es posible que tenga que volver a programar su cita si llega tarde (15 minutos o ms).  El llegar tarde le afecta a usted y a otros pacientes cuyas citas son posteriores a la suya.  Adems, si usted falta a tres o ms citas sin avisar a la oficina, puede ser retirado(a) de la clnica a discrecin del proveedor.      Para las solicitudes de renovacin de recetas, pida a su farmacia que se ponga en contacto con nuestra oficina y deje que transcurran 72 horas para que se complete el proceso de las renovaciones.    Hoy usted recibi los siguientes agentes de quimioterapia e/o inmunoterapia: Vidaza        Para ayudar a prevenir las nuseas y los vmitos despus de su tratamiento, le recomendamos que tome su medicamento para las nuseas segn las indicaciones.  LOS SNTOMAS QUE DEBEN COMUNICARSE INMEDIATAMENTE SE INDICAN A CONTINUACIN: *FIEBRE SUPERIOR A 100.4 F (38 C) O MS *ESCALOFROS O SUDORACIN *NUSEAS Y VMITOS QUE NO SE CONTROLAN CON EL MEDICAMENTO PARA LAS NUSEAS *DIFICULTAD INUSUAL PARA RESPIRAR  *MORETONES O HEMORRAGIAS NO HABITUALES *PROBLEMAS URINARIOS (dolor o ardor al orinar o frecuencia para orinar) *PROBLEMAS INTESTINALES (diarrea inusual, estreimiento, dolor cerca del ano) SENSIBILIDAD EN LA BOCA Y EN LA GARGANTA CON O SIN LA PRESENCIA DE LCERAS (dolor de garganta, llagas en la boca o dolor de muelas/dientes) ERUPCIN,  HINCHAZN O DOLORES INUSUALES FLUJO VAGINAL INUSUAL O PICAZN/RASQUIA    Los puntos marcados con un asterisco ( *) indican una posible emergencia y debe hacer un seguimiento tan pronto como le sea posible o vaya al Departamento de Emergencias si se le presenta algn problema.  Por favor, muestre la [place]AR[place]ETA DE ADVERTENCIA DE QUIMIOTERAPIA O LA TARJETA DE ADVERTENCIA DE INMUNOTERAPIA al registrarse en el Departamento de Emergencias y a la enfermera de triaje.  Si tiene preguntas despus de su visita o necesita cancelar o volver a programar su cita, por favor pngase en contacto con [place] [facility]  Dept: [phone] y siga las instrucciones. Las horas de oficina son de 8:00 a.m. a 4:30 p.m. de lunes a viernes. Por favor, tenga en cuenta que los mensajes de voz que se dejan despus de las 4:00 p.m. posiblemente no se devolvern hasta el siguiente da de trabajo.  Cerramos los fines de semana y los das festivos importantes. En todo momento tiene acceso a una enfermera para preguntas urgentes. Por favor, llame al nmero principal de la clnica Dept: [phone] y siga las instrucciones.   Para cualquier pregunta que no sea de carcter urgente, tambin puede ponerse en contacto con su proveedor utilizando MyChart. Ahora ofrecemos visitas electrnicas para cualquier persona mayor de 18 aos que solicite atencin mdica en lnea para los sntomas que no sean urgentes. Para ms detalles vaya a [email or web address].   Tambin puede bajar la aplicacin de MyChart! Vaya a la tienda de aplicaciones, busque "MyChart",   abra la aplicacin, seleccione [place], e ingrese con su nombre de usuario y la contrasea de MyChart. 

## 2022-06-01 ENCOUNTER — Inpatient Hospital Stay: Payer: Medicare Other

## 2022-06-01 VITALS — BP 114/51 | HR 69 | Temp 98.2°F | Resp 18

## 2022-06-01 DIAGNOSIS — Z5111 Encounter for antineoplastic chemotherapy: Secondary | ICD-10-CM | POA: Diagnosis not present

## 2022-06-01 DIAGNOSIS — Z170 Estrogen receptor positive status [ER+]: Secondary | ICD-10-CM

## 2022-06-01 MED ORDER — AZACITIDINE CHEMO SQ INJECTION
60.0000 mg/m2 | Freq: Once | INTRAMUSCULAR | Status: AC
  Administered 2022-06-01: 100 mg via SUBCUTANEOUS
  Filled 2022-06-01: qty 4

## 2022-06-01 MED ORDER — ONDANSETRON HCL 8 MG PO TABS
8.0000 mg | ORAL_TABLET | Freq: Once | ORAL | Status: AC
  Administered 2022-06-01: 8 mg via ORAL
  Filled 2022-06-01: qty 1

## 2022-06-01 NOTE — Patient Instructions (Signed)
Instrucciones al darle de alta: Discharge Instructions Gracias por elegir al [facility] Cncer de [place] para brindarle atencin mdica de oncologa y hematologa.   Si usted tiene una cita de laboratorio con el Centro de Cncer, por favor vaya directamente al Centro de Cncer y regstrese en el rea de registro.   Use ropa cmoda y adecuada para tener fcil acceso a las vas del Portacath (acceso venoso de larga duracin) o la lnea PICC (catter central colocado por va perifrica).   Nos esforzamos por ofrecerle tiempo de calidad con su proveedor. Es posible que tenga que volver a programar su cita si llega tarde (15 minutos o ms).  El llegar tarde le afecta a usted y a otros pacientes cuyas citas son posteriores a la suya.  Adems, si usted falta a tres o ms citas sin avisar a la oficina, puede ser retirado(a) de la clnica a discrecin del proveedor.      Para las solicitudes de renovacin de recetas, pida a su farmacia que se ponga en contacto con nuestra oficina y deje que transcurran 72 horas para que se complete el proceso de las renovaciones.    Hoy usted recibi los siguientes agentes de quimioterapia e/o inmunoterapia: Vidaza        Para ayudar a prevenir las nuseas y los vmitos despus de su tratamiento, le recomendamos que tome su medicamento para las nuseas segn las indicaciones.  LOS SNTOMAS QUE DEBEN COMUNICARSE INMEDIATAMENTE SE INDICAN A CONTINUACIN: *FIEBRE SUPERIOR A 100.4 F (38 C) O MS *ESCALOFROS O SUDORACIN *NUSEAS Y VMITOS QUE NO SE CONTROLAN CON EL MEDICAMENTO PARA LAS NUSEAS *DIFICULTAD INUSUAL PARA RESPIRAR  *MORETONES O HEMORRAGIAS NO HABITUALES *PROBLEMAS URINARIOS (dolor o ardor al orinar o frecuencia para orinar) *PROBLEMAS INTESTINALES (diarrea inusual, estreimiento, dolor cerca del ano) SENSIBILIDAD EN LA BOCA Y EN LA GARGANTA CON O SIN LA PRESENCIA DE LCERAS (dolor de garganta, llagas en la boca o dolor de muelas/dientes) ERUPCIN,  HINCHAZN O DOLORES INUSUALES FLUJO VAGINAL INUSUAL O PICAZN/RASQUIA    Los puntos marcados con un asterisco ( *) indican una posible emergencia y debe hacer un seguimiento tan pronto como le sea posible o vaya al Departamento de Emergencias si se le presenta algn problema.  Por favor, muestre la TARJETA DE ADVERTENCIA DE QUIMIOTERAPIA O LA TARJETA DE ADVERTENCIA DE INMUNOTERAPIA al registrarse en el Departamento de Emergencias y a la enfermera de triaje.  Si tiene preguntas despus de su visita o necesita cancelar o volver a programar su cita, por favor pngase en contacto con [place] [facility]  Dept: [phone] y siga las instrucciones. Las horas de oficina son de 8:00 a.m. a 4:30 p.m. de lunes a viernes. Por favor, tenga en cuenta que los mensajes de voz que se dejan despus de las 4:00 p.m. posiblemente no se devolvern hasta el siguiente da de trabajo.  Cerramos los fines de semana y los das festivos importantes. En todo momento tiene acceso a una enfermera para preguntas urgentes. Por favor, llame al nmero principal de la clnica Dept: [phone] y siga las instrucciones.   Para cualquier pregunta que no sea de carcter urgente, tambin puede ponerse en contacto con su proveedor utilizando MyChart. Ahora ofrecemos visitas electrnicas para cualquier persona mayor de 18 aos que solicite atencin mdica en lnea para los sntomas que no sean urgentes. Para ms detalles vaya a [email or web address].   Tambin puede bajar la aplicacin de MyChart! Vaya a la tienda de aplicaciones, busque "MyChart",   abra la aplicacin, seleccione [place], e ingrese con su nombre de usuario y la contrasea de MyChart. 

## 2022-06-02 ENCOUNTER — Other Ambulatory Visit: Payer: Self-pay

## 2022-06-02 ENCOUNTER — Inpatient Hospital Stay: Payer: Medicare Other

## 2022-06-02 ENCOUNTER — Inpatient Hospital Stay: Payer: Medicare Other | Admitting: Hematology

## 2022-06-02 VITALS — BP 123/53 | HR 71 | Temp 98.3°F | Resp 16

## 2022-06-02 DIAGNOSIS — Z170 Estrogen receptor positive status [ER+]: Secondary | ICD-10-CM

## 2022-06-02 DIAGNOSIS — D649 Anemia, unspecified: Secondary | ICD-10-CM

## 2022-06-02 DIAGNOSIS — Z5111 Encounter for antineoplastic chemotherapy: Secondary | ICD-10-CM | POA: Diagnosis not present

## 2022-06-02 DIAGNOSIS — D46Z Other myelodysplastic syndromes: Secondary | ICD-10-CM

## 2022-06-02 LAB — CBC WITH DIFFERENTIAL (CANCER CENTER ONLY)
Abs Immature Granulocytes: 0.01 10*3/uL (ref 0.00–0.07)
Basophils Absolute: 0 10*3/uL (ref 0.0–0.1)
Basophils Relative: 1 %
Eosinophils Absolute: 0.1 10*3/uL (ref 0.0–0.5)
Eosinophils Relative: 6 %
HCT: 24.8 % — ABNORMAL LOW (ref 36.0–46.0)
Hemoglobin: 8.5 g/dL — ABNORMAL LOW (ref 12.0–15.0)
Immature Granulocytes: 1 %
Lymphocytes Relative: 74 %
Lymphs Abs: 1.2 10*3/uL (ref 0.7–4.0)
MCH: 29.8 pg (ref 26.0–34.0)
MCHC: 34.3 g/dL (ref 30.0–36.0)
MCV: 87 fL (ref 80.0–100.0)
Monocytes Absolute: 0.1 10*3/uL (ref 0.1–1.0)
Monocytes Relative: 7 %
Neutro Abs: 0.2 10*3/uL — CL (ref 1.7–7.7)
Neutrophils Relative %: 11 %
Platelet Count: 35 10*3/uL — ABNORMAL LOW (ref 150–400)
RBC: 2.85 MIL/uL — ABNORMAL LOW (ref 3.87–5.11)
RDW: 16.8 % — ABNORMAL HIGH (ref 11.5–15.5)
WBC Count: 1.5 10*3/uL — ABNORMAL LOW (ref 4.0–10.5)
WBC Morphology: 2
nRBC: 0 % (ref 0.0–0.2)

## 2022-06-02 LAB — CMP (CANCER CENTER ONLY)
ALT: 12 U/L (ref 0–44)
AST: 15 U/L (ref 15–41)
Albumin: 3.9 g/dL (ref 3.5–5.0)
Alkaline Phosphatase: 206 U/L — ABNORMAL HIGH (ref 38–126)
Anion gap: 7 (ref 5–15)
BUN: 18 mg/dL (ref 8–23)
CO2: 26 mmol/L (ref 22–32)
Calcium: 9.1 mg/dL (ref 8.9–10.3)
Chloride: 101 mmol/L (ref 98–111)
Creatinine: 0.64 mg/dL (ref 0.44–1.00)
GFR, Estimated: >60 mL/min (ref >60)
Glucose, Bld: 413 mg/dL — ABNORMAL HIGH (ref 70–99)
Potassium: 3.9 mmol/L (ref 3.5–5.1)
Sodium: 134 mmol/L — ABNORMAL LOW (ref 135–145)
Total Bilirubin: 0.5 mg/dL (ref 0.3–1.2)
Total Protein: 8.3 g/dL — ABNORMAL HIGH (ref 6.5–8.1)

## 2022-06-02 LAB — SAMPLE TO BLOOD BANK

## 2022-06-02 MED ORDER — AZACITIDINE CHEMO SQ INJECTION
60.0000 mg/m2 | Freq: Once | INTRAMUSCULAR | Status: AC
  Administered 2022-06-02: 100 mg via SUBCUTANEOUS
  Filled 2022-06-02: qty 4

## 2022-06-02 MED ORDER — ONDANSETRON HCL 8 MG PO TABS
8.0000 mg | ORAL_TABLET | Freq: Once | ORAL | Status: AC
  Administered 2022-06-02: 8 mg via ORAL
  Filled 2022-06-02: qty 1

## 2022-06-02 NOTE — Patient Instructions (Signed)
Instrucciones al darle de alta: Discharge Instructions Gracias por elegir al [facility] Cncer de [place] para brindarle atencin mdica de oncologa y hematologa.   Si usted tiene una cita de laboratorio con el Centro de Cncer, por favor vaya directamente al Centro de Cncer y regstrese en el rea de registro.   Use ropa cmoda y adecuada para tener fcil acceso a las vas del Portacath (acceso venoso de larga duracin) o la lnea PICC (catter central colocado por va perifrica).   Nos esforzamos por ofrecerle tiempo de calidad con su proveedor. Es posible que tenga que volver a programar su cita si llega tarde (15 minutos o ms).  El llegar tarde le afecta a usted y a otros pacientes cuyas citas son posteriores a la suya.  Adems, si usted falta a tres o ms citas sin avisar a la oficina, puede ser retirado(a) de la clnica a discrecin del proveedor.      Para las solicitudes de renovacin de recetas, pida a su farmacia que se ponga en contacto con nuestra oficina y deje que transcurran 72 horas para que se complete el proceso de las renovaciones.    Hoy usted recibi los siguientes agentes de quimioterapia e/o inmunoterapia: Vidaza        Para ayudar a prevenir las nuseas y los vmitos despus de su tratamiento, le recomendamos que tome su medicamento para las nuseas segn las indicaciones.  LOS SNTOMAS QUE DEBEN COMUNICARSE INMEDIATAMENTE SE INDICAN A CONTINUACIN: *FIEBRE SUPERIOR A 100.4 F (38 C) O MS *ESCALOFROS O SUDORACIN *NUSEAS Y VMITOS QUE NO SE CONTROLAN CON EL MEDICAMENTO PARA LAS NUSEAS *DIFICULTAD INUSUAL PARA RESPIRAR  *MORETONES O HEMORRAGIAS NO HABITUALES *PROBLEMAS URINARIOS (dolor o ardor al orinar o frecuencia para orinar) *PROBLEMAS INTESTINALES (diarrea inusual, estreimiento, dolor cerca del ano) SENSIBILIDAD EN LA BOCA Y EN LA GARGANTA CON O SIN LA PRESENCIA DE LCERAS (dolor de garganta, llagas en la boca o dolor de muelas/dientes) ERUPCIN,  HINCHAZN O DOLORES INUSUALES FLUJO VAGINAL INUSUAL O PICAZN/RASQUIA    Los puntos marcados con un asterisco ( *) indican una posible emergencia y debe hacer un seguimiento tan pronto como le sea posible o vaya al Departamento de Emergencias si se le presenta algn problema.  Por favor, muestre la TARJETA DE ADVERTENCIA DE QUIMIOTERAPIA O LA TARJETA DE ADVERTENCIA DE INMUNOTERAPIA al registrarse en el Departamento de Emergencias y a la enfermera de triaje.  Si tiene preguntas despus de su visita o necesita cancelar o volver a programar su cita, por favor pngase en contacto con [place] [facility]  Dept: [phone] y siga las instrucciones. Las horas de oficina son de 8:00 a.m. a 4:30 p.m. de lunes a viernes. Por favor, tenga en cuenta que los mensajes de voz que se dejan despus de las 4:00 p.m. posiblemente no se devolvern hasta el siguiente da de trabajo.  Cerramos los fines de semana y los das festivos importantes. En todo momento tiene acceso a una enfermera para preguntas urgentes. Por favor, llame al nmero principal de la clnica Dept: [phone] y siga las instrucciones.   Para cualquier pregunta que no sea de carcter urgente, tambin puede ponerse en contacto con su proveedor utilizando MyChart. Ahora ofrecemos visitas electrnicas para cualquier persona mayor de 18 aos que solicite atencin mdica en lnea para los sntomas que no sean urgentes. Para ms detalles vaya a [email or web address].   Tambin puede bajar la aplicacin de MyChart! Vaya a la tienda de aplicaciones, busque "MyChart",   abra la aplicacin, seleccione [place], e ingrese con su nombre de usuario y la contrasea de MyChart. 

## 2022-06-03 ENCOUNTER — Inpatient Hospital Stay: Payer: Medicare Other

## 2022-06-03 VITALS — BP 111/50 | HR 76 | Temp 98.7°F | Resp 16

## 2022-06-03 DIAGNOSIS — Z5111 Encounter for antineoplastic chemotherapy: Secondary | ICD-10-CM | POA: Diagnosis not present

## 2022-06-03 DIAGNOSIS — Z170 Estrogen receptor positive status [ER+]: Secondary | ICD-10-CM

## 2022-06-03 MED ORDER — AZACITIDINE CHEMO SQ INJECTION
60.0000 mg/m2 | Freq: Once | INTRAMUSCULAR | Status: AC
  Administered 2022-06-03: 100 mg via SUBCUTANEOUS
  Filled 2022-06-03: qty 4

## 2022-06-03 MED ORDER — ONDANSETRON HCL 8 MG PO TABS
8.0000 mg | ORAL_TABLET | Freq: Once | ORAL | Status: AC
  Administered 2022-06-03: 8 mg via ORAL
  Filled 2022-06-03: qty 1

## 2022-06-03 NOTE — Patient Instructions (Signed)
Instrucciones al darle de alta: Discharge Instructions Gracias por elegir al [facility] de Cncer de [place] para brindarle atencin mdica de oncologa y Teacher, English as a foreign language.   Si usted tiene una cita de laboratorio con [employer] de [place], por favor vaya directamente al [employer] de Cncer y regstrese en el rea de Engineer, maintenance (IT).   Use ropa cmoda y [place] para tener fcil acceso a las vas del Portacath (acceso venoso de Set designer duracin) o la lnea PICC (catter central colocado por va perifrica).   Nos esforzamos por ofrecerle tiempo de calidad con su proveedor. Es posible que tenga que volver a programar su cita si llega tarde (15 minutos o ms).  El llegar tarde le afecta a usted y a otros pacientes cuyas citas son posteriores a Armed forces operational officer.  Adems, si usted falta a tres o ms citas sin avisar a la oficina, puede ser retirado(a) de la clnica a discrecin del proveedor.      Para las solicitudes de renovacin de recetas, pida a su farmacia que se ponga en contacto con nuestra oficina y deje que transcurran 72 horas para que se complete el proceso de las renovaciones.    Hoy usted recibi los siguientes agentes de quimioterapia e/o inmunoterapia: Azacidine (Vidaza)      Para ayudar a prevenir las nuseas y los vmitos despus de su tratamiento, le recomendamos que tome su medicamento para las nuseas segn las indicaciones.  LOS SNTOMAS QUE DEBEN COMUNICARSE INMEDIATAMENTE SE INDICAN A CONTINUACIN: *FIEBRE SUPERIOR A 100.4 F (38 C) O MS *ESCALOFROS O SUDORACIN *NUSEAS Y VMITOS QUE NO SE CONTROLAN CON EL MEDICAMENTO PARA LAS NUSEAS *DIFICULTAD INUSUAL PARA RESPIRAR  *MORETONES O HEMORRAGIAS NO HABITUALES *PROBLEMAS URINARIOS (dolor o ardor al Geographical information systems officer o frecuencia para Geographical information systems officer) *PROBLEMAS INTESTINALES (diarrea inusual, estreimiento, dolor cerca del ano) SENSIBILIDAD EN LA BOCA Y EN LA GARGANTA CON O SIN LA PRESENCIA DE LCERAS (dolor de garganta, llagas en la boca o dolor de  muelas/dientes) ERUPCIN, HINCHAZN O DOLORES INUSUALES FLUJO VAGINAL INUSUAL O PICAZN/RASQUIA    Los puntos marcados con un asterisco ( *) indican una posible emergencia y debe hacer un seguimiento tan pronto como le sea posible o vaya al Departamento de Emergencias si se le presenta algn problema.  Por favor, muestre la [place] DE ADVERTENCIA DE Marc Morgans DE ADVERTENCIA DE Gardiner Fanti al registrarse en [address] de Emergencias y a la enfermera de triaje.  Si tiene preguntas despus de su visita o necesita cancelar o volver a programar su cita, por favor pngase en contacto con [place] [facility]  Dept: [phone] y siga las instrucciones. Las horas de oficina son de 8:00 a.m. a 4:30 p.m. de lunes a viernes. Por favor, tenga en cuenta que los mensajes de voz que se dejan despus de las 4:00 p.m. posiblemente no se devolvern hasta el siguiente da de [place].  Cerramos los fines de semana y [employer]. En todo momento tiene acceso a una enfermera para preguntas urgentes. Por favor, llame al nmero principal de la clnica Dept: [phone] y siga las instrucciones.   Para cualquier pregunta que no sea de carcter urgente, tambin puede ponerse en contacto con su proveedor [employer]. Ahora ofrecemos visitas electrnicas para cualquier persona mayor de 18 aos que solicite atencin mdica en lnea para los sntomas que no sean urgentes. Para ms detalles vaya a [email or web address].   Tambin puede bajar la aplicacin de MyChart! Vaya a la tienda de aplicaciones, busque "MyChart", abra  la aplicacin, seleccione [place], e ingrese con su nombre de usuario y la contrasea de Pharmacist, community.

## 2022-06-06 ENCOUNTER — Ambulatory Visit: Payer: Self-pay | Admitting: *Deleted

## 2022-06-06 ENCOUNTER — Other Ambulatory Visit: Payer: Self-pay

## 2022-06-06 ENCOUNTER — Other Ambulatory Visit: Payer: Self-pay | Admitting: *Deleted

## 2022-06-06 ENCOUNTER — Inpatient Hospital Stay: Payer: Medicare Other

## 2022-06-06 ENCOUNTER — Other Ambulatory Visit: Payer: Self-pay | Admitting: Hematology

## 2022-06-06 DIAGNOSIS — Z5111 Encounter for antineoplastic chemotherapy: Secondary | ICD-10-CM | POA: Diagnosis not present

## 2022-06-06 DIAGNOSIS — D649 Anemia, unspecified: Secondary | ICD-10-CM

## 2022-06-06 DIAGNOSIS — D46Z Other myelodysplastic syndromes: Secondary | ICD-10-CM

## 2022-06-06 DIAGNOSIS — C50212 Malignant neoplasm of upper-inner quadrant of left female breast: Secondary | ICD-10-CM

## 2022-06-06 LAB — BPAM RBC
Blood Product Expiration Date: 202406102359
ISSUE DATE / TIME: 202405131038

## 2022-06-06 LAB — CBC WITH DIFFERENTIAL (CANCER CENTER ONLY)
Abs Immature Granulocytes: 0 10*3/uL (ref 0.00–0.07)
Basophils Absolute: 0 10*3/uL (ref 0.0–0.1)
Basophils Relative: 1 %
Eosinophils Absolute: 0.1 10*3/uL (ref 0.0–0.5)
Eosinophils Relative: 7 %
HCT: 21.3 % — ABNORMAL LOW (ref 36.0–46.0)
Hemoglobin: 7.6 g/dL — ABNORMAL LOW (ref 12.0–15.0)
Immature Granulocytes: 0 %
Lymphocytes Relative: 79 %
Lymphs Abs: 1.2 10*3/uL (ref 0.7–4.0)
MCH: 30.3 pg (ref 26.0–34.0)
MCHC: 35.7 g/dL (ref 30.0–36.0)
MCV: 84.9 fL (ref 80.0–100.0)
Monocytes Absolute: 0.1 10*3/uL (ref 0.1–1.0)
Monocytes Relative: 5 %
Neutro Abs: 0.1 10*3/uL — CL (ref 1.7–7.7)
Neutrophils Relative %: 8 %
Platelet Count: 27 10*3/uL — ABNORMAL LOW (ref 150–400)
RBC: 2.51 MIL/uL — ABNORMAL LOW (ref 3.87–5.11)
RDW: 16.6 % — ABNORMAL HIGH (ref 11.5–15.5)
WBC Count: 1.5 10*3/uL — ABNORMAL LOW (ref 4.0–10.5)
WBC Morphology: 2
nRBC: 0 % (ref 0.0–0.2)

## 2022-06-06 LAB — PREPARE RBC (CROSSMATCH)

## 2022-06-06 LAB — TYPE AND SCREEN
Antibody Screen: NEGATIVE
Unit division: 0

## 2022-06-06 LAB — SAMPLE TO BLOOD BANK

## 2022-06-06 LAB — FERRITIN: Ferritin: 933 ng/mL — ABNORMAL HIGH (ref 11–307)

## 2022-06-06 MED ORDER — SODIUM CHLORIDE 0.9% IV SOLUTION
250.0000 mL | Freq: Once | INTRAVENOUS | Status: AC
  Administered 2022-06-06: 250 mL via INTRAVENOUS

## 2022-06-06 MED ORDER — ACETAMINOPHEN 325 MG PO TABS
650.0000 mg | ORAL_TABLET | Freq: Once | ORAL | Status: AC
  Administered 2022-06-06: 650 mg via ORAL
  Filled 2022-06-06: qty 2

## 2022-06-06 MED ORDER — DIPHENHYDRAMINE HCL 25 MG PO CAPS
25.0000 mg | ORAL_CAPSULE | Freq: Once | ORAL | Status: AC
  Administered 2022-06-06: 25 mg via ORAL
  Filled 2022-06-06: qty 1

## 2022-06-06 NOTE — Patient Outreach (Addendum)
  Care Coordination   06/06/2022 Name: Amanda Duarte MRN: [id number] DOB: 22-Sep-1955   Care Coordination Outreach Attempts:  An unsuccessful telephone outreach was attempted for a scheduled appointment today.  Call made and voice message left via [address], [place], [place] [id number]  Follow Up Plan:  Additional outreach attempts will be made to offer the patient care coordination information and services.   Encounter Outcome:  No Answer   Care Coordination Interventions:  No, not indicated    Kemper Durie, RN, MSN, [facility] Care Management Care Management Coordinator [phone]

## 2022-06-06 NOTE — Progress Notes (Signed)
Per Dr. Mosetta Putt ok to run blood at 324ml/hr due to delay in getting blood product from blood bank.

## 2022-06-06 NOTE — Patient Instructions (Signed)
Transfusin de sangre en los adultos, cuidados posteriores Blood Transfusion, Adult, Care After Despus de una transfusin de sangre, es comn presentar lo siguiente: Moretones y dolor en el lugar de la va intravenosa (IV). Dolor de cabeza. Siga estas instrucciones en su casa: El mdico podr darle ms instrucciones. Si tiene problemas, llame al mdico. Cuidados del lugar de la insercin     Siga las instrucciones del mdico en lo que respecta al cuidado del lugar de insercin. Este es el lugar donde se coloc un tubo (catter) intravenoso en la vena. Asegrese de hacer lo siguiente: Lvese las manos con agua y jabn durante al menos 20 segundos antes y despus de cambiarse la venda. Use un desinfectante para manos si no dispone de agua y jabn. Cambie las vendas como se lo haya indicado el mdico. Controle el lugar de insercin todos los das para detectar signos de infeccin. Est atento a los siguientes signos: Dolor, hinchazn o enrojecimiento. Sangrado proveniente del lugar. Calor. Pus o mal olor. Instrucciones generales Use los medicamentos de venta libre y los recetados solamente como se lo haya indicado el mdico. Haga reposo como se lo haya indicado el mdico. Retome sus actividades habituales como se lo haya indicado el mdico. Concurra a todas las visitas de seguimiento. Es posible que deba hacerse anlisis en ciertos momentos para controlar la sangre. Comunquese con un mdico si: Tiene picazn o zonas enrojecidas e hinchadas en la piel (urticaria). Tiene fiebre o escalofros. Tiene dolor de cabeza, en la espalda o el pecho. Est preocupado o nervioso (ansioso). Se siente dbil despus de realizar sus actividades habituales. Tiene alguno de los siguientes problemas en el lugar de la insercin: Enrojecimiento, hinchazn, calor o dolor. Sangrado que no se detiene al ejercer presin. Pus o mal olor. Si recibi la transfusin de sangre en un entorno para pacientes  ambulatorios, le indicarn con quin debe ponerse en contacto para informar cualquier reaccin. Solicite ayuda de inmediato si: Tiene signos de una reaccin grave. Puede deberse a una alergia o provenir del sistema de defensa del cuerpo (sistema inmunitario). Algunos signos son los siguientes: Dificultad para respirar o falta de aire. Hinchazn en la cara o sensacin de calor (sofoco). Una erupcin cutnea generalizada. Pis (orina) de color oscuro o sangre en el pis. Latidos cardacos acelerados. Estos sntomas pueden indicar una emergencia. Solicite ayuda de inmediato. Llame al 911. No espere a ver si los sntomas desaparecen. No conduzca por sus propios medios hasta el hospital. Resumen Es normal tener moretones y dolor en el lugar donde se coloc la va intravenosa (IV). Controle el lugar de insercin todos los das para detectar signos de infeccin. Haga reposo como se lo haya indicado el mdico. Retome sus actividades habituales como se lo haya indicado el mdico. Obtenga ayuda de inmediato si tiene signos de una reaccin grave. Esta informacin no tiene como fin reemplazar el consejo del mdico. Asegrese de hacerle al mdico cualquier pregunta que tenga. Document Revised: 05/06/2021 Document Reviewed: 05/06/2021 Elsevier Patient Education  2023 Elsevier Inc.  

## 2022-06-07 LAB — TYPE AND SCREEN: ABO/RH(D): O POS

## 2022-06-07 LAB — BPAM RBC: Unit Type and Rh: 5100

## 2022-06-09 ENCOUNTER — Telehealth: Payer: Self-pay

## 2022-06-09 ENCOUNTER — Other Ambulatory Visit: Payer: Self-pay

## 2022-06-09 ENCOUNTER — Other Ambulatory Visit: Payer: Self-pay | Admitting: Family Medicine

## 2022-06-09 ENCOUNTER — Inpatient Hospital Stay: Payer: Medicare Other

## 2022-06-09 DIAGNOSIS — C50212 Malignant neoplasm of upper-inner quadrant of left female breast: Secondary | ICD-10-CM

## 2022-06-09 DIAGNOSIS — Z5111 Encounter for antineoplastic chemotherapy: Secondary | ICD-10-CM | POA: Diagnosis not present

## 2022-06-09 DIAGNOSIS — D649 Anemia, unspecified: Secondary | ICD-10-CM

## 2022-06-09 DIAGNOSIS — I10 Essential (primary) hypertension: Secondary | ICD-10-CM

## 2022-06-09 DIAGNOSIS — D46Z Other myelodysplastic syndromes: Secondary | ICD-10-CM

## 2022-06-09 LAB — CBC WITH DIFFERENTIAL (CANCER CENTER ONLY)
Abs Immature Granulocytes: 0 10*3/uL (ref 0.00–0.07)
Basophils Absolute: 0 10*3/uL (ref 0.0–0.1)
Basophils Relative: 1 %
Blasts: 1 %
Eosinophils Absolute: 0.1 10*3/uL (ref 0.0–0.5)
Eosinophils Relative: 8 %
HCT: 24.8 % — ABNORMAL LOW (ref 36.0–46.0)
Hemoglobin: 8.9 g/dL — ABNORMAL LOW (ref 12.0–15.0)
Lymphocytes Relative: 85 %
Lymphs Abs: 1.2 10*3/uL (ref 0.7–4.0)
MCH: 29.9 pg (ref 26.0–34.0)
MCHC: 35.9 g/dL (ref 30.0–36.0)
MCV: 83.2 fL (ref 80.0–100.0)
Metamyelocytes Relative: 1 %
Monocytes Absolute: 0 10*3/uL — ABNORMAL LOW (ref 0.1–1.0)
Monocytes Relative: 0 %
Neutro Abs: 0.1 10*3/uL — CL (ref 1.7–7.7)
Neutrophils Relative %: 4 %
Platelet Count: 16 10*3/uL — ABNORMAL LOW (ref 150–400)
RBC: 2.98 MIL/uL — ABNORMAL LOW (ref 3.87–5.11)
RDW: 15.6 % — ABNORMAL HIGH (ref 11.5–15.5)
WBC Count: 1.4 10*3/uL — ABNORMAL LOW (ref 4.0–10.5)
nRBC: 0 % (ref 0.0–0.2)

## 2022-06-09 LAB — CMP (CANCER CENTER ONLY)
ALT: 13 U/L (ref 0–44)
AST: 18 U/L (ref 15–41)
Albumin: 3.7 g/dL (ref 3.5–5.0)
Alkaline Phosphatase: 213 U/L — ABNORMAL HIGH (ref 38–126)
Anion gap: 7 (ref 5–15)
BUN: 13 mg/dL (ref 8–23)
CO2: 26 mmol/L (ref 22–32)
Calcium: 8.6 mg/dL — ABNORMAL LOW (ref 8.9–10.3)
Chloride: 103 mmol/L (ref 98–111)
Creatinine: 0.54 mg/dL (ref 0.44–1.00)
GFR, Estimated: >60 mL/min (ref >60)
Glucose, Bld: 292 mg/dL — ABNORMAL HIGH (ref 70–99)
Potassium: 3.7 mmol/L (ref 3.5–5.1)
Sodium: 136 mmol/L (ref 135–145)
Total Bilirubin: 0.6 mg/dL (ref 0.3–1.2)
Total Protein: 7.9 g/dL (ref 6.5–8.1)

## 2022-06-09 LAB — PREPARE PLATELET PHERESIS

## 2022-06-09 LAB — SAMPLE TO BLOOD BANK

## 2022-06-09 MED ORDER — LOSARTAN POTASSIUM 50 MG PO TABS
50.0000 mg | ORAL_TABLET | Freq: Every day | ORAL | 2 refills | Status: DC
Start: 2022-06-09 — End: 2022-07-08
  Filled 2022-06-09: qty 30, 30d supply, fill #0

## 2022-06-09 MED ORDER — SODIUM CHLORIDE 0.9% IV SOLUTION
250.0000 mL | Freq: Once | INTRAVENOUS | Status: AC
  Administered 2022-06-09: 250 mL via INTRAVENOUS

## 2022-06-09 MED ORDER — ACETAMINOPHEN 325 MG PO TABS
650.0000 mg | ORAL_TABLET | Freq: Once | ORAL | Status: AC
  Administered 2022-06-09: 650 mg via ORAL
  Filled 2022-06-09: qty 2

## 2022-06-09 MED ORDER — DIPHENHYDRAMINE HCL 25 MG PO CAPS
25.0000 mg | ORAL_CAPSULE | Freq: Once | ORAL | Status: AC
  Administered 2022-06-09: 25 mg via ORAL
  Filled 2022-06-09: qty 1

## 2022-06-09 MED ORDER — SODIUM CHLORIDE 0.9% FLUSH: 10.0000 mL | INTRAVENOUS | Status: DC | PRN

## 2022-06-09 NOTE — Telephone Encounter (Signed)
CRITICAL VALUE STICKER  CRITICAL VALUE:   ANC 0.1  RECEIVER (on-site recipient of call):  Daneil Dolin, LPN  DATE & TIME NOTIFIED: 06/09/2022   08:38  MESSENGER (representative from lab):  Hilda Lias  MD NOTIFIED:   Malachy Mood, MD  TIME OF NOTIFICATION:  08:40  RESPONSE:

## 2022-06-09 NOTE — Telephone Encounter (Signed)
Called blood bank to make sure they could see order. Verbal order given to administer 1 unit of Platelets from Dr. Mosetta Putt, spoke with [place] in blood bank, patient will have 1 unit of platelets transfused on 06/09/2022.

## 2022-06-09 NOTE — Patient Instructions (Signed)
Platelet Transfusion A platelet transfusion is a procedure in which a person receives donated platelets through an IV. Platelets are parts of blood that stick together and form a clot to help the body stop bleeding after an injury. If you have too few platelets, your blood may have trouble clotting. This may cause you to bleed and bruise very easily. You may need a platelet transfusion if you have a condition that causes a low number of platelets (thrombocytopenia). A platelet transfusion may be used to stop or prevent excessive bleeding. Tell a health care provider about: Any reactions you have had during previous transfusions. Any allergies you have. All medicines you are taking, including vitamins, herbs, eye drops, creams, and over-the-counter medicines. Any bleeding problems you have. Any surgeries you have had. Any medical conditions you have. Whether you are pregnant or may be pregnant. What are the risks? Generally, this is a safe procedure. However, problems may occur, including: Fever. Infection. Allergic reaction to the donated (donor) platelets. Your body's disease-fighting system (immune system) attacking the donor platelets (hemolytic reaction). This is rare. A rare reaction that causes lung damage (transfusion-related acute lung injury). What happens before the procedure? Medicines Ask your health care provider about: Changing or stopping your regular medicines. This is especially important if you are taking diabetes medicines or blood thinners. Taking medicines such as aspirin and ibuprofen. These medicines can thin your blood. Do not take these medicines unless your health care provider tells you to take them. Taking over-the-counter medicines, vitamins, herbs, and supplements. General instructions You will have a blood test to determine your blood type. Your blood type determines what kind of platelets you will be given. Follow instructions from your health care provider  about eating or drinking restrictions. If you have had an allergic reaction to a transfusion in the past, you may be given medicine to help prevent a reaction. Your temperature, blood pressure, pulse, and breathing will be monitored. What happens during the procedure?  An IV will be inserted into one of your veins. For your safety, two health care providers will verify your identity along with the donor platelets about to be infused. A bag of donor platelets will be connected to your IV. The platelets will flow into your bloodstream. This usually takes 30-60 minutes. Your temperature, blood pressure, pulse, and breathing will be monitored during the transfusion. This helps detect early signs of any reaction. You will also be monitored for other symptoms that may indicate a reaction, including chills, hives, or itching. If you have signs of a reaction at any time, your transfusion will be stopped, and you may be given medicine to help manage the reaction. When your transfusion is complete, your IV will be removed. Pressure may be applied to the IV site for a few minutes to stop any bleeding. The IV site will be covered with a bandage (dressing). The procedure may vary among health care providers and hospitals. What can I expect after the procedure? Your blood pressure, temperature, pulse, and breathing will be monitored until you leave the hospital or clinic. You may have some bruising and soreness at your IV site. Follow these instructions at home: Medicines Take over-the-counter and prescription medicines only as told by your health care provider. Talk with your health care provider before you take any medicines that contain aspirin or NSAIDs, such as ibuprofen. These medicines increase your risk for dangerous bleeding. IV site care Check your IV site every day for signs of infection. Check for:   Redness, swelling, or pain. Fluid or blood. If fluid or blood drains from your IV site, use your  hands to press down firmly on a bandage covering the area for a minute or two. Doing this should stop the bleeding. Warmth. Pus or a bad smell. General instructions Change or remove your dressing as told by your health care provider. Return to your normal activities as told by your health care provider. Ask your health care provider what activities are safe for you. Do not take baths, swim, or use a hot tub until your health care provider approves. Ask your health care provider if you may take showers. Keep all follow-up visits. This is important. Contact a health care provider if: You have a headache that does not go away with medicine. You have hives, rash, or itchy skin. You have nausea or vomiting. You feel unusually tired or weak. You have signs of infection at your IV site. Get help right away if: You have a fever or chills. You urinate less often than usual. Your urine is darker colored than normal. You have any of the following: Trouble breathing. Pain in your back, abdomen, or chest. Cool, clammy skin. A fast heartbeat. Summary Platelets are tiny pieces of blood cells that clump together to form a blood clot when you have an injury. If you have too few platelets, your blood may have trouble clotting. A platelet transfusion is a procedure in which you receive donated platelets through an IV. A platelet transfusion may be used to stop or prevent excessive bleeding. After the procedure, check your IV site every day for signs of infection. This information is not intended to replace advice given to you by your health care provider. Make sure you discuss any questions you have with your health care provider. Document Revised: 07/16/2020 Document Reviewed: 07/16/2020 Elsevier Patient Education  2023 Elsevier Inc.  

## 2022-06-09 NOTE — Telephone Encounter (Signed)
Requested Prescriptions  Pending Prescriptions Disp Refills   losartan (COZAAR) 50 MG tablet 30 tablet 2    Sig: Take 1 tablet (50 mg total) by mouth daily.     Cardiovascular:  Angiotensin Receptor Blockers Passed - 06/09/2022  5:14 PM      Passed - Cr in normal range and within 180 days    Creatinine  Date Value Ref Range Status  06/09/2022 0.54 0.44 - 1.00 mg/dL Final   Creatinine, POC  Date Value Ref Range Status  09/07/2016 50 mg/dL Final         Passed - K in normal range and within 180 days    Potassium  Date Value Ref Range Status  06/09/2022 3.7 3.5 - 5.1 mmol/L Final         Passed - Patient is not pregnant      Passed - Last BP in normal range    BP Readings from Last 1 Encounters:  06/09/22 (!) 113/50         Passed - Valid encounter within last 6 months    Recent Outpatient Visits           3 weeks ago Type 2 diabetes mellitus with hyperglycemia, with long-term current use of insulin (HCC)   [place] [facility] [place], Shea Stakes, NP   5 months ago Essential hypertension   [place] [facility] [place], [place] W, NP   7 months ago Type 2 diabetes mellitus with hyperglycemia, with long-term current use of insulin [facility])   [facility] [place], [place], [place]   8 months ago Essential hypertension   [place] [facility] [place], [place] W, NP   1 year ago Type 2 diabetes mellitus with hyperglycemia, with long-term current use of insulin [facility])    [facility] [place], Shea Stakes, NP       Future Appointments             In 2 months Claiborne Rigg, NP [facility]

## 2022-06-09 NOTE — Telephone Encounter (Signed)
CRITICAL VALUE STICKER  CRITICAL VALUE: ANC 0.1  RECEIVER (on-site recipient of call): Sharlette Dense CMA  DATE & TIME NOTIFIED: 06/09/2022 0913  MESSENGER (representative from lab): Hilda Lias in lab  MD NOTIFIED: Dr. Mosetta Putt  TIME OF NOTIFICATION: 0913  RESPONSE:  Made physician aware

## 2022-06-10 ENCOUNTER — Other Ambulatory Visit: Payer: Self-pay

## 2022-06-10 LAB — BPAM PLATELET PHERESIS
Blood Product Expiration Date: 202405182359
ISSUE DATE / TIME: 202405160950
Unit Type and Rh: 7300

## 2022-06-10 LAB — PREPARE PLATELET PHERESIS: Unit division: 0

## 2022-06-13 ENCOUNTER — Inpatient Hospital Stay: Payer: Medicare Other

## 2022-06-13 ENCOUNTER — Other Ambulatory Visit: Payer: Self-pay

## 2022-06-13 ENCOUNTER — Telehealth: Payer: Self-pay

## 2022-06-13 DIAGNOSIS — Z5111 Encounter for antineoplastic chemotherapy: Secondary | ICD-10-CM | POA: Diagnosis not present

## 2022-06-13 DIAGNOSIS — D649 Anemia, unspecified: Secondary | ICD-10-CM

## 2022-06-13 DIAGNOSIS — D46Z Other myelodysplastic syndromes: Secondary | ICD-10-CM

## 2022-06-13 DIAGNOSIS — Z170 Estrogen receptor positive status [ER+]: Secondary | ICD-10-CM

## 2022-06-13 LAB — TYPE AND SCREEN
ABO/RH(D): O POS
Antibody Screen: NEGATIVE
Unit division: 0

## 2022-06-13 LAB — SAMPLE TO BLOOD BANK

## 2022-06-13 LAB — CBC WITH DIFFERENTIAL (CANCER CENTER ONLY)
Abs Immature Granulocytes: 0.02 10*3/uL (ref 0.00–0.07)
Basophils Absolute: 0 10*3/uL (ref 0.0–0.1)
Basophils Relative: 1 %
Eosinophils Absolute: 0 10*3/uL (ref 0.0–0.5)
Eosinophils Relative: 3 %
HCT: 21.7 % — ABNORMAL LOW (ref 36.0–46.0)
Hemoglobin: 7.5 g/dL — ABNORMAL LOW (ref 12.0–15.0)
Immature Granulocytes: 2 %
Lymphocytes Relative: 85 %
Lymphs Abs: 1 10*3/uL (ref 0.7–4.0)
MCH: 29.2 pg (ref 26.0–34.0)
MCHC: 34.6 g/dL (ref 30.0–36.0)
MCV: 84.4 fL (ref 80.0–100.0)
Monocytes Absolute: 0 10*3/uL — ABNORMAL LOW (ref 0.1–1.0)
Monocytes Relative: 3 %
Neutro Abs: 0.1 10*3/uL — CL (ref 1.7–7.7)
Neutrophils Relative %: 6 %
Platelet Count: 9 10*3/uL — CL (ref 150–400)
RBC: 2.57 MIL/uL — ABNORMAL LOW (ref 3.87–5.11)
RDW: 15.2 % (ref 11.5–15.5)
Smear Review: DECREASED
WBC Count: 1.2 10*3/uL — ABNORMAL LOW (ref 4.0–10.5)
nRBC: 0 % (ref 0.0–0.2)

## 2022-06-13 LAB — PREPARE RBC (CROSSMATCH)

## 2022-06-13 LAB — BPAM PLATELET PHERESIS
Blood Product Expiration Date: 202405222359
ISSUE DATE / TIME: 202405201011
Unit Type and Rh: 5100

## 2022-06-13 LAB — BPAM RBC: ISSUE DATE / TIME: 202405201017

## 2022-06-13 LAB — PREPARE PLATELET PHERESIS

## 2022-06-13 MED ORDER — ACETAMINOPHEN 325 MG PO TABS
650.0000 mg | ORAL_TABLET | Freq: Once | ORAL | Status: AC
  Administered 2022-06-13: 650 mg via ORAL
  Filled 2022-06-13: qty 2

## 2022-06-13 MED ORDER — DIPHENHYDRAMINE HCL 25 MG PO CAPS
25.0000 mg | ORAL_CAPSULE | Freq: Once | ORAL | Status: AC
  Administered 2022-06-13: 25 mg via ORAL
  Filled 2022-06-13: qty 1

## 2022-06-13 MED ORDER — SODIUM CHLORIDE 0.9% IV SOLUTION
250.0000 mL | Freq: Once | INTRAVENOUS | Status: AC
  Administered 2022-06-13: 250 mL via INTRAVENOUS

## 2022-06-13 NOTE — Telephone Encounter (Signed)
CRITICAL VALUE STICKER  CRITICAL VALUE:  Platelets 9,  ANC 0.1  RECEIVER (on-site recipient of call):  Daneil Dolin, LPN  DATE & TIME NOTIFIED: 08:11   06/13/22  MESSENGER (representative from lab):  Herbert Seta  MD NOTIFIED: Babs Bertin (Dr on call)   TIME OF NOTIFICATION:  08:17  RESPONSE:

## 2022-06-13 NOTE — Progress Notes (Signed)
Critical lab value reported: Plt 9 ANC 0.1  Order for 1 unit of Plts given and placed.

## 2022-06-14 ENCOUNTER — Encounter: Payer: Self-pay | Admitting: Hematology

## 2022-06-14 ENCOUNTER — Telehealth: Payer: Self-pay

## 2022-06-14 LAB — BPAM PLATELET PHERESIS

## 2022-06-14 LAB — PREPARE PLATELET PHERESIS: Unit division: 0

## 2022-06-14 LAB — BPAM RBC
Blood Product Expiration Date: 202406172359
Unit Type and Rh: 5100

## 2022-06-14 LAB — TYPE AND SCREEN

## 2022-06-14 NOTE — Telephone Encounter (Signed)
ERROR

## 2022-06-16 ENCOUNTER — Other Ambulatory Visit: Payer: Self-pay

## 2022-06-16 ENCOUNTER — Telehealth: Payer: Self-pay

## 2022-06-16 ENCOUNTER — Inpatient Hospital Stay: Payer: Medicare Other

## 2022-06-16 DIAGNOSIS — D649 Anemia, unspecified: Secondary | ICD-10-CM

## 2022-06-16 DIAGNOSIS — D46Z Other myelodysplastic syndromes: Secondary | ICD-10-CM

## 2022-06-16 DIAGNOSIS — Z170 Estrogen receptor positive status [ER+]: Secondary | ICD-10-CM

## 2022-06-16 DIAGNOSIS — Z5111 Encounter for antineoplastic chemotherapy: Secondary | ICD-10-CM | POA: Diagnosis not present

## 2022-06-16 LAB — CBC WITH DIFFERENTIAL (CANCER CENTER ONLY)
Abs Immature Granulocytes: 0 10*3/uL (ref 0.00–0.07)
Basophils Absolute: 0 10*3/uL (ref 0.0–0.1)
Basophils Relative: 2 %
Eosinophils Absolute: 0.1 10*3/uL (ref 0.0–0.5)
Eosinophils Relative: 8 %
HCT: 26.9 % — ABNORMAL LOW (ref 36.0–46.0)
Hemoglobin: 9.3 g/dL — ABNORMAL LOW (ref 12.0–15.0)
Lymphocytes Relative: 84 %
Lymphs Abs: 1.1 10*3/uL (ref 0.7–4.0)
MCH: 29.3 pg (ref 26.0–34.0)
MCHC: 34.6 g/dL (ref 30.0–36.0)
MCV: 84.9 fL (ref 80.0–100.0)
Monocytes Absolute: 0 10*3/uL — ABNORMAL LOW (ref 0.1–1.0)
Monocytes Relative: 1 %
Neutro Abs: 0.1 10*3/uL — CL (ref 1.7–7.7)
Neutrophils Relative %: 5 %
Platelet Count: 14 10*3/uL — ABNORMAL LOW (ref 150–400)
RBC: 3.17 MIL/uL — ABNORMAL LOW (ref 3.87–5.11)
RDW: 14.6 % (ref 11.5–15.5)
WBC Count: 1.3 10*3/uL — ABNORMAL LOW (ref 4.0–10.5)
nRBC: 0 % (ref 0.0–0.2)

## 2022-06-16 LAB — PREPARE PLATELET PHERESIS: Unit division: 0

## 2022-06-16 LAB — SAMPLE TO BLOOD BANK

## 2022-06-16 MED ORDER — SODIUM CHLORIDE 0.9% FLUSH: 10.0000 mL | INTRAVENOUS | Status: DC | PRN

## 2022-06-16 MED ORDER — SODIUM CHLORIDE 0.9% IV SOLUTION
250.0000 mL | Freq: Once | INTRAVENOUS | Status: AC
  Administered 2022-06-16: 250 mL via INTRAVENOUS

## 2022-06-16 MED ORDER — DIPHENHYDRAMINE HCL 25 MG PO CAPS
25.0000 mg | ORAL_CAPSULE | Freq: Once | ORAL | Status: AC
  Administered 2022-06-16: 25 mg via ORAL
  Filled 2022-06-16: qty 1

## 2022-06-16 MED ORDER — ACETAMINOPHEN 325 MG PO TABS
650.0000 mg | ORAL_TABLET | Freq: Once | ORAL | Status: AC
  Administered 2022-06-16: 650 mg via ORAL
  Filled 2022-06-16: qty 2

## 2022-06-16 NOTE — Patient Instructions (Signed)
Transfusin de plaquetas Platelet Transfusion La transfusin de plaquetas es un procedimiento en el cual una persona recibe plaquetas de un donante a travs de una va intravenosa. Las plaquetas son fragmentos de la sangre que se unen y forman un cogulo para ayudar a que el cuerpo deje de sangrar despus de una lesin. Si tiene muy pocas plaquetas, es posible que su sangre tenga problemas para coagular. Esto puede causar sangrado y moretones con mucha facilidad. Tal vez deba recibir una transfusin de plaquetas si tiene una enfermedad que reduce el nmero de plaquetas (trombocitopenia). Se puede usar una transfusin de plaquetas para detener o evitar el sangrado excesivo. Informe al mdico acerca de lo siguiente: Las reacciones que haya tenido durante transfusiones previas. Cualquier alergia que tenga. Todos los medicamentos que usa, incluidos vitaminas, hierbas, gotas oftlmicas, cremas y medicamentos de venta libre. Cualquier problema de la sangre que tenga. Cirugas a las que se haya sometido. Cualquier afeccin mdica que tenga. Si est embarazada o podra estarlo. Cules son los riesgos? En general, se trata de un procedimiento seguro. No obstante, pueden ocurrir complicaciones, por ejemplo: Fiebre. Infeccin. Reaccin alrgica a las plaquetas donadas (de un donante). Que el sistema de su cuerpo que combate las enfermedades (sistema inmunitario) ataque las plaquetas del donante (reaccin hemoltica). Esto es poco frecuente. Una reaccin poco frecuente que causa dao pulmonar (lesin pulmonar aguda producida por transfusin). Qu ocurre antes del procedimiento? Medicamentos Consulte al mdico si debe hacer o no lo siguiente: Cambiar o suspender los medicamentos que usa habitualmente. Esto es muy importante si toma medicamentos para la diabetes o anticoagulantes. Tomar medicamentos como aspirina e ibuprofeno. Estos medicamentos pueden tener un efecto anticoagulante en la sangre. No tome  estos medicamentos a menos que el mdico se lo indique. Usar medicamentos de venta libre, vitaminas, hierbas y suplementos. Indicaciones generales Se le realizar un anlisis de sangre para determinar su grupo sanguneo. Su tipo de sangre determina qu tipo de plaquetas recibir. Siga las instrucciones del mdico respecto de las restricciones en las comidas o las bebidas. Si tuvo una reaccin alrgica a una transfusin en el pasado, tal vez le administren un medicamento que ayude a evitarla. Le controlarn la temperatura, la presin arterial, el pulso y la respiracin. Qu ocurre durante el procedimiento?  Le colocarn una va intravenosa (i.v.) en una vena. Por su seguridad, dos mdicos verificarn su identidad y las plaquetas del donante que se le van a infundir. Una bolsa con las plaquetas del donante se conectar a su va intravenosa. Las plaquetas ingresarn en su torrente sanguneo. Esto generalmente demora entre 30 y 60 minutos. Durante la transfusin, le controlarn la temperatura, la presin arterial, el pulso y la respiracin. Esto ayuda a detectar signos tempranos de una reaccin. Tambin lo controlarn para detectar otros sntomas que puedan indicar una reaccin, como escalofros, urticaria o picazn. Si tiene signos de una reaccin en algn momento, se suspender la transfusin y tal vez le administren un medicamento para controlar la reaccin. Una vez finalizada la transfusin, se retirar la va intravenosa. Se puede aplicar presin en el lugar de la va intravenosa (i.v.) para detener cualquier sangrado. El lugar de la va intravenosa (i.v.) se cubrir con un vendaje (venda). El procedimiento puede variar segn el mdico y el hospital. Qu puedo esperar despus del procedimiento? Le controlarn la presin arterial, la temperatura, el pulso y la respiracin hasta que abandone el hospital o la clnica. Es posible que tenga algunos moretones y sienta dolor en el lugar de la   va  intravenosa (i.v.). Siga estas indicaciones en su casa: Medicamentos Use los medicamentos de venta libre y los recetados solamente como se lo haya indicado el mdico. Hable con el mdico antes de tomar cualquier medicamento que contenga aspirina o antiinflamatorios no esteroideos (AINE), como el ibuprofeno. Estos medicamentos aumentan el riesgo de tener una hemorragia peligrosa. Cuidado del lugar de la va intravenosa (i.v.) Controle el lugar de la va intravenosa (i.v.) todos los das para descartar signos de infeccin. Est atento a los siguientes signos: Enrojecimiento, hinchazn o dolor. Lquido o sangre. Si sale lquido o sangre del lugar de la va intravenosa (i.v.), ejerza presin firme con las manos sobre el vendaje que cubre la zona durante uno o dos minutos. Al hacerlo, debera detenerse el sangrado. Calor. Pus o mal olor. Indicaciones generales [place] o retire la venda como se lo haya indicado el mdico. Retome sus actividades normales como se lo haya indicado el mdico. Pregntele al mdico qu actividades son seguras para usted. No tome baos de inmersin, no nade ni use el jacuzzi hasta que el mdico lo autorice. Pregntele al mdico si puede ducharse. Concurra a todas las visitas de seguimiento. Esto es importante. Comunquese con un mdico si: Siente un dolor de cabeza que no se alivia con medicamentos. Tiene ronchas, una erupcin cutnea o picazn en la piel. Tiene nuseas o vmitos. Se siente cansado o dbil de un modo que no es habitual. Tiene signos de infeccin en el lugar de la va intravenosa (i.v.). Solicite ayuda de inmediato si: Tiene fiebre o escalofros. Orina con menos frecuencia que lo habitual. La orina es de un color ms oscuro que lo normal. Tiene alguno de los siguientes sntomas: Dificultad para respirar. Dolor en la espalda, el abdomen o el pecho. Piel fra y hmeda. Latidos cardacos acelerados. Resumen Las plaquetas son fragmentos diminutos de  clulas sanguneas que se agrupan para formar un cogulo de sangre cuando se produce una herida. Si tiene muy pocas plaquetas, es posible que su sangre tenga problemas para coagular. La transfusin de plaquetas es un procedimiento en el cual se reciben plaquetas de un donante a travs de una va intravenosa. Se puede usar una transfusin de plaquetas para detener o evitar el sangrado excesivo. Despus del procedimiento, controle el lugar de la va intravenosa (i.v.) todos los das para descartar signos de infeccin. Esta informacin no tiene como fin reemplazar el consejo del mdico. Asegrese de hacerle al mdico cualquier pregunta que tenga. Document Revised: 08/12/2020 Document Reviewed: 08/12/2020 Elsevier Patient Education  2023 Elsevier Inc.  

## 2022-06-16 NOTE — Telephone Encounter (Signed)
Verbal order given to administer 1 unit of Platelets from Dr.Feng, spoke with Tresa Endo in blood bank type and screen order received, patient will have 1 unit of Platelets transfused on 06/16/2022

## 2022-06-17 LAB — BPAM PLATELET PHERESIS
Blood Product Expiration Date: 202405252359
ISSUE DATE / TIME: 202405230937
Unit Type and Rh: 600

## 2022-06-17 LAB — PREPARE PLATELET PHERESIS

## 2022-06-21 ENCOUNTER — Inpatient Hospital Stay: Payer: Medicare Other

## 2022-06-21 ENCOUNTER — Other Ambulatory Visit: Payer: Self-pay

## 2022-06-21 ENCOUNTER — Telehealth: Payer: Self-pay

## 2022-06-21 DIAGNOSIS — Z5111 Encounter for antineoplastic chemotherapy: Secondary | ICD-10-CM | POA: Diagnosis not present

## 2022-06-21 DIAGNOSIS — D649 Anemia, unspecified: Secondary | ICD-10-CM

## 2022-06-21 DIAGNOSIS — D46Z Other myelodysplastic syndromes: Secondary | ICD-10-CM

## 2022-06-21 DIAGNOSIS — C50212 Malignant neoplasm of upper-inner quadrant of left female breast: Secondary | ICD-10-CM

## 2022-06-21 LAB — BPAM PLATELET PHERESIS
Blood Product Expiration Date: 202405292359
ISSUE DATE / TIME: 202405280848
Unit Type and Rh: 5100

## 2022-06-21 LAB — CBC WITH DIFFERENTIAL (CANCER CENTER ONLY)
Abs Immature Granulocytes: 0 10*3/uL (ref 0.00–0.07)
Basophils Absolute: 0 10*3/uL (ref 0.0–0.1)
Basophils Relative: 1 %
Eosinophils Absolute: 0 10*3/uL (ref 0.0–0.5)
Eosinophils Relative: 1 %
HCT: 24 % — ABNORMAL LOW (ref 36.0–46.0)
Hemoglobin: 8.7 g/dL — ABNORMAL LOW (ref 12.0–15.0)
Immature Granulocytes: 0 %
Lymphocytes Relative: 86 %
Lymphs Abs: 1 10*3/uL (ref 0.7–4.0)
MCH: 30.1 pg (ref 26.0–34.0)
MCHC: 36.3 g/dL — ABNORMAL HIGH (ref 30.0–36.0)
MCV: 83 fL (ref 80.0–100.0)
Monocytes Absolute: 0.1 10*3/uL (ref 0.1–1.0)
Monocytes Relative: 4 %
Neutro Abs: 0.1 10*3/uL — CL (ref 1.7–7.7)
Neutrophils Relative %: 8 %
Platelet Count: 9 10*3/uL — CL (ref 150–400)
RBC: 2.89 MIL/uL — ABNORMAL LOW (ref 3.87–5.11)
RDW: 14.1 % (ref 11.5–15.5)
WBC Count: 1.2 10*3/uL — ABNORMAL LOW (ref 4.0–10.5)
nRBC: 0 % (ref 0.0–0.2)

## 2022-06-21 LAB — SAMPLE TO BLOOD BANK

## 2022-06-21 LAB — PREPARE PLATELET PHERESIS

## 2022-06-21 MED ORDER — DIPHENHYDRAMINE HCL 25 MG PO CAPS
25.0000 mg | ORAL_CAPSULE | Freq: Once | ORAL | Status: AC
  Administered 2022-06-21: 25 mg via ORAL
  Filled 2022-06-21: qty 1

## 2022-06-21 MED ORDER — SODIUM CHLORIDE 0.9% IV SOLUTION
250.0000 mL | Freq: Once | INTRAVENOUS | Status: AC
  Administered 2022-06-21: 250 mL via INTRAVENOUS

## 2022-06-21 MED ORDER — ACETAMINOPHEN 325 MG PO TABS
650.0000 mg | ORAL_TABLET | Freq: Once | ORAL | Status: AC
  Administered 2022-06-21: 650 mg via ORAL
  Filled 2022-06-21: qty 2

## 2022-06-21 NOTE — Telephone Encounter (Signed)
CRITICAL VALUE STICKER  CRITICAL VALUE: Plt. 9, ANC 0.1, Hgb. 8.7  RECEIVER (on-site recipient of call): Sharlette Dense CMA  DATE & TIME NOTIFIED: 06/21/2022 0757  MESSENGER (representative from lab): Pam in Lab  MD NOTIFIED: Dr. Mosetta Putt  TIME OF NOTIFICATION: 7829  RESPONSE: Infuse 1 units of Platelets per Dr. Mosetta Putt

## 2022-06-21 NOTE — Progress Notes (Signed)
Verbal order given to administer 1 unit of Platelet from Dr. Mosetta Putt, spoke with [place] in blood bank type and screen order received, patient will have 1 unit of Platelets transfused on 06/21/2022.

## 2022-06-21 NOTE — Patient Instructions (Signed)
Transfusin de plaquetas Platelet Transfusion La transfusin de plaquetas es un procedimiento en el cual una persona recibe plaquetas de un donante a travs de una va intravenosa. Las plaquetas son fragmentos de la sangre que se unen y forman un cogulo para ayudar a que el cuerpo deje de Geophysicist/field seismologist despus de una lesin. Si tiene [employer], es posible que su sangre tenga problemas para Biochemist, clinical. Esto puede causar sangrado y moretones con mucha facilidad. Tal vez deba recibir una transfusin de plaquetas si tiene una enfermedad que reduce el nmero de plaquetas (trombocitopenia). Se puede usar una transfusin de plaquetas para detener o evitar el sangrado excesivo. Informe al mdico acerca de lo siguiente: Las reacciones que haya tenido durante transfusiones previas. Cualquier alergia que tenga. Todos los [employer] [place], incluidos vitaminas, hierbas, gotas oftlmicas, cremas y [address] de [address]. Cualquier problema de la sangre que tenga. Cirugas a las que se haya sometido. Cualquier afeccin mdica que tenga. Si est embarazada o podra estarlo. Cules son los riesgos? En general, se trata de un procedimiento seguro. No obstante, pueden ocurrir complicaciones, por ejemplo: Grant Ruts. Infeccin. Reaccin alrgica a las plaquetas donadas (de un donante). Que el sistema de su cuerpo que combate las enfermedades (sistema inmunitario) ataque las plaquetas del donante (reaccin hemoltica). Esto es poco frecuente. Una reaccin poco frecuente que causa dao pulmonar (lesin pulmonar aguda producida por transfusin). Qu ocurre antes del procedimiento? Medicamentos Consulte al mdico si debe hacer o no lo siguiente: Multimedia programmer o suspender los medicamentos que [place] habitualmente. Esto es muy importante si toma medicamentos para la diabetes o anticoagulantes. Tomar medicamentos como aspirina e ibuprofeno. Estos medicamentos pueden tener un efecto anticoagulante en la [place]. No tome  estos medicamentos a menos que el mdico se lo indique. Usar medicamentos de venta libre, vitaminas, hierbas y suplementos. Indicaciones generales Se le realizar un anlisis de sangre para determinar su grupo sanguneo. Su tipo de sangre determina qu tipo de plaquetas recibir. Siga las instrucciones del mdico respecto de las restricciones en las comidas o las bebidas. Si tuvo una reaccin alrgica a una transfusin en el pasado, tal vez le administren un medicamento que ayude a evitarla. Le controlarn la temperatura, la presin arterial, el pulso y la respiracin. Qu ocurre durante el procedimiento?  Le colocarn una va intravenosa (i.v.) en una vena. Por su seguridad, dos mdicos verificarn su identidad y las plaquetas del donante que se le van a infundir. Una bolsa con las plaquetas del donante se conectar a su va intravenosa. Las plaquetas ingresarn en su torrente sanguneo. Esto generalmente demora entre 30 y 60 minutos. Durante la transfusin, le controlarn la temperatura, la presin arterial, el pulso y la respiracin. Esto ayuda a detectar signos tempranos de una reaccin. Tambin lo controlarn para Engineer, manufacturing otros sntomas que puedan indicar una reaccin, como escalofros, urticaria o picazn. Si tiene signos de Astronomer, se suspender la transfusin y tal vez le administren un medicamento para Animator. Una vez finalizada la transfusin, se retirar la va intravenosa. Se puede aplicar presin en el lugar de la va intravenosa (i.v.) para detener cualquier sangrado. El lugar de la va intravenosa (i.v.) se cubrir con un vendaje (venda). El procedimiento puede variar segn el mdico y el hospital. Qu puedo esperar despus del procedimiento? Le controlarn la presin arterial, la temperatura, el pulso y la respiracin hasta que abandone el hospital o la clnica. Es posible que tenga algunos moretones y Pension scheme manager de la  va  intravenosa (i.v.). Siga estas indicaciones en su casa: Medicamentos Use los medicamentos de venta libre y los recetados solamente como se lo haya indicado el mdico. Hable con el mdico antes de tomar cualquier medicamento que contenga aspirina o antiinflamatorios no esteroideos (AINE), como el ibuprofeno. Estos medicamentos aumentan el riesgo de tener una hemorragia peligrosa. Cuidado del lugar de la va intravenosa (i.v.) Controle el lugar de la va intravenosa (i.v.) [employer] para descartar signos de infeccin. Est atento a los siguientes signos: Enrojecimiento, Optician, dispensing. Lquido o sangre. Si sale lquido o sangre del Environmental consultant de la va intravenosa (i.v.), ejerza presin firme con las manos sobre el vendaje que cubre la zona durante uno o dos minutos. Al hacerlo, debera detenerse el sangrado. Calor. Pus o mal olor. Indicaciones generales [place] o retire Secondary school teacher se lo haya indicado el mdico. Retome sus actividades normales como se lo haya indicado el mdico. Pregntele al mdico qu actividades son seguras para usted. No tome baos de inmersin, no nade ni use el jacuzzi hasta que el mdico lo autorice. Pregntele al mdico si puede ducharse. Concurra a todas las visitas de seguimiento. Esto es importante. Comunquese con un mdico si: Siente un dolor de cabeza que no se alivia con medicamentos. Tiene ronchas, una erupcin cutnea o picazn en la piel. Tiene nuseas o vmitos. Se siente cansado o dbil de un modo que no es habitual. Tiene signos de infeccin en el lugar de la va intravenosa (i.v.). Solicite ayuda de inmediato si: Tiene fiebre o escalofros. Orina con menos frecuencia que lo habitual. La orina es de un color ms oscuro que lo normal. Tiene alguno de los siguientes sntomas: Dificultad para Industrial/product designer. Dolor en la espalda, el abdomen o el pecho. Piel fra y hmeda. Latidos cardacos acelerados. Resumen Las plaquetas son fragmentos diminutos de  clulas sanguneas que se agrupan para formar un cogulo de sangre cuando se produce una herida. Si tiene [employer], es posible que su sangre tenga problemas para Biochemist, clinical. La transfusin de plaquetas es un procedimiento en el cual se reciben plaquetas de un donante a travs de una va intravenosa. Se puede usar una transfusin de plaquetas para detener o evitar el sangrado excesivo. Despus del procedimiento, controle el lugar de la va intravenosa (i.v.) todos los das para descartar signos de infeccin. Esta informacin no tiene Theme park manager el consejo del mdico. Asegrese de hacerle al mdico cualquier pregunta que tenga. Document Revised: 08/12/2020 Document Reviewed: 08/12/2020 Elsevier Patient Education  2024 [employer].

## 2022-06-22 LAB — PREPARE PLATELET PHERESIS: Unit division: 0

## 2022-06-22 LAB — BPAM PLATELET PHERESIS

## 2022-06-23 ENCOUNTER — Inpatient Hospital Stay: Payer: Medicare Other

## 2022-06-23 ENCOUNTER — Telehealth: Payer: Self-pay

## 2022-06-23 ENCOUNTER — Encounter: Payer: Self-pay | Admitting: Hematology

## 2022-06-23 ENCOUNTER — Inpatient Hospital Stay (HOSPITAL_BASED_OUTPATIENT_CLINIC_OR_DEPARTMENT_OTHER): Payer: Medicare Other | Admitting: Hematology

## 2022-06-23 ENCOUNTER — Other Ambulatory Visit: Payer: Self-pay

## 2022-06-23 VITALS — BP 140/43 | HR 72 | Temp 98.6°F | Resp 16 | Ht 60.0 in | Wt 120.4 lb

## 2022-06-23 DIAGNOSIS — D46Z Other myelodysplastic syndromes: Secondary | ICD-10-CM

## 2022-06-23 DIAGNOSIS — D649 Anemia, unspecified: Secondary | ICD-10-CM

## 2022-06-23 DIAGNOSIS — C50212 Malignant neoplasm of upper-inner quadrant of left female breast: Secondary | ICD-10-CM | POA: Diagnosis not present

## 2022-06-23 DIAGNOSIS — D61818 Other pancytopenia: Secondary | ICD-10-CM

## 2022-06-23 DIAGNOSIS — Z170 Estrogen receptor positive status [ER+]: Secondary | ICD-10-CM

## 2022-06-23 DIAGNOSIS — Z5111 Encounter for antineoplastic chemotherapy: Secondary | ICD-10-CM | POA: Diagnosis not present

## 2022-06-23 LAB — CBC WITH DIFFERENTIAL (CANCER CENTER ONLY)
Abs Immature Granulocytes: 0.01 10*3/uL (ref 0.00–0.07)
Basophils Absolute: 0 10*3/uL (ref 0.0–0.1)
Basophils Relative: 0 %
Eosinophils Absolute: 0 10*3/uL (ref 0.0–0.5)
Eosinophils Relative: 1 %
HCT: 23.1 % — ABNORMAL LOW (ref 36.0–46.0)
Hemoglobin: 8.2 g/dL — ABNORMAL LOW (ref 12.0–15.0)
Immature Granulocytes: 1 %
Lymphocytes Relative: 85 %
Lymphs Abs: 1 10*3/uL (ref 0.7–4.0)
MCH: 29.6 pg (ref 26.0–34.0)
MCHC: 35.5 g/dL (ref 30.0–36.0)
MCV: 83.4 fL (ref 80.0–100.0)
Monocytes Absolute: 0.1 10*3/uL (ref 0.1–1.0)
Monocytes Relative: 5 %
Neutro Abs: 0.1 10*3/uL — CL (ref 1.7–7.7)
Neutrophils Relative %: 8 %
Platelet Count: 15 10*3/uL — ABNORMAL LOW (ref 150–400)
RBC: 2.77 MIL/uL — ABNORMAL LOW (ref 3.87–5.11)
RDW: 14.1 % (ref 11.5–15.5)
Smear Review: NORMAL
WBC Count: 1.2 10*3/uL — ABNORMAL LOW (ref 4.0–10.5)
nRBC: 0 % (ref 0.0–0.2)

## 2022-06-23 LAB — TYPE AND SCREEN
ABO/RH(D): O POS
Antibody Screen: NEGATIVE

## 2022-06-23 LAB — BPAM PLATELET PHERESIS
Blood Product Expiration Date: 202406012359
Blood Product Expiration Date: 202406012359

## 2022-06-23 LAB — PREPARE PLATELET PHERESIS
Unit division: 0
Unit division: 0

## 2022-06-23 LAB — SAMPLE TO BLOOD BANK

## 2022-06-23 MED ORDER — ACETAMINOPHEN 325 MG PO TABS
650.0000 mg | ORAL_TABLET | Freq: Once | ORAL | Status: AC
  Administered 2022-06-23: 650 mg via ORAL
  Filled 2022-06-23: qty 2

## 2022-06-23 MED ORDER — SODIUM CHLORIDE 0.9% IV SOLUTION
250.0000 mL | Freq: Once | INTRAVENOUS | Status: AC
  Administered 2022-06-23: 250 mL via INTRAVENOUS

## 2022-06-23 MED ORDER — DIPHENHYDRAMINE HCL 25 MG PO CAPS
25.0000 mg | ORAL_CAPSULE | Freq: Once | ORAL | Status: AC
  Administered 2022-06-23: 25 mg via ORAL
  Filled 2022-06-23: qty 1

## 2022-06-23 NOTE — Patient Instructions (Signed)
Transfusin de plaquetas Platelet Transfusion La transfusin de plaquetas es un procedimiento en el cual una persona recibe plaquetas de un donante a travs de una va intravenosa. Las plaquetas son fragmentos de la sangre que se unen y forman un cogulo para ayudar a que el cuerpo deje de sangrar despus de una lesin. Si tiene muy pocas plaquetas, es posible que su sangre tenga problemas para coagular. Esto puede causar sangrado y moretones con mucha facilidad. Tal vez deba recibir una transfusin de plaquetas si tiene una enfermedad que reduce el nmero de plaquetas (trombocitopenia). Se puede usar una transfusin de plaquetas para detener o evitar el sangrado excesivo. Informe al mdico acerca de lo siguiente: Las reacciones que haya tenido durante transfusiones previas. Cualquier alergia que tenga. Todos los medicamentos que usa, incluidos vitaminas, hierbas, gotas oftlmicas, cremas y medicamentos de venta libre. Cualquier problema de la sangre que tenga. Cirugas a las que se haya sometido. Cualquier afeccin mdica que tenga. Si est embarazada o podra estarlo. Cules son los riesgos? En general, se trata de un procedimiento seguro. No obstante, pueden ocurrir complicaciones, por ejemplo: Fiebre. Infeccin. Reaccin alrgica a las plaquetas donadas (de un donante). Que el sistema de su cuerpo que combate las enfermedades (sistema inmunitario) ataque las plaquetas del donante (reaccin hemoltica). Esto es poco frecuente. Una reaccin poco frecuente que causa dao pulmonar (lesin pulmonar aguda producida por transfusin). Qu ocurre antes del procedimiento? Medicamentos Consulte al mdico si debe hacer o no lo siguiente: Cambiar o suspender los medicamentos que usa habitualmente. Esto es muy importante si toma medicamentos para la diabetes o anticoagulantes. Tomar medicamentos como aspirina e ibuprofeno. Estos medicamentos pueden tener un efecto anticoagulante en la sangre. No tome  estos medicamentos a menos que el mdico se lo indique. Usar medicamentos de venta libre, vitaminas, hierbas y suplementos. Indicaciones generales Se le realizar un anlisis de sangre para determinar su grupo sanguneo. Su tipo de sangre determina qu tipo de plaquetas recibir. Siga las instrucciones del mdico respecto de las restricciones en las comidas o las bebidas. Si tuvo una reaccin alrgica a una transfusin en el pasado, tal vez le administren un medicamento que ayude a evitarla. Le controlarn la temperatura, la presin arterial, el pulso y la respiracin. Qu ocurre durante el procedimiento?  Le colocarn una va intravenosa (i.v.) en una vena. Por su seguridad, dos mdicos verificarn su identidad y las plaquetas del donante que se le van a infundir. Una bolsa con las plaquetas del donante se conectar a su va intravenosa. Las plaquetas ingresarn en su torrente sanguneo. Esto generalmente demora entre 30 y 60 minutos. Durante la transfusin, le controlarn la temperatura, la presin arterial, el pulso y la respiracin. Esto ayuda a detectar signos tempranos de una reaccin. Tambin lo controlarn para detectar otros sntomas que puedan indicar una reaccin, como escalofros, urticaria o picazn. Si tiene signos de una reaccin en algn momento, se suspender la transfusin y tal vez le administren un medicamento para controlar la reaccin. Una vez finalizada la transfusin, se retirar la va intravenosa. Se puede aplicar presin en el lugar de la va intravenosa (i.v.) para detener cualquier sangrado. El lugar de la va intravenosa (i.v.) se cubrir con un vendaje (venda). El procedimiento puede variar segn el mdico y el hospital. Qu puedo esperar despus del procedimiento? Le controlarn la presin arterial, la temperatura, el pulso y la respiracin hasta que abandone el hospital o la clnica. Es posible que tenga algunos moretones y sienta dolor en el lugar de la   va  intravenosa (i.v.). Siga estas indicaciones en su casa: Medicamentos Use los medicamentos de venta libre y los recetados solamente como se lo haya indicado el mdico. Hable con el mdico antes de tomar cualquier medicamento que contenga aspirina o antiinflamatorios no esteroideos (AINE), como el ibuprofeno. Estos medicamentos aumentan el riesgo de tener una hemorragia peligrosa. Cuidado del lugar de la va intravenosa (i.v.) Controle el lugar de la va intravenosa (i.v.) todos los das para descartar signos de infeccin. Est atento a los siguientes signos: Enrojecimiento, hinchazn o dolor. Lquido o sangre. Si sale lquido o sangre del lugar de la va intravenosa (i.v.), ejerza presin firme con las manos sobre el vendaje que cubre la zona durante uno o dos minutos. Al hacerlo, debera detenerse el sangrado. Calor. Pus o mal olor. Indicaciones generales Cambie o retire la venda como se lo haya indicado el mdico. Retome sus actividades normales como se lo haya indicado el mdico. Pregntele al mdico qu actividades son seguras para usted. No tome baos de inmersin, no nade ni use el jacuzzi hasta que el mdico lo autorice. Pregntele al mdico si puede ducharse. Concurra a todas las visitas de seguimiento. Esto es importante. Comunquese con un mdico si: Siente un dolor de cabeza que no se alivia con medicamentos. Tiene ronchas, una erupcin cutnea o picazn en la piel. Tiene nuseas o vmitos. Se siente cansado o dbil de un modo que no es habitual. Tiene signos de infeccin en el lugar de la va intravenosa (i.v.). Solicite ayuda de inmediato si: Tiene fiebre o escalofros. Orina con menos frecuencia que lo habitual. La orina es de un color ms oscuro que lo normal. Tiene alguno de los siguientes sntomas: Dificultad para respirar. Dolor en la espalda, el abdomen o el pecho. Piel fra y hmeda. Latidos cardacos acelerados. Resumen Las plaquetas son fragmentos diminutos de  clulas sanguneas que se agrupan para formar un cogulo de sangre cuando se produce una herida. Si tiene muy pocas plaquetas, es posible que su sangre tenga problemas para coagular. La transfusin de plaquetas es un procedimiento en el cual se reciben plaquetas de un donante a travs de una va intravenosa. Se puede usar una transfusin de plaquetas para detener o evitar el sangrado excesivo. Despus del procedimiento, controle el lugar de la va intravenosa (i.v.) todos los das para descartar signos de infeccin. Esta informacin no tiene como fin reemplazar el consejo del mdico. Asegrese de hacerle al mdico cualquier pregunta que tenga. Document Revised: 08/12/2020 Document Reviewed: 08/12/2020 Elsevier Patient Education  2024 Elsevier Inc.  

## 2022-06-23 NOTE — Progress Notes (Signed)
[facility]   [phone] [phone]   Clinic Follow up Note   Patient Care Team: Malachy Mood, MD as PCP - General (Hematology) Malachy Mood, MD as Consulting Physician (Hematology) Claud Kelp, MD as Consulting Physician (General Surgery) Pollyann Samples, NP as Nurse Practitioner (Nurse Practitioner) Antony Blackbird, MD as Consulting Physician (Radiation Oncology) Earl Lites, MD as Referring Physician (Hematology and Oncology) Riley Churches, RN as [facility] Care Management Rosaria Ferries, Orpah Clinton, MD as Referring Physician (Hematology and Oncology)  Date of Service:  06/23/2022  CHIEF COMPLAINT: f/u of  MDS   CURRENT THERAPY:  Azacitadine injection, days 1-5, started on 11/15/21  (first cycle 7 days); C2 x4 days, subsequent cycles daily x5 days  Platelet transfusion if platelet count less than 20 K Blood transfusion if hemoglobin less than 8.0  ASSESSMENT:  Amanda Duarte is a 67 y.o. 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  Her FISH test showed 3 abnormal type, her risk of transforming to acute leukemia is very high, likely in the next 6 to 12 months -She started azacitidine injection on November 15, 2021, she was admitted to hospital after chemo, for severe profound pancytopenia, aspiration pneumonia, required intubation. She has recovered well  -Her blood counts improved after first cycle chemotherapy, likely she responded to treatment. -she started cycle 2 azacitidine on 01/25/2022, will reduce to 4 days for this cycle. If she tolerates well, will change to 5 days from next cyle  -She was evaluated for bone marrow transplant at [facility] and is open to transplant. We recommend continuing chemo for now, and reserve  transplant when she progress. She is tolerating Azacitadine well.   -She has been more cytopenic lately, concerning for disease progression.  I held her chemo last week. -Repeated a bone marrow biopsy on May 23, 2022 showed persistent high-grade MDS, with increased blasts 6%, no evidence of AML transformation at this point  -I reviewed her recent worsening pancytopenia with Dr. Rosaria Ferries, and he recommend her to see his leukemia specialist Dr. Sharyne Richters, I will refer   Malignant neoplasm of upper-inner quadrant of left breast in female, estrogen receptor negative (HCC) -cTxN2aM0, G3, ER, PR and HER-2 negative (triple negative), [id number]  -diagnosed in 04/2017, s/p neoadjuvant ddAC-TC, left mastectomy and adjuvant radiation. She had excellent response. -most recent right mammogram on 07/26/21 and right axilla [name] on 10/27/21 were benign. -she is on surveillance now     Other pancytopenia (HCC) -secondary to MDS -will continue prophylactic antibiotics for ANC less than 0.5, and blood transfusion as needed     PLAN: -lab reviewed hg 8.7 from two days ago - recent worsening pancytopenia - Pt is not responding well to the current treatment -I recommend her to see leukemia specialist Dr. Sharyne Richters, I will refer  -Continue to come twice a week  for blood transfusion -lab and treatment 6/3 -f/u in 3 weeks  SUMMARY OF ONCOLOGIC HISTORY: Oncology History Overview Note  Cancer Staging Malignant neoplasm of upper-inner quadrant of left breast in female, estrogen receptor positive (HCC) Staging form: Breast, AJCC 8th Edition - Clinical stage from 05/01/2017: Stage Unknown (cTX, cN2, cM0, G3, ER-, PR-, HER2-) - Signed by Malachy Mood, MD on 11/01/2017 - Pathologic stage from 11/20/2017: No Stage Recommended (ypT0, pN38mi, cM0, GX, ER-,  PR-, HER2-) - Signed by Malachy Mood, MD on 12/10/2017     Malignant neoplasm of upper-inner quadrant of left breast in female, estrogen receptor negative (HCC)  04/28/2017  Mammogram   IMPRESSION: Two adjacent masses/enlarged lymph nodes in the LOWER LEFT axilla, the largest measuring 2.1 cm. Tissue sampling of 1 of these is recommended to exclude malignancy/lymphoma. No mammographic evidence of breast malignancy bilaterally.    05/01/2017 Initial Biopsy   Diagnosis 05/01/17 Lymph node, needle/core biopsy, low left inferior axillary - METASTATIC POORLY DIFFERENTIATED CARCINOMA TO A LYMPH NODE. SEE NOTE.   05/01/2017 Cancer Staging   Staging form: Breast, AJCC 8th Edition - Clinical stage from 05/01/2017: Stage Unknown (cTX, cN1, cM0, G3, ER-, PR-, HER2-) - Signed by Malachy Mood, MD on 06/02/2017    05/01/2017 Receptors her2   Lymph Node Biopsy:  HER2-Negative  PR-Negative  ER- Negative    05/10/2017 Imaging   MR Breast W WO Contrast 05/10/17 IMPRESSION: No MRI evidence of malignancy in the right breast. Area of week stippled non mass enhancement in the left breast upper inner quadrant. Separate area of thin linear non mass enhancement in the subareolar left breast. Three grossly abnormal left axillary lymph nodes, and more than 4 less than 1 cm indeterminate left axillary lymph nodes. No evidence of right axillary lymphadenopathy.   05/17/2017 Initial Biopsy   Diagnosis 05/17/17 1. 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 2. Breast, left, needle core biopsy, upper inner post MR enhancement - MICROSCOPIC FOCUS OF DUCTAL CARCINOMA IN SITU.   05/17/2017 Receptors her2   Left Breast Biopsy:  ER-Negative PR-Negative HER2-Negative   05/22/2017 Initial Diagnosis   Malignant neoplasm of upper-inner quadrant of left breast in female, estrogen receptor positive (HCC)   06/01/2017 Imaging   Boen Scan 06/01/17 IMPRESSION: No definite scintigraphic evidence of osseous metastatic disease.   Posttraumatic and postsurgical uptake at the LEFT knee.   Nonspecific soft tissue  distribution of tracer at the LEFT thigh, could represent contusion, hemorrhage, soft tissue edema, or soft tissue calcifications such as from heterotopic calcification and myositis ossificans; recommend clinical correlation and consider dedicated LEFT femoral radiographs.   Single focus of nonspecific increased tracer localization at the lateral LEFT orbit.     06/01/2017 Imaging   06/01/2017 Bone Scan IMPRESSION: No definite scintigraphic evidence of osseous metastatic disease.   Posttraumatic and postsurgical uptake at the LEFT knee.   Nonspecific soft tissue distribution of tracer at the LEFT thigh, could represent contusion, hemorrhage, soft tissue edema, or soft tissue calcifications such as from heterotopic calcification and myositis ossificans; recommend clinical correlation and consider dedicated LEFT femoral radiographs.   Single focus of nonspecific increased tracer localization at the lateral LEFT orbit.   06/09/2017 -  Chemotherapy   ddAC every 2 weeks for 4 weeks starting 06/09/17-07/21/17 followed by weekly [place] and taxol for 12 weeks 08/04/17-10/20/17.    10/24/2017 Imaging   Breast MRI B/l 10/24/17 IMPRESSION: 1. No residual enhancement in the LEFT breast following neoadjuvant treatment. 2. Significantly smaller LEFT axillary lymph nodes, largest now measuring 1.4 centimeters. The remainder of LEFT axillary lymph nodes demonstrate normal fatty hila. RECOMMENDATION: Treatment plan for known LEFT breast cancer.   11/20/2017 Cancer Staging   Staging form: Breast, AJCC 8th Edition - Pathologic stage from 11/20/2017: No Stage Recommended (ypT0, pN50mi, cM0, GX, ER-, PR-, HER2-) - Signed by Malachy Mood, MD on 12/10/2017   11/20/2017 Surgery  Left mastectomy and SLN biopsy by Dr. Derrell Lolling    11/20/2017 Pathology Results   Breast, modified radical mastectomy , left - MICROSCOPIC FOCI OF RESIDUAL METASTATIC CARCINOMA INVOLVING TWO OF EIGHT LYMPH NODES (2/8); LARGEST  CONTINUOUS FOCUS MEASURES 0.1 CM. - NO EVIDENCE OF RESIDUAL CARCINOMA IN THE MASTECTOMY SPECIMEN - MARKED THERAPY-RELATED CHANGES, INCLUDING DENSE HYALIN FIBROSIS     11/20/2017 Receptors her2   ER- PR- HER2- (IHC 1+)   01/02/2018 - 02/14/2018 Radiation Therapy   Adjuvant Radiation 01/02/18 - 02/14/18   07/31/2018 Imaging   Baseline DEXA 07/31/18  ASSESSMENT: The BMD measured at AP Spine L1-L4 is 0.973 g/cm2 with a T-score of -1.7.   This patient is considered OSTEOPENIC according to World Health Organization [facility]) criteria. The scan quality is good.   Site Region Measured Date Measured Age YA T-score BMD Significant CHANGE   AP Spine  L1-L4      07/31/2018    63.1         -1.7    0.973 g/cm2   DualFemur Neck Left  07/31/2018    63.1         -1.5    0.828 g/cm2   DualFemur Total Mean 07/31/2018    63.1         0.1     1.016 g/cm2   11/05/2018 Survivorship   Per Santiago Glad, NP    11/15/2021 -  Chemotherapy   Patient is on Treatment Plan : MYELODYSPLASIA  Azacitidine SQ D1-7 q28d     02/03/2022 Imaging    IMPRESSION: No active cardiopulmonary disease. No evidence of pneumonia or pulmonary edema. Possible mild chronic interstitial lung disease.      INTERVAL HISTORY:  Amanda Duarte is here for a follow up of  MDS  She was last seen by me on 05/26/2022. She presents to the clinic with interpreter,. Pt has no clinical issues today.     All other systems were reviewed with the patient and are negative.  MEDICAL HISTORY:  Past Medical History:  Diagnosis Date   Cancer (HCC) 03/2017   left breast cancer   Diabetes mellitus without complication (HCC)    Hyperlipidemia    Hypertension    Malignant neoplasm of left breast (HCC)    Stroke (cerebrum) (HCC)    Tibial plateau fracture, left    04-13-17 had ORIF    SURGICAL HISTORY: Past Surgical History:  Procedure Laterality Date   MASTECTOMY Left 2019   MASTECTOMY MODIFIED RADICAL Left 11/20/2017   Procedure: LEFT  MODIFIED RADICAL MASTECTOMY;  Surgeon: Claud Kelp, MD;  Location: [facility] OR;  Service: General;  Laterality: Left;   ORIF TIBIA PLATEAU Left 04/13/2017   Procedure: OPEN REDUCTION INTERNAL FIXATION (ORIF) TIBIAL PLATEAU;  Surgeon: Roby Lofts, MD;  Location: [facility] OR;  Service: Orthopedics;  Laterality: Left;   PORT-A-CATH REMOVAL Right 11/20/2017   Procedure: REMOVAL PORT-A-CATH;  Surgeon: Claud Kelp, MD;  Location: [facility] OR;  Service: General;  Laterality: Right;   PORTACATH PLACEMENT Right 05/31/2017   Procedure: INSERTION PORT-A-CATH;  Surgeon: Claud Kelp, MD;  Location: [facility];  Service: General;  Laterality: Right;    I have reviewed the social history and family history with the patient and they are unchanged from previous note.  ALLERGIES:  is allergic to victoza [liraglutide].  MEDICATIONS:  Current Outpatient Medications  Medication Sig Dispense Refill   amLODipine (NORVASC) 5 MG tablet Take 1 tablet (5 mg total) by mouth daily. For blood pressure 90  tablet 1   atorvastatin (LIPITOR) 40 MG tablet Take 1 tablet (40 mg total) by mouth daily. For cholesterol 90 tablet 2   Blood Pressure Monitor DEVI Please provide patient with insurance approved blood pressure monitor 1 each 0   citalopram (CELEXA) 20 MG tablet Take 1 tablet (20 mg total) by mouth daily. For depression 90 tablet 3   Continuous Glucose Receiver (FREESTYLE LIBRE READER) DEVI Monitor blood glucose levels 5-6 times per day E11.65 1 each 0   Continuous Glucose Sensor (FREESTYLE LIBRE 2 SENSOR) MISC Monitor blood glucose levels 5-6 times per day E11.65 3 each 6   fenofibrate (TRICOR) 145 MG tablet Take 1 tablet (145 mg total) by mouth daily. For cholesterol 90 tablet 2   fluticasone (FLONASE) 50 MCG/ACT nasal spray Place 2 sprays into both nostrils daily. 16 g 6   HUMALOG KWIKPEN 100 UNIT/ML KwikPen Inject 10 Units into the skin 3 (three) times daily. 15 mL 1   Insulin Glargine (BASAGLAR KWIKPEN)  100 UNIT/ML Inject 20 Units into the skin 2 (two) times daily. 15 mL 2   Insulin Pen Needle 31G X 5 MM MISC Inject 1 Device into the skin QID. For use with insulin pens 200 each 6   Insulin Syringe-Needle U-100 30G X 5/16" 1 ML MISC Use as directed to inject into the skin 3 times daily. 200 each 6   levofloxacin (LEVAQUIN) 500 MG tablet Take 1 tablet (500 mg total) by mouth daily. 30 tablet 1   losartan (COZAAR) 50 MG tablet Take 1 tablet (50 mg total) by mouth daily. 90 tablet 1   losartan (COZAAR) 50 MG tablet Take 1 tablet (50 mg total) by mouth daily. 30 tablet 2   ondansetron (ZOFRAN) 8 MG tablet Take 1 tablet (8 mg total) by mouth every 8 (eight) hours as needed for nausea or vomiting. 30 tablet 1   pantoprazole (PROTONIX) 40 MG tablet Take 1 tablet (40 mg total) by mouth daily at 6 (six) AM. For heartburn 90 tablet 1   potassium chloride SA (KLOR-CON M) 20 MEQ tablet Take 1 tablet (20 mEq total) by mouth daily. 30 tablet 3   prochlorperazine (COMPAZINE) 10 MG tablet Take 1 tablet (10 mg total) by mouth every 6 (six) hours as needed for nausea or vomiting. 30 tablet 1   No current facility-administered medications for this visit.   Facility-Administered Medications Ordered in Other Visits  Medication Dose Route Frequency Provider Last Rate Last Admin   heparin lock flush 100 unit/mL  500 Units Intracatheter Once PRN Malachy Mood, MD       sodium chloride flush (NS) 0.9 % injection 10 mL  10 mL Intracatheter PRN Malachy Mood, MD        PHYSICAL EXAMINATION: ECOG PERFORMANCE STATUS: 1 - Symptomatic but completely ambulatory  Vitals:   06/23/22 0809  BP: (!) 140/43  Pulse: 72  Resp: 16  Temp: 98.6 F (37 C)  SpO2: 100%   Wt Readings from Last 3 Encounters:  06/23/22 120 lb 6.4 oz (54.6 kg)  06/16/22 120 lb 8 oz (54.7 kg)  05/30/22 120 lb 4 oz (54.5 kg)     GENERAL:alert, no distress and comfortable SKIN: skin color normal, no rashes or significant lesions EYES: normal, Conjunctiva  are pink and non-injected, sclera clear  NEURO: alert & oriented x 3 with fluent speech   LABORATORY DATA:  I have reviewed the data as listed    Latest Ref Rng & Units 06/23/2022    7:49  AM 06/21/2022    7:39 AM 06/16/2022    8:03 AM  CBC  WBC 4.0 - 10.5 K/uL 1.2  1.2  1.3   Hemoglobin 12.0 - 15.0 g/dL 8.2  8.7  9.3   Hematocrit 36.0 - 46.0 % 23.1  24.0  26.9   Platelets 150 - 400 K/uL 15  9  14          Latest Ref Rng & Units 06/09/2022    7:29 AM 06/02/2022    8:10 AM 05/30/2022    8:06 AM  CMP  Glucose 70 - 99 mg/dL 161  096  045   BUN 8 - 23 mg/dL 13  18  17    Creatinine 0.44 - 1.00 mg/dL 4.09  8.11  9.14   Sodium 135 - 145 mmol/L 136  134  132   Potassium 3.5 - 5.1 mmol/L 3.7  3.9  4.1   Chloride 98 - 111 mmol/L 103  101  100   CO2 22 - 32 mmol/L 26  26  27    Calcium 8.9 - 10.3 mg/dL 8.6  9.1  9.1   Total Protein 6.5 - 8.1 g/dL 7.9  8.3    Total Bilirubin 0.3 - 1.2 mg/dL 0.6  0.5    Alkaline Phos 38 - 126 U/L 213  206    AST 15 - 41 U/L 18  15    ALT 0 - 44 U/L 13  12        RADIOGRAPHIC STUDIES: I have personally reviewed the radiological images as listed and agreed with the findings in the report. No results found.    Orders Placed This Encounter  Procedures   CBC with Differential (Cancer Center Only)    Standing Status:   Future    Standing Expiration Date:   08/08/2023   Basic Metabolic Panel - Cancer Center Only    Standing Status:   Future    Standing Expiration Date:   08/08/2023   All questions were answered. The patient knows to call the clinic with any problems, questions or concerns. No barriers to learning was detected. The total time spent in the appointment was 30 minutes.     Malachy Mood, MD 06/23/2022   Carolin Coy, CMA, am acting as scribe for Malachy Mood, MD.   I have reviewed the above documentation for accuracy and completeness, and I agree with the above.

## 2022-06-23 NOTE — Assessment & Plan Note (Signed)
-  secondary to MDS -will continue prophylactic antibiotics for ANC less than 0.5, and blood transfusion as needed

## 2022-06-23 NOTE — Assessment & Plan Note (Signed)
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  Her FISH test showed 3 abnormal type, her risk of transforming to acute leukemia is very high, likely in the next 6 to 12 months -She started azacitidine injection on November 15, 2021, she was admitted to hospital after chemo, for severe profound pancytopenia, aspiration pneumonia, required intubation. She has recovered well  -Her blood counts improved after first cycle chemotherapy, likely she responded to treatment. -she started cycle 2 azacitidine on 01/25/2022, will reduce to 4 days for this cycle. If she tolerates well, will change to 5 days from next cyle  -She was evaluated for bone marrow transplant at [facility] and is open to transplant. We recommend continuing chemo for now, and reserve transplant when she progress. She is tolerating Azacitadine well.   -She has been more cytopenic lately, concerning for disease progression.  I held her chemo last week. -Repeated a bone marrow biopsy on May 23, 2022 showed persistent high-grade MDS, with increased blasts 6%, no evidence of AML transformation at this point  -I reviewed her recent worsening pancytopenia with Dr. Rosaria Ferries, and he recommend her to see his leukemia specialist Dr. Sharyne Richters, I will refer

## 2022-06-23 NOTE — Telephone Encounter (Signed)
Critical lab values reported:  ANC 0.1; Plts 15, Hbg 8.2 today.  Dr. Mosetta Putt notified.

## 2022-06-23 NOTE — Progress Notes (Signed)
Per verbal order from Dr. Mosetta Putt, please place referral order for ambulatory referral for hematology & oncology to Dr. Vevelyn Francois at [facility].  Verbal order given by Dr. Mosetta Putt to fax referral and referral information to Dr. Sharyne Richters per recommendation from Dr. Rosaria Ferries at [facility].  Faxed referral packet to New Patient Referral Team for Dr. Sharyne Richters [phone]).  Fax confirmation received.

## 2022-06-23 NOTE — Assessment & Plan Note (Signed)
-  cTxN2aM0, G3, ER, PR and HER-2 negative (triple negative), [id number]  -diagnosed in 04/2017, s/p neoadjuvant ddAC-TC, left mastectomy and adjuvant radiation. She had excellent response. -most recent right mammogram on 07/26/21 and right axilla US on 10/27/21 were benign. -she is on surveillance now  

## 2022-06-24 ENCOUNTER — Telehealth: Payer: Self-pay | Admitting: Hematology

## 2022-06-24 LAB — PREPARE PLATELET PHERESIS

## 2022-06-24 LAB — BPAM PLATELET PHERESIS: Unit Type and Rh: 6200

## 2022-06-24 NOTE — Telephone Encounter (Signed)
Scheduled appointments per los. Left a voicemail for the patients daughter.

## 2022-06-26 LAB — BPAM PLATELET PHERESIS
ISSUE DATE / TIME: 202405301012
Unit Type and Rh: 6200

## 2022-06-27 ENCOUNTER — Inpatient Hospital Stay: Payer: Medicare Other | Attending: Hematology

## 2022-06-27 ENCOUNTER — Ambulatory Visit: Payer: Medicare Other | Admitting: Hematology

## 2022-06-27 ENCOUNTER — Other Ambulatory Visit: Payer: Self-pay

## 2022-06-27 ENCOUNTER — Other Ambulatory Visit: Payer: Medicare Other

## 2022-06-27 ENCOUNTER — Ambulatory Visit: Payer: Medicare Other

## 2022-06-27 ENCOUNTER — Telehealth: Payer: Self-pay

## 2022-06-27 ENCOUNTER — Inpatient Hospital Stay: Payer: Medicare Other

## 2022-06-27 VITALS — BP 170/64 | HR 86 | Temp 98.3°F | Resp 18 | Wt 120.8 lb

## 2022-06-27 DIAGNOSIS — C50212 Malignant neoplasm of upper-inner quadrant of left female breast: Secondary | ICD-10-CM

## 2022-06-27 DIAGNOSIS — D46Z Other myelodysplastic syndromes: Secondary | ICD-10-CM

## 2022-06-27 DIAGNOSIS — D469 Myelodysplastic syndrome, unspecified: Secondary | ICD-10-CM | POA: Insufficient documentation

## 2022-06-27 DIAGNOSIS — Z5111 Encounter for antineoplastic chemotherapy: Secondary | ICD-10-CM | POA: Insufficient documentation

## 2022-06-27 DIAGNOSIS — D649 Anemia, unspecified: Secondary | ICD-10-CM

## 2022-06-27 LAB — BASIC METABOLIC PANEL - CANCER CENTER ONLY
Anion gap: 9 (ref 5–15)
BUN: 13 mg/dL (ref 8–23)
CO2: 24 mmol/L (ref 22–32)
Calcium: 9.1 mg/dL (ref 8.9–10.3)
Chloride: 96 mmol/L — ABNORMAL LOW (ref 98–111)
Creatinine: 0.64 mg/dL (ref 0.44–1.00)
GFR, Estimated: >60 mL/min (ref >60)
Glucose, Bld: 584 mg/dL (ref 70–99)
Potassium: 3.7 mmol/L (ref 3.5–5.1)
Sodium: 129 mmol/L — ABNORMAL LOW (ref 135–145)

## 2022-06-27 LAB — CBC WITH DIFFERENTIAL (CANCER CENTER ONLY)
Abs Immature Granulocytes: 0.01 10*3/uL (ref 0.00–0.07)
Basophils Absolute: 0 10*3/uL (ref 0.0–0.1)
Basophils Relative: 1 %
Eosinophils Absolute: 0 10*3/uL (ref 0.0–0.5)
Eosinophils Relative: 1 %
HCT: 22.5 % — ABNORMAL LOW (ref 36.0–46.0)
Hemoglobin: 7.9 g/dL — ABNORMAL LOW (ref 12.0–15.0)
Immature Granulocytes: 1 %
Lymphocytes Relative: 74 %
Lymphs Abs: 0.7 10*3/uL (ref 0.7–4.0)
MCH: 29.5 pg (ref 26.0–34.0)
MCHC: 35.1 g/dL (ref 30.0–36.0)
MCV: 84 fL (ref 80.0–100.0)
Monocytes Absolute: 0.1 10*3/uL (ref 0.1–1.0)
Monocytes Relative: 10 %
Neutro Abs: 0.1 10*3/uL — CL (ref 1.7–7.7)
Neutrophils Relative %: 13 %
Platelet Count: 16 10*3/uL — ABNORMAL LOW (ref 150–400)
RBC: 2.68 MIL/uL — ABNORMAL LOW (ref 3.87–5.11)
RDW: 14.5 % (ref 11.5–15.5)
Smear Review: NORMAL
WBC Count: 1 10*3/uL — ABNORMAL LOW (ref 4.0–10.5)
nRBC: 1.9 % — ABNORMAL HIGH (ref 0.0–0.2)

## 2022-06-27 LAB — TYPE AND SCREEN

## 2022-06-27 LAB — PREPARE PLATELET PHERESIS

## 2022-06-27 LAB — PREPARE RBC (CROSSMATCH)

## 2022-06-27 LAB — SAMPLE TO BLOOD BANK

## 2022-06-27 LAB — BPAM PLATELET PHERESIS

## 2022-06-27 LAB — BPAM RBC: Unit Type and Rh: 5100

## 2022-06-27 MED ORDER — AZACITIDINE CHEMO SQ INJECTION
60.0000 mg/m2 | Freq: Once | INTRAMUSCULAR | Status: AC
  Administered 2022-06-27: 100 mg via SUBCUTANEOUS
  Filled 2022-06-27: qty 4

## 2022-06-27 MED ORDER — ONDANSETRON HCL 8 MG PO TABS
8.0000 mg | ORAL_TABLET | Freq: Once | ORAL | Status: AC
  Administered 2022-06-27: 8 mg via ORAL
  Filled 2022-06-27: qty 1

## 2022-06-27 NOTE — Progress Notes (Signed)
Blood and plts order placed for critical lab values from today's labs.  Pt will get transfusion on 06/28/2022.  Dr. Mosetta Putt notified of critical BG and verbal order given to notify pt's PCP.  Faxed labs to pt's PCP and requested if pt could be seen by PCP this week.  Fax confirmation received.

## 2022-06-27 NOTE — Progress Notes (Signed)
Per Dr. Mosetta Putt, OK to treat with Hgb 7.9, platelets 16.  Patient informed that she will be receiving platelets and blood tomorrow at her infusion appointment in addition to Vidaza.  Patient reminded to bring blue blood bracelet with her tomorrow.  Patient verbalized understanding.

## 2022-06-27 NOTE — Telephone Encounter (Signed)
Critical lab value:  BG 584 today.  Notified Dr. Mosetta Putt.

## 2022-06-27 NOTE — Telephone Encounter (Signed)
Critical lab value reported:  ANC 0.1; plts 16; and Hgb 7.9 today.  Dr. Mosetta Putt notified.

## 2022-06-27 NOTE — Patient Instructions (Signed)
Instrucciones al darle de alta: Discharge Instructions Gracias por elegir al [facility] [place] para brindarle atencin mdica de oncologa y hematologa.   Si usted tiene una cita de laboratorio con el [facility], por favor vaya directamente al [facility] y regstrese en el rea de registro.   Use ropa cmoda y adecuada para tener fcil acceso a las vas del Portacath (acceso venoso de larga duracin) o la lnea PICC (catter central colocado por va perifrica).   Nos esforzamos por ofrecerle tiempo de calidad con su proveedor. Es posible que tenga que volver a programar su cita si llega tarde (15 minutos o ms).  El llegar tarde le afecta a usted y a otros pacientes cuyas citas son posteriores a la suya.  Adems, si usted falta a tres o ms citas sin avisar a la oficina, puede ser retirado(a) de la clnica a discrecin del proveedor.      Para las solicitudes de renovacin de recetas, pida a su farmacia que se ponga en contacto con nuestra oficina y deje que transcurran 72 horas para que se complete el proceso de las renovaciones.    Hoy usted recibi los siguientes agentes de quimioterapia e/o inmunoterapia: Vidaza        Para ayudar a prevenir las nuseas y los vmitos despus de su tratamiento, le recomendamos que tome su medicamento para las nuseas segn las indicaciones.  LOS SNTOMAS QUE DEBEN COMUNICARSE INMEDIATAMENTE SE INDICAN A CONTINUACIN: *FIEBRE SUPERIOR A 100.4 F (38 C) O MS *ESCALOFROS O SUDORACIN *NUSEAS Y VMITOS QUE NO SE CONTROLAN CON EL MEDICAMENTO PARA LAS NUSEAS *DIFICULTAD INUSUAL PARA RESPIRAR  *MORETONES O HEMORRAGIAS NO HABITUALES *PROBLEMAS URINARIOS (dolor o ardor al orinar o frecuencia para orinar) *PROBLEMAS INTESTINALES (diarrea inusual, estreimiento, dolor cerca del ano) SENSIBILIDAD EN LA BOCA Y EN LA GARGANTA CON O SIN LA PRESENCIA DE LCERAS (dolor de garganta, llagas en la boca o dolor de muelas/dientes) ERUPCIN,  HINCHAZN O DOLORES INUSUALES FLUJO VAGINAL INUSUAL O PICAZN/RASQUIA    Los puntos marcados con un asterisco ( *) indican una posible emergencia y debe hacer un seguimiento tan pronto como le sea posible o vaya al Departamento de Emergencias si se le presenta algn problema.  Por favor, muestre la TARJETA DE ADVERTENCIA DE QUIMIOTERAPIA O LA TARJETA DE ADVERTENCIA DE INMUNOTERAPIA al registrarse en el Departamento de Emergencias y a la enfermera de triaje.  Si tiene preguntas despus de su visita o necesita cancelar o volver a programar su cita, por favor pngase en contacto con [facility]  Dept: [phone] y siga las instrucciones. Las horas de oficina son de 8:00 a.m. a 4:30 p.m. de lunes a viernes. Por favor, tenga en cuenta que los mensajes de voz que se dejan despus de las 4:00 p.m. posiblemente no se devolvern hasta el siguiente da de trabajo.  Cerramos los fines de semana y los das festivos importantes. En todo momento tiene acceso a una enfermera para preguntas urgentes. Por favor, llame al nmero principal de la clnica Dept: [phone] y siga las instrucciones.   Para cualquier pregunta que no sea de carcter urgente, tambin puede ponerse en contacto con su proveedor utilizando MyChart. Ahora ofrecemos visitas electrnicas para cualquier persona mayor de 18 aos que solicite atencin mdica en lnea para los sntomas que no sean urgentes. Para ms detalles vaya a [email or web address].   Tambin puede bajar la aplicacin de MyChart! Vaya a la tienda de aplicaciones, busque "MyChart",   abra la aplicacin, seleccione [place], e ingrese con su nombre de usuario y la contrasea de MyChart. 

## 2022-06-28 ENCOUNTER — Ambulatory Visit: Payer: Medicare Other

## 2022-06-28 ENCOUNTER — Inpatient Hospital Stay: Payer: Medicare Other

## 2022-06-28 VITALS — BP 109/48 | HR 72 | Temp 98.2°F | Resp 18

## 2022-06-28 DIAGNOSIS — D649 Anemia, unspecified: Secondary | ICD-10-CM

## 2022-06-28 DIAGNOSIS — D46Z Other myelodysplastic syndromes: Secondary | ICD-10-CM

## 2022-06-28 DIAGNOSIS — Z170 Estrogen receptor positive status [ER+]: Secondary | ICD-10-CM

## 2022-06-28 DIAGNOSIS — Z5111 Encounter for antineoplastic chemotherapy: Secondary | ICD-10-CM | POA: Diagnosis not present

## 2022-06-28 LAB — PREPARE PLATELET PHERESIS: Unit division: 0

## 2022-06-28 LAB — BPAM PLATELET PHERESIS: Blood Product Expiration Date: 202406032359

## 2022-06-28 LAB — TYPE AND SCREEN: ABO/RH(D): O POS

## 2022-06-28 LAB — BPAM RBC

## 2022-06-28 MED ORDER — DIPHENHYDRAMINE HCL 25 MG PO CAPS
25.0000 mg | ORAL_CAPSULE | Freq: Once | ORAL | Status: AC
  Administered 2022-06-28: 25 mg via ORAL
  Filled 2022-06-28: qty 1

## 2022-06-28 MED ORDER — SODIUM CHLORIDE 0.9% IV SOLUTION
250.0000 mL | Freq: Once | INTRAVENOUS | Status: AC
  Administered 2022-06-28: 250 mL via INTRAVENOUS

## 2022-06-28 MED ORDER — ONDANSETRON HCL 8 MG PO TABS
8.0000 mg | ORAL_TABLET | Freq: Once | ORAL | Status: AC
  Administered 2022-06-28: 8 mg via ORAL
  Filled 2022-06-28: qty 1

## 2022-06-28 MED ORDER — ACETAMINOPHEN 325 MG PO TABS
650.0000 mg | ORAL_TABLET | Freq: Once | ORAL | Status: AC
  Administered 2022-06-28: 650 mg via ORAL
  Filled 2022-06-28: qty 2

## 2022-06-28 MED ORDER — AZACITIDINE CHEMO SQ INJECTION
60.0000 mg/m2 | Freq: Once | INTRAMUSCULAR | Status: AC
  Administered 2022-06-28: 100 mg via SUBCUTANEOUS
  Filled 2022-06-28: qty 4

## 2022-06-29 ENCOUNTER — Other Ambulatory Visit: Payer: Self-pay

## 2022-06-29 ENCOUNTER — Inpatient Hospital Stay: Payer: Medicare Other

## 2022-06-29 ENCOUNTER — Ambulatory Visit: Payer: Medicare Other

## 2022-06-29 VITALS — BP 121/49 | HR 77 | Temp 99.9°F | Resp 24

## 2022-06-29 DIAGNOSIS — C50212 Malignant neoplasm of upper-inner quadrant of left female breast: Secondary | ICD-10-CM

## 2022-06-29 DIAGNOSIS — Z5111 Encounter for antineoplastic chemotherapy: Secondary | ICD-10-CM | POA: Diagnosis not present

## 2022-06-29 LAB — BPAM RBC
Blood Product Expiration Date: 202407012359
ISSUE DATE / TIME: 202406041158
Unit Type and Rh: 5100

## 2022-06-29 LAB — TYPE AND SCREEN
Antibody Screen: NEGATIVE
Unit division: 0

## 2022-06-29 LAB — PREPARE PLATELET PHERESIS: Unit division: 0

## 2022-06-29 LAB — BPAM PLATELET PHERESIS
Blood Product Expiration Date: 202406052359
ISSUE DATE / TIME: 202406041057
Unit Type and Rh: 6200
Unit Type and Rh: 9500

## 2022-06-29 MED ORDER — ONDANSETRON HCL 8 MG PO TABS
8.0000 mg | ORAL_TABLET | Freq: Once | ORAL | Status: AC
  Administered 2022-06-29: 8 mg via ORAL
  Filled 2022-06-29: qty 1

## 2022-06-29 MED ORDER — AZACITIDINE CHEMO SQ INJECTION
60.0000 mg/m2 | Freq: Once | INTRAMUSCULAR | Status: AC
  Administered 2022-06-29: 100 mg via SUBCUTANEOUS
  Filled 2022-06-29: qty 4

## 2022-06-29 NOTE — Patient Instructions (Signed)
Instrucciones al darle de alta: Discharge Instructions Gracias por elegir al [facility] para brindarle atencin mdica de oncologa y hematologa.   Si usted tiene una cita de laboratorio con el [facility], por favor vaya directamente al [facility] y regstrese en el rea de registro.   Use ropa cmoda y adecuada para tener fcil acceso a las vas del Portacath (acceso venoso de larga duracin) o la lnea PICC (catter central colocado por va perifrica).   Nos esforzamos por ofrecerle tiempo de calidad con su proveedor. Es posible que tenga que volver a programar su cita si llega tarde (15 minutos o ms).  El llegar tarde le afecta a usted y a otros pacientes cuyas citas son posteriores a la suya.  Adems, si usted falta a tres o ms citas sin avisar a la oficina, puede ser retirado(a) de la clnica a discrecin del proveedor.      Para las solicitudes de renovacin de recetas, pida a su farmacia que se ponga en contacto con nuestra oficina y deje que transcurran 72 horas para que se complete el proceso de las renovaciones.    Hoy usted recibi los siguientes agentes de quimioterapia e/o inmunoterapia: Vidaza        Para ayudar a prevenir las nuseas y los vmitos despus de su tratamiento, le recomendamos que tome su medicamento para las nuseas segn las indicaciones.  LOS SNTOMAS QUE DEBEN COMUNICARSE INMEDIATAMENTE SE INDICAN A CONTINUACIN: *FIEBRE SUPERIOR A 100.4 F (38 C) O MS *ESCALOFROS O SUDORACIN *NUSEAS Y VMITOS QUE NO SE CONTROLAN CON EL MEDICAMENTO PARA LAS NUSEAS *DIFICULTAD INUSUAL PARA RESPIRAR  *MORETONES O HEMORRAGIAS NO HABITUALES *PROBLEMAS URINARIOS (dolor o ardor al orinar o frecuencia para orinar) *PROBLEMAS INTESTINALES (diarrea inusual, estreimiento, dolor cerca del ano) SENSIBILIDAD EN LA BOCA Y EN LA GARGANTA CON O SIN LA PRESENCIA DE LCERAS (dolor de garganta, llagas en la boca o dolor de muelas/dientes) ERUPCIN,  HINCHAZN O DOLORES INUSUALES FLUJO VAGINAL INUSUAL O PICAZN/RASQUIA    Los puntos marcados con un asterisco ( *) indican una posible emergencia y debe hacer un seguimiento tan pronto como le sea posible o vaya al Departamento de Emergencias si se le presenta algn problema.  Por favor, muestre la TARJETA DE ADVERTENCIA DE QUIMIOTERAPIA O LA TARJETA DE ADVERTENCIA DE INMUNOTERAPIA al registrarse en el Departamento de Emergencias y a la enfermera de triaje.  Si tiene preguntas despus de su visita o necesita cancelar o volver a programar su cita, por favor pngase en contacto con [facility]  Dept: [phone] y siga las instrucciones. Las horas de oficina son de 8:00 a.m. a 4:30 p.m. de lunes a viernes. Por favor, tenga en cuenta que los mensajes de voz que se dejan despus de las 4:00 p.m. posiblemente no se devolvern hasta el siguiente da de trabajo.  Cerramos los fines de semana y los das festivos importantes. En todo momento tiene acceso a una enfermera para preguntas urgentes. Por favor, llame al nmero principal de la clnica Dept: [phone] y siga las instrucciones.   Para cualquier pregunta que no sea de carcter urgente, tambin puede ponerse en contacto con su proveedor utilizando MyChart. Ahora ofrecemos visitas electrnicas para cualquier persona mayor de 18 aos que solicite atencin mdica en lnea para los sntomas que no sean urgentes. Para ms detalles vaya a [email or web address].   Tambin puede bajar la aplicacin de MyChart! Vaya a la tienda de aplicaciones, busque "MyChart",   abra la aplicacin, seleccione , e ingrese con su nombre de usuario y la contrasea de MyChart. 

## 2022-06-29 NOTE — Progress Notes (Unsigned)
S:     No chief complaint on file.  67 y.o. female who presents for diabetes evaluation, education, and management.  PMH is significant for T2DM, HTN, HLD, stroke, breast cancer, and GERD.  Patient was referred and last seen by Primary Care Provider, NP Bertram Denver, on 05/16/2022.   At that visit, A1c resulted at 10% (down from 10.7%). Semglee was increased from 20 units daily to 20 units BID. Humalog sliding scale insulin was added. Of note, BP was 123/58 mmHg at that visit.   Today, patient arrives in good spirits and presents without any assistance. Assistance provided by online interpreter: Wynona Canes [phone].  Family/Social History: Fhx: son - diabetes  Current diabetes medications include: humalog 10 units TID, semglee 20 units BID Current hypertension medications include: losartan 50 mg daily, amlodipine 5 mg daily Current hyperlipidemia medications include: atorvastatin 40 mg daily, fenofibrate 145 mg daily  Patient reports adherence with her diabetes medications. She is also taking atorvastatin. She brings two medications to appointment which are atorvastatin 40 mg and pantoprazole 40 mg. She reports missing her blood pressure medications for ~1week. We called [facility] pharmacy and confirmed she is due on refills for the amlodipine. Losartan was filled last month by our pharmacy, however, she does not have that with her today.   Do you feel that your medications are working for you? yes Have you been experiencing any side effects to the medications prescribed? no Do you have any problems obtaining medications due to transportation or finances? no Insurance coverage: medicare/medicaid   Patient denies hypoglycemic events.   She is not checking CBGs at home.   Patient denies nocturia (nighttime urination).  Patient denies neuropathy (nerve pain). Patient denies visual changes. Patient reports self foot exams.   O:  Lab Results  Component Value Date   HGBA1C 10.0 (A)  05/16/2022   There were no vitals filed for this visit.  Lipid Panel     Component Value Date/Time   CHOL 79 (L) 01/31/2022 1015   TRIG 113 01/31/2022 1015   HDL 36 (L) 01/31/2022 1015   CHOLHDL 2.2 01/31/2022 1015   CHOLHDL 4.6 05/19/2014 0625   VLDL 54 (H) 05/19/2014 0625   LDLCALC 22 01/31/2022 1015    Clinical Atherosclerotic Cardiovascular Disease (ASCVD): Yes  The ASCVD Risk score (Arnett DK, et al., 2019) failed to calculate for the following reasons:   The patient has a prior MI or stroke diagnosis   Patient is participating in a Managed Medicaid Plan: No  A/P: Diabetes longstanding currently uncontrolled. Patient is able to verbalize appropriate hypoglycemia management plan. Medication adherence appears optimal with her insulin. Control is suboptimal due to not checking her blood sugars. -Continued basal insulin 20 units BID of Semglee (insulin glargine) -Continued rapid insulin 20 units TID of Humalog (insulin lispro)  - In order to obtain libre sensor, patient needs to bring her red, white, and blue card to [facility]. Patient was called and instructed to do this. Received verbal confirmation that she will. -Patient educated on purpose, proper use, and potential adverse effects.  -Extensively discussed pathophysiology of diabetes, recommended lifestyle interventions, dietary effects on blood sugar control.  -Counseled on s/sx of and management of hypoglycemia.  -Next A1c anticipated 07/2022.   ASCVD risk - secondary prevention in patient with diabetes. Last LDL is 22 at goal of <70 mg/dL. ASCVD risk factors include diabetes high intensity statin indicated.  -Continued atorvastatin 40 mg.   Hypertension longstanding currently controlled. Blood pressure goal of <  130/80, currently at  123/58 mmHg. Medication adherence suboptimal - it is unclear what she is or is not taking. -Refilled amlodipine 5 mg.  -Unsure if patient has losartan 50 mg, will reassess on  07/08/2022.  Written patient instructions provided. Patient verbalized understanding of treatment plan.  Total time in face to face counseling 30 minutes.    Follow-up:  Pharmacist in 1 week.   Patient seen with  Alesia Banda, PharmD Candidate [employer] 20[employer]    Butch Penny, PharmD, [place], CPP Clinical Pharmacist [facility] [phone]

## 2022-06-30 ENCOUNTER — Ambulatory Visit: Payer: Medicare Other

## 2022-06-30 ENCOUNTER — Other Ambulatory Visit: Payer: Self-pay

## 2022-06-30 ENCOUNTER — Ambulatory Visit: Payer: Medicare Other | Attending: Nurse Practitioner | Admitting: Pharmacist

## 2022-06-30 ENCOUNTER — Inpatient Hospital Stay: Payer: Medicare Other

## 2022-06-30 ENCOUNTER — Encounter: Payer: Self-pay | Admitting: Pharmacist

## 2022-06-30 ENCOUNTER — Telehealth: Payer: Self-pay

## 2022-06-30 VITALS — BP 136/43 | HR 83 | Temp 99.4°F | Resp 19 | Wt 120.8 lb

## 2022-06-30 DIAGNOSIS — E785 Hyperlipidemia, unspecified: Secondary | ICD-10-CM | POA: Insufficient documentation

## 2022-06-30 DIAGNOSIS — I10 Essential (primary) hypertension: Secondary | ICD-10-CM | POA: Diagnosis not present

## 2022-06-30 DIAGNOSIS — Z794 Long term (current) use of insulin: Secondary | ICD-10-CM | POA: Insufficient documentation

## 2022-06-30 DIAGNOSIS — E1165 Type 2 diabetes mellitus with hyperglycemia: Secondary | ICD-10-CM | POA: Diagnosis not present

## 2022-06-30 DIAGNOSIS — Z79899 Other long term (current) drug therapy: Secondary | ICD-10-CM | POA: Diagnosis not present

## 2022-06-30 DIAGNOSIS — Z5111 Encounter for antineoplastic chemotherapy: Secondary | ICD-10-CM | POA: Diagnosis not present

## 2022-06-30 DIAGNOSIS — Z853 Personal history of malignant neoplasm of breast: Secondary | ICD-10-CM | POA: Insufficient documentation

## 2022-06-30 DIAGNOSIS — D649 Anemia, unspecified: Secondary | ICD-10-CM

## 2022-06-30 DIAGNOSIS — D46Z Other myelodysplastic syndromes: Secondary | ICD-10-CM

## 2022-06-30 DIAGNOSIS — C50212 Malignant neoplasm of upper-inner quadrant of left female breast: Secondary | ICD-10-CM

## 2022-06-30 LAB — CBC WITH DIFFERENTIAL (CANCER CENTER ONLY)
Abs Immature Granulocytes: 0.01 10*3/uL (ref 0.00–0.07)
Basophils Absolute: 0 10*3/uL (ref 0.0–0.1)
Basophils Relative: 1 %
Eosinophils Absolute: 0.1 10*3/uL (ref 0.0–0.5)
Eosinophils Relative: 8 %
HCT: 22.9 % — ABNORMAL LOW (ref 36.0–46.0)
Hemoglobin: 8.4 g/dL — ABNORMAL LOW (ref 12.0–15.0)
Immature Granulocytes: 1 %
Lymphocytes Relative: 73 %
Lymphs Abs: 0.7 10*3/uL (ref 0.7–4.0)
MCH: 30.8 pg (ref 26.0–34.0)
MCHC: 36.7 g/dL — ABNORMAL HIGH (ref 30.0–36.0)
MCV: 83.9 fL (ref 80.0–100.0)
Monocytes Absolute: 0.1 10*3/uL (ref 0.1–1.0)
Monocytes Relative: 8 %
Neutro Abs: 0.1 10*3/uL — CL (ref 1.7–7.7)
Neutrophils Relative %: 9 %
Platelet Count: 17 10*3/uL — ABNORMAL LOW (ref 150–400)
RBC: 2.73 MIL/uL — ABNORMAL LOW (ref 3.87–5.11)
RDW: 14.7 % (ref 11.5–15.5)
WBC Count: 1 10*3/uL — ABNORMAL LOW (ref 4.0–10.5)
nRBC: 1.9 % — ABNORMAL HIGH (ref 0.0–0.2)

## 2022-06-30 LAB — TYPE AND SCREEN
ABO/RH(D): O POS
Antibody Screen: NEGATIVE

## 2022-06-30 LAB — CMP (CANCER CENTER ONLY)
ALT: 20 U/L (ref 0–44)
AST: 22 U/L (ref 15–41)
Albumin: 3.9 g/dL (ref 3.5–5.0)
Alkaline Phosphatase: 156 U/L — ABNORMAL HIGH (ref 38–126)
Anion gap: 6 (ref 5–15)
BUN: 16 mg/dL (ref 8–23)
CO2: 26 mmol/L (ref 22–32)
Calcium: 9.1 mg/dL (ref 8.9–10.3)
Chloride: 97 mmol/L — ABNORMAL LOW (ref 98–111)
Creatinine: 0.84 mg/dL (ref 0.44–1.00)
GFR, Estimated: >60 mL/min (ref >60)
Glucose, Bld: 428 mg/dL — ABNORMAL HIGH (ref 70–99)
Potassium: 4.3 mmol/L (ref 3.5–5.1)
Sodium: 129 mmol/L — ABNORMAL LOW (ref 135–145)
Total Bilirubin: 0.8 mg/dL (ref 0.3–1.2)
Total Protein: 8.5 g/dL — ABNORMAL HIGH (ref 6.5–8.1)

## 2022-06-30 MED ORDER — ONDANSETRON HCL 8 MG PO TABS
8.0000 mg | ORAL_TABLET | Freq: Once | ORAL | Status: AC
  Administered 2022-06-30: 8 mg via ORAL
  Filled 2022-06-30: qty 1

## 2022-06-30 MED ORDER — AZACITIDINE CHEMO SQ INJECTION
60.0000 mg/m2 | Freq: Once | INTRAMUSCULAR | Status: AC
  Administered 2022-06-30: 100 mg via SUBCUTANEOUS
  Filled 2022-06-30: qty 4

## 2022-06-30 NOTE — Patient Instructions (Signed)
Instrucciones al darle de alta: Discharge Instructions Gracias por elegir al [facility] [place] para brindarle atencin mdica de oncologa y hematologa.   Si usted tiene una cita de laboratorio con el [facility], por favor vaya directamente al [facility] y regstrese en el rea de registro.   Use ropa cmoda y adecuada para tener fcil acceso a las vas del Portacath (acceso venoso de larga duracin) o la lnea PICC (catter central colocado por va perifrica).   Nos esforzamos por ofrecerle tiempo de calidad con su proveedor. Es posible que tenga que volver a programar su cita si llega tarde (15 minutos o ms).  El llegar tarde le afecta a usted y a otros pacientes cuyas citas son posteriores a la suya.  Adems, si usted falta a tres o ms citas sin avisar a la oficina, puede ser retirado(a) de la clnica a discrecin del proveedor.      Para las solicitudes de renovacin de recetas, pida a su farmacia que se ponga en contacto con nuestra oficina y deje que transcurran 72 horas para que se complete el proceso de las renovaciones.    Hoy usted recibi los siguientes agentes de quimioterapia e/o inmunoterapia: Azacitidine (Vidaza)      Para ayudar a prevenir las nuseas y los vmitos despus de su tratamiento, le recomendamos que tome su medicamento para las nuseas segn las indicaciones.  LOS SNTOMAS QUE DEBEN COMUNICARSE INMEDIATAMENTE SE INDICAN A CONTINUACIN: *FIEBRE SUPERIOR A 100.4 F (38 C) O MS *ESCALOFROS O SUDORACIN *NUSEAS Y VMITOS QUE NO SE CONTROLAN CON EL MEDICAMENTO PARA LAS NUSEAS *DIFICULTAD INUSUAL PARA RESPIRAR  *MORETONES O HEMORRAGIAS NO HABITUALES *PROBLEMAS URINARIOS (dolor o ardor al orinar o frecuencia para orinar) *PROBLEMAS INTESTINALES (diarrea inusual, estreimiento, dolor cerca del ano) SENSIBILIDAD EN LA BOCA Y EN LA GARGANTA CON O SIN LA PRESENCIA DE LCERAS (dolor de garganta, llagas en la boca o dolor de  muelas/dientes) ERUPCIN, HINCHAZN O DOLORES INUSUALES FLUJO VAGINAL INUSUAL O PICAZN/RASQUIA    Los puntos marcados con un asterisco ( *) indican una posible emergencia y debe hacer un seguimiento tan pronto como le sea posible o vaya al Departamento de Emergencias si se le presenta algn problema.  Por favor, muestre la TARJETA DE ADVERTENCIA DE QUIMIOTERAPIA O LA TARJETA DE ADVERTENCIA DE INMUNOTERAPIA al registrarse en el Departamento de Emergencias y a la enfermera de triaje.  Si tiene preguntas despus de su visita o necesita cancelar o volver a programar su cita, por favor pngase en contacto con [facility]  Dept: [phone] y siga las instrucciones. Las horas de oficina son de 8:00 a.m. a 4:30 p.m. de lunes a viernes. Por favor, tenga en cuenta que los mensajes de voz que se dejan despus de las 4:00 p.m. posiblemente no se devolvern hasta el siguiente da de trabajo.  Cerramos los fines de semana y los das festivos importantes. En todo momento tiene acceso a una enfermera para preguntas urgentes. Por favor, llame al nmero principal de la clnica Dept: [phone] y siga las instrucciones.   Para cualquier pregunta que no sea de carcter urgente, tambin puede ponerse en contacto con su proveedor utilizando MyChart. Ahora ofrecemos visitas electrnicas para cualquier persona mayor de 18 aos que solicite atencin mdica en lnea para los sntomas que no sean urgentes. Para ms detalles vaya a [email or web address].   Tambin puede bajar la aplicacin de MyChart! Vaya a la tienda de aplicaciones, busque "MyChart", abra   la aplicacin, seleccione [place], e ingrese con su nombre de usuario y la contrasea de MyChart.  

## 2022-06-30 NOTE — Telephone Encounter (Signed)
CRITICAL VALUE STICKER  CRITICAL VALUE: ANC 0.1  RECEIVER (on-site recipient of call): Sharlette Dense CMA  DATE & TIME NOTIFIED: 06/30/2022 1425  MESSENGER (representative from lab): Monica in Lab  MD NOTIFIED: Dr. Mosetta Putt  TIME OF NOTIFICATION: 1425  RESPONSE:  Made doctor aware

## 2022-07-01 ENCOUNTER — Other Ambulatory Visit: Payer: Self-pay

## 2022-07-01 ENCOUNTER — Ambulatory Visit: Payer: Medicare Other

## 2022-07-01 ENCOUNTER — Inpatient Hospital Stay: Payer: Medicare Other

## 2022-07-01 VITALS — BP 113/47 | HR 75 | Temp 99.1°F | Resp 17 | Wt 118.0 lb

## 2022-07-01 DIAGNOSIS — Z5111 Encounter for antineoplastic chemotherapy: Secondary | ICD-10-CM | POA: Diagnosis not present

## 2022-07-01 DIAGNOSIS — Z170 Estrogen receptor positive status [ER+]: Secondary | ICD-10-CM

## 2022-07-01 LAB — BPAM PLATELET PHERESIS: Unit Type and Rh: 6200

## 2022-07-01 MED ORDER — ACETAMINOPHEN 325 MG PO TABS
650.0000 mg | ORAL_TABLET | Freq: Once | ORAL | Status: AC
  Administered 2022-07-01: 650 mg via ORAL
  Filled 2022-07-01: qty 2

## 2022-07-01 MED ORDER — SODIUM CHLORIDE 0.9% IV SOLUTION
250.0000 mL | Freq: Once | INTRAVENOUS | Status: AC
  Administered 2022-07-01: 250 mL via INTRAVENOUS

## 2022-07-01 MED ORDER — AZACITIDINE CHEMO SQ INJECTION
60.0000 mg/m2 | Freq: Once | INTRAMUSCULAR | Status: AC
  Administered 2022-07-01: 100 mg via SUBCUTANEOUS
  Filled 2022-07-01: qty 4

## 2022-07-01 MED ORDER — DIPHENHYDRAMINE HCL 25 MG PO CAPS
25.0000 mg | ORAL_CAPSULE | Freq: Once | ORAL | Status: AC
  Administered 2022-07-01: 25 mg via ORAL
  Filled 2022-07-01: qty 1

## 2022-07-01 MED ORDER — ONDANSETRON HCL 8 MG PO TABS
8.0000 mg | ORAL_TABLET | Freq: Once | ORAL | Status: AC
  Administered 2022-07-01: 8 mg via ORAL
  Filled 2022-07-01: qty 1

## 2022-07-01 NOTE — Progress Notes (Signed)
Verbal order given to administer 1 unit of Platelets from Dr.Feng, spoke with [place] in blood bank type and screen order received, patient will have 1 unit of Platelets transfused on 07/01/2022

## 2022-07-01 NOTE — Patient Instructions (Signed)
Instrucciones al darle de alta: Discharge Instructions Gracias por elegir al [facility] para brindarle atencin mdica de oncologa y hematologa.   Si usted tiene una cita de laboratorio con el [facility], por favor vaya directamente al [facility] y regstrese en el rea de registro.   Use ropa cmoda y adecuada para tener fcil acceso a las vas del Portacath (acceso venoso de larga duracin) o la lnea PICC (catter central colocado por va perifrica).   Nos esforzamos por ofrecerle tiempo de calidad con su proveedor. Es posible que tenga que volver a programar su cita si llega tarde (15 minutos o ms).  El llegar tarde le afecta a usted y a otros pacientes cuyas citas son posteriores a la suya.  Adems, si usted falta a tres o ms citas sin avisar a la oficina, puede ser retirado(a) de la clnica a discrecin del proveedor.      Para las solicitudes de renovacin de recetas, pida a su farmacia que se ponga en contacto con nuestra oficina y deje que transcurran 72 horas para que se complete el proceso de las renovaciones.    Hoy usted recibi los siguientes agentes de quimioterapia e/o inmunoterapia: Vidaza        Para ayudar a prevenir las nuseas y los vmitos despus de su tratamiento, le recomendamos que tome su medicamento para las nuseas segn las indicaciones.  LOS SNTOMAS QUE DEBEN COMUNICARSE INMEDIATAMENTE SE INDICAN A CONTINUACIN: *FIEBRE SUPERIOR A 100.4 F (38 C) O MS *ESCALOFROS O SUDORACIN *NUSEAS Y VMITOS QUE NO SE CONTROLAN CON EL MEDICAMENTO PARA LAS NUSEAS *DIFICULTAD INUSUAL PARA RESPIRAR  *MORETONES O HEMORRAGIAS NO HABITUALES *PROBLEMAS URINARIOS (dolor o ardor al orinar o frecuencia para orinar) *PROBLEMAS INTESTINALES (diarrea inusual, estreimiento, dolor cerca del ano) SENSIBILIDAD EN LA BOCA Y EN LA GARGANTA CON O SIN LA PRESENCIA DE LCERAS (dolor de garganta, llagas en la boca o dolor de muelas/dientes) ERUPCIN,  HINCHAZN O DOLORES INUSUALES FLUJO VAGINAL INUSUAL O PICAZN/RASQUIA    Los puntos marcados con un asterisco ( *) indican una posible emergencia y debe hacer un seguimiento tan pronto como le sea posible o vaya al Departamento de Emergencias si se le presenta algn problema.  Por favor, muestre la TARJETA DE ADVERTENCIA DE QUIMIOTERAPIA O LA TARJETA DE ADVERTENCIA DE INMUNOTERAPIA al registrarse en el Departamento de Emergencias y a la enfermera de triaje.  Si tiene preguntas despus de su visita o necesita cancelar o volver a programar su cita, por favor pngase en contacto con [facility]  Dept: [phone] y siga las instrucciones. Las horas de oficina son de 8:00 a.m. a 4:30 p.m. de lunes a viernes. Por favor, tenga en cuenta que los mensajes de voz que se dejan despus de las 4:00 p.m. posiblemente no se devolvern hasta el siguiente da de trabajo.  Cerramos los fines de semana y los das festivos importantes. En todo momento tiene acceso a una enfermera para preguntas urgentes. Por favor, llame al nmero principal de la clnica Dept: [phone] y siga las instrucciones.   Para cualquier pregunta que no sea de carcter urgente, tambin puede ponerse en contacto con su proveedor utilizando MyChart. Ahora ofrecemos visitas electrnicas para cualquier persona mayor de 18 aos que solicite atencin mdica en lnea para los sntomas que no sean urgentes. Para ms detalles vaya a [email or web address].   Tambin puede bajar la aplicacin de MyChart! Vaya a la tienda de aplicaciones, busque "MyChart",   aplicacin, seleccione [place], e ingrese con su nombre de usuario y la contrasea de Clinical cytogeneticist.  Transfusin de plaquetas Platelet Transfusion La transfusin de plaquetas es un procedimiento en el cual una persona recibe plaquetas de un donante a travs de una va intravenosa. Las plaquetas son fragmentos de la sangre que se unen y forman un cogulo  para ayudar a que el cuerpo deje de Geophysicist/field seismologist despus de una lesin. Si tiene [employer], es posible que su sangre tenga problemas para Biochemist, clinical. Esto puede causar sangrado y moretones con mucha facilidad. Tal vez deba recibir una transfusin de plaquetas si tiene una enfermedad que reduce el nmero de plaquetas (trombocitopenia). Se puede usar una transfusin de plaquetas para detener o evitar el sangrado excesivo. Informe al mdico acerca de lo siguiente: Las reacciones que haya tenido durante transfusiones previas. Cualquier alergia que tenga. Todos los [employer] [place], incluidos vitaminas, hierbas, gotas oftlmicas, cremas y [address] de [address]. Cualquier problema de la sangre que tenga. Cirugas a las que se haya sometido. Cualquier afeccin mdica que tenga. Si est embarazada o podra estarlo. Cules son los riesgos? En general, se trata de un procedimiento seguro. No obstante, pueden ocurrir complicaciones, por ejemplo: Grant Ruts. Infeccin. Reaccin alrgica a las plaquetas donadas (de un donante). Que el sistema de su cuerpo que combate las enfermedades (sistema inmunitario) ataque las plaquetas del donante (reaccin hemoltica). Esto es poco frecuente. Una reaccin poco frecuente que causa dao pulmonar (lesin pulmonar aguda producida por transfusin). Qu ocurre antes del procedimiento? Medicamentos Consulte al mdico si debe hacer o no lo siguiente: Multimedia programmer o suspender los medicamentos que [place] habitualmente. Esto es muy importante si toma medicamentos para la diabetes o anticoagulantes. Tomar medicamentos como aspirina e ibuprofeno. Estos medicamentos pueden tener un efecto anticoagulante en la [place]. No tome estos medicamentos a menos que el mdico se lo indique. Usar medicamentos de venta libre, vitaminas, hierbas y suplementos. Indicaciones generales Se le realizar un anlisis de sangre para determinar su grupo sanguneo. Su tipo de sangre determina qu  tipo de plaquetas recibir. Siga las instrucciones del mdico respecto de las restricciones en las comidas o las bebidas. Si tuvo una reaccin alrgica a una transfusin en el pasado, tal vez le administren un medicamento que ayude a evitarla. Le controlarn la temperatura, la presin arterial, el pulso y la respiracin. Qu ocurre durante el procedimiento?  Le colocarn una va intravenosa (i.v.) en una vena. Por su seguridad, dos mdicos verificarn su identidad y las plaquetas del donante que se le van a infundir. Una bolsa con las plaquetas del donante se conectar a su va intravenosa. Las plaquetas ingresarn en su torrente sanguneo. Esto generalmente demora entre 30 y 60 minutos. Durante la transfusin, le controlarn la temperatura, la presin arterial, el pulso y la respiracin. Esto ayuda a detectar signos tempranos de una reaccin. Tambin lo controlarn para Engineer, manufacturing otros sntomas que puedan indicar una reaccin, como escalofros, urticaria o picazn. Si tiene signos de Astronomer, se suspender la transfusin y tal vez le administren un medicamento para Animator. Una vez finalizada la transfusin, se retirar la va intravenosa. Se puede aplicar presin en el lugar de la va intravenosa (i.v.) para detener cualquier sangrado. El lugar de la va intravenosa (i.v.) se cubrir con un vendaje (venda). El procedimiento puede variar segn el mdico y el hospital. Qu puedo esperar despus del procedimiento? Le controlarn la presin arterial, la temperatura, el pulso y la respiracin hasta que [place]  [facility] o la clnica. Es posible que tenga algunos moretones y Stage manager en el lugar de la va intravenosa (i.v.). Siga estas indicaciones en su casa: Medicamentos Use los medicamentos de venta libre y los recetados solamente como se lo haya indicado el mdico. Hable con el mdico antes de tomar cualquier medicamento que contenga aspirina o  antiinflamatorios no esteroideos (AINE), como el ibuprofeno. Estos medicamentos aumentan el riesgo de tener una hemorragia peligrosa. Cuidado del lugar de la va intravenosa (i.v.) Controle el lugar de la va intravenosa (i.v.) [employer] para descartar signos de infeccin. Est atento a los siguientes signos: Enrojecimiento, Optician, dispensing. Lquido o sangre. Si sale lquido o sangre del Environmental consultant de la va intravenosa (i.v.), ejerza presin firme con las manos sobre el vendaje que cubre la zona durante uno o dos minutos. Al hacerlo, debera detenerse el sangrado. Calor. Pus o mal olor. Indicaciones generales [place] o retire Secondary school teacher se lo haya indicado el mdico. Retome sus actividades normales como se lo haya indicado el mdico. Pregntele al mdico qu actividades son seguras para usted. No tome baos de inmersin, no nade ni use el jacuzzi hasta que el mdico lo autorice. Pregntele al mdico si puede ducharse. Concurra a todas las visitas de seguimiento. Esto es importante. Comunquese con un mdico si: Siente un dolor de cabeza que no se alivia con medicamentos. Tiene ronchas, una erupcin cutnea o picazn en la piel. Tiene nuseas o vmitos. Se siente cansado o dbil de un modo que no es habitual. Tiene signos de infeccin en el lugar de la va intravenosa (i.v.). Solicite ayuda de inmediato si: Tiene fiebre o escalofros. Orina con menos frecuencia que lo habitual. La orina es de un color ms oscuro que lo normal. Tiene alguno de los siguientes sntomas: Dificultad para Industrial/product designer. Dolor en la espalda, el abdomen o el pecho. Piel fra y hmeda. Latidos cardacos acelerados. Resumen Las plaquetas son fragmentos diminutos de clulas sanguneas que se agrupan para formar un cogulo de sangre cuando se produce una herida. Si tiene [employer], es posible que su sangre tenga problemas para Biochemist, clinical. La transfusin de plaquetas es un procedimiento en el cual se reciben  plaquetas de un donante a travs de una va intravenosa. Se puede usar una transfusin de plaquetas para detener o evitar el sangrado excesivo. Despus del procedimiento, controle el lugar de la va intravenosa (i.v.) todos los das para descartar signos de infeccin. Esta informacin no tiene Theme park manager el consejo del mdico. Asegrese de hacerle al mdico cualquier pregunta que tenga. Document Revised: 08/12/2020 Document Reviewed: 08/12/2020 Elsevier Patient Education  2024 [employer].

## 2022-07-02 LAB — PREPARE PLATELET PHERESIS: Unit division: 0

## 2022-07-02 LAB — BPAM PLATELET PHERESIS
Blood Product Expiration Date: 202406102359
ISSUE DATE / TIME: 202406071034

## 2022-07-04 ENCOUNTER — Other Ambulatory Visit: Payer: Self-pay | Admitting: *Deleted

## 2022-07-04 ENCOUNTER — Inpatient Hospital Stay: Payer: Medicare Other

## 2022-07-04 ENCOUNTER — Ambulatory Visit: Payer: Self-pay | Admitting: *Deleted

## 2022-07-04 ENCOUNTER — Other Ambulatory Visit: Payer: Self-pay

## 2022-07-04 ENCOUNTER — Telehealth: Payer: Self-pay | Admitting: Hematology

## 2022-07-04 DIAGNOSIS — D649 Anemia, unspecified: Secondary | ICD-10-CM

## 2022-07-04 DIAGNOSIS — C50212 Malignant neoplasm of upper-inner quadrant of left female breast: Secondary | ICD-10-CM

## 2022-07-04 DIAGNOSIS — Z5111 Encounter for antineoplastic chemotherapy: Secondary | ICD-10-CM | POA: Diagnosis not present

## 2022-07-04 DIAGNOSIS — D46Z Other myelodysplastic syndromes: Secondary | ICD-10-CM

## 2022-07-04 LAB — CBC WITH DIFFERENTIAL (CANCER CENTER ONLY)
Abs Immature Granulocytes: 0 10*3/uL (ref 0.00–0.07)
Basophils Absolute: 0 10*3/uL (ref 0.0–0.1)
Basophils Relative: 1 %
Blasts: 3 %
Eosinophils Absolute: 0.1 10*3/uL (ref 0.0–0.5)
Eosinophils Relative: 7 %
HCT: 21.1 % — ABNORMAL LOW (ref 36.0–46.0)
Hemoglobin: 7.4 g/dL — ABNORMAL LOW (ref 12.0–15.0)
Lymphocytes Relative: 81 %
Lymphs Abs: 0.8 10*3/uL (ref 0.7–4.0)
MCH: 30 pg (ref 26.0–34.0)
MCHC: 35.1 g/dL (ref 30.0–36.0)
MCV: 85.4 fL (ref 80.0–100.0)
Monocytes Absolute: 0 10*3/uL — ABNORMAL LOW (ref 0.1–1.0)
Monocytes Relative: 2 %
Neutro Abs: 0.1 10*3/uL — CL (ref 1.7–7.7)
Neutrophils Relative %: 6 %
Platelet Count: 11 10*3/uL — ABNORMAL LOW (ref 150–400)
RBC: 2.47 MIL/uL — ABNORMAL LOW (ref 3.87–5.11)
RDW: 14.6 % (ref 11.5–15.5)
Smear Review: DECREASED
WBC Count: 1 10*3/uL — ABNORMAL LOW (ref 4.0–10.5)
nRBC: 3 % — ABNORMAL HIGH (ref 0.0–0.2)

## 2022-07-04 LAB — SAMPLE TO BLOOD BANK

## 2022-07-04 LAB — TYPE AND SCREEN

## 2022-07-04 LAB — PREPARE PLATELET PHERESIS: Unit division: 0

## 2022-07-04 LAB — BPAM PLATELET PHERESIS: Blood Product Expiration Date: 202406112359

## 2022-07-04 LAB — PREPARE RBC (CROSSMATCH)

## 2022-07-04 NOTE — Patient Outreach (Signed)
  Care Coordination   Follow Up Visit Note   07/04/2022 Name: Amanda Duarte MRN: [id number] DOB: 02-Mar-1955  Amanda Duarte is a 67 y.o. year old female who sees Claiborne Rigg, NP for primary care. I spoke with  Olin Hauser by phone today.  What matters to the patients health and wellness today?  Obtain resources for son and start monitoring blood sugar     Goals Addressed               This Visit's Progress     Patient will report she is getting adequate food to support herself and her grandson in the next month.   On track        Interventions Today    Flowsheet Row Most Recent Value  Chronic Disease   Chronic disease during today's visit Diabetes  General Interventions   General Interventions Discussed/Reviewed General Interventions Reviewed, Labs, Doctor Visits, Durable Medical Equipment (DME)  Labs Hgb A1c every 3 months  [A1C is 10, goal of less than 7]  Doctor Visits Discussed/Reviewed Doctor Visits Reviewed, PCP, Specialist  [cancer center on 6/13, pharmacist 6/14, oncology 6/20, PCP 7/30]  Durable Medical Equipment (DME) Glucomoter  [Does not monitor blood sugars, state she does not like sticking her finger.  Will collaborate with pharmacist to order Libre]  PCP/Specialist Visits Compliance with follow-up visit  Education Interventions   Education Provided Provided Education  Provided Verbal Education On Blood Sugar Monitoring, Medication, Community Resources  [Will provide resources again for disability information for grandson]  Nutrition Interventions   Nutrition Discussed/Reviewed Nutrition Reviewed, Adding fruits and vegetables, Decreasing sugar intake           To keep blood sugars stable while taking Prednisone (pt-stated)   On track     04/26/22 Pt is not taking prednisone, however, her chemotherapy DOES cause hyperglycemia. Pt is on an insulin regimen which she does follow.  Care Coordination Interventions: Provided education to patient about basic DM  disease process Reviewed medications with patient and discussed importance of medication adherence Provided patient with written educational materials related to hypo and hyperglycemia and importance of correct treatment Advised patient, providing education and rationale, to check cbg before meals and at bedtime and record, calling PCP for findings outside established parameters Review of patient status, including review of consultants reports, relevant laboratory and other test results, and medications completed            SDOH assessments and interventions completed:  No     Care Coordination Interventions:  Yes, provided   Follow up plan: Follow up call scheduled for 7/9    Encounter Outcome:  Pt. Visit Completed   Kemper Durie, RN, MSN, [facility] Care Management Care Management Coordinator [phone]

## 2022-07-04 NOTE — Telephone Encounter (Signed)
Contacted patient to scheduled appointments. Left message with appointment details and a call back number if patient had any questions or could not accommodate the time we provided.   

## 2022-07-04 NOTE — Patient Instructions (Signed)
Visit Information  Thank you for taking time to visit with me today. Please don't hesitate to contact me if I can be of assistance to you before our next scheduled telephone appointment.  Following are the goals we discussed today:   Activities and task to complete in order to accomplish goals.   Follow up on food resources discussed  [employer], [address] (in the building behind the main building) In Person Shopping: Wednesday 12pm-7pm, Friday 11am-2pm, and Saturday 9am-12pm  Disability  SOCIAL SECURITY ADMINSTRATION [address]  [place], [place] [address] [phone] / [phone]   [email or web address]     Our next appointment is by telephone on 7/9  Please call the care guide team at [phone] if you need to cancel or reschedule your appointment.   Please call the Suicide and Crisis Lifeline: 988 call the [place] National Suicide Prevention Lifeline: 626-076-0403 or TTY: 551-125-5846 TTY 204-276-3452) to talk to a trained counselor call 1-800-273-TALK (toll free, 24 hour hotline) call 911 if you are experiencing a Mental Health or Behavioral Health Crisis or need someone to talk to.  The patient verbalized understanding of instructions, educational materials, and care plan provided today and agreed to receive a mailed copy of patient instructions, educational materials, and care plan.   The patient has been provided with contact information for the care management team and has been advised to call with any health related questions or concerns.   Kemper Durie, RN, MSN, [facility] Care Management Care Management Coordinator [phone]

## 2022-07-05 ENCOUNTER — Other Ambulatory Visit: Payer: Medicare Other

## 2022-07-05 ENCOUNTER — Inpatient Hospital Stay: Payer: Medicare Other

## 2022-07-05 DIAGNOSIS — Z5111 Encounter for antineoplastic chemotherapy: Secondary | ICD-10-CM | POA: Diagnosis not present

## 2022-07-05 DIAGNOSIS — D46Z Other myelodysplastic syndromes: Secondary | ICD-10-CM

## 2022-07-05 LAB — PREPARE PLATELET PHERESIS

## 2022-07-05 LAB — BPAM RBC: Unit Type and Rh: 5100

## 2022-07-05 MED ORDER — SODIUM CHLORIDE 0.9% IV SOLUTION
250.0000 mL | Freq: Once | INTRAVENOUS | Status: AC
  Administered 2022-07-05: 250 mL via INTRAVENOUS

## 2022-07-05 MED ORDER — DIPHENHYDRAMINE HCL 25 MG PO CAPS
25.0000 mg | ORAL_CAPSULE | Freq: Once | ORAL | Status: AC
  Administered 2022-07-05: 25 mg via ORAL
  Filled 2022-07-05: qty 1

## 2022-07-05 MED ORDER — ACETAMINOPHEN 325 MG PO TABS
650.0000 mg | ORAL_TABLET | Freq: Once | ORAL | Status: AC
  Administered 2022-07-05: 650 mg via ORAL
  Filled 2022-07-05: qty 2

## 2022-07-05 NOTE — Patient Instructions (Signed)
Transfusin de [employer], cuidados posteriores Blood Transfusion, Adult, Care After Despus de una transfusin de [place], es comn presentar lo siguiente: Moretones y Art therapist de la va intravenosa (IV). Dolor de [place]. Siga estas instrucciones en su casa: El mdico podr darle ms instrucciones. Si tiene problemas, llame al [employer]. Cuidados del lugar de la insercin     Siga las instrucciones del mdico en lo que respecta al cuidado del Environmental consultant de insercin. Este es el lugar donde se coloc un tubo (catter) intravenoso en la vena. Asegrese de hacer lo siguiente: Lvese las manos con agua y jabn durante al menos 20 segundos antes y despus de cambiarse la venda. Use un desinfectante para manos si no dispone de [place] y [place]. Cambie las vendas como se lo haya indicado el mdico. Haematologist de insercin todos los [address]  [address] para detectar signos de infeccin. Est atento a los siguientes signos: Dolor, hinchazn o enrojecimiento. Sangrado proveniente del Environmental consultant. Calor. Pus o mal olor. Instrucciones generales Use los medicamentos de venta libre y los recetados solamente como se lo haya indicado el mdico. Haga reposo como se lo haya indicado el mdico. Retome sus actividades habituales como se lo haya indicado el mdico. Concurra a todas las visitas de seguimiento. Es posible que deba hacerse anlisis en ciertos momentos para Advice worker. Comunquese con un mdico si: Tiene picazn o zonas enrojecidas e hinchadas en la piel (urticaria). Tiene fiebre o escalofros. Tiene dolor de cabeza, en la espalda o el pecho. Est preocupado o nervioso (ansioso). Se siente dbil despus de realizar sus actividades habituales. Tiene alguno de los siguientes problemas en el lugar de la insercin: Enrojecimiento, hinchazn, calor o dolor. Sangrado que no se detiene al [employer] presin. Pus o mal olor. Si recibi la transfusin de sangre en un entorno para pacientes  ambulatorios, le indicarn con quin debe ponerse en contacto para informar cualquier reaccin. Solicite ayuda de inmediato si: Tiene signos de una reaccin grave. Puede deberse a Systems analyst o provenir del sistema de defensa del cuerpo (sistema inmunitario). Algunos signos son los siguientes: Dificultad para respirar o falta de aire. Hinchazn en la cara o sensacin de calor (sofoco). Una erupcin cutnea generalizada. Pis (orina) de color oscuro o sangre en el pis. Latidos cardacos acelerados. Estos sntomas pueden Customer service manager. Solicite ayuda de inmediato. Llame al 911. No espere a ver si los sntomas desaparecen. No conduzca por sus propios medios [employer]. Resumen Es normal tener moretones y Art therapist donde se coloc la va intravenosa (IV). Haematologist de insercin todos los [address]  [address] para detectar signos de infeccin. Haga reposo como se lo haya indicado el mdico. Retome sus actividades habituales como se lo haya indicado el mdico. Obtenga ayuda de inmediato si tiene signos de [place] reaccin grave. Esta informacin no tiene Theme park manager el consejo del mdico. Asegrese de hacerle al mdico cualquier pregunta que tenga. Document Revised: 05/06/2021 Document Reviewed: 05/06/2021 Elsevier Patient Education  2024 [employer].

## 2022-07-06 LAB — BPAM RBC
Blood Product Expiration Date: 202407082359
ISSUE DATE / TIME: 202406111348

## 2022-07-06 LAB — BPAM PLATELET PHERESIS
Blood Product Expiration Date: 202406112359
ISSUE DATE / TIME: 202406111348

## 2022-07-06 LAB — TYPE AND SCREEN
ABO/RH(D): O POS
Antibody Screen: NEGATIVE
Unit division: 0

## 2022-07-06 LAB — PREPARE PLATELET PHERESIS

## 2022-07-07 ENCOUNTER — Telehealth: Payer: Self-pay

## 2022-07-07 ENCOUNTER — Other Ambulatory Visit: Payer: Self-pay

## 2022-07-07 ENCOUNTER — Inpatient Hospital Stay: Payer: Medicare Other

## 2022-07-07 DIAGNOSIS — D649 Anemia, unspecified: Secondary | ICD-10-CM

## 2022-07-07 DIAGNOSIS — Z170 Estrogen receptor positive status [ER+]: Secondary | ICD-10-CM

## 2022-07-07 DIAGNOSIS — L03213 Periorbital cellulitis: Secondary | ICD-10-CM | POA: Diagnosis not present

## 2022-07-07 DIAGNOSIS — D46Z Other myelodysplastic syndromes: Secondary | ICD-10-CM

## 2022-07-07 DIAGNOSIS — R519 Headache, unspecified: Secondary | ICD-10-CM | POA: Diagnosis not present

## 2022-07-07 LAB — BPAM PLATELET PHERESIS: Unit Type and Rh: 600

## 2022-07-07 LAB — CBC WITH DIFFERENTIAL (CANCER CENTER ONLY)
Abs Immature Granulocytes: 0 10*3/uL (ref 0.00–0.07)
Basophils Absolute: 0 10*3/uL (ref 0.0–0.1)
Basophils Relative: 0 %
Blasts: 3 %
Eosinophils Absolute: 0 10*3/uL (ref 0.0–0.5)
Eosinophils Relative: 3 %
HCT: 25.3 % — ABNORMAL LOW (ref 36.0–46.0)
Hemoglobin: 8.8 g/dL — ABNORMAL LOW (ref 12.0–15.0)
Lymphocytes Relative: 83 %
Lymphs Abs: 0.8 10*3/uL (ref 0.7–4.0)
MCH: 29.7 pg (ref 26.0–34.0)
MCHC: 34.8 g/dL (ref 30.0–36.0)
MCV: 85.5 fL (ref 80.0–100.0)
Monocytes Absolute: 0 10*3/uL — ABNORMAL LOW (ref 0.1–1.0)
Monocytes Relative: 4 %
Neutro Abs: 0.1 10*3/uL — CL (ref 1.7–7.7)
Neutrophils Relative %: 7 %
Platelet Count: 14 10*3/uL — ABNORMAL LOW (ref 150–400)
RBC: 2.96 MIL/uL — ABNORMAL LOW (ref 3.87–5.11)
RDW: 14.1 % (ref 11.5–15.5)
Smear Review: DECREASED
WBC Count: 1 10*3/uL — ABNORMAL LOW (ref 4.0–10.5)
nRBC: 11 /100 WBC — ABNORMAL HIGH
nRBC: 8.3 % — ABNORMAL HIGH (ref 0.0–0.2)

## 2022-07-07 LAB — TYPE AND SCREEN
ABO/RH(D): O POS
Antibody Screen: NEGATIVE

## 2022-07-07 LAB — PREPARE PLATELET PHERESIS: Unit division: 0

## 2022-07-07 LAB — SAMPLE TO BLOOD BANK

## 2022-07-07 MED ORDER — ACETAMINOPHEN 325 MG PO TABS
650.0000 mg | ORAL_TABLET | Freq: Once | ORAL | Status: AC
  Administered 2022-07-07: 650 mg via ORAL

## 2022-07-07 MED ORDER — DIPHENHYDRAMINE HCL 25 MG PO CAPS
25.0000 mg | ORAL_CAPSULE | Freq: Once | ORAL | Status: AC
  Administered 2022-07-07: 25 mg via ORAL

## 2022-07-07 MED ORDER — SODIUM CHLORIDE 0.9% IV SOLUTION
250.0000 mL | Freq: Once | INTRAVENOUS | Status: AC
  Administered 2022-07-07: 250 mL via INTRAVENOUS

## 2022-07-07 NOTE — Telephone Encounter (Signed)
Critical lab value reported: ANC 0.1; Plt 14; Hbg 8.8  Dr. Mosetta Putt notified.

## 2022-07-07 NOTE — Progress Notes (Deleted)
S:     No chief complaint on file.  67 y.o. female who presents for diabetes evaluation, education, and management.  PMH is significant for T2DM, HTN, HLD, stroke, breast cancer, and GERD.   Patient was referred and last seen by Primary Care Provider, NP Bertram Denver, on 05/16/2022. Last seen by pharmacy clinic on 06/30/22. At last visit, patient stated she was out out of her blood pressure medications but had recently filled them for 90 DS. Asked patient to come back a week later with her all of her medications to assess further therapeutic decisions.  Today, patient arrives in *** good spirits and presents without *** any assistance. ***  Patient reports Diabetes was diagnosed in ***.   Family/Social History: ***  Current diabetes medications include: *** Current hypertension medications include: *** Current hyperlipidemia medications include: ***  Patient reports adherence to taking all medications as prescribed.  *** Patient denies adherence with medications, reports missing *** medications *** times per week, on average.  Do you feel that your medications are working for you? {YES NO:22349} Have you been experiencing any side effects to the medications prescribed? {YES NO:22349} Do you have any problems obtaining medications due to transportation or finances? {YES [id number] Insurance coverage: ***  Patient {Actions; denies-reports:120008} hypoglycemic events.  Reported home fasting blood sugars: ***  Reported 2 hour post-meal/random blood sugars: ***.  Patient {Actions; denies-reports:120008} nocturia (nighttime urination).  Patient {Actions; denies-reports:120008} neuropathy (nerve pain). Patient {Actions; denies-reports:120008} visual changes. Patient {Actions; denies-reports:120008} self foot exams.   Patient reported dietary habits: Eats *** meals/day Breakfast: *** Lunch: *** Dinner: *** Snacks: *** Drinks: ***  Within the past 12 months, did you worry  whether your food would run out before you got money to buy more? {YES NO:22349} Within the past 12 months, did the food you bought run out, and you didn't have money to get more? {YES NO:22349} PHQ-9 Score: ***  Patient-reported exercise habits: ***   O:   ROS  Physical Exam  7 day average blood glucose: ***  *** CGM Download:  % Time CGM is active: ***% Average Glucose: *** mg/dL Glucose Management Indicator: ***  Glucose Variability: *** (goal <36%) Time in Goal:  - Time in range 70-180: ***% - Time above range: ***% - Time below range: ***% Observed patterns:   Lab Results  Component Value Date   HGBA1C 10.0 (A) 05/16/2022   There were no vitals filed for this visit.  Lipid Panel     Component Value Date/Time   CHOL 79 (L) 01/31/2022 1015   TRIG 113 01/31/2022 1015   HDL 36 (L) 01/31/2022 1015   CHOLHDL 2.2 01/31/2022 1015   CHOLHDL 4.6 05/19/2014 0625   VLDL 54 (H) 05/19/2014 0625   LDLCALC 22 01/31/2022 1015    Clinical Atherosclerotic Cardiovascular Disease (ASCVD): {YES/NO:21197} The ASCVD Risk score (Arnett DK, et al., 2019) failed to calculate for the following reasons:   The patient has a prior MI or stroke diagnosis   Patient is participating in a Managed Medicaid Plan:  {MM YES/NO:27447::"Yes"}   A/P: Diabetes longstanding *** currently ***. Patient is *** able to verbalize appropriate hypoglycemia management plan. Medication adherence appears ***. Control is suboptimal due to ***. -{Meds adjust:18428} basal insulin *** Lantus/Basaglar/Semglee (insulin glargine) *** Tresiba (insulin degludec) from *** units to *** units daily in the morning. Patient will continue to titrate 1 unit every *** days if fasting blood sugar > 100mg /dl until fasting blood sugars reach goal or  next visit.  -{Meds adjust:18428} rapid insulin *** Novolog (insulin aspart) *** Humalog (insulin lispro) from *** to ***.  -{Meds adjust:18428} GLP-1 *** Trulicity (dulaglutide) ***  Ozempic (semaglutide) *** Mounjaro (tirzepatide) from *** mg to *** mg .  -{Meds adjust:18428} SGLT2-I *** Farxiga (dapagliflozin) *** Jardiance (empagliflozin) 10 mg. Counseled on sick day rules. -{Meds adjust:18428} metformin ***.  -Patient educated on purpose, proper use, and potential adverse effects of ***.  -Extensively discussed pathophysiology of diabetes, recommended lifestyle interventions, dietary effects on blood sugar control.  -Counseled on s/sx of and management of hypoglycemia.  -Next A1c anticipated ***.   ASCVD risk - primary ***secondary prevention in patient with diabetes. Last LDL is *** not at goal of <81 *** mg/dL. ASCVD risk factors include *** and 10-year ASCVD risk score of ***. {Desc; low/moderate/high:110033} intensity statin indicated.  -{Meds adjust:18428} ***statin *** mg.   Hypertension longstanding *** currently ***. Blood pressure goal of <130/80 *** mmHg. Medication adherence ***. Blood pressure control is suboptimal due to ***. -{Meds adjust:18428} *** mg.  Written patient instructions provided. Patient verbalized understanding of treatment plan.  Total time in face to face counseling *** minutes.    Follow-up:  Pharmacist ***. PCP clinic visit in ***.  Patient seen with ***

## 2022-07-08 ENCOUNTER — Ambulatory Visit (HOSPITAL_BASED_OUTPATIENT_CLINIC_OR_DEPARTMENT_OTHER): Payer: Medicare Other | Admitting: Pharmacist

## 2022-07-08 ENCOUNTER — Other Ambulatory Visit: Payer: Self-pay

## 2022-07-08 ENCOUNTER — Encounter: Payer: Self-pay | Admitting: Pharmacist

## 2022-07-08 DIAGNOSIS — I10 Essential (primary) hypertension: Secondary | ICD-10-CM | POA: Insufficient documentation

## 2022-07-08 DIAGNOSIS — K219 Gastro-esophageal reflux disease without esophagitis: Secondary | ICD-10-CM | POA: Insufficient documentation

## 2022-07-08 DIAGNOSIS — Z794 Long term (current) use of insulin: Secondary | ICD-10-CM | POA: Insufficient documentation

## 2022-07-08 DIAGNOSIS — E1165 Type 2 diabetes mellitus with hyperglycemia: Secondary | ICD-10-CM | POA: Insufficient documentation

## 2022-07-08 DIAGNOSIS — Z8673 Personal history of transient ischemic attack (TIA), and cerebral infarction without residual deficits: Secondary | ICD-10-CM | POA: Insufficient documentation

## 2022-07-08 DIAGNOSIS — Z79899 Other long term (current) drug therapy: Secondary | ICD-10-CM | POA: Insufficient documentation

## 2022-07-08 DIAGNOSIS — E785 Hyperlipidemia, unspecified: Secondary | ICD-10-CM | POA: Insufficient documentation

## 2022-07-08 LAB — BPAM PLATELET PHERESIS
Blood Product Expiration Date: 202406142359
ISSUE DATE / TIME: 202406131420

## 2022-07-08 LAB — PREPARE PLATELET PHERESIS

## 2022-07-08 MED ORDER — ONETOUCH DELICA LANCETS 33G MISC
3 refills | Status: DC
Start: 2022-07-08 — End: 2022-09-01

## 2022-07-08 MED ORDER — ONETOUCH VERIO W/DEVICE KIT
PACK | 0 refills | Status: DC
Start: 2022-07-08 — End: 2022-09-01

## 2022-07-08 MED ORDER — INSULIN PEN NEEDLE 31G X 5 MM MISC
1.0000 | Freq: Four times a day (QID) | 6 refills | Status: DC
Start: 2022-07-08 — End: 2022-09-01

## 2022-07-08 MED ORDER — GLUCOSE BLOOD VI STRP
ORAL_STRIP | 12 refills | Status: DC
Start: 2022-07-08 — End: 2022-09-01

## 2022-07-08 NOTE — Progress Notes (Signed)
S:     No chief complaint on file.  67 y.o. female who presents for diabetes evaluation, education, and management.  PMH is significant for T2DM, HTN, HLD, hx stroke, breast cancer, and GERD.   Patient was referred and last seen by Primary Care Provider, NP Bertram Denver, on 05/16/2022. At that visit, A1c resulted at 10%, down from 10.7%. Semglee was increased from 20 units daily to 20 units BID. Humalog sliding scale insulin was added. Of note, BP was 123/58 mmHg at that visit. Last seen by pharmacy clinic on 6/6/204. Patient was adherent with her diabetes medications. Brought atorvastatin and pantoprazole to visit, but was unsure of which other oral medications she was taking.  Today, patient arrives in good spirits. Assistance provided by online interpreter: Burman Foster [phone]. She brings all her current medications that I note below. Patient reports not checking her blood sugars. In clinic sugars elevated (>400s) but denies nocturia, neuropathy, visual changes. Reports not having her Medicare Part B card, therefore does not have a sensor. If unable to get a sensor, patient complies with checking her sugars the conventional way.  Current diabetes medications include: humalog 10 units TID, basaglar 20 units BID (switched from semglee) Current hypertension medications include: losartan 50 mg daily (not taking), amlodipine 5 mg daily (4 open bottles) Current hyperlipidemia medications include: atorvastatin 40 mg daily, fenofibrate 145 mg daily  Family/Social History: Fhx: son - diabetes  O:  Lab Results  Component Value Date   HGBA1C 10.0 (A) 05/16/2022   There were no vitals filed for this visit.  Lipid Panel     Component Value Date/Time   CHOL 79 (L) 01/31/2022 1015   TRIG 113 01/31/2022 1015   HDL 36 (L) 01/31/2022 1015   CHOLHDL 2.2 01/31/2022 1015   CHOLHDL 4.6 05/19/2014 0625   VLDL 54 (H) 05/19/2014 0625   LDLCALC 22 01/31/2022 1015    Clinical Atherosclerotic  Cardiovascular Disease (ASCVD): Yes  The ASCVD Risk score (Arnett DK, et al., 2019) failed to calculate for the following reasons:   The patient has a prior MI or stroke diagnosis   Patient is participating in a Managed Medicaid Plan: No  A/P: Diabetes longstanding currently uncontrolled. Patient is able to verbalize appropriate hypoglycemia management plan. Medication adherence appears appropriate with her insulin. Control is suboptimal due to not having a sensor to check sugars and titrate insulin as needed. -Continued Basaglar 20 units BID and Humalog 20 units TID - In order to obtain libre sensor, patient needs to bring her red, white, and blue card to [facility]. Patient daughter was called and discussed the need of replacing the Medicare Part B card. Received verbal confirmation that once card is mailed to her she will take it to the pharmacy to get sensor filled.  -Patient educated on purpose, proper use, and potential adverse effects.  -Extensively discussed pathophysiology of diabetes, recommended lifestyle interventions, dietary effects on blood sugar control.  -Counseled on s/sx of and management of hypoglycemia.  -Next A1c anticipated 07/2022.   ASCVD risk - secondary prevention in patient with diabetes. Last LDL is 22 at goal of <70 mg/dL. ASCVD risk factors include diabetes and HTN high intensity statin indicated.  -Continued atorvastatin 40 mg.   Hypertension longstanding currently controlled. Blood pressure goal of <130/80, currently at 121/49 mmHg. Currently not taking losartan 50 and in clinic BPs have been under goal, diastolics < 60 mmHg. Patient denies hypotension symptoms. -Discontinued losartan 50 mg. -Decreased amlodipine 2.5 mg (take 1/2  tablet of 5 mg daily). Condensed open bottles into one bottle.   Written patient instructions provided. Patient verbalized understanding of treatment plan.  Total time in face to face counseling 30 minutes.    Follow-up:  Pharmacist  in 4 weeks.   Patient seen with  Alesia Banda, PharmD Candidate [employer]    Butch Penny, PharmD, [place], CPP Clinical Pharmacist [facility] [phone]

## 2022-07-09 ENCOUNTER — Other Ambulatory Visit: Payer: Self-pay

## 2022-07-09 ENCOUNTER — Inpatient Hospital Stay (HOSPITAL_COMMUNITY)
Admission: EM | Admit: 2022-07-09 | Discharge: 2022-07-13 | DRG: 602 | Disposition: A | Discharge status: Other Acute Inpatient Hospital | Payer: Medicare Other | Attending: Internal Medicine | Admitting: Internal Medicine

## 2022-07-09 ENCOUNTER — Encounter (HOSPITAL_COMMUNITY): Payer: Self-pay | Admitting: *Deleted

## 2022-07-09 ENCOUNTER — Emergency Department (HOSPITAL_COMMUNITY): Payer: Medicare Other

## 2022-07-09 DIAGNOSIS — Z888 Allergy status to other drugs, medicaments and biological substances status: Secondary | ICD-10-CM | POA: Diagnosis not present

## 2022-07-09 DIAGNOSIS — Z603 Acculturation difficulty: Secondary | ICD-10-CM | POA: Diagnosis present

## 2022-07-09 DIAGNOSIS — Z833 Family history of diabetes mellitus: Secondary | ICD-10-CM | POA: Diagnosis not present

## 2022-07-09 DIAGNOSIS — D8481 Immunodeficiency due to conditions classified elsewhere: Secondary | ICD-10-CM | POA: Diagnosis present

## 2022-07-09 DIAGNOSIS — L03211 Cellulitis of face: Secondary | ICD-10-CM | POA: Diagnosis not present

## 2022-07-09 DIAGNOSIS — R12 Heartburn: Secondary | ICD-10-CM | POA: Diagnosis present

## 2022-07-09 DIAGNOSIS — Z79899 Other long term (current) drug therapy: Secondary | ICD-10-CM

## 2022-07-09 DIAGNOSIS — T451X5A Adverse effect of antineoplastic and immunosuppressive drugs, initial encounter: Secondary | ICD-10-CM | POA: Diagnosis present

## 2022-07-09 DIAGNOSIS — D61818 Other pancytopenia: Secondary | ICD-10-CM | POA: Diagnosis present

## 2022-07-09 DIAGNOSIS — F32A Depression, unspecified: Secondary | ICD-10-CM | POA: Diagnosis present

## 2022-07-09 DIAGNOSIS — E1169 Type 2 diabetes mellitus with other specified complication: Secondary | ICD-10-CM | POA: Diagnosis present

## 2022-07-09 DIAGNOSIS — J9601 Acute respiratory failure with hypoxia: Secondary | ICD-10-CM | POA: Diagnosis not present

## 2022-07-09 DIAGNOSIS — H4030X0 Glaucoma secondary to eye trauma, unspecified eye, stage unspecified: Secondary | ICD-10-CM | POA: Diagnosis not present

## 2022-07-09 DIAGNOSIS — D709 Neutropenia, unspecified: Secondary | ICD-10-CM | POA: Diagnosis not present

## 2022-07-09 DIAGNOSIS — I10 Essential (primary) hypertension: Secondary | ICD-10-CM | POA: Diagnosis not present

## 2022-07-09 DIAGNOSIS — E785 Hyperlipidemia, unspecified: Secondary | ICD-10-CM | POA: Diagnosis present

## 2022-07-09 DIAGNOSIS — D61810 Antineoplastic chemotherapy induced pancytopenia: Secondary | ICD-10-CM | POA: Diagnosis present

## 2022-07-09 DIAGNOSIS — B9620 Unspecified Escherichia coli [E. coli] as the cause of diseases classified elsewhere: Secondary | ICD-10-CM | POA: Diagnosis not present

## 2022-07-09 DIAGNOSIS — Z8673 Personal history of transient ischemic attack (TIA), and cerebral infarction without residual deficits: Secondary | ICD-10-CM

## 2022-07-09 DIAGNOSIS — D46Z Other myelodysplastic syndromes: Secondary | ICD-10-CM | POA: Diagnosis present

## 2022-07-09 DIAGNOSIS — Z9012 Acquired absence of left breast and nipple: Secondary | ICD-10-CM | POA: Diagnosis not present

## 2022-07-09 DIAGNOSIS — R519 Headache, unspecified: Secondary | ICD-10-CM | POA: Diagnosis present

## 2022-07-09 DIAGNOSIS — E876 Hypokalemia: Secondary | ICD-10-CM | POA: Diagnosis present

## 2022-07-09 DIAGNOSIS — Z7189 Other specified counseling: Secondary | ICD-10-CM | POA: Diagnosis not present

## 2022-07-09 DIAGNOSIS — J329 Chronic sinusitis, unspecified: Secondary | ICD-10-CM | POA: Diagnosis not present

## 2022-07-09 DIAGNOSIS — D696 Thrombocytopenia, unspecified: Secondary | ICD-10-CM | POA: Diagnosis not present

## 2022-07-09 DIAGNOSIS — H05011 Cellulitis of right orbit: Secondary | ICD-10-CM

## 2022-07-09 DIAGNOSIS — L03213 Periorbital cellulitis: Principal | ICD-10-CM | POA: Diagnosis present

## 2022-07-09 DIAGNOSIS — K0889 Other specified disorders of teeth and supporting structures: Secondary | ICD-10-CM | POA: Diagnosis present

## 2022-07-09 DIAGNOSIS — Z794 Long term (current) use of insulin: Secondary | ICD-10-CM

## 2022-07-09 DIAGNOSIS — E119 Type 2 diabetes mellitus without complications: Secondary | ICD-10-CM | POA: Diagnosis not present

## 2022-07-09 DIAGNOSIS — R9389 Abnormal findings on diagnostic imaging of other specified body structures: Secondary | ICD-10-CM | POA: Diagnosis not present

## 2022-07-09 DIAGNOSIS — R9431 Abnormal electrocardiogram [ECG] [EKG]: Secondary | ICD-10-CM | POA: Diagnosis not present

## 2022-07-09 DIAGNOSIS — R5081 Fever presenting with conditions classified elsewhere: Secondary | ICD-10-CM | POA: Diagnosis not present

## 2022-07-09 DIAGNOSIS — Z853 Personal history of malignant neoplasm of breast: Secondary | ICD-10-CM | POA: Diagnosis not present

## 2022-07-09 DIAGNOSIS — R599 Enlarged lymph nodes, unspecified: Secondary | ICD-10-CM | POA: Diagnosis not present

## 2022-07-09 DIAGNOSIS — E1165 Type 2 diabetes mellitus with hyperglycemia: Secondary | ICD-10-CM | POA: Diagnosis present

## 2022-07-09 DIAGNOSIS — J189 Pneumonia, unspecified organism: Secondary | ICD-10-CM | POA: Diagnosis not present

## 2022-07-09 LAB — CBC WITH DIFFERENTIAL/PLATELET
Abs Immature Granulocytes: 0.02 10*3/uL (ref 0.00–0.07)
Basophils Absolute: 0 10*3/uL (ref 0.0–0.1)
Basophils Relative: 1 %
Eosinophils Absolute: 0 10*3/uL (ref 0.0–0.5)
Eosinophils Relative: 1 %
HCT: 25.2 % — ABNORMAL LOW (ref 36.0–46.0)
Hemoglobin: 8.6 g/dL — ABNORMAL LOW (ref 12.0–15.0)
Immature Granulocytes: 2 %
Lymphocytes Relative: 74 %
Lymphs Abs: 0.8 10*3/uL (ref 0.7–4.0)
MCH: 29.7 pg (ref 26.0–34.0)
MCHC: 34.1 g/dL (ref 30.0–36.0)
MCV: 86.9 fL (ref 80.0–100.0)
Monocytes Absolute: 0.1 10*3/uL (ref 0.1–1.0)
Monocytes Relative: 12 %
Neutro Abs: 0.1 10*3/uL — CL (ref 1.7–7.7)
Neutrophils Relative %: 10 %
Platelets: 10 10*3/uL — CL (ref 150–400)
RBC: 2.9 MIL/uL — ABNORMAL LOW (ref 3.87–5.11)
RDW: 13.9 % (ref 11.5–15.5)
WBC: 1 10*3/uL — CL (ref 4.0–10.5)
nRBC: 13.9 % — ABNORMAL HIGH (ref 0.0–0.2)

## 2022-07-09 LAB — I-STAT CREATININE, ED: Creatinine, Ser: 0.5 mg/dL (ref 0.44–1.00)

## 2022-07-09 LAB — BASIC METABOLIC PANEL
Anion gap: 10 (ref 5–15)
BUN: 14 mg/dL (ref 8–23)
CO2: 25 mmol/L (ref 22–32)
Calcium: 8.8 mg/dL — ABNORMAL LOW (ref 8.9–10.3)
Chloride: 97 mmol/L — ABNORMAL LOW (ref 98–111)
Creatinine, Ser: 0.6 mg/dL (ref 0.44–1.00)
GFR, Estimated: >60 mL/min (ref >60)
Glucose, Bld: 354 mg/dL — ABNORMAL HIGH (ref 70–99)
Potassium: 4.2 mmol/L (ref 3.5–5.1)
Sodium: 132 mmol/L — ABNORMAL LOW (ref 135–145)

## 2022-07-09 LAB — GLUCOSE, CAPILLARY
Glucose-Capillary: 302 mg/dL — ABNORMAL HIGH (ref 70–99)
Glucose-Capillary: 267 mg/dL — ABNORMAL HIGH (ref 70–99)

## 2022-07-09 LAB — BPAM PLATELET PHERESIS
ISSUE DATE / TIME: 202406152027
Unit Type and Rh: 6200

## 2022-07-09 LAB — PREPARE PLATELET PHERESIS

## 2022-07-09 LAB — TYPE AND SCREEN: ABO/RH(D): O POS

## 2022-07-09 MED ORDER — SODIUM CHLORIDE 0.9% IV SOLUTION: Freq: Once | INTRAVENOUS | Status: AC

## 2022-07-09 MED ORDER — ATORVASTATIN CALCIUM 40 MG PO TABS
40.0000 mg | ORAL_TABLET | Freq: Every day | ORAL | Status: DC
  Administered 2022-07-10 – 2022-07-13 (×4): 40 mg via ORAL
  Filled 2022-07-09 (×4): qty 1

## 2022-07-09 MED ORDER — VANCOMYCIN HCL IN DEXTROSE 1-5 GM/200ML-% IV SOLN
1000.0000 mg | Freq: Once | INTRAVENOUS | Status: AC
  Administered 2022-07-09: 1000 mg via INTRAVENOUS
  Filled 2022-07-09: qty 200

## 2022-07-09 MED ORDER — FLUTICASONE PROPIONATE 50 MCG/ACT NA SUSP: 2.0000 | Freq: Every day | NASAL | Status: DC

## 2022-07-09 MED ORDER — PANTOPRAZOLE SODIUM 40 MG PO TBEC
40.0000 mg | DELAYED_RELEASE_TABLET | Freq: Every day | ORAL | Status: DC
  Administered 2022-07-10 – 2022-07-13 (×4): 40 mg via ORAL
  Filled 2022-07-09 (×4): qty 1

## 2022-07-09 MED ORDER — INSULIN ASPART 100 UNIT/ML IJ SOLN
0.0000 [IU] | Freq: Three times a day (TID) | INTRAMUSCULAR | Status: DC
  Administered 2022-07-09: 11 [IU] via SUBCUTANEOUS

## 2022-07-09 MED ORDER — ONDANSETRON HCL 4 MG PO TABS: 8.0000 mg | ORAL_TABLET | Freq: Three times a day (TID) | ORAL | Status: DC | PRN

## 2022-07-09 MED ORDER — AMLODIPINE BESYLATE 5 MG PO TABS
5.0000 mg | ORAL_TABLET | Freq: Every day | ORAL | Status: DC
  Administered 2022-07-09 – 2022-07-13 (×5): 5 mg via ORAL
  Filled 2022-07-09 (×5): qty 1

## 2022-07-09 MED ORDER — PROCHLORPERAZINE MALEATE 10 MG PO TABS: 10.0000 mg | ORAL_TABLET | Freq: Four times a day (QID) | ORAL | Status: DC | PRN

## 2022-07-09 MED ORDER — SODIUM CHLORIDE 0.9 % IV SOLN
2.0000 g | Freq: Two times a day (BID) | INTRAVENOUS | Status: DC
  Administered 2022-07-10 – 2022-07-13 (×8): 2 g via INTRAVENOUS
  Filled 2022-07-09 (×8): qty 12.5

## 2022-07-09 MED ORDER — VANCOMYCIN HCL 1250 MG/250ML IV SOLN
1250.0000 mg | INTRAVENOUS | Status: DC
  Administered 2022-07-11: 1250 mg via INTRAVENOUS
  Filled 2022-07-09: qty 250

## 2022-07-09 MED ORDER — SODIUM CHLORIDE 0.9 % IV SOLN
2.0000 g | Freq: Once | INTRAVENOUS | Status: AC
  Administered 2022-07-09: 2 g via INTRAVENOUS
  Filled 2022-07-09: qty 12.5

## 2022-07-09 MED ORDER — SODIUM CHLORIDE 0.9 % IV BOLUS
1000.0000 mL | Freq: Once | INTRAVENOUS | Status: AC
  Administered 2022-07-09: 1000 mL via INTRAVENOUS

## 2022-07-09 MED ORDER — IOHEXOL 300 MG/ML  SOLN
75.0000 mL | Freq: Once | INTRAMUSCULAR | Status: AC | PRN
  Administered 2022-07-09: 75 mL via INTRAVENOUS

## 2022-07-09 MED ORDER — ACETAMINOPHEN 325 MG PO TABS
650.0000 mg | ORAL_TABLET | Freq: Four times a day (QID) | ORAL | Status: DC | PRN
  Administered 2022-07-09 – 2022-07-12 (×8): 650 mg via ORAL
  Filled 2022-07-09 (×8): qty 2

## 2022-07-09 MED ORDER — INSULIN ASPART 100 UNIT/ML IJ SOLN: 0.0000 [IU] | Freq: Three times a day (TID) | INTRAMUSCULAR | Status: DC

## 2022-07-09 MED ORDER — CITALOPRAM HYDROBROMIDE 20 MG PO TABS: 20.0000 mg | ORAL_TABLET | Freq: Every day | ORAL | Status: DC

## 2022-07-09 MED ORDER — FENOFIBRATE 160 MG PO TABS
160.0000 mg | ORAL_TABLET | Freq: Every day | ORAL | Status: DC
  Administered 2022-07-10 – 2022-07-13 (×4): 160 mg via ORAL
  Filled 2022-07-09 (×4): qty 1

## 2022-07-09 NOTE — ED Notes (Signed)
Secretary called to inform floor patient is on her way.

## 2022-07-09 NOTE — ED Triage Notes (Addendum)
Pt feels as if sinuses ae swollen, rt eye puffy and headache. Top gum pain. Has taken Tylenol yesterday. Pt has cancer and received platles on Thursday, Chemo monthly

## 2022-07-09 NOTE — Progress Notes (Addendum)
Pharmacy Note   A consult was received from an ED physician for vancomycin and cefepime per pharmacy dosing.    The patient's profile has been reviewed for ht/wt/allergies/indication/available labs.    A one time order has been placed for vancomycin 1000 mg IV x1 and cefepime 2 gr IV x1 .    Further antibiotics/pharmacy consults should be ordered by admitting physician if indicated.                       Thank you,  Adalberto Cole, PharmD, BCPS 07/09/2022 3:44 PM

## 2022-07-09 NOTE — H&P (Addendum)
History and Physical    Amanda Duarte:[id number] DOB: [birth year] DOA: 07/09/2022  PCP: Claiborne Rigg, NP Patient coming from: home  I have personally briefly reviewed patient's old medical records in [facility]  Chief Complaint: right facial swelling  HPI: Amanda Duarte is a 67 y.o. female with h/o hypertension, hyperlipidemia, insulin-dependent type 2 diabetes, myelodysplasia syndrome and chemotherapy twice a week, chronic thrombocytopenia and neutropenia presented to the ED with above complaints, denies eye pain, eye movement intact, no fever  ED Course:  Data reviewed:  Blood pressure (!) 154/62, pulse 81, temperature 97.8 F (36.6 C), temperature source Oral, resp. rate 18, height 5' (1.524 m), weight 53.5 kg, SpO2 95 %.  WBC 1.0, hemoglobin 8.6, platelet 10, neutrophil less than 1000, appear close to baseline Sodium 132, glucose 354, creatinine 0.6  CT maxillofacial with contrast : 1. Right periorbital soft tissue swelling without discrete abscess. This is consistent with acute cellulitis. 2. Chronic wall thickening in the maxillary sinuses bilaterally without acute disease.  EDP requested admission, I request blood culture to be obtained in the ED, then broad-spectrum antibiotic to be started,   Review of Systems: As per HPI otherwise all other systems reviewed and are negative.   Past Medical History:  Diagnosis Date   Cancer (HCC) 03/2017   left breast cancer   Diabetes mellitus without complication (HCC)    Hyperlipidemia    Hypertension    Malignant neoplasm of left breast (HCC)    Stroke (cerebrum) (HCC)    Tibial plateau fracture, left    04-13-17 had ORIF    Past Surgical History:  Procedure Laterality Date   MASTECTOMY Left 2019   MASTECTOMY MODIFIED RADICAL Left 11/20/2017   Procedure: LEFT MODIFIED RADICAL MASTECTOMY;  Surgeon: Claud Kelp, MD;  Location: [facility] OR;  Service: General;  Laterality: Left;   ORIF TIBIA PLATEAU Left 04/13/2017    Procedure: OPEN REDUCTION INTERNAL FIXATION (ORIF) TIBIAL PLATEAU;  Surgeon: Roby Lofts, MD;  Location: [facility] OR;  Service: Orthopedics;  Laterality: Left;   PORT-A-CATH REMOVAL Right 11/20/2017   Procedure: REMOVAL PORT-A-CATH;  Surgeon: Claud Kelp, MD;  Location: [facility] OR;  Service: General;  Laterality: Right;   PORTACATH PLACEMENT Right 05/31/2017   Procedure: INSERTION PORT-A-CATH;  Surgeon: Claud Kelp, MD;  Location: [facility];  Service: General;  Laterality: Right;    Social History  reports that she has never smoked. She has never used smokeless tobacco. She reports that she does not drink alcohol and does not use drugs.  Allergies  Allergen Reactions   Victoza [Liraglutide] Nausea And Vomiting    Family History  Problem Relation Age of Onset   Heart Problems Mother    Diabetes Son    Breast cancer Neg Hx    Colon cancer Neg Hx    Rectal cancer Neg Hx    Stomach cancer Neg Hx    Esophageal cancer Neg Hx     Prior to Admission medications   Medication Sig Start Date End Date Taking? Authorizing Provider  amLODipine (NORVASC) 5 MG tablet Take 1 tablet (5 mg total) by mouth daily. For blood pressure 05/16/22   Claiborne Rigg, NP  atorvastatin (LIPITOR) 40 MG tablet Take 1 tablet (40 mg total) by mouth daily. For cholesterol 01/04/22   Claiborne Rigg, NP  Blood Glucose Monitoring Suppl (ONETOUCH VERIO) w/Device KIT Use to check blood sugar three times daily. E11.65 07/08/22   Hoy Register, MD  Blood Pressure Monitor  DEVI Please provide patient with insurance approved blood pressure monitor 01/14/19   Claiborne Rigg, NP  citalopram (CELEXA) 20 MG tablet Take 1 tablet (20 mg total) by mouth daily. For depression 01/04/22   Claiborne Rigg, NP  Continuous Glucose Receiver (FREESTYLE LIBRE READER) DEVI Monitor blood glucose levels 5-6 times per day E11.65 05/16/22   Claiborne Rigg, NP  Continuous Glucose Sensor (FREESTYLE LIBRE 2 SENSOR) MISC Monitor  blood glucose levels 5-6 times per day E11.65 05/16/22   Claiborne Rigg, NP  fenofibrate (TRICOR) 145 MG tablet Take 1 tablet (145 mg total) by mouth daily. For cholesterol 01/04/22   Claiborne Rigg, NP  fluticasone (FLONASE) 50 MCG/ACT nasal spray Place 2 sprays into both nostrils daily. 01/04/22   Claiborne Rigg, NP  glucose blood test strip Use to check blood sugar three times daily. E11.65 07/08/22   Hoy Register, MD  HUMALOG KWIKPEN 100 UNIT/ML KwikPen Inject 10 Units into the skin 3 (three) times daily. 05/19/22   Hoy Register, MD  Insulin Glargine (BASAGLAR KWIKPEN) 100 UNIT/ML Inject 20 Units into the skin 2 (two) times daily. 05/17/22   Hoy Register, MD  Insulin Pen Needle 31G X 5 MM MISC Inject 1 Device into the skin QID. For use with insulin pens 07/08/22   Hoy Register, MD  Insulin Syringe-Needle U-100 30G X 5/16" 1 ML MISC Use as directed to inject into the skin 3 times daily. 01/04/22   Claiborne Rigg, NP  levofloxacin (LEVAQUIN) 500 MG tablet Take 1 tablet (500 mg total) by mouth daily. 05/26/22   Malachy Mood, MD  ondansetron (ZOFRAN) 8 MG tablet Take 1 tablet (8 mg total) by mouth every 8 (eight) hours as needed for nausea or vomiting. 11/10/21   Malachy Mood, MD  OneTouch Delica Lancets 33G MISC Use to check blood sugar three times daily. E11.65 07/08/22   Hoy Register, MD  pantoprazole (PROTONIX) 40 MG tablet Take 1 tablet (40 mg total) by mouth daily at 6 (six) AM. For heartburn 05/16/22   Claiborne Rigg, NP  potassium chloride SA (KLOR-CON M) 20 MEQ tablet Take 1 tablet (20 mEq total) by mouth daily. 01/04/22   Claiborne Rigg, NP  prochlorperazine (COMPAZINE) 10 MG tablet Take 1 tablet (10 mg total) by mouth every 6 (six) hours as needed for nausea or vomiting. 11/10/21   Malachy Mood, MD    Physical Exam: Vitals:   07/09/22 1345 07/09/22 1400 07/09/22 1415 07/09/22 1611  BP: (!) 154/63 (!) 153/60 (!) 154/62   Pulse: 79 81 81   Resp:   18   Temp:    (!) 100.5 F  (38.1 C)  TempSrc:    Oral  SpO2: 96% 97% 95%   Weight:      Height:        Constitutional: NAD, calm, comfortable Eyes: right lower periorbital cellulitis, eye movement intact ENMT: Mucous membranes are moist.  Respiratory: clear to auscultation bilaterally, no wheezing, no crackles. Normal respiratory effort. No accessory muscle use.  Cardiovascular: Regular rate and rhythm,  No extremity edema. 2+ pedal pulses. No carotid bruits.  Abdomen: no tenderness, not distended, Bowel sounds positive.  Musculoskeletal: no clubbing / cyanosis. No joint deformity upper and lower extremities. Good ROM, no contractures. Normal muscle tone.  Skin: no rashes, lesions, ulcers. No induration Neurologic: CN 2-12 grossly intact. Sensation intact, Strength 5/5 in all 4.  Psychiatric: Normal judgment and insight. Alert and oriented x 3. Normal mood.  Labs on Admission: I have personally reviewed following labs and imaging studies  CBC: Recent Labs  Lab 07/04/22 1305 07/07/22 1126 07/09/22 1259  WBC 1.0* 1.0* 1.0*  NEUTROABS 0.1* 0.1* 0.1*  HGB 7.4* 8.8* 8.6*  HCT 21.1* 25.3* 25.2*  MCV 85.4 85.5 86.9  PLT 11* 14* 10*    Basic Metabolic Panel: Recent Labs  Lab 07/09/22 1259 07/09/22 1313  NA 132*  --   K 4.2  --   CL 97*  --   CO2 25  --   GLUCOSE 354*  --   BUN 14  --   CREATININE 0.60 0.50  CALCIUM 8.8*  --     GFR: Estimated Creatinine Clearance: 49 mL/min (by C-G formula based on SCr of 0.5 mg/dL).  Liver Function Tests: No results for input(s): "AST", "ALT", "ALKPHOS", "BILITOT", "PROT", "ALBUMIN" in the last 168 hours.  Urine analysis:    Component Value Date/Time   COLORURINE YELLOW 01/29/2022 1604   APPEARANCEUR CLEAR 01/29/2022 1604   LABSPEC 1.014 01/29/2022 1604   PHURINE 6.0 01/29/2022 1604   GLUCOSEU >=500 (A) 01/29/2022 1604   HGBUR NEGATIVE 01/29/2022 1604   BILIRUBINUR NEGATIVE 01/29/2022 1604   BILIRUBINUR negative 02/15/2021 1330   KETONESUR  NEGATIVE 01/29/2022 1604   PROTEINUR NEGATIVE 01/29/2022 1604   UROBILINOGEN 0.2 02/15/2021 1330   UROBILINOGEN 1.0 04/04/2016 2002   NITRITE NEGATIVE 01/29/2022 1604   LEUKOCYTESUR NEGATIVE 01/29/2022 1604    Radiological Exams on Admission: CT Maxillofacial W Contrast  Result Date: 07/09/2022 CLINICAL DATA:  Swelling about the right eye. Headache. Personal history of breast cancer. EXAM: CT MAXILLOFACIAL WITH CONTRAST TECHNIQUE: Multidetector CT imaging of the maxillofacial structures was performed with intravenous contrast. Multiplanar CT image reconstructions were also generated. RADIATION DOSE REDUCTION: This exam was performed according to the departmental dose-optimization program which includes automated exposure control, adjustment of the mA and/or kV according to patient size and/or use of iterative reconstruction technique. CONTRAST:  75mL OMNIPAQUE IOHEXOL 300 MG/ML  SOLN COMPARISON:  CT head without contrast 04/11/2017 FINDINGS: Osseous: No acute or focal osseous abnormality is present. Orbits: Right periorbital soft tissue swelling is present. Findings are most prominent inferiorly and medially. No discrete abscess is present. The underlying globe is within normal limits. The lateral gland is unremarkable. Left globe and orbit are normal. Sinuses: Chronic wall thickening is present in the maxillary sinuses bilaterally. No acute disease is present. The paranasal sinuses and mastoid air cells are otherwise clear. Soft tissues: Soft tissues of the face and visualize neck are otherwise within normal limits. Limited intracranial: Within normal limits IMPRESSION: 1. Right periorbital soft tissue swelling without discrete abscess. This is consistent with acute cellulitis. 2. Chronic wall thickening in the maxillary sinuses bilaterally without acute disease. Electronically Signed   By: Marin Roberts M.D.   On: 07/09/2022 15:10      Assessment/Plan Principal Problem:   Periorbital  cellulitis Active Problems:   Essential hypertension   MDS (myelodysplastic syndrome), high grade (HCC)   Type 2 diabetes mellitus with hyperlipidemia (HCC)   Pancytopenia (HCC)   Thrombocytopenia (HCC)    Right periorbital cellulitis in an immunocompromised patient with history of MDS getting chemotherapy and uncontrolled diabetes -Blood culture obtained in the ED, continue broad-spectrum antibiotics, vanc/cefepime   IDDM2, uncontrolled with hyperglycemia -most recent A1c from April is 10% -home regimen: Basaglar 20 units BID and Humalog 20 units TID  -semgly 20units and ssi here for now  MDS/pancytopenia --Appears at baseline, supportive transfusion if indicated,  appear to be on prophylactic antibiotics if ANC less than 0.5 at home -last  blood transfusion on  6/11 and 6/13 -Last chemotherapy with azacitidine on 6/7 -appear in the process of getting referral to see leukemia specialist  Dr Sharyne Richters at [facility]  -oncology Dr Mosetta Putt consulted, gcsf is not recommended in the setting of MDS, recommend keep plt > 20 will give plt transfusion, repeat cbc  HTN/HLD continue home meds ( please see pharmacy note from 6/14 under chart review) Norvasc recently decreased to 2.5mg , atorvastatin 40 mg daily, fenofibrate 145 mg daily   DVT prophylaxis: SCDs   Code Status:  full  Family Communication:  Granddaughter at bedside   Patient is from: home   Anticipated DC to: home   Anticipated DC date:  48-72hrs,    Consults called:  Oncology Dr Mosetta Putt  Admission status:  inpatient  Severity of Illness:   The appropriate patient status for this patient is INPATIENT due to history and comorbidities, severity of illness, required intensity of service to ensure the patient's safety and to avoid risk of adverse events/further clinical deterioration.  Severity of illness/comorbidities: periortital cellulitis in immunosuppressed patient with mds/ pancytopnea  Intensity of service: tests, high  frequency of surveillance, interventions It is not anticipated that the patient will be medically stable for discharge from the hospital within 2 midnights of admission.    Voice Recognition Reubin Milan dictation system was used to create this note, attempts have been made to correct errors. Please contact the author with questions and/or clarifications.  Albertine Grates MD PhD FACP [facility]  How to contact the [facility] Attending or Consulting provider 7A - 7P or covering provider during after hours 7P -7A, for this patient?   Check the care team in [facility] and look for a) attending/consulting [facility] provider listed and b) the [facility] team listed Log into [email or web address] and use [place]'s universal password to access. If you do not have the password, please contact the hospital operator. Locate the [facility] provider you are looking for under [facility] and page to a number that you can be directly reached. If you still have difficulty reaching the provider, please page the [facility] (Director on Call) for the Hospitalists listed on amion for assistance.  07/09/2022, 4:29 PM

## 2022-07-09 NOTE — ED Notes (Signed)
Patient transported to CT 

## 2022-07-09 NOTE — Progress Notes (Signed)
Pharmacy Antibiotic Note  Amanda Duarte is a 67 y.o. female admitted on 07/09/2022 with history of myelodysplastic syndrome on chemotherapy presenting to the ED for evaluation of facial swelling. CT maxillofacial revealed facial cellulitis. [name]  Pharmacy has been consulted for vancomycin and cefepime dosing.  1st doses given in the ED  Plan: Vancomycin 1250mg  IV q36h (AUC 466.8, Scr 0.8) Cefepime 2gm IV q12h Follow renal function, cultures and clinical course  Height: 5' (152.4 cm) Weight: 55 kg (121 lb 4.8 oz) IBW/kg (Calculated) : 45.5  Temp (24hrs), Avg:99.5 F (37.5 C), Min:97.8 F (36.6 C), Max:101 F (38.3 C)  Recent Labs  Lab 07/04/22 1305 07/07/22 1126 07/09/22 1259 07/09/22 1313  WBC 1.0* 1.0* 1.0*  --   CREATININE  --   --  0.60 0.50    Estimated Creatinine Clearance: 53.1 mL/min (by C-G formula based on SCr of 0.5 mg/dL).    No Known Allergies  Antimicrobials this admission: 6/15 vanc >> 6/15 cefepime >>  Dose adjustments this admission:   Microbiology results: 6/15 BCx:   Thank you for allowing pharmacy to be a part of this patient's care.  Arley Phenix RPh 07/09/2022, 9:43 PM

## 2022-07-09 NOTE — ED Notes (Signed)
ED TO INPATIENT HANDOFF REPORT  ED Nurse Name and Phone #: Crist Infante, RN [phone]  S Name/Age/Gender Amanda Duarte 67 y.o. female Room/Bed: WA15/WA15  Code Status   Code Status: Full Code  Home/SNF/Other Home Patient oriented to: self, place, time, and situation Is this baseline? Yes   Triage Complete: Triage complete  Chief Complaint Periorbital cellulitis [L03.213]  Triage Note Pt feels as if sinuses ae swollen, rt eye puffy and headache. Top gum pain. Has taken Tylenol yesterday. Pt has cancer and received platles on Thursday, Chemo monthly   Allergies Allergies  Allergen Reactions   Victoza [Liraglutide] Nausea And Vomiting    Level of Care/Admitting Diagnosis ED Disposition     ED Disposition  Admit   Condition  --   Comment  Hospital Area: [facility] [[id number]]  Level of Care: Med-Surg [16]  May admit patient to [facility] or [facility] if equivalent level of care is available:: No  Covid Evaluation: Asymptomatic - no recent exposure (last 10 days) testing not required  Diagnosis: Periorbital cellulitis [469629]  Admitting Physician: Albertine Grates [[id number]]  Attending Physician: Albertine Grates [[id number]]  Certification:: I certify this patient will need inpatient services for at least 2 midnights  Estimated Length of Stay: 2          B Medical/Surgery History Past Medical History:  Diagnosis Date   Cancer (HCC) 03/2017   left breast cancer   Diabetes mellitus without complication (HCC)    Hyperlipidemia    Hypertension    Malignant neoplasm of left breast (HCC)    Stroke (cerebrum) (HCC)    Tibial plateau fracture, left    04-13-17 had ORIF   Past Surgical History:  Procedure Laterality Date   MASTECTOMY Left 2019   MASTECTOMY MODIFIED RADICAL Left 11/20/2017   Procedure: LEFT MODIFIED RADICAL MASTECTOMY;  Surgeon: Claud Kelp, MD;  Location: [facility] OR;  Service: General;  Laterality: Left;   ORIF TIBIA PLATEAU Left 04/13/2017   Procedure:  OPEN REDUCTION INTERNAL FIXATION (ORIF) TIBIAL PLATEAU;  Surgeon: Roby Lofts, MD;  Location: [facility] OR;  Service: Orthopedics;  Laterality: Left;   PORT-A-CATH REMOVAL Right 11/20/2017   Procedure: REMOVAL PORT-A-CATH;  Surgeon: Claud Kelp, MD;  Location: [facility] OR;  Service: General;  Laterality: Right;   PORTACATH PLACEMENT Right 05/31/2017   Procedure: INSERTION PORT-A-CATH;  Surgeon: Claud Kelp, MD;  Location: [facility];  Service: General;  Laterality: Right;     A IV Location/Drains/Wounds Patient Lines/Drains/Airways Status     Active Line/Drains/Airways     Name Placement date Placement time Site Days   Peripheral IV 07/09/22 1" Right Antecubital 07/09/22  1304  Antecubital  less than 1            Intake/Output Last 24 hours No intake or output data in the 24 hours ending 07/09/22 1620  Labs/Imaging Results for orders placed or performed during the hospital encounter of 07/09/22 (from the past 48 hour(s))  CBC with Differential     Status: Abnormal   Collection Time: 07/09/22 12:59 PM  Result Value Ref Range   WBC 1.0 (LL) 4.0 - 10.5 K/uL    Comment: REPEATED TO VERIFY THIS CRITICAL RESULT HAS VERIFIED AND BEEN CALLED TO SAVOIE,B. RN BY NICOLE MCCOY ON 06 15 2024 AT 1422, AND HAS BEEN READ BACK.     RBC 2.90 (L) 3.87 - 5.11 MIL/uL   Hemoglobin 8.6 (L) 12.0 - 15.0 g/dL   HCT 25.3 (L) 66.4 - 40.3 %  MCV 86.9 80.0 - 100.0 fL   MCH 29.7 26.0 - 34.0 pg   MCHC 34.1 30.0 - 36.0 g/dL   RDW 16.1 09.6 - 04.5 %   Platelets 10 (LL) 150 - 400 K/uL    Comment: SPECIMEN CHECKED FOR CLOTS REPEATED TO VERIFY PLATELET COUNT CONFIRMED BY SMEAR THIS CRITICAL RESULT HAS VERIFIED AND BEEN CALLED TO SAVOIE,B. RN BY NICOLE MCCOY ON 06 15 2024 AT 1422, AND HAS BEEN READ BACK.     nRBC 13.9 (H) 0.0 - 0.2 %   Neutrophils Relative % 10 %   Neutro Abs 0.1 (LL) 1.7 - 7.7 K/uL    Comment: REPEATED TO VERIFY THIS CRITICAL RESULT HAS VERIFIED AND BEEN CALLED TO  SAVOIE,B. RN BY NICOLE MCCOY ON 06 15 2024 AT 1422, AND HAS BEEN READ BACK.     Lymphocytes Relative 74 %   Lymphs Abs 0.8 0.7 - 4.0 K/uL   Monocytes Relative 12 %   Monocytes Absolute 0.1 0.1 - 1.0 K/uL   Eosinophils Relative 1 %   Eosinophils Absolute 0.0 0.0 - 0.5 K/uL   Basophils Relative 1 %   Basophils Absolute 0.0 0.0 - 0.1 K/uL   Immature Granulocytes 2 %   Abs Immature Granulocytes 0.02 0.00 - 0.07 K/uL   Polychromasia PRESENT     Comment: Performed at [facility], [address] [place], [place] [address]  Basic metabolic panel     Status: Abnormal   Collection Time: 07/09/22 12:59 PM  Result Value Ref Range   Sodium 132 (L) 135 - 145 mmol/L   Potassium 4.2 3.5 - 5.1 mmol/L   Chloride 97 (L) 98 - 111 mmol/L   CO2 25 22 - 32 mmol/L   Glucose, Bld 354 (H) 70 - 99 mg/dL    Comment: Glucose reference range applies only to samples taken after fasting for at least 8 hours.   BUN 14 8 - 23 mg/dL   Creatinine, Ser 1.91 0.44 - 1.00 mg/dL   Calcium 8.8 (L) 8.9 - 10.3 mg/dL   GFR, Estimated >47 >82 mL/min    Comment: (NOTE) Calculated using the CKD-EPI Creatinine Equation (2021)    Anion gap 10 5 - 15    Comment: Performed at [facility], [address], [place], [place] [address]  I-stat Creatinine, ED     Status: None   Collection Time: 07/09/22  1:13 PM  Result Value Ref Range   Creatinine, Ser 0.50 0.44 - 1.00 mg/dL   CT Maxillofacial W Contrast  Result Date: 07/09/2022 CLINICAL DATA:  Swelling about the right eye. Headache. Personal history of breast cancer. EXAM: CT MAXILLOFACIAL WITH CONTRAST TECHNIQUE: Multidetector CT imaging of the maxillofacial structures was performed with intravenous contrast. Multiplanar CT image reconstructions were also generated. RADIATION DOSE REDUCTION: This exam was performed according to the departmental dose-optimization program which includes automated exposure control, adjustment of the mA and/or  kV according to patient size and/or use of iterative reconstruction technique. CONTRAST:  75mL OMNIPAQUE IOHEXOL 300 MG/ML  SOLN COMPARISON:  CT head without contrast 04/11/2017 FINDINGS: Osseous: No acute or focal osseous abnormality is present. Orbits: Right periorbital soft tissue swelling is present. Findings are most prominent inferiorly and medially. No discrete abscess is present. The underlying globe is within normal limits. The lateral gland is unremarkable. Left globe and orbit are normal. Sinuses: Chronic wall thickening is present in the maxillary sinuses bilaterally. No acute disease is present. The paranasal sinuses and mastoid air cells  are otherwise clear. Soft tissues: Soft tissues of the face and visualize neck are otherwise within normal limits. Limited intracranial: Within normal limits IMPRESSION: 1. Right periorbital soft tissue swelling without discrete abscess. This is consistent with acute cellulitis. 2. Chronic wall thickening in the maxillary sinuses bilaterally without acute disease. Electronically Signed   By: Marin Roberts M.D.   On: 07/09/2022 15:10    Pending Labs Unresulted Labs (From admission, onward)     Start     Ordered   07/10/22 0500  CBC with Differential/Platelet  Tomorrow morning,   R        07/09/22 1605   07/10/22 0500  Basic metabolic panel  Tomorrow morning,   R        07/09/22 1605   07/09/22 1538  Culture, blood (routine x 2)  BLOOD CULTURE X 2,   R (with STAT occurrences)      07/09/22 1537            Vitals/Pain Today's Vitals   07/09/22 1345 07/09/22 1400 07/09/22 1415 07/09/22 1611  BP: (!) 154/63 (!) 153/60 (!) 154/62   Pulse: 79 81 81   Resp:   18   Temp:    (!) 100.5 F (38.1 C)  TempSrc:    Oral  SpO2: 96% 97% 95%   Weight:      Height:      PainSc:        Isolation Precautions No active isolations  Medications Medications  ceFEPIme (MAXIPIME) 2 g in sodium chloride 0.9 % 100 mL IVPB (2 g Intravenous New Bag/Given  07/09/22 1604)  vancomycin (VANCOCIN) IVPB 1000 mg/200 mL premix (has no administration in time range)  iohexol (OMNIPAQUE) 300 MG/ML solution 75 mL (75 mLs Intravenous Contrast Given 07/09/22 1441)  sodium chloride 0.9 % bolus 1,000 mL (1,000 mLs Intravenous New Bag/Given 07/09/22 1528)    Mobility walks     Focused Assessments Neuro Assessment Handoff:  Swallow screen pass?  N/A         Neuro Assessment: Within Defined Limits Neuro Checks:      Has TPA been given? No If patient is a Neuro Trauma and patient is going to OR before floor call report to 4N Charge nurse: [phone] or [phone]   R Recommendations: See Admitting Provider Note  Report given to:   Additional Notes:  Spanish Speaking

## 2022-07-09 NOTE — Progress Notes (Signed)
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  She is on chemotherapy, has chronic severe thrombocytopenia and neutropenia.  She presented to the emergency room with right-sided facial edema around right eye, and skin erythema, concerning for periorbital cellulitis.  She will be admitted to hospital for management today.  She denies any fever, or active bleeding.  She states that she had a tooth on the right side was removed a few years ago, and that she has had intermittent dental pain in the area.  She is supposed to be on prophylactic Levaquin for severe chronic neutropenia.   Objective:  Vitals:   07/09/22 1630 07/09/22 1630  BP: (!) 156/61   Pulse:    Resp:    Temp:  99.5 F (37.5 C)  SpO2:      Body mass index is 23.05 kg/m.  Intake/Output Summary (Last 24 hours) at 07/09/2022 1651 Last data filed at 07/09/2022 1634 Gross per 24 hour  Intake 1145.97 ml  Output --  Net 1145.97 ml     Sclerae unicteric, (+) edema and skin erythema around the right eye  Oropharynx clear  No peripheral adenopathy  Lungs clear -- no rales or rhonchi  Heart regular rate and rhythm  Abdomen benign  MSK no focal spinal tenderness, no peripheral edema  Neuro nonfocal    CBG (last 3)  No results for input(s): "GLUCAP" in the last 72 hours.   Labs:   Urine Studies No results for input(s): "UHGB", "CRYS" in the last 72 hours.  Invalid input(s): "UACOL", "UAPR", "USPG", "UPH", "UTP", "UGL", "UKET", "UBIL", "UNIT", "UROB", "ULEU", "UEPI", "UWBC", "URBC", "UBAC", "CAST", "UCOM", "BILUA"  Basic Metabolic Panel: Recent Labs  Lab 07/09/22 1259 07/09/22 1313  NA 132*  --   K 4.2  --   CL 97*  --   CO2 25  --   GLUCOSE 354*  --   BUN 14  --   CREATININE 0.60 0.50  CALCIUM 8.8*  --    GFR Estimated Creatinine Clearance: 49 mL/min (by C-G formula based on SCr of  0.5 mg/dL). Liver Function Tests: No results for input(s): "AST", "ALT", "ALKPHOS", "BILITOT", "PROT", "ALBUMIN" in the last 168 hours. No results for input(s): "LIPASE", "AMYLASE" in the last 168 hours. No results for input(s): "AMMONIA" in the last 168 hours. Coagulation profile No results for input(s): "INR", "PROTIME" in the last 168 hours.  CBC: Recent Labs  Lab 07/04/22 1305 07/07/22 1126 07/09/22 1259  WBC 1.0* 1.0* 1.0*  NEUTROABS 0.1* 0.1* 0.1*  HGB 7.4* 8.8* 8.6*  HCT 21.1* 25.3* 25.2*  MCV 85.4 85.5 86.9  PLT 11* 14* 10*   Cardiac Enzymes: No results for input(s): "CKTOTAL", "CKMB", "CKMBINDEX", "TROPONINI" in the last 168 hours. BNP: Invalid input(s): "POCBNP" CBG: No results for input(s): "GLUCAP" in the last 168 hours. D-Dimer No results for input(s): "DDIMER" in the last 72 hours. Hgb A1c No results for input(s): "HGBA1C" in the last 72 hours. Lipid Profile No results for input(s): "CHOL", "HDL", "LDLCALC", "TRIG", "CHOLHDL", "LDLDIRECT" in the last 72 hours. Thyroid function studies No results for input(s): "TSH", "T4TOTAL", "T3FREE", "THYROIDAB" in the last 72 hours.  Invalid input(s): "FREET3" Anemia work up No results for input(s): "VITAMINB12", "FOLATE", "FERRITIN", "TIBC", "IRON", "RETICCTPCT" in the last 72 hours. Microbiology No results found for this or any previous visit (from the past 240  hour(s)).    Studies:  CT Maxillofacial W Contrast  Result Date: 07/09/2022 CLINICAL DATA:  Swelling about the right eye. Headache. Personal history of breast cancer. EXAM: CT MAXILLOFACIAL WITH CONTRAST TECHNIQUE: Multidetector CT imaging of the maxillofacial structures was performed with intravenous contrast. Multiplanar CT image reconstructions were also generated. RADIATION DOSE REDUCTION: This exam was performed according to the departmental dose-optimization program which includes automated exposure control, adjustment of the mA and/or kV according to  patient size and/or use of iterative reconstruction technique. CONTRAST:  75mL OMNIPAQUE IOHEXOL 300 MG/ML  SOLN COMPARISON:  CT head without contrast 04/11/2017 FINDINGS: Osseous: No acute or focal osseous abnormality is present. Orbits: Right periorbital soft tissue swelling is present. Findings are most prominent inferiorly and medially. No discrete abscess is present. The underlying globe is within normal limits. The lateral gland is unremarkable. Left globe and orbit are normal. Sinuses: Chronic wall thickening is present in the maxillary sinuses bilaterally. No acute disease is present. The paranasal sinuses and mastoid air cells are otherwise clear. Soft tissues: Soft tissues of the face and visualize neck are otherwise within normal limits. Limited intracranial: Within normal limits IMPRESSION: 1. Right periorbital soft tissue swelling without discrete abscess. This is consistent with acute cellulitis. 2. Chronic wall thickening in the maxillary sinuses bilaterally without acute disease. Electronically Signed   By: Marin Roberts M.D.   On: 07/09/2022 15:10    Assessment: 67 y.o. 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 -Agree with hospital admission due to the high risk of sepsis  -Infection management per primary team -I do not recommend G-CSF due to her high-grade MDS -Consider blood transfusion for hemoglobin less than 8, and platelet less than 20.  If she has active bleeding, consider platelet transfusion with higher threshold. -I spoke with Dr. Roda Shutters. I will follow up as needed    Malachy Mood, MD 07/09/2022  4:51 PM

## 2022-07-09 NOTE — ED Provider Notes (Signed)
  Physical Exam  BP (!) 154/62   Pulse 81   Temp (!) 100.5 F (38.1 C) (Oral)   Resp 18   Ht 5' (1.524 m)   Wt 53.5 kg   SpO2 95%   BMI 23.05 kg/m   Physical Exam Vitals and nursing note reviewed.  Constitutional:      General: She is not in acute distress.    Appearance: Normal appearance. She is normal weight. She is not ill-appearing.  HENT:     Head: Normocephalic and atraumatic.     Comments: Right sided facial swelling Pulmonary:     Effort: Pulmonary effort is normal. No respiratory distress.  Abdominal:     General: Abdomen is flat.  Musculoskeletal:        General: Normal range of motion.     Cervical back: Neck supple.  Skin:    General: Skin is warm and dry.  Neurological:     Mental Status: She is alert and oriented to person, place, and time.  Psychiatric:        Mood and Affect: Mood normal.        Behavior: Behavior normal.     Procedures  Procedures  ED Course / MDM   Clinical Course as of 07/09/22 1614  Sat Jul 09, 2022  1537 Spoke with hospitalist who will admit for cellulitis in the setting of hyperglycemia, neutropenia [AS]    Clinical Course User Index [AS] Yoniel Arkwright, Edsel Petrin, PA-C   Medical Decision Making Amount and/or Complexity of Data Reviewed Labs: ordered. Radiology: ordered.  Risk Prescription drug management. Decision regarding hospitalization.  Assumed care at shift change from Floreen Comber, PA-C.  Please see her note for full HPI.  In short, 67 year old female with a history of myelodysplastic syndrome on chemotherapy presenting to the ED for evaluation of facial swelling.  CT maxillofacial revealed facial cellulitis.  Lab workup concerning for neutropenia and thrombocytopenia.  Patient will be admitted to hospitalist service with broad-spectrum antibiotics.  Patient is in agreement with this plan.  Stable at the time of admission.  Consultations Obtained:  I requested consultation with the hospitalist Dr. Roda Shutters,  and  discussed lab and imaging findings as well as pertinent plan - they recommend: broad spectrum antibiotics, blood cultures, admission  Note: Portions of this report may have been transcribed using voice recognition software. Every effort was made to ensure accuracy; however, inadvertent computerized transcription errors may still be present.    Michelle Piper, PA-C 07/09/22 1614    Virgina Norfolk, DO 07/09/22 2145

## 2022-07-09 NOTE — ED Provider Notes (Signed)
[place] EMERGENCY DEPARTMENT AT [facility] Provider Note   CSN: [id number] Arrival date & time: 07/09/22  1152     History  Chief Complaint  Patient presents with   Facial Pain    Amanda Duarte is a 67 y.o. female.  67 year old female with complaint of frontal headache, nose, right eye area with swelling below the eye, onset yesterday (progressive onset 8 months ago). 3-4 years ago had all teeth pulled, where the teeth were pulled- feels swollen and painful. No drainage, no trauma, no fevers.  Patient went to the doctor who did x-rays but they didn't do anything, this was about 8 months ago.  Hx DM per patient, on chart review- seeing oncology for MDS, breast ca, pancytopenia.  A language interpreter was used (spanish).       Home Medications Prior to Admission medications   Medication Sig Start Date End Date Taking? Authorizing Provider  amLODipine (NORVASC) 5 MG tablet Take 1 tablet (5 mg total) by mouth daily. For blood pressure 05/16/22   Claiborne Rigg, NP  atorvastatin (LIPITOR) 40 MG tablet Take 1 tablet (40 mg total) by mouth daily. For cholesterol 01/04/22   Claiborne Rigg, NP  Blood Glucose Monitoring Suppl (ONETOUCH VERIO) w/Device KIT Use to check blood sugar three times daily. E11.65 07/08/22   Hoy Register, MD  Blood Pressure Monitor DEVI Please provide patient with insurance approved blood pressure monitor 01/14/19   Claiborne Rigg, NP  citalopram (CELEXA) 20 MG tablet Take 1 tablet (20 mg total) by mouth daily. For depression 01/04/22   Claiborne Rigg, NP  Continuous Glucose Receiver (FREESTYLE LIBRE READER) DEVI Monitor blood glucose levels 5-6 times per day E11.65 05/16/22   Claiborne Rigg, NP  Continuous Glucose Sensor (FREESTYLE LIBRE 2 SENSOR) MISC Monitor blood glucose levels 5-6 times per day E11.65 05/16/22   Claiborne Rigg, NP  fenofibrate (TRICOR) 145 MG tablet Take 1 tablet (145 mg total) by mouth daily. For cholesterol 01/04/22    Claiborne Rigg, NP  fluticasone (FLONASE) 50 MCG/ACT nasal spray Place 2 sprays into both nostrils daily. 01/04/22   Claiborne Rigg, NP  glucose blood test strip Use to check blood sugar three times daily. E11.65 07/08/22   Hoy Register, MD  HUMALOG KWIKPEN 100 UNIT/ML KwikPen Inject 10 Units into the skin 3 (three) times daily. 05/19/22   Hoy Register, MD  Insulin Glargine (BASAGLAR KWIKPEN) 100 UNIT/ML Inject 20 Units into the skin 2 (two) times daily. 05/17/22   Hoy Register, MD  Insulin Pen Needle 31G X 5 MM MISC Inject 1 Device into the skin QID. For use with insulin pens 07/08/22   Hoy Register, MD  Insulin Syringe-Needle U-100 30G X 5/16" 1 ML MISC Use as directed to inject into the skin 3 times daily. 01/04/22   Claiborne Rigg, NP  levofloxacin (LEVAQUIN) 500 MG tablet Take 1 tablet (500 mg total) by mouth daily. 05/26/22   Malachy Mood, MD  ondansetron (ZOFRAN) 8 MG tablet Take 1 tablet (8 mg total) by mouth every 8 (eight) hours as needed for nausea or vomiting. 11/10/21   Malachy Mood, MD  OneTouch Delica Lancets 33G MISC Use to check blood sugar three times daily. E11.65 07/08/22   Hoy Register, MD  pantoprazole (PROTONIX) 40 MG tablet Take 1 tablet (40 mg total) by mouth daily at 6 (six) AM. For heartburn 05/16/22   Claiborne Rigg, NP  potassium chloride SA (KLOR-CON M)  20 MEQ tablet Take 1 tablet (20 mEq total) by mouth daily. 01/04/22   Claiborne Rigg, NP  prochlorperazine (COMPAZINE) 10 MG tablet Take 1 tablet (10 mg total) by mouth every 6 (six) hours as needed for nausea or vomiting. 11/10/21   Malachy Mood, MD      Allergies    Victoza [liraglutide]    Review of Systems   Review of Systems Negative except as per HPI Physical Exam Updated Vital Signs BP (!) 178/61 (BP Location: Right Arm)   Pulse 80   Temp 97.8 F (36.6 C) (Oral)   Resp 18   Ht 5' (1.524 m)   Wt 53.5 kg   SpO2 100%   BMI 23.05 kg/m  Physical Exam Vitals and nursing note reviewed.   Constitutional:      General: She is not in acute distress.    Appearance: She is well-developed. She is not diaphoretic.  HENT:     Head: Atraumatic.     Jaw: No trismus.      Nose: Congestion present.     Mouth/Throat:     Mouth: Mucous membranes are moist.     Comments: Edentulous, right upper edema without obvious abscess  Eyes:     Conjunctiva/sclera: Conjunctivae normal.  Pulmonary:     Effort: Pulmonary effort is normal.  Musculoskeletal:     Cervical back: Neck supple.  Lymphadenopathy:     Cervical: No cervical adenopathy.  Skin:    General: Skin is warm and dry.     Findings: No erythema or rash.  Neurological:     Mental Status: She is alert and oriented to person, place, and time.  Psychiatric:        Behavior: Behavior normal.     ED Results / Procedures / Treatments   Labs (all labs ordered are listed, but only abnormal results are displayed) Labs Reviewed  CBC WITH DIFFERENTIAL/PLATELET - Abnormal; Notable for the following components:      Result Value   WBC 1.0 (*)    RBC 2.90 (*)    Hemoglobin 8.6 (*)    HCT 25.2 (*)    Platelets 10 (*)    nRBC 13.9 (*)    Neutro Abs 0.1 (*)    All other components within normal limits  BASIC METABOLIC PANEL - Abnormal; Notable for the following components:   Sodium 132 (*)    Chloride 97 (*)    Glucose, Bld 354 (*)    Calcium 8.8 (*)    All other components within normal limits  I-STAT CREATININE, ED    EKG None  Radiology CT Maxillofacial W Contrast  Result Date: 07/09/2022 CLINICAL DATA:  Swelling about the right eye. Headache. Personal history of breast cancer. EXAM: CT MAXILLOFACIAL WITH CONTRAST TECHNIQUE: Multidetector CT imaging of the maxillofacial structures was performed with intravenous contrast. Multiplanar CT image reconstructions were also generated. RADIATION DOSE REDUCTION: This exam was performed according to the departmental dose-optimization program which includes automated exposure  control, adjustment of the mA and/or kV according to patient size and/or use of iterative reconstruction technique. CONTRAST:  75mL OMNIPAQUE IOHEXOL 300 MG/ML  SOLN COMPARISON:  CT head without contrast 04/11/2017 FINDINGS: Osseous: No acute or focal osseous abnormality is present. Orbits: Right periorbital soft tissue swelling is present. Findings are most prominent inferiorly and medially. No discrete abscess is present. The underlying globe is within normal limits. The lateral gland is unremarkable. Left globe and orbit are normal. Sinuses: Chronic wall thickening is present in  the maxillary sinuses bilaterally. No acute disease is present. The paranasal sinuses and mastoid air cells are otherwise clear. Soft tissues: Soft tissues of the face and visualize neck are otherwise within normal limits. Limited intracranial: Within normal limits IMPRESSION: 1. Right periorbital soft tissue swelling without discrete abscess. This is consistent with acute cellulitis. 2. Chronic wall thickening in the maxillary sinuses bilaterally without acute disease. Electronically Signed   By: Marin Roberts M.D.   On: 07/09/2022 15:10    Procedures Procedures    Medications Ordered in ED Medications  sodium chloride 0.9 % bolus 1,000 mL (has no administration in time range)  iohexol (OMNIPAQUE) 300 MG/ML solution 75 mL (75 mLs Intravenous Contrast Given 07/09/22 1441)    ED Course/ Medical Decision Making/ A&P                             Medical Decision Making Amount and/or Complexity of Data Reviewed Labs: ordered. Radiology: ordered.  Risk Prescription drug management.   This patient presents to the ED for concern of right-sided facial swelling, this involves an extensive number of treatment options, and is a complaint that carries with it a high risk of complications and morbidity.  The differential diagnosis includes but not limited to abscess, cellulitis   Co morbidities that complicate the  patient evaluation  Diabetes, MDS, breast cancer   Additional history obtained:  External records from outside source obtained and reviewed including office visit to oncology dated 06/23/2022    Lab Tests:  I Ordered, and personally interpreted labs.  The pertinent results include: BMP with hyperglycemia, 354 glucose and diabetic, nonfasting.  CBC concerning for white count of 1.0, platelets less than 10, neutrophils 0.1.  Hemoglobin is 8.6.   Imaging Studies ordered:  I ordered imaging studies including ct max/fac with contrast  I independently visualized and interpreted imaging which showed cellulitis  I agree with the radiologist interpretation  Consultations Obtained:  Pending consult at time of shift change for admission.  Problem List / ED Course / Critical interventions / Medication management  67 year old female presents with complaint of right facial pain and swelling.  Notes that she had all of her teeth removed several years ago and questions if she has infection in her gums.  Also reports frontal headache and pain into her nose.  She has swelling and tenderness to the right maxillary area, extraocular movements are intact.  Mild swelling the right upper gingiva.  CT maxillofacial reveals cellulitis.  CBC concerning for pancytopenia.  Patient is followed by oncology for MDS, breast cancer,.  Plan is for patient to be admitted to the hospital regularly scheduled transfusions Currently receives due to concern for her immune compromised state and facial infection.  Care signed out at change of shift pending consult to the hospitalist for admission.  Social Determinants of Health:  Has specialty care team   Test / Admission - Considered:  Admit- neutropenic, diabetes, facial cellulitis           Final Clinical Impression(s) / ED Diagnoses Final diagnoses:  Facial pain  Pancytopenia (HCC)  Cellulitis, face  Neutropenia, unspecified type [facility])    Rx / DC  Orders ED Discharge Orders     None         Jeannie Fend, PA-C 07/09/22 1522    Virgina Norfolk, DO 07/09/22 2145

## 2022-07-10 DIAGNOSIS — D61818 Other pancytopenia: Secondary | ICD-10-CM

## 2022-07-10 DIAGNOSIS — R519 Headache, unspecified: Secondary | ICD-10-CM

## 2022-07-10 DIAGNOSIS — D709 Neutropenia, unspecified: Secondary | ICD-10-CM | POA: Diagnosis not present

## 2022-07-10 DIAGNOSIS — L03213 Periorbital cellulitis: Secondary | ICD-10-CM | POA: Diagnosis not present

## 2022-07-10 LAB — GLUCOSE, CAPILLARY
Glucose-Capillary: 278 mg/dL — ABNORMAL HIGH (ref 70–99)
Glucose-Capillary: 255 mg/dL — ABNORMAL HIGH (ref 70–99)
Glucose-Capillary: 204 mg/dL — ABNORMAL HIGH (ref 70–99)
Glucose-Capillary: 219 mg/dL — ABNORMAL HIGH (ref 70–99)

## 2022-07-10 LAB — HEMOGLOBIN A1C
Hgb A1c MFr Bld: 11.9 % — ABNORMAL HIGH (ref 4.8–5.6)
Mean Plasma Glucose: 294.83 mg/dL

## 2022-07-10 LAB — CBC WITH DIFFERENTIAL/PLATELET
Abs Immature Granulocytes: 0 10*3/uL (ref 0.00–0.07)
Basophils Absolute: 0 10*3/uL (ref 0.0–0.1)
Basophils Relative: 1 %
Eosinophils Absolute: 0 10*3/uL (ref 0.0–0.5)
Eosinophils Relative: 2 %
HCT: 23.8 % — ABNORMAL LOW (ref 36.0–46.0)
Hemoglobin: 8.2 g/dL — ABNORMAL LOW (ref 12.0–15.0)
Immature Granulocytes: 0 %
Lymphocytes Relative: 74 %
Lymphs Abs: 0.8 10*3/uL (ref 0.7–4.0)
MCH: 29.8 pg (ref 26.0–34.0)
MCHC: 34.5 g/dL (ref 30.0–36.0)
MCV: 86.5 fL (ref 80.0–100.0)
Monocytes Absolute: 0.1 10*3/uL (ref 0.1–1.0)
Monocytes Relative: 11 %
Neutro Abs: 0.1 10*3/uL — CL (ref 1.7–7.7)
Neutrophils Relative %: 12 %
Platelets: 19 10*3/uL — CL (ref 150–400)
RBC: 2.75 MIL/uL — ABNORMAL LOW (ref 3.87–5.11)
RDW: 13.9 % (ref 11.5–15.5)
WBC: 1.1 10*3/uL — CL (ref 4.0–10.5)
nRBC: 12.3 % — ABNORMAL HIGH (ref 0.0–0.2)

## 2022-07-10 LAB — BASIC METABOLIC PANEL
Anion gap: 8 (ref 5–15)
BUN: 11 mg/dL (ref 8–23)
CO2: 26 mmol/L (ref 22–32)
Calcium: 8.7 mg/dL — ABNORMAL LOW (ref 8.9–10.3)
Chloride: 99 mmol/L (ref 98–111)
Creatinine, Ser: 0.55 mg/dL (ref 0.44–1.00)
GFR, Estimated: >60 mL/min (ref >60)
Glucose, Bld: 304 mg/dL — ABNORMAL HIGH (ref 70–99)
Potassium: 3.4 mmol/L — ABNORMAL LOW (ref 3.5–5.1)
Sodium: 133 mmol/L — ABNORMAL LOW (ref 135–145)

## 2022-07-10 LAB — CULTURE, BLOOD (ROUTINE X 2): Special Requests: ADEQUATE

## 2022-07-10 MED ORDER — INSULIN ASPART 100 UNIT/ML IJ SOLN
4.0000 [IU] | Freq: Three times a day (TID) | INTRAMUSCULAR | Status: DC
  Administered 2022-07-10 – 2022-07-13 (×11): 4 [IU] via SUBCUTANEOUS

## 2022-07-10 MED ORDER — INSULIN DETEMIR 100 UNIT/ML ~~LOC~~ SOLN
20.0000 [IU] | Freq: Two times a day (BID) | SUBCUTANEOUS | Status: DC
  Administered 2022-07-10 – 2022-07-13 (×6): 20 [IU] via SUBCUTANEOUS
  Filled 2022-07-10 (×8): qty 0.2

## 2022-07-10 MED ORDER — POTASSIUM CHLORIDE CRYS ER 20 MEQ PO TBCR
40.0000 meq | EXTENDED_RELEASE_TABLET | Freq: Two times a day (BID) | ORAL | Status: AC
  Administered 2022-07-10 (×2): 40 meq via ORAL
  Filled 2022-07-10 (×2): qty 2

## 2022-07-10 MED ORDER — SODIUM CHLORIDE 0.9 % IV SOLN: INTRAVENOUS | Status: AC

## 2022-07-10 MED ORDER — INSULIN ASPART 100 UNIT/ML IJ SOLN
0.0000 [IU] | Freq: Three times a day (TID) | INTRAMUSCULAR | Status: DC
  Administered 2022-07-10: 8 [IU] via SUBCUTANEOUS
  Administered 2022-07-10: 5 [IU] via SUBCUTANEOUS
  Administered 2022-07-10: 8 [IU] via SUBCUTANEOUS
  Administered 2022-07-11 (×3): 3 [IU] via SUBCUTANEOUS
  Administered 2022-07-12: 8 [IU] via SUBCUTANEOUS
  Administered 2022-07-12: 2 [IU] via SUBCUTANEOUS
  Administered 2022-07-12: 3 [IU] via SUBCUTANEOUS
  Administered 2022-07-13: 2 [IU] via SUBCUTANEOUS
  Administered 2022-07-13: 3 [IU] via SUBCUTANEOUS
  Administered 2022-07-13: 2 [IU] via SUBCUTANEOUS

## 2022-07-10 MED ORDER — INSULIN ASPART 100 UNIT/ML IJ SOLN
0.0000 [IU] | Freq: Every day | INTRAMUSCULAR | Status: DC
  Administered 2022-07-10 – 2022-07-12 (×2): 2 [IU] via SUBCUTANEOUS

## 2022-07-10 MED ORDER — NAPHAZOLINE-GLYCERIN 0.012-0.25 % OP SOLN
1.0000 [drp] | Freq: Four times a day (QID) | OPHTHALMIC | Status: DC | PRN
  Administered 2022-07-10 – 2022-07-11 (×4): 2 [drp] via OPHTHALMIC
  Filled 2022-07-10: qty 15

## 2022-07-10 NOTE — Progress Notes (Signed)
[facility] PROGRESS NOTE    Progress Note  Amanda Duarte  ZOX:[id number] DOB: 03-12-1955 DOA: 07/09/2022 PCP: Claiborne Rigg, NP     Brief Narrative:   Amanda Duarte is an 67 y.o. female past medical history of essential hypertension insulin-dependent diabetes mellitus type 2, myelodysplastic syndrome on chemotherapy twice a week, thrombocytopenia neutropenia comes in with right facial swelling  Assessment/Plan:   Periorbital cellulitis in the setting of an immunocompromise patient with a history of MDS getting chemotherapy: Blood cultures were obtained in the ED. She was started on IV vancomycin and cefepime. Tmax of 101, her white count is 1.1.  Insulin-dependent uncontrolled diabetes mellitus type 2: With a last A1c in April of 10 Started on long-acting insulin plus sliding scale meal coverage. Check an A1c.  Pancytopenia/MDS: Last transfusion was on the 13th and last chemotherapy on 07/01/2022. Oncology Dr. Suzie Portela was consulted did not recommend Granix in the treatment of MDS. Status post 1 unit of packed platelets this morning is 19,000 today CBC tomorrow morning Try to keep hemoglobin greater than 8.  Essential hypertension Continue current home regimen.  Hypokalemia: Replete potassium orally recheck in the morning.    DVT prophylaxis: lovenox   Family Communication:NONE Status is: Inpatient Remains inpatient appropriate because: Right facial cellulitis    Code Status:     Code Status Orders  (From admission, onward)           Start     Ordered   07/09/22 1609  Full code  Continuous       Question:  By:  Answer:  Consent: discussion documented in EHR   07/09/22 1609           Code Status History     Date Active Date Inactive Code Status Order ID Comments User Context   12/02/2021 1021 12/22/2021 2309 Full Code [id number]  Teddy Spike, DO ED   10/21/2021 1636 10/26/2021 2009 Full Code [id number]  Teddy Spike, DO Inpatient   11/20/2017 1521  11/21/2017 2008 Full Code [id number]  Claud Kelp, MD Inpatient   04/11/2017 2339 04/15/2017 1910 Full Code [id number]  Hillary Bow, DO ED   05/19/2014 0444 05/19/2014 2102 Full Code [id number]  Glori Luis, MD Inpatient         IV Access:   Peripheral IV   Procedures and diagnostic studies:   CT Maxillofacial W Contrast  Result Date: 07/09/2022 CLINICAL DATA:  Swelling about the right eye. Headache. Personal history of breast cancer. EXAM: CT MAXILLOFACIAL WITH CONTRAST TECHNIQUE: Multidetector CT imaging of the maxillofacial structures was performed with intravenous contrast. Multiplanar CT image reconstructions were also generated. RADIATION DOSE REDUCTION: This exam was performed according to the departmental dose-optimization program which includes automated exposure control, adjustment of the mA and/or kV according to patient size and/or use of iterative reconstruction technique. CONTRAST:  75mL OMNIPAQUE IOHEXOL 300 MG/ML  SOLN COMPARISON:  CT head without contrast 04/11/2017 FINDINGS: Osseous: No acute or focal osseous abnormality is present. Orbits: Right periorbital soft tissue swelling is present. Findings are most prominent inferiorly and medially. No discrete abscess is present. The underlying globe is within normal limits. The lateral gland is unremarkable. Left globe and orbit are normal. Sinuses: Chronic wall thickening is present in the maxillary sinuses bilaterally. No acute disease is present. The paranasal sinuses and mastoid air cells are otherwise clear. Soft tissues: Soft tissues of the face and visualize neck are otherwise within normal limits. Limited intracranial: Within normal limits IMPRESSION: 1.  Right periorbital soft tissue swelling without discrete abscess. This is consistent with acute cellulitis. 2. Chronic wall thickening in the maxillary sinuses bilaterally without acute disease. Electronically Signed   By: Marin Roberts M.D.   On: 07/09/2022 15:10      Medical Consultants:   None.   Subjective:    [name] complaining of facial pain  Objective:    Vitals:   07/09/22 2046 07/09/22 2205 07/10/22 0156 07/10/22 0506  BP: (!) 134/48 (!) 126/50 (!) 153/60 (!) 135/53  Pulse: 79 79 85 88  Resp: 20 20 18 18   Temp: 100 F (37.8 C) 98.9 F (37.2 C) 99.7 F (37.6 C) 99.2 F (37.3 C)  TempSrc: Oral Oral Oral Oral  SpO2: 98% 95% 95% 94%  Weight:      Height:       SpO2: 94 %   Intake/Output Summary (Last 24 hours) at 07/10/2022 0751 Last data filed at 07/10/2022 0533 Gross per 24 hour  Intake 2485.45 ml  Output --  Net 2485.45 ml   Filed Weights   07/09/22 1158 07/09/22 1735  Weight: 53.5 kg 55 kg    Exam: General exam: In no acute distress. Respiratory system: Good air movement and clear to auscultation. Cardiovascular system: S1 & S2 heard, RRR. No JVD.  Gastrointestinal system: Abdomen is nondistended, soft and nontender.  Extremities: No pedal edema. Skin: No rashes, lesions or ulcers Psychiatry: Judgement and insight appear normal. Mood & affect appropriate.    Data Reviewed:    Labs: Basic Metabolic Panel: Recent Labs  Lab 07/09/22 1259 07/09/22 1313 07/10/22 0555  NA 132*  --  133*  K 4.2  --  3.4*  CL 97*  --  99  CO2 25  --  26  GLUCOSE 354*  --  304*  BUN 14  --  11  CREATININE 0.60 0.50 0.55  CALCIUM 8.8*  --  8.7*   GFR Estimated Creatinine Clearance: 53.1 mL/min (by C-G formula based on SCr of 0.55 mg/dL). Liver Function Tests: No results for input(s): "AST", "ALT", "ALKPHOS", "BILITOT", "PROT", "ALBUMIN" in the last 168 hours. No results for input(s): "LIPASE", "AMYLASE" in the last 168 hours. No results for input(s): "AMMONIA" in the last 168 hours. Coagulation profile No results for input(s): "INR", "PROTIME" in the last 168 hours. COVID-19 Labs  No results for input(s): "DDIMER", "FERRITIN", "LDH", "CRP" in the last 72 hours.  Lab Results  Component Value Date    SARSCOV2NAA NEGATIVE 01/29/2022   SARSCOV2NAA NEGATIVE 12/01/2021    CBC: Recent Labs  Lab 07/04/22 1305 07/07/22 1126 07/09/22 1259 07/10/22 0555  WBC 1.0* 1.0* 1.0* 1.1*  NEUTROABS 0.1* 0.1* 0.1* 0.1*  HGB 7.4* 8.8* 8.6* 8.2*  HCT 21.1* 25.3* 25.2* 23.8*  MCV 85.4 85.5 86.9 86.5  PLT 11* 14* 10* 19*   Cardiac Enzymes: No results for input(s): "CKTOTAL", "CKMB", "CKMBINDEX", "TROPONINI" in the last 168 hours. BNP (last 3 results) No results for input(s): "PROBNP" in the last 8760 hours. CBG: Recent Labs  Lab 07/09/22 1822 07/09/22 2026 07/10/22 0729  GLUCAP 302* 267* 278*   D-Dimer: No results for input(s): "DDIMER" in the last 72 hours. Hgb A1c: No results for input(s): "HGBA1C" in the last 72 hours. Lipid Profile: No results for input(s): "CHOL", "HDL", "LDLCALC", "TRIG", "CHOLHDL", "LDLDIRECT" in the last 72 hours. Thyroid function studies: No results for input(s): "TSH", "T4TOTAL", "T3FREE", "THYROIDAB" in the last 72 hours.  Invalid input(s): "FREET3" Anemia work up: No results for input(s): "  VITAMINB12", "FOLATE", "FERRITIN", "TIBC", "IRON", "RETICCTPCT" in the last 72 hours. Sepsis Labs: Recent Labs  Lab 07/04/22 1305 07/07/22 1126 07/09/22 1259 07/10/22 0555  WBC 1.0* 1.0* 1.0* 1.1*   Microbiology No results found for this or any previous visit (from the past 240 hour(s)).   Medications:    amLODipine  5 mg Oral Daily   atorvastatin  40 mg Oral Daily   fenofibrate  160 mg Oral Daily   insulin aspart  0-15 Units Subcutaneous TID WC   pantoprazole  40 mg Oral Q0600   Continuous Infusions:  ceFEPime (MAXIPIME) IV 2 g (07/10/22 0456)   [START ON 07/11/2022] vancomycin        LOS: 1 day   Marinda Elk  [facility]  07/10/2022, 7:51 AM

## 2022-07-10 NOTE — Plan of Care (Signed)

## 2022-07-10 NOTE — Progress Notes (Signed)
   07/10/22 2028  Vitals  Temp (!) 101.8 F (38.8 C)  Temp Source Oral  BP (!) 141/57  MAP (mmHg) 81  BP Location Right Arm  BP Method Automatic  Patient Position (if appropriate) Lying  Pulse Rate 91  Pulse Rate Source Monitor  Resp 18  MEWS COLOR  MEWS Score Color Yellow  Oxygen Therapy  SpO2 98 %  O2 Device Room Air  MEWS Score  MEWS Temp 2  MEWS Systolic 0  MEWS Pulse 0  MEWS RR 0  MEWS LOC 0  MEWS Score 2   On call Provider notified, Tylenol given.

## 2022-07-11 ENCOUNTER — Other Ambulatory Visit: Payer: Medicare Other

## 2022-07-11 ENCOUNTER — Ambulatory Visit: Payer: Medicare Other

## 2022-07-11 ENCOUNTER — Ambulatory Visit: Payer: Medicare Other | Admitting: Hematology

## 2022-07-11 ENCOUNTER — Inpatient Hospital Stay: Payer: Medicare Other

## 2022-07-11 DIAGNOSIS — L03211 Cellulitis of face: Secondary | ICD-10-CM | POA: Diagnosis not present

## 2022-07-11 DIAGNOSIS — D46Z Other myelodysplastic syndromes: Secondary | ICD-10-CM | POA: Diagnosis not present

## 2022-07-11 DIAGNOSIS — R519 Headache, unspecified: Secondary | ICD-10-CM | POA: Diagnosis not present

## 2022-07-11 DIAGNOSIS — D61818 Other pancytopenia: Secondary | ICD-10-CM | POA: Diagnosis not present

## 2022-07-11 DIAGNOSIS — L03213 Periorbital cellulitis: Secondary | ICD-10-CM | POA: Diagnosis not present

## 2022-07-11 LAB — CBC WITH DIFFERENTIAL/PLATELET
Abs Immature Granulocytes: 0 10*3/uL (ref 0.00–0.07)
Basophils Absolute: 0 10*3/uL (ref 0.0–0.1)
Basophils Relative: 2 %
Eosinophils Absolute: 0 10*3/uL (ref 0.0–0.5)
Eosinophils Relative: 0 %
HCT: 23.8 % — ABNORMAL LOW (ref 36.0–46.0)
Hemoglobin: 8.1 g/dL — ABNORMAL LOW (ref 12.0–15.0)
Immature Granulocytes: 0 %
Lymphocytes Relative: 74 %
Lymphs Abs: 0.7 10*3/uL (ref 0.7–4.0)
MCH: 30 pg (ref 26.0–34.0)
MCHC: 34 g/dL (ref 30.0–36.0)
MCV: 88.1 fL (ref 80.0–100.0)
Monocytes Absolute: 0.1 10*3/uL (ref 0.1–1.0)
Monocytes Relative: 9 %
Neutro Abs: 0.2 10*3/uL — CL (ref 1.7–7.7)
Neutrophils Relative %: 15 %
Platelets: 13 10*3/uL — CL (ref 150–400)
RBC: 2.7 MIL/uL — ABNORMAL LOW (ref 3.87–5.11)
RDW: 14 % (ref 11.5–15.5)
WBC: 1 10*3/uL — CL (ref 4.0–10.5)
nRBC: 14.1 % — ABNORMAL HIGH (ref 0.0–0.2)

## 2022-07-11 LAB — BASIC METABOLIC PANEL
Anion gap: 8 (ref 5–15)
BUN: 11 mg/dL (ref 8–23)
CO2: 23 mmol/L (ref 22–32)
Calcium: 8.8 mg/dL — ABNORMAL LOW (ref 8.9–10.3)
Chloride: 102 mmol/L (ref 98–111)
Creatinine, Ser: 0.41 mg/dL — ABNORMAL LOW (ref 0.44–1.00)
GFR, Estimated: >60 mL/min (ref >60)
Glucose, Bld: 186 mg/dL — ABNORMAL HIGH (ref 70–99)
Potassium: 3.9 mmol/L (ref 3.5–5.1)
Sodium: 133 mmol/L — ABNORMAL LOW (ref 135–145)

## 2022-07-11 LAB — CREATININE, SERUM
Creatinine, Ser: 0.44 mg/dL (ref 0.44–1.00)
GFR, Estimated: >60 mL/min (ref >60)

## 2022-07-11 LAB — BPAM PLATELET PHERESIS
Blood Product Expiration Date: 202406182359
Blood Product Expiration Date: 202406202359

## 2022-07-11 LAB — CULTURE, BLOOD (ROUTINE X 2): Culture: NO GROWTH

## 2022-07-11 LAB — GLUCOSE, CAPILLARY
Glucose-Capillary: 183 mg/dL — ABNORMAL HIGH (ref 70–99)
Glucose-Capillary: 189 mg/dL — ABNORMAL HIGH (ref 70–99)
Glucose-Capillary: 161 mg/dL — ABNORMAL HIGH (ref 70–99)
Glucose-Capillary: 98 mg/dL (ref 70–99)

## 2022-07-11 LAB — PREPARE PLATELET PHERESIS
Unit division: 0
Unit division: 0

## 2022-07-11 MED ORDER — SODIUM CHLORIDE 0.9% IV SOLUTION: Freq: Once | INTRAVENOUS | Status: AC

## 2022-07-11 MED ORDER — TRAMADOL HCL 50 MG PO TABS
25.0000 mg | ORAL_TABLET | Freq: Once | ORAL | Status: AC
  Administered 2022-07-11: 25 mg via ORAL
  Filled 2022-07-11: qty 1

## 2022-07-11 MED ORDER — TRAMADOL HCL 50 MG PO TABS
25.0000 mg | ORAL_TABLET | Freq: Four times a day (QID) | ORAL | Status: DC | PRN
  Administered 2022-07-11 – 2022-07-13 (×6): 25 mg via ORAL
  Filled 2022-07-11 (×6): qty 1

## 2022-07-11 MED ORDER — VANCOMYCIN HCL 750 MG/150ML IV SOLN
750.0000 mg | INTRAVENOUS | Status: DC
  Administered 2022-07-12 – 2022-07-13 (×2): 750 mg via INTRAVENOUS
  Filled 2022-07-11 (×2): qty 150

## 2022-07-11 MED ORDER — POLYVINYL ALCOHOL 1.4 % OP SOLN
2.0000 [drp] | OPHTHALMIC | Status: DC | PRN
  Administered 2022-07-11: 2 [drp] via OPHTHALMIC
  Filled 2022-07-11: qty 15

## 2022-07-11 MED ORDER — SODIUM CHLORIDE 0.9 % IV SOLN: INTRAVENOUS | Status: DC

## 2022-07-11 NOTE — Progress Notes (Signed)
[facility] PROGRESS NOTE    Progress Note  BURKLEE BI  WUJ:[id number] DOB: August 24, 1955 DOA: 07/09/2022 PCP: Claiborne Rigg, NP     Brief Narrative:   Amanda Duarte is an 67 y.o. female past medical history of essential hypertension insulin-dependent diabetes mellitus type 2, myelodysplastic syndrome on chemotherapy twice a week, thrombocytopenia neutropenia comes in with right facial swelling.  Overall swelling improved.  NO pain in eye or trouble with vision   Assessment/Plan:   Periorbital cellulitis in the setting of an immunocompromise patient with a history of MDS getting chemotherapy: Blood cultures were obtained in the ED. -CT scan: Right periorbital soft tissue swelling without discrete abscess. This is consistent with acute cellulitis. She was started on IV vancomycin and cefepime.  Insulin-dependent uncontrolled diabetes mellitus type 2: With a last A1c in April of 10 Started on long-acting insulin plus sliding scale meal coverage.  Pancytopenia/MDS: Last transfusion was on the 13th and last chemotherapy on 07/01/2022. Oncology Dr. Suzie Portela was consulted did not recommend Granix  Status post 1 unit of packed platelets  6/16 and again 6/17 -hemoglobin greater than 8.  Essential hypertension Continue current home regimen.  Hypokalemia: Replete     DVT prophylaxis: lovenox   Family Communication: at bedside Status is: Inpatient Remains inpatient appropriate because: Right facial cellulitis    Code Status: full    IV Access:   Peripheral IV   Procedures and diagnostic studies:   CT Maxillofacial W Contrast  Result Date: 07/09/2022 CLINICAL DATA:  Swelling about the right eye. Headache. Personal history of breast cancer. EXAM: CT MAXILLOFACIAL WITH CONTRAST TECHNIQUE: Multidetector CT imaging of the maxillofacial structures was performed with intravenous contrast. Multiplanar CT image reconstructions were also generated. RADIATION DOSE REDUCTION: This  exam was performed according to the departmental dose-optimization program which includes automated exposure control, adjustment of the mA and/or kV according to patient size and/or use of iterative reconstruction technique. CONTRAST:  75mL OMNIPAQUE IOHEXOL 300 MG/ML  SOLN COMPARISON:  CT head without contrast 04/11/2017 FINDINGS: Osseous: No acute or focal osseous abnormality is present. Orbits: Right periorbital soft tissue swelling is present. Findings are most prominent inferiorly and medially. No discrete abscess is present. The underlying globe is within normal limits. The lateral gland is unremarkable. Left globe and orbit are normal. Sinuses: Chronic wall thickening is present in the maxillary sinuses bilaterally. No acute disease is present. The paranasal sinuses and mastoid air cells are otherwise clear. Soft tissues: Soft tissues of the face and visualize neck are otherwise within normal limits. Limited intracranial: Within normal limits IMPRESSION: 1. Right periorbital soft tissue swelling without discrete abscess. This is consistent with acute cellulitis. 2. Chronic wall thickening in the maxillary sinuses bilaterally without acute disease. Electronically Signed   By: Marin Roberts M.D.   On: 07/09/2022 15:10     Medical Consultants:   oncology   Subjective:    Says swelling better Denies vision changes or pain with eye movement   Objective:    Vitals:   07/11/22 0150 07/11/22 0628 07/11/22 1018 07/11/22 1118  BP: (!) 145/55 (!) 142/60 (!) 137/57 (!) 125/45  Pulse: 75 71 79 79  Resp: 16 18 17 20   Temp: 98.3 F (36.8 C) 98.2 F (36.8 C) 98.6 F (37 C) 98 F (36.7 C)  TempSrc: Oral Oral Oral Oral  SpO2: 98% 99% 100% 97%  Weight:      Height:       SpO2: 97 %   Intake/Output Summary (  Last 24 hours) at 07/11/2022 1142 Last data filed at 07/11/2022 0725 Gross per 24 hour  Intake 2900.65 ml  Output --  Net 2900.65 ml   Filed Weights   07/09/22 1158 07/09/22  1735  Weight: 53.5 kg 55 kg    Exam:  General: Appearance:    Well developed, well nourished female in no acute distress   Swelling of right eye  Lungs:     respirations unlabored  Heart:    Normal heart rate. Normal rhythm. No murmurs, rubs, or gallops.   MS:   All extremities are intact.   Neurologic:   Awake, alert, oriented x 3. No apparent focal neurological           defect.       Data Reviewed:    Labs: Basic Metabolic Panel: Recent Labs  Lab 07/09/22 1259 07/09/22 1313 07/10/22 0555 07/11/22 0442 07/11/22 0810  NA 132*  --  133*  --  133*  K 4.2  --  3.4*  --  3.9  CL 97*  --  99  --  102  CO2 25  --  26  --  23  GLUCOSE 354*  --  304*  --  186*  BUN 14  --  11  --  11  CREATININE 0.60 0.50 0.55 0.44 0.41*  CALCIUM 8.8*  --  8.7*  --  8.8*   GFR Estimated Creatinine Clearance: 53.1 mL/min (A) (by C-G formula based on SCr of 0.41 mg/dL (L)). Liver Function Tests: No results for input(s): "AST", "ALT", "ALKPHOS", "BILITOT", "PROT", "ALBUMIN" in the last 168 hours. No results for input(s): "LIPASE", "AMYLASE" in the last 168 hours. No results for input(s): "AMMONIA" in the last 168 hours. Coagulation profile No results for input(s): "INR", "PROTIME" in the last 168 hours. COVID-19 Labs  No results for input(s): "DDIMER", "FERRITIN", "LDH", "CRP" in the last 72 hours.  Lab Results  Component Value Date   SARSCOV2NAA NEGATIVE 01/29/2022   SARSCOV2NAA NEGATIVE 12/01/2021    CBC: Recent Labs  Lab 07/04/22 1305 07/07/22 1126 07/09/22 1259 07/10/22 0555 07/11/22 0810  WBC 1.0* 1.0* 1.0* 1.1* 1.0*  NEUTROABS 0.1* 0.1* 0.1* 0.1* 0.2*  HGB 7.4* 8.8* 8.6* 8.2* 8.1*  HCT 21.1* 25.3* 25.2* 23.8* 23.8*  MCV 85.4 85.5 86.9 86.5 88.1  PLT 11* 14* 10* 19* 13*   Cardiac Enzymes: No results for input(s): "CKTOTAL", "CKMB", "CKMBINDEX", "TROPONINI" in the last 168 hours. BNP (last 3 results) No results for input(s): "PROBNP" in the last 8760  hours. CBG: Recent Labs  Lab 07/10/22 0729 07/10/22 1226 07/10/22 1714 07/10/22 2029 07/11/22 0842  GLUCAP 278* 255* 204* 219* 183*   D-Dimer: No results for input(s): "DDIMER" in the last 72 hours. Hgb A1c: Recent Labs    07/10/22 0948  HGBA1C 11.9*   Lipid Profile: No results for input(s): "CHOL", "HDL", "LDLCALC", "TRIG", "CHOLHDL", "LDLDIRECT" in the last 72 hours. Thyroid function studies: No results for input(s): "TSH", "T4TOTAL", "T3FREE", "THYROIDAB" in the last 72 hours.  Invalid input(s): "FREET3" Anemia work up: No results for input(s): "VITAMINB12", "FOLATE", "FERRITIN", "TIBC", "IRON", "RETICCTPCT" in the last 72 hours. Sepsis Labs: Recent Labs  Lab 07/07/22 1126 07/09/22 1259 07/10/22 0555 07/11/22 0810  WBC 1.0* 1.0* 1.1* 1.0*   Microbiology Recent Results (from the past 240 hour(s))  Culture, blood (routine x 2)     Status: None (Preliminary result)   Collection Time: 07/09/22  3:38 PM   Specimen: BLOOD  Result Value Ref Range  Status   Specimen Description   Final    BLOOD SITE NOT SPECIFIED Performed at [facility], [address], [place], [place] [address]    Special Requests   Final    BOTTLES DRAWN AEROBIC AND ANAEROBIC Blood Culture adequate volume Performed at [facility], [address] [place], [place] [address]    Culture   Final    NO GROWTH 2 DAYS Performed at [facility], [address], [place], [place] [address]    Report Status PENDING  Incomplete  Culture, blood (routine x 2)     Status: None (Preliminary result)   Collection Time: 07/09/22  3:43 PM   Specimen: BLOOD  Result Value Ref Range Status   Specimen Description   Final    BLOOD LEFT ANTECUBITAL Performed at [facility], [address] [place], [place] [address]    Special Requests   Final    BOTTLES DRAWN AEROBIC AND ANAEROBIC Blood Culture adequate volume Performed at [facility], [address], [place], [place] [address]    Culture   Final    NO GROWTH 2 DAYS Performed at [facility], [address], [place], [place] [address]    Report Status PENDING  Incomplete     Medications:    amLODipine  5 mg Oral Daily   atorvastatin  40 mg Oral Daily   fenofibrate  160 mg Oral Daily   insulin aspart  0-15 Units Subcutaneous TID WC   insulin aspart  0-5 Units Subcutaneous QHS   insulin aspart  4 Units Subcutaneous TID WC   insulin detemir  20 Units Subcutaneous BID   pantoprazole  40 mg Oral Q0600   Continuous Infusions:  sodium chloride     ceFEPime (MAXIPIME) IV Stopped (07/11/22 0412)   vancomycin Stopped (07/11/22 0551)      LOS: 2 days   Joseph Art  [facility]  07/11/2022, 11:42 AM

## 2022-07-11 NOTE — Plan of Care (Signed)
  Problem: Education: Goal: Ability to describe self-care measures that may prevent or decrease complications (Diabetes Survival Skills Education) will improve Outcome: Progressing   Problem: Coping: Goal: Ability to adjust to condition or change in health will improve Outcome: Progressing   Problem: Fluid Volume: Goal: Ability to maintain a balanced intake and output will improve Outcome: Progressing   Problem: Metabolic: Goal: Ability to maintain appropriate glucose levels will improve Outcome: Progressing   Problem: Clinical Measurements: Goal: Ability to maintain clinical measurements within normal limits will improve Outcome: Progressing Goal: Will remain free from infection Outcome: Progressing   Problem: Pain Managment: Goal: General experience of comfort will improve Outcome: Progressing   Problem: Safety: Goal: Ability to remain free from injury will improve Outcome: Progressing   Problem: Skin Integrity: Goal: Risk for impaired skin integrity will decrease Outcome: Progressing   Patient afebrile, MEWS green. VS stable. Continue pain management with tylenol and eyedrops with reports of relief this shift.  IV fluids and antibiotics well tolerated. Needs attended.

## 2022-07-11 NOTE — Progress Notes (Signed)
Pharmacy Antibiotic Note  Amanda Duarte is a 67 y.o. female with history of myelodysplastic syndrome on chemotherapy presenting to the ED  on 07/09/2022 with complaint of right sided facial swelling around right eye. CT of maxillofacial revealed no abscess but showed right periorbital acute cellulitis. She is currently on Vancomycin and Cefepime for periorbital cellulitis.   Today, 07/11/22; - Day 3 of antibiotics  - Blood cultures have been negative thus far - Scr: stable (crcl: 53.1 mL/min) - afebrile this AM  - WBC: low (1.0) , ANC: low (0.2)  Plan: - Adjust Vancomycin dose to 750 mg IV q24h (est. AUC 420)  - Cefepime 2 g every 12 hours   Height: 5' (152.4 cm) Weight: 55 kg (121 lb 4.8 oz) IBW/kg (Calculated) : 45.5  Temp (24hrs), Avg:99.2 F (37.3 C), Min:97.9 F (36.6 C), Max:101.8 F (38.8 C)  Recent Labs  Lab 07/04/22 1305 07/07/22 1126 07/09/22 1259 07/09/22 1313 07/10/22 0555 07/11/22 0442 07/11/22 0810  WBC 1.0* 1.0* 1.0*  --  1.1*  --  1.0*  CREATININE  --   --  0.60 0.50 0.55 0.44 0.41*    Estimated Creatinine Clearance: 53.1 mL/min (A) (by C-G formula based on SCr of 0.41 mg/dL (L)).    No Known Allergies  Antimicrobials this admission: 6/15 vanc >> 6/15 cefepime >>    Microbiology results: 6/15 Bcx:  pending    Thank you for allowing pharmacy to be a part of this patient's care.  Erasmo Leventhal, PharmD Candidate 07/11/2022 10:06 AM

## 2022-07-11 NOTE — Progress Notes (Signed)
Chaplain received a consult for assisting pt with creating advance directives.  Patient declined and stated she had not asked for this.  If there are other needs that we can assist with, please consult [name] again or page [name] at [phone].

## 2022-07-11 NOTE — TOC Initial Note (Signed)
Transition of Care [facility]) - Initial/Assessment Note    Patient Details  Name: Amanda Duarte MRN: [id number] Date of Birth: 28-Oct-1955  Transition of Care [facility]) CM/SW Contact:    Otelia Santee, LCSW Phone Number: 07/11/2022, 2:56 PM  Clinical Narrative:                 Met with pt and family in room. CSW discussed SDOH concerns for food and utility insecurity. Pt currently receives retirement income and has SNAP benefits. Pt is agreeable to referrals being made through [facility]. Referral for food and utility assistance have been sent.  TOC will continue to follow for discharge needs.   Expected Discharge Plan: Home/Self Care Barriers to Discharge: Continued Medical Work up   Patient Goals and CMS Choice Patient states their goals for this hospitalization and ongoing recovery are:: To return home CMS Medicare.gov Compare Post Acute Care list provided to:: Patient Choice offered to / list presented to : Patient [place] ownership interest in [facility].provided to::  (NA)    Expected Discharge Plan and Services In-house Referral: NA Discharge Planning Services: NA Post Acute Care Choice: NA Living arrangements for the past 2 months: Single Family Home                 DME Arranged: N/A DME Agency: NA                  Prior Living Arrangements/Services Living arrangements for the past 2 months: Single Family Home Lives with:: Adult Children Patient language and need for interpreter reviewed:: Yes (Spanish)        Need for Family Participation in Patient Care: No (Comment) Care giver support system in place?: No (comment) Current home services: DME Criminal Activity/Legal Involvement Pertinent to Current Situation/Hospitalization: No - Comment as needed  Activities of Daily Living Home Assistive Devices/Equipment: None ADL Screening (condition at time of admission) Patient's cognitive ability adequate to safely complete daily activities?: Yes Is the  patient deaf or have difficulty hearing?: No Does the patient have difficulty seeing, even when wearing glasses/contacts?: No Does the patient have difficulty concentrating, remembering, or making decisions?: No Patient able to express need for assistance with ADLs?: No Does the patient have difficulty dressing or bathing?: No Independently performs ADLs?: Yes (appropriate for developmental age) Does the patient have difficulty walking or climbing stairs?: No Weakness of Legs: None Weakness of Arms/Hands: None  Permission Sought/Granted Permission sought to share information with : Family Supports Permission granted to share information with : Yes, Verbal Permission Granted  Share Information with NAME: [name]     Permission granted to share info w Relationship: Daughter  Permission granted to share info w Contact Information: [phone]  Emotional Assessment Appearance:: Appears stated age Attitude/Demeanor/Rapport: Engaged Affect (typically observed): Accepting Orientation: : Oriented to Self, Oriented to Place, Oriented to  Time, Oriented to Situation Alcohol / Substance Use: Not Applicable Psych Involvement: No (comment)  Admission diagnosis:  Facial pain [R51.9] Cellulitis, face [L03.211] Periorbital cellulitis [L03.213] Pancytopenia (HCC) [D61.818] Neutropenia, unspecified type (HCC) [D70.9] Patient Active Problem List   Diagnosis Date Noted   Periorbital cellulitis 07/09/2022   Other pancytopenia (HCC) 06/23/2022   Neutropenic fever (HCC) 12/14/2021   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   Lobar pneumonia, unspecified organism (HCC) 12/07/2021   Malnutrition of moderate degree 12/07/2021   Aspiration pneumonia of left lower lobe due to gastric secretions (HCC) 12/07/2021   Acute respiratory failure with hypoxia (HCC) 12/06/2021  On mechanically assisted ventilation (HCC) 12/06/2021   Acute respiratory distress syndrome (ARDS) (HCC) 12/06/2021    Septic shock (HCC) 12/06/2021   Sepsis with acute organ dysfunction without septic shock (HCC) 12/02/2021   GERD (gastroesophageal reflux disease) 12/02/2021   MDS (myelodysplastic syndrome), high grade (HCC) 11/19/2021   Pancytopenia (HCC) 10/21/2021   Thrombocytopenia (HCC) 10/21/2021   Hyponatremia 10/21/2021   Hypokalemia 10/21/2021   Anxiety 10/21/2021   Depression 10/21/2021   Axillary mass 04/16/2021   Abdominal pain 11/20/2019   Vaginal atrophy 11/20/2019   Acute vulvitis 11/20/2019   Current moderate episode of major depressive disorder without prior episode (HCC) 02/08/2019   HLD (hyperlipidemia) 06/27/2018   Port-A-Cath in place 06/09/2017   Malignant neoplasm of upper-inner quadrant of left breast in female, 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   Closed fracture of left tibial plateau    Axillary lymphadenopathy 04/12/2017   Closed fracture of lateral portion of left tibial plateau 04/11/2017   Chest pain, neg MI, neg stress Nuc study, possible GI 05/19/2014   Essential hypertension 05/19/2014   Type 2 diabetes mellitus with hyperlipidemia (HCC) 05/19/2014   PCP:  Claiborne Rigg, NP Pharmacy:   [facility] [address], [place] - [address] [place] [address] Phone: [phone] Fax: [phone]     Social Determinants of Health (SDOH) Social History: SDOH Screenings   Food Insecurity: Food Insecurity Present (07/09/2022)  Housing: Medium Risk (07/09/2022)  Transportation Needs: No Transportation Needs (07/09/2022)  Utilities: At Risk (07/09/2022)  Alcohol Screen: Low Risk  (03/17/2022)  Depression (PHQ2-9): Low Risk  (05/16/2022)  Financial Resource Strain: Medium Risk (03/17/2022)  Physical Activity: Insufficiently Active (03/17/2022)  Social Connections: Moderately Isolated (03/17/2022)  Stress: No Stress Concern Present (03/17/2022)  Tobacco Use: Low Risk  (07/09/2022)   SDOH Interventions: Food  Insecurity Interventions: [id number] Referral, Inpatient TOC Housing Interventions: Inpatient TOC, Intervention Not Indicated Utilities Interventions: Inpatient TOC, [id number] Referral   Readmission Risk Interventions    07/11/2022    2:50 PM 12/02/2021    2:10 PM  Readmission Risk Prevention Plan  Transportation Screening Complete Complete  PCP or Specialist Appt within 5-7 Days  Complete  PCP or Specialist Appt within 3-5 Days Complete   Home Care Screening  Complete  Medication Review (RN CM)  Complete  HRI or Home Care Consult Complete   Social Work Consult for Recovery Care Planning/Counseling Complete   Palliative Care Screening Not Applicable   Medication Review Oceanographer) Complete

## 2022-07-11 NOTE — Progress Notes (Signed)
Mobility Specialist - Progress Note   07/11/22 1028  Mobility  Activity Ambulated with assistance in hallway  Level of Assistance Independent after set-up  Assistive Device Other (Comment) (IV Pole)  Distance Ambulated (ft) 500 ft  Activity Response Tolerated well  Mobility Referral Yes  $Mobility charge 1 Mobility  Mobility Specialist Start Time (ACUTE ONLY) 1021  Mobility Specialist Stop Time (ACUTE ONLY) 1027  Mobility Specialist Time Calculation (min) (ACUTE ONLY) 6 min   Pt received in bed and agreeable to mobility. No complaints during session. Pt to bed after session with all needs met.    [facility]

## 2022-07-11 NOTE — Inpatient Diabetes Management (Signed)
Inpatient Diabetes Program Recommendations  AACE/ADA: New Consensus Statement on Inpatient Glycemic Control (2015)  Target Ranges:  Prepandial:   less than 140 mg/dL      Peak postprandial:   less than 180 mg/dL (1-2 hours)      Critically ill patients:  140 - 180 mg/dL   Lab Results  Component Value Date   GLUCAP 183 (H) 07/11/2022   HGBA1C 11.9 (H) 07/10/2022    Review of Glycemic Control  Latest Reference Range & Units 07/10/22 07:29 07/10/22 12:26 07/10/22 17:14 07/10/22 20:29 07/11/22 08:42  Glucose-Capillary 70 - 99 mg/dL 161 (H) 096 (H) 045 (H) 219 (H) 183 (H)   Diabetes history: DM 2 Outpatient Diabetes medications:  FSL 2 Humalog 20 units tid with meals Glargine 20 units bid Current orders for Inpatient glycemic control:  Novolog 0-15 units tid with meals and HS Novolog 4 units tid with meals Levemir 20 units bid  Inpatient Diabetes Program Recommendations:    Agree with orders.  Insulin changes were made on 07/08/22 by outpatient pharmacist.  Will follow.   Thanks,  Beryl Meager, RN, BC-ADM Inpatient Diabetes Coordinator Pager [phone] (8a-5p)

## 2022-07-11 NOTE — Progress Notes (Signed)
Amanda Duarte   DOB:02-23-1955   GN#:[id number]   HQI#:[id number]   Heme-onc follow-up note  Subjective: Patient reports worsening right side edema around her eyes, she is not able to open her right eye fully.  Some discomfort, but no significant pain or other new complaints.   Objective:  Vitals:   07/11/22 1144 07/11/22 1333  BP: (!) 140/47 (!) 147/59  Pulse: 78 75  Resp: 16 18  Temp: 98.3 F (36.8 C) 98.4 F (36.9 C)  SpO2: 99% 99%    Body mass index is 23.69 kg/m.  Intake/Output Summary (Last 24 hours) at 07/11/2022 1744 Last data filed at 07/11/2022 1505 Gross per 24 hour  Intake 2406.65 ml  Output --  Net 2406.65 ml     Sclerae unicteric, (+) edema and skin erythema around the right eye  Oropharynx clear  No peripheral adenopathy  Lungs clear -- no rales or rhonchi  Heart regular rate and rhythm  Abdomen benign  MSK no focal spinal tenderness, no peripheral edema  Neuro nonfocal    CBG (last 3)  Recent Labs    07/11/22 0842 07/11/22 1155 07/11/22 1704  GLUCAP 183* 189* 161*     Labs:   Urine Studies No results for input(s): "UHGB", "CRYS" in the last 72 hours.  Invalid input(s): "UACOL", "UAPR", "USPG", "UPH", "UTP", "UGL", "UKET", "UBIL", "UNIT", "UROB", "ULEU", "UEPI", "UWBC", "URBC", "UBAC", "CAST", "UCOM", "BILUA"  Basic Metabolic Panel: Recent Labs  Lab 07/09/22 1259 07/09/22 1313 07/10/22 0555 07/11/22 0442 07/11/22 0810  NA 132*  --  133*  --  133*  K 4.2  --  3.4*  --  3.9  CL 97*  --  99  --  102  CO2 25  --  26  --  23  GLUCOSE 354*  --  304*  --  186*  BUN 14  --  11  --  11  CREATININE 0.60 0.50 0.55 0.44 0.41*  CALCIUM 8.8*  --  8.7*  --  8.8*   GFR Estimated Creatinine Clearance: 53.1 mL/min (A) (by C-G formula based on SCr of 0.41 mg/dL (L)). Liver Function Tests: No results for input(s): "AST", "ALT", "ALKPHOS", "BILITOT", "PROT", "ALBUMIN" in the last 168 hours. No results for input(s): "LIPASE", "AMYLASE" in the last 168  hours. No results for input(s): "AMMONIA" in the last 168 hours. Coagulation profile No results for input(s): "INR", "PROTIME" in the last 168 hours.  CBC: Recent Labs  Lab 07/07/22 1126 07/09/22 1259 07/10/22 0555 07/11/22 0810  WBC 1.0* 1.0* 1.1* 1.0*  NEUTROABS 0.1* 0.1* 0.1* 0.2*  HGB 8.8* 8.6* 8.2* 8.1*  HCT 25.3* 25.2* 23.8* 23.8*  MCV 85.5 86.9 86.5 88.1  PLT 14* 10* 19* 13*   Cardiac Enzymes: No results for input(s): "CKTOTAL", "CKMB", "CKMBINDEX", "TROPONINI" in the last 168 hours. BNP: Invalid input(s): "POCBNP" CBG: Recent Labs  Lab 07/10/22 1714 07/10/22 2029 07/11/22 0842 07/11/22 1155 07/11/22 1704  GLUCAP 204* 219* 183* 189* 161*   D-Dimer No results for input(s): "DDIMER" in the last 72 hours. Hgb A1c Recent Labs    07/10/22 0948  HGBA1C 11.9*   Lipid Profile No results for input(s): "CHOL", "HDL", "LDLCALC", "TRIG", "CHOLHDL", "LDLDIRECT" in the last 72 hours. Thyroid function studies No results for input(s): "TSH", "T4TOTAL", "T3FREE", "THYROIDAB" in the last 72 hours.  Invalid input(s): "FREET3" Anemia work up No results for input(s): "VITAMINB12", "FOLATE", "FERRITIN", "TIBC", "IRON", "RETICCTPCT" in the last 72 hours. Microbiology Recent Results (from the past 240 hour(s))  Culture, blood (routine x 2)     Status: None (Preliminary result)   Collection Time: 07/09/22  3:38 PM   Specimen: BLOOD  Result Value Ref Range Status   Specimen Description   Final    BLOOD SITE NOT SPECIFIED Performed at [facility], [address], [place], [place] [address]    Special Requests   Final    BOTTLES DRAWN AEROBIC AND ANAEROBIC Blood Culture adequate volume Performed at [facility], [address], [place], [place] [address]    Culture   Final    NO GROWTH 2 DAYS Performed at [facility], [address], [place], [place] [address]    Report Status PENDING  Incomplete  Culture, blood (routine x  2)     Status: None (Preliminary result)   Collection Time: 07/09/22  3:43 PM   Specimen: BLOOD  Result Value Ref Range Status   Specimen Description   Final    BLOOD LEFT ANTECUBITAL Performed at [facility], [address] [place], [place] [address]    Special Requests   Final    BOTTLES DRAWN AEROBIC AND ANAEROBIC Blood Culture adequate volume Performed at [facility], [address], [place], [place] [address]    Culture   Final    NO GROWTH 2 DAYS Performed at [facility], [address], [place], [place] [address]    Report Status PENDING  Incomplete      Studies:  No results found.  Assessment: 67 y.o. 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 -She is on broad antibiotics vancomycin and cefepime -Her cellulitis appears to be worse than 2 days ago.  If no improvement by tomorrow, may be consult ID, due to her severe compromised immune system, previous history of sepsis and ICU admission  -blood culture negative so far  -I do not recommend G-CSF due to her high-grade MDS -Consider blood transfusion for hemoglobin less than 8, and platelet less than 20.  If she has active bleeding, consider platelet transfusion with higher threshold.   Malachy Mood, MD 07/11/2022  5:44 PM

## 2022-07-12 ENCOUNTER — Encounter (HOSPITAL_COMMUNITY): Payer: Self-pay | Admitting: Internal Medicine

## 2022-07-12 ENCOUNTER — Ambulatory Visit: Payer: Medicare Other

## 2022-07-12 ENCOUNTER — Inpatient Hospital Stay (HOSPITAL_COMMUNITY): Payer: Medicare Other

## 2022-07-12 DIAGNOSIS — E1169 Type 2 diabetes mellitus with other specified complication: Secondary | ICD-10-CM

## 2022-07-12 DIAGNOSIS — Z794 Long term (current) use of insulin: Secondary | ICD-10-CM

## 2022-07-12 DIAGNOSIS — D709 Neutropenia, unspecified: Secondary | ICD-10-CM

## 2022-07-12 DIAGNOSIS — L03213 Periorbital cellulitis: Secondary | ICD-10-CM | POA: Diagnosis not present

## 2022-07-12 DIAGNOSIS — L03211 Cellulitis of face: Secondary | ICD-10-CM | POA: Diagnosis not present

## 2022-07-12 DIAGNOSIS — R519 Headache, unspecified: Secondary | ICD-10-CM | POA: Diagnosis not present

## 2022-07-12 DIAGNOSIS — D61818 Other pancytopenia: Secondary | ICD-10-CM | POA: Diagnosis not present

## 2022-07-12 DIAGNOSIS — D696 Thrombocytopenia, unspecified: Secondary | ICD-10-CM

## 2022-07-12 LAB — BASIC METABOLIC PANEL
Anion gap: 7 (ref 5–15)
BUN: 10 mg/dL (ref 8–23)
CO2: 25 mmol/L (ref 22–32)
Calcium: 8.7 mg/dL — ABNORMAL LOW (ref 8.9–10.3)
Chloride: 99 mmol/L (ref 98–111)
Creatinine, Ser: 0.45 mg/dL (ref 0.44–1.00)
GFR, Estimated: >60 mL/min (ref >60)
Glucose, Bld: 293 mg/dL — ABNORMAL HIGH (ref 70–99)
Potassium: 3.9 mmol/L (ref 3.5–5.1)
Sodium: 131 mmol/L — ABNORMAL LOW (ref 135–145)

## 2022-07-12 LAB — CBC WITH DIFFERENTIAL/PLATELET
Abs Immature Granulocytes: 0 10*3/uL (ref 0.00–0.07)
Basophils Absolute: 0 10*3/uL (ref 0.0–0.1)
Basophils Relative: 1 %
Eosinophils Absolute: 0 10*3/uL (ref 0.0–0.5)
Eosinophils Relative: 0 %
HCT: 21.8 % — ABNORMAL LOW (ref 36.0–46.0)
Hemoglobin: 7.5 g/dL — ABNORMAL LOW (ref 12.0–15.0)
Immature Granulocytes: 0 %
Lymphocytes Relative: 79 %
Lymphs Abs: 0.8 10*3/uL (ref 0.7–4.0)
MCH: 29.9 pg (ref 26.0–34.0)
MCHC: 34.4 g/dL (ref 30.0–36.0)
MCV: 86.9 fL (ref 80.0–100.0)
Monocytes Absolute: 0.1 10*3/uL (ref 0.1–1.0)
Monocytes Relative: 9 %
Neutro Abs: 0.1 10*3/uL — CL (ref 1.7–7.7)
Neutrophils Relative %: 11 %
Platelets: 29 10*3/uL — CL (ref 150–400)
RBC: 2.51 MIL/uL — ABNORMAL LOW (ref 3.87–5.11)
RDW: 13.9 % (ref 11.5–15.5)
WBC: 1 10*3/uL — CL (ref 4.0–10.5)
nRBC: 9.1 % — ABNORMAL HIGH (ref 0.0–0.2)

## 2022-07-12 LAB — PREPARE PLATELET PHERESIS

## 2022-07-12 LAB — GLUCOSE, CAPILLARY
Glucose-Capillary: 186 mg/dL — ABNORMAL HIGH (ref 70–99)
Glucose-Capillary: 195 mg/dL — ABNORMAL HIGH (ref 70–99)
Glucose-Capillary: 255 mg/dL — ABNORMAL HIGH (ref 70–99)
Glucose-Capillary: 133 mg/dL — ABNORMAL HIGH (ref 70–99)
Glucose-Capillary: 218 mg/dL — ABNORMAL HIGH (ref 70–99)

## 2022-07-12 LAB — PREPARE RBC (CROSSMATCH)

## 2022-07-12 LAB — BPAM RBC: Blood Product Expiration Date: 202407122359

## 2022-07-12 LAB — TYPE AND SCREEN: Unit division: 0

## 2022-07-12 LAB — CULTURE, BLOOD (ROUTINE X 2)

## 2022-07-12 LAB — BPAM PLATELET PHERESIS
ISSUE DATE / TIME: 202406171120
Unit Type and Rh: 5100

## 2022-07-12 MED ORDER — SODIUM CHLORIDE 0.9% IV SOLUTION: Freq: Once | INTRAVENOUS | Status: AC

## 2022-07-12 MED ORDER — IOHEXOL 300 MG/ML  SOLN
75.0000 mL | Freq: Once | INTRAMUSCULAR | Status: AC | PRN
  Administered 2022-07-12: 75 mL via INTRAVENOUS

## 2022-07-12 MED ORDER — OXYCODONE HCL 5 MG PO TABS
5.0000 mg | ORAL_TABLET | Freq: Once | ORAL | Status: AC
  Administered 2022-07-12: 5 mg via ORAL
  Filled 2022-07-12: qty 1

## 2022-07-12 MED ORDER — SODIUM CHLORIDE (PF) 0.9 % IJ SOLN
INTRAMUSCULAR | Status: AC
  Filled 2022-07-12: qty 50

## 2022-07-12 MED ORDER — PROCHLORPERAZINE EDISYLATE 10 MG/2ML IJ SOLN
5.0000 mg | Freq: Four times a day (QID) | INTRAMUSCULAR | Status: DC | PRN
  Administered 2022-07-12: 5 mg via INTRAVENOUS
  Filled 2022-07-12: qty 2

## 2022-07-12 MED ORDER — METRONIDAZOLE 500 MG PO TABS
500.0000 mg | ORAL_TABLET | Freq: Two times a day (BID) | ORAL | Status: DC
  Administered 2022-07-12 – 2022-07-13 (×3): 500 mg via ORAL
  Filled 2022-07-12 (×3): qty 1

## 2022-07-12 NOTE — Progress Notes (Signed)
[facility] PROGRESS NOTE    Progress Note  Amanda Duarte  ZOX:[id number] DOB: 01-14-1956 DOA: 07/09/2022 PCP: Claiborne Rigg, NP     Brief Narrative:   Amanda Duarte is an 67 y.o. female past medical history of essential hypertension insulin-dependent diabetes mellitus type 2, myelodysplastic syndrome on chemotherapy twice a week, thrombocytopenia neutropenia comes in with right facial swelling.  Overall swelling improved.  NO pain in eye or trouble/change with vision   Assessment/Plan:   Periorbital cellulitis in the setting of an immunocompromise patient with a history of MDS getting chemotherapy: Blood cultures were obtained in the ED. -CT scan: Right periorbital soft tissue swelling without discrete abscess. This is consistent with acute cellulitis. She was started on IV vancomycin and cefepime. -slow to improve-- will ask ID to see-- may need re-imaging and or opthalmology consult   Insulin-dependent uncontrolled diabetes mellitus type 2: With a last A1c in April of 10 Started on long-acting insulin plus sliding scale meal coverage.  Pancytopenia/MDS: Last transfusion was on the 13th and last chemotherapy on 07/01/2022. Oncology Dr. Mosetta Putt was consulted did not recommend Granix  Status post 1 unit of packed platelets  6/16 and again 6/17 -hemoglobin> 8-- will give I unit PRBC on 6/18  Essential hypertension Continue current home regimen.  Hypokalemia: Replete     DVT prophylaxis: lovenox   Family Communication: at bedside Status is: Inpatient Remains inpatient appropriate because: Right facial cellulitis    Code Status: full    IV Access:   Peripheral IV   Procedures and diagnostic studies:   No results found.   Medical Consultants:   oncology   Subjective:     Continues to deny vision changes or pain with eye movement   Objective:    Vitals:   07/11/22 1144 07/11/22 1333 07/11/22 2148 07/12/22 0541  BP: (!) 140/47 (!) 147/59 (!)  118/51 (!) 127/52  Pulse: 78 75 77 82  Resp: 16 18 18 18   Temp: 98.3 F (36.8 C) 98.4 F (36.9 C) 98.2 F (36.8 C) 98.5 F (36.9 C)  TempSrc: Oral Oral Oral Oral  SpO2: 99% 99% 98% 98%  Weight:      Height:       SpO2: 98 %   Intake/Output Summary (Last 24 hours) at 07/12/2022 1034 Last data filed at 07/12/2022 0100 Gross per 24 hour  Intake 486 ml  Output --  Net 486 ml   Filed Weights   07/09/22 1158 07/09/22 1735  Weight: 53.5 kg 55 kg    Exam:  General: Appearance:    Well developed, well nourished female in no acute distress   Swelling of right eye  Lungs:     respirations unlabored  Heart:    Normal heart rate. Normal rhythm. No murmurs, rubs, or gallops.   MS:   All extremities are intact.   Neurologic:   Awake, alert, oriented x 3. No apparent focal neurological           defect.       Data Reviewed:    Labs: Basic Metabolic Panel: Recent Labs  Lab 07/09/22 1259 07/09/22 1313 07/10/22 0555 07/11/22 0442 07/11/22 0810 07/12/22 0536  NA 132*  --  133*  --  133* 131*  K 4.2  --  3.4*  --  3.9 3.9  CL 97*  --  99  --  102 99  CO2 25  --  26  --  23 25  GLUCOSE 354*  --  304*  --  186* 293*  BUN 14  --  11  --  11 10  CREATININE 0.60 0.50 0.55 0.44 0.41* 0.45  CALCIUM 8.8*  --  8.7*  --  8.8* 8.7*   GFR Estimated Creatinine Clearance: 53.1 mL/min (by C-G formula based on SCr of 0.45 mg/dL). Liver Function Tests: No results for input(s): "AST", "ALT", "ALKPHOS", "BILITOT", "PROT", "ALBUMIN" in the last 168 hours. No results for input(s): "LIPASE", "AMYLASE" in the last 168 hours. No results for input(s): "AMMONIA" in the last 168 hours. Coagulation profile No results for input(s): "INR", "PROTIME" in the last 168 hours. COVID-19 Labs  No results for input(s): "DDIMER", "FERRITIN", "LDH", "CRP" in the last 72 hours.  Lab Results  Component Value Date   SARSCOV2NAA NEGATIVE 01/29/2022   SARSCOV2NAA NEGATIVE 12/01/2021    CBC: Recent Labs   Lab 07/07/22 1126 07/09/22 1259 07/10/22 0555 07/11/22 0810 07/12/22 0536  WBC 1.0* 1.0* 1.1* 1.0* 1.0*  NEUTROABS 0.1* 0.1* 0.1* 0.2* 0.1*  HGB 8.8* 8.6* 8.2* 8.1* 7.5*  HCT 25.3* 25.2* 23.8* 23.8* 21.8*  MCV 85.5 86.9 86.5 88.1 86.9  PLT 14* 10* 19* 13* 29*   Cardiac Enzymes: No results for input(s): "CKTOTAL", "CKMB", "CKMBINDEX", "TROPONINI" in the last 168 hours. BNP (last 3 results) No results for input(s): "PROBNP" in the last 8760 hours. CBG: Recent Labs  Lab 07/11/22 1155 07/11/22 1704 07/11/22 2148 07/12/22 0302 07/12/22 0734  GLUCAP 189* 161* 98 186* 195*   D-Dimer: No results for input(s): "DDIMER" in the last 72 hours. Hgb A1c: Recent Labs    07/10/22 0948  HGBA1C 11.9*   Lipid Profile: No results for input(s): "CHOL", "HDL", "LDLCALC", "TRIG", "CHOLHDL", "LDLDIRECT" in the last 72 hours. Thyroid function studies: No results for input(s): "TSH", "T4TOTAL", "T3FREE", "THYROIDAB" in the last 72 hours.  Invalid input(s): "FREET3" Anemia work up: No results for input(s): "VITAMINB12", "FOLATE", "FERRITIN", "TIBC", "IRON", "RETICCTPCT" in the last 72 hours. Sepsis Labs: Recent Labs  Lab 07/09/22 1259 07/10/22 0555 07/11/22 0810 07/12/22 0536  WBC 1.0* 1.1* 1.0* 1.0*   Microbiology Recent Results (from the past 240 hour(s))  Culture, blood (routine x 2)     Status: None (Preliminary result)   Collection Time: 07/09/22  3:38 PM   Specimen: BLOOD  Result Value Ref Range Status   Specimen Description   Final    BLOOD SITE NOT SPECIFIED Performed at [facility], [address] [place], [place] [address]    Special Requests   Final    BOTTLES DRAWN AEROBIC AND ANAEROBIC Blood Culture adequate volume Performed at [facility], [address], [place], [place] [address]    Culture   Final    NO GROWTH 3 DAYS Performed at [facility], [address] [place], [place] [address]    Report Status  PENDING  Incomplete  Culture, blood (routine x 2)     Status: None (Preliminary result)   Collection Time: 07/09/22  3:43 PM   Specimen: BLOOD  Result Value Ref Range Status   Specimen Description   Final    BLOOD LEFT ANTECUBITAL Performed at [facility], [address] [place], [place] [address]    Special Requests   Final    BOTTLES DRAWN AEROBIC AND ANAEROBIC Blood Culture adequate volume Performed at [facility], [address] [place], [place] [address]    Culture   Final    NO GROWTH 3 DAYS Performed at [facility], [address] [place], [place] [address]    Report Status PENDING  Incomplete     Medications:    amLODipine  5 mg Oral Daily   atorvastatin  40 mg Oral Daily   fenofibrate  160 mg Oral Daily   insulin aspart  0-15 Units Subcutaneous TID WC   insulin aspart  0-5 Units Subcutaneous QHS   insulin aspart  4 Units Subcutaneous TID WC   insulin detemir  20 Units Subcutaneous BID   pantoprazole  40 mg Oral Q0600   Continuous Infusions:  ceFEPime (MAXIPIME) IV 2 g (07/12/22 0414)   vancomycin        LOS: 3 days   Joseph Art  [facility]  07/12/2022, 10:34 AM

## 2022-07-12 NOTE — Plan of Care (Signed)
Problem: Education: Goal: Ability to describe self-care measures that may prevent or decrease complications (Diabetes Survival Skills Education) will improve 07/12/2022 0110 by Ronnald Collum, RN Outcome: Progressing 07/12/2022 0110 by Ronnald Collum, RN Outcome: Progressing Goal: Individualized Educational Video(s) 07/12/2022 0110 by Ronnald Collum, RN Outcome: Progressing 07/12/2022 0110 by Ronnald Collum, RN Outcome: Progressing   Problem: Coping: Goal: Ability to adjust to condition or change in health will improve 07/12/2022 0110 by Ronnald Collum, RN Outcome: Progressing 07/12/2022 0110 by Ronnald Collum, RN Outcome: Progressing   Problem: Fluid Volume: Goal: Ability to maintain a balanced intake and output will improve 07/12/2022 0110 by Ronnald Collum, RN Outcome: Progressing 07/12/2022 0110 by Ronnald Collum, RN Outcome: Progressing   Problem: Health Behavior/Discharge Planning: Goal: Ability to identify and utilize available resources and services will improve 07/12/2022 0110 by Ronnald Collum, RN Outcome: Progressing 07/12/2022 0110 by Ronnald Collum, RN Outcome: Progressing Goal: Ability to manage health-related needs will improve 07/12/2022 0110 by Ronnald Collum, RN Outcome: Progressing 07/12/2022 0110 by Ronnald Collum, RN Outcome: Progressing   Problem: Metabolic: Goal: Ability to maintain appropriate glucose levels will improve 07/12/2022 0110 by Ronnald Collum, RN Outcome: Progressing 07/12/2022 0110 by Ronnald Collum, RN Outcome: Progressing   Problem: Nutritional: Goal: Maintenance of adequate nutrition will improve 07/12/2022 0110 by Ronnald Collum, RN Outcome: Progressing 07/12/2022 0110 by Ronnald Collum, RN Outcome: Progressing Goal: Progress toward achieving an optimal weight will improve 07/12/2022 0110 by Ronnald Collum, RN Outcome: Progressing 07/12/2022 0110 by Ronnald Collum, RN Outcome: Progressing   Problem: Skin  Integrity: Goal: Risk for impaired skin integrity will decrease 07/12/2022 0110 by Ronnald Collum, RN Outcome: Progressing 07/12/2022 0110 by Ronnald Collum, RN Outcome: Progressing   Problem: Tissue Perfusion: Goal: Adequacy of tissue perfusion will improve 07/12/2022 0110 by Ronnald Collum, RN Outcome: Progressing 07/12/2022 0110 by Ronnald Collum, RN Outcome: Progressing   Problem: Education: Goal: Knowledge of General Education information will improve Description: Including pain rating scale, medication(s)/side effects and non-pharmacologic comfort measures 07/12/2022 0110 by Ronnald Collum, RN Outcome: Progressing 07/12/2022 0110 by Ronnald Collum, RN Outcome: Progressing   Problem: Health Behavior/Discharge Planning: Goal: Ability to manage health-related needs will improve 07/12/2022 0110 by Ronnald Collum, RN Outcome: Progressing 07/12/2022 0110 by Ronnald Collum, RN Outcome: Progressing   Problem: Clinical Measurements: Goal: Ability to maintain clinical measurements within normal limits will improve 07/12/2022 0110 by Ronnald Collum, RN Outcome: Progressing 07/12/2022 0110 by Ronnald Collum, RN Outcome: Progressing Goal: Will remain free from infection 07/12/2022 0110 by Ronnald Collum, RN Outcome: Progressing 07/12/2022 0110 by Ronnald Collum, RN Outcome: Progressing Goal: Diagnostic test results will improve 07/12/2022 0110 by Ronnald Collum, RN Outcome: Progressing 07/12/2022 0110 by Ronnald Collum, RN Outcome: Progressing Goal: Respiratory complications will improve 07/12/2022 0110 by Ronnald Collum, RN Outcome: Progressing 07/12/2022 0110 by Ronnald Collum, RN Outcome: Progressing Goal: Cardiovascular complication will be avoided 07/12/2022 0110 by Ronnald Collum, RN Outcome: Progressing 07/12/2022 0110 by Ronnald Collum, RN Outcome: Progressing   Problem: Activity: Goal: Risk for activity intolerance will decrease 07/12/2022 0110 by Ronnald Collum, RN Outcome: Progressing 07/12/2022 0110 by Ronnald Collum, RN Outcome: Progressing   Problem: Nutrition: Goal: Adequate nutrition will be maintained 07/12/2022 0110 by Ronnald Collum, RN Outcome: Progressing 07/12/2022 0110 by Ronnald Collum, RN Outcome: Progressing  Problem: Coping: Goal: Level of anxiety will decrease 07/12/2022 0110 by Ronnald Collum, RN Outcome: Progressing 07/12/2022 0110 by Ronnald Collum, RN Outcome: Progressing   Problem: Elimination: Goal: Will not experience complications related to bowel motility Outcome: Progressing Goal: Will not experience complications related to urinary retention Outcome: Progressing   Problem: Pain Managment: Goal: General experience of comfort will improve Outcome: Progressing   Problem: Safety: Goal: Ability to remain free from injury will improve Outcome: Progressing   Problem: Skin Integrity: Goal: Risk for impaired skin integrity will decrease Outcome: Progressing

## 2022-07-12 NOTE — Plan of Care (Signed)

## 2022-07-12 NOTE — Consult Note (Addendum)
[facility]                                                                                        Patient Identification: Patient Name: Amanda Duarte MRN: [id number] Admit Date: 07/09/2022 11:54 AM Today's Date: 07/12/2022 Reason for consult: periorbital cellulitis  Requesting provider: Hulan Saas   Principal Problem:   Periorbital cellulitis Active Problems:   Essential hypertension   Type 2 diabetes mellitus with hyperlipidemia (HCC)   Pancytopenia (HCC)   Thrombocytopenia (HCC)   MDS (myelodysplastic syndrome), high grade (HCC)   Antibiotics:  Cefepime 6/15-c Vancomycin 6/15-c Metronidazole 6/18-c  Lines/Hardware: left tibial plateau # s/p ORIF  Assessment 67 year old female with prior history of HTN, HLD, IDDM, High grade MDS status post chemotherapy with azacitidine, last 6/7, chronic pancytopenia ( non compliant to PO levaquin ppx)who presented to the ED on 6/15 with right eye/facial pain and swelling, frontal headache, gum pain.   # Periorbital cellulitis  She does not have symptoms or orbital cellulitis so far ( no visual complaints, EOMI intact) but at risk given DM and severe neutropenia/immunosuppression  Denies any insect bite, known trauma but h/o intermittent pain/swelling at rt upper gum in the setting of teeth removal few years ago  Daughter reports no improvement on day 4 of abtx   # Chronic pancytopenia/severe thrombocytopenia/severe neutropenia  - in the setting of high grade MDS on azacitidine, last dose 6/7 - Not compliant to levofloxacin ppx - Oncology following, did not recommend granix.   # IDDM, uncontrolled  - A1c 11.9 - Risk factor for # 1  Recommendations  Continue Vancomycin, cefepime, will add metronidazole for ? Odontogenic source CT orbits ordered  Ophthalmology consult, d/w primary  Fu blood cultures to completion  Needs optimal BG control   Following   Rest of the management as per the primary team. Please call with questions or concerns.  Thank you for the consult  __________________________________________________________________________________________________________ HPI and Hospital Course: 67 year old female with prior history of HTN, HLD, IDDM, Left breast ca s/p left sided MRM in remission, High grade MDS status post chemotherapy with azacitidine, last 6/7, chronic pancytopenia ( non compliant to PO levaquin ppx)who presented to the ED on 6/15 with right eye/facial pain and swelling, frontal headache, gum pain. Spoke with patient with the help of her daughter who is assisting with interpretation.  Reports she had teeth pulled out few years ago and she was recently seen at the dental clinic 2 months ago when had an x-ray done and nothing was done.  Daughter however reports that she has intermittent erythema at the right side of her upper gum with intermittent pain but she does not follow-up with a dentist regularly.  Denies any gum discharge or difficulty with swallowing.  She had itching followed by swelling in her right periorbital area last Wednesday/Thursday which progressed to right cheek.  She also had fever a week ago presentation but denies having chills or sweats.  Denies her having any nausea vomiting or diarrhea.  Denies any cough chest pain or shortness of breath.  Denies any GU symptoms.  Denies any rashes or  joint.  He has mild headache in the frontal side which seems to have gotten better.  She denies having any visual complaints or blurry vision or ear pain or pain with moving her eyes.   At ED febrile Labs remarkable for WBC 1 ANC 100, hemoglobin 8.6, platelets 10, NA 132 creatinine 0.6, blood glucose 354 Exam at the ED tenderness at the right maxillary area, EOMI intact, mild swelling of the right upper gingiva S/p broad  spectrum antibiotics Imaging as below  ROS: General- Denies chills, loss of appetite and  loss of weight HEENT - Denies  blurry vision, neck pain, sinus pain, mild frontal headache  Chest - Denies any chest pain, SOB or cough CVS- Denies any dizziness/lightheadedness, syncopal attacks, palpitations Abdomen- Denies any nausea, vomiting, abdominal pain, hematochezia and diarrhea Neuro - Denies any weakness, numbness, tingling sensation Psych - Denies any changes in mood irritability or depressive symptoms GU- Denies any burning, dysuria, hematuria or increased frequency of urination Skin - denies any rashes/lesions MSK - denies any joint pain/swelling or restricted ROM   Past Medical History:  Diagnosis Date   Cancer (HCC) 03/2017   left breast cancer   Diabetes mellitus without complication (HCC)    Hyperlipidemia    Hypertension    Malignant neoplasm of left breast (HCC)    Stroke (cerebrum) (HCC)    Tibial plateau fracture, left    04-13-17 had ORIF   Past Surgical History:  Procedure Laterality Date   MASTECTOMY Left 2019   MASTECTOMY MODIFIED RADICAL Left 11/20/2017   Procedure: LEFT MODIFIED RADICAL MASTECTOMY;  Surgeon: Claud Kelp, MD;  Location: [facility] OR;  Service: General;  Laterality: Left;   ORIF TIBIA PLATEAU Left 04/13/2017   Procedure: OPEN REDUCTION INTERNAL FIXATION (ORIF) TIBIAL PLATEAU;  Surgeon: Roby Lofts, MD;  Location: [facility] OR;  Service: Orthopedics;  Laterality: Left;   PORT-A-CATH REMOVAL Right 11/20/2017   Procedure: REMOVAL PORT-A-CATH;  Surgeon: Claud Kelp, MD;  Location: [facility] OR;  Service: General;  Laterality: Right;   PORTACATH PLACEMENT Right 05/31/2017   Procedure: INSERTION PORT-A-CATH;  Surgeon: Claud Kelp, MD;  Location:  SURGERY CENTER;  Service: General;  Laterality: Right;     Scheduled Meds:  amLODipine  5 mg Oral Daily   atorvastatin  40 mg Oral Daily   fenofibrate  160 mg Oral Daily   insulin aspart  0-15 Units Subcutaneous TID WC   insulin aspart  0-5 Units Subcutaneous QHS   insulin aspart  4 Units  Subcutaneous TID WC   insulin detemir  20 Units Subcutaneous BID   pantoprazole  40 mg Oral Q0600   Continuous Infusions:  ceFEPime (MAXIPIME) IV 2 g (07/12/22 0414)   vancomycin     PRN Meds:.acetaminophen, ondansetron, polyvinyl alcohol, prochlorperazine, traMADol  No Known Allergies  Social History   Socioeconomic History   Marital status: Married    Spouse name: Not on file   Number of children: Not on file   Years of education: Not on file   Highest education level: Not on file  Occupational History   Not on file  Tobacco Use   Smoking status: Never   Smokeless tobacco: Never  Vaping Use   Vaping Use: Never used  Substance and Sexual Activity   Alcohol use: No   Drug use: No   Sexual activity: Not Currently    Birth control/protection: None  Other Topics Concern   Not on file  Social History Narrative   Not on file  Social Determinants of Health   Financial Resource Strain: Medium Risk (03/17/2022)   Overall Financial Resource Strain (CARDIA)    Difficulty of Paying Living Expenses: Somewhat hard  Food Insecurity: Food Insecurity Present (07/09/2022)   Hunger Vital Sign    Worried About Running Out of Food in the Last Year: Often true    Ran Out of Food in the Last Year: Sometimes true  Transportation Needs: No Transportation Needs (07/09/2022)   PRAPARE - Administrator, Civil Service (Medical): No    Lack of Transportation (Non-Medical): No  Physical Activity: Insufficiently Active (03/17/2022)   Exercise Vital Sign    Days of Exercise per Week: 4 days    Minutes of Exercise per Session: 10 min  Stress: No Stress Concern Present (03/17/2022)   [employer] of Occupational Health - Occupational Stress Questionnaire    Feeling of Stress : Only a little  Social Connections: Moderately Isolated (03/17/2022)   Social Connection and Isolation Panel [NHANES]    Frequency of Communication with Friends and Family: More than three times a week     Frequency of Social Gatherings with Friends and Family: Three times a week    Attends Religious Services: More than 4 times per year    Active Member of Clubs or Organizations: No    Attends Banker Meetings: Never    Marital Status: Divorced  Catering manager Violence: Not At Risk (07/09/2022)   Humiliation, Afraid, Rape, and Kick questionnaire    Fear of Current or Ex-Partner: No    Emotionally Abused: No    Physically Abused: No    Sexually Abused: No   Family History  Problem Relation Age of Onset   Heart Problems Mother    Diabetes Son    Breast cancer Neg Hx    Colon cancer Neg Hx    Rectal cancer Neg Hx    Stomach cancer Neg Hx    Esophageal cancer Neg Hx    Vitals BP (!) 127/52 (BP Location: Right Arm)   Pulse 82   Temp 98.5 F (36.9 C) (Oral)   Resp 18   Ht 5' (1.524 m)   Wt 55 kg   SpO2 98%   BMI 23.69 kg/m    Physical Exam Constitutional: Elderly female sitting in the bed, not in acute distress    Comments: Right eye periorbital swelling, no purulent discharge, extraocular movements intact, mild swelling at the right cheek, no crepitus or fluctuance.  Mild swelling at the right upper gingival area but no discharge  Cardiovascular:     Rate and Rhythm: Normal rate and regular rhythm.     Heart sounds: s1s2  Pulmonary:     Effort: Pulmonary effort is normal.     Comments: Normal breath sounds   Abdominal:     Palpations: Abdomen is soft.     Tenderness: non distended and nontender  Musculoskeletal:        General: No swelling or tenderness in peripheral joints   Skin:    Comments: No rashes   Neurological:     General: awake, alert and oriented, following commands   Psychiatric:        Mood and Affect: Mood normal.    Pertinent Microbiology Results for orders placed or performed during the hospital encounter of 07/09/22  Culture, blood (routine x 2)     Status: None (Preliminary result)   Collection Time: 07/09/22  3:38 PM    Specimen: BLOOD  Result Value Ref Range  Status   Specimen Description   Final    BLOOD SITE NOT SPECIFIED Performed at [facility], [address], [place], [place] [address]    Special Requests   Final    BOTTLES DRAWN AEROBIC AND ANAEROBIC Blood Culture adequate volume Performed at [facility], [address], [place], [place] [address]    Culture   Final    NO GROWTH 3 DAYS Performed at [facility], [address], [place], [place] [address]    Report Status PENDING  Incomplete  Culture, blood (routine x 2)     Status: None (Preliminary result)   Collection Time: 07/09/22  3:43 PM   Specimen: BLOOD  Result Value Ref Range Status   Specimen Description   Final    BLOOD LEFT ANTECUBITAL Performed at [facility], [address] [place], [place] [address]    Special Requests   Final    BOTTLES DRAWN AEROBIC AND ANAEROBIC Blood Culture adequate volume Performed at [facility], [address], [place], [place] [address]    Culture   Final    NO GROWTH 3 DAYS Performed at [facility], [address], [place], [place] [address]    Report Status PENDING  Incomplete    Pertinent Lab seen by me:    Latest Ref Rng & Units 07/12/2022    5:36 AM 07/11/2022    8:10 AM 07/10/2022    5:55 AM  CBC  WBC 4.0 - 10.5 K/uL 1.0  1.0  1.1   Hemoglobin 12.0 - 15.0 g/dL 7.5  8.1  8.2   Hematocrit 36.0 - 46.0 % 21.8  23.8  23.8   Platelets 150 - 400 K/uL 29  13  19        Latest Ref Rng & Units 07/12/2022    5:36 AM 07/11/2022    8:10 AM 07/11/2022    4:42 AM  CMP  Glucose 70 - 99 mg/dL 578  469    BUN 8 - 23 mg/dL 10  11    Creatinine 6.29 - 1.00 mg/dL 5.28  4.13  2.44   Sodium 135 - 145 mmol/L 131  133    Potassium 3.5 - 5.1 mmol/L 3.9  3.9    Chloride 98 - 111 mmol/L 99  102    CO2 22 - 32 mmol/L 25  23    Calcium 8.9 - 10.3 mg/dL 8.7  8.8       Pertinent Imagings/Other Imagings Plain  films and CT images have been personally visualized and interpreted; radiology reports have been reviewed. Decision making incorporated into the Impression / Recommendations.  CT Maxillofacial W Contrast  Result Date: 07/09/2022 CLINICAL DATA:  Swelling about the right eye. Headache. Personal history of breast cancer. EXAM: CT MAXILLOFACIAL WITH CONTRAST TECHNIQUE: Multidetector CT imaging of the maxillofacial structures was performed with intravenous contrast. Multiplanar CT image reconstructions were also generated. RADIATION DOSE REDUCTION: This exam was performed according to the departmental dose-optimization program which includes automated exposure control, adjustment of the mA and/or kV according to patient size and/or use of iterative reconstruction technique. CONTRAST:  75mL OMNIPAQUE IOHEXOL 300 MG/ML  SOLN COMPARISON:  CT head without contrast 04/11/2017 FINDINGS: Osseous: No acute or focal osseous abnormality is present. Orbits: Right periorbital soft tissue swelling is present. Findings are most prominent inferiorly and medially. No discrete abscess is present. The underlying globe is within normal limits. The lateral gland is unremarkable. Left globe and  orbit are normal. Sinuses: Chronic wall thickening is present in the maxillary sinuses bilaterally. No acute disease is present. The paranasal sinuses and mastoid air cells are otherwise clear. Soft tissues: Soft tissues of the face and visualize neck are otherwise within normal limits. Limited intracranial: Within normal limits IMPRESSION: 1. Right periorbital soft tissue swelling without discrete abscess. This is consistent with acute cellulitis. 2. Chronic wall thickening in the maxillary sinuses bilaterally without acute disease. Electronically Signed   By: Marin Roberts M.D.   On: 07/09/2022 15:10     I have personally spent 96 minutes involved in face-to-face and non-face-to-face activities for this patient on the day of the visit.  Professional time spent includes the following activities: Preparing to see the patient (review of tests), Obtaining and/or reviewing separately obtained history (admission/discharge record), Performing a medically appropriate examination and/or evaluation , Ordering medications/tests/procedures, referring and communicating with other health care professionals, Documenting clinical information in the EMR, Independently interpreting results (not separately reported), Communicating results to the patient/family/caregiver, Counseling and educating the patient/family/caregiver and Care coordination (not separately reported).  Electronically signed by:   Plan d/w requesting provider as well as ID pharm D  Note: This document was prepared using dragon voice recognition software and may include unintentional dictation errors.   Odette Fraction, MD Infectious Disease Physician [facility] Pager: [phone]

## 2022-07-13 ENCOUNTER — Inpatient Hospital Stay (HOSPITAL_COMMUNITY): Payer: Medicare Other

## 2022-07-13 ENCOUNTER — Ambulatory Visit: Payer: Medicare Other

## 2022-07-13 DIAGNOSIS — H05011 Cellulitis of right orbit: Secondary | ICD-10-CM

## 2022-07-13 DIAGNOSIS — D709 Neutropenia, unspecified: Secondary | ICD-10-CM | POA: Diagnosis not present

## 2022-07-13 DIAGNOSIS — L03213 Periorbital cellulitis: Secondary | ICD-10-CM | POA: Diagnosis not present

## 2022-07-13 DIAGNOSIS — D61818 Other pancytopenia: Secondary | ICD-10-CM | POA: Diagnosis not present

## 2022-07-13 DIAGNOSIS — E1169 Type 2 diabetes mellitus with other specified complication: Secondary | ICD-10-CM | POA: Diagnosis not present

## 2022-07-13 LAB — CBC WITH DIFFERENTIAL/PLATELET
Abs Immature Granulocytes: 0.02 10*3/uL (ref 0.00–0.07)
Basophils Absolute: 0 10*3/uL (ref 0.0–0.1)
Basophils Relative: 1 %
Eosinophils Absolute: 0 10*3/uL (ref 0.0–0.5)
Eosinophils Relative: 0 %
HCT: 27.1 % — ABNORMAL LOW (ref 36.0–46.0)
Hemoglobin: 9.3 g/dL — ABNORMAL LOW (ref 12.0–15.0)
Immature Granulocytes: 2 %
Lymphocytes Relative: 81 %
Lymphs Abs: 1.1 10*3/uL (ref 0.7–4.0)
MCH: 29.2 pg (ref 26.0–34.0)
MCHC: 34.3 g/dL (ref 30.0–36.0)
MCV: 85 fL (ref 80.0–100.0)
Monocytes Absolute: 0.1 10*3/uL (ref 0.1–1.0)
Monocytes Relative: 8 %
Neutro Abs: 0.1 10*3/uL — CL (ref 1.7–7.7)
Neutrophils Relative %: 8 %
Platelets: 21 10*3/uL — CL (ref 150–400)
RBC: 3.19 MIL/uL — ABNORMAL LOW (ref 3.87–5.11)
RDW: 14 % (ref 11.5–15.5)
WBC: 1.3 10*3/uL — CL (ref 4.0–10.5)
nRBC: 7.5 % — ABNORMAL HIGH (ref 0.0–0.2)

## 2022-07-13 LAB — BASIC METABOLIC PANEL
Anion gap: 8 (ref 5–15)
BUN: 10 mg/dL (ref 8–23)
CO2: 27 mmol/L (ref 22–32)
Calcium: 8.7 mg/dL — ABNORMAL LOW (ref 8.9–10.3)
Chloride: 97 mmol/L — ABNORMAL LOW (ref 98–111)
Creatinine, Ser: 0.53 mg/dL (ref 0.44–1.00)
GFR, Estimated: >60 mL/min (ref >60)
Glucose, Bld: 127 mg/dL — ABNORMAL HIGH (ref 70–99)
Potassium: 3.7 mmol/L (ref 3.5–5.1)
Sodium: 132 mmol/L — ABNORMAL LOW (ref 135–145)

## 2022-07-13 LAB — GLUCOSE, CAPILLARY
Glucose-Capillary: 125 mg/dL — ABNORMAL HIGH (ref 70–99)
Glucose-Capillary: 147 mg/dL — ABNORMAL HIGH (ref 70–99)
Glucose-Capillary: 190 mg/dL — ABNORMAL HIGH (ref 70–99)
Glucose-Capillary: 162 mg/dL — ABNORMAL HIGH (ref 70–99)

## 2022-07-13 LAB — TYPE AND SCREEN: Antibody Screen: NEGATIVE

## 2022-07-13 LAB — BPAM RBC
ISSUE DATE / TIME: 202406181549
Unit Type and Rh: 5100

## 2022-07-13 MED ORDER — VANCOMYCIN HCL 750 MG/150ML IV SOLN: 750.0000 mg | INTRAVENOUS | Status: DC

## 2022-07-13 MED ORDER — NAPHAZOLINE-GLYCERIN 0.012-0.25 % OP SOLN
1.0000 [drp] | Freq: Four times a day (QID) | OPHTHALMIC | Status: DC | PRN
  Filled 2022-07-13: qty 15

## 2022-07-13 MED ORDER — METRONIDAZOLE 500 MG PO TABS: 500.0000 mg | ORAL_TABLET | Freq: Two times a day (BID) | ORAL | Status: DC

## 2022-07-13 MED ORDER — IOHEXOL 300 MG/ML  SOLN
75.0000 mL | Freq: Once | INTRAMUSCULAR | Status: AC | PRN
  Administered 2022-07-13: 75 mL via INTRAVENOUS

## 2022-07-13 MED ORDER — SODIUM CHLORIDE 0.9 % IV SOLN: 2.0000 g | Freq: Two times a day (BID) | INTRAVENOUS | Status: DC

## 2022-07-13 MED ORDER — OXYCODONE HCL 5 MG PO TABS: 5.0000 mg | ORAL_TABLET | ORAL | Status: DC | PRN

## 2022-07-13 NOTE — Progress Notes (Signed)
[facility] Infectious Diseases Follow Up Note  Patient Identification: Patient Name: Amanda Duarte MRN: [id number] Admit Date: 07/09/2022 11:54 AM Age: 67 y.o.Today's Date: 07/13/2022  Reason for Visit: Orbital and periorbital cellulitis   Principal Problem:   Periorbital cellulitis Active Problems:   Essential hypertension   Type 2 diabetes mellitus with hyperlipidemia (HCC)   Pancytopenia (HCC)   Thrombocytopenia (HCC)   MDS (myelodysplastic syndrome), high grade (HCC)   Neutropenia (HCC)   Antibiotics: Orbital and periorbital cellulitis  Antibiotics:  Cefepime 6/15-c Vancomycin 6/15-c Metronidazole 6/18-c   Lines/Hardware: left tibial plateau # s/p ORIF  Interval Events: Continues to be afebrile, CT orbit suggestive of orbital and periorbital cellulitis.  Ophthalmology has been consulted and possible plan to transfer to tertiary facility   Assessment 67 year old female with prior history of HTN, HLD, IDDM, High grade MDS status post chemotherapy with azacitidine, last 6/7, chronic pancytopenia ( non compliant to PO levaquin ppx)who presented to the ED on 6/15 with right eye/facial pain and swelling, frontal headache, gum pain.    # Orbital and Periorbital cellulitis  Denies any insect bite, known trauma but h/o intermittent pain/swelling at rt upper gum in the setting of teeth removal few years ago  CT orbit 6/18 Right orbital and periorbital cellulitis with mild inflammatory change of the medial extraconal fat and marked right periorbital edema. Right ostiomeatal complex sinusitis is a possible source.  Opthalmology Dr Allena Katz recommended transfer to tertiary facility  ENT not consulted as patient accepted at [facility] and they will call their ENT per primary team  She is at risk of invasive fungal infections like mucormycosis, aspergillus given her uncontrolled DM and severe neutropenia. She is not DKA   # Chronic  pancytopenia/severe thrombocytopenia/severe neutropenia  - in the setting of high grade MDS on azacitidine, last dose 6/7 - Not compliant to levofloxacin ppx - Oncology following, did not recommend granix.    # IDDM, uncontrolled  - A1c 11.9 - Risk factor for # 1   Recommendations Continue current antibiotics, at risk for invasive fungal infections like mucormycosis and aspergillus as stated above  She has some rt sided headache, possibly radiating from her rt eye cellulitis. Will get however CT head stat to r/o intracranial extension.  Patient has been accepted at [facility] for higher level of care for Ophthalmology and ENT eval.   Rest of the management as per the primary team. Thank you for the consult. Please page with pertinent questions or concerns.  ______________________________________________________________________ Subjective patient seen and examined at the bedside. Feels eye swelling same to mildly improved and some radiating headache from the frontal/rt side of head. Denies blurry vision, ear pain, neck pain   Vitals BP (!) 122/50 (BP Location: Right Arm)   Pulse 84   Temp 99.7 F (37.6 C) (Oral)   Resp 16   Ht 5' (1.524 m)   Wt 55 kg   SpO2 96%   BMI 23.69 kg/m     Physical Exam Constitutional: Adult female sitting on the bed     Comments: rt orbital swelling with tearing of the eyes, no discharge, EOMI intact  Cardiovascular:     Rate and Rhythm: Normal rate and regular rhythm.     Heart sounds:   Pulmonary:     Effort: Pulmonary effort is normal.     Comments:   Abdominal:     Palpations: Abdomen is soft.     Tenderness: Nondistended  Musculoskeletal:        General: No  swelling or tenderness in peripheral joints   Skin:    Comments: no rashes   Neurological:     General: awake, alert and oriented, following commands   Psychiatric:        Mood and Affect: Mood normal.   Pertinent Microbiology Results for orders placed or performed during the  hospital encounter of 07/09/22  Culture, blood (routine x 2)     Status: None (Preliminary result)   Collection Time: 07/09/22  3:38 PM   Specimen: BLOOD  Result Value Ref Range Status   Specimen Description   Final    BLOOD SITE NOT SPECIFIED Performed at [facility], [address], [place], [place] [address]    Special Requests   Final    BOTTLES DRAWN AEROBIC AND ANAEROBIC Blood Culture adequate volume Performed at [facility], [address] [place], [place] [address]    Culture   Final    NO GROWTH 4 DAYS Performed at [facility], [address], [place], [place] [address]    Report Status PENDING  Incomplete  Culture, blood (routine x 2)     Status: None (Preliminary result)   Collection Time: 07/09/22  3:43 PM   Specimen: BLOOD  Result Value Ref Range Status   Specimen Description   Final    BLOOD LEFT ANTECUBITAL Performed at [facility], [address], [place], [place] [address]    Special Requests   Final    BOTTLES DRAWN AEROBIC AND ANAEROBIC Blood Culture adequate volume Performed at [facility], [address] [place], [place] [address]    Culture   Final    NO GROWTH 4 DAYS Performed at [facility], [address] [place], [place] [address]    Report Status PENDING  Incomplete    Pertinent Lab.    Latest Ref Rng & Units 07/13/2022    5:38 AM 07/12/2022    5:36 AM 07/11/2022    8:10 AM  CBC  WBC 4.0 - 10.5 K/uL 1.3  1.0  1.0   Hemoglobin 12.0 - 15.0 g/dL 9.3  7.5  8.1   Hematocrit 36.0 - 46.0 % 27.1  21.8  23.8   Platelets 150 - 400 K/uL 21  29  13        Latest Ref Rng & Units 07/13/2022    5:38 AM 07/12/2022    5:36 AM 07/11/2022    8:10 AM  CMP  Glucose 70 - 99 mg/dL 259  563  875   BUN 8 - 23 mg/dL 10  10  11    Creatinine 0.44 - 1.00 mg/dL 6.43  3.29  5.18   Sodium 135 - 145 mmol/L 132  131  133   Potassium 3.5 - 5.1 mmol/L 3.7  3.9  3.9   Chloride  98 - 111 mmol/L 97  99  102   CO2 22 - 32 mmol/L 27  25  23    Calcium 8.9 - 10.3 mg/dL 8.7  8.7  8.8      Pertinent Imaging today Plain films and CT images have been personally visualized and interpreted; radiology reports have been reviewed. Decision making incorporated into the Impression   CT ORBITS W CONTRAST  Result Date: 07/12/2022 CLINICAL DATA:  Right periorbital cellulitis EXAM: CT ORBITS WITH CONTRAST TECHNIQUE: Multidetector CT images was performed according to the standard protocol following intravenous contrast administration. RADIATION DOSE REDUCTION: This exam was performed according to the departmental dose-optimization program  which includes automated exposure control, adjustment of the mA and/or kV according to patient size and/or use of iterative reconstruction technique. CONTRAST:  75mL OMNIPAQUE IOHEXOL 300 MG/ML  SOLN COMPARISON:  None Available. FINDINGS: Orbits: There is a large degree of edema within the right periorbital soft tissues. Additionally, there is mild fat stranding within the medial inferior right orbit. There is no subperiosteal abscess along the lamina papyracea. The left orbit is normal. Visible paranasal sinuses: Right ostiomeatal complex pattern sinusitis Soft tissues: Right periorbital soft tissue swelling as above Osseous: Negative Limited intracranial: Normal IMPRESSION: 1. Right orbital and periorbital cellulitis with mild inflammatory change of the medial extraconal fat and marked right periorbital edema. 2. Right ostiomeatal complex sinusitis is a possible source. Electronically Signed   By: Deatra Robinson M.D.   On: 07/12/2022 22:22     I have personally spent 53  minutes involved in face-to-face and non-face-to-face activities for this patient on the day of the visit. Professional time spent includes the following activities: Preparing to see the patient (review of tests), Obtaining and/or reviewing separately obtained history (admission/discharge  record), Performing a medically appropriate examination and/or evaluation , Ordering medications/tests/procedures, referring and communicating with other health care professionals, Documenting clinical information in the EMR, Independently interpreting results (not separately reported), Communicating results to the patient/family/caregiver, Counseling and educating the patient/family/caregiver and Care coordination (not separately reported).   Plan d/w requesting provider as well as ID pharm D  Note: This document was prepared using dragon voice recognition software and may include unintentional dictation errors.   Electronically signed by:   Odette Fraction, MD Infectious Disease Physician [facility] Pager: [phone]

## 2022-07-13 NOTE — Progress Notes (Signed)
[facility] here to pick up patient for admission to [facility] room [phone]. Patient alert and oriented, verbally responsive and able to make needs/wants known. Patient notified family of transfer while nurse at bedside. Patient belongings where taken by family earlier and she took her cell phone and Consulting civil engineer. This nurse called report to Maurine Minister RN that would be receiving patient.

## 2022-07-13 NOTE — Discharge Summary (Signed)
Physician Discharge Summary  Amanda BLOMBERG Duarte:[id number] DOB: 10-Aug-1955 DOA: 07/09/2022  PCP: Claiborne Rigg, NP  Admit date: 07/09/2022 Discharge date: 07/13/2022  Admitted From: home Disposition:  transfer to [facility] center  Recommendations for Outpatient Follow-up:  [facility] transfer, accepted by Dr Dimas Aguas with internal medicine, discussed also with Dr Karin Lieu with ophthalmology   Home Health: none Equipment/Devices: none  Discharge Condition: stable CODE STATUS: Full code Diet Orders (From admission, onward)     Start     Ordered   07/09/22 1603  Diet Carb Modified Fluid consistency: Thin  Diet effective now       Question Answer Comment  Calorie Level Medium 1600-2000   Fluid consistency: Thin      07/09/22 1603            HPI: Per admitting MD, Amanda Duarte is an 67 y.o. female past medical history of essential hypertension insulin-dependent diabetes mellitus type 2, myelodysplastic syndrome on chemotherapy twice a week, thrombocytopenia neutropenia comes in with right facial swelling   Hospital Course / Discharge diagnoses: Principal Problem:   Periorbital cellulitis Active Problems:   Essential hypertension   MDS (myelodysplastic syndrome), high grade (HCC)   Type 2 diabetes mellitus with hyperlipidemia (HCC)   Pancytopenia (HCC)   Thrombocytopenia (HCC)   Neutropenia (HCC)   Principal problem Periorbital / orbital cellulitis -patient admitted to the hospital with right facial swelling, found to have periorbital and orbital cellulitis.  ID was consulted.  She was placed on broad-spectrum intravenous antibiotics, however she appears to complain of persistent right eye pain as well as headache.  CT of the orbits done 6/18 showed right orbital and periorbital cellulitis and marked right periorbital edema, also a possible source from right ostiomeatal complex sinusitis.  Ophthalmology consulted, discussed with Dr. Allena Katz and recommended transfer to tertiary center.  Call  you, discussed with Dr. Sherral Hammers with ophthalmology who will see patient in consultation, and discussed with Dr. Dimas Aguas with internal medicine, who accepted patient for transfer.  Active problems Insulin-dependent uncontrolled diabetes mellitus type 2 - With a last A1c in April of 10 Pancytopenia/MDS - Last transfusion was on the 13th and last chemotherapy on 07/01/2022. Oncology Dr. Mosetta Putt was consulted did not recommend Granix, status post 1 unit of packed platelets  6/16 and again 6/17, and a unit of packed red blood cells on 6/18 Essential hypertension - Continue current home regimen. Hypokalemia - Replete   Sepsis ruled out   Discharge Instructions   Allergies as of 07/13/2022   No Known Allergies      Medication List     TAKE these medications    amLODipine 5 MG tablet Commonly known as: NORVASC Take 1 tablet (5 mg total) by mouth daily. For blood pressure   atorvastatin 40 MG tablet Commonly known as: LIPITOR Take 1 tablet (40 mg total) by mouth daily. For cholesterol   Basaglar KwikPen 100 UNIT/ML Inject 20 Units into the skin 2 (two) times daily.   Blood Pressure Monitor Devi Please provide patient with insurance approved blood pressure monitor   ceFEPIme 2 g in sodium chloride 0.9 % 100 mL Inject 2 g into the vein every 12 (twelve) hours.   citalopram 20 MG tablet Commonly known as: CELEXA Take 1 tablet (20 mg total) by mouth daily. For depression   fenofibrate 145 MG tablet Commonly known as: TRICOR Take 1 tablet (145 mg total) by mouth daily. For cholesterol   fluticasone 50 MCG/ACT nasal spray Commonly known as:  FLONASE Place 2 sprays into both nostrils daily.   FreeStyle Libre 2 Sensor Misc Monitor blood glucose levels 5-6 times per day E11.65   FreeStyle Libre Reader Devi Monitor blood glucose levels 5-6 times per day E11.65   glucose blood test strip Use to check blood sugar three times daily. E11.65   HumaLOG KwikPen 100 UNIT/ML  KwikPen Generic drug: insulin lispro Inject 10 Units into the skin 3 (three) times daily.   Insulin Pen Needle 31G X 5 MM Misc Inject 1 Device into the skin QID. For use with insulin pens   Insulin Syringe-Needle U-100 30G X 5/16" 1 ML Misc Use as directed to inject into the skin 3 times daily.   levofloxacin 500 MG tablet Commonly known as: Levaquin Tome 1 tableta (500 mg en total) por va oral diariamente. (Take 1 tablet (500 mg total) by mouth daily.)   losartan 50 MG tablet Commonly known as: COZAAR Take 50 mg by mouth daily.   metroNIDAZOLE 500 MG tablet Commonly known as: FLAGYL Take 1 tablet (500 mg total) by mouth every 12 (twelve) hours.   ondansetron 8 MG tablet Commonly known as: Zofran Take 1 tablet (8 mg total) by mouth every 8 (eight) hours as needed for nausea or vomiting.   OneTouch Delica Lancets 33G Misc Use to check blood sugar three times daily. E11.65   OneTouch Verio w/Device Kit Use to check blood sugar three times daily. E11.65   pantoprazole 40 MG tablet Commonly known as: PROTONIX Take 1 tablet (40 mg total) by mouth daily at 6 (six) AM. For heartburn What changed:  when to take this additional instructions   potassium chloride SA 20 MEQ tablet Commonly known as: KLOR-CON M Tome una tableta (20 mEq en total) por va oral diariamente. (Take 1 tablet (20 mEq total) by mouth daily.)   prochlorperazine 10 MG tablet Commonly known as: COMPAZINE Take 1 tablet (10 mg total) by mouth every 6 (six) hours as needed for nausea or vomiting.   sitaGLIPtin-metformin 50-1000 MG tablet Commonly known as: JANUMET Take 1 tablet by mouth 2 (two) times daily with a meal.   vancomycin 750 MG/150ML Soln Commonly known as: VANCOREADY Inject 150 mLs (750 mg total) into the vein daily.       Consultations: ID Ophthalmology   Procedures/Studies:  CT ORBITS W CONTRAST  Result Date: 07/12/2022 CLINICAL DATA:  Right periorbital cellulitis EXAM: CT  ORBITS WITH CONTRAST TECHNIQUE: Multidetector CT images was performed according to the standard protocol following intravenous contrast administration. RADIATION DOSE REDUCTION: This exam was performed according to the departmental dose-optimization program which includes automated exposure control, adjustment of the mA and/or kV according to patient size and/or use of iterative reconstruction technique. CONTRAST:  75mL OMNIPAQUE IOHEXOL 300 MG/ML  SOLN COMPARISON:  None Available. FINDINGS: Orbits: There is a large degree of edema within the right periorbital soft tissues. Additionally, there is mild fat stranding within the medial inferior right orbit. There is no subperiosteal abscess along the lamina papyracea. The left orbit is normal. Visible paranasal sinuses: Right ostiomeatal complex pattern sinusitis Soft tissues: Right periorbital soft tissue swelling as above Osseous: Negative Limited intracranial: Normal IMPRESSION: 1. Right orbital and periorbital cellulitis with mild inflammatory change of the medial extraconal fat and marked right periorbital edema. 2. Right ostiomeatal complex sinusitis is a possible source. Electronically Signed   By: Deatra Robinson M.D.   On: 07/12/2022 22:22   CT Maxillofacial W Contrast  Result Date: 07/09/2022 CLINICAL DATA:  Swelling  about the right eye. Headache. Personal history of breast cancer. EXAM: CT MAXILLOFACIAL WITH CONTRAST TECHNIQUE: Multidetector CT imaging of the maxillofacial structures was performed with intravenous contrast. Multiplanar CT image reconstructions were also generated. RADIATION DOSE REDUCTION: This exam was performed according to the departmental dose-optimization program which includes automated exposure control, adjustment of the mA and/or kV according to patient size and/or use of iterative reconstruction technique. CONTRAST:  75mL OMNIPAQUE IOHEXOL 300 MG/ML  SOLN COMPARISON:  CT head without contrast 04/11/2017 FINDINGS: Osseous: No acute  or focal osseous abnormality is present. Orbits: Right periorbital soft tissue swelling is present. Findings are most prominent inferiorly and medially. No discrete abscess is present. The underlying globe is within normal limits. The lateral gland is unremarkable. Left globe and orbit are normal. Sinuses: Chronic wall thickening is present in the maxillary sinuses bilaterally. No acute disease is present. The paranasal sinuses and mastoid air cells are otherwise clear. Soft tissues: Soft tissues of the face and visualize neck are otherwise within normal limits. Limited intracranial: Within normal limits IMPRESSION: 1. Right periorbital soft tissue swelling without discrete abscess. This is consistent with acute cellulitis. 2. Chronic wall thickening in the maxillary sinuses bilaterally without acute disease. Electronically Signed   By: Marin Roberts M.D.   On: 07/09/2022 15:10     Subjective: Planes of headache mainly on the right side of the head.  Complains of eye pain  Discharge Exam: BP (!) 122/50 (BP Location: Right Arm)   Pulse 84   Temp 99.7 F (37.6 C) (Oral)   Resp 16   Ht 5' (1.524 m)   Wt 55 kg   SpO2 96%   BMI 23.69 kg/m   General: Pt is alert, awake, not in acute distress Cardiovascular: RRR, S1/S2 +, no rubs, no gallops Respiratory: CTA bilaterally, no wheezing, no rhonchi Abdominal: Soft, NT, ND, bowel sounds + Extremities: no edema, no cyanosis   The results of significant diagnostics from this hospitalization (including imaging, microbiology, ancillary and laboratory) are listed below for reference.     Microbiology: Recent Results (from the past 240 hour(s))  Culture, blood (routine x 2)     Status: None (Preliminary result)   Collection Time: 07/09/22  3:38 PM   Specimen: BLOOD  Result Value Ref Range Status   Specimen Description   Final    BLOOD SITE NOT SPECIFIED Performed at [facility], [address], [place], [place]  [address]    Special Requests   Final    BOTTLES DRAWN AEROBIC AND ANAEROBIC Blood Culture adequate volume Performed at [facility], [address] [place], [place] [address]    Culture   Final    NO GROWTH 4 DAYS Performed at [facility], [address], [place], [place] [address]    Report Status PENDING  Incomplete  Culture, blood (routine x 2)     Status: None (Preliminary result)   Collection Time: 07/09/22  3:43 PM   Specimen: BLOOD  Result Value Ref Range Status   Specimen Description   Final    BLOOD LEFT ANTECUBITAL Performed at [facility], [address] [place], [place] [address]    Special Requests   Final    BOTTLES DRAWN AEROBIC AND ANAEROBIC Blood Culture adequate volume Performed at [facility], [address], [place], [place] [address]    Culture   Final    NO GROWTH 4 DAYS Performed at [facility], [address] [place], [place] [address]    Report Status PENDING  Incomplete     Labs: Basic Metabolic Panel: Recent Labs  Lab 07/09/22 1259 07/09/22 1313 07/10/22 0555 07/11/22 0442 07/11/22 0810 07/12/22 0536 07/13/22 0538  NA 132*  --  133*  --  133* 131* 132*  K 4.2  --  3.4*  --  3.9 3.9 3.7  CL 97*  --  99  --  102 99 97*  CO2 25  --  26  --  23 25 27   GLUCOSE 354*  --  304*  --  186* 293* 127*  BUN 14  --  11  --  11 10 10   CREATININE 0.60   < > 0.55 0.44 0.41* 0.45 0.53  CALCIUM 8.8*  --  8.7*  --  8.8* 8.7* 8.7*   < > = values in this interval not displayed.   Liver Function Tests: No results for input(s): "AST", "ALT", "ALKPHOS", "BILITOT", "PROT", "ALBUMIN" in the last 168 hours. CBC: Recent Labs  Lab 07/09/22 1259 07/10/22 0555 07/11/22 0810 07/12/22 0536 07/13/22 0538  WBC 1.0* 1.1* 1.0* 1.0* 1.3*  NEUTROABS 0.1* 0.1* 0.2* 0.1* 0.1*  HGB 8.6* 8.2* 8.1* 7.5* 9.3*  HCT 25.2* 23.8* 23.8* 21.8* 27.1*  MCV 86.9 86.5 88.1 86.9 85.0  PLT 10* 19* 13* 29* 21*    CBG: Recent Labs  Lab 07/12/22 0734 07/12/22 1148 07/12/22 1655 07/12/22 2109 07/13/22 0731  GLUCAP 195* 255* 133* 218* 125*   Hgb A1c No results for input(s): "HGBA1C" in the last 72 hours. Lipid Profile No results for input(s): "CHOL", "HDL", "LDLCALC", "TRIG", "CHOLHDL", "LDLDIRECT" in the last 72 hours. Thyroid function studies No results for input(s): "TSH", "T4TOTAL", "T3FREE", "THYROIDAB" in the last 72 hours.  Invalid input(s): "FREET3" Urinalysis    Component Value Date/Time   COLORURINE YELLOW 01/29/2022 1604   APPEARANCEUR CLEAR 01/29/2022 1604   LABSPEC 1.014 01/29/2022 1604   PHURINE 6.0 01/29/2022 1604   GLUCOSEU >=500 (A) 01/29/2022 1604   HGBUR NEGATIVE 01/29/2022 1604   BILIRUBINUR NEGATIVE 01/29/2022 1604   BILIRUBINUR negative 02/15/2021 1330   KETONESUR NEGATIVE 01/29/2022 1604   PROTEINUR NEGATIVE 01/29/2022 1604   UROBILINOGEN 0.2 02/15/2021 1330   UROBILINOGEN 1.0 04/04/2016 2002   NITRITE NEGATIVE 01/29/2022 1604   LEUKOCYTESUR NEGATIVE 01/29/2022 1604    FURTHER DISCHARGE INSTRUCTIONS:   Get Medicines reviewed and adjusted: Please take all your medications with you for your next visit with your Primary MD   Laboratory/radiological data: Please request your Primary MD to go over all hospital tests and procedure/radiological results at the follow up, please ask your Primary MD to get all Hospital records sent to his/her office.   In some cases, they will be blood work, cultures and biopsy results pending at the time of your discharge. Please request that your primary care M.D. goes through all the records of your hospital data and follows up on these results.   Also Note the following: If you experience worsening of your admission symptoms, develop shortness of breath, life threatening emergency, suicidal or homicidal thoughts you must seek medical attention immediately by calling 911 or calling your MD immediately  if symptoms less severe.    You must read complete instructions/literature along with all the possible adverse reactions/side effects for all the Medicines you take and that have been prescribed to you. Take any new Medicines after you have completely understood and accpet all the possible adverse reactions/side effects.    Do  not drive when taking Pain medications or sleeping medications (Benzodaizepines)   Do not take more than prescribed Pain, Sleep and Anxiety Medications. It is not advisable to combine anxiety,sleep and pain medications without talking with your primary care practitioner   Special Instructions: If you have smoked or chewed Tobacco  in the last 2 yrs please stop smoking, stop any regular Alcohol  and or any Recreational drug use.   Wear Seat belts while driving.   Please note: You were cared for by a hospitalist during your hospital stay. Once you are discharged, your primary care physician will handle any further medical issues. Please note that NO REFILLS for any discharge medications will be authorized once you are discharged, as it is imperative that you return to your primary care physician (or establish a relationship with a primary care physician if you do not have one) for your post hospital discharge needs so that they can reassess your need for medications and monitor your lab values.  Time coordinating discharge: 35 minutes  SIGNED:  Pamella Pert, MD, PhD 07/13/2022, 11:30 AM

## 2022-07-13 NOTE — Inpatient Diabetes Management (Signed)
Inpatient Diabetes Program Recommendations  AACE/ADA: New Consensus Statement on Inpatient Glycemic Control (2015)  Target Ranges:  Prepandial:   less than 140 mg/dL      Peak postprandial:   less than 180 mg/dL (1-2 hours)      Critically ill patients:  140 - 180 mg/dL   Lab Results  Component Value Date   GLUCAP 125 (H) 07/13/2022   HGBA1C 11.9 (H) 07/10/2022    Review of Glycemic Control  Latest Reference Range & Units 07/12/22 07:34 07/12/22 11:48 07/12/22 16:55 07/12/22 21:09 07/13/22 07:31  Glucose-Capillary 70 - 99 mg/dL 161 (H) 096 (H) 045 (H) 218 (H) 125 (H)  (H): Data is abnormally high  Diabetes history: DM2 Outpatient Diabetes medications: Humalog 20 units TID, Glargine 20 units BID Current orders for Inpatient glycemic control: Levemir 20 units BID, Novolog 0-15 units TID and 0-5 units at bedtime, Novolog 4 units TID  Inpatient Diabetes Program Recommendations:    Might consider increasing meal coverage:  Novolog 5 units TID with meals if she consumes at least 50%.  Will continue to follow while inpatient.  Thank you, Dulce Sellar, MSN, CDCES Diabetes Coordinator Inpatient Diabetes Program [phone] (team pager from 8a-5p)

## 2022-07-14 ENCOUNTER — Inpatient Hospital Stay: Payer: Medicare Other | Admitting: Hematology

## 2022-07-14 ENCOUNTER — Ambulatory Visit: Payer: Medicare Other

## 2022-07-14 ENCOUNTER — Inpatient Hospital Stay: Payer: Medicare Other

## 2022-07-14 LAB — CULTURE, BLOOD (ROUTINE X 2)
Culture: NO GROWTH
Special Requests: ADEQUATE

## 2022-07-15 ENCOUNTER — Inpatient Hospital Stay: Payer: Medicare Other

## 2022-07-15 ENCOUNTER — Telehealth: Payer: Self-pay | Admitting: Hematology

## 2022-07-18 ENCOUNTER — Inpatient Hospital Stay: Payer: Medicare Other

## 2022-07-18 ENCOUNTER — Other Ambulatory Visit: Payer: Self-pay

## 2022-07-18 NOTE — Progress Notes (Signed)
Pt is currently admitted at [facility]; therefore, pt's appts for 07/18/2022 cancelled.  Pt is currently scheduled for 07/21/2022 as well.  If pt is NOT discharged from [facility] by 07/21/2022, cancel the pt's appts that day.

## 2022-07-19 ENCOUNTER — Telehealth: Payer: Self-pay | Admitting: *Deleted

## 2022-07-19 NOTE — Patient Outreach (Signed)
  Care Coordination   Follow Up Visit Note   07/19/2022 Name: [name] MRN: [id number] DOB: January 28, 1955  [name] is a 67 y.o. year old female who sees Claiborne Rigg, NP for primary care.   What matters to the patients health and wellness today?  Manage DM     Goals Addressed             This Visit's Progress    Patient will report she is getting adequate food to support herself and her grandson in the next month.   On track         Interventions Today    Flowsheet Row Most Recent Value  Chronic Disease   Chronic disease during today's visit Diabetes  General Interventions   General Interventions Discussed/Reviewed Durable Medical Equipment (DME), Communication with  Durable Medical Equipment (DME) Glucomoter  Communication with Pharmacists  [Call placed to [employer] to inquire about CGM monitor coverage, notified that patient does not qualify for it due to Medicare Part D not covering device and unable to bill for it under Part B.  Will continue to encouraged fingersticks]             SDOH assessments and interventions completed:  No     Care Coordination Interventions:  Yes, provided   Follow up plan: Follow up call scheduled for 7/9    Encounter Outcome:  Pt. Visit Completed   Kemper Durie, RN, MSN, [facility] Care Management Care Management Coordinator [phone]

## 2022-07-20 ENCOUNTER — Other Ambulatory Visit: Payer: Self-pay

## 2022-07-20 ENCOUNTER — Telehealth: Payer: Self-pay

## 2022-07-20 NOTE — Transitions of Care (Post Inpatient/ED Visit) (Signed)
07/20/2022  Name: Amanda Duarte MRN: [id number] DOB: November 26, 1955  Today's TOC FU Call Status: Today's TOC FU Call Status:: Successful TOC FU Call Competed TOC FU Call Complete Date: 07/20/22 (Contacted patient via [facility] Interpreter-Spanish-ID # [id number])  Transition Care Management Follow-up Telephone Call Date of Discharge: 07/18/22 Discharge Facility: Other ([facility]) Name of Other ([facility]) Discharge Facility: [facility] Type of Discharge: Inpatient Admission Primary Inpatient Discharge Diagnosis:: "cellulits of right orbit" How have you been since you were released from the hospital?:  (Pt states she is doing better. Her eyes feel better--only having slight drainage from eye-taking eye drops and abxs. She has occassional headaches-managed with meds but none at present) Any questions or concerns?: Yes Patient Questions/Concerns:: pt wanting to know if she will have to have eye surgery Patient Questions/Concerns Addressed: Other: (Advised pt to discuss this with provider during follow up appt)  Items Reviewed: Did you receive and understand the discharge instructions provided?: Yes Medications obtained,verified, and reconciled?: Partial Review Completed Reason for Partial Mediation Review: reviewed new/changed meds with pt-pt states that her daughter manages meds for her Any new allergies since your discharge?: No Dietary orders reviewed?: Yes Type of Diet Ordered:: low salt/heart healthy/carb modified Do you have support at home?: Yes People in Home: child(ren), adult Name of Support/Comfort Primary Source: [place]  Medications Reviewed Today: Medications Reviewed Today     Reviewed by Charlyn Minerva, RN (Registered Nurse) on 07/20/22 at 1232  Med List Status: <None>   Medication Order Taking? Sig Documenting Provider Last Dose Status Informant  acetaminophen (TYLENOL) 325 MG tablet [id number] Yes Take 650 mg by mouth every 4 (four) hours as needed for  moderate pain. [provider] Taking Active Self  amLODipine (NORVASC) 5 MG tablet [id number]  Take 1 tablet (5 mg total) by mouth daily. For blood pressure Claiborne Rigg, NP  Active Family Member  amoxicillin-clavulanate (AUGMENTIN) 875-125 MG tablet [id number] Yes Take 1 tablet by mouth 2 (two) times daily. [provider] Taking Active Self  atorvastatin (LIPITOR) 40 MG tablet [id number]  Take 1 tablet (40 mg total) by mouth daily. For cholesterol Claiborne Rigg, NP  Active Family Member  Blood Glucose Monitoring Suppl [facility] VERIO) w/Device KIT [id number]  Use to check blood sugar three times daily. E11.65 Hoy Register, MD  Active   Blood Pressure Monitor DEVI [id number]  Please provide patient with insurance approved blood pressure monitor Claiborne Rigg, NP  Active   carboxymethylcellulose (REFRESH PLUS) 0.5 % SOLN [id number] Yes Place 1 drop into the right eye 4 (four) times daily. [provider] Taking Active Self  ceFEPIme 2 g in sodium chloride 0.9 % 100 mL [id number]  Inject 2 g into the vein every 12 (twelve) hours. Leatha Gilding, MD  Active   citalopram (CELEXA) 20 MG tablet [id number]  Take 1 tablet (20 mg total) by mouth daily. For depression  Patient not taking: Reported on 07/09/2022   Claiborne Rigg, NP  Active Family Member  Continuous Glucose Receiver ([address] [place] READER) [place] [id number]  Monitor blood glucose levels 5-6 times per day E11.65 Claiborne Rigg, NP  Active   Continuous Glucose Sensor (FREESTYLE LIBRE 2 SENSOR) [place] [id number]  Monitor blood glucose levels 5-6 times per day E11.65 Claiborne Rigg, NP  Active   erythromycin ophthalmic ointment [id number] Yes Place 1 Application into the right eye 3 (three) times daily. [provider] Taking Active Self  fenofibrate (TRICOR) 145 MG tablet [id number]  Take  1 tablet (145 mg total) by mouth daily. For cholesterol Claiborne Rigg, NP  Active   fluticasone (FLONASE) 50  MCG/ACT nasal spray [id number]  Place 2 sprays into both nostrils daily.  Patient not taking: Reported on 07/09/2022   Claiborne Rigg, NP  Active Family Member  glucose blood test strip [id number]  Use to check blood sugar three times daily. E11.65 Hoy Register, MD  Active   HUMALOG KWIKPEN 100 UNIT/ML KwikPen [id number]  Inject 10 Units into the skin 3 (three) times daily. Hoy Register, MD  Active Family Member  Insulin Glargine [facility]) 100 UNIT/ML [id number]  Inject 20 Units into the skin 2 (two) times daily. Hoy Register, MD  Active Family Member  Insulin Pen Needle 31G X 5 MM MISC [id number]  Inject 1 Device into the skin QID. For use with insulin pens Hoy Register, MD  Active   Insulin Syringe-Needle U-100 30G X 5/16" 1 ML MISC [id number]  Use as directed to inject into the skin 3 times daily. Claiborne Rigg, NP  Active   levofloxacin (LEVAQUIN) 500 MG tablet [id number]  Take 1 tablet (500 mg total) by mouth daily.  Patient not taking: Reported on 07/09/2022   Malachy Mood, MD  Active Family Member  losartan (COZAAR) 50 MG tablet [id number]  Take 50 mg by mouth daily. [provider]  Active Family Member  metroNIDAZOLE (FLAGYL) 500 MG tablet [id number]  Take 1 tablet (500 mg total) by mouth every 12 (twelve) hours. Leatha Gilding, MD  Active   ondansetron [facility]) 8 MG tablet [id number]  Take 1 tablet (8 mg total) by mouth every 8 (eight) hours as needed for nausea or vomiting.  Patient not taking: Reported on 07/09/2022   Malachy Mood, MD  Active Family Member  [facility] Lancets 33G MISC [id number]  Use to check blood sugar three times daily. E11.65 Hoy Register, MD  Active   oxyCODONE (OXY IR/ROXICODONE) 5 MG immediate release tablet [id number] Yes Take 5 mg by mouth every 4 (four) hours as needed for severe pain. Take 0.5 to 1 tablet (2.5-5 mg total) by mouth every 4 (four) hours as needed for up to 5 days [provider] Taking Active Self  pantoprazole  (PROTONIX) 40 MG tablet [id number]  Take 1 tablet (40 mg total) by mouth daily at 6 (six) AM. For heartburn  Patient taking differently: Take 40 mg by mouth daily before breakfast.   Claiborne Rigg, NP  Active Family Member  potassium chloride SA (KLOR-CON M) 20 MEQ tablet [id number]  Take 1 tablet (20 mEq total) by mouth daily. Claiborne Rigg, NP  Active   prochlorperazine (COMPAZINE) 10 MG tablet [id number]  Take 1 tablet (10 mg total) by mouth every 6 (six) hours as needed for nausea or vomiting.  Patient not taking: Reported on 07/09/2022   Malachy Mood, MD  Active Family Member  sitaGLIPtin-metformin [facility]) 50-1000 MG tablet [id number]  Take 1 tablet by mouth 2 (two) times daily with a meal. [provider]  Active Family Member  vancomycin (VANCOREADY) 750 MG/150ML SOLN [id number]  Inject 150 mLs (750 mg total) into the vein daily. Leatha Gilding, MD  Active             Home Care and Equipment/Supplies: Were Home Health Services Ordered?: NA Any new equipment or medical supplies ordered?: NA  Functional Questionnaire: Do you need assistance with bathing/showering or dressing?: No Do you need assistance with meal preparation?: No Do you need  assistance with eating?: No Do you have difficulty maintaining continence: No Do you need assistance with getting out of bed/getting out of a chair/moving?: No Do you have difficulty managing or taking your medications?: Yes (dtr assists)  Follow up appointments reviewed: PCP Follow-up appointment confirmed?: Yes Date of PCP follow-up appointment?: 08/05/22 Follow-up Provider: Thalia Party Specialist [facility] Follow-up appointment confirmed?: Yes Date of Specialist follow-up appointment?: 07/25/22 Follow-Up Specialty Provider:: [facility] Do you need transportation to your follow-up appointment?: No (pt confirms daughter will be able to take her to appts) Do you understand care options if your condition(s)  worsen?: Yes-patient verbalized understanding  SDOH Interventions Today    Flowsheet Row Most Recent Value  SDOH Interventions   Transportation Interventions Intervention Not Indicated      TOC Interventions Today    Flowsheet Row Most Recent Value  TOC Interventions   TOC Interventions Discussed/Reviewed TOC Interventions Discussed, Arranged PCP follow up less than 12 days/Care Guide scheduled      Interventions Today    Flowsheet Row Most Recent Value  General Interventions   General Interventions Discussed/Reviewed General Interventions Discussed, Communication with  Doctor Visits Discussed/Reviewed Doctor Visits Discussed, PCP, Specialist  Durable Medical Equipment (DME) Glucomoter  [pt reports she is waiting on getting new meter]  PCP/Specialist Visits Compliance with follow-up visit  Communication with RN  [note routed to assigned RN]  Education Interventions   Education Provided Provided Education  Provided Verbal Education On Nutrition, Blood Sugar Monitoring, Medication, When to see the doctor  Nutrition Interventions   Nutrition Discussed/Reviewed Nutrition Reviewed  Pharmacy Interventions   Pharmacy Dicussed/Reviewed Pharmacy Topics Discussed, Medications and their functions      Alessandra Grout [facility] Care Management Care Management Community Coordinator Direct Phone: [phone] Toll Free: [phone] Fax: [phone]

## 2022-07-21 ENCOUNTER — Inpatient Hospital Stay: Payer: Medicare Other

## 2022-07-21 ENCOUNTER — Other Ambulatory Visit: Payer: Self-pay

## 2022-07-21 DIAGNOSIS — Z170 Estrogen receptor positive status [ER+]: Secondary | ICD-10-CM

## 2022-07-21 DIAGNOSIS — D649 Anemia, unspecified: Secondary | ICD-10-CM

## 2022-07-21 DIAGNOSIS — D696 Thrombocytopenia, unspecified: Secondary | ICD-10-CM

## 2022-07-21 DIAGNOSIS — D46Z Other myelodysplastic syndromes: Secondary | ICD-10-CM

## 2022-07-21 DIAGNOSIS — Z5111 Encounter for antineoplastic chemotherapy: Secondary | ICD-10-CM | POA: Diagnosis present

## 2022-07-21 DIAGNOSIS — D469 Myelodysplastic syndrome, unspecified: Secondary | ICD-10-CM | POA: Diagnosis present

## 2022-07-21 LAB — CBC WITH DIFFERENTIAL (CANCER CENTER ONLY)
Abs Immature Granulocytes: 0.03 10*3/uL (ref 0.00–0.07)
Basophils Absolute: 0 10*3/uL (ref 0.0–0.1)
Basophils Relative: 1 %
Eosinophils Absolute: 0 10*3/uL (ref 0.0–0.5)
Eosinophils Relative: 3 %
HCT: 23.3 % — ABNORMAL LOW (ref 36.0–46.0)
Hemoglobin: 8.2 g/dL — ABNORMAL LOW (ref 12.0–15.0)
Immature Granulocytes: 3 %
Lymphocytes Relative: 80 %
Lymphs Abs: 0.8 10*3/uL (ref 0.7–4.0)
MCH: 30 pg (ref 26.0–34.0)
MCHC: 35.2 g/dL (ref 30.0–36.0)
MCV: 85.3 fL (ref 80.0–100.0)
Monocytes Absolute: 0.1 10*3/uL (ref 0.1–1.0)
Monocytes Relative: 10 %
Neutro Abs: 0 10*3/uL — CL (ref 1.7–7.7)
Neutrophils Relative %: 3 %
Platelet Count: 10 10*3/uL — ABNORMAL LOW (ref 150–400)
RBC: 2.73 MIL/uL — ABNORMAL LOW (ref 3.87–5.11)
RDW: 13.2 % (ref 11.5–15.5)
Smear Review: NORMAL
WBC Count: 1 10*3/uL — ABNORMAL LOW (ref 4.0–10.5)
nRBC: 6 % — ABNORMAL HIGH (ref 0.0–0.2)

## 2022-07-21 LAB — BPAM PLATELET PHERESIS: Blood Product Expiration Date: 202406282359

## 2022-07-21 LAB — PREPARE PLATELET PHERESIS: Unit division: 0

## 2022-07-21 LAB — SAMPLE TO BLOOD BANK

## 2022-07-21 MED ORDER — DIPHENHYDRAMINE HCL 25 MG PO CAPS
25.0000 mg | ORAL_CAPSULE | Freq: Once | ORAL | Status: AC
  Administered 2022-07-21: 25 mg via ORAL
  Filled 2022-07-21: qty 1

## 2022-07-21 MED ORDER — ACETAMINOPHEN 325 MG PO TABS
650.0000 mg | ORAL_TABLET | Freq: Once | ORAL | Status: AC
  Administered 2022-07-21: 650 mg via ORAL
  Filled 2022-07-21: qty 2

## 2022-07-21 MED ORDER — SODIUM CHLORIDE 0.9% IV SOLUTION
250.0000 mL | Freq: Once | INTRAVENOUS | Status: AC
  Administered 2022-07-21: 250 mL via INTRAVENOUS

## 2022-07-21 NOTE — Progress Notes (Signed)
Followed up on referral to Dr. Herbert Moors with [facility] - Hematology & Oncology Team regarding referral.  Spoke w/New Patient Referral Team and they stated they never received original referral.  Refaxed referral today to Fax# [phone].  Fax confirmation received.

## 2022-07-22 LAB — PREPARE PLATELET PHERESIS

## 2022-07-22 LAB — BPAM PLATELET PHERESIS
ISSUE DATE / TIME: 202406271414
Unit Type and Rh: 6200

## 2022-07-25 ENCOUNTER — Other Ambulatory Visit: Payer: Medicare Other

## 2022-07-26 ENCOUNTER — Other Ambulatory Visit: Payer: Self-pay

## 2022-07-26 ENCOUNTER — Other Ambulatory Visit: Payer: Medicare Other

## 2022-07-26 ENCOUNTER — Inpatient Hospital Stay: Payer: Medicare Other | Attending: Hematology

## 2022-07-26 ENCOUNTER — Inpatient Hospital Stay: Payer: Medicare Other

## 2022-07-26 DIAGNOSIS — Z170 Estrogen receptor positive status [ER+]: Secondary | ICD-10-CM

## 2022-07-26 DIAGNOSIS — E876 Hypokalemia: Secondary | ICD-10-CM | POA: Diagnosis present

## 2022-07-26 DIAGNOSIS — Z1152 Encounter for screening for COVID-19: Secondary | ICD-10-CM

## 2022-07-26 DIAGNOSIS — D649 Anemia, unspecified: Secondary | ICD-10-CM

## 2022-07-26 DIAGNOSIS — Z9012 Acquired absence of left breast and nipple: Secondary | ICD-10-CM

## 2022-07-26 DIAGNOSIS — D61818 Other pancytopenia: Secondary | ICD-10-CM | POA: Diagnosis present

## 2022-07-26 DIAGNOSIS — Z9221 Personal history of antineoplastic chemotherapy: Secondary | ICD-10-CM

## 2022-07-26 DIAGNOSIS — D6959 Other secondary thrombocytopenia: Secondary | ICD-10-CM | POA: Diagnosis present

## 2022-07-26 DIAGNOSIS — Z833 Family history of diabetes mellitus: Secondary | ICD-10-CM

## 2022-07-26 DIAGNOSIS — E1165 Type 2 diabetes mellitus with hyperglycemia: Secondary | ICD-10-CM | POA: Diagnosis present

## 2022-07-26 DIAGNOSIS — Z8249 Family history of ischemic heart disease and other diseases of the circulatory system: Secondary | ICD-10-CM

## 2022-07-26 DIAGNOSIS — D46Z Other myelodysplastic syndromes: Secondary | ICD-10-CM

## 2022-07-26 DIAGNOSIS — R509 Fever, unspecified: Secondary | ICD-10-CM | POA: Diagnosis not present

## 2022-07-26 DIAGNOSIS — D696 Thrombocytopenia, unspecified: Secondary | ICD-10-CM

## 2022-07-26 DIAGNOSIS — E872 Acidosis, unspecified: Secondary | ICD-10-CM | POA: Diagnosis present

## 2022-07-26 DIAGNOSIS — J9601 Acute respiratory failure with hypoxia: Secondary | ICD-10-CM | POA: Diagnosis not present

## 2022-07-26 DIAGNOSIS — Z66 Do not resuscitate: Secondary | ICD-10-CM | POA: Diagnosis present

## 2022-07-26 DIAGNOSIS — J329 Chronic sinusitis, unspecified: Secondary | ICD-10-CM | POA: Diagnosis present

## 2022-07-26 DIAGNOSIS — R5081 Fever presenting with conditions classified elsewhere: Secondary | ICD-10-CM | POA: Diagnosis present

## 2022-07-26 DIAGNOSIS — D469 Myelodysplastic syndrome, unspecified: Secondary | ICD-10-CM | POA: Diagnosis present

## 2022-07-26 DIAGNOSIS — Z7984 Long term (current) use of oral hypoglycemic drugs: Secondary | ICD-10-CM

## 2022-07-26 DIAGNOSIS — Z853 Personal history of malignant neoplasm of breast: Secondary | ICD-10-CM

## 2022-07-26 DIAGNOSIS — G9341 Metabolic encephalopathy: Secondary | ICD-10-CM | POA: Diagnosis not present

## 2022-07-26 DIAGNOSIS — I959 Hypotension, unspecified: Secondary | ICD-10-CM | POA: Diagnosis present

## 2022-07-26 DIAGNOSIS — H04301 Unspecified dacryocystitis of right lacrimal passage: Secondary | ICD-10-CM | POA: Diagnosis present

## 2022-07-26 DIAGNOSIS — B9620 Unspecified Escherichia coli [E. coli] as the cause of diseases classified elsewhere: Secondary | ICD-10-CM | POA: Diagnosis present

## 2022-07-26 DIAGNOSIS — I10 Essential (primary) hypertension: Secondary | ICD-10-CM | POA: Diagnosis present

## 2022-07-26 DIAGNOSIS — R6520 Severe sepsis without septic shock: Secondary | ICD-10-CM | POA: Diagnosis not present

## 2022-07-26 DIAGNOSIS — A419 Sepsis, unspecified organism: Secondary | ICD-10-CM | POA: Diagnosis not present

## 2022-07-26 DIAGNOSIS — Z923 Personal history of irradiation: Secondary | ICD-10-CM

## 2022-07-26 DIAGNOSIS — E785 Hyperlipidemia, unspecified: Secondary | ICD-10-CM | POA: Diagnosis present

## 2022-07-26 DIAGNOSIS — D849 Immunodeficiency, unspecified: Secondary | ICD-10-CM | POA: Diagnosis present

## 2022-07-26 DIAGNOSIS — Z8673 Personal history of transient ischemic attack (TIA), and cerebral infarction without residual deficits: Secondary | ICD-10-CM

## 2022-07-26 DIAGNOSIS — J189 Pneumonia, unspecified organism: Secondary | ICD-10-CM | POA: Diagnosis not present

## 2022-07-26 DIAGNOSIS — Z5941 Food insecurity: Secondary | ICD-10-CM

## 2022-07-26 DIAGNOSIS — D709 Neutropenia, unspecified: Principal | ICD-10-CM | POA: Diagnosis present

## 2022-07-26 DIAGNOSIS — Z5986 Financial insecurity: Secondary | ICD-10-CM

## 2022-07-26 DIAGNOSIS — F32A Depression, unspecified: Secondary | ICD-10-CM | POA: Diagnosis present

## 2022-07-26 DIAGNOSIS — Z515 Encounter for palliative care: Secondary | ICD-10-CM

## 2022-07-26 DIAGNOSIS — H05011 Cellulitis of right orbit: Secondary | ICD-10-CM | POA: Diagnosis present

## 2022-07-26 DIAGNOSIS — R9431 Abnormal electrocardiogram [ECG] [EKG]: Secondary | ICD-10-CM | POA: Diagnosis present

## 2022-07-26 DIAGNOSIS — Z79899 Other long term (current) drug therapy: Secondary | ICD-10-CM

## 2022-07-26 DIAGNOSIS — Z794 Long term (current) use of insulin: Secondary | ICD-10-CM

## 2022-07-26 DIAGNOSIS — N390 Urinary tract infection, site not specified: Secondary | ICD-10-CM | POA: Diagnosis present

## 2022-07-26 DIAGNOSIS — E871 Hypo-osmolality and hyponatremia: Secondary | ICD-10-CM | POA: Diagnosis present

## 2022-07-26 DIAGNOSIS — J918 Pleural effusion in other conditions classified elsewhere: Secondary | ICD-10-CM | POA: Diagnosis present

## 2022-07-26 DIAGNOSIS — Z603 Acculturation difficulty: Secondary | ICD-10-CM | POA: Diagnosis present

## 2022-07-26 LAB — CMP (CANCER CENTER ONLY)
ALT: 11 U/L (ref 0–44)
AST: 14 U/L — ABNORMAL LOW (ref 15–41)
Albumin: 3.3 g/dL — ABNORMAL LOW (ref 3.5–5.0)
Alkaline Phosphatase: 74 U/L (ref 38–126)
Anion gap: 6 (ref 5–15)
BUN: 14 mg/dL (ref 8–23)
CO2: 27 mmol/L (ref 22–32)
Calcium: 8.6 mg/dL — ABNORMAL LOW (ref 8.9–10.3)
Chloride: 98 mmol/L (ref 98–111)
Creatinine: 0.59 mg/dL (ref 0.44–1.00)
GFR, Estimated: >60 mL/min (ref >60)
Glucose, Bld: 186 mg/dL — ABNORMAL HIGH (ref 70–99)
Potassium: 4.1 mmol/L (ref 3.5–5.1)
Sodium: 131 mmol/L — ABNORMAL LOW (ref 135–145)
Total Bilirubin: 1 mg/dL (ref 0.3–1.2)
Total Protein: 7.7 g/dL (ref 6.5–8.1)

## 2022-07-26 LAB — CBC WITH DIFFERENTIAL (CANCER CENTER ONLY)
Abs Immature Granulocytes: 0 10*3/uL (ref 0.00–0.07)
Basophils Absolute: 0 10*3/uL (ref 0.0–0.1)
Basophils Relative: 1 %
Eosinophils Absolute: 0 10*3/uL (ref 0.0–0.5)
Eosinophils Relative: 0 %
HCT: 18.4 % — ABNORMAL LOW (ref 36.0–46.0)
Hemoglobin: 6.3 g/dL — CL (ref 12.0–15.0)
Immature Granulocytes: 0 %
Lymphocytes Relative: 64 %
Lymphs Abs: 0.8 10*3/uL (ref 0.7–4.0)
MCH: 29.2 pg (ref 26.0–34.0)
MCHC: 34.2 g/dL (ref 30.0–36.0)
MCV: 85.2 fL (ref 80.0–100.0)
Monocytes Absolute: 0.2 10*3/uL (ref 0.1–1.0)
Monocytes Relative: 15 %
Neutro Abs: 0.3 10*3/uL — CL (ref 1.7–7.7)
Neutrophils Relative %: 20 %
Platelet Count: 18 10*3/uL — ABNORMAL LOW (ref 150–400)
RBC: 2.16 MIL/uL — ABNORMAL LOW (ref 3.87–5.11)
RDW: 13.1 % (ref 11.5–15.5)
Smear Review: NORMAL
WBC Count: 1.3 10*3/uL — ABNORMAL LOW (ref 4.0–10.5)
nRBC: 2.3 % — ABNORMAL HIGH (ref 0.0–0.2)

## 2022-07-26 LAB — SAMPLE TO BLOOD BANK

## 2022-07-26 LAB — TYPE AND SCREEN: ABO/RH(D): O POS

## 2022-07-26 LAB — BPAM PLATELET PHERESIS

## 2022-07-26 LAB — BPAM RBC
ISSUE DATE / TIME: 202407021058
Unit Type and Rh: 5100

## 2022-07-26 LAB — PREPARE PLATELET PHERESIS

## 2022-07-26 LAB — PREPARE RBC (CROSSMATCH)

## 2022-07-26 MED ORDER — ACETAMINOPHEN 325 MG PO TABS
650.0000 mg | ORAL_TABLET | Freq: Once | ORAL | Status: AC
  Administered 2022-07-26: 650 mg via ORAL
  Filled 2022-07-26: qty 2

## 2022-07-26 MED ORDER — SODIUM CHLORIDE 0.9% IV SOLUTION
250.0000 mL | Freq: Once | INTRAVENOUS | Status: AC
  Administered 2022-07-26: 250 mL via INTRAVENOUS

## 2022-07-26 MED ORDER — DIPHENHYDRAMINE HCL 25 MG PO CAPS
25.0000 mg | ORAL_CAPSULE | Freq: Once | ORAL | Status: AC
  Administered 2022-07-26: 25 mg via ORAL
  Filled 2022-07-26: qty 1

## 2022-07-26 MED ORDER — METHYLPREDNISOLONE SODIUM SUCC 40 MG IJ SOLR
40.0000 mg | Freq: Once | INTRAMUSCULAR | Status: AC
  Administered 2022-07-26: 40 mg via INTRAVENOUS
  Filled 2022-07-26: qty 1

## 2022-07-26 NOTE — Patient Instructions (Signed)
Transfusin de [employer], cuidados posteriores Blood Transfusion, Adult, Care After La siguiente informacin ofrece orientacin sobre cmo cuidarse despus del procedimiento. El mdico tambin podr darle instrucciones ms especficas. Comunquese con el mdico si tiene problemas o preguntas. Qu puedo esperar despus del procedimiento? Despus del procedimiento, es normal tener los siguientes sntomas: Dolor y Counsellor donde se coloc la va intravenosa (IV). Dolor de [place]. Siga estas instrucciones en su casa: Cuidado del lugar de la insercin de la va intravenosa (IV)     Siga las instrucciones del mdico acerca del cuidado del lugar de la insercin de la va intravenosa (IV). Asegrese de hacer lo siguiente: Lvese las manos con agua y jabn durante al menos 20 segundos antes y despus de cambiarse la venda (vendaje). Use desinfectante para manos si no dispone de [place] y [place]. Cambie los vendajes como se lo haya indicado el mdico. Haematologist de insercin de la va intravenosa (IV) todos los das para descartar signos de infeccin. Est atento a los siguientes signos: Dolor, hinchazn o enrojecimiento. Sangrado proveniente del Environmental consultant. Calor. Pus o mal olor. Instrucciones generales Use los medicamentos de venta libre y los recetados solamente como se lo haya indicado el mdico. Haga reposo como se lo haya indicado el mdico. Retome sus actividades normales como se lo haya indicado el mdico. Concurra a todas las visitas de seguimiento. Es posible que sea necesario realizar anlisis de laboratorio en ciertos perodos para volver a Chief Operating Officer sus recuentos sanguneos. Comunquese con un mdico si: Tiene picazn o zonas enrojecidas e hinchadas en la piel (urticaria). Tiene fiebre o escalofros. Tiene dolor de cabeza, en la espalda o el pecho. Se siente ansioso o dbil despus de realizar sus actividades habituales. Tiene enrojecimiento, hinchazn, calor  o dolor alrededor del lugar de la insercin de la va intravenosa. Observa sangre que sale del lugar de la insercin de la va intravenosa que no se detiene al Occupational hygienist presin. Tiene pus o percibe mal olor que proviene del lugar de la insercin de la va intravenosa. Si recibi la transfusin de sangre en un entorno para pacientes ambulatorios, le indicarn con quin debe ponerse en contacto para informar cualquier reaccin. Solicite ayuda de inmediato si: Tiene signos de Runner, broadcasting/film/video grave o de una reaccin del sistema inmunitario, por ejemplo: Dificultad para respirar o falta de aire. Hinchazn de la cara, sensacin de sofoco o erupcin cutnea generalizada. Orina de color oscuro o sangre en la orina. Latidos cardacos acelerados. Estos sntomas pueden Customer service manager. Solicite ayuda de inmediato. Llame al 911. No espere a ver si los sntomas desaparecen. No conduzca por sus propios medios [employer] hospital. Resumen Los moretones y Chief Technology Officer alrededor del lugar de la insercin de la va intravenosa (IV) son frecuentes. Controle Immunologist de insercin de la va intravenosa (IV) todos los das para descartar signos de infeccin. Haga reposo como se lo haya indicado el mdico. Retome sus actividades normales como se lo haya indicado el mdico. Busque ayuda de inmediato si tiene sntomas de una reaccin alrgica grave o de una reaccin del sistema inmunitario a una transfusin de [place]. Esta informacin no tiene Theme park manager el consejo del mdico. Asegrese de hacerle al mdico cualquier pregunta que tenga. Document Revised: 05/06/2021 Document Reviewed: 05/06/2021 Elsevier Patient Education  2024 Elsevier Inc.  Transfusin de plaquetas Platelet Transfusion La transfusin de plaquetas es un procedimiento en el cual una persona recibe plaquetas de un donante a travs de [place]  va intravenosa. Las plaquetas son fragmentos de la sangre que se unen y forman un cogulo para  ayudar a que el cuerpo deje de Geophysicist/field seismologist despus de una lesin. Si tiene [employer], es posible que su sangre tenga problemas para Biochemist, clinical. Esto puede causar sangrado y moretones con mucha facilidad. Tal vez deba recibir una transfusin de plaquetas si tiene una enfermedad que reduce el nmero de plaquetas (trombocitopenia). Se puede usar una transfusin de plaquetas para detener o evitar el sangrado excesivo. Informe al mdico acerca de lo siguiente: Las reacciones que haya tenido durante transfusiones previas. Cualquier alergia que tenga. Todos los [employer] [place], incluidos vitaminas, hierbas, gotas oftlmicas, cremas y [address] de [address]. Cualquier problema de la sangre que tenga. Cirugas a las que se haya sometido. Cualquier afeccin mdica que tenga. Si est embarazada o podra estarlo. Cules son los riesgos? En general, se trata de un procedimiento seguro. No obstante, pueden ocurrir complicaciones, por ejemplo: Grant Ruts. Infeccin. Reaccin alrgica a las plaquetas donadas (de un donante). Que el sistema de su cuerpo que combate las enfermedades (sistema inmunitario) ataque las plaquetas del donante (reaccin hemoltica). Esto es poco frecuente. Una reaccin poco frecuente que causa dao pulmonar (lesin pulmonar aguda producida por transfusin). Qu ocurre antes del procedimiento? Medicamentos Consulte al mdico si debe hacer o no lo siguiente: Multimedia programmer o suspender los medicamentos que [place] habitualmente. Esto es muy importante si toma medicamentos para la diabetes o anticoagulantes. Tomar medicamentos como aspirina e ibuprofeno. Estos medicamentos pueden tener un efecto anticoagulante en la [place]. No tome estos medicamentos a menos que el mdico se lo indique. Usar medicamentos de venta libre, vitaminas, hierbas y suplementos. Indicaciones generales Se le realizar un anlisis de sangre para determinar su grupo sanguneo. Su tipo de sangre determina qu tipo  de plaquetas recibir. Siga las instrucciones del mdico respecto de las restricciones en las comidas o las bebidas. Si tuvo una reaccin alrgica a una transfusin en el pasado, tal vez le administren un medicamento que ayude a evitarla. Le controlarn la temperatura, la presin arterial, el pulso y la respiracin. Qu ocurre durante el procedimiento?  Le colocarn una va intravenosa (i.v.) en una vena. Por su seguridad, dos mdicos verificarn su identidad y las plaquetas del donante que se le van a infundir. Una bolsa con las plaquetas del donante se conectar a su va intravenosa. Las plaquetas ingresarn en su torrente sanguneo. Esto generalmente demora entre 30 y 60 minutos. Durante la transfusin, le controlarn la temperatura, la presin arterial, el pulso y la respiracin. Esto ayuda a detectar signos tempranos de una reaccin. Tambin lo controlarn para Engineer, manufacturing otros sntomas que puedan indicar una reaccin, como escalofros, urticaria o picazn. Si tiene signos de Astronomer, se suspender la transfusin y tal vez le administren un medicamento para Animator. Una vez finalizada la transfusin, se retirar la va intravenosa. Se puede aplicar presin en el lugar de la va intravenosa (i.v.) para detener cualquier sangrado. El lugar de la va intravenosa (i.v.) se cubrir con un vendaje (venda). El procedimiento puede variar segn el mdico y el hospital. Qu puedo esperar despus del procedimiento? Le controlarn la presin arterial, la temperatura, el pulso y la respiracin hasta que abandone el hospital o la clnica. Es posible que tenga algunos moretones y Stage manager en el lugar de la va intravenosa (i.v.). Siga estas indicaciones en su casa: Medicamentos Use los medicamentos de venta libre y los recetados solamente como se lo haya indicado  el mdico. Hable con el mdico antes de tomar cualquier medicamento que contenga aspirina o  antiinflamatorios no esteroideos (AINE), como el ibuprofeno. Estos medicamentos aumentan el riesgo de tener una hemorragia peligrosa. Cuidado del lugar de la va intravenosa (i.v.) Controle el lugar de la va intravenosa (i.v.) [employer] para descartar signos de infeccin. Est atento a los siguientes signos: Enrojecimiento, Optician, dispensing. Lquido o sangre. Si sale lquido o sangre del Environmental consultant de la va intravenosa (i.v.), ejerza presin firme con las manos sobre el vendaje que cubre la zona durante uno o dos minutos. Al hacerlo, debera detenerse el sangrado. Calor. Pus o mal olor. Indicaciones generales [place] o retire Secondary school teacher se lo haya indicado el mdico. Retome sus actividades normales como se lo haya indicado el mdico. Pregntele al mdico qu actividades son seguras para usted. No tome baos de inmersin, no nade ni use el jacuzzi hasta que el mdico lo autorice. Pregntele al mdico si puede ducharse. Concurra a todas las visitas de seguimiento. Esto es importante. Comunquese con un mdico si: Siente un dolor de cabeza que no se alivia con medicamentos. Tiene ronchas, una erupcin cutnea o picazn en la piel. Tiene nuseas o vmitos. Se siente cansado o dbil de un modo que no es habitual. Tiene signos de infeccin en el lugar de la va intravenosa (i.v.). Solicite ayuda de inmediato si: Tiene fiebre o escalofros. Orina con menos frecuencia que lo habitual. La orina es de un color ms oscuro que lo normal. Tiene alguno de los siguientes sntomas: Dificultad para Industrial/product designer. Dolor en la espalda, el abdomen o el pecho. Piel fra y hmeda. Latidos cardacos acelerados. Resumen Las plaquetas son fragmentos diminutos de clulas sanguneas que se agrupan para formar un cogulo de sangre cuando se produce una herida. Si tiene [employer], es posible que su sangre tenga problemas para Biochemist, clinical. La transfusin de plaquetas es un procedimiento en el cual se reciben  plaquetas de un donante a travs de una va intravenosa. Se puede usar una transfusin de plaquetas para detener o evitar el sangrado excesivo. Despus del procedimiento, controle el lugar de la va intravenosa (i.v.) todos los das para descartar signos de infeccin. Esta informacin no tiene Theme park manager el consejo del mdico. Asegrese de hacerle al mdico cualquier pregunta que tenga. Document Revised: 08/12/2020 Document Reviewed: 08/12/2020 Elsevier Patient Education  2024 [employer].

## 2022-07-26 NOTE — Progress Notes (Signed)
Approximately 15 minutes post Platelet transfusion initaition, VS obtained and Temperature increased from 98.48F to 100.60F. Platelets stopped, IVF to gravity initiated. Pt denies adverse symptoms. Endorses generalized fatigue not changed from earlier assessment. Bertis Ruddy, MD, contacted, who advised 40mg  Solumedrol and waiting 30 minutes. VS on recheck post 30 min observation period obtained. Temperature 99.48F. BP consistent with previous. Per Bertis Ruddy, okay to restart platelets with temp less than 100F. Okay to proceed with PRBC's post platelet completion with temp maintained less than 100F. Pt educated on S/S transfusion reaction and verbalized understanding.

## 2022-07-27 LAB — TYPE AND SCREEN
Antibody Screen: NEGATIVE
Unit division: 0
Unit division: 0

## 2022-07-27 LAB — PREPARE PLATELET PHERESIS: Unit division: 0

## 2022-07-27 LAB — BPAM PLATELET PHERESIS
Blood Product Expiration Date: 202407022359
ISSUE DATE / TIME: 202407020934
Unit Type and Rh: 8400

## 2022-07-27 LAB — BPAM RBC
Blood Product Expiration Date: 202407252359
Blood Product Expiration Date: 202407272359
ISSUE DATE / TIME: 202407021058
Unit Type and Rh: 5100

## 2022-07-29 ENCOUNTER — Other Ambulatory Visit: Payer: Medicare Other

## 2022-07-29 ENCOUNTER — Inpatient Hospital Stay (HOSPITAL_COMMUNITY)
Admission: EM | Admit: 2022-07-29 | Discharge: 2022-08-15 | DRG: 808 | Disposition: A | Discharge status: Home / Self Care | Payer: Medicare Other | Attending: Internal Medicine | Admitting: Internal Medicine

## 2022-07-29 ENCOUNTER — Encounter: Payer: Self-pay | Admitting: Hematology

## 2022-07-29 ENCOUNTER — Inpatient Hospital Stay (HOSPITAL_BASED_OUTPATIENT_CLINIC_OR_DEPARTMENT_OTHER): Payer: Medicare Other | Admitting: Hematology

## 2022-07-29 ENCOUNTER — Encounter (HOSPITAL_COMMUNITY): Payer: Self-pay

## 2022-07-29 ENCOUNTER — Other Ambulatory Visit: Payer: Self-pay

## 2022-07-29 ENCOUNTER — Inpatient Hospital Stay: Payer: Medicare Other

## 2022-07-29 ENCOUNTER — Emergency Department (HOSPITAL_COMMUNITY): Payer: Medicare Other

## 2022-07-29 VITALS — BP 102/44 | HR 68 | Temp 98.6°F | Resp 16

## 2022-07-29 DIAGNOSIS — I10 Essential (primary) hypertension: Secondary | ICD-10-CM | POA: Diagnosis not present

## 2022-07-29 DIAGNOSIS — J329 Chronic sinusitis, unspecified: Secondary | ICD-10-CM | POA: Diagnosis not present

## 2022-07-29 DIAGNOSIS — D46Z Other myelodysplastic syndromes: Secondary | ICD-10-CM

## 2022-07-29 DIAGNOSIS — R9389 Abnormal findings on diagnostic imaging of other specified body structures: Secondary | ICD-10-CM | POA: Diagnosis not present

## 2022-07-29 DIAGNOSIS — G9341 Metabolic encephalopathy: Secondary | ICD-10-CM | POA: Diagnosis not present

## 2022-07-29 DIAGNOSIS — D696 Thrombocytopenia, unspecified: Secondary | ICD-10-CM | POA: Diagnosis not present

## 2022-07-29 DIAGNOSIS — R5081 Fever presenting with conditions classified elsewhere: Secondary | ICD-10-CM | POA: Diagnosis not present

## 2022-07-29 DIAGNOSIS — Z711 Person with feared health complaint in whom no diagnosis is made: Secondary | ICD-10-CM | POA: Diagnosis not present

## 2022-07-29 DIAGNOSIS — D72819 Decreased white blood cell count, unspecified: Secondary | ICD-10-CM | POA: Diagnosis not present

## 2022-07-29 DIAGNOSIS — R509 Fever, unspecified: Secondary | ICD-10-CM | POA: Diagnosis not present

## 2022-07-29 DIAGNOSIS — F32A Depression, unspecified: Secondary | ICD-10-CM | POA: Diagnosis present

## 2022-07-29 DIAGNOSIS — C50212 Malignant neoplasm of upper-inner quadrant of left female breast: Secondary | ICD-10-CM

## 2022-07-29 DIAGNOSIS — E872 Acidosis, unspecified: Secondary | ICD-10-CM | POA: Diagnosis present

## 2022-07-29 DIAGNOSIS — R4589 Other symptoms and signs involving emotional state: Secondary | ICD-10-CM

## 2022-07-29 DIAGNOSIS — R6520 Severe sepsis without septic shock: Secondary | ICD-10-CM | POA: Diagnosis not present

## 2022-07-29 DIAGNOSIS — H05011 Cellulitis of right orbit: Secondary | ICD-10-CM | POA: Diagnosis present

## 2022-07-29 DIAGNOSIS — Z170 Estrogen receptor positive status [ER+]: Secondary | ICD-10-CM

## 2022-07-29 DIAGNOSIS — Z515 Encounter for palliative care: Secondary | ICD-10-CM

## 2022-07-29 DIAGNOSIS — J188 Other pneumonia, unspecified organism: Secondary | ICD-10-CM | POA: Diagnosis present

## 2022-07-29 DIAGNOSIS — B9620 Unspecified Escherichia coli [E. coli] as the cause of diseases classified elsewhere: Secondary | ICD-10-CM | POA: Diagnosis not present

## 2022-07-29 DIAGNOSIS — D709 Neutropenia, unspecified: Secondary | ICD-10-CM | POA: Diagnosis not present

## 2022-07-29 DIAGNOSIS — J189 Pneumonia, unspecified organism: Secondary | ICD-10-CM | POA: Diagnosis not present

## 2022-07-29 DIAGNOSIS — B999 Unspecified infectious disease: Secondary | ICD-10-CM

## 2022-07-29 DIAGNOSIS — R7881 Bacteremia: Secondary | ICD-10-CM

## 2022-07-29 DIAGNOSIS — A419 Sepsis, unspecified organism: Secondary | ICD-10-CM | POA: Diagnosis not present

## 2022-07-29 DIAGNOSIS — Z1152 Encounter for screening for COVID-19: Secondary | ICD-10-CM | POA: Diagnosis not present

## 2022-07-29 DIAGNOSIS — E871 Hypo-osmolality and hyponatremia: Secondary | ICD-10-CM | POA: Diagnosis present

## 2022-07-29 DIAGNOSIS — R599 Enlarged lymph nodes, unspecified: Secondary | ICD-10-CM | POA: Diagnosis not present

## 2022-07-29 DIAGNOSIS — Z853 Personal history of malignant neoplasm of breast: Secondary | ICD-10-CM | POA: Diagnosis not present

## 2022-07-29 DIAGNOSIS — Z7189 Other specified counseling: Secondary | ICD-10-CM

## 2022-07-29 DIAGNOSIS — H4030X0 Glaucoma secondary to eye trauma, unspecified eye, stage unspecified: Secondary | ICD-10-CM | POA: Diagnosis not present

## 2022-07-29 DIAGNOSIS — R9431 Abnormal electrocardiogram [ECG] [EKG]: Secondary | ICD-10-CM | POA: Diagnosis not present

## 2022-07-29 DIAGNOSIS — J90 Pleural effusion, not elsewhere classified: Secondary | ICD-10-CM

## 2022-07-29 DIAGNOSIS — D61818 Other pancytopenia: Secondary | ICD-10-CM | POA: Diagnosis not present

## 2022-07-29 DIAGNOSIS — Z66 Do not resuscitate: Secondary | ICD-10-CM

## 2022-07-29 DIAGNOSIS — J918 Pleural effusion in other conditions classified elsewhere: Secondary | ICD-10-CM | POA: Diagnosis present

## 2022-07-29 DIAGNOSIS — D849 Immunodeficiency, unspecified: Secondary | ICD-10-CM | POA: Diagnosis not present

## 2022-07-29 DIAGNOSIS — D649 Anemia, unspecified: Secondary | ICD-10-CM

## 2022-07-29 DIAGNOSIS — I959 Hypotension, unspecified: Secondary | ICD-10-CM | POA: Diagnosis not present

## 2022-07-29 DIAGNOSIS — D469 Myelodysplastic syndrome, unspecified: Secondary | ICD-10-CM | POA: Diagnosis present

## 2022-07-29 DIAGNOSIS — N390 Urinary tract infection, site not specified: Secondary | ICD-10-CM | POA: Diagnosis present

## 2022-07-29 DIAGNOSIS — Z794 Long term (current) use of insulin: Secondary | ICD-10-CM | POA: Diagnosis not present

## 2022-07-29 DIAGNOSIS — E119 Type 2 diabetes mellitus without complications: Secondary | ICD-10-CM | POA: Diagnosis not present

## 2022-07-29 DIAGNOSIS — R008 Other abnormalities of heart beat: Secondary | ICD-10-CM | POA: Diagnosis not present

## 2022-07-29 DIAGNOSIS — E1165 Type 2 diabetes mellitus with hyperglycemia: Secondary | ICD-10-CM | POA: Diagnosis present

## 2022-07-29 DIAGNOSIS — J9601 Acute respiratory failure with hypoxia: Secondary | ICD-10-CM | POA: Diagnosis not present

## 2022-07-29 LAB — CBC WITH DIFFERENTIAL/PLATELET
Abs Immature Granulocytes: 0 10*3/uL (ref 0.00–0.07)
Band Neutrophils: 0 %
Basophils Absolute: 0 10*3/uL (ref 0.0–0.1)
Basophils Relative: 0 %
Blasts: 0 %
Eosinophils Absolute: 0 10*3/uL (ref 0.0–0.5)
Eosinophils Relative: 0 %
HCT: 24.9 % — ABNORMAL LOW (ref 36.0–46.0)
Hemoglobin: 8.4 g/dL — ABNORMAL LOW (ref 12.0–15.0)
Lymphocytes Relative: 92 %
Lymphs Abs: 1.2 10*3/uL (ref 0.7–4.0)
MCH: 28.5 pg (ref 26.0–34.0)
MCHC: 33.7 g/dL (ref 30.0–36.0)
MCV: 84.4 fL (ref 80.0–100.0)
Metamyelocytes Relative: 0 %
Monocytes Absolute: 0 10*3/uL — ABNORMAL LOW (ref 0.1–1.0)
Monocytes Relative: 2 %
Myelocytes: 0 %
Neutro Abs: 0.1 10*3/uL — CL (ref 1.7–7.7)
Neutrophils Relative %: 5 %
Other: 1 %
Platelets: 22 10*3/uL — CL (ref 150–400)
Promyelocytes Relative: 0 %
RBC: 2.95 MIL/uL — ABNORMAL LOW (ref 3.87–5.11)
RDW: 13.9 % (ref 11.5–15.5)
WBC: 1.3 10*3/uL — CL (ref 4.0–10.5)
nRBC: 0 /100 WBC
nRBC: 1.5 % — ABNORMAL HIGH (ref 0.0–0.2)

## 2022-07-29 LAB — CBC WITH DIFFERENTIAL (CANCER CENTER ONLY)
Abs Immature Granulocytes: 0.01 10*3/uL (ref 0.00–0.07)
Basophils Absolute: 0 10*3/uL (ref 0.0–0.1)
Basophils Relative: 1 %
Eosinophils Absolute: 0 10*3/uL (ref 0.0–0.5)
Eosinophils Relative: 0 %
HCT: 28.8 % — ABNORMAL LOW (ref 36.0–46.0)
Hemoglobin: 9.8 g/dL — ABNORMAL LOW (ref 12.0–15.0)
Immature Granulocytes: 1 %
Lymphocytes Relative: 81 %
Lymphs Abs: 1.2 10*3/uL (ref 0.7–4.0)
MCH: 28.4 pg (ref 26.0–34.0)
MCHC: 34 g/dL (ref 30.0–36.0)
MCV: 83.5 fL (ref 80.0–100.0)
Monocytes Absolute: 0.1 10*3/uL (ref 0.1–1.0)
Monocytes Relative: 10 %
Neutro Abs: 0.1 10*3/uL — CL (ref 1.7–7.7)
Neutrophils Relative %: 7 %
Platelet Count: 14 10*3/uL — ABNORMAL LOW (ref 150–400)
RBC: 3.45 MIL/uL — ABNORMAL LOW (ref 3.87–5.11)
RDW: 13.8 % (ref 11.5–15.5)
Smear Review: NORMAL
WBC Count: 1.5 10*3/uL — ABNORMAL LOW (ref 4.0–10.5)
nRBC: 2 % — ABNORMAL HIGH (ref 0.0–0.2)

## 2022-07-29 LAB — COMPREHENSIVE METABOLIC PANEL
ALT: 12 U/L (ref 0–44)
AST: 13 U/L — ABNORMAL LOW (ref 15–41)
Albumin: 3 g/dL — ABNORMAL LOW (ref 3.5–5.0)
Alkaline Phosphatase: 61 U/L (ref 38–126)
Anion gap: 7 (ref 5–15)
BUN: 17 mg/dL (ref 8–23)
CO2: 24 mmol/L (ref 22–32)
Calcium: 8.2 mg/dL — ABNORMAL LOW (ref 8.9–10.3)
Chloride: 103 mmol/L (ref 98–111)
Creatinine, Ser: 0.58 mg/dL (ref 0.44–1.00)
GFR, Estimated: >60 mL/min (ref >60)
Glucose, Bld: 205 mg/dL — ABNORMAL HIGH (ref 70–99)
Potassium: 3.2 mmol/L — ABNORMAL LOW (ref 3.5–5.1)
Sodium: 134 mmol/L — ABNORMAL LOW (ref 135–145)
Total Bilirubin: 0.5 mg/dL (ref 0.3–1.2)
Total Protein: 7.8 g/dL (ref 6.5–8.1)

## 2022-07-29 LAB — URINALYSIS, ROUTINE W REFLEX MICROSCOPIC
Bacteria, UA: NONE SEEN
Bilirubin Urine: NEGATIVE
Glucose, UA: >=500 mg/dL — AB
Ketones, ur: NEGATIVE mg/dL
Leukocytes,Ua: NEGATIVE
Nitrite: NEGATIVE
Protein, ur: 30 mg/dL — AB
Specific Gravity, Urine: 1.021 (ref 1.005–1.030)
pH: 5 (ref 5.0–8.0)

## 2022-07-29 LAB — RESP PANEL BY RT-PCR (RSV, FLU A&B, COVID)  RVPGX2
Influenza A by PCR: NEGATIVE
Influenza B by PCR: NEGATIVE
Resp Syncytial Virus by PCR: NEGATIVE
SARS Coronavirus 2 by RT PCR: NEGATIVE

## 2022-07-29 LAB — MAGNESIUM: Magnesium: 1.5 mg/dL — ABNORMAL LOW (ref 1.7–2.4)

## 2022-07-29 LAB — GLUCOSE, CAPILLARY: Glucose-Capillary: 211 mg/dL — ABNORMAL HIGH (ref 70–99)

## 2022-07-29 LAB — SAMPLE TO BLOOD BANK

## 2022-07-29 LAB — GROUP A STREP BY PCR: Group A Strep by PCR: NOT DETECTED

## 2022-07-29 LAB — CBG MONITORING, ED: Glucose-Capillary: 162 mg/dL — ABNORMAL HIGH (ref 70–99)

## 2022-07-29 LAB — BPAM PLATELET PHERESIS
ISSUE DATE / TIME: 202407051053
Unit Type and Rh: 5100

## 2022-07-29 LAB — LIPASE, BLOOD: Lipase: 29 U/L (ref 11–51)

## 2022-07-29 MED ORDER — SODIUM CHLORIDE 0.9% IV SOLUTION
250.0000 mL | Freq: Once | INTRAVENOUS | Status: AC
  Administered 2022-07-29: 250 mL via INTRAVENOUS

## 2022-07-29 MED ORDER — PANTOPRAZOLE SODIUM 40 MG PO TBEC
40.0000 mg | DELAYED_RELEASE_TABLET | Freq: Every day | ORAL | Status: DC
  Administered 2022-07-30 – 2022-08-08 (×9): 40 mg via ORAL
  Filled 2022-07-29 (×9): qty 1

## 2022-07-29 MED ORDER — SODIUM CHLORIDE 0.9 % IV SOLN
2.0000 g | Freq: Two times a day (BID) | INTRAVENOUS | Status: AC
  Administered 2022-07-30 – 2022-08-01 (×6): 2 g via INTRAVENOUS
  Filled 2022-07-29 (×6): qty 12.5

## 2022-07-29 MED ORDER — INSULIN GLARGINE-YFGN 100 UNIT/ML ~~LOC~~ SOLN
20.0000 [IU] | Freq: Two times a day (BID) | SUBCUTANEOUS | Status: DC
  Administered 2022-07-29 – 2022-08-04 (×12): 20 [IU] via SUBCUTANEOUS
  Filled 2022-07-29 (×13): qty 0.2

## 2022-07-29 MED ORDER — ACETAMINOPHEN 325 MG PO TABS
650.0000 mg | ORAL_TABLET | Freq: Four times a day (QID) | ORAL | Status: DC | PRN
  Administered 2022-07-29 – 2022-08-04 (×12): 650 mg via ORAL
  Filled 2022-07-29: qty 2

## 2022-07-29 MED ORDER — DIPHENHYDRAMINE HCL 25 MG PO CAPS
25.0000 mg | ORAL_CAPSULE | Freq: Once | ORAL | Status: AC
  Administered 2022-07-29: 25 mg via ORAL
  Filled 2022-07-29: qty 1

## 2022-07-29 MED ORDER — INSULIN ASPART 100 UNIT/ML IJ SOLN
0.0000 [IU] | Freq: Three times a day (TID) | INTRAMUSCULAR | Status: DC
  Administered 2022-07-29 – 2022-07-30 (×2): 3 [IU] via SUBCUTANEOUS
  Administered 2022-07-30: 2 [IU] via SUBCUTANEOUS
  Administered 2022-07-31: 3 [IU] via SUBCUTANEOUS
  Administered 2022-08-01: 11 [IU] via SUBCUTANEOUS
  Administered 2022-08-01: 3 [IU] via SUBCUTANEOUS
  Administered 2022-08-02: 5 [IU] via SUBCUTANEOUS
  Administered 2022-08-02: 3 [IU] via SUBCUTANEOUS
  Administered 2022-08-02: 8 [IU] via SUBCUTANEOUS
  Administered 2022-08-03: 5 [IU] via SUBCUTANEOUS
  Administered 2022-08-03: 8 [IU] via SUBCUTANEOUS
  Administered 2022-08-03: 3 [IU] via SUBCUTANEOUS
  Administered 2022-08-04: 2 [IU] via SUBCUTANEOUS
  Administered 2022-08-04: 3 [IU] via SUBCUTANEOUS
  Administered 2022-08-05: 2 [IU] via SUBCUTANEOUS
  Administered 2022-08-05: 3 [IU] via SUBCUTANEOUS
  Administered 2022-08-06 (×2): 11 [IU] via SUBCUTANEOUS
  Filled 2022-07-29: qty 0.15

## 2022-07-29 MED ORDER — ACETAMINOPHEN 325 MG PO TABS
650.0000 mg | ORAL_TABLET | Freq: Once | ORAL | Status: AC
  Administered 2022-07-29: 650 mg via ORAL
  Filled 2022-07-29: qty 2

## 2022-07-29 MED ORDER — OXYCODONE HCL 5 MG PO TABS
2.5000 mg | ORAL_TABLET | ORAL | Status: DC | PRN
  Administered 2022-08-13: 5 mg via ORAL
  Filled 2022-07-29: qty 1

## 2022-07-29 MED ORDER — ALBUTEROL SULFATE (2.5 MG/3ML) 0.083% IN NEBU
2.5000 mg | INHALATION_SOLUTION | RESPIRATORY_TRACT | Status: DC | PRN
  Administered 2022-08-07 (×2): 2.5 mg via RESPIRATORY_TRACT
  Filled 2022-07-29 (×2): qty 3

## 2022-07-29 MED ORDER — INSULIN ASPART 100 UNIT/ML IJ SOLN
0.0000 [IU] | Freq: Every day | INTRAMUSCULAR | Status: DC
  Administered 2022-07-29 – 2022-08-01 (×2): 2 [IU] via SUBCUTANEOUS
  Administered 2022-08-04 – 2022-08-05 (×2): 3 [IU] via SUBCUTANEOUS
  Filled 2022-07-29: qty 0.05

## 2022-07-29 MED ORDER — POTASSIUM CHLORIDE CRYS ER 20 MEQ PO TBCR
40.0000 meq | EXTENDED_RELEASE_TABLET | Freq: Once | ORAL | Status: AC
  Administered 2022-07-29: 40 meq via ORAL
  Filled 2022-07-29: qty 2

## 2022-07-29 MED ORDER — TRAZODONE HCL 50 MG PO TABS
25.0000 mg | ORAL_TABLET | Freq: Every evening | ORAL | Status: DC | PRN
  Administered 2022-08-13: 25 mg via ORAL
  Filled 2022-07-29: qty 1

## 2022-07-29 MED ORDER — ACETAMINOPHEN 325 MG PO TABS
650.0000 mg | ORAL_TABLET | ORAL | Status: DC | PRN
  Administered 2022-08-03: 650 mg via ORAL
  Filled 2022-07-29 (×12): qty 2

## 2022-07-29 MED ORDER — VANCOMYCIN HCL IN DEXTROSE 1-5 GM/200ML-% IV SOLN
1000.0000 mg | Freq: Once | INTRAVENOUS | Status: AC
  Administered 2022-07-29: 1000 mg via INTRAVENOUS
  Filled 2022-07-29: qty 200

## 2022-07-29 MED ORDER — CITALOPRAM HYDROBROMIDE 20 MG PO TABS
20.0000 mg | ORAL_TABLET | Freq: Every day | ORAL | Status: DC
  Administered 2022-07-29 – 2022-08-10 (×12): 20 mg via ORAL
  Filled 2022-07-29 (×12): qty 1

## 2022-07-29 MED ORDER — SODIUM CHLORIDE 0.9 % IV SOLN
2.0000 g | Freq: Once | INTRAVENOUS | Status: AC
  Administered 2022-07-29: 2 g via INTRAVENOUS
  Filled 2022-07-29: qty 12.5

## 2022-07-29 MED ORDER — ONDANSETRON HCL 4 MG/2ML IJ SOLN: 4.0000 mg | Freq: Four times a day (QID) | INTRAMUSCULAR | Status: DC | PRN

## 2022-07-29 MED ORDER — LOSARTAN POTASSIUM 50 MG PO TABS
50.0000 mg | ORAL_TABLET | Freq: Every day | ORAL | Status: DC
  Administered 2022-07-29 – 2022-08-04 (×7): 50 mg via ORAL
  Filled 2022-07-29 (×7): qty 1

## 2022-07-29 MED ORDER — BASAGLAR KWIKPEN 100 UNIT/ML ~~LOC~~ SOPN: 20.0000 [IU] | PEN_INJECTOR | Freq: Two times a day (BID) | SUBCUTANEOUS | Status: DC

## 2022-07-29 MED ORDER — ONDANSETRON HCL 4 MG PO TABS: 4.0000 mg | ORAL_TABLET | Freq: Four times a day (QID) | ORAL | Status: DC | PRN

## 2022-07-29 MED ORDER — VANCOMYCIN HCL 750 MG/150ML IV SOLN
750.0000 mg | INTRAVENOUS | Status: DC
  Administered 2022-07-30: 750 mg via INTRAVENOUS
  Filled 2022-07-29: qty 150

## 2022-07-29 MED ORDER — ACETAMINOPHEN 650 MG RE SUPP: 650.0000 mg | Freq: Four times a day (QID) | RECTAL | Status: DC | PRN

## 2022-07-29 NOTE — Patient Instructions (Signed)
Platelet Transfusion A platelet transfusion is a procedure in which a person receives donated platelets through an IV. Platelets are parts of blood that stick together and form a clot to help the body stop bleeding after an injury. If you have too few platelets, your blood may have trouble clotting. This may cause you to bleed and bruise very easily. You may need a platelet transfusion if you have a condition that causes a low number of platelets (thrombocytopenia). A platelet transfusion may be used to stop or prevent excessive bleeding. Tell a health care provider about: Any reactions you have had during previous transfusions. Any allergies you have. All medicines you are taking, including vitamins, herbs, eye drops, creams, and over-the-counter medicines. Any bleeding problems you have. Any surgeries you have had. Any medical conditions you have. Whether you are pregnant or may be pregnant. What are the risks? Generally, this is a safe procedure. However, problems may occur, including: Fever. Infection. Allergic reaction to the donated (donor) platelets. Your body's disease-fighting system (immune system) attacking the donor platelets (hemolytic reaction). This is rare. A rare reaction that causes lung damage (transfusion-related acute lung injury). What happens before the procedure? Medicines Ask your health care provider about: Changing or stopping your regular medicines. This is especially important if you are taking diabetes medicines or blood thinners. Taking medicines such as aspirin and ibuprofen. These medicines can thin your blood. Do not take these medicines unless your health care provider tells you to take them. Taking over-the-counter medicines, vitamins, herbs, and supplements. General instructions You will have a blood test to determine your blood type. Your blood type determines what kind of platelets you will be given. Follow instructions from your health care provider  about eating or drinking restrictions. If you have had an allergic reaction to a transfusion in the past, you may be given medicine to help prevent a reaction. Your temperature, blood pressure, pulse, and breathing will be monitored. What happens during the procedure?  An IV will be inserted into one of your veins. For your safety, two health care providers will verify your identity along with the donor platelets about to be infused. A bag of donor platelets will be connected to your IV. The platelets will flow into your bloodstream. This usually takes 30-60 minutes. Your temperature, blood pressure, pulse, and breathing will be monitored during the transfusion. This helps detect early signs of any reaction. You will also be monitored for other symptoms that may indicate a reaction, including chills, hives, or itching. If you have signs of a reaction at any time, your transfusion will be stopped, and you may be given medicine to help manage the reaction. When your transfusion is complete, your IV will be removed. Pressure may be applied to the IV site for a few minutes to stop any bleeding. The IV site will be covered with a bandage (dressing). The procedure may vary among health care providers and hospitals. What can I expect after the procedure? Your blood pressure, temperature, pulse, and breathing will be monitored until you leave the hospital or clinic. You may have some bruising and soreness at your IV site. Follow these instructions at home: Medicines Take over-the-counter and prescription medicines only as told by your health care provider. Talk with your health care provider before you take any medicines that contain aspirin or NSAIDs, such as ibuprofen. These medicines increase your risk for dangerous bleeding. IV site care Check your IV site every day for signs of infection. Check for:   Redness, swelling, or pain. Fluid or blood. If fluid or blood drains from your IV site, use your  hands to press down firmly on a bandage covering the area for a minute or two. Doing this should stop the bleeding. Warmth. Pus or a bad smell. General instructions Change or remove your dressing as told by your health care provider. Return to your normal activities as told by your health care provider. Ask your health care provider what activities are safe for you. Do not take baths, swim, or use a hot tub until your health care provider approves. Ask your health care provider if you may take showers. Keep all follow-up visits. This is important. Contact a health care provider if: You have a headache that does not go away with medicine. You have hives, rash, or itchy skin. You have nausea or vomiting. You feel unusually tired or weak. You have signs of infection at your IV site. Get help right away if: You have a fever or chills. You urinate less often than usual. Your urine is darker colored than normal. You have any of the following: Trouble breathing. Pain in your back, abdomen, or chest. Cool, clammy skin. A fast heartbeat. Summary Platelets are tiny pieces of blood cells that clump together to form a blood clot when you have an injury. If you have too few platelets, your blood may have trouble clotting. A platelet transfusion is a procedure in which you receive donated platelets through an IV. A platelet transfusion may be used to stop or prevent excessive bleeding. After the procedure, check your IV site every day for signs of infection. This information is not intended to replace advice given to you by your health care provider. Make sure you discuss any questions you have with your health care provider. Document Revised: 07/16/2020 Document Reviewed: 07/16/2020 Elsevier Patient Education  2024 [employer]  

## 2022-07-29 NOTE — H&P (Signed)
History and Physical  [name] MVH:[id number] DOB: January 17, 1956 DOA: 07/29/2022  PCP: Claiborne Rigg, NP   Chief Complaint: fever   HPI: Amanda Duarte is a 67 y.o. female with medical history significant for treated breast cancer, insulin-dependent type 2 diabetes, myelodysplastic syndrome being admitted to the hospital with neutropenic fever.  She presented to oncology clinic with Dr. Mosetta Putt for routine follow-up, complaint of sore fever but no other symptoms, is currently on azacitadine injection.  Tells me that she was feeling fine until last night, when she had a little bit of a sore fever, and overnight she also felt like she was febrile.  Today in the oncology clinic she was found to have fever 100.4.  Lab work was obtained, showed platelets 14,000 was given 1 unit platelet transfusion in the oncology office and then sent to [facility] ED for evaluation.  ED Course: Emergency department, she has been afebrile, with blood pressure as low as 101/50.  Has been saturating well on room air.  Lab work was repeated, shows WBC 1.3, hemoglobin 8.4, platelets 22, sodium 134, potassium 3.2, normal renal function and unremarkable LFTs.  Chest x-ray with mild peribronchial thickening, UA without evidence of infection.  Blood and urine cultures were obtained, she was started empirically on IV vancomycin and IV cefepime.  Review of Systems: Please see HPI for pertinent positives and negatives. A complete 10 system review of systems are otherwise negative.  Past Medical History:  Diagnosis Date   Cancer (HCC) 03/2017   left breast cancer   Diabetes mellitus without complication (HCC)    Hyperlipidemia    Hypertension    Malignant neoplasm of left breast (HCC)    Stroke (cerebrum) (HCC)    Tibial plateau fracture, left    04-13-17 had ORIF   Past Surgical History:  Procedure Laterality Date   MASTECTOMY Left 2019   MASTECTOMY MODIFIED RADICAL Left 11/20/2017   Procedure: LEFT MODIFIED RADICAL MASTECTOMY;   Surgeon: Claud Kelp, MD;  Location: [facility] OR;  Service: General;  Laterality: Left;   ORIF TIBIA PLATEAU Left 04/13/2017   Procedure: OPEN REDUCTION INTERNAL FIXATION (ORIF) TIBIAL PLATEAU;  Surgeon: Roby Lofts, MD;  Location: [facility] OR;  Service: Orthopedics;  Laterality: Left;   PORT-A-CATH REMOVAL Right 11/20/2017   Procedure: REMOVAL PORT-A-CATH;  Surgeon: Claud Kelp, MD;  Location: [facility] OR;  Service: General;  Laterality: Right;   PORTACATH PLACEMENT Right 05/31/2017   Procedure: INSERTION PORT-A-CATH;  Surgeon: Claud Kelp, MD;  Location: [facility];  Service: General;  Laterality: Right;    Social History:  reports that she has never smoked. She has never used smokeless tobacco. She reports that she does not drink alcohol and does not use drugs.   No Known Allergies  Family History  Problem Relation Age of Onset   Heart Problems Mother    Diabetes Son    Breast cancer Neg Hx    Colon cancer Neg Hx    Rectal cancer Neg Hx    Stomach cancer Neg Hx    Esophageal cancer Neg Hx      Prior to Admission medications   Medication Sig Start Date End Date Taking? Authorizing Provider  amLODipine (NORVASC) 5 MG tablet Take 1 tablet (5 mg total) by mouth daily. For blood pressure 05/16/22  Yes Claiborne Rigg, NP  atorvastatin (LIPITOR) 40 MG tablet Take 1 tablet (40 mg total) by mouth daily. For cholesterol 01/04/22  Yes Claiborne Rigg, NP  citalopram (CELEXA)  20 MG tablet Take 1 tablet (20 mg total) by mouth daily. For depression 01/04/22  Yes Claiborne Rigg, NP  erythromycin ophthalmic ointment Place 1 Application into the right eye 2 (two) times daily.   Yes [provider]  acetaminophen (TYLENOL) 325 MG tablet Take 650 mg by mouth every 4 (four) hours as needed for moderate pain.    [provider]  Blood Glucose Monitoring Suppl (ONETOUCH VERIO) w/Device KIT Use to check blood sugar three times daily. E11.65 07/08/22   Hoy Register, MD   Blood Pressure Monitor DEVI Please provide patient with insurance approved blood pressure monitor 01/14/19   Claiborne Rigg, NP  carboxymethylcellulose (REFRESH PLUS) 0.5 % SOLN Place 1 drop into the right eye 4 (four) times daily.    [provider]  ceFEPIme 2 g in sodium chloride 0.9 % 100 mL Inject 2 g into the vein every 12 (twelve) hours. Patient not taking: Reported on 07/29/2022 07/13/22   Leatha Gilding, MD  Continuous Glucose Receiver (FREESTYLE LIBRE READER) DEVI Monitor blood glucose levels 5-6 times per day E11.65 05/16/22   Claiborne Rigg, NP  Continuous Glucose Sensor (FREESTYLE LIBRE 2 SENSOR) MISC Monitor blood glucose levels 5-6 times per day E11.65 05/16/22   Claiborne Rigg, NP  fenofibrate (TRICOR) 145 MG tablet Take 1 tablet (145 mg total) by mouth daily. For cholesterol 01/04/22   Claiborne Rigg, NP  fluticasone (FLONASE) 50 MCG/ACT nasal spray Place 2 sprays into both nostrils daily. 01/04/22   Claiborne Rigg, NP  glucose blood test strip Use to check blood sugar three times daily. E11.65 07/08/22   Hoy Register, MD  HUMALOG KWIKPEN 100 UNIT/ML KwikPen Inject 10 Units into the skin 3 (three) times daily. 05/19/22   Hoy Register, MD  Insulin Glargine (BASAGLAR KWIKPEN) 100 UNIT/ML Inject 20 Units into the skin 2 (two) times daily. 05/17/22   Hoy Register, MD  Insulin Pen Needle 31G X 5 MM MISC Inject 1 Device into the skin QID. For use with insulin pens 07/08/22   Hoy Register, MD  Insulin Syringe-Needle U-100 30G X 5/16" 1 ML MISC Use as directed to inject into the skin 3 times daily. 01/04/22   Claiborne Rigg, NP  levofloxacin (LEVAQUIN) 500 MG tablet Take 1 tablet (500 mg total) by mouth daily. Patient not taking: Reported on 07/09/2022 05/26/22   Malachy Mood, MD  losartan (COZAAR) 50 MG tablet Take 50 mg by mouth daily.    [provider]  metroNIDAZOLE (FLAGYL) 500 MG tablet Take 1 tablet (500 mg total) by mouth every 12 (twelve) hours.  07/13/22   Leatha Gilding, MD  ondansetron (ZOFRAN) 8 MG tablet Take 1 tablet (8 mg total) by mouth every 8 (eight) hours as needed for nausea or vomiting. Patient not taking: Reported on 07/09/2022 11/10/21   Malachy Mood, MD  OneTouch Delica Lancets 33G MISC Use to check blood sugar three times daily. E11.65 07/08/22   Hoy Register, MD  oxyCODONE (OXY IR/ROXICODONE) 5 MG immediate release tablet Take 5 mg by mouth every 4 (four) hours as needed for severe pain. Take 0.5 to 1 tablet (2.5-5 mg total) by mouth every 4 (four) hours as needed for up to 5 days    [provider]  pantoprazole (PROTONIX) 40 MG tablet Take 1 tablet (40 mg total) by mouth daily at 6 (six) AM. For heartburn Patient taking differently: Take 40 mg by mouth daily before breakfast. 05/16/22   Meredeth Ide,  Shea Stakes, NP  potassium chloride SA (KLOR-CON M) 20 MEQ tablet Take 1 tablet (20 mEq total) by mouth daily. Patient not taking: Reported on 07/29/2022 01/04/22   Claiborne Rigg, NP  prochlorperazine (COMPAZINE) 10 MG tablet Take 1 tablet (10 mg total) by mouth every 6 (six) hours as needed for nausea or vomiting. 11/10/21   Malachy Mood, MD  sitaGLIPtin-metformin (JANUMET) 50-1000 MG tablet Take 1 tablet by mouth 2 (two) times daily with a meal.    [provider]  vancomycin (VANCOREADY) 750 MG/150ML SOLN Inject 150 mLs (750 mg total) into the vein daily. Patient not taking: Reported on 07/29/2022 07/13/22   Leatha Gilding, MD    Physical Exam: BP (!) 124/57   Pulse 66   Temp 98.4 F (36.9 C) (Oral)   Resp 17   SpO2 98%   General:  Alert, oriented, calm, in no acute distress, patient seen and examined with the assistance of electronic Spanish interpreter Eyes: EOMI, clear conjuctivae, white sclerea Neck: supple, no masses, trachea mildline  Oropharynx is clear Cardiovascular: RRR, no murmurs or rubs, no peripheral edema  Respiratory: clear to auscultation bilaterally, no wheezes, no crackles  Abdomen:  soft, nontender, nondistended, normal bowel tones heard  Skin: dry, no rashes  Musculoskeletal: no joint effusions, normal range of motion  Psychiatric: appropriate affect, normal speech  Neurologic: extraocular muscles intact, clear speech, moving all extremities with intact sensorium          Labs on Admission:  Basic Metabolic Panel: Recent Labs  Lab 07/26/22 0748 07/29/22 1438  NA 131* 134*  K 4.1 3.2*  CL 98 103  CO2 27 24  GLUCOSE 186* 205*  BUN 14 17  CREATININE 0.59 0.58  CALCIUM 8.6* 8.2*   Liver Function Tests: Recent Labs  Lab 07/26/22 0748 07/29/22 1438  AST 14* 13*  ALT 11 12  ALKPHOS 74 61  BILITOT 1.0 0.5  PROT 7.7 7.8  ALBUMIN 3.3* 3.0*   Recent Labs  Lab 07/29/22 1438  LIPASE 29   No results for input(s): "AMMONIA" in the last 168 hours. CBC: Recent Labs  Lab 07/26/22 0748 07/29/22 0858 07/29/22 1438  WBC 1.3* 1.5* 1.3*  NEUTROABS 0.3* 0.1* 0.1*  HGB 6.3* 9.8* 8.4*  HCT 18.4* 28.8* 24.9*  MCV 85.2 83.5 84.4  PLT 18* 14* 22*   Cardiac Enzymes: No results for input(s): "CKTOTAL", "CKMB", "CKMBINDEX", "TROPONINI" in the last 168 hours.  BNP (last 3 results) No results for input(s): "BNP" in the last 8760 hours.  ProBNP (last 3 results) No results for input(s): "PROBNP" in the last 8760 hours.  CBG: No results for input(s): "GLUCAP" in the last 168 hours.  Radiological Exams on Admission: DG Chest Port 1 View  Result Date: 07/29/2022 CLINICAL DATA:  Weakness EXAM: PORTABLE CHEST 1 VIEW COMPARISON:  X-ray 02/03/2022 FINDINGS: Underinflation. No consolidation, pneumothorax or effusion. Mild peribronchial thickening. No edema. Surgical clips in the left axillary region. IMPRESSION: Underinflation.  Mild peribronchial thickening. Electronically Signed   By: Karen Kays M.D.   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  Neutropenic fever-no obvious source of infection found, she was treated recently for facial cellulitis, presents today with some evidence of peribronchial thickening, and sore throat. -Inpatient admission -Follow blood and urine cultures -Empiric IV vancomycin and IV cefepime  Insulin-dependent type 2 diabetes -Resume basal insulin dose -Carb controlled diet -Sliding scale insulin  Thrombocytopenia-is a result of her MDS, received 1 unit transfusion in the oncology clinic today -Follow with daily labs -Plan for platelet transfusion for platelet count less than 20,000  Chronic anemia-due to pancytopenia from MDS, no evidence of bleeding -Follow with daily labs, plan transfusion if hemoglobin less than 8  Depression-Celexa 20 mg p.o. daily  DVT prophylaxis: SCDs    Code Status: Full Code  Consults called: Dr. Mosetta Putt added to treatment team  Admission status: The appropriate patient status for this patient is INPATIENT. Inpatient status is judged to be reasonable and necessary in order to provide the required intensity of service to ensure the patient's safety. The patient's presenting symptoms, physical exam findings, and initial radiographic and laboratory data in the context of their chronic comorbidities is felt to place them at high risk for further clinical deterioration. Furthermore, it is not anticipated that the patient will be medically stable for discharge from the hospital within 2 midnights of admission.    I certify that at the point of admission it is my clinical judgment that the patient will require inpatient hospital care spanning beyond 2 midnights from the point of admission due to high intensity of service, high risk for further deterioration and high frequency of surveillance required  Time spent: 53 minutes  Alese Furniss Sharlette Dense MD [facility] Pager [phone]  If  7PM-7AM, please contact night-coverage [email or web address] Password [facility]  07/29/2022, 5:28 PM

## 2022-07-29 NOTE — ED Notes (Signed)
ED TO INPATIENT HANDOFF REPORT  Name/Age/Gender Amanda Duarte Amanda Duarte 67 y.o. female  Code Status    Code Status Orders  (From admission, onward)           Start     Ordered   07/29/22 1727  Full code  Continuous       Question:  By:  Answer:  Consent: discussion documented in EHR   07/29/22 1727           Code Status History     Date Active Date Inactive Code Status Order ID Comments User Context   07/09/2022 1609 07/14/2022 0206 Full Code [id number]  Albertine Grates, MD ED   12/02/2021 1021 12/22/2021 2309 Full Code [id number]  Teddy Spike, DO ED   10/21/2021 1636 10/26/2021 2009 Full Code [id number]  Teddy Spike, DO Inpatient   11/20/2017 1521 11/21/2017 2008 Full Code [id number]  Claud Kelp, MD Inpatient   04/11/2017 2339 04/15/2017 1910 Full Code [id number]  Hillary Bow, DO ED   05/19/2014 0444 05/19/2014 2102 Full Code [id number]  Glori Luis, MD Inpatient       Home/SNF/Other Home  Chief Complaint Neutropenic fever (HCC) [D70.9, R50.81]  Level of Care/Admitting Diagnosis ED Disposition     ED Disposition  Admit   Condition  --   Comment  Hospital Area: [facility] [[id number]]  Level of Care: Progressive [102]  Admit to Progressive based on following criteria: MULTISYSTEM THREATS such as stable sepsis, metabolic/electrolyte imbalance with or without encephalopathy that is responding to early treatment.  May admit patient to [facility] or [facility] if equivalent level of care is available:: Yes  Covid Evaluation: Asymptomatic - no recent exposure (last 10 days) testing not required  Diagnosis: Neutropenic fever [facility]) [[id number]]  Admitting Physician: Maryln Gottron [[id number]]  Attending Physician: Kirby Crigler, MIR [email or web address] [[id number]]  Certification:: I certify this patient will need inpatient services for at least 2 midnights  Estimated Length of Stay: 3          Medical History Past Medical History:  Diagnosis Date   Cancer (HCC) 03/2017    left breast cancer   Diabetes mellitus without complication (HCC)    Hyperlipidemia    Hypertension    Malignant neoplasm of left breast (HCC)    Stroke (cerebrum) (HCC)    Tibial plateau fracture, left    04-13-17 had ORIF    Allergies No Known Allergies  IV Location/Drains/Wounds Patient Lines/Drains/Airways Status     Active Line/Drains/Airways     Name Placement date Placement time Site Days   Peripheral IV 07/29/22 20 G Anterior;Right;Upper Arm 07/29/22  1746  Arm  less than 1            Labs/Imaging Results for orders placed or performed during the hospital encounter of 07/29/22 (from the past 48 hour(s))  Urinalysis, Routine w reflex microscopic -Urine, Clean Catch     Status: Abnormal   Collection Time: 07/29/22 10:37 AM  Result Value Ref Range   Color, Urine AMBER (A) YELLOW    Comment: BIOCHEMICALS MAY BE AFFECTED BY COLOR   APPearance TURBID (A) CLEAR   Specific Gravity, Urine 1.021 1.005 - 1.030   pH 5.0 5.0 - 8.0   Glucose, UA >=500 (A) NEGATIVE mg/dL   Hgb urine dipstick MODERATE (A) NEGATIVE   Bilirubin Urine NEGATIVE NEGATIVE   Ketones, ur NEGATIVE NEGATIVE mg/dL   Protein, ur 30 (A) NEGATIVE mg/dL   Nitrite NEGATIVE NEGATIVE   Leukocytes,Ua NEGATIVE NEGATIVE  RBC / HPF 11-20 0 - 5 RBC/hpf   WBC, UA 0-5 0 - 5 WBC/hpf   Bacteria, UA NONE SEEN NONE SEEN   Squamous Epithelial / HPF 0-5 0 - 5 /HPF   Mucus PRESENT     Comment: Performed at [facility], [address] [place], [place] [address]  CBC with Differential     Status: Abnormal   Collection Time: 07/29/22  2:38 PM  Result Value Ref Range   WBC 1.3 (LL) 4.0 - 10.5 K/uL    Comment: REPEATED TO VERIFY THIS CRITICAL RESULT HAS VERIFIED AND BEEN CALLED TO Raydan Schlabach, D RN BY ABDULHALIM,MALAIKA ON 07 05 2024 AT 1550, AND HAS BEEN READ BACK.     RBC 2.95 (L) 3.87 - 5.11 MIL/uL   Hemoglobin 8.4 (L) 12.0 - 15.0 g/dL   HCT 78.4 (L) 69.6 - 29.5 %   MCV 84.4 80.0 - 100.0 fL   MCH  28.5 26.0 - 34.0 pg   MCHC 33.7 30.0 - 36.0 g/dL   RDW 28.4 13.2 - 44.0 %   Platelets 22 (LL) 150 - 400 K/uL    Comment: Immature Platelet Fraction may be clinically indicated, consider ordering this additional test [id number] REPEATED TO VERIFY PLATELET COUNT CONFIRMED BY SMEAR THIS CRITICAL RESULT HAS VERIFIED AND BEEN CALLED TO Adalay Azucena, D RN BY ABDULHALIM,MALAIKA ON 07 05 2024 AT 1550, AND HAS BEEN READ BACK.     nRBC 1.5 (H) 0.0 - 0.2 %   Neutrophils Relative % 5 %   Lymphocytes Relative 92 %   Monocytes Relative 2 %   Eosinophils Relative 0 %   Basophils Relative 0 %   Band Neutrophils 0 %   Metamyelocytes Relative 0 %   Myelocytes 0 %   Promyelocytes Relative 0 %   Blasts 0 %   nRBC 0 0 /100 WBC   Other 1 %   Neutro Abs 0.1 (LL) 1.7 - 7.7 K/uL   Lymphs Abs 1.2 0.7 - 4.0 K/uL   Monocytes Absolute 0.0 (L) 0.1 - 1.0 K/uL   Eosinophils Absolute 0.0 0.0 - 0.5 K/uL   Basophils Absolute 0.0 0.0 - 0.1 K/uL   Abs Immature Granulocytes 0.00 0.00 - 0.07 K/uL    Comment: Performed at [facility], [address], [place], [place] [address]  Comprehensive metabolic panel     Status: Abnormal   Collection Time: 07/29/22  2:38 PM  Result Value Ref Range   Sodium 134 (L) 135 - 145 mmol/L   Potassium 3.2 (L) 3.5 - 5.1 mmol/L   Chloride 103 98 - 111 mmol/L   CO2 24 22 - 32 mmol/L   Glucose, Bld 205 (H) 70 - 99 mg/dL    Comment: Glucose reference range applies only to samples taken after fasting for at least 8 hours.   BUN 17 8 - 23 mg/dL   Creatinine, Ser 4.74 0.44 - 1.00 mg/dL   Calcium 8.2 (L) 8.9 - 10.3 mg/dL   Total Protein 7.8 6.5 - 8.1 g/dL   Albumin 3.0 (L) 3.5 - 5.0 g/dL   AST 13 (L) 15 - 41 U/L   ALT 12 0 - 44 U/L   Alkaline Phosphatase 61 38 - 126 U/L   Total Bilirubin 0.5 0.3 - 1.2 mg/dL   GFR, Estimated >25 >95 mL/min    Comment: (NOTE) Calculated using the CKD-EPI Creatinine Equation (2021)    Anion gap 7 5 - 15    Comment: Performed at [facility], [address], [place], [place] [address]  Lipase, blood     Status: None   Collection Time: 07/29/22  2:38 PM  Result Value Ref Range   Lipase 29 11 - 51 U/L    Comment: Performed at [facility], [address] [place], [place] [address]  Magnesium     Status: Abnormal   Collection Time: 07/29/22  2:38 PM  Result Value Ref Range   Magnesium 1.5 (L) 1.7 - 2.4 mg/dL    Comment: Performed at [facility], [address], [place], [place] [address]  Resp panel by RT-PCR (RSV, Flu A&B, Covid) Anterior Nasal Swab     Status: None   Collection Time: 07/29/22  2:39 PM   Specimen: Anterior Nasal Swab  Result Value Ref Range   SARS Coronavirus 2 by RT PCR NEGATIVE NEGATIVE    Comment: (NOTE) SARS-CoV-2 target nucleic acids are NOT DETECTED.  The SARS-CoV-2 RNA is generally detectable in upper respiratory specimens during the acute phase of infection. The lowest concentration of SARS-CoV-2 viral copies this assay can detect is 138 copies/mL. A negative result does not preclude SARS-Cov-2 infection and should not be used as the sole basis for treatment or other patient management decisions. A negative result may occur with  improper specimen collection/handling, submission of specimen other than nasopharyngeal swab, presence of viral mutation(s) within the areas targeted by this assay, and inadequate number of viral copies(<138 copies/mL). A negative result must be combined with clinical observations, patient history, and epidemiological information. The expected result is Negative.  Fact Sheet for Patients:  BloggerCourse.com  Fact Sheet for Healthcare Providers:  SeriousBroker.it  This test is no t yet approved or cleared by the [place] FDA and  has been authorized for detection and/or diagnosis of SARS-CoV-2 by FDA under an Emergency Use Authorization (EUA). This  EUA will remain  in effect (meaning this test can be used) for the duration of the COVID-19 declaration under Section 564(b)(1) of the Act, 21 U.S.C.section 360bbb-3(b)(1), unless the authorization is terminated  or revoked sooner.       Influenza A by PCR NEGATIVE NEGATIVE   Influenza B by PCR NEGATIVE NEGATIVE    Comment: (NOTE) The Xpert Xpress SARS-CoV-2/FLU/RSV plus assay is intended as an aid in the diagnosis of influenza from Nasopharyngeal swab specimens and should not be used as a sole basis for treatment. Nasal washings and aspirates are unacceptable for Xpert Xpress SARS-CoV-2/FLU/RSV testing.  Fact Sheet for Patients: BloggerCourse.com  Fact Sheet for Healthcare Providers: SeriousBroker.it  This test is not yet approved or cleared by the [place] FDA and has been authorized for detection and/or diagnosis of SARS-CoV-2 by FDA under an Emergency Use Authorization (EUA). This EUA will remain in effect (meaning this test can be used) for the duration of the COVID-19 declaration under Section 564(b)(1) of the Act, 21 U.S.C. section 360bbb-3(b)(1), unless the authorization is terminated or revoked.     Resp Syncytial Virus by PCR NEGATIVE NEGATIVE    Comment: (NOTE) Fact Sheet for Patients: BloggerCourse.com  Fact Sheet for Healthcare Providers: SeriousBroker.it  This test is not yet approved or cleared by the [place] FDA and has been authorized for detection and/or diagnosis of SARS-CoV-2 by FDA under an Emergency Use Authorization (EUA). This EUA will remain in effect (meaning this test can be used) for the duration of the COVID-19 declaration under Section 564(b)(1) of the Act, 21 U.S.C. section 360bbb-3(b)(1), unless the authorization  is terminated or revoked.  Performed at [facility], [address], [place], [place] [address]    Group A Strep by PCR     Status: None   Collection Time: 07/29/22  2:40 PM   Specimen: Throat; Sterile Swab  Result Value Ref Range   Group A Strep by PCR NOT DETECTED NOT DETECTED    Comment: Performed at [facility], [address] [place], [place] [address]  CBG monitoring, ED     Status: Abnormal   Collection Time: 07/29/22  5:52 PM  Result Value Ref Range   Glucose-Capillary 162 (H) 70 - 99 mg/dL    Comment: Glucose reference range applies only to samples taken after fasting for at least 8 hours.   *Note: Due to a large number of results and/or encounters for the requested time period, some results have not been displayed. A complete set of results can be found in Results Review.   DG Chest Port 1 View  Result Date: 07/29/2022 CLINICAL DATA:  Weakness EXAM: PORTABLE CHEST 1 VIEW COMPARISON:  X-ray 02/03/2022 FINDINGS: Underinflation. No consolidation, pneumothorax or effusion. Mild peribronchial thickening. No edema. Surgical clips in the left axillary region. IMPRESSION: Underinflation.  Mild peribronchial thickening. Electronically Signed   By: Karen Kays M.D.   On: 07/29/2022 15:08    Pending Labs Unresulted Labs (From admission, onward)     Start     Ordered   07/30/22 0500  Creatinine, serum  Tomorrow morning,   R        07/29/22 1825   07/29/22 1630  Pathologist smear review  Once,   R        07/29/22 1630   07/29/22 1438  Pathologist smear review  Once,   STAT        07/29/22 1438            Vitals/Pain Today's Vitals   07/29/22 1645 07/29/22 1747 07/29/22 1748 07/29/22 1858  BP: (!) 124/57   (!) 158/69  Pulse: 66   87  Resp: 17   17  Temp:   99.3 F (37.4 C) (!) 100.9 F (38.3 C)  TempSrc:   Oral Oral  SpO2: 98%   97%  Weight:  55 kg 52.1 kg   Height:  5' (1.524 m)    PainSc:        Isolation Precautions No active isolations  Medications Medications  vancomycin (VANCOCIN) IVPB 1000 mg/200 mL premix (1,000 mg Intravenous New  Bag/Given 07/29/22 1817)  oxyCODONE (Oxy IR/ROXICODONE) immediate release tablet 2.5-5 mg (has no administration in time range)  acetaminophen (TYLENOL) tablet 650 mg (has no administration in time range)  losartan (COZAAR) tablet 50 mg (has no administration in time range)  citalopram (CELEXA) tablet 20 mg (has no administration in time range)  pantoprazole (PROTONIX) EC tablet 40 mg (has no administration in time range)  insulin aspart (novoLOG) injection 0-15 Units (3 Units Subcutaneous Given 07/29/22 1759)  insulin aspart (novoLOG) injection 0-5 Units (has no administration in time range)  acetaminophen (TYLENOL) tablet 650 mg (has no administration in time range)    Or  acetaminophen (TYLENOL) suppository 650 mg (has no administration in time range)  traZODone (DESYREL) tablet 25 mg (has no administration in time range)  ondansetron (ZOFRAN) tablet 4 mg (has no administration in time range)    Or  ondansetron (ZOFRAN) injection 4 mg (has no administration in time range)  albuterol (PROVENTIL) (2.5 MG/3ML) 0.083% nebulizer solution 2.5 mg (has  no administration in time range)  insulin glargine-yfgn (SEMGLEE) injection 20 Units (has no administration in time range)  vancomycin (VANCOREADY) IVPB 750 mg/150 mL (has no administration in time range)  ceFEPIme (MAXIPIME) 2 g in sodium chloride 0.9 % 100 mL IVPB (has no administration in time range)  ceFEPIme (MAXIPIME) 2 g in sodium chloride 0.9 % 100 mL IVPB (0 g Intravenous Stopped 07/29/22 1818)  potassium chloride SA (KLOR-CON M) CR tablet 40 mEq (40 mEq Oral Given 07/29/22 1755)    Mobility walks with person assist

## 2022-07-29 NOTE — Progress Notes (Signed)
A consult was received from an ED physician for vanc per pharmacy dosing.  The patient's profile has been reviewed for ht/wt/allergies/indication/available labs.   A one time order has been placed for vanc 1g.  Further antibiotics/pharmacy consults should be ordered by admitting physician if indicated.                       Thank you, Berkley Harvey 07/29/2022  4:27 PM

## 2022-07-29 NOTE — Progress Notes (Signed)
Pharmacy Antibiotic Note  Amanda Duarte is a 67 y.o. female admitted on 07/29/2022 with fever. Patient with sore throat. 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 She has been on prophylactic levofloxacin. Pharmacy has been consulted for vancomycin and cefepime dosing.  Plan: -Vancomycin 1 g then 750 mg IV q24h -Cefepime 2 g IV q12h -Continue to follow renal function, cultures and clinical progress for dose adjustments and de-escalation as indicated  Height: 5' (152.4 cm) Weight: 52.1 kg (114 lb 12.8 oz) IBW/kg (Calculated) : 45.5  Temp (24hrs), Avg:99.8 F (37.7 C), Min:98.3 F (36.8 C), Max:103.2 F (39.6 C)  Recent Labs  Lab 07/26/22 0748 07/29/22 0858 07/29/22 1438  WBC 1.3* 1.5* 1.3*  CREATININE 0.59  --  0.58    Estimated Creatinine Clearance: 49 mL/min (by C-G formula based on SCr of 0.58 mg/dL).    No Known Allergies  Antimicrobials this admission: Vancomycin 7/5 >> Cefepime 7/5 >>  Dose adjustments this admission: NA  Microbiology results: NA  Thank you for allowing pharmacy to be a part of this patient's care.  Pricilla Riffle, PharmD, BCPS Clinical Pharmacist 07/29/2022 6:24 PM

## 2022-07-29 NOTE — ED Provider Notes (Signed)
[facility] AT [facility] Provider Note   CSN: [id number] Arrival date & time: 07/29/22  1223     History  Chief Complaint  Patient presents with   Fever    Amanda Duarte is a 67 y.o. female.  HPI   67 year old female presents emergency department from cancer center for concern of fever.  Patient has history of myelodysplastic disorder, follows with Dr. Mosetta Putt.  Was noted to be febrile at her visit today.  Urine culture and blood culture sent and she was referred here for further evaluation.  Patient is Spanish-speaking, interpreter services used but even with this she is a a poor historian, states that she feels generally weak with decreased appetitie but has no specific complaint for me at this time.   Home Medications Prior to Admission medications   Medication Sig Start Date End Date Taking? Authorizing Provider  acetaminophen (TYLENOL) 325 MG tablet Take 650 mg by mouth every 4 (four) hours as needed for moderate pain.    [provider]  amLODipine (NORVASC) 5 MG tablet Take 1 tablet (5 mg total) by mouth daily. For blood pressure 05/16/22   Claiborne Rigg, NP  amoxicillin-clavulanate (AUGMENTIN) 875-125 MG tablet Take 1 tablet by mouth 2 (two) times daily.    [provider]  atorvastatin (LIPITOR) 40 MG tablet Take 1 tablet (40 mg total) by mouth daily. For cholesterol 01/04/22   Claiborne Rigg, NP  Blood Glucose Monitoring Suppl (ONETOUCH VERIO) w/Device KIT Use to check blood sugar three times daily. E11.65 07/08/22   Hoy Register, MD  Blood Pressure Monitor DEVI Please provide patient with insurance approved blood pressure monitor 01/14/19   Claiborne Rigg, NP  carboxymethylcellulose (REFRESH PLUS) 0.5 % SOLN Place 1 drop into the right eye 4 (four) times daily.    [provider]  ceFEPIme 2 g in sodium chloride 0.9 % 100 mL Inject 2 g into the vein every 12 (twelve) hours. 07/13/22   Leatha Gilding, MD   citalopram (CELEXA) 20 MG tablet Take 1 tablet (20 mg total) by mouth daily. For depression Patient not taking: Reported on 07/09/2022 01/04/22   Claiborne Rigg, NP  Continuous Glucose Receiver (FREESTYLE LIBRE READER) DEVI Monitor blood glucose levels 5-6 times per day E11.65 05/16/22   Claiborne Rigg, NP  Continuous Glucose Sensor (FREESTYLE LIBRE 2 SENSOR) MISC Monitor blood glucose levels 5-6 times per day E11.65 05/16/22   Claiborne Rigg, NP  erythromycin ophthalmic ointment Place 1 Application into the right eye 3 (three) times daily.    [provider]  fenofibrate (TRICOR) 145 MG tablet Take 1 tablet (145 mg total) by mouth daily. For cholesterol 01/04/22   Claiborne Rigg, NP  fluticasone (FLONASE) 50 MCG/ACT nasal spray Place 2 sprays into both nostrils daily. Patient not taking: Reported on 07/09/2022 01/04/22   Claiborne Rigg, NP  glucose blood test strip Use to check blood sugar three times daily. E11.65 07/08/22   Hoy Register, MD  HUMALOG KWIKPEN 100 UNIT/ML KwikPen Inject 10 Units into the skin 3 (three) times daily. 05/19/22   Hoy Register, MD  Insulin Glargine (BASAGLAR KWIKPEN) 100 UNIT/ML Inject 20 Units into the skin 2 (two) times daily. 05/17/22   Hoy Register, MD  Insulin Pen Needle 31G X 5 MM MISC Inject 1 Device into the skin QID. For use with insulin pens 07/08/22   Hoy Register, MD  Insulin Syringe-Needle U-100 30G X 5/16" 1 ML  MISC Use as directed to inject into the skin 3 times daily. 01/04/22   Claiborne Rigg, NP  levofloxacin (LEVAQUIN) 500 MG tablet Take 1 tablet (500 mg total) by mouth daily. Patient not taking: Reported on 07/09/2022 05/26/22   Malachy Mood, MD  losartan (COZAAR) 50 MG tablet Take 50 mg by mouth daily.    [provider]  metroNIDAZOLE (FLAGYL) 500 MG tablet Take 1 tablet (500 mg total) by mouth every 12 (twelve) hours. 07/13/22   Leatha Gilding, MD  ondansetron (ZOFRAN) 8 MG tablet Take 1 tablet (8 mg total) by mouth  every 8 (eight) hours as needed for nausea or vomiting. Patient not taking: Reported on 07/09/2022 11/10/21   Malachy Mood, MD  OneTouch Delica Lancets 33G MISC Use to check blood sugar three times daily. E11.65 07/08/22   Hoy Register, MD  oxyCODONE (OXY IR/ROXICODONE) 5 MG immediate release tablet Take 5 mg by mouth every 4 (four) hours as needed for severe pain. Take 0.5 to 1 tablet (2.5-5 mg total) by mouth every 4 (four) hours as needed for up to 5 days    [provider]  pantoprazole (PROTONIX) 40 MG tablet Take 1 tablet (40 mg total) by mouth daily at 6 (six) AM. For heartburn Patient taking differently: Take 40 mg by mouth daily before breakfast. 05/16/22   Claiborne Rigg, NP  potassium chloride SA (KLOR-CON M) 20 MEQ tablet Take 1 tablet (20 mEq total) by mouth daily. 01/04/22   Claiborne Rigg, NP  prochlorperazine (COMPAZINE) 10 MG tablet Take 1 tablet (10 mg total) by mouth every 6 (six) hours as needed for nausea or vomiting. Patient not taking: Reported on 07/09/2022 11/10/21   Malachy Mood, MD  sitaGLIPtin-metformin (JANUMET) 50-1000 MG tablet Take 1 tablet by mouth 2 (two) times daily with a meal.    [provider]  vancomycin (VANCOREADY) 750 MG/150ML SOLN Inject 150 mLs (750 mg total) into the vein daily. 07/13/22   Leatha Gilding, MD      Allergies    Patient has no known allergies.    Review of Systems   Review of Systems  Constitutional:  Positive for appetite change, chills, fatigue and fever.  Respiratory:  Negative for cough and shortness of breath.   Cardiovascular:  Negative for chest pain.  Gastrointestinal:  Positive for abdominal pain. Negative for diarrhea and vomiting.  Genitourinary:  Negative for dysuria.  Musculoskeletal:  Positive for myalgias.    Physical Exam Updated Vital Signs BP (!) 106/51   Pulse 62   Temp 98.3 F (36.8 C) (Oral)   Resp 17   SpO2 98%  Physical Exam Vitals and nursing note reviewed.  Constitutional:       General: She is not in acute distress.    Appearance: Normal appearance. She is ill-appearing.  HENT:     Head: Normocephalic.     Mouth/Throat:     Mouth: Mucous membranes are moist.  Cardiovascular:     Rate and Rhythm: Normal rate.  Pulmonary:     Effort: Pulmonary effort is normal. No respiratory distress.  Abdominal:     General: Bowel sounds are normal. There is no distension.     Palpations: Abdomen is soft.     Tenderness: There is abdominal tenderness. There is no guarding or rebound.  Musculoskeletal:     Cervical back: No rigidity.  Skin:    General: Skin is warm.  Neurological:     Mental Status: She is alert  and oriented to person, place, and time. Mental status is at baseline.  Psychiatric:        Mood and Affect: Mood normal.     ED Results / Procedures / Treatments   Labs (all labs ordered are listed, but only abnormal results are displayed) Labs Reviewed  RESP PANEL BY RT-PCR (RSV, FLU A&B, COVID)  [id number]  GROUP A STREP BY PCR  CBC WITH DIFFERENTIAL/PLATELET  COMPREHENSIVE METABOLIC PANEL  URINALYSIS, ROUTINE W REFLEX MICROSCOPIC    EKG None  Radiology No results found.  Procedures Procedures    Medications Ordered in ED Medications - No data to display  ED Course/ Medical Decision Making/ A&P                             Medical Decision Making Amount and/or Complexity of Data Reviewed Labs: ordered. Radiology: ordered.   67 year old female presents emergency department with concern for neutropenic fever from the cancer center.  Tmax was 103 there.  Urine culture and blood culture has been sent off prior to arrival here.  Her only complaint is mild mid abdominal discomfort and decreased appetite.  She is afebrile here.  Blood work, urine and chest x-ray drawn and pending.  Patient signed out pending these results and reevaluation but I anticipate admission for neutropenic fever.        Final Clinical Impression(s) / ED  Diagnoses Final diagnoses:  None    Rx / DC Orders ED Discharge Orders     None         Rozelle Logan, DO 07/29/22 1521

## 2022-07-29 NOTE — ED Notes (Signed)
ED TO INPATIENT HANDOFF REPORT  Name/Age/Gender Amanda Duarte 67 y.o. female  Code Status    Code Status Orders  (From admission, onward)           Start     Ordered   07/29/22 1727  Full code  Continuous       Question:  By:  Answer:  Consent: discussion documented in EHR   07/29/22 1727           Code Status History     Date Active Date Inactive Code Status Order ID Comments User Context   07/09/2022 1609 07/14/2022 0206 Full Code [id number]  Albertine Grates, MD ED   12/02/2021 1021 12/22/2021 2309 Full Code [id number]  Teddy Spike, DO ED   10/21/2021 1636 10/26/2021 2009 Full Code [id number]  Teddy Spike, DO Inpatient   11/20/2017 1521 11/21/2017 2008 Full Code [id number]  Claud Kelp, MD Inpatient   04/11/2017 2339 04/15/2017 1910 Full Code [id number]  Hillary Bow, DO ED   05/19/2014 0444 05/19/2014 2102 Full Code [id number]  Glori Luis, MD Inpatient       Home/SNF/Other Home  Chief Complaint Neutropenic fever (HCC) [D70.9, R50.81]  Level of Care/Admitting Diagnosis ED Disposition     ED Disposition  Admit   Condition  --   Comment  Hospital Area: [facility] [[id number]]  Level of Care: Progressive [102]  Admit to Progressive based on following criteria: MULTISYSTEM THREATS such as stable sepsis, metabolic/electrolyte imbalance with or without encephalopathy that is responding to early treatment.  May admit patient to [facility] or [facility] if equivalent level of care is available:: Yes  Covid Evaluation: Asymptomatic - no recent exposure (last 10 days) testing not required  Diagnosis: Neutropenic fever [facility]) [[id number]]  Admitting Physician: Maryln Gottron [[id number]]  Attending Physician: Kirby Crigler, MIR Jaxson.Roy [[id number]]  Certification:: I certify this patient will need inpatient services for at least 2 midnights  Estimated Length of Stay: 3          Medical History Past Medical History:  Diagnosis Date   Cancer (HCC) 03/2017    left breast cancer   Diabetes mellitus without complication (HCC)    Hyperlipidemia    Hypertension    Malignant neoplasm of left breast (HCC)    Stroke (cerebrum) (HCC)    Tibial plateau fracture, left    04-13-17 had ORIF    Allergies No Known Allergies  IV Location/Drains/Wounds Patient Lines/Drains/Airways Status     Active Line/Drains/Airways     Name Placement date Placement time Site Days   Peripheral IV 07/29/22 20 G Anterior;Right;Upper Arm 07/29/22  1746  Arm  less than 1            Labs/Imaging Results for orders placed or performed during the hospital encounter of 07/29/22 (from the past 48 hour(s))  Urinalysis, Routine w reflex microscopic -Urine, Clean Catch     Status: Abnormal   Collection Time: 07/29/22 10:37 AM  Result Value Ref Range   Color, Urine AMBER (A) YELLOW    Comment: BIOCHEMICALS MAY BE AFFECTED BY COLOR   APPearance TURBID (A) CLEAR   Specific Gravity, Urine 1.021 1.005 - 1.030   pH 5.0 5.0 - 8.0   Glucose, UA >=500 (A) NEGATIVE mg/dL   Hgb urine dipstick MODERATE (A) NEGATIVE   Bilirubin Urine NEGATIVE NEGATIVE   Ketones, ur NEGATIVE NEGATIVE mg/dL   Protein, ur 30 (A) NEGATIVE mg/dL   Nitrite NEGATIVE NEGATIVE   Leukocytes,Ua NEGATIVE NEGATIVE  RBC / HPF 11-20 0 - 5 RBC/hpf   WBC, UA 0-5 0 - 5 WBC/hpf   Bacteria, UA NONE SEEN NONE SEEN   Squamous Epithelial / HPF 0-5 0 - 5 /HPF   Mucus PRESENT     Comment: Performed at [facility], [address], [place], [place] [address]  CBC with Differential     Status: Abnormal   Collection Time: 07/29/22  2:38 PM  Result Value Ref Range   WBC 1.3 (LL) 4.0 - 10.5 K/uL    Comment: REPEATED TO VERIFY THIS CRITICAL RESULT HAS VERIFIED AND BEEN CALLED TO Tenley Winward, D RN BY ABDULHALIM,MALAIKA ON 07 05 2024 AT 1550, AND HAS BEEN READ BACK.     RBC 2.95 (L) 3.87 - 5.11 MIL/uL   Hemoglobin 8.4 (L) 12.0 - 15.0 g/dL   HCT 60.4 (L) 54.0 - 98.1 %   MCV 84.4 80.0 - 100.0 fL   MCH  28.5 26.0 - 34.0 pg   MCHC 33.7 30.0 - 36.0 g/dL   RDW 19.1 47.8 - 29.5 %   Platelets 22 (LL) 150 - 400 K/uL    Comment: Immature Platelet Fraction may be clinically indicated, consider ordering this additional test [id number] REPEATED TO VERIFY PLATELET COUNT CONFIRMED BY SMEAR THIS CRITICAL RESULT HAS VERIFIED AND BEEN CALLED TO Abdulkarim Eberlin, D RN BY ABDULHALIM,MALAIKA ON 07 05 2024 AT 1550, AND HAS BEEN READ BACK.     nRBC 1.5 (H) 0.0 - 0.2 %   Neutrophils Relative % 5 %   Lymphocytes Relative 92 %   Monocytes Relative 2 %   Eosinophils Relative 0 %   Basophils Relative 0 %   Band Neutrophils 0 %   Metamyelocytes Relative 0 %   Myelocytes 0 %   Promyelocytes Relative 0 %   Blasts 0 %   nRBC 0 0 /100 WBC   Other 1 %   Neutro Abs 0.1 (LL) 1.7 - 7.7 K/uL   Lymphs Abs 1.2 0.7 - 4.0 K/uL   Monocytes Absolute 0.0 (L) 0.1 - 1.0 K/uL   Eosinophils Absolute 0.0 0.0 - 0.5 K/uL   Basophils Absolute 0.0 0.0 - 0.1 K/uL   Abs Immature Granulocytes 0.00 0.00 - 0.07 K/uL    Comment: Performed at [facility], [address], [place], [place] [address]  Comprehensive metabolic panel     Status: Abnormal   Collection Time: 07/29/22  2:38 PM  Result Value Ref Range   Sodium 134 (L) 135 - 145 mmol/L   Potassium 3.2 (L) 3.5 - 5.1 mmol/L   Chloride 103 98 - 111 mmol/L   CO2 24 22 - 32 mmol/L   Glucose, Bld 205 (H) 70 - 99 mg/dL    Comment: Glucose reference range applies only to samples taken after fasting for at least 8 hours.   BUN 17 8 - 23 mg/dL   Creatinine, Ser 6.29 0.44 - 1.00 mg/dL   Calcium 8.2 (L) 8.9 - 10.3 mg/dL   Total Protein 7.8 6.5 - 8.1 g/dL   Albumin 3.0 (L) 3.5 - 5.0 g/dL   AST 13 (L) 15 - 41 U/L   ALT 12 0 - 44 U/L   Alkaline Phosphatase 61 38 - 126 U/L   Total Bilirubin 0.5 0.3 - 1.2 mg/dL   GFR, Estimated >52 >84 mL/min    Comment: (NOTE) Calculated using the CKD-EPI Creatinine Equation (2021)    Anion gap 7 5 - 15    Comment: Performed at [facility], [address], [place], [place] [address]  Lipase, blood     Status: None   Collection Time: 07/29/22  2:38 PM  Result Value Ref Range   Lipase 29 11 - 51 U/L    Comment: Performed at [facility], [address], [place], [place] [address]  Magnesium     Status: Abnormal   Collection Time: 07/29/22  2:38 PM  Result Value Ref Range   Magnesium 1.5 (L) 1.7 - 2.4 mg/dL    Comment: Performed at [facility], [address], [place], [place] [address]  Resp panel by RT-PCR (RSV, Flu A&B, Covid) Anterior Nasal Swab     Status: None   Collection Time: 07/29/22  2:39 PM   Specimen: Anterior Nasal Swab  Result Value Ref Range   SARS Coronavirus 2 by RT PCR NEGATIVE NEGATIVE    Comment: (NOTE) SARS-CoV-2 target nucleic acids are NOT DETECTED.  The SARS-CoV-2 RNA is generally detectable in upper respiratory specimens during the acute phase of infection. The lowest concentration of SARS-CoV-2 viral copies this assay can detect is 138 copies/mL. A negative result does not preclude SARS-Cov-2 infection and should not be used as the sole basis for treatment or other patient management decisions. A negative result may occur with  improper specimen collection/handling, submission of specimen other than nasopharyngeal swab, presence of viral mutation(s) within the areas targeted by this assay, and inadequate number of viral copies(<138 copies/mL). A negative result must be combined with clinical observations, patient history, and epidemiological information. The expected result is Negative.  Fact Sheet for Patients:  BloggerCourse.com  Fact Sheet for Healthcare Providers:  SeriousBroker.it  This test is no t yet approved or cleared by the [place] FDA and  has been authorized for detection and/or diagnosis of SARS-CoV-2 by FDA under an Emergency Use Authorization (EUA). This  EUA will remain  in effect (meaning this test can be used) for the duration of the COVID-19 declaration under Section 564(b)(1) of the Act, 21 U.S.C.section 360bbb-3(b)(1), unless the authorization is terminated  or revoked sooner.       Influenza A by PCR NEGATIVE NEGATIVE   Influenza B by PCR NEGATIVE NEGATIVE    Comment: (NOTE) The Xpert Xpress SARS-CoV-2/FLU/RSV plus assay is intended as an aid in the diagnosis of influenza from Nasopharyngeal swab specimens and should not be used as a sole basis for treatment. Nasal washings and aspirates are unacceptable for Xpert Xpress SARS-CoV-2/FLU/RSV testing.  Fact Sheet for Patients: BloggerCourse.com  Fact Sheet for Healthcare Providers: SeriousBroker.it  This test is not yet approved or cleared by the [place] FDA and has been authorized for detection and/or diagnosis of SARS-CoV-2 by FDA under an Emergency Use Authorization (EUA). This EUA will remain in effect (meaning this test can be used) for the duration of the COVID-19 declaration under Section 564(b)(1) of the Act, 21 U.S.C. section 360bbb-3(b)(1), unless the authorization is terminated or revoked.     Resp Syncytial Virus by PCR NEGATIVE NEGATIVE    Comment: (NOTE) Fact Sheet for Patients: BloggerCourse.com  Fact Sheet for Healthcare Providers: SeriousBroker.it  This test is not yet approved or cleared by the [place] FDA and has been authorized for detection and/or diagnosis of SARS-CoV-2 by FDA under an Emergency Use Authorization (EUA). This EUA will remain in effect (meaning this test can be used) for the duration of the COVID-19 declaration under Section 564(b)(1) of the Act, 21 U.S.C. section 360bbb-3(b)(1), unless the authorization  is terminated or revoked.  Performed at [facility], [address] [place], [place] [address]    Group A Strep by PCR     Status: None   Collection Time: 07/29/22  2:40 PM   Specimen: Throat; Sterile Swab  Result Value Ref Range   Group A Strep by PCR NOT DETECTED NOT DETECTED    Comment: Performed at [facility], [address], [place], [place] [address]  CBG monitoring, ED     Status: Abnormal   Collection Time: 07/29/22  5:52 PM  Result Value Ref Range   Glucose-Capillary 162 (H) 70 - 99 mg/dL    Comment: Glucose reference range applies only to samples taken after fasting for at least 8 hours.   *Note: Due to a large number of results and/or encounters for the requested time period, some results have not been displayed. A complete set of results can be found in Results Review.   DG Chest Port 1 View  Result Date: 07/29/2022 CLINICAL DATA:  Weakness EXAM: PORTABLE CHEST 1 VIEW COMPARISON:  X-ray 02/03/2022 FINDINGS: Underinflation. No consolidation, pneumothorax or effusion. Mild peribronchial thickening. No edema. Surgical clips in the left axillary region. IMPRESSION: Underinflation.  Mild peribronchial thickening. Electronically Signed   By: Karen Kays M.D.   On: 07/29/2022 15:08    Pending Labs Unresulted Labs (From admission, onward)     Start     Ordered   07/30/22 0500  Creatinine, serum  Tomorrow morning,   R        07/29/22 1825   07/29/22 1630  Pathologist smear review  Once,   R        07/29/22 1630   07/29/22 1438  Pathologist smear review  Once,   STAT        07/29/22 1438            Vitals/Pain Today's Vitals   07/29/22 1623 07/29/22 1645 07/29/22 1747 07/29/22 1748  BP:  (!) 124/57    Pulse:  66    Resp:  17    Temp: 98.4 F (36.9 C)   99.3 F (37.4 C)  TempSrc: Oral   Oral  SpO2:  98%    Weight:   55 kg 52.1 kg  Height:   5' (1.524 m)   PainSc:        Isolation Precautions No active isolations  Medications Medications  vancomycin (VANCOCIN) IVPB 1000 mg/200 mL premix (1,000 mg Intravenous New Bag/Given 07/29/22 1817)   oxyCODONE (Oxy IR/ROXICODONE) immediate release tablet 2.5-5 mg (has no administration in time range)  acetaminophen (TYLENOL) tablet 650 mg (has no administration in time range)  losartan (COZAAR) tablet 50 mg (has no administration in time range)  citalopram (CELEXA) tablet 20 mg (has no administration in time range)  pantoprazole (PROTONIX) EC tablet 40 mg (has no administration in time range)  insulin aspart (novoLOG) injection 0-15 Units (3 Units Subcutaneous Given 07/29/22 1759)  insulin aspart (novoLOG) injection 0-5 Units (has no administration in time range)  acetaminophen (TYLENOL) tablet 650 mg (has no administration in time range)    Or  acetaminophen (TYLENOL) suppository 650 mg (has no administration in time range)  traZODone (DESYREL) tablet 25 mg (has no administration in time range)  ondansetron (ZOFRAN) tablet 4 mg (has no administration in time range)    Or  ondansetron (ZOFRAN) injection 4 mg (has no administration in time range)  albuterol (PROVENTIL) (2.5 MG/3ML) 0.083% nebulizer solution 2.5 mg (has no administration  in time range)  insulin glargine-yfgn (SEMGLEE) injection 20 Units (has no administration in time range)  vancomycin (VANCOREADY) IVPB 750 mg/150 mL (has no administration in time range)  ceFEPIme (MAXIPIME) 2 g in sodium chloride 0.9 % 100 mL IVPB (has no administration in time range)  ceFEPIme (MAXIPIME) 2 g in sodium chloride 0.9 % 100 mL IVPB (0 g Intravenous Stopped 07/29/22 1818)  potassium chloride SA (KLOR-CON M) CR tablet 40 mEq (40 mEq Oral Given 07/29/22 1755)    Mobility walks

## 2022-07-29 NOTE — ED Notes (Signed)
IV failed causing delay on medications.

## 2022-07-29 NOTE — ED Triage Notes (Signed)
Pt brought from [facility] for fever. Pt originally had a temp of 103- Tylenol, benadryl, platelets given PTA, blood cultures drawn also.

## 2022-07-29 NOTE — Progress Notes (Signed)
[facility]   [phone] [phone]   Clinic Follow up Note   Patient Care Team: Claiborne Rigg, NP as PCP - General (Nurse Practitioner) Claiborne Rigg, NP as PCP - Family Medicine (Internal Medicine) Malachy Mood, MD as Consulting Physician (Hematology) Claud Kelp, MD as Consulting Physician (General Surgery) Pollyann Samples, NP as Nurse Practitioner (Nurse Practitioner) Antony Blackbird, MD as Consulting Physician (Radiation Oncology) Earl Lites, MD as Referring Physician (Hematology and Oncology) Reece Levy, MD as Referring Physician (Hematology and Oncology)  Date of Service:  07/29/2022  CHIEF COMPLAINT: f/u of MDS   CURRENT THERAPY:   Azacitadine injection, days 1-5, started on 11/15/21  (first cycle 7 days); C2 x4 days, subsequent cycles daily x5 days  Platelet transfusion if platelet count less than 20 K Blood transfusion if hemoglobin less than 8.0    ASSESSMENT:  Amanda Duarte is a 67 y.o. 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  Her FISH test showed 3 abnormal type, her risk of transforming to acute leukemia is very high, likely in the next 6 to 12 months -She started azacitidine injection on November 15, 2021, she was admitted to hospital after chemo, for severe profound  pancytopenia, aspiration pneumonia, required intubation. She has recovered well  -Her blood counts improved after first cycle chemotherapy, likely she responded to treatment. -she started cycle 2 azacitidine on 01/25/2022, will reduce to 4 days for this cycle. If she tolerates well, will change to 5 days from next cyle  -She was evaluated for bone marrow transplant at [facility] and is open to transplant. We recommend continuing chemo for now, and reserve transplant when she progress. She is tolerating Azacitadine well.   -She has been more cytopenic lately, concerning for disease progression.  I held her chemo last week. -Repeated a bone marrow biopsy on May 23, 2022 showed persistent high-grade MDS, with increased blasts 6%, no evidence of AML transformation at this point  -I reviewed her recent worsening pancytopenia with Dr. Rosaria Ferries, and he recommend her to see his leukemia specialist Dr. Sharyne Richters, I referred, appointment still pending    Malignant neoplasm of upper-inner quadrant of left breast in female, estrogen receptor negative (HCC) -cTxN2aM0, G3, ER, PR and HER-2 negative (triple negative), [id number]  -diagnosed in 04/2017, s/p neoadjuvant ddAC-TC, left mastectomy and adjuvant radiation. She had excellent response. -most recent right mammogram on 07/26/21 and right axilla [name] on 10/27/21 were benign. -she is on surveillance now      Other pancytopenia (HCC) -secondary to MDS -will continue prophylactic antibiotics for ANC less than 0.5, and blood transfusion as needed         PLAN: -lab reviewed, will obtain blood and urine culture, give her 1 unit platelet transfusion here  - Pt to  ED for neutropenic fever, patient requires IV antibiotics -f/u as scheduled  SUMMARY OF ONCOLOGIC HISTORY: Oncology History Overview Note  Cancer Staging Malignant neoplasm of upper-inner quadrant of left breast in female, estrogen receptor positive (HCC) Staging form: Breast, AJCC 8th  Edition - Clinical stage from 05/01/2017: Stage Unknown (cTX, cN2, cM0, G3, ER-, PR-, HER2-) - Signed by Malachy Mood, MD on 11/01/2017 - Pathologic stage from 11/20/2017: No Stage Recommended (ypT0, pN47mi, cM0, GX, ER-, PR-, HER2-) - Signed by Malachy Mood, MD on 12/10/2017     Malignant neoplasm of upper-inner quadrant of left breast in female, estrogen receptor negative (HCC)  04/28/2017 Mammogram   IMPRESSION: Two adjacent masses/enlarged lymph nodes in the LOWER LEFT axilla, the largest measuring 2.1 cm. Tissue sampling of 1 of these is recommended to exclude malignancy/lymphoma. No mammographic evidence of breast malignancy bilaterally.    05/01/2017 Initial Biopsy   Diagnosis 05/01/17 Lymph node, needle/core biopsy, low left inferior axillary - METASTATIC POORLY DIFFERENTIATED CARCINOMA TO A LYMPH NODE. SEE NOTE.   05/01/2017 Cancer Staging   Staging form: Breast, AJCC 8th Edition - Clinical stage from 05/01/2017: Stage Unknown (cTX, cN1, cM0, G3, ER-, PR-, HER2-) - Signed by Malachy Mood, MD on 06/02/2017    05/01/2017 Receptors her2   Lymph Node Biopsy:  HER2-Negative  PR-Negative  ER- Negative    05/10/2017 Imaging   MR Breast W WO Contrast 05/10/17 IMPRESSION: No MRI evidence of malignancy in the right breast. Area of week stippled non mass enhancement in the left breast upper inner quadrant. Separate area of thin linear non mass enhancement in the subareolar left breast. Three grossly abnormal left axillary lymph nodes, and more than 4 less than 1 cm indeterminate left axillary lymph nodes. No evidence of right axillary lymphadenopathy.   05/17/2017 Initial Biopsy   Diagnosis 05/17/17 1. 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 2. Breast, left, needle core biopsy, upper inner post MR enhancement - MICROSCOPIC FOCUS OF DUCTAL CARCINOMA IN SITU.   05/17/2017 Receptors her2   Left Breast Biopsy:   ER-Negative PR-Negative HER2-Negative   05/22/2017 Initial Diagnosis   Malignant neoplasm of upper-inner quadrant of left breast in female, estrogen receptor positive (HCC)   06/01/2017 Imaging   Boen Scan 06/01/17 IMPRESSION: No definite scintigraphic evidence of osseous metastatic disease.   Posttraumatic and postsurgical uptake at the LEFT knee.   Nonspecific soft tissue distribution of tracer at the LEFT thigh, could represent contusion, hemorrhage, soft tissue edema, or soft tissue calcifications such as from heterotopic calcification and myositis ossificans; recommend clinical correlation and consider dedicated LEFT femoral radiographs.   Single focus of nonspecific increased tracer localization at the lateral LEFT orbit.     06/01/2017 Imaging   06/01/2017 Bone Scan IMPRESSION: No definite scintigraphic evidence of osseous metastatic disease.   Posttraumatic and postsurgical uptake at the LEFT knee.   Nonspecific soft tissue distribution of tracer at the LEFT thigh, could represent contusion, hemorrhage, soft tissue edema, or soft tissue calcifications such as from heterotopic calcification and myositis ossificans; recommend clinical correlation and consider dedicated LEFT femoral radiographs.   Single focus of nonspecific increased tracer localization at the lateral LEFT orbit.   06/09/2017 -  Chemotherapy   ddAC every 2 weeks for 4 weeks starting 06/09/17-07/21/17 followed by weekly [place] and taxol for 12 weeks 08/04/17-10/20/17.    10/24/2017 Imaging   Breast MRI B/l 10/24/17 IMPRESSION: 1. No residual enhancement in the LEFT breast following neoadjuvant treatment. 2. Significantly smaller LEFT axillary lymph nodes, largest now measuring 1.4 centimeters. The remainder of LEFT  axillary lymph nodes demonstrate normal fatty hila. RECOMMENDATION: Treatment plan for known LEFT breast cancer.   11/20/2017 Cancer Staging   Staging form: Breast, AJCC 8th Edition -  Pathologic stage from 11/20/2017: No Stage Recommended (ypT0, pN49mi, cM0, GX, ER-, PR-, HER2-) - Signed by Malachy Mood, MD on 12/10/2017   11/20/2017 Surgery   Left mastectomy and SLN biopsy by Dr. Derrell Lolling    11/20/2017 Pathology Results   Breast, modified radical mastectomy , left - MICROSCOPIC FOCI OF RESIDUAL METASTATIC CARCINOMA INVOLVING TWO OF EIGHT LYMPH NODES (2/8); LARGEST CONTINUOUS FOCUS MEASURES 0.1 CM. - NO EVIDENCE OF RESIDUAL CARCINOMA IN THE MASTECTOMY SPECIMEN - MARKED THERAPY-RELATED CHANGES, INCLUDING DENSE HYALIN FIBROSIS     11/20/2017 Receptors her2   ER- PR- HER2- (IHC 1+)   01/02/2018 - 02/14/2018 Radiation Therapy   Adjuvant Radiation 01/02/18 - 02/14/18   07/31/2018 Imaging   Baseline DEXA 07/31/18  ASSESSMENT: The BMD measured at AP Spine L1-L4 is 0.973 g/cm2 with a T-score of -1.7.   This patient is considered OSTEOPENIC according to World Health Organization [facility]) criteria. The scan quality is good.   Site Region Measured Date Measured Age YA T-score BMD Significant CHANGE   AP Spine  L1-L4      07/31/2018    63.1         -1.7    0.973 g/cm2   DualFemur Neck Left  07/31/2018    63.1         -1.5    0.828 g/cm2   DualFemur Total Mean 07/31/2018    63.1         0.1     1.016 g/cm2   11/05/2018 Survivorship   Per Santiago Glad, NP    11/15/2021 -  Chemotherapy   Patient is on Treatment Plan : MYELODYSPLASIA  Azacitidine SQ D1-7 q28d     02/03/2022 Imaging    IMPRESSION: No active cardiopulmonary disease. No evidence of pneumonia or pulmonary edema. Possible mild chronic interstitial lung disease.      INTERVAL HISTORY:  Amanda Duarte is here for a follow up of MDS. She was last seen by me on 06/23/2022. She presents to the clinic alone. Pt state that her eye swelling has resolved. Pt state that she was in [facility] for 5 days. Pt state that she is still taking the oral antibiotics at home. Pt also had a fever and and some throat pain. Pt denies  having any cough or other issues. Pt stet that she doesn't have an appetite, but she forces herself to eat.      All other systems were reviewed with the patient and are negative.  MEDICAL HISTORY:  Past Medical History:  Diagnosis Date   Cancer (HCC) 03/2017   left breast cancer   Diabetes mellitus without complication (HCC)    Hyperlipidemia    Hypertension    Malignant neoplasm of left breast (HCC)    Stroke (cerebrum) (HCC)    Tibial plateau fracture, left    04-13-17 had ORIF    SURGICAL HISTORY: Past Surgical History:  Procedure Laterality Date   MASTECTOMY Left 2019   MASTECTOMY MODIFIED RADICAL Left 11/20/2017   Procedure: LEFT MODIFIED RADICAL MASTECTOMY;  Surgeon: Claud Kelp, MD;  Location[facility] OR;  Service: General;  Laterality: Left;   ORIF TIBIA PLATEAU Left 04/13/2017   Procedure: OPEN REDUCTION INTERNAL FIXATION (ORIF) TIBIAL PLATEAU;  Surgeon: Roby Lofts, MD;  Location: [facility] OR;  Service: Orthopedics;  Laterality: Left;   PORT-A-CATH  REMOVAL Right 11/20/2017   Procedure: REMOVAL PORT-A-CATH;  Surgeon: Claud Kelp, MD;  Location: [facility] OR;  Service: General;  Laterality: Right;   PORTACATH PLACEMENT Right 05/31/2017   Procedure: INSERTION PORT-A-CATH;  Surgeon: Claud Kelp, MD;  Location: [facility];  Service: General;  Laterality: Right;    I have reviewed the social history and family history with the patient and they are unchanged from previous note.  ALLERGIES:  has No Known Allergies.  MEDICATIONS:  Current Outpatient Medications  Medication Sig Dispense Refill   acetaminophen (TYLENOL) 325 MG tablet Take 650 mg by mouth every 4 (four) hours as needed for moderate pain.     amLODipine (NORVASC) 5 MG tablet Take 1 tablet (5 mg total) by mouth daily. For blood pressure 90 tablet 1   amoxicillin-clavulanate (AUGMENTIN) 875-125 MG tablet Take 1 tablet by mouth 2 (two) times daily.     atorvastatin (LIPITOR) 40 MG tablet Take 1 tablet  (40 mg total) by mouth daily. For cholesterol 90 tablet 2   Blood Glucose Monitoring Suppl (ONETOUCH VERIO) w/Device KIT Use to check blood sugar three times daily. E11.65 1 kit 0   Blood Pressure Monitor DEVI Please provide patient with insurance approved blood pressure monitor 1 each 0   carboxymethylcellulose (REFRESH PLUS) 0.5 % SOLN Place 1 drop into the right eye 4 (four) times daily.     ceFEPIme 2 g in sodium chloride 0.9 % 100 mL Inject 2 g into the vein every 12 (twelve) hours.     citalopram (CELEXA) 20 MG tablet Take 1 tablet (20 mg total) by mouth daily. For depression (Patient not taking: Reported on 07/09/2022) 90 tablet 3   Continuous Glucose Receiver (FREESTYLE LIBRE READER) DEVI Monitor blood glucose levels 5-6 times per day E11.65 1 each 0   Continuous Glucose Sensor (FREESTYLE LIBRE 2 SENSOR) MISC Monitor blood glucose levels 5-6 times per day E11.65 3 each 6   erythromycin ophthalmic ointment Place 1 Application into the right eye 3 (three) times daily.     fenofibrate (TRICOR) 145 MG tablet Take 1 tablet (145 mg total) by mouth daily. For cholesterol 90 tablet 2   fluticasone (FLONASE) 50 MCG/ACT nasal spray Place 2 sprays into both nostrils daily. (Patient not taking: Reported on 07/09/2022) 16 g 6   glucose blood test strip Use to check blood sugar three times daily. E11.65 100 each 12   HUMALOG KWIKPEN 100 UNIT/ML KwikPen Inject 10 Units into the skin 3 (three) times daily. 15 mL 1   Insulin Glargine (BASAGLAR KWIKPEN) 100 UNIT/ML Inject 20 Units into the skin 2 (two) times daily. 15 mL 2   Insulin Pen Needle 31G X 5 MM MISC Inject 1 Device into the skin QID. For use with insulin pens 200 each 6   Insulin Syringe-Needle U-100 30G X 5/16" 1 ML MISC Use as directed to inject into the skin 3 times daily. 200 each 6   levofloxacin (LEVAQUIN) 500 MG tablet Take 1 tablet (500 mg total) by mouth daily. (Patient not taking: Reported on 07/09/2022) 30 tablet 1   losartan (COZAAR) 50 MG  tablet Take 50 mg by mouth daily.     metroNIDAZOLE (FLAGYL) 500 MG tablet Take 1 tablet (500 mg total) by mouth every 12 (twelve) hours.     ondansetron (ZOFRAN) 8 MG tablet Take 1 tablet (8 mg total) by mouth every 8 (eight) hours as needed for nausea or vomiting. (Patient not taking: Reported on 07/09/2022) 30 tablet 1  OneTouch Delica Lancets 33G MISC Use to check blood sugar three times daily. E11.65 100 each 3   oxyCODONE (OXY IR/ROXICODONE) 5 MG immediate release tablet Take 5 mg by mouth every 4 (four) hours as needed for severe pain. Take 0.5 to 1 tablet (2.5-5 mg total) by mouth every 4 (four) hours as needed for up to 5 days     pantoprazole (PROTONIX) 40 MG tablet Take 1 tablet (40 mg total) by mouth daily at 6 (six) AM. For heartburn (Patient taking differently: Take 40 mg by mouth daily before breakfast.) 90 tablet 1   potassium chloride SA (KLOR-CON M) 20 MEQ tablet Take 1 tablet (20 mEq total) by mouth daily. 30 tablet 3   prochlorperazine (COMPAZINE) 10 MG tablet Take 1 tablet (10 mg total) by mouth every 6 (six) hours as needed for nausea or vomiting. (Patient not taking: Reported on 07/09/2022) 30 tablet 1   sitaGLIPtin-metformin (JANUMET) 50-1000 MG tablet Take 1 tablet by mouth 2 (two) times daily with a meal.     vancomycin (VANCOREADY) 750 MG/150ML SOLN Inject 150 mLs (750 mg total) into the vein daily.     No current facility-administered medications for this visit.   Facility-Administered Medications Ordered in Other Visits  Medication Dose Route Frequency Provider Last Rate Last Admin   heparin lock flush 100 unit/mL  500 Units Intracatheter Once PRN Malachy Mood, MD       sodium chloride flush (NS) 0.9 % injection 10 mL  10 mL Intracatheter PRN Malachy Mood, MD        PHYSICAL EXAMINATION: ECOG PERFORMANCE STATUS: 1 - Symptomatic but completely ambulatory  There were no vitals filed for this visit. Wt Readings from Last 3 Encounters:  07/09/22 121 lb 4.8 oz (55 kg)   07/01/22 118 lb (53.5 kg)  06/30/22 120 lb 12.8 oz (54.8 kg)    LUNGS: (-) clear to auscultation and percussion with normal breathing effort HEART: (-)regular rate & rhythm and no murmurs and no lower extremity edema ABDOMEN:(-)abdomen soft, (-) non-tender and normal bowel sounds  LABORATORY DATA:  I have reviewed the data as listed    Latest Ref Rng & Units 07/29/2022    8:58 AM 07/26/2022    7:48 AM 07/21/2022   12:58 PM  CBC  WBC 4.0 - 10.5 K/uL 1.5  1.3  1.0   Hemoglobin 12.0 - 15.0 g/dL 9.8  6.3  8.2   Hematocrit 36.0 - 46.0 % 28.8  18.4  23.3   Platelets 150 - 400 K/uL 14  18  10          Latest Ref Rng & Units 07/26/2022    7:48 AM 07/13/2022    5:38 AM 07/12/2022    5:36 AM  CMP  Glucose 70 - 99 mg/dL 829  562  130   BUN 8 - 23 mg/dL 14  10  10    Creatinine 0.44 - 1.00 mg/dL 8.65  7.84  6.96   Sodium 135 - 145 mmol/L 131  132  131   Potassium 3.5 - 5.1 mmol/L 4.1  3.7  3.9   Chloride 98 - 111 mmol/L 98  97  99   CO2 22 - 32 mmol/L 27  27  25    Calcium 8.9 - 10.3 mg/dL 8.6  8.7  8.7   Total Protein 6.5 - 8.1 g/dL 7.7     Total Bilirubin 0.3 - 1.2 mg/dL 1.0     Alkaline Phos 38 - 126 U/L 74  AST 15 - 41 U/L 14     ALT 0 - 44 U/L 11         RADIOGRAPHIC STUDIES: I have personally reviewed the radiological images as listed and agreed with the findings in the report. No results found.    No orders of the defined types were placed in this encounter.  All questions were answered. The patient knows to call the clinic with any problems, questions or concerns. No barriers to learning was detected. The total time spent in the appointment was 25 minutes.     Malachy Mood, MD 07/29/2022   Carolin Coy, CMA, am acting as scribe for Malachy Mood, MD.   I have reviewed the above documentation for accuracy and completeness, and I agree with the above.

## 2022-07-29 NOTE — ED Provider Notes (Signed)
3:30 PM Care assumed from Dr. Wilkie Aye.  At time of transfer of care, patient is awaiting laboratory testing prior to calling for admission for suspected neutropenic fever in this cancer patient from the cancer center.  Plan of care will be to give antibiotics and admit after labs are completed.  4:20 PM Patient's labs began to return.  She does indeed appear to have pancytopenia with a white count of 1.3.  Patient's liver function not critically elevated and her kidney function is normal.  CBC showed a platelet of 22 and per the note from oncology they would not transfuse platelets until below 20.  Hemoglobin is 8.4.  Lipase not elevated.  Urinalysis did not show nitrites leukocytes or bacteria, doubt UTI.  X-ray not show pneumonia.  Strep test negative.  Viral testing still in process however due to this neutropenic fever, patient will need admission.  I called and spoke with pharmacy who is determining best antibiotic choice given her previous antibiotics and medical complications.  They requested we start with cefepime and they will let us know if any other broad-spectrum antibiotics added.  Will call medicine for admission.     Amanda Duarte, Canary Brim, MD 07/29/22 [phone]

## 2022-07-29 NOTE — Progress Notes (Signed)
Pt arrived to infusion with oral temp 103.2 and ANC 0.1. Dr. Mosetta Putt notified and orders given to continue with platelet transfusion, obtain UA//UC and blood cultures and then bring to ED for further work up.

## 2022-07-29 NOTE — Progress Notes (Signed)
Informed by infusion nurse Leanne Lovely that pt's temp 103.2.  Dr. Mosetta Putt was notified of pt's elevated temp plus ANC 0.1 today.  Verbal order given w/readback for Bcx2 and UA/UC by Dr. Mosetta Putt.  Dr. Mosetta Putt also gave verbal order for pt to go to ED once plts are infused.  Contacted [facility] ED Charge Nurse Colin Mulders to check for ED bed availability.  Gave report to Ririe regarding pt.  Brianna assigned ED Rm#15 to pt.  Infusion nurse Shana notified of ED Rm assignment.  Edson Snowball stated she will take pt to ED once pt's plts are completed.

## 2022-07-30 DIAGNOSIS — D709 Neutropenia, unspecified: Secondary | ICD-10-CM | POA: Diagnosis not present

## 2022-07-30 DIAGNOSIS — R5081 Fever presenting with conditions classified elsewhere: Secondary | ICD-10-CM | POA: Diagnosis not present

## 2022-07-30 LAB — GLUCOSE, CAPILLARY
Glucose-Capillary: 137 mg/dL — ABNORMAL HIGH (ref 70–99)
Glucose-Capillary: 120 mg/dL — ABNORMAL HIGH (ref 70–99)
Glucose-Capillary: 180 mg/dL — ABNORMAL HIGH (ref 70–99)
Glucose-Capillary: 178 mg/dL — ABNORMAL HIGH (ref 70–99)

## 2022-07-30 LAB — CBC WITH DIFFERENTIAL/PLATELET
Abs Immature Granulocytes: 0.04 10*3/uL (ref 0.00–0.07)
Basophils Absolute: 0 10*3/uL (ref 0.0–0.1)
Basophils Relative: 2 %
Eosinophils Absolute: 0 10*3/uL (ref 0.0–0.5)
Eosinophils Relative: 1 %
HCT: 26.6 % — ABNORMAL LOW (ref 36.0–46.0)
Hemoglobin: 8.9 g/dL — ABNORMAL LOW (ref 12.0–15.0)
Immature Granulocytes: 4 %
Lymphocytes Relative: 73 %
Lymphs Abs: 0.8 10*3/uL (ref 0.7–4.0)
MCH: 28.8 pg (ref 26.0–34.0)
MCHC: 33.5 g/dL (ref 30.0–36.0)
MCV: 86.1 fL (ref 80.0–100.0)
Monocytes Absolute: 0.1 10*3/uL (ref 0.1–1.0)
Monocytes Relative: 13 %
Neutro Abs: 0.1 10*3/uL — CL (ref 1.7–7.7)
Neutrophils Relative %: 7 %
Platelets: 19 10*3/uL — CL (ref 150–400)
RBC: 3.09 MIL/uL — ABNORMAL LOW (ref 3.87–5.11)
RDW: 13.7 % (ref 11.5–15.5)
WBC: 1.1 10*3/uL — CL (ref 4.0–10.5)
nRBC: 2.9 % — ABNORMAL HIGH (ref 0.0–0.2)

## 2022-07-30 LAB — URINE CULTURE

## 2022-07-30 LAB — CREATININE, SERUM
Creatinine, Ser: 0.54 mg/dL (ref 0.44–1.00)
GFR, Estimated: >60 mL/min (ref >60)

## 2022-07-30 LAB — BPAM PLATELET PHERESIS: Blood Product Expiration Date: 202407052359

## 2022-07-30 LAB — PREPARE PLATELET PHERESIS: Unit division: 0

## 2022-07-30 LAB — BASIC METABOLIC PANEL
Anion gap: 8 (ref 5–15)
BUN: 18 mg/dL (ref 8–23)
CO2: 21 mmol/L — ABNORMAL LOW (ref 22–32)
Calcium: 8.2 mg/dL — ABNORMAL LOW (ref 8.9–10.3)
Chloride: 103 mmol/L (ref 98–111)
Creatinine, Ser: 0.57 mg/dL (ref 0.44–1.00)
GFR, Estimated: >60 mL/min (ref >60)
Glucose, Bld: 187 mg/dL — ABNORMAL HIGH (ref 70–99)
Potassium: 3.6 mmol/L (ref 3.5–5.1)
Sodium: 132 mmol/L — ABNORMAL LOW (ref 135–145)

## 2022-07-30 NOTE — Plan of Care (Signed)
  Problem: Education: Goal: Knowledge of General Education information will improve Description: Including pain rating scale, medication(s)/side effects and non-pharmacologic comfort measures Outcome: Not Progressing   Problem: Health Behavior/Discharge Planning: Goal: Ability to manage health-related needs will improve Outcome: Not Progressing   

## 2022-07-30 NOTE — Progress Notes (Signed)
[facility]  PROGRESS NOTE  Amanda Duarte:[id number] DOB: 1957/67/67 DOA: 07/29/2022 PCP: Claiborne Rigg, NP   Brief HPI:   67 y.o. female with medical history significant for treated breast cancer, insulin-dependent type 2 diabetes, myelodysplastic syndrome being admitted to the hospital with neutropenic fever.  She presented to oncology clinic with Dr. Mosetta Putt for routine follow-up, complaint of sore fever but no other symptoms, is currently on azacitadine injection. Today in the oncology clinic she was found to have fever 100.4. Lab work was obtained, showed platelets 14,000 was given 1 unit platelet transfusion in the oncology office and then sent to [facility] ED for evaluation  Chest x-ray with mild peribronchial thickening, UA without evidence of infection. Blood and urine cultures were obtained, she was started empirically on IV vancomycin and IV cefepime.    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, on prophylactic Levaquin at home  Diabetes mellitus type 2 -Continue sliding scale insulin with NovoLog -CBG well-controlled  Thrombocytopenia -Secondary to MDS -Received 1 unit of transfusion in the oncology clinic  Chronic anemia -Likely from MDS -No evidence of bleeding -Follow labs, transfuse for Hb less than 7  Mild dysplastic syndrome, high-grade -Diagnosed in 10/23 -Pulmonary biopsy showed high-grade myeloid neoplasm -Started on azacitidine injection -She was seen at [facility] for bone marrow transplant -Repeat bone marrow biopsy in April 2024 showed persistent high-grade MDS, 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, triple negative -Diagnosed in 4/19 -S/p left mastectomy, neoadjuvant chemotherapy and radiation treatment -Had excellent response -Recent mammogram on 7/23 and right axilla ultrasound on 10/23 were benign -She is on surveillance now, followed by oncology as outpatient   Medications     citalopram  20 mg Oral Daily   insulin aspart  0-15 Units Subcutaneous TID WC   insulin aspart  0-5 Units Subcutaneous QHS   insulin glargine-yfgn  20 Units Subcutaneous BID   losartan  50 mg Oral Daily   pantoprazole  40 mg Oral QAC breakfast     Data Reviewed:   CBG:  Recent Labs  Lab 07/29/22 1752 07/29/22 2203 07/30/22 0727 07/30/22 1125  GLUCAP 162* 211* 137* 120*    SpO2: 96 %    Vitals:   07/29/22 2014 07/29/22 2205 07/30/22 0207 07/30/22 1016  BP: (!) 154/70 (!) 122/54 (!) 114/56 (!) 120/59  Pulse: 99 90 74 78  Resp: 20 20 20 17   Temp: (!) 103.1 F (39.5 C) (!) 100.5 F (38.1 C) 99 F (37.2 C) 98.8 F (37.1 C)  TempSrc:  Oral Oral Oral  SpO2: 99% 98% 96% 96%  Weight:      Height:          Data Reviewed:  Basic Metabolic Panel: Recent Labs  Lab 07/26/22 0748 07/29/22 1438 07/30/22 0412 07/30/22 1010  NA 131* 134*  --  132*  K 4.1 3.2*  --  3.6  CL 98 103  --  103  CO2 27 24  --  21*  GLUCOSE 186* 205*  --  187*  BUN 14 17  --  18  CREATININE 0.59 0.58 0.54 0.57  CALCIUM 8.6* 8.2*  --  8.2*  MG  --  1.5*  --   --     CBC: Recent Labs  Lab 07/26/22 0748 07/29/22 0858 07/29/22 1438 07/30/22 1010  WBC 1.3* 1.5* 1.3* 1.1*  NEUTROABS 0.3* 0.1* 0.1* 0.1*  HGB 6.3* 9.8* 8.4* 8.9*  HCT 18.4* 28.8* 24.9* 26.6*  MCV 85.2 83.5 84.4 86.1  PLT 18* 14* 22* 19*    LFT Recent Labs  Lab 07/26/22 0748 07/29/22 1438  AST 14* 13*  ALT 11 12  ALKPHOS 74 61  BILITOT 1.0 0.5  PROT 7.7 7.8  ALBUMIN 3.3* 3.0*     Antibiotics: Anti-infectives  (From admission, onward)    Start     Dose/Rate Route Frequency Ordered Stop   07/30/22 1800  vancomycin (VANCOREADY) IVPB 750 mg/150 mL        750 mg 150 mL/hr over 60 Minutes Intravenous Every 24 hours 07/29/22 1825     07/30/22 0500  ceFEPIme (MAXIPIME) 2 g in sodium chloride 0.9 % 100 mL IVPB        2 g 200 mL/hr over 30 Minutes Intravenous Every 12 hours 07/29/22 1825     07/29/22 1630  ceFEPIme (MAXIPIME) 2 g in sodium chloride 0.9 % 100 mL IVPB        2 g 200 mL/hr over 30 Minutes Intravenous  Once 07/29/22 1617 07/29/22 1818   07/29/22 1630  vancomycin (VANCOCIN) IVPB 1000 mg/200 mL premix        1,000 mg 200 mL/hr over 60 Minutes Intravenous  Once 07/29/22 1628 07/30/22 0749        DVT prophylaxis: SCDs  Code Status: Full code  Family Communication: No family at bedside   CONSULTS    Subjective   Complains of chills last night   Objective    Physical Examination:   General-appears in no acute distress Heart-S1-S2, regular, no murmur auscultated Lungs-clear to auscultation bilaterally, no wheezing or crackles auscultated Abdomen-soft, nontender, no organomegaly Extremities-no edema in the lower extremities Neuro-alert, oriented x3, no focal deficit noted   Status is: Inpatient:             Meredeth Ide   [facility] If 7PM-7AM, please contact night-coverage at [email or web address], Office  [phone]   07/30/2022, 3:13 PM  LOS: 1 day

## 2022-07-31 DIAGNOSIS — R5081 Fever presenting with conditions classified elsewhere: Secondary | ICD-10-CM | POA: Diagnosis not present

## 2022-07-31 DIAGNOSIS — D709 Neutropenia, unspecified: Secondary | ICD-10-CM | POA: Diagnosis not present

## 2022-07-31 LAB — COMPREHENSIVE METABOLIC PANEL
ALT: 13 U/L (ref 0–44)
AST: 17 U/L (ref 15–41)
Albumin: 3.2 g/dL — ABNORMAL LOW (ref 3.5–5.0)
Alkaline Phosphatase: 65 U/L (ref 38–126)
Anion gap: 8 (ref 5–15)
BUN: 13 mg/dL (ref 8–23)
CO2: 20 mmol/L — ABNORMAL LOW (ref 22–32)
Calcium: 8.3 mg/dL — ABNORMAL LOW (ref 8.9–10.3)
Chloride: 102 mmol/L (ref 98–111)
Creatinine, Ser: 0.54 mg/dL (ref 0.44–1.00)
GFR, Estimated: >60 mL/min (ref >60)
Glucose, Bld: 123 mg/dL — ABNORMAL HIGH (ref 70–99)
Potassium: 3.8 mmol/L (ref 3.5–5.1)
Sodium: 130 mmol/L — ABNORMAL LOW (ref 135–145)
Total Bilirubin: 0.6 mg/dL (ref 0.3–1.2)
Total Protein: 8.2 g/dL — ABNORMAL HIGH (ref 6.5–8.1)

## 2022-07-31 LAB — CULTURE, BLOOD (ROUTINE X 2): Culture: NO GROWTH

## 2022-07-31 LAB — CBC
HCT: 27.1 % — ABNORMAL LOW (ref 36.0–46.0)
Hemoglobin: 9 g/dL — ABNORMAL LOW (ref 12.0–15.0)
MCH: 28.2 pg (ref 26.0–34.0)
MCHC: 33.2 g/dL (ref 30.0–36.0)
MCV: 85 fL (ref 80.0–100.0)
Platelets: 13 10*3/uL — CL (ref 150–400)
RBC: 3.19 MIL/uL — ABNORMAL LOW (ref 3.87–5.11)
RDW: 13.3 % (ref 11.5–15.5)
WBC: 0.7 10*3/uL — CL (ref 4.0–10.5)
nRBC: 5.4 % — ABNORMAL HIGH (ref 0.0–0.2)

## 2022-07-31 LAB — URINE CULTURE: Culture: 10000 — AB

## 2022-07-31 LAB — GLUCOSE, CAPILLARY
Glucose-Capillary: 67 mg/dL — ABNORMAL LOW (ref 70–99)
Glucose-Capillary: 175 mg/dL — ABNORMAL HIGH (ref 70–99)
Glucose-Capillary: 83 mg/dL (ref 70–99)
Glucose-Capillary: 191 mg/dL — ABNORMAL HIGH (ref 70–99)

## 2022-07-31 MED ORDER — TBO-FILGRASTIM 300 MCG/0.5ML ~~LOC~~ SOSY
300.0000 ug | PREFILLED_SYRINGE | Freq: Once | SUBCUTANEOUS | Status: AC
  Administered 2022-07-31: 300 ug via SUBCUTANEOUS
  Filled 2022-07-31: qty 0.5

## 2022-07-31 NOTE — Progress Notes (Signed)
[facility]  PROGRESS NOTE  Amanda Duarte:[id number] DOB: 28-Nov-1955 DOA: 07/29/2022 PCP: Claiborne Rigg, NP   Brief HPI:   67 y.o. female with medical history significant for treated breast cancer, insulin-dependent type 2 diabetes, myelodysplastic syndrome being admitted to the hospital with neutropenic fever.  She presented to oncology clinic with Dr. Mosetta Putt for routine follow-up, complaint of sore fever but no other symptoms, is currently on azacitadine injection. Today in the oncology clinic she was found to have fever 100.4. Lab work was obtained, showed platelets 14,000 was given 1 unit platelet transfusion in the oncology office and then sent to [facility] ED for evaluation  Chest x-ray with mild peribronchial thickening, UA without evidence of infection. Blood and urine cultures were obtained, she was started empirically on IV vancomycin and IV cefepime.    Assessment/Plan:    Neutropenic fever -No clear source of infection identified -Recently treated for facial cellulitis -Chest x-ray showed mild peribronchial thickening -Currently on vancomycin and cefepime empirically -Blood cultures x 2 are negative to date,and urine culture growing E coli -Vancomycin has been discontinued  UTI -Urine culture grew E. 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 Bertis Ruddy  Diabetes mellitus type 2 -Continue sliding scale insulin with NovoLog -CBG well-controlled  Thrombocytopenia -Secondary to MDS -Received 1 unit of transfusion in the oncology clinic -Platelet count is 13,000, will transfuse platelet if evidence of bleeding  Hyponatremia -Sodium is 130 -Chronic has been low for past 2 months -Unclear etiology -Follow serum sodium in a.m.  Chronic anemia -Likely from MDS -No evidence of bleeding -Follow labs,  transfuse for Hb less than 7  Mild dysplastic syndrome, high-grade -Diagnosed in 10/23 -Pulmonary biopsy showed high-grade myeloid neoplasm -Started on azacitidine injection -She was seen at [facility] for bone marrow transplant -Repeat bone marrow biopsy in April 2024 showed persistent high-grade MDS, 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, triple negative -Diagnosed in 4/19 -S/p left mastectomy, neoadjuvant chemotherapy and radiation treatment -Had excellent response -Recent mammogram on 7/23 and right axilla ultrasound on 10/23 were benign -She is on surveillance now, followed by oncology as outpatient   Medications     citalopram  20 mg Oral Daily   insulin aspart  0-15 Units Subcutaneous TID WC   insulin aspart  0-5 Units Subcutaneous QHS   insulin glargine-yfgn  20 Units Subcutaneous BID   losartan  50 mg Oral Daily   pantoprazole  40 mg Oral QAC breakfast     Data Reviewed:   CBG:  Recent Labs  Lab 07/30/22 0727 07/30/22 1125 07/30/22 1723 07/30/22 2228 07/31/22 0807  GLUCAP 137* 120* 180* 178* 67*    SpO2: 97 %    Vitals:   07/31/22 0339 07/31/22 0505 07/31/22 0621 07/31/22 0918  BP:   (!) 112/52 (!) 109/54  Pulse:   66 70  Resp:   19 18  Temp: 99.8 F (37.7 C) 98.6 F (37 C) 98.5 F (36.9 C) 98.1 F (36.7 C)  TempSrc: Oral Oral Oral   SpO2:   97% 97%  Weight:      Height:          Data Reviewed:  Basic Metabolic Panel: Recent Labs  Lab  07/26/22 0748 07/29/22 1438 07/30/22 0412 07/30/22 1010 07/31/22 0332  NA 131* 134*  --  132* 130*  K 4.1 3.2*  --  3.6 3.8  CL 98 103  --  103 102  CO2 27 24  --  21* 20*  GLUCOSE 186* 205*  --  187* 123*  BUN 14 17  --  18 13  CREATININE 0.59 0.58 0.54 0.57 0.54  CALCIUM 8.6* 8.2*  --  8.2* 8.3*  MG  --  1.5*  --   --    --     CBC: Recent Labs  Lab 07/26/22 0748 07/29/22 0858 07/29/22 1438 07/30/22 1010 07/31/22 0332  WBC 1.3* 1.5* 1.3* 1.1* 0.7*  NEUTROABS 0.3* 0.1* 0.1* 0.1*  --   HGB 6.3* 9.8* 8.4* 8.9* 9.0*  HCT 18.4* 28.8* 24.9* 26.6* 27.1*  MCV 85.2 83.5 84.4 86.1 85.0  PLT 18* 14* 22* 19* 13*    LFT Recent Labs  Lab 07/26/22 0748 07/29/22 1438 07/31/22 0332  AST 14* 13* 17  ALT 11 12 13   ALKPHOS 74 61 65  BILITOT 1.0 0.5 0.6  PROT 7.7 7.8 8.2*  ALBUMIN 3.3* 3.0* 3.2*     Antibiotics: Anti-infectives (From admission, onward)    Start     Dose/Rate Route Frequency Ordered Stop   07/30/22 1800  vancomycin (VANCOREADY) IVPB 750 mg/150 mL  Status:  Discontinued        750 mg 150 mL/hr over 60 Minutes Intravenous Every 24 hours 07/29/22 1825 07/31/22 0924   07/30/22 0500  ceFEPIme (MAXIPIME) 2 g in sodium chloride 0.9 % 100 mL IVPB        2 g 200 mL/hr over 30 Minutes Intravenous Every 12 hours 07/29/22 1825     07/29/22 1630  ceFEPIme (MAXIPIME) 2 g in sodium chloride 0.9 % 100 mL IVPB        2 g 200 mL/hr over 30 Minutes Intravenous  Once 07/29/22 1617 07/29/22 1818   07/29/22 1630  vancomycin (VANCOCIN) IVPB 1000 mg/200 mL premix        1,000 mg 200 mL/hr over 60 Minutes Intravenous  Once 07/29/22 1628 07/30/22 0749        DVT prophylaxis: SCDs  Code Status: Full code  Family Communication: No family at bedside   CONSULTS    Subjective   Denies dysuria.  No pain.  WBC down to 0.7.  Communicated with the help of the online  interpreter  Objective    Physical Examination:  Appears in no acute distress S1-S2, regular Lungs clear to auscultation bilaterally Abdomen is soft, nontender, organomegaly    Status is: Inpatient:             Meredeth Ide   [facility] If 7PM-7AM, please contact night-coverage at [email or web address], Office  [phone]   07/31/2022, 9:26 AM  LOS: 2 days

## 2022-08-01 ENCOUNTER — Inpatient Hospital Stay: Payer: Medicare Other

## 2022-08-01 ENCOUNTER — Other Ambulatory Visit: Payer: Medicare Other

## 2022-08-01 DIAGNOSIS — R5081 Fever presenting with conditions classified elsewhere: Secondary | ICD-10-CM | POA: Diagnosis not present

## 2022-08-01 DIAGNOSIS — D709 Neutropenia, unspecified: Secondary | ICD-10-CM | POA: Diagnosis not present

## 2022-08-01 LAB — COMPREHENSIVE METABOLIC PANEL WITH GFR
ALT: 13 U/L (ref 0–44)
AST: 20 U/L (ref 15–41)
Albumin: 3 g/dL — ABNORMAL LOW (ref 3.5–5.0)
Alkaline Phosphatase: 72 U/L (ref 38–126)
Anion gap: 10 (ref 5–15)
BUN: 15 mg/dL (ref 8–23)
CO2: 22 mmol/L (ref 22–32)
Calcium: 8.6 mg/dL — ABNORMAL LOW (ref 8.9–10.3)
Chloride: 99 mmol/L (ref 98–111)
Creatinine, Ser: 0.57 mg/dL (ref 0.44–1.00)
GFR, Estimated: >60 mL/min
Glucose, Bld: 241 mg/dL — ABNORMAL HIGH (ref 70–99)
Potassium: 3.9 mmol/L (ref 3.5–5.1)
Sodium: 131 mmol/L — ABNORMAL LOW (ref 135–145)
Total Bilirubin: 0.4 mg/dL (ref 0.3–1.2)
Total Protein: 8.1 g/dL (ref 6.5–8.1)

## 2022-08-01 LAB — BPAM PLATELET PHERESIS
Blood Product Expiration Date: 202407112359
ISSUE DATE / TIME: 202407081827

## 2022-08-01 LAB — GLUCOSE, CAPILLARY
Glucose-Capillary: 108 mg/dL — ABNORMAL HIGH (ref 70–99)
Glucose-Capillary: 181 mg/dL — ABNORMAL HIGH (ref 70–99)
Glucose-Capillary: 316 mg/dL — ABNORMAL HIGH (ref 70–99)
Glucose-Capillary: 239 mg/dL — ABNORMAL HIGH (ref 70–99)

## 2022-08-01 LAB — CBC
HCT: 25.3 % — ABNORMAL LOW (ref 36.0–46.0)
Hemoglobin: 8.5 g/dL — ABNORMAL LOW (ref 12.0–15.0)
MCH: 28.3 pg (ref 26.0–34.0)
MCHC: 33.6 g/dL (ref 30.0–36.0)
MCV: 84.3 fL (ref 80.0–100.0)
Platelets: 8 10*3/uL — CL (ref 150–400)
RBC: 3 MIL/uL — ABNORMAL LOW (ref 3.87–5.11)
RDW: 13.2 % (ref 11.5–15.5)
WBC: 1.1 10*3/uL — CL (ref 4.0–10.5)
nRBC: 3.5 % — ABNORMAL HIGH (ref 0.0–0.2)

## 2022-08-01 LAB — CULTURE, BLOOD (ROUTINE X 2)

## 2022-08-01 LAB — PREPARE PLATELET PHERESIS

## 2022-08-01 MED ORDER — SODIUM CHLORIDE 0.9 % IV SOLN
2.0000 g | INTRAVENOUS | Status: DC
  Administered 2022-08-02: 2 g via INTRAVENOUS
  Filled 2022-08-01: qty 20

## 2022-08-01 MED ORDER — SODIUM CHLORIDE 0.9% IV SOLUTION: Freq: Once | INTRAVENOUS | Status: AC

## 2022-08-01 MED ORDER — SODIUM CHLORIDE 0.9 % IV SOLN
100.0000 mg | INTRAVENOUS | Status: DC
  Administered 2022-08-01 – 2022-08-10 (×10): 100 mg via INTRAVENOUS
  Filled 2022-08-01 (×10): qty 5

## 2022-08-01 NOTE — Progress Notes (Addendum)
Amanda Duarte   DOB:December 21, 1955   ZO#:[id number]   JWJ#:[id number]   Heme-onc follow-up note  Subjective: Patient is well-known to me, under my care for her high risk MDS.  She is on palliative chemotherapy.  She has required frequent blood transfusion.  She was recently hospitalized for right periorbital cellulitis.  She was sent to emergency room from my office 3 days ago due to neutropenic fever.  Last episode of fever was earlier this morning, she still feels weak, with low appetite.  No pain or productive cough.  Objective:  Vitals:   08/01/22 0515 08/01/22 1337  BP: (!) 131/58 (!) 146/55  Pulse:  92  Resp: 18 18  Temp: (!) 100.4 F (38 C) 99.8 F (37.7 C)  SpO2: 92% 100%    Body mass index is 22.42 kg/m.  Intake/Output Summary (Last 24 hours) at 08/01/2022 1819 Last data filed at 08/01/2022 1300 Gross per 24 hour  Intake 705 ml  Output --  Net 705 ml     Sclerae unicteric, (+) edema and skin erythema around the right eye  Oropharynx clear  No peripheral adenopathy  Lungs clear -- no rales or rhonchi  Heart regular rate and rhythm  Abdomen benign  MSK no focal spinal tenderness, no peripheral edema  Neuro nonfocal    CBG (last 3)  Recent Labs    08/01/22 0800 08/01/22 1156 08/01/22 1608  GLUCAP 108* 181* 316*     Labs:   Urine Studies No results for input(s): "UHGB", "CRYS" in the last 72 hours.  Invalid input(s): "UACOL", "UAPR", "USPG", "UPH", "UTP", "UGL", "UKET", "UBIL", "UNIT", "UROB", "ULEU", "UEPI", "UWBC", "URBC", "UBAC", "CAST", "UCOM", "BILUA"  Basic Metabolic Panel: Recent Labs  Lab 07/26/22 0748 07/29/22 1438 07/30/22 0412 07/30/22 1010 07/31/22 0332 08/01/22 0322  NA 131* 134*  --  132* 130* 131*  K 4.1 3.2*  --  3.6 3.8 3.9  CL 98 103  --  103 102 99  CO2 27 24  --  21* 20* 22  GLUCOSE 186* 205*  --  187* 123* 241*  BUN 14 17  --  18 13 15   CREATININE 0.59 0.58 0.54 0.57 0.54 0.57  CALCIUM 8.6* 8.2*  --  8.2* 8.3* 8.6*  MG  --  1.5*  --    --   --   --    GFR Estimated Creatinine Clearance: 49 mL/min (by C-G formula based on SCr of 0.57 mg/dL). Liver Function Tests: Recent Labs  Lab 07/26/22 0748 07/29/22 1438 07/31/22 0332 08/01/22 0322  AST 14* 13* 17 20  ALT 11 12 13 13   ALKPHOS 74 61 65 72  BILITOT 1.0 0.5 0.6 0.4  PROT 7.7 7.8 8.2* 8.1  ALBUMIN 3.3* 3.0* 3.2* 3.0*   Recent Labs  Lab 07/29/22 1438  LIPASE 29   No results for input(s): "AMMONIA" in the last 168 hours. Coagulation profile No results for input(s): "INR", "PROTIME" in the last 168 hours.  CBC: Recent Labs  Lab 07/26/22 0748 07/29/22 0858 07/29/22 1438 07/30/22 1010 07/31/22 0332 08/01/22 0322  WBC 1.3* 1.5* 1.3* 1.1* 0.7* 1.1*  NEUTROABS 0.3* 0.1* 0.1* 0.1*  --   --   HGB 6.3* 9.8* 8.4* 8.9* 9.0* 8.5*  HCT 18.4* 28.8* 24.9* 26.6* 27.1* 25.3*  MCV 85.2 83.5 84.4 86.1 85.0 84.3  PLT 18* 14* 22* 19* 13* 8*   Cardiac Enzymes: No results for input(s): "CKTOTAL", "CKMB", "CKMBINDEX", "TROPONINI" in the last 168 hours. BNP: Invalid input(s): "POCBNP" CBG: Recent  Labs  Lab 07/31/22 1717 07/31/22 2137 08/01/22 0800 08/01/22 1156 08/01/22 1608  GLUCAP 83 191* 108* 181* 316*   D-Dimer No results for input(s): "DDIMER" in the last 72 hours. Hgb A1c No results for input(s): "HGBA1C" in the last 72 hours.  Lipid Profile No results for input(s): "CHOL", "HDL", "LDLCALC", "TRIG", "CHOLHDL", "LDLDIRECT" in the last 72 hours. Thyroid function studies No results for input(s): "TSH", "T4TOTAL", "T3FREE", "THYROIDAB" in the last 72 hours.  Invalid input(s): "FREET3" Anemia work up No results for input(s): "VITAMINB12", "FOLATE", "FERRITIN", "TIBC", "IRON", "RETICCTPCT" in the last 72 hours. Microbiology Recent Results (from the past 240 hour(s))  Culture, blood (Routine X 2) w Reflex to ID Panel     Status: None (Preliminary result)   Collection Time: 07/29/22 10:12 AM   Specimen: BLOOD  Result Value Ref Range Status   Specimen  Description   Final    BLOOD Performed at [facility], [address], [place], [place] [address]    Special Requests   Final    BLOOD Performed at [facility], [address], [place], [place] [address]    Culture   Final    NO GROWTH 3 DAYS Performed at [facility], [address], [place], [place] [address]    Report Status PENDING  Incomplete  Culture, blood (Routine X 2) w Reflex to ID Panel     Status: None (Preliminary result)   Collection Time: 07/29/22 10:14 AM   Specimen: BLOOD  Result Value Ref Range Status   Specimen Description   Final    BLOOD Performed at [facility], [address], [place], [place] [address]    Special Requests   Final    BLOOD Performed at [facility], [address], [place], [place] [address]    Culture   Final    NO GROWTH 3 DAYS Performed at [facility], [address], [place], [place] [address]    Report Status PENDING  Incomplete  Urine Culture     Status: Abnormal   Collection Time: 07/29/22 10:38 AM   Specimen: Urine, Random  Result Value Ref Range Status   Specimen Description   Final    URINE, RANDOM Performed at [facility], [address] [place], [place] [address]    Special Requests   Final    NONE Performed at [facility], [address], [place], [place] [address]    Culture 10,000 COLONIES/mL ESCHERICHIA COLI (A)  Final   Report Status 07/31/2022 FINAL  Final   Organism ID, Bacteria ESCHERICHIA COLI (A)  Final      Susceptibility   Escherichia coli - MIC*    AMPICILLIN <=2 SENSITIVE Sensitive     CEFAZOLIN <=4 SENSITIVE Sensitive     CEFEPIME <=0.12 SENSITIVE Sensitive     CEFTRIAXONE <=0.25 SENSITIVE Sensitive     CIPROFLOXACIN <=0.25 SENSITIVE Sensitive     GENTAMICIN <=1 SENSITIVE Sensitive     IMIPENEM <=0.25 SENSITIVE Sensitive      NITROFURANTOIN <=16 SENSITIVE Sensitive     TRIMETH/SULFA <=20 SENSITIVE Sensitive     AMPICILLIN/SULBACTAM <=2 SENSITIVE Sensitive     PIP/TAZO <=4 SENSITIVE Sensitive     * 10,000 COLONIES/mL ESCHERICHIA COLI  Resp panel by RT-PCR (RSV, Flu A&B, Covid) Anterior Nasal Swab     Status: None   Collection Time: 07/29/22  2:39 PM   Specimen:  Anterior Nasal Swab  Result Value Ref Range Status   SARS Coronavirus 2 by RT PCR NEGATIVE NEGATIVE Final    Comment: (NOTE) SARS-CoV-2 target nucleic acids are NOT DETECTED.  The SARS-CoV-2 RNA is generally detectable in upper respiratory specimens during the acute phase of infection. The lowest concentration of SARS-CoV-2 viral copies this assay can detect is 138 copies/mL. A negative result does not preclude SARS-Cov-2 infection and should not be used as the sole basis for treatment or other patient management decisions. A negative result may occur with  improper specimen collection/handling, submission of specimen other than nasopharyngeal swab, presence of viral mutation(s) within the areas targeted by this assay, and inadequate number of viral copies(<138 copies/mL). A negative result must be combined with clinical observations, patient history, and epidemiological information. The expected result is Negative.  Fact Sheet for Patients:  BloggerCourse.com  Fact Sheet for Healthcare Providers:  SeriousBroker.it  This test is no t yet approved or cleared by the [place] FDA and  has been authorized for detection and/or diagnosis of SARS-CoV-2 by FDA under an Emergency Use Authorization (EUA). This EUA will remain  in effect (meaning this test can be used) for the duration of the COVID-19 declaration under Section 564(b)(1) of the Act, 21 U.S.C.section 360bbb-3(b)(1), unless the authorization is terminated  or revoked sooner.       Influenza A by PCR NEGATIVE NEGATIVE Final    Influenza B by PCR NEGATIVE NEGATIVE Final    Comment: (NOTE) The Xpert Xpress SARS-CoV-2/FLU/RSV plus assay is intended as an aid in the diagnosis of influenza from Nasopharyngeal swab specimens and should not be used as a sole basis for treatment. Nasal washings and aspirates are unacceptable for Xpert Xpress SARS-CoV-2/FLU/RSV testing.  Fact Sheet for Patients: BloggerCourse.com  Fact Sheet for Healthcare Providers: SeriousBroker.it  This test is not yet approved or cleared by the [place] FDA and has been authorized for detection and/or diagnosis of SARS-CoV-2 by FDA under an Emergency Use Authorization (EUA). This EUA will remain in effect (meaning this test can be used) for the duration of the COVID-19 declaration under Section 564(b)(1) of the Act, 21 U.S.C. section 360bbb-3(b)(1), unless the authorization is terminated or revoked.     Resp Syncytial Virus by PCR NEGATIVE NEGATIVE Final    Comment: (NOTE) Fact Sheet for Patients: BloggerCourse.com  Fact Sheet for Healthcare Providers: SeriousBroker.it  This test is not yet approved or cleared by the [place] FDA and has been authorized for detection and/or diagnosis of SARS-CoV-2 by FDA under an Emergency Use Authorization (EUA). This EUA will remain in effect (meaning this test can be used) for the duration of the COVID-19 declaration under Section 564(b)(1) of the Act, 21 U.S.C. section 360bbb-3(b)(1), unless the authorization is terminated or revoked.  Performed at [facility], [address] [place], [place] [address]   Group A Strep by PCR     Status: None   Collection Time: 07/29/22  2:40 PM   Specimen: Throat; Sterile Swab  Result Value Ref Range Status   Group A Strep by PCR NOT DETECTED NOT DETECTED Final    Comment: Performed at [facility], [address] [place], [place] [address]      Studies:  No results found.  Assessment: 67 y.o. female  UTI with E coli  Chronic severe neutropenia from MDS Anemia and severe thrombocytopenia from MDS High-grade MDS, on chemotherapy azacitidine Type 2 diabetes, uncontrolled hyperglycemia Hypertension    Plan:  -  She is on IV cefepime, urine culture showed E. coli which is pansensitive.  Her blood culture has been negative. -Despite appropriate antibiotics, she had recurrent fever earlier this morning.  Due to her chronic and severe neutropenia, I wonder if she needs empirical average for other infections, such as fungal infection.  I recommend ID consult to weight in -I had a very lengthy discussion with patient and her daughter Amanda Duarte today.  Patient has been declining with frequent infections, severe worsening neutropenia and thrombocytopenia required transfusion weekly.  I have clinical suspicions that her disease has progressed although previous bone marrow biopsy did not show acute leukemia.  I have referred her to [facility] to see leukemia specialist Dr. Sharyne Richters, she has appointment in August, I will contact him to see if we can move up her appointment to next few weeks. -We discussed the option of aggressive treatment, such as leukemia regiment, versus more conservative management with supportive care alone, since she is not responding to azacitidine much lately.  We discussed the option of palliative care and hospice if she decides not to try aggressive chemotherapy.  -Patient's daughter understands the severity of her illness, I doubt patient understands fully, despite repeated explanation.  She is unsure what she would like to do, she agrees to see Dr. Sharyne Richters then make a decision after. -We also discussed DNR, she will think about it. -blood transfusion if hemoglobin less than 7.5, and platelet transfusion if platelet less than 15 K -discharge plan per primary team and ID. I very much  appreciate their help.  I spent a total of 50 minutes for her visit today.  Malachy Mood, MD 08/01/2022  6:19 PM

## 2022-08-01 NOTE — Progress Notes (Signed)
[facility]  PROGRESS NOTE  MARVELOUS OETJEN ZOX:[id number] DOB: Jun 08, 1955 DOA: 07/29/2022 PCP: Claiborne Rigg, NP   Brief HPI:   67 y.o. female with medical history significant for treated breast cancer, insulin-dependent type 2 diabetes, myelodysplastic syndrome being admitted to the hospital with neutropenic fever.  She presented to oncology clinic with Dr. Mosetta Putt for routine follow-up, complaint of sore fever but no other symptoms, is currently on azacitadine injection. Today in the oncology clinic she was found to have fever 100.4. Lab work was obtained, showed platelets 14,000 was given 1 unit platelet transfusion in the oncology office and then sent to [facility] ED for evaluation  Chest x-ray with mild peribronchial thickening, UA without evidence of infection. Blood and urine cultures were obtained, she was started empirically on IV vancomycin and IV cefepime.    Assessment/Plan:    Neutropenic fever -Recently treated for facial cellulitis -Chest x-ray showed mild peribronchial thickening -Urine culture grew E. coli, pansensitive -Currently on vancomycin and cefepime empirically -Blood cultures x 2 are negative to date,and urine culture growing E coli -Vancomycin was discontinued, continue cefepime  UTI -Urine culture grew E. 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 Bertis Ruddy -WBC up to 1.1  Diabetes mellitus type 2 -Continue sliding scale insulin with NovoLog -CBG well-controlled  Thrombocytopenia -Secondary to MDS -Received 1 unit of transfusion in the oncology clinic -Platelet count is 8000, will transfuse platelet  -Discussed with Dr. Parke Poisson  Hyponatremia -Sodium is 131 -Chronic has been low for past 2 months -Unclear etiology -Follow serum sodium in a.m.  Chronic anemia -Likely from MDS -No  evidence of bleeding -Follow labs, transfuse for Hb less than 7  Mild dysplastic syndrome, high-grade -Diagnosed in 10/23 -Pulmonary biopsy showed high-grade myeloid neoplasm -Started on azacitidine injection -She was seen at [facility] for bone marrow transplant -Repeat bone marrow biopsy in April 2024 showed persistent high-grade MDS, 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, triple negative -Diagnosed in 4/19 -S/p left mastectomy, neoadjuvant chemotherapy and radiation treatment -Had excellent response -Recent mammogram on 7/23 and right axilla ultrasound on 10/23 were benign -She is on surveillance now, followed by oncology as outpatient   Medications     citalopram  20 mg Oral Daily   insulin aspart  0-15 Units Subcutaneous TID WC   insulin aspart  0-5 Units Subcutaneous QHS   insulin glargine-yfgn  20 Units Subcutaneous BID   losartan  50 mg Oral Daily   pantoprazole  40 mg Oral QAC breakfast     Data Reviewed:   CBG:  Recent Labs  Lab 07/31/22 1717 07/31/22 2137 08/01/22 0800 08/01/22 1156 08/01/22 1608  GLUCAP 83 191* 108* 181* 316*    SpO2: 100 %    Vitals:   07/31/22 1325 07/31/22 2136 08/01/22 0515 08/01/22 1337  BP: (!) 123/52 (!) 117/53 (!) 131/58 (!) 146/55  Pulse: 72 88  92  Resp: 16 16 18 18   Temp: 99.8 F (37.7 C) 99.9 F (37.7 C) (!) 100.4 F (38 C) 99.8 F (37.7 C)  TempSrc: Oral Oral Oral Oral  SpO2: 97% 95% 92% 100%  Weight:      Height:  Data Reviewed:  Basic Metabolic Panel: Recent Labs  Lab 07/26/22 0748 07/29/22 1438 07/30/22 0412 07/30/22 1010 07/31/22 0332 08/01/22 0322  NA 131* 134*  --  132* 130* 131*  K 4.1 3.2*  --  3.6 3.8 3.9  CL 98 103  --  103 102 99  CO2 27 24  --  21* 20* 22  GLUCOSE 186* 205*  --  187* 123* 241*  BUN 14  17  --  18 13 15   CREATININE 0.59 0.58 0.54 0.57 0.54 0.57  CALCIUM 8.6* 8.2*  --  8.2* 8.3* 8.6*  MG  --  1.5*  --   --   --   --     CBC: Recent Labs  Lab 07/26/22 0748 07/29/22 0858 07/29/22 1438 07/30/22 1010 07/31/22 0332 08/01/22 0322  WBC 1.3* 1.5* 1.3* 1.1* 0.7* 1.1*  NEUTROABS 0.3* 0.1* 0.1* 0.1*  --   --   HGB 6.3* 9.8* 8.4* 8.9* 9.0* 8.5*  HCT 18.4* 28.8* 24.9* 26.6* 27.1* 25.3*  MCV 85.2 83.5 84.4 86.1 85.0 84.3  PLT 18* 14* 22* 19* 13* 8*    LFT Recent Labs  Lab 07/26/22 0748 07/29/22 1438 07/31/22 0332 08/01/22 0322  AST 14* 13* 17 20  ALT 11 12 13 13   ALKPHOS 74 61 65 72  BILITOT 1.0 0.5 0.6 0.4  PROT 7.7 7.8 8.2* 8.1  ALBUMIN 3.3* 3.0* 3.2* 3.0*     Antibiotics: Anti-infectives (From admission, onward)    Start     Dose/Rate Route Frequency Ordered Stop   07/30/22 1800  vancomycin (VANCOREADY) IVPB 750 mg/150 mL  Status:  Discontinued        750 mg 150 mL/hr over 60 Minutes Intravenous Every 24 hours 07/29/22 1825 07/31/22 0924   07/30/22 0500  ceFEPIme (MAXIPIME) 2 g in sodium chloride 0.9 % 100 mL IVPB        2 g 200 mL/hr over 30 Minutes Intravenous Every 12 hours 07/29/22 1825     07/29/22 1630  ceFEPIme (MAXIPIME) 2 g in sodium chloride 0.9 % 100 mL IVPB        2 g 200 mL/hr over 30 Minutes Intravenous  Once 07/29/22 1617 07/29/22 1818   07/29/22 1630  vancomycin (VANCOCIN) IVPB 1000 mg/200 mL premix        1,000 mg 200 mL/hr over 60 Minutes Intravenous  Once 07/29/22 1628 07/30/22 0749        DVT prophylaxis: SCDs  Code Status: Full code  Family Communication: No family at bedside   CONSULTS    Subjective   Denies any complaints.  Communicated with the help of interpreter at bedside  Objective    Physical Examination:  General-appears in no acute distress Heart-S1-S2, regular, no murmur auscultated Lungs-clear to auscultation bilaterally, no wheezing or crackles auscultated Abdomen-soft, nontender, no  organomegaly Extremities-no edema in the lower extremities Neuro-alert, oriented x3, no focal deficit noted    Status is: Inpatient:             Meredeth Ide   [facility] If 7PM-7AM, please contact night-coverage at [email or web address], Office  [phone]   08/01/2022, 4:20 PM  LOS: 3 days

## 2022-08-01 NOTE — Progress Notes (Signed)
Dr. Mosetta Putt recommended to get ID input for persistent fever with neutropenia.  Called and discussed with Dr. Jerolyn Center, he recommends starting antifungal IV micafungin 100 mg every 24 hours and narrow down to ceftriaxone for pansensitive E. coli in the urine.  Will continue with cefepime tonight and switch to ceftriaxone from tomorrow morning.  IV micafungin has been started.  Patient was recently treated for right periorbital cellulitis Will obtain CT orbits with contrast

## 2022-08-01 NOTE — Progress Notes (Addendum)
Pharmacy Antibiotic Note  Amanda Duarte is a 67 y.o. female admitted on 07/29/2022 with fever. Patient with sore throat. 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 She has been on prophylactic levofloxacin. Pharmacy has been consulted for vancomycin and cefepime dosing.  Today, 08/01/2022: D3 full cefepime; vanc stopped 7/7 Continues to spike fevers (100.72F this AM), although improved from previous WBC remains low but improved after filgrastim x1 ANC likely improved as well, but no differential processed since 7/6 Urine growing 10k E coli; asymptomatic and unremarkable UA, but difficult to rule out infection in setting of FN SCr stable <0.8; baseline CrCl <60 mL/min  Plan: Continue Cefepime 2 g IV q12h for CrCl 30-60 ml/min Pharmacy will follow peripherally for dose adjustments and clinical changes Consider narrowing to cover E coli once ANC recovers and fevers subside (assuming no other sources of infection identified)   Height: 5' (152.4 cm) Weight: 52.1 kg (114 lb 12.8 oz) IBW/kg (Calculated) : 45.5  Temp (24hrs), Avg:99.6 F (37.6 C), Min:98.1 F (36.7 C), Max:100.4 F (38 C)  Recent Labs  Lab 07/29/22 0858 07/29/22 1438 07/30/22 0412 07/30/22 1010 07/31/22 0332 08/01/22 0322  WBC 1.5* 1.3*  --  1.1* 0.7* 1.1*  CREATININE  --  0.58 0.54 0.57 0.54 0.57     Estimated Creatinine Clearance: 49 mL/min (by C-G formula based on SCr of 0.57 mg/dL).    No Known Allergies  Antimicrobials this admission: Vancomycin 7/5 >> 7/7 Cefepime 7/5 >>   Dose adjustments this admission:  N/a   Microbiology results:  7/5 BCx (at [facility]): ngtd 7/5 UCx (at [facility]): 10K E.coli  7/5: Resp panel: neg 7/5 Group A Strep: neg  Thank you for allowing pharmacy to be a part of this patient's care.  Emojean Gertz, Deirdre Evener, PharmD, BCPS Clinical Pharmacist 08/01/2022 8:16 AM

## 2022-08-02 ENCOUNTER — Encounter: Payer: Medicare Other | Admitting: *Deleted

## 2022-08-02 ENCOUNTER — Inpatient Hospital Stay (HOSPITAL_COMMUNITY): Payer: Medicare Other

## 2022-08-02 ENCOUNTER — Ambulatory Visit: Payer: Self-pay | Admitting: *Deleted

## 2022-08-02 ENCOUNTER — Encounter (HOSPITAL_COMMUNITY): Payer: Self-pay | Admitting: Internal Medicine

## 2022-08-02 DIAGNOSIS — R9431 Abnormal electrocardiogram [ECG] [EKG]: Secondary | ICD-10-CM

## 2022-08-02 DIAGNOSIS — R5081 Fever presenting with conditions classified elsewhere: Secondary | ICD-10-CM | POA: Diagnosis not present

## 2022-08-02 DIAGNOSIS — D696 Thrombocytopenia, unspecified: Secondary | ICD-10-CM

## 2022-08-02 DIAGNOSIS — Z853 Personal history of malignant neoplasm of breast: Secondary | ICD-10-CM

## 2022-08-02 DIAGNOSIS — D709 Neutropenia, unspecified: Secondary | ICD-10-CM | POA: Diagnosis not present

## 2022-08-02 DIAGNOSIS — D61818 Other pancytopenia: Secondary | ICD-10-CM

## 2022-08-02 LAB — CBC WITH DIFFERENTIAL/PLATELET
Abs Immature Granulocytes: 0.11 10*3/uL — ABNORMAL HIGH (ref 0.00–0.07)
Basophils Absolute: 0 10*3/uL (ref 0.0–0.1)
Basophils Relative: 1 %
Eosinophils Absolute: 0 10*3/uL (ref 0.0–0.5)
Eosinophils Relative: 0 %
HCT: 21.4 % — ABNORMAL LOW (ref 36.0–46.0)
Hemoglobin: 7.2 g/dL — ABNORMAL LOW (ref 12.0–15.0)
Immature Granulocytes: 11 %
Lymphocytes Relative: 58 %
Lymphs Abs: 0.6 10*3/uL — ABNORMAL LOW (ref 0.7–4.0)
MCH: 28.5 pg (ref 26.0–34.0)
MCHC: 33.6 g/dL (ref 30.0–36.0)
MCV: 84.6 fL (ref 80.0–100.0)
Monocytes Absolute: 0.1 10*3/uL (ref 0.1–1.0)
Monocytes Relative: 9 %
Neutro Abs: 0.2 10*3/uL — CL (ref 1.7–7.7)
Neutrophils Relative %: 21 %
Platelets: 17 10*3/uL — CL (ref 150–400)
RBC: 2.53 MIL/uL — ABNORMAL LOW (ref 3.87–5.11)
RDW: 13.2 % (ref 11.5–15.5)
WBC: 1 10*3/uL — CL (ref 4.0–10.5)
nRBC: 2.9 % — ABNORMAL HIGH (ref 0.0–0.2)

## 2022-08-02 LAB — PREPARE PLATELET PHERESIS: Unit division: 0

## 2022-08-02 LAB — BPAM PLATELET PHERESIS: Unit Type and Rh: 7300

## 2022-08-02 LAB — CRYPTOCOCCAL ANTIGEN: Crypto Ag: NEGATIVE

## 2022-08-02 LAB — STREP PNEUMONIAE URINARY ANTIGEN: Strep Pneumo Urinary Antigen: NEGATIVE

## 2022-08-02 LAB — GLUCOSE, CAPILLARY
Glucose-Capillary: 197 mg/dL — ABNORMAL HIGH (ref 70–99)
Glucose-Capillary: 226 mg/dL — ABNORMAL HIGH (ref 70–99)
Glucose-Capillary: 272 mg/dL — ABNORMAL HIGH (ref 70–99)
Glucose-Capillary: 146 mg/dL — ABNORMAL HIGH (ref 70–99)

## 2022-08-02 LAB — CULTURE, BLOOD (ROUTINE X 2): Culture: NO GROWTH

## 2022-08-02 MED ORDER — VANCOMYCIN HCL 750 MG/150ML IV SOLN
750.0000 mg | INTRAVENOUS | Status: DC
  Administered 2022-08-02 – 2022-08-10 (×9): 750 mg via INTRAVENOUS
  Filled 2022-08-02 (×10): qty 150

## 2022-08-02 MED ORDER — IOHEXOL 300 MG/ML  SOLN
75.0000 mL | Freq: Once | INTRAMUSCULAR | Status: AC | PRN
  Administered 2022-08-02: 75 mL via INTRAVENOUS

## 2022-08-02 MED ORDER — IOHEXOL 300 MG/ML  SOLN
100.0000 mL | Freq: Once | INTRAMUSCULAR | Status: AC | PRN
  Administered 2022-08-02: 100 mL via INTRAVENOUS

## 2022-08-02 MED ORDER — METRONIDAZOLE 500 MG PO TABS
500.0000 mg | ORAL_TABLET | Freq: Two times a day (BID) | ORAL | Status: DC
  Administered 2022-08-02 – 2022-08-04 (×4): 500 mg via ORAL
  Filled 2022-08-02 (×4): qty 1

## 2022-08-02 MED ORDER — IOHEXOL 9 MG/ML PO SOLN
ORAL | Status: AC
  Filled 2022-08-02: qty 1000

## 2022-08-02 MED ORDER — SODIUM CHLORIDE (PF) 0.9 % IJ SOLN
INTRAMUSCULAR | Status: AC
  Filled 2022-08-02: qty 50

## 2022-08-02 MED ORDER — IOHEXOL 9 MG/ML PO SOLN
500.0000 mL | ORAL | Status: AC
  Administered 2022-08-02 (×2): 500 mL via ORAL

## 2022-08-02 MED ORDER — SODIUM CHLORIDE 0.9 % IV SOLN
2.0000 g | Freq: Two times a day (BID) | INTRAVENOUS | Status: DC
  Administered 2022-08-02 – 2022-08-04 (×4): 2 g via INTRAVENOUS
  Filled 2022-08-02 (×4): qty 12.5

## 2022-08-02 NOTE — Consult Note (Signed)
Date of Admission:  07/29/2022          Reason for Consult: Neutropenic fevers   Referring Provider: Mauro Kaufmann, MD   Assessment:  Hx of breast cancer MDS  Sp azacitidine injection on November 15, 2021 complicated by  severe profound pancytopenia with fevers, intubation, sp broad spectrum antibiotics QT prolongation on voriconazole Admission in June 2024 was thought to be orbital cellulitis and ultimately transferred to [facility] I believe due to concerns that she might have an invasive mold infection.  I do not have this to her records at [facility] due to some issue with EMR it sounds as if she had an occluded tear duct that required iron I&D and she was only in the hospital for a few days night later seen in follow-up Worsening cytopenias worse recently with chemotherapy held now Admission with febrile neutropenia  Plan:  Would obtain CT chest abdomen pelvis I have ordered glucan and galactomannan, serum cryptococcal antigen and urine histoplasma antigen and will ensure legionella ag and pneumococcal ag have been sent Would broaden her neutropenic fever antibiotics with change of ceftriaxone to cefepime, flagyl, vancomycin and micafungin We really need to obtain records from [facility] Followup for any further pertinent clinical diagnostic clues  Principal Problem:   Neutropenic fever (HCC)   Scheduled Meds:  citalopram  20 mg Oral Daily   insulin aspart  0-15 Units Subcutaneous TID WC   insulin aspart  0-5 Units Subcutaneous QHS   insulin glargine-yfgn  20 Units Subcutaneous BID   iohexol  500 mL Oral Q1H   losartan  50 mg Oral Daily   pantoprazole  40 mg Oral QAC breakfast   Continuous Infusions:  cefTRIAXone (ROCEPHIN)  IV 2 g (08/02/22 1230)   micafungin (MYCAMINE) 100 mg in sodium chloride 0.9 % 100 mL IVPB Stopped (08/01/22 2233)   PRN Meds:.acetaminophen **OR** acetaminophen, acetaminophen, albuterol, ondansetron **OR** ondansetron (ZOFRAN) IV,  oxyCODONE, traZODone  HPI: Amanda Duarte is a 67 y.o. female who is Spanish-speaking has history of breast cancer and developed high-grade myelodysplastic syndrome status post azacitidine injection on November 15, 2021, she was admitted to hospital after chemo, for severe profound pancytopenia, aspiration pneumonia, required intubation.  Saw her during that admission and were not convinced he had a clear explanation terms of causation of her fever when she was neutropenic though her neutropenia and fevers did resolve.  She did develop some apparent QTc prolongation while on empiric voriconazole  Patient then tarted 2 cycles of reduced azacitidine  on 01/25/2022. She was evaluted at [facility] for possible bone marrow transplant. There was concern for disease patient given worsening cytopenias.  Repeat bone marrow biopsy on May 23, 2022 showed persistent high-grade dysplastic syndrome with increased blasts.  Patient was referred to see Dr. Sharyne Richters  In the interim and on prophylactic levofloxacin.  She then was hospitalized in June but was thought to be a periorbital colitis and potential ostiomeatal sinusitis based on imaging.  She was given broad-spectrum bacterial therapy form of vancomycin cefepime and metronidazole.  She was seen by ophthalmology here who recommended transfer to [facility] where the patient be seen by ophthalmology and also ENT.  The patient was sent to [facility] but unfortunate I cannot see the records in care everywhere. I suspect that she has more than one MRN either in our system or that of [facility].   Talking to the patient and her daughter speaks [place] fluently  and with the iPad interpreter it appears that when the patient was at [facility] she had a tear duct that was occluded that required I&D but that does not sound like there was any evidence of a invasive fungal infection.  They said that she stayed about 3 days in the hospital was reevaluated in outpatient with  evaluation of the patency of her tear ducts.  Back to her chemotherapy twice a week was recently held due to worsening cytopenia.   In the interim she developed fevers though without other focal symptoms.  She was seen oncology and found to be febrile to 100.4.  Her platelets dropped to 14,000 she was given a platelet transfusion chest x-ray showed some peribronchial thickening.  Done in the ER showed her to be fairly severely leukopenic with a white count of 1.3 and platelets of 22,000.  Checks x-ray showed some mild peribronchial thickening she does not have urinary symptoms and UA did not show pyuria blood cultures were taken as were urine cultures--latter should nto have been taken.  The orbits was performed which showed resolution of what was felt to be orbital cellulitis on imaging last month but with continued paranasal sinus inflammation now more bilateral  She has been started on ceftriaxone and micafungin.  I will obtain a CT chest abdomen pelvis.  I would broaden her antibiotics to vancomycin cefepime metronidazole and micafungin. I have sent beta-glucan, galactomannan, cryptococcal antigen and histoplasma antigen urine, ensuring that pneumococcal and Legionella antigens have been sent as well.  I considered using Zosyn for her gram-negative as well as anaerobic coverage to reduce number of antibiotics but this 1 can give a false positive galactomannan assay if it sent while the patient is on Zosyn.  I have personally spent 84 minutes involved in face-to-face and non-face-to-face activities for this patient on the day of the visit. Professional time spent includes the following activities: Preparing to see the patient (review of tests), Obtaining and/or reviewing separately obtained history (admission/discharge record), Performing a medically appropriate examination and/or evaluation , Ordering medications/tests/procedures, referring and communicating with other health care  professionals, Documenting clinical information in the EMR, Independently interpreting results (not separately reported), Communicating results to the patient/family/caregiver, Counseling and educating the patient/family/caregiver and Care coordination (not separately reported).     I could have Review of Systems: Review of Systems  Constitutional:  Positive for fever. Negative for chills, malaise/fatigue and weight loss.  HENT:  Negative for congestion and sore throat.   Eyes:  Negative for blurred vision and photophobia.  Respiratory:  Negative for cough, shortness of breath and wheezing.   Cardiovascular:  Negative for chest pain, palpitations and leg swelling.  Gastrointestinal:  Negative for abdominal pain, blood in stool, constipation, diarrhea, heartburn, melena, nausea and vomiting.  Genitourinary:  Negative for dysuria, flank pain and hematuria.  Musculoskeletal:  Negative for back pain, falls, joint pain and myalgias.  Skin:  Negative for itching and rash.  Neurological:  Negative for dizziness, focal weakness, loss of consciousness, weakness and headaches.  Endo/Heme/Allergies:  Does not bruise/bleed easily.  Psychiatric/Behavioral:  Negative for depression and suicidal ideas. The patient does not have insomnia.     Past Medical History:  Diagnosis Date   Cancer (HCC) 03/2017   left breast cancer   Diabetes mellitus without complication (HCC)    Hyperlipidemia    Hypertension    Malignant neoplasm of left breast (HCC)    Stroke (cerebrum) (HCC)    Tibial plateau fracture, left  04-13-17 had ORIF    Social History   Tobacco Use   Smoking status: Never   Smokeless tobacco: Never  Vaping Use   Vaping Use: Never used  Substance Use Topics   Alcohol use: No   Drug use: No    Family History  Problem Relation Age of Onset   Heart Problems Mother    Diabetes Son    Breast cancer Neg Hx    Colon cancer Neg Hx    Rectal cancer Neg Hx    Stomach cancer Neg Hx     Esophageal cancer Neg Hx    No Known Allergies  OBJECTIVE: Blood pressure (!) 122/45, pulse 99, temperature 99.9 F (37.7 C), temperature source Oral, resp. rate 20, height 5' (1.524 m), weight 52.1 kg, SpO2 94 %.  Physical Exam Constitutional:      General: She is not in acute distress.    Appearance: Normal appearance. She is well-developed. She is not ill-appearing or diaphoretic.  HENT:     Head: Normocephalic and atraumatic.     Right Ear: Hearing and external ear normal.     Left Ear: Hearing and external ear normal.     Nose: No nasal deformity or rhinorrhea.  Eyes:     General: No scleral icterus.    Conjunctiva/sclera: Conjunctivae normal.     Right eye: Right conjunctiva is not injected.     Left eye: Left conjunctiva is not injected.     Pupils: Pupils are equal, round, and reactive to light.  Neck:     Vascular: No JVD.  Cardiovascular:     Rate and Rhythm: Normal rate and regular rhythm.     Heart sounds: Normal heart sounds, S1 normal and S2 normal. No murmur heard.    No friction rub.  Pulmonary:     Effort: No respiratory distress.     Breath sounds: No stridor. No wheezing, rhonchi or rales.  Chest:     Chest wall: No tenderness.  Abdominal:     General: Bowel sounds are normal. There is no distension.     Palpations: Abdomen is soft. There is no mass.     Tenderness: There is no abdominal tenderness.     Hernia: No hernia is present.  Musculoskeletal:        General: Normal range of motion.     Right shoulder: Normal.     Left shoulder: Normal.     Cervical back: Normal range of motion and neck supple.     Right hip: Normal.     Left hip: Normal.     Right knee: Normal.     Left knee: Normal.  Lymphadenopathy:     Head:     Right side of head: No submandibular, preauricular or posterior auricular adenopathy.     Left side of head: No submandibular, preauricular or posterior auricular adenopathy.     Cervical: No cervical adenopathy.     Right  cervical: No superficial or deep cervical adenopathy.    Left cervical: No superficial or deep cervical adenopathy.  Skin:    General: Skin is warm and dry.     Coloration: Skin is not pale.     Findings: No abrasion, bruising, ecchymosis, erythema, lesion or rash.     Nails: There is no clubbing.  Neurological:     General: No focal deficit present.     Mental Status: She is alert and oriented to person, place, and time.     Sensory: No sensory  deficit.     Coordination: Coordination normal.     Gait: Gait normal.  Psychiatric:        Attention and Perception: She is attentive.        Mood and Affect: Mood normal.        Speech: Speech normal.        Behavior: Behavior normal. Behavior is cooperative.        Thought Content: Thought content normal.        Judgment: Judgment normal.     Lab Results Lab Results  Component Value Date   WBC 1.1 (LL) 08/01/2022   HGB 8.5 (L) 08/01/2022   HCT 25.3 (L) 08/01/2022   MCV 84.3 08/01/2022   PLT 8 (LL) 08/01/2022    Lab Results  Component Value Date   CREATININE 0.57 08/01/2022   BUN 15 08/01/2022   NA 131 (L) 08/01/2022   K 3.9 08/01/2022   CL 99 08/01/2022   CO2 22 08/01/2022    Lab Results  Component Value Date   ALT 13 08/01/2022   AST 20 08/01/2022   ALKPHOS 72 08/01/2022   BILITOT 0.4 08/01/2022     Microbiology: Recent Results (from the past 240 hour(s))  Culture, blood (Routine X 2) w Reflex to ID Panel     Status: None (Preliminary result)   Collection Time: 07/29/22 10:12 AM   Specimen: BLOOD  Result Value Ref Range Status   Specimen Description   Final    BLOOD Performed at [facility], [address], [place], [place] [address]    Special Requests   Final    BLOOD Performed at [facility], [address] [place], [place] [address]    Culture   Final    NO GROWTH 4 DAYS Performed at [facility], [address], [place], [place] [address]     Report Status PENDING  Incomplete  Culture, blood (Routine X 2) w Reflex to ID Panel     Status: None (Preliminary result)   Collection Time: 07/29/22 10:14 AM   Specimen: BLOOD  Result Value Ref Range Status   Specimen Description   Final    BLOOD Performed at [facility], [address], [place], [place] [address]    Special Requests   Final    BLOOD Performed at [facility], [address] [place], [place] [address]    Culture   Final    NO GROWTH 4 DAYS Performed at [facility], [address], [place], [place] [address]    Report Status PENDING  Incomplete  Urine Culture     Status: Abnormal   Collection Time: 07/29/22 10:38 AM   Specimen: Urine, Random  Result Value Ref Range Status   Specimen Description   Final    URINE, RANDOM Performed at [facility], [address] [place], [place] [address]    Special Requests   Final    NONE Performed at [facility], [address], [place], [place] [address]    Culture 10,000 COLONIES/mL ESCHERICHIA COLI (A)  Final   Report Status 07/31/2022 FINAL  Final   Organism ID, Bacteria ESCHERICHIA COLI (A)  Final      Susceptibility   Escherichia coli - MIC*    AMPICILLIN <=2 SENSITIVE Sensitive     CEFAZOLIN <=4 SENSITIVE Sensitive     CEFEPIME <=0.12 SENSITIVE Sensitive  CEFTRIAXONE <=0.25 SENSITIVE Sensitive     CIPROFLOXACIN <=0.25 SENSITIVE Sensitive     GENTAMICIN <=1 SENSITIVE Sensitive     IMIPENEM <=0.25 SENSITIVE Sensitive     NITROFURANTOIN <=16 SENSITIVE Sensitive     TRIMETH/SULFA <=20 SENSITIVE Sensitive     AMPICILLIN/SULBACTAM <=2 SENSITIVE Sensitive     PIP/TAZO <=4 SENSITIVE Sensitive     * 10,000 COLONIES/mL ESCHERICHIA COLI  Resp panel by RT-PCR (RSV, Flu A&B, Covid) Anterior Nasal Swab     Status: None   Collection Time: 07/29/22  2:39 PM   Specimen: Anterior Nasal Swab  Result Value Ref Range  Status   SARS Coronavirus 2 by RT PCR NEGATIVE NEGATIVE Final    Comment: (NOTE) SARS-CoV-2 target nucleic acids are NOT DETECTED.  The SARS-CoV-2 RNA is generally detectable in upper respiratory specimens during the acute phase of infection. The lowest concentration of SARS-CoV-2 viral copies this assay can detect is 138 copies/mL. A negative result does not preclude SARS-Cov-2 infection and should not be used as the sole basis for treatment or other patient management decisions. A negative result may occur with  improper specimen collection/handling, submission of specimen other than nasopharyngeal swab, presence of viral mutation(s) within the areas targeted by this assay, and inadequate number of viral copies(<138 copies/mL). A negative result must be combined with clinical observations, patient history, and epidemiological information. The expected result is Negative.  Fact Sheet for Patients:  BloggerCourse.com  Fact Sheet for Healthcare Providers:  SeriousBroker.it  This test is no t yet approved or cleared by the [place] FDA and  has been authorized for detection and/or diagnosis of SARS-CoV-2 by FDA under an Emergency Use Authorization (EUA). This EUA will remain  in effect (meaning this test can be used) for the duration of the COVID-19 declaration under Section 564(b)(1) of the Act, 21 U.S.C.section 360bbb-3(b)(1), unless the authorization is terminated  or revoked sooner.       Influenza A by PCR NEGATIVE NEGATIVE Final   Influenza B by PCR NEGATIVE NEGATIVE Final    Comment: (NOTE) The Xpert Xpress SARS-CoV-2/FLU/RSV plus assay is intended as an aid in the diagnosis of influenza from Nasopharyngeal swab specimens and should not be used as a sole basis for treatment. Nasal washings and aspirates are unacceptable for Xpert Xpress SARS-CoV-2/FLU/RSV testing.  Fact Sheet for  Patients: BloggerCourse.com  Fact Sheet for Healthcare Providers: SeriousBroker.it  This test is not yet approved or cleared by the [place] FDA and has been authorized for detection and/or diagnosis of SARS-CoV-2 by FDA under an Emergency Use Authorization (EUA). This EUA will remain in effect (meaning this test can be used) for the duration of the COVID-19 declaration under Section 564(b)(1) of the Act, 21 U.S.C. section 360bbb-3(b)(1), unless the authorization is terminated or revoked.     Resp Syncytial Virus by PCR NEGATIVE NEGATIVE Final    Comment: (NOTE) Fact Sheet for Patients: BloggerCourse.com  Fact Sheet for Healthcare Providers: SeriousBroker.it  This test is not yet approved or cleared by the [place] FDA and has been authorized for detection and/or diagnosis of SARS-CoV-2 by FDA under an Emergency Use Authorization (EUA). This EUA will remain in effect (meaning this test can be used) for the duration of the COVID-19 declaration under Section 564(b)(1) of the Act, 21 U.S.C. section 360bbb-3(b)(1), unless the authorization is terminated or revoked.  Performed at [facility], [address], [place], [place] [address]   Group A Strep by PCR  Status: None   Collection Time: 07/29/22  2:40 PM   Specimen: Throat; Sterile Swab  Result Value Ref Range Status   Group A Strep by PCR NOT DETECTED NOT DETECTED Final    Comment: Performed at [facility], [address] [place], [place] [address]    Acey Lav, MD [facility] Infectious Disease [facility] [phone] pager  08/02/2022, 2:23 PM

## 2022-08-02 NOTE — Progress Notes (Signed)
Translator tablet used to perform assessment, educate and review medications, assess pain and address any questions. [employer] [facility] [phone].

## 2022-08-02 NOTE — Progress Notes (Signed)
[facility]  PROGRESS NOTE  Amanda Duarte ZOX:[id number] DOB: 1955/06/13 DOA: 07/29/2022 PCP: Claiborne Rigg, NP   Brief HPI:   67 y.o. female with medical history significant for treated breast cancer, insulin-dependent type 2 diabetes, myelodysplastic syndrome being admitted to the hospital with neutropenic fever.  She presented to oncology clinic with Dr. Mosetta Putt for routine follow-up, complaint of sore fever but no other symptoms, is currently on azacitadine injection. Today in the oncology clinic she was found to have fever 100.4. Lab work was obtained, showed platelets 14,000 was given 1 unit platelet transfusion in the oncology office and then sent to [facility] ED for evaluation  Chest x-ray with mild peribronchial thickening, UA without evidence of infection. Blood and urine cultures were obtained, she was started empirically on IV vancomycin and IV cefepime.    Assessment/Plan:    Neutropenic fever -Recently treated for facial cellulitis -Chest x-ray showed mild peribronchial thickening -Urine culture grew E. coli, pansensitive -Currently on vancomycin and cefepime empirically -Blood cultures x 2 are negative to date,and urine culture growing E coli -Vancomycin was discontinued, cefepime was changed to ceftriaxone -After discussion with ID Dr. Ilsa Iha, patient was started on micafungin -CT orbits was again obtained which shows resolution of periorbital cellulitis -ID has been consulted to see patient  UTI -Urine culture grew E. 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 Bertis Ruddy -WBC up to 1.1 -Recheck CBC today  Diabetes mellitus type 2 -Continue sliding scale insulin with NovoLog -CBG well-controlled  Thrombocytopenia -Secondary to MDS -Received 1 unit of transfusion in the oncology clinic -Platelet  count is 8000, will transfuse platelet  -Discussed with Dr. Parke Poisson -Follow CBC today  Hyponatremia -Sodium is 131 -Chronic has been low for past 2 months -Unclear etiology -Follow serum sodium in a.m.  Chronic anemia -Likely from MDS -No evidence of bleeding -Follow labs, transfuse for Hb less than 7  Mild dysplastic syndrome, high-grade -Diagnosed in 10/23 -Pulmonary biopsy showed high-grade myeloid neoplasm -Started on azacitidine injection -She was seen at [facility] for bone marrow transplant -Repeat bone marrow biopsy in April 2024 showed persistent high-grade MDS, 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, triple negative -Diagnosed in 4/19 -S/p left mastectomy, neoadjuvant chemotherapy and radiation treatment -Had excellent response -Recent mammogram on 7/23 and right axilla ultrasound on 10/23 were benign -She is on surveillance now, followed by oncology as outpatient   Medications     citalopram  20 mg Oral Daily   insulin aspart  0-15 Units Subcutaneous TID WC   insulin aspart  0-5 Units Subcutaneous QHS   insulin glargine-yfgn  20 Units Subcutaneous BID   losartan  50 mg Oral Daily   metroNIDAZOLE  500 mg Oral Q12H   pantoprazole  40 mg Oral QAC breakfast     Data Reviewed:   CBG:  Recent Labs  Lab 08/01/22 1156 08/01/22 1608 08/01/22 2124 08/02/22 0837 08/02/22 1134  GLUCAP 181* 316* 239* 197* 226*    SpO2: 94 %    Vitals:   08/02/22 0618 08/02/22 0843 08/02/22 1138 08/02/22 1350  BP:  (!) 119/38 (!) 132/43 (!) 122/45  Pulse:  76 (!) 101 99  Resp:   20  Temp: 99.9 F (37.7 C) 98.8 F (37.1 C) (!) 102.9 F (39.4 C) 99.9 F (37.7 C)  TempSrc: Oral Oral Oral Oral  SpO2:  99% 98% 94%  Weight:      Height:          Data Reviewed:  Basic Metabolic Panel: Recent  Labs  Lab 07/29/22 1438 07/30/22 0412 07/30/22 1010 07/31/22 0332 08/01/22 0322  NA 134*  --  132* 130* 131*  K 3.2*  --  3.6 3.8 3.9  CL 103  --  103 102 99  CO2 24  --  21* 20* 22  GLUCOSE 205*  --  187* 123* 241*  BUN 17  --  18 13 15   CREATININE 0.58 0.54 0.57 0.54 0.57  CALCIUM 8.2*  --  8.2* 8.3* 8.6*  MG 1.5*  --   --   --   --     CBC: Recent Labs  Lab 07/29/22 0858 07/29/22 1438 07/30/22 1010 07/31/22 0332 08/01/22 0322  WBC 1.5* 1.3* 1.1* 0.7* 1.1*  NEUTROABS 0.1* 0.1* 0.1*  --   --   HGB 9.8* 8.4* 8.9* 9.0* 8.5*  HCT 28.8* 24.9* 26.6* 27.1* 25.3*  MCV 83.5 84.4 86.1 85.0 84.3  PLT 14* 22* 19* 13* 8*    LFT Recent Labs  Lab 07/29/22 1438 07/31/22 0332 08/01/22 0322  AST 13* 17 20  ALT 12 13 13   ALKPHOS 61 65 72  BILITOT 0.5 0.6 0.4  PROT 7.8 8.2* 8.1  ALBUMIN 3.0* 3.2* 3.0*     Antibiotics: Anti-infectives (From admission, onward)    Start     Dose/Rate Route Frequency Ordered Stop   08/02/22 1700  metroNIDAZOLE (FLAGYL) tablet 500 mg        500 mg Oral Every 12 hours 08/02/22 1446     08/02/22 1600  ceFEPIme (MAXIPIME) 2 g in sodium chloride 0.9 % 100 mL IVPB        2 g 200 mL/hr over 30 Minutes Intravenous Every 12 hours 08/02/22 1506     08/02/22 1600  vancomycin (VANCOREADY) IVPB 750 mg/150 mL        750 mg 150 mL/hr over 60 Minutes Intravenous Every 24 hours 08/02/22 1520     08/02/22 1000  cefTRIAXone (ROCEPHIN) 2 g in sodium chloride 0.9 % 100 mL IVPB  Status:  Discontinued        2 g 200 mL/hr over 30 Minutes Intravenous Every 24 hours 08/01/22 1841 08/02/22 1506   08/01/22 2000  micafungin (MYCAMINE) 100 mg in sodium chloride 0.9 % 100 mL IVPB        100 mg 105 mL/hr over 1 Hours Intravenous Every 24 hours 08/01/22 1835     07/30/22 1800  vancomycin (VANCOREADY) IVPB 750 mg/150 mL  Status:  Discontinued        750 mg 150 mL/hr over 60 Minutes Intravenous Every 24 hours 07/29/22 1825 07/31/22 0924   07/30/22 0500  ceFEPIme  (MAXIPIME) 2 g in sodium chloride 0.9 % 100 mL IVPB        2 g 200 mL/hr over 30 Minutes Intravenous Every 12 hours 07/29/22 1825 08/01/22 2130   07/29/22 1630  ceFEPIme (MAXIPIME) 2 g in sodium chloride 0.9 % 100 mL IVPB        2 g 200 mL/hr over 30 Minutes Intravenous  Once 07/29/22 1617 07/29/22 1818   07/29/22 1630  vancomycin (VANCOCIN) IVPB 1000 mg/200 mL premix        1,000 mg  200 mL/hr over 60 Minutes Intravenous  Once 07/29/22 1628 07/30/22 0749        DVT prophylaxis: SCDs  Code Status: Full code  Family Communication: No family at bedside   CONSULTS    Subjective   Denies dysuria.  Still spiking fever with Tmax 102.9 last night.  Communicated with the help of online interpreter  Objective    Physical Examination:  General-appears in no acute distress Heart-S1-S2, regular, no murmur auscultated Lungs-clear to auscultation bilaterally Abdomen-soft, nontender, no organomegaly Extremities-no edema in the lower extremities Neuro-alert, oriented x3, no focal deficit noted    Status is: Inpatient:             Meredeth Ide   [facility] If 7PM-7AM, please contact night-coverage at [email or web address], Office  [phone]   08/02/2022, 4:00 PM  LOS: 4 days

## 2022-08-02 NOTE — Progress Notes (Signed)
Pharmacy Antibiotic Note  OMNI STROHMAIER is a 67 y.o. female admitted on 07/29/2022 with fever. Patient with sore throat. 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 Recent admission for orbital cellulitis.  ID consulted and antibiotics today broadened to cover neutropenic fever.  Pharmacy has been consulted for vancomycin and cefepime dosing for febrile neutropenia.  Today, 08/02/2022: Continues to spike fevers (102.60F this AM),   Plan: Restart Cefepime 2 g IV q12h for CrCl 30-60 ml/min Restart Vancomycin 750mg  IV q24h Goal AUC 400-550. Expected AUC: 492.8  SCr used: 0.8 (rounded up from 0.57)  Metronidazole and Micafungin per MD Follow final culture results Follow renal function  Height: 5' (152.4 cm) Weight: 52.1 kg (114 lb 12.8 oz) IBW/kg (Calculated) : 45.5  Temp (24hrs), Avg:100.9 F (38.3 C), Min:98.3 F (36.8 C), Max:103 F (39.4 C)  Recent Labs  Lab 07/29/22 0858 07/29/22 1438 07/30/22 0412 07/30/22 1010 07/31/22 0332 08/01/22 0322  WBC 1.5* 1.3*  --  1.1* 0.7* 1.1*  CREATININE  --  0.58 0.54 0.57 0.54 0.57     Estimated Creatinine Clearance: 49 mL/min (by C-G formula based on SCr of 0.57 mg/dL).    No Known Allergies  Antimicrobials this admission: Vancomycin 7/5 >> 7/7; restart 7/9 >> Cefepime 7/5 >> 7/8; restart 7/9 >> Ceftriaxone 7/9 x 1 dose Metronidazole 7/9 >> Micafungin 7/9 >>   Dose adjustments this admission:      Microbiology results:  7/5 BCx (at [facility]): ngtd 7/5 UCx (at [facility]): 10K E.coli  7/5: Resp panel: neg 7/5 Group A Strep: neg  Thank you for allowing pharmacy to be a part of this patient's care.  Maryellen Pile, PharmD 08/02/2022 3:24 PM

## 2022-08-02 NOTE — Progress Notes (Signed)
   08/02/22 1138  Assess: MEWS Score  Temp (!) 102.9 F (39.4 C)  BP (!) 132/43  MAP (mmHg) 67  Pulse Rate (!) 101  Resp 20  SpO2 98 %  O2 Device Room Air  Assess: MEWS Score  MEWS Temp 2  MEWS Systolic 0  MEWS Pulse 1  MEWS RR 0  MEWS LOC 0  MEWS Score 3  MEWS Score Color Yellow  Assess: if the MEWS score is Yellow or Red  Were vital signs taken at a resting state? Yes  Focused Assessment No change from prior assessment  Does the patient meet 2 or more of the SIRS criteria? Yes  Does the patient have a confirmed or suspected source of infection? No  MEWS guidelines implemented  No, previously yellow, continue vital signs every 4 hours  Notify: Charge Nurse/RN  Name of Charge Nurse/RN Notified Engineer, drilling  Provider Notification  Provider Name/Title Dr Sharl Ma  Date Provider Notified 08/02/22  Time Provider Notified 1226  Method of Notification  (secure chat)  Notification Reason Other (Comment) (temp 102.9)  Provider response No new orders  Date of Provider Response 08/02/22  Time of Provider Response 1228  Assess: SIRS CRITERIA  SIRS Temperature  1  SIRS Pulse 1  SIRS Respirations  0  SIRS WBC 0  SIRS Score Sum  2   MD notified. Will continue to monitor.

## 2022-08-02 NOTE — Inpatient Diabetes Management (Signed)
Inpatient Diabetes Program Recommendations  AACE/ADA: New Consensus Statement on Inpatient Glycemic Control (2015)  Target Ranges:  Prepandial:   less than 140 mg/dL      Peak postprandial:   less than 180 mg/dL (1-2 hours)      Critically ill patients:  140 - 180 mg/dL   Lab Results  Component Value Date   GLUCAP 197 (H) 08/02/2022   HGBA1C 11.9 (H) 07/10/2022    Review of Glycemic Control  Latest Reference Range & Units 07/31/22 17:17 07/31/22 21:37 08/01/22 08:00 08/01/22 11:56 08/01/22 16:08 08/01/22 21:24 08/02/22 08:37  Glucose-Capillary 70 - 99 mg/dL 83 914 (H) 782 (H) 956 (H) 316 (H) 239 (H) 197 (H)   Diabetes history: DM2 Outpatient Diabetes medications:  Humalog 10 units tid with meals Basaglar 20 units bid Janumet 50-1000 mg bid Current orders for Inpatient glycemic control:  Novolog 0-15 units tid with meals and HS Semglee 20 units bid Inpatient Diabetes Program Recommendations:    May consider adding Novolog 5 units tid with meals (meal coverage- hold if patient eats less than 50% or NPO).    Thanks,  Beryl Meager, RN, BC-ADM Inpatient Diabetes Coordinator Pager [phone]  (8a-5p)

## 2022-08-02 NOTE — Progress Notes (Signed)
   08/02/22 0413  Assess: MEWS Score  Temp (!) 102.8 F (39.3 C) (TSB; Ice packs, and Tylenol)  BP (!) 131/58  MAP (mmHg) 78  Pulse Rate (!) 102  ECG Heart Rate 99  Resp 19  SpO2 90 %  O2 Device Room Air  Assess: MEWS Score  MEWS Temp 2  MEWS Systolic 0  MEWS Pulse 0  MEWS RR 0  MEWS LOC 0  MEWS Score 2  MEWS Score Color Yellow  Assess: if the MEWS score is Yellow or Red  Were vital signs taken at a resting state? Yes  Focused Assessment No change from prior assessment  Does the patient meet 2 or more of the SIRS criteria? Yes  Does the patient have a confirmed or suspected source of infection? No  MEWS guidelines implemented  No, previously yellow, continue vital signs every 4 hours  Assess: SIRS CRITERIA  SIRS Temperature  1  SIRS Pulse 1  SIRS Respirations  0  SIRS WBC 0  SIRS Score Sum  2   TSB done. Ice packs placed in Pt's underarm and in between legs. Tylenol given as PRN meds. Current temp 101.1. Will continue to monitor.

## 2022-08-02 NOTE — Care Management Important Message (Signed)
Important Message  Patient Details IM Letter given. Name: Amanda Duarte MRN: [id number] Date of Birth: [birth year]   Medicare Important Message Given:  Yes     Caren Macadam 08/02/2022, 8:53 AM

## 2022-08-02 NOTE — Patient Outreach (Signed)
  Care Coordination   Follow Up Visit Note   08/02/2022 Name: Amanda Duarte MRN: [id number] DOB: [birth year]  Amanda Duarte is a 67 y.o. year old female who sees Claiborne Rigg, NP for primary care.    Patient scheduled for outreach today, noted she was readmitted to hospital on 7/5, remains inpatient today.    SDOH assessments and interventions completed:  No     Care Coordination Interventions:  Yes, provided   Interventions Today    Flowsheet Row Most Recent Value  General Interventions   General Interventions Discussed/Reviewed Communication with  Communication with RN  [Advised hospital liaison and [facility] nurse that patient has been readmitted to hospital, anticipate outreach once she is discharged home]       Follow up plan: Follow up call scheduled for pending hospital discharge    Encounter Outcome:  Pt. Visit Completed   Kemper Durie, RN, MSN, [facility] Care Management Care Management Coordinator [phone]

## 2022-08-02 NOTE — Consult Note (Signed)
   [facility] CM Inpatient Consult   08/02/2022  VERTIS VALLIANT November 01, 1955 [id number]  [facility] [[facility]]  Accountable Care Organization [ACO] Patient: Medicare ACO REACH  *[facility] RN Hospital Liaison remote coverage review for patient admitted to [facility]    Primary Care Provider:  Claiborne Rigg, NP with [facility] and Wellness   Patient is currently active with [facility] [[facility]] Care Management for chronic disease management services.  Patient has been engaged by a [employer].  Our community based plan of care has focused on disease management and community resource support.  RN CC is aware of admission and received communication today from RN CC.   Plan:  Continue to follow for progress and needs remotely while at [facility] note, [facility] Care Management services does not replace or interfere with any services that are needed or arranged by inpatient [facility] care management team.   For additional questions or referrals please contact:  Charlesetta Shanks, RN BSN CCM [facility]  [phone] business mobile phone Toll free office [phone]  *Concierge Line  [phone] Fax number: [phone] [email or web address]

## 2022-08-03 DIAGNOSIS — R5081 Fever presenting with conditions classified elsewhere: Secondary | ICD-10-CM | POA: Diagnosis not present

## 2022-08-03 DIAGNOSIS — D709 Neutropenia, unspecified: Secondary | ICD-10-CM | POA: Diagnosis not present

## 2022-08-03 LAB — BASIC METABOLIC PANEL
Anion gap: 14 (ref 5–15)
BUN: 17 mg/dL (ref 8–23)
CO2: 19 mmol/L — ABNORMAL LOW (ref 22–32)
Calcium: 8.7 mg/dL — ABNORMAL LOW (ref 8.9–10.3)
Chloride: 96 mmol/L — ABNORMAL LOW (ref 98–111)
Creatinine, Ser: 0.74 mg/dL (ref 0.44–1.00)
GFR, Estimated: >60 mL/min (ref >60)
Glucose, Bld: 212 mg/dL — ABNORMAL HIGH (ref 70–99)
Potassium: 3.4 mmol/L — ABNORMAL LOW (ref 3.5–5.1)
Sodium: 129 mmol/L — ABNORMAL LOW (ref 135–145)

## 2022-08-03 LAB — CBC
HCT: 24.4 % — ABNORMAL LOW (ref 36.0–46.0)
Hemoglobin: 8.2 g/dL — ABNORMAL LOW (ref 12.0–15.0)
MCH: 27.9 pg (ref 26.0–34.0)
MCHC: 33.6 g/dL (ref 30.0–36.0)
MCV: 83 fL (ref 80.0–100.0)
Platelets: 17 10*3/uL — CL (ref 150–400)
RBC: 2.94 MIL/uL — ABNORMAL LOW (ref 3.87–5.11)
RDW: 13.4 % (ref 11.5–15.5)
WBC: 0.7 10*3/uL — CL (ref 4.0–10.5)
nRBC: 4.3 % — ABNORMAL HIGH (ref 0.0–0.2)

## 2022-08-03 LAB — DIFFERENTIAL
Abs Immature Granulocytes: 0 10*3/uL (ref 0.00–0.07)
Basophils Absolute: 0 10*3/uL (ref 0.0–0.1)
Basophils Relative: 1 %
Eosinophils Absolute: 0 10*3/uL (ref 0.0–0.5)
Eosinophils Relative: 0 %
Immature Granulocytes: 0 %
Lymphocytes Relative: 59 %
Lymphs Abs: 0.4 10*3/uL — ABNORMAL LOW (ref 0.7–4.0)
Monocytes Absolute: 0.1 10*3/uL (ref 0.1–1.0)
Monocytes Relative: 11 %
Neutro Abs: 0.2 10*3/uL — CL (ref 1.7–7.7)
Neutrophils Relative %: 29 %

## 2022-08-03 LAB — CULTURE, BLOOD (ROUTINE X 2)

## 2022-08-03 LAB — GLUCOSE, CAPILLARY
Glucose-Capillary: 222 mg/dL — ABNORMAL HIGH (ref 70–99)
Glucose-Capillary: 197 mg/dL — ABNORMAL HIGH (ref 70–99)
Glucose-Capillary: 253 mg/dL — ABNORMAL HIGH (ref 70–99)
Glucose-Capillary: 176 mg/dL — ABNORMAL HIGH (ref 70–99)

## 2022-08-03 MED ORDER — INSULIN ASPART 100 UNIT/ML IJ SOLN
5.0000 [IU] | Freq: Three times a day (TID) | INTRAMUSCULAR | Status: DC
  Administered 2022-08-03 – 2022-08-11 (×5): 5 [IU] via SUBCUTANEOUS

## 2022-08-03 NOTE — Progress Notes (Signed)
Pt has temp 103.2, HR 113. Writer removed blankets, decreased room temp, administered PRN tylenol and applied ice packs. Will be rechecking temp hourly. Provider notified.

## 2022-08-03 NOTE — TOC Initial Note (Signed)
Transition of Care [facility]) - Initial/Assessment Note    Patient Details  Name: Amanda Duarte MRN: [id number] Date of Birth: 05/08/1955  Transition of Care [facility]) CM/SW Contact:    Catalina Gravel, LCSW Phone Number: 08/03/2022, 1:29 PM  Clinical Narrative:                 CSW assessed pt  due to  readmission risk score. Daughter was present and assisted with translation. Pt lives in a home and her grandson lives at property as well.  Pt stays   Mother wants to be independent so daughter stated that she offers her to stay at her home, but he prefers to be home. Family is able to assist with ADLS if Pt accepts.  Family drives to Dr. Algie Coffer and mother will take Uber/Insurance home from medical appointments. For DME pt has a shower chair in shower and walker.  Pt is open to  [facility] PT if recommended. TOC to follow.  Daughter asked for utility assistance, CSW provided DSS as a resource and will add info to AVS.  Expected Discharge Plan: Home/Self Care Barriers to Discharge: Continued Medical Work up   Patient Goals and CMS Choice Patient states their goals for this hospitalization and ongoing recovery are:: Return Home   Choice offered to / list presented to : Patient      Expected Discharge Plan and Services In-house Referral: Clinical Social Work     Living arrangements for the past 2 months: Single Family Home (Grandson lives there)                                      Prior Living Arrangements/Services Living arrangements for the past 2 months: Single Family Home (Grandson lives there) Lives with:: Relatives Patient language and need for interpreter reviewed:: Yes Do you feel safe going back to the place where you live?: Yes      Need for Family Participation in Patient Care: No (Comment) Care giver support system in place?: Yes (comment) (Grandson lives in home and daughter will have her stay in her home.)   Criminal Activity/Legal Involvement Pertinent to Current  Situation/Hospitalization: No - Comment as needed  Activities of Daily Living Home Assistive Devices/Equipment: Environmental consultant (specify type) ADL Screening (condition at time of admission) Patient's cognitive ability adequate to safely complete daily activities?: Yes Is the patient deaf or have difficulty hearing?: No Does the patient have difficulty seeing, even when wearing glasses/contacts?: No Does the patient have difficulty concentrating, remembering, or making decisions?: No Patient able to express need for assistance with ADLs?: Yes Does the patient have difficulty dressing or bathing?: No Independently performs ADLs?: Yes (appropriate for developmental age) Does the patient have difficulty walking or climbing stairs?: No Weakness of Legs: None Weakness of Arms/Hands: None  Permission Sought/Granted                  Emotional Assessment Appearance:: Appears older than stated age Attitude/Demeanor/Rapport: Lethargic Affect (typically observed): Accepting Orientation: : Oriented to Self, Oriented to Place, Oriented to Situation Alcohol / Substance Use: Not Applicable    Admission diagnosis:  Febrile illness [R50.9] Neutropenic fever (HCC) [D70.9, R50.81] Patient Active Problem List   Diagnosis Date Noted   Cellulitis of right orbital region 07/13/2022   Neutropenia (HCC) 07/12/2022   Periorbital cellulitis 07/09/2022   Other pancytopenia (HCC) 06/23/2022   Neutropenic fever (HCC) 12/14/2021   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   Lobar pneumonia, unspecified organism (HCC) 12/07/2021   Malnutrition of moderate degree 12/07/2021   Aspiration pneumonia of left lower lobe due to gastric secretions (HCC) 12/07/2021   Acute respiratory failure with hypoxia (HCC) 12/06/2021   On mechanically assisted ventilation (HCC) 12/06/2021   Acute respiratory distress syndrome (ARDS) (HCC) 12/06/2021   Septic shock (HCC) 12/06/2021   Sepsis with acute organ dysfunction  without septic shock (HCC) 12/02/2021   GERD (gastroesophageal reflux disease) 12/02/2021   MDS (myelodysplastic syndrome), high grade (HCC) 11/19/2021   Pancytopenia (HCC) 10/21/2021   Thrombocytopenia (HCC) 10/21/2021   Hyponatremia 10/21/2021   Hypokalemia 10/21/2021   Anxiety 10/21/2021   Depression 10/21/2021   Axillary mass 04/16/2021   Abdominal pain 11/20/2019   Vaginal atrophy 11/20/2019   Acute vulvitis 11/20/2019   Current moderate episode of major depressive disorder without prior episode (HCC) 02/08/2019   HLD (hyperlipidemia) 06/27/2018   Port-A-Cath in place 06/09/2017   Malignant neoplasm of upper-inner quadrant of left breast in female, 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   Closed fracture of left tibial plateau    Axillary lymphadenopathy 04/12/2017   Closed fracture of lateral portion of left tibial plateau 04/11/2017   Chest pain, neg MI, neg stress Nuc study, possible GI 05/19/2014   Essential hypertension 05/19/2014   Type 2 diabetes mellitus with hyperlipidemia (HCC) 05/19/2014   PCP:  Claiborne Rigg, NP Pharmacy:   [facility] [address], [place] - [address] [place] [address] Phone: [phone] Fax: [phone]     Social Determinants of Health (SDOH) Social History: SDOH Screenings   Food Insecurity: Food Insecurity Present (07/29/2022)  Housing: Medium Risk (07/29/2022)  Transportation Needs: No Transportation Needs (07/29/2022)  Utilities: At Risk (07/29/2022)  Alcohol Screen: Low Risk  (03/17/2022)  Depression (PHQ2-9): Low Risk  (05/16/2022)  Financial Resource Strain: Medium Risk (03/17/2022)  Physical Activity: Insufficiently Active (03/17/2022)  Social Connections: Moderately Isolated (03/17/2022)  Stress: No Stress Concern Present (03/17/2022)  Tobacco Use: Low Risk  (08/02/2022)   SDOH Interventions:     Readmission Risk Interventions    08/03/2022    1:20 PM 07/11/2022     2:50 PM 12/02/2021    2:10 PM  Readmission Risk Prevention Plan  Transportation Screening Complete Complete Complete  PCP or Specialist Appt within 5-7 Days   Complete  PCP or Specialist Appt within 3-5 Days Complete Complete   Home Care Screening   Complete  Medication Review (RN CM)   Complete  HRI or Home Care Consult Complete Complete   Social Work Consult for Recovery Care Planning/Counseling Complete Complete   Palliative Care Screening Not Applicable Not Applicable   Medication Review Oceanographer) Complete Complete

## 2022-08-03 NOTE — Progress Notes (Signed)
   08/02/22 2129  Vitals  Temp (!) 101.8 F (38.8 C)  Temp Source Oral  BP (!) 160/51  MAP (mmHg) 79  BP Location Right Leg  BP Method Automatic  Patient Position (if appropriate) Lying  Pulse Rate (!) 113  Pulse Rate Source Monitor  ECG Heart Rate (!) 115  Resp (!) 29  MEWS COLOR  MEWS Score Color Red  Oxygen Therapy  SpO2 100 %  O2 Device Room Air  MEWS Score  MEWS Temp 2  MEWS Systolic 0  MEWS Pulse 2  MEWS RR 2  MEWS LOC 0  MEWS Score 6    I removed blankets, decreased room temp, administered PRN tylenol and applied ice packs. Provider notified. Will be re-checking temp Q1H X2.

## 2022-08-03 NOTE — Progress Notes (Signed)
Subjective: No new complaints   Antibiotics:  Anti-infectives (From admission, onward)    Start     Dose/Rate Route Frequency Ordered Stop   08/02/22 1700  metroNIDAZOLE (FLAGYL) tablet 500 mg        500 mg Oral Every 12 hours 08/02/22 1446     08/02/22 1600  ceFEPIme (MAXIPIME) 2 g in sodium chloride 0.9 % 100 mL IVPB        2 g 200 mL/hr over 30 Minutes Intravenous Every 12 hours 08/02/22 1506     08/02/22 1600  vancomycin (VANCOREADY) IVPB 750 mg/150 mL        750 mg 150 mL/hr over 60 Minutes Intravenous Every 24 hours 08/02/22 1520     08/02/22 1000  cefTRIAXone (ROCEPHIN) 2 g in sodium chloride 0.9 % 100 mL IVPB  Status:  Discontinued        2 g 200 mL/hr over 30 Minutes Intravenous Every 24 hours 08/01/22 1841 08/02/22 1506   08/01/22 2000  micafungin (MYCAMINE) 100 mg in sodium chloride 0.9 % 100 mL IVPB        100 mg 105 mL/hr over 1 Hours Intravenous Every 24 hours 08/01/22 1835     07/30/22 1800  vancomycin (VANCOREADY) IVPB 750 mg/150 mL  Status:  Discontinued        750 mg 150 mL/hr over 60 Minutes Intravenous Every 24 hours 07/29/22 1825 07/31/22 0924   07/30/22 0500  ceFEPIme (MAXIPIME) 2 g in sodium chloride 0.9 % 100 mL IVPB        2 g 200 mL/hr over 30 Minutes Intravenous Every 12 hours 07/29/22 1825 08/01/22 2130   07/29/22 1630  ceFEPIme (MAXIPIME) 2 g in sodium chloride 0.9 % 100 mL IVPB        2 g 200 mL/hr over 30 Minutes Intravenous  Once 07/29/22 1617 07/29/22 1818   07/29/22 1630  vancomycin (VANCOCIN) IVPB 1000 mg/200 mL premix        1,000 mg 200 mL/hr over 60 Minutes Intravenous  Once 07/29/22 1628 07/30/22 0749       Medications: Scheduled Meds:  citalopram  20 mg Oral Daily   insulin aspart  0-15 Units Subcutaneous TID WC   insulin aspart  0-5 Units Subcutaneous QHS   insulin aspart  5 Units Subcutaneous TID WC   insulin glargine-yfgn  20 Units Subcutaneous BID   losartan  50 mg Oral Daily   metroNIDAZOLE  500 mg Oral Q12H    pantoprazole  40 mg Oral QAC breakfast   Continuous Infusions:  ceFEPime (MAXIPIME) IV 2 g (08/03/22 0549)   micafungin (MYCAMINE) 100 mg in sodium chloride 0.9 % 100 mL IVPB 100 mg (08/02/22 2042)   vancomycin 750 mg (08/02/22 2227)   PRN Meds:.acetaminophen **OR** acetaminophen, acetaminophen, albuterol, ondansetron **OR** ondansetron (ZOFRAN) IV, oxyCODONE, traZODone    Objective: Weight change:   Intake/Output Summary (Last 24 hours) at 08/03/2022 1308 Last data filed at 08/03/2022 0800 Gross per 24 hour  Intake 595 ml  Output --  Net 595 ml   Blood pressure (!) 121/44, pulse 93, temperature (!) 100.4 F (38 C), temperature source Oral, resp. rate 18, height 5' (1.524 m), weight 52.1 kg, SpO2 96 %. Temp:  [98.8 F (37.1 C)-103.2 F (39.6 C)] 100.4 F (38 C) (07/10 1153) Pulse Rate:  [68-113] 93 (07/10 1153) Resp:  [16-29] 18 (07/10 1153) BP: (101-160)/(41-64) 121/44 (07/10 1153) SpO2:  [94 %-100 %] 96 % (07/10 1153)  Physical Exam: Physical Exam Constitutional:      General: She is not in acute distress.    Appearance: She is well-developed. She is not diaphoretic.  HENT:     Head: Normocephalic and atraumatic.     Right Ear: External ear normal.     Left Ear: External ear normal.     Mouth/Throat:     Pharynx: No oropharyngeal exudate.  Eyes:     General: No scleral icterus.    Conjunctiva/sclera: Conjunctivae normal.     Pupils: Pupils are equal, round, and reactive to light.  Cardiovascular:     Rate and Rhythm: Normal rate and regular rhythm.     Heart sounds: Normal heart sounds. No murmur heard.    No friction rub. No gallop.  Pulmonary:     Effort: Pulmonary effort is normal. No respiratory distress.     Breath sounds: Wheezing present. No rales.  Abdominal:     General: Bowel sounds are normal. There is no distension.     Palpations: Abdomen is soft.     Tenderness: There is no abdominal tenderness. There is no rebound.  Musculoskeletal:         General: No tenderness. Normal range of motion.  Lymphadenopathy:     Cervical: No cervical adenopathy.  Skin:    General: Skin is warm and dry.     Coloration: Skin is not pale.     Findings: No erythema or rash.  Neurological:     General: No focal deficit present.     Mental Status: She is alert and oriented to person, place, and time.     Motor: No abnormal muscle tone.     Coordination: Coordination normal.  Psychiatric:        Mood and Affect: Mood normal.        Behavior: Behavior normal.        Thought Content: Thought content normal.        Judgment: Judgment normal.      CBC:    BMET Recent Labs    08/01/22 0322 08/03/22 0711  NA 131* 129*  K 3.9 3.4*  CL 99 96*  CO2 22 19*  GLUCOSE 241* 212*  BUN 15 17  CREATININE 0.57 0.74  CALCIUM 8.6* 8.7*     Liver Panel  Recent Labs    08/01/22 0322  PROT 8.1  ALBUMIN 3.0*  AST 20  ALT 13  ALKPHOS 72  BILITOT 0.4       Sedimentation Rate No results for input(s): "ESRSEDRATE" in the last 72 hours. C-Reactive Protein No results for input(s): "CRP" in the last 72 hours.  Micro Results: Recent Results (from the past 720 hour(s))  Culture, blood (routine x 2)     Status: None   Collection Time: 07/09/22  3:38 PM   Specimen: BLOOD  Result Value Ref Range Status   Specimen Description   Final    BLOOD SITE NOT SPECIFIED Performed at [facility], [address], [place], [place] [address]    Special Requests   Final    BOTTLES DRAWN AEROBIC AND ANAEROBIC Blood Culture adequate volume Performed at [facility], [address], [place], [place] [address]    Culture   Final    NO GROWTH 5 DAYS Performed at [facility], [address] [place], [place] [address]    Report Status 07/14/2022 FINAL  Final  Culture, blood (routine x 2)     Status: None  Collection Time: 07/09/22  3:43 PM   Specimen: BLOOD  Result Value Ref Range Status   Specimen  Description   Final    BLOOD LEFT ANTECUBITAL Performed at [facility], [address], [place], [place] [address]    Special Requests   Final    BOTTLES DRAWN AEROBIC AND ANAEROBIC Blood Culture adequate volume Performed at [facility], [address], [place], [place] [address]    Culture   Final    NO GROWTH 5 DAYS Performed at [facility], [address], [place], [place] [address]    Report Status 07/14/2022 FINAL  Final  Culture, blood (Routine X 2) w Reflex to ID Panel     Status: None   Collection Time: 07/29/22 10:12 AM   Specimen: BLOOD  Result Value Ref Range Status   Specimen Description   Final    BLOOD Performed at [facility], [address], [place], [place] [address]    Special Requests   Final    BLOOD Performed at [facility], [address], [place], [place] [address]    Culture   Final    NO GROWTH 5 DAYS Performed at [facility], [address], [place], [place] [address]    Report Status 08/03/2022 FINAL  Final  Culture, blood (Routine X 2) w Reflex to ID Panel     Status: None   Collection Time: 07/29/22 10:14 AM   Specimen: BLOOD  Result Value Ref Range Status   Specimen Description   Final    BLOOD Performed at [facility], [address], [place], [place] [address]    Special Requests   Final    BLOOD Performed at [facility], [address], [place], [place] [address]    Culture   Final    NO GROWTH 5 DAYS Performed at [facility], [address], [place], [place] [address]    Report Status 08/03/2022 FINAL  Final  Urine Culture     Status: Abnormal   Collection Time: 07/29/22 10:38 AM   Specimen: Urine, Random  Result Value Ref Range Status   Specimen Description   Final    URINE, RANDOM Performed at [facility], [address], [place],  [place] [address]    Special Requests   Final    NONE Performed at [facility], [address], [place], [place] [address]    Culture 10,000 COLONIES/mL ESCHERICHIA COLI (A)  Final   Report Status 07/31/2022 FINAL  Final   Organism ID, Bacteria ESCHERICHIA COLI (A)  Final      Susceptibility   Escherichia coli - MIC*    AMPICILLIN <=2 SENSITIVE Sensitive     CEFAZOLIN <=4 SENSITIVE Sensitive     CEFEPIME <=0.12 SENSITIVE Sensitive     CEFTRIAXONE <=0.25 SENSITIVE Sensitive     CIPROFLOXACIN <=0.25 SENSITIVE Sensitive     GENTAMICIN <=1 SENSITIVE Sensitive     IMIPENEM <=0.25 SENSITIVE Sensitive     NITROFURANTOIN <=16 SENSITIVE Sensitive     TRIMETH/SULFA <=20 SENSITIVE Sensitive     AMPICILLIN/SULBACTAM <=2 SENSITIVE Sensitive     PIP/TAZO <=4 SENSITIVE Sensitive     * 10,000 COLONIES/mL ESCHERICHIA COLI  Resp panel by RT-PCR (RSV, Flu A&B, Covid) Anterior Nasal Swab     Status: None   Collection Time: 07/29/22  2:39 PM   Specimen:  Anterior Nasal Swab  Result Value Ref Range Status   SARS Coronavirus 2 by RT PCR NEGATIVE NEGATIVE Final    Comment: (NOTE) SARS-CoV-2 target nucleic acids are NOT DETECTED.  The SARS-CoV-2 RNA is generally detectable in upper respiratory specimens during the acute phase of infection. The lowest concentration of SARS-CoV-2 viral copies this assay can detect is 138 copies/mL. A negative result does not preclude SARS-Cov-2 infection and should not be used as the sole basis for treatment or other patient management decisions. A negative result may occur with  improper specimen collection/handling, submission of specimen other than nasopharyngeal swab, presence of viral mutation(s) within the areas targeted by this assay, and inadequate number of viral copies(<138 copies/mL). A negative result must be combined with clinical observations, patient history, and epidemiological information. The expected result is Negative.  Fact Sheet  for Patients:  BloggerCourse.com  Fact Sheet for Healthcare Providers:  SeriousBroker.it  This test is no t yet approved or cleared by the [place] FDA and  has been authorized for detection and/or diagnosis of SARS-CoV-2 by FDA under an Emergency Use Authorization (EUA). This EUA will remain  in effect (meaning this test can be used) for the duration of the COVID-19 declaration under Section 564(b)(1) of the Act, 21 U.S.C.section 360bbb-3(b)(1), unless the authorization is terminated  or revoked sooner.       Influenza A by PCR NEGATIVE NEGATIVE Final   Influenza B by PCR NEGATIVE NEGATIVE Final    Comment: (NOTE) The Xpert Xpress SARS-CoV-2/FLU/RSV plus assay is intended as an aid in the diagnosis of influenza from Nasopharyngeal swab specimens and should not be used as a sole basis for treatment. Nasal washings and aspirates are unacceptable for Xpert Xpress SARS-CoV-2/FLU/RSV testing.  Fact Sheet for Patients: BloggerCourse.com  Fact Sheet for Healthcare Providers: SeriousBroker.it  This test is not yet approved or cleared by the [place] FDA and has been authorized for detection and/or diagnosis of SARS-CoV-2 by FDA under an Emergency Use Authorization (EUA). This EUA will remain in effect (meaning this test can be used) for the duration of the COVID-19 declaration under Section 564(b)(1) of the Act, 21 U.S.C. section 360bbb-3(b)(1), unless the authorization is terminated or revoked.     Resp Syncytial Virus by PCR NEGATIVE NEGATIVE Final    Comment: (NOTE) Fact Sheet for Patients: BloggerCourse.com  Fact Sheet for Healthcare Providers: SeriousBroker.it  This test is not yet approved or cleared by the [place] FDA and has been authorized for detection and/or diagnosis of SARS-CoV-2 by FDA under an  Emergency Use Authorization (EUA). This EUA will remain in effect (meaning this test can be used) for the duration of the COVID-19 declaration under Section 564(b)(1) of the Act, 21 U.S.C. section 360bbb-3(b)(1), unless the authorization is terminated or revoked.  Performed at [facility], [address], [place], [place] [address]   Group A Strep by PCR     Status: None   Collection Time: 07/29/22  2:40 PM   Specimen: Throat; Sterile Swab  Result Value Ref Range Status   Group A Strep by PCR NOT DETECTED NOT DETECTED Final    Comment: Performed at [facility], [address] [place], [place] [address]    Studies/Results: CT CHEST ABDOMEN PELVIS W CONTRAST  Result Date: 08/02/2022 CLINICAL DATA:  Sepsis. EXAM: CT CHEST, ABDOMEN, AND PELVIS WITH CONTRAST TECHNIQUE: Multidetector CT imaging of the chest, abdomen and pelvis was performed following the standard protocol during bolus administration of intravenous  contrast. RADIATION DOSE REDUCTION: This exam was performed according to the departmental dose-optimization program which includes automated exposure control, adjustment of the mA and/or kV according to patient size and/or use of iterative reconstruction technique. CONTRAST:  OMNIPAQUE IOHEXOL 300 MG/ML  SOLN COMPARISON:  Chest abdomen pelvis CT 12/05/2021, chest radiograph 07/29/2022 FINDINGS: CT CHEST FINDINGS Cardiovascular: Normal heart size. No pericardial effusion. There are coronary artery calcifications aortic atherosclerosis without acute aortic findings. Mediastinum/Nodes: Shotty mediastinal nodes, including the 12 mm subcarinal lymph node. Patulous distal esophagus. No thyroid nodule. Lungs/Pleura: Multifocal airspace disease. There are tree-in-bud and nodular opacities throughout all lobes of both lungs. More confluent areas of consolidation involving the left lower lobe where there is also septal thickening. No significant pleural  effusion. No tracheal or endobronchial debris. Musculoskeletal: There are no acute or suspicious osseous abnormalities. CT ABDOMEN PELVIS FINDINGS Hepatobiliary: The left lobe of the liver is enlarged. There is no focal hepatic abnormality. Gallbladder physiologically distended, no calcified stone. No biliary dilatation. Pancreas: No ductal dilatation or inflammation. Spleen: Enlarged, 14 cm AP dimension.  No focal splenic abnormality. Adrenals/Urinary Tract: Normal adrenal glands. No hydronephrosis or perinephric edema. Homogeneous renal enhancement with symmetric excretion on delayed phase imaging. There is excreted IV contrast in both renal collecting systems in the urinary bladder. Urinary bladder is nondistended. Stomach/Bowel: No bowel obstruction or inflammation. Moderate volume of colonic stool. Normal appendix visualized. The stomach is nondistended. Vascular/Lymphatic: Aortic atherosclerosis. No aneurysm. Patent portal vein. No bulky abdominopelvic adenopathy. Reproductive: Uterus and bilateral adnexa are unremarkable. Other: No ascites. No abdominopelvic collection. There multiple subcutaneous densities in the anterior abdominal wall, typical of medication injection sites. Musculoskeletal: There are no acute or suspicious osseous abnormalities. IMPRESSION: 1. Multifocal airspace disease with tree-in-bud and nodular opacities throughout all lobes of both lungs. More confluent areas of consolidation involving the left lower lobe where there is also septal thickening. Findings are most consistent with multifocal pneumonia. 2. No acute abnormality in the abdomen/pelvis. 3. Mild splenomegaly. Aortic Atherosclerosis (ICD10-I70.0). Electronically Signed   By: Narda Rutherford M.D.   On: 08/02/2022 22:02   CT ORBITS W CONTRAST  Result Date: 08/02/2022 CLINICAL DATA:  67 year old female with breast cancer, fever and neutropenia. Right orbital cellulitis. EXAM: CT ORBITS WITH CONTRAST TECHNIQUE: Multidetector CT  images was performed according to the standard protocol following intravenous contrast administration. RADIATION DOSE REDUCTION: This exam was performed according to the departmental dose-optimization program which includes automated exposure control, adjustment of the mA and/or kV according to patient size and/or use of iterative reconstruction technique. CONTRAST:  75mL OMNIPAQUE IOHEXOL 300 MG/ML  SOLN COMPARISON:  Orbit CT 07/12/2022. FINDINGS: Orbits: Right orbits soft tissue swelling and stranding appears resolved since last month. Globes appear symmetric and intact. Periorbital soft tissues now appears symmetric. Postseptal orbits soft tissues now appears symmetric and within normal limits. Visible paranasal sinuses: Paranasal sinus mucosal thickening and occasional sinus opacification is now more symmetric and bilateral. But no discrete sinus fluid levels or bubbly opacity are identified. In the tympanic cavities and mastoids remain well aerated. Soft tissues: Negative visible pharynx, parapharyngeal spaces, retropharyngeal space, masticator and parotid spaces. Retro maxillary soft tissues also appear symmetric and normal. Osseous: No acute or suspicious osseous lesion identified. Limited intracranial: Negative. In the major vascular structures in the visible face and at the skull base are enhancing and appear to be patent. Calcified atherosclerosis at the skull base. IMPRESSION: 1. Essentially resolved right orbital cellulitis since last month. Negative CT appearance of the  orbits now. 2. Unresolved paranasal sinus inflammation, now more bilateral and symmetric, but no complicating features are identified. Electronically Signed   By: Odessa Fleming M.D.   On: 08/02/2022 08:32      Assessment/Plan:  INTERVAL HISTORY:    Chest and pelvis shows multifocal airspace disease with tree-in-bud nodular opacity throughout all lobes of the lungs with some more confluent areas of consolidation of the left lower lobe  and some splenomegaly seen  Principal Problem:   Neutropenic fever (HCC)    KIERNAN FARKAS is a 67 y.o. female with breast cancer then myelodysplastic syndrome, complicated by profound pancytopenia after being given azacitidine injection on November 15, 2021 , with hospitalization with neutropenic fevers and prolonged stay involving intubation and mechanical ventilation, QT prolongation on voriconazole, then admission in June 2024 with what was thought to be orbital cellulitis with transfer to [facility].  I still do not have the records from [facility] but from talking to the patient and her daughter it appears as if the patient had a tear duct that needed to be opened up for her periorbital cellulitis to resolve.  She was supposedly in the hospital for only 3 days  Our staff and they are endeavoring to get the records from [facility].  Hopefully IT can also patch the care everywhere gap She has been admitted and is on broad-spectrum antibiotics currently with cefepime vancomycin Flagyl and micafungin.  Chest abdomen pelvis shows multiple lung nodules laterally with some consolidation and some areas  #1 Neutropenic fevers:  Based on imaging's lungs seem to be the source of her infection  I have reviewed the case with Dr. Vassie Loll with pulmonary critical care including images.  Patient is profoundly thrombocytopenic.  He thinks based on the patient's current status that we should continue with empiric therapy rather than endeavoring to go down the route of a bronchoscopy with BAL.  Certainly if she worsens I think we may be forced to go down that route.  Her problems with QT prolongation does complicate optimal antifungal coverage for her but I think right now I am okay with continuing the echinocandin  therapy  Pneumococcal antigen is negative, up to cryptococcal antigen negative  Legionella antigen Fungitell Aspergillus antigen,histoplsma ag still pending  I have personally  spent 50 minutes involved in face-to-face and non-face-to-face activities for this patient on the day of the visit. Professional time spent includes the following activities: Preparing to see the patient (review of tests), Obtaining and/or reviewing separately obtained history (admission/discharge record), Performing a medically appropriate examination and/or evaluation , Ordering medications/tests/procedures, referring and communicating with other health care professionals, Documenting clinical information in the EMR, Independently interpreting results (not separately reported), Communicating results to the patient/family/caregiver, Counseling and educating the patient/family/caregiver and Care coordination (not separately reported).     LOS: 5 days   Acey Lav 08/03/2022, 1:08 PM

## 2022-08-03 NOTE — Progress Notes (Signed)
PROGRESS NOTE    Amanda Duarte  UJW:[id number] DOB: [birth year] DOA: 07/29/2022 PCP: Claiborne Rigg, NP  Chief Complaint  Patient presents with   Fever    Brief Narrative:   67 y.o. female with medical history significant for treated breast cancer, insulin-dependent type 2 diabetes, myelodysplastic syndrome being admitted to the hospital with neutropenic fever.  She presented to oncology clinic with Dr. Mosetta Putt for routine follow-up, complaint of sore fever but no other symptoms, is currently on azacitadine injection. Today in the oncology clinic she was found to have fever 100.4. Lab work was obtained, showed platelets 14,000 was given 1 unit platelet transfusion in the oncology office and then sent to [facility] ED for evaluation  Chest x-ray with mild peribronchial thickening, UA without evidence of infection. Blood and urine cultures were obtained, she was started empirically on IV vancomycin and IV cefepime.   Assessment & Plan:   Principal Problem:   Neutropenic fever (HCC)  Neutropenic fever  Multifocal Pneumonia  E. Coli UTI -Recently treated for facial cellulitis - CT orbits was again obtained which shows resolution of periorbital cellulitis -CT chest abdomen pelvis with multifocal airspace disease with tree in bud and nodular opacities throughout all lobes of both lungs - multifocal pneumonia -Urine culture grew E. coli, pansensitive -ANC 0.2 -appreciate ID assistance - cefepime, vanc, micafungin - given thrombocytopenia, no plan for bronch at this point per ID discussion with pulmonology - negative strep pneumonia ag, urine legionella pending, histoplasma antigen pending, negative crypto ag, fungitell pending - blood cultures NGTD - appreciate oncology assistance  Mild dysplastic syndrome, high-grade Pancytopenia  Severe Thrombocytopenia  Anemia  Neutropenia as Above -Diagnosed in 10/23 -Pulmonary biopsy showed high-grade myeloid neoplasm -Started on azacitidine injection -She  was seen at [facility] for bone marrow transplant -Repeat bone marrow biopsy in April 2024 showed persistent high-grade MDS, 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 Sharyne Richters pending per oncology -Oncology following - transfse for Hb <7.5, platelets < 15,000   UTI -Urine culture grew E. 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, triple negative -Diagnosed in 4/19 -S/p left mastectomy, neoadjuvant chemotherapy and radiation treatment -Had excellent response -Recent mammogram on 7/23 and right axilla ultrasound on 10/23 were benign -She is on surveillance now, followed by oncology as outpatient    DVT prophylaxis: SCD Code Status: full Family Communication: none Disposition:   Status is: Inpatient Remains inpatient appropriate because: need for continued inpatient care   Consultants:  ID oncology  Procedures:  none  Antimicrobials:  Anti-infectives (From admission, onward)    Start     Dose/Rate Route Frequency Ordered Stop   08/02/22 1700  metroNIDAZOLE (FLAGYL) tablet 500 mg        500 mg Oral Every 12 hours 08/02/22 1446     08/02/22 1600  ceFEPIme (MAXIPIME) 2 g in sodium chloride 0.9 % 100 mL IVPB        2 g 200 mL/hr over 30 Minutes Intravenous Every 12 hours 08/02/22 1506     08/02/22 1600  vancomycin (VANCOREADY) IVPB 750 mg/150 mL        750 mg 150 mL/hr over 60 Minutes Intravenous Every 24 hours 08/02/22 1520     08/02/22 1000  cefTRIAXone (  ROCEPHIN) 2 g in sodium chloride 0.9 % 100 mL IVPB  Status:  Discontinued        2 g 200 mL/hr over 30 Minutes Intravenous Every 24 hours 08/01/22 1841 08/02/22 1506   08/01/22 2000  micafungin (MYCAMINE) 100 mg in sodium chloride  0.9 % 100 mL IVPB        100 mg 105 mL/hr over 1 Hours Intravenous Every 24 hours 08/01/22 1835     07/30/22 1800  vancomycin (VANCOREADY) IVPB 750 mg/150 mL  Status:  Discontinued        750 mg 150 mL/hr over 60 Minutes Intravenous Every 24 hours 07/29/22 1825 07/31/22 0924   07/30/22 0500  ceFEPIme (MAXIPIME) 2 g in sodium chloride 0.9 % 100 mL IVPB        2 g 200 mL/hr over 30 Minutes Intravenous Every 12 hours 07/29/22 1825 08/01/22 2130   07/29/22 1630  ceFEPIme (MAXIPIME) 2 g in sodium chloride 0.9 % 100 mL IVPB        2 g 200 mL/hr over 30 Minutes Intravenous  Once 07/29/22 1617 07/29/22 1818   07/29/22 1630  vancomycin (VANCOCIN) IVPB 1000 mg/200 mL premix        1,000 mg 200 mL/hr over 60 Minutes Intravenous  Once 07/29/22 1628 07/30/22 0749       Subjective: Spanish interpreter used She denies complaints today  Objective: Vitals:   08/03/22 0547 08/03/22 0752 08/03/22 1153 08/03/22 1513  BP: (!) 143/64 (!) 105/46 (!) 121/44 (!) 103/44  Pulse: (!) 113 95 93 77  Resp:  (!) 24 18 (!) 22  Temp: (!) 103.2 F (39.6 C) 100.2 F (37.9 C) (!) 100.4 F (38 C) 99.3 F (37.4 C)  TempSrc: Oral Oral Oral Oral  SpO2: 98% 100% 96% 96%  Weight:      Height:        Intake/Output Summary (Last 24 hours) at 08/03/2022 1835 Last data filed at 08/03/2022 0800 Gross per 24 hour  Intake 355 ml  Output --  Net 355 ml   Filed Weights   07/29/22 1747 07/29/22 1748  Weight: 55 kg 52.1 kg    Examination:  General exam: Appears calm and comfortable  Respiratory system: unlabored Cardiovascular system: RRR Gastrointestinal system: Abdomen is nondistended, soft and nontender.  Central nervous system: Alert and oriented. No focal neurological deficits. Extremities: no LEE  Data Reviewed: I have personally reviewed following labs and imaging studies  CBC: Recent Labs  Lab 07/29/22 0858 07/29/22 0858 07/29/22 1438 07/30/22 1010 07/31/22 0332 08/01/22 0322 08/02/22 1618  08/03/22 0711  WBC 1.5*   < > 1.3* 1.1* 0.7* 1.1* 1.0* 0.7*  NEUTROABS 0.1*  --  0.1* 0.1*  --   --  0.2* 0.2*  HGB 9.8*   < > 8.4* 8.9* 9.0* 8.5* 7.2* 8.2*  HCT 28.8*  --  24.9* 26.6* 27.1* 25.3* 21.4* 24.4*  MCV 83.5  --  84.4 86.1 85.0 84.3 84.6 83.0  PLT 14*   < > 22* 19* 13* 8* 17* 17*   < > = values in this interval not displayed.    Basic Metabolic Panel: Recent Labs  Lab 07/29/22 1438 07/30/22 0412 07/30/22 1010 07/31/22 0332 08/01/22 0322 08/03/22 0711  NA 134*  --  132* 130* 131* 129*  K 3.2*  --  3.6 3.8 3.9 3.4*  CL 103  --  103 102 99 96*  CO2 24  --  21* 20* 22 19*  GLUCOSE 205*  --  187* 123* 241* 212*  BUN 17  --  18 13 15 17   CREATININE 0.58 0.54 0.57 0.54 0.57 0.74  CALCIUM 8.2*  --  8.2* 8.3* 8.6* 8.7*  MG 1.5*  --   --   --   --   --     GFR: Estimated Creatinine Clearance: 49 mL/min (by C-G formula based on SCr of 0.74 mg/dL).  Liver Function Tests: Recent Labs  Lab 07/29/22 1438 07/31/22 0332 08/01/22 0322  AST 13* 17 20  ALT 12 13 13   ALKPHOS 61 65 72  BILITOT 0.5 0.6 0.4  PROT 7.8 8.2* 8.1  ALBUMIN 3.0* 3.2* 3.0*    CBG: Recent Labs  Lab 08/02/22 1653 08/02/22 2120 08/03/22 0733 08/03/22 1149 08/03/22 1643  GLUCAP 272* 146* 222* 197* 253*     Recent Results (from the past 240 hour(s))  Culture, blood (Routine X 2) w Reflex to ID Panel     Status: None   Collection Time: 07/29/22 10:12 AM   Specimen: BLOOD  Result Value Ref Range Status   Specimen Description   Final    BLOOD Performed at [facility], [address], [place], [place] [address]    Special Requests   Final    BLOOD Performed at [facility], [address] [place], [place] [address]    Culture   Final    NO GROWTH 5 DAYS Performed at [facility], [address] [place], [place] [address]    Report Status 08/03/2022 FINAL  Final  Culture, blood (Routine X 2) w Reflex to ID Panel     Status:  None   Collection Time: 07/29/22 10:14 AM   Specimen: BLOOD  Result Value Ref Range Status   Specimen Description   Final    BLOOD Performed at [facility], [address] [place], [place] [address]    Special Requests   Final    BLOOD Performed at [facility], [address], [place], [place] [address]    Culture   Final    NO GROWTH 5 DAYS Performed at [facility], [address] [place], [place] [address]    Report Status 08/03/2022 FINAL  Final  Urine Culture     Status: Abnormal   Collection Time: 07/29/22 10:38 AM   Specimen: Urine, Random  Result Value Ref Range Status   Specimen Description   Final    URINE, RANDOM Performed at [facility], [address] [place], [place] [address]    Special Requests   Final    NONE Performed at [facility], [address] [place], [place] [address]    Culture 10,000 COLONIES/mL ESCHERICHIA COLI (April Carlyon)  Final   Report Status 07/31/2022 FINAL  Final   Organism ID, Bacteria ESCHERICHIA COLI (Mileigh Tilley)  Final      Susceptibility   Escherichia coli - MIC*    AMPICILLIN <=2 SENSITIVE Sensitive     CEFAZOLIN <=4 SENSITIVE Sensitive     CEFEPIME <=0.12 SENSITIVE Sensitive     CEFTRIAXONE <=0.25 SENSITIVE Sensitive     CIPROFLOXACIN <=0.25 SENSITIVE Sensitive     GENTAMICIN <=1 SENSITIVE Sensitive     IMIPENEM <=0.25 SENSITIVE Sensitive     NITROFURANTOIN <=16 SENSITIVE Sensitive     TRIMETH/SULFA <=20 SENSITIVE Sensitive     AMPICILLIN/SULBACTAM <=2 SENSITIVE Sensitive     PIP/TAZO <=4 SENSITIVE Sensitive     *  10,000 COLONIES/mL ESCHERICHIA COLI  Resp panel by RT-PCR (RSV, Flu Makalah Asberry&B, Covid) Anterior Nasal Swab     Status: None   Collection Time: 07/29/22  2:39 PM   Specimen: Anterior Nasal Swab  Result Value Ref Range Status   SARS Coronavirus 2 by RT PCR NEGATIVE NEGATIVE Final    Comment: (NOTE) SARS-CoV-2 target nucleic  acids are NOT DETECTED.  The SARS-CoV-2 RNA is generally detectable in upper respiratory specimens during the acute phase of infection. The lowest concentration of SARS-CoV-2 viral copies this assay can detect is 138 copies/mL. Mozell Hardacre negative result does not preclude SARS-Cov-2 infection and should not be used as the sole basis for treatment or other patient management decisions. Alfredo Collymore negative result may occur with  improper specimen collection/handling, submission of specimen other than nasopharyngeal swab, presence of viral mutation(s) within the areas targeted by this assay, and inadequate number of viral copies(<138 copies/mL). Christyan Reger negative result must be combined with clinical observations, patient history, and epidemiological information. The expected result is Negative.  Fact Sheet for Patients:  BloggerCourse.com  Fact Sheet for Healthcare Providers:  SeriousBroker.it  This test is no t yet approved or cleared by the [place] FDA and  has been authorized for detection and/or diagnosis of SARS-CoV-2 by FDA under an Emergency Use Authorization (EUA). This EUA will remain  in effect (meaning this test can be used) for the duration of the COVID-19 declaration under Section 564(b)(1) of the Act, 21 U.S.C.section 360bbb-3(b)(1), unless the authorization is terminated  or revoked sooner.       Influenza Ceonna Frazzini by PCR NEGATIVE NEGATIVE Final   Influenza B by PCR NEGATIVE NEGATIVE Final    Comment: (NOTE) The Xpert Xpress SARS-CoV-2/FLU/RSV plus assay is intended as an aid in the diagnosis of influenza from Nasopharyngeal swab specimens and should not be used as Ripley Lovecchio sole basis for treatment. Nasal washings and aspirates are unacceptable for Xpert Xpress SARS-CoV-2/FLU/RSV testing.  Fact Sheet for Patients: BloggerCourse.com  Fact Sheet for Healthcare Providers: SeriousBroker.it  This  test is not yet approved or cleared by the [place] FDA and has been authorized for detection and/or diagnosis of SARS-CoV-2 by FDA under an Emergency Use Authorization (EUA). This EUA will remain in effect (meaning this test can be used) for the duration of the COVID-19 declaration under Section 564(b)(1) of the Act, 21 U.S.C. section 360bbb-3(b)(1), unless the authorization is terminated or revoked.     Resp Syncytial Virus by PCR NEGATIVE NEGATIVE Final    Comment: (NOTE) Fact Sheet for Patients: BloggerCourse.com  Fact Sheet for Healthcare Providers: SeriousBroker.it  This test is not yet approved or cleared by the [place] FDA and has been authorized for detection and/or diagnosis of SARS-CoV-2 by FDA under an Emergency Use Authorization (EUA). This EUA will remain in effect (meaning this test can be used) for the duration of the COVID-19 declaration under Section 564(b)(1) of the Act, 21 U.S.C. section 360bbb-3(b)(1), unless the authorization is terminated or revoked.  Performed at [facility], [address] [place], [place] [address]   Group Lun Muro Strep by PCR     Status: None   Collection Time: 07/29/22  2:40 PM   Specimen: Throat; Sterile Swab  Result Value Ref Range Status   Group Jakyia Gaccione Strep by PCR NOT DETECTED NOT DETECTED Final    Comment: Performed at [facility], [address] [place], [place] [address]         Radiology Studies: CT CHEST ABDOMEN PELVIS  W CONTRAST  Result Date: 08/02/2022 CLINICAL DATA:  Sepsis. EXAM: CT CHEST, ABDOMEN, AND PELVIS WITH CONTRAST TECHNIQUE: Multidetector CT imaging of the chest, abdomen and pelvis was performed following the standard protocol during bolus administration of intravenous contrast. RADIATION DOSE REDUCTION: This exam was performed according to the departmental dose-optimization program which includes automated exposure  control, adjustment of the mA and/or kV according to patient size and/or use of iterative reconstruction technique. CONTRAST:  OMNIPAQUE IOHEXOL 300 MG/ML  SOLN COMPARISON:  Chest abdomen pelvis CT 12/05/2021, chest radiograph 07/29/2022 FINDINGS: CT CHEST FINDINGS Cardiovascular: Normal heart size. No pericardial effusion. There are coronary artery calcifications aortic atherosclerosis without acute aortic findings. Mediastinum/Nodes: Shotty mediastinal nodes, including the 12 mm subcarinal lymph node. Patulous distal esophagus. No thyroid nodule. Lungs/Pleura: Multifocal airspace disease. There are tree-in-bud and nodular opacities throughout all lobes of both lungs. More confluent areas of consolidation involving the left lower lobe where there is also septal thickening. No significant pleural effusion. No tracheal or endobronchial debris. Musculoskeletal: There are no acute or suspicious osseous abnormalities. CT ABDOMEN PELVIS FINDINGS Hepatobiliary: The left lobe of the liver is enlarged. There is no focal hepatic abnormality. Gallbladder physiologically distended, no calcified stone. No biliary dilatation. Pancreas: No ductal dilatation or inflammation. Spleen: Enlarged, 14 cm AP dimension.  No focal splenic abnormality. Adrenals/Urinary Tract: Normal adrenal glands. No hydronephrosis or perinephric edema. Homogeneous renal enhancement with symmetric excretion on delayed phase imaging. There is excreted IV contrast in both renal collecting systems in the urinary bladder. Urinary bladder is nondistended. Stomach/Bowel: No bowel obstruction or inflammation. Moderate volume of colonic stool. Normal appendix visualized. The stomach is nondistended. Vascular/Lymphatic: Aortic atherosclerosis. No aneurysm. Patent portal vein. No bulky abdominopelvic adenopathy. Reproductive: Uterus and bilateral adnexa are unremarkable. Other: No ascites. No abdominopelvic collection. There multiple subcutaneous densities in  the anterior abdominal wall, typical of medication injection sites. Musculoskeletal: There are no acute or suspicious osseous abnormalities. IMPRESSION: 1. Multifocal airspace disease with tree-in-bud and nodular opacities throughout all lobes of both lungs. More confluent areas of consolidation involving the left lower lobe where there is also septal thickening. Findings are most consistent with multifocal pneumonia. 2. No acute abnormality in the abdomen/pelvis. 3. Mild splenomegaly. Aortic Atherosclerosis (ICD10-I70.0). Electronically Signed   By: Narda Rutherford M.D.   On: 08/02/2022 22:02   CT ORBITS W CONTRAST  Result Date: 08/02/2022 CLINICAL DATA:  67 year old female with breast cancer, fever and neutropenia. Right orbital cellulitis. EXAM: CT ORBITS WITH CONTRAST TECHNIQUE: Multidetector CT images was performed according to the standard protocol following intravenous contrast administration. RADIATION DOSE REDUCTION: This exam was performed according to the departmental dose-optimization program which includes automated exposure control, adjustment of the mA and/or kV according to patient size and/or use of iterative reconstruction technique. CONTRAST:  75mL OMNIPAQUE IOHEXOL 300 MG/ML  SOLN COMPARISON:  Orbit CT 07/12/2022. FINDINGS: Orbits: Right orbits soft tissue swelling and stranding appears resolved since last month. Globes appear symmetric and intact. Periorbital soft tissues now appears symmetric. Postseptal orbits soft tissues now appears symmetric and within normal limits. Visible paranasal sinuses: Paranasal sinus mucosal thickening and occasional sinus opacification is now more symmetric and bilateral. But no discrete sinus fluid levels or bubbly opacity are identified. In the tympanic cavities and mastoids remain well aerated. Soft tissues: Negative visible pharynx, parapharyngeal spaces, retropharyngeal space, masticator and parotid spaces. Retro maxillary soft tissues also appear  symmetric and normal. Osseous: No acute or suspicious osseous lesion identified. Limited intracranial: Negative. In the  major vascular structures in the visible face and at the skull base are enhancing and appear to be patent. Calcified atherosclerosis at the skull base. IMPRESSION: 1. Essentially resolved right orbital cellulitis since last month. Negative CT appearance of the orbits now. 2. Unresolved paranasal sinus inflammation, now more bilateral and symmetric, but no complicating features are identified. Electronically Signed   By: Odessa Fleming M.D.   On: 08/02/2022 08:32        Scheduled Meds:  citalopram  20 mg Oral Daily   insulin aspart  0-15 Units Subcutaneous TID WC   insulin aspart  0-5 Units Subcutaneous QHS   insulin aspart  5 Units Subcutaneous TID WC   insulin glargine-yfgn  20 Units Subcutaneous BID   losartan  50 mg Oral Daily   metroNIDAZOLE  500 mg Oral Q12H   pantoprazole  40 mg Oral QAC breakfast   Continuous Infusions:  ceFEPime (MAXIPIME) IV 2 g (08/03/22 0549)   micafungin (MYCAMINE) 100 mg in sodium chloride 0.9 % 100 mL IVPB 100 mg (08/02/22 2042)   vancomycin 750 mg (08/03/22 1755)     LOS: 5 days    Time spent: over 30 min    Lacretia Nicks, MD [facility]   To contact the attending provider between 7A-7P or the covering provider during after hours 7P-7A, please log into the web site [email or web address] and access using universal [place] password for that web site. If you do not have the password, please call the hospital operator.  08/03/2022, 6:35 PM

## 2022-08-03 NOTE — Progress Notes (Addendum)
Amanda Duarte   DOB:Jul 27, 1955   WU#:[id number]   VOZ#:[id number]   Heme-onc follow-up note  Subjective: Patient has recurrent fever with chills, she was having 2 episodes when I saw her in late afternoon.  Her appetite is low, no other new complaints.  Objective:  Vitals:   08/03/22 1513 08/03/22 2037  BP: (!) 103/44 (!) 143/60  Pulse: 77 (!) 123  Resp: (!) 22 19  Temp: 99.3 F (37.4 C) (!) 102.9 F (39.4 C)  SpO2: 96% (!) 89%    Body mass index is 22.42 kg/m.  Intake/Output Summary (Last 24 hours) at 08/03/2022 2046 Last data filed at 08/03/2022 0800 Gross per 24 hour  Intake 355 ml  Output --  Net 355 ml     Sclerae unicteric, (+) edema and skin erythema around the right eye  Oropharynx clear  No peripheral adenopathy  Lungs clear -- no rales or rhonchi  Heart regular rate and rhythm  Abdomen benign  MSK no focal spinal tenderness, no peripheral edema  Neuro nonfocal    CBG (last 3)  Recent Labs    08/03/22 1149 08/03/22 1643 08/03/22 2034  GLUCAP 197* 253* 176*     Labs:   Urine Studies No results for input(s): "UHGB", "CRYS" in the last 72 hours.  Invalid input(s): "UACOL", "UAPR", "USPG", "UPH", "UTP", "UGL", "UKET", "UBIL", "UNIT", "UROB", "ULEU", "UEPI", "UWBC", "URBC", "UBAC", "CAST", "UCOM", "BILUA"  Basic Metabolic Panel: Recent Labs  Lab 07/29/22 1438 07/30/22 0412 07/30/22 1010 07/31/22 0332 08/01/22 0322 08/03/22 0711  NA 134*  --  132* 130* 131* 129*  K 3.2*  --  3.6 3.8 3.9 3.4*  CL 103  --  103 102 99 96*  CO2 24  --  21* 20* 22 19*  GLUCOSE 205*  --  187* 123* 241* 212*  BUN 17  --  18 13 15 17   CREATININE 0.58 0.54 0.57 0.54 0.57 0.74  CALCIUM 8.2*  --  8.2* 8.3* 8.6* 8.7*  MG 1.5*  --   --   --   --   --    GFR Estimated Creatinine Clearance: 49 mL/min (by C-G formula based on SCr of 0.74 mg/dL). Liver Function Tests: Recent Labs  Lab 07/29/22 1438 07/31/22 0332 08/01/22 0322  AST 13* 17 20  ALT 12 13 13   ALKPHOS 61 65 72   BILITOT 0.5 0.6 0.4  PROT 7.8 8.2* 8.1  ALBUMIN 3.0* 3.2* 3.0*   Recent Labs  Lab 07/29/22 1438  LIPASE 29   No results for input(s): "AMMONIA" in the last 168 hours. Coagulation profile No results for input(s): "INR", "PROTIME" in the last 168 hours.  CBC: Recent Labs  Lab 07/29/22 0858 07/29/22 0858 07/29/22 1438 07/30/22 1010 07/31/22 0332 08/01/22 0322 08/02/22 1618 08/03/22 0711  WBC 1.5*   < > 1.3* 1.1* 0.7* 1.1* 1.0* 0.7*  NEUTROABS 0.1*  --  0.1* 0.1*  --   --  0.2* 0.2*  HGB 9.8*   < > 8.4* 8.9* 9.0* 8.5* 7.2* 8.2*  HCT 28.8*  --  24.9* 26.6* 27.1* 25.3* 21.4* 24.4*  MCV 83.5  --  84.4 86.1 85.0 84.3 84.6 83.0  PLT 14*   < > 22* 19* 13* 8* 17* 17*   < > = values in this interval not displayed.   Cardiac Enzymes: No results for input(s): "CKTOTAL", "CKMB", "CKMBINDEX", "TROPONINI" in the last 168 hours. BNP: Invalid input(s): "POCBNP" CBG: Recent Labs  Lab 08/02/22 2120 08/03/22 0733 08/03/22 1149 08/03/22  1643 08/03/22 2034  GLUCAP 146* 222* 197* 253* 176*   D-Dimer No results for input(s): "DDIMER" in the last 72 hours. Hgb A1c No results for input(s): "HGBA1C" in the last 72 hours.  Lipid Profile No results for input(s): "CHOL", "HDL", "LDLCALC", "TRIG", "CHOLHDL", "LDLDIRECT" in the last 72 hours. Thyroid function studies No results for input(s): "TSH", "T4TOTAL", "T3FREE", "THYROIDAB" in the last 72 hours.  Invalid input(s): "FREET3" Anemia work up No results for input(s): "VITAMINB12", "FOLATE", "FERRITIN", "TIBC", "IRON", "RETICCTPCT" in the last 72 hours. Microbiology Recent Results (from the past 240 hour(s))  Culture, blood (Routine X 2) w Reflex to ID Panel     Status: None   Collection Time: 07/29/22 10:12 AM   Specimen: BLOOD  Result Value Ref Range Status   Specimen Description   Final    BLOOD Performed at [facility], [address], [place], [place] [address]    Special Requests   Final     BLOOD Performed at [facility], [address] [place], [place] [address]    Culture   Final    NO GROWTH 5 DAYS Performed at [facility], [address] [place], [place] [address]    Report Status 08/03/2022 FINAL  Final  Culture, blood (Routine X 2) w Reflex to ID Panel     Status: None   Collection Time: 07/29/22 10:14 AM   Specimen: BLOOD  Result Value Ref Range Status   Specimen Description   Final    BLOOD Performed at [facility], [address], [place], [place] [address]    Special Requests   Final    BLOOD Performed at [facility], [address] [place], [place] [address]    Culture   Final    NO GROWTH 5 DAYS Performed at [facility], [address] [place], [place] [address]    Report Status 08/03/2022 FINAL  Final  Urine Culture     Status: Abnormal   Collection Time: 07/29/22 10:38 AM   Specimen: Urine, Random  Result Value Ref Range Status   Specimen Description   Final    URINE, RANDOM Performed at [facility], [address] [place], [place] [address]    Special Requests   Final    NONE Performed at [facility], [address], [place], [place] [address]    Culture 10,000 COLONIES/mL ESCHERICHIA COLI (A)  Final   Report Status 07/31/2022 FINAL  Final   Organism ID, Bacteria ESCHERICHIA COLI (A)  Final      Susceptibility   Escherichia coli - MIC*    AMPICILLIN <=2 SENSITIVE Sensitive     CEFAZOLIN <=4 SENSITIVE Sensitive     CEFEPIME <=0.12 SENSITIVE Sensitive     CEFTRIAXONE <=0.25 SENSITIVE Sensitive     CIPROFLOXACIN <=0.25 SENSITIVE Sensitive     GENTAMICIN <=1 SENSITIVE Sensitive     IMIPENEM <=0.25 SENSITIVE Sensitive     NITROFURANTOIN <=16 SENSITIVE Sensitive     TRIMETH/SULFA <=20 SENSITIVE Sensitive     AMPICILLIN/SULBACTAM <=2 SENSITIVE Sensitive     PIP/TAZO <=4 SENSITIVE Sensitive     *  10,000 COLONIES/mL ESCHERICHIA COLI  Resp panel by RT-PCR (RSV, Flu A&B, Covid) Anterior Nasal Swab     Status: None   Collection Time: 07/29/22  2:39 PM   Specimen: Anterior Nasal Swab  Result Value Ref Range Status   SARS  Coronavirus 2 by RT PCR NEGATIVE NEGATIVE Final    Comment: (NOTE) SARS-CoV-2 target nucleic acids are NOT DETECTED.  The SARS-CoV-2 RNA is generally detectable in upper respiratory specimens during the acute phase of infection. The lowest concentration of SARS-CoV-2 viral copies this assay can detect is 138 copies/mL. A negative result does not preclude SARS-Cov-2 infection and should not be used as the sole basis for treatment or other patient management decisions. A negative result may occur with  improper specimen collection/handling, submission of specimen other than nasopharyngeal swab, presence of viral mutation(s) within the areas targeted by this assay, and inadequate number of viral copies(<138 copies/mL). A negative result must be combined with clinical observations, patient history, and epidemiological information. The expected result is Negative.  Fact Sheet for Patients:  BloggerCourse.com  Fact Sheet for Healthcare Providers:  SeriousBroker.it  This test is no t yet approved or cleared by the [place] FDA and  has been authorized for detection and/or diagnosis of SARS-CoV-2 by FDA under an Emergency Use Authorization (EUA). This EUA will remain  in effect (meaning this test can be used) for the duration of the COVID-19 declaration under Section 564(b)(1) of the Act, 21 U.S.C.section 360bbb-3(b)(1), unless the authorization is terminated  or revoked sooner.       Influenza A by PCR NEGATIVE NEGATIVE Final   Influenza B by PCR NEGATIVE NEGATIVE Final    Comment: (NOTE) The Xpert Xpress SARS-CoV-2/FLU/RSV plus assay is intended as an aid in the diagnosis of influenza from Nasopharyngeal  swab specimens and should not be used as a sole basis for treatment. Nasal washings and aspirates are unacceptable for Xpert Xpress SARS-CoV-2/FLU/RSV testing.  Fact Sheet for Patients: BloggerCourse.com  Fact Sheet for Healthcare Providers: SeriousBroker.it  This test is not yet approved or cleared by the [place] FDA and has been authorized for detection and/or diagnosis of SARS-CoV-2 by FDA under an Emergency Use Authorization (EUA). This EUA will remain in effect (meaning this test can be used) for the duration of the COVID-19 declaration under Section 564(b)(1) of the Act, 21 U.S.C. section 360bbb-3(b)(1), unless the authorization is terminated or revoked.     Resp Syncytial Virus by PCR NEGATIVE NEGATIVE Final    Comment: (NOTE) Fact Sheet for Patients: BloggerCourse.com  Fact Sheet for Healthcare Providers: SeriousBroker.it  This test is not yet approved or cleared by the [place] FDA and has been authorized for detection and/or diagnosis of SARS-CoV-2 by FDA under an Emergency Use Authorization (EUA). This EUA will remain in effect (meaning this test can be used) for the duration of the COVID-19 declaration under Section 564(b)(1) of the Act, 21 U.S.C. section 360bbb-3(b)(1), unless the authorization is terminated or revoked.  Performed at [facility], [address] [place], [place] [address]   Group A Strep by PCR     Status: None   Collection Time: 07/29/22  2:40 PM   Specimen: Throat; Sterile Swab  Result Value Ref Range Status   Group A Strep by PCR NOT DETECTED NOT DETECTED Final    Comment: Performed at [facility], [address], [place], [place] [address]      Studies:  CT CHEST ABDOMEN PELVIS W CONTRAST  Result Date: 08/02/2022 CLINICAL DATA:  Sepsis. EXAM: CT CHEST, ABDOMEN, AND PELVIS WITH CONTRAST  TECHNIQUE: Multidetector CT imaging of the chest, abdomen and pelvis was performed following the standard protocol during bolus administration of intravenous contrast. RADIATION DOSE REDUCTION: This exam was performed according  to the departmental dose-optimization program which includes automated exposure control, adjustment of the mA and/or kV according to patient size and/or use of iterative reconstruction technique. CONTRAST:  OMNIPAQUE IOHEXOL 300 MG/ML  SOLN COMPARISON:  Chest abdomen pelvis CT 12/05/2021, chest radiograph 07/29/2022 FINDINGS: CT CHEST FINDINGS Cardiovascular: Normal heart size. No pericardial effusion. There are coronary artery calcifications aortic atherosclerosis without acute aortic findings. Mediastinum/Nodes: Shotty mediastinal nodes, including the 12 mm subcarinal lymph node. Patulous distal esophagus. No thyroid nodule. Lungs/Pleura: Multifocal airspace disease. There are tree-in-bud and nodular opacities throughout all lobes of both lungs. More confluent areas of consolidation involving the left lower lobe where there is also septal thickening. No significant pleural effusion. No tracheal or endobronchial debris. Musculoskeletal: There are no acute or suspicious osseous abnormalities. CT ABDOMEN PELVIS FINDINGS Hepatobiliary: The left lobe of the liver is enlarged. There is no focal hepatic abnormality. Gallbladder physiologically distended, no calcified stone. No biliary dilatation. Pancreas: No ductal dilatation or inflammation. Spleen: Enlarged, 14 cm AP dimension.  No focal splenic abnormality. Adrenals/Urinary Tract: Normal adrenal glands. No hydronephrosis or perinephric edema. Homogeneous renal enhancement with symmetric excretion on delayed phase imaging. There is excreted IV contrast in both renal collecting systems in the urinary bladder. Urinary bladder is nondistended. Stomach/Bowel: No bowel obstruction or inflammation. Moderate volume of colonic stool. Normal  appendix visualized. The stomach is nondistended. Vascular/Lymphatic: Aortic atherosclerosis. No aneurysm. Patent portal vein. No bulky abdominopelvic adenopathy. Reproductive: Uterus and bilateral adnexa are unremarkable. Other: No ascites. No abdominopelvic collection. There multiple subcutaneous densities in the anterior abdominal wall, typical of medication injection sites. Musculoskeletal: There are no acute or suspicious osseous abnormalities. IMPRESSION: 1. Multifocal airspace disease with tree-in-bud and nodular opacities throughout all lobes of both lungs. More confluent areas of consolidation involving the left lower lobe where there is also septal thickening. Findings are most consistent with multifocal pneumonia. 2. No acute abnormality in the abdomen/pelvis. 3. Mild splenomegaly. Aortic Atherosclerosis (ICD10-I70.0). Electronically Signed   By: Narda Rutherford M.D.   On: 08/02/2022 22:02   CT ORBITS W CONTRAST  Result Date: 08/02/2022 CLINICAL DATA:  67 year old female with breast cancer, fever and neutropenia. Right orbital cellulitis. EXAM: CT ORBITS WITH CONTRAST TECHNIQUE: Multidetector CT images was performed according to the standard protocol following intravenous contrast administration. RADIATION DOSE REDUCTION: This exam was performed according to the departmental dose-optimization program which includes automated exposure control, adjustment of the mA and/or kV according to patient size and/or use of iterative reconstruction technique. CONTRAST:  75mL OMNIPAQUE IOHEXOL 300 MG/ML  SOLN COMPARISON:  Orbit CT 07/12/2022. FINDINGS: Orbits: Right orbits soft tissue swelling and stranding appears resolved since last month. Globes appear symmetric and intact. Periorbital soft tissues now appears symmetric. Postseptal orbits soft tissues now appears symmetric and within normal limits. Visible paranasal sinuses: Paranasal sinus mucosal thickening and occasional sinus opacification is now more  symmetric and bilateral. But no discrete sinus fluid levels or bubbly opacity are identified. In the tympanic cavities and mastoids remain well aerated. Soft tissues: Negative visible pharynx, parapharyngeal spaces, retropharyngeal space, masticator and parotid spaces. Retro maxillary soft tissues also appear symmetric and normal. Osseous: No acute or suspicious osseous lesion identified. Limited intracranial: Negative. In the major vascular structures in the visible face and at the skull base are enhancing and appear to be patent. Calcified atherosclerosis at the skull base. IMPRESSION: 1. Essentially resolved right orbital cellulitis since last month. Negative CT appearance of the orbits now. 2. Unresolved paranasal sinus inflammation, now more  bilateral and symmetric, but no complicating features are identified. Electronically Signed   By: Odessa Fleming M.D.   On: 08/02/2022 08:32    Assessment: 67 y.o. 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 -I reviewed her CT scan images from yesterday, which showed multifocal airspace disease, consistent with multifocal pneumonia.  She previously had similar pneumonia and ended up in ICU and intubated. -appreciated ID and hospitalist's excellent care and help managing this difficult condition, her prognosis is poor due to her chronic severe neutropenia and multiple infections in the past, including recent right periorbital cellulitis. -will continue discussed goals of care with patient and her family, she is full code now.  I have previously recommended DNR. -Continue blood transfusion if hemoglobin less than 7.5, and platelet transfusion if platelets less than 15K, or  higher if she has active bleeding  -I will f/u when she is in hospital.   Malachy Mood, MD 08/03/2022

## 2022-08-03 NOTE — Discharge Instructions (Signed)
[facility] [address] [place] [id number] Rental assistance/rental hotline: 4236023532 ext. [phone].    Utility assistance/utility hotline: [phone] ext. [phone] Department of IT consultant (heating/cooling and water assistance) [phone] (rental and utility assistance) [phone]  [employer] - call [phone]

## 2022-08-03 NOTE — Plan of Care (Signed)
  Problem: Clinical Measurements: Goal: Ability to maintain clinical measurements within normal limits will improve Outcome: Progressing Goal: Diagnostic test results will improve Outcome: Progressing   Problem: Activity: Goal: Risk for activity intolerance will decrease Outcome: Progressing   Problem: Safety: Goal: Ability to remain free from injury will improve Outcome: Progressing   

## 2022-08-04 ENCOUNTER — Ambulatory Visit: Payer: Medicare Other | Admitting: Hematology

## 2022-08-04 ENCOUNTER — Inpatient Hospital Stay: Payer: Medicare Other

## 2022-08-04 ENCOUNTER — Other Ambulatory Visit: Payer: Self-pay

## 2022-08-04 ENCOUNTER — Inpatient Hospital Stay (HOSPITAL_COMMUNITY): Payer: Medicare Other

## 2022-08-04 ENCOUNTER — Other Ambulatory Visit: Payer: Medicare Other

## 2022-08-04 DIAGNOSIS — J329 Chronic sinusitis, unspecified: Secondary | ICD-10-CM

## 2022-08-04 DIAGNOSIS — H4030X0 Glaucoma secondary to eye trauma, unspecified eye, stage unspecified: Secondary | ICD-10-CM

## 2022-08-04 DIAGNOSIS — J189 Pneumonia, unspecified organism: Secondary | ICD-10-CM | POA: Diagnosis not present

## 2022-08-04 DIAGNOSIS — D709 Neutropenia, unspecified: Secondary | ICD-10-CM | POA: Diagnosis not present

## 2022-08-04 DIAGNOSIS — R5081 Fever presenting with conditions classified elsewhere: Secondary | ICD-10-CM | POA: Diagnosis not present

## 2022-08-04 LAB — CBC WITH DIFFERENTIAL/PLATELET
Abs Immature Granulocytes: 0.01 10*3/uL (ref 0.00–0.07)
Basophils Absolute: 0 10*3/uL (ref 0.0–0.1)
Basophils Relative: 1 %
Eosinophils Absolute: 0 10*3/uL (ref 0.0–0.5)
Eosinophils Relative: 0 %
HCT: 22.3 % — ABNORMAL LOW (ref 36.0–46.0)
Hemoglobin: 7.2 g/dL — ABNORMAL LOW (ref 12.0–15.0)
Immature Granulocytes: 1 %
Lymphocytes Relative: 63 %
Lymphs Abs: 0.5 10*3/uL — ABNORMAL LOW (ref 0.7–4.0)
MCH: 27.7 pg (ref 26.0–34.0)
MCHC: 32.3 g/dL (ref 30.0–36.0)
MCV: 85.8 fL (ref 80.0–100.0)
Monocytes Absolute: 0.2 10*3/uL (ref 0.1–1.0)
Monocytes Relative: 19 %
Neutro Abs: 0.1 10*3/uL — CL (ref 1.7–7.7)
Neutrophils Relative %: 16 %
Platelets: 9 10*3/uL — CL (ref 150–400)
RBC: 2.6 MIL/uL — ABNORMAL LOW (ref 3.87–5.11)
RDW: 13.7 % (ref 11.5–15.5)
WBC: 0.8 10*3/uL — CL (ref 4.0–10.5)
nRBC: 2.5 % — ABNORMAL HIGH (ref 0.0–0.2)

## 2022-08-04 LAB — COMPREHENSIVE METABOLIC PANEL
ALT: 14 U/L (ref 0–44)
AST: 18 U/L (ref 15–41)
Albumin: 2.5 g/dL — ABNORMAL LOW (ref 3.5–5.0)
Alkaline Phosphatase: 55 U/L (ref 38–126)
Anion gap: 9 (ref 5–15)
BUN: 21 mg/dL (ref 8–23)
CO2: 20 mmol/L — ABNORMAL LOW (ref 22–32)
Calcium: 8.1 mg/dL — ABNORMAL LOW (ref 8.9–10.3)
Chloride: 99 mmol/L (ref 98–111)
Creatinine, Ser: 0.62 mg/dL (ref 0.44–1.00)
GFR, Estimated: >60 mL/min (ref >60)
Glucose, Bld: 132 mg/dL — ABNORMAL HIGH (ref 70–99)
Potassium: 3.2 mmol/L — ABNORMAL LOW (ref 3.5–5.1)
Sodium: 128 mmol/L — ABNORMAL LOW (ref 135–145)
Total Bilirubin: 0.9 mg/dL (ref 0.3–1.2)
Total Protein: 7.3 g/dL (ref 6.5–8.1)

## 2022-08-04 LAB — GLUCOSE, CAPILLARY
Glucose-Capillary: 151 mg/dL — ABNORMAL HIGH (ref 70–99)
Glucose-Capillary: 128 mg/dL — ABNORMAL HIGH (ref 70–99)
Glucose-Capillary: 90 mg/dL (ref 70–99)
Glucose-Capillary: 270 mg/dL — ABNORMAL HIGH (ref 70–99)

## 2022-08-04 LAB — BLOOD GAS, VENOUS
Acid-base deficit: 0.4 mmol/L (ref 0.0–2.0)
Bicarbonate: 22.3 mmol/L (ref 20.0–28.0)
Drawn by: 69272
O2 Saturation: 93.4 %
Patient temperature: 37.7
pCO2, Ven: 31 mmHg — ABNORMAL LOW (ref 44–60)
pH, Ven: 7.47 — ABNORMAL HIGH (ref 7.25–7.43)
pO2, Ven: 60 mmHg — ABNORMAL HIGH (ref 32–45)

## 2022-08-04 LAB — AMMONIA: Ammonia: 57 umol/L — ABNORMAL HIGH (ref 9–35)

## 2022-08-04 LAB — EXPECTORATED SPUTUM ASSESSMENT W GRAM STAIN, RFLX TO RESP C

## 2022-08-04 LAB — PREPARE RBC (CROSSMATCH)

## 2022-08-04 LAB — LACTIC ACID, PLASMA
Lactic Acid, Venous: 1.7 mmol/L (ref 0.5–1.9)
Lactic Acid, Venous: 3.7 mmol/L (ref 0.5–1.9)

## 2022-08-04 LAB — PHOSPHORUS: Phosphorus: 1.8 mg/dL — ABNORMAL LOW (ref 2.5–4.6)

## 2022-08-04 LAB — MAGNESIUM: Magnesium: 1.4 mg/dL — ABNORMAL LOW (ref 1.7–2.4)

## 2022-08-04 MED ORDER — ACETAMINOPHEN 325 MG PO TABS
325.0000 mg | ORAL_TABLET | Freq: Once | ORAL | Status: AC
  Administered 2022-08-04: 325 mg via ORAL
  Filled 2022-08-04: qty 1

## 2022-08-04 MED ORDER — SODIUM CHLORIDE 0.9% IV SOLUTION: Freq: Once | INTRAVENOUS | Status: DC

## 2022-08-04 MED ORDER — ACETAMINOPHEN 650 MG RE SUPP
650.0000 mg | RECTAL | Status: DC | PRN
  Filled 2022-08-04: qty 1

## 2022-08-04 MED ORDER — GUAIFENESIN ER 600 MG PO TB12
600.0000 mg | ORAL_TABLET | Freq: Two times a day (BID) | ORAL | Status: DC | PRN
  Administered 2022-08-04 – 2022-08-13 (×10): 600 mg via ORAL
  Filled 2022-08-04 (×10): qty 1

## 2022-08-04 MED ORDER — POTASSIUM PHOSPHATES 15 MMOLE/5ML IV SOLN
30.0000 mmol | Freq: Once | INTRAVENOUS | Status: AC
  Administered 2022-08-04: 30 mmol via INTRAVENOUS
  Filled 2022-08-04: qty 10

## 2022-08-04 MED ORDER — SODIUM CHLORIDE 0.9% IV SOLUTION
Freq: Once | INTRAVENOUS | Status: AC
  Administered 2022-08-05: 10 mL/h via INTRAVENOUS

## 2022-08-04 MED ORDER — POTASSIUM CHLORIDE CRYS ER 20 MEQ PO TBCR
40.0000 meq | EXTENDED_RELEASE_TABLET | ORAL | Status: AC
  Administered 2022-08-04 (×2): 40 meq via ORAL
  Filled 2022-08-04: qty 4
  Filled 2022-08-04: qty 2

## 2022-08-04 MED ORDER — ACETAMINOPHEN 325 MG PO TABS
650.0000 mg | ORAL_TABLET | ORAL | Status: DC | PRN
  Administered 2022-08-04 – 2022-08-14 (×29): 650 mg via ORAL
  Filled 2022-08-04 (×29): qty 2

## 2022-08-04 MED ORDER — SODIUM CHLORIDE 0.9 % IV SOLN
1.0000 g | Freq: Three times a day (TID) | INTRAVENOUS | Status: DC
  Administered 2022-08-04 – 2022-08-15 (×34): 1 g via INTRAVENOUS
  Filled 2022-08-04 (×35): qty 20

## 2022-08-04 MED ORDER — MAGNESIUM SULFATE 4 GM/100ML IV SOLN
4.0000 g | Freq: Once | INTRAVENOUS | Status: AC
  Administered 2022-08-04: 4 g via INTRAVENOUS
  Filled 2022-08-04: qty 100

## 2022-08-04 MED ORDER — INSULIN GLARGINE-YFGN 100 UNIT/ML ~~LOC~~ SOLN
10.0000 [IU] | Freq: Two times a day (BID) | SUBCUTANEOUS | Status: DC
  Administered 2022-08-04 – 2022-08-05 (×2): 10 [IU] via SUBCUTANEOUS
  Filled 2022-08-04 (×5): qty 0.1

## 2022-08-04 MED ORDER — SODIUM CHLORIDE 0.9% IV SOLUTION
Freq: Once | INTRAVENOUS | Status: AC
  Administered 2022-08-04: 10 mL/h via INTRAVENOUS

## 2022-08-04 MED ORDER — SODIUM CHLORIDE 0.9 % IV BOLUS
500.0000 mL | Freq: Once | INTRAVENOUS | Status: AC
  Administered 2022-08-04: 500 mL via INTRAVENOUS

## 2022-08-04 MED ORDER — SODIUM CHLORIDE 0.9% IV SOLUTION: Freq: Once | INTRAVENOUS | Status: AC

## 2022-08-04 NOTE — Progress Notes (Addendum)
PROGRESS NOTE    Amanda Duarte  ZHY:[id number] DOB: [birth year] DOA: 07/29/2022 PCP: Claiborne Rigg, NP  Chief Complaint  Patient presents with   Fever    Brief Narrative:   67 y.o. female with medical history significant for treated breast cancer, insulin-dependent type 2 diabetes, myelodysplastic syndrome being admitted to the hospital with neutropenic fever.  She presented to oncology clinic with Dr. Mosetta Putt for routine follow-up, complaint of sore fever but no other symptoms, is currently on azacitadine injection. Today in the oncology clinic she was found to have fever 100.4. Lab work was obtained, showed platelets 14,000 was given 1 unit platelet transfusion in the oncology office and then sent to [facility] ED for evaluation  Chest x-ray with mild peribronchial thickening, UA without evidence of infection. Blood and urine cultures were obtained, she was started empirically on IV vancomycin and IV cefepime.   Assessment & Plan:   Principal Problem:   Neutropenic fever (HCC)  Neutropenic fever  Multifocal Pneumonia  E. Coli UTI -persistent fevers -Recently treated for facial cellulitis - CT orbits was again obtained which shows resolution of periorbital cellulitis -CT chest abdomen pelvis with multifocal airspace disease with tree in bud and nodular opacities throughout all lobes of both lungs - multifocal pneumonia -Urine culture grew E. coli, pansensitive -ANC 0.1 -appreciate ID assistance - meropenem, vanc, micafungin  -appreciate pulmonary assistance - planning for bronch 7/12 at 10 AM - negative strep pneumonia ag, urine legionella pending, histoplasma antigen pending, negative crypto ag, fungitell pending - blood cultures NGTD (repeat pending 7/11 am) - appreciate oncology assistance  Mild dysplastic syndrome, high-grade Pancytopenia  Severe Thrombocytopenia  Anemia  Neutropenia as Above -Diagnosed in 10/23 -Pulmonary biopsy showed high-grade myeloid neoplasm -Started on  azacitidine injection -She was [facility] at [facility] for [facility] -Repeat bone marrow biopsy in April 2024 showed persistent high-grade MDS, 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 Sharyne Richters pending per oncology -transfuse 2 units platelets today 7/11 and 1 unit pRBC -Oncology following - transfse for Hb <7.5, platelets < 15,000   Lethargy  Acute Metabolic Encephalopathy  - due to suspected infectious process above - TSH, b12, folate - ammonia is elevated, unclear significance in absence of cirrhosis - will repeat, consider lactulose  UTI -Urine culture grew E. 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, triple negative -Diagnosed in 4/19 -S/p left mastectomy, neoadjuvant chemotherapy and radiation treatment -Had excellent response -Recent mammogram on 7/23 and right axilla ultrasound on 10/23 were benign -She is on surveillance now, followed by oncology as outpatient    DVT prophylaxis: SCD Code Status: full Family Communication: none Disposition:   Status is: Inpatient Remains inpatient appropriate because: need for continued inpatient care   Consultants:  ID oncology  Procedures:  none  Antimicrobials:  Anti-infectives (From admission, onward)    Start     Dose/Rate Route Frequency Ordered Stop   08/04/22 1400  meropenem (MERREM) 1 g in sodium chloride 0.9 % 100 mL IVPB        1 g 200 mL/hr over 30 Minutes Intravenous Every 8 hours 08/04/22 0915     08/02/22 1700  metroNIDAZOLE (FLAGYL) tablet 500 mg  Status:  Discontinued  500 mg Oral Every 12 hours 08/02/22 1446 08/04/22 0915   08/02/22 1600  ceFEPIme (MAXIPIME) 2 g in sodium chloride 0.9 % 100 mL IVPB   Status:  Discontinued        2 g 200 mL/hr over 30 Minutes Intravenous Every 12 hours 08/02/22 1506 08/04/22 0915   08/02/22 1600  vancomycin (VANCOREADY) IVPB 750 mg/150 mL        750 mg 150 mL/hr over 60 Minutes Intravenous Every 24 hours 08/02/22 1520     08/02/22 1000  cefTRIAXone (ROCEPHIN) 2 g in sodium chloride 0.9 % 100 mL IVPB  Status:  Discontinued        2 g 200 mL/hr over 30 Minutes Intravenous Every 24 hours 08/01/22 1841 08/02/22 1506   08/01/22 2000  micafungin (MYCAMINE) 100 mg in sodium chloride 0.9 % 100 mL IVPB        100 mg 105 mL/hr over 1 Hours Intravenous Every 24 hours 08/01/22 1835     07/30/22 1800  vancomycin (VANCOREADY) IVPB 750 mg/150 mL  Status:  Discontinued        750 mg 150 mL/hr over 60 Minutes Intravenous Every 24 hours 07/29/22 1825 07/31/22 0924   07/30/22 0500  ceFEPIme (MAXIPIME) 2 g in sodium chloride 0.9 % 100 mL IVPB        2 g 200 mL/hr over 30 Minutes Intravenous Every 12 hours 07/29/22 1825 08/01/22 2130   07/29/22 1630  ceFEPIme (MAXIPIME) 2 g in sodium chloride 0.9 % 100 mL IVPB        2 g 200 mL/hr over 30 Minutes Intravenous  Once 07/29/22 1617 07/29/22 1818   07/29/22 1630  vancomycin (VANCOCIN) IVPB 1000 mg/200 mL premix        1,000 mg 200 mL/hr over 60 Minutes Intravenous  Once 07/29/22 1628 07/30/22 0749       Subjective: Spanish interpreter used Denies any complaints Discussed with duaghter and updated her  Objective: Vitals:   08/04/22 0658 08/04/22 1010 08/04/22 1348 08/04/22 1415  BP:  (!) 141/62 (!) 131/54 (!) 123/54  Pulse:  (!) 105 (!) 101 100  Resp:   (!) 26 (!) 22  Temp: 100.1 F (37.8 C) 99.8 F (37.7 C) (!) 102.9 F (39.4 C) (!) 103.2 F (39.6 C)  TempSrc: Oral Oral Oral Oral  SpO2:  95% 96% 96%  Weight:      Height:        Intake/Output Summary (Last 24 hours) at 08/04/2022 1517 Last data filed at 08/04/2022 1446 Gross per 24 hour  Intake 1076.09 ml  Output --  Net 1076.09 ml   Filed Weights    07/29/22 1747 07/29/22 1748  Weight: 55 kg 52.1 kg    Examination:  General: No acute distress. Cardiovascular: RRR Lungs: diffuse rhonchorous lung sounds Abdomen: Soft, nontender, nondistended with normal active bowel sounds. No masses. No hepatosplenomegaly. Neurological: sleepy, lethargic, answers questions appropriately when awake Extremities: No clubbing or cyanosis. No edema.  Data Reviewed: I have personally reviewed following labs and imaging studies  CBC: Recent Labs  Lab 07/29/22 1438 07/30/22 1010 07/31/22 0332 08/01/22 0322 08/02/22 1618 08/03/22 0711 08/04/22 0846  WBC 1.3* 1.1* 0.7* 1.1* 1.0* 0.7* 0.8*  NEUTROABS 0.1* 0.1*  --   --  0.2* 0.2* 0.1*  HGB 8.4* 8.9* 9.0* 8.5* 7.2* 8.2* 7.2*  HCT 24.9* 26.6* 27.1* 25.3* 21.4* 24.4* 22.3*  MCV 84.4 86.1 85.0 84.3 84.6 83.0 85.8  PLT 22* 19* 13* 8* 17* 17* 9*  Basic Metabolic Panel: Recent Labs  Lab 07/29/22 1438 07/30/22 0412 07/30/22 1010 07/31/22 0332 08/01/22 0322 08/03/22 0711 08/04/22 0846  NA 134*  --  132* 130* 131* 129* 128*  K 3.2*  --  3.6 3.8 3.9 3.4* 3.2*  CL 103  --  103 102 99 96* 99  CO2 24  --  21* 20* 22 19* 20*  GLUCOSE 205*  --  187* 123* 241* 212* 132*  BUN 17  --  18 13 15 17 21   CREATININE 0.58   < > 0.57 0.54 0.57 0.74 0.62  CALCIUM 8.2*  --  8.2* 8.3* 8.6* 8.7* 8.1*  MG 1.5*  --   --   --   --   --  1.4*  PHOS  --   --   --   --   --   --  1.8*   < > = values in this interval not displayed.    GFR: Estimated Creatinine Clearance: 49 mL/min (by C-G formula based on SCr of 0.62 mg/dL).  Liver Function Tests: Recent Labs  Lab 07/29/22 1438 07/31/22 0332 08/01/22 0322 08/04/22 0846  AST 13* 17 20 18   ALT 12 13 13 14   ALKPHOS 61 65 72 55  BILITOT 0.5 0.6 0.4 0.9  PROT 7.8 8.2* 8.1 7.3  ALBUMIN 3.0* 3.2* 3.0* 2.5*    CBG: Recent Labs  Lab 08/03/22 1149 08/03/22 1643 08/03/22 2034 08/04/22 0739 08/04/22 1214  GLUCAP 197* 253* 176* 151* 128*     Recent  Results (from the past 240 hour(s))  Culture, blood (Routine X 2) w Reflex to ID Panel     Status: None   Collection Time: 07/29/22 10:12 AM   Specimen: BLOOD  Result Value Ref Range Status   Specimen Description   Final    BLOOD Performed at [facility], [address] [place], [place] [address]    Special Requests   Final    BLOOD Performed at [facility], [address], [place], [place] [address]    Culture   Final    NO GROWTH 5 DAYS Performed at [facility], [address] [place], [place] [address]    Report Status 08/03/2022 FINAL  Final  Culture, blood (Routine X 2) w Reflex to ID Panel     Status: None   Collection Time: 07/29/22 10:14 AM   Specimen: BLOOD  Result Value Ref Range Status   Specimen Description   Final    BLOOD Performed at [facility], [address] [place], [place] [address]    Special Requests   Final    BLOOD Performed at [facility], [address] [place], [place] [address]    Culture   Final    NO GROWTH 5 DAYS Performed at [facility], [address] [place], [place] [address]    Report Status 08/03/2022 FINAL  Final  Urine Culture     Status: Abnormal   Collection Time: 07/29/22 10:38 AM   Specimen: Urine, Random  Result Value Ref Range Status   Specimen Description   Final    URINE, RANDOM Performed at [facility], [address] [place], [place] [address]    Special Requests   Final    NONE Performed at [facility], [address], [place], [place] [address]    Culture 10,000 COLONIES/mL ESCHERICHIA COLI (Clovis Warwick)  Final  Report Status 07/31/2022 FINAL  Final   Organism ID, Bacteria ESCHERICHIA COLI (Nikeia Henkes)  Final      Susceptibility   Escherichia coli - MIC*    AMPICILLIN <=2 SENSITIVE Sensitive     CEFAZOLIN <=4 SENSITIVE Sensitive     CEFEPIME <=0.12 SENSITIVE  Sensitive     CEFTRIAXONE <=0.25 SENSITIVE Sensitive     CIPROFLOXACIN <=0.25 SENSITIVE Sensitive     GENTAMICIN <=1 SENSITIVE Sensitive     IMIPENEM <=0.25 SENSITIVE Sensitive     NITROFURANTOIN <=16 SENSITIVE Sensitive     TRIMETH/SULFA <=20 SENSITIVE Sensitive     AMPICILLIN/SULBACTAM <=2 SENSITIVE Sensitive     PIP/TAZO <=4 SENSITIVE Sensitive     * 10,000 COLONIES/mL ESCHERICHIA COLI  Resp panel by RT-PCR (RSV, Flu Kayvon Mo&B, Covid) Anterior Nasal Swab     Status: None   Collection Time: 07/29/22  2:39 PM   Specimen: Anterior Nasal Swab  Result Value Ref Range Status   SARS Coronavirus 2 by RT PCR NEGATIVE NEGATIVE Final    Comment: (NOTE) SARS-CoV-2 target nucleic acids are NOT DETECTED.  The SARS-CoV-2 RNA is generally detectable in upper respiratory specimens during the acute phase of infection. The lowest concentration of SARS-CoV-2 viral copies this assay can detect is 138 copies/mL. Jamiesha Victoria negative result does not preclude SARS-Cov-2 infection and should not be used as the sole basis for treatment or other patient management decisions. Cailean Heacock negative result may occur with  improper specimen collection/handling, submission of specimen other than nasopharyngeal swab, presence of viral mutation(s) within the areas targeted by this assay, and inadequate number of viral copies(<138 copies/mL). Reegan Mctighe negative result must be combined with clinical observations, patient history, and epidemiological information. The expected result is Negative.  Fact Sheet for Patients:  BloggerCourse.com  Fact Sheet for Healthcare Providers:  SeriousBroker.it  This test is no t yet approved or cleared by the [place] FDA and  has been authorized for detection and/or diagnosis of SARS-CoV-2 by FDA under an Emergency Use Authorization (EUA). This EUA will remain  in effect (meaning this test can be used) for the duration of the COVID-19 declaration under  Section 564(b)(1) of the Act, 21 U.S.C.section 360bbb-3(b)(1), unless the authorization is terminated  or revoked sooner.       Influenza Nazli Penn by PCR NEGATIVE NEGATIVE Final   Influenza B by PCR NEGATIVE NEGATIVE Final    Comment: (NOTE) The Xpert Xpress SARS-CoV-2/FLU/RSV plus assay is intended as an aid in the diagnosis of influenza from Nasopharyngeal swab specimens and should not be used as Yoceline Bazar sole basis for treatment. Nasal washings and aspirates are unacceptable for Xpert Xpress SARS-CoV-2/FLU/RSV testing.  Fact Sheet for Patients: BloggerCourse.com  Fact Sheet for Healthcare Providers: SeriousBroker.it  This test is not yet approved or cleared by the [place] FDA and has been authorized for detection and/or diagnosis of SARS-CoV-2 by FDA under an Emergency Use Authorization (EUA). This EUA will remain in effect (meaning this test can be used) for the duration of the COVID-19 declaration under Section 564(b)(1) of the Act, 21 U.S.C. section 360bbb-3(b)(1), unless the authorization is terminated or revoked.     Resp Syncytial Virus by PCR NEGATIVE NEGATIVE Final    Comment: (NOTE) Fact Sheet for Patients: BloggerCourse.com  Fact Sheet for Healthcare Providers: SeriousBroker.it  This test is not yet approved or cleared by the [place] FDA and has been authorized for detection and/or diagnosis of SARS-CoV-2 by FDA under an Emergency Use Authorization (EUA). This EUA will remain in effect (  meaning this test can be used) for the duration of the COVID-19 declaration under Section 564(b)(1) of the Act, 21 U.S.C. section 360bbb-3(b)(1), unless the authorization is terminated or revoked.  Performed at [facility], [address], [name], [place] [address]   Group Jaelah Hauth Strep by PCR     Status: None   Collection Time: 07/29/22  2:40 PM   Specimen:  Throat; Sterile Swab  Result Value Ref Range Status   Group Jerriann Schrom Strep by PCR NOT DETECTED NOT DETECTED Final    Comment: Performed at [facility], [address], [place], [place] [address]         Radiology Studies: DG CHEST PORT 1 VIEW  Result Date: 08/04/2022 CLINICAL DATA:  67 year old female with history of hypertension, cancer and diabetes presenting with signs and symptoms of sepsis. EXAM: PORTABLE CHEST 1 VIEW COMPARISON:  Chest x-ray 07/29/2022. FINDINGS: Patchy ill-defined opacities are more apparent than the prior chest x-ray scattered throughout the lungs bilaterally, most evident in the left lung base, right upper lobe and medial aspect of the right lung base, concerning for progressive multilobar bilateral bronchopneumonia. No definite pleural effusions. No pneumothorax. No evidence of pulmonary edema. Heart size is normal. Upper mediastinal contours are within normal limits. Atherosclerotic calcifications in the thoracic aorta. Surgical clips in the left axilla from prior lymph node dissection. IMPRESSION: 1. Worsening multilobar bilateral bronchopneumonia, as above. 2. Aortic atherosclerosis. Electronically Signed   By: Trudie Reed M.D.   On: 08/04/2022 06:42   CT CHEST ABDOMEN PELVIS W CONTRAST  Result Date: 08/02/2022 CLINICAL DATA:  Sepsis. EXAM: CT CHEST, ABDOMEN, AND PELVIS WITH CONTRAST TECHNIQUE: Multidetector CT imaging of the chest, abdomen and pelvis was performed following the standard protocol during bolus administration of intravenous contrast. RADIATION DOSE REDUCTION: This exam was performed according to the departmental dose-optimization program which includes automated exposure control, adjustment of the mA and/or kV according to patient size and/or use of iterative reconstruction technique. CONTRAST:  OMNIPAQUE IOHEXOL 300 MG/ML  SOLN COMPARISON:  Chest abdomen pelvis CT 12/05/2021, chest radiograph 07/29/2022 FINDINGS: CT CHEST FINDINGS  Cardiovascular: Normal heart size. No pericardial effusion. There are coronary artery calcifications aortic atherosclerosis without acute aortic findings. Mediastinum/Nodes: Shotty mediastinal nodes, including the 12 mm subcarinal lymph node. Patulous distal esophagus. No thyroid nodule. Lungs/Pleura: Multifocal airspace disease. There are tree-in-bud and nodular opacities throughout all lobes of both lungs. More confluent areas of consolidation involving the left lower lobe where there is also septal thickening. No significant pleural effusion. No tracheal or endobronchial debris. Musculoskeletal: There are no acute or suspicious osseous abnormalities. CT ABDOMEN PELVIS FINDINGS Hepatobiliary: The left lobe of the liver is enlarged. There is no focal hepatic abnormality. Gallbladder physiologically distended, no calcified stone. No biliary dilatation. Pancreas: No ductal dilatation or inflammation. Spleen: Enlarged, 14 cm AP dimension.  No focal splenic abnormality. Adrenals/Urinary Tract: Normal adrenal glands. No hydronephrosis or perinephric edema. Homogeneous renal enhancement with symmetric excretion on delayed phase imaging. There is excreted IV contrast in both renal collecting systems in the urinary bladder. Urinary bladder is nondistended. Stomach/Bowel: No bowel obstruction or inflammation. Moderate volume of colonic stool. Normal appendix visualized. The stomach is nondistended. Vascular/Lymphatic: Aortic atherosclerosis. No aneurysm. Patent portal vein. No bulky abdominopelvic adenopathy. Reproductive: Uterus and bilateral adnexa are unremarkable. Other: No ascites. No abdominopelvic collection. There multiple subcutaneous densities in the anterior abdominal wall, typical of medication injection sites. Musculoskeletal: There are no acute or suspicious osseous abnormalities. IMPRESSION: 1.  Multifocal airspace disease with tree-in-bud and nodular opacities throughout all lobes of both lungs. More  confluent areas of consolidation involving the left lower lobe where there is also septal thickening. Findings are most consistent with multifocal pneumonia. 2. No acute abnormality in the abdomen/pelvis. 3. Mild splenomegaly. Aortic Atherosclerosis (ICD10-I70.0). Electronically Signed   By: Narda Rutherford M.D.   On: 08/02/2022 22:02        Scheduled Meds:  [START ON 08/05/2022] sodium chloride   Intravenous Once   citalopram  20 mg Oral Daily   insulin aspart  0-15 Units Subcutaneous TID WC   insulin aspart  0-5 Units Subcutaneous QHS   insulin aspart  5 Units Subcutaneous TID WC   insulin glargine-yfgn  20 Units Subcutaneous BID   losartan  50 mg Oral Daily   pantoprazole  40 mg Oral QAC breakfast   potassium chloride  40 mEq Oral Q4H   Continuous Infusions:  magnesium sulfate bolus IVPB     meropenem (MERREM) IV     micafungin (MYCAMINE) 100 mg in sodium chloride 0.9 % 100 mL IVPB 105 mL/hr at 08/04/22 1446   potassium PHOSPHATE IVPB (in mmol) 85 mL/hr at 08/04/22 1446   vancomycin Stopped (08/03/22 1855)     LOS: 6 days    Time spent: 40 min critical care time    Lacretia Nicks, MD [facility]   To contact the attending provider between 7A-7P or the covering provider during after hours 7P-7A, please log into the web site [email or web address] and access using universal [place] password for that web site. If you do not have the password, please call the hospital operator.  08/04/2022, 3:17 PM

## 2022-08-04 NOTE — Progress Notes (Signed)
    Patient Name: Amanda Duarte           DOB: December 30, 1955  MRN: [id number]      Admission Date: 07/29/2022  Attending Provider: Zigmund Daniel., *  Primary Diagnosis: Neutropenic fever [facility])   Level of care: Progressive    CROSS COVER NOTE   Date of Service   08/04/2022   Amanda Duarte, 67 y.o. female, was admitted on 07/29/2022 for Neutropenic fever (HCC).    HPI/Events of Note   Neutropenic fever--> febrile tonight, 99 F - 103.2 F Multifocal pneumonia and E. coli UTI Blood cultures NGTD ID following, currently on cefepime, vancomycin, micafungin, Flagyl Given febrile state, repeat blood cultures, inc Tylenol frequency to every 4 hours, obtain lactic acid Requiring 2 L nasal cannula for optimal saturation.  New cough per patient.  Chest x-ray pending.  Sputum culture pending.   Interventions/ Plan   Tylenol Q4 Oxygen as needed Repeat blood cultures Lactic acid-negative Chest x-ray Sputum culture      Anthoney Harada, DNP, ACNPC- AG [facility] [place] [facility]

## 2022-08-04 NOTE — Progress Notes (Signed)
Subjective:  Still having high fevers   Antibiotics:  Anti-infectives (From admission, onward)    Start     Dose/Rate Route Frequency Ordered Stop   08/04/22 1400  meropenem (MERREM) 1 g in sodium chloride 0.9 % 100 mL IVPB        1 g 200 mL/hr over 30 Minutes Intravenous Every 8 hours 08/04/22 0915     08/02/22 1700  metroNIDAZOLE (FLAGYL) tablet 500 mg  Status:  Discontinued        500 mg Oral Every 12 hours 08/02/22 1446 08/04/22 0915   08/02/22 1600  ceFEPIme (MAXIPIME) 2 g in sodium chloride 0.9 % 100 mL IVPB  Status:  Discontinued        2 g 200 mL/hr over 30 Minutes Intravenous Every 12 hours 08/02/22 1506 08/04/22 0915   08/02/22 1600  vancomycin (VANCOREADY) IVPB 750 mg/150 mL        750 mg 150 mL/hr over 60 Minutes Intravenous Every 24 hours 08/02/22 1520     08/02/22 1000  cefTRIAXone (ROCEPHIN) 2 g in sodium chloride 0.9 % 100 mL IVPB  Status:  Discontinued        2 g 200 mL/hr over 30 Minutes Intravenous Every 24 hours 08/01/22 1841 08/02/22 1506   08/01/22 2000  micafungin (MYCAMINE) 100 mg in sodium chloride 0.9 % 100 mL IVPB        100 mg 105 mL/hr over 1 Hours Intravenous Every 24 hours 08/01/22 1835     07/30/22 1800  vancomycin (VANCOREADY) IVPB 750 mg/150 mL  Status:  Discontinued        750 mg 150 mL/hr over 60 Minutes Intravenous Every 24 hours 07/29/22 1825 07/31/22 0924   07/30/22 0500  ceFEPIme (MAXIPIME) 2 g in sodium chloride 0.9 % 100 mL IVPB        2 g 200 mL/hr over 30 Minutes Intravenous Every 12 hours 07/29/22 1825 08/01/22 2130   07/29/22 1630  ceFEPIme (MAXIPIME) 2 g in sodium chloride 0.9 % 100 mL IVPB        2 g 200 mL/hr over 30 Minutes Intravenous  Once 07/29/22 1617 07/29/22 1818   07/29/22 1630  vancomycin (VANCOCIN) IVPB 1000 mg/200 mL premix        1,000 mg 200 mL/hr over 60 Minutes Intravenous  Once 07/29/22 1628 07/30/22 0749       Medications: Scheduled Meds:  [START ON 08/05/2022] sodium chloride   Intravenous Once    sodium chloride   Intravenous Once   citalopram  20 mg Oral Daily   insulin aspart  0-15 Units Subcutaneous TID WC   insulin aspart  0-5 Units Subcutaneous QHS   insulin aspart  5 Units Subcutaneous TID WC   insulin glargine-yfgn  10 Units Subcutaneous BID   losartan  50 mg Oral Daily   pantoprazole  40 mg Oral QAC breakfast   Continuous Infusions:  magnesium sulfate bolus IVPB 4 g (08/04/22 1735)   meropenem (MERREM) IV Stopped (08/04/22 1745)   micafungin (MYCAMINE) 100 mg in sodium chloride 0.9 % 100 mL IVPB 105 mL/hr at 08/04/22 1756   vancomycin Stopped (08/03/22 1855)   PRN Meds:.acetaminophen **OR** acetaminophen, albuterol, guaiFENesin, ondansetron **OR** ondansetron (ZOFRAN) IV, oxyCODONE, traZODone    Objective: Weight change:   Intake/Output Summary (Last 24 hours) at 08/04/2022 1758 Last data filed at 08/04/2022 1756 Gross per 24 hour  Intake 1799.49 ml  Output --  Net 1799.49 ml   Blood  pressure (!) 109/59, pulse 87, temperature 98.5 F (36.9 C), temperature source Oral, resp. rate (!) 22, height 5' (1.524 m), weight 52.1 kg, SpO2 96%. Temp:  [98.5 F (36.9 C)-103.2 F (39.6 C)] 98.5 F (36.9 C) (07/11 1634) Pulse Rate:  [87-123] 87 (07/11 1634) Resp:  [18-26] 22 (07/11 1415) BP: (102-143)/(50-76) 109/59 (07/11 1532) SpO2:  [89 %-96 %] 96 % (07/11 1532)  Physical Exam: Physical Exam Constitutional:      General: She is not in acute distress.    Appearance: She is well-developed. She is not diaphoretic.  HENT:     Head: Normocephalic and atraumatic.     Right Ear: External ear normal.     Left Ear: External ear normal.     Mouth/Throat:     Pharynx: No oropharyngeal exudate.  Eyes:     General: No scleral icterus.    Conjunctiva/sclera:     Right eye: Exudate present.     Pupils: Pupils are equal, round, and reactive to light.  Cardiovascular:     Rate and Rhythm: Normal rate and regular rhythm.     Heart sounds: Normal heart sounds. No murmur  heard.    No friction rub. No gallop.  Pulmonary:     Effort: Pulmonary effort is normal. No respiratory distress.     Breath sounds: Wheezing present. No rales.  Abdominal:     General: Bowel sounds are normal. There is no distension.     Palpations: Abdomen is soft.     Tenderness: There is no abdominal tenderness. There is no rebound.  Musculoskeletal:        General: No tenderness. Normal range of motion.  Lymphadenopathy:     Cervical: No cervical adenopathy.  Skin:    General: Skin is warm and dry.     Coloration: Skin is not pale.     Findings: No erythema or rash.  Neurological:     General: No focal deficit present.     Mental Status: She is alert and oriented to person, place, and time.     Motor: No abnormal muscle tone.     Coordination: Coordination normal.  Psychiatric:        Mood and Affect: Mood normal.        Behavior: Behavior normal.        Thought Content: Thought content normal.        Judgment: Judgment normal.      CBC:    BMET Recent Labs    08/03/22 0711 08/04/22 0846  NA 129* 128*  K 3.4* 3.2*  CL 96* 99  CO2 19* 20*  GLUCOSE 212* 132*  BUN 17 21  CREATININE 0.74 0.62  CALCIUM 8.7* 8.1*     Liver Panel  Recent Labs    08/04/22 0846  PROT 7.3  ALBUMIN 2.5*  AST 18  ALT 14  ALKPHOS 55  BILITOT 0.9       Sedimentation Rate No results for input(s): "ESRSEDRATE" in the last 72 hours. C-Reactive Protein No results for input(s): "CRP" in the last 72 hours.  Micro Results: Recent Results (from the past 720 hour(s))  Culture, blood (routine x 2)     Status: None   Collection Time: 07/09/22  3:38 PM   Specimen: BLOOD  Result Value Ref Range Status   Specimen Description   Final    BLOOD SITE NOT SPECIFIED Performed at [facility], [address] [place], [place] [address]    Special Requests  Final    BOTTLES DRAWN AEROBIC AND ANAEROBIC Blood Culture adequate volume Performed at [facility], [address] [place], [place] [address]    Culture   Final    NO GROWTH 5 DAYS Performed at [facility], [address] [place], [place] [address]    Report Status 07/14/2022 FINAL  Final  Culture, blood (routine x 2)     Status: None   Collection Time: 07/09/22  3:43 PM   Specimen: BLOOD  Result Value Ref Range Status   Specimen Description   Final    BLOOD LEFT ANTECUBITAL Performed at [facility], [address], [place], [place] [address]    Special Requests   Final    BOTTLES DRAWN AEROBIC AND ANAEROBIC Blood Culture adequate volume Performed at [facility], [address] [place], [place] [address]    Culture   Final    NO GROWTH 5 DAYS Performed at [facility], [address] [place], [place] [address]    Report Status 07/14/2022 FINAL  Final  Culture, blood (Routine X 2) w Reflex to ID Panel     Status: None   Collection Time: 07/29/22 10:12 AM   Specimen: BLOOD  Result Value Ref Range Status   Specimen Description   Final    BLOOD Performed at [facility], [address] [place], [place] [address]    Special Requests   Final    BLOOD Performed at [facility], [address] [place], [place] [address]    Culture   Final    NO GROWTH 5 DAYS Performed at [facility], [address], [place], [place] [address]    Report Status 08/03/2022 FINAL  Final  Culture, blood (Routine X 2) w Reflex to ID Panel     Status: None   Collection Time: 07/29/22 10:14 AM   Specimen: BLOOD  Result Value Ref Range Status   Specimen Description   Final    BLOOD Performed at [facility], [address], [place], [place] [address]    Special Requests   Final    BLOOD Performed at [facility], [address], [place], [place] [address]    Culture   Final    NO GROWTH 5 DAYS Performed at  [facility], [address] [place], [place] [address]    Report Status 08/03/2022 FINAL  Final  Urine Culture     Status: Abnormal   Collection Time: 07/29/22 10:38 AM   Specimen: Urine, Random  Result Value Ref Range Status   Specimen Description   Final    URINE, RANDOM Performed at [facility], [address], [place], [place] [address]    Special Requests   Final    NONE Performed at [facility], [address], [place], [place] [address]    Culture 10,000 COLONIES/mL ESCHERICHIA COLI (A)  Final   Report Status 07/31/2022 FINAL  Final   Organism ID, Bacteria ESCHERICHIA COLI (A)  Final      Susceptibility   Escherichia coli - MIC*    AMPICILLIN <=2 SENSITIVE Sensitive     CEFAZOLIN <=4 SENSITIVE Sensitive     CEFEPIME <=0.12 SENSITIVE Sensitive     CEFTRIAXONE <=0.25 SENSITIVE Sensitive     CIPROFLOXACIN <=0.25 SENSITIVE Sensitive     GENTAMICIN <=1 SENSITIVE Sensitive  IMIPENEM <=0.25 SENSITIVE Sensitive     NITROFURANTOIN <=16 SENSITIVE Sensitive     TRIMETH/SULFA <=20 SENSITIVE Sensitive     AMPICILLIN/SULBACTAM <=2 SENSITIVE Sensitive     PIP/TAZO <=4 SENSITIVE Sensitive     * 10,000 COLONIES/mL ESCHERICHIA COLI  Resp panel by RT-PCR (RSV, Flu A&B, Covid) Anterior Nasal Swab     Status: None   Collection Time: 07/29/22  2:39 PM   Specimen: Anterior Nasal Swab  Result Value Ref Range Status   SARS Coronavirus 2 by RT PCR NEGATIVE NEGATIVE Final    Comment: (NOTE) SARS-CoV-2 target nucleic acids are NOT DETECTED.  The SARS-CoV-2 RNA is generally detectable in upper respiratory specimens during the acute phase of infection. The lowest concentration of SARS-CoV-2 viral copies this assay can detect is 138 copies/mL. A negative result does not preclude SARS-Cov-2 infection and should not be used as the sole basis for treatment or other patient management decisions. A negative result may occur with   improper specimen collection/handling, submission of specimen other than nasopharyngeal swab, presence of viral mutation(s) within the areas targeted by this assay, and inadequate number of viral copies(<138 copies/mL). A negative result must be combined with clinical observations, patient history, and epidemiological information. The expected result is Negative.  Fact Sheet for Patients:  BloggerCourse.com  Fact Sheet for Healthcare Providers:  SeriousBroker.it  This test is no t yet approved or cleared by the [place] FDA and  has been authorized for detection and/or diagnosis of SARS-CoV-2 by FDA under an Emergency Use Authorization (EUA). This EUA will remain  in effect (meaning this test can be used) for the duration of the COVID-19 declaration under Section 564(b)(1) of the Act, 21 U.S.C.section 360bbb-3(b)(1), unless the authorization is terminated  or revoked sooner.       Influenza A by PCR NEGATIVE NEGATIVE Final   Influenza B by PCR NEGATIVE NEGATIVE Final    Comment: (NOTE) The Xpert Xpress SARS-CoV-2/FLU/RSV plus assay is intended as an aid in the diagnosis of influenza from Nasopharyngeal swab specimens and should not be used as a sole basis for treatment. Nasal washings and aspirates are unacceptable for Xpert Xpress SARS-CoV-2/FLU/RSV testing.  Fact Sheet for Patients: BloggerCourse.com  Fact Sheet for Healthcare Providers: SeriousBroker.it  This test is not yet approved or cleared by the [place] FDA and has been authorized for detection and/or diagnosis of SARS-CoV-2 by FDA under an Emergency Use Authorization (EUA). This EUA will remain in effect (meaning this test can be used) for the duration of the COVID-19 declaration under Section 564(b)(1) of the Act, 21 U.S.C. section 360bbb-3(b)(1), unless the authorization is terminated or revoked.      Resp Syncytial Virus by PCR NEGATIVE NEGATIVE Final    Comment: (NOTE) Fact Sheet for Patients: BloggerCourse.com  Fact Sheet for Healthcare Providers: SeriousBroker.it  This test is not yet approved or cleared by the [place] FDA and has been authorized for detection and/or diagnosis of SARS-CoV-2 by FDA under an Emergency Use Authorization (EUA). This EUA will remain in effect (meaning this test can be used) for the duration of the COVID-19 declaration under Section 564(b)(1) of the Act, 21 U.S.C. section 360bbb-3(b)(1), unless the authorization is terminated or revoked.  Performed at [facility], [address] [place], [place] [address]   Group A Strep by PCR     Status: None   Collection Time: 07/29/22  2:40 PM   Specimen: Throat; Sterile Swab  Result Value Ref Range Status  Group A Strep by PCR NOT DETECTED NOT DETECTED Final    Comment: Performed at [facility], [address], [place], [place] [address]    Studies/Results: DG CHEST PORT 1 VIEW  Result Date: 08/04/2022 CLINICAL DATA:  67 year old female with history of hypertension, cancer and diabetes presenting with signs and symptoms of sepsis. EXAM: PORTABLE CHEST 1 VIEW COMPARISON:  Chest x-ray 07/29/2022. FINDINGS: Patchy ill-defined opacities are more apparent than the prior chest x-ray scattered throughout the lungs bilaterally, most evident in the left lung base, right upper lobe and medial aspect of the right lung base, concerning for progressive multilobar bilateral bronchopneumonia. No definite pleural effusions. No pneumothorax. No evidence of pulmonary edema. Heart size is normal. Upper mediastinal contours are within normal limits. Atherosclerotic calcifications in the thoracic aorta. Surgical clips in the left axilla from prior lymph node dissection. IMPRESSION: 1. Worsening multilobar bilateral bronchopneumonia, as  above. 2. Aortic atherosclerosis. Electronically Signed   By: Trudie Reed M.D.   On: 08/04/2022 06:42   CT CHEST ABDOMEN PELVIS W CONTRAST  Result Date: 08/02/2022 CLINICAL DATA:  Sepsis. EXAM: CT CHEST, ABDOMEN, AND PELVIS WITH CONTRAST TECHNIQUE: Multidetector CT imaging of the chest, abdomen and pelvis was performed following the standard protocol during bolus administration of intravenous contrast. RADIATION DOSE REDUCTION: This exam was performed according to the departmental dose-optimization program which includes automated exposure control, adjustment of the mA and/or kV according to patient size and/or use of iterative reconstruction technique. CONTRAST:  OMNIPAQUE IOHEXOL 300 MG/ML  SOLN COMPARISON:  Chest abdomen pelvis CT 12/05/2021, chest radiograph 07/29/2022 FINDINGS: CT CHEST FINDINGS Cardiovascular: Normal heart size. No pericardial effusion. There are coronary artery calcifications aortic atherosclerosis without acute aortic findings. Mediastinum/Nodes: Shotty mediastinal nodes, including the 12 mm subcarinal lymph node. Patulous distal esophagus. No thyroid nodule. Lungs/Pleura: Multifocal airspace disease. There are tree-in-bud and nodular opacities throughout all lobes of both lungs. More confluent areas of consolidation involving the left lower lobe where there is also septal thickening. No significant pleural effusion. No tracheal or endobronchial debris. Musculoskeletal: There are no acute or suspicious osseous abnormalities. CT ABDOMEN PELVIS FINDINGS Hepatobiliary: The left lobe of the liver is enlarged. There is no focal hepatic abnormality. Gallbladder physiologically distended, no calcified stone. No biliary dilatation. Pancreas: No ductal dilatation or inflammation. Spleen: Enlarged, 14 cm AP dimension.  No focal splenic abnormality. Adrenals/Urinary Tract: Normal adrenal glands. No hydronephrosis or perinephric edema. Homogeneous renal enhancement with symmetric excretion  on delayed phase imaging. There is excreted IV contrast in both renal collecting systems in the urinary bladder. Urinary bladder is nondistended. Stomach/Bowel: No bowel obstruction or inflammation. Moderate volume of colonic stool. Normal appendix visualized. The stomach is nondistended. Vascular/Lymphatic: Aortic atherosclerosis. No aneurysm. Patent portal vein. No bulky abdominopelvic adenopathy. Reproductive: Uterus and bilateral adnexa are unremarkable. Other: No ascites. No abdominopelvic collection. There multiple subcutaneous densities in the anterior abdominal wall, typical of medication injection sites. Musculoskeletal: There are no acute or suspicious osseous abnormalities. IMPRESSION: 1. Multifocal airspace disease with tree-in-bud and nodular opacities throughout all lobes of both lungs. More confluent areas of consolidation involving the left lower lobe where there is also septal thickening. Findings are most consistent with multifocal pneumonia. 2. No acute abnormality in the abdomen/pelvis. 3. Mild splenomegaly. Aortic Atherosclerosis (ICD10-I70.0). Electronically Signed   By: Narda Rutherford M.D.   On: 08/02/2022 22:02      Assessment/Plan:  INTERVAL HISTORY:    Fevers still quite high  I was able to  check in with Elder Love , MD at [facility] and the patient has TWO MRN"s that are being merged with 2 different last names. The one with [place] does NOT link to Care Everywhere with [facility] but the other one does  Principal Problem:   Neutropenic fever (HCC)    Amanda Duarte is a 67 y.o. female with breast cancer then myelodysplastic syndrome, complicated by profound pancytopenia after being given azacitidine injection on November 15, 2021 , with hospitalization with neutropenic fevers and prolonged stay involving intubation and mechanical ventilation, QT prolongation on voriconazole, then admission in June 2024 with what was thought to be orbital cellulitis with transfer to [facility].  I still cannot see records in Care Everywhere but I was able to review with Ronald Lobo, MD at [facility]  Patient was not seen by ID at [facility] but was by Ophthalmology and ENT. She was found to have dacrocystitis and underwent bedside I and D by Ophthalmology with no purulence encountered by Staph capitis found on culture  ENT did endoscopy and found no purulence. Patient was DC on augmentin and topical erythromycin   Chest abdomen pelvis shows multiple lung nodules laterally with some consolidation and some areas  #1 Neutropenic fevers:  Based on imaging's lungs seem to be the source of her infection      Her problems with QT prolongation does complicate optimal antifungal coverage for her but I think right now I am okay with continuing the echinocandin  therapy  Pneumococcal antigen is negative, cryptococcal antigen negative  Legionella antigen Fungitell Aspergillus antigen,histoplsma ag still pending  I have asked Dr. Vassie Loll to take a look at her formally with idea of obtaining bronchoscopy where via BAl we can obtain  Bacterial cultures, AFB cultures, fungal cultures, PCP smear, aspergillus ag on BAL  We broadened to vancomycin, micafungin and now merrem  # 2 Dacrocystitis: still with some exudate but minimal   #3 Sinusitis seen on imaging but no c/o sinus pathology  May need to have ENT see her here too to make sure that there is no invasive mold playing a role here  I have personally spent 50 minutes involved in face-to-face and non-face-to-face activities for this patient on the day of the visit. Professional time spent includes the following activities: Preparing to see the patient (review of tests), Obtaining and/or reviewing separately obtained history (admission/discharge record), Performing a medically appropriate examination and/or evaluation , Ordering medications/tests/procedures, referring and communicating with other health care  professionals, Documenting clinical information in the EMR, Independently interpreting results (not separately reported), Communicating results to the patient/family/caregiver, Counseling and educating the patient/family/caregiver and Care coordination (not separately reported).   Dr. Elinor Parkinson is going to check in on the patient tomorrow.    LOS: 6 days   Acey Lav 08/04/2022, 5:58 PM

## 2022-08-04 NOTE — Progress Notes (Signed)
   08/03/22 2037  Assess: MEWS Score  Temp (!) 102.9 F (39.4 C)  BP (!) 143/60  MAP (mmHg) 84  Pulse Rate (!) 123  Resp 19  SpO2 (!) 89 %  O2 Device Room Air  Assess: MEWS Score  MEWS Temp 2  MEWS Systolic 0  MEWS Pulse 2  MEWS RR 0  MEWS LOC 0  MEWS Score 4  MEWS Score Color Red  Assess: if the MEWS score is Yellow or Red  Were vital signs taken at a resting state? Yes  Focused Assessment Change from prior assessment (see assessment flowsheet)  Does the patient meet 2 or more of the SIRS criteria? Yes  Does the patient have a confirmed or suspected source of infection? Yes  MEWS guidelines implemented  Yes, red  Treat  MEWS Interventions Considered administering scheduled or prn medications/treatments as ordered  Take Vital Signs  Increase Vital Sign Frequency  Red: Q1hr x2, continue Q4hrs until patient remains green for 12hrs  Escalate  MEWS: Escalate Red: Discuss with charge nurse and notify provider. Consider notifying RRT. If remains red for 2 hours consider need for higher level of care  Notify: Charge Nurse/RN  Name of Charge Nurse/RN Notified Lauren  Provider Notification  Provider Name/Title Cammy Copa ,NP  Date Provider Notified 08/03/22  Time Provider Notified 2057  Method of Notification  (Message)  Notification Reason Change in status  Provider response No new orders  Date of Provider Response 08/03/22  Time of Provider Response 2100  Notify: Rapid Response  Name of Rapid Response RN Notified Timber, RN  Date Rapid Response Notified 08/03/22  Time Rapid Response Notified 2050  Assess: SIRS CRITERIA  SIRS Temperature  1  SIRS Pulse 1  SIRS Respirations  0  SIRS WBC 0  SIRS Score Sum  2   Pt continuing to fever intermittently. Red MEWS started, NP, RR, Charge Rn made aware. Ice packs placed and Tylenol administered.

## 2022-08-04 NOTE — Consult Note (Addendum)
NAME:  Amanda Duarte, MRN:  [id number], DOB:  04-26-1955, LOS: 6 ADMISSION DATE:  07/29/2022, CONSULTATION DATE:  08/04/2022 REFERRING MD:  Dr. Lowell Guitar - [facility], CHIEF COMPLAINT: Multifocal pneumonia  History of Present Illness:  Amanda Duarte is a 67 year old female with past medical history significant for left breast cancer, type 2 diabetes, myelodysplastic syndrome HTN, HLD, and prior stroke who initially presented to the emergency department from oncology office in 7/5 with fever.  On presentation to oncology office patient was seen to have a temperature of 100.4 with thrombocytopenia.  He is admitted per hospitalist service for management of possible underlying infection, started empirically on IV vancomycin and cefepime.  Mid morning of 7/11 PCCM consulted for worsening fever with Tmax 103.2 in the setting of multifocal pneumonia and E. coli UTI.  PCCM consulted for assistance in management.  Pertinent  Medical History   left breast cancer, type 2 diabetes, myelodysplastic syndrome HTN, HLD, and prior stroke  Significant Hospital Events: Including procedures, antibiotic start and stop dates in addition to other pertinent events   7/05 during outpatient oncology follow-up patient found to be febrile and thrombocytopenic started on empiric antibiotics UA negative for bacteria, positive protein, negative nitrates and leukocytes.  Urine culture positive for 10,000 colonies of pansensitive E. coli 7/9 ID consulted and recommended advanced imaging CT chest/abdomen/pelvis with signs of multifocal pneumonia 7/11 or worsening fever with Tmax 103.2, PCCM consulted for assistance in bronchoscopy  Interim History / Subjective:   History obtained via Spanish interpreter  Objective   Blood pressure 137/60, pulse (!) 107, temperature 100.1 F (37.8 C), temperature source Oral, resp. rate 20, height 5' (1.524 m), weight 52.1 kg, SpO2 94%.        Intake/Output Summary (Last 24 hours) at 08/04/2022  4098 Last data filed at 08/04/2022 0700 Gross per 24 hour  Intake 775.05 ml  Output --  Net 775.05 ml   Filed Weights   07/29/22 1747 07/29/22 1748  Weight: 55 kg 52.1 kg    Examination: General: Elderly woman lying in bed, no distress on nasal cannula HENT: Pallor, no icterus, no JVD Lungs: Clear breath sounds bilateral, no accessory muscle use Cardiovascular: S1-S2 reg Abdomen: Soft, nontender, no organomegaly Extremities: No deformity, no edema Neuro: Alert, interactive, nonfocal   Labs show hypokalemia, hyponatremia, hypophosphatemia, hypomagnesemia, neutropenia with ANC 100, platelets 9K  Resolved Hospital Problem list     Assessment & Plan:  Multifocal pneumonia Neutropenic fever Recently treated for periorbital cellulitis P: Tentative plan to perform bronchoscopy 7/12 at 10 AM Due to thrombocytopenia will need platelet transfusion prior to procedure -2 units ordered for 7 AM on 7/12 N.p.o. at midnight Antibiotics per ID, previously she had prolonged QT but empiric voriconazole was used   I discussed risks and benefits of bronchoscopy procedure especially in the setting of severe thrombocytopenia.  I also coordinated with hematology regarding platelet transfusions prior to procedure.  I discussed her situation in detail with infectious disease consultant She had previous bronchoscopy 11/2021 when she required mechanical ventilation, Aspergillus antigen was negative then  Hypokalemia, hypophosphatemia and hypomagnesemia will be repleted  Best Practice (right click and "Reselect all SmartList Selections" daily)  Per primary  Labs   CBC: Recent Labs  Lab 07/29/22 1438 07/30/22 1010 07/31/22 0332 08/01/22 0322 08/02/22 1618 08/03/22 0711 08/04/22 0846  WBC 1.3* 1.1* 0.7* 1.1* 1.0* 0.7* 0.8*  NEUTROABS 0.1* 0.1*  --   --  0.2* 0.2* 0.1*  HGB 8.4* 8.9* 9.0* 8.5* 7.2*  8.2* 7.2*  HCT 24.9* 26.6* 27.1* 25.3* 21.4* 24.4* 22.3*  MCV 84.4 86.1 85.0 84.3 84.6 83.0  85.8  PLT 22* 19* 13* 8* 17* 17* 9*    Basic Metabolic Panel: Recent Labs  Lab 07/29/22 1438 07/30/22 0412 07/30/22 1010 07/31/22 0332 08/01/22 0322 08/03/22 0711 08/04/22 0846  NA 134*  --  132* 130* 131* 129* 128*  K 3.2*  --  3.6 3.8 3.9 3.4* 3.2*  CL 103  --  103 102 99 96* 99  CO2 24  --  21* 20* 22 19* 20*  GLUCOSE 205*  --  187* 123* 241* 212* 132*  BUN 17  --  18 13 15 17 21   CREATININE 0.58   < > 0.57 0.54 0.57 0.74 0.62  CALCIUM 8.2*  --  8.2* 8.3* 8.6* 8.7* 8.1*  MG 1.5*  --   --   --   --   --  1.4*  PHOS  --   --   --   --   --   --  1.8*   < > = values in this interval not displayed.   GFR: Estimated Creatinine Clearance: 49 mL/min (by C-G formula based on SCr of 0.62 mg/dL). Recent Labs  Lab 08/01/22 0322 08/02/22 1618 08/03/22 0711 08/04/22 0534 08/04/22 0846  WBC 1.1* 1.0* 0.7*  --  0.8*  LATICACIDVEN  --   --   --  1.7  --     Liver Function Tests: Recent Labs  Lab 07/29/22 1438 07/31/22 0332 08/01/22 0322 08/04/22 0846  AST 13* 17 20 18   ALT 12 13 13 14   ALKPHOS 61 65 72 55  BILITOT 0.5 0.6 0.4 0.9  PROT 7.8 8.2* 8.1 7.3  ALBUMIN 3.0* 3.2* 3.0* 2.5*   Recent Labs  Lab 07/29/22 1438  LIPASE 29   No results for input(s): "AMMONIA" in the last 168 hours.  ABG    Component Value Date/Time   PHART 7.39 12/07/2021 1130   PCO2ART 36 12/07/2021 1130   PO2ART 101 12/07/2021 1130   HCO3 21.8 12/07/2021 1130   TCO2 30 05/31/2017 1113   ACIDBASEDEF 2.6 (H) 12/07/2021 1130   O2SAT 98.7 12/07/2021 1130     Coagulation Profile: No results for input(s): "INR", "PROTIME" in the last 168 hours.  Cardiac Enzymes: No results for input(s): "CKTOTAL", "CKMB", "CKMBINDEX", "TROPONINI" in the last 168 hours.  HbA1C: Hemoglobin A1C  Date/Time Value Ref Range Status  05/16/2022 09:42 AM 10.0 (A) 4.0 - 5.6 % Final   HbA1c, POC (controlled diabetic range)  Date/Time Value Ref Range Status  10/28/2021 12:07 PM 11.4 (A) 0.0 - 7.0 % Final   06/30/2020 04:02 PM 8.8 (A) 0.0 - 7.0 % Final   Hgb A1c MFr Bld  Date/Time Value Ref Range Status  07/10/2022 09:48 AM 11.9 (H) 4.8 - 5.6 % Final    Comment:    (NOTE) Pre diabetes:          5.7%-6.4%  Diabetes:              >6.4%  Glycemic control for   <7.0% adults with diabetes   01/31/2022 10:15 AM 10.7 (H) 4.8 - 5.6 % Final    Comment:             Prediabetes: 5.7 - 6.4          Diabetes: >6.4          Glycemic control for adults with diabetes: <7.0     CBG:  Recent Labs  Lab 08/03/22 0733 08/03/22 1149 08/03/22 1643 08/03/22 2034 08/04/22 0739  GLUCAP 222* 197* 253* 176* 151*    Review of Systems:   Please see the history of present illness. All other systems reviewed and are negative    Past Medical History:  She,  has a past medical history of Cancer (HCC) (03/2017), Diabetes mellitus without complication (HCC), Hyperlipidemia, Hypertension, Malignant neoplasm of left breast (HCC), Stroke (cerebrum) (HCC), and Tibial plateau fracture, left.   Surgical History:   Past Surgical History:  Procedure Laterality Date   MASTECTOMY Left 2019   MASTECTOMY MODIFIED RADICAL Left 11/20/2017   Procedure: LEFT MODIFIED RADICAL MASTECTOMY;  Surgeon: Claud Kelp, MD;  Location: [facility] OR;  Service: General;  Laterality: Left;   ORIF TIBIA PLATEAU Left 04/13/2017   Procedure: OPEN REDUCTION INTERNAL FIXATION (ORIF) TIBIAL PLATEAU;  Surgeon: Roby Lofts, MD;  Location: [facility] OR;  Service: Orthopedics;  Laterality: Left;   PORT-A-CATH REMOVAL Right 11/20/2017   Procedure: REMOVAL PORT-A-CATH;  Surgeon: Claud Kelp, MD;  Location[facility] OR;  Service: General;  Laterality: Right;   PORTACATH PLACEMENT Right 05/31/2017   Procedure: INSERTION PORT-A-CATH;  Surgeon: Claud Kelp, MD;  Location: [facility];  Service: General;  Laterality: Right;     Social History:   reports that she has never smoked. She has never used smokeless tobacco. She reports that she  does not drink alcohol and does not use drugs.   Family History:  Her family history includes Diabetes in her son; Heart Problems in her mother. There is no history of Breast cancer, Colon cancer, Rectal cancer, Stomach cancer, or Esophageal cancer.   Allergies No Known Allergies   Home Medications  Prior to Admission medications   Medication Sig Start Date End Date Taking? Authorizing Provider  acetaminophen (TYLENOL) 325 MG tablet Take 650 mg by mouth every 4 (four) hours as needed for moderate pain.   Yes [provider]  amLODipine (NORVASC) 5 MG tablet Take 1 tablet (5 mg total) by mouth daily. For blood pressure 05/16/22  Yes Claiborne Rigg, NP  atorvastatin (LIPITOR) 40 MG tablet Take 1 tablet (40 mg total) by mouth daily. For cholesterol 01/04/22  Yes Claiborne Rigg, NP  citalopram (CELEXA) 20 MG tablet Take 1 tablet (20 mg total) by mouth daily. For depression 01/04/22  Yes Claiborne Rigg, NP  erythromycin ophthalmic ointment Place 1 Application into the right eye 2 (two) times daily.   Yes [provider]  fluticasone (FLONASE) 50 MCG/ACT nasal spray Place 2 sprays into both nostrils daily. 01/04/22  Yes Claiborne Rigg, NP  oxyCODONE (OXY IR/ROXICODONE) 5 MG immediate release tablet Take 5 mg by mouth every 4 (four) hours as needed for severe pain. Take 0.5 to 1 tablet (2.5-5 mg total) by mouth every 4 (four) hours as needed for up to 5 days   Yes [provider]  Blood Glucose Monitoring Suppl (ONETOUCH VERIO) w/Device KIT Use to check blood sugar three times daily. E11.65 07/08/22   Hoy Register, MD  Blood Pressure Monitor DEVI Please provide patient with insurance approved blood pressure monitor 01/14/19   Claiborne Rigg, NP  carboxymethylcellulose (REFRESH PLUS) 0.5 % SOLN Place 1 drop into the right eye 4 (four) times daily.    [provider]  ceFEPIme 2 g in sodium chloride 0.9 % 100 mL Inject 2 g into the vein every 12 (twelve)  hours. Patient not taking: Reported on 07/29/2022 07/13/22  Leatha Gilding, MD  Continuous Glucose Receiver (FREESTYLE LIBRE READER) DEVI Monitor blood glucose levels 5-6 times per day E11.65 05/16/22   Claiborne Rigg, NP  Continuous Glucose Sensor (FREESTYLE LIBRE 2 SENSOR) MISC Monitor blood glucose levels 5-6 times per day E11.65 05/16/22   Claiborne Rigg, NP  fenofibrate (TRICOR) 145 MG tablet Take 1 tablet (145 mg total) by mouth daily. For cholesterol 01/04/22   Claiborne Rigg, NP  glucose blood test strip Use to check blood sugar three times daily. E11.65 07/08/22   Hoy Register, MD  HUMALOG KWIKPEN 100 UNIT/ML KwikPen Inject 10 Units into the skin 3 (three) times daily. 05/19/22   Hoy Register, MD  Insulin Glargine (BASAGLAR KWIKPEN) 100 UNIT/ML Inject 20 Units into the skin 2 (two) times daily. 05/17/22   Hoy Register, MD  Insulin Pen Needle 31G X 5 MM MISC Inject 1 Device into the skin QID. For use with insulin pens 07/08/22   Hoy Register, MD  Insulin Syringe-Needle U-100 30G X 5/16" 1 ML MISC Use as directed to inject into the skin 3 times daily. 01/04/22   Claiborne Rigg, NP  levofloxacin (LEVAQUIN) 500 MG tablet Take 1 tablet (500 mg total) by mouth daily. Patient not taking: Reported on 07/09/2022 05/26/22   Malachy Mood, MD  losartan (COZAAR) 50 MG tablet Take 50 mg by mouth daily.    [provider]  metroNIDAZOLE (FLAGYL) 500 MG tablet Take 1 tablet (500 mg total) by mouth every 12 (twelve) hours. 07/13/22   Leatha Gilding, MD  ondansetron (ZOFRAN) 8 MG tablet Take 1 tablet (8 mg total) by mouth every 8 (eight) hours as needed for nausea or vomiting. Patient not taking: Reported on 07/09/2022 11/10/21   Malachy Mood, MD  OneTouch Delica Lancets 33G MISC Use to check blood sugar three times daily. E11.65 07/08/22   Hoy Register, MD  pantoprazole (PROTONIX) 40 MG tablet Take 1 tablet (40 mg total) by mouth daily at 6 (six) AM. For heartburn Patient taking  differently: Take 40 mg by mouth daily before breakfast. 05/16/22   Claiborne Rigg, NP  potassium chloride SA (KLOR-CON M) 20 MEQ tablet Take 1 tablet (20 mEq total) by mouth daily. Patient not taking: Reported on 07/29/2022 01/04/22   Claiborne Rigg, NP  prochlorperazine (COMPAZINE) 10 MG tablet Take 1 tablet (10 mg total) by mouth every 6 (six) hours as needed for nausea or vomiting. Patient not taking: Reported on 07/30/2022 11/10/21   Malachy Mood, MD  sitaGLIPtin-metformin (JANUMET) 50-1000 MG tablet Take 1 tablet by mouth 2 (two) times daily with a meal.    [provider]  vancomycin (VANCOREADY) 750 MG/150ML SOLN Inject 150 mLs (750 mg total) into the vein daily. Patient not taking: Reported on 07/29/2022 07/13/22   Leatha Gilding, MD      Cyril Mourning MD. FCCP. [facility] Pager : [phone]  If no response to pager , please call [phone] until 7 pm After 7:00 pm call Elink  [phone]   08/04/2022

## 2022-08-05 ENCOUNTER — Inpatient Hospital Stay: Payer: Medicare Other | Admitting: Nurse Practitioner

## 2022-08-05 ENCOUNTER — Inpatient Hospital Stay (HOSPITAL_COMMUNITY): Payer: Medicare Other

## 2022-08-05 ENCOUNTER — Inpatient Hospital Stay (HOSPITAL_COMMUNITY): Payer: Medicare Other | Admitting: Certified Registered Nurse Anesthetist

## 2022-08-05 ENCOUNTER — Encounter (HOSPITAL_COMMUNITY)
Admission: EM | Disposition: A | Discharge status: Home / Self Care | Payer: Self-pay | Source: Home / Self Care | Attending: Family Medicine

## 2022-08-05 ENCOUNTER — Encounter (HOSPITAL_COMMUNITY): Payer: Self-pay | Admitting: Internal Medicine

## 2022-08-05 DIAGNOSIS — B9620 Unspecified Escherichia coli [E. coli] as the cause of diseases classified elsewhere: Secondary | ICD-10-CM

## 2022-08-05 DIAGNOSIS — I10 Essential (primary) hypertension: Secondary | ICD-10-CM

## 2022-08-05 DIAGNOSIS — E119 Type 2 diabetes mellitus without complications: Secondary | ICD-10-CM

## 2022-08-05 DIAGNOSIS — J9601 Acute respiratory failure with hypoxia: Secondary | ICD-10-CM

## 2022-08-05 DIAGNOSIS — D709 Neutropenia, unspecified: Secondary | ICD-10-CM | POA: Diagnosis not present

## 2022-08-05 DIAGNOSIS — R5081 Fever presenting with conditions classified elsewhere: Secondary | ICD-10-CM | POA: Diagnosis not present

## 2022-08-05 DIAGNOSIS — J189 Pneumonia, unspecified organism: Secondary | ICD-10-CM

## 2022-08-05 HISTORY — PX: VIDEO BRONCHOSCOPY: SHX5072

## 2022-08-05 HISTORY — PX: BRONCHIAL WASHINGS: SHX5105

## 2022-08-05 LAB — COMPREHENSIVE METABOLIC PANEL
ALT: 13 U/L (ref 0–44)
AST: 18 U/L (ref 15–41)
Albumin: 2.3 g/dL — ABNORMAL LOW (ref 3.5–5.0)
Alkaline Phosphatase: 58 U/L (ref 38–126)
Anion gap: 8 (ref 5–15)
BUN: 15 mg/dL (ref 8–23)
CO2: 21 mmol/L — ABNORMAL LOW (ref 22–32)
Calcium: 8 mg/dL — ABNORMAL LOW (ref 8.9–10.3)
Chloride: 103 mmol/L (ref 98–111)
Creatinine, Ser: 0.59 mg/dL (ref 0.44–1.00)
GFR, Estimated: >60 mL/min (ref >60)
Glucose, Bld: 222 mg/dL — ABNORMAL HIGH (ref 70–99)
Potassium: 4.1 mmol/L (ref 3.5–5.1)
Sodium: 132 mmol/L — ABNORMAL LOW (ref 135–145)
Total Bilirubin: 0.7 mg/dL (ref 0.3–1.2)
Total Protein: 7.2 g/dL (ref 6.5–8.1)

## 2022-08-05 LAB — CBC WITH DIFFERENTIAL/PLATELET
Abs Immature Granulocytes: 0.05 10*3/uL (ref 0.00–0.07)
Basophils Absolute: 0 10*3/uL (ref 0.0–0.1)
Basophils Relative: 2 %
Eosinophils Absolute: 0 10*3/uL (ref 0.0–0.5)
Eosinophils Relative: 0 %
HCT: 23.1 % — ABNORMAL LOW (ref 36.0–46.0)
Hemoglobin: 7.8 g/dL — ABNORMAL LOW (ref 12.0–15.0)
Immature Granulocytes: 5 %
Lymphocytes Relative: 56 %
Lymphs Abs: 0.5 10*3/uL — ABNORMAL LOW (ref 0.7–4.0)
MCH: 28.7 pg (ref 26.0–34.0)
MCHC: 33.8 g/dL (ref 30.0–36.0)
MCV: 84.9 fL (ref 80.0–100.0)
Monocytes Absolute: 0.3 10*3/uL (ref 0.1–1.0)
Monocytes Relative: 27 %
Neutro Abs: 0.1 10*3/uL — CL (ref 1.7–7.7)
Neutrophils Relative %: 10 %
Platelets: 26 10*3/uL — CL (ref 150–400)
RBC: 2.72 MIL/uL — ABNORMAL LOW (ref 3.87–5.11)
RDW: 13.9 % (ref 11.5–15.5)
WBC: 1 10*3/uL — CL (ref 4.0–10.5)
nRBC: 2.1 % — ABNORMAL HIGH (ref 0.0–0.2)

## 2022-08-05 LAB — GLUCOSE, CAPILLARY
Glucose-Capillary: 139 mg/dL — ABNORMAL HIGH (ref 70–99)
Glucose-Capillary: 101 mg/dL — ABNORMAL HIGH (ref 70–99)
Glucose-Capillary: 132 mg/dL — ABNORMAL HIGH (ref 70–99)
Glucose-Capillary: 163 mg/dL — ABNORMAL HIGH (ref 70–99)
Glucose-Capillary: 256 mg/dL — ABNORMAL HIGH (ref 70–99)

## 2022-08-05 LAB — CBC
HCT: 20.8 % — ABNORMAL LOW (ref 36.0–46.0)
Hemoglobin: 7.2 g/dL — ABNORMAL LOW (ref 12.0–15.0)
MCH: 29.9 pg (ref 26.0–34.0)
MCHC: 34.6 g/dL (ref 30.0–36.0)
MCV: 86.3 fL (ref 80.0–100.0)
Platelets: 41 10*3/uL — ABNORMAL LOW (ref 150–400)
RBC: 2.41 MIL/uL — ABNORMAL LOW (ref 3.87–5.11)
RDW: 14.2 % (ref 11.5–15.5)
WBC: 0.8 10*3/uL — CL (ref 4.0–10.5)
nRBC: 0 % (ref 0.0–0.2)

## 2022-08-05 LAB — PREPARE PLATELET PHERESIS
Unit division: 0
Unit division: 0
Unit division: 0
Unit division: 0

## 2022-08-05 LAB — BPAM PLATELET PHERESIS
Blood Product Expiration Date: 202407122359
Blood Product Expiration Date: 202407122359
Blood Product Expiration Date: 202407152359
Blood Product Expiration Date: 202407152359
Blood Product Expiration Date: 202407152359
ISSUE DATE / TIME: 202407111348
ISSUE DATE / TIME: 202407112124
Unit Type and Rh: 5100
Unit Type and Rh: 7300
Unit Type and Rh: 8400

## 2022-08-05 LAB — VITAMIN B12: Vitamin B-12: 1048 pg/mL — ABNORMAL HIGH (ref 180–914)

## 2022-08-05 LAB — FUNGITELL BETA-D-GLUCAN
Fungitell Value:: <31.25 pg/mL
Result Name:: NEGATIVE

## 2022-08-05 LAB — LEGIONELLA PNEUMOPHILA SEROGP 1 UR AG: L. pneumophila Serogp 1 Ur Ag: NEGATIVE

## 2022-08-05 LAB — OSMOLALITY: Osmolality: 286 mOsm/kg (ref 275–295)

## 2022-08-05 LAB — AMMONIA: Ammonia: 41 umol/L — ABNORMAL HIGH (ref 9–35)

## 2022-08-05 LAB — BODY FLUID CELL COUNT WITH DIFFERENTIAL
Eos, Fluid: 0 %
Lymphs, Fluid: 34 %
Monocyte-Macrophage-Serous Fluid: 64 % (ref 50–90)
Neutrophil Count, Fluid: 2 % (ref 0–25)
Total Nucleated Cell Count, Fluid: 70 cu mm (ref 0–1000)

## 2022-08-05 LAB — BRAIN NATRIURETIC PEPTIDE: B Natriuretic Peptide: 274.2 pg/mL — ABNORMAL HIGH (ref 0.0–100.0)

## 2022-08-05 LAB — LACTIC ACID, PLASMA
Lactic Acid, Venous: 3 mmol/L (ref 0.5–1.9)
Lactic Acid, Venous: 2.4 mmol/L (ref 0.5–1.9)

## 2022-08-05 LAB — PREPARE RBC (CROSSMATCH)

## 2022-08-05 LAB — FOLATE: Folate: 12.4 ng/mL (ref >5.9)

## 2022-08-05 LAB — MAGNESIUM: Magnesium: 2.3 mg/dL (ref 1.7–2.4)

## 2022-08-05 LAB — HISTOPLASMA ANTIGEN, URINE: Histoplasma Antigen, urine: NEGATIVE (ref <0.2)

## 2022-08-05 LAB — TSH: TSH: 1.756 u[IU]/mL (ref 0.350–4.500)

## 2022-08-05 LAB — PHOSPHORUS: Phosphorus: 2.2 mg/dL — ABNORMAL LOW (ref 2.5–4.6)

## 2022-08-05 LAB — MRSA NEXT GEN BY PCR, NASAL: MRSA by PCR Next Gen: NOT DETECTED

## 2022-08-05 SURGERY — VIDEO BRONCHOSCOPY WITHOUT FLUORO
Anesthesia: General

## 2022-08-05 MED ORDER — SODIUM CHLORIDE 0.9% IV SOLUTION: Freq: Once | INTRAVENOUS | Status: DC

## 2022-08-05 MED ORDER — ALBUTEROL SULFATE (2.5 MG/3ML) 0.083% IN NEBU
2.5000 mg | INHALATION_SOLUTION | Freq: Once | RESPIRATORY_TRACT | Status: AC
  Administered 2022-08-05: 2.5 mg via RESPIRATORY_TRACT

## 2022-08-05 MED ORDER — ALBUTEROL SULFATE (2.5 MG/3ML) 0.083% IN NEBU
INHALATION_SOLUTION | RESPIRATORY_TRACT | Status: AC
  Filled 2022-08-05: qty 3

## 2022-08-05 MED ORDER — ORAL CARE MOUTH RINSE: 15.0000 mL | OROMUCOSAL | Status: DC | PRN

## 2022-08-05 MED ORDER — OXYCODONE HCL 5 MG/5ML PO SOLN: 5.0000 mg | Freq: Once | ORAL | Status: DC | PRN

## 2022-08-05 MED ORDER — CHLORHEXIDINE GLUCONATE 0.12 % MT SOLN
15.0000 mL | Freq: Once | OROMUCOSAL | Status: AC
  Administered 2022-08-05: 15 mL via OROMUCOSAL

## 2022-08-05 MED ORDER — CHLORHEXIDINE GLUCONATE CLOTH 2 % EX PADS
6.0000 | MEDICATED_PAD | Freq: Every day | CUTANEOUS | Status: DC
  Administered 2022-08-05 – 2022-08-14 (×9): 6 via TOPICAL

## 2022-08-05 MED ORDER — MIDAZOLAM HCL 2 MG/2ML IJ SOLN
INTRAMUSCULAR | Status: AC
  Filled 2022-08-05: qty 2

## 2022-08-05 MED ORDER — LACTATED RINGERS IV BOLUS
500.0000 mL | Freq: Once | INTRAVENOUS | Status: AC
  Administered 2022-08-05: 500 mL via INTRAVENOUS

## 2022-08-05 MED ORDER — ROCURONIUM BROMIDE 10 MG/ML (PF) SYRINGE
PREFILLED_SYRINGE | INTRAVENOUS | Status: DC | PRN
  Administered 2022-08-05: 40 mg via INTRAVENOUS

## 2022-08-05 MED ORDER — SODIUM CHLORIDE 0.9 % IV BOLUS: 250.0000 mL | INTRAVENOUS | Status: DC | PRN

## 2022-08-05 MED ORDER — LACTULOSE 10 GM/15ML PO SOLN
20.0000 g | Freq: Every day | ORAL | Status: DC
  Administered 2022-08-05: 20 g via ORAL
  Filled 2022-08-05: qty 30

## 2022-08-05 MED ORDER — ORAL CARE MOUTH RINSE
15.0000 mL | OROMUCOSAL | Status: DC
  Administered 2022-08-05 – 2022-08-08 (×9): 15 mL via OROMUCOSAL

## 2022-08-05 MED ORDER — LACTATED RINGERS IV SOLN: INTRAVENOUS | Status: DC

## 2022-08-05 MED ORDER — SODIUM CHLORIDE 0.9 % IV BOLUS
1000.0000 mL | Freq: Once | INTRAVENOUS | Status: AC
  Administered 2022-08-05: 1000 mL via INTRAVENOUS

## 2022-08-05 MED ORDER — FENTANYL CITRATE (PF) 100 MCG/2ML IJ SOLN
INTRAMUSCULAR | Status: AC
  Filled 2022-08-05: qty 2

## 2022-08-05 MED ORDER — OXYCODONE HCL 5 MG PO TABS: 5.0000 mg | ORAL_TABLET | Freq: Once | ORAL | Status: DC | PRN

## 2022-08-05 MED ORDER — DEXAMETHASONE SODIUM PHOSPHATE 10 MG/ML IJ SOLN
INTRAMUSCULAR | Status: DC | PRN
  Administered 2022-08-05: 10 mg via INTRAVENOUS

## 2022-08-05 MED ORDER — PHENYLEPHRINE 80 MCG/ML (10ML) SYRINGE FOR IV PUSH (FOR BLOOD PRESSURE SUPPORT)
PREFILLED_SYRINGE | INTRAVENOUS | Status: DC | PRN
  Administered 2022-08-05 (×2): 80 ug via INTRAVENOUS

## 2022-08-05 MED ORDER — LACTATED RINGERS IV BOLUS: 500.0000 mL | Freq: Once | INTRAVENOUS | Status: DC

## 2022-08-05 MED ORDER — CHLORHEXIDINE GLUCONATE 0.12 % MT SOLN
OROMUCOSAL | Status: AC
  Filled 2022-08-05: qty 15

## 2022-08-05 MED ORDER — SUGAMMADEX SODIUM 200 MG/2ML IV SOLN
INTRAVENOUS | Status: DC | PRN
  Administered 2022-08-05: 200 mg via INTRAVENOUS

## 2022-08-05 MED ORDER — MIDAZOLAM HCL 2 MG/2ML IJ SOLN
INTRAMUSCULAR | Status: DC | PRN
  Administered 2022-08-05: 1 mg via INTRAVENOUS

## 2022-08-05 MED ORDER — ONDANSETRON HCL 4 MG/2ML IJ SOLN
4.0000 mg | Freq: Four times a day (QID) | INTRAMUSCULAR | Status: AC | PRN
  Administered 2022-08-05: 4 mg via INTRAVENOUS

## 2022-08-05 MED ORDER — PROPOFOL 10 MG/ML IV BOLUS
INTRAVENOUS | Status: DC | PRN
Start: 2022-08-05 — End: 2022-08-05
  Administered 2022-08-05: 130 mg via INTRAVENOUS

## 2022-08-05 MED ORDER — FENTANYL CITRATE (PF) 100 MCG/2ML IJ SOLN
25.0000 ug | INTRAMUSCULAR | Status: DC | PRN
  Administered 2022-08-05 – 2022-08-13 (×2): 50 ug via INTRAVENOUS
  Filled 2022-08-05: qty 2

## 2022-08-05 MED ORDER — LIDOCAINE 2% (20 MG/ML) 5 ML SYRINGE
INTRAMUSCULAR | Status: DC | PRN
  Administered 2022-08-05: 80 mg via INTRAVENOUS

## 2022-08-05 MED ORDER — PROPOFOL 10 MG/ML IV BOLUS
INTRAVENOUS | Status: AC
  Filled 2022-08-05: qty 20

## 2022-08-05 MED ORDER — PROPOFOL 1000 MG/100ML IV EMUL
INTRAVENOUS | Status: AC
  Filled 2022-08-05: qty 100

## 2022-08-05 MED ORDER — LACTULOSE 10 GM/15ML PO SOLN: 20.0000 g | Freq: Every day | ORAL | Status: DC

## 2022-08-05 NOTE — Anesthesia Preprocedure Evaluation (Signed)
Anesthesia Evaluation  Patient identified by MRN, date of birth, ID band Patient awake    Reviewed: Allergy & Precautions, H&P , NPO status , Patient's Chart, lab work & pertinent test results  Airway Mallampati: II   Neck ROM: full    Dental   Pulmonary pneumonia   breath sounds clear to auscultation       Cardiovascular hypertension,  Rhythm:regular Rate:Normal     Neuro/Psych  PSYCHIATRIC DISORDERS Anxiety Depression    CVA    GI/Hepatic ,GERD  ,,  Endo/Other  diabetes, Type 2    Renal/GU      Musculoskeletal   Abdominal   Peds  Hematology   Anesthesia Other Findings   Reproductive/Obstetrics H/o left breast CA                             Anesthesia Physical Anesthesia Plan  ASA: 3  Anesthesia Plan: General   Post-op Pain Management:    Induction: Intravenous  PONV Risk Score and Plan: 3 and Ondansetron, Dexamethasone and Treatment may vary due to age or medical condition  Airway Management Planned: Oral ETT  Additional Equipment:   Intra-op Plan:   Post-operative Plan: Extubation in OR  Informed Consent: I have reviewed the patients History and Physical, chart, labs and discussed the procedure including the risks, benefits and alternatives for the proposed anesthesia with the patient or authorized representative who has indicated his/her understanding and acceptance.     Dental advisory given  Plan Discussed with: CRNA, Anesthesiologist and Surgeon  Anesthesia Plan Comments:        Anesthesia Quick Evaluation

## 2022-08-05 NOTE — H&P (View-Only) (Signed)
NAME:  Amanda Duarte, MRN:  [id number], DOB:  06-Jan-1956, LOS: 7 ADMISSION DATE:  07/29/2022, CONSULTATION DATE:  08/04/2022 REFERRING MD:  Dr. Lowell Guitar - [facility], CHIEF COMPLAINT: Multifocal pneumonia  History of Present Illness:  Amanda Duarte is a 67 year old female with past medical history significant for left breast cancer, type 2 diabetes, myelodysplastic syndrome HTN, HLD, and prior stroke who initially presented to the emergency department from oncology office in 7/5 with fever.  On presentation to oncology office patient was seen to have a temperature of 100.4 with thrombocytopenia.  He is admitted per hospitalist service for management of possible underlying infection, started empirically on IV vancomycin and cefepime.  Mid morning of 7/11 PCCM consulted for worsening fever with Tmax 103.2 in the setting of multifocal pneumonia and E. coli UTI.  PCCM consulted for assistance in management.  Pertinent  Medical History   left breast cancer, type 2 diabetes, myelodysplastic syndrome HTN, HLD, and prior stroke  Significant Hospital Events: Including procedures, antibiotic start and stop dates in addition to other pertinent events   7/05 during outpatient oncology follow-up patient found to be febrile and thrombocytopenic started on empiric antibiotics UA negative for bacteria, positive protein, negative nitrates and leukocytes.  Urine culture positive for 10,000 colonies of pansensitive E. coli 7/9 ID consulted and recommended advanced imaging CT chest/abdomen/pelvis with signs of multifocal pneumonia 7/11 or worsening fever with Tmax 103.2, PCCM consulted for assistance in bronchoscopy  Interim History / Subjective:   Febrile 103 overnight Mild tachycardia On 2 L nasal cannula  Objective   Blood pressure (!) 111/55, pulse (!) 101, temperature 98.8 F (37.1 C), temperature source Oral, resp. rate (!) 28, height 5' (1.524 m), weight 52.1 kg, SpO2 95%.        Intake/Output Summary (Last 24  hours) at 08/05/2022 5573 Last data filed at 08/05/2022 0800 Gross per 24 hour  Intake 4501.73 ml  Output --  Net 4501.73 ml   Filed Weights   07/29/22 1747 07/29/22 1748  Weight: 55 kg 52.1 kg    Examination: General: Elderly woman lying in bed, no distress on nasal cannula HENT: Pallor, no icterus, no JVD Lungs: Bilateral scattered crackles, no accessory muscle use Cardiovascular: S1-S2 reg Abdomen: Soft, nontender, no organomegaly Extremities: No deformity, no edema Neuro: Sleepy but alert, interactive, nonfocal when aroused   Labs show  hyponatremia, phosphate improved to 2.2, mag normal  neutropenia with ANC 100, platelets improved to 26 K  Resolved Hospital Problem list     Assessment & Plan:  Multifocal pneumonia Neutropenic fever Recently treated for periorbital cellulitis She had previous bronchoscopy 11/2021 when she required mechanical ventilation, Aspergillus antigen was negative then P: Proceed with bronchoscopy today, obtain BAL left lower lobe ,risks and benefits discussed N.p.o. at midnight Antibiotics per ID, previously she had prolonged QT but empiric voriconazole was used  Pancytopenia -Transfuse 1 bag of platelets yesterday and this morning, transfuse second bag on call to procedure  I have discussed risks and benefits of bronchoscopy procedure especially in the setting of severe thrombocytopenia.  I also coordinated with hematology regarding platelet transfusions prior to procedure.  I discussed her situation in detail with infectious disease consultant    Best Practice (right click and "Reselect all SmartList Selections" daily)  Per primary  Labs   CBC: Recent Labs  Lab 07/30/22 1010 07/31/22 0332 08/01/22 0322 08/02/22 1618 08/03/22 0711 08/04/22 0846 08/05/22 0406  WBC 1.1*   < > 1.1* 1.0* 0.7* 0.8* 1.0*  NEUTROABS 0.1*  --   --  0.2* 0.2* 0.1* 0.1*  HGB 8.9*   < > 8.5* 7.2* 8.2* 7.2* 7.8*  HCT 26.6*   < > 25.3* 21.4* 24.4* 22.3*  23.1*  MCV 86.1   < > 84.3 84.6 83.0 85.8 84.9  PLT 19*   < > 8* 17* 17* 9* 26*   < > = values in this interval not displayed.    Basic Metabolic Panel: Recent Labs  Lab 07/29/22 1438 07/30/22 0412 07/31/22 0332 08/01/22 0322 08/03/22 0711 08/04/22 0846 08/05/22 0406  NA 134*   < > 130* 131* 129* 128* 132*  K 3.2*   < > 3.8 3.9 3.4* 3.2* 4.1  CL 103   < > 102 99 96* 99 103  CO2 24   < > 20* 22 19* 20* 21*  GLUCOSE 205*   < > 123* 241* 212* 132* 222*  BUN 17   < > 13 15 17 21 15   CREATININE 0.58   < > 0.54 0.57 0.74 0.62 0.59  CALCIUM 8.2*   < > 8.3* 8.6* 8.7* 8.1* 8.0*  MG 1.5*  --   --   --   --  1.4* 2.3  PHOS  --   --   --   --   --  1.8* 2.2*   < > = values in this interval not displayed.   GFR: Estimated Creatinine Clearance: 49 mL/min (by C-G formula based on SCr of 0.59 mg/dL). Recent Labs  Lab 08/02/22 1618 08/03/22 0711 08/04/22 0534 08/04/22 0846 08/04/22 2229 08/05/22 0406 08/05/22 0523  WBC 1.0* 0.7*  --  0.8*  --  1.0*  --   LATICACIDVEN  --   --  1.7  --  3.7* 3.0* 2.4*    Liver Function Tests: Recent Labs  Lab 07/29/22 1438 07/31/22 0332 08/01/22 0322 08/04/22 0846 08/05/22 0406  AST 13* 17 20 18 18   ALT 12 13 13 14 13   ALKPHOS 61 65 72 55 58  BILITOT 0.5 0.6 0.4 0.9 0.7  PROT 7.8 8.2* 8.1 7.3 7.2  ALBUMIN 3.0* 3.2* 3.0* 2.5* 2.3*   Recent Labs  Lab 07/29/22 1438  LIPASE 29   Recent Labs  Lab 08/04/22 1309 08/05/22 0406  AMMONIA 57* 41*    ABG    Component Value Date/Time   PHART 7.39 12/07/2021 1130   PCO2ART 36 12/07/2021 1130   PO2ART 101 12/07/2021 1130   HCO3 22.3 08/04/2022 1309   TCO2 30 05/31/2017 1113   ACIDBASEDEF 0.4 08/04/2022 1309   O2SAT 93.4 08/04/2022 1309     Coagulation Profile: No results for input(s): "INR", "PROTIME" in the last 168 hours.  Cardiac Enzymes: No results for input(s): "CKTOTAL", "CKMB", "CKMBINDEX", "TROPONINI" in the last 168 hours.  HbA1C: Hemoglobin A1C  Date/Time Value Ref  Range Status  05/16/2022 09:42 AM 10.0 (A) 4.0 - 5.6 % Final   HbA1c, POC (controlled diabetic range)  Date/Time Value Ref Range Status  10/28/2021 12:07 PM 11.4 (A) 0.0 - 7.0 % Final  06/30/2020 04:02 PM 8.8 (A) 0.0 - 7.0 % Final   Hgb A1c MFr Bld  Date/Time Value Ref Range Status  07/10/2022 09:48 AM 11.9 (H) 4.8 - 5.6 % Final    Comment:    (NOTE) Pre diabetes:          5.7%-6.4%  Diabetes:              >6.4%  Glycemic control for   <7.0% adults  with diabetes   01/31/2022 10:15 AM 10.7 (H) 4.8 - 5.6 % Final    Comment:             Prediabetes: 5.7 - 6.4          Diabetes: >6.4          Glycemic control for adults with diabetes: <7.0     CBG: Recent Labs  Lab 08/04/22 0739 08/04/22 1214 08/04/22 1633 08/04/22 2049 08/05/22 0733  GLUCAP 151* 128* 90 270* 139*     Cyril Mourning MD. FCCP. [facility] Pager : [phone]  If no response to pager , please call [phone] until 7 pm After 7:00 pm call Elink  [phone]   08/05/2022

## 2022-08-05 NOTE — Progress Notes (Signed)
Pharmacy Antibiotic Note  Amanda Duarte is a 67 y.o. female admitted on 07/29/2022 with fever. Patient with sore throat. 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 Recent admission for orbital cellulitis.  ID consulted and antibiotics broadened to cover neutropenic fever.  Today, Pharmacy remains consulted for vancomycin dosing for febrile neutropenia.  Today, 08/05/2022: Continues to spike fevers, Tmax 103.2 Creatinine stable  Plan: Continue Vancomycin 750mg  IV q24h (Goal AUC 400-550, Expected AUC: 443.5, SCr used: 0.8 (rounded up from 0.59)  Meropenem and Micafungin per MD Monitor clinical progress, renal function, vancomycin levels as indicated F/U C&S, abx deescalation / LOT  Height: 5' (152.4 cm) Weight: 52.1 kg (114 lb 12.8 oz) IBW/kg (Calculated) : 45.5  Temp (24hrs), Avg:100.7 F (38.2 C), Min:98.4 F (36.9 C), Max:103.2 F (39.6 C)  Recent Labs  Lab 07/31/22 0332 08/01/22 0322 08/02/22 1618 08/03/22 0711 08/04/22 0534 08/04/22 0846 08/04/22 2229 08/05/22 0406 08/05/22 0523  WBC 0.7* 1.1* 1.0* 0.7*  --  0.8*  --  1.0*  --   CREATININE 0.54 0.57  --  0.74  --  0.62  --  0.59  --   LATICACIDVEN  --   --   --   --  1.7  --  3.7* 3.0* 2.4*    Estimated Creatinine Clearance: 49 mL/min (by C-G formula based on SCr of 0.59 mg/dL).    No Known Allergies  Antimicrobials this admission: Vancomycin 7/5 >> 7/7; restart 7/9 >> Cefepime 7/5 >> 7/8; restart 7/9 >> 7/11 Ceftriaxone 7/9 x 1 dose Metronidazole 7/9 >> 7/11 Micafungin 7/9 >> Meropenem 7/11 >>    Microbiology results:  7/5 BCx (at [facility]): ngtd 7/11 Bcx: sent 7/5 UCx (at [facility]): 10K E.coli  7/5: Resp panel: neg 7/5 Group A Strep: neg  Thank you for allowing pharmacy to be a part of this patient's care.  Selinda Eon, PharmD, BCPS Clinical Pharmacist [place] 08/05/2022 7:30 AM

## 2022-08-05 NOTE — Interval H&P Note (Signed)
History and Physical Interval Note:  08/05/2022 9:53 AM  Amanda Duarte  has presented today for surgery, with the diagnosis of pneumonia/BAL.  The various methods of treatment have been discussed with the patient and family. After consideration of risks, benefits and other options for treatment, the patient has consented to  Procedure(s): VIDEO BRONCHOSCOPY WITHOUT FLUORO (N/A) as a surgical intervention.  The patient's history has been reviewed, patient examined, no change in status, stable for surgery.  I have reviewed the patient's chart and labs.  Questions were answered to the patient's satisfaction.     Comer Locket Vassie Loll

## 2022-08-05 NOTE — Progress Notes (Signed)
NAME:  Amanda Duarte, MRN:  [id number], DOB:  06-Jan-1956, LOS: 7 ADMISSION DATE:  07/29/2022, CONSULTATION DATE:  08/04/2022 REFERRING MD:  Dr. Lowell Guitar - [facility], CHIEF COMPLAINT: Multifocal pneumonia  History of Present Illness:  Amanda Duarte is a 67 year old female with past medical history significant for left breast cancer, type 2 diabetes, myelodysplastic syndrome HTN, HLD, and prior stroke who initially presented to the emergency department from oncology office in 7/5 with fever.  On presentation to oncology office patient was seen to have a temperature of 100.4 with thrombocytopenia.  He is admitted per hospitalist service for management of possible underlying infection, started empirically on IV vancomycin and cefepime.  Mid morning of 7/11 PCCM consulted for worsening fever with Tmax 103.2 in the setting of multifocal pneumonia and E. coli UTI.  PCCM consulted for assistance in management.  Pertinent  Medical History   left breast cancer, type 2 diabetes, myelodysplastic syndrome HTN, HLD, and prior stroke  Significant Hospital Events: Including procedures, antibiotic start and stop dates in addition to other pertinent events   7/05 during outpatient oncology follow-up patient found to be febrile and thrombocytopenic started on empiric antibiotics UA negative for bacteria, positive protein, negative nitrates and leukocytes.  Urine culture positive for 10,000 colonies of pansensitive E. coli 7/9 ID consulted and recommended advanced imaging CT chest/abdomen/pelvis with signs of multifocal pneumonia 7/11 or worsening fever with Tmax 103.2, PCCM consulted for assistance in bronchoscopy  Interim History / Subjective:   Febrile 103 overnight Mild tachycardia On 2 L nasal cannula  Objective   Blood pressure (!) 111/55, pulse (!) 101, temperature 98.8 F (37.1 C), temperature source Oral, resp. rate (!) 28, height 5' (1.524 m), weight 52.1 kg, SpO2 95%.        Intake/Output Summary (Last 24  hours) at 08/05/2022 5573 Last data filed at 08/05/2022 0800 Gross per 24 hour  Intake 4501.73 ml  Output --  Net 4501.73 ml   Filed Weights   07/29/22 1747 07/29/22 1748  Weight: 55 kg 52.1 kg    Examination: General: Elderly woman lying in bed, no distress on nasal cannula HENT: Pallor, no icterus, no JVD Lungs: Bilateral scattered crackles, no accessory muscle use Cardiovascular: S1-S2 reg Abdomen: Soft, nontender, no organomegaly Extremities: No deformity, no edema Neuro: Sleepy but alert, interactive, nonfocal when aroused   Labs show  hyponatremia, phosphate improved to 2.2, mag normal  neutropenia with ANC 100, platelets improved to 26 K  Resolved Hospital Problem list     Assessment & Plan:  Multifocal pneumonia Neutropenic fever Recently treated for periorbital cellulitis She had previous bronchoscopy 11/2021 when she required mechanical ventilation, Aspergillus antigen was negative then P: Proceed with bronchoscopy today, obtain BAL left lower lobe ,risks and benefits discussed N.p.o. at midnight Antibiotics per ID, previously she had prolonged QT but empiric voriconazole was used  Pancytopenia -Transfuse 1 bag of platelets yesterday and this morning, transfuse second bag on call to procedure  I have discussed risks and benefits of bronchoscopy procedure especially in the setting of severe thrombocytopenia.  I also coordinated with hematology regarding platelet transfusions prior to procedure.  I discussed her situation in detail with infectious disease consultant    Best Practice (right click and "Reselect all SmartList Selections" daily)  Per primary  Labs   CBC: Recent Labs  Lab 07/30/22 1010 07/31/22 0332 08/01/22 0322 08/02/22 1618 08/03/22 0711 08/04/22 0846 08/05/22 0406  WBC 1.1*   < > 1.1* 1.0* 0.7* 0.8* 1.0*  NEUTROABS 0.1*  --   --  0.2* 0.2* 0.1* 0.1*  HGB 8.9*   < > 8.5* 7.2* 8.2* 7.2* 7.8*  HCT 26.6*   < > 25.3* 21.4* 24.4* 22.3*  23.1*  MCV 86.1   < > 84.3 84.6 83.0 85.8 84.9  PLT 19*   < > 8* 17* 17* 9* 26*   < > = values in this interval not displayed.    Basic Metabolic Panel: Recent Labs  Lab 07/29/22 1438 07/30/22 0412 07/31/22 0332 08/01/22 0322 08/03/22 0711 08/04/22 0846 08/05/22 0406  NA 134*   < > 130* 131* 129* 128* 132*  K 3.2*   < > 3.8 3.9 3.4* 3.2* 4.1  CL 103   < > 102 99 96* 99 103  CO2 24   < > 20* 22 19* 20* 21*  GLUCOSE 205*   < > 123* 241* 212* 132* 222*  BUN 17   < > 13 15 17 21 15   CREATININE 0.58   < > 0.54 0.57 0.74 0.62 0.59  CALCIUM 8.2*   < > 8.3* 8.6* 8.7* 8.1* 8.0*  MG 1.5*  --   --   --   --  1.4* 2.3  PHOS  --   --   --   --   --  1.8* 2.2*   < > = values in this interval not displayed.   GFR: Estimated Creatinine Clearance: 49 mL/min (by C-G formula based on SCr of 0.59 mg/dL). Recent Labs  Lab 08/02/22 1618 08/03/22 0711 08/04/22 0534 08/04/22 0846 08/04/22 2229 08/05/22 0406 08/05/22 0523  WBC 1.0* 0.7*  --  0.8*  --  1.0*  --   LATICACIDVEN  --   --  1.7  --  3.7* 3.0* 2.4*    Liver Function Tests: Recent Labs  Lab 07/29/22 1438 07/31/22 0332 08/01/22 0322 08/04/22 0846 08/05/22 0406  AST 13* 17 20 18 18   ALT 12 13 13 14 13   ALKPHOS 61 65 72 55 58  BILITOT 0.5 0.6 0.4 0.9 0.7  PROT 7.8 8.2* 8.1 7.3 7.2  ALBUMIN 3.0* 3.2* 3.0* 2.5* 2.3*   Recent Labs  Lab 07/29/22 1438  LIPASE 29   Recent Labs  Lab 08/04/22 1309 08/05/22 0406  AMMONIA 57* 41*    ABG    Component Value Date/Time   PHART 7.39 12/07/2021 1130   PCO2ART 36 12/07/2021 1130   PO2ART 101 12/07/2021 1130   HCO3 22.3 08/04/2022 1309   TCO2 30 05/31/2017 1113   ACIDBASEDEF 0.4 08/04/2022 1309   O2SAT 93.4 08/04/2022 1309     Coagulation Profile: No results for input(s): "INR", "PROTIME" in the last 168 hours.  Cardiac Enzymes: No results for input(s): "CKTOTAL", "CKMB", "CKMBINDEX", "TROPONINI" in the last 168 hours.  HbA1C: Hemoglobin A1C  Date/Time Value Ref  Range Status  05/16/2022 09:42 AM 10.0 (A) 4.0 - 5.6 % Final   HbA1c, POC (controlled diabetic range)  Date/Time Value Ref Range Status  10/28/2021 12:07 PM 11.4 (A) 0.0 - 7.0 % Final  06/30/2020 04:02 PM 8.8 (A) 0.0 - 7.0 % Final   Hgb A1c MFr Bld  Date/Time Value Ref Range Status  07/10/2022 09:48 AM 11.9 (H) 4.8 - 5.6 % Final    Comment:    (NOTE) Pre diabetes:          5.7%-6.4%  Diabetes:              >6.4%  Glycemic control for   <7.0% adults  with diabetes   01/31/2022 10:15 AM 10.7 (H) 4.8 - 5.6 % Final    Comment:             Prediabetes: 5.7 - 6.4          Diabetes: >6.4          Glycemic control for adults with diabetes: <7.0     CBG: Recent Labs  Lab 08/04/22 0739 08/04/22 1214 08/04/22 1633 08/04/22 2049 08/05/22 0733  GLUCAP 151* 128* 90 270* 139*     Cyril Mourning MD. FCCP. [facility] Pager : [phone]  If no response to pager , please call [phone] until 7 pm After 7:00 pm call Elink  [phone]   08/05/2022

## 2022-08-05 NOTE — Progress Notes (Addendum)
PROGRESS NOTE    Amanda Duarte  WUJ:[id number] DOB: 04-13-1955 DOA: 07/29/2022 PCP: Claiborne Rigg, NP  Chief Complaint  Patient presents with   Fever    Brief Narrative:   67 y.o. female with medical history significant for treated breast cancer, insulin-dependent type 2 diabetes, myelodysplastic syndrome being admitted to the hospital with neutropenic fever.  She presented to oncology clinic with Dr. Mosetta Putt for routine follow-up, complaint of sore fever but no other symptoms, is currently on azacitadine injection. Today in the oncology clinic she was found to have fever 100.4. Lab work was obtained, showed platelets 14,000 was given 1 unit platelet transfusion in the oncology office and then sent to [facility] ED for evaluation  Chest x-ray with mild peribronchial thickening, UA without evidence of infection. Blood and urine cultures were obtained, she was started empirically on IV vancomycin and IV cefepime.   Assessment & Plan:   Principal Problem:   Neutropenic fever (HCC) Active Problems:   Multifocal pneumonia  Hypotension Transferring to stepdown, will monitor closely  May need more IVF +/- pRBC (pressors if not responding)  Neutropenic fever  Sepsis Due to Multifocal Pneumonia  E. Coli UTI -continued persistent fevers - will transfer to stepdown for closer monitoring -Recently treated for facial cellulitis - CT orbits was again obtained which shows resolution of periorbital cellulitis -CT chest abdomen pelvis with multifocal airspace disease with tree in bud and nodular opacities throughout all lobes of both lungs - multifocal pneumonia -Urine culture grew E. coli, pansensitive -ANC 0.1 -appreciate ID assistance - meropenem, vanc, micafungin - will consult ENT with regards to sinusitis (Dr. Pollyann Kennedy recommended waiting for bronch results and can consider further workup if unrevealing from bronch standpoint) -appreciate pulmonary assistance - s/p bronch 7/12 at 10 AM -> copious light  green secretions noted especially in LLL bronchus, no endobronchial lesions - specimen sent for BAL, cell count, cytology, culture and gram stain, AFB, fungal culture and stain.  Pneumocystis smear, aspergillus Ag. - negative strep pneumonia ag, urine legionella negative, histoplasma antigen pending, negative crypto ag, fungitell pending, blastomyces Ag. - blood cultures NGTD (repeat pending 7/11 am) - appreciate oncology assistance  Acute Hypoxic Respiratory Failure - due to multifocal pneumonia, follow with treatment  Lactic Acidosis - improved after IVF, will monitor - related to infection above  Lethargy  Acute Metabolic Encephalopathy  Elevated Ammonia Level - due to suspected infectious process above, also just had procedure today - TSH, b12, folate wnl, VBG without hypercarbia - ammonia is elevated, unclear significance in absence of cirrhosis - repeat mildly elevated, will start daily lactulose  Mild dysplastic syndrome, high-grade Pancytopenia  Severe Thrombocytopenia  Anemia  Neutropenia as Above -Diagnosed in 10/23 -Pulmonary biopsy showed high-grade myeloid neoplasm -Started on azacitidine injection -She was seen at [facility] for bone marrow transplant -Repeat bone marrow biopsy in April 2024 showed persistent high-grade MDS, 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 Sharyne Richters pending per oncology -s/p 5 units of platelets and 1 unit pRBC -Oncology following - transfse for Hb <7.5, platelets < 15,000   UTI -Urine culture grew E. 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, triple negative -Diagnosed in 4/19 -S/p left mastectomy, neoadjuvant chemotherapy and radiation treatment -Had  excellent response -Recent mammogram on 7/23 and right axilla ultrasound on 10/23 were benign -She is on surveillance now, followed by oncology as outpatient    DVT prophylaxis: SCD Code Status: full Family Communication: called daughter, no answer today Disposition:   Status is: Inpatient Remains inpatient appropriate because: need for continued inpatient care   Consultants:  ID oncology  Procedures:  none  Antimicrobials:  Anti-infectives (From admission, onward)    Start     Dose/Rate Route Frequency Ordered Stop   08/04/22 1400  meropenem (MERREM) 1 g in sodium chloride 0.9 % 100 mL IVPB        1 g 200 mL/hr over 30 Minutes Intravenous Every 8 hours 08/04/22 0915     08/02/22 1700  metroNIDAZOLE (FLAGYL) tablet 500 mg  Status:  Discontinued        500 mg Oral Every 12 hours 08/02/22 1446 08/04/22 0915   08/02/22 1600  ceFEPIme (MAXIPIME) 2 g in sodium chloride 0.9 % 100 mL IVPB  Status:  Discontinued        2 g 200 mL/hr over 30 Minutes Intravenous Every 12 hours 08/02/22 1506 08/04/22 0915   08/02/22 1600  vancomycin (VANCOREADY) IVPB 750 mg/150 mL        750 mg 150 mL/hr over 60 Minutes Intravenous Every 24 hours 08/02/22 1520     08/02/22 1000  cefTRIAXone (ROCEPHIN) 2 g in sodium chloride 0.9 % 100 mL IVPB  Status:  Discontinued        2 g 200 mL/hr over 30 Minutes Intravenous Every 24 hours 08/01/22 1841 08/02/22 1506   08/01/22 2000  micafungin (MYCAMINE) 100 mg in sodium chloride 0.9 % 100 mL IVPB        100 mg 105 mL/hr over 1 Hours Intravenous Every 24 hours 08/01/22 1835     07/30/22 1800  vancomycin (VANCOREADY) IVPB 750 mg/150 mL  Status:  Discontinued        750 mg 150 mL/hr over 60 Minutes Intravenous Every 24 hours 07/29/22 1825 07/31/22 0924   07/30/22 0500  ceFEPIme (MAXIPIME) 2 g in sodium chloride 0.9 % 100 mL IVPB        2 g 200 mL/hr over 30 Minutes Intravenous Every 12 hours 07/29/22 1825 08/01/22 2130   07/29/22 1630  ceFEPIme (MAXIPIME) 2 g  in sodium chloride 0.9 % 100 mL IVPB        2 g 200 mL/hr over 30 Minutes Intravenous  Once 07/29/22 1617 07/29/22 1818   07/29/22 1630  vancomycin (VANCOCIN) IVPB 1000 mg/200 mL premix        1,000 mg 200 mL/hr over 60 Minutes Intravenous  Once 07/29/22 1628 07/30/22 0749       Subjective: Spanish interpreter used Sleepy today after procedure  Objective: Vitals:   08/05/22 1137 08/05/22 1204 08/05/22 1248 08/05/22 1458  BP: (!) 114/53 (!) 113/48 95/60 (!) 90/56  Pulse: (!) 118 (!) 110 100 81  Resp: (!) 24 (!) 26 20 (!) 23  Temp: (!) 102.5 F (39.2 C) (!) 102.4 F (39.1 C) 99.2 F (37.3 C) (!) 97.5 F (36.4 C)  TempSrc: Oral Oral Oral Oral  SpO2: 97% 99% 99% 100%  Weight:      Height:        Intake/Output Summary (Last 24 hours) at 08/05/2022 1514 Last data filed at 08/05/2022 1458 Gross per 24 hour  Intake 5100.69 ml  Output --  Net 5100.69 ml   Filed Weights   07/29/22 1747 07/29/22  1748 08/05/22 0929  Weight: 55 kg 52.1 kg 52.1 kg    Examination:  General: No acute distress. Cardiovascular: RRR Lungs: coarse lung sounds bilaterally Abdomen: Soft, nontender, nondistended Neurological: lethargic after procedure - she awakens to voice, but doesn't say much to me today Extremities: No clubbing or cyanosis. No edema.   Data Reviewed: I have personally reviewed following labs and imaging studies  CBC: Recent Labs  Lab 07/30/22 1010 07/31/22 0332 08/01/22 0322 08/02/22 1618 08/03/22 0711 08/04/22 0846 08/05/22 0406  WBC 1.1*   < > 1.1* 1.0* 0.7* 0.8* 1.0*  NEUTROABS 0.1*  --   --  0.2* 0.2* 0.1* 0.1*  HGB 8.9*   < > 8.5* 7.2* 8.2* 7.2* 7.8*  HCT 26.6*   < > 25.3* 21.4* 24.4* 22.3* 23.1*  MCV 86.1   < > 84.3 84.6 83.0 85.8 84.9  PLT 19*   < > 8* 17* 17* 9* 26*   < > = values in this interval not displayed.    Basic Metabolic Panel: Recent Labs  Lab 07/31/22 0332 08/01/22 0322 08/03/22 0711 08/04/22 0846 08/05/22 0406  NA 130* 131* 129* 128*  132*  K 3.8 3.9 3.4* 3.2* 4.1  CL 102 99 96* 99 103  CO2 20* 22 19* 20* 21*  GLUCOSE 123* 241* 212* 132* 222*  BUN 13 15 17 21 15   CREATININE 0.54 0.57 0.74 0.62 0.59  CALCIUM 8.3* 8.6* 8.7* 8.1* 8.0*  MG  --   --   --  1.4* 2.3  PHOS  --   --   --  1.8* 2.2*    GFR: Estimated Creatinine Clearance: 51.5 mL/min (by C-G formula based on SCr of 0.59 mg/dL).  Liver Function Tests: Recent Labs  Lab 07/31/22 0332 08/01/22 0322 08/04/22 0846 08/05/22 0406  AST 17 20 18 18   ALT 13 13 14 13   ALKPHOS 65 72 55 58  BILITOT 0.6 0.4 0.9 0.7  PROT 8.2* 8.1 7.3 7.2  ALBUMIN 3.2* 3.0* 2.5* 2.3*    CBG: Recent Labs  Lab 08/04/22 1214 08/04/22 1633 08/04/22 2049 08/05/22 0733 08/05/22 1134  GLUCAP 128* 90 270* 139* 101*     Recent Results (from the past 240 hour(s))  Culture, blood (Routine X 2) w Reflex to ID Panel     Status: None   Collection Time: 07/29/22 10:12 AM   Specimen: BLOOD  Result Value Ref Range Status   Specimen Description   Final    BLOOD Performed at [facility], [address], [place], [place] [address]    Special Requests   Final    BLOOD Performed at [facility], [address] [place], [place] [address]    Culture   Final    NO GROWTH 5 DAYS Performed at [facility], [address], [place], [place] [address]    Report Status 08/03/2022 FINAL  Final  Culture, blood (Routine X 2) w Reflex to ID Panel     Status: None   Collection Time: 07/29/22 10:14 AM   Specimen: BLOOD  Result Value Ref Range Status   Specimen Description   Final    BLOOD Performed at [facility], [address] [place], [place] [address]    Special Requests   Final    BLOOD Performed at [facility], [address], [place], [place] [address]    Culture   Final    NO GROWTH 5 DAYS  Performed at [facility], [address] [place], [place] [address]     Report Status 08/03/2022 FINAL  Final  Urine Culture     Status: Abnormal   Collection Time: 07/29/22 10:38 AM   Specimen: Urine, Random  Result Value Ref Range Status   Specimen Description   Final    URINE, RANDOM Performed at [facility], [address] [place], [place] [address]    Special Requests   Final    NONE Performed at [facility], [address], [place], [place] [address]    Culture 10,000 COLONIES/mL ESCHERICHIA COLI (Reiss Mowrey)  Final   Report Status 07/31/2022 FINAL  Final   Organism ID, Bacteria ESCHERICHIA COLI (Neiko Trivedi)  Final      Susceptibility   Escherichia coli - MIC*    AMPICILLIN <=2 SENSITIVE Sensitive     CEFAZOLIN <=4 SENSITIVE Sensitive     CEFEPIME <=0.12 SENSITIVE Sensitive     CEFTRIAXONE <=0.25 SENSITIVE Sensitive     CIPROFLOXACIN <=0.25 SENSITIVE Sensitive     GENTAMICIN <=1 SENSITIVE Sensitive     IMIPENEM <=0.25 SENSITIVE Sensitive     NITROFURANTOIN <=16 SENSITIVE Sensitive     TRIMETH/SULFA <=20 SENSITIVE Sensitive     AMPICILLIN/SULBACTAM <=2 SENSITIVE Sensitive     PIP/TAZO <=4 SENSITIVE Sensitive     * 10,000 COLONIES/mL ESCHERICHIA COLI  Resp panel by RT-PCR (RSV, Flu Pricsilla Lindvall&B, Covid) Anterior Nasal Swab     Status: None   Collection Time: 07/29/22  2:39 PM   Specimen: Anterior Nasal Swab  Result Value Ref Range Status   SARS Coronavirus 2 by RT PCR NEGATIVE NEGATIVE Final    Comment: (NOTE) SARS-CoV-2 target nucleic acids are NOT DETECTED.  The SARS-CoV-2 RNA is generally detectable in upper respiratory specimens during the acute phase of infection. The lowest concentration of SARS-CoV-2 viral copies this assay can detect is 138 copies/mL. Analeigh Aries negative result does not preclude SARS-Cov-2 infection and should not be used as the sole basis for treatment or other patient management decisions. Yarimar Lavis negative result may occur with  improper specimen collection/handling, submission of specimen other than  nasopharyngeal swab, presence of viral mutation(s) within the areas targeted by this assay, and inadequate number of viral copies(<138 copies/mL). Talah Cookston negative result must be combined with clinical observations, patient history, and epidemiological information. The expected result is Negative.  Fact Sheet for Patients:  BloggerCourse.com  Fact Sheet for Healthcare Providers:  SeriousBroker.it  This test is no t yet approved or cleared by the [place] FDA and  has been authorized for detection and/or diagnosis of SARS-CoV-2 by FDA under an Emergency Use Authorization (EUA). This EUA will remain  in effect (meaning this test can be used) for the duration of the COVID-19 declaration under Section 564(b)(1) of the Act, 21 U.S.C.section 360bbb-3(b)(1), unless the authorization is terminated  or revoked sooner.       Influenza Keyshla Tunison by PCR NEGATIVE NEGATIVE Final   Influenza B by PCR NEGATIVE NEGATIVE Final    Comment: (NOTE) The Xpert Xpress SARS-CoV-2/FLU/RSV plus assay is intended as an aid in the diagnosis of influenza from Nasopharyngeal swab specimens and should not be used as Akiel Fennell sole basis for treatment. Nasal washings and aspirates are unacceptable for Xpert Xpress SARS-CoV-2/FLU/RSV testing.  Fact Sheet for Patients: BloggerCourse.com  Fact Sheet for Healthcare Providers: SeriousBroker.it  This test is not yet approved or cleared by the [place] FDA and has been authorized for detection and/or  diagnosis of SARS-CoV-2 by FDA under an Emergency Use Authorization (EUA). This EUA will remain in effect (meaning this test can be used) for the duration of the COVID-19 declaration under Section 564(b)(1) of the Act, 21 U.S.C. section 360bbb-3(b)(1), unless the authorization is terminated or revoked.     Resp Syncytial Virus by PCR NEGATIVE NEGATIVE Final    Comment:  (NOTE) Fact Sheet for Patients: BloggerCourse.com  Fact Sheet for Healthcare Providers: SeriousBroker.it  This test is not yet approved or cleared by the [place] FDA and has been authorized for detection and/or diagnosis of SARS-CoV-2 by FDA under an Emergency Use Authorization (EUA). This EUA will remain in effect (meaning this test can be used) for the duration of the COVID-19 declaration under Section 564(b)(1) of the Act, 21 U.S.C. section 360bbb-3(b)(1), unless the authorization is terminated or revoked.  Performed at [facility], [address] [place], [place] [address]   Group Rosaline Ezekiel Strep by PCR     Status: None   Collection Time: 07/29/22  2:40 PM   Specimen: Throat; Sterile Swab  Result Value Ref Range Status   Group Norwood Quezada Strep by PCR NOT DETECTED NOT DETECTED Final    Comment: Performed at [facility], [address], [place], [place] [address]  Culture, blood (Routine X 2) w Reflex to ID Panel     Status: None (Preliminary result)   Collection Time: 08/04/22  5:34 AM   Specimen: BLOOD  Result Value Ref Range Status   Specimen Description   Final    BLOOD BLOOD RIGHT HAND Performed at [facility], [address] [place], [place] [address]    Special Requests   Final    BOTTLES DRAWN AEROBIC ONLY Blood Culture adequate volume Performed at [facility], [address] [place], [place] [address]    Culture   Final    NO GROWTH 1 DAY Performed at [facility], [address] [place], [place] [address]    Report Status PENDING  Incomplete  Culture, blood (Routine X 2) w Reflex to ID Panel     Status: None (Preliminary result)   Collection Time: 08/04/22  5:34 AM   Specimen: BLOOD  Result Value Ref Range Status   Specimen Description   Final    BLOOD RIGHT WRIST Performed at [facility], [address]  [place], [place] [address]    Special Requests   Final    BOTTLES DRAWN AEROBIC ONLY Blood Culture adequate volume Performed at [facility], [address], [place], [place] [address]    Culture   Final    NO GROWTH 1 DAY Performed at [facility], [address] [place], [place] [address]    Report Status PENDING  Incomplete  Expectorated Sputum Assessment w Gram Stain, Rflx to Resp Cult     Status: None   Collection Time: 08/04/22  7:44 PM   Specimen: Expectorated Sputum  Result Value Ref Range Status   Specimen Description EXPECTORATED SPUTUM  Final   Special Requests NONE  Final   Sputum evaluation   Final    Sputum specimen not acceptable for testing.  Please recollect.   NOTIFIED JEREMY RN Performed at [facility], [address], [place], [place] [address]    Report Status 08/04/2022 FINAL  Final         Radiology Studies: DG CHEST PORT 1 VIEW  Result Date: 08/04/2022 CLINICAL  DATA:  67 year old female with history of hypertension, cancer and diabetes presenting with signs and symptoms of sepsis. EXAM: PORTABLE CHEST 1 VIEW COMPARISON:  Chest x-ray 07/29/2022. FINDINGS: Patchy ill-defined opacities are more apparent than the prior chest x-ray scattered throughout the lungs bilaterally, most evident in the left lung base, right upper lobe and medial aspect of the right lung base, concerning for progressive multilobar bilateral bronchopneumonia. No definite pleural effusions. No pneumothorax. No evidence of pulmonary edema. Heart size is normal. Upper mediastinal contours are within normal limits. Atherosclerotic calcifications in the thoracic aorta. Surgical clips in the left axilla from prior lymph node dissection. IMPRESSION: 1. Worsening multilobar bilateral bronchopneumonia, as above. 2. Aortic atherosclerosis. Electronically Signed   By: Trudie Reed M.D.   On: 08/04/2022 06:42        Scheduled Meds:  citalopram  20 mg Oral  Daily   insulin aspart  0-15 Units Subcutaneous TID WC   insulin aspart  0-5 Units Subcutaneous QHS   insulin aspart  5 Units Subcutaneous TID WC   insulin glargine-yfgn  10 Units Subcutaneous BID   lactulose  20 g Oral Daily   losartan  50 mg Oral Daily   mouth rinse  15 mL Mouth Rinse 4 times per day   pantoprazole  40 mg Oral QAC breakfast   Continuous Infusions:  meropenem (MERREM) IV 1 g (08/05/22 1428)   micafungin (MYCAMINE) 100 mg in sodium chloride 0.9 % 100 mL IVPB Stopped (08/04/22 2110)   vancomycin Stopped (08/04/22 1955)     LOS: 7 days    Time spent: 40 min critical care time    Lacretia Nicks, MD [facility]   To contact the attending provider between 7A-7P or the covering provider during after hours 7P-7A, please log into the web site [email or web address] and access using universal [place] password for that web site. If you do not have the password, please call the hospital operator.  08/05/2022, 3:14 PM

## 2022-08-05 NOTE — Plan of Care (Signed)
  Problem: Education: Goal: Knowledge of General Education information will improve Description: Including pain rating scale, medication(s)/side effects and non-pharmacologic comfort measures Outcome: Progressing   Problem: Health Behavior/Discharge Planning: Goal: Ability to manage health-related needs will improve Outcome: Progressing   Problem: Clinical Measurements: Goal: Ability to maintain clinical measurements within normal limits will improve Outcome: Progressing Goal: Cardiovascular complication will be avoided Outcome: Progressing   Problem: Activity: Goal: Risk for activity intolerance will decrease Outcome: Progressing   Problem: Coping: Goal: Level of anxiety will decrease Outcome: Progressing   Problem: Elimination: Goal: Will not experience complications related to bowel motility Outcome: Progressing Goal: Will not experience complications related to urinary retention Outcome: Progressing   Problem: Pain Managment: Goal: General experience of comfort will improve Outcome: Progressing   Problem: Safety: Goal: Ability to remain free from injury will improve Outcome: Progressing   Problem: Skin Integrity: Goal: Risk for impaired skin integrity will decrease Outcome: Progressing   Problem: Clinical Measurements: Goal: Will remain free from infection Outcome: Not Progressing Goal: Diagnostic test results will improve Outcome: Not Progressing Goal: Respiratory complications will improve Outcome: Not Progressing   Problem: Nutrition: Goal: Adequate nutrition will be maintained Outcome: Not Progressing

## 2022-08-05 NOTE — Progress Notes (Signed)
Amanda Duarte   DOB:12-Jun-1955   ZO#:[id number]   JWJ#:[id number]   Heme-onc follow-up note  Subjective: Patient underwent a bronchoscopy by Dr. Vassie Loll this morning, culture results are still pending.  She was moved into ICU, currently on nasal cannula oxygen 4 L/min.  No respiratory distress, vital signs stable.  I spoke with her daughter Kaleen Odea on the phone.  Objective:  Vitals:   08/05/22 2046 08/05/22 2105  BP: (!) 84/40 (!) 86/36  Pulse:  69  Resp:  (!) 22  Temp:  97.9 F (36.6 C)  SpO2:  100%    Body mass index is 21.7 kg/m.  Intake/Output Summary (Last 24 hours) at 08/05/2022 2235 Last data filed at 08/05/2022 1819 Gross per 24 hour  Intake 3841.22 ml  Output --  Net 3841.22 ml     Sclerae unicteric, (+) edema and skin erythema around the right eye  Oropharynx clear  No peripheral adenopathy  Lungs clear -- no rales or rhonchi  Heart regular rate and rhythm  Abdomen benign  MSK no focal spinal tenderness, no peripheral edema  Neuro nonfocal    CBG (last 3)  Recent Labs    08/05/22 1649 08/05/22 1759 08/05/22 2216  GLUCAP 132* 163* 256*     Labs:   Urine Studies No results for input(s): "UHGB", "CRYS" in the last 72 hours.  Invalid input(s): "UACOL", "UAPR", "USPG", "UPH", "UTP", "UGL", "UKET", "UBIL", "UNIT", "UROB", "ULEU", "UEPI", "UWBC", "URBC", "UBAC", "CAST", "UCOM", "BILUA"  Basic Metabolic Panel: Recent Labs  Lab 07/31/22 0332 08/01/22 0322 08/03/22 0711 08/04/22 0846 08/05/22 0406  NA 130* 131* 129* 128* 132*  K 3.8 3.9 3.4* 3.2* 4.1  CL 102 99 96* 99 103  CO2 20* 22 19* 20* 21*  GLUCOSE 123* 241* 212* 132* 222*  BUN 13 15 17 21 15   CREATININE 0.54 0.57 0.74 0.62 0.59  CALCIUM 8.3* 8.6* 8.7* 8.1* 8.0*  MG  --   --   --  1.4* 2.3  PHOS  --   --   --  1.8* 2.2*   GFR Estimated Creatinine Clearance: 51.5 mL/min (by C-G formula based on SCr of 0.59 mg/dL). Liver Function Tests: Recent Labs  Lab 07/31/22 0332 08/01/22 0322 08/04/22 0846  08/05/22 0406  AST 17 20 18 18   ALT 13 13 14 13   ALKPHOS 65 72 55 58  BILITOT 0.6 0.4 0.9 0.7  PROT 8.2* 8.1 7.3 7.2  ALBUMIN 3.2* 3.0* 2.5* 2.3*   No results for input(s): "LIPASE", "AMYLASE" in the last 168 hours.  Recent Labs  Lab 08/04/22 1309 08/05/22 0406  AMMONIA 57* 41*   Coagulation profile No results for input(s): "INR", "PROTIME" in the last 168 hours.  CBC: Recent Labs  Lab 07/30/22 1010 07/31/22 0332 08/02/22 1618 08/03/22 0711 08/04/22 0846 08/05/22 0406 08/05/22 1659  WBC 1.1*   < > 1.0* 0.7* 0.8* 1.0* 0.8*  NEUTROABS 0.1*  --  0.2* 0.2* 0.1* 0.1*  --   HGB 8.9*   < > 7.2* 8.2* 7.2* 7.8* 7.2*  HCT 26.6*   < > 21.4* 24.4* 22.3* 23.1* 20.8*  MCV 86.1   < > 84.6 83.0 85.8 84.9 86.3  PLT 19*   < > 17* 17* 9* 26* 41*   < > = values in this interval not displayed.   Cardiac Enzymes: No results for input(s): "CKTOTAL", "CKMB", "CKMBINDEX", "TROPONINI" in the last 168 hours. BNP: Invalid input(s): "POCBNP" CBG: Recent Labs  Lab 08/05/22 0733 08/05/22 1134 08/05/22 1649 08/05/22  1759 08/05/22 2216  GLUCAP 139* 101* 132* 163* 256*   D-Dimer No results for input(s): "DDIMER" in the last 72 hours. Hgb A1c No results for input(s): "HGBA1C" in the last 72 hours.  Lipid Profile No results for input(s): "CHOL", "HDL", "LDLCALC", "TRIG", "CHOLHDL", "LDLDIRECT" in the last 72 hours. Thyroid function studies Recent Labs    08/05/22 0406  TSH 1.756   Anemia work up Recent Labs    08/05/22 0406  VITAMINB12 1,048*  FOLATE 12.4   Microbiology Recent Results (from the past 240 hour(s))  Culture, blood (Routine X 2) w Reflex to ID Panel     Status: None   Collection Time: 07/29/22 10:12 AM   Specimen: BLOOD  Result Value Ref Range Status   Specimen Description   Final    BLOOD Performed at [facility], [address] [place], [place] [address]    Special Requests   Final    BLOOD Performed at [facility], [address] [place], [place] [address]    Culture   Final    NO GROWTH 5 DAYS Performed at [facility], [address] [place], [place] [address]    Report Status 08/03/2022 FINAL  Final  Culture, blood (Routine X 2) w Reflex to ID Panel     Status: None   Collection Time: 07/29/22 10:14 AM   Specimen: BLOOD  Result Value Ref Range Status   Specimen Description   Final    BLOOD Performed at [facility], [address] [place], [place] [address]    Special Requests   Final    BLOOD Performed at [facility], [address], [place], [place] [address]    Culture   Final    NO GROWTH 5 DAYS Performed at [facility], [address] [place], [place] [address]    Report Status 08/03/2022 FINAL  Final  Urine Culture     Status: Abnormal   Collection Time: 07/29/22 10:38 AM   Specimen: Urine, Random  Result Value Ref Range Status   Specimen Description   Final    URINE, RANDOM Performed at [facility], [address], [place], [place] [address]    Special Requests   Final    NONE Performed at [facility], [address] [place], [place] [address]    Culture 10,000 COLONIES/mL ESCHERICHIA COLI (A)  Final   Report Status 07/31/2022 FINAL  Final   Organism ID, Bacteria ESCHERICHIA COLI (A)  Final      Susceptibility   Escherichia coli - MIC*    AMPICILLIN <=2 SENSITIVE Sensitive     CEFAZOLIN <=4 SENSITIVE Sensitive     CEFEPIME <=0.12 SENSITIVE Sensitive     CEFTRIAXONE <=0.25 SENSITIVE Sensitive     CIPROFLOXACIN <=0.25 SENSITIVE Sensitive     GENTAMICIN <=1 SENSITIVE Sensitive     IMIPENEM <=0.25 SENSITIVE Sensitive     NITROFURANTOIN <=16 SENSITIVE Sensitive     TRIMETH/SULFA <=20 SENSITIVE Sensitive     AMPICILLIN/SULBACTAM <=2 SENSITIVE Sensitive     PIP/TAZO <=4 SENSITIVE Sensitive     * 10,000 COLONIES/mL ESCHERICHIA COLI   Resp panel by RT-PCR (RSV, Flu A&B, Covid) Anterior Nasal Swab     Status: None   Collection Time: 07/29/22  2:39 PM   Specimen: Anterior Nasal Swab  Result Value Ref Range Status   SARS Coronavirus 2 by RT PCR  NEGATIVE NEGATIVE Final    Comment: (NOTE) SARS-CoV-2 target nucleic acids are NOT DETECTED.  The SARS-CoV-2 RNA is generally detectable in upper respiratory specimens during the acute phase of infection. The lowest concentration of SARS-CoV-2 viral copies this assay can detect is 138 copies/mL. A negative result does not preclude SARS-Cov-2 infection and should not be used as the sole basis for treatment or other patient management decisions. A negative result may occur with  improper specimen collection/handling, submission of specimen other than nasopharyngeal swab, presence of viral mutation(s) within the areas targeted by this assay, and inadequate number of viral copies(<138 copies/mL). A negative result must be combined with clinical observations, patient history, and epidemiological information. The expected result is Negative.  Fact Sheet for Patients:  BloggerCourse.com  Fact Sheet for Healthcare Providers:  SeriousBroker.it  This test is no t yet approved or cleared by the [place] FDA and  has been authorized for detection and/or diagnosis of SARS-CoV-2 by FDA under an Emergency Use Authorization (EUA). This EUA will remain  in effect (meaning this test can be used) for the duration of the COVID-19 declaration under Section 564(b)(1) of the Act, 21 U.S.C.section 360bbb-3(b)(1), unless the authorization is terminated  or revoked sooner.       Influenza A by PCR NEGATIVE NEGATIVE Final   Influenza B by PCR NEGATIVE NEGATIVE Final    Comment: (NOTE) The Xpert Xpress SARS-CoV-2/FLU/RSV plus assay is intended as an aid in the diagnosis of influenza from Nasopharyngeal swab specimens and should not be used  as a sole basis for treatment. Nasal washings and aspirates are unacceptable for Xpert Xpress SARS-CoV-2/FLU/RSV testing.  Fact Sheet for Patients: BloggerCourse.com  Fact Sheet for Healthcare Providers: SeriousBroker.it  This test is not yet approved or cleared by the [place] FDA and has been authorized for detection and/or diagnosis of SARS-CoV-2 by FDA under an Emergency Use Authorization (EUA). This EUA will remain in effect (meaning this test can be used) for the duration of the COVID-19 declaration under Section 564(b)(1) of the Act, 21 U.S.C. section 360bbb-3(b)(1), unless the authorization is terminated or revoked.     Resp Syncytial Virus by PCR NEGATIVE NEGATIVE Final    Comment: (NOTE) Fact Sheet for Patients: BloggerCourse.com  Fact Sheet for Healthcare Providers: SeriousBroker.it  This test is not yet approved or cleared by the [place] FDA and has been authorized for detection and/or diagnosis of SARS-CoV-2 by FDA under an Emergency Use Authorization (EUA). This EUA will remain in effect (meaning this test can be used) for the duration of the COVID-19 declaration under Section 564(b)(1) of the Act, 21 U.S.C. section 360bbb-3(b)(1), unless the authorization is terminated or revoked.  Performed at [facility], [address] [place], [place] [address]   Group A Strep by PCR     Status: None   Collection Time: 07/29/22  2:40 PM   Specimen: Throat; Sterile Swab  Result Value Ref Range Status   Group A Strep by PCR NOT DETECTED NOT DETECTED Final    Comment: Performed at [facility], [address] [place], [place] [address]  Culture, blood (Routine X 2) w Reflex to ID Panel     Status: None (Preliminary result)   Collection Time: 08/04/22  5:34 AM   Specimen: BLOOD  Result Value Ref Range Status   Specimen  Description   Final    BLOOD BLOOD RIGHT HAND Performed at [facility], [address] [place], [place] [address]  Special Requests   Final    BOTTLES DRAWN AEROBIC ONLY Blood Culture adequate volume Performed at [facility], [address], [place], [place] [address]    Culture   Final    NO GROWTH 1 DAY Performed at [facility], [address], [place], [place] [address]    Report Status PENDING  Incomplete  Culture, blood (Routine X 2) w Reflex to ID Panel     Status: None (Preliminary result)   Collection Time: 08/04/22  5:34 AM   Specimen: BLOOD  Result Value Ref Range Status   Specimen Description   Final    BLOOD RIGHT WRIST Performed at [facility], [address], [place], [place] [address]    Special Requests   Final    BOTTLES DRAWN AEROBIC ONLY Blood Culture adequate volume Performed at [facility], [address] [place], [place] [address]    Culture   Final    NO GROWTH 1 DAY Performed at [facility], [address], [place], [place] [address]    Report Status PENDING  Incomplete  Expectorated Sputum Assessment w Gram Stain, Rflx to Resp Cult     Status: None   Collection Time: 08/04/22  7:44 PM   Specimen: Expectorated Sputum  Result Value Ref Range Status   Specimen Description EXPECTORATED SPUTUM  Final   Special Requests NONE  Final   Sputum evaluation   Final    Sputum specimen not acceptable for testing.  Please recollect.   NOTIFIED JEREMY RN Performed at [facility], [address] [place], [place] [address]    Report Status 08/04/2022 FINAL  Final  Body fluid culture w Gram Stain     Status: None (Preliminary result)   Collection Time: 08/05/22 10:23 AM   Specimen: PATH Cytology Bronchial lavage; Body Fluid  Result Value Ref Range Status   Specimen Description   Final    BRONCHIAL ALVEOLAR LAVAGE L Performed at [facility], [address] [place], [place] [address]    Special Requests   Final    NONE Performed at [facility], [address], [place], [place] [address]    Gram Stain   Final    NO WBC SEEN NO ORGANISMS SEEN Performed at [facility], [address], [place], [place] [address]    Culture PENDING  Incomplete   Report Status PENDING  Incomplete  MRSA Next Gen by PCR, Nasal     Status: None   Collection Time: 08/05/22  7:07 PM   Specimen: Nasal Mucosa; Nasal Swab  Result Value Ref Range Status   MRSA by PCR Next Gen NOT DETECTED NOT DETECTED Final    Comment: (NOTE) The GeneXpert MRSA Assay (FDA approved for NASAL specimens only), is one component of a comprehensive MRSA colonization surveillance program. It is not intended to diagnose MRSA infection nor to guide or monitor treatment for MRSA infections. Test performance is not FDA approved in patients less than 79 years old. Performed at [facility], [address], [place], [place] [address]       Studies:  DG CHEST PORT 1 VIEW  Result Date: 08/05/2022 CLINICAL DATA:  [id number] Multifocal pneumonia [id number]. EXAM: PORTABLE CHEST 1 VIEW COMPARISON:  07/05/2022. FINDINGS: Increasing hazy opacities in the left mid and lower lung, as well as the right upper lobe, consistent with multifocal pneumonia. Stable cardiac and mediastinal contours. Possible small  left parapneumonic effusion. No pneumothorax. IMPRESSION: Increasing multifocal pneumonia, left greater than right. Possible small left parapneumonic effusion. Electronically Signed   By: Orvan Falconer M.D.   On: 08/05/2022 15:21   DG CHEST PORT 1 VIEW  Result Date: 08/04/2022 CLINICAL DATA:  67 year old female with history of hypertension, cancer and diabetes presenting with signs and symptoms of sepsis. EXAM: PORTABLE CHEST 1 VIEW COMPARISON:  Chest x-ray 07/29/2022. FINDINGS: Patchy ill-defined opacities are more  apparent than the prior chest x-ray scattered throughout the lungs bilaterally, most evident in the left lung base, right upper lobe and medial aspect of the right lung base, concerning for progressive multilobar bilateral bronchopneumonia. No definite pleural effusions. No pneumothorax. No evidence of pulmonary edema. Heart size is normal. Upper mediastinal contours are within normal limits. Atherosclerotic calcifications in the thoracic aorta. Surgical clips in the left axilla from prior lymph node dissection. IMPRESSION: 1. Worsening multilobar bilateral bronchopneumonia, as above. 2. Aortic atherosclerosis. Electronically Signed   By: Trudie Reed M.D.   On: 08/04/2022 06:42    Assessment: 67 y.o. 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 -She has developed hypoxia, required some oxygen, she is in ICU now with stable vital signs -Continue broad antibiotics per ID and primary team -I am concerned that the condition may continue worsening, and there is high possibility she may require intubation and life support due to her severe compromised immune system.  I spoke with her daughter regarding the CODE STATUS, and plan to meet pt and her family in person tomorrow to see what they wish. She is full code now.   Malachy Mood, MD 08/05/2022

## 2022-08-05 NOTE — Care Management Important Message (Signed)
Important Message  Patient Details IM Letter given Name: Amanda Duarte MRN: [id number] Date of Birth: 29-Oct-1955   Medicare Important Message Given:  Yes     Caren Macadam 08/05/2022, 12:33 PM

## 2022-08-05 NOTE — Anesthesia Procedure Notes (Signed)
Procedure Name: Intubation Date/Time: 08/05/2022 10:09 AM  Performed by: Basilio Cairo, CRNAPre-anesthesia Checklist: Patient identified, Patient being monitored, Timeout performed, Emergency Drugs available and Suction available Patient Re-evaluated:Patient Re-evaluated prior to induction Oxygen Delivery Method: Circle system utilized Preoxygenation: Pre-oxygenation with 100% oxygen Induction Type: IV induction Ventilation: Mask ventilation without difficulty Laryngoscope Size: Mac and 3 Grade View: Grade I Tube type: Oral Tube size: 9.0 mm Number of attempts: 1 Placement Confirmation: ETT inserted through vocal cords under direct vision, positive ETCO2 and breath sounds checked- equal and bilateral Secured at: 21 cm Tube secured with: Tape Dental Injury: Teeth and Oropharynx as per pre-operative assessment

## 2022-08-05 NOTE — Progress Notes (Signed)
Chaplain attempted to complete Spiritual Care consult but patient was not alert. Follow up needed.  [address] [place]   08/05/22 1545  Spiritual Encounters  Type of Visit Attempt (pt unavailable)  Care provided to: Pt not available  Referral source Nurse (RN/NT/LPN)  Reason for visit Routine spiritual support  OnCall Visit No

## 2022-08-05 NOTE — Progress Notes (Addendum)
    Patient Name: RADHIKA BARTOSH           DOB: 01/31/1955  MRN: [id number]      Admission Date: 07/29/2022  Attending Provider: Zigmund Daniel., *  Primary Diagnosis: Neutropenic fever [facility])   Level of care: Progressive    CROSS COVER NOTE   Date of Service   08/05/2022   EBBA KLOOS, 67 y.o. female, was admitted on 07/29/2022 for Neutropenic fever (HCC).   Multifocal pneumonia  E. coli UTI Blood cultures NGTD (repeat 7/11 pending) ID assisting, currently on meropenem, vanc, micafungin Pulmonary assisting, plan for bronc 7/12 at 10 AM   HPI/Events of Note   Bedside RN reporting tachycardia (120-130s), tachypnea, febrile 103.1 F.   Patient has no complaints or discomforts.  Reports feeling comfortable. Denies pain, SOB, anxiety, palpitations, abdominal discomfort, nausea or vomiting.  2330-  lactic acid elevated, 3.7. Provide fluid bolus and repeat LA.    Interventions/ Plan   Lactic acid, repeat if  >2.0 Fluid bolus, 1.5L total        Anthoney Harada, DNP, [employer] [facility] [place]

## 2022-08-05 NOTE — Progress Notes (Signed)
[facility] Infectious Diseases Follow Up Note  Patient Identification: Patient Name: Amanda Duarte MRN: [id number] Admit Date: 07/29/2022 12:25 PM Age: 67 y.o.Today's Date: 08/05/2022   Reason for Visit: Respiratory failure, abnormal CT chest and severe neutropenia  Principal Problem:   Neutropenic fever (HCC)   Antibiotics: Vancomycin Meropenem  Micafungin  Total days of antibiotics 8  Lines/Hardwares:   Interval Events: Continues to have daily fever around 102 range  Labs remarkable for lactic acid 3>2.4, WBC 1, hemoglobin 7.8, platelets 26 ANC 100  Assessment 67 year old female with history of breast cancer, MDS complicated with severe pancytopenia after being azacitidine in November 15, 2021 with prolonged hospitalization complicated with neutropenic fever as well as intubation/mechanical ventilation, QT prolonged patient on voriconazole, followed by another admission in June 2024 for right orbital cellulitis when transferred to [facility], found to have Dacrocystitis status post bedside I&D with no purulence encountered Staph capitis on culture as well as endoscopy by ENT with no purulence and discharged on Augmentin admitted with  # Neutropenic fevers: Presumed source is pulmonary at this point. CT chest 7/9 multifocal airspace disease with tree-in-bud and nodular opacities throughout all lobes of both lungs.  More confluent areas of consolidation involving the left lower lobe.  There is also septal thickening.  Lungs consistent with multifocal pneumonia.  Mild splenomegaly. Bronch today after Plt transfusion " copious amount of light green secretions were noted especially in left lower lobe bronchus.  These were suctioned out.  No endobronchial lesions seen. "  7/9 Cryptococcal ag negative, strep pneumo ag negative   # Dacrocystitis s/p treatment - improved   # Unresolved paranasal sinusitis on imaging   # 7/5 urine cx with E coli  - UA is not suggestive of infection, already covered with abtx   # Prolonged Qtc during hospital admission in Nov 2023 after starting Voriconazole leading to  ICU admission and Torsades # Pancytopenia - no signs of bleeding. Oncology following   Recommendations Continue Vancomycin, pharmacy to dose, meropenem and micafungin. Microbial etiology is unclear at this point. She is definitely at risk for invasive fungal infections like Aspergillus, Mucor etc  Qtc from 7/11 at 407, however I am not inclined to add azoles currently although micafungin will not have mold coverage as it appears she is very sensitive to azole. Reviewed her hospitalization in 11/2021 she had  qtc 383 in 12/06/2021 prior to starting Viriconazole and went up to 559 the following day requiring to hold voriconazole/ICU stay and Torsades. Further antifungal guidance will depend upon BAL cultures and serology   She is getting bronchoscopy today.  Aerobic/anaerobic culture and Gram stain, fungal stain and fungal culture, cytology have been sent. I have added on AFB stain and culture, PJP smear and galactomannan antigen in  BAL.  I will also add galactomannan antigen in the serum and blastomyces ag in urine   Fu legionella ag, histoplasma ag, fungitell , blood cultures  Consider ENT evaluation of given unresolved sinusitis seen in CT Monitor fevers, CBC, CMP on abtx Electrolytes, transfusion requirements per primary/oncology   Dr Drue Second covering this weekend and will follow.   Rest of the management as per the primary team. Thank you for the consult. Please page with pertinent questions or concerns.  ______________________________________________________________________ Subjective patient seen and examined at the bedside. Brother at bedside, spoke with video interpreter after BAL ongoing coughing with not much phlegm production. No new complaints.   Past Medical History:  Diagnosis Date   Cancer (HCC) 03/2017  left breast  cancer   Diabetes mellitus without complication (HCC)    Hyperlipidemia    Hypertension    Malignant neoplasm of left breast (HCC)    Stroke (cerebrum) (HCC)    Tibial plateau fracture, left    04-13-17 had ORIF   Past Surgical History:  Procedure Laterality Date   MASTECTOMY Left 2019   MASTECTOMY MODIFIED RADICAL Left 11/20/2017   Procedure: LEFT MODIFIED RADICAL MASTECTOMY;  Surgeon: Claud Kelp, MD;  Location: [facility] OR;  Service: General;  Laterality: Left;   ORIF TIBIA PLATEAU Left 04/13/2017   Procedure: OPEN REDUCTION INTERNAL FIXATION (ORIF) TIBIAL PLATEAU;  Surgeon: Roby Lofts, MD;  Location: [facility] OR;  Service: Orthopedics;  Laterality: Left;   PORT-A-CATH REMOVAL Right 11/20/2017   Procedure: REMOVAL PORT-A-CATH;  Surgeon: Claud Kelp, MD;  Location: [facility] OR;  Service: General;  Laterality: Right;   PORTACATH PLACEMENT Right 05/31/2017   Procedure: INSERTION PORT-A-CATH;  Surgeon: Claud Kelp, MD;  Location: [facility];  Service: General;  Laterality: Right;    Vitals BP (!) 111/55 (BP Location: Right Arm)   Pulse (!) 101   Temp 98.8 F (37.1 C) (Oral)   Resp (!) 28   Ht 5' (1.524 m)   Wt 52.1 kg   SpO2 95%   BMI 22.42 kg/m   Physical Exam Constitutional: Elderly female lying in the bed and coughing    Comments: Not in acute distress, rt eye with no discharge, redness or swelling.   Cardiovascular:     Rate and Rhythm: Normal rate and regular rhythm.     Heart sounds: s1s2  Pulmonary:     Effort: Pulmonary effort is normal on [place]    Comments: Bilateral coarse breath sounds  Abdominal:     Palpations: Abdomen is soft.     Tenderness: Nondistended and nontender  Musculoskeletal:        General: No swelling or tenderness in peripheral joints  Skin:    Comments: No rashes  Neurological:   Pertinent Microbiology Results for orders placed or performed during the hospital encounter of 07/29/22  Resp panel by RT-PCR (RSV, Flu A&B,  Covid) Anterior Nasal Swab     Status: None   Collection Time: 07/29/22  2:39 PM   Specimen: Anterior Nasal Swab  Result Value Ref Range Status   SARS Coronavirus 2 by RT PCR NEGATIVE NEGATIVE Final    Comment: (NOTE) SARS-CoV-2 target nucleic acids are NOT DETECTED.  The SARS-CoV-2 RNA is generally detectable in upper respiratory specimens during the acute phase of infection. The lowest concentration of SARS-CoV-2 viral copies this assay can detect is 138 copies/mL. A negative result does not preclude SARS-Cov-2 infection and should not be used as the sole basis for treatment or other patient management decisions. A negative result may occur with  improper specimen collection/handling, submission of specimen other than nasopharyngeal swab, presence of viral mutation(s) within the areas targeted by this assay, and inadequate number of viral copies(<138 copies/mL). A negative result must be combined with clinical observations, patient history, and epidemiological information. The expected result is Negative.  Fact Sheet for Patients:  BloggerCourse.com  Fact Sheet for Healthcare Providers:  SeriousBroker.it  This test is no t yet approved or cleared by the [place] FDA and  has been authorized for detection and/or diagnosis of SARS-CoV-2 by FDA under an Emergency Use Authorization (EUA). This EUA will remain  in effect (meaning this test can be used) for the duration of the COVID-19 declaration under  Section 564(b)(1) of the Act, 21 U.S.C.section 360bbb-3(b)(1), unless the authorization is terminated  or revoked sooner.       Influenza A by PCR NEGATIVE NEGATIVE Final   Influenza B by PCR NEGATIVE NEGATIVE Final    Comment: (NOTE) The Xpert Xpress SARS-CoV-2/FLU/RSV plus assay is intended as an aid in the diagnosis of influenza from Nasopharyngeal swab specimens and should not be used as a sole basis for treatment. Nasal  washings and aspirates are unacceptable for Xpert Xpress SARS-CoV-2/FLU/RSV testing.  Fact Sheet for Patients: BloggerCourse.com  Fact Sheet for Healthcare Providers: SeriousBroker.it  This test is not yet approved or cleared by the [place] FDA and has been authorized for detection and/or diagnosis of SARS-CoV-2 by FDA under an Emergency Use Authorization (EUA). This EUA will remain in effect (meaning this test can be used) for the duration of the COVID-19 declaration under Section 564(b)(1) of the Act, 21 U.S.C. section 360bbb-3(b)(1), unless the authorization is terminated or revoked.     Resp Syncytial Virus by PCR NEGATIVE NEGATIVE Final    Comment: (NOTE) Fact Sheet for Patients: BloggerCourse.com  Fact Sheet for Healthcare Providers: SeriousBroker.it  This test is not yet approved or cleared by the [place] FDA and has been authorized for detection and/or diagnosis of SARS-CoV-2 by FDA under an Emergency Use Authorization (EUA). This EUA will remain in effect (meaning this test can be used) for the duration of the COVID-19 declaration under Section 564(b)(1) of the Act, 21 U.S.C. section 360bbb-3(b)(1), unless the authorization is terminated or revoked.  Performed at [facility], [address], [place], [place] [address]   Group A Strep by PCR     Status: None   Collection Time: 07/29/22  2:40 PM   Specimen: Throat; Sterile Swab  Result Value Ref Range Status   Group A Strep by PCR NOT DETECTED NOT DETECTED Final    Comment: Performed at [facility], [address], [place], [place] [address]  Expectorated Sputum Assessment w Gram Stain, Rflx to Resp Cult     Status: None   Collection Time: 08/04/22  7:44 PM   Specimen: Expectorated Sputum  Result Value Ref Range Status   Specimen Description EXPECTORATED SPUTUM   Final   Special Requests NONE  Final   Sputum evaluation   Final    Sputum specimen not acceptable for testing.  Please recollect.   NOTIFIED JEREMY RN Performed at [facility], [address] [place], [place] [address]    Report Status 08/04/2022 FINAL  Final   *Note: Due to a large number of results and/or encounters for the requested time period, some results have not been displayed. A complete set of results can be found in Results Review.    Pertinent Lab.    Latest Ref Rng & Units 08/05/2022    4:06 AM 08/04/2022    8:46 AM 08/03/2022    7:11 AM  CBC  WBC 4.0 - 10.5 K/uL 1.0  0.8  0.7   Hemoglobin 12.0 - 15.0 g/dL 7.8  7.2  8.2   Hematocrit 36.0 - 46.0 % 23.1  22.3  24.4   Platelets 150 - 400 K/uL 26  9  17        Latest Ref Rng & Units 08/05/2022    4:06 AM 08/04/2022    8:46 AM 08/03/2022    7:11 AM  CMP  Glucose 70 - 99 mg/dL 914  782  956   BUN 8 -  23 mg/dL 15  21  17    Creatinine 0.44 - 1.00 mg/dL 9.62  9.52  8.41   Sodium 135 - 145 mmol/L 132  128  129   Potassium 3.5 - 5.1 mmol/L 4.1  3.2  3.4   Chloride 98 - 111 mmol/L 103  99  96   CO2 22 - 32 mmol/L 21  20  19    Calcium 8.9 - 10.3 mg/dL 8.0  8.1  8.7   Total Protein 6.5 - 8.1 g/dL 7.2  7.3    Total Bilirubin 0.3 - 1.2 mg/dL 0.7  0.9    Alkaline Phos 38 - 126 U/L 58  55    AST 15 - 41 U/L 18  18    ALT 0 - 44 U/L 13  14       Pertinent Imaging today Plain films and CT images have been personally visualized and interpreted; radiology reports have been reviewed. Decision making incorporated into the Impression  DG CHEST PORT 1 VIEW  Result Date: 08/04/2022 CLINICAL DATA:  67 year old female with history of hypertension, cancer and diabetes presenting with signs and symptoms of sepsis. EXAM: PORTABLE CHEST 1 VIEW COMPARISON:  Chest x-ray 07/29/2022. FINDINGS: Patchy ill-defined opacities are more apparent than the prior chest x-ray scattered throughout the lungs bilaterally, most evident in  the left lung base, right upper lobe and medial aspect of the right lung base, concerning for progressive multilobar bilateral bronchopneumonia. No definite pleural effusions. No pneumothorax. No evidence of pulmonary edema. Heart size is normal. Upper mediastinal contours are within normal limits. Atherosclerotic calcifications in the thoracic aorta. Surgical clips in the left axilla from prior lymph node dissection. IMPRESSION: 1. Worsening multilobar bilateral bronchopneumonia, as above. 2. Aortic atherosclerosis. Electronically Signed   By: Trudie Reed M.D.   On: 08/04/2022 06:42   CT CHEST ABDOMEN PELVIS W CONTRAST  Result Date: 08/02/2022 CLINICAL DATA:  Sepsis. EXAM: CT CHEST, ABDOMEN, AND PELVIS WITH CONTRAST TECHNIQUE: Multidetector CT imaging of the chest, abdomen and pelvis was performed following the standard protocol during bolus administration of intravenous contrast. RADIATION DOSE REDUCTION: This exam was performed according to the departmental dose-optimization program which includes automated exposure control, adjustment of the mA and/or kV according to patient size and/or use of iterative reconstruction technique. CONTRAST:  OMNIPAQUE IOHEXOL 300 MG/ML  SOLN COMPARISON:  Chest abdomen pelvis CT 12/05/2021, chest radiograph 07/29/2022 FINDINGS: CT CHEST FINDINGS Cardiovascular: Normal heart size. No pericardial effusion. There are coronary artery calcifications aortic atherosclerosis without acute aortic findings. Mediastinum/Nodes: Shotty mediastinal nodes, including the 12 mm subcarinal lymph node. Patulous distal esophagus. No thyroid nodule. Lungs/Pleura: Multifocal airspace disease. There are tree-in-bud and nodular opacities throughout all lobes of both lungs. More confluent areas of consolidation involving the left lower lobe where there is also septal thickening. No significant pleural effusion. No tracheal or endobronchial debris. Musculoskeletal: There are no acute or  suspicious osseous abnormalities. CT ABDOMEN PELVIS FINDINGS Hepatobiliary: The left lobe of the liver is enlarged. There is no focal hepatic abnormality. Gallbladder physiologically distended, no calcified stone. No biliary dilatation. Pancreas: No ductal dilatation or inflammation. Spleen: Enlarged, 14 cm AP dimension.  No focal splenic abnormality. Adrenals/Urinary Tract: Normal adrenal glands. No hydronephrosis or perinephric edema. Homogeneous renal enhancement with symmetric excretion on delayed phase imaging. There is excreted IV contrast in both renal collecting systems in the urinary bladder. Urinary bladder is nondistended. Stomach/Bowel: No bowel obstruction or inflammation. Moderate volume of colonic stool. Normal appendix visualized. The stomach  is nondistended. Vascular/Lymphatic: Aortic atherosclerosis. No aneurysm. Patent portal vein. No bulky abdominopelvic adenopathy. Reproductive: Uterus and bilateral adnexa are unremarkable. Other: No ascites. No abdominopelvic collection. There multiple subcutaneous densities in the anterior abdominal wall, typical of medication injection sites. Musculoskeletal: There are no acute or suspicious osseous abnormalities. IMPRESSION: 1. Multifocal airspace disease with tree-in-bud and nodular opacities throughout all lobes of both lungs. More confluent areas of consolidation involving the left lower lobe where there is also septal thickening. Findings are most consistent with multifocal pneumonia. 2. No acute abnormality in the abdomen/pelvis. 3. Mild splenomegaly. Aortic Atherosclerosis (ICD10-I70.0). Electronically Signed   By: Narda Rutherford M.D.   On: 08/02/2022 22:02   CT ORBITS W CONTRAST  Result Date: 08/02/2022 CLINICAL DATA:  67 year old female with breast cancer, fever and neutropenia. Right orbital cellulitis. EXAM: CT ORBITS WITH CONTRAST TECHNIQUE: Multidetector CT images was performed according to the standard protocol following intravenous  contrast administration. RADIATION DOSE REDUCTION: This exam was performed according to the departmental dose-optimization program which includes automated exposure control, adjustment of the mA and/or kV according to patient size and/or use of iterative reconstruction technique. CONTRAST:  75mL OMNIPAQUE IOHEXOL 300 MG/ML  SOLN COMPARISON:  Orbit CT 07/12/2022. FINDINGS: Orbits: Right orbits soft tissue swelling and stranding appears resolved since last month. Globes appear symmetric and intact. Periorbital soft tissues now appears symmetric. Postseptal orbits soft tissues now appears symmetric and within normal limits. Visible paranasal sinuses: Paranasal sinus mucosal thickening and occasional sinus opacification is now more symmetric and bilateral. But no discrete sinus fluid levels or bubbly opacity are identified. In the tympanic cavities and mastoids remain well aerated. Soft tissues: Negative visible pharynx, parapharyngeal spaces, retropharyngeal space, masticator and parotid spaces. Retro maxillary soft tissues also appear symmetric and normal. Osseous: No acute or suspicious osseous lesion identified. Limited intracranial: Negative. In the major vascular structures in the visible face and at the skull base are enhancing and appear to be patent. Calcified atherosclerosis at the skull base. IMPRESSION: 1. Essentially resolved right orbital cellulitis since last month. Negative CT appearance of the orbits now. 2. Unresolved paranasal sinus inflammation, now more bilateral and symmetric, but no complicating features are identified. Electronically Signed   By: Odessa Fleming M.D.   On: 08/02/2022 08:32   DG Chest Port 1 View  Result Date: 07/29/2022 CLINICAL DATA:  Weakness EXAM: PORTABLE CHEST 1 VIEW COMPARISON:  X-ray 02/03/2022 FINDINGS: Underinflation. No consolidation, pneumothorax or effusion. Mild peribronchial thickening. No edema. Surgical clips in the left axillary region. IMPRESSION: Underinflation.   Mild peribronchial thickening. Electronically Signed   By: Karen Kays M.D.   On: 07/29/2022 15:08   CT HEAD W & WO CONTRAST ( )  Result Date: 07/13/2022 CLINICAL DATA:  Provided history: Sinusitis, acute, orbital or intracranial complication suspected. Rule out intracranial spread of orbital cellulitis. EXAM: CT HEAD WITHOUT AND WITH CONTRAST TECHNIQUE: Contiguous axial images were obtained from the base of the skull through the vertex without and with intravenous contrast. RADIATION DOSE REDUCTION: This exam was performed according to the departmental dose-optimization program which includes automated exposure control, adjustment of the mA and/or kV according to patient size and/or use of iterative reconstruction technique. CONTRAST:  75mL OMNIPAQUE IOHEXOL 300 MG/ML  SOLN COMPARISON:  Maxillofacial CT 07/09/2022. CT of the orbits 07/12/2022. Head CT 04/11/2017. FINDINGS: Brain: No age advanced or lobar predominant parenchymal atrophy. There is no acute intracranial hemorrhage. No demarcated cortical infarct. No extra-axial fluid collection. No evidence of an intracranial mass. No midline  shift. No pathologic intracranial enhancement identified. Vascular: No hyperdense vessel on pre-contrast imaging. Atherosclerotic calcifications. Enhancement of the proximal large arterial vessels and dural venous sinuses. Skull: No fracture or aggressive osseous lesion. Sinuses/Orbits: Findings compatible with right orbital and periorbital cellulitis, better delineated on the CT of the orbits performed yesterday 07/12/2022. Moderate mucosal thickening within the right frontal sinus. Mild mucosal thickening within the left frontal sinus. Fairly extensive opacification of anterior right ethmoid air cells. Mild mucosal thickening within anterior left ethmoid air cells. Mild mucosal thickening within the right sphenoid sinus. Moderate mucosal thickening within the right maxillary sinus at the imaged levels. IMPRESSION: 1.  No  evidence of an acute intracranial abnormality. 2. Findings compatible with right orbital and periorbital cellulitis, more fully delineated on the CT of the orbits performed yesterday. 3. Paranasal sinus disease as described. Electronically Signed   By: Jackey Loge D.O.   On: 07/13/2022 16:24   CT ORBITS W CONTRAST  Result Date: 07/12/2022 CLINICAL DATA:  Right periorbital cellulitis EXAM: CT ORBITS WITH CONTRAST TECHNIQUE: Multidetector CT images was performed according to the standard protocol following intravenous contrast administration. RADIATION DOSE REDUCTION: This exam was performed according to the departmental dose-optimization program which includes automated exposure control, adjustment of the mA and/or kV according to patient size and/or use of iterative reconstruction technique. CONTRAST:  75mL OMNIPAQUE IOHEXOL 300 MG/ML  SOLN COMPARISON:  None Available. FINDINGS: Orbits: There is a large degree of edema within the right periorbital soft tissues. Additionally, there is mild fat stranding within the medial inferior right orbit. There is no subperiosteal abscess along the lamina papyracea. The left orbit is normal. Visible paranasal sinuses: Right ostiomeatal complex pattern sinusitis Soft tissues: Right periorbital soft tissue swelling as above Osseous: Negative Limited intracranial: Normal IMPRESSION: 1. Right orbital and periorbital cellulitis with mild inflammatory change of the medial extraconal fat and marked right periorbital edema. 2. Right ostiomeatal complex sinusitis is a possible source. Electronically Signed   By: Deatra Robinson M.D.   On: 07/12/2022 22:22   CT Maxillofacial W Contrast  Result Date: 07/09/2022 CLINICAL DATA:  Swelling about the right eye. Headache. Personal history of breast cancer. EXAM: CT MAXILLOFACIAL WITH CONTRAST TECHNIQUE: Multidetector CT imaging of the maxillofacial structures was performed with intravenous contrast. Multiplanar CT image reconstructions  were also generated. RADIATION DOSE REDUCTION: This exam was performed according to the departmental dose-optimization program which includes automated exposure control, adjustment of the mA and/or kV according to patient size and/or use of iterative reconstruction technique. CONTRAST:  75mL OMNIPAQUE IOHEXOL 300 MG/ML  SOLN COMPARISON:  CT head without contrast 04/11/2017 FINDINGS: Osseous: No acute or focal osseous abnormality is present. Orbits: Right periorbital soft tissue swelling is present. Findings are most prominent inferiorly and medially. No discrete abscess is present. The underlying globe is within normal limits. The lateral gland is unremarkable. Left globe and orbit are normal. Sinuses: Chronic wall thickening is present in the maxillary sinuses bilaterally. No acute disease is present. The paranasal sinuses and mastoid air cells are otherwise clear. Soft tissues: Soft tissues of the face and visualize neck are otherwise within normal limits. Limited intracranial: Within normal limits IMPRESSION: 1. Right periorbital soft tissue swelling without discrete abscess. This is consistent with acute cellulitis. 2. Chronic wall thickening in the maxillary sinuses bilaterally without acute disease. Electronically Signed   By: Marin Roberts M.D.   On: 07/09/2022 15:10    I have personally spent 65 minutes involved in face-to-face and non-face-to-face activities for  this patient on the day of the visit. Professional time spent includes the following activities: Preparing to see the patient (review of tests), Obtaining and/or reviewing separately obtained history (admission/discharge record), Performing a medically appropriate examination and/or evaluation , Ordering medications/tests/procedures, referring and communicating with other health care professionals, Documenting clinical information in the EMR, Independently interpreting results (not separately reported), Communicating results to the  patient/family/caregiver, Counseling and educating the patient/family/caregiver and Care coordination (not separately reported).   Plan d/w requesting provider as well as ID pharm D  Note: This document was prepared using dragon voice recognition software and may include unintentional dictation errors.   Electronically signed by:   Odette Fraction, MD Infectious Disease Physician [facility] Pager: [phone]

## 2022-08-05 NOTE — Transfer of Care (Signed)
Immediate Anesthesia Transfer of Care Note  Patient: Amanda Duarte  Procedure(s) Performed: Procedure(s): VIDEO BRONCHOSCOPY WITHOUT FLUORO (N/A) BRONCHIAL WASHINGS  Patient Location: PACU  Anesthesia Type:General  Level of Consciousness: Alert, Awake, Oriented  Airway & Oxygen Therapy: Patient Spontanous Breathing  Post-op Assessment: Report given to RN  Post vital signs: Reviewed and stable  Last Vitals:  Vitals:   08/05/22 0943 08/05/22 1039  BP: (!) 137/50 (!) 150/73  Pulse: (!) 103 (!) 120  Resp: (!) 22 (!) 22  Temp: 36.9 C (!) 35.8 C  SpO2:  95%    Complications: No apparent anesthesia complications

## 2022-08-05 NOTE — Op Note (Signed)
Indication : Bilateral unexplained  infiltrates in this immunocompromised patient with pancytopenia Written informed consent was obtained prior to the procedure. The risks of the procedure including coughing, bleeding and the small chance of lung puncture requiring chest tube were discussed in great detail. The benefits & alternatives including serial follow up were also discussed.  3 bags of platelets were transfused prior to procedure over last 24 hours including 2 bags on the morning of procedure She was noted to have bilateral crackles and albuterol neb given prior to procedure  Procedure was performed under general anesthesia and mechanical ventilation Bronchoscope entered from the ET tube Trachea & bronchial tree examined to the subsegmental level. Copious amount of light green secretions were noted especially in left lower lobe bronchus.  These were suctioned out.  No endobronchial lesions seen. BAL was also obtained from the LLL.  There was minimal bleeding during the procedure.  Specimen sent : BAL for -Cell count -Cytology -Culture and Gram stain -AFB -Fungal culture and stain  Patient will be recovered by anesthesia and endoscopy and then transferred back to her room  [facility] V.  [phone]

## 2022-08-06 ENCOUNTER — Inpatient Hospital Stay (HOSPITAL_COMMUNITY): Payer: Medicare Other

## 2022-08-06 DIAGNOSIS — R008 Other abnormalities of heart beat: Secondary | ICD-10-CM

## 2022-08-06 DIAGNOSIS — D709 Neutropenia, unspecified: Secondary | ICD-10-CM | POA: Diagnosis not present

## 2022-08-06 DIAGNOSIS — J189 Pneumonia, unspecified organism: Secondary | ICD-10-CM | POA: Diagnosis not present

## 2022-08-06 DIAGNOSIS — R5081 Fever presenting with conditions classified elsewhere: Secondary | ICD-10-CM | POA: Diagnosis not present

## 2022-08-06 LAB — GLUCOSE, CAPILLARY
Glucose-Capillary: 334 mg/dL — ABNORMAL HIGH (ref 70–99)
Glucose-Capillary: 309 mg/dL — ABNORMAL HIGH (ref 70–99)
Glucose-Capillary: 216 mg/dL — ABNORMAL HIGH (ref 70–99)
Glucose-Capillary: 132 mg/dL — ABNORMAL HIGH (ref 70–99)
Glucose-Capillary: 106 mg/dL — ABNORMAL HIGH (ref 70–99)

## 2022-08-06 LAB — COMPREHENSIVE METABOLIC PANEL
ALT: 12 U/L (ref 0–44)
AST: 14 U/L — ABNORMAL LOW (ref 15–41)
Albumin: 2.1 g/dL — ABNORMAL LOW (ref 3.5–5.0)
Alkaline Phosphatase: 63 U/L (ref 38–126)
Anion gap: 9 (ref 5–15)
BUN: 22 mg/dL (ref 8–23)
CO2: 19 mmol/L — ABNORMAL LOW (ref 22–32)
Calcium: 7.7 mg/dL — ABNORMAL LOW (ref 8.9–10.3)
Chloride: 105 mmol/L (ref 98–111)
Creatinine, Ser: 0.55 mg/dL (ref 0.44–1.00)
GFR, Estimated: >60 mL/min (ref >60)
Glucose, Bld: 376 mg/dL — ABNORMAL HIGH (ref 70–99)
Potassium: 4.3 mmol/L (ref 3.5–5.1)
Sodium: 133 mmol/L — ABNORMAL LOW (ref 135–145)
Total Bilirubin: 0.9 mg/dL (ref 0.3–1.2)
Total Protein: 6.6 g/dL (ref 6.5–8.1)

## 2022-08-06 LAB — ECHOCARDIOGRAM COMPLETE
AR max vel: 2.08 cm2
AV Area VTI: 2.11 cm2
AV Area mean vel: 2.05 cm2
AV Mean grad: 5 mmHg
AV Peak grad: 8.4 mmHg
Ao pk vel: 1.45 m/s
Area-P 1/2: 3.72 cm2
Calc EF: 52.8 %
Height: 61 in
MV VTI: 3.09 cm2
S' Lateral: 3 cm
Single Plane A2C EF: 52 %
Single Plane A4C EF: 52.7 %
Weight: 1837.75 oz

## 2022-08-06 LAB — MAGNESIUM: Magnesium: 2.2 mg/dL (ref 1.7–2.4)

## 2022-08-06 LAB — BPAM PLATELET PHERESIS
Blood Product Expiration Date: 202407152359
Blood Product Expiration Date: 202407152359
Blood Product Expiration Date: 202407152359
Blood Product Expiration Date: 202407152359
ISSUE DATE / TIME: 202407120650
ISSUE DATE / TIME: 202407121752
Unit Type and Rh: 8400

## 2022-08-06 LAB — CBC WITH DIFFERENTIAL/PLATELET
Abs Immature Granulocytes: 0.08 10*3/uL — ABNORMAL HIGH (ref 0.00–0.07)
Basophils Absolute: 0 10*3/uL (ref 0.0–0.1)
Basophils Relative: 1 %
Eosinophils Absolute: 0 10*3/uL (ref 0.0–0.5)
Eosinophils Relative: 0 %
HCT: 29.6 % — ABNORMAL LOW (ref 36.0–46.0)
Hemoglobin: 10.1 g/dL — ABNORMAL LOW (ref 12.0–15.0)
Immature Granulocytes: 10 %
Lymphocytes Relative: 77 %
Lymphs Abs: 0.3 10*3/uL — ABNORMAL LOW (ref 0.7–4.0)
MCH: 30.1 pg (ref 26.0–34.0)
MCHC: 34.1 g/dL (ref 30.0–36.0)
MCV: 88.4 fL (ref 80.0–100.0)
Monocytes Absolute: 0.3 10*3/uL (ref 0.1–1.0)
Monocytes Relative: 13 %
Neutro Abs: 0.1 10*3/uL — CL (ref 1.7–7.7)
Neutrophils Relative %: 10 %
Platelets: 43 10*3/uL — ABNORMAL LOW (ref 150–400)
RBC: 3.35 MIL/uL — ABNORMAL LOW (ref 3.87–5.11)
RDW: 15 % (ref 11.5–15.5)
WBC: 0.8 10*3/uL — CL (ref 4.0–10.5)
nRBC: 0 % (ref 0.0–0.2)

## 2022-08-06 LAB — PHOSPHORUS: Phosphorus: 2 mg/dL — ABNORMAL LOW (ref 2.5–4.6)

## 2022-08-06 LAB — PREPARE PLATELET PHERESIS
Unit division: 0
Unit division: 0

## 2022-08-06 LAB — AMMONIA: Ammonia: 55 umol/L — ABNORMAL HIGH (ref 9–35)

## 2022-08-06 MED ORDER — INSULIN ASPART 100 UNIT/ML IJ SOLN
0.0000 [IU] | Freq: Every day | INTRAMUSCULAR | Status: DC
  Administered 2022-08-12: 3 [IU] via SUBCUTANEOUS
  Administered 2022-08-13: 4 [IU] via SUBCUTANEOUS

## 2022-08-06 MED ORDER — SODIUM PHOSPHATES 45 MMOLE/15ML IV SOLN
30.0000 mmol | Freq: Once | INTRAVENOUS | Status: AC
  Administered 2022-08-06: 30 mmol via INTRAVENOUS
  Filled 2022-08-06: qty 10

## 2022-08-06 MED ORDER — INSULIN ASPART 100 UNIT/ML IJ SOLN
0.0000 [IU] | Freq: Three times a day (TID) | INTRAMUSCULAR | Status: DC
  Administered 2022-08-06: 7 [IU] via SUBCUTANEOUS
  Administered 2022-08-07: 3 [IU] via SUBCUTANEOUS
  Administered 2022-08-08: 7 [IU] via SUBCUTANEOUS
  Administered 2022-08-08 – 2022-08-09 (×3): 4 [IU] via SUBCUTANEOUS
  Administered 2022-08-10 (×2): 3 [IU] via SUBCUTANEOUS
  Administered 2022-08-10: 7 [IU] via SUBCUTANEOUS
  Administered 2022-08-11 (×2): 4 [IU] via SUBCUTANEOUS
  Administered 2022-08-12: 11 [IU] via SUBCUTANEOUS
  Administered 2022-08-12: 3 [IU] via SUBCUTANEOUS
  Administered 2022-08-13 (×2): 7 [IU] via SUBCUTANEOUS
  Administered 2022-08-14 (×2): 20 [IU] via SUBCUTANEOUS
  Administered 2022-08-14: 4 [IU] via SUBCUTANEOUS
  Administered 2022-08-15: 7 [IU] via SUBCUTANEOUS
  Administered 2022-08-15: 15 [IU] via SUBCUTANEOUS

## 2022-08-06 MED ORDER — INSULIN GLARGINE-YFGN 100 UNIT/ML ~~LOC~~ SOLN
15.0000 [IU] | Freq: Two times a day (BID) | SUBCUTANEOUS | Status: DC
  Administered 2022-08-06 – 2022-08-07 (×4): 15 [IU] via SUBCUTANEOUS
  Filled 2022-08-06 (×5): qty 0.15

## 2022-08-06 NOTE — Progress Notes (Addendum)
ID PROGRESS NOTE  Underwent bronch yesterday morning. Today has remained afebrile. No worsening oxygen need. Labs still reveal neutropenia   For the time being -  Continue with meropenem, micafungin, and vancomycin - will follow bronch cultures - aspergillus and PJP on BAL is pending  Maysen Bonsignore B. Drue Second MD MPH [facility] [phone]

## 2022-08-06 NOTE — Progress Notes (Signed)
PROGRESS NOTE    Amanda Duarte  NWG:[id number] DOB: [birth year] DOA: 07/29/2022 PCP: Claiborne Rigg, NP  Chief Complaint  Patient presents with   Fever    Brief Narrative:   67 y.o. female with medical history significant for treated breast cancer, insulin-dependent type 2 diabetes, myelodysplastic syndrome being admitted to the hospital with neutropenic fever.  She presented to oncology clinic with Dr. Mosetta Putt for routine follow-up, complaint of sore fever but no other symptoms, is currently on azacitadine injection. Today in the oncology clinic she was found to have fever 100.4. Lab work was obtained, showed platelets 14,000 was given 1 unit platelet transfusion in the oncology office and then sent to [facility] ED for evaluation  Chest x-ray with mild peribronchial thickening, UA without evidence of infection. Blood and urine cultures were obtained, she was started empirically on IV vancomycin and IV cefepime.   Assessment & Plan:   Principal Problem:   Neutropenic fever (HCC) Active Problems:   Multifocal pneumonia  Neutropenic fever  Sepsis Due to Multifocal Pneumonia  E. Coli UTI -no fevers overnight  -Recently treated for facial cellulitis - CT orbits was again obtained which shows resolution of periorbital cellulitis -CT chest abdomen pelvis with multifocal airspace disease with tree in bud and nodular opacities throughout all lobes of both lungs - multifocal pneumonia - CXR 7/12 with increasing multifocal pneumonia, possible small L parapneumonic effusion  -Urine culture grew E. coli, pansensitive -ANC 0.1 -appreciate ID assistance - meropenem, vanc, micafungin - will consult ENT with regards to sinusitis (Dr. Pollyann Kennedy recommended waiting for bronch results and can consider further workup if unrevealing from bronch standpoint) -appreciate pulmonary assistance - s/p bronch 7/12 at 10 AM -> copious light green secretions noted especially in LLL bronchus, no endobronchial lesions - specimens  sent for - cell count (70 nucleated cells, 2% neutrophils), cytology pending, culture pending and gram stain (no organisms or WBC's seen), AFB, fungal culture and stain.  Pneumocystis smear, aspergillus Ag, AF smear and culture. - negative strep pneumonia ag, urine legionella negative, histoplasma antigen negative, negative crypto ag, fungitell negative, blastomyces Ag pending - SLP eval - blood cultures NGTD (repeat pending 7/11 am) - appreciate oncology assistance  Acute Hypoxic Respiratory Failure - due to multifocal pneumonia, follow with treatment  Hypotension Transferred to stepdown with developing hypotension Losartan held S/p IVF and pRBC transfusion  Lactic Acidosis - improved after IVF, will monitor - related to infection above  Lethargy  Acute Metabolic Encephalopathy  Elevated Ammonia Level - due to suspected infectious process above, also just had procedure today - TSH, b12, folate wnl, VBG without hypercarbia - ammonia is elevated, unclear significance in absence of cirrhosis - repeat mildly elevated - pending today - will hold lactulose for now  Mild dysplastic syndrome, high-grade Pancytopenia  Severe Thrombocytopenia  Anemia  Neutropenia as Above -Diagnosed in 10/23 -Pulmonary biopsy showed high-grade myeloid neoplasm -Started on azacitidine injection -She was seen at [facility] for bone marrow transplant -Repeat bone marrow biopsy in April 2024 showed persistent high-grade MDS, 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 Sharyne Richters pending per oncology -s/p 5 units of platelets and 2 unit pRBC -Oncology following - transfse for Hb <7.5, platelets < 15,000.  Appreciate Dr. Latanya Maudlin care, she's continuing goals of care conversations with family.     UTI -Urine culture grew E. 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, triple negative -Diagnosed in 4/19 -S/p left mastectomy, neoadjuvant chemotherapy and radiation treatment -Had excellent response -Recent mammogram on 7/23 and right axilla ultrasound on 10/23 were benign -She is on surveillance now, followed by oncology as outpatient    DVT prophylaxis: SCD Code Status: full Family Communication: both daughter 7/12, granddaughter at bedside 7/13 Disposition:   Status is: Inpatient Remains inpatient appropriate because: need for continued inpatient care   Consultants:  ID oncology  Procedures:  none  Antimicrobials:  Anti-infectives (From admission, onward)    Start     Dose/Rate Route Frequency Ordered Stop   08/04/22 1400  meropenem (MERREM) 1 g in sodium chloride 0.9 % 100 mL IVPB        1 g 200 mL/hr over 30 Minutes Intravenous Every 8 hours 08/04/22 0915     08/02/22 1700  metroNIDAZOLE (FLAGYL) tablet 500 mg  Status:  Discontinued        500 mg Oral Every 12 hours 08/02/22 1446 08/04/22 0915   08/02/22 1600  ceFEPIme (MAXIPIME) 2 g in sodium chloride 0.9 % 100 mL IVPB  Status:  Discontinued        2 g 200 mL/hr over 30 Minutes Intravenous Every 12 hours 08/02/22 1506 08/04/22 0915   08/02/22 1600  vancomycin (VANCOREADY) IVPB 750 mg/150 mL        750 mg 150 mL/hr over 60 Minutes Intravenous Every 24 hours 08/02/22 1520     08/02/22 1000  cefTRIAXone (ROCEPHIN) 2 g in sodium chloride 0.9 % 100 mL IVPB  Status:  Discontinued        2 g 200 mL/hr over 30 Minutes Intravenous Every 24 hours 08/01/22 1841 08/02/22 1506   08/01/22 2000  micafungin (MYCAMINE) 100 mg in sodium chloride 0.9 % 100 mL IVPB        100 mg 105 mL/hr over 1 Hours Intravenous Every 24 hours 08/01/22 1835     07/30/22 1800  vancomycin (VANCOREADY) IVPB 750 mg/150 mL  Status:  Discontinued        750 mg 150 mL/hr over  60 Minutes Intravenous Every 24 hours 07/29/22 1825 07/31/22 0924   07/30/22 0500  ceFEPIme (MAXIPIME) 2 g in sodium chloride 0.9 % 100 mL IVPB        2 g 200 mL/hr over 30 Minutes Intravenous Every 12 hours 07/29/22 1825 08/01/22 2130   07/29/22 1630  ceFEPIme (MAXIPIME) 2 g in sodium chloride 0.9 % 100 mL IVPB        2 g 200 mL/hr over 30 Minutes Intravenous  Once 07/29/22 1617 07/29/22 1818   07/29/22 1630  vancomycin (VANCOCIN) IVPB 1000 mg/200 mL premix        1,000 mg 200 mL/hr over 60 Minutes Intravenous  Once 07/29/22 1628 07/30/22 0749       Subjective: Spanish interpreter used Granddaughter at bedside No complaints today (she has never had Delora Gravatt complaint for me despite obvious respiratory symptoms) We discuss her clinical status and I answer questions from her and granddaughter  Objective: Vitals:   08/06/22 0500 08/06/22 0600 08/06/22 0700 08/06/22 0808  BP: (!) 118/51 (!) 115/55 (!) 126/49   Pulse: 73 74 70   Resp: (!) 23 (!) 22 (!) 22   Temp:    98.2 F (36.8 C)  TempSrc:    Oral  SpO2: 97% 98% 95%   Weight:      Height:        Intake/Output Summary (Last 24 hours) at 08/06/2022 0844 Last data filed at 08/06/2022 0346 Gross per 24 hour  Intake 3023.58 ml  Output --  Net 3023.58 ml   Filed Weights   07/29/22 1747 07/29/22 1748 08/05/22 0929  Weight: 55 kg 52.1 kg 52.1 kg    Examination:  General: No acute distress. Cardiovascular:RRR Lungs: mildly tachypneic, increased wob - scattered coarse breath sounds Neurological: Alert and oriented 3. Much more alert today.  Moving all extremities. Skin: Warm and dry. No rashes or lesions. Extremities: No clubbing or cyanosis. No edema.   Data Reviewed: I have personally reviewed following labs and imaging studies  CBC: Recent Labs  Lab 08/02/22 1618 08/03/22 0711 08/04/22 0846 08/05/22 0406 08/05/22 1659 08/06/22 0316  WBC 1.0* 0.7* 0.8* 1.0* 0.8* 0.8*  NEUTROABS 0.2* 0.2* 0.1* 0.1*  --  0.1*  HGB  7.2* 8.2* 7.2* 7.8* 7.2* 10.1*  HCT 21.4* 24.4* 22.3* 23.1* 20.8* 29.6*  MCV 84.6 83.0 85.8 84.9 86.3 88.4  PLT 17* 17* 9* 26* 41* 43*    Basic Metabolic Panel: Recent Labs  Lab 08/01/22 0322 08/03/22 0711 08/04/22 0846 08/05/22 0406 08/06/22 0316  NA 131* 129* 128* 132* 133*  K 3.9 3.4* 3.2* 4.1 4.3  CL 99 96* 99 103 105  CO2 22 19* 20* 21* 19*  GLUCOSE 241* 212* 132* 222* 376*  BUN 15 17 21 15 22   CREATININE 0.57 0.74 0.62 0.59 0.55  CALCIUM 8.6* 8.7* 8.1* 8.0* 7.7*  MG  --   --  1.4* 2.3 2.2  PHOS  --   --  1.8* 2.2* 2.0*    GFR: Estimated Creatinine Clearance: 51.5 mL/min (by C-G formula based on SCr of 0.55 mg/dL).  Liver Function Tests: Recent Labs  Lab 07/31/22 0332 08/01/22 0322 08/04/22 0846 08/05/22 0406 08/06/22 0316  AST 17 20 18 18  14*  ALT 13 13 14 13 12   ALKPHOS 65 72 55 58 63  BILITOT 0.6 0.4 0.9 0.7 0.9  PROT 8.2* 8.1 7.3 7.2 6.6  ALBUMIN 3.2* 3.0* 2.5* 2.3* 2.1*    CBG: Recent Labs  Lab 08/05/22 1134 08/05/22 1649 08/05/22 1759 08/05/22 2216 08/06/22 0735  GLUCAP 101* 132* 163* 256* 334*     Recent Results (from the past 240 hour(s))  Culture, blood (Routine X 2) w Reflex to ID Panel     Status: None   Collection Time: 07/29/22 10:12 AM   Specimen: BLOOD  Result Value Ref Range Status   Specimen Description   Final    BLOOD Performed at [facility], [address], [place], [place] [address]    Special Requests   Final    BLOOD Performed at [facility], [address], [place], [place] [address]    Culture   Final    NO GROWTH 5 DAYS Performed at [facility], [address] [place], [place] [address]    Report Status 08/03/2022 FINAL  Final  Culture, blood (Routine X 2) w Reflex to ID Panel     Status: None   Collection Time: 07/29/22 10:14 AM   Specimen: BLOOD  Result Value Ref Range Status   Specimen Description   Final    BLOOD Performed at [facility], [address], [place], [place] [address]    Special Requests   Final  BLOOD Performed at [facility], [address] [place], [place] [address]    Culture   Final    NO GROWTH 5 DAYS Performed at [facility], [address] [place], [place] [address]    Report Status 08/03/2022 FINAL  Final  Urine Culture     Status: Abnormal   Collection Time: 07/29/22 10:38 AM   Specimen: Urine, Random  Result Value Ref Range Status   Specimen Description   Final    URINE, RANDOM Performed at [facility], [address], [place], [place] [address]    Special Requests   Final    NONE Performed at [facility], [address] [place], [place] [address]    Culture 10,000 COLONIES/mL ESCHERICHIA COLI (Rashee Marschall)  Final   Report Status 07/31/2022 FINAL  Final   Organism ID, Bacteria ESCHERICHIA COLI (Tiannah Greenly)  Final      Susceptibility   Escherichia coli - MIC*    AMPICILLIN <=2 SENSITIVE Sensitive     CEFAZOLIN <=4 SENSITIVE Sensitive     CEFEPIME <=0.12 SENSITIVE Sensitive     CEFTRIAXONE <=0.25 SENSITIVE Sensitive     CIPROFLOXACIN <=0.25 SENSITIVE Sensitive     GENTAMICIN <=1 SENSITIVE Sensitive     IMIPENEM <=0.25 SENSITIVE Sensitive     NITROFURANTOIN <=16 SENSITIVE Sensitive     TRIMETH/SULFA <=20 SENSITIVE Sensitive     AMPICILLIN/SULBACTAM <=2 SENSITIVE Sensitive     PIP/TAZO <=4 SENSITIVE Sensitive     * 10,000 COLONIES/mL ESCHERICHIA COLI  Resp panel by RT-PCR (RSV, Flu Caysen Whang&B, Covid) Anterior Nasal Swab     Status: None   Collection Time: 07/29/22  2:39 PM   Specimen: Anterior Nasal Swab  Result Value Ref Range Status   SARS Coronavirus 2 by RT PCR NEGATIVE NEGATIVE Final    Comment: (NOTE) SARS-CoV-2 target nucleic acids are NOT DETECTED.  The SARS-CoV-2 RNA is generally detectable in upper respiratory specimens during the acute phase of infection. The lowest concentration  of SARS-CoV-2 viral copies this assay can detect is 138 copies/mL. Gabryelle Whitmoyer negative result does not preclude SARS-Cov-2 infection and should not be used as the sole basis for treatment or other patient management decisions. Aaleyah Witherow negative result may occur with  improper specimen collection/handling, submission of specimen other than nasopharyngeal swab, presence of viral mutation(s) within the areas targeted by this assay, and inadequate number of viral copies(<138 copies/mL). Rhonda Linan negative result must be combined with clinical observations, patient history, and epidemiological information. The expected result is Negative.  Fact Sheet for Patients:  BloggerCourse.com  Fact Sheet for Healthcare Providers:  SeriousBroker.it  This test is no t yet approved or cleared by the [place] FDA and  has been authorized for detection and/or diagnosis of SARS-CoV-2 by FDA under an Emergency Use Authorization (EUA). This EUA will remain  in effect (meaning this test can be used) for the duration of the COVID-19 declaration under Section 564(b)(1) of the Act, 21 U.S.C.section 360bbb-3(b)(1), unless the authorization is terminated  or revoked sooner.       Influenza Asheley Hellberg by PCR NEGATIVE NEGATIVE Final   Influenza B by PCR NEGATIVE NEGATIVE Final    Comment: (NOTE) The Xpert Xpress SARS-CoV-2/FLU/RSV plus assay is intended as an aid in the diagnosis of influenza from Nasopharyngeal swab specimens and should not be used as Dante Roudebush sole basis for treatment. Nasal washings and aspirates are unacceptable for Xpert Xpress SARS-CoV-2/FLU/RSV testing.  Fact Sheet for Patients: BloggerCourse.com  Fact Sheet for Healthcare Providers: SeriousBroker.it  This test is not yet approved or cleared by the [place] FDA and has been authorized for detection and/or diagnosis of SARS-CoV-2 by FDA under an Emergency Use  Authorization (EUA). This EUA will remain in effect (meaning this test can be used) for the duration of the COVID-19 declaration under Section 564(b)(1) of the Act, 21 U.S.C. section 360bbb-3(b)(1), unless the authorization is terminated or revoked.     Resp Syncytial Virus by PCR NEGATIVE NEGATIVE Final    Comment: (NOTE) Fact Sheet for Patients: BloggerCourse.com  Fact Sheet for Healthcare Providers: SeriousBroker.it  This test is not yet approved or cleared by the [place] FDA and has been authorized for detection and/or diagnosis of SARS-CoV-2 by FDA under an Emergency Use Authorization (EUA). This EUA will remain in effect (meaning this test can be used) for the duration of the COVID-19 declaration under Section 564(b)(1) of the Act, 21 U.S.C. section 360bbb-3(b)(1), unless the authorization is terminated or revoked.  Performed at [facility], [address] [place], [place] [address]   Group Ella Golomb Strep by PCR     Status: None   Collection Time: 07/29/22  2:40 PM   Specimen: Throat; Sterile Swab  Result Value Ref Range Status   Group Lendy Dittrich Strep by PCR NOT DETECTED NOT DETECTED Final    Comment: Performed at [facility], [address] [place], [place] [address]  Culture, blood (Routine X 2) w Reflex to ID Panel     Status: None (Preliminary result)   Collection Time: 08/04/22  5:34 AM   Specimen: BLOOD  Result Value Ref Range Status   Specimen Description   Final    BLOOD BLOOD RIGHT HAND Performed at [facility] [address], [place], [place] [address]    Special Requests   Final    BOTTLES DRAWN AEROBIC ONLY Blood Culture adequate volume Performed at [facility], [address], [place], [place] [address]    Culture   Final    NO GROWTH 2 DAYS Performed at [facility], [address], [place], [place] [address]    Report  Status PENDING  Incomplete  Culture, blood (Routine X 2) w Reflex to ID Panel     Status: None (Preliminary result)   Collection Time: 08/04/22  5:34 AM   Specimen: BLOOD  Result Value Ref Range Status   Specimen Description   Final    BLOOD RIGHT WRIST Performed at [facility], [address] [place], [place] [address]    Special Requests   Final    BOTTLES DRAWN AEROBIC ONLY Blood Culture adequate volume Performed at [facility], [address], [place], [place] [address]    Culture   Final    NO GROWTH 2 DAYS Performed at [facility], [address], [place], [place] [address]    Report Status PENDING  Incomplete  Expectorated Sputum Assessment w Gram Stain, Rflx to Resp Cult     Status: None   Collection Time: 08/04/22  7:44 PM   Specimen: Expectorated Sputum  Result Value Ref Range Status   Specimen Description EXPECTORATED SPUTUM  Final   Special Requests NONE  Final   Sputum evaluation   Final    Sputum specimen not acceptable for testing.  Please recollect.   NOTIFIED JEREMY RN Performed at [facility], [address], [place], [place] [address]  Report Status 08/04/2022 FINAL  Final  Body fluid culture w Gram Stain     Status: None (Preliminary result)   Collection Time: 08/05/22 10:23 AM   Specimen: PATH Cytology Bronchial lavage; Body Fluid  Result Value Ref Range Status   Specimen Description   Final    BRONCHIAL ALVEOLAR LAVAGE L Performed at [facility], [address] [place], [place] [address]    Special Requests   Final    NONE Performed at [facility], [address], [place], [place] [address]    Gram Stain   Final    NO WBC SEEN NO ORGANISMS SEEN Performed at [facility], [address], [place], [place] [address]    Culture PENDING  Incomplete   Report Status PENDING  Incomplete  MRSA Next Gen by PCR, Nasal     Status: None    Collection Time: 08/05/22  7:07 PM   Specimen: Nasal Mucosa; Nasal Swab  Result Value Ref Range Status   MRSA by PCR Next Gen NOT DETECTED NOT DETECTED Final    Comment: (NOTE) The GeneXpert MRSA Assay (FDA approved for NASAL specimens only), is one component of Audris Speaker comprehensive MRSA colonization surveillance program. It is not intended to diagnose MRSA infection nor to guide or monitor treatment for MRSA infections. Test performance is not FDA approved in patients less than 67 years old. Performed at [facility], [address] [place], [place] [address]          Radiology Studies: DG CHEST PORT 1 VIEW  Result Date: 08/05/2022 CLINICAL DATA:  [id number] Multifocal pneumonia [id number]. EXAM: PORTABLE CHEST 1 VIEW COMPARISON:  07/05/2022. FINDINGS: Increasing hazy opacities in the left mid and lower lung, as well as the right upper lobe, consistent with multifocal pneumonia. Stable cardiac and mediastinal contours. Possible small left parapneumonic effusion. No pneumothorax. IMPRESSION: Increasing multifocal pneumonia, left greater than right. Possible small left parapneumonic effusion. Electronically Signed   By: Orvan Falconer M.D.   On: 08/05/2022 15:21        Scheduled Meds:  sodium chloride   Intravenous Once   sodium chloride   Intravenous Once   Chlorhexidine Gluconate Cloth  6 each Topical Daily   citalopram  20 mg Oral Daily   insulin aspart  0-15 Units Subcutaneous TID WC   insulin aspart  0-5 Units Subcutaneous QHS   insulin aspart  5 Units Subcutaneous TID WC   insulin glargine-yfgn  15 Units Subcutaneous BID   mouth rinse  15 mL Mouth Rinse 4 times per day   pantoprazole  40 mg Oral QAC breakfast   Continuous Infusions:  lactated ringers 75 mL/hr at 08/05/22 1802   meropenem (MERREM) IV Stopped (08/06/22 0755)   micafungin (MYCAMINE) 100 mg in sodium chloride 0.9 % 100 mL IVPB Stopped (08/06/22 0319)   vancomycin Stopped (08/05/22 1837)      LOS: 8 days    Time spent: 40 min critical care time    Lacretia Nicks, MD [facility]   To contact the attending provider between 7A-7P or the covering provider during after hours 7P-7A, please log into the web site [email or web address] and access using universal [place] password for that web site. If you do not have the password, please call the hospital operator.  08/06/2022, 8:44 AM

## 2022-08-06 NOTE — Progress Notes (Signed)
NAME:  Amanda Duarte, MRN:  [id number], DOB:  [birth year], LOS: 8 ADMISSION DATE:  07/29/2022, CONSULTATION DATE:  08/04/2022 REFERRING MD:  Dr. Lowell Guitar - [facility], CHIEF COMPLAINT: Multifocal pneumonia  History of Present Illness:  Amanda Duarte is a 67 year old female with past medical history significant for left breast cancer, type 2 diabetes, myelodysplastic syndrome HTN, HLD, and prior stroke who initially presented to the emergency department from oncology office in 7/5 with fever.  On presentation to oncology office patient was seen to have a temperature of 100.4 with thrombocytopenia.  He is admitted per hospitalist service for management of possible underlying infection, started empirically on IV vancomycin and cefepime.  Mid morning of 7/11 PCCM consulted for worsening fever with Tmax 103.2 in the setting of multifocal pneumonia and E. coli UTI.  PCCM consulted for assistance in management.  Pertinent  Medical History   left breast cancer, type 2 diabetes, myelodysplastic syndrome HTN, HLD, and prior stroke  Significant Hospital Events: Including procedures, antibiotic start and stop dates in addition to other pertinent events   7/05 during outpatient oncology follow-up patient found to be febrile and thrombocytopenic started on empiric antibiotics UA negative for bacteria, positive protein, negative nitrates and leukocytes.  Urine culture positive for 10,000 colonies of pansensitive E. coli 7/9 ID consulted and recommended advanced imaging CT chest/abdomen/pelvis with signs of multifocal pneumonia 7/11 or worsening fever with Tmax 103.2, PCCM consulted for assistance in bronchoscopy  Interim History / Subjective:   Last fever 1-2.5 documented at 11 AM, defervesced since then. Transferred to stepdown unit for monitoring. On 2 L nasal cannula, no dyspnea or chest pain Denies any problems swallowing  Objective   Blood pressure (!) 142/62, pulse 84, temperature 98.2 F (36.8 C), temperature  source Oral, resp. rate (!) 30, height 5\' 1"  (1.549 m), weight 52.1 kg, SpO2 96%.        Intake/Output Summary (Last 24 hours) at 08/06/2022 0935 Last data filed at 08/06/2022 0346 Gross per 24 hour  Intake 2713.58 ml  Output --  Net 2713.58 ml   Filed Weights   07/29/22 1747 07/29/22 1748 08/05/22 0929  Weight: 55 kg 52.1 kg 52.1 kg    Examination: General: Elderly woman lying in bed, no distress on nasal cannula HENT: Pallor, no icterus, no JVD Lungs: Clear lungs today, no accessory muscle use Cardiovascular: S1-S2 reg Abdomen: Soft, nontender, no organomegaly Extremities: No deformity, no edema Neuro:  alert, interactive, nonfocal when aroused   Labs show  hyponatremia, hypophosphatemia, mag normal  neutropenia with ANC 100, platelets improved to 46 K posttransfusion  Resolved Hospital Problem list     Assessment & Plan:  Multifocal pneumonia Neutropenic fever Recently treated for periorbital cellulitis She had previous bronchoscopy 11/2021 when she required mechanical ventilation, Aspergillus antigen was negative then P: Await BAL data, additional  tests added by ID Some bile like staining of secretions raises question of aspiration but she denies dysphagia Antibiotics per ID, previously she had prolonged QT but empiric voriconazole was used  Pancytopenia -Status post 3 units platelets 7/12  PCCM will follow peripherally, plan is for ENT evaluation if BAL nondiagnostic    Best Practice (right click and "Reselect all SmartList Selections" daily)  Per primary  Labs   CBC: Recent Labs  Lab 08/02/22 1618 08/03/22 0711 08/04/22 0846 08/05/22 0406 08/05/22 1659 08/06/22 0316  WBC 1.0* 0.7* 0.8* 1.0* 0.8* 0.8*  NEUTROABS 0.2* 0.2* 0.1* 0.1*  --  0.1*  HGB 7.2* 8.2* 7.2* 7.8*  7.2* 10.1*  HCT 21.4* 24.4* 22.3* 23.1* 20.8* 29.6*  MCV 84.6 83.0 85.8 84.9 86.3 88.4  PLT 17* 17* 9* 26* 41* 43*    Basic Metabolic Panel: Recent Labs  Lab 08/01/22 0322  08/03/22 0711 08/04/22 0846 08/05/22 0406 08/06/22 0316  NA 131* 129* 128* 132* 133*  K 3.9 3.4* 3.2* 4.1 4.3  CL 99 96* 99 103 105  CO2 22 19* 20* 21* 19*  GLUCOSE 241* 212* 132* 222* 376*  BUN 15 17 21 15 22   CREATININE 0.57 0.74 0.62 0.59 0.55  CALCIUM 8.6* 8.7* 8.1* 8.0* 7.7*  MG  --   --  1.4* 2.3 2.2  PHOS  --   --  1.8* 2.2* 2.0*   GFR: Estimated Creatinine Clearance: 51.5 mL/min (by C-G formula based on SCr of 0.55 mg/dL). Recent Labs  Lab 08/04/22 0534 08/04/22 0846 08/04/22 2229 08/05/22 0406 08/05/22 0523 08/05/22 1659 08/06/22 0316  WBC  --  0.8*  --  1.0*  --  0.8* 0.8*  LATICACIDVEN 1.7  --  3.7* 3.0* 2.4*  --   --     Liver Function Tests: Recent Labs  Lab 07/31/22 0332 08/01/22 0322 08/04/22 0846 08/05/22 0406 08/06/22 0316  AST 17 20 18 18  14*  ALT 13 13 14 13 12   ALKPHOS 65 72 55 58 63  BILITOT 0.6 0.4 0.9 0.7 0.9  PROT 8.2* 8.1 7.3 7.2 6.6  ALBUMIN 3.2* 3.0* 2.5* 2.3* 2.1*   No results for input(s): "LIPASE", "AMYLASE" in the last 168 hours.  Recent Labs  Lab 08/04/22 1309 08/05/22 0406 08/06/22 0817  AMMONIA 57* 41* 55*    ABG    Component Value Date/Time   PHART 7.39 12/07/2021 1130   PCO2ART 36 12/07/2021 1130   PO2ART 101 12/07/2021 1130   HCO3 22.3 08/04/2022 1309   TCO2 30 05/31/2017 1113   ACIDBASEDEF 0.4 08/04/2022 1309   O2SAT 93.4 08/04/2022 1309     Coagulation Profile: No results for input(s): "INR", "PROTIME" in the last 168 hours.  Cardiac Enzymes: No results for input(s): "CKTOTAL", "CKMB", "CKMBINDEX", "TROPONINI" in the last 168 hours.  HbA1C: Hemoglobin A1C  Date/Time Value Ref Range Status  05/16/2022 09:42 AM 10.0 (A) 4.0 - 5.6 % Final   HbA1c, POC (controlled diabetic range)  Date/Time Value Ref Range Status  10/28/2021 12:07 PM 11.4 (A) 0.0 - 7.0 % Final  06/30/2020 04:02 PM 8.8 (A) 0.0 - 7.0 % Final   Hgb A1c MFr Bld  Date/Time Value Ref Range Status  07/10/2022 09:48 AM 11.9 (H) 4.8 - 5.6  % Final    Comment:    (NOTE) Pre diabetes:          5.7%-6.4%  Diabetes:              >6.4%  Glycemic control for   <7.0% adults with diabetes   01/31/2022 10:15 AM 10.7 (H) 4.8 - 5.6 % Final    Comment:             Prediabetes: 5.7 - 6.4          Diabetes: >6.4          Glycemic control for adults with diabetes: <7.0     CBG: Recent Labs  Lab 08/05/22 1134 08/05/22 1649 08/05/22 1759 08/05/22 2216 08/06/22 0735  GLUCAP 101* 132* 163* 256* 334*     Cyril Mourning MD. FCCP. [facility] Pager : [phone]  If no response to pager ,  please call [phone] until 7 pm After 7:00 pm call Elink  [phone]   08/06/2022

## 2022-08-06 NOTE — Progress Notes (Signed)
Amanda Duarte   DOB:10/21/1955   WU#:[id number]   GNF#:[id number]   Heme-onc follow-up note  Subjective: Patient is clinically stable, still on nasal cannula oxygen, vital signs are stable, she has mild tachypnea especially when she talks, denies pain or other new symptoms.  Afebrile since this morning.  Her daughter Amanda Duarte is at the bedside.  Objective:  Vitals:   08/06/22 1201 08/06/22 1300  BP:  (!) 161/68  Pulse:  80  Resp:  (!) 23  Temp: 98.3 F (36.8 C)   SpO2:  100%    Body mass index is 21.7 kg/m.  Intake/Output Summary (Last 24 hours) at 08/06/2022 1518 Last data filed at 08/06/2022 1453 Gross per 24 hour  Intake 2328.4 ml  Output --  Net 2328.4 ml     Sclerae unicteric, (+) edema and skin erythema around the right eye  Oropharynx clear  No peripheral adenopathy  Lungs clear -- no rales or rhonchi  Heart regular rate and rhythm  Abdomen benign  MSK no focal spinal tenderness, no peripheral edema  Neuro nonfocal    CBG (last 3)  Recent Labs    08/05/22 2216 08/06/22 0735 08/06/22 1154  GLUCAP 256* 334* 309*     Labs:   Urine Studies No results for input(s): "UHGB", "CRYS" in the last 72 hours.  Invalid input(s): "UACOL", "UAPR", "USPG", "UPH", "UTP", "UGL", "UKET", "UBIL", "UNIT", "UROB", "ULEU", "UEPI", "UWBC", "URBC", "UBAC", "CAST", "UCOM", "BILUA"  Basic Metabolic Panel: Recent Labs  Lab 08/01/22 0322 08/03/22 0711 08/04/22 0846 08/05/22 0406 08/06/22 0316  NA 131* 129* 128* 132* 133*  K 3.9 3.4* 3.2* 4.1 4.3  CL 99 96* 99 103 105  CO2 22 19* 20* 21* 19*  GLUCOSE 241* 212* 132* 222* 376*  BUN 15 17 21 15 22   CREATININE 0.57 0.74 0.62 0.59 0.55  CALCIUM 8.6* 8.7* 8.1* 8.0* 7.7*  MG  --   --  1.4* 2.3 2.2  PHOS  --   --  1.8* 2.2* 2.0*   GFR Estimated Creatinine Clearance: 51.5 mL/min (by C-G formula based on SCr of 0.55 mg/dL). Liver Function Tests: Recent Labs  Lab 07/31/22 0332 08/01/22 0322 08/04/22 0846 08/05/22 0406 08/06/22 0316   AST 17 20 18 18  14*  ALT 13 13 14 13 12   ALKPHOS 65 72 55 58 63  BILITOT 0.6 0.4 0.9 0.7 0.9  PROT 8.2* 8.1 7.3 7.2 6.6  ALBUMIN 3.2* 3.0* 2.5* 2.3* 2.1*   No results for input(s): "LIPASE", "AMYLASE" in the last 168 hours.  Recent Labs  Lab 08/04/22 1309 08/05/22 0406 08/06/22 0817  AMMONIA 57* 41* 55*   Coagulation profile No results for input(s): "INR", "PROTIME" in the last 168 hours.  CBC: Recent Labs  Lab 08/02/22 1618 08/03/22 0711 08/04/22 0846 08/05/22 0406 08/05/22 1659 08/06/22 0316  WBC 1.0* 0.7* 0.8* 1.0* 0.8* 0.8*  NEUTROABS 0.2* 0.2* 0.1* 0.1*  --  0.1*  HGB 7.2* 8.2* 7.2* 7.8* 7.2* 10.1*  HCT 21.4* 24.4* 22.3* 23.1* 20.8* 29.6*  MCV 84.6 83.0 85.8 84.9 86.3 88.4  PLT 17* 17* 9* 26* 41* 43*   Cardiac Enzymes: No results for input(s): "CKTOTAL", "CKMB", "CKMBINDEX", "TROPONINI" in the last 168 hours. BNP: Invalid input(s): "POCBNP" CBG: Recent Labs  Lab 08/05/22 1649 08/05/22 1759 08/05/22 2216 08/06/22 0735 08/06/22 1154  GLUCAP 132* 163* 256* 334* 309*   D-Dimer No results for input(s): "DDIMER" in the last 72 hours. Hgb A1c No results for input(s): "HGBA1C" in the last 72  hours.  Lipid Profile No results for input(s): "CHOL", "HDL", "LDLCALC", "TRIG", "CHOLHDL", "LDLDIRECT" in the last 72 hours. Thyroid function studies Recent Labs    08/05/22 0406  TSH 1.756   Anemia work up Recent Labs    08/05/22 0406  VITAMINB12 1,048*  FOLATE 12.4   Microbiology Recent Results (from the past 240 hour(s))  Culture, blood (Routine X 2) w Reflex to ID Panel     Status: None   Collection Time: 07/29/22 10:12 AM   Specimen: BLOOD  Result Value Ref Range Status   Specimen Description   Final    BLOOD Performed at [facility], [address], [place], [place] [address]    Special Requests   Final    BLOOD Performed at [facility], [address] [place], [place] [address]    Culture    Final    NO GROWTH 5 DAYS Performed at [facility], [address] [place], [place] [address]    Report Status 08/03/2022 FINAL  Final  Culture, blood (Routine X 2) w Reflex to ID Panel     Status: None   Collection Time: 07/29/22 10:14 AM   Specimen: BLOOD  Result Value Ref Range Status   Specimen Description   Final    BLOOD Performed at [facility], [address], [place], [place] [address]    Special Requests   Final    BLOOD Performed at [facility], [address] [place], [place] [address]    Culture   Final    NO GROWTH 5 DAYS Performed at [facility], [address], [place], [place] [address]    Report Status 08/03/2022 FINAL  Final  Urine Culture     Status: Abnormal   Collection Time: 07/29/22 10:38 AM   Specimen: Urine, Random  Result Value Ref Range Status   Specimen Description   Final    URINE, RANDOM Performed at [facility], [address], [place], [place] [address]    Special Requests   Final    NONE Performed at [facility], [address], [place], [place] [address]    Culture 10,000 COLONIES/mL ESCHERICHIA COLI (A)  Final   Report Status 07/31/2022 FINAL  Final   Organism ID, Bacteria ESCHERICHIA COLI (A)  Final      Susceptibility   Escherichia coli - MIC*    AMPICILLIN <=2 SENSITIVE Sensitive     CEFAZOLIN <=4 SENSITIVE Sensitive     CEFEPIME <=0.12 SENSITIVE Sensitive     CEFTRIAXONE <=0.25 SENSITIVE Sensitive     CIPROFLOXACIN <=0.25 SENSITIVE Sensitive     GENTAMICIN <=1 SENSITIVE Sensitive     IMIPENEM <=0.25 SENSITIVE Sensitive     NITROFURANTOIN <=16 SENSITIVE Sensitive     TRIMETH/SULFA <=20 SENSITIVE Sensitive     AMPICILLIN/SULBACTAM <=2 SENSITIVE Sensitive     PIP/TAZO <=4 SENSITIVE Sensitive     * 10,000 COLONIES/mL ESCHERICHIA COLI  Resp panel by RT-PCR (RSV, Flu A&B, Covid) Anterior Nasal Swab     Status:  None   Collection Time: 07/29/22  2:39 PM   Specimen: Anterior Nasal Swab  Result Value Ref Range Status   SARS Coronavirus 2 by RT PCR NEGATIVE NEGATIVE Final    Comment: (NOTE) SARS-CoV-2 target nucleic acids are NOT DETECTED.  The SARS-CoV-2 RNA is generally detectable in upper respiratory specimens during the acute phase of infection. The lowest  concentration of SARS-CoV-2 viral copies this assay can detect is 138 copies/mL. A negative result does not preclude SARS-Cov-2 infection and should not be used as the sole basis for treatment or other patient management decisions. A negative result may occur with  improper specimen collection/handling, submission of specimen other than nasopharyngeal swab, presence of viral mutation(s) within the areas targeted by this assay, and inadequate number of viral copies(<138 copies/mL). A negative result must be combined with clinical observations, patient history, and epidemiological information. The expected result is Negative.  Fact Sheet for Patients:  BloggerCourse.com  Fact Sheet for Healthcare Providers:  SeriousBroker.it  This test is no t yet approved or cleared by the [place] FDA and  has been authorized for detection and/or diagnosis of SARS-CoV-2 by FDA under an Emergency Use Authorization (EUA). This EUA will remain  in effect (meaning this test can be used) for the duration of the COVID-19 declaration under Section 564(b)(1) of the Act, 21 U.S.C.section 360bbb-3(b)(1), unless the authorization is terminated  or revoked sooner.       Influenza A by PCR NEGATIVE NEGATIVE Final   Influenza B by PCR NEGATIVE NEGATIVE Final    Comment: (NOTE) The Xpert Xpress SARS-CoV-2/FLU/RSV plus assay is intended as an aid in the diagnosis of influenza from Nasopharyngeal swab specimens and should not be used as a sole basis for treatment. Nasal washings and aspirates are unacceptable  for Xpert Xpress SARS-CoV-2/FLU/RSV testing.  Fact Sheet for Patients: BloggerCourse.com  Fact Sheet for Healthcare Providers: SeriousBroker.it  This test is not yet approved or cleared by the [place] FDA and has been authorized for detection and/or diagnosis of SARS-CoV-2 by FDA under an Emergency Use Authorization (EUA). This EUA will remain in effect (meaning this test can be used) for the duration of the COVID-19 declaration under Section 564(b)(1) of the Act, 21 U.S.C. section 360bbb-3(b)(1), unless the authorization is terminated or revoked.     Resp Syncytial Virus by PCR NEGATIVE NEGATIVE Final    Comment: (NOTE) Fact Sheet for Patients: BloggerCourse.com  Fact Sheet for Healthcare Providers: SeriousBroker.it  This test is not yet approved or cleared by the [place] FDA and has been authorized for detection and/or diagnosis of SARS-CoV-2 by FDA under an Emergency Use Authorization (EUA). This EUA will remain in effect (meaning this test can be used) for the duration of the COVID-19 declaration under Section 564(b)(1) of the Act, 21 U.S.C. section 360bbb-3(b)(1), unless the authorization is terminated or revoked.  Performed at [facility], [address], [place], [place] [address]   Group A Strep by PCR     Status: None   Collection Time: 07/29/22  2:40 PM   Specimen: Throat; Sterile Swab  Result Value Ref Range Status   Group A Strep by PCR NOT DETECTED NOT DETECTED Final    Comment: Performed at [facility], [address], [place], [place] [address]  Culture, blood (Routine X 2) w Reflex to ID Panel     Status: None (Preliminary result)   Collection Time: 08/04/22  5:34 AM   Specimen: BLOOD  Result Value Ref Range Status   Specimen Description   Final    BLOOD BLOOD RIGHT HAND Performed at [facility], [address], [place], [place] [address]    Special Requests   Final    BOTTLES DRAWN AEROBIC ONLY Blood Culture adequate volume Performed at [facility], [address] [place], [place] [address]  Culture   Final    NO GROWTH 2 DAYS Performed at [facility], [address], [place], [place] [address]    Report Status PENDING  Incomplete  Culture, blood (Routine X 2) w Reflex to ID Panel     Status: None (Preliminary result)   Collection Time: 08/04/22  5:34 AM   Specimen: BLOOD  Result Value Ref Range Status   Specimen Description   Final    BLOOD RIGHT WRIST Performed at [facility], [address] [place], [place] [address]    Special Requests   Final    BOTTLES DRAWN AEROBIC ONLY Blood Culture adequate volume Performed at [facility], [address] [place], [place] [address]    Culture   Final    NO GROWTH 2 DAYS Performed at [facility], [address], [place], [place] [address]    Report Status PENDING  Incomplete  Expectorated Sputum Assessment w Gram Stain, Rflx to Resp Cult     Status: None   Collection Time: 08/04/22  7:44 PM   Specimen: Expectorated Sputum  Result Value Ref Range Status   Specimen Description EXPECTORATED SPUTUM  Final   Special Requests NONE  Final   Sputum evaluation   Final    Sputum specimen not acceptable for testing.  Please recollect.   NOTIFIED JEREMY RN Performed at [facility], [address] [place], [place] [address]    Report Status 08/04/2022 FINAL  Final  Body fluid culture w Gram Stain     Status: None (Preliminary result)   Collection Time: 08/05/22 10:23 AM   Specimen: PATH Cytology Bronchial lavage; Body Fluid  Result Value Ref Range Status   Specimen Description   Final    BRONCHIAL ALVEOLAR LAVAGE L Performed at [facility], [address] [place], [place] [address]    Special Requests    Final    NONE Performed at [facility], [address], [place], [place] [address]    Gram Stain NO WBC SEEN NO ORGANISMS SEEN   Final   Culture   Final    NO GROWTH < 24 HOURS Performed at [facility], [address], [place], [place] [address]    Report Status PENDING  Incomplete  MRSA Next Gen by PCR, Nasal     Status: None   Collection Time: 08/05/22  7:07 PM   Specimen: Nasal Mucosa; Nasal Swab  Result Value Ref Range Status   MRSA by PCR Next Gen NOT DETECTED NOT DETECTED Final    Comment: (NOTE) The GeneXpert MRSA Assay (FDA approved for NASAL specimens only), is one component of a comprehensive MRSA colonization surveillance program. It is not intended to diagnose MRSA infection nor to guide or monitor treatment for MRSA infections. Test performance is not FDA approved in patients less than 61 years old. Performed at [facility], [address], [place], [place] [address]       Studies:  ECHOCARDIOGRAM COMPLETE  Result Date: 08/06/2022    ECHOCARDIOGRAM REPORT   Patient Name:   JONETTE GUST Date of Exam: 08/06/2022 Medical Rec #:  [id number]   Height:       61.0 in Accession #:    [id number]  Weight:       114.9 lb Date of Birth:  [birth year]    BSA:          1.492 m Patient Age:  67 years    BP:           142/62 mmHg Patient Gender: F           HR:           79 bpm. Exam Location:  Inpatient Procedure: 2D Echo, Cardiac Doppler and Color Doppler Indications:    Other abnormalities of the heart  History:        Patient has prior history of Echocardiogram examinations, most                 recent 12/07/2021. Cancer and Stroke, Signs/Symptoms:Chest Pain                 and Fever; Risk Factors:Hypertension, Diabetes, Dyslipidemia and                 cellulitis.  Sonographer:    Wallie Char Referring Phys: [phone] A CALDWELL POWELL JR IMPRESSIONS  1. Left ventricular ejection fraction, by estimation, is 50 to 55%. The left ventricle  has low normal function. The left ventricle has no regional wall motion abnormalities. Left ventricular diastolic parameters were normal.  2. Right ventricular systolic function is normal. The right ventricular size is normal.  3. Left atrial size was mildly dilated.  4. Right atrial size was mildly dilated.  5. The mitral valve is normal in structure. Mild mitral valve regurgitation. No evidence of mitral stenosis.  6. The aortic valve is normal in structure. Aortic valve regurgitation is not visualized. No aortic stenosis is present.  7. The inferior vena cava is normal in size with greater than 50% respiratory variability, suggesting right atrial pressure of 3 mmHg. FINDINGS  Left Ventricle: Left ventricular ejection fraction, by estimation, is 50 to 55%. The left ventricle has low normal function. The left ventricle has no regional wall motion abnormalities. The left ventricular internal cavity size was normal in size. There is no left ventricular hypertrophy. Left ventricular diastolic parameters were normal. Right Ventricle: The right ventricular size is normal. No increase in right ventricular wall thickness. Right ventricular systolic function is normal. Left Atrium: Left atrial size was mildly dilated. Right Atrium: Right atrial size was mildly dilated. Pericardium: There is no evidence of pericardial effusion. Mitral Valve: The mitral valve is normal in structure. Mild mitral valve regurgitation. No evidence of mitral valve stenosis. MV peak gradient, 4.2 mmHg. The mean mitral valve gradient is 2.0 mmHg. Tricuspid Valve: The tricuspid valve is normal in structure. Tricuspid valve regurgitation is mild . No evidence of tricuspid stenosis. Aortic Valve: The aortic valve is normal in structure. Aortic valve regurgitation is not visualized. No aortic stenosis is present. Aortic valve mean gradient measures 5.0 mmHg. Aortic valve peak gradient measures 8.4 mmHg. Aortic valve area, by VTI measures 2.11 cm.  Pulmonic Valve: The pulmonic valve was normal in structure. Pulmonic valve regurgitation is trivial. No evidence of pulmonic stenosis. Aorta: The aortic root is normal in size and structure. Venous: The inferior vena cava is normal in size with greater than 50% respiratory variability, suggesting right atrial pressure of 3 mmHg. IAS/Shunts: No atrial level shunt detected by color flow Doppler.  LEFT VENTRICLE PLAX 2D LVIDd:         4.20 cm     Diastology LVIDs:         3.00 cm     LV e' medial:    9.36 cm/s LV PW:         0.90 cm     LV  E/e' medial:  11.1 LV IVS:        1.00 cm     LV e' lateral:   12.90 cm/s LVOT diam:     1.90 cm     LV E/e' lateral: 8.1 LV SV:         71 LV SV Index:   48 LVOT Area:     2.84 cm  LV Volumes (MOD) LV vol d, MOD A2C: 87.7 ml LV vol d, MOD A4C: 71.0 ml LV vol s, MOD A2C: 42.1 ml LV vol s, MOD A4C: 33.6 ml LV SV MOD A2C:     45.6 ml LV SV MOD A4C:     71.0 ml LV SV MOD BP:      43.0 ml RIGHT VENTRICLE             IVC RV Basal diam:  3.50 cm     IVC diam: 2.30 cm RV S prime:     12.40 cm/s TAPSE (M-mode): 2.6 cm LEFT ATRIUM             Index        RIGHT ATRIUM           Index LA diam:        3.60 cm 2.41 cm/m   RA Area:     15.10 cm LA Vol (A2C):   48.9 ml 32.77 ml/m  RA Volume:   36.20 ml  24.26 ml/m LA Vol (A4C):   46.5 ml 31.16 ml/m LA Biplane Vol: 48.0 ml 32.17 ml/m  AORTIC VALVE AV Area (Vmax):    2.08 cm AV Area (Vmean):   2.05 cm AV Area (VTI):     2.11 cm AV Vmax:           144.50 cm/s AV Vmean:          104.000 cm/s AV VTI:            0.336 m AV Peak Grad:      8.4 mmHg AV Mean Grad:      5.0 mmHg LVOT Vmax:         106.00 cm/s LVOT Vmean:        75.250 cm/s LVOT VTI:          0.250 m LVOT/AV VTI ratio: 0.74  AORTA Ao Root diam: 2.60 cm Ao Asc diam:  3.20 cm MITRAL VALVE                TRICUSPID VALVE MV Area (PHT): 3.72 cm     TR Peak grad:   33.4 mmHg MV Area VTI:   3.09 cm     TR Mean grad:   18.0 mmHg MV Peak grad:  4.2 mmHg     TR Vmax:        289.00 cm/s MV  Mean grad:  2.0 mmHg     TR Vmean:       204.0 cm/s MV Vmax:       1.03 m/s MV Vmean:      66.4 cm/s    SHUNTS MV Decel Time: 204 msec     Systemic VTI:  0.25 m MV E velocity: 104.00 cm/s  Systemic Diam: 1.90 cm MV A velocity: 73.80 cm/s MV E/A ratio:  1.41 Arvilla Meres MD Electronically signed by Arvilla Meres MD Signature Date/Time: 08/06/2022/11:05:50 AM    Final    DG CHEST PORT 1 VIEW  Result Date: 08/05/2022 CLINICAL DATA:  [id number] Multifocal pneumonia [id number]. EXAM: PORTABLE CHEST 1 VIEW  COMPARISON:  07/05/2022. FINDINGS: Increasing hazy opacities in the left mid and lower lung, as well as the right upper lobe, consistent with multifocal pneumonia. Stable cardiac and mediastinal contours. Possible small left parapneumonic effusion. No pneumothorax. IMPRESSION: Increasing multifocal pneumonia, left greater than right. Possible small left parapneumonic effusion. Electronically Signed   By: Orvan Falconer M.D.   On: 08/05/2022 15:21    Assessment: 67 y.o. 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  She remains to be very hopeful that she will recover well -I again discussed with patient and her daughter Amanda Duarte that the possibility of need intubation and life support if her pneumonia gets worse.  Due to her chronic severe neutropenia, progression of her MDS, no good second line treatment options, overall prognosis is poor.  CODE STATUS has been discussed with patient and her family multiple times, she still wishes to be full code at this time, which is still reasonable. I asked her how long she wants to be on life support if she did need intubation, she said that she will defer that to  her daughter Roney Marion who is her power of attorney.  -Infection and supportive care per primary team and ID, and pulmonary, I appreciate their excellent care  -I will f/u every a few days   Malachy Mood, MD 08/06/2022

## 2022-08-06 NOTE — Evaluation (Signed)
Clinical/Bedside Swallow Evaluation Patient Details  Name: Amanda Duarte MRN: [id number] Date of Birth: 06/22/1955  Today's Date: 08/06/2022 Time: SLP Start Time (ACUTE ONLY): 1502 SLP Stop Time (ACUTE ONLY): 1524 SLP Time Calculation (min) (ACUTE ONLY): 22 min  Past Medical History:  Past Medical History:  Diagnosis Date   Cancer (HCC) 03/2017   left breast cancer   Diabetes mellitus without complication (HCC)    Hyperlipidemia    Hypertension    Malignant neoplasm of left breast (HCC)    Stroke (cerebrum) (HCC)    Tibial plateau fracture, left    04-13-17 had ORIF   Past Surgical History:  Past Surgical History:  Procedure Laterality Date   MASTECTOMY Left 2019   MASTECTOMY MODIFIED RADICAL Left 11/20/2017   Procedure: LEFT MODIFIED RADICAL MASTECTOMY;  Surgeon: Claud Kelp, MD;  Location: [facility] OR;  Service: General;  Laterality: Left;   ORIF TIBIA PLATEAU Left 04/13/2017   Procedure: OPEN REDUCTION INTERNAL FIXATION (ORIF) TIBIAL PLATEAU;  Surgeon: Roby Lofts, MD;  Location: [facility] OR;  Service: Orthopedics;  Laterality: Left;   PORT-A-CATH REMOVAL Right 11/20/2017   Procedure: REMOVAL PORT-A-CATH;  Surgeon: Claud Kelp, MD;  Location: [facility] OR;  Service: General;  Laterality: Right;   PORTACATH PLACEMENT Right 05/31/2017   Procedure: INSERTION PORT-A-CATH;  Surgeon: Claud Kelp, MD;  Location: [facility];  Service: General;  Laterality: Right;   HPI:  Patient is a 67 year old female admitted to [facility] long hospital with fever, patient had respiratory failure s/p bronch by CCM.  Per notes ENT is to follow-up for her sinusitis .  She is currently on oxygen nasal cannula.  She was found to be very neutropenic and urine grew E. coli which was pansensitive but patient continued to spike fever.  She has been treated with antibiotics. Per MD notes "Recently treated for facial cellulitis - CT orbits was again obtained which shows resolution of periorbital cellulitis  -CT  chest abdomen pelvis with multifocal airspace disease with tree in bud and nodular opacities throughout all lobes of both lungs - multifocal pneumonia  - CXR 7/12 with increasing multifocal pneumonia, possible small L parapneumonic effusion."     Past medical history includes breast cancer 2019 s/p chemoradiation, type 2 insulin-dependent diabetes myelodysplastic syndrome, sinusitis.  White blood cell count low, platelets at 14,000, pt has received treatment.  Chest x-ray showed multifocal pneumonia left more than right.  Patient familiar SLP but could not locate prior notes.  She reports she eats regular foods at home and denies dysphagia.  Does state that she had 1 incident where she coughed and spit up food that was the same color as what she consumed.    Assessment / Plan / Recommendation  Clinical Impression  Patient presents with functional oropharyngeal swallow based on clinical evaluation.  She easily passed the 3 ounce he will water challenge and was able to consume Jell-O and graham crackers without delays and swallow nor indication of pharyngeal retention.  Patient's respiratory rate remained stable throughout entire session.  She denies issues with swallowing and denies becoming short of breath with p.o. intake.  RN states patient tolerating p.o. recommend continue diet as tolerated.  Educated patient to importance to ensure she is not short of breath with intake due to negative impact on reciprocity of swallowing and breathing.  Given history of breast cancer with radiation recommend to monitor for potential esophageal issues in the future.  No SLP follow-up indicated.  Thank so much for this consult.  iPad interpreter Coralie Common [phone] utilized during the entire session. SLP Visit Diagnosis: Dysphagia, unspecified (R13.10)    Aspiration Risk  Mild aspiration risk    Diet Recommendation Regular;Thin liquid    Liquid Administration via: Cup;Straw Medication Administration: Whole meds with  liquid Supervision: Patient able to self feed Compensations: Slow rate;Small sips/bites Postural Changes: Seated upright at 90 degrees;Remain upright for at least 30 minutes after po intake    Other  Recommendations      Recommendations for follow up therapy are one component of a multi-disciplinary discharge planning process, led by the attending physician.  Recommendations may be updated based on patient status, additional functional criteria and insurance authorization.  Follow up Recommendations No SLP follow up      Assistance Recommended at Discharge    Functional Status Assessment Patient has not had a recent decline in their functional status  Frequency and Duration   N/a         Prognosis     N/a   Swallow Study   General Date of Onset: 08/06/22 HPI: Patient is a 67 year old female admitted to [facility] long hospital with fever, patient had respiratory failure s/p bronch by CCM.  Per notes ENT is to follow-up for her sinusitis .  She is currently on oxygen nasal cannula.  She was found to be very neutropenic and urine grew E. coli which was pansensitive but patient continued to spike fever.  She has been treated with antibiotics. Per MD notes "Recently treated for facial cellulitis - CT orbits was again obtained which shows resolution of periorbital cellulitis  -CT chest abdomen pelvis with multifocal airspace disease with tree in bud and nodular opacities throughout all lobes of both lungs - multifocal pneumonia  - CXR 7/12 with increasing multifocal pneumonia, possible small L parapneumonic effusion."     Past medical history includes breast cancer 2019 s/p chemoradiation, type 2 insulin-dependent diabetes myelodysplastic syndrome, sinusitis.  White blood cell count low, platelets at 14,000, pt has received treatment.  Chest x-ray showed multifocal pneumonia left more than right.  Patient familiar SLP but could not locate prior notes.  She reports she eats regular foods at home and  denies dysphagia.  Does state that she had 1 incident where she coughed and spit up food that was the same color as what she consumed. Type of Study: Bedside Swallow Evaluation Diet Prior to this Study: Regular Temperature Spikes Noted: No Respiratory Status: Nasal cannula History of Recent Intubation: No Behavior/Cognition: Alert;Cooperative;Pleasant mood Oral Cavity Assessment: Within Functional Limits Oral Care Completed by SLP: No Oral Cavity - Dentition: Edentulous Vision: Functional for self-feeding Self-Feeding Abilities: Able to feed self Patient Positioning: Upright in bed Baseline Vocal Quality: Normal Volitional Cough: Strong Volitional Swallow: Able to elicit    Oral/Motor/Sensory Function Overall Oral Motor/Sensory Function: Within functional limits   Ice Chips Ice chips: Not tested   Thin Liquid Thin Liquid: Within functional limits Presentation: Self Fed;Straw    Nectar Thick Nectar Thick Liquid: Not tested   Honey Thick Honey Thick Liquid: Not tested   Puree Puree: Within functional limits Presentation: Self Fed;Spoon   Solid     Solid: Within functional limits Presentation: Self Orvan July 08/06/2022,4:06 PM  Rolena Infante, MS [facility] SLP Acute Rehab Services Office [phone]

## 2022-08-06 NOTE — Progress Notes (Signed)
Unable to get urine sample due to the patient having diarrhea (on lactulose). The patient's urine has been mixed with diarrhea.

## 2022-08-07 ENCOUNTER — Inpatient Hospital Stay (HOSPITAL_COMMUNITY): Payer: Medicare Other

## 2022-08-07 DIAGNOSIS — D709 Neutropenia, unspecified: Secondary | ICD-10-CM | POA: Diagnosis not present

## 2022-08-07 DIAGNOSIS — R5081 Fever presenting with conditions classified elsewhere: Secondary | ICD-10-CM | POA: Diagnosis not present

## 2022-08-07 LAB — GLUCOSE, CAPILLARY
Glucose-Capillary: 92 mg/dL (ref 70–99)
Glucose-Capillary: 128 mg/dL — ABNORMAL HIGH (ref 70–99)
Glucose-Capillary: 77 mg/dL (ref 70–99)
Glucose-Capillary: 181 mg/dL — ABNORMAL HIGH (ref 70–99)

## 2022-08-07 LAB — COMPREHENSIVE METABOLIC PANEL
ALT: 11 U/L (ref 0–44)
AST: 14 U/L — ABNORMAL LOW (ref 15–41)
Albumin: 2.1 g/dL — ABNORMAL LOW (ref 3.5–5.0)
Alkaline Phosphatase: 61 U/L (ref 38–126)
Anion gap: 9 (ref 5–15)
BUN: 19 mg/dL (ref 8–23)
CO2: 23 mmol/L (ref 22–32)
Calcium: 8 mg/dL — ABNORMAL LOW (ref 8.9–10.3)
Chloride: 104 mmol/L (ref 98–111)
Creatinine, Ser: 0.41 mg/dL — ABNORMAL LOW (ref 0.44–1.00)
GFR, Estimated: >60 mL/min (ref >60)
Glucose, Bld: 109 mg/dL — ABNORMAL HIGH (ref 70–99)
Potassium: 4 mmol/L (ref 3.5–5.1)
Sodium: 136 mmol/L (ref 135–145)
Total Bilirubin: 0.7 mg/dL (ref 0.3–1.2)
Total Protein: 6.9 g/dL (ref 6.5–8.1)

## 2022-08-07 LAB — CBC WITH DIFFERENTIAL/PLATELET
Abs Immature Granulocytes: 0.04 10*3/uL (ref 0.00–0.07)
Basophils Absolute: 0 10*3/uL (ref 0.0–0.1)
Basophils Relative: 1 %
Eosinophils Absolute: 0 10*3/uL (ref 0.0–0.5)
Eosinophils Relative: 0 %
HCT: 28.1 % — ABNORMAL LOW (ref 36.0–46.0)
Hemoglobin: 9.7 g/dL — ABNORMAL LOW (ref 12.0–15.0)
Immature Granulocytes: 3 %
Lymphocytes Relative: 70 %
Lymphs Abs: 0.9 10*3/uL (ref 0.7–4.0)
MCH: 31.2 pg (ref 26.0–34.0)
MCHC: 34.5 g/dL (ref 30.0–36.0)
MCV: 90.4 fL (ref 80.0–100.0)
Monocytes Absolute: 0.2 10*3/uL (ref 0.1–1.0)
Monocytes Relative: 18 %
Neutro Abs: 0.1 10*3/uL — CL (ref 1.7–7.7)
Neutrophils Relative %: 8 %
Platelets: 32 10*3/uL — ABNORMAL LOW (ref 150–400)
RBC: 3.11 MIL/uL — ABNORMAL LOW (ref 3.87–5.11)
RDW: 15.2 % (ref 11.5–15.5)
WBC: 1.3 10*3/uL — CL (ref 4.0–10.5)
nRBC: 2.3 % — ABNORMAL HIGH (ref 0.0–0.2)

## 2022-08-07 LAB — PNEUMOCYSTIS JIROVECI SMEAR BY DFA: Pneumocystis jiroveci Ag: NEGATIVE

## 2022-08-07 LAB — MAGNESIUM: Magnesium: 1.8 mg/dL (ref 1.7–2.4)

## 2022-08-07 LAB — AMMONIA: Ammonia: 44 umol/L — ABNORMAL HIGH (ref 9–35)

## 2022-08-07 LAB — SODIUM, URINE, RANDOM: Sodium, Ur: 177 mmol/L

## 2022-08-07 LAB — PHOSPHORUS: Phosphorus: 3.1 mg/dL (ref 2.5–4.6)

## 2022-08-07 MED ORDER — FUROSEMIDE 10 MG/ML IJ SOLN
20.0000 mg | Freq: Every day | INTRAMUSCULAR | Status: DC
  Administered 2022-08-07 – 2022-08-09 (×3): 20 mg via INTRAVENOUS
  Filled 2022-08-07 (×5): qty 2

## 2022-08-07 NOTE — Progress Notes (Signed)
PROGRESS NOTE    Amanda Duarte  NUU:[id number] DOB: 11/11/1955 DOA: 07/29/2022 PCP: Claiborne Rigg, NP  Chief Complaint  Patient presents with   Fever    Brief Narrative:   67 y.o. female with medical history significant for treated breast cancer, insulin-dependent type 2 diabetes, myelodysplastic syndrome being admitted to the hospital with neutropenic fever.  She presented to oncology clinic with Dr. Mosetta Putt for routine follow-up, complaint of sore fever but no other symptoms, is currently on azacitadine injection. Today in the oncology clinic she was found to have fever 100.4. Lab work was obtained, showed platelets 14,000 was given 1 unit platelet transfusion in the oncology office and then sent to [facility] ED for evaluation  Chest x-ray with mild peribronchial thickening, UA without evidence of infection. Blood and urine cultures were obtained, she was started empirically on IV vancomycin and IV cefepime.   Assessment & Plan:   Principal Problem:   Neutropenic fever (HCC) Active Problems:   Multifocal pneumonia  Goals of Care - appreciate Dr. Latanya Maudlin discussion with family.  She's full code at this time.  Neutropenic fever  Sepsis Due to Multifocal Pneumonia  E. Coli UTI -no fevers overnight (overall improvement since bronch) -Recently treated for facial cellulitis - CT orbits was again obtained which shows resolution of periorbital cellulitis -CT chest abdomen pelvis with multifocal airspace disease with tree in bud and nodular opacities throughout all lobes of both lungs - multifocal pneumonia - CXR 7/12 with increasing multifocal pneumonia, possible small L parapneumonic effusion  - repeat CXR 7/14, mild hemoptysis noted per discussion with RN (suspect due to pna) -Urine culture grew E. coli, pansensitive -ANC 0.1 -appreciate ID assistance - meropenem, vanc, micafungin - will consult ENT with regards to sinusitis (Dr. Pollyann Kennedy recommended waiting for bronch results and can consider  further workup if unrevealing from bronch standpoint) -appreciate pulmonary assistance - s/p bronch 7/12 at 10 AM -> copious light green secretions noted especially in LLL bronchus, no endobronchial lesions - specimens sent for - cell count (70 nucleated cells, 2% neutrophils), cytology pending, culture no growth <24 hrs and gram stain (no organisms or WBC's seen), AFB, fungal culture and stain.  Pneumocystis smear, aspergillus Ag, AF smear and culture. - negative strep pneumonia ag, urine legionella negative, histoplasma antigen negative, negative crypto ag, fungitell negative, blastomyces Ag pending - SLP eval - blood cultures NGTD (repeat pending 7/11 am) - appreciate oncology assistance  Acute Hypoxic Respiratory Failure - due to multifocal pneumonia, follow with treatment - net positive 12 L, will start some gentle lasix as tolerated  Hypotension resolved  Hypertension Losartan currently on hold, BP's being taken on leg, unclear how accurate these are - she's asymptomatic Follow with lasix   Lactic Acidosis - improved after IVF, will monitor - related to infection above  Lethargy  Acute Metabolic Encephalopathy  Elevated Ammonia Level - due to suspected infectious process above, also just had procedure today - TSH, b12, folate wnl, VBG without hypercarbia - ammonia is elevated, unclear significance in absence of cirrhosis - her mental status has improved - will hold off on any further lactulose for now   Mild dysplastic syndrome, high-grade Pancytopenia  Severe Thrombocytopenia  Anemia  Neutropenia as Above -Diagnosed in 10/23 -Pulmonary biopsy showed high-grade myeloid neoplasm -Started on azacitidine injection -She was seen at [facility] for bone marrow transplant -Repeat bone marrow biopsy in April 2024 showed persistent high-grade MDS, 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 Sharyne Richters pending  per oncology -s/p 5 units of platelets and 2 unit  pRBC -Oncology following - transfse for Hb <7.5, platelets < 15,000.  Appreciate Dr. Latanya Maudlin care, she's continuing goals of care conversations with family - currently full code.     UTI -Urine culture grew E. 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, triple negative -Diagnosed in 4/19 -S/p left mastectomy, neoadjuvant chemotherapy and radiation treatment -Had excellent response -Recent mammogram on 7/23 and right axilla ultrasound on 10/23 were benign -She is on surveillance now, followed by oncology as outpatient    DVT prophylaxis: SCD Code Status: full Family Communication: both daughter 7/12, granddaughter at bedside 7/13 Disposition:   Status is: Inpatient Remains inpatient appropriate because: need for continued inpatient care   Consultants:  ID oncology  Procedures:  none  Antimicrobials:  Anti-infectives (From admission, onward)    Start     Dose/Rate Route Frequency Ordered Stop   08/04/22 1400  meropenem (MERREM) 1 g in sodium chloride 0.9 % 100 mL IVPB        1 g 200 mL/hr over 30 Minutes Intravenous Every 8 hours 08/04/22 0915     08/02/22 1700  metroNIDAZOLE (FLAGYL) tablet 500 mg  Status:  Discontinued        500 mg Oral Every 12 hours 08/02/22 1446 08/04/22 0915   08/02/22 1600  ceFEPIme (MAXIPIME) 2 g in sodium chloride 0.9 % 100 mL IVPB  Status:  Discontinued        2 g 200 mL/hr over 30 Minutes Intravenous Every 12 hours 08/02/22 1506 08/04/22 0915   08/02/22 1600  vancomycin (VANCOREADY) IVPB 750 mg/150 mL        750 mg 150 mL/hr over 60 Minutes Intravenous Every 24 hours 08/02/22 1520     08/02/22 1000  cefTRIAXone (ROCEPHIN) 2 g in sodium chloride 0.9 % 100 mL IVPB  Status:  Discontinued        2  g 200 mL/hr over 30 Minutes Intravenous Every 24 hours 08/01/22 1841 08/02/22 1506   08/01/22 2000  micafungin (MYCAMINE) 100 mg in sodium chloride 0.9 % 100 mL IVPB        100 mg 105 mL/hr over 1 Hours Intravenous Every 24 hours 08/01/22 1835     07/30/22 1800  vancomycin (VANCOREADY) IVPB 750 mg/150 mL  Status:  Discontinued        750 mg 150 mL/hr over 60 Minutes Intravenous Every 24 hours 07/29/22 1825 07/31/22 0924   07/30/22 0500  ceFEPIme (MAXIPIME) 2 g in sodium chloride 0.9 % 100 mL IVPB        2 g 200 mL/hr over 30 Minutes Intravenous Every 12 hours 07/29/22 1825 08/01/22 2130   07/29/22 1630  ceFEPIme (MAXIPIME) 2 g in sodium chloride 0.9 % 100 mL IVPB        2 g 200 mL/hr over 30 Minutes Intravenous  Once 07/29/22 1617 07/29/22 1818   07/29/22 1630  vancomycin (VANCOCIN) IVPB 1000 mg/200 mL premix        1,000 mg 200 mL/hr over 60 Minutes Intravenous  Once 07/29/22 1628 07/30/22 0749       Subjective: Spanish interpreter used Again, no complaints (despite tachypnea noted on exam) She denies CP, SOB, headache --- she notes she  feels like her normal self  Objective: Vitals:   08/07/22 0430 08/07/22 0500 08/07/22 0600 08/07/22 0750  BP:   (!) 138/50   Pulse:  82 83   Resp:  (!) 25 (!) 31   Temp: 98.9 F (37.2 C)   99.1 F (37.3 C)  TempSrc: Oral   Oral  SpO2:  96% 97%   Weight:      Height:        Intake/Output Summary (Last 24 hours) at 08/07/2022 0816 Last data filed at 08/07/2022 0733 Gross per 24 hour  Intake 870.08 ml  Output 250 ml  Net 620.08 ml   Filed Weights   07/29/22 1747 07/29/22 1748 08/05/22 0929  Weight: 55 kg 52.1 kg 52.1 kg    Examination:  General: No acute distress. Cardiovascular: RRR Lungs: tachypneic, on 3 L  Abdomen: Soft, nontender, nondistended  Neurological: Alert and oriented 3. Moves all extremities 4 with equal strength. Cranial nerves II through XII grossly intact. Extremities: trace dependent edema  Data Reviewed:  I have personally reviewed following labs and imaging studies  CBC: Recent Labs  Lab 08/03/22 0711 08/04/22 0846 08/05/22 0406 08/05/22 1659 08/06/22 0316 08/07/22 0257  WBC 0.7* 0.8* 1.0* 0.8* 0.8* 1.3*  NEUTROABS 0.2* 0.1* 0.1*  --  0.1* 0.1*  HGB 8.2* 7.2* 7.8* 7.2* 10.1* 9.7*  HCT 24.4* 22.3* 23.1* 20.8* 29.6* 28.1*  MCV 83.0 85.8 84.9 86.3 88.4 90.4  PLT 17* 9* 26* 41* 43* 32*    Basic Metabolic Panel: Recent Labs  Lab 08/03/22 0711 08/04/22 0846 08/05/22 0406 08/06/22 0316 08/07/22 0257  NA 129* 128* 132* 133* 136  K 3.4* 3.2* 4.1 4.3 4.0  CL 96* 99 103 105 104  CO2 19* 20* 21* 19* 23  GLUCOSE 212* 132* 222* 376* 109*  BUN 17 21 15 22 19   CREATININE 0.74 0.62 0.59 0.55 0.41*  CALCIUM 8.7* 8.1* 8.0* 7.7* 8.0*  MG  --  1.4* 2.3 2.2 1.8  PHOS  --  1.8* 2.2* 2.0* 3.1    GFR: Estimated Creatinine Clearance: 51.5 mL/min (Bryor Rami) (by C-G formula based on SCr of 0.41 mg/dL (L)).  Liver Function Tests: Recent Labs  Lab 08/01/22 0322 08/04/22 0846 08/05/22 0406 08/06/22 0316 08/07/22 0257  AST 20 18 18  14* 14*  ALT 13 14 13 12 11   ALKPHOS 72 55 58 63 61  BILITOT 0.4 0.9 0.7 0.9 0.7  PROT 8.1 7.3 7.2 6.6 6.9  ALBUMIN 3.0* 2.5* 2.3* 2.1* 2.1*    CBG: Recent Labs  Lab 08/06/22 1154 08/06/22 1651 08/06/22 2034 08/06/22 2149 08/07/22 0803  GLUCAP 309* 216* 132* 106* 92     Recent Results (from the past 240 hour(s))  Culture, blood (Routine X 2) w Reflex to ID Panel     Status: None   Collection Time: 07/29/22 10:12 AM   Specimen: BLOOD  Result Value Ref Range Status   Specimen Description   Final    BLOOD Performed at [facility], [address], [place], [place] [address]    Special Requests   Final    BLOOD Performed at [facility], [address] [place] [address], [place], [place] [address]    Culture   Final    NO GROWTH 5 DAYS Performed at [facility], [address], [place], [place] [address]     Report Status 08/03/2022 FINAL  Final  Culture, blood (Routine X 2) w Reflex to ID Panel  Status: None   Collection Time: 07/29/22 10:14 AM   Specimen: BLOOD  Result Value Ref Range Status   Specimen Description   Final    BLOOD Performed at [facility], [address] [place], [place] [address]    Special Requests   Final    BLOOD Performed at [facility], [address], [place], [place] [address]    Culture   Final    NO GROWTH 5 DAYS Performed at [facility], [address], [place], [place] [address]    Report Status 08/03/2022 FINAL  Final  Urine Culture     Status: Abnormal   Collection Time: 07/29/22 10:38 AM   Specimen: Urine, Random  Result Value Ref Range Status   Specimen Description   Final    URINE, RANDOM Performed at [facility], [address], [place], [place] [address]    Special Requests   Final    NONE Performed at [facility], [address] [place], [place] [address]    Culture 10,000 COLONIES/mL ESCHERICHIA COLI (Treg Diemer)  Final   Report Status 07/31/2022 FINAL  Final   Organism ID, Bacteria ESCHERICHIA COLI (Cortni Tays)  Final      Susceptibility   Escherichia coli - MIC*    AMPICILLIN <=2 SENSITIVE Sensitive     CEFAZOLIN <=4 SENSITIVE Sensitive     CEFEPIME <=0.12 SENSITIVE Sensitive     CEFTRIAXONE <=0.25 SENSITIVE Sensitive     CIPROFLOXACIN <=0.25 SENSITIVE Sensitive     GENTAMICIN <=1 SENSITIVE Sensitive     IMIPENEM <=0.25 SENSITIVE Sensitive     NITROFURANTOIN <=16 SENSITIVE Sensitive     TRIMETH/SULFA <=20 SENSITIVE Sensitive     AMPICILLIN/SULBACTAM <=2 SENSITIVE Sensitive     PIP/TAZO <=4 SENSITIVE Sensitive     * 10,000 COLONIES/mL ESCHERICHIA COLI  Resp panel by RT-PCR (RSV, Flu Khamille Beynon&B, Covid) Anterior Nasal Swab     Status: None   Collection Time: 07/29/22  2:39 PM   Specimen: Anterior Nasal Swab  Result Value Ref Range Status    SARS Coronavirus 2 by RT PCR NEGATIVE NEGATIVE Final    Comment: (NOTE) SARS-CoV-2 target nucleic acids are NOT DETECTED.  The SARS-CoV-2 RNA is generally detectable in upper respiratory specimens during the acute phase of infection. The lowest concentration of SARS-CoV-2 viral copies this assay can detect is 138 copies/mL. Sayre Witherington negative result does not preclude SARS-Cov-2 infection and should not be used as the sole basis for treatment or other patient management decisions. Lashunta Frieden negative result may occur with  improper specimen collection/handling, submission of specimen other than nasopharyngeal swab, presence of viral mutation(s) within the areas targeted by this assay, and inadequate number of viral copies(<138 copies/mL). Shianne Zeiser negative result must be combined with clinical observations, patient history, and epidemiological information. The expected result is Negative.  Fact Sheet for Patients:  BloggerCourse.com  Fact Sheet for Healthcare Providers:  SeriousBroker.it  This test is no t yet approved or cleared by the [place] FDA and  has been authorized for detection and/or diagnosis of SARS-CoV-2 by FDA under an Emergency Use Authorization (EUA). This EUA will remain  in effect (meaning this test can be used) for the duration of the COVID-19 declaration under Section 564(b)(1) of the Act, 21 U.S.C.section 360bbb-3(b)(1), unless the authorization is terminated  or revoked sooner.       Influenza Katee Wentland by PCR NEGATIVE NEGATIVE Final   Influenza B by PCR NEGATIVE  NEGATIVE Final    Comment: (NOTE) The Xpert Xpress SARS-CoV-2/FLU/RSV plus assay is intended as an aid in the diagnosis of influenza from Nasopharyngeal swab specimens and should not be used as Maddelynn Moosman sole basis for treatment. Nasal washings and aspirates are unacceptable for Xpert Xpress SARS-CoV-2/FLU/RSV testing.  Fact Sheet for  Patients: BloggerCourse.com  Fact Sheet for Healthcare Providers: SeriousBroker.it  This test is not yet approved or cleared by the [place] FDA and has been authorized for detection and/or diagnosis of SARS-CoV-2 by FDA under an Emergency Use Authorization (EUA). This EUA will remain in effect (meaning this test can be used) for the duration of the COVID-19 declaration under Section 564(b)(1) of the Act, 21 U.S.C. section 360bbb-3(b)(1), unless the authorization is terminated or revoked.     Resp Syncytial Virus by PCR NEGATIVE NEGATIVE Final    Comment: (NOTE) Fact Sheet for Patients: BloggerCourse.com  Fact Sheet for Healthcare Providers: SeriousBroker.it  This test is not yet approved or cleared by the [place] FDA and has been authorized for detection and/or diagnosis of SARS-CoV-2 by FDA under an Emergency Use Authorization (EUA). This EUA will remain in effect (meaning this test can be used) for the duration of the COVID-19 declaration under Section 564(b)(1) of the Act, 21 U.S.C. section 360bbb-3(b)(1), unless the authorization is terminated or revoked.  Performed at [facility], [address], [place], [place] [address]   Group Catlin Aycock Strep by PCR     Status: None   Collection Time: 07/29/22  2:40 PM   Specimen: Throat; Sterile Swab  Result Value Ref Range Status   Group Telly Broberg Strep by PCR NOT DETECTED NOT DETECTED Final    Comment: Performed at [facility], [address], [place], [place] [address]  Culture, blood (Routine X 2) w Reflex to ID Panel     Status: None (Preliminary result)   Collection Time: 08/04/22  5:34 AM   Specimen: BLOOD  Result Value Ref Range Status   Specimen Description   Final    BLOOD BLOOD RIGHT HAND Performed at [facility], [address], [place], [place] [address]     Special Requests   Final    BOTTLES DRAWN AEROBIC ONLY Blood Culture adequate volume Performed at [facility], [address] [place], [place] [address]    Culture   Final    NO GROWTH 2 DAYS Performed at [facility], [address], [place], [place] [address]    Report Status PENDING  Incomplete  Culture, blood (Routine X 2) w Reflex to ID Panel     Status: None (Preliminary result)   Collection Time: 08/04/22  5:34 AM   Specimen: BLOOD  Result Value Ref Range Status   Specimen Description   Final    BLOOD RIGHT WRIST Performed at [facility], [address], [place], [place] [address]    Special Requests   Final    BOTTLES DRAWN AEROBIC ONLY Blood Culture adequate volume Performed at [facility], [address], [place], [place] [address]    Culture   Final    NO GROWTH 2 DAYS Performed at [facility], [address], [place], [place] [address]    Report Status PENDING  Incomplete  Expectorated Sputum Assessment w Gram Stain, Rflx to Resp Cult     Status: None   Collection Time: 08/04/22  7:44 PM   Specimen: Expectorated Sputum  Result Value Ref  Range Status   Specimen Description EXPECTORATED SPUTUM  Final   Special Requests NONE  Final   Sputum evaluation   Final    Sputum specimen not acceptable for testing.  Please recollect.   NOTIFIED JEREMY RN Performed at [facility], [address] [place], [place] [address]    Report Status 08/04/2022 FINAL  Final  Body fluid culture w Gram Stain     Status: None (Preliminary result)   Collection Time: 08/05/22 10:23 AM   Specimen: PATH Cytology Bronchial lavage; Body Fluid  Result Value Ref Range Status   Specimen Description   Final    BRONCHIAL ALVEOLAR LAVAGE L Performed at [facility], [address] [place], [place] [address]    Special Requests   Final    NONE Performed at [facility], [address] [place], [place] [address]    Gram Stain NO WBC SEEN NO ORGANISMS SEEN   Final   Culture   Final    NO GROWTH < 24 HOURS Performed at [facility], [address] [place], [place] [address]    Report Status PENDING  Incomplete  MRSA Next Gen by PCR, Nasal     Status: None   Collection Time: 08/05/22  7:07 PM   Specimen: Nasal Mucosa; Nasal Swab  Result Value Ref Range Status   MRSA by PCR Next Gen NOT DETECTED NOT DETECTED Final    Comment: (NOTE) The GeneXpert MRSA Assay (FDA approved for NASAL specimens only), is one component of Johneric Mcfadden comprehensive MRSA colonization surveillance program. It is not intended to diagnose MRSA infection nor to guide or monitor treatment for MRSA infections. Test performance is not FDA approved in patients less than 72 years old. Performed at [facility], [address] [place], [place] [address]          Radiology Studies: ECHOCARDIOGRAM COMPLETE  Result Date: 08/06/2022    ECHOCARDIOGRAM REPORT   Patient Name:   LUVERNE HUTZEL Date of Exam: 08/06/2022 Medical Rec #:  [id number]   Height:       61.0 in Accession #:    [id number]  Weight:       114.9 lb Date of Birth:  06-Nov-1955    BSA:          1.492 m Patient Age:    67 years    BP:           142/62 mmHg Patient Gender: F           HR:           79 bpm. Exam Location:  Inpatient Procedure: 2D Echo, Cardiac Doppler and Color Doppler Indications:    Other abnormalities of the heart  History:        Patient has prior history of Echocardiogram examinations, most                 recent 12/07/2021. Cancer and Stroke, Signs/Symptoms:Chest Pain                 and Fever; Risk Factors:Hypertension, Diabetes, Dyslipidemia and                 cellulitis.  Sonographer:    Wallie Char Referring Phys: [phone] Malcomb Gangemi CALDWELL POWELL JR IMPRESSIONS  1. Left ventricular ejection fraction, by estimation, is 50 to 55%. The left ventricle has low normal function. The left  ventricle has no regional wall motion abnormalities. Left ventricular diastolic  parameters were normal.  2. Right ventricular systolic function is normal. The right ventricular size is normal.  3. Left atrial size was mildly dilated.  4. Right atrial size was mildly dilated.  5. The mitral valve is normal in structure. Mild mitral valve regurgitation. No evidence of mitral stenosis.  6. The aortic valve is normal in structure. Aortic valve regurgitation is not visualized. No aortic stenosis is present.  7. The inferior vena cava is normal in size with greater than 50% respiratory variability, suggesting right atrial pressure of 3 mmHg. FINDINGS  Left Ventricle: Left ventricular ejection fraction, by estimation, is 50 to 55%. The left ventricle has low normal function. The left ventricle has no regional wall motion abnormalities. The left ventricular internal cavity size was normal in size. There is no left ventricular hypertrophy. Left ventricular diastolic parameters were normal. Right Ventricle: The right ventricular size is normal. No increase in right ventricular wall thickness. Right ventricular systolic function is normal. Left Atrium: Left atrial size was mildly dilated. Right Atrium: Right atrial size was mildly dilated. Pericardium: There is no evidence of pericardial effusion. Mitral Valve: The mitral valve is normal in structure. Mild mitral valve regurgitation. No evidence of mitral valve stenosis. MV peak gradient, 4.2 mmHg. The mean mitral valve gradient is 2.0 mmHg. Tricuspid Valve: The tricuspid valve is normal in structure. Tricuspid valve regurgitation is mild . No evidence of tricuspid stenosis. Aortic Valve: The aortic valve is normal in structure. Aortic valve regurgitation is not visualized. No aortic stenosis is present. Aortic valve mean gradient measures 5.0 mmHg. Aortic valve peak gradient measures 8.4 mmHg. Aortic valve area, by VTI measures 2.11 cm. Pulmonic Valve: The pulmonic valve was  normal in structure. Pulmonic valve regurgitation is trivial. No evidence of pulmonic stenosis. Aorta: The aortic root is normal in size and structure. Venous: The inferior vena cava is normal in size with greater than 50% respiratory variability, suggesting right atrial pressure of 3 mmHg. IAS/Shunts: No atrial level shunt detected by color flow Doppler.  LEFT VENTRICLE PLAX 2D LVIDd:         4.20 cm     Diastology LVIDs:         3.00 cm     LV e' medial:    9.36 cm/s LV PW:         0.90 cm     LV E/e' medial:  11.1 LV IVS:        1.00 cm     LV e' lateral:   12.90 cm/s LVOT diam:     1.90 cm     LV E/e' lateral: 8.1 LV SV:         71 LV SV Index:   48 LVOT Area:     2.84 cm  LV Volumes (MOD) LV vol d, MOD A2C: 87.7 ml LV vol d, MOD A4C: 71.0 ml LV vol s, MOD A2C: 42.1 ml LV vol s, MOD A4C: 33.6 ml LV SV MOD A2C:     45.6 ml LV SV MOD A4C:     71.0 ml LV SV MOD BP:      43.0 ml RIGHT VENTRICLE             IVC RV Basal diam:  3.50 cm     IVC diam: 2.30 cm RV S prime:     12.40 cm/s TAPSE (M-mode): 2.6 cm LEFT ATRIUM             Index  RIGHT ATRIUM           Index LA diam:        3.60 cm 2.41 cm/m   RA Area:     15.10 cm LA Vol (A2C):   48.9 ml 32.77 ml/m  RA Volume:   36.20 ml  24.26 ml/m LA Vol (A4C):   46.5 ml 31.16 ml/m LA Biplane Vol: 48.0 ml 32.17 ml/m  AORTIC VALVE AV Area (Vmax):    2.08 cm AV Area (Vmean):   2.05 cm AV Area (VTI):     2.11 cm AV Vmax:           144.50 cm/s AV Vmean:          104.000 cm/s AV VTI:            0.336 m AV Peak Grad:      8.4 mmHg AV Mean Grad:      5.0 mmHg LVOT Vmax:         106.00 cm/s LVOT Vmean:        75.250 cm/s LVOT VTI:          0.250 m LVOT/AV VTI ratio: 0.74  AORTA Ao Root diam: 2.60 cm Ao Asc diam:  3.20 cm MITRAL VALVE                TRICUSPID VALVE MV Area (PHT): 3.72 cm     TR Peak grad:   33.4 mmHg MV Area VTI:   3.09 cm     TR Mean grad:   18.0 mmHg MV Peak grad:  4.2 mmHg     TR Vmax:        289.00 cm/s MV Mean grad:  2.0 mmHg     TR Vmean:        204.0 cm/s MV Vmax:       1.03 m/s MV Vmean:      66.4 cm/s    SHUNTS MV Decel Time: 204 msec     Systemic VTI:  0.25 m MV E velocity: 104.00 cm/s  Systemic Diam: 1.90 cm MV Jhonnie Aliano velocity: 73.80 cm/s MV E/Arliene Rosenow ratio:  1.41 Arvilla Meres MD Electronically signed by Arvilla Meres MD Signature Date/Time: 08/06/2022/11:05:50 AM    Final    DG CHEST PORT 1 VIEW  Result Date: 08/05/2022 CLINICAL DATA:  [id number] Multifocal pneumonia [id number]. EXAM: PORTABLE CHEST 1 VIEW COMPARISON:  07/05/2022. FINDINGS: Increasing hazy opacities in the left mid and lower lung, as well as the right upper lobe, consistent with multifocal pneumonia. Stable cardiac and mediastinal contours. Possible small left parapneumonic effusion. No pneumothorax. IMPRESSION: Increasing multifocal pneumonia, left greater than right. Possible small left parapneumonic effusion. Electronically Signed   By: Orvan Falconer M.D.   On: 08/05/2022 15:21        Scheduled Meds:  Chlorhexidine Gluconate Cloth  6 each Topical Daily   citalopram  20 mg Oral Daily   insulin aspart  0-20 Units Subcutaneous TID WC   insulin aspart  0-5 Units Subcutaneous QHS   insulin aspart  5 Units Subcutaneous TID WC   insulin glargine-yfgn  15 Units Subcutaneous BID   mouth rinse  15 mL Mouth Rinse 4 times per day   pantoprazole  40 mg Oral QAC breakfast   Continuous Infusions:  meropenem (MERREM) IV Stopped (08/07/22 0555)   micafungin (MYCAMINE) 100 mg in sodium chloride 0.9 % 100 mL IVPB Stopped (08/06/22 2117)   vancomycin Stopped (08/06/22 1711)     LOS: 9 days  Time spent: 40 min critical care time    Lacretia Nicks, MD [facility]   To contact the attending provider between 7A-7P or the covering provider during after hours 7P-7A, please log into the web site [email or web address] and access using universal [place] password for that web site. If you do not have the password, please call the hospital operator.  08/07/2022, 8:16 AM

## 2022-08-07 NOTE — Progress Notes (Signed)
OT Cancellation Note  Patient Details Name: Amanda Duarte MRN: [id number] DOB: May 12, 1955   Cancelled Treatment:    Reason Eval/Treat Not Completed: Fatigue/lethargy limiting ability to participate- Chart reviewing pt and noted PT had recently attempted and pt refused due to fatigue. Will check back tomorrow as time allows.   Theodoro Clock 08/07/2022, 1:22 PM

## 2022-08-07 NOTE — Plan of Care (Signed)
  Problem: Education: Goal: Knowledge of General Education information will improve Description: Including pain rating scale, medication(s)/side effects and non-pharmacologic comfort measures Outcome: Progressing   Problem: Clinical Measurements: Goal: Diagnostic test results will improve Outcome: Progressing   Problem: Elimination: Goal: Will not experience complications related to urinary retention Outcome: Progressing   Problem: Skin Integrity: Goal: Risk for impaired skin integrity will decrease Outcome: Progressing   

## 2022-08-07 NOTE — Progress Notes (Signed)
PT Cancellation Note  Patient Details Name: Amanda Duarte MRN: [id number] DOB: 11-15-1955   Cancelled Treatment:    Reason Eval/Treat Not Completed:  Attempted PT eval-RN requested we check back some other time-pt just finished getting cleaned up and was put back to bed to rest. Will likely check back another day for PT eval.    Faye Ramsay, PT Acute Rehabilitation  Office: [phone]

## 2022-08-08 ENCOUNTER — Inpatient Hospital Stay: Payer: Medicare Other | Admitting: Hematology

## 2022-08-08 ENCOUNTER — Inpatient Hospital Stay: Payer: Medicare Other

## 2022-08-08 ENCOUNTER — Encounter (HOSPITAL_COMMUNITY): Payer: Self-pay | Admitting: Pulmonary Disease

## 2022-08-08 DIAGNOSIS — J189 Pneumonia, unspecified organism: Secondary | ICD-10-CM | POA: Diagnosis not present

## 2022-08-08 DIAGNOSIS — R5081 Fever presenting with conditions classified elsewhere: Secondary | ICD-10-CM | POA: Diagnosis not present

## 2022-08-08 DIAGNOSIS — J90 Pleural effusion, not elsewhere classified: Secondary | ICD-10-CM | POA: Diagnosis not present

## 2022-08-08 DIAGNOSIS — D709 Neutropenia, unspecified: Secondary | ICD-10-CM | POA: Diagnosis not present

## 2022-08-08 LAB — GLUCOSE, CAPILLARY
Glucose-Capillary: 99 mg/dL (ref 70–99)
Glucose-Capillary: 229 mg/dL — ABNORMAL HIGH (ref 70–99)
Glucose-Capillary: 194 mg/dL — ABNORMAL HIGH (ref 70–99)
Glucose-Capillary: 140 mg/dL — ABNORMAL HIGH (ref 70–99)

## 2022-08-08 LAB — BPAM RBC
Blood Product Expiration Date: 202408082359
Blood Product Expiration Date: 202408092359
Blood Product Expiration Date: 202408092359
ISSUE DATE / TIME: 202407120032
ISSUE DATE / TIME: 202407122040
Unit Type and Rh: 5100
Unit Type and Rh: 5100
Unit Type and Rh: 5100

## 2022-08-08 LAB — TYPE AND SCREEN
ABO/RH(D): O POS
Antibody Screen: NEGATIVE
Unit division: 0
Unit division: 0
Unit division: 0

## 2022-08-08 LAB — ACID FAST SMEAR (AFB, MYCOBACTERIA): Acid Fast Smear: NEGATIVE

## 2022-08-08 LAB — COMPREHENSIVE METABOLIC PANEL
ALT: 18 U/L (ref 0–44)
AST: 31 U/L (ref 15–41)
Albumin: 2 g/dL — ABNORMAL LOW (ref 3.5–5.0)
Alkaline Phosphatase: 87 U/L (ref 38–126)
Anion gap: 10 (ref 5–15)
BUN: 9 mg/dL (ref 8–23)
CO2: 24 mmol/L (ref 22–32)
Calcium: 7.4 mg/dL — ABNORMAL LOW (ref 8.9–10.3)
Chloride: 98 mmol/L (ref 98–111)
Creatinine, Ser: 0.36 mg/dL — ABNORMAL LOW (ref 0.44–1.00)
GFR, Estimated: >60 mL/min (ref >60)
Glucose, Bld: 194 mg/dL — ABNORMAL HIGH (ref 70–99)
Potassium: 3.8 mmol/L (ref 3.5–5.1)
Sodium: 132 mmol/L — ABNORMAL LOW (ref 135–145)
Total Bilirubin: 0.9 mg/dL (ref 0.3–1.2)
Total Protein: 6.8 g/dL (ref 6.5–8.1)

## 2022-08-08 LAB — CBC WITH DIFFERENTIAL/PLATELET
Abs Immature Granulocytes: 0.04 10*3/uL (ref 0.00–0.07)
Basophils Absolute: 0 10*3/uL (ref 0.0–0.1)
Basophils Relative: 1 %
Eosinophils Absolute: 0 10*3/uL (ref 0.0–0.5)
Eosinophils Relative: 0 %
HCT: 27 % — ABNORMAL LOW (ref 36.0–46.0)
Hemoglobin: 9.9 g/dL — ABNORMAL LOW (ref 12.0–15.0)
Immature Granulocytes: 3 %
Lymphocytes Relative: 58 %
Lymphs Abs: 0.8 10*3/uL (ref 0.7–4.0)
MCH: 34.5 pg — ABNORMAL HIGH (ref 26.0–34.0)
MCHC: 36.7 g/dL — ABNORMAL HIGH (ref 30.0–36.0)
MCV: 94.1 fL (ref 80.0–100.0)
Monocytes Absolute: 0.3 10*3/uL (ref 0.1–1.0)
Monocytes Relative: 25 %
Neutro Abs: 0.2 10*3/uL — CL (ref 1.7–7.7)
Neutrophils Relative %: 13 %
Platelets: 20 10*3/uL — CL (ref 150–400)
RBC: 2.87 MIL/uL — ABNORMAL LOW (ref 3.87–5.11)
RDW: 17.8 % — ABNORMAL HIGH (ref 11.5–15.5)
WBC: 1.4 10*3/uL — CL (ref 4.0–10.5)
nRBC: 5.1 % — ABNORMAL HIGH (ref 0.0–0.2)

## 2022-08-08 LAB — CULTURE, BLOOD (ROUTINE X 2)
Culture: NO GROWTH
Special Requests: ADEQUATE
Special Requests: ADEQUATE

## 2022-08-08 LAB — BODY FLUID CULTURE W GRAM STAIN
Culture: NO GROWTH
Gram Stain: NONE SEEN

## 2022-08-08 LAB — ASPERGILLUS ANTIGEN, BAL/SERUM: Aspergillus Ag, BAL/Serum: 0.06 Index (ref 0.00–0.49)

## 2022-08-08 LAB — PHOSPHORUS: Phosphorus: 3.4 mg/dL (ref 2.5–4.6)

## 2022-08-08 LAB — MAGNESIUM: Magnesium: 1.4 mg/dL — ABNORMAL LOW (ref 1.7–2.4)

## 2022-08-08 LAB — OSMOLALITY, URINE: Osmolality, Ur: 523 mOsm/kg (ref 300–900)

## 2022-08-08 LAB — CYTOLOGY - NON PAP

## 2022-08-08 MED ORDER — FUROSEMIDE 10 MG/ML IJ SOLN
40.0000 mg | Freq: Once | INTRAMUSCULAR | Status: AC
  Administered 2022-08-08: 40 mg via INTRAVENOUS
  Filled 2022-08-08: qty 4

## 2022-08-08 MED ORDER — POTASSIUM CHLORIDE CRYS ER 20 MEQ PO TBCR
40.0000 meq | EXTENDED_RELEASE_TABLET | Freq: Once | ORAL | Status: AC
  Administered 2022-08-08: 40 meq via ORAL
  Filled 2022-08-08: qty 2

## 2022-08-08 MED ORDER — MAGNESIUM SULFATE 4 GM/100ML IV SOLN
4.0000 g | Freq: Once | INTRAVENOUS | Status: AC
  Administered 2022-08-08: 4 g via INTRAVENOUS
  Filled 2022-08-08: qty 100

## 2022-08-08 MED ORDER — PANTOPRAZOLE SODIUM 40 MG PO TBEC
40.0000 mg | DELAYED_RELEASE_TABLET | Freq: Two times a day (BID) | ORAL | Status: DC
  Administered 2022-08-08 – 2022-08-15 (×13): 40 mg via ORAL
  Filled 2022-08-08 (×13): qty 1

## 2022-08-08 MED ORDER — INSULIN GLARGINE-YFGN 100 UNIT/ML ~~LOC~~ SOLN
10.0000 [IU] | Freq: Two times a day (BID) | SUBCUTANEOUS | Status: DC
  Administered 2022-08-08 – 2022-08-13 (×10): 10 [IU] via SUBCUTANEOUS
  Filled 2022-08-08 (×12): qty 0.1

## 2022-08-08 NOTE — Anesthesia Postprocedure Evaluation (Signed)
Anesthesia Post Note  Patient: Amanda Duarte  Procedure(s) Performed: VIDEO BRONCHOSCOPY WITHOUT FLUORO BRONCHIAL WASHINGS     Patient location during evaluation: PACU Anesthesia Type: General Level of consciousness: awake and alert Pain management: pain level controlled Vital Signs Assessment: post-procedure vital signs reviewed and stable Respiratory status: spontaneous breathing, nonlabored ventilation, respiratory function stable and patient connected to nasal cannula oxygen Cardiovascular status: blood pressure returned to baseline and stable Postop Assessment: no apparent nausea or vomiting Anesthetic complications: no   No notable events documented.  Last Vitals:  Vitals:   08/08/22 0517 08/08/22 0536  BP:    Pulse: (!) 117   Resp: (!) 29   Temp:  (!) 38.4 C  SpO2: 91%     Last Pain:  Vitals:   08/08/22 0536  TempSrc: Oral  PainSc:                  Valarie Farace S

## 2022-08-08 NOTE — Progress Notes (Signed)
PT Cancellation Note  Patient Details Name: Amanda Duarte MRN: [id number] DOB: [birth year]   Cancelled Treatment:    Reason Eval/Treat Not Completed:  Attempted PT eval-family declined participation at this time due to patient resting. They requested PT check back another time. Will check back as schedule allows.    Faye Ramsay, PT Acute Rehabilitation  Office: [phone]

## 2022-08-08 NOTE — Plan of Care (Signed)
Discussed with patient and family plan of care for the evening, pain management and temperature control with some teach back displayed.    Problem: Education: Goal: Knowledge of General Education information will improve Description: Including pain rating scale, medication(s)/side effects and non-pharmacologic comfort measures Outcome: Progressing   Problem: Health Behavior/Discharge Planning: Goal: Ability to manage health-related needs will improve Outcome: Progressing

## 2022-08-08 NOTE — Progress Notes (Signed)
PROGRESS NOTE    Amanda Duarte  YQM:[id number] DOB: 1955-05-16 DOA: 07/29/2022 PCP: Claiborne Rigg, NP  Chief Complaint  Patient presents with   Fever    Brief Narrative:   67 y.o. female with medical history significant for treated breast cancer, insulin-dependent type 2 diabetes, myelodysplastic syndrome being admitted to the hospital with neutropenic fever.  She presented to oncology clinic with Dr. Mosetta Putt for routine follow-up, complaint of sore fever but no other symptoms, is currently on azacitadine injection. Today in the oncology clinic she was found to have fever 100.4. Lab work was obtained, showed platelets 14,000 was given 1 unit platelet transfusion in the oncology office and then sent to [facility] ED for evaluation  Chest x-ray with mild peribronchial thickening, UA without evidence of infection. Blood and urine cultures were obtained, she was started empirically on IV vancomycin and IV cefepime.   Assessment & Plan:   Principal Problem:   Neutropenic fever (HCC) Active Problems:   Multifocal pneumonia  Goals of Care - appreciate Dr. Latanya Maudlin discussion with family.  She's full code at this time.  Neutropenic fever  Sepsis Due to Multifocal Pneumonia  E. Coli UTI -recurrent fevers this morning -Recently treated for facial cellulitis - CT orbits was again obtained which shows resolution of periorbital cellulitis -CT chest abdomen pelvis with multifocal airspace disease with tree in bud and nodular opacities throughout all lobes of both lungs - multifocal pneumonia - CXR 7/12 with increasing multifocal pneumonia, possible small L parapneumonic effusion  - repeat CXR 7/14 with layering L effusion, multifocal airspace disease -> will discuss possible thora with pulm  -Urine culture grew E. coli, pansensitive -ANC 0.1 -appreciate ID assistance - meropenem, vanc, micafungin - will consult ENT with regards to sinusitis (Dr. Pollyann Kennedy recommended waiting for bronch results and can consider  further workup if unrevealing from bronch standpoint) -appreciate pulmonary assistance - s/p bronch 7/12 at 10 AM -> copious light green secretions noted especially in LLL bronchus, no endobronchial lesions - specimens sent for - cell count (70 nucleated cells, 2% neutrophils), cytology pending, culture no growth 2 days and gram stain (no organisms or WBC's seen), AFB, fungal culture and stain.  Pneumocystis smear negative, aspergillus Ag, AFB smear (negative) and culture. - negative strep pneumonia ag, urine legionella negative, histoplasma antigen negative, negative crypto ag, fungitell negative, blastomyces Ag pending - SLP eval -> regular, thin - blood cultures NGTD (repeat 7/11 NGTDx3) - appreciate oncology assistance  Acute Hypoxic Respiratory Failure - due to multifocal pneumonia, follow with treatment - net positive 12 L, will start some gentle lasix as tolerated  Hypotension resolved  Hypertension Losartan currently on hold, BP's being taken on leg, unclear how accurate these are (widely fluctuating with some significantly high blood pressures being recorded) - she's asymptomatic Follow with lasix   Lactic Acidosis - improved after IVF, will monitor - related to infection above  Lethargy  Acute Metabolic Encephalopathy  Elevated Ammonia Level - due to suspected infectious process above, also just had procedure today - TSH, b12, folate wnl, VBG without hypercarbia - ammonia is elevated, unclear significance in absence of cirrhosis - her mental status has improved - will hold off on any further lactulose for now   Mild dysplastic syndrome, high-grade Pancytopenia  Severe Thrombocytopenia  Anemia  Neutropenia as Above -Diagnosed in 10/23 -Pulmonary biopsy showed high-grade myeloid neoplasm -Started on azacitidine injection -She was seen at [facility] for bone marrow transplant -Repeat bone marrow biopsy in April 2024 showed  persistent high-grade MDS, no evidence of AML  transformation -Refer to leukemia specialist Dr. Sharyne Richters pending per oncology -s/p 5 units of platelets and 2 unit pRBC -Oncology following - transfse for Hb <7.5, platelets < 15,000.  Appreciate Dr. Latanya Maudlin care, she's continuing goals of care conversations with family - currently full code.     UTI -Urine culture grew E. 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, triple negative -Diagnosed in 4/19 -S/p left mastectomy, neoadjuvant chemotherapy and radiation treatment -Had excellent response -Recent mammogram on 7/23 and right axilla ultrasound on 10/23 were benign -She is on surveillance now, followed by oncology as outpatient    DVT prophylaxis: SCD Code Status: full Family Communication: both daughter 7/12, granddaughter at bedside 7/13 Disposition:   Status is: Inpatient Remains inpatient appropriate because: need for continued inpatient care   Consultants:  ID oncology  Procedures:  none  Antimicrobials:  Anti-infectives (From admission, onward)    Start     Dose/Rate Route Frequency Ordered Stop   08/04/22 1400  meropenem (MERREM) 1 g in sodium chloride 0.9 % 100 mL IVPB        1 g 200 mL/hr over 30 Minutes Intravenous Every 8 hours 08/04/22 0915     08/02/22 1700  metroNIDAZOLE (FLAGYL) tablet 500 mg  Status:  Discontinued        500 mg Oral Every 12 hours 08/02/22 1446 08/04/22 0915   08/02/22 1600  ceFEPIme (MAXIPIME) 2 g in sodium chloride 0.9 % 100 mL IVPB  Status:  Discontinued        2 g 200 mL/hr over 30 Minutes Intravenous Every 12 hours 08/02/22 1506 08/04/22 0915   08/02/22 1600  vancomycin (VANCOREADY) IVPB 750 mg/150 mL        750 mg 150 mL/hr over 60 Minutes Intravenous Every 24 hours  08/02/22 1520     08/02/22 1000  cefTRIAXone (ROCEPHIN) 2 g in sodium chloride 0.9 % 100 mL IVPB  Status:  Discontinued        2 g 200 mL/hr over 30 Minutes Intravenous Every 24 hours 08/01/22 1841 08/02/22 1506   08/01/22 2000  micafungin (MYCAMINE) 100 mg in sodium chloride 0.9 % 100 mL IVPB        100 mg 105 mL/hr over 1 Hours Intravenous Every 24 hours 08/01/22 1835     07/30/22 1800  vancomycin (VANCOREADY) IVPB 750 mg/150 mL  Status:  Discontinued        750 mg 150 mL/hr over 60 Minutes Intravenous Every 24 hours 07/29/22 1825 07/31/22 0924   07/30/22 0500  ceFEPIme (MAXIPIME) 2 g in sodium chloride 0.9 % 100 mL IVPB        2 g 200 mL/hr over 30 Minutes Intravenous Every 12 hours 07/29/22 1825 08/01/22 2130   07/29/22 1630  ceFEPIme (MAXIPIME) 2 g in sodium chloride 0.9 % 100 mL IVPB        2 g 200 mL/hr over 30 Minutes Intravenous  Once 07/29/22 1617 07/29/22 1818   07/29/22 1630  vancomycin (VANCOCIN) IVPB 1000 mg/200 mL premix        1,000 mg 200 mL/hr over 60 Minutes Intravenous  Once 07/29/22 1628 07/30/22 0749       Subjective: Spanish interpreter  used Always denies any complaints -> when asked specifically, she notes increased cough  Objective: Vitals:   08/08/22 0536 08/08/22 0723 08/08/22 0800 08/08/22 0805  BP:   (!) 154/50   Pulse:  (!) 110 (!) 109   Resp:  (!) 30 (!) 31   Temp: (!) 101.1 F (38.4 C)   99.5 F (37.5 C)  TempSrc: Oral   Oral  SpO2:  91% 91%   Weight:      Height:        Intake/Output Summary (Last 24 hours) at 08/08/2022 0836 Last data filed at 08/08/2022 0721 Gross per 24 hour  Intake 548.93 ml  Output 1000 ml  Net -451.07 ml   Filed Weights   07/29/22 1747 07/29/22 1748 08/05/22 0929  Weight: 55 kg 52.1 kg 52.1 kg    Examination:  General: No acute distress. Cardiovascular: sinus tach, regular Lungs: diffuse coarse breath sounds, tachypneic, frequent cough  Neurological: Alert and oriented 3. Moves all extremities 4 .  Cranial nerves II through XII grossly intact. Skin: Clammy. No rashes or lesions.  Data Reviewed: I have personally reviewed following labs and imaging studies  CBC: Recent Labs  Lab 08/04/22 0846 08/05/22 0406 08/05/22 1659 08/06/22 0316 08/07/22 0257 08/08/22 0325  WBC 0.8* 1.0* 0.8* 0.8* 1.3* 1.4*  NEUTROABS 0.1* 0.1*  --  0.1* 0.1* 0.2*  HGB 7.2* 7.8* 7.2* 10.1* 9.7* 9.9*  HCT 22.3* 23.1* 20.8* 29.6* 28.1* 27.0*  MCV 85.8 84.9 86.3 88.4 90.4 94.1  PLT 9* 26* 41* 43* 32* 20*    Basic Metabolic Panel: Recent Labs  Lab 08/04/22 0846 08/05/22 0406 08/06/22 0316 08/07/22 0257 08/08/22 0325  NA 128* 132* 133* 136 132*  K 3.2* 4.1 4.3 4.0 3.8  CL 99 103 105 104 98  CO2 20* 21* 19* 23 24  GLUCOSE 132* 222* 376* 109* 194*  BUN 21 15 22 19 9   CREATININE 0.62 0.59 0.55 0.41* 0.36*  CALCIUM 8.1* 8.0* 7.7* 8.0* 7.4*  MG 1.4* 2.3 2.2 1.8 1.4*  PHOS 1.8* 2.2* 2.0* 3.1 3.4    GFR: Estimated Creatinine Clearance: 51.5 mL/min (Jo-Ann Johanning) (by C-G formula based on SCr of 0.36 mg/dL (L)).  Liver Function Tests: Recent Labs  Lab 08/04/22 0846 08/05/22 0406 08/06/22 0316 08/07/22 0257 08/08/22 0325  AST 18 18 14* 14* 31  ALT 14 13 12 11 18   ALKPHOS 55 58 63 61 87  BILITOT 0.9 0.7 0.9 0.7 0.9  PROT 7.3 7.2 6.6 6.9 6.8  ALBUMIN 2.5* 2.3* 2.1* 2.1* 2.0*    CBG: Recent Labs  Lab 08/07/22 0803 08/07/22 1141 08/07/22 1535 08/07/22 2207 08/08/22 0824  GLUCAP 92 128* 77 181* 99     Recent Results (from the past 240 hour(s))  Culture, blood (Routine X 2) w Reflex to ID Panel     Status: None   Collection Time: 07/29/22 10:12 AM   Specimen: BLOOD  Result Value Ref Range Status   Specimen Description   Final    BLOOD Performed at [facility], [address] [place], [place] [address]    Special Requests   Final    BLOOD Performed at [facility], [address], [place], [place] [address]    Culture   Final    NO  GROWTH 5 DAYS Performed at [facility], [address], [place], [place] [address]    Report Status 08/03/2022 FINAL  Final  Culture, blood (Routine X 2) w  Reflex to ID Panel     Status: None   Collection Time: 07/29/22 10:14 AM   Specimen: BLOOD  Result Value Ref Range Status   Specimen Description   Final    BLOOD Performed at [facility], [address], [place], [place] [address]    Special Requests   Final    BLOOD Performed at [facility], [address], [place], [place] [address]    Culture   Final    NO GROWTH 5 DAYS Performed at [facility], [address] [place], [place] [address]    Report Status 08/03/2022 FINAL  Final  Urine Culture     Status: Abnormal   Collection Time: 07/29/22 10:38 AM   Specimen: Urine, Random  Result Value Ref Range Status   Specimen Description   Final    URINE, RANDOM Performed at [facility], [address], [place], [place] [address]    Special Requests   Final    NONE Performed at [facility], [address], [place], [place] [address]    Culture 10,000 COLONIES/mL ESCHERICHIA COLI (Cristine Daw)  Final   Report Status 07/31/2022 FINAL  Final   Organism ID, Bacteria ESCHERICHIA COLI (Jeremy Ditullio)  Final      Susceptibility   Escherichia coli - MIC*    AMPICILLIN <=2 SENSITIVE Sensitive     CEFAZOLIN <=4 SENSITIVE Sensitive     CEFEPIME <=0.12 SENSITIVE Sensitive     CEFTRIAXONE <=0.25 SENSITIVE Sensitive     CIPROFLOXACIN <=0.25 SENSITIVE Sensitive     GENTAMICIN <=1 SENSITIVE Sensitive     IMIPENEM <=0.25 SENSITIVE Sensitive     NITROFURANTOIN <=16 SENSITIVE Sensitive     TRIMETH/SULFA <=20 SENSITIVE Sensitive     AMPICILLIN/SULBACTAM <=2 SENSITIVE Sensitive     PIP/TAZO <=4 SENSITIVE Sensitive     * 10,000 COLONIES/mL ESCHERICHIA COLI  Resp panel by RT-PCR (RSV, Flu Reeta Kuk&B, Covid) Anterior Nasal Swab     Status: None    Collection Time: 07/29/22  2:39 PM   Specimen: Anterior Nasal Swab  Result Value Ref Range Status   SARS Coronavirus 2 by RT PCR NEGATIVE NEGATIVE Final    Comment: (NOTE) SARS-CoV-2 target nucleic acids are NOT DETECTED.  The SARS-CoV-2 RNA is generally detectable in upper respiratory specimens during the acute phase of infection. The lowest concentration of SARS-CoV-2 viral copies this assay can detect is 138 copies/mL. Gailya Tauer negative result does not preclude SARS-Cov-2 infection and should not be used as the sole basis for treatment or other patient management decisions. Sherry Blackard negative result may occur with  improper specimen collection/handling, submission of specimen other than nasopharyngeal swab, presence of viral mutation(s) within the areas targeted by this assay, and inadequate number of viral copies(<138 copies/mL). Yariela Tison negative result must be combined with clinical observations, patient history, and epidemiological information. The expected result is Negative.  Fact Sheet for Patients:  BloggerCourse.com  Fact Sheet for Healthcare Providers:  SeriousBroker.it  This test is no t yet approved or cleared by the [place] FDA and  has been authorized for detection and/or diagnosis of SARS-CoV-2 by FDA under an Emergency Use Authorization (EUA). This EUA will remain  in effect (meaning this test can be used) for the duration of the COVID-19 declaration under Section 564(b)(1) of the Act, 21 U.S.C.section 360bbb-3(b)(1), unless the authorization is terminated  or revoked sooner.       Influenza Desarai Barrack by PCR NEGATIVE NEGATIVE  Final   Influenza B by PCR NEGATIVE NEGATIVE Final    Comment: (NOTE) The Xpert Xpress SARS-CoV-2/FLU/RSV plus assay is intended as an aid in the diagnosis of influenza from Nasopharyngeal swab specimens and should not be used as Johnetta Sloniker sole basis for treatment. Nasal washings and aspirates are unacceptable for  Xpert Xpress SARS-CoV-2/FLU/RSV testing.  Fact Sheet for Patients: BloggerCourse.com  Fact Sheet for Healthcare Providers: SeriousBroker.it  This test is not yet approved or cleared by the [place] FDA and has been authorized for detection and/or diagnosis of SARS-CoV-2 by FDA under an Emergency Use Authorization (EUA). This EUA will remain in effect (meaning this test can be used) for the duration of the COVID-19 declaration under Section 564(b)(1) of the Act, 21 U.S.C. section 360bbb-3(b)(1), unless the authorization is terminated or revoked.     Resp Syncytial Virus by PCR NEGATIVE NEGATIVE Final    Comment: (NOTE) Fact Sheet for Patients: BloggerCourse.com  Fact Sheet for Healthcare Providers: SeriousBroker.it  This test is not yet approved or cleared by the [place] FDA and has been authorized for detection and/or diagnosis of SARS-CoV-2 by FDA under an Emergency Use Authorization (EUA). This EUA will remain in effect (meaning this test can be used) for the duration of the COVID-19 declaration under Section 564(b)(1) of the Act, 21 U.S.C. section 360bbb-3(b)(1), unless the authorization is terminated or revoked.  Performed at [facility], [address], [place], [place] [address]   Group Nakesha Ebrahim Strep by PCR     Status: None   Collection Time: 07/29/22  2:40 PM   Specimen: Throat; Sterile Swab  Result Value Ref Range Status   Group Kimbly Eanes Strep by PCR NOT DETECTED NOT DETECTED Final    Comment: Performed at [facility], [address], [place], [place] [address]  Culture, blood (Routine X 2) w Reflex to ID Panel     Status: None (Preliminary result)   Collection Time: 08/04/22  5:34 AM   Specimen: BLOOD  Result Value Ref Range Status   Specimen Description   Final    BLOOD BLOOD RIGHT HAND Performed at [facility], [address] [place], [place] [address]    Special Requests   Final    BOTTLES DRAWN AEROBIC ONLY Blood Culture adequate volume Performed at [facility], [address] [place], [place] [address]    Culture   Final    NO GROWTH 3 DAYS Performed at [facility], [address] [place], [place] [address]    Report Status PENDING  Incomplete  Culture, blood (Routine X 2) w Reflex to ID Panel     Status: None (Preliminary result)   Collection Time: 08/04/22  5:34 AM   Specimen: BLOOD  Result Value Ref Range Status   Specimen Description   Final    BLOOD RIGHT WRIST Performed at [facility], [address] [place], [place] [address]    Special Requests   Final    BOTTLES DRAWN AEROBIC ONLY Blood Culture adequate volume Performed at [facility], [address], [place], [place] [address]    Culture   Final    NO GROWTH 3 DAYS Performed at [facility], [address] [place], [place] [address]    Report Status PENDING  Incomplete  Expectorated Sputum Assessment w Gram Stain, Rflx to Resp Cult     Status: None   Collection Time: 08/04/22  7:44 PM  Specimen: Expectorated Sputum  Result Value Ref Range Status   Specimen Description EXPECTORATED SPUTUM  Final   Special Requests NONE  Final   Sputum evaluation   Final    Sputum specimen not acceptable for testing.  Please recollect.   NOTIFIED JEREMY RN Performed at [facility], [address] [place], [place] [address]    Report Status 08/04/2022 FINAL  Final  Acid Fast Smear (AFB)     Status: None   Collection Time: 08/05/22 10:23 AM   Specimen: PATH Cytology Bronchial lavage; Body Fluid  Result Value Ref Range Status   AFB Specimen Processing Concentration  Final   Acid Fast Smear Negative  Final    Comment: (NOTE) Performed At: [facility] [address] [place], [place] [id number] Jolene Schimke MD  KG:[id number]    Source (AFB) BRONCHIAL ALVEOLAR LAVAGE  Final    Comment: L Performed at [facility], [address] [place], [place] [address]   Body fluid culture w Gram Stain     Status: None (Preliminary result)   Collection Time: 08/05/22 10:23 AM   Specimen: PATH Cytology Bronchial lavage; Body Fluid  Result Value Ref Range Status   Specimen Description   Final    BRONCHIAL ALVEOLAR LAVAGE L Performed at [facility], [address] [place], [place] [address]    Special Requests   Final    NONE Performed at [facility], [address], [place], [place] [address]    Gram Stain NO WBC SEEN NO ORGANISMS SEEN   Final   Culture   Final    NO GROWTH 2 DAYS Performed at [facility], [address], [place], [place] [address]    Report Status PENDING  Incomplete  Pneumocystis smear by DFA     Status: None   Collection Time: 08/05/22 10:32 AM   Specimen: Bronchial Wash; Respiratory  Result Value Ref Range Status   Specimen Source-[facility] BRONCHIAL ALVEOLAR LAVAGE  Final    Comment: L   Pneumocystis jiroveci Ag NEGATIVE  Final    Comment: Performed at [facility] Performed at [facility], [address] [place], [place] [address]   MRSA Next Gen by PCR, Nasal     Status: None   Collection Time: 08/05/22  7:07 PM   Specimen: Nasal Mucosa; Nasal Swab  Result Value Ref Range Status   MRSA by PCR Next Gen NOT DETECTED NOT DETECTED Final    Comment: (NOTE) The GeneXpert MRSA Assay (FDA approved for NASAL specimens only), is one component of Jacquilyn Seldon comprehensive MRSA colonization surveillance program. It is not intended to diagnose MRSA infection nor to guide or monitor treatment for MRSA infections. Test performance is not FDA approved in patients less than 39 years old. Performed at [facility], [address], [place], [place] [address]           Radiology Studies: DG CHEST PORT 1 VIEW  Result Date: 08/07/2022 CLINICAL DATA:  Shortness of breath EXAM: PORTABLE CHEST 1 VIEW COMPARISON:  08/05/2022 FINDINGS: Heart mediastinal contours are within normal limits. Patchy bilateral airspace disease, similar to prior study. Layering left pleural effusion. No acute bony abnormality. IMPRESSION: Bilateral multifocal airspace disease compatible with pneumonia. Layering left effusion. No change. Electronically Signed   By: Charlett Nose M.D.   On: 08/07/2022 09:51   ECHOCARDIOGRAM COMPLETE  Result Date: 08/06/2022    ECHOCARDIOGRAM REPORT   Patient  Name:   ISOLDE SKAFF Date of Exam: 08/06/2022 Medical Rec #:  [id number]   Height:       61.0 in Accession #:    [id number]  Weight:       114.9 lb Date of Birth:  20-Mar-1955    BSA:          1.492 m Patient Age:    67 years    BP:           142/62 mmHg Patient Gender: F           HR:           79 bpm. Exam Location:  Inpatient Procedure: 2D Echo, Cardiac Doppler and Color Doppler Indications:    Other abnormalities of the heart  History:        Patient has prior history of Echocardiogram examinations, most                 recent 12/07/2021. Cancer and Stroke, Signs/Symptoms:Chest Pain                 and Fever; Risk Factors:Hypertension, Diabetes, Dyslipidemia and                 cellulitis.  Sonographer:    Wallie Char Referring Phys: [phone] Harold Mattes CALDWELL POWELL JR IMPRESSIONS  1. Left ventricular ejection fraction, by estimation, is 50 to 55%. The left ventricle has low normal function. The left ventricle has no regional wall motion abnormalities. Left ventricular diastolic parameters were normal.  2. Right ventricular systolic function is normal. The right ventricular size is normal.  3. Left atrial size was mildly dilated.  4. Right atrial size was mildly dilated.  5. The mitral valve is normal in structure. Mild mitral valve regurgitation. No evidence of mitral stenosis.  6. The aortic valve is normal in  structure. Aortic valve regurgitation is not visualized. No aortic stenosis is present.  7. The inferior vena cava is normal in size with greater than 50% respiratory variability, suggesting right atrial pressure of 3 mmHg. FINDINGS  Left Ventricle: Left ventricular ejection fraction, by estimation, is 50 to 55%. The left ventricle has low normal function. The left ventricle has no regional wall motion abnormalities. The left ventricular internal cavity size was normal in size. There is no left ventricular hypertrophy. Left ventricular diastolic parameters were normal. Right Ventricle: The right ventricular size is normal. No increase in right ventricular wall thickness. Right ventricular systolic function is normal. Left Atrium: Left atrial size was mildly dilated. Right Atrium: Right atrial size was mildly dilated. Pericardium: There is no evidence of pericardial effusion. Mitral Valve: The mitral valve is normal in structure. Mild mitral valve regurgitation. No evidence of mitral valve stenosis. MV peak gradient, 4.2 mmHg. The mean mitral valve gradient is 2.0 mmHg. Tricuspid Valve: The tricuspid valve is normal in structure. Tricuspid valve regurgitation is mild . No evidence of tricuspid stenosis. Aortic Valve: The aortic valve is normal in structure. Aortic valve regurgitation is not visualized. No aortic stenosis is present. Aortic valve mean gradient measures 5.0 mmHg. Aortic valve peak gradient measures 8.4 mmHg. Aortic valve area, by VTI measures 2.11 cm. Pulmonic Valve: The pulmonic valve was normal in structure. Pulmonic valve regurgitation is trivial. No evidence of pulmonic stenosis. Aorta: The aortic root is normal in size and structure. Venous: The inferior vena cava is normal in size with greater than 50% respiratory variability, suggesting right atrial pressure of 3 mmHg. IAS/Shunts: No atrial level shunt  detected by color flow Doppler.  LEFT VENTRICLE PLAX 2D LVIDd:         4.20 cm     Diastology  LVIDs:         3.00 cm     LV e' medial:    9.36 cm/s LV PW:         0.90 cm     LV E/e' medial:  11.1 LV IVS:        1.00 cm     LV e' lateral:   12.90 cm/s LVOT diam:     1.90 cm     LV E/e' lateral: 8.1 LV SV:         71 LV SV Index:   48 LVOT Area:     2.84 cm  LV Volumes (MOD) LV vol d, MOD A2C: 87.7 ml LV vol d, MOD A4C: 71.0 ml LV vol s, MOD A2C: 42.1 ml LV vol s, MOD A4C: 33.6 ml LV SV MOD A2C:     45.6 ml LV SV MOD A4C:     71.0 ml LV SV MOD BP:      43.0 ml RIGHT VENTRICLE             IVC RV Basal diam:  3.50 cm     IVC diam: 2.30 cm RV S prime:     12.40 cm/s TAPSE (M-mode): 2.6 cm LEFT ATRIUM             Index        RIGHT ATRIUM           Index LA diam:        3.60 cm 2.41 cm/m   RA Area:     15.10 cm LA Vol (A2C):   48.9 ml 32.77 ml/m  RA Volume:   36.20 ml  24.26 ml/m LA Vol (A4C):   46.5 ml 31.16 ml/m LA Biplane Vol: 48.0 ml 32.17 ml/m  AORTIC VALVE AV Area (Vmax):    2.08 cm AV Area (Vmean):   2.05 cm AV Area (VTI):     2.11 cm AV Vmax:           144.50 cm/s AV Vmean:          104.000 cm/s AV VTI:            0.336 m AV Peak Grad:      8.4 mmHg AV Mean Grad:      5.0 mmHg LVOT Vmax:         106.00 cm/s LVOT Vmean:        75.250 cm/s LVOT VTI:          0.250 m LVOT/AV VTI ratio: 0.74  AORTA Ao Root diam: 2.60 cm Ao Asc diam:  3.20 cm MITRAL VALVE                TRICUSPID VALVE MV Area (PHT): 3.72 cm     TR Peak grad:   33.4 mmHg MV Area VTI:   3.09 cm     TR Mean grad:   18.0 mmHg MV Peak grad:  4.2 mmHg     TR Vmax:        289.00 cm/s MV Mean grad:  2.0 mmHg     TR Vmean:       204.0 cm/s MV Vmax:       1.03 m/s MV Vmean:      66.4 cm/s    SHUNTS MV Decel Time: 204 msec  Systemic VTI:  0.25 m MV E velocity: 104.00 cm/s  Systemic Diam: 1.90 cm MV Suetta Hoffmeister velocity: 73.80 cm/s MV E/Adewale Pucillo ratio:  1.41 Arvilla Meres MD Electronically signed by Arvilla Meres MD Signature Date/Time: 08/06/2022/11:05:50 AM    Final         Scheduled Meds:  Chlorhexidine Gluconate Cloth  6 each Topical Daily    citalopram  20 mg Oral Daily   furosemide  20 mg Intravenous Daily   insulin aspart  0-20 Units Subcutaneous TID WC   insulin aspart  0-5 Units Subcutaneous QHS   insulin aspart  5 Units Subcutaneous TID WC   insulin glargine-yfgn  15 Units Subcutaneous BID   mouth rinse  15 mL Mouth Rinse 4 times per day   pantoprazole  40 mg Oral QAC breakfast   Continuous Infusions:  meropenem (MERREM) IV Stopped (08/08/22 0618)   micafungin (MYCAMINE) 100 mg in sodium chloride 0.9 % 100 mL IVPB Stopped (08/07/22 2201)   vancomycin Stopped (08/07/22 1720)     LOS: 10 days    Time spent: 40 min critical care time    Lacretia Nicks, MD [facility]   To contact the attending provider between 7A-7P or the covering provider during after hours 7P-7A, please log into the web site [email or web address] and access using universal [place] password for that web site. If you do not have the password, please call the hospital operator.  08/08/2022, 8:36 AM

## 2022-08-08 NOTE — Progress Notes (Signed)
Chaplain engaged in visit with Amanda Duarte and her family at bedside. They did not want or need Chaplain at this time. They had already called their own ministers and support.   Chaplain is available if needed.    08/08/22 1500  Spiritual Encounters  Type of Visit Initial  Care provided to: Pt and family  Reason for visit Routine spiritual support

## 2022-08-08 NOTE — Evaluation (Addendum)
Occupational Therapy Evaluation Patient Details Name: Amanda Duarte MRN: [id number] DOB: 1955-04-01 Today's Date: 08/08/2022   History of Present Illness Pt is a 67 y/o F presenting to ED on 7/5 from oncology office with fever, CXR with mild peribronchial thickening, UA without evidence of infection, admitted for neutropenic fever. PMH includes HTN, HLD, insulin-dependent DM2, myelodysplasia syndrome and chemotherapy 2x/week, chronic thrombocytopenia and neutropenia.   Clinical Impression   PTA, pt ind with ADLs and functional mobility, lives with family who can provide 24/7 assist at home. Per son-in-law, pt was needing standby assist for walking up stairs and was more sedentary leading up to admission. Pt currently needing set up - mod A for ADLs, up in room with NSG upon arrival, needing mod A +2 for bed mobility and min A +2 for step pivot transfer to [facility]. Pt with elevated HR and BP (see below), further mobility deferred at this time and pt returned to supine in bed. Pt presenting with impairments listed below, will follow acutely. Anticipate pt can d/c home with [facility] and family assist pending progression.  HR 115 BP 199/81 (122) post-transfer SpO2 in 90's on 3.5L O2      Recommendations for follow up therapy are one component of a multi-disciplinary discharge planning process, led by the attending physician.  Recommendations may be updated based on patient status, additional functional criteria and insurance authorization.   Assistance Recommended at Discharge Frequent or constant Supervision/Assistance  Patient can return home with the following A little help with walking and/or transfers;A lot of help with bathing/dressing/bathroom;Assistance with cooking/housework;Direct supervision/assist for medications management;Direct supervision/assist for financial management;Assist for transportation;Help with stairs or ramp for entrance    Functional Status Assessment  Patient has had a recent  decline in their functional status and demonstrates the ability to make significant improvements in function in a reasonable and predictable amount of time.  Equipment Recommendations  BSC/3in1    Recommendations for Other Services PT consult     Precautions / Restrictions Precautions Precautions: Fall Precaution Comments: watch vitals, protective precautions Restrictions Weight Bearing Restrictions: No      Mobility Bed Mobility Overal bed mobility: Needs Assistance Bed Mobility: Sit to Supine       Sit to supine: +2 for safety/equipment, Mod assist   General bed mobility comments: assist for BLE's    Transfers Overall transfer level: Needs assistance Equipment used: 2 person hand held assist Transfers: Sit to/from Stand, Bed to chair/wheelchair/BSC Sit to Stand: Min assist, +2 physical assistance, +2 safety/equipment     Step pivot transfers: Min assist, +2 safety/equipment, +2 physical assistance            Balance Overall balance assessment: Needs assistance Sitting-balance support: Feet supported Sitting balance-Leahy Scale: Fair     Standing balance support: During functional activity Standing balance-Leahy Scale: Fair                             ADL either performed or assessed with clinical judgement   ADL Overall ADL's : Needs assistance/impaired Eating/Feeding: Set up;Sitting   Grooming: Sitting;Minimal assistance   Upper Body Bathing: Moderate assistance;Sitting   Lower Body Bathing: Moderate assistance;Sitting/lateral leans   Upper Body Dressing : Moderate assistance;Sitting   Lower Body Dressing: Moderate assistance;Sitting/lateral leans   Toilet Transfer: Minimal assistance;+2 for physical assistance;Stand-pivot;BSC/3in1   Toileting- Clothing Manipulation and Hygiene: Supervision/safety;Sit to/from stand       Functional mobility during ADLs: Minimal assistance;+2 for safety/equipment;+2  for physical assistance        Vision   Vision Assessment?: No apparent visual deficits     Perception Perception Perception Tested?: No   Praxis Praxis Praxis tested?: Not tested    Pertinent Vitals/Pain Pain Assessment Pain Assessment: No/denies pain     Hand Dominance Right   Extremity/Trunk Assessment Upper Extremity Assessment Upper Extremity Assessment: Generalized weakness   Lower Extremity Assessment Lower Extremity Assessment: Generalized weakness       Communication Communication Communication: Prefers language other than English;Interpreter utilized;Other (comment) (family present to interpret)   Cognition Arousal/Alertness: Awake/alert Behavior During Therapy: WFL for tasks assessed/performed Overall Cognitive Status: Within Functional Limits for tasks assessed                                 General Comments: family present to interpret, responds appropriately to questions asked, overall fatigued     General Comments  HR ~115 and with elevated BP 199/81 (122 ) SpO2 WNL on 3.5L O2    Exercises     Shoulder Instructions      Home Living Family/patient expects to be discharged to:: Private residence Living Arrangements: Other relatives (daughter and son in law) Available Help at Discharge: Family;Available 24 hours/day Type of Home: House Home Access: Stairs to enter [employer] of Steps: 1   Home Layout: Two level;Able to live on main level with bedroom/bathroom Alternate Level Stairs-Number of Steps: flight   Bathroom Shower/Tub: Chief Strategy Officer: Standard     Home Equipment: None          Prior Functioning/Environment Prior Level of Function : Independent/Modified Independent             Mobility Comments: min guard A for stairs PTA, no AD use ADLs Comments: ind        OT Problem List: Decreased strength;Decreased range of motion;Decreased activity tolerance;Impaired balance (sitting and/or  standing);Decreased coordination;Decreased safety awareness;Cardiopulmonary status limiting activity      OT Treatment/Interventions: Self-care/ADL training;Therapeutic exercise;Energy conservation;DME and/or AE instruction;Therapeutic activities;Balance training;Patient/family education    OT Goals(Current goals can be found in the care plan section) Acute Rehab OT Goals Patient Stated Goal: to rest OT Goal Formulation: With patient Time For Goal Achievement: 07/27/2022 Potential to Achieve Goals: Good ADL Goals Pt Will Perform Upper Body Dressing: with min assist;sitting Pt Will Perform Lower Body Dressing: with min assist;sitting/lateral leans;sit to/from stand Pt Will Transfer to Toilet: with min assist;ambulating;regular height toilet Additional ADL Goal #1: pt will be able to stand x5 min for functional task in order to improve activity tolerance for ADLs  OT Frequency: Min 1X/week    Co-evaluation              AM-PAC OT "6 Clicks" Daily Activity     Outcome Measure Help from another person eating meals?: A Little Help from another person taking care of personal grooming?: A Little Help from another person toileting, which includes using toliet, bedpan, or urinal?: A Little Help from another person bathing (including washing, rinsing, drying)?: A Lot Help from another person to put on and taking off regular upper body clothing?: A Lot Help from another person to put on and taking off regular lower body clothing?: A Lot 6 Click Score: 15   End of Session Equipment Utilized During Treatment: Oxygen (3.5L) Nurse Communication: Mobility status (confirmed can see pt, pt OK to have orange juice)  Activity Tolerance:  Treatment limited secondary to medical complications (Comment) (elevated BP and  HR) Patient left: in bed;with call bell/phone within reach;with bed alarm set;with family/visitor present  OT Visit Diagnosis: Unsteadiness on feet (R26.81);Other abnormalities of gait  and mobility (R26.89);Muscle weakness (generalized) (M62.81)                Time: 1610-9604 OT Time Calculation (min): 21 min Charges:  OT General Charges $OT Visit: 1 Visit OT Evaluation $OT Eval Moderate Complexity: 1 Mod  Khylen Riolo K, OTD, OTR/L SecureChat Preferred Acute Rehab [phone]  Carver Fila Koonce 08/08/2022, 1:46 PM

## 2022-08-08 NOTE — Progress Notes (Signed)
NAME:  Amanda Duarte, MRN:  [id number], DOB:  16-Aug-1955, LOS: 10 ADMISSION DATE:  07/29/2022, CONSULTATION DATE:  08/04/2022 REFERRING MD:  Dr. Lowell Guitar - [facility], CHIEF COMPLAINT: Multifocal pneumonia  History of Present Illness:  Amanda Duarte is a 67 year old female with past medical history significant for left breast cancer, type 2 diabetes, myelodysplastic syndrome HTN, HLD, and prior stroke who initially presented to the emergency department from oncology office in 7/5 with fever.  On presentation to oncology office patient was seen to have a temperature of 100.4 with thrombocytopenia.  He is admitted per hospitalist service for management of possible underlying infection, started empirically on IV vancomycin and cefepime.  Mid morning of 7/11 PCCM consulted for worsening fever with Tmax 103.2 in the setting of multifocal pneumonia and E. coli UTI.  PCCM consulted for assistance in management.  Pertinent  Medical History   left breast cancer, type 2 diabetes, myelodysplastic syndrome HTN, HLD, and prior stroke  Significant Hospital Events: Including procedures, antibiotic start and stop dates in addition to other pertinent events   7/05 during outpatient oncology follow-up patient found to be febrile and thrombocytopenic started on empiric antibiotics UA negative for bacteria, positive protein, negative nitrates and leukocytes.  Urine culture positive for 10,000 colonies of pansensitive E. coli 7/9 ID consulted and recommended advanced imaging CT chest/abdomen/pelvis with signs of multifocal pneumonia 7/11 or worsening fever with Tmax 103.2, PCCM consulted for assistance in bronchoscopy 7/12 Bronch  Interim History / Subjective:  Afebrile since early 7/12 but tmax 101.2 overnight.  Worsening left pleural effusion.  Stable O2 requirements.  Ongoing non-producitve cough.   Objective   Blood pressure (!) 185/61, pulse (!) 111, temperature 100.1 F (37.8 C), temperature source Oral, resp. rate (!)  24, height 5\' 1"  (1.549 m), weight 52.1 kg, SpO2 92%.        Intake/Output Summary (Last 24 hours) at 08/08/2022 1302 Last data filed at 08/08/2022 1135 Gross per 24 hour  Intake 668.93 ml  Output 1220 ml  Net -551.07 ml   Filed Weights   07/29/22 1747 07/29/22 1748 08/05/22 0929  Weight: 55 kg 52.1 kg 52.1 kg   Examination: General:  elderly female lying in bed in NAD, resting with eyes closed.   Grandson at bedside  HEENT: MM pink/moist, mild facial flushing Neuro: easily awakens, appropriate, MAE  CV: rr, ST PULM:  non labored, diffusely coarse, scattered rales, congested cough GI: soft, bs+ Extremities: warm/dry, no LE edema   Net +12L although multiple urinary occurrences unmeasured over several days Labs reviewed > K 3.8, Mag 1.4, sCr 0.36, ANC 0.2, WBC 1.4, plts 20 CXR 7/14> stable patchy opacities, increasing left pleural effusion since 7/12, possibly layering   Resolved Hospital Problem list    Assessment & Plan:   Multifocal pneumonia Left pleural effusion Acute hypoxic respiratory failure Neutropenic fever Sepsis  MDS with pancytopenia  Recently treated for periorbital cellulitis - UC cx with E. Coli although UA neg  -  previous bronchoscopy 11/2021 when she required mechanical ventilation, Aspergillus antigen was negative then - s/p BAL 7/12; some bile like staining of secretions raises question of aspiration P: - evaluated left pleural space with bedside ultrasound w/mostly consolidated lung, very small pocket> defer thoracentesis for now - additional dose of lasix w/ kcl and repeat CXR in am.   - BAL cytology and fungal pending - blastomyces ag pending - BAL cell count, culture, aspergillus, AFB, pneumocystis negative; urine strep ag and legionella neg - s/p  SLP eval 7/13, functional oropharyngeal swallowing on exam, mild aspiration risk> regular/ thin liquids, SLP signed off.  Question severe reflux with patulous esophagus and hx of radiation > will  change PPI to BID and continue aspiration precautions, avoid HOB< 30 - abx per ID, remains on broad abx for now> meropenum, vanc, micafungin.  Fever curve down after changed to meropenum.  Repeat MRSA PCR 7/12 neg - possible ENT consult pending all BAL results   Remainder per primary team.  PCCM will see again 7/16   Best Practice (right click and "Reselect all SmartList Selections" daily)  Per primary  Labs   CBC: Recent Labs  Lab 08/04/22 0846 08/05/22 0406 08/05/22 1659 08/06/22 0316 08/07/22 0257 08/08/22 0325  WBC 0.8* 1.0* 0.8* 0.8* 1.3* 1.4*  NEUTROABS 0.1* 0.1*  --  0.1* 0.1* 0.2*  HGB 7.2* 7.8* 7.2* 10.1* 9.7* 9.9*  HCT 22.3* 23.1* 20.8* 29.6* 28.1* 27.0*  MCV 85.8 84.9 86.3 88.4 90.4 94.1  PLT 9* 26* 41* 43* 32* 20*    Basic Metabolic Panel: Recent Labs  Lab 08/04/22 0846 08/05/22 0406 08/06/22 0316 08/07/22 0257 08/08/22 0325  NA 128* 132* 133* 136 132*  K 3.2* 4.1 4.3 4.0 3.8  CL 99 103 105 104 98  CO2 20* 21* 19* 23 24  GLUCOSE 132* 222* 376* 109* 194*  BUN 21 15 22 19 9   CREATININE 0.62 0.59 0.55 0.41* 0.36*  CALCIUM 8.1* 8.0* 7.7* 8.0* 7.4*  MG 1.4* 2.3 2.2 1.8 1.4*  PHOS 1.8* 2.2* 2.0* 3.1 3.4   GFR: Estimated Creatinine Clearance: 51.5 mL/min (A) (by C-G formula based on SCr of 0.36 mg/dL (L)). Recent Labs  Lab 08/04/22 0534 08/04/22 0846 08/04/22 2229 08/05/22 0406 08/05/22 0523 08/05/22 1659 08/06/22 0316 08/07/22 0257 08/08/22 0325  WBC  --    < >  --  1.0*  --  0.8* 0.8* 1.3* 1.4*  LATICACIDVEN 1.7  --  3.7* 3.0* 2.4*  --   --   --   --    < > = values in this interval not displayed.    Liver Function Tests: Recent Labs  Lab 08/04/22 0846 08/05/22 0406 08/06/22 0316 08/07/22 0257 08/08/22 0325  AST 18 18 14* 14* 31  ALT 14 13 12 11 18   ALKPHOS 55 58 63 61 87  BILITOT 0.9 0.7 0.9 0.7 0.9  PROT 7.3 7.2 6.6 6.9 6.8  ALBUMIN 2.5* 2.3* 2.1* 2.1* 2.0*   No results for input(s): "LIPASE", "AMYLASE" in the last 168  hours.  Recent Labs  Lab 08/04/22 1309 08/05/22 0406 08/06/22 0817 08/07/22 0257  AMMONIA 57* 41* 55* 44*    ABG    Component Value Date/Time   PHART 7.39 12/07/2021 1130   PCO2ART 36 12/07/2021 1130   PO2ART 101 12/07/2021 1130   HCO3 22.3 08/04/2022 1309   TCO2 30 05/31/2017 1113   ACIDBASEDEF 0.4 08/04/2022 1309   O2SAT 93.4 08/04/2022 1309     Coagulation Profile: No results for input(s): "INR", "PROTIME" in the last 168 hours.  Cardiac Enzymes: No results for input(s): "CKTOTAL", "CKMB", "CKMBINDEX", "TROPONINI" in the last 168 hours.  HbA1C: Hemoglobin A1C  Date/Time Value Ref Range Status  05/16/2022 09:42 AM 10.0 (A) 4.0 - 5.6 % Final   HbA1c, POC (controlled diabetic range)  Date/Time Value Ref Range Status  10/28/2021 12:07 PM 11.4 (A) 0.0 - 7.0 % Final  06/30/2020 04:02 PM 8.8 (A) 0.0 - 7.0 % Final   Hgb A1c MFr Bld  Date/Time Value Ref Range Status  07/10/2022 09:48 AM 11.9 (H) 4.8 - 5.6 % Final    Comment:    (NOTE) Pre diabetes:          5.7%-6.4%  Diabetes:              >6.4%  Glycemic control for   <7.0% adults with diabetes   01/31/2022 10:15 AM 10.7 (H) 4.8 - 5.6 % Final    Comment:             Prediabetes: 5.7 - 6.4          Diabetes: >6.4          Glycemic control for adults with diabetes: <7.0     CBG: Recent Labs  Lab 08/07/22 0803 08/07/22 1141 08/07/22 1535 08/07/22 2207 08/08/22 0824  GLUCAP 92 128* 77 181* 99       Posey Boyer, MSN, NP, AG-ACNP-BC [facility] 08/08/2022, 1:02 PM  See Amion for pager If no response to pager , please call [phone] until 7pm After 7:00 pm call Elink  [phone]

## 2022-08-09 ENCOUNTER — Inpatient Hospital Stay: Payer: Medicare Other

## 2022-08-09 ENCOUNTER — Inpatient Hospital Stay (HOSPITAL_COMMUNITY): Payer: Medicare Other

## 2022-08-09 ENCOUNTER — Encounter (HOSPITAL_COMMUNITY): Payer: Self-pay | Admitting: Internal Medicine

## 2022-08-09 DIAGNOSIS — J90 Pleural effusion, not elsewhere classified: Secondary | ICD-10-CM | POA: Diagnosis not present

## 2022-08-09 LAB — CBC WITH DIFFERENTIAL/PLATELET
Abs Immature Granulocytes: 0.02 10*3/uL (ref 0.00–0.07)
Basophils Absolute: 0 10*3/uL (ref 0.0–0.1)
Basophils Relative: 1 %
Eosinophils Absolute: 0 10*3/uL (ref 0.0–0.5)
Eosinophils Relative: 1 %
HCT: 27.6 % — ABNORMAL LOW (ref 36.0–46.0)
Hemoglobin: 9.8 g/dL — ABNORMAL LOW (ref 12.0–15.0)
Immature Granulocytes: 2 %
Lymphocytes Relative: 72 %
Lymphs Abs: 0.8 10*3/uL (ref 0.7–4.0)
MCH: 33.9 pg (ref 26.0–34.0)
MCHC: 35.5 g/dL (ref 30.0–36.0)
MCV: 95.5 fL (ref 80.0–100.0)
Monocytes Absolute: 0.1 10*3/uL (ref 0.1–1.0)
Monocytes Relative: 9 %
Neutro Abs: 0.2 10*3/uL — CL (ref 1.7–7.7)
Neutrophils Relative %: 15 %
Platelets: 24 10*3/uL — CL (ref 150–400)
RBC: 2.89 MIL/uL — ABNORMAL LOW (ref 3.87–5.11)
RDW: 17.2 % — ABNORMAL HIGH (ref 11.5–15.5)
WBC: 1 10*3/uL — CL (ref 4.0–10.5)
nRBC: 3.9 % — ABNORMAL HIGH (ref 0.0–0.2)

## 2022-08-09 LAB — CULTURE, BLOOD (ROUTINE X 2): Culture: NO GROWTH

## 2022-08-09 LAB — GLUCOSE, CAPILLARY
Glucose-Capillary: 90 mg/dL (ref 70–99)
Glucose-Capillary: 195 mg/dL — ABNORMAL HIGH (ref 70–99)
Glucose-Capillary: 155 mg/dL — ABNORMAL HIGH (ref 70–99)
Glucose-Capillary: 184 mg/dL — ABNORMAL HIGH (ref 70–99)

## 2022-08-09 LAB — FUNGUS CULTURE RESULT

## 2022-08-09 LAB — BASIC METABOLIC PANEL
Anion gap: 9 (ref 5–15)
BUN: 9 mg/dL (ref 8–23)
CO2: 26 mmol/L (ref 22–32)
Calcium: 7.6 mg/dL — ABNORMAL LOW (ref 8.9–10.3)
Chloride: 95 mmol/L — ABNORMAL LOW (ref 98–111)
Creatinine, Ser: 0.39 mg/dL — ABNORMAL LOW (ref 0.44–1.00)
GFR, Estimated: >60 mL/min (ref >60)
Glucose, Bld: 105 mg/dL — ABNORMAL HIGH (ref 70–99)
Potassium: 4.2 mmol/L (ref 3.5–5.1)
Sodium: 130 mmol/L — ABNORMAL LOW (ref 135–145)

## 2022-08-09 LAB — MAGNESIUM: Magnesium: 1.8 mg/dL (ref 1.7–2.4)

## 2022-08-09 LAB — FUNGUS CULTURE WITH STAIN

## 2022-08-09 MED ORDER — FUROSEMIDE 10 MG/ML IJ SOLN
40.0000 mg | Freq: Once | INTRAMUSCULAR | Status: AC
  Administered 2022-08-09: 40 mg via INTRAVENOUS
  Filled 2022-08-09: qty 4

## 2022-08-09 MED ORDER — ORAL CARE MOUTH RINSE: 15.0000 mL | OROMUCOSAL | Status: DC | PRN

## 2022-08-09 MED ORDER — FUROSEMIDE 10 MG/ML IJ SOLN
40.0000 mg | Freq: Every day | INTRAMUSCULAR | Status: DC
  Administered 2022-08-10: 40 mg via INTRAVENOUS
  Filled 2022-08-09: qty 4

## 2022-08-09 NOTE — Progress Notes (Signed)
PROGRESS NOTE    Amanda Duarte  WJX:[id number] DOB: 27-Jul-1955 DOA: 07/29/2022 PCP: Claiborne Rigg, NP  Chief Complaint  Patient presents with   Fever    Brief Narrative:   67 y.o. female with medical history significant for treated breast cancer, insulin-dependent type 2 diabetes, myelodysplastic syndrome being admitted to the hospital with neutropenic fever.  She presented to oncology clinic with Dr. Mosetta Putt for routine follow-up, complaint of sore fever but no other symptoms, is currently on azacitadine injection.  On day of presentation, she was found to have fever 100.4. Lab work was obtained, showed platelets 14,000 was given 1 unit platelet transfusion in the oncology office and then sent to [facility] ED for evaluation.  She's been treated for neutropenic fever and sepsis due to presumed multifocal pneumonia.  CT with multifocal pneumonia.  On broad abx with meropenem, vanc, micafungin.  S/p bronchoscopy, specimens pending.  ID, pulmonary following.  ENT consulted with regards to sinusitis and recommended waiting for bronch results before working sinusitis up further.   She's continuing to have frequent fevers.    Assessment & Plan:   Principal Problem:   Neutropenic fever (HCC) Active Problems:   Multifocal pneumonia   Pleural effusion on left  Goals of Care - appreciate Dr. Latanya Maudlin discussion with family.  She's full code at this time.  Neutropenic fever  Sepsis Due to Multifocal Pneumonia  E. Coli UTI -recurrent fevers again this morning -Recently treated for facial cellulitis - CT orbits was again obtained which shows resolution of periorbital cellulitis -CT chest abdomen pelvis with multifocal airspace disease with tree in bud and nodular opacities throughout all lobes of both lungs - multifocal pneumonia - CXR 7/12 with increasing multifocal pneumonia, possible small L parapneumonic effusion  - repeat CXR 7/15 with hazy density of lower L hemithorax, possibly due to layering  effusion, heterogenous airspace opacities  - appreciate pulm, effusion too small for thora 7/15 -Urine culture grew E. coli, pansensitive -ANC 0.1 -appreciate ID assistance - meropenem, vanc, micafungin - will consult ENT with regards to sinusitis (Dr. Pollyann Kennedy recommended waiting for bronch results and can consider further workup if unrevealing from bronch standpoint) -appreciate pulmonary assistance - s/p bronch 7/12 at 10 AM -> copious light green secretions noted especially in LLL bronchus, no endobronchial lesions - specimens sent for - cell count (70 nucleated cells, 2% neutrophils), cytology no malignant cells, macrophages and benign bronchial cells, culture no growth 2 days and gram stain (no organisms or WBC's seen), AFB, fungal culture and stain.  Pneumocystis smear negative, aspergillus Ag, AFB smear (negative) and culture. - negative strep pneumonia ag, urine legionella negative, histoplasma antigen negative, negative crypto ag, fungitell negative, blastomyces Ag pending - SLP eval -> regular, thin - blood cultures NGTD (repeat 7/11 NGTDx5) - appreciate oncology assistance  Acute Hypoxic Respiratory Failure - due to multifocal pneumonia, follow with treatment - net positive 12 L, will start some gentle lasix as tolerated  Hypotension resolved  Hypertension Losartan currently on hold, BP's being taken on leg, unclear how accurate these are (widely fluctuating with some significantly high blood pressures being recorded) - she's asymptomatic Follow with lasix   Lactic Acidosis - improved after IVF, will monitor - related to infection above  Lethargy  Acute Metabolic Encephalopathy  Elevated Ammonia Level - due to suspected infectious process above - TSH, b12, folate wnl, VBG without hypercarbia - overall, recently has been less altered, more awake/alert - ammonia is elevated, unclear significance in absence of cirrhosis -  her mental status has improved - will hold off on any  further lactulose for now   Mild dysplastic syndrome, high-grade Pancytopenia  Severe Thrombocytopenia  Anemia  Neutropenia as Above -Diagnosed in 10/23 -Pulmonary biopsy showed high-grade myeloid neoplasm -Started on azacitidine injection -She was seen at [facility] for bone marrow transplant -Repeat bone marrow biopsy in April 2024 showed persistent high-grade MDS, 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 Sharyne Richters pending per oncology -s/p 5 units of platelets and 2 unit pRBC -Oncology following - transfse for Hb <7.5, platelets < 15,000.  Appreciate Dr. Latanya Maudlin care, she's continuing goals of care conversations with family - currently full code.     UTI -Urine culture grew E. 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, triple negative -Diagnosed in 4/19 -S/p left mastectomy, neoadjuvant chemotherapy and radiation treatment -Had excellent response -Recent mammogram on 7/23 and right axilla ultrasound on 10/23 were benign -She is on surveillance now, followed by oncology as outpatient    DVT prophylaxis: SCD Code Status: full Family Communication: both daughter 7/12, granddaughter at bedside 7/13 Disposition:   Status is: Inpatient Remains inpatient appropriate because: need for continued inpatient care   Consultants:  ID oncology  Procedures:  none  Antimicrobials:  Anti-infectives (From admission, onward)    Start     Dose/Rate Route Frequency Ordered Stop   08/04/22 1400  meropenem (MERREM) 1 g in sodium chloride 0.9 % 100 mL IVPB        1 g 200 mL/hr over 30 Minutes Intravenous Every 8 hours 08/04/22 0915     08/02/22 1700  metroNIDAZOLE (FLAGYL) tablet 500 mg  Status:   Discontinued        500 mg Oral Every 12 hours 08/02/22 1446 08/04/22 0915   08/02/22 1600  ceFEPIme (MAXIPIME) 2 g in sodium chloride 0.9 % 100 mL IVPB  Status:  Discontinued        2 g 200 mL/hr over 30 Minutes Intravenous Every 12 hours 08/02/22 1506 08/04/22 0915   08/02/22 1600  vancomycin (VANCOREADY) IVPB 750 mg/150 mL        750 mg 150 mL/hr over 60 Minutes Intravenous Every 24 hours 08/02/22 1520     08/02/22 1000  cefTRIAXone (ROCEPHIN) 2 g in sodium chloride 0.9 % 100 mL IVPB  Status:  Discontinued        2 g 200 mL/hr over 30 Minutes Intravenous Every 24 hours 08/01/22 1841 08/02/22 1506   08/01/22 2000  micafungin (MYCAMINE) 100 mg in sodium chloride 0.9 % 100 mL IVPB        100 mg 105 mL/hr over 1 Hours Intravenous Every 24 hours 08/01/22 1835     07/30/22 1800  vancomycin (VANCOREADY) IVPB 750 mg/150 mL  Status:  Discontinued        750 mg 150 mL/hr over 60 Minutes Intravenous Every 24 hours 07/29/22 1825 07/31/22 0924   07/30/22 0500  ceFEPIme (MAXIPIME) 2 g in sodium chloride 0.9 % 100 mL IVPB        2 g 200 mL/hr over 30 Minutes Intravenous Every 12 hours 07/29/22 1825 08/01/22 2130   07/29/22 1630  ceFEPIme (MAXIPIME) 2 g in sodium chloride 0.9 % 100 mL IVPB  2 g 200 mL/hr over 30 Minutes Intravenous  Once 07/29/22 1617 07/29/22 1818   07/29/22 1630  vancomycin (VANCOCIN) IVPB 1000 mg/200 mL premix        1,000 mg 200 mL/hr over 60 Minutes Intravenous  Once 07/29/22 1628 07/30/22 0749       Subjective: Spanish interpreter used Always denies any complaints -> denies SOB, fatigue  Objective: Vitals:   08/09/22 0600 08/09/22 0706 08/09/22 0807 08/09/22 0900  BP: (!) 187/68   (!) 136/59  Pulse: 99 (!) 106 (!) 116 (!) 118  Resp: 18 (!) 37 (!) 22 (!) 31  Temp:   (!) 103.2 F (39.6 C) 100.1 F (37.8 C)  TempSrc:   Oral Oral  SpO2: 100% 98% 93% 100%  Weight:      Height:        Intake/Output Summary (Last 24 hours) at 08/09/2022 0916 Last data filed  at 08/09/2022 0800 Gross per 24 hour  Intake 751.37 ml  Output 222 ml  Net 529.37 ml   Filed Weights   07/29/22 1748 08/05/22 0929 08/09/22 0500  Weight: 52.1 kg 52.1 kg 54.9 kg    Examination:  General: No acute distress.  Looks more generally ill today, tired.  Chronically ill appearing. Cardiovascular: tachycardic Lungs: tachypneic, coarse breath sounds Abdomen: Soft, nontender, nondistended Neurological: Alert and oriented 3. Moves all extremities 4 with equal strength. Cranial nerves II through XII grossly intact. Skin: Warm and dry. No rashes or lesions. Extremities: No clubbing or cyanosis. No edema.   Data Reviewed: I have personally reviewed following labs and imaging studies  CBC: Recent Labs  Lab 08/05/22 0406 08/05/22 1659 08/06/22 0316 08/07/22 0257 08/08/22 0325 08/09/22 0550  WBC 1.0* 0.8* 0.8* 1.3* 1.4* 1.0*  NEUTROABS 0.1*  --  0.1* 0.1* 0.2* 0.2*  HGB 7.8* 7.2* 10.1* 9.7* 9.9* 9.8*  HCT 23.1* 20.8* 29.6* 28.1* 27.0* 27.6*  MCV 84.9 86.3 88.4 90.4 94.1 95.5  PLT 26* 41* 43* 32* 20* 24*    Basic Metabolic Panel: Recent Labs  Lab 08/04/22 0846 08/05/22 0406 08/06/22 0316 08/07/22 0257 08/08/22 0325 08/09/22 0550  NA 128* 132* 133* 136 132* 130*  K 3.2* 4.1 4.3 4.0 3.8 4.2  CL 99 103 105 104 98 95*  CO2 20* 21* 19* 23 24 26   GLUCOSE 132* 222* 376* 109* 194* 105*  BUN 21 15 22 19 9 9   CREATININE 0.62 0.59 0.55 0.41* 0.36* 0.39*  CALCIUM 8.1* 8.0* 7.7* 8.0* 7.4* 7.6*  MG 1.4* 2.3 2.2 1.8 1.4* 1.8  PHOS 1.8* 2.2* 2.0* 3.1 3.4  --     GFR: Estimated Creatinine Clearance: 51.5 mL/min (Haileyann Staiger) (by C-G formula based on SCr of 0.39 mg/dL (L)).  Liver Function Tests: Recent Labs  Lab 08/04/22 0846 08/05/22 0406 08/06/22 0316 08/07/22 0257 08/08/22 0325  AST 18 18 14* 14* 31  ALT 14 13 12 11 18   ALKPHOS 55 58 63 61 87  BILITOT 0.9 0.7 0.9 0.7 0.9  PROT 7.3 7.2 6.6 6.9 6.8  ALBUMIN 2.5* 2.3* 2.1* 2.1* 2.0*    CBG: Recent Labs  Lab  08/07/22 2207 08/08/22 0824 08/08/22 1201 08/08/22 1650 08/08/22 2153  GLUCAP 181* 99 229* 194* 140*     Recent Results (from the past 240 hour(s))  Culture, blood (Routine X 2) w Reflex to ID Panel     Status: None   Collection Time: 08/04/22  5:34 AM   Specimen: BLOOD  Result Value Ref Range Status   Specimen  Description   Final    BLOOD BLOOD RIGHT HAND Performed at [facility], [address] [place], [place] [address]    Special Requests   Final    BOTTLES DRAWN AEROBIC ONLY Blood Culture adequate volume Performed at [facility], [address] [place], [place] [address]    Culture   Final    NO GROWTH 5 DAYS Performed at [facility], [address] [place], [place] [address]    Report Status 08/09/2022 FINAL  Final  Culture, blood (Routine X 2) w Reflex to ID Panel     Status: None   Collection Time: 08/04/22  5:34 AM   Specimen: BLOOD  Result Value Ref Range Status   Specimen Description   Final    BLOOD RIGHT WRIST Performed at [facility], [address] [place], [place] [address]    Special Requests   Final    BOTTLES DRAWN AEROBIC ONLY Blood Culture adequate volume Performed at [facility], [address], [place], [place] [address]    Culture   Final    NO GROWTH 5 DAYS Performed at [facility], [address], [place], [place] [address]    Report Status 08/09/2022 FINAL  Final  Expectorated Sputum Assessment w Gram Stain, Rflx to Resp Cult     Status: None   Collection Time: 08/04/22  7:44 PM   Specimen: Expectorated Sputum  Result Value Ref Range Status   Specimen Description EXPECTORATED SPUTUM  Final   Special Requests NONE  Final   Sputum evaluation   Final    Sputum specimen not acceptable for testing.  Please recollect.   NOTIFIED JEREMY RN Performed at [facility], [address] [place], [place] [address]    Report Status  08/04/2022 FINAL  Final  Acid Fast Smear (AFB)     Status: None   Collection Time: 08/05/22 10:23 AM   Specimen: PATH Cytology Bronchial lavage; Body Fluid  Result Value Ref Range Status   AFB Specimen Processing Concentration  Final   Acid Fast Smear Negative  Final    Comment: (NOTE) Performed At: [facility] [address] [place], [place] [id number] Jolene Schimke MD [id number]    Source (AFB) BRONCHIAL ALVEOLAR LAVAGE  Final    Comment: L Performed at [facility], [address], [place], [place] [address]   Body fluid culture w Gram Stain     Status: None   Collection Time: 08/05/22 10:23 AM   Specimen: PATH Cytology Bronchial lavage; Body Fluid  Result Value Ref Range Status   Specimen Description   Final    BRONCHIAL ALVEOLAR LAVAGE L Performed at [facility], [address], [place], [place] [address]    Special Requests   Final    NONE Performed at [facility], [address], [place], [place] [address]    Gram Stain NO WBC SEEN NO ORGANISMS SEEN   Final   Culture   Final    NO GROWTH 3 DAYS Performed at [facility], [address], [place], [place] [address]    Report Status 08/08/2022 FINAL  Final  Aspergillus Ag, BAL/Serum     Status: None   Collection Time: 08/05/22 10:32 AM   Specimen: Bronchial Alveolar Lavage  Result Value Ref Range Status   Aspergillus Ag, BAL/Serum 0.06 0.00 - 0.49 Index Final    Comment: (NOTE) Performed At:  [facility] [address] [place], [place] [id number] Jolene Schimke MD MV:[id number]   Pneumocystis smear by DFA     Status: None   Collection Time: 08/05/22 10:32 AM   Specimen: Bronchial Wash; Respiratory  Result Value Ref Range Status   Specimen [facility] BRONCHIAL ALVEOLAR LAVAGE  Final    Comment: L   Pneumocystis jiroveci Ag NEGATIVE  Final    Comment: Performed at [facility] Performed at [facility], [address] [place], [place] [address]   MRSA Next Gen by PCR, Nasal     Status: None   Collection Time: 08/05/22  7:07 PM   Specimen: Nasal Mucosa; Nasal Swab  Result Value Ref Range Status   MRSA by PCR Next Gen NOT DETECTED NOT DETECTED Final    Comment: (NOTE) The GeneXpert MRSA Assay (FDA approved for NASAL specimens only), is one component of Kailee Essman comprehensive MRSA colonization surveillance program. It is not intended to diagnose MRSA infection nor to guide or monitor treatment for MRSA infections. Test performance is not FDA approved in patients less than 36 years old. Performed at [facility], [address], [place], [place] [address]          Radiology Studies: [facility] Chest Port 1 View  Result Date: 08/09/2022 CLINICAL DATA:  Left pleural effusion and pneumonia EXAM: PORTABLE CHEST 1 VIEW COMPARISON:  Chest x-ray dated August 07, 2022 FINDINGS: Rightward patient rotation somewhat limits evaluation. Cardiac and mediastinal contours are unchanged. Similar heterogeneous airspace opacities. Similar hazy density of the lower left hemithorax, possibly due to layering pleural effusion. No evidence of pneumothorax. IMPRESSION: 1. Similar heterogeneous airspace opacities, concerning for infection. 2. Hazy density of the lower left hemithorax, possibly due to layering pleural effusion. Electronically Signed   By: Allegra Lai M.D.   On: 08/09/2022 08:57        Scheduled Meds:  Chlorhexidine Gluconate Cloth  6 each Topical Daily   citalopram  20 mg Oral Daily   furosemide  20 mg Intravenous Daily   insulin aspart  0-20 Units Subcutaneous TID WC   insulin aspart  0-5 Units Subcutaneous QHS   insulin aspart  5 Units Subcutaneous TID WC   insulin glargine-yfgn  10 Units Subcutaneous BID   pantoprazole  40 mg Oral BID   Continuous Infusions:  meropenem (MERREM) IV Stopped (08/09/22 0715)   micafungin (MYCAMINE) 100 mg in sodium chloride  0.9 % 100 mL IVPB Stopped (08/08/22 2200)   vancomycin Stopped (08/08/22 1710)     LOS: 11 days    Time spent: 40 min critical care time    Lacretia Nicks, MD [facility]   To contact the attending provider between 7A-7P or the covering provider during after hours 7P-7A, please log into the web site [email or web address] and access using universal [place] password for that web site. If you do not have the password, please call the hospital operator.  08/09/2022, 9:16 AM

## 2022-08-09 NOTE — Progress Notes (Signed)
Pharmacy Antibiotic Note  Amanda Duarte is a 67 y.o. female admitted on 07/29/2022 with neutropenic fever. Patient with sore throat. 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 Recent admission for orbital cellulitis.  ID consulted and antibiotics broadened. Patient remains on broad spectrum antibiotics at this time. Fevers improved, although remains neutropenic.   Plan: Continue vancomycin 750mg  IV q24h Meropenem and Micafungin per MD Follow up antibiotic plan per ID  Height: 5\' 1"  (154.9 cm) Weight: 54.9 kg (121 lb 0.5 oz) IBW/kg (Calculated) : 47.8  Temp (24hrs), Avg:99 F (37.2 C), Min:98.2 F (36.8 C), Max:100.1 F (37.8 C)  Recent Labs  Lab 08/04/22 0534 08/04/22 0846 08/04/22 2229 08/05/22 0406 08/05/22 0523 08/05/22 1659 08/06/22 0316 08/07/22 0257 08/08/22 0325 08/09/22 0550  WBC  --    < >  --  1.0*  --  0.8* 0.8* 1.3* 1.4* 1.0*  CREATININE  --    < >  --  0.59  --   --  0.55 0.41* 0.36* 0.39*  LATICACIDVEN 1.7  --  3.7* 3.0* 2.4*  --   --   --   --   --    < > = values in this interval not displayed.    Estimated Creatinine Clearance: 51.5 mL/min (A) (by C-G formula based on SCr of 0.39 mg/dL (L)).    No Known Allergies  Antimicrobials this admission: Vancomycin 7/5 >> 7/7; restart 7/9 >> Cefepime 7/5 >> 7/8; restart 7/9 >> 7/11 Ceftriaxone 7/9 x 1 dose Metronidazole 7/9 >> 7/11 Micafungin 7/9 >> Meropenem 7/11 >>    Microbiology results:  7/5 BCx (at [facility]): ngF 7/11 Bcx: ngF 7/5 UCx (at [facility]): 10K E.coli  7/5: Resp panel: neg 7/5 Group A Strep: neg  Thank you for allowing pharmacy to be a part of this patient's care.  Pricilla Riffle, PharmD, BCPS Clinical Pharmacist 08/09/2022 8:58 AM

## 2022-08-09 NOTE — Progress Notes (Signed)
NAME:  Amanda Duarte, MRN:  [id number], DOB:  August 04, 1955, LOS: 11 ADMISSION DATE:  07/29/2022, CONSULTATION DATE:  08/04/2022 REFERRING MD:  Dr. Lowell Guitar - [facility], CHIEF COMPLAINT: Multifocal pneumonia  History of Present Illness:  Amanda Duarte is a 67 year old female with past medical history significant for left breast cancer, type 2 diabetes, myelodysplastic syndrome HTN, HLD, and prior stroke who initially presented to the emergency department from oncology office in 7/5 with fever.  On presentation to oncology office patient was seen to have a temperature of 100.4 with thrombocytopenia.  He is admitted per hospitalist service for management of possible underlying infection, started empirically on IV vancomycin and cefepime.  Mid morning of 7/11 PCCM consulted for worsening fever with Tmax 103.2 in the setting of multifocal pneumonia and E. coli UTI.  PCCM consulted for assistance in management.  Pertinent  Medical History   left breast cancer, type 2 diabetes, myelodysplastic syndrome HTN, HLD, and prior stroke  Significant Hospital Events: Including procedures, antibiotic start and stop dates in addition to other pertinent events   7/05 during outpatient oncology follow-up patient found to be febrile and thrombocytopenic started on empiric antibiotics UA negative for bacteria, positive protein, negative nitrates and leukocytes.  Urine culture positive for 10,000 colonies of pansensitive E. coli 7/9 ID consulted and recommended advanced imaging CT chest/abdomen/pelvis with signs of multifocal pneumonia 7/11 or worsening fever with Tmax 103.2, PCCM consulted for assistance in bronchoscopy 7/12 Bronch  Interim History / Subjective:  Breathing more comfortable.  Not as much cough.  Doesn't feels chest congestion or chest pain.  O2 down to 3 liters.  Objective   Blood pressure (!) 136/59, pulse (!) 108, temperature 100.1 F (37.8 C), temperature source Oral, resp. rate (!) 27, height 5\' 1"  (1.549  m), weight 54.9 kg, SpO2 99%.        Intake/Output Summary (Last 24 hours) at 08/09/2022 0949 Last data filed at 08/09/2022 0800 Gross per 24 hour  Intake 751.37 ml  Output 222 ml  Net 529.37 ml   Filed Weights   07/29/22 1748 08/05/22 0929 08/09/22 0500  Weight: 52.1 kg 52.1 kg 54.9 kg   Examination:  General - alert Eyes - pupils reactive ENT - no sinus tenderness, no stridor Cardiac - regular rate/rhythm, no murmur Chest - decreased BS at bases Abdomen - soft, non tender, + bowel sounds Extremities - no cyanosis, clubbing, or edema Skin - no rashes Neuro - normal strength, moves extremities, follows commands Psych - normal mood and behavior  Assessment & Plan:   Neutropenic fever with multifocal pneumonia. - fever curve seems to improved some since switching to meropenem on 08/04/22 but still spiking fevers - follow up BAL from bronchoscopy on 08/05/22 - continue ABx, antifungals per ID   Left pleural effusion. - based on bedside ultrasound findings, too small to warrant thoracentesis at this time - clinically improved with additional diuresis - continue lasix - f/u CXR intermittently  Pancytopenia in setting of myelodysplastic syndrome. - hematology/oncology consulted   Updated her grandson at bedside who assisted in translation  D/w Dr. Lowell Guitar.  PCCM will sign off.  Please call if additional assistance needed while she is in hospital.  Labs       Latest Ref Rng & Units 08/09/2022    5:50 AM 08/08/2022    3:25 AM 08/07/2022    2:57 AM  CMP  Glucose 70 - 99 mg/dL 409  811  914   BUN 8 - 23 mg/dL  9  9  19    Creatinine 0.44 - 1.00 mg/dL 8.46  9.62  9.52   Sodium 135 - 145 mmol/L 130  132  136   Potassium 3.5 - 5.1 mmol/L 4.2  3.8  4.0   Chloride 98 - 111 mmol/L 95  98  104   CO2 22 - 32 mmol/L 26  24  23    Calcium 8.9 - 10.3 mg/dL 7.6  7.4  8.0   Total Protein 6.5 - 8.1 g/dL  6.8  6.9   Total Bilirubin 0.3 - 1.2 mg/dL  0.9  0.7   Alkaline Phos 38 - 126  U/L  87  61   AST 15 - 41 U/L  31  14   ALT 0 - 44 U/L  18  11       Latest Ref Rng & Units 08/09/2022    5:50 AM 08/08/2022    3:25 AM 08/07/2022    2:57 AM  CBC  WBC 4.0 - 10.5 K/uL 1.0  1.4  1.3   Hemoglobin 12.0 - 15.0 g/dL 9.8  9.9  9.7   Hematocrit 36.0 - 46.0 % 27.6  27.0  28.1   Platelets 150 - 400 K/uL 24  20  32    Signature:  Coralyn Helling, MD [place] [facility] Pager - [phone] or [phone] 08/09/2022, 9:49 AM

## 2022-08-09 NOTE — Progress Notes (Signed)
[facility]  Date of Admission:  07/29/2022     Principal Problem:   Neutropenic fever (HCC) Active Problems:   Multifocal pneumonia   Pleural effusion on left   Pleural effusion          Assessment: 67 year old female with breast cancer, MDS complicated by severe pancytopenia after being on azacitidine with prolonged hospitalization for neutropenic failure requiring intubation/mechanical ventilation, QT prolongation over oteseconazole admission for orbital cellulitis found to have dacryocystitis status post bedside I&D with no purulence encountered, cultures growing Staph epi.  #Neutropenic fevers with ANC 200 today 7/16 #Tree-in-bud opacity on CT #Dacryocystitis status post treatment/appears to be resolved on exam -CT showed confluent areas consolidation for overlying left lower lobe, tree-in-bud and nodular opacities throughout all lobes of both lungs.  Septal thickening. - Social neutropenia was presumed to be pulmonary - Patient underwent bronchoscopy with BAL on 7/12.  Pathology noted cells, pulmonary macrophages and benign bronchial cells.  Bacterial cultures with no growth so far, AFB/fungal cultures pending.  Aspergillus antigen negative.  PJP antigen negative. -I reviewed her discharge summary from 6/24 at [facility].  Noted that patient undergone bedside I&D on 6/21 without frank purulence cultures grew staph epi.  She was eventually on Vanco, cefazolin, Flagyl then switched to Unasyn and discharged on Augmentin to complete course till 6/29 along with ophthalmic erythromycin.  ENT saw her for this and patient underwent nasal endoscopy with no purulence but inflammation was noted.  Sinus cultures at time of discharge were with no growth. Recommendations: - Continue Meropenem and micafungin for now while awaiting path staining for AFB, fungal cultures to evaluate for possible Mucor(called and requested addition with pathology  lab) - Follow-up BAL AFB, fungal cultures - Follow-up Blastomyces antigen, if negative will plan to stop micafungin. - I reassured to pathology for adding AFB and fungal stains.  Since only smears were sent and not cellblock this cannot be added. -Aspergillus antigen in BAL  negative as such will hold off on starting vori -I ordered AFB and fungal blood cultures as well. Microbiology:   Antibiotics: Cefepime 7/5-//time Ceftriaxone 7/9 Vancomycin 7/5-present Micafungin 7/18-present Meropenem 7/11-present Cultures: Blood 7/5 no growth 7/10 new growth  Urine  Other   SUBJECTIVE: Resting in bed  Interval: Tmax 103.2 overnight   Review of Systems: Review of Systems  All other systems reviewed and are negative.    Scheduled Meds:  Chlorhexidine Gluconate Cloth  6 each Topical Daily   citalopram  20 mg Oral Daily   [START ON 08/10/2022] furosemide  40 mg Intravenous Daily   insulin aspart  0-20 Units Subcutaneous TID WC   insulin aspart  0-5 Units Subcutaneous QHS   insulin aspart  5 Units Subcutaneous TID WC   insulin glargine-yfgn  10 Units Subcutaneous BID   pantoprazole  40 mg Oral BID   Continuous Infusions:  meropenem (MERREM) IV Stopped (08/09/22 0715)   micafungin (MYCAMINE) 100 mg in sodium chloride 0.9 % 100 mL IVPB Stopped (08/08/22 2200)   vancomycin Stopped (08/08/22 1710)   PRN Meds:.acetaminophen **OR** acetaminophen, albuterol, fentaNYL (SUBLIMAZE) injection, guaiFENesin, ondansetron **OR** ondansetron (ZOFRAN) IV, mouth rinse, oxyCODONE, traZODone No Known Allergies  OBJECTIVE: Vitals:   08/09/22 0900 08/09/22 0912 08/09/22 1000 08/09/22 1134  BP: (!) 136/59  (!) 144/59   Pulse: (!) 118 (!) 108 (!) 104 92  Resp: (!) 31 (!) 27 (!) 28 (!) 27  Temp: 100.1 F (37.8 C)  98.3 F (36.8 C)  TempSrc: Oral   Oral  SpO2: 100% 99% 92% 94%  Weight:      Height:       Body mass index is 22.87 kg/m.  Physical Exam Constitutional:      Appearance:  Normal appearance.  HENT:     Head: Normocephalic and atraumatic.     Right Ear: Tympanic membrane normal.     Left Ear: Tympanic membrane normal.     Nose: Nose normal.     Mouth/Throat:     Mouth: Mucous membranes are moist.  Eyes:     Extraocular Movements: Extraocular movements intact.     Conjunctiva/sclera: Conjunctivae normal.     Pupils: Pupils are equal, round, and reactive to light.  Cardiovascular:     Rate and Rhythm: Normal rate and regular rhythm.     Heart sounds: No murmur heard.    No friction rub. No gallop.  Pulmonary:     Effort: Pulmonary effort is normal.     Breath sounds: Normal breath sounds.  Abdominal:     General: Abdomen is flat.     Palpations: Abdomen is soft.  Musculoskeletal:        General: Normal range of motion.  Skin:    General: Skin is warm and dry.  Neurological:     General: No focal deficit present.     Mental Status: She is alert and oriented to person, place, and time.  Psychiatric:        Mood and Affect: Mood normal.       Lab Results Lab Results  Component Value Date   WBC 1.0 (LL) 08/09/2022   HGB 9.8 (L) 08/09/2022   HCT 27.6 (L) 08/09/2022   MCV 95.5 08/09/2022   PLT 24 (LL) 08/09/2022    Lab Results  Component Value Date   CREATININE 0.39 (L) 08/09/2022   BUN 9 08/09/2022   NA 130 (L) 08/09/2022   K 4.2 08/09/2022   CL 95 (L) 08/09/2022   CO2 26 08/09/2022    Lab Results  Component Value Date   ALT 18 08/08/2022   AST 31 08/08/2022   ALKPHOS 87 08/08/2022   BILITOT 0.9 08/08/2022        Danelle Earthly, MD [facility] 08/09/2022, 12:45 PM   I have personally spent 70 minutes involved in face-to-face and non-face-to-face activities for this patient on the day of the visit. Professional time spent includes the following activities: Preparing to see the patient (review of tests), Obtaining and/or reviewing separately obtained history  (admission/discharge record), Performing a medically appropriate examination and/or evaluation , Ordering medications/tests/procedures, referring and communicating with other health care professionals, Documenting clinical information in the EMR, Independently interpreting results (not separately reported), Communicating results to the patient/family/caregiver, Counseling and educating the patient/family/caregiver and Care coordination (not separately reported).

## 2022-08-09 NOTE — Evaluation (Signed)
Physical Therapy Evaluation Patient Details Name: Amanda Duarte MRN: [id number] DOB: 11-Aug-1955 Today's Date: 08/09/2022  History of Present Illness  Pt is a 67 y/o F presenting to ED on 7/5 from oncology office with fever, CXR with mild peribronchial thickening, UA without evidence of infection, admitted for neutropenic fever. PMH includes HTN, HLD, insulin-dependent DM2, myelodysplasia syndrome and chemotherapy 2x/week, chronic thrombocytopenia and neutropenia.  Clinical Impression  Pt admitted with above diagnosis.  Pt currently with functional limitations due to the deficits listed below (see PT Problem List). Pt will benefit from acute skilled PT to increase their independence and safety with mobility to allow discharge.     The patient appears with lower energy per RN. Patient did  mobilize and transfer to and from [facility], required support to balance when stepping,  One balance loss. Patient plans to Dc to stay with daughter.  Continue PT for  progressive mobility.        Assistance Recommended at Discharge Frequent or constant Supervision/Assistance  If plan is discharge home, recommend the following:  Can travel by private vehicle  A little help with walking and/or transfers;A little help with bathing/dressing/bathroom;Assistance with cooking/housework;Help with stairs or ramp for entrance        Equipment Recommendations Rolling walker (2 wheels)  Recommendations for Other Services       Functional Status Assessment Patient has had a recent decline in their functional status and demonstrates the ability to make significant improvements in function in a reasonable and predictable amount of time.     Precautions / Restrictions Precautions Precautions: Fall Precaution Comments: watch vitals, protective precautions Restrictions Weight Bearing Restrictions: No      Mobility  Bed Mobility   Bed Mobility: Supine to Sit, Sit to Supine     Supine to sit: Min assist Sit to  supine: Min assist   General bed mobility comments: assist for BLE's and trunk to  sit upright    Transfers Overall transfer level: Needs assistance Equipment used: 1 person hand held assist Transfers: Sit to/from Stand, Bed to chair/wheelchair/BSC Sit to Stand: Min assist, +2 physical assistance, +2 safety/equipment   Step pivot transfers: Min assist, +2 safety/equipment, +2 physical assistance, Mod assist       General transfer comment: 1 LOB when stepping to transfer    Ambulation/Gait                  Stairs            Wheelchair Mobility     Tilt Bed    Modified Rankin (Stroke Patients Only)       Balance Overall balance assessment: Needs assistance Sitting-balance support: Feet supported Sitting balance-Leahy Scale: Fair     Standing balance support: During functional activity, Single extremity supported Standing balance-Leahy Scale: Poor                               Pertinent Vitals/Pain Pain Assessment Pain Assessment: No/denies pain    Home Living Family/patient expects to be discharged to:: Private residence Living Arrangements: Other relatives Available Help at Discharge: Family;Available 24 hours/day Type of Home: House Home Access: Stairs to enter   [employer] of Steps: 3-5   Home Layout: Two level;Able to live on main level with bedroom/bathroom Home Equipment: None      Prior Function Prior Level of Function : Independent/Modified Independent  Mobility Comments: min guard A for stairs PTA, no AD use       Hand Dominance   Dominant Hand: Right    Extremity/Trunk Assessment   Upper Extremity Assessment Upper Extremity Assessment: Generalized weakness    Lower Extremity Assessment Lower Extremity Assessment: Generalized weakness    Cervical / Trunk Assessment Cervical / Trunk Assessment: Normal  Communication   Communication: Prefers language other than  [place];Interpreter utilized;Other (comment)  Cognition Arousal/Alertness: Awake/alert Behavior During Therapy: Flat affect Overall Cognitive Status: Within Functional Limits for tasks assessed                                 General Comments: sluggish to respond        General Comments      Exercises     Assessment/Plan    PT Assessment Patient needs continued PT services  PT Problem List Decreased strength;Decreased mobility;Decreased safety awareness;Decreased activity tolerance;Cardiopulmonary status limiting activity;Decreased balance;Decreased knowledge of use of DME       PT Treatment Interventions DME instruction;Therapeutic activities;Gait training;Therapeutic exercise;Functional mobility training    PT Goals (Current goals can be found in the Care Plan section)  Acute Rehab PT Goals Patient Stated Goal: agreed to OOB PT Goal Formulation: With patient Time For Goal Achievement: 08/23/22 Potential to Achieve Goals: Good    Frequency Min 1X/week     Co-evaluation               AM-PAC PT "6 Clicks" Mobility  Outcome Measure Help needed turning from your back to your side while in a flat bed without using bedrails?: A Little Help needed moving from lying on your back to sitting on the side of a flat bed without using bedrails?: A Little Help needed moving to and from a bed to a chair (including a wheelchair)?: A Lot Help needed standing up from a chair using your arms (e.g., wheelchair or bedside chair)?: A Lot Help needed to walk in hospital room?: Total Help needed climbing 3-5 steps with a railing? : Total 6 Click Score: 12    End of Session   Activity Tolerance: Patient limited by fatigue Patient left: in bed;with call bell/phone within reach;with nursing/sitter in room Nurse Communication: Mobility status PT Visit Diagnosis: Unsteadiness on feet (R26.81);Difficulty in walking, not elsewhere classified (R26.2)    Time: 1439-1500 PT  Time Calculation (min) (ACUTE ONLY): 21 min   Charges:   PT Evaluation $PT Eval Low Complexity: 1 Low   PT General Charges $$ ACUTE PT VISIT: 1 Visit         Blanchard Kelch PT Acute Rehabilitation Services Office [phone] Weekend [phone]   Rada Hay 08/09/2022, 3:08 PM

## 2022-08-10 ENCOUNTER — Inpatient Hospital Stay (HOSPITAL_COMMUNITY): Payer: Medicare Other

## 2022-08-10 ENCOUNTER — Encounter (HOSPITAL_COMMUNITY): Payer: Self-pay | Admitting: Internal Medicine

## 2022-08-10 ENCOUNTER — Inpatient Hospital Stay: Payer: Medicare Other

## 2022-08-10 DIAGNOSIS — R5081 Fever presenting with conditions classified elsewhere: Secondary | ICD-10-CM | POA: Diagnosis not present

## 2022-08-10 DIAGNOSIS — D709 Neutropenia, unspecified: Secondary | ICD-10-CM | POA: Diagnosis not present

## 2022-08-10 LAB — CBC WITH DIFFERENTIAL/PLATELET
Abs Immature Granulocytes: 0.01 10*3/uL (ref 0.00–0.07)
Basophils Absolute: 0 10*3/uL (ref 0.0–0.1)
Basophils Relative: 1 %
Eosinophils Absolute: 0 10*3/uL (ref 0.0–0.5)
Eosinophils Relative: 0 %
HCT: 24.3 % — ABNORMAL LOW (ref 36.0–46.0)
Hemoglobin: 8.2 g/dL — ABNORMAL LOW (ref 12.0–15.0)
Immature Granulocytes: 1 %
Lymphocytes Relative: 58 %
Lymphs Abs: 0.5 10*3/uL — ABNORMAL LOW (ref 0.7–4.0)
MCH: 31.8 pg (ref 26.0–34.0)
MCHC: 33.7 g/dL (ref 30.0–36.0)
MCV: 94.2 fL (ref 80.0–100.0)
Monocytes Absolute: 0.2 10*3/uL (ref 0.1–1.0)
Monocytes Relative: 27 %
Neutro Abs: 0.1 10*3/uL — CL (ref 1.7–7.7)
Neutrophils Relative %: 13 %
Platelets: 6 10*3/uL — CL (ref 150–400)
RBC: 2.58 MIL/uL — ABNORMAL LOW (ref 3.87–5.11)
RDW: 15.3 % (ref 11.5–15.5)
WBC: 0.9 10*3/uL — CL (ref 4.0–10.5)
nRBC: 5.8 % — ABNORMAL HIGH (ref 0.0–0.2)

## 2022-08-10 LAB — COMPREHENSIVE METABOLIC PANEL
ALT: 14 U/L (ref 0–44)
AST: 24 U/L (ref 15–41)
Albumin: 1.8 g/dL — ABNORMAL LOW (ref 3.5–5.0)
Alkaline Phosphatase: 78 U/L (ref 38–126)
Anion gap: 10 (ref 5–15)
BUN: 8 mg/dL (ref 8–23)
CO2: 25 mmol/L (ref 22–32)
Calcium: 7.3 mg/dL — ABNORMAL LOW (ref 8.9–10.3)
Chloride: 95 mmol/L — ABNORMAL LOW (ref 98–111)
Creatinine, Ser: 0.35 mg/dL — ABNORMAL LOW (ref 0.44–1.00)
GFR, Estimated: >60 mL/min (ref >60)
Glucose, Bld: 95 mg/dL (ref 70–99)
Potassium: 3.4 mmol/L — ABNORMAL LOW (ref 3.5–5.1)
Sodium: 130 mmol/L — ABNORMAL LOW (ref 135–145)
Total Bilirubin: 0.9 mg/dL (ref 0.3–1.2)
Total Protein: 6.4 g/dL — ABNORMAL LOW (ref 6.5–8.1)

## 2022-08-10 LAB — GLUCOSE, CAPILLARY
Glucose-Capillary: 140 mg/dL — ABNORMAL HIGH (ref 70–99)
Glucose-Capillary: 224 mg/dL — ABNORMAL HIGH (ref 70–99)
Glucose-Capillary: 131 mg/dL — ABNORMAL HIGH (ref 70–99)
Glucose-Capillary: 91 mg/dL (ref 70–99)

## 2022-08-10 LAB — MAGNESIUM: Magnesium: 1.6 mg/dL — ABNORMAL LOW (ref 1.7–2.4)

## 2022-08-10 MED ORDER — AMLODIPINE BESYLATE 5 MG PO TABS
5.0000 mg | ORAL_TABLET | Freq: Every day | ORAL | Status: DC
  Filled 2022-08-10: qty 1

## 2022-08-10 MED ORDER — SODIUM CHLORIDE 0.9% IV SOLUTION: Freq: Once | INTRAVENOUS | Status: AC

## 2022-08-10 MED ORDER — LOSARTAN POTASSIUM 50 MG PO TABS
50.0000 mg | ORAL_TABLET | Freq: Every day | ORAL | Status: DC
  Filled 2022-08-10: qty 1

## 2022-08-10 MED ORDER — SODIUM CHLORIDE (PF) 0.9 % IJ SOLN
INTRAMUSCULAR | Status: AC
  Filled 2022-08-10: qty 50

## 2022-08-10 MED ORDER — IOHEXOL 300 MG/ML  SOLN
75.0000 mL | Freq: Once | INTRAMUSCULAR | Status: AC | PRN
  Administered 2022-08-10: 75 mL via INTRAVENOUS

## 2022-08-10 NOTE — Progress Notes (Signed)
PROGRESS NOTE  Amanda Duarte  DOB: Oct 11, 1955  PCP: Claiborne Rigg, NP WUX:[id number]  DOA: 07/29/2022  LOS: 12 days  Hospital Day: 13  Brief narrative: Amanda Duarte is a 67 y.o. 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   October 2023- she had azacitidine injection.  She was admitted to hospital after chemo, for severe profound pancytopenia, aspiration pneumonia, required intubation.   January 2024-started on 2 cycles of reduced azacitidine   She was also evaluted at [facility] for possible bone marrow transplant. There was concern for disease patient given worsening cytopenias. 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 June, 2024 - hospitalized, thought to be a periorbital colitis and potential ostiomeatal sinusitis based on imaging. She was given broad-spectrum bacterial therapy form of vancomycin cefepime and metronidazole.  She was seen by ophthalmology and per recommendation, patient was transferred to [facility].  Over there, she was seen by ophthalmology and ENT.  Per family's account, at [facility], she had an IND of occluded tear duct, no growth in the culture.  She was discharged from the hospital in about 3 days.  Outpatient follow-up showed patent tear ducts. Back to her chemotherapy twice a week was recently held due to worsening cytopenia.  She was referred to leukemia specialist Dr. Sharyne Richters. In the interim she developed fevers though without other focal symptoms.  7/5, seen by oncology and found to be febrile to 100.4.  Her platelets dropped to 14,000.  She was given a platelet transfusion.  Chest x-ray showed some peribronchial thickening. Sent to ED. Labs showed severe leukopenia with a white count of 1.3 and platelet 22,000 Admitted to [facility] for neutropenic fever Patient was started on empiric antibiotics.  Despite antibiotic coverage for 72 hours,  fever did not improve. 7/9, ID was consulted 7/9, CT of the orbits showed resolved right orbital cellulitis and unresolved paranasal sinus inflammation now more bilateral and symmetric 7/9, CT chest showed multifocal airspace disease with tree-in-bud and nodular opacities throughout all lobes of both lungs. 7/11, with worsening fever, PCCM was consulted to assist bronchoscopy. 7/12, underwent bronchoscopy.  Per report, copious amount of light green secretions were noted in the left lower lobe bronchus,.  Suctioned out.  No endobronchial lesions seen.  BAL subsequently resulted negative for malignant cells, negative culture, negative for pneumocystis, Aspergillus, AFB smear Subsequent chest x-rays on 7/14 and 7/16 showed increasing multifocal pneumonia and layering effusions bilaterally. PCCM considered 20 disease but on ultrasound, effusion was noted to be too small to tap.  Currently on broad-spectrum antimicrobial per ID Continues to have fever spikes  Subjective: Patient was seen and examined this morning.  Chronically sick looking elderly Hispanic female.  On 2 L oxygen by nasal cannula.  Heart rate in 130s, blood pressure in 180s.  Concentrated urine in canister. Has had multiple fever spikes in the last 24 hours up to 104.4 this morning. Not in pain currently.  No family at bedside. Labs from this morning showed platelet significantly low at 6 2 units of platelet transfusion were ordered but patient spiked fever while getting transfusion and hence could not be continued. Chart reviewed.  Assessment and plan: No fever spikes Neutropenic fever  Sepsis Due to Multifocal Pneumonia E. Coli UTI Please see above for timeline of infectious disease workup.   Patient continues to have fever spikes and it is getting more severe and frequent. ID following.  Patient is having productive cough with bloody sputum since yesterday.  Noted a plan to repeat CT chest today. Currently on antimicrobial  per ID WBC and lactate trend as below.  Repeat tomorrow Recent Labs  Lab 08/04/22 0534 08/04/22 0846 08/04/22 2229 08/05/22 0406 08/05/22 0523 08/05/22 1659 08/06/22 0316 08/07/22 0257 08/08/22 0325 08/09/22 0550 08/10/22 0307  WBC  --    < >  --  1.0*  --    < > 0.8* 1.3* 1.4* 1.0* 0.9*  LATICACIDVEN 1.7  --  3.7* 3.0* 2.4*  --   --   --   --   --   --    < > = values in this interval not displayed.   Severe thrombocytopenia Pancytopenia Myelodysplastic syndrome Platelet level significantly low at 6 today.  2 units of platelet transfusion was planned but had to be stopped because of spiking fever.  This hospital patient, patient has so far received 5 units of platelets and 2 unit pRBC. Dr. Mosetta Putt oncology following. Continue Protonix Recent Labs  Lab 08/06/22 0316 08/07/22 0257 08/08/22 0325 08/09/22 0550 08/10/22 0307  WBC 0.8* 1.3* 1.4* 1.0* 0.9*  NEUTROABS 0.1* 0.1* 0.2* 0.2* 0.1*  HGB 10.1* 9.7* 9.9* 9.8* 8.2*  HCT 29.6* 28.1* 27.0* 27.6* 24.3*  MCV 88.4 90.4 94.1 95.5 94.2  PLT 43* 32* 20* 24* 6*    Parapneumonic effusion Too little to tap per PCCM  Acute respiratory failure with hypoxia Due to multifocal pneumonia. Has crackles bilaterally, left more than right on exam today.Will see what CT chest shows. Currently also on Lasix IV 40 mg daily.  Echo 7/13 showed EF 50 to 55%, no WMA On 10 L high flow oxygen by nasal cannula Recent Labs  Lab 08/04/22 0534 08/04/22 0846 08/04/22 2229 08/05/22 0406 08/05/22 0523 08/05/22 1659 08/06/22 0316 08/07/22 0257 08/08/22 0325 08/09/22 0550 08/10/22 0307  WBC  --    < >  --  1.0*  --    < > 0.8* 1.3* 1.4* 1.0* 0.9*  LATICACIDVEN 1.7  --  3.7* 3.0* 2.4*  --   --   --   --   --   --    < > = values in this interval not displayed.   Hypertension Blood pressure in 170s and 180s this morning.   Currently on IV Lasix.  Amlodipine and losartan on hold. Resume both   Acute Metabolic Encephalopathy Elevated  ammonia level Mental status altered probably because of high fever and infection. Ammonia level is elevated but no radiographic evidence of liver cirrhosis.  Not on valproate Watch bowel regimen. Recent Labs  Lab 08/04/22 1309 08/05/22 0406 08/06/22 0817 08/07/22 0257  AMMONIA 57* 41* 55* 44*   UTI Urine culture grew E. coli, pansensitive   Hyponatremia Sodium running low.  Trend as below.  Poor oral intake Recent Labs  Lab 08/04/22 0846 08/05/22 0406 08/06/22 0316 08/07/22 0257 08/08/22 0325 08/09/22 0550 08/10/22 0307  NA 128* 132* 133* 136 132* 130* 130*   Type 2 diabetes mellitus uncontrolled with hyperglycemia A1c 11.9 in June 2024 Currently on Semglee 10 units twice daily, insulin aspart 5 units 3 times daily as well as Premeal SSI/Accu-Cheks Recent Labs  Lab 08/09/22 1115 08/09/22 1634 08/09/22 2216 08/10/22 0725 08/10/22 1149  GLUCAP 195* 155* 184* 140* 224*      Left breast cancer s/p left mastectomy, neoadjuvant chemotherapy and radiation treatment   Mobility: Needs PT eval when clinically improved  Goals of care  Code Status: Full Code     DVT prophylaxis: Avoid heparin products because of thrombocytopenia SCDs Start: 07/29/22 1726   Antimicrobials: Antimicrobials per ID Fluid: Not on fluid currently Consultants: ID, PCCM, oncology Family Communication: None at bedside  Status: Inpatient Level of care:  Stepdown   Patient from: Home Anticipated d/c to: Pending clinical course Needs to continue in-hospital care:  Remains sick.  Has high-grade fever persistently       Diet:  Diet Order             Diet Carb Modified Fluid consistency: Thin; Room service appropriate? Yes  Diet effective now                   Scheduled Meds:  sodium chloride   Intravenous Once   amLODipine  5 mg Oral Daily   Chlorhexidine Gluconate Cloth  6 each Topical Daily   furosemide  40 mg Intravenous Daily   insulin aspart  0-20 Units  Subcutaneous TID WC   insulin aspart  0-5 Units Subcutaneous QHS   insulin aspart  5 Units Subcutaneous TID WC   insulin glargine-yfgn  10 Units Subcutaneous BID   losartan  50 mg Oral Daily   pantoprazole  40 mg Oral BID    PRN meds: acetaminophen **OR** acetaminophen, albuterol, fentaNYL (SUBLIMAZE) injection, guaiFENesin, ondansetron **OR** ondansetron (ZOFRAN) IV, mouth rinse, oxyCODONE, traZODone   Infusions:   meropenem (MERREM) IV Stopped (08/10/22 0622)   micafungin (MYCAMINE) 100 mg in sodium chloride 0.9 % 100 mL IVPB Stopped (08/09/22 2248)   vancomycin Stopped (08/09/22 1728)    Antimicrobials: Anti-infectives (From admission, onward)    Start     Dose/Rate Route Frequency Ordered Stop   08/04/22 1400  meropenem (MERREM) 1 g in sodium chloride 0.9 % 100 mL IVPB        1 g 200 mL/hr over 30 Minutes Intravenous Every 8 hours 08/04/22 0915     08/02/22 1700  metroNIDAZOLE (FLAGYL) tablet 500 mg  Status:  Discontinued        500 mg Oral Every 12 hours 08/02/22 1446 08/04/22 0915   08/02/22 1600  ceFEPIme (MAXIPIME) 2 g in sodium chloride 0.9 % 100 mL IVPB  Status:  Discontinued        2 g 200 mL/hr over 30 Minutes Intravenous Every 12 hours 08/02/22 1506 08/04/22 0915   08/02/22 1600  vancomycin (VANCOREADY) IVPB 750 mg/150 mL        750 mg 150 mL/hr over 60 Minutes Intravenous Every 24 hours 08/02/22 1520     08/02/22 1000  cefTRIAXone (ROCEPHIN) 2 g in sodium chloride 0.9 % 100 mL IVPB  Status:  Discontinued        2 g 200 mL/hr over 30 Minutes Intravenous Every 24 hours 08/01/22 1841 08/02/22 1506   08/01/22 2000  micafungin (MYCAMINE) 100 mg in sodium chloride 0.9 % 100 mL IVPB        100 mg 105 mL/hr over 1 Hours Intravenous Every 24 hours 08/01/22 1835     07/30/22 1800  vancomycin (VANCOREADY) IVPB 750 mg/150 mL  Status:  Discontinued        750 mg 150 mL/hr over 60 Minutes Intravenous Every 24 hours 07/29/22 1825 07/31/22 0924   07/30/22 0500  ceFEPIme  (MAXIPIME) 2 g in sodium chloride 0.9 % 100 mL IVPB        2 g 200 mL/hr over 30 Minutes Intravenous Every 12 hours 07/29/22 1825 08/01/22 2130  07/29/22 1630  ceFEPIme (MAXIPIME) 2 g in sodium chloride 0.9 % 100 mL IVPB        2 g 200 mL/hr over 30 Minutes Intravenous  Once 07/29/22 1617 07/29/22 1818   07/29/22 1630  vancomycin (VANCOCIN) IVPB 1000 mg/200 mL premix        1,000 mg 200 mL/hr over 60 Minutes Intravenous  Once 07/29/22 1628 07/30/22 0749       Nutritional status:  Body mass index is 22.87 kg/m.          Objective: Vitals:   08/10/22 1100 08/10/22 1200  BP:  (!) 161/56  Pulse: (!) 137 (!) 125  Resp: (!) 31 (!) 31  Temp: (!) 104.4 F (40.2 C) (!) 103.5 F (39.7 C)  SpO2: 90% (!) 88%    Intake/Output Summary (Last 24 hours) at 08/10/2022 1304 Last data filed at 08/10/2022 0930 Gross per 24 hour  Intake 250 ml  Output 850 ml  Net -600 ml   Filed Weights   07/29/22 1748 08/05/22 0929 08/09/22 0500  Weight: 52.1 kg 52.1 kg 54.9 kg   Weight change:  Body mass index is 22.87 kg/m.   Physical Exam: General exam: Elderly Hispanic female.  Propped up in bed.  On 2 L oxygen by nasal cannula.  In discomfort but no particular site of pain Skin: No rashes, lesions or ulcers. HEENT: Atraumatic, normocephalic, no obvious bleeding Lungs: Tachypneic.  Crackles heard on both bases, left more than right CVS: Sinus tachycardia, no murmur GI/Abd soft, nontender, nondistended, bowel sound present CNS: Alert, awake, lethargic Psychiatry: Sad affect Extremities: No pedal edema, no calf tenderness  Data Review: I have personally reviewed the laboratory data and studies available.  F/u labs ordered Unresulted Labs (From admission, onward)     Start     Ordered   08/11/22 0500  Basic metabolic panel  Daily,   R     Question:  Specimen collection method  Answer:  Lab=Lab collect   08/10/22 1303   08/11/22 0500  CBC with Differential/Platelet  Daily,   R      Question:  Specimen collection method  Answer:  Lab=Lab collect   08/10/22 1303   08/11/22 0500  Ammonia  Tomorrow morning,   R       Question:  Specimen collection method  Answer:  Lab=Lab collect   08/10/22 1303   08/11/22 0500  Lactic acid, plasma  Tomorrow morning,   R       Question:  Specimen collection method  Answer:  Lab=Lab collect   08/10/22 1303   08/11/22 0500  Phosphorus  Tomorrow morning,   R       Question:  Specimen collection method  Answer:  Lab=Lab collect   08/10/22 1303   08/11/22 0500  Magnesium  Tomorrow morning,   R       Question:  Specimen collection method  Answer:  Lab=Lab collect   08/10/22 1303   08/09/22 1800  Acid Fast Culture with reflexed sensitivities  Once,   R        08/09/22 1800   08/07/22 2145  Aspergillus Ag, BAL/Serum  Once,   R        08/07/22 2145   08/06/22 0500  Blastomyces Antigen  Once,   R        08/05/22 1332   08/05/22 1027  Acid Fast Culture with reflexed sensitivities  RELEASE UPON ORDERING,   TIMED       Comments: Specimen A:  Pre-op diagnosis: pneumonia/BAL    08/05/22 1027            Total time spent in review of labs and imaging, patient evaluation, formulation of plan, documentation and communication with family: 65 minutes  Signed, Lorin Glass, MD [facility] 08/10/2022

## 2022-08-10 NOTE — Progress Notes (Signed)
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  #Neutropenic fevers with ANC 200 today 7/16 #Tree-in-bud opacity on CT #Dacryocystitis status post treatment/appears to be resolved on exam -CT showed confluent areas consolidation for overlying left lower lobe, tree-in-bud and nodular opacities throughout all lobes of both lungs.  Septal thickening. - Social neutropenia was presumed to be pulmonary - Patient underwent bronchoscopy with BAL on 7/12.  Pathology noted cells, pulmonary macrophages and benign bronchial cells.  Bacterial cultures with no growth so far, AFB/fungal cultures pending.  Aspergillus antigen negative.  PJP antigen negative. -I reviewed her discharge summary from 6/24 at [facility].  Noted that patient undergone bedside I&D on 6/21 without frank purulence cultures grew staph epi.  She was eventually on Vanco, cefazolin, Flagyl then switched to Unasyn and discharged on Augmentin to complete course till 6/29 along with ophthalmic erythromycin.  ENT saw her for this and patient underwent nasal endoscopy with no purulence but inflammation was noted.  Sinus cultures at time of discharge were with no growth. Recommendations: - Pt has had productive cough(bloody sputums since yesterday). Wil get CT chest for further characterization. From the infectious perspective work up has been negative so far.  Although AFB Cx are pending I have low suspicion for MAC as CT chest abd pelvis on 7/9 did not show lymphadenopathy. I communicated with Dr. Mosetta Putt in regards to MDS leading to fevers. Pending CT chest possibly stop vanc(Staph Epi per my communication with [facility], no sens sent) and micafungin(fungitell negative).  - Follow-up BAL AFB, fungal cultures - Follow-up Blastomyces antigen -Aspergillus antigen in BAL  negative as such will hold off on starting vori. Spoke Pathology yesterday  for adding AFB and fungal stains.  Since only smears were sent and not cellblock this cannot be added, also, noted that no inflammation seen on smear -I ordered AFB and fungal blood cultures as well. Microbiology:   Antibiotics: Cefepime 7/5-//time Ceftriaxone 7/9 Vancomycin 7/5-present Micafungin 7/18-present Meropenem 7/11-present Cultures: Blood 7/5 no growth 7/10 new growth     SUBJECTIVE: Resting in bed. Cough with bloody sputum. Interpreter used.   Interval: Tmax 103.4 overnight   Review of Systems: Review of Systems  All other systems reviewed and are negative.    Scheduled Meds:  sodium chloride   Intravenous Once   Chlorhexidine Gluconate Cloth  6 each Topical Daily   citalopram  20 mg Oral Daily   furosemide  40 mg Intravenous Daily   insulin aspart  0-20 Units Subcutaneous TID WC   insulin aspart  0-5 Units Subcutaneous QHS   insulin aspart  5 Units Subcutaneous TID WC   insulin glargine-yfgn  10 Units Subcutaneous BID   pantoprazole  40 mg Oral BID   Continuous Infusions:  meropenem (MERREM) IV Stopped (08/10/22 0622)   micafungin (MYCAMINE) 100 mg in sodium chloride 0.9 % 100 mL IVPB Stopped (08/09/22 2248)   vancomycin Stopped (08/09/22 1728)   PRN Meds:.acetaminophen **OR** acetaminophen,  albuterol, fentaNYL (SUBLIMAZE) injection, guaiFENesin, ondansetron **OR** ondansetron (ZOFRAN) IV, mouth rinse, oxyCODONE, traZODone No Known Allergies  OBJECTIVE: Vitals:   08/10/22  0600 08/10/22 0745 08/10/22 0852 08/10/22 0915  BP: (!) 145/62  (!) 203/63 (!) 186/78  Pulse: 92   (!) 128  Resp: 18   (!) 24  Temp:  98.8 F (37.1 C) 98.4 F (36.9 C) (!) 100.4 F (38 C)  TempSrc:  Oral Oral   SpO2: 92%  91%   Weight:      Height:       Body mass index is 22.87 kg/m.  Physical Exam Constitutional:      Appearance: Normal appearance.  HENT:     Head: Normocephalic and atraumatic.     Right Ear: Tympanic membrane normal.     Left Ear: Tympanic membrane normal.     Nose: Nose normal.     Mouth/Throat:     Mouth: Mucous membranes are moist.  Eyes:     Extraocular Movements: Extraocular movements intact.     Conjunctiva/sclera: Conjunctivae normal.     Pupils: Pupils are equal, round, and reactive to light.  Cardiovascular:     Rate and Rhythm: Normal rate and regular rhythm.     Heart sounds: No murmur heard.    No friction rub. No gallop.  Pulmonary:     Effort: Pulmonary effort is normal.     Breath sounds: Normal breath sounds.  Abdominal:     General: Abdomen is flat.     Palpations: Abdomen is soft.  Musculoskeletal:        General: Normal range of motion.  Skin:    General: Skin is warm and dry.  Neurological:     General: No focal deficit present.     Mental Status: She is alert and oriented to person, place, and time.  Psychiatric:        Mood and Affect: Mood normal.       Lab Results Lab Results  Component Value Date   WBC 0.9 (LL) 08/10/2022   HGB 8.2 (L) 08/10/2022   HCT 24.3 (L) 08/10/2022   MCV 94.2 08/10/2022   PLT 6 (LL) 08/10/2022    Lab Results  Component Value Date   CREATININE 0.35 (L) 08/10/2022   BUN 8 08/10/2022   NA 130 (L) 08/10/2022   K 3.4 (L) 08/10/2022   CL 95 (L) 08/10/2022   CO2 25 08/10/2022    Lab Results  Component Value Date   ALT 14 08/10/2022   AST 24 08/10/2022   ALKPHOS 78 08/10/2022   BILITOT 0.9 08/10/2022        Danelle Earthly, MD [facility] 08/10/2022, 10:52 AM   I have personally spent 75 minutes involved in face-to-face and non-face-to-face activities for this patient on the day of the visit. Professional time spent includes the following activities: Preparing to see the patient (review of tests), Obtaining and/or reviewing separately obtained history (admission/discharge record), Performing a medically appropriate examination and/or evaluation , Ordering medications/tests/procedures, referring and communicating with other health care professionals, Documenting clinical information in the EMR, Independently interpreting results (not separately reported), Communicating results to the patient/family/caregiver, Counseling and educating the patient/family/caregiver and Care coordination (not separately reported).

## 2022-08-11 ENCOUNTER — Other Ambulatory Visit (HOSPITAL_COMMUNITY): Payer: Self-pay

## 2022-08-11 ENCOUNTER — Other Ambulatory Visit: Payer: Self-pay

## 2022-08-11 ENCOUNTER — Other Ambulatory Visit: Payer: Self-pay | Admitting: Internal Medicine

## 2022-08-11 ENCOUNTER — Telehealth (HOSPITAL_COMMUNITY): Payer: Self-pay | Admitting: Pharmacy Technician

## 2022-08-11 ENCOUNTER — Ambulatory Visit: Payer: Medicare Other

## 2022-08-11 ENCOUNTER — Encounter: Payer: Self-pay | Admitting: Hematology

## 2022-08-11 DIAGNOSIS — R5081 Fever presenting with conditions classified elsewhere: Secondary | ICD-10-CM | POA: Diagnosis not present

## 2022-08-11 DIAGNOSIS — R599 Enlarged lymph nodes, unspecified: Secondary | ICD-10-CM

## 2022-08-11 DIAGNOSIS — D709 Neutropenia, unspecified: Secondary | ICD-10-CM | POA: Diagnosis not present

## 2022-08-11 LAB — BPAM PLATELET PHERESIS
Blood Product Expiration Date: 202407182359
Blood Product Expiration Date: 202407182359
Blood Product Expiration Date: 202407202359
ISSUE DATE / TIME: 202407170842
ISSUE DATE / TIME: 202407171601
ISSUE DATE / TIME: 202407171818
Unit Type and Rh: 1700
Unit Type and Rh: 5100
Unit Type and Rh: 6200

## 2022-08-11 LAB — PREPARE PLATELET PHERESIS
Unit division: 0
Unit division: 0
Unit division: 0

## 2022-08-11 LAB — GLUCOSE, CAPILLARY
Glucose-Capillary: 97 mg/dL (ref 70–99)
Glucose-Capillary: 176 mg/dL — ABNORMAL HIGH (ref 70–99)
Glucose-Capillary: 183 mg/dL — ABNORMAL HIGH (ref 70–99)
Glucose-Capillary: 154 mg/dL — ABNORMAL HIGH (ref 70–99)

## 2022-08-11 LAB — AMMONIA: Ammonia: 61 umol/L — ABNORMAL HIGH (ref 9–35)

## 2022-08-11 LAB — BASIC METABOLIC PANEL
Anion gap: 9 (ref 5–15)
BUN: 8 mg/dL (ref 8–23)
CO2: 29 mmol/L (ref 22–32)
Calcium: 7.2 mg/dL — ABNORMAL LOW (ref 8.9–10.3)
Chloride: 94 mmol/L — ABNORMAL LOW (ref 98–111)
Creatinine, Ser: 0.35 mg/dL — ABNORMAL LOW (ref 0.44–1.00)
GFR, Estimated: >60 mL/min (ref >60)
Glucose, Bld: 83 mg/dL (ref 70–99)
Potassium: 3.4 mmol/L — ABNORMAL LOW (ref 3.5–5.1)
Sodium: 132 mmol/L — ABNORMAL LOW (ref 135–145)

## 2022-08-11 LAB — LACTIC ACID, PLASMA: Lactic Acid, Venous: 1.4 mmol/L (ref 0.5–1.9)

## 2022-08-11 LAB — CBC WITH DIFFERENTIAL/PLATELET
Abs Immature Granulocytes: 0 10*3/uL (ref 0.00–0.07)
Basophils Absolute: 0 10*3/uL (ref 0.0–0.1)
Basophils Relative: 0 %
Eosinophils Absolute: 0 10*3/uL (ref 0.0–0.5)
Eosinophils Relative: 0 %
HCT: 25.9 % — ABNORMAL LOW (ref 36.0–46.0)
Hemoglobin: 8.6 g/dL — ABNORMAL LOW (ref 12.0–15.0)
Lymphocytes Relative: 67 %
Lymphs Abs: 0.5 10*3/uL — ABNORMAL LOW (ref 0.7–4.0)
MCH: 29.9 pg (ref 26.0–34.0)
MCHC: 33.2 g/dL (ref 30.0–36.0)
MCV: 89.9 fL (ref 80.0–100.0)
Monocytes Absolute: 0.1 10*3/uL (ref 0.1–1.0)
Monocytes Relative: 15 %
Neutro Abs: 0.1 10*3/uL — CL (ref 1.7–7.7)
Neutrophils Relative %: 13 %
Other: 5 %
Platelets: 28 10*3/uL — CL (ref 150–400)
RBC: 2.88 MIL/uL — ABNORMAL LOW (ref 3.87–5.11)
RDW: 14.5 % (ref 11.5–15.5)
WBC: 0.8 10*3/uL — CL (ref 4.0–10.5)
nRBC: 7.3 % — ABNORMAL HIGH (ref 0.0–0.2)

## 2022-08-11 LAB — PROCALCITONIN: Procalcitonin: 3.78 ng/mL

## 2022-08-11 LAB — BLASTOMYCES ANTIGEN: Blastomyces Antigen: NOT DETECTED ng/mL

## 2022-08-11 LAB — PHOSPHORUS: Phosphorus: 2.7 mg/dL (ref 2.5–4.6)

## 2022-08-11 LAB — MAGNESIUM: Magnesium: 1.4 mg/dL — ABNORMAL LOW (ref 1.7–2.4)

## 2022-08-11 MED ORDER — ISAVUCONAZONIUM SULFATE 186 MG PO CAPS
372.0000 mg | ORAL_CAPSULE | Freq: Every day | ORAL | Status: DC
  Administered 2022-08-14 – 2022-08-15 (×2): 372 mg via ORAL

## 2022-08-11 MED ORDER — CRESEMBA 186 MG PO CAPS
ORAL_CAPSULE | ORAL | 0 refills | Status: AC
  Filled 2022-08-11: qty 28, 10d supply, fill #0

## 2022-08-11 MED ORDER — MAGNESIUM SULFATE 2 GM/50ML IV SOLN
2.0000 g | Freq: Once | INTRAVENOUS | Status: AC
  Administered 2022-08-11: 2 g via INTRAVENOUS
  Filled 2022-08-11: qty 50

## 2022-08-11 MED ORDER — POTASSIUM CHLORIDE CRYS ER 20 MEQ PO TBCR
20.0000 meq | EXTENDED_RELEASE_TABLET | Freq: Once | ORAL | Status: AC
  Administered 2022-08-11: 20 meq via ORAL
  Filled 2022-08-11: qty 1

## 2022-08-11 MED ORDER — ISAVUCONAZONIUM SULFATE 186 MG PO CAPS
372.0000 mg | ORAL_CAPSULE | Freq: Three times a day (TID) | ORAL | Status: AC
  Administered 2022-08-11 – 2022-08-13 (×4): 372 mg via ORAL

## 2022-08-11 NOTE — Progress Notes (Addendum)
Amanda Duarte   DOB:20-May-1955   OZ#:[id number]   [id number]#:[id number]   Heme-onc follow-up note  Subjective: Patient remains to be on Sanders, she is on 10 L/min now, which has significant increased from 40 this week.  She was sitting in the recliner when I saw her, daughter at the bedside, she appears to be drowsy, but arousable, she was able to talk and answer most questions.  Objective:  Vitals:   08/11/22 1931 08/11/22 2023  BP: (!) 135/44   Pulse: 98   Resp: (!) 24   Temp:  98.1 F (36.7 C)  SpO2: 95%     Body mass index is 23.2 kg/m.  Intake/Output Summary (Last 24 hours) at 08/11/2022 2034 Last data filed at 08/11/2022 1900 Gross per 24 hour  Intake 1452.72 ml  Output 1300 ml  Net 152.72 ml     Sclerae unicteric  Oropharynx clear  No peripheral adenopathy  Lungs clear -- no rales or rhonchi  Heart regular rate and rhythm  Abdomen benign  MSK no focal spinal tenderness, no peripheral edema  Neuro nonfocal    CBG (last 3)  Recent Labs    08/11/22 0810 08/11/22 1208 08/11/22 1742  GLUCAP 97 176* 183*     Labs:   Urine Studies No results for input(s): "UHGB", "CRYS" in the last 72 hours.  Invalid input(s): "UACOL", "UAPR", "USPG", "UPH", "UTP", "UGL", "UKET", "UBIL", "UNIT", "UROB", "ULEU", "UEPI", "UWBC", "URBC", "UBAC", "CAST", "UCOM", "BILUA"  Basic Metabolic Panel: Recent Labs  Lab 08/05/22 0406 08/06/22 0316 08/07/22 0257 08/08/22 0325 08/09/22 0550 08/10/22 0307 08/11/22 0319  NA 132* 133* 136 132* 130* 130* 132*  K 4.1 4.3 4.0 3.8 4.2 3.4* 3.4*  CL 103 105 104 98 95* 95* 94*  CO2 21* 19* 23 24 26 25 29   GLUCOSE 222* 376* 109* 194* 105* 95 83  BUN 15 22 19 9 9 8 8   CREATININE 0.59 0.55 0.41* 0.36* 0.39* 0.35* 0.35*  CALCIUM 8.0* 7.7* 8.0* 7.4* 7.6* 7.3* 7.2*  MG 2.3 2.2 1.8 1.4* 1.8 1.6* 1.4*  PHOS 2.2* 2.0* 3.1 3.4  --   --  2.7   GFR Estimated Creatinine Clearance: 51.5 mL/min (A) (by C-G formula based on SCr of 0.35 mg/dL (L)). Liver Function  Tests: Recent Labs  Lab 08/05/22 0406 08/06/22 0316 08/07/22 0257 08/08/22 0325 08/10/22 0307  AST 18 14* 14* 31 24  ALT 13 12 11 18 14   ALKPHOS 58 63 61 87 78  BILITOT 0.7 0.9 0.7 0.9 0.9  PROT 7.2 6.6 6.9 6.8 6.4*  ALBUMIN 2.3* 2.1* 2.1* 2.0* 1.8*   No results for input(s): "LIPASE", "AMYLASE" in the last 168 hours.  Recent Labs  Lab 08/05/22 0406 08/06/22 0817 08/07/22 0257 08/11/22 0738  AMMONIA 41* 55* 44* 61*   Coagulation profile No results for input(s): "INR", "PROTIME" in the last 168 hours.  CBC: Recent Labs  Lab 08/07/22 0257 08/08/22 0325 08/09/22 0550 08/10/22 0307 08/11/22 0738  WBC 1.3* 1.4* 1.0* 0.9* 0.8*  NEUTROABS 0.1* 0.2* 0.2* 0.1* 0.1*  HGB 9.7* 9.9* 9.8* 8.2* 8.6*  HCT 28.1* 27.0* 27.6* 24.3* 25.9*  MCV 90.4 94.1 95.5 94.2 89.9  PLT 32* 20* 24* 6* 28*   Cardiac Enzymes: No results for input(s): "CKTOTAL", "CKMB", "CKMBINDEX", "TROPONINI" in the last 168 hours. BNP: Invalid input(s): "POCBNP" CBG: Recent Labs  Lab 08/10/22 1644 08/10/22 2141 08/11/22 0810 08/11/22 1208 08/11/22 1742  GLUCAP 131* 91 97 176* 183*   D-Dimer No results for  input(s): "DDIMER" in the last 72 hours. Hgb A1c No results for input(s): "HGBA1C" in the last 72 hours.  Lipid Profile No results for input(s): "CHOL", "HDL", "LDLCALC", "TRIG", "CHOLHDL", "LDLDIRECT" in the last 72 hours. Thyroid function studies No results for input(s): "TSH", "T4TOTAL", "T3FREE", "THYROIDAB" in the last 72 hours.  Invalid input(s): "FREET3"  Anemia work up No results for input(s): "VITAMINB12", "FOLATE", "FERRITIN", "TIBC", "IRON", "RETICCTPCT" in the last 72 hours.  Microbiology Recent Results (from the past 240 hour(s))  Culture, blood (Routine X 2) w Reflex to ID Panel     Status: None   Collection Time: 08/04/22  5:34 AM   Specimen: BLOOD  Result Value Ref Range Status   Specimen Description   Final    BLOOD BLOOD RIGHT HAND Performed at [facility], [address] [place], [place] [address]    Special Requests   Final    BOTTLES DRAWN AEROBIC ONLY Blood Culture adequate volume Performed at [facility], [address] [place], [place] [address]    Culture   Final    NO GROWTH 5 DAYS Performed at [facility], [address], [place], [place] [address]    Report Status 08/09/2022 FINAL  Final  Culture, blood (Routine X 2) w Reflex to ID Panel     Status: None   Collection Time: 08/04/22  5:34 AM   Specimen: BLOOD  Result Value Ref Range Status   Specimen Description   Final    BLOOD RIGHT WRIST Performed at [facility], [address], [place], [place] [address]    Special Requests   Final    BOTTLES DRAWN AEROBIC ONLY Blood Culture adequate volume Performed at [facility], [address], [place], [place] [address]    Culture   Final    NO GROWTH 5 DAYS Performed at [facility], [address], [place], [place] [address]    Report Status 08/09/2022 FINAL  Final  Expectorated Sputum Assessment w Gram Stain, Rflx to Resp Cult     Status: None   Collection Time: 08/04/22  7:44 PM   Specimen: Expectorated Sputum  Result Value Ref Range Status   Specimen Description EXPECTORATED SPUTUM  Final   Special Requests NONE  Final   Sputum evaluation   Final    Sputum specimen not acceptable for testing.  Please recollect.   NOTIFIED JEREMY RN Performed at [facility], [address] [place], [place] [address]    Report Status 08/04/2022 FINAL  Final  Fungus Culture With Stain     Status: None (Preliminary result)   Collection Time: 08/05/22 10:23 AM   Specimen: PATH Cytology Bronchial lavage; Body Fluid  Result Value Ref Range Status   Fungus Stain Final report  Final    Comment: (NOTE) Performed At: [facility] [address] [place], [place] [id number] Jolene Schimke MD UY:[id number]    Fungus (Mycology)  Culture PENDING  Incomplete   Fungal Source BRONCHIAL ALVEOLAR LAVAGE  Final    Comment: L Performed at [facility], [address] [place], [place] [address]   Acid Fast Smear (AFB)     Status: None   Collection Time: 08/05/22 10:23 AM   Specimen: PATH Cytology Bronchial lavage; Body Fluid  Result Value Ref Range Status   AFB Specimen Processing Concentration  Final   Acid Fast Smear Negative  Final    Comment: (NOTE) Performed At: [facility] [address] [place], [place] [id number] Jolene Schimke MD GM:[id number]    Source (AFB) BRONCHIAL ALVEOLAR LAVAGE  Final    Comment: L Performed at [facility], [address] [place], [place] [address]   Body fluid culture w Gram Stain     Status: None   Collection Time: 08/05/22 10:23 AM   Specimen: PATH Cytology Bronchial lavage; Body Fluid  Result Value Ref Range Status   Specimen Description   Final    BRONCHIAL ALVEOLAR LAVAGE L Performed at [facility], [address], [place], [place] [address]    Special Requests   Final    NONE Performed at [facility], [address] [place], [place] [address]    Gram Stain NO WBC SEEN NO ORGANISMS SEEN   Final   Culture   Final    NO GROWTH 3 DAYS Performed at [facility], [address], [place], [place] [address]    Report Status 08/08/2022 FINAL  Final  Fungus Culture Result     Status: None   Collection Time: 08/05/22 10:23 AM  Result Value Ref Range Status   Result 1 Comment  Final    Comment: (NOTE) KOH/Calcofluor preparation:  no fungus observed. Performed At: [facility] [address] [place], [place] [id number] Jolene Schimke MD SA:[id number]   Aspergillus Ag, BAL/Serum     Status: None   Collection Time: 08/05/22 10:32 AM   Specimen: Bronchial Alveolar Lavage  Result Value Ref Range Status   Aspergillus Ag, BAL/Serum 0.06 0.00 - 0.49 Index Final    Comment:  (NOTE) Performed At: [facility] [address] [place], [place] [id number] Jolene Schimke MD UR:[id number]   Pneumocystis smear by DFA     Status: None   Collection Time: 08/05/22 10:32 AM   Specimen: Bronchial Wash; Respiratory  Result Value Ref Range Status   Specimen Source-PJSRC BRONCHIAL ALVEOLAR LAVAGE  Final    Comment: L   Pneumocystis jiroveci Ag NEGATIVE  Final    Comment: Performed at [facility] Performed at [facility], [address] [place], [place] [address]   MRSA Next Gen by PCR, Nasal     Status: None   Collection Time: 08/05/22  7:07 PM   Specimen: Nasal Mucosa; Nasal Swab  Result Value Ref Range Status   MRSA by PCR Next Gen NOT DETECTED NOT DETECTED Final    Comment: (NOTE) The GeneXpert MRSA Assay (FDA approved for NASAL specimens only), is one component of a comprehensive MRSA colonization surveillance program. It is not intended to diagnose MRSA infection nor to guide or monitor treatment for MRSA infections. Test performance is not FDA approved in patients less than 35 years old. Performed at [facility], [address] [place], [place] [address]   Blastomyces Antigen     Status: None   Collection Time: 08/06/22  3:16 AM   Specimen: Blood  Result Value Ref Range Status   Blastomyces Antigen None Detected None Detected ng/mL Final    Comment: (NOTE) Reference Interval: None Detected Reportable Range: 0.31 ng/mL - 20.00 ng/mL Results above 20.00 ng/mL are reported as 'Positive, Above the Limit of Quantification' This test was developed and its performance characteristics determined by [employer]. It has not been cleared or approved by the FDA; however, FDA clearance or approval is not currently required for clinical use. The results are not intended to be used as the sole  means for clinical diagnosis or patient decisions.    Specimen Type SERUM  Final    Comment:  (NOTE) Performed At: [facility] [address] [place], [place] [id number] Phylis Bougie MD WU:[id number]   Fungus culture, blood     Status: None (Preliminary result)   Collection Time: 08/09/22  4:14 PM   Specimen: BLOOD  Result Value Ref Range Status   Specimen Description   Final    BLOOD SITE NOT SPECIFIED Performed at [facility], [address], [place], [place] [address]    Special Requests   Final    BOTTLES DRAWN AEROBIC ONLY Blood Culture results may not be optimal due to an inadequate volume of blood received in culture bottles Performed at [facility], [address] [place], [place] [address]    Culture   Final    NO GROWTH 1 DAY Performed at [facility], [address], [place], [place] [address]    Report Status PENDING  Incomplete      Studies:  CT CHEST W CONTRAST  Result Date: 08/10/2022 CLINICAL DATA:  Pneumonia, fever EXAM: CT CHEST WITH CONTRAST TECHNIQUE: Multidetector CT imaging of the chest was performed during intravenous contrast administration. RADIATION DOSE REDUCTION: This exam was performed according to the departmental dose-optimization program which includes automated exposure control, adjustment of the mA and/or kV according to patient size and/or use of iterative reconstruction technique. CONTRAST:  75mL OMNIPAQUE IOHEXOL 300 MG/ML  SOLN COMPARISON:  Previous studies including CT done on 08/02/2022 and chest radiograph done today FINDINGS: Cardiovascular: There are no intraluminal filling defects in central pulmonary artery branches. Evaluation of peripheral branches is limited by motion artifacts and infiltrates. There is homogeneous enhancement in thoracic aorta. Coronary artery calcifications are seen. Mediastinum/Nodes: There are enlarged lymph nodes in mediastinum and both hilar regions. Largest of the nodes is in the subcarinal region measuring 2.1 x 1.4 cm. Lungs/Pleura: Extensive  patchy alveolar infiltrates are seen in both lungs, more so on the right side. There is significant interval worsening of infiltrates in both lungs. There is almost complete opacification in both lower lobes. There is no pneumothorax. There is no significant pleural effusion. Upper Abdomen: No acute findings are seen. Musculoskeletal: No acute findings are seen. IMPRESSION: Extensive infiltrates are seen in both lungs, more so on the right side with significant interval worsening. Findings suggest worsening multifocal pneumonia. There is no significant pleural effusion. There is no pneumothorax. There are enlarged lymph nodes in both hilar regions and mediastinum, possibly suggesting reactive hyperplasia. Electronically Signed   By: Ernie Avena M.D.   On: 08/10/2022 15:39    Assessment: 67 y.o. 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  She is still on nasal cannula, 10 L/min now.  CT chest yesterday showed worsening multifocal pneumonias. -Unfortunately her BAL cultures, blood cultures all have been negative.  ID has been seeing her frequently, appreciate their help, I spoke with Dr. Thedore Mins yesterday  -Her fever is likely related to her pneumonia, but all cultures are being negative. Possible noninfectious pneumonitis? Cryptogenic organizing pneumonia? -I will discuss with Drs. Dahal and singh to see if we can try steroids on her,  maybe get pulmonoligist's input also  -I do have suspicion she may have disease progression of her MDS, possible acute leukemia phase.  I discussed her case with her hematologist Dr. Rosaria Ferries at [facility]. We feel repeating  bone marrow biopsy is reasonable at this point.  I discussed that with patient and her 2 daughters, she agrees with the biopsy. -I again discussed his overall very poor prognosis, even if we confirm she has acute leukemia, she is a poor candidate for intensive chemotherapy.   -at the end of the conversation, patient's daughter also asked if she needs to be transferred to [facility] for [facility],I told them that will be pt and her family's decision.  -I will place the bone marrow biopsy order by IR, not sure if they can do it tomorrow  -I spent a total of 50 mins for her visit today   Malachy Mood, MD 08/11/2022

## 2022-08-11 NOTE — Plan of Care (Signed)
  Problem: Education: Goal: Knowledge of General Education information will improve Description: Including pain rating scale, medication(s)/side effects and non-pharmacologic comfort measures Outcome: Progressing   Problem: Clinical Measurements: Goal: Ability to maintain clinical measurements within normal limits will improve Outcome: Progressing Goal: Cardiovascular complication will be avoided Outcome: Progressing   Problem: Activity: Goal: Risk for activity intolerance will decrease Outcome: Progressing   Problem: Nutrition: Goal: Adequate nutrition will be maintained Outcome: Progressing   

## 2022-08-11 NOTE — Progress Notes (Signed)
PROGRESS NOTE  Amanda Duarte  DOB: Mar 11, 1955  PCP: Claiborne Rigg, NP WUJ:[id number]  DOA: 07/29/2022  LOS: 13 days  Hospital Day: 14  Brief narrative: Amanda Duarte is a 67 y.o. 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   October 2023- she had azacitidine injection.  She was admitted to hospital after chemo, for severe profound pancytopenia, aspiration pneumonia, required intubation.   January 2024-started on 2 cycles of reduced azacitidine   She was also evaluted at [facility] for possible bone marrow transplant. There was concern for disease patient given worsening cytopenias. 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 June, 2024 - hospitalized, thought to be a periorbital colitis and potential ostiomeatal sinusitis based on imaging. She was given broad-spectrum bacterial therapy form of vancomycin cefepime and metronidazole.  She was seen by ophthalmology and per recommendation, patient was transferred to [facility].  Over there, she was seen by ophthalmology and ENT.  Per family's account, at [facility], she had an IND of occluded tear duct, no growth in the culture.  She was discharged from the hospital in about 3 days.  Outpatient follow-up showed patent tear ducts. Back to her chemotherapy twice a week was recently held due to worsening cytopenia.  She was referred to leukemia specialist Dr. Sharyne Richters. In the interim she developed fevers though without other focal symptoms.  7/5, seen by oncology and found to be febrile to 100.4.  Her platelets dropped to 14,000.  She was given a platelet transfusion.  Chest x-ray showed some peribronchial thickening. Sent to ED. Labs showed severe leukopenia with a white count of 1.3 and platelet 22,000 Admitted to [facility] for neutropenic fever Patient was started on empiric antibiotics.  Despite antibiotic coverage for 72 hours,  fever did not improve. 7/9, ID was consulted 7/9, CT of the orbits showed resolved right orbital cellulitis and unresolved paranasal sinus inflammation now more bilateral and symmetric 7/9, CT chest showed multifocal airspace disease with tree-in-bud and nodular opacities throughout all lobes of both lungs. 7/11, with worsening fever, PCCM was consulted to assist bronchoscopy. 7/12, underwent bronchoscopy.  Per report, copious amount of light green secretions were noted in the left lower lobe bronchus,.  Suctioned out.  No endobronchial lesions seen.  BAL subsequently resulted negative for malignant cells, negative culture, negative for pneumocystis, Aspergillus, AFB smear Subsequent chest x-rays on 7/14 and 7/16 showed increasing multifocal pneumonia and layering effusions bilaterally. PCCM considered thoracentesis but on ultrasound, effusion was noted to be too small to tap. 7/17, CT chest showed extensive infiltrates in both lungs right more than left, no significant pleural effusion.  Hilar and mediastinal lymphadenopathy suggestive of reactive hyperplasia  Currently on broad-spectrum antimicrobial per ID Continues to have fever spikes ID and oncology following  Subjective: Patient was seen and examined this afternoon.  Spiking fever again today.  1 of 2.8 this morning. On 2 L oxygen.  Heart rate in 110s, blood pressure stable. Dark concentrated urine in canister.   Not in pain currently.  No family at bedside. Labs from this morning showed clinically improved to 28  Assessment and plan: New fever spikes Neutropenic fever  Sepsis Due to Multifocal Pneumonia E. Coli UTI  Please see above for timeline of infectious disease workup.   Patient continues to have fever spikes and it is getting more severe and frequent. ID following.   Currently on multiple antimicrobials per ID  WBC and lactate trend as below.   Recent Labs  Lab 08/04/22 2229 08/05/22 0406 08/05/22 0523 08/05/22 1659  08/07/22 0257 08/08/22 0325 08/09/22 0550 08/10/22 0307 08/11/22 0319 08/11/22 0738  WBC  --  1.0*  --    < > 1.3* 1.4* 1.0* 0.9*  --  0.8*  LATICACIDVEN 3.7* 3.0* 2.4*  --   --   --   --   --  1.4  --    < > = values in this interval not displayed.   Severe thrombocytopenia Pancytopenia Myelodysplastic syndrome Platelet level was significantly low at 6 yesterday.  2 units of platelet transfusion given.  Improved to 28 today.  This hospitalization, patient has so far received 7 units of platelets and 2 unit pRBC. Dr. Mosetta Putt oncology following. Continue Protonix Recent Labs  Lab 08/07/22 0257 08/08/22 0325 08/09/22 0550 08/10/22 0307 08/11/22 0738  WBC 1.3* 1.4* 1.0* 0.9* 0.8*  NEUTROABS 0.1* 0.2* 0.2* 0.1* 0.1*  HGB 9.7* 9.9* 9.8* 8.2* 8.6*  HCT 28.1* 27.0* 27.6* 24.3* 25.9*  MCV 90.4 94.1 95.5 94.2 89.9  PLT 32* 20* 24* 6* 28*   Acute respiratory failure with hypoxia Due to multifocal pneumonia. CT chest from yesterday continues to show extensive infiltrates. Patient was also on IV Lasix 40 mg daily.  Urine looking concentrated.  Patient has poor oral intake.  I wound hold off IV Lasix for now.  Echo 7/13 showed EF 50 to 55%, no WMA On 10 L high flow oxygen by nasal cannula Recent Labs  Lab 08/04/22 2229 08/05/22 0406 08/05/22 0523 08/05/22 1659 08/07/22 0257 08/08/22 0325 08/09/22 0550 08/10/22 0307 08/11/22 0319 08/11/22 0738  WBC  --  1.0*  --    < > 1.3* 1.4* 1.0* 0.9*  --  0.8*  LATICACIDVEN 3.7* 3.0* 2.4*  --   --   --   --   --  1.4  --    < > = values in this interval not displayed.   Hypertension Blood pressure fluctuating. Previously on amlodipine and losartan.  Currently on hold.   Acute Metabolic Encephalopathy Elevated ammonia level Mental status altered probably because of high fever and infection. Ammonia level is elevated but no radiographic evidence of liver cirrhosis.  Not on valproate Watch bowel regimen. Recent Labs  Lab 08/05/22 0406  08/06/22 0817 08/07/22 0257 08/11/22 0738  AMMONIA 41* 55* 44* 61*   UTI Urine culture grew E. coli, pansensitive   Hyponatremia Sodium running low.  Trend as below.  Poor oral intake Recent Labs  Lab 08/05/22 0406 08/06/22 0316 08/07/22 0257 08/08/22 0325 08/09/22 0550 08/10/22 0307 08/11/22 0319  NA 132* 133* 136 132* 130* 130* 132*   Type 2 diabetes mellitus uncontrolled with hyperglycemia A1c 11.9 in June 2024 Currently on Semglee 10 units twice daily, insulin aspart 5 units 3 times daily as well as Premeal SSI/Accu-Cheks Recent Labs  Lab 08/10/22 1149 08/10/22 1644 08/10/22 2141 08/11/22 0810 08/11/22 1208  GLUCAP 224* 131* 91 97 176*      Left breast cancer s/p left mastectomy, neoadjuvant chemotherapy and radiation treatment   Mobility: Needs PT eval when clinically improved  Goals of care   Code Status: Full Code     DVT prophylaxis: Avoid heparin products because of thrombocytopenia SCDs Start: 07/29/22 1726   Antimicrobials: Antimicrobials per ID Fluid: Not on fluid currently Consultants: ID, PCCM, oncology Family Communication: None at bedside.  Discussed with patient daughter at bedside yesterday  Status: Inpatient Level of care:  Stepdown   Patient from: Home Anticipated d/c to: Pending clinical course Needs to continue in-hospital care:  Remains sick.  Has high-grade fever persistently       Diet:  Diet Order             Diet Carb Modified Fluid consistency: Thin; Room service appropriate? Yes  Diet effective now                   Scheduled Meds:  Chlorhexidine Gluconate Cloth  6 each Topical Daily   insulin aspart  0-20 Units Subcutaneous TID WC   insulin aspart  0-5 Units Subcutaneous QHS   insulin aspart  5 Units Subcutaneous TID WC   insulin glargine-yfgn  10 Units Subcutaneous BID   pantoprazole  40 mg Oral BID    PRN meds: acetaminophen **OR** acetaminophen, albuterol, fentaNYL (SUBLIMAZE) injection,  guaiFENesin, ondansetron **OR** ondansetron (ZOFRAN) IV, mouth rinse, oxyCODONE, traZODone   Infusions:   meropenem (MERREM) IV Stopped (08/11/22 5176)   vancomycin Stopped (08/10/22 1721)    Antimicrobials: Anti-infectives (From admission, onward)    Start     Dose/Rate Route Frequency Ordered Stop   08/04/22 1400  meropenem (MERREM) 1 g in sodium chloride 0.9 % 100 mL IVPB        1 g 200 mL/hr over 30 Minutes Intravenous Every 8 hours 08/04/22 0915     08/02/22 1700  metroNIDAZOLE (FLAGYL) tablet 500 mg  Status:  Discontinued        500 mg Oral Every 12 hours 08/02/22 1446 08/04/22 0915   08/02/22 1600  ceFEPIme (MAXIPIME) 2 g in sodium chloride 0.9 % 100 mL IVPB  Status:  Discontinued        2 g 200 mL/hr over 30 Minutes Intravenous Every 12 hours 08/02/22 1506 08/04/22 0915   08/02/22 1600  vancomycin (VANCOREADY) IVPB 750 mg/150 mL        750 mg 150 mL/hr over 60 Minutes Intravenous Every 24 hours 08/02/22 1520     08/02/22 1000  cefTRIAXone (ROCEPHIN) 2 g in sodium chloride 0.9 % 100 mL IVPB  Status:  Discontinued        2 g 200 mL/hr over 30 Minutes Intravenous Every 24 hours 08/01/22 1841 08/02/22 1506   08/01/22 2000  micafungin (MYCAMINE) 100 mg in sodium chloride 0.9 % 100 mL IVPB  Status:  Discontinued        100 mg 105 mL/hr over 1 Hours Intravenous Every 24 hours 08/01/22 1835 08/11/22 0920   07/30/22 1800  vancomycin (VANCOREADY) IVPB 750 mg/150 mL  Status:  Discontinued        750 mg 150 mL/hr over 60 Minutes Intravenous Every 24 hours 07/29/22 1825 07/31/22 0924   07/30/22 0500  ceFEPIme (MAXIPIME) 2 g in sodium chloride 0.9 % 100 mL IVPB        2 g 200 mL/hr over 30 Minutes Intravenous Every 12 hours 07/29/22 1825 08/01/22 2130   07/29/22 1630  ceFEPIme (MAXIPIME) 2 g in sodium chloride 0.9 % 100 mL IVPB        2 g 200 mL/hr over 30 Minutes Intravenous  Once 07/29/22 1617 07/29/22 1818   07/29/22 1630  vancomycin (VANCOCIN) IVPB 1000 mg/200 mL premix         1,000 mg 200 mL/hr over 60 Minutes Intravenous  Once 07/29/22 1628 07/30/22 0749       Nutritional status:  Body mass index is 23.2 kg/m.  Objective: Vitals:   08/11/22 1211 08/11/22 1349  BP:    Pulse:    Resp:    Temp: (!) 101.8 F (38.8 C) 98.4 F (36.9 C)  SpO2:      Intake/Output Summary (Last 24 hours) at 08/11/2022 1409 Last data filed at 08/11/2022 1000 Gross per 24 hour  Intake 1893.1 ml  Output 1300 ml  Net 593.1 ml   Filed Weights   08/05/22 0929 08/09/22 0500 08/11/22 0546  Weight: 52.1 kg 54.9 kg 55.7 kg   Weight change:  Body mass index is 23.2 kg/m.   Physical Exam: General exam: Elderly Hispanic female.  Propped up in bed.  On 10 L oxygen by nasal cannula.  Sweating.  Febrile Skin: No rashes, lesions or ulcers. HEENT: Atraumatic, normocephalic, no obvious bleeding Lungs: Tachypneic.  Crackles heard on both bases CVS: Sinus tachycardia, no murmur GI/Abd soft, nontender, nondistended, bowel sound present CNS: Alert, awake, lethargic Psychiatry: Sad affect Extremities: No pedal edema, no calf tenderness  Data Review: I have personally reviewed the laboratory data and studies available.  F/u labs ordered Unresulted Labs (From admission, onward)     Start     Ordered   08/11/22 1322  Procalcitonin  Add-on,   AD       References:    Procalcitonin Lower Respiratory Tract Infection AND Sepsis Procalcitonin Algorithm  Question:  Specimen collection method  Answer:  Lab=Lab collect   08/11/22 1321   08/11/22 0500  Basic metabolic panel  Daily,   R     Question:  Specimen collection method  Answer:  Lab=Lab collect   08/10/22 1303   08/11/22 0500  CBC with Differential/Platelet  Daily,   R     Question:  Specimen collection method  Answer:  Lab=Lab collect   08/10/22 1303   08/09/22 1800  Acid Fast Culture with reflexed sensitivities  Once,   R        08/09/22 1800   08/05/22 1027  Acid Fast Culture with reflexed sensitivities  RELEASE  UPON ORDERING,   TIMED       Comments: Specimen A: Pre-op diagnosis: pneumonia/BAL    08/05/22 1027            Total time spent in review of labs and imaging, patient evaluation, formulation of plan, documentation and communication with family: 45 minutes  Signed, Lorin Glass, MD [facility] 08/11/2022

## 2022-08-11 NOTE — Progress Notes (Signed)
OT Cancellation Note  Patient Details Name: Amanda Duarte MRN: [id number] DOB: Mar 12, 1955   Cancelled Treatment:    Reason Eval/Treat Not Completed: Other (comment). Pt is currently working with PT. Will follow-up at later date.     Reuben Likes, OTR/L 08/11/2022, 5:25 PM

## 2022-08-11 NOTE — Plan of Care (Signed)

## 2022-08-11 NOTE — Progress Notes (Signed)
[facility]  Date of Admission:  07/29/2022     Principal Problem:   Neutropenic fever (HCC) Active Problems:   Multifocal pneumonia   Pleural effusion on left   Pleural effusion          Assessment: 67 year old female with breast cancer, MDS complicated by severe pancytopenia after being on azacitidine with prolonged hospitalization for neutropenic failure requiring intubation/mechanical ventilation, QT prolongation over oteseconazole admission for orbital cellulitis found to have dacryocystitis status post bedside I&D with no purulence encountered, cultures growing Staph epi.  #Neutropenic fevers with ANC 100 #Tree-in-bud opacity on CT #Dacryocystitis status post treatment/appears to be resolved on exam -I reviewed her discharge summary from 6/24 at [facility].  Noted that patient undergone bedside I&D on 6/21 without frank purulence cultures grew staph epi.  She was eventually on Vanco, cefazolin, Flagyl then switched to Unasyn and discharged on Augmentin to complete course till 6/29 along with ophthalmic erythromycin.  ENT saw her for this and patient underwent nasal endoscopy with no purulence but inflammation was noted.  Sinus cultures at time of discharge were with no growth. -CT showed confluent areas consolidation for overlying left lower lobe, tree-in-bud and nodular opacities throughout all lobes of both lungs.  Septal thickening. - Source of worsening neutropenia presumed to be pulmonary - Patient underwent bronchoscopy with BAL on 7/12.  Pathology noted cells, pulmonary macrophages and benign bronchial cells.  Bacterial cultures with no growth so far, AFB/fungal cultures pending.  Aspergillus antigen negative.  PJP antigen negative.  Bronchoscopy report noted that copious green fluid was evacuated. - Per my conversation with pathology: Since only smears were sent and not cellblock this cannot be added, also, noted that no  inflammation seen on smear. -Following bronchoscopy patient had about 3 days of being afebrile and then fevers returned.  She also had bloody sputum production.  CT chest ordered 7/17 showed extensive filtrate bilateral lungs right second interval worsening, suggestive of multifocal pneumonia.  Enlarged lymph nodes in hilar and mediastinum possibly suggesting reactive hyperplasia. - Communicated with pulmonology and no plans for bronch.  Noted therapeutic bronchoscopy would only be helpful if she had a mucous plug which she does not. Recommendations: - As Fungitell is negative/histoplasma antigen, will stop micafungin - Add Cresemba given less likelihood of QTc prolongation for mold coverage.  Infectious workup so far has been benign.  Cultures have been negative.  Pathology did not show inflammation.  Taking all this into account I suspect her underlying issue is her malignancy leading to her clinical picture, possibly aseptic neutropenic pneumonia.  Monitor QTc. - Continue meropenem and micafungin for now - Follow-up BAL AFB, fungal cultures, AFB and fungal blood cultures  - Follow-up Blastomyces antigen   Microbiology:   Antibiotics: Cefepime 7/5-//time Ceftriaxone 7/9 Vancomycin 7/5-present Micafungin 7/18-present Meropenem 7/11-present Cultures: Blood 7/5 no growth 7/10 new growth     SUBJECTIVE: Resting in bed.  Reports her cough is somewhat better today.  Interpreter used.  Interval: Tmax 102.8 overnight   Review of Systems: Review of Systems  All other systems reviewed and are negative.    Scheduled Meds:  Chlorhexidine Gluconate Cloth  6 each Topical Daily   insulin aspart  0-20 Units Subcutaneous TID WC   insulin aspart  0-5 Units Subcutaneous QHS   insulin aspart  5 Units Subcutaneous TID WC   insulin glargine-yfgn  10 Units Subcutaneous BID   pantoprazole  40 mg Oral BID  Continuous Infusions:  meropenem (MERREM) IV Stopped (08/11/22 8119)   vancomycin  Stopped (08/10/22 1721)   PRN Meds:.acetaminophen **OR** acetaminophen, albuterol, fentaNYL (SUBLIMAZE) injection, guaiFENesin, ondansetron **OR** ondansetron (ZOFRAN) IV, mouth rinse, oxyCODONE, traZODone No Known Allergies  OBJECTIVE: Vitals:   08/11/22 1024 08/11/22 1200 08/11/22 1211 08/11/22 1349  BP:  (!) 162/55    Pulse:  (!) 116    Resp:  (!) 24    Temp: (!) 100.9 F (38.3 C)  (!) 101.8 F (38.8 C) 98.4 F (36.9 C)  TempSrc: Oral  Oral Oral  SpO2:  95%    Weight:      Height:       Body mass index is 23.2 kg/m.  Physical Exam Constitutional:      Appearance: Normal appearance.  HENT:     Head: Normocephalic and atraumatic.     Right Ear: Tympanic membrane normal.     Left Ear: Tympanic membrane normal.     Nose: Nose normal.     Mouth/Throat:     Mouth: Mucous membranes are moist.  Eyes:     Extraocular Movements: Extraocular movements intact.     Conjunctiva/sclera: Conjunctivae normal.     Pupils: Pupils are equal, round, and reactive to light.  Cardiovascular:     Rate and Rhythm: Normal rate and regular rhythm.     Heart sounds: No murmur heard.    No friction rub. No gallop.  Pulmonary:     Effort: Pulmonary effort is normal.     Breath sounds: Normal breath sounds.  Abdominal:     General: Abdomen is flat.     Palpations: Abdomen is soft.  Musculoskeletal:        General: Normal range of motion.  Skin:    General: Skin is warm and dry.  Neurological:     General: No focal deficit present.     Mental Status: She is alert and oriented to person, place, and time.  Psychiatric:        Mood and Affect: Mood normal.       Lab Results Lab Results  Component Value Date   WBC 0.8 (LL) 08/11/2022   HGB 8.6 (L) 08/11/2022   HCT 25.9 (L) 08/11/2022   MCV 89.9 08/11/2022   PLT 28 (LL) 08/11/2022    Lab Results  Component Value Date   CREATININE 0.35 (L) 08/11/2022   BUN 8 08/11/2022   NA 132 (L) 08/11/2022   K 3.4 (L) 08/11/2022   CL 94 (L)  08/11/2022   CO2 29 08/11/2022    Lab Results  Component Value Date   ALT 14 08/10/2022   AST 24 08/10/2022   ALKPHOS 78 08/10/2022   BILITOT 0.9 08/10/2022        Danelle Earthly, MD [facility] 08/11/2022, 1:57 PM   I have personally spent 86 minutes involved in face-to-face and non-face-to-face activities for this patient on the day of the visit. Professional time spent includes the following activities: Preparing to see the patient (review of tests), Obtaining and/or reviewing separately obtained history (admission/discharge record), Performing a medically appropriate examination and/or evaluation , Ordering medications/tests/procedures, referring and communicating with other health care professionals, Documenting clinical information in the EMR, Independently interpreting results (not separately reported), Communicating results to the patient/family/caregiver, Counseling and educating the patient/family/caregiver and Care coordination (not separately reported).

## 2022-08-11 NOTE — Telephone Encounter (Signed)
Pharmacy Patient Advocate Encounter  Received notification from [facility] that Prior Authorization for Cresemba 186MG  capsules has been APPROVED from 08/11/2022 to 11/11/2022.[name]  PA #/Case ID/Reference #: [id number]  Copay is $4.60

## 2022-08-11 NOTE — Progress Notes (Signed)
Physical Therapy Treatment Patient Details Name: Amanda Duarte MRN: [id number] DOB: [birth year] Today's Date: 08/11/2022   History of Present Illness Pt is a 67 y/o F presenting to ED on 7/5 from oncology office with fever, CXR with mild peribronchial thickening, UA without evidence of infection, admitted for neutropenic fever. PMH includes HTN, HLD, insulin-dependent DM2, myelodysplasia syndrome and chemotherapy 2x/week, chronic thrombocytopenia and neutropenia.    PT Comments  Pt seen in ICU RM # [id number] General Comments: AxO x 3 following all commands.  Used I Control and instrumentation engineer then Daughter when she arrived.  Overall feels "tired" and "not hungry". Assisted OOB required increased time.  General bed mobility comments: assist for BLE's and trunk to  sit upright plus increased time.  General transfer comment: Assisted from EOB to [facility] 1/4 turn no AD HHA and caution due to multiple lines.  Pt was able to void and have a BM. Assisted with peri care after void/BM during standing stance + 2 asisst for safety. General Gait Details: Pt tolerated amb in hallway 40 feet with walker at + 2 Min Assist.  Pt remained on 10 lts HF [place] avg 94% with HR 87 mild cough and 2/4 dyspnea.  Daughter assisted by following with recliner. Left pt in recliner with multiple pillows for comfort.   Pt plans to return home with Family support.      Assistance Recommended at Discharge Frequent or constant Supervision/Assistance  If plan is discharge home, recommend the following:  Can travel by private vehicle    A little help with walking and/or transfers;A little help with bathing/dressing/bathroom;Assistance with cooking/housework;Help with stairs or ramp for entrance      Equipment Recommendations  Rolling walker (2 wheels) (YOUTH)    Recommendations for Other Services       Precautions / Restrictions Precautions Precautions: Fall Precaution Comments: monitor vitals, protective precautions Restrictions Weight Bearing  Restrictions: No     Mobility  Bed Mobility Overal bed mobility: Needs Assistance Bed Mobility: Supine to Sit     Supine to sit: Min assist     General bed mobility comments: assist for BLE's and trunk to  sit upright plus increased time    Transfers Overall transfer level: Needs assistance Equipment used: 1 person hand held assist Transfers: Sit to/from Stand, Bed to chair/wheelchair/BSC Sit to Stand: Min assist, +2 safety/equipment           General transfer comment: Assisted from EOB to [facility] 1/4 turn no AD HHA and caution due to multiple lines.  Assisted with peri care after void/BM during standing stance + 2 asisst for safety.    Ambulation/Gait Ambulation/Gait assistance: Min assist, +2 physical assistance, +2 safety/equipment Gait Distance (Feet): 40 Feet Assistive device: Rolling walker (2 wheels) Gait Pattern/deviations: Step-to pattern, Step-through pattern Gait velocity: decreased     General Gait Details: Pt tolerated amb in hallway 40 feet with walker at + 2 Min Assist.  Pt remained on 10 lts HF [place] avg 94% wityh HR 87 mild cough and 2/4 dyspnea.  Daughter assisted by following with recliner.   Stairs             Wheelchair Mobility     Tilt Bed    Modified Rankin (Stroke Patients Only)       Balance  Cognition Arousal/Alertness: Awake/alert Behavior During Therapy: WFL for tasks assessed/performed Overall Cognitive Status: Within Functional Limits for tasks assessed                                 General Comments: AxO x 3 following all commands.  Used I Control and instrumentation engineer then Daughter when she arrived.  Overall feels "tired" and "not hungry".        Exercises      General Comments        Pertinent Vitals/Pain Pain Assessment Pain Assessment: No/denies pain    Home Living                          Prior Function            PT Goals (current  goals can now be found in the care plan section) Progress towards PT goals: Progressing toward goals    Frequency    Min 1X/week      PT Plan Current plan remains appropriate    Co-evaluation              AM-PAC PT "6 Clicks" Mobility   Outcome Measure  Help needed turning from your back to your side while in a flat bed without using bedrails?: A Little Help needed moving from lying on your back to sitting on the side of a flat bed without using bedrails?: A Little Help needed moving to and from a bed to a chair (including a wheelchair)?: A Little Help needed standing up from a chair using your arms (e.g., wheelchair or bedside chair)?: A Little Help needed to walk in hospital room?: A Lot Help needed climbing 3-5 steps with a railing? : A Lot 6 Click Score: 16    End of Session Equipment Utilized During Treatment: Gait belt Activity Tolerance: Patient limited by fatigue Patient left: in chair;with call bell/phone within reach;with family/visitor present Nurse Communication: Mobility status PT Visit Diagnosis: Unsteadiness on feet (R26.81);Difficulty in walking, not elsewhere classified (R26.2)     Time: 4696-2952 PT Time Calculation (min) (ACUTE ONLY): 28 min  Charges:    $Gait Training: 8-22 mins $Therapeutic Activity: 8-22 mins PT General Charges $$ ACUTE PT VISIT: 1 Visit                     {Milos Milligan  PTA Acute  Rehabilitation Services Office M-F          [phone]

## 2022-08-12 ENCOUNTER — Ambulatory Visit: Payer: Medicare Other

## 2022-08-12 ENCOUNTER — Inpatient Hospital Stay (HOSPITAL_COMMUNITY): Payer: Medicare Other

## 2022-08-12 ENCOUNTER — Encounter (HOSPITAL_COMMUNITY): Payer: Self-pay | Admitting: Internal Medicine

## 2022-08-12 DIAGNOSIS — R5081 Fever presenting with conditions classified elsewhere: Secondary | ICD-10-CM | POA: Diagnosis not present

## 2022-08-12 DIAGNOSIS — D709 Neutropenia, unspecified: Secondary | ICD-10-CM | POA: Diagnosis not present

## 2022-08-12 LAB — GLUCOSE, CAPILLARY
Glucose-Capillary: 127 mg/dL — ABNORMAL HIGH (ref 70–99)
Glucose-Capillary: 160 mg/dL — ABNORMAL HIGH (ref 70–99)
Glucose-Capillary: 40 mg/dL — CL (ref 70–99)
Glucose-Capillary: 49 mg/dL — ABNORMAL LOW (ref 70–99)
Glucose-Capillary: 230 mg/dL — ABNORMAL HIGH (ref 70–99)
Glucose-Capillary: 298 mg/dL — ABNORMAL HIGH (ref 70–99)

## 2022-08-12 LAB — CBC WITH DIFFERENTIAL/PLATELET
Abs Immature Granulocytes: 0.05 10*3/uL (ref 0.00–0.07)
Basophils Relative: 1 %
HCT: 22.6 % — ABNORMAL LOW (ref 36.0–46.0)
Immature Granulocytes: 4 %
Neutro Abs: 0.1 10*3/uL — CL (ref 1.7–7.7)
Neutrophils Relative %: 8 %
RBC: 2.45 MIL/uL — ABNORMAL LOW (ref 3.87–5.11)
RDW: 15 % (ref 11.5–15.5)

## 2022-08-12 LAB — BASIC METABOLIC PANEL
Anion gap: 6 (ref 5–15)
BUN: 11 mg/dL (ref 8–23)
CO2: 28 mmol/L (ref 22–32)
Calcium: 7.6 mg/dL — ABNORMAL LOW (ref 8.9–10.3)
Chloride: 96 mmol/L — ABNORMAL LOW (ref 98–111)
Creatinine, Ser: 0.39 mg/dL — ABNORMAL LOW (ref 0.44–1.00)
GFR, Estimated: >60 mL/min (ref >60)
Glucose, Bld: 88 mg/dL (ref 70–99)
Potassium: 3.8 mmol/L (ref 3.5–5.1)
Sodium: 130 mmol/L — ABNORMAL LOW (ref 135–145)

## 2022-08-12 LAB — FUNGUS CULTURE, BLOOD

## 2022-08-12 MED ORDER — NALOXONE HCL 0.4 MG/ML IJ SOLN
INTRAMUSCULAR | Status: AC
  Filled 2022-08-12: qty 1

## 2022-08-12 MED ORDER — SODIUM CHLORIDE 0.9 % IV SOLN
INTRAVENOUS | Status: AC
  Filled 2022-08-12: qty 250

## 2022-08-12 MED ORDER — FENTANYL CITRATE (PF) 100 MCG/2ML IJ SOLN
INTRAMUSCULAR | Status: AC
  Filled 2022-08-12: qty 2

## 2022-08-12 MED ORDER — FLUMAZENIL 0.5 MG/5ML IV SOLN
INTRAVENOUS | Status: AC
  Filled 2022-08-12: qty 5

## 2022-08-12 MED ORDER — MIDAZOLAM HCL 2 MG/2ML IJ SOLN
INTRAMUSCULAR | Status: AC | PRN
  Administered 2022-08-12: 1 mg via INTRAVENOUS

## 2022-08-12 MED ORDER — MIDAZOLAM HCL 2 MG/2ML IJ SOLN
INTRAMUSCULAR | Status: AC
  Filled 2022-08-12: qty 2

## 2022-08-12 MED ORDER — FENTANYL CITRATE (PF) 100 MCG/2ML IJ SOLN
INTRAMUSCULAR | Status: AC | PRN
  Administered 2022-08-12: 50 ug via INTRAVENOUS

## 2022-08-12 MED ORDER — DEXTROSE 50 % IV SOLN
25.0000 g | INTRAVENOUS | Status: AC
  Administered 2022-08-12: 25 g via INTRAVENOUS

## 2022-08-12 MED ORDER — DEXTROSE 50 % IV SOLN
INTRAVENOUS | Status: AC
  Filled 2022-08-12: qty 50

## 2022-08-12 NOTE — TOC Progression Note (Addendum)
Transition of Care [facility]) - Progression Note    Patient Details  Name: LAYLAH RIGA MRN: [id number] Date of Birth: [birth year]  Transition of Care [facility]) CM/SW Contact  Lavenia Atlas, RN Phone Number: 08/12/2022, 5:12 PM  Clinical Narrative:   Per chart review patient currently in [facility] SDU for neutropenic fever. Patient currently on 10L oxygen with unknown EDD. Received TOC consult for HHPT, RW. This RNCM spoke with patient's daughter Elane Fritz to offer choice for [facility] services. Elane Fritz chose any home health agency available to see her mother. Per [place] patient has a walker at home, this RNCM explained insurance will only pay for walker every 5 years, Elane Fritz verbalized understanding.  TOC will continue to follow for needs.       Expected Discharge Plan: Home w Home Health Services Barriers to Discharge: Continued Medical Work up  Expected Discharge Plan and Services In-house Referral: NA Discharge Planning Services: CM Consult Post Acute Care Choice: Home Health Living arrangements for the past 2 months: Single Family Home                 DME Arranged: Walker rolling                     Social Determinants of Health (SDOH) Interventions SDOH Screenings   Food Insecurity: Food Insecurity Present (07/29/2022)  Housing: Medium Risk (07/29/2022)  Transportation Needs: No Transportation Needs (07/29/2022)  Utilities: At Risk (07/29/2022)  Alcohol Screen: Low Risk  (03/17/2022)  Depression (PHQ2-9): Low Risk  (05/16/2022)  Financial Resource Strain: Medium Risk (03/17/2022)  Physical Activity: Insufficiently Active (03/17/2022)  Social Connections: Moderately Isolated (03/17/2022)  Stress: No Stress Concern Present (03/17/2022)  Tobacco Use: Low Risk  (08/09/2022)    Readmission Risk Interventions    08/12/2022    5:02 PM 08/03/2022    1:20 PM 07/11/2022    2:50 PM  Readmission Risk Prevention Plan  Transportation Screening Complete Complete Complete  PCP or Specialist Appt within 3-5 Days   Complete Complete  HRI or Home Care Consult  Complete Complete  Social Work Consult for Recovery Care Planning/Counseling  Complete Complete  Palliative Care Screening  Not Applicable Not Applicable  Medication Review Oceanographer) Complete Complete Complete  PCP or Specialist appointment within 3-5 days of discharge Complete    HRI or Home Care Consult Complete    SW Recovery Care/Counseling Consult Complete    Palliative Care Screening Not Applicable    Skilled Nursing Facility Not Applicable

## 2022-08-12 NOTE — Progress Notes (Signed)
Occupational Therapy Treatment Patient Details Name: Amanda Duarte MRN: [id number] DOB: September 03, 1955 Today's Date: 08/12/2022   History of present illness Pt is a 67 y/o F presenting to ED on 7/5 from oncology office with fever, CXR with mild peribronchial thickening, UA without evidence of infection, admitted for neutropenic fever. PMH includes HTN, HLD, insulin-dependent DM2, myelodysplasia syndrome and chemotherapy 2x/week, chronic thrombocytopenia and neutropenia.   OT comments  Pt appeared to be with increased lethargy and fatigue today. As such, she required increased time, effort, and cues for tasks, including increased cues for initiation and sequencing of tasks. Her overall movements were noted to be slower and guarded. She required increased assist for bed mobility and sit to stand using a RW. Her overall activity tolerance was compromised, as her heart rate increased up to 147 bpm with light activity. Her blood pressure was also taken and noted to be 195/63 at the end of the session (nurse informed of blood pressure). Continue OT plan of care.    Recommendations for follow up therapy are one component of a multi-disciplinary discharge planning process, led by the attending physician.  Recommendations may be updated based on patient status, additional functional criteria and insurance authorization.    Assistance Recommended at Discharge Frequent or constant Supervision/Assistance  Patient can return home with the following  A little help with walking and/or transfers;A lot of help with bathing/dressing/bathroom;Assistance with cooking/housework;Direct supervision/assist for medications management;Direct supervision/assist for financial management;Assist for transportation;Help with stairs or ramp for entrance   Equipment Recommendations  BSC/3in1    Recommendations for Other Services      Precautions / Restrictions Precautions Precautions: Fall Precaution Comments: monitor vitals,  protective precautions Restrictions Weight Bearing Restrictions: No       Mobility Bed Mobility Overal bed mobility: Needs Assistance Bed Mobility: Supine to Sit     Supine to sit: Mod assist Sit to supine: Mod assist   General bed mobility comments: required increased time and effort, as well as cues for initiation and sequencing, given fatigue and lethargy    Transfers Overall transfer level: Needs assistance Equipment used: Rolling walker (2 wheels) Transfers: Sit to/from Stand Sit to Stand: Mod assist                     ADL either performed or assessed with clinical judgement   ADL Overall ADL's : Needs assistance/impaired Eating/Feeding: Set up;Sitting Eating/Feeding Details (indicate cue type and reason): Based on clinical judgement, if the pt is more alert and able to perform task Grooming: Sitting;Minimal assistance Grooming Details (indicate cue type and reason): based on clinical judgement         Upper Body Dressing : Moderate assistance;Sitting   Lower Body Dressing: Maximal assistance;Sitting/lateral leans Lower Body Dressing Details (indicate cue type and reason): pt limited by lethargy and fatigue                      Cognition Arousal/Alertness: Lethargic Behavior During Therapy: Flat affect Overall Cognitive Status: Difficult to assess          General Comments: Pt noted to be with lethargy and flat affect. She required intermittent repetition of prompts. Delayed initiation noted                   Pertinent Vitals/ Pain       Pain Assessment Pain Assessment: Faces Pain Score: 0-No pain         Frequency  Min 1X/week  Progress Toward Goals  OT Goals(current goals can now be found in the care plan section)     Acute Rehab OT Goals OT Goal Formulation: With patient Time For Goal Achievement: 08/06/2022 Potential to Achieve Goals:  (Guarded this date)  Plan         AM-PAC OT "6 Clicks" Daily  Activity     Outcome Measure   Help from another person eating meals?: A Little Help from another person taking care of personal grooming?: A Little Help from another person toileting, which includes using toliet, bedpan, or urinal?: A Lot Help from another person bathing (including washing, rinsing, drying)?: A Lot Help from another person to put on and taking off regular upper body clothing?: A Lot Help from another person to put on and taking off regular lower body clothing?: A Lot 6 Click Score: 14    End of Session Equipment Utilized During Treatment: Gait belt;Rolling walker (2 wheels);Oxygen  OT Visit Diagnosis: Unsteadiness on feet (R26.81);Muscle weakness (generalized) (M62.81)   Activity Tolerance Patient limited by lethargy;Patient limited by fatigue   Patient Left in bed;with call bell/phone within reach;with bed alarm set;with family/visitor present   Nurse Communication Other (comment) (Nurse cleared pt for participation in the session. Nurse informed of pt's elevated blood pressure at the end of the session)        Time: 1640-1700 OT Time Calculation (min): 20 min  Charges: OT General Charges $OT Visit: 1 Visit OT Treatments $Therapeutic Activity: 8-22 mins     Reuben Likes, OTR/L 08/12/2022, 5:36 PM

## 2022-08-12 NOTE — Progress Notes (Signed)
Referring Physician(s): Feng,Y  Supervising Physician: Marliss Coots  Patient Status:  [facility] - In-pt  Chief Complaint:  Pancytopenia, pneumonia, UTI, fever, weakness  Subjective: Pt known to IR team from BM bx on 10/25/21 and 11/03/21. She is a 67 yo female with PMH HTN, DM, left breast cancer, prior CVA, MDS with pancytopenia admitted with pneumonia; latest cx neg; request received from oncology now for BM bx to r/o disease progression of MDS. Pt remains febrile with tachycardia, WBC 1.1, hgb 7.8, plts 19k. She currently denies HA,CP, abd/back pain,N/V or bleeding. She does have some dyspnea.    Past Medical History:  Diagnosis Date   Cancer (HCC) 03/2017   left breast cancer   Diabetes mellitus without complication (HCC)    Hyperlipidemia    Hypertension    Malignant neoplasm of left breast (HCC)    Stroke (cerebrum) (HCC)    Tibial plateau fracture, left    04-13-17 had ORIF   Past Surgical History:  Procedure Laterality Date   BRONCHIAL WASHINGS  08/05/2022   Procedure: BRONCHIAL WASHINGS;  Surgeon: Oretha Milch, MD;  Location: [facility];  Service: Cardiopulmonary;;   MASTECTOMY Left 2019   MASTECTOMY MODIFIED RADICAL Left 11/20/2017   Procedure: LEFT MODIFIED RADICAL MASTECTOMY;  Surgeon: Claud Kelp, MD;  Location: [facility];  Service: General;  Laterality: Left;   ORIF TIBIA PLATEAU Left 04/13/2017   Procedure: OPEN REDUCTION INTERNAL FIXATION (ORIF) TIBIAL PLATEAU;  Surgeon: Roby Lofts, MD;  Location: [facility] OR;  Service: Orthopedics;  Laterality: Left;   PORT-A-CATH REMOVAL Right 11/20/2017   Procedure: REMOVAL PORT-A-CATH;  Surgeon: Claud Kelp, MD;  Location: [facility] OR;  Service: General;  Laterality: Right;   PORTACATH PLACEMENT Right 05/31/2017   Procedure: INSERTION PORT-A-CATH;  Surgeon: Claud Kelp, MD;  Location: [facility];  Service: General;  Laterality: Right;   VIDEO BRONCHOSCOPY N/A 08/05/2022   Procedure: VIDEO BRONCHOSCOPY WITHOUT  FLUORO;  Surgeon: Oretha Milch, MD;  Location: [facility];  Service: Cardiopulmonary;  Laterality: N/A;      Allergies: Patient has no known allergies.  Medications: Prior to Admission medications   Medication Sig Start Date End Date Taking? Authorizing Provider  acetaminophen (TYLENOL) 325 MG tablet Take 650 mg by mouth every 4 (four) hours as needed for moderate pain.   Yes [provider]  amLODipine (NORVASC) 5 MG tablet Take 1 tablet (5 mg total) by mouth daily. For blood pressure 05/16/22  Yes Claiborne Rigg, NP  atorvastatin (LIPITOR) 40 MG tablet Take 1 tablet (40 mg total) by mouth daily. For cholesterol 01/04/22  Yes Claiborne Rigg, NP  Continuous Glucose Receiver (FREESTYLE LIBRE READER) DEVI Monitor blood glucose levels 5-6 times per day E11.65 05/16/22  Yes Claiborne Rigg, NP  Continuous Glucose Sensor (FREESTYLE LIBRE 2 SENSOR) MISC Monitor blood glucose levels 5-6 times per day E11.65 05/16/22  Yes Claiborne Rigg, NP  erythromycin ophthalmic ointment Place 1 Application into the right eye 2 (two) times daily.   Yes [provider]  fenofibrate (TRICOR) 145 MG tablet Take 1 tablet (145 mg total) by mouth daily. For cholesterol 01/04/22  Yes Claiborne Rigg, NP  fluticasone (FLONASE) 50 MCG/ACT nasal spray Place 2 sprays into both nostrils daily. 01/04/22  Yes Claiborne Rigg, NP  levofloxacin (LEVAQUIN) 500 MG tablet Take 1 tablet (500 mg total) by mouth daily. 05/26/22  Yes Malachy Mood, MD  oxyCODONE (OXY IR/ROXICODONE) 5 MG immediate release tablet Take 5 mg by  mouth every 4 (four) hours as needed for severe pain. Take 0.5 to 1 tablet (2.5-5 mg total) by mouth every 4 (four) hours as needed for up to 5 days   Yes [provider]  potassium chloride SA (KLOR-CON M) 20 MEQ tablet Take 1 tablet (20 mEq total) by mouth daily. 01/04/22  Yes Claiborne Rigg, NP  Blood Glucose Monitoring Suppl (ONETOUCH VERIO) w/Device KIT Use to check blood sugar  three times daily. E11.65 07/08/22   Hoy Register, MD  Blood Pressure Monitor DEVI Please provide patient with insurance approved blood pressure monitor 01/14/19   Claiborne Rigg, NP  carboxymethylcellulose (REFRESH PLUS) 0.5 % SOLN Place 1 drop into the right eye 4 (four) times daily.    [provider]  ceFEPIme 2 g in sodium chloride 0.9 % 100 mL Inject 2 g into the vein every 12 (twelve) hours. Patient not taking: Reported on 07/29/2022 07/13/22   Leatha Gilding, MD  citalopram (CELEXA) 20 MG tablet Take 1 tablet (20 mg total) by mouth daily. For depression 01/04/22   Claiborne Rigg, NP  glucose blood test strip Use to check blood sugar three times daily. E11.65 07/08/22   Hoy Register, MD  HUMALOG KWIKPEN 100 UNIT/ML KwikPen Inject 10 Units into the skin 3 (three) times daily. 05/19/22   Hoy Register, MD  Insulin Glargine (BASAGLAR KWIKPEN) 100 UNIT/ML Inject 20 Units into the skin 2 (two) times daily. 05/17/22   Hoy Register, MD  Insulin Pen Needle 31G X 5 MM MISC Inject 1 Device into the skin QID. For use with insulin pens 07/08/22   Hoy Register, MD  Insulin Syringe-Needle U-100 30G X 5/16" 1 ML MISC Use as directed to inject into the skin 3 times daily. 01/04/22   Claiborne Rigg, NP  Isavuconazonium Sulfate (CRESEMBA) 186 MG CAPS Take 2 capsules (372 mg total) by mouth every 8 (eight) hours for 2 days, THEN 2 capsules (372 mg total) daily for 12 days. 08/11/22 08/25/22  Danelle Earthly, MD  losartan (COZAAR) 50 MG tablet Take 50 mg by mouth daily.    [provider]  metroNIDAZOLE (FLAGYL) 500 MG tablet Take 1 tablet (500 mg total) by mouth every 12 (twelve) hours. 07/13/22   Leatha Gilding, MD  ondansetron (ZOFRAN) 8 MG tablet Take 1 tablet (8 mg total) by mouth every 8 (eight) hours as needed for nausea or vomiting. Patient not taking: Reported on 07/09/2022 11/10/21   Malachy Mood, MD  OneTouch Delica Lancets 33G MISC Use to check blood sugar three times daily.  E11.65 07/08/22   Hoy Register, MD  pantoprazole (PROTONIX) 40 MG tablet Take 1 tablet (40 mg total) by mouth daily at 6 (six) AM. For heartburn Patient taking differently: Take 40 mg by mouth daily before breakfast. 05/16/22   Claiborne Rigg, NP  prochlorperazine (COMPAZINE) 10 MG tablet Take 1 tablet (10 mg total) by mouth every 6 (six) hours as needed for nausea or vomiting. Patient not taking: Reported on 07/30/2022 11/10/21   Malachy Mood, MD  sitaGLIPtin-metformin (JANUMET) 50-1000 MG tablet Take 1 tablet by mouth 2 (two) times daily with a meal.    [provider]  vancomycin (VANCOREADY) 750 MG/150ML SOLN Inject 150 mLs (750 mg total) into the vein daily. Patient not taking: Reported on 07/29/2022 07/13/22   Leatha Gilding, MD     Vital Signs: BP (!) 127/58 (BP Location: Left Leg)   Pulse 83   Temp (!) 101.1 F (  38.4 C) (Oral) Comment: reported to rn  Resp 20   Ht 5\' 1"  (1.549 m)   Wt 119 lb 11.4 oz (54.3 kg)   SpO2 99%   BMI 22.62 kg/m   Physical Exam: appears fatigued but awake, answers questions ok; chest- bilat rhonchi, few basilar crackles, sl dim BS left base; heart- tachy but regular; abd- soft,+BS,NT; no LE edema  Imaging: CT CHEST W CONTRAST  Result Date: 08/10/2022 CLINICAL DATA:  Pneumonia, fever EXAM: CT CHEST WITH CONTRAST TECHNIQUE: Multidetector CT imaging of the chest was performed during intravenous contrast administration. RADIATION DOSE REDUCTION: This exam was performed according to the departmental dose-optimization program which includes automated exposure control, adjustment of the mA and/or kV according to patient size and/or use of iterative reconstruction technique. CONTRAST:  75mL OMNIPAQUE IOHEXOL 300 MG/ML  SOLN COMPARISON:  Previous studies including CT done on 08/02/2022 and chest radiograph done today FINDINGS: Cardiovascular: There are no intraluminal filling defects in central pulmonary artery branches. Evaluation of peripheral branches is  limited by motion artifacts and infiltrates. There is homogeneous enhancement in thoracic aorta. Coronary artery calcifications are seen. Mediastinum/Nodes: There are enlarged lymph nodes in mediastinum and both hilar regions. Largest of the nodes is in the subcarinal region measuring 2.1 x 1.4 cm. Lungs/Pleura: Extensive patchy alveolar infiltrates are seen in both lungs, more so on the right side. There is significant interval worsening of infiltrates in both lungs. There is almost complete opacification in both lower lobes. There is no pneumothorax. There is no significant pleural effusion. Upper Abdomen: No acute findings are seen. Musculoskeletal: No acute findings are seen. IMPRESSION: Extensive infiltrates are seen in both lungs, more so on the right side with significant interval worsening. Findings suggest worsening multifocal pneumonia. There is no significant pleural effusion. There is no pneumothorax. There are enlarged lymph nodes in both hilar regions and mediastinum, possibly suggesting reactive hyperplasia. Electronically Signed   By: Ernie Avena M.D.   On: 08/10/2022 15:39   DG CHEST PORT 1 VIEW  Result Date: 08/09/2022 CLINICAL DATA:  Dyspnea EXAM: PORTABLE CHEST 1 VIEW COMPARISON:  08/09/2022 FINDINGS: Bibasilar airspace infiltrates with retrocardiac opacification appears stable, likely infectious or inflammatory in nature. Small left pleural effusion is unchanged. No pneumothorax. Cardiac size within normal limits. No acute bone abnormality. Surgical clips noted within the left axilla. IMPRESSION: 1. Stable bibasilar airspace infiltrates with small left pleural effusion. Electronically Signed   By: Helyn Numbers M.D.   On: 08/09/2022 21:03   DG Chest Port 1 View  Result Date: 08/09/2022 CLINICAL DATA:  Left pleural effusion and pneumonia EXAM: PORTABLE CHEST 1 VIEW COMPARISON:  Chest x-ray dated August 07, 2022 FINDINGS: Rightward patient rotation somewhat limits evaluation.  Cardiac and mediastinal contours are unchanged. Similar heterogeneous airspace opacities. Similar hazy density of the lower left hemithorax, possibly due to layering pleural effusion. No evidence of pneumothorax. IMPRESSION: 1. Similar heterogeneous airspace opacities, concerning for infection. 2. Hazy density of the lower left hemithorax, possibly due to layering pleural effusion. Electronically Signed   By: Allegra Lai M.D.   On: 08/09/2022 08:57    Labs:  CBC: Recent Labs    08/09/22 0550 08/10/22 0307 08/11/22 0738 08/12/22 0343  WBC 1.0* 0.9* 0.8* 1.1*  HGB 9.8* 8.2* 8.6* 7.8*  HCT 27.6* 24.3* 25.9* 22.6*  PLT 24* 6* 28* 19*    COAGS: Recent Labs    10/22/21 0037 12/01/21 2313 12/03/21 0522 01/29/22 1545  INR 1.3* 1.6* 1.5* 1.3*  APTT 26  39*  --   --     BMP: Recent Labs    08/09/22 0550 08/10/22 0307 08/11/22 0319 08/12/22 0343  NA 130* 130* 132* 130*  K 4.2 3.4* 3.4* 3.8  CL 95* 95* 94* 96*  CO2 26 25 29 28   GLUCOSE 105* 95 83 88  BUN 9 8 8 11   CALCIUM 7.6* 7.3* 7.2* 7.6*  CREATININE 0.39* 0.35* 0.35* 0.39*  GFRNONAA >60 >60 >60 >60    LIVER FUNCTION TESTS: Recent Labs    08/06/22 0316 08/07/22 0257 08/08/22 0325 08/10/22 0307  BILITOT 0.9 0.7 0.9 0.9  AST 14* 14* 31 24  ALT 12 11 18 14   ALKPHOS 63 61 87 78  PROT 6.6 6.9 6.8 6.4*  ALBUMIN 2.1* 2.1* 2.0* 1.8*    Assessment and Plan: 67 yo female with PMH HTN, DM, left breast cancer, prior CVA, MDS with pancytopenia admitted with pneumonia; latest cx neg; request received from oncology now for BM bx to r/o disease progression of MDS. Pt remains febrile with tachycardia, WBC 1.1, hgb 7.8, plts 19k. Risks and benefits of procedure was discussed with the patient /daughter [name] including, but not limited to bleeding, infection, damage to adjacent structures or low yield requiring additional tests.  All of the questions were answered and there is agreement to proceed.  Consent signed and  in chart.  Procedure tent scheduled for this am.   Electronically Signed: D. Jeananne Rama, PA-C 08/12/2022, 9:56 AM   I spent a total of 20 minutes at the the patient's bedside AND on the patient's hospital floor or unit, greater than 50% of which was counseling/coordinating care for CT guided bone marrow biopsy    Patient ID: ALA KRATZ, female   DOB: 1955-09-11, 67 y.o.   MRN: [id number]

## 2022-08-12 NOTE — Procedures (Signed)
Interventional Radiology Procedure Note  Procedure: CT guided aspirate and core biopsy of right iliac bone  Complications: None  Recommendations: - Bedrest supine x 1 hrs - Hydrocodone PRN  Pain - Follow biopsy results   Dylan Suttle, MD   

## 2022-08-12 NOTE — Progress Notes (Signed)
PROGRESS NOTE  PERSEPHONE Duarte GMW:[id number] DOB: 01/18/1956 DOA: 07/29/2022 PCP: Claiborne Rigg, NP   LOS: 14 days   Brief Narrative / Interim history: Amanda Duarte is a 67 y.o. 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 She is also febrile and being treated for multifocal pneumonia.   Significant events: October 2023- she had azacitidine injection.  She was admitted to hospital after chemo, for severe profound pancytopenia, aspiration pneumonia, required intubation.   January 2024-started on 2 cycles of reduced azacitidine   She was also evaluted at [facility] for possible bone marrow transplant. There was concern for disease patient given worsening cytopenias. 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 June, 2024 - hospitalized, thought to be a periorbital colitis and potential ostiomeatal sinusitis based on imaging. She was given broad-spectrum bacterial therapy form of vancomycin cefepime and metronidazole.  She was seen by ophthalmology and per recommendation, patient was transferred to [facility].  Over there, she was seen by ophthalmology and ENT.  Per family's account, at [facility], she had an IND of occluded tear duct, no growth in the culture.  She was discharged from the hospital in about 3 days.  Outpatient follow-up showed patent tear ducts. Back to her chemotherapy twice a week was recently held due to worsening cytopenia.  She was referred to leukemia specialist Dr. Sharyne Richters. In the interim she developed fevers though without other focal symptoms.   7/5, seen by oncology and found to be febrile to 100.4.  Her platelets dropped to 14,000.  She was given a platelet transfusion.  Chest x-ray showed some peribronchial thickening. Sent to ED. Labs showed severe leukopenia with a white count of 1.3 and platelet 22,000 Admitted to [facility] for neutropenic  fever Patient was started on empiric antibiotics.  Despite antibiotic coverage for 72 hours, fever did not improve. 7/9, ID was consulted 7/9, CT of the orbits showed resolved right orbital cellulitis and unresolved paranasal sinus inflammation now more bilateral and symmetric 7/9, CT chest showed multifocal airspace disease with tree-in-bud and nodular opacities throughout all lobes of both lungs. 7/11, with worsening fever, PCCM was consulted to assist bronchoscopy. 7/12, underwent bronchoscopy.  Per report, copious amount of light green secretions were noted in the left lower lobe bronchus,.  Suctioned out.  No endobronchial lesions seen.  BAL subsequently resulted negative for malignant cells, negative culture, negative for pneumocystis, Aspergillus, AFB smear Subsequent chest x-rays on 7/14 and 7/16 showed increasing multifocal pneumonia and layering effusions bilaterally. PCCM considered thoracentesis but on ultrasound, effusion was noted to be too small to tap. 7/17, CT chest showed extensive infiltrates in both lungs right more than left, no significant pleural effusion.  Hilar and mediastinal lymphadenopathy suggestive of reactive hyperplasia  Subjective / 24h Interval events: Doing well this morning. Feeling about the same, no significant improvement   Assesement and Plan: Principal Problem:   Neutropenic fever (HCC) Active Problems:   Multifocal pneumonia   Pleural effusion on left   Pleural effusion   Principal problem Neutropenic fever, sepsis Due to Multifocal Pneumonia, E. Coli UTI - ID following, currently on meropenem. Most recent CT scan of the chest on 7/17 with extensive bilateral infiltrates, worsened since prior imaging.  -continue antibiotics -ID and oncology following.   Active problems Pancytopenia, underlying MDS - requiring multiple transfusions, total of 8 units of platelets and 2 of pRBC - Oncology following, Plt 19, Hb  7.8  Acute respiratory failure with  hypoxia - Due to multifocal pneumonia.  Echo 7/13 showed EF 50 to 55%, no WMA. Remains on 10 L  Hypertension - Blood pressure fluctuating. Previously on amlodipine and losartan.  Currently on hold.   Acute Metabolic Encephalopathy, Elevated ammonia level - Mental status altered probably because of high fever and infection. Ammonia level is elevated but no radiographic evidence of liver cirrhosis.  Mental status clear this morning  UTI - Urine culture grew E. coli, pansensitive   Hyponatremia - Sodium running low.  Trend.  Poor oral intake  Type 2 diabetes mellitus uncontrolled with hyperglycemia - A1c 11.9 in June 2024. Currently on Semglee 10 units twice daily, insulin aspart 5 units 3 times daily as well as Premeal SSI/Accu-Cheks  CBG (last 3)  Recent Labs    08/11/22 1208 08/11/22 1742 08/11/22 2142  GLUCAP 176* 183* 154*    Scheduled Meds:  Chlorhexidine Gluconate Cloth  6 each Topical Daily   insulin aspart  0-20 Units Subcutaneous TID WC   insulin aspart  0-5 Units Subcutaneous QHS   insulin aspart  5 Units Subcutaneous TID WC   insulin glargine-yfgn  10 Units Subcutaneous BID   Isavuconazonium Sulfate  372 mg Oral Q8H   Followed by   Melene Muller ON 08/14/2022] Isavuconazonium Sulfate  372 mg Oral Daily   pantoprazole  40 mg Oral BID   Continuous Infusions:  meropenem (MERREM) IV Stopped (08/12/22 0539)   PRN Meds:.acetaminophen **OR** acetaminophen, albuterol, fentaNYL (SUBLIMAZE) injection, guaiFENesin, ondansetron **OR** ondansetron (ZOFRAN) IV, mouth rinse, oxyCODONE, traZODone  Current Outpatient Medications  Medication Instructions   acetaminophen (TYLENOL) 650 mg, Oral, Every 4 hours PRN   amLODipine (NORVASC) 5 mg, Oral, Daily, For blood pressure   atorvastatin (LIPITOR) 40 mg, Oral, Daily, For cholesterol   Basaglar KwikPen 20 Units, Subcutaneous, 2 times daily   Blood Glucose Monitoring Suppl (ONETOUCH VERIO) w/Device KIT Use to check blood sugar three times  daily. E11.65   Blood Pressure Monitor DEVI Please provide patient with insurance approved blood pressure monitor   carboxymethylcellulose (REFRESH PLUS) 0.5 % SOLN 1 drop, Right Eye, 4 times daily   ceFEPIme 2 g in sodium chloride 0.9 % 100 mL 2 g, Intravenous, Every 12 hours   citalopram (CELEXA) 20 mg, Oral, Daily, For depression   Continuous Glucose Receiver (FREESTYLE LIBRE READER) DEVI Monitor blood glucose levels 5-6 times per day E11.65   Continuous Glucose Sensor (FREESTYLE LIBRE 2 SENSOR) MISC Monitor blood glucose levels 5-6 times per day E11.65   erythromycin ophthalmic ointment 1 Application, Right Eye, 2 times daily   fenofibrate (TRICOR) 145 mg, Oral, Daily, For cholesterol   fluticasone (FLONASE) 50 MCG/ACT nasal spray 2 sprays, Each Nare, Daily   glucose blood test strip Use to check blood sugar three times daily. E11.65   HumaLOG KwikPen 10 Units, Subcutaneous, 3 times daily   Insulin Pen Needle 31G X 5 MM MISC 1 Device, Subcutaneous, 4 times daily, For use with insulin pens   Insulin Syringe-Needle U-100 30G X 5/16" 1 ML MISC Use as directed to inject into the skin 3 times daily.   Isavuconazonium Sulfate (CRESEMBA) 186 MG CAPS Take 2 capsules (372 mg total) by mouth every 8 (eight) hours for 2 days, THEN 2 capsules (372 mg total) daily for 12 days.   levofloxacin (LEVAQUIN) 500 mg, Oral, Daily   losartan (COZAAR) 50 mg, Oral, Daily   metroNIDAZOLE (FLAGYL) 500 mg, Oral, Every 12 hours   ondansetron (ZOFRAN)  8 mg, Oral, Every 8 hours PRN   OneTouch Delica Lancets 33G MISC Use to check blood sugar three times daily. E11.65   oxyCODONE (OXY IR/ROXICODONE) 5 mg, Oral, Every 4 hours PRN, Take 0.5 to 1 tablet (2.5-5 mg total) by mouth every 4 (four) hours as needed for up to 5 days   pantoprazole (PROTONIX) 40 mg, Oral, Daily, For heartburn   potassium chloride SA (KLOR-CON M) 20 MEQ tablet 20 mEq, Oral, Daily   prochlorperazine (COMPAZINE) 10 mg, Oral, Every 6 hours PRN    sitaGLIPtin-metformin (JANUMET) 50-1000 MG tablet 1 tablet, Oral, 2 times daily with meals   vancomycin (VANCOREADY) 750 mg, Intravenous, Every 24 hours    Diet Orders (From admission, onward)     Start     Ordered   08/12/22 0001  Diet NPO time specified  Diet effective midnight       Comments: For possible BM biopsy by IR   08/11/22 2048            DVT prophylaxis: SCDs Start: 07/29/22 1726   Lab Results  Component Value Date   PLT 19 (LL) 08/12/2022      Code Status: Full Code  Family Communication: no family at bedside   Status is: Inpatient Remains inpatient appropriate because: severity of illness  Level of care: Stepdown  Consultants:  Oncology ID PCCM  Objective: Vitals:   08/12/22 0302 08/12/22 0310 08/12/22 0500 08/12/22 0524  BP:  (!) 128/45  (!) 127/58  Pulse: 100 97  83  Resp: (!) 25 (!) 26  20  Temp:  98.8 F (37.1 C)    TempSrc:  Oral    SpO2: 96% 95%  99%  Weight:   54.3 kg   Height:        Intake/Output Summary (Last 24 hours) at 08/12/2022 0619 Last data filed at 08/12/2022 0539 Gross per 24 hour  Intake 710 ml  Output 300 ml  Net 410 ml   Wt Readings from Last 3 Encounters:  08/12/22 54.3 kg  07/09/22 55 kg  07/01/22 53.5 kg    Examination:  Constitutional: NAD Eyes: no scleral icterus ENMT: Mucous membranes are moist.  Neck: normal, supple Respiratory: bilateral rhonchi Cardiovascular: Regular rate and rhythm, no murmurs / rubs / gallops.  Abdomen: non distended, no tenderness. Bowel sounds positive.  Musculoskeletal: no clubbing / cyanosis.   Data Reviewed: I have independently reviewed following labs and imaging studies   CBC Recent Labs  Lab 08/08/22 0325 08/09/22 0550 08/10/22 0307 08/11/22 0738 08/12/22 0343  WBC 1.4* 1.0* 0.9* 0.8* 1.1*  HGB 9.9* 9.8* 8.2* 8.6* 7.8*  HCT 27.0* 27.6* 24.3* 25.9* 22.6*  PLT 20* 24* 6* 28* 19*  MCV 94.1 95.5 94.2 89.9 92.2  MCH 34.5* 33.9 31.8 29.9 31.8  MCHC 36.7* 35.5  33.7 33.2 34.5  RDW 17.8* 17.2* 15.3 14.5 15.0  LYMPHSABS 0.8 0.8 0.5* 0.5* 0.6*  MONOABS 0.3 0.1 0.2 0.1 0.4  EOSABS 0.0 0.0 0.0 0.0 0.0  BASOSABS 0.0 0.0 0.0 0.0 0.0    Recent Labs  Lab 08/05/22 1659 08/06/22 0316 08/06/22 0316 08/06/22 0817 08/07/22 0257 08/08/22 0325 08/09/22 0550 08/10/22 0307 08/11/22 0319 08/11/22 0738  NA  --  133*   < >  --  136 132* 130* 130* 132*  --   K  --  4.3   < >  --  4.0 3.8 4.2 3.4* 3.4*  --   CL  --  105   < >  --  104 98 95* 95* 94*  --   CO2  --  19*   < >  --  23 24 26 25 29   --   GLUCOSE  --  376*   < >  --  109* 194* 105* 95 83  --   BUN  --  22   < >  --  19 9 9 8 8   --   CREATININE  --  0.55   < >  --  0.41* 0.36* 0.39* 0.35* 0.35*  --   CALCIUM  --  7.7*   < >  --  8.0* 7.4* 7.6* 7.3* 7.2*  --   AST  --  14*  --   --  14* 31  --  24  --   --   ALT  --  12  --   --  11 18  --  14  --   --   ALKPHOS  --  63  --   --  61 87  --  78  --   --   BILITOT  --  0.9  --   --  0.7 0.9  --  0.9  --   --   ALBUMIN  --  2.1*  --   --  2.1* 2.0*  --  1.8*  --   --   MG  --  2.2   < >  --  1.8 1.4* 1.8 1.6* 1.4*  --   PROCALCITON  --   --   --   --   --   --   --   --   --  3.78  LATICACIDVEN  --   --   --   --   --   --   --   --  1.4  --   AMMONIA  --   --   --  55* 44*  --   --   --   --  61*  BNP 274.2*  --   --   --   --   --   --   --   --   --    < > = values in this interval not displayed.    ------------------------------------------------------------------------------------------------------------------ No results for input(s): "CHOL", "HDL", "LDLCALC", "TRIG", "CHOLHDL", "LDLDIRECT" in the last 72 hours.  Lab Results  Component Value Date   HGBA1C 11.9 (H) 07/10/2022   ------------------------------------------------------------------------------------------------------------------ No results for input(s): "TSH", "T4TOTAL", "T3FREE", "THYROIDAB" in the last 72 hours.  Invalid input(s): "FREET3"  Cardiac Enzymes No results  for input(s): "CKMB", "TROPONINI", "MYOGLOBIN" in the last 168 hours.  Invalid input(s): "CK" ------------------------------------------------------------------------------------------------------------------    Component Value Date/Time   BNP 274.2 (H) 08/05/2022 1659    CBG: Recent Labs  Lab 08/10/22 2141 08/11/22 0810 08/11/22 1208 08/11/22 1742 08/11/22 2142  GLUCAP 91 97 176* 183* 154*    Recent Results (from the past 240 hour(s))  Culture, blood (Routine X 2) w Reflex to ID Panel     Status: None   Collection Time: 08/04/22  5:34 AM   Specimen: BLOOD  Result Value Ref Range Status   Specimen Description   Final    BLOOD BLOOD RIGHT HAND Performed at [facility], [address] [place], [place] [address]    Special Requests   Final    BOTTLES DRAWN AEROBIC ONLY Blood Culture adequate volume Performed at [facility], [address] [place], [place] [address]    Culture  Final    NO GROWTH 5 DAYS Performed at [facility], [address] [place], [place] [address]    Report Status 08/09/2022 FINAL  Final  Culture, blood (Routine X 2) w Reflex to ID Panel     Status: None   Collection Time: 08/04/22  5:34 AM   Specimen: BLOOD  Result Value Ref Range Status   Specimen Description   Final    BLOOD RIGHT WRIST Performed at [facility], [address], [place], [place] [address]    Special Requests   Final    BOTTLES DRAWN AEROBIC ONLY Blood Culture adequate volume Performed at [facility], [address] [place], [place] [address]    Culture   Final    NO GROWTH 5 DAYS Performed at [facility], [address], [place], [place] [address]    Report Status 08/09/2022 FINAL  Final  Expectorated Sputum Assessment w Gram Stain, Rflx to Resp Cult     Status: None   Collection Time: 08/04/22  7:44 PM   Specimen: Expectorated Sputum  Result Value Ref Range Status    Specimen Description EXPECTORATED SPUTUM  Final   Special Requests NONE  Final   Sputum evaluation   Final    Sputum specimen not acceptable for testing.  Please recollect.   NOTIFIED JEREMY RN Performed at [facility], [address] [place], [place] [address]    Report Status 08/04/2022 FINAL  Final  Fungus Culture With Stain     Status: None (Preliminary result)   Collection Time: 08/05/22 10:23 AM   Specimen: PATH Cytology Bronchial lavage; Body Fluid  Result Value Ref Range Status   Fungus Stain Final report  Final    Comment: (NOTE) Performed At: [facility] [address] [place], [place] [id number] Jolene Schimke MD DG:[id number]    Fungus (Mycology) Culture PENDING  Incomplete   Fungal Source BRONCHIAL ALVEOLAR LAVAGE  Final    Comment: L Performed at [facility], [address] [place], [place] [address]   Acid Fast Smear (AFB)     Status: None   Collection Time: 08/05/22 10:23 AM   Specimen: PATH Cytology Bronchial lavage; Body Fluid  Result Value Ref Range Status   AFB Specimen Processing Concentration  Final   Acid Fast Smear Negative  Final    Comment: (NOTE) Performed At: [facility] [address] [place], [place] [id number] Jolene Schimke MD WF:[id number]    Source (AFB) BRONCHIAL ALVEOLAR LAVAGE  Final    Comment: L Performed at [facility], [address], [place], [place] [address]   Body fluid culture w Gram Stain     Status: None   Collection Time: 08/05/22 10:23 AM   Specimen: PATH Cytology Bronchial lavage; Body Fluid  Result Value Ref Range Status   Specimen Description   Final    BRONCHIAL ALVEOLAR LAVAGE L Performed at [facility], [address], [place], [place] [address]    Special Requests   Final    NONE Performed at [facility], [address], [place], [place] [address]    Gram Stain NO WBC SEEN NO ORGANISMS SEEN    Final   Culture   Final    NO GROWTH 3 DAYS Performed at [facility], [address] [place], [place] [address]    Report Status 08/08/2022 FINAL  Final  Fungus Culture Result  Status: None   Collection Time: 08/05/22 10:23 AM  Result Value Ref Range Status   Result 1 Comment  Final    Comment: (NOTE) KOH/Calcofluor preparation:  no fungus observed. Performed At: [facility] [address] [place], [place] [id number] Jolene Schimke MD WG:[id number]   Aspergillus Ag, BAL/Serum     Status: None   Collection Time: 08/05/22 10:32 AM   Specimen: Bronchial Alveolar Lavage  Result Value Ref Range Status   Aspergillus Ag, BAL/Serum 0.06 0.00 - 0.49 Index Final    Comment: (NOTE) Performed At: [facility] [address] [place], [place] [id number] Jolene Schimke MD MW:[id number]   Pneumocystis smear by DFA     Status: None   Collection Time: 08/05/22 10:32 AM   Specimen: Bronchial Wash; Respiratory  Result Value Ref Range Status   Specimen Source-PJSRC BRONCHIAL ALVEOLAR LAVAGE  Final    Comment: L   Pneumocystis jiroveci Ag NEGATIVE  Final    Comment: Performed at [facility] Performed at [facility], [address] [place], [place] [address]   MRSA Next Gen by PCR, Nasal     Status: None   Collection Time: 08/05/22  7:07 PM   Specimen: Nasal Mucosa; Nasal Swab  Result Value Ref Range Status   MRSA by PCR Next Gen NOT DETECTED NOT DETECTED Final    Comment: (NOTE) The GeneXpert MRSA Assay (FDA approved for NASAL specimens only), is one component of a comprehensive MRSA colonization surveillance program. It is not intended to diagnose MRSA infection nor to guide or monitor treatment for MRSA infections. Test performance is not FDA approved in patients less than 67 years old. Performed at [facility], [address], [place], [place] [address]   Blastomyces Antigen     Status: None    Collection Time: 08/06/22  3:16 AM   Specimen: Blood  Result Value Ref Range Status   Blastomyces Antigen None Detected None Detected ng/mL Final    Comment: (NOTE) Reference Interval: None Detected Reportable Range: 0.31 ng/mL - 20.00 ng/mL Results above 20.00 ng/mL are reported as 'Positive, Above the Limit of Quantification' This test was developed and its performance characteristics determined by [employer]. It has not been cleared or approved by the FDA; however, FDA clearance or approval is not currently required for clinical use. The results are not intended to be used as the sole means for clinical diagnosis or patient decisions.    Specimen Type SERUM  Final    Comment: (NOTE) Performed At: [facility] [address] [place], [place] [id number] Phylis Bougie MD OV:[id number]   Fungus culture, blood     Status: None (Preliminary result)   Collection Time: 08/09/22  4:14 PM   Specimen: BLOOD  Result Value Ref Range Status   Specimen Description   Final    BLOOD SITE NOT SPECIFIED Performed at [facility], [address], [place], [place] [address]    Special Requests   Final    BOTTLES DRAWN AEROBIC ONLY Blood Culture results may not be optimal due to an inadequate volume of blood received in culture bottles Performed at [facility], [address] [place], [place] [address]    Culture   Final    NO GROWTH 1 DAY Performed at [facility], [address] [place], [place] [address]    Report Status PENDING  Incomplete     Radiology Studies: No results found.  Pamella Pert, MD, PhD [facility]  Between 7 am - 7 pm I am available, please contact me via Amion (for emergencies) or Securechat (non urgent messages)  Between 7 pm - 7 am I am not available, please contact night coverage MD/APP via Amion

## 2022-08-12 NOTE — Plan of Care (Signed)
  Problem: Clinical Measurements: Goal: Will remain free from infection Outcome: Progressing   Problem: Clinical Measurements: Goal: Will remain free from infection Outcome: Progressing Goal: Diagnostic test results will improve Outcome: Progressing

## 2022-08-12 NOTE — Plan of Care (Signed)
  Problem: Education: Goal: Knowledge of General Education information will improve Description: Including pain rating scale, medication(s)/side effects and non-pharmacologic comfort measures Outcome: Progressing   Problem: Health Behavior/Discharge Planning: Goal: Ability to manage health-related needs will improve Outcome: Progressing   Problem: Elimination: Goal: Will not experience complications related to bowel motility Outcome: Progressing   Problem: Clinical Measurements: Goal: Will remain free from infection Outcome: Not Progressing Goal: Respiratory complications will improve Outcome: Not Progressing Goal: Cardiovascular complication will be avoided Outcome: Not Progressing   Problem: Activity: Goal: Risk for activity intolerance will decrease Outcome: Not Progressing

## 2022-08-12 NOTE — Progress Notes (Signed)
[facility]  Date of Admission:  07/29/2022     Principal Problem:   Neutropenic fever (HCC) Active Problems:   Multifocal pneumonia   Pleural effusion on left   Pleural effusion          Assessment: 67 year old female with breast cancer, MDS complicated by severe pancytopenia after being on azacitidine with prolonged hospitalization for neutropenic failure requiring intubation/mechanical ventilation, QT prolongation over oteseconazole admission for orbital cellulitis found to have dacryocystitis status post bedside I&D with no purulence encountered, cultures growing Staph epi(regarded as contaminant? As pt discharged on augmentin).  #Neutropenic fevers with ANC 100 #Tree-in-bud opacity on CT #Dacryocystitis status post treatment/appears to be resolved on exam -I reviewed her discharge summary from 6/24 at [facility].  Noted that patient undergone bedside I&D on 6/21 without frank purulence cultures grew staph epi.  She was eventually on Vanco, cefazolin, Flagyl then switched to Unasyn and discharged on Augmentin to complete course till 6/29 along with ophthalmic erythromycin.  ENT saw her for this and patient underwent nasal endoscopy with no purulence but inflammation was noted.  Sinus cultures at time of discharge were with no growth. -CT showed confluent areas consolidation for overlying left lower lobe, tree-in-bud and nodular opacities throughout all lobes of both lungs.  Septal thickening. - Source of worsening neutropenia presumed to be pulmonary - Patient underwent bronchoscopy with BAL on 7/12.  Pathology noted cells, pulmonary macrophages and benign bronchial cells.  Bacterial cultures with no growth so far, AFB/fungal cultures pending.  Aspergillus antigen negative.  PJP antigen negative.  Bronchoscopy report noted that copious green fluid was evacuated. - Per my conversation with pathology: Since only smears were sent and not  cellblock this cannot be added, also, noted that no inflammation seen on smear. -Following bronchoscopy patient had about 3 days of being afebrile and then fevers returned.  She also had bloody sputum production.  CT chest ordered 7/17 showed extensive filtrate bilateral lungs right second interval worsening, suggestive of multifocal pneumonia.  Enlarged lymph nodes in hilar and mediastinum possibly suggesting reactive hyperplasia. - Communicated with pulmonology and no plans for bronch.  Noted therapeutic bronchoscopy would only be helpful if she had a mucous plug which she does not. - Stopped micafungin(fungitell, histo/blasto Ag negative) and vanc(no MRSA/resistant GP organism identified) on 7/18.  Added Cresemba for possible mold coverage. Continued merrem  as ANC 100. Recommendations: - Communicated with Heme onc the plan is for repeat BM biospy to rule out AML. I think if fever curve does not trend down, AFB/fungal Cx negative to date and pt does not improve or worsens over the weekend: ok to start steroids form ID perspective. Infectious work-up has been negative so far.  - Continue Cresemba given less likelihood of QTc prolongation for mold coverage.  Infectious workup so far has been benign.  Monitor QTc. - Continue meropenem  - Follow-up BAL AFB, fungal cultures, AFB and fungal blood cultures   Dr. Luciana Axe will be covering this weekend, I will be back on service on Monday.   Microbiology:   Antibiotics: Cefepime 7/5-7/10 Ceftriaxone 7/9 Vancomycin 7/5-present Micafungin 7/18-present Meropenem 7/11-present Cultures: Blood 7/5 no growth 7/10 new growth     SUBJECTIVE: Resting in bed.   Interpreter used. Family at bedside. Pt states her cough is worse at night.   Interval: Tmax 100.8 overnight   Review of Systems: Review of Systems  All other systems reviewed and are negative.  Scheduled Meds:  Chlorhexidine Gluconate Cloth  6 each Topical Daily   insulin aspart  0-20  Units Subcutaneous TID WC   insulin aspart  0-5 Units Subcutaneous QHS   insulin aspart  5 Units Subcutaneous TID WC   insulin glargine-yfgn  10 Units Subcutaneous BID   Isavuconazonium Sulfate  372 mg Oral Q8H   Followed by   Melene Muller ON 08/14/2022] Isavuconazonium Sulfate  372 mg Oral Daily   pantoprazole  40 mg Oral BID   Continuous Infusions:  meropenem (MERREM) IV Stopped (08/12/22 0539)   PRN Meds:.acetaminophen **OR** acetaminophen, albuterol, fentaNYL (SUBLIMAZE) injection, guaiFENesin, ondansetron **OR** ondansetron (ZOFRAN) IV, mouth rinse, oxyCODONE, traZODone No Known Allergies  OBJECTIVE: Vitals:   08/12/22 0302 08/12/22 0310 08/12/22 0500 08/12/22 0524  BP:  (!) 128/45  (!) 127/58  Pulse: 100 97  83  Resp: (!) 25 (!) 26  20  Temp:  98.8 F (37.1 C)    TempSrc:  Oral    SpO2: 96% 95%  99%  Weight:   54.3 kg   Height:       Body mass index is 22.62 kg/m.  Physical Exam Constitutional:      Appearance: Normal appearance.  HENT:     Head: Normocephalic and atraumatic.     Right Ear: Tympanic membrane normal.     Left Ear: Tympanic membrane normal.     Nose: Nose normal.     Mouth/Throat:     Mouth: Mucous membranes are moist.  Eyes:     Extraocular Movements: Extraocular movements intact.     Conjunctiva/sclera: Conjunctivae normal.     Pupils: Pupils are equal, round, and reactive to light.  Cardiovascular:     Rate and Rhythm: Normal rate and regular rhythm.     Heart sounds: No murmur heard.    No friction rub. No gallop.  Pulmonary:     Effort: Pulmonary effort is normal.     Breath sounds: Normal breath sounds.  Abdominal:     General: Abdomen is flat.     Palpations: Abdomen is soft.  Musculoskeletal:        General: Normal range of motion.  Skin:    General: Skin is warm and dry.  Neurological:     General: No focal deficit present.     Mental Status: She is alert and oriented to person, place, and time.  Psychiatric:        Mood and  Affect: Mood normal.       Lab Results Lab Results  Component Value Date   WBC 1.1 (LL) 08/12/2022   HGB 7.8 (L) 08/12/2022   HCT 22.6 (L) 08/12/2022   MCV 92.2 08/12/2022   PLT 19 (LL) 08/12/2022    Lab Results  Component Value Date   CREATININE 0.39 (L) 08/12/2022   BUN 11 08/12/2022   NA 130 (L) 08/12/2022   K 3.8 08/12/2022   CL 96 (L) 08/12/2022   CO2 28 08/12/2022    Lab Results  Component Value Date   ALT 14 08/10/2022   AST 24 08/10/2022   ALKPHOS 78 08/10/2022   BILITOT 0.9 08/10/2022        Danelle Earthly, MD [facility] 08/12/2022, 9:06 AM   I have personally spent 54 minutes involved in face-to-face and non-face-to-face activities for this patient on the day of the visit. Professional time spent includes the following activities: Preparing to see the patient (review of tests), Obtaining and/or reviewing separately  obtained history (admission/discharge record), Performing a medically appropriate examination and/or evaluation , Ordering medications/tests/procedures, referring and communicating with other health care professionals, Documenting clinical information in the EMR, Independently interpreting results (not separately reported), Communicating results to the patient/family/caregiver, Counseling and educating the patient/family/caregiver and Care coordination (not separately reported).

## 2022-08-13 ENCOUNTER — Inpatient Hospital Stay (HOSPITAL_COMMUNITY): Payer: Medicare Other

## 2022-08-13 ENCOUNTER — Other Ambulatory Visit: Payer: Self-pay

## 2022-08-13 DIAGNOSIS — D709 Neutropenia, unspecified: Secondary | ICD-10-CM | POA: Diagnosis not present

## 2022-08-13 DIAGNOSIS — J9601 Acute respiratory failure with hypoxia: Secondary | ICD-10-CM

## 2022-08-13 DIAGNOSIS — R5081 Fever presenting with conditions classified elsewhere: Secondary | ICD-10-CM | POA: Diagnosis not present

## 2022-08-13 DIAGNOSIS — R9389 Abnormal findings on diagnostic imaging of other specified body structures: Secondary | ICD-10-CM

## 2022-08-13 DIAGNOSIS — Z7189 Other specified counseling: Secondary | ICD-10-CM

## 2022-08-13 LAB — COMPREHENSIVE METABOLIC PANEL
ALT: 13 U/L (ref 0–44)
AST: 20 U/L (ref 15–41)
Albumin: 1.6 g/dL — ABNORMAL LOW (ref 3.5–5.0)
Alkaline Phosphatase: 97 U/L (ref 38–126)
Anion gap: 9 (ref 5–15)
BUN: 12 mg/dL (ref 8–23)
CO2: 27 mmol/L (ref 22–32)
Calcium: 7.5 mg/dL — ABNORMAL LOW (ref 8.9–10.3)
Chloride: 95 mmol/L — ABNORMAL LOW (ref 98–111)
Creatinine, Ser: 0.42 mg/dL — ABNORMAL LOW (ref 0.44–1.00)
GFR, Estimated: >60 mL/min (ref >60)
Glucose, Bld: 254 mg/dL — ABNORMAL HIGH (ref 70–99)
Potassium: 4.1 mmol/L (ref 3.5–5.1)
Sodium: 131 mmol/L — ABNORMAL LOW (ref 135–145)
Total Bilirubin: 1.1 mg/dL (ref 0.3–1.2)
Total Protein: 7 g/dL (ref 6.5–8.1)

## 2022-08-13 LAB — CBC WITH DIFFERENTIAL/PLATELET
Abs Immature Granulocytes: 0.08 10*3/uL — ABNORMAL HIGH (ref 0.00–0.07)
Basophils Absolute: 0 10*3/uL (ref 0.0–0.1)
Basophils Absolute: 0 10*3/uL (ref 0.0–0.1)
Basophils Relative: 1 %
Eosinophils Absolute: 0 10*3/uL (ref 0.0–0.5)
Eosinophils Absolute: 0 10*3/uL (ref 0.0–0.5)
Eosinophils Relative: 0 %
Eosinophils Relative: 0 %
HCT: 24.6 % — ABNORMAL LOW (ref 36.0–46.0)
Hemoglobin: 7.8 g/dL — ABNORMAL LOW (ref 12.0–15.0)
Hemoglobin: 8.1 g/dL — ABNORMAL LOW (ref 12.0–15.0)
Immature Granulocytes: 8 %
Lymphocytes Relative: 50 %
Lymphocytes Relative: 32 %
Lymphs Abs: 0.6 10*3/uL — ABNORMAL LOW (ref 0.7–4.0)
Lymphs Abs: 0.3 10*3/uL — ABNORMAL LOW (ref 0.7–4.0)
MCH: 31.8 pg (ref 26.0–34.0)
MCH: 29.6 pg (ref 26.0–34.0)
MCHC: 34.5 g/dL (ref 30.0–36.0)
MCHC: 32.9 g/dL (ref 30.0–36.0)
MCV: 92.2 fL (ref 80.0–100.0)
MCV: 89.8 fL (ref 80.0–100.0)
Monocytes Absolute: 0.4 10*3/uL (ref 0.1–1.0)
Monocytes Absolute: 0.5 10*3/uL (ref 0.1–1.0)
Monocytes Relative: 35 %
Monocytes Relative: 44 %
Neutro Abs: 0.2 10*3/uL — CL (ref 1.7–7.7)
Neutrophils Relative %: 15 %
Platelets: 19 10*3/uL — CL (ref 150–400)
Platelets: 11 10*3/uL — CL (ref 150–400)
RBC: 2.74 MIL/uL — ABNORMAL LOW (ref 3.87–5.11)
RDW: 14.7 % (ref 11.5–15.5)
WBC: 1.1 10*3/uL — CL (ref 4.0–10.5)
WBC: 1 10*3/uL — CL (ref 4.0–10.5)
nRBC: 8 /100 WBC — ABNORMAL HIGH
nRBC: 8.8 % — ABNORMAL HIGH (ref 0.0–0.2)

## 2022-08-13 LAB — GLUCOSE, CAPILLARY
Glucose-Capillary: 238 mg/dL — ABNORMAL HIGH (ref 70–99)
Glucose-Capillary: 129 mg/dL — ABNORMAL HIGH (ref 70–99)
Glucose-Capillary: 224 mg/dL — ABNORMAL HIGH (ref 70–99)
Glucose-Capillary: 338 mg/dL — ABNORMAL HIGH (ref 70–99)

## 2022-08-13 LAB — BPAM PLATELET PHERESIS: Blood Product Expiration Date: 202407212359

## 2022-08-13 LAB — BLOOD GAS, VENOUS
Acid-Base Excess: 3.2 mmol/L — ABNORMAL HIGH (ref 0.0–2.0)
Bicarbonate: 27.9 mmol/L (ref 20.0–28.0)
O2 Saturation: 71.8 %
Patient temperature: 37
pCO2, Ven: 42 mmHg — ABNORMAL LOW (ref 44–60)
pH, Ven: 7.43 (ref 7.25–7.43)
pO2, Ven: 40 mmHg (ref 32–45)

## 2022-08-13 LAB — PREPARE PLATELET PHERESIS

## 2022-08-13 LAB — MAGNESIUM: Magnesium: 1.7 mg/dL (ref 1.7–2.4)

## 2022-08-13 LAB — FUNGUS CULTURE, BLOOD

## 2022-08-13 MED ORDER — DEXTROMETHORPHAN POLISTIREX ER 30 MG/5ML PO SUER
30.0000 mg | Freq: Once | ORAL | Status: AC
  Administered 2022-08-13: 30 mg via ORAL
  Filled 2022-08-13: qty 5

## 2022-08-13 MED ORDER — METOPROLOL TARTRATE 5 MG/5ML IV SOLN
5.0000 mg | Freq: Once | INTRAVENOUS | Status: AC
  Administered 2022-08-13: 5 mg via INTRAVENOUS
  Filled 2022-08-13: qty 5

## 2022-08-13 MED ORDER — ORAL CARE MOUTH RINSE: 15.0000 mL | OROMUCOSAL | Status: DC | PRN

## 2022-08-13 MED ORDER — METHYLPREDNISOLONE SODIUM SUCC 125 MG IJ SOLR
125.0000 mg | Freq: Four times a day (QID) | INTRAMUSCULAR | Status: DC
  Administered 2022-08-13 – 2022-08-15 (×9): 125 mg via INTRAVENOUS
  Filled 2022-08-13 (×9): qty 2

## 2022-08-13 MED ORDER — SODIUM CHLORIDE 0.9% IV SOLUTION: Freq: Once | INTRAVENOUS | Status: AC

## 2022-08-13 MED ORDER — ORAL CARE MOUTH RINSE: 15.0000 mL | OROMUCOSAL | Status: DC

## 2022-08-13 MED ORDER — FUROSEMIDE 10 MG/ML IJ SOLN
40.0000 mg | Freq: Once | INTRAMUSCULAR | Status: AC
  Administered 2022-08-13: 40 mg via INTRAVENOUS
  Filled 2022-08-13: qty 4

## 2022-08-13 MED ORDER — ORAL CARE MOUTH RINSE
15.0000 mL | OROMUCOSAL | Status: DC
  Administered 2022-08-13 – 2022-08-14 (×3): 15 mL via OROMUCOSAL

## 2022-08-13 MED ORDER — INSULIN GLARGINE-YFGN 100 UNIT/ML ~~LOC~~ SOLN
15.0000 [IU] | Freq: Two times a day (BID) | SUBCUTANEOUS | Status: DC
  Administered 2022-08-13 – 2022-08-14 (×3): 15 [IU] via SUBCUTANEOUS
  Filled 2022-08-13 (×4): qty 0.15

## 2022-08-13 MED ORDER — MORPHINE SULFATE (PF) 2 MG/ML IV SOLN
1.0000 mg | Freq: Once | INTRAVENOUS | Status: AC
  Administered 2022-08-13: 1 mg via INTRAVENOUS
  Filled 2022-08-13: qty 1

## 2022-08-13 NOTE — IPAL (Signed)
  Interdisciplinary Goals of Care Family Meeting   Date carried out:: 08/13/2022  Location of the meeting: Bedside  Member's involved: Physician, Bedside Registered Nurse, and Family Member or next of kin.  Spanish video interpreter Matthews # [phone] used for this encounter. Daughter is also bilingual Spanish & English.   Durable Power of Insurance risk surveyor: self, daughters  Discussion: We discussed goals of care for [employer] .  I discussed Ms. Leicht's  illness course and my concern for her progressive respiratory failure despite aggressive antimicrobial therapy. Bmbx is pending, but there is clinical concern for secondary leukemic conversion with previous chemotherapy, which carries a poor prognosis. Steroids are indicated for possible acute leukemic infiltrates, but if she fails to respond to steroids and antimicrobials, this is not going to be a reversible process. I am concerned that if her respiratory failure progresses and she goes on the vent, I think this will be terminal. We discussed that the vent would be uncomfortable and would not allow for her to talk to her family.  I do not think that mechanical ventilation would be indicated if she continues to progress despite maximal medical therapy.  She and her daughters understand the negative signs of mechanical ventilation and agree with a conservative approach with continued aggressive care and BiPAP, but if she is failing this will become more comfortable switching to focus more on comfort.  We discussed chest compressions as well and they understand that this would not benefit her.  Code status: Full DNR  Disposition: Continue current acute care; transition to comfort if failing   Time spent for the meeting: 25 min.   Steffanie Dunn 08/13/2022, 11:49 AM

## 2022-08-13 NOTE — Progress Notes (Signed)
FIO2 decreased to 65% from 80% .  RN had increased FIO2 to 80% after pt failed attempt off bipap.   Pts sats are 99% now on 65%FIO2, will continue to wean FIO2 down as tolerated.

## 2022-08-13 NOTE — Consult Note (Signed)
NAME:  Amanda Duarte, MRN:  [id number], DOB:  Nov 04, 1955, LOS: 15 ADMISSION DATE:  07/29/2022, CONSULTATION DATE:  7/20 REFERRING MD:  Risa Grill, CHIEF COMPLAINT:  respiratory failure   History of Present Illness:  Amanda Duarte is a 67 year old woman with a history of breast cancer and subsequent myelodysplastic syndrome who presented on 7/5 with fever and thrombocytopenia.  Since admission she has been on vancomycin, micafungin > Cresemba, cefepime > meropenem.  She has had progressive respiratory failure but no identification of organism despite bronchoscopy with cultures.  She has been pancytopenic throughout the admission.  Throughout her admissions she has had fevers nearly daily as high as 103, yesterday Tmax 101.7.  Despite aggressive antibiotics she has failed to improve from a pulmonary standpoint; she has worsened hypoxia and is now requiring BiPAP, only tolerating about 20 minutes off at a time.  Her FiO2 has been increasing. Today she is more sleepy than previous days.  She feels comfortable on BiPAP.   Pertinent  Medical History  Breast cancer s/o mastectomy, chemo, radiation Dysplastic syndrome Cerebellar stroke Hypertension Hyperlipidemia Diabetes Periorbital cellulitis Significant Hospital Events: Including procedures, antibiotic start and stop dates in addition to other pertinent events   7/5 admitted 7/12 bronch with bronchial wash 7/19 BM biopsy 7/20 required bipap overnight for escalating O2 requirements  Interim History / Subjective:    Objective   Blood pressure (!) 120/51, pulse (!) 114, temperature 100.2 F (37.9 C), resp. rate (!) 25, height 5\' 1"  (1.549 m), weight 52.1 kg, SpO2 93%.    FiO2 (%):  [50 %] 50 %   Intake/Output Summary (Last 24 hours) at 08/13/2022 1101 Last data filed at 08/13/2022 0508 Gross per 24 hour  Intake 500 ml  Output 200 ml  Net 300 ml   Filed Weights   08/11/22 0546 08/12/22 0500 08/13/22 0423  Weight: 55.7 kg 54.3 kg 52.1 kg     Examination: General: chronically ill appearing woman lying in bed in NAD on bipap HENT: [place]/AT, eyes anicteric, bipap mask Lungs: rhales bilaterally, tachypnea without accessory muscle use.  Cardiovascular: S1s2, RRR Abdomen: soft, NT Extremities: no peripheral edema, no cyanosis Neuro: sleepy but arouses, asks questions, moving extremities but globally weak Derm: healed scars on her chest, no diffuse rashes or significant petechiae  Sodium 131 BUN 12 Creatinine 0.42 White blood cell count 1.0 H/H 8.9/24.6 Platelets 11 CXR personally reviewed-sitting right upper lobe infiltrate, persistent bibasilar opacities CT chest personally reviewed-dense groundglass opacities throughout most lobes of the lungs.  Resolved Hospital Problem list     Assessment & Plan:   Acute respiratory failure with hypoxia- patchy bilateral groundglass opacities.  Frontal diagnosis includes infection, transfer is likely with neutropenia.  Cultures so far been positive.  Leukemic infiltration of the pulmonary parenchyma is possible, DAH seems less likely with relatively stable hemoglobin but remains possible as well. -con't broad antimicrobials -high dose steroids; solumedrol 125mg  q6h x 3 days -needs PPI with high dose steroids -con't BiPAP -lasix, but does not appear hypervolemic -recommend against intubation at this point if she progresses- see ipal note.   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; aplastic anemia is also possible -Oncology following -Has been on chemo, but per Onc is a poor candidate for intensive therapy at this point and has a poor prognosis for her disease  Uncontrolled hyperglycemia; will get worse with steroids -increase long-acting insulin -con't mealtime insulin & SSI QID -goal BG 140-180   Best Practice (right click and "Reselect all SmartList Selections" daily)  Diet/type: NPO DVT prophylaxis: SCD GI prophylaxis: PPI Lines: N/A Foley:  N/A Code Status:   DNR Last date of multidisciplinary goals of care discussion [7/20- see ipal note]  Labs   CBC: Recent Labs  Lab 08/09/22 0550 08/10/22 0307 08/11/22 0738 08/12/22 0343 08/13/22 0253  WBC 1.0* 0.9* 0.8* 1.1* 1.0*  NEUTROABS 0.2* 0.1* 0.1* 0.1* 0.2*  HGB 9.8* 8.2* 8.6* 7.8* 8.1*  HCT 27.6* 24.3* 25.9* 22.6* 24.6*  MCV 95.5 94.2 89.9 92.2 89.8  PLT 24* 6* 28* 19* 11*    Basic Metabolic Panel: Recent Labs  Lab 08/07/22 0257 08/08/22 0325 08/09/22 0550 08/10/22 0307 08/11/22 0319 08/12/22 0343 08/13/22 0253  NA 136 132* 130* 130* 132* 130* 131*  K 4.0 3.8 4.2 3.4* 3.4* 3.8 4.1  CL 104 98 95* 95* 94* 96* 95*  CO2 23 24 26 25 29 28 27   GLUCOSE 109* 194* 105* 95 83 88 254*  BUN 19 9 9 8 8 11 12   CREATININE 0.41* 0.36* 0.39* 0.35* 0.35* 0.39* 0.42*  CALCIUM 8.0* 7.4* 7.6* 7.3* 7.2* 7.6* 7.5*  MG 1.8 1.4* 1.8 1.6* 1.4*  --  1.7  PHOS 3.1 3.4  --   --  2.7  --   --    GFR: Estimated Creatinine Clearance: 51.5 mL/min (A) (by C-G formula based on SCr of 0.42 mg/dL (L)). Recent Labs  Lab 08/10/22 0307 08/11/22 0319 08/11/22 0738 08/12/22 0343 08/13/22 0253  PROCALCITON  --   --  3.78  --   --   WBC 0.9*  --  0.8* 1.1* 1.0*  LATICACIDVEN  --  1.4  --   --   --     Liver Function Tests: Recent Labs  Lab 08/07/22 0257 08/08/22 0325 08/10/22 0307 08/13/22 0253  AST 14* 31 24 20   ALT 11 18 14 13   ALKPHOS 61 87 78 97  BILITOT 0.7 0.9 0.9 1.1  PROT 6.9 6.8 6.4* 7.0  ALBUMIN 2.1* 2.0* 1.8* 1.6*   No results for input(s): "LIPASE", "AMYLASE" in the last 168 hours. Recent Labs  Lab 08/07/22 0257 08/11/22 0738  AMMONIA 44* 61*    ABG    Component Value Date/Time   PHART 7.39 12/07/2021 1130   PCO2ART 36 12/07/2021 1130   PO2ART 101 12/07/2021 1130   HCO3 27.9 08/13/2022 0610   TCO2 30 05/31/2017 1113   ACIDBASEDEF 0.4 08/04/2022 1309   O2SAT 71.8 08/13/2022 0610     Coagulation Profile: No results for input(s): "INR", "PROTIME" in the last 168  hours.  Cardiac Enzymes: No results for input(s): "CKTOTAL", "CKMB", "CKMBINDEX", "TROPONINI" in the last 168 hours.  HbA1C: Hemoglobin A1C  Date/Time Value Ref Range Status  05/16/2022 09:42 AM 10.0 (A) 4.0 - 5.6 % Final   HbA1c, POC (controlled diabetic range)  Date/Time Value Ref Range Status  10/28/2021 12:07 PM 11.4 (A) 0.0 - 7.0 % Final  06/30/2020 04:02 PM 8.8 (A) 0.0 - 7.0 % Final   Hgb A1c MFr Bld  Date/Time Value Ref Range Status  07/10/2022 09:48 AM 11.9 (H) 4.8 - 5.6 % Final    Comment:    (NOTE) Pre diabetes:          5.7%-6.4%  Diabetes:              >6.4%  Glycemic control for   <7.0% adults with diabetes   01/31/2022 10:15 AM 10.7 (H) 4.8 - 5.6 % Final    Comment:  Prediabetes: 5.7 - 6.4          Diabetes: >6.4          Glycemic control for adults with diabetes: <7.0     CBG: Recent Labs  Lab 08/12/22 1202 08/12/22 1229 08/12/22 1620 08/12/22 2159 08/13/22 0827  GLUCAP 49* 160* 230* 298* 238*    Review of Systems:   Ros limited due to respiratory failure, fatigue  Past Medical History:  She,  has a past medical history of Cancer (HCC) (03/2017), Diabetes mellitus without complication (HCC), Hyperlipidemia, Hypertension, Malignant neoplasm of left breast (HCC), Stroke (cerebrum) (HCC), and Tibial plateau fracture, left.   Surgical History:   Past Surgical History:  Procedure Laterality Date   BRONCHIAL WASHINGS  08/05/2022   Procedure: BRONCHIAL WASHINGS;  Surgeon: Oretha Milch, MD;  Location: [facility];  Service: Cardiopulmonary;;   MASTECTOMY Left 2019   MASTECTOMY MODIFIED RADICAL Left 11/20/2017   Procedure: LEFT MODIFIED RADICAL MASTECTOMY;  Surgeon: Claud Kelp, MD;  Location: [facility] OR;  Service: General;  Laterality: Left;   ORIF TIBIA PLATEAU Left 04/13/2017   Procedure: OPEN REDUCTION INTERNAL FIXATION (ORIF) TIBIAL PLATEAU;  Surgeon: Roby Lofts, MD;  Location: [facility] OR;  Service: Orthopedics;  Laterality: Left;    PORT-A-CATH REMOVAL Right 11/20/2017   Procedure: REMOVAL PORT-A-CATH;  Surgeon: Claud Kelp, MD;  Location: [facility] OR;  Service: General;  Laterality: Right;   PORTACATH PLACEMENT Right 05/31/2017   Procedure: INSERTION PORT-A-CATH;  Surgeon: Claud Kelp, MD;  Location: [facility];  Service: General;  Laterality: Right;   VIDEO BRONCHOSCOPY N/A 08/05/2022   Procedure: VIDEO BRONCHOSCOPY WITHOUT FLUORO;  Surgeon: Oretha Milch, MD;  Location: [facility];  Service: Cardiopulmonary;  Laterality: N/A;     Social History:   reports that she has never smoked. She has never used smokeless tobacco. She reports that she does not drink alcohol and does not use drugs.   Family History:  Her family history includes Diabetes in her son; Heart Problems in her mother. There is no history of Breast cancer, Colon cancer, Rectal cancer, Stomach cancer, or Esophageal cancer.   Allergies No Known Allergies   Home Medications  Prior to Admission medications   Medication Sig Start Date End Date Taking? Authorizing Provider  acetaminophen (TYLENOL) 325 MG tablet Take 650 mg by mouth every 4 (four) hours as needed for moderate pain.   Yes [provider]  amLODipine (NORVASC) 5 MG tablet Take 1 tablet (5 mg total) by mouth daily. For blood pressure 05/16/22  Yes Claiborne Rigg, NP  atorvastatin (LIPITOR) 40 MG tablet Take 1 tablet (40 mg total) by mouth daily. For cholesterol 01/04/22  Yes Claiborne Rigg, NP  Continuous Glucose Receiver (FREESTYLE LIBRE READER) DEVI Monitor blood glucose levels 5-6 times per day E11.65 05/16/22  Yes Claiborne Rigg, NP  Continuous Glucose Sensor (FREESTYLE LIBRE 2 SENSOR) MISC Monitor blood glucose levels 5-6 times per day E11.65 05/16/22  Yes Claiborne Rigg, NP  erythromycin ophthalmic ointment Place 1 Application into the right eye 2 (two) times daily.   Yes [provider]  fenofibrate (TRICOR) 145 MG tablet Take 1 tablet (145 mg total)  by mouth daily. For cholesterol 01/04/22  Yes Claiborne Rigg, NP  fluticasone (FLONASE) 50 MCG/ACT nasal spray Place 2 sprays into both nostrils daily. 01/04/22  Yes Claiborne Rigg, NP  levofloxacin (LEVAQUIN) 500 MG tablet Take 1 tablet (500 mg total) by mouth daily. 05/26/22  Yes Mosetta Putt,  Terrace Arabia, MD  oxyCODONE (OXY IR/ROXICODONE) 5 MG immediate release tablet Take 5 mg by mouth every 4 (four) hours as needed for severe pain. Take 0.5 to 1 tablet (2.5-5 mg total) by mouth every 4 (four) hours as needed for up to 5 days   Yes [provider]  potassium chloride SA (KLOR-CON M) 20 MEQ tablet Take 1 tablet (20 mEq total) by mouth daily. 01/04/22  Yes Claiborne Rigg, NP  Blood Glucose Monitoring Suppl (ONETOUCH VERIO) w/Device KIT Use to check blood sugar three times daily. E11.65 07/08/22   Hoy Register, MD  Blood Pressure Monitor DEVI Please provide patient with insurance approved blood pressure monitor 01/14/19   Claiborne Rigg, NP  carboxymethylcellulose (REFRESH PLUS) 0.5 % SOLN Place 1 drop into the right eye 4 (four) times daily.    [provider]  ceFEPIme 2 g in sodium chloride 0.9 % 100 mL Inject 2 g into the vein every 12 (twelve) hours. Patient not taking: Reported on 07/29/2022 07/13/22   Leatha Gilding, MD  citalopram (CELEXA) 20 MG tablet Take 1 tablet (20 mg total) by mouth daily. For depression 01/04/22   Claiborne Rigg, NP  glucose blood test strip Use to check blood sugar three times daily. E11.65 07/08/22   Hoy Register, MD  HUMALOG KWIKPEN 100 UNIT/ML KwikPen Inject 10 Units into the skin 3 (three) times daily. 05/19/22   Hoy Register, MD  Insulin Glargine (BASAGLAR KWIKPEN) 100 UNIT/ML Inject 20 Units into the skin 2 (two) times daily. 05/17/22   Hoy Register, MD  Insulin Pen Needle 31G X 5 MM MISC Inject 1 Device into the skin QID. For use with insulin pens 07/08/22   Hoy Register, MD  Insulin Syringe-Needle U-100 30G X 5/16" 1 ML MISC Use as directed  to inject into the skin 3 times daily. 01/04/22   Claiborne Rigg, NP  Isavuconazonium Sulfate (CRESEMBA) 186 MG CAPS Take 2 capsules (372 mg total) by mouth every 8 (eight) hours for 2 days, THEN 2 capsules (372 mg total) daily for 12 days. 08/11/22 08/25/22  Danelle Earthly, MD  losartan (COZAAR) 50 MG tablet Take 50 mg by mouth daily.    [provider]  metroNIDAZOLE (FLAGYL) 500 MG tablet Take 1 tablet (500 mg total) by mouth every 12 (twelve) hours. 07/13/22   Leatha Gilding, MD  ondansetron (ZOFRAN) 8 MG tablet Take 1 tablet (8 mg total) by mouth every 8 (eight) hours as needed for nausea or vomiting. Patient not taking: Reported on 07/09/2022 11/10/21   Malachy Mood, MD  OneTouch Delica Lancets 33G MISC Use to check blood sugar three times daily. E11.65 07/08/22   Hoy Register, MD  pantoprazole (PROTONIX) 40 MG tablet Take 1 tablet (40 mg total) by mouth daily at 6 (six) AM. For heartburn Patient taking differently: Take 40 mg by mouth daily before breakfast. 05/16/22   Claiborne Rigg, NP  prochlorperazine (COMPAZINE) 10 MG tablet Take 1 tablet (10 mg total) by mouth every 6 (six) hours as needed for nausea or vomiting. Patient not taking: Reported on 07/30/2022 11/10/21   Malachy Mood, MD  sitaGLIPtin-metformin (JANUMET) 50-1000 MG tablet Take 1 tablet by mouth 2 (two) times daily with a meal.    [provider]  vancomycin (VANCOREADY) 750 MG/150ML SOLN Inject 150 mLs (750 mg total) into the vein daily. Patient not taking: Reported on 07/29/2022 07/13/22   Leatha Gilding, MD     Critical care time: 53 min.  Steffanie Dunn, DO 08/13/22 3:26 PM [facility]  For contact information, see Amion. If no response to pager, please call PCCM consult pager. After hours, 7PM- 7AM, please call Elink.

## 2022-08-13 NOTE — Progress Notes (Signed)
   08/13/22 0430  BiPAP/CPAP/SIPAP  $ Non-Invasive Ventilator  Non-Invasive Vent Initial  $ Face Mask Medium Yes  BiPAP/CPAP/SIPAP Pt Type Adult  BiPAP/CPAP/SIPAP V60  Mask Type Full face mask  Mask Size Medium  Set Rate 8 breaths/min  Respiratory Rate 46 breaths/min  IPAP 14 cmH20  EPAP 8 cmH2O  FiO2 (%) 50 %  Flow Rate 0 lpm  Minute Ventilation 25.7  Leak 0  Peak Inspiratory Pressure (PIP) 17  Tidal Volume (Vt) 556  Patient Home Equipment No  Auto Titrate No  Press High Alarm 30 cmH2O  Press Low Alarm 5 cmH2O   PT placed on bipap per order, tolerating well at this time, but continues to be very tachypnic, RT will continue to monitor.

## 2022-08-13 NOTE — Progress Notes (Addendum)
    Patient Name: Amanda Duarte           DOB: 1955-11-06  MRN: [id number]      Admission Date: 07/29/2022  Attending Provider: Leatha Gilding, MD  Primary Diagnosis: Neutropenic fever [facility])   Level of care: Stepdown    CROSS COVER NOTE   Date of Service   08/13/2022   Amanda Duarte, 67 y.o. female, was admitted on 07/29/2022 for Neutropenic fever (HCC).    HPI/Events of Note   Acute respiratory distress  A/O x 4, currently on 12-15 L HFNC satting 92%, tachypneic with RR 40s, tachycardic HR 120-150s.   Patient endorses SOB, fatigue, and cough with sputum production.  Denies CP, palpitations, abdominal discomfort, nausea, vomiting.   During bedside evaluation, patient is awake and denying discomfort. However, increase WOB and accessory muscle use noted.  Bilateral rhonchi lung sounds. RN thinks it sounds worse than earlier tonight. Some difficulty communicating given respiratory distress.  Chest x-ray --> severe bilateral airspace disease similar to prior chest CT. Possible pleural fluid at the left base. IV Lasix  Patient seems to be tolerating BiPAP well.  Does not appear to be anxious or in pain at this time, yet remaining tachycardic.  Order placed for IV Lopressor.   Interventions/ Plan   EKG --> Sinus tachycardia (HR 140-150s) without ST segment changes Chest xray  IV morphine, 1 mg-dyspnea BiPAP VBG IV Lasix, 40 mg IV Lopressor, 5 mg        Anthoney Harada, DNP, [facility] [employer]

## 2022-08-13 NOTE — Plan of Care (Signed)
At 2:30, patient was complaining of 5/10 pain when coughing. Cough and pain medications were given. She continued to be tachycardic and tachypnic. NP evaluated her at bedside and ordered medications, labs, xray. Patient was also placed in BIPAP. Patient complained she's having a hard time breathing and felt tired.    Problem: Clinical Measurements: Goal: Ability to maintain clinical measurements within normal limits will improve Outcome: Not Progressing Goal: Respiratory complications will improve Outcome: Not Progressing   Problem: Activity: Goal: Risk for activity intolerance will decrease Outcome: Not Progressing

## 2022-08-13 NOTE — Progress Notes (Signed)
PROGRESS NOTE  Amanda Duarte ZOX:[id number] DOB: Apr 18, 1955 DOA: 07/29/2022 PCP: Claiborne Rigg, NP   LOS: 15 days   Brief Narrative / Interim history: Amanda Duarte is a 67 y.o. 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 She is also febrile and being treated for multifocal pneumonia.   Significant events: October 2023- she had azacitidine injection.  She was admitted to hospital after chemo, for severe profound pancytopenia, aspiration pneumonia, required intubation.   January 2024-started on 2 cycles of reduced azacitidine   She was also evaluted at [facility] for possible bone marrow transplant. There was concern for disease patient given worsening cytopenias. 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 June, 2024 - hospitalized, thought to be a periorbital colitis and potential ostiomeatal sinusitis based on imaging. She was given broad-spectrum bacterial therapy form of vancomycin cefepime and metronidazole.  She was seen by ophthalmology and per recommendation, patient was transferred to [facility].  Over there, she was seen by ophthalmology and ENT.  Per family's account, at [facility], she had an IND of occluded tear duct, no growth in the culture.  She was discharged from the hospital in about 3 days.  Outpatient follow-up showed patent tear ducts. Back to her chemotherapy twice a week was recently held due to worsening cytopenia.  She was referred to leukemia specialist Dr. Sharyne Richters. In the interim she developed fevers though without other focal symptoms.  7/5, seen by oncology and found to be febrile to 100.4.  Her platelets dropped to 14,000.  She was given a platelet transfusion.  Chest x-ray showed some peribronchial thickening. Sent to ED. Labs showed severe leukopenia with a white count of 1.3 and platelet 22,000 Admitted to [facility] for neutropenic fever Patient  was started on empiric antibiotics.  Despite antibiotic coverage for 72 hours, fever did not improve. 7/9, ID was consulted 7/9, CT of the orbits showed resolved right orbital cellulitis and unresolved paranasal sinus inflammation now more bilateral and symmetric 7/9, CT chest showed multifocal airspace disease with tree-in-bud and nodular opacities throughout all lobes of both lungs. 7/11, with worsening fever, PCCM was consulted to assist bronchoscopy. 7/12, underwent bronchoscopy.  Per report, copious amount of light green secretions were noted in the left lower lobe bronchus,.  Suctioned out.  No endobronchial lesions seen.  BAL subsequently resulted negative for malignant cells, negative culture, negative for pneumocystis, Aspergillus, AFB smear Subsequent chest x-rays on 7/14 and 7/16 showed increasing multifocal pneumonia and layering effusions bilaterally. PCCM considered thoracentesis but on ultrasound, effusion was noted to be too small to tap. 7/17, CT chest showed extensive infiltrates in both lungs right more than left, no significant pleural effusion.  Hilar and mediastinal lymphadenopathy suggestive of reactive hyperplasia  Subjective / 24h Interval events: Got worse last night.  Breathing with more difficulties and had to be placed on BiPAP  Assesement and Plan: Principal Problem:   Neutropenic fever (HCC) Active Problems:   Multifocal pneumonia   Pleural effusion on left   Pleural effusion  Principal problem Neutropenic fever, sepsis Due to Multifocal Pneumonia, E. Coli UTI - ID following, currently on meropenem. Most recent CT scan of the chest on 7/17 with extensive bilateral infiltrates, worsened since prior imaging.  -continue antibiotics -ID and oncology following.  -Due to worsening respiratory status pulmonary consulted again.  Try steroids  Active problems Pancytopenia, underlying MDS - requiring multiple transfusions, total of 8  units of platelets and 2 of  pRBC -Transfusion additional unit of platelets today for platelets of 11, also had some hemoptysis overnight  Acute respiratory failure with hypoxia - Due to multifocal pneumonia.  Echo 7/13 showed EF 50 to 55%, no WMA -Respiratory status worse overnight.  Chest x-ray overnight shows extensive airspace disease - pulm to see, there is concern about this eventually progressing to require intubation  Hypertension - Blood pressure fluctuating. Previously on amlodipine and losartan.  Currently on hold.   Acute Metabolic Encephalopathy, Elevated ammonia level - Mental status altered probably because of high fever and infection.  Today she is very sleepy and lethargic  UTI - Urine culture grew E. coli, pansensitive   Hyponatremia - Sodium running low.  Trend.  Poor oral intake  Type 2 diabetes mellitus uncontrolled with hyperglycemia - A1c 11.9 in June 2024. Currently on Semglee 10 units twice daily, insulin aspart 5 units 3 times daily as well as Premeal SSI/Accu-Cheks  CBG (last 3)  Recent Labs    08/12/22 1620 08/12/22 2159 08/13/22 0827  GLUCAP 230* 298* 238*    Scheduled Meds:  sodium chloride   Intravenous Once   Chlorhexidine Gluconate Cloth  6 each Topical Daily   insulin aspart  0-20 Units Subcutaneous TID WC   insulin aspart  0-5 Units Subcutaneous QHS   insulin aspart  5 Units Subcutaneous TID WC   insulin glargine-yfgn  10 Units Subcutaneous BID   Isavuconazonium Sulfate  372 mg Oral Q8H   Followed by   Melene Muller ON 08/14/2022] Isavuconazonium Sulfate  372 mg Oral Daily   pantoprazole  40 mg Oral BID   Continuous Infusions:  meropenem (MERREM) IV Stopped (08/13/22 0538)   PRN Meds:.acetaminophen **OR** acetaminophen, albuterol, fentaNYL (SUBLIMAZE) injection, guaiFENesin, ondansetron **OR** ondansetron (ZOFRAN) IV, mouth rinse, oxyCODONE, traZODone  Current Outpatient Medications  Medication Instructions   acetaminophen (TYLENOL) 650 mg, Oral, Every 4 hours PRN    amLODipine (NORVASC) 5 mg, Oral, Daily, For blood pressure   atorvastatin (LIPITOR) 40 mg, Oral, Daily, For cholesterol   Basaglar KwikPen 20 Units, Subcutaneous, 2 times daily   Blood Glucose Monitoring Suppl (ONETOUCH VERIO) w/Device KIT Use to check blood sugar three times daily. E11.65   Blood Pressure Monitor DEVI Please provide patient with insurance approved blood pressure monitor   carboxymethylcellulose (REFRESH PLUS) 0.5 % SOLN 1 drop, Right Eye, 4 times daily   ceFEPIme 2 g in sodium chloride 0.9 % 100 mL 2 g, Intravenous, Every 12 hours   citalopram (CELEXA) 20 mg, Oral, Daily, For depression   Continuous Glucose Receiver (FREESTYLE LIBRE READER) DEVI Monitor blood glucose levels 5-6 times per day E11.65   Continuous Glucose Sensor (FREESTYLE LIBRE 2 SENSOR) MISC Monitor blood glucose levels 5-6 times per day E11.65   erythromycin ophthalmic ointment 1 Application, Right Eye, 2 times daily   fenofibrate (TRICOR) 145 mg, Oral, Daily, For cholesterol   fluticasone (FLONASE) 50 MCG/ACT nasal spray 2 sprays, Each Nare, Daily   glucose blood test strip Use to check blood sugar three times daily. E11.65   HumaLOG KwikPen 10 Units, Subcutaneous, 3 times daily   Insulin Pen Needle 31G X 5 MM MISC 1 Device, Subcutaneous, 4 times daily, For use with insulin pens   Insulin Syringe-Needle U-100 30G X 5/16" 1 ML MISC Use as directed to inject into the skin 3 times daily.   Isavuconazonium Sulfate (CRESEMBA) 186 MG CAPS Take 2 capsules (372 mg total) by mouth every 8 (eight) hours for  2 days, THEN 2 capsules (372 mg total) daily for 12 days.   levofloxacin (LEVAQUIN) 500 mg, Oral, Daily   losartan (COZAAR) 50 mg, Oral, Daily   metroNIDAZOLE (FLAGYL) 500 mg, Oral, Every 12 hours   ondansetron (ZOFRAN) 8 mg, Oral, Every 8 hours PRN   OneTouch Delica Lancets 33G MISC Use to check blood sugar three times daily. E11.65   oxyCODONE (OXY IR/ROXICODONE) 5 mg, Oral, Every 4 hours PRN, Take 0.5 to 1  tablet (2.5-5 mg total) by mouth every 4 (four) hours as needed for up to 5 days   pantoprazole (PROTONIX) 40 mg, Oral, Daily, For heartburn   potassium chloride SA (KLOR-CON M) 20 MEQ tablet 20 mEq, Oral, Daily   prochlorperazine (COMPAZINE) 10 mg, Oral, Every 6 hours PRN   sitaGLIPtin-metformin (JANUMET) 50-1000 MG tablet 1 tablet, Oral, 2 times daily with meals   vancomycin (VANCOREADY) 750 mg, Intravenous, Every 24 hours    Diet Orders (From admission, onward)     Start     Ordered   08/12/22 2117  Diet Carb Modified Fluid consistency: Thin; Room service appropriate? Yes  Diet effective now       Question Answer Comment  Diet-HS Snack? Nothing   Calorie Level Medium 1600-2000   Fluid consistency: Thin   Room service appropriate? Yes      08/12/22 2116            DVT prophylaxis: SCDs Start: 07/29/22 1726   Lab Results  Component Value Date   PLT 11 (LL) 08/13/2022      Code Status: Full Code  Family Communication: no family at bedside   Status is: Inpatient Remains inpatient appropriate because: severity of illness  Level of care: Stepdown  Consultants:  Oncology ID PCCM  Objective: Vitals:   08/13/22 0600 08/13/22 0756 08/13/22 0951 08/13/22 1019  BP: (!) 113/49  (!) 104/47 (!) 120/51  Pulse: (!) 114   (!) 114  Resp: (!) 30   (!) 25  Temp:  100.2 F (37.9 C) (!) 101.7 F (38.7 C) 100.2 F (37.9 C)  TempSrc:  Axillary Axillary   SpO2: 93%     Weight:      Height:        Intake/Output Summary (Last 24 hours) at 08/13/2022 1043 Last data filed at 08/13/2022 2725 Gross per 24 hour  Intake 500 ml  Output 200 ml  Net 300 ml   Wt Readings from Last 3 Encounters:  08/13/22 52.1 kg  07/09/22 55 kg  07/01/22 53.5 kg    Examination:  Constitutional: Sleepy, on BiPAP Eyes: lids and conjunctivae normal, no scleral icterus ENMT: mmm Neck: normal, supple Respiratory: clear to auscultation bilaterally, no wheezing, no crackles.  Cardiovascular:  Regular rate and rhythm, no murmurs / rubs / gallops. No LE edema. Abdomen: soft, no distention, no tenderness. Bowel sounds positive.   Data Reviewed: I have independently reviewed following labs and imaging studies   CBC Recent Labs  Lab 08/09/22 0550 08/10/22 0307 08/11/22 0738 08/12/22 0343 08/13/22 0253  WBC 1.0* 0.9* 0.8* 1.1* 1.0*  HGB 9.8* 8.2* 8.6* 7.8* 8.1*  HCT 27.6* 24.3* 25.9* 22.6* 24.6*  PLT 24* 6* 28* 19* 11*  MCV 95.5 94.2 89.9 92.2 89.8  MCH 33.9 31.8 29.9 31.8 29.6  MCHC 35.5 33.7 33.2 34.5 32.9  RDW 17.2* 15.3 14.5 15.0 14.7  LYMPHSABS 0.8 0.5* 0.5* 0.6* 0.3*  MONOABS 0.1 0.2 0.1 0.4 0.5  EOSABS 0.0 0.0 0.0 0.0 0.0  BASOSABS 0.0  0.0 0.0 0.0 0.0    Recent Labs  Lab 08/07/22 0257 08/08/22 0325 08/09/22 0550 08/10/22 0307 08/11/22 0319 08/11/22 0738 08/12/22 0343 08/13/22 0253  NA 136 132* 130* 130* 132*  --  130* 131*  K 4.0 3.8 4.2 3.4* 3.4*  --  3.8 4.1  CL 104 98 95* 95* 94*  --  96* 95*  CO2 23 24 26 25 29   --  28 27  GLUCOSE 109* 194* 105* 95 83  --  88 254*  BUN 19 9 9 8 8   --  11 12  CREATININE 0.41* 0.36* 0.39* 0.35* 0.35*  --  0.39* 0.42*  CALCIUM 8.0* 7.4* 7.6* 7.3* 7.2*  --  7.6* 7.5*  AST 14* 31  --  24  --   --   --  20  ALT 11 18  --  14  --   --   --  13  ALKPHOS 61 87  --  78  --   --   --  97  BILITOT 0.7 0.9  --  0.9  --   --   --  1.1  ALBUMIN 2.1* 2.0*  --  1.8*  --   --   --  1.6*  MG 1.8 1.4* 1.8 1.6* 1.4*  --   --  1.7  PROCALCITON  --   --   --   --   --  3.78  --   --   LATICACIDVEN  --   --   --   --  1.4  --   --   --   AMMONIA 44*  --   --   --   --  61*  --   --     ------------------------------------------------------------------------------------------------------------------ No results for input(s): "CHOL", "HDL", "LDLCALC", "TRIG", "CHOLHDL", "LDLDIRECT" in the last 72 hours.  Lab Results  Component Value Date   HGBA1C 11.9 (H) 07/10/2022    ------------------------------------------------------------------------------------------------------------------ No results for input(s): "TSH", "T4TOTAL", "T3FREE", "THYROIDAB" in the last 72 hours.  Invalid input(s): "FREET3"  Cardiac Enzymes No results for input(s): "CKMB", "TROPONINI", "MYOGLOBIN" in the last 168 hours.  Invalid input(s): "CK" ------------------------------------------------------------------------------------------------------------------    Component Value Date/Time   BNP 274.2 (H) 08/05/2022 1659    CBG: Recent Labs  Lab 08/12/22 1202 08/12/22 1229 08/12/22 1620 08/12/22 2159 08/13/22 0827  GLUCAP 49* 160* 230* 298* 238*    Recent Results (from the past 240 hour(s))  Culture, blood (Routine X 2) w Reflex to ID Panel     Status: None   Collection Time: 08/04/22  5:34 AM   Specimen: BLOOD  Result Value Ref Range Status   Specimen Description   Final    BLOOD BLOOD RIGHT HAND Performed at [facility], [address], [place], [place] [address]    Special Requests   Final    BOTTLES DRAWN AEROBIC ONLY Blood Culture adequate volume Performed at [facility], [address], [place], [place] [address]    Culture   Final    NO GROWTH 5 DAYS Performed at [facility], [address] [place], [place] [address]    Report Status 08/09/2022 FINAL  Final  Culture, blood (Routine X 2) w Reflex to ID Panel     Status: None   Collection Time: 08/04/22  5:34 AM   Specimen: BLOOD  Result Value Ref Range Status   Specimen Description   Final    BLOOD RIGHT WRIST Performed at [facility], [address], [place], [place] [address]    Special Requests   Final    BOTTLES DRAWN AEROBIC ONLY Blood Culture adequate volume Performed at [facility], [address], [place], [place] [address]    Culture   Final    NO GROWTH 5 DAYS Performed at [facility], [address] [place], [place] [address]    Report Status 08/09/2022 FINAL  Final  Expectorated Sputum Assessment w Gram Stain, Rflx to Resp Cult     Status: None   Collection Time: 08/04/22  7:44 PM   Specimen: Expectorated Sputum  Result Value Ref Range Status   Specimen Description EXPECTORATED SPUTUM  Final   Special Requests NONE  Final   Sputum evaluation   Final    Sputum specimen not acceptable for testing.  Please recollect.   NOTIFIED JEREMY RN Performed at [facility], [address], [place], [place] [address]    Report Status 08/04/2022 FINAL  Final  Fungus Culture With Stain     Status: None (Preliminary result)   Collection Time: 08/05/22 10:23 AM   Specimen: PATH Cytology Bronchial lavage; Body Fluid  Result Value Ref Range Status   Fungus Stain Final report  Final    Comment: (NOTE) Performed At: [facility] [address] [place], [place] [id number] Jolene Schimke MD PP:[id number]    Fungus (Mycology) Culture PENDING  Incomplete   Fungal Source BRONCHIAL ALVEOLAR LAVAGE  Final    Comment: L Performed at [facility], [address], [place], [place] [address]   Acid Fast Smear (AFB)     Status: None   Collection Time: 08/05/22 10:23 AM   Specimen: PATH Cytology Bronchial lavage; Body Fluid  Result Value Ref Range Status   AFB Specimen Processing Concentration  Final   Acid Fast Smear Negative  Final    Comment: (NOTE) Performed At: [facility] [address] [place], [place] [id number] Jolene Schimke MD TD:[id number]    Source (AFB) BRONCHIAL ALVEOLAR LAVAGE  Final    Comment: L Performed at [facility], [address], [place], [place] [address]   Body fluid culture w Gram Stain     Status: None   Collection Time: 08/05/22 10:23 AM   Specimen: PATH Cytology Bronchial lavage; Body Fluid  Result Value Ref Range Status   Specimen Description   Final    BRONCHIAL ALVEOLAR LAVAGE  L Performed at [facility], [address] [place], [place] [address]    Special Requests   Final    NONE Performed at [facility], [address] [place], [place] [address]    Gram Stain NO WBC SEEN NO ORGANISMS SEEN   Final   Culture   Final    NO GROWTH 3 DAYS Performed at [facility], [address], [place], [place] [address]    Report Status 08/08/2022 FINAL  Final  Fungus Culture Result     Status: None   Collection Time: 08/05/22 10:23 AM  Result Value Ref Range Status   Result 1 Comment  Final    Comment: (NOTE) KOH/Calcofluor preparation:  no fungus observed. Performed At: [facility] [address] [place], [place] [id number] Jolene Schimke MD OE:[id number]   Aspergillus Ag, BAL/Serum     Status: None   Collection Time: 08/05/22 10:32 AM   Specimen: Bronchial Alveolar Lavage  Result Value Ref Range Status   Aspergillus Ag,  BAL/Serum 0.06 0.00 - 0.49 Index Final    Comment: (NOTE) Performed At: [facility] [address] [place], [place] [id number] Jolene Schimke MD WU:[id number]   Pneumocystis smear by DFA     Status: None   Collection Time: 08/05/22 10:32 AM   Specimen: Bronchial Wash; Respiratory  Result Value Ref Range Status   Specimen [facility] BRONCHIAL ALVEOLAR LAVAGE  Final    Comment: L   Pneumocystis jiroveci Ag NEGATIVE  Final    Comment: Performed at [facility] Performed at [facility], [address] [place], [place] [address]   MRSA Next Gen by PCR, Nasal     Status: None   Collection Time: 08/05/22  7:07 PM   Specimen: Nasal Mucosa; Nasal Swab  Result Value Ref Range Status   MRSA by PCR Next Gen NOT DETECTED NOT DETECTED Final    Comment: (NOTE) The GeneXpert MRSA Assay (FDA approved for NASAL specimens only), is one component of a comprehensive MRSA colonization surveillance program. It is not intended to diagnose MRSA infection  nor to guide or monitor treatment for MRSA infections. Test performance is not FDA approved in patients less than 35 years old. Performed at [facility], [address], [place], [place] [address]   Blastomyces Antigen     Status: None   Collection Time: 08/06/22  3:16 AM   Specimen: Blood  Result Value Ref Range Status   Blastomyces Antigen None Detected None Detected ng/mL Final    Comment: (NOTE) Reference Interval: None Detected Reportable Range: 0.31 ng/mL - 20.00 ng/mL Results above 20.00 ng/mL are reported as 'Positive, Above the Limit of Quantification' This test was developed and its performance characteristics determined by [employer]. It has not been cleared or approved by the FDA; however, FDA clearance or approval is not currently required for clinical use. The results are not intended to be used as the sole means for clinical diagnosis or patient decisions.    Specimen Type SERUM  Final    Comment: (NOTE) Performed At: [facility] [address] [place], [place] [id number] Phylis Bougie MD MW:[id number]   Fungus culture, blood     Status: None (Preliminary result)   Collection Time: 08/09/22  4:14 PM   Specimen: BLOOD  Result Value Ref Range Status   Specimen Description   Final    BLOOD SITE NOT SPECIFIED Performed at [facility], [address] [place], [place] [address]    Special Requests   Final    BOTTLES DRAWN AEROBIC ONLY Blood Culture results may not be optimal due to an inadequate volume of blood received in culture bottles Performed at [facility], [address], [place], [place] [address]    Culture   Final    NO GROWTH 3 DAYS Performed at [facility], [address] [place], [place] [address]    Report Status PENDING  Incomplete     Radiology Studies: DG Chest Port 1 View  Result Date: 08/13/2022 CLINICAL DATA:  Respiratory complication with  dyspnea EXAM: PORTABLE CHEST 1 VIEW COMPARISON:  08/09/2022 radiograph and chest CT from 3 days ago FINDINGS: Marked worsening of severe bilateral airspace disease compared to radiograph but similar to chest CT comparison. Pleural fluid is possible at the left base. No pneumothorax. Stable mediastinal contours. No cardiomegaly. IMPRESSION: Extensive airspace disease similar to CT from 3 days ago. Electronically Signed   By: Audry Riles.D.  On: 08/13/2022 04:10   CT BONE MARROW BIOPSY  Result Date: 08/12/2022 INDICATION: 67 year old female with history of myelodysplastic syndrome. EXAM: CT-GUIDED BONE MARROW BIOPSY AND ASPIRATION MEDICATIONS: None ANESTHESIA/SEDATION: Fentanyl 50 mcg IV; Versed 1 mg IV Sedation Time: 10 minutes; The patient was continuously monitored during the procedure by the interventional radiology nurse under my direct supervision. COMPLICATIONS: None immediate. PROCEDURE: Informed consent was obtained from the patient following an explanation of the procedure, risks, benefits and alternatives. The patient understands, agrees and consents for the procedure. All questions were addressed. A time out was performed prior to the initiation of the procedure. The patient was positioned prone and non-contrast localization CT was performed of the pelvis to demonstrate the iliac marrow spaces. The operative site was prepped and draped in the usual sterile fashion. Under sterile conditions and local anesthesia, a 22 gauge spinal needle was utilized for procedural planning. Next, an 11 gauge coaxial bone biopsy needle was advanced into the right iliac marrow space. Needle position was confirmed with CT imaging. Initially, a bone marrow aspiration was performed. Next, a bone marrow biopsy was obtained with the 11 gauge outer bone marrow device. Samples were prepared with the cytotechnologist and deemed adequate. The needle was removed and superficial hemostasis was obtained with manual  compression. A dressing was applied. The patient tolerated the procedure well without immediate post procedural complication. IMPRESSION: Successful CT guided right iliac bone marrow aspiration and core biopsy. Marliss Coots, MD [facility] Electronically Signed   By: Marliss Coots M.D.   On: 08/12/2022 11:35     Pamella Pert, MD, PhD [facility]  Between 7 am - 7 pm I am available, please contact me via Amion (for emergencies) or Securechat (non urgent messages)  Between 7 pm - 7 am I am not available, please contact night coverage MD/APP via Amion

## 2022-08-14 DIAGNOSIS — R509 Fever, unspecified: Secondary | ICD-10-CM | POA: Diagnosis not present

## 2022-08-14 DIAGNOSIS — J90 Pleural effusion, not elsewhere classified: Secondary | ICD-10-CM | POA: Diagnosis not present

## 2022-08-14 DIAGNOSIS — D72819 Decreased white blood cell count, unspecified: Secondary | ICD-10-CM

## 2022-08-14 DIAGNOSIS — Z515 Encounter for palliative care: Secondary | ICD-10-CM

## 2022-08-14 DIAGNOSIS — J189 Pneumonia, unspecified organism: Secondary | ICD-10-CM | POA: Diagnosis not present

## 2022-08-14 DIAGNOSIS — R4589 Other symptoms and signs involving emotional state: Secondary | ICD-10-CM

## 2022-08-14 DIAGNOSIS — Z711 Person with feared health complaint in whom no diagnosis is made: Secondary | ICD-10-CM

## 2022-08-14 DIAGNOSIS — Z66 Do not resuscitate: Secondary | ICD-10-CM

## 2022-08-14 DIAGNOSIS — R5081 Fever presenting with conditions classified elsewhere: Secondary | ICD-10-CM | POA: Diagnosis not present

## 2022-08-14 DIAGNOSIS — D709 Neutropenia, unspecified: Secondary | ICD-10-CM | POA: Diagnosis not present

## 2022-08-14 DIAGNOSIS — Z7189 Other specified counseling: Secondary | ICD-10-CM

## 2022-08-14 LAB — COMPREHENSIVE METABOLIC PANEL
ALT: 13 U/L (ref 0–44)
AST: 20 U/L (ref 15–41)
Albumin: 1.7 g/dL — ABNORMAL LOW (ref 3.5–5.0)
Alkaline Phosphatase: 83 U/L (ref 38–126)
Anion gap: 11 (ref 5–15)
BUN: 30 mg/dL — ABNORMAL HIGH (ref 8–23)
CO2: 26 mmol/L (ref 22–32)
Calcium: 7.9 mg/dL — ABNORMAL LOW (ref 8.9–10.3)
Chloride: 97 mmol/L — ABNORMAL LOW (ref 98–111)
Creatinine, Ser: 0.57 mg/dL (ref 0.44–1.00)
GFR, Estimated: >60 mL/min (ref >60)
Glucose, Bld: 340 mg/dL — ABNORMAL HIGH (ref 70–99)
Potassium: 4.9 mmol/L (ref 3.5–5.1)
Sodium: 134 mmol/L — ABNORMAL LOW (ref 135–145)
Total Bilirubin: 0.9 mg/dL (ref 0.3–1.2)
Total Protein: 7.2 g/dL (ref 6.5–8.1)

## 2022-08-14 LAB — GLUCOSE, CAPILLARY
Glucose-Capillary: 399 mg/dL — ABNORMAL HIGH (ref 70–99)
Glucose-Capillary: 372 mg/dL — ABNORMAL HIGH (ref 70–99)
Glucose-Capillary: 200 mg/dL — ABNORMAL HIGH (ref 70–99)

## 2022-08-14 LAB — CBC
HCT: 24.6 % — ABNORMAL LOW (ref 36.0–46.0)
Hemoglobin: 7.9 g/dL — ABNORMAL LOW (ref 12.0–15.0)
MCH: 28.8 pg (ref 26.0–34.0)
MCHC: 32.1 g/dL (ref 30.0–36.0)
MCV: 89.8 fL (ref 80.0–100.0)
Platelets: 23 10*3/uL — CL (ref 150–400)
RBC: 2.74 MIL/uL — ABNORMAL LOW (ref 3.87–5.11)
RDW: 14.8 % (ref 11.5–15.5)
WBC: <0.1 10*3/uL — CL (ref 4.0–10.5)
nRBC: 0 % (ref 0.0–0.2)

## 2022-08-14 LAB — MAGNESIUM: Magnesium: 2.2 mg/dL (ref 1.7–2.4)

## 2022-08-14 MED ORDER — GUAIFENESIN ER 600 MG PO TB12: 1200.0000 mg | ORAL_TABLET | Freq: Two times a day (BID) | ORAL | Status: DC | PRN

## 2022-08-14 MED ORDER — DEXTROMETHORPHAN POLISTIREX ER 30 MG/5ML PO SUER
15.0000 mg | Freq: Once | ORAL | Status: AC
  Administered 2022-08-14: 15 mg via ORAL
  Filled 2022-08-14: qty 5

## 2022-08-14 MED ORDER — ORAL CARE MOUTH RINSE: 15.0000 mL | OROMUCOSAL | Status: DC | PRN

## 2022-08-14 NOTE — Progress Notes (Signed)
PROGRESS NOTE  ANH BIGOS YQM:[id number] DOB: 02-Jul-1955 DOA: 07/29/2022 PCP: Claiborne Rigg, NP   LOS: 16 days   Brief Narrative / Interim history: Amanda Duarte is a 67 y.o. 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 She is also febrile and being treated for multifocal pneumonia.   Significant recent events: October 2023- she had azacitidine injection.  She was admitted to hospital after chemo, for severe profound pancytopenia, aspiration pneumonia, required intubation.   January 2024-started on 2 cycles of reduced azacitidine   She was also evaluted at [facility] for possible bone marrow transplant. There was concern for disease patient given worsening cytopenias. 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 June, 2024 - hospitalized, thought to be a periorbital colitis and potential ostiomeatal sinusitis based on imaging. She was given broad-spectrum bacterial therapy form of vancomycin cefepime and metronidazole.  She was seen by ophthalmology and per recommendation, patient was transferred to [facility].  Over there, she was seen by ophthalmology and ENT.  Per family's account, at [facility], she had an IND of occluded tear duct, no growth in the culture.  She was discharged from the hospital in about 3 days.  Outpatient follow-up showed patent tear ducts. Back to her chemotherapy twice a week was recently held due to worsening cytopenia.  She was referred to leukemia specialist Dr. Sharyne Richters. In the interim she developed fevers though without other focal symptoms.  - On 7/5, she was seen by oncology and found to be febrile to 100.4.  Her platelets dropped to 14,000.  She was given a platelet transfusion.  Chest x-ray showed some peribronchial thickening, and patient was sent to the ED and was admitted.  She was started on broad-spectrum antibiotics, however despite  coverage she remained febrile - 7/9, ID was consulted - 7/9, CT of the orbits showed resolved right orbital cellulitis and unresolved paranasal sinus inflammation now more bilateral and symmetric - 7/9, CT chest showed multifocal airspace disease with tree-in-bud and nodular opacities throughout all lobes of both lungs. - 7/11, with worsening fever, PCCM was consulted to assist bronchoscopy. - 7/12, underwent bronchoscopy.  Per report, copious amount of light green secretions were noted in the left lower lobe bronchus,.  Suctioned out.  No endobronchial lesions seen.  BAL subsequently resulted negative for malignant cells, negative culture, negative for pneumocystis, Aspergillus, AFB smear - 7/17, CT chest showed extensive infiltrates in both lungs right more than left, no significant pleural effusion.  Hilar and mediastinal lymphadenopathy suggestive of reactive hyperplasia -7/19, bone marrow biopsy to rule out acute leukemia as a complication of prior chemotherapy -7/20, worsening respiratory status, PCCM consulted, started on steroids, also DNR  Subjective / 24h Interval events: Feeling a little bit better today.  No longer had to use the BiPAP last night  Assesement and Plan: Principal Problem:   Neutropenic fever (HCC) Active Problems:   Multifocal pneumonia   Pleural effusion on left   Pleural effusion  Principal problem Neutropenic fever, sepsis Due to Multifocal Pneumonia- ID following, currently on meropenem. Most recent CT scan of the chest on 7/17 with extensive bilateral infiltrates, worsened since prior imaging.  -continue antibiotics with meropenem and Cresemba -ID and oncology following.  -Due to worsening respiratory status pulmonary consulted again 7/20, now on high-dose steroids  Active problems Pancytopenia, underlying MDS - requiring multiple transfusions, total of 9 units of platelets and 2 of pRBC  so far. Continue to monitor   Acute respiratory failure with hypoxia  - Due to multifocal pneumonia.  Echo 7/13 showed EF 50 to 55%, no WMA -No longer needs BiPAP but remains on 12 L  Hypertension - Blood pressure fluctuating. Previously on amlodipine and losartan.  Currently on hold.   Acute Metabolic Encephalopathy, Elevated ammonia level - Mental status altered probably because of high fever and infection.  Today she is very sleepy and lethargic  UTI - Urine culture grew E. coli, pansensitive   Hyponatremia - Sodium running low but overall stable.  Trend.  Poor oral intake  Type 2 diabetes mellitus uncontrolled with hyperglycemia - A1c 11.9 in June 2024. Currently on Semglee 10 units twice daily, insulin aspart 5 units 3 times daily as well as Premeal SSI/Accu-Cheks  CBG (last 3)  Recent Labs    08/13/22 1628 08/13/22 2150 08/14/22 0827  GLUCAP 224* 338* 399*    Scheduled Meds:  sodium chloride   Intravenous Once   Chlorhexidine Gluconate Cloth  6 each Topical Daily   insulin aspart  0-20 Units Subcutaneous TID WC   insulin aspart  0-5 Units Subcutaneous QHS   insulin aspart  5 Units Subcutaneous TID WC   insulin glargine-yfgn  15 Units Subcutaneous BID   Isavuconazonium Sulfate  372 mg Oral Daily   methylPREDNISolone (SOLU-MEDROL) injection  125 mg Intravenous Q6H   mouth rinse  15 mL Mouth Rinse 4 times per day   pantoprazole  40 mg Oral BID   Continuous Infusions:  meropenem (MERREM) IV Stopped (08/14/22 9562)   PRN Meds:.acetaminophen **OR** acetaminophen, albuterol, fentaNYL (SUBLIMAZE) injection, guaiFENesin, ondansetron **OR** ondansetron (ZOFRAN) IV, mouth rinse, oxyCODONE, traZODone  Current Outpatient Medications  Medication Instructions   acetaminophen (TYLENOL) 650 mg, Oral, Every 4 hours PRN   amLODipine (NORVASC) 5 mg, Oral, Daily, For blood pressure   atorvastatin (LIPITOR) 40 mg, Oral, Daily, For cholesterol   Basaglar KwikPen 20 Units, Subcutaneous, 2 times daily   Blood Glucose Monitoring Suppl (ONETOUCH VERIO) w/Device  KIT Use to check blood sugar three times daily. E11.65   Blood Pressure Monitor DEVI Please provide patient with insurance approved blood pressure monitor   carboxymethylcellulose (REFRESH PLUS) 0.5 % SOLN 1 drop, Right Eye, 4 times daily   ceFEPIme 2 g in sodium chloride 0.9 % 100 mL 2 g, Intravenous, Every 12 hours   citalopram (CELEXA) 20 mg, Oral, Daily, For depression   Continuous Glucose Receiver (FREESTYLE LIBRE READER) DEVI Monitor blood glucose levels 5-6 times per day E11.65   Continuous Glucose Sensor (FREESTYLE LIBRE 2 SENSOR) MISC Monitor blood glucose levels 5-6 times per day E11.65   erythromycin ophthalmic ointment 1 Application, Right Eye, 2 times daily   fenofibrate (TRICOR) 145 mg, Oral, Daily, For cholesterol   fluticasone (FLONASE) 50 MCG/ACT nasal spray 2 sprays, Each Nare, Daily   glucose blood test strip Use to check blood sugar three times daily. E11.65   HumaLOG KwikPen 10 Units, Subcutaneous, 3 times daily   Insulin Pen Needle 31G X 5 MM MISC 1 Device, Subcutaneous, 4 times daily, For use with insulin pens   Insulin Syringe-Needle U-100 30G X 5/16" 1 ML MISC Use as directed to inject into the skin 3 times daily.   Isavuconazonium Sulfate (CRESEMBA) 186 MG CAPS Take 2 capsules (372 mg total) by mouth every 8 (eight) hours for 2 days, THEN 2 capsules (372 mg total) daily for 12 days.   levofloxacin (LEVAQUIN) 500 mg, Oral, Daily   losartan (  COZAAR) 50 mg, Oral, Daily   metroNIDAZOLE (FLAGYL) 500 mg, Oral, Every 12 hours   ondansetron (ZOFRAN) 8 mg, Oral, Every 8 hours PRN   OneTouch Delica Lancets 33G MISC Use to check blood sugar three times daily. E11.65   oxyCODONE (OXY IR/ROXICODONE) 5 mg, Oral, Every 4 hours PRN, Take 0.5 to 1 tablet (2.5-5 mg total) by mouth every 4 (four) hours as needed for up to 5 days   pantoprazole (PROTONIX) 40 mg, Oral, Daily, For heartburn   potassium chloride SA (KLOR-CON M) 20 MEQ tablet 20 mEq, Oral, Daily   prochlorperazine  (COMPAZINE) 10 mg, Oral, Every 6 hours PRN   sitaGLIPtin-metformin (JANUMET) 50-1000 MG tablet 1 tablet, Oral, 2 times daily with meals   vancomycin (VANCOREADY) 750 mg, Intravenous, Every 24 hours    Diet Orders (From admission, onward)     Start     Ordered   08/12/22 2117  Diet Carb Modified Fluid consistency: Thin; Room service appropriate? Yes  Diet effective now       Question Answer Comment  Diet-HS Snack? Nothing   Calorie Level Medium 1600-2000   Fluid consistency: Thin   Room service appropriate? Yes      08/12/22 2116            DVT prophylaxis: SCDs Start: 07/29/22 1726   Lab Results  Component Value Date   PLT 23 (LL) 08/14/2022      Code Status: DNR  Family Communication: no family at bedside   Status is: Inpatient Remains inpatient appropriate because: severity of illness  Level of care: Stepdown  Consultants:  Oncology ID PCCM  Objective: Vitals:   08/14/22 0246 08/14/22 0400 08/14/22 0600 08/14/22 0830  BP:  (!) 94/44 (!) 123/48   Pulse: 100 73 78   Resp: (!) 28 (!) 22 20   Temp:  97.6 F (36.4 C)  97.9 F (36.6 C)  TempSrc:  Oral  Oral  SpO2:  97% 96%   Weight:  52.9 kg    Height:        Intake/Output Summary (Last 24 hours) at 08/14/2022 1010 Last data filed at 08/14/2022 4098 Gross per 24 hour  Intake 1091.76 ml  Output --  Net 1091.76 ml   Wt Readings from Last 3 Encounters:  08/14/22 52.9 kg  07/09/22 55 kg  07/01/22 53.5 kg    Examination:  Constitutional: NAD Eyes: lids and conjunctivae normal, no scleral icterus ENMT: mmm Neck: normal, supple Respiratory: Bilateral rhonchi, no wheezing Cardiovascular: Regular rate and rhythm, no murmurs / rubs / gallops. No LE edema. Abdomen: soft, no distention, no tenderness. Bowel sounds positive.  Skin: no rashes  Data Reviewed: I have independently reviewed following labs and imaging studies   CBC Recent Labs  Lab 08/09/22 0550 08/10/22 0307 08/11/22 0738  08/12/22 0343 08/13/22 0253 08/14/22 0255  WBC 1.0* 0.9* 0.8* 1.1* 1.0* <0.1*  HGB 9.8* 8.2* 8.6* 7.8* 8.1* 7.9*  HCT 27.6* 24.3* 25.9* 22.6* 24.6* 24.6*  PLT 24* 6* 28* 19* 11* 23*  MCV 95.5 94.2 89.9 92.2 89.8 89.8  MCH 33.9 31.8 29.9 31.8 29.6 28.8  MCHC 35.5 33.7 33.2 34.5 32.9 32.1  RDW 17.2* 15.3 14.5 15.0 14.7 14.8  LYMPHSABS 0.8 0.5* 0.5* 0.6* 0.3*  --   MONOABS 0.1 0.2 0.1 0.4 0.5  --   EOSABS 0.0 0.0 0.0 0.0 0.0  --   BASOSABS 0.0 0.0 0.0 0.0 0.0  --     Recent Labs  Lab 08/08/22 0325  08/09/22 0550 08/10/22 0307 08/11/22 0319 08/11/22 0738 08/12/22 0343 08/13/22 0253 08/14/22 0255  NA 132* 130* 130* 132*  --  130* 131* 134*  K 3.8 4.2 3.4* 3.4*  --  3.8 4.1 4.9  CL 98 95* 95* 94*  --  96* 95* 97*  CO2 24 26 25 29   --  28 27 26   GLUCOSE 194* 105* 95 83  --  88 254* 340*  BUN 9 9 8 8   --  11 12 30*  CREATININE 0.36* 0.39* 0.35* 0.35*  --  0.39* 0.42* 0.57  CALCIUM 7.4* 7.6* 7.3* 7.2*  --  7.6* 7.5* 7.9*  AST 31  --  24  --   --   --  20 20  ALT 18  --  14  --   --   --  13 13  ALKPHOS 87  --  78  --   --   --  97 83  BILITOT 0.9  --  0.9  --   --   --  1.1 0.9  ALBUMIN 2.0*  --  1.8*  --   --   --  1.6* 1.7*  MG 1.4* 1.8 1.6* 1.4*  --   --  1.7 2.2  PROCALCITON  --   --   --   --  3.78  --   --   --   LATICACIDVEN  --   --   --  1.4  --   --   --   --   AMMONIA  --   --   --   --  61*  --   --   --     ------------------------------------------------------------------------------------------------------------------ No results for input(s): "CHOL", "HDL", "LDLCALC", "TRIG", "CHOLHDL", "LDLDIRECT" in the last 72 hours.  Lab Results  Component Value Date   HGBA1C 11.9 (H) 07/10/2022   ------------------------------------------------------------------------------------------------------------------ No results for input(s): "TSH", "T4TOTAL", "T3FREE", "THYROIDAB" in the last 72 hours.  Invalid input(s): "FREET3"  Cardiac Enzymes No results for input(s):  "CKMB", "TROPONINI", "MYOGLOBIN" in the last 168 hours.  Invalid input(s): "CK" ------------------------------------------------------------------------------------------------------------------    Component Value Date/Time   BNP 274.2 (H) 08/05/2022 1659    CBG: Recent Labs  Lab 08/13/22 0827 08/13/22 1213 08/13/22 1628 08/13/22 2150 08/14/22 0827  GLUCAP 238* 129* 224* 338* 399*    Recent Results (from the past 240 hour(s))  Expectorated Sputum Assessment w Gram Stain, Rflx to Resp Cult     Status: None   Collection Time: 08/04/22  7:44 PM   Specimen: Expectorated Sputum  Result Value Ref Range Status   Specimen Description EXPECTORATED SPUTUM  Final   Special Requests NONE  Final   Sputum evaluation   Final    Sputum specimen not acceptable for testing.  Please recollect.   NOTIFIED JEREMY RN Performed at [facility], [address], [place], [place] [address]    Report Status 08/04/2022 FINAL  Final  Fungus Culture With Stain     Status: None (Preliminary result)   Collection Time: 08/05/22 10:23 AM   Specimen: PATH Cytology Bronchial lavage; Body Fluid  Result Value Ref Range Status   Fungus Stain Final report  Final    Comment: (NOTE) Performed At: [facility] [address] [place], [place] [id number] Jolene Schimke MD [id number]    Fungus (Mycology) Culture PENDING  Incomplete   Fungal Source BRONCHIAL ALVEOLAR LAVAGE  Final    Comment: L Performed at [facility], [address]  [place], [place] [address]   Acid Fast Smear (AFB)     Status: None   Collection Time: 08/05/22 10:23 AM   Specimen: PATH Cytology Bronchial lavage; Body Fluid  Result Value Ref Range Status   AFB Specimen Processing Concentration  Final   Acid Fast Smear Negative  Final    Comment: (NOTE) Performed At: [facility] [address] [place], [place] [id number] Jolene Schimke MD KV:[id number]    Source (AFB) BRONCHIAL  ALVEOLAR LAVAGE  Final    Comment: L Performed at [facility], [address] [place], [place] [address]   Body fluid culture w Gram Stain     Status: None   Collection Time: 08/05/22 10:23 AM   Specimen: PATH Cytology Bronchial lavage; Body Fluid  Result Value Ref Range Status   Specimen Description   Final    BRONCHIAL ALVEOLAR LAVAGE L Performed at [facility], [address] [place], [place] [address]    Special Requests   Final    NONE Performed at [facility], [address], [place], [place] [address]    Gram Stain NO WBC SEEN NO ORGANISMS SEEN   Final   Culture   Final    NO GROWTH 3 DAYS Performed at [facility], [address], [place], [place] [address]    Report Status 08/08/2022 FINAL  Final  Fungus Culture Result     Status: None   Collection Time: 08/05/22 10:23 AM  Result Value Ref Range Status   Result 1 Comment  Final    Comment: (NOTE) KOH/Calcofluor preparation:  no fungus observed. Performed At: [facility] [address] [place], [place] [id number] Jolene Schimke MD DD:[id number]   Aspergillus Ag, BAL/Serum     Status: None   Collection Time: 08/05/22 10:32 AM   Specimen: Bronchial Alveolar Lavage  Result Value Ref Range Status   Aspergillus Ag, BAL/Serum 0.06 0.00 - 0.49 Index Final    Comment: (NOTE) Performed At: [facility] [address] [place], [place] [id number] Jolene Schimke MD VV:[id number]   Pneumocystis smear by DFA     Status: None   Collection Time: 08/05/22 10:32 AM   Specimen: Bronchial Wash; Respiratory  Result Value Ref Range Status   Specimen Source-PJSRC BRONCHIAL ALVEOLAR LAVAGE  Final    Comment: L   Pneumocystis jiroveci Ag NEGATIVE  Final    Comment: Performed at [facility] Performed at [facility], [address] [place], [place] [address]   MRSA Next Gen by PCR, Nasal     Status: None    Collection Time: 08/05/22  7:07 PM   Specimen: Nasal Mucosa; Nasal Swab  Result Value Ref Range Status   MRSA by PCR Next Gen NOT DETECTED NOT DETECTED Final    Comment: (NOTE) The GeneXpert MRSA Assay (FDA approved for NASAL specimens only), is one component of a comprehensive MRSA colonization surveillance program. It is not intended to diagnose MRSA infection nor to guide or monitor treatment for MRSA infections. Test performance is not FDA approved in patients less than 12 years old. Performed at [facility], [address], [place], [place] [address]   Blastomyces Antigen     Status: None   Collection Time: 08/06/22  3:16 AM   Specimen: Blood  Result Value Ref Range Status   Blastomyces Antigen None Detected None Detected ng/mL Final    Comment: (NOTE) Reference Interval: None Detected Reportable Range: 0.31  ng/mL - 20.00 ng/mL Results above 20.00 ng/mL are reported as 'Positive, Above the Limit of Quantification' This test was developed and its performance characteristics determined by [employer]. It has not been cleared or approved by the FDA; however, FDA clearance or approval is not currently required for clinical use. The results are not intended to be used as the sole means for clinical diagnosis or patient decisions.    Specimen Type SERUM  Final    Comment: (NOTE) Performed At: [facility] [address] [place], [place] [id number] Phylis Bougie MD MV:[id number]   Fungus culture, blood     Status: None (Preliminary result)   Collection Time: 08/09/22  4:14 PM   Specimen: BLOOD  Result Value Ref Range Status   Specimen Description   Final    BLOOD SITE NOT SPECIFIED Performed at [facility], [address], [place], [place] [address]    Special Requests   Final    BOTTLES DRAWN AEROBIC ONLY Blood Culture results may not be optimal due to an inadequate volume of blood received in culture  bottles Performed at [facility], [address], [place], [place] [address]    Culture   Final    NO GROWTH 4 DAYS Performed at [facility], [address], [place], [place] [address]    Report Status PENDING  Incomplete     Radiology Studies: No results found.   Pamella Pert, MD, PhD [facility]  Between 7 am - 7 pm I am available, please contact me via Amion (for emergencies) or Securechat (non urgent messages)  Between 7 pm - 7 am I am not available, please contact night coverage MD/APP via Amion

## 2022-08-14 NOTE — Progress Notes (Signed)
NAME:  Amanda Duarte, MRN:  [id number], DOB:  January 16, 1956, LOS: 16 ADMISSION DATE:  07/29/2022, CONSULTATION DATE:  7/20 REFERRING MD:  Risa Grill, CHIEF COMPLAINT:  respiratory failure   History of Present Illness:  Mrs. Kawa is a 67 year old woman with a history of breast cancer and subsequent myelodysplastic syndrome who presented on 7/5 with fever and thrombocytopenia.  Since admission she has been on vancomycin, micafungin > Cresemba, cefepime > meropenem.  She has had progressive respiratory failure but no identification of organism despite bronchoscopy with cultures.  She has been pancytopenic throughout the admission.  Throughout her admissions she has had fevers nearly daily as high as 103, yesterday Tmax 101.7.  Despite aggressive antibiotics she has failed to improve from a pulmonary standpoint; she has worsened hypoxia and is now requiring BiPAP, only tolerating about 20 minutes off at a time.  Her FiO2 has been increasing. Today she is more sleepy than previous days.  She feels comfortable on BiPAP.   Pertinent  Medical History  Breast cancer s/o mastectomy, chemo, radiation Dysplastic syndrome Cerebellar stroke Hypertension Hyperlipidemia Diabetes Periorbital cellulitis Significant Hospital Events: Including procedures, antibiotic start and stop dates in addition to other pertinent events   7/5 admitted 7/12 bronch with bronchial wash 7/19 BM biopsy 7/20 required bipap overnight for escalating O2 requirements  Interim History / Subjective:  Today her breathing feels improved.  Still having issues with coughing.  Objective   Blood pressure (!) 123/48, pulse 78, temperature 97.6 F (36.4 C), temperature source Oral, resp. rate 20, height 5\' 1"  (1.549 m), weight 52.9 kg, SpO2 96%.    FiO2 (%):  [50 %-80 %] 65 %   Intake/Output Summary (Last 24 hours) at 08/14/2022 0865 Last data filed at 08/14/2022 7846 Gross per 24 hour  Intake 1091.76 ml  Output --  Net 1091.76 ml    Filed Weights   08/12/22 0500 08/13/22 0423 08/14/22 0400  Weight: 54.3 kg 52.1 kg 52.9 kg    Examination: General: Chronically ill-appearing woman sitting up in bed no acute distress HENT: [place]/AT, eyes anicteric Lungs: rhales bilaterally, breathing comfortably on HCNC, no accessory muscle use to conversational dyspnea Cardiovascular: S1S2, RRR Abdomen: soft, NT, ND Extremities:no c/c/e Neuro: more awake and alert, moving all extremities, comprehension appears intact Derm: no diffuse rashes  Sodium 134 BUN 30 Creatinine 0.57 White blood cell count <0.1 H/H 7.9/24.6 Platelets 23 CXR personally reviewed-sitting right upper lobe infiltrate, persistent bibasilar opacities CT chest personally reviewed-dense groundglass opacities throughout most lobes of the lungs.  Resolved Hospital Problem list     Assessment & Plan:   Acute respiratory failure with hypoxia- patchy bilateral groundglass opacities.  Differential diagnosis includes infection, which is most likely with neutropenia.  Cultures so far have been negative.  Leukemic infiltration of the pulmonary parenchyma is possible, which seems likely with improvement on steroids but is most commonly seen with very high WBC, not neutropenic. Post infectious inflammation is also possible, but also seems less likely with profound leukepenia. -con't broad antimicrobials -Continue high-dose steroids-telemetry 125 mg every 6 hours for 3 days, then can slowly taper off -Continue PPI with high dose steroids given increased GI bleed risk -Continue high flow nasal cannula, wean supplemental oxygen as able, continues BiPAP as needed -Appears euvolemic -recommend against intubation at this point if she progresses- see ipal note from 7/20 -Repeat chest x-ray tomorrow morning  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; aplastic anemia is also possible -Oncology following, appreciate their assistance--  per Onc she is  a poor candidate for intensive  therapy at this point and has a poor prognosis for her disease. - Bone marrow biopsy pending from 7/19  Uncontrolled hyperglycemia; worse with steroids -Insulin increased per primary, may end up needing an insulin infusion   Best Practice (right click and "Reselect all SmartList Selections" daily)   Diet/type: Regular consistency (see orders) and NPO DVT prophylaxis: SCD due to thrombocytopenia GI prophylaxis: PPI Lines: N/A Foley:  N/A Code Status:  DNR Last date of multidisciplinary goals of care discussion [7/20- see ipal note]  Labs   CBC: Recent Labs  Lab 08/09/22 0550 08/10/22 0307 08/11/22 0738 08/12/22 0343 08/13/22 0253 08/14/22 0255  WBC 1.0* 0.9* 0.8* 1.1* 1.0* <0.1*  NEUTROABS 0.2* 0.1* 0.1* 0.1* 0.2*  --   HGB 9.8* 8.2* 8.6* 7.8* 8.1* 7.9*  HCT 27.6* 24.3* 25.9* 22.6* 24.6* 24.6*  MCV 95.5 94.2 89.9 92.2 89.8 89.8  PLT 24* 6* 28* 19* 11* 23*    Basic Metabolic Panel: Recent Labs  Lab 08/08/22 0325 08/09/22 0550 08/10/22 0307 08/11/22 0319 08/12/22 0343 08/13/22 0253 08/14/22 0255  NA 132* 130* 130* 132* 130* 131* 134*  K 3.8 4.2 3.4* 3.4* 3.8 4.1 4.9  CL 98 95* 95* 94* 96* 95* 97*  CO2 24 26 25 29 28 27 26   GLUCOSE 194* 105* 95 83 88 254* 340*  BUN 9 9 8 8 11 12  30*  CREATININE 0.36* 0.39* 0.35* 0.35* 0.39* 0.42* 0.57  CALCIUM 7.4* 7.6* 7.3* 7.2* 7.6* 7.5* 7.9*  MG 1.4* 1.8 1.6* 1.4*  --  1.7 2.2  PHOS 3.4  --   --  2.7  --   --   --    GFR: Estimated Creatinine Clearance: 51.5 mL/min (by C-G formula based on SCr of 0.57 mg/dL). Recent Labs  Lab 08/11/22 0319 08/11/22 0738 08/12/22 0343 08/13/22 0253 08/14/22 0255  PROCALCITON  --  3.78  --   --   --   WBC  --  0.8* 1.1* 1.0* <0.1*  LATICACIDVEN 1.4  --   --   --   --     Liver Function Tests: Recent Labs  Lab 08/08/22 0325 08/10/22 0307 08/13/22 0253 08/14/22 0255  AST 31 24 20 20   ALT 18 14 13 13   ALKPHOS 87 78 97 83  BILITOT 0.9 0.9 1.1 0.9  PROT 6.8 6.4* 7.0 7.2   ALBUMIN 2.0* 1.8* 1.6* 1.7*   No results for input(s): "LIPASE", "AMYLASE" in the last 168 hours. Recent Labs  Lab 08/11/22 0738  AMMONIA 61*    Critical care time:      Steffanie Dunn, DO 08/14/22 12:34 PM [facility]  For contact information, see Amion. If no response to pager, please call PCCM consult pager. After hours, 7PM- 7AM, please call Elink.

## 2022-08-14 NOTE — Plan of Care (Signed)
  Problem: Pain Managment: Goal: General experience of comfort will improve Outcome: Progressing   Problem: Clinical Measurements: Goal: Will remain free from infection Outcome: Not Progressing Goal: Diagnostic test results will improve Outcome: Not Progressing Goal: Respiratory complications will improve Outcome: Not Progressing   Problem: Coping: Goal: Level of anxiety will decrease Outcome: Completed/Met

## 2022-08-14 NOTE — TOC Progression Note (Signed)
Transition of Care [facility]) - Progression Note    Patient Details  Name: Amanda Duarte MRN: [id number] Date of Birth: 1955/11/11  Transition of Care [facility]) CM/SW Contact  Otelia Santee, LCSW Phone Number: 08/14/2022, 3:25 PM  Clinical Narrative:    Pt being recommended for home w/ hospice services.  A referral was made to [facility] for hospice services.  TOC will continue to follow.    Expected Discharge Plan: Home w Home Health Services Barriers to Discharge: Continued Medical Work up  Expected Discharge Plan and Services In-house Referral: NA Discharge Planning Services: CM Consult Post Acute Care Choice: Home Health Living arrangements for the past 2 months: Single Family Home                 DME Arranged: Walker rolling                     Social Determinants of Health (SDOH) Interventions SDOH Screenings   Food Insecurity: Food Insecurity Present (07/29/2022)  Housing: Medium Risk (07/29/2022)  Transportation Needs: No Transportation Needs (07/29/2022)  Utilities: At Risk (07/29/2022)  Alcohol Screen: Low Risk  (03/17/2022)  Depression (PHQ2-9): Low Risk  (05/16/2022)  Financial Resource Strain: Medium Risk (03/17/2022)  Physical Activity: Insufficiently Active (03/17/2022)  Social Connections: Moderately Isolated (03/17/2022)  Stress: No Stress Concern Present (03/17/2022)  Tobacco Use: Low Risk  (08/09/2022)    Readmission Risk Interventions    08/12/2022    5:02 PM 08/03/2022    1:20 PM 07/11/2022    2:50 PM  Readmission Risk Prevention Plan  Transportation Screening Complete Complete Complete  PCP or Specialist Appt within 3-5 Days  Complete Complete  HRI or Home Care Consult  Complete Complete  Social Work Consult for Recovery Care Planning/Counseling  Complete Complete  Palliative Care Screening  Not Applicable Not Applicable  Medication Review Oceanographer) Complete Complete Complete  PCP or Specialist appointment within 3-5 days of discharge Complete    HRI or  Home Care Consult Complete    SW Recovery Care/Counseling Consult Complete    Palliative Care Screening Not Applicable    Skilled Nursing Facility Not Applicable

## 2022-08-14 NOTE — Progress Notes (Signed)
[employer] [facility], [facility]) Hospital Liaison Note  Referral received for patient/family interest in hospice at home. [facility] liaison spoke with patient's daughter Amanda Duarte to confirm interest. Interest confirmed.   Plan is to discharge home either Monday or Tuesday.   DME ordered by [facility]: Hospital bed, BSC, over the bed table, and oxygen.   Please send comfort medications/prescriptions home with patient at discharge. This allow them to have access to comfort medications until admission visit takes place with [facility].   Please call with any questions or concerns. Thank you  Dionicio Stall, [facility] Liaison [phone]

## 2022-08-14 NOTE — Consult Note (Signed)
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  Since admission patient has received management in ICU.  The CCM consulted to perform bronchoscopy.  Patient has remained on broad-spectrum antibiotics due to respiratory status.  Bone marrow biopsy obtained 7/19 to rule out acute leukemia as complication of prior chemotherapy.  Oncology, Dr. Mosetta Putt consulted assist with recommendations.  Palliative medicine team consulted to assist with complex medical decision making.  As per Dr. Loretta Plume note on 08/11/2022, patient has an overall very poor prognosis even if bone marrow biopsy confirms has acute leukemia as if she is a poor candidate for intensive chemotherapy.  Presented to bedside to meet with patient.  Presented in a.m. and no family present at patient's bedside.  Patient laying in bed on high flow nasal cannula.  No family present at bedside.  Able to use Spanish interpreter technology for coordination.  With permission, we will to explore what patient has been hearing are updates from medical providers.  Patient notes that her lungs are "better".  She knows that the doctors are helping to provide her with aggressive medical interventions.  With her permission, able to discuss concerns that while maintaining currently, patient has an underlying medical condition that cannot be fixed and will lead to more infections moving forward.  Noted due to this,  patient's time is likely growing shorter.  Patient nodded that she heard this though encouraged provider to visit later in day when hopefully daughter would be at bedside.  Noted palliative medicine provider would follow-up as able.  All questions answered at that time.  Provided emotional support via active listening.  Thanked patient for allowing me to visit with her today.  Discussed care with bedside RN after visit.  Return to bedside later in afternoon once patient's daughter, Amanda Duarte, present at bedside with her husband.  Offered to engage interpreter though patient and family did not want this service.  Acknowledged and respected wishes.  Able to introduce myself and the role of the palliative medicine team.  Discussed patient's current medical illnesses including infections and pancytopenia that we cannot fix.  Discussed how patient wanted to focus on her time moving forward.  Able to discuss idea of comfort focused care and returning home with hospice to enjoy time at the end of her life.  Did express concern that patient's life is growing shorter on time period of weeks due to her severe lung disease and myelodysplastic syndrome with pancytopenia.  Spent time providing emotional support via active listening.  Describe what home hospice versus inpatient hospice would provide and not provide.  Discussed care with primary [facility] still fall to family.  Patient acknowledged all of this and noted that she wanted to be able to die in her own home so wanted to go home with hospice.  Daughter supporting of this.  Spent time answering questions as able.  No way to reach out to patient's daughter, Amanda Duarte, because Elane Fritz said she would be the one to coordinate  with hospice liaison.  Able to call patient's daughter, Amanda Duarte, to discuss care.  Introduced myself as a member of the palliative medicine team and spent time discussing all of patient's medical conditions with her as well.  Discussed patient being  at the end of life.  Discussed how patient wants to be able to die in her own home.  Discussed focusing on symptom management moving forward with home hospice support.  Spent time providing emotional support via active listening.  Daughter acknowledged that she knew her mother was not doing well because she has been coughing up blood and she knew she was reaching the end of life.  Family very supported with getting patient home where she would want to be when she dies.  Daughter even noted she agrees for hospice liaison to reach out to her to coordinate care particularly DME delivery.  All questions answered at that time.  Open patient can return home with hospice tomorrow or Tuesday.  Discussed care with IDT throughout the day.  Informed of plan to get patient home with hospice either tomorrow or Tuesday.  Continuing appropriate medical interventions at this time like antibiotics and steroids as this will allow for symptom management and time at home with family.  Primary Diagnoses  Present on Admission:  Neutropenic fever (HCC)  Multifocal pneumonia   Palliative Review of Systems: Breathing stable on high flow nasal cannula  Past Medical History:  Diagnosis Date   Cancer (HCC) 03/2017   left breast cancer   Diabetes mellitus without complication (HCC)    Hyperlipidemia    Hypertension    Malignant neoplasm of left breast (HCC)    Stroke (cerebrum) (HCC)    Tibial plateau fracture, left    04-13-17 had ORIF   Social History   Socioeconomic History   Marital status: Married    Spouse name: Not on file   Number of children: Not on file   Years of education: Not on file   Highest education level: Not on file  Occupational History   Not on file  Tobacco Use   Smoking status: Never   Smokeless tobacco: Never  Vaping Use   Vaping status: Never Used  Substance and Sexual Activity   Alcohol use: No   Drug use: No   Sexual activity: Not Currently    Birth control/protection: None   Other Topics Concern   Not on file  Social History Narrative   Not on file   Social Determinants of Health   Financial Resource Strain: Medium Risk (03/17/2022)   Overall Financial Resource Strain (CARDIA)    Difficulty of Paying Living Expenses: Somewhat hard  Food Insecurity: Food Insecurity Present (07/29/2022)   Hunger Vital Sign    Worried About Running Out of Food in the Last Year: Often true    Ran Out of Food in the Last Year: Sometimes true  Transportation Needs: No Transportation Needs (07/29/2022)   PRAPARE - Administrator, Civil Service (Medical): No    Lack of Transportation (Non-Medical): No  Physical Activity: Insufficiently Active (03/17/2022)   Exercise Vital Sign    Days of Exercise per Week: 4 days    Minutes of Exercise per Session: 10 min  Stress: No Stress Concern Present (03/17/2022)   [employer] of Occupational Health - Occupational Stress Questionnaire    Feeling of Stress : Only a little  Social Connections: Moderately Isolated (03/17/2022)   Social Connection and Isolation Panel [NHANES]    Frequency of Communication  with Friends and Family: More than three times a week    Frequency of Social Gatherings with Friends and Family: Three times a week    Attends Religious Services: More than 4 times per year    Active Member of Clubs or Organizations: No    Attends Banker Meetings: Never    Marital Status: Divorced   Family History  Problem Relation Age of Onset   Heart Problems Mother    Diabetes Son    Breast cancer Neg Hx    Colon cancer Neg Hx    Rectal cancer Neg Hx    Stomach cancer Neg Hx    Esophageal cancer Neg Hx    Scheduled Meds:  sodium chloride   Intravenous Once   Chlorhexidine Gluconate Cloth  6 each Topical Daily   insulin aspart  0-20 Units Subcutaneous TID WC   insulin aspart  0-5 Units Subcutaneous QHS   insulin aspart  5 Units Subcutaneous TID WC   insulin glargine-yfgn  15 Units Subcutaneous BID    Isavuconazonium Sulfate  372 mg Oral Daily   methylPREDNISolone (SOLU-MEDROL) injection  125 mg Intravenous Q6H   mouth rinse  15 mL Mouth Rinse 4 times per day   pantoprazole  40 mg Oral BID   Continuous Infusions:  meropenem (MERREM) IV Stopped (08/14/22 4098)   PRN Meds:.acetaminophen **OR** acetaminophen, albuterol, fentaNYL (SUBLIMAZE) injection, guaiFENesin, ondansetron **OR** ondansetron (ZOFRAN) IV, mouth rinse, oxyCODONE, traZODone No Known Allergies CBC:    Component Value Date/Time   WBC <0.1 (LL) 08/14/2022 0255   HGB 7.9 (L) 08/14/2022 0255   HGB 9.8 (L) 07/29/2022 0858   HGB 6.9 (LL) 10/13/2021 1009   HCT 24.6 (L) 08/14/2022 0255   HCT 20.9 (L) 10/13/2021 1009   PLT 23 (LL) 08/14/2022 0255   PLT 14 (L) 07/29/2022 0858   PLT 57 (LL) 10/13/2021 1009   MCV 89.8 08/14/2022 0255   MCV 106 (H) 10/13/2021 1009   NEUTROABS 0.2 (LL) 08/13/2022 0253   NEUTROABS 1.2 (L) 10/13/2021 1009   LYMPHSABS 0.3 (L) 08/13/2022 0253   LYMPHSABS 2.2 10/13/2021 1009   MONOABS 0.5 08/13/2022 0253   EOSABS 0.0 08/13/2022 0253   EOSABS 0.1 10/13/2021 1009   BASOSABS 0.0 08/13/2022 0253   BASOSABS 0.0 10/13/2021 1009   Comprehensive Metabolic Panel:    Component Value Date/Time   NA 134 (L) 08/14/2022 0255   NA 134 10/05/2021 0925   K 4.9 08/14/2022 0255   CL 97 (L) 08/14/2022 0255   CO2 26 08/14/2022 0255   BUN 30 (H) 08/14/2022 0255   BUN 8 10/05/2021 0925   CREATININE 0.57 08/14/2022 0255   CREATININE 0.59 07/26/2022 0748   GLUCOSE 340 (H) 08/14/2022 0255   CALCIUM 7.9 (L) 08/14/2022 0255   AST 20 08/14/2022 0255   AST 14 (L) 07/26/2022 0748   ALT 13 08/14/2022 0255   ALT 11 07/26/2022 0748   ALKPHOS 83 08/14/2022 0255   BILITOT 0.9 08/14/2022 0255   BILITOT 1.0 07/26/2022 0748   PROT 7.2 08/14/2022 0255   PROT 7.8 10/05/2021 0925   ALBUMIN 1.7 (L) 08/14/2022 0255   ALBUMIN 3.9 10/05/2021 0925    Physical Exam: Vital Signs: BP (!) 123/48   Pulse 78   Temp 97.6  F (36.4 C) (Oral)   Resp 20   Ht 5\' 1"  (1.549 m)   Wt 52.9 kg   SpO2 96%   BMI 22.04 kg/m  SpO2: SpO2: 96 % O2 Device: O2 Device: High  Flow Nasal Cannula O2 Flow Rate: O2 Flow Rate (L/min): 12 L/min Intake/output summary:  Intake/Output Summary (Last 24 hours) at 08/14/2022 0912 Last data filed at 08/14/2022 1610 Gross per 24 hour  Intake 1091.76 ml  Output --  Net 1091.76 ml   LBM: Last BM Date : 08/13/22 Baseline Weight: Weight: 55 kg Most recent weight: Weight: 52.9 kg  General: NAD, in bed, on high flow nasal cannula, chronically ill-appearing, frail Eyes: no drainage noted HENT: On high flow nasal cannula Cardiovascular: RRR Respiratory: Slightly increased work of breathing noted, on high flow nasal cannula, cough appreciated multiple times during visit Skin: no rashes or lesions on visible skin Neuro: Awake, interactive, answering questions appropriately as able          Palliative Performance Scale: 30%              Additional Data Reviewed: Recent Labs    08/13/22 0253 08/14/22 0255  WBC 1.0* <0.1*  HGB 8.1* 7.9*  PLT 11* 23*  NA 131* 134*  BUN 12 30*  CREATININE 0.42* 0.57    Imaging: DG Chest Port 1 View CLINICAL DATA:  Respiratory complication with dyspnea  EXAM: PORTABLE CHEST 1 VIEW  COMPARISON:  08/09/2022 radiograph and chest CT from 3 days ago  FINDINGS: Marked worsening of severe bilateral airspace disease compared to radiograph but similar to chest CT comparison. Pleural fluid is possible at the left base. No pneumothorax. Stable mediastinal contours. No cardiomegaly.  IMPRESSION: Extensive airspace disease similar to CT from 3 days ago.  Electronically Signed   By: Tiburcio Pea M.D.   On: 08/13/2022 04:10    I personally reviewed recent imaging.   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  Since admission patient has received management in ICU.  The CCM consulted to perform bronchoscopy.  Patient has remained on broad-spectrum antibiotics due to respiratory status.  Bone marrow biopsy obtained 7/19 to rule out acute leukemia as complication of prior chemotherapy.  Oncology, Dr. Mosetta Putt consulted assist with recommendations.  Palliative medicine team consulted to assist with complex medical decision making.  # Complex medical decision making/goals of care  -Extensive conversation with patient and her daughters as documented in detail above in HPI.  Spent extensive time reviewing patient's multiple medical conditions and how there is no "fix" to her medical conditions.  Expressed time should now focus on quality time with family.  Patient acknowledged her goal would be to die at home comfortably with her family.  Family and patient agreeing to return home with hospice once safely coordinated to have DME delivered.  -  Code Status: DNR  Prognosis: weeks  # Symptom management  -As per PCCM   -Agree with continuing abx and high dose steroids to alleviate symptoms to allow time at home with family at the end of life.   # Psycho-social/Spiritual Support:  - Support System: 3 daughters  # Discharge Planning:  Home with Hospice via [facility]  -Only requesting [facility] hospice in case patient needs to [facility] for end of life symptom management.  All is for patient to remain in her home with hospice support until she dies.  TOC involved to assist with care  coordination.  Thank you for allowing the palliative care team to participate in the care [facility].  Alvester Morin, DO Palliative Care Provider PMT # [phone]  If patient remains symptomatic despite  maximum doses, please call PMT at [phone] between 0700 and 1900. Outside of these hours, please call attending, as PMT does not have night coverage.  This provider spent a total of 95 minutes providing patient's care.  Includes review of EMR, discussing care with other staff members involved in patient's medical care, obtaining relevant history and information from patient and/or patient's family, and personal review of imaging and lab work. Greater than 50% of the time was spent counseling and coordinating care related to the above assessment and plan.    *Please note that this is a verbal dictation therefore any spelling or grammatical errors are due to the "Dragon Medical One" system interpretation.

## 2022-08-15 DIAGNOSIS — R509 Fever, unspecified: Secondary | ICD-10-CM | POA: Diagnosis not present

## 2022-08-15 DIAGNOSIS — Z170 Estrogen receptor positive status [ER+]: Secondary | ICD-10-CM

## 2022-08-15 DIAGNOSIS — Z515 Encounter for palliative care: Secondary | ICD-10-CM | POA: Diagnosis not present

## 2022-08-15 DIAGNOSIS — C50212 Malignant neoplasm of upper-inner quadrant of left female breast: Secondary | ICD-10-CM | POA: Diagnosis not present

## 2022-08-15 DIAGNOSIS — Z711 Person with feared health complaint in whom no diagnosis is made: Secondary | ICD-10-CM | POA: Diagnosis not present

## 2022-08-15 DIAGNOSIS — R5081 Fever presenting with conditions classified elsewhere: Secondary | ICD-10-CM | POA: Diagnosis not present

## 2022-08-15 DIAGNOSIS — D709 Neutropenia, unspecified: Secondary | ICD-10-CM | POA: Diagnosis not present

## 2022-08-15 LAB — CBC WITH DIFFERENTIAL/PLATELET
Abs Immature Granulocytes: 0.09 10*3/uL — ABNORMAL HIGH (ref 0.00–0.07)
Basophils Absolute: 0 10*3/uL (ref 0.0–0.1)
Basophils Relative: 2 %
Eosinophils Absolute: 0 10*3/uL (ref 0.0–0.5)
Eosinophils Relative: 0 %
HCT: 24 % — ABNORMAL LOW (ref 36.0–46.0)
Hemoglobin: 7.9 g/dL — ABNORMAL LOW (ref 12.0–15.0)
Immature Granulocytes: 9 %
Lymphocytes Relative: 24 %
Lymphs Abs: 0.3 10*3/uL — ABNORMAL LOW (ref 0.7–4.0)
MCH: 29.3 pg (ref 26.0–34.0)
MCHC: 32.9 g/dL (ref 30.0–36.0)
MCV: 88.9 fL (ref 80.0–100.0)
Monocytes Absolute: 0.6 10*3/uL (ref 0.1–1.0)
Monocytes Relative: 56 %
Neutro Abs: 0.1 10*3/uL — CL (ref 1.7–7.7)
Neutrophils Relative %: 9 %
Platelets: 13 10*3/uL — CL (ref 150–400)
RBC: 2.7 MIL/uL — ABNORMAL LOW (ref 3.87–5.11)
RDW: 14.8 % (ref 11.5–15.5)
WBC: 1.1 10*3/uL — CL (ref 4.0–10.5)
nRBC: 6.6 % — ABNORMAL HIGH (ref 0.0–0.2)

## 2022-08-15 LAB — BASIC METABOLIC PANEL
Anion gap: 9 (ref 5–15)
BUN: 45 mg/dL — ABNORMAL HIGH (ref 8–23)
CO2: 27 mmol/L (ref 22–32)
Calcium: 7.6 mg/dL — ABNORMAL LOW (ref 8.9–10.3)
Chloride: 97 mmol/L — ABNORMAL LOW (ref 98–111)
Creatinine, Ser: 0.56 mg/dL (ref 0.44–1.00)
GFR, Estimated: >60 mL/min (ref >60)
Glucose, Bld: 284 mg/dL — ABNORMAL HIGH (ref 70–99)
Potassium: 4.7 mmol/L (ref 3.5–5.1)
Sodium: 133 mmol/L — ABNORMAL LOW (ref 135–145)

## 2022-08-15 LAB — BPAM PLATELET PHERESIS
ISSUE DATE / TIME: 202407200923
Unit Type and Rh: 5100

## 2022-08-15 LAB — PREPARE PLATELET PHERESIS: Unit division: 0

## 2022-08-15 LAB — GLUCOSE, CAPILLARY
Glucose-Capillary: 199 mg/dL — ABNORMAL HIGH (ref 70–99)
Glucose-Capillary: 347 mg/dL — ABNORMAL HIGH (ref 70–99)
Glucose-Capillary: 238 mg/dL — ABNORMAL HIGH (ref 70–99)

## 2022-08-15 LAB — SURGICAL PATHOLOGY

## 2022-08-15 MED ORDER — TRAZODONE HCL 50 MG PO TABS: 25.0000 mg | ORAL_TABLET | Freq: Every evening | ORAL | 0 refills | Status: DC | PRN

## 2022-08-15 MED ORDER — INSULIN ASPART 100 UNIT/ML IJ SOLN: 6.0000 [IU] | Freq: Three times a day (TID) | INTRAMUSCULAR | Status: DC

## 2022-08-15 MED ORDER — ONDANSETRON HCL 8 MG PO TABS
8.0000 mg | ORAL_TABLET | Freq: Three times a day (TID) | ORAL | 0 refills | Status: DC | PRN
Start: 2022-08-15 — End: 2022-09-01

## 2022-08-15 MED ORDER — PREDNISONE 10 MG PO TABS: ORAL_TABLET | ORAL | 0 refills | Status: DC

## 2022-08-15 MED ORDER — INSULIN GLARGINE-YFGN 100 UNIT/ML ~~LOC~~ SOLN
20.0000 [IU] | Freq: Two times a day (BID) | SUBCUTANEOUS | Status: DC
  Administered 2022-08-15: 20 [IU] via SUBCUTANEOUS
  Filled 2022-08-15 (×2): qty 0.2

## 2022-08-15 MED ORDER — LORAZEPAM 1 MG PO TABS: 1.0000 mg | ORAL_TABLET | Freq: Three times a day (TID) | ORAL | 0 refills | Status: DC | PRN

## 2022-08-15 MED ORDER — OXYCODONE HCL 5 MG PO TABS: 5.0000 mg | ORAL_TABLET | ORAL | 0 refills | Status: DC | PRN

## 2022-08-15 NOTE — Consult Note (Signed)
   [facility] CM Inpatient Consult   08/15/2022  Amanda Duarte July 16, 1955 [id number]  LLOS: Previously active with [facility] prior to admission  Follow up:  [facility] Liaison remote coverage review for patient admitted to [facility]    Collaboration note: Updated Community RN Care Coordinator on behalf of [facility] of patient's planned disposition home with hospice at current review.  For questions,  Charlesetta Shanks, RN BSN CCM [facility]  [phone] business mobile phone Toll free office [phone]  *Concierge Line  [phone] Fax number: [phone] [name]

## 2022-08-15 NOTE — Progress Notes (Signed)
Daily Progress Note   Patient Name: Amanda Duarte       Date: 08/15/2022 DOB: Jan 14, 1956  Age: 67 y.o. MRN#: [id number] Attending Physician: Leatha Gilding, MD Primary Care Physician: Claiborne Rigg, NP Admit Date: 07/29/2022 Length of Stay: 17 days  Reason for Consultation/Follow-up: Establishing goals of care  Subjective:   CC: Patient denies any symptom concerns at this time. Following up regarding complex medical decision making.   Subjective:  EMR prior to presenting to bedside.  Patient has not required any as needed oxycodone.  Patient continuing on steroids for symptom management.  Discussed care with bedside RN for updates.  Informed plan is for patient to transport home with hospice later this afternoon once equipment delivered by [facility] hospice this morning.  Presented to bedside to check on patient.  Female family member sleeping patient. Patient knowledge plan to go home today with hospice.  Patient denied any symptom concerns at this time.  Patient looking forward to getting home.  All questions answered at that time.  Wished patient safe travels home and quality time with her family.  Thanked patient for allowing me to visit with her today.  Review of Systems No concerns Objective:   Vital Signs:  BP (!) 144/61   Pulse 71   Temp (!) 97.3 F (36.3 C) (Oral)   Resp (!) 22   Ht 5\' 1"  (1.549 m)   Wt 52.9 kg   SpO2 100%   BMI 22.04 kg/m   Physical Exam: General: NAD, in bed, on high flow nasal cannula, chronically ill-appearing, frail Eyes: no drainage noted HENT: On high flow nasal cannula Cardiovascular: RRR Respiratory: Slightly increased work of breathing noted, on high flow nasal cannula, cough appreciated multiple times during visit Skin: no rashes or lesions on visible skin Neuro: Awake, interactive, answering questions appropriately as able  Imaging:  I personally reviewed recent imaging.   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  Since admission patient has received management in ICU.  The CCM consulted to perform bronchoscopy.  Patient has remained on broad-spectrum antibiotics due to respiratory status.  Bone marrow biopsy obtained 7/19 to rule out acute leukemia as complication of prior chemotherapy.  Oncology, Dr. Mosetta Putt consulted assist with recommendations.  Palliative medicine team consulted to assist with complex medical decision making.   Recommendations/Plan: # Complex medical decision making/goals of care:  -Plan is for patient to transfer home later today with home hospice via [facility] to focus on symptom management and quality time with family at the end of life. TIOC assisting with discharge coordination.   -  Code Status: DNR Prognosis: weeks  # Symptom management:   -Agree with continuing abx and high dose steroids to alleviate symptoms to allow time at home with family at the end of life.    # Psycho-social/Spiritual Support:  - Support System: 3 daughters   # Discharge Planning:  Home with Hospice via [facility]  Discussed with: patient, RN, hospitalist, TOC, ACC liaison   Thank you for allowing the palliative care team to participate in the care [employer].  Alvester Morin, DO Palliative Care Provider PMT # [phone]  If patient remains symptomatic despite maximum doses, please call PMT at [phone] between 0700 and 1900. Outside  of these hours, please call attending, as PMT does not have night coverage.  *Please note that this is a verbal dictation therefore any spelling or grammatical errors are due to the "[employer] One" system interpretation.

## 2022-08-15 NOTE — Progress Notes (Signed)
   08/15/22 1242  AVS Discharge Documentation  AVS Discharge Instructions Including Medications Provided to patient/caregiver  Home Medications/TOC prescriptions returned from Pharmacy  Name of person receiving meds and/or prescriptions? Amanda Duarte via [employer]  Name of Person Receiving AVS Discharge Instructions Including Medications Amanda Duarte  Name of Clinician That Reviewed AVS Discharge Instructions Including Medications Odis Luster, RN   Patient's clothing, home med Amanda Duarte) returned to patient from [employer]	[place], and cell phone and Consulting civil engineer were sent home.  Daughter Amanda Duarte was phoned and given discharged instructions.  [facility] transported patient to home and daughter Amanda Duarte was called when they left.  Odis Luster, RN

## 2022-08-15 NOTE — Discharge Summary (Addendum)
Physician Discharge Summary  Amanda Duarte:[id number] DOB: 10-Apr-1955 DOA: 07/29/2022  PCP: Claiborne Rigg, NP  Admit date: 07/29/2022 Discharge date: 08/15/2022  Admitted From: home Disposition:  home with hospice  Recommendations for Outpatient Follow-up:  Follow-up with hospice services at home  Discharge Condition: guarded CODE STATUS: DNR Diet Orders (From admission, onward)     Start     Ordered   08/12/22 2117  Diet Carb Modified Fluid consistency: Thin; Room service appropriate? Yes  Diet effective now       Question Answer Comment  Diet-HS Snack? Nothing   Calorie Level Medium 1600-2000   Fluid consistency: Thin   Room service appropriate? Yes      08/12/22 2116            HPI: Per admitting MD, Amanda Duarte is a 67 y.o. female with medical history significant for treated breast cancer, insulin-dependent type 2 diabetes, myelodysplastic syndrome being admitted to the hospital with neutropenic fever.  She presented to oncology clinic with Dr. Mosetta Putt for routine follow-up, complaint of sore fever but no other symptoms, is currently on azacitadine injection.  Tells me that she was feeling fine until last night, when she had a little bit of a sore fever, and overnight she also felt like she was febrile.  Today in the oncology clinic she was found to have fever 100.4.  Lab work was obtained, showed platelets 14,000 was given 1 unit platelet transfusion in the oncology office and then sent to [facility] ED for evaluation.   Hospital Course / Discharge diagnoses: Principal Problem:   Neutropenic fever (HCC) Active Problems:   Multifocal pneumonia   Pleural effusion on left   Pleural effusion   Palliative care encounter   Febrile illness   DNR (do not resuscitate)   Counseling and coordination of care   Concern about end of life   Goals of care, counseling/discussion   Need for emotional support   Leukopenia   Significant recent events: October 2023- she had azacitidine  injection.  She was admitted to hospital after chemo, for severe profound pancytopenia, aspiration pneumonia, required intubation.   January 2024-started on 2 cycles of reduced azacitidine   She was also evaluted at [facility] for possible bone marrow transplant. There was concern for disease patient given worsening cytopenias. 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 June, 2024 - hospitalized, thought to be a periorbital colitis and potential ostiomeatal sinusitis based on imaging. She was given broad-spectrum bacterial therapy form of vancomycin cefepime and metronidazole.  She was seen by ophthalmology and per recommendation, patient was transferred to [facility].  Over there, she was seen by ophthalmology and ENT.  Per family's account, at [facility], she had an IND of occluded tear duct, no growth in the culture.  She was discharged from the hospital in about 3 days.  Outpatient follow-up showed patent tear ducts. Back to her chemotherapy twice a week was recently held due to worsening cytopenia.  She was referred to leukemia specialist Dr. Sharyne Richters. In the interim she developed fevers though without other focal symptoms.   - On 7/5, she was seen by oncology and found to be febrile to 100.4.  Her platelets dropped to 14,000.  She was given a platelet transfusion.  Chest x-ray showed some peribronchial thickening, and patient was sent to the ED and was admitted.  She was started on broad-spectrum antibiotics, however despite coverage she remained febrile - 7/9, ID  was consulted - 7/9, CT of the orbits showed resolved right orbital cellulitis and unresolved paranasal sinus inflammation now more bilateral and symmetric - 7/9, CT chest showed multifocal airspace disease with tree-in-bud and nodular opacities throughout all lobes of both lungs. - 7/11, with worsening fever, PCCM was consulted to assist bronchoscopy. - 7/12, underwent  bronchoscopy.  Per report, copious amount of light green secretions were noted in the left lower lobe bronchus,.  Suctioned out.  No endobronchial lesions seen.  BAL subsequently resulted negative for malignant cells, negative culture, negative for pneumocystis, Aspergillus, AFB smear - 7/17, CT chest showed extensive infiltrates in both lungs right more than left, no significant pleural effusion.  Hilar and mediastinal lymphadenopathy suggestive of reactive hyperplasia -7/19, bone marrow biopsy to rule out acute leukemia as a complication of prior chemotherapy -7/20, worsening respiratory status, PCCM consulted, started on steroids, also DNR   Subjective / 24h Interval events: Feeling a little bit better today.  No longer had to use the BiPAP last night   Assesement and Plan: Principal Problem:   Neutropenic fever (HCC) Active Problems:   Multifocal pneumonia   Pleural effusion on left   Pleural effusion   Principal problem Neutropenic fever, sepsis Due to Multifocal Pneumonia- ID following, currently on meropenem. Most recent CT scan of the chest on 7/17 with extensive bilateral infiltrates, worsened since prior imaging.  Despite aggressive maximal therapy patient is not improving, after goals of care discussions she will be transition to home with hospice.  Active problems Pancytopenia, underlying MDS - requiring multiple transfusions, total of 9 units of platelets and 2 of pRBC so far.   Acute respiratory failure with hypoxia - Due to multifocal pneumonia, not responding to antibiotics.  Echo 7/13 showed EF 50 to 55%, no WMA  Hypertension Acute Metabolic Encephalopathy, Elevated ammonia level - Mental status back to normal  UTI - Urine culture grew E. coli, pansensitive Hyponatremia - Stable Type 2 diabetes mellitus uncontrolled with hyperglycemia - A1c 11.9 in June 2024.  Continue home medications, now home on hospice   Discharge Instructions   Allergies as of 08/15/2022   No  Known Allergies      Medication List     STOP taking these medications    atorvastatin 40 MG tablet Commonly known as: LIPITOR   ceFEPIme 2 g in sodium chloride 0.9 % 100 mL   fenofibrate 145 MG tablet Commonly known as: TRICOR   metroNIDAZOLE 500 MG tablet Commonly known as: FLAGYL   prochlorperazine 10 MG tablet Commonly known as: COMPAZINE   vancomycin 750 MG/150ML Soln Commonly known as: VANCOREADY       TAKE these medications    acetaminophen 325 MG tablet Commonly known as: TYLENOL Take 650 mg by mouth every 4 (four) hours as needed for moderate pain.   amLODipine 5 MG tablet Commonly known as: NORVASC Take 1 tablet (5 mg total) by mouth daily. For blood pressure   Basaglar KwikPen 100 UNIT/ML Inject 20 Units into the skin 2 (two) times daily.   Blood Pressure Monitor Devi Please provide patient with insurance approved blood pressure monitor   carboxymethylcellulose 0.5 % Soln Commonly known as: REFRESH PLUS Place 1 drop into the right eye 4 (four) times daily.   citalopram 20 MG tablet Commonly known as: CELEXA Take 1 tablet (20 mg total) by mouth daily. For depression   Cresemba 186 MG Caps Generic drug: Isavuconazonium Sulfate Take 2 capsules (372 mg total) by mouth every 8 (eight) hours for  2 days, THEN 2 capsules (372 mg total) daily for 12 days. Start taking on: August 11, 2022   erythromycin ophthalmic ointment Place 1 Application into the right eye 2 (two) times daily.   fluticasone 50 MCG/ACT nasal spray Commonly known as: FLONASE Place 2 sprays into both nostrils daily.   FreeStyle Libre 2 Sensor Misc Monitor blood glucose levels 5-6 times per day E11.65   FreeStyle Libre Reader Devi Monitor blood glucose levels 5-6 times per day E11.65   glucose blood test strip Use to check blood sugar three times daily. E11.65   HumaLOG KwikPen 100 UNIT/ML KwikPen Generic drug: insulin lispro Inject 10 Units into the skin 3 (three) times  daily.   Insulin Pen Needle 31G X 5 MM Misc Inject 1 Device into the skin QID. For use with insulin pens   Insulin Syringe-Needle U-100 30G X 5/16" 1 ML Misc Use as directed to inject into the skin 3 times daily.   levofloxacin 500 MG tablet Commonly known as: Levaquin Tome 1 tableta (500 mg en total) por va oral diariamente. (Take 1 tablet (500 mg total) by mouth daily.)   LORazepam 1 MG tablet Commonly known as: Ativan Take 1 tablet (1 mg total) by mouth every 8 (eight) hours as needed for anxiety.   losartan 50 MG tablet Commonly known as: COZAAR Take 50 mg by mouth daily.   ondansetron 8 MG tablet Commonly known as: Zofran Take 1 tablet (8 mg total) by mouth every 8 (eight) hours as needed for nausea or vomiting.   OneTouch Delica Lancets 33G Misc Use to check blood sugar three times daily. E11.65   OneTouch Verio w/Device Kit Use to check blood sugar three times daily. E11.65   oxyCODONE 5 MG immediate release tablet Commonly known as: Oxy IR/ROXICODONE Take 1 tablet (5 mg total) by mouth every 4 (four) hours as needed for severe pain. Take 0.5 to 1 tablet (2.5-5 mg total) by mouth every 4 (four) hours as needed for up to 5 days   pantoprazole 40 MG tablet Commonly known as: PROTONIX Take 1 tablet (40 mg total) by mouth daily at 6 (six) AM. For heartburn What changed:  when to take this additional instructions   potassium chloride SA 20 MEQ tablet Commonly known as: KLOR-CON M Tome una tableta (20 mEq en total) por va oral diariamente. (Take 1 tablet (20 mEq total) by mouth daily.)   predniSONE 10 MG tablet Commonly known as: DELTASONE Take 4 tablets (40 mg total) by mouth daily for 5 days, THEN 3 tablets (30 mg total) daily for 5 days, THEN 2 tablets (20 mg total) daily for 5 days, THEN 1 tablet (10 mg total) daily for 5 days. Start taking on: August 15, 2022   sitaGLIPtin-metformin 50-1000 MG tablet Commonly known as: JANUMET Take 1 tablet by mouth 2 (two)  times daily with a meal.   traZODone 50 MG tablet Commonly known as: DESYREL Take 0.5 tablets (25 mg total) by mouth at bedtime as needed for sleep.         Consultations: Oncology Palliative PCCM ID  Procedures/Studies: none  DG Chest Port 1 View  Result Date: 08/13/2022 CLINICAL DATA:  Respiratory complication with dyspnea EXAM: PORTABLE CHEST 1 VIEW COMPARISON:  08/09/2022 radiograph and chest CT from 3 days ago FINDINGS: Marked worsening of severe bilateral airspace disease compared to radiograph but similar to chest CT comparison. Pleural fluid is possible at the left base. No pneumothorax. Stable mediastinal contours. No cardiomegaly. IMPRESSION:  Extensive airspace disease similar to CT from 3 days ago. Electronically Signed   By: Tiburcio Pea M.D.   On: 08/13/2022 04:10   CT BONE MARROW BIOPSY  Result Date: 08/12/2022 INDICATION: 67 year old female with history of myelodysplastic syndrome. EXAM: CT-GUIDED BONE MARROW BIOPSY AND ASPIRATION MEDICATIONS: None ANESTHESIA/SEDATION: Fentanyl 50 mcg IV; Versed 1 mg IV Sedation Time: 10 minutes; The patient was continuously monitored during the procedure by the interventional radiology nurse under my direct supervision. COMPLICATIONS: None immediate. PROCEDURE: Informed consent was obtained from the patient following an explanation of the procedure, risks, benefits and alternatives. The patient understands, agrees and consents for the procedure. All questions were addressed. A time out was performed prior to the initiation of the procedure. The patient was positioned prone and non-contrast localization CT was performed of the pelvis to demonstrate the iliac marrow spaces. The operative site was prepped and draped in the usual sterile fashion. Under sterile conditions and local anesthesia, a 22 gauge spinal needle was utilized for procedural planning. Next, an 11 gauge coaxial bone biopsy needle was advanced into the right iliac marrow  space. Needle position was confirmed with CT imaging. Initially, a bone marrow aspiration was performed. Next, a bone marrow biopsy was obtained with the 11 gauge outer bone marrow device. Samples were prepared with the cytotechnologist and deemed adequate. The needle was removed and superficial hemostasis was obtained with manual compression. A dressing was applied. The patient tolerated the procedure well without immediate post procedural complication. IMPRESSION: Successful CT guided right iliac bone marrow aspiration and core biopsy. Marliss Coots, MD [facility] Radiology Electronically Signed   By: Marliss Coots M.D.   On: 08/12/2022 11:35   CT CHEST W CONTRAST  Result Date: 08/10/2022 CLINICAL DATA:  Pneumonia, fever EXAM: CT CHEST WITH CONTRAST TECHNIQUE: Multidetector CT imaging of the chest was performed during intravenous contrast administration. RADIATION DOSE REDUCTION: This exam was performed according to the departmental dose-optimization program which includes automated exposure control, adjustment of the mA and/or kV according to patient size and/or use of iterative reconstruction technique. CONTRAST:  75mL OMNIPAQUE IOHEXOL 300 MG/ML  SOLN COMPARISON:  Previous studies including CT done on 08/02/2022 and chest radiograph done today FINDINGS: Cardiovascular: There are no intraluminal filling defects in central pulmonary artery branches. Evaluation of peripheral branches is limited by motion artifacts and infiltrates. There is homogeneous enhancement in thoracic aorta. Coronary artery calcifications are seen. Mediastinum/Nodes: There are enlarged lymph nodes in mediastinum and both hilar regions. Largest of the nodes is in the subcarinal region measuring 2.1 x 1.4 cm. Lungs/Pleura: Extensive patchy alveolar infiltrates are seen in both lungs, more so on the right side. There is significant interval worsening of infiltrates in both lungs. There is  almost complete opacification in both lower lobes. There is no pneumothorax. There is no significant pleural effusion. Upper Abdomen: No acute findings are seen. Musculoskeletal: No acute findings are seen. IMPRESSION: Extensive infiltrates are seen in both lungs, more so on the right side with significant interval worsening. Findings suggest worsening multifocal pneumonia. There is no significant pleural effusion. There is no pneumothorax. There are enlarged lymph nodes in both hilar regions and mediastinum, possibly suggesting reactive hyperplasia. Electronically Signed   By: Ernie Avena M.D.   On: 08/10/2022 15:39   DG CHEST PORT 1 VIEW  Result Date: 08/09/2022 CLINICAL DATA:  Dyspnea EXAM: PORTABLE CHEST 1 VIEW COMPARISON:  08/09/2022 FINDINGS: Bibasilar airspace infiltrates with retrocardiac opacification appears stable, likely infectious  or inflammatory in nature. Small left pleural effusion is unchanged. No pneumothorax. Cardiac size within normal limits. No acute bone abnormality. Surgical clips noted within the left axilla. IMPRESSION: 1. Stable bibasilar airspace infiltrates with small left pleural effusion. Electronically Signed   By: Helyn Numbers M.D.   On: 08/09/2022 21:03   DG Chest Port 1 View  Result Date: 08/09/2022 CLINICAL DATA:  Left pleural effusion and pneumonia EXAM: PORTABLE CHEST 1 VIEW COMPARISON:  Chest x-ray dated August 07, 2022 FINDINGS: Rightward patient rotation somewhat limits evaluation. Cardiac and mediastinal contours are unchanged. Similar heterogeneous airspace opacities. Similar hazy density of the lower left hemithorax, possibly due to layering pleural effusion. No evidence of pneumothorax. IMPRESSION: 1. Similar heterogeneous airspace opacities, concerning for infection. 2. Hazy density of the lower left hemithorax, possibly due to layering pleural effusion. Electronically Signed   By: Allegra Lai M.D.   On: 08/09/2022 08:57   DG CHEST PORT 1  VIEW  Result Date: 08/07/2022 CLINICAL DATA:  Shortness of breath EXAM: PORTABLE CHEST 1 VIEW COMPARISON:  08/05/2022 FINDINGS: Heart mediastinal contours are within normal limits. Patchy bilateral airspace disease, similar to prior study. Layering left pleural effusion. No acute bony abnormality. IMPRESSION: Bilateral multifocal airspace disease compatible with pneumonia. Layering left effusion. No change. Electronically Signed   By: Charlett Nose M.D.   On: 08/07/2022 09:51   ECHOCARDIOGRAM COMPLETE  Result Date: 08/06/2022    ECHOCARDIOGRAM REPORT   Patient Name:   Amanda Duarte Date of Exam: 08/06/2022 Medical Rec #:  [id number]   Height:       61.0 in Accession #:    [id number]  Weight:       114.9 lb Date of Birth:  1955/09/24    BSA:          1.492 m Patient Age:    67 years    BP:           142/62 mmHg Patient Gender: F           HR:           79 bpm. Exam Location:  Inpatient Procedure: 2D Echo, Cardiac Doppler and Color Doppler Indications:    Other abnormalities of the heart  History:        Patient has prior history of Echocardiogram examinations, most                 recent 12/07/2021. Cancer and Stroke, Signs/Symptoms:Chest Pain                 and Fever; Risk Factors:Hypertension, Diabetes, Dyslipidemia and                 cellulitis.  Sonographer:    Wallie Char Referring Phys: [id number] A CALDWELL POWELL JR IMPRESSIONS  1. Left ventricular ejection fraction, by estimation, is 50 to 55%. The left ventricle has low normal function. The left ventricle has no regional wall motion abnormalities. Left ventricular diastolic parameters were normal.  2. Right ventricular systolic function is normal. The right ventricular size is normal.  3. Left atrial size was mildly dilated.  4. Right atrial size was mildly dilated.  5. The mitral valve is normal in structure. Mild mitral valve regurgitation. No evidence of mitral stenosis.  6. The aortic valve is normal in structure. Aortic valve regurgitation is not  visualized. No aortic stenosis is present.  7. The inferior vena cava is normal in size with greater than 50% respiratory variability, suggesting right atrial pressure  of 3 mmHg. FINDINGS  Left Ventricle: Left ventricular ejection fraction, by estimation, is 50 to 55%. The left ventricle has low normal function. The left ventricle has no regional wall motion abnormalities. The left ventricular internal cavity size was normal in size. There is no left ventricular hypertrophy. Left ventricular diastolic parameters were normal. Right Ventricle: The right ventricular size is normal. No increase in right ventricular wall thickness. Right ventricular systolic function is normal. Left Atrium: Left atrial size was mildly dilated. Right Atrium: Right atrial size was mildly dilated. Pericardium: There is no evidence of pericardial effusion. Mitral Valve: The mitral valve is normal in structure. Mild mitral valve regurgitation. No evidence of mitral valve stenosis. MV peak gradient, 4.2 mmHg. The mean mitral valve gradient is 2.0 mmHg. Tricuspid Valve: The tricuspid valve is normal in structure. Tricuspid valve regurgitation is mild . No evidence of tricuspid stenosis. Aortic Valve: The aortic valve is normal in structure. Aortic valve regurgitation is not visualized. No aortic stenosis is present. Aortic valve mean gradient measures 5.0 mmHg. Aortic valve peak gradient measures 8.4 mmHg. Aortic valve area, by VTI measures 2.11 cm. Pulmonic Valve: The pulmonic valve was normal in structure. Pulmonic valve regurgitation is trivial. No evidence of pulmonic stenosis. Aorta: The aortic root is normal in size and structure. Venous: The inferior vena cava is normal in size with greater than 50% respiratory variability, suggesting right atrial pressure of 3 mmHg. IAS/Shunts: No atrial level shunt detected by color flow Doppler.  LEFT VENTRICLE PLAX 2D LVIDd:         4.20 cm     Diastology LVIDs:         3.00 cm     LV e' medial:     9.36 cm/s LV PW:         0.90 cm     LV E/e' medial:  11.1 LV IVS:        1.00 cm     LV e' lateral:   12.90 cm/s LVOT diam:     1.90 cm     LV E/e' lateral: 8.1 LV SV:         71 LV SV Index:   48 LVOT Area:     2.84 cm  LV Volumes (MOD) LV vol d, MOD A2C: 87.7 ml LV vol d, MOD A4C: 71.0 ml LV vol s, MOD A2C: 42.1 ml LV vol s, MOD A4C: 33.6 ml LV SV MOD A2C:     45.6 ml LV SV MOD A4C:     71.0 ml LV SV MOD BP:      43.0 ml RIGHT VENTRICLE             IVC RV Basal diam:  3.50 cm     IVC diam: 2.30 cm RV S prime:     12.40 cm/s TAPSE (M-mode): 2.6 cm LEFT ATRIUM             Index        RIGHT ATRIUM           Index LA diam:        3.60 cm 2.41 cm/m   RA Area:     15.10 cm LA Vol (A2C):   48.9 ml 32.77 ml/m  RA Volume:   36.20 ml  24.26 ml/m LA Vol (A4C):   46.5 ml 31.16 ml/m LA Biplane Vol: 48.0 ml 32.17 ml/m  AORTIC VALVE AV Area (Vmax):    2.08 cm AV Area (Vmean):   2.05 cm  AV Area (VTI):     2.11 cm AV Vmax:           144.50 cm/s AV Vmean:          104.000 cm/s AV VTI:            0.336 m AV Peak Grad:      8.4 mmHg AV Mean Grad:      5.0 mmHg LVOT Vmax:         106.00 cm/s LVOT Vmean:        75.250 cm/s LVOT VTI:          0.250 m LVOT/AV VTI ratio: 0.74  AORTA Ao Root diam: 2.60 cm Ao Asc diam:  3.20 cm MITRAL VALVE                TRICUSPID VALVE MV Area (PHT): 3.72 cm     TR Peak grad:   33.4 mmHg MV Area VTI:   3.09 cm     TR Mean grad:   18.0 mmHg MV Peak grad:  4.2 mmHg     TR Vmax:        289.00 cm/s MV Mean grad:  2.0 mmHg     TR Vmean:       204.0 cm/s MV Vmax:       1.03 m/s MV Vmean:      66.4 cm/s    SHUNTS MV Decel Time: 204 msec     Systemic VTI:  0.25 m MV E velocity: 104.00 cm/s  Systemic Diam: 1.90 cm MV A velocity: 73.80 cm/s MV E/A ratio:  1.41 Arvilla Meres MD Electronically signed by Arvilla Meres MD Signature Date/Time: 08/06/2022/11:05:50 AM    Final    DG CHEST PORT 1 VIEW  Result Date: 08/05/2022 CLINICAL DATA:  [id number] Multifocal pneumonia [id number]. EXAM: PORTABLE CHEST  1 VIEW COMPARISON:  07/05/2022. FINDINGS: Increasing hazy opacities in the left mid and lower lung, as well as the right upper lobe, consistent with multifocal pneumonia. Stable cardiac and mediastinal contours. Possible small left parapneumonic effusion. No pneumothorax. IMPRESSION: Increasing multifocal pneumonia, left greater than right. Possible small left parapneumonic effusion. Electronically Signed   By: Orvan Falconer M.D.   On: 08/05/2022 15:21   DG CHEST PORT 1 VIEW  Result Date: 08/04/2022 CLINICAL DATA:  67 year old female with history of hypertension, cancer and diabetes presenting with signs and symptoms of sepsis. EXAM: PORTABLE CHEST 1 VIEW COMPARISON:  Chest x-ray 07/29/2022. FINDINGS: Patchy ill-defined opacities are more apparent than the prior chest x-ray scattered throughout the lungs bilaterally, most evident in the left lung base, right upper lobe and medial aspect of the right lung base, concerning for progressive multilobar bilateral bronchopneumonia. No definite pleural effusions. No pneumothorax. No evidence of pulmonary edema. Heart size is normal. Upper mediastinal contours are within normal limits. Atherosclerotic calcifications in the thoracic aorta. Surgical clips in the left axilla from prior lymph node dissection. IMPRESSION: 1. Worsening multilobar bilateral bronchopneumonia, as above. 2. Aortic atherosclerosis. Electronically Signed   By: Trudie Reed M.D.   On: 08/04/2022 06:42   CT CHEST ABDOMEN PELVIS W CONTRAST  Result Date: 08/02/2022 CLINICAL DATA:  Sepsis. EXAM: CT CHEST, ABDOMEN, AND PELVIS WITH CONTRAST TECHNIQUE: Multidetector CT imaging of the chest, abdomen and pelvis was performed following the standard protocol during bolus administration of intravenous contrast. RADIATION DOSE REDUCTION: This exam was performed according to the departmental dose-optimization program which includes automated exposure control, adjustment of the mA and/or kV according to  patient  size and/or use of iterative reconstruction technique. CONTRAST:  OMNIPAQUE IOHEXOL 300 MG/ML  SOLN COMPARISON:  Chest abdomen pelvis CT 12/05/2021, chest radiograph 07/29/2022 FINDINGS: CT CHEST FINDINGS Cardiovascular: Normal heart size. No pericardial effusion. There are coronary artery calcifications aortic atherosclerosis without acute aortic findings. Mediastinum/Nodes: Shotty mediastinal nodes, including the 12 mm subcarinal lymph node. Patulous distal esophagus. No thyroid nodule. Lungs/Pleura: Multifocal airspace disease. There are tree-in-bud and nodular opacities throughout all lobes of both lungs. More confluent areas of consolidation involving the left lower lobe where there is also septal thickening. No significant pleural effusion. No tracheal or endobronchial debris. Musculoskeletal: There are no acute or suspicious osseous abnormalities. CT ABDOMEN PELVIS FINDINGS Hepatobiliary: The left lobe of the liver is enlarged. There is no focal hepatic abnormality. Gallbladder physiologically distended, no calcified stone. No biliary dilatation. Pancreas: No ductal dilatation or inflammation. Spleen: Enlarged, 14 cm AP dimension.  No focal splenic abnormality. Adrenals/Urinary Tract: Normal adrenal glands. No hydronephrosis or perinephric edema. Homogeneous renal enhancement with symmetric excretion on delayed phase imaging. There is excreted IV contrast in both renal collecting systems in the urinary bladder. Urinary bladder is nondistended. Stomach/Bowel: No bowel obstruction or inflammation. Moderate volume of colonic stool. Normal appendix visualized. The stomach is nondistended. Vascular/Lymphatic: Aortic atherosclerosis. No aneurysm. Patent portal vein. No bulky abdominopelvic adenopathy. Reproductive: Uterus and bilateral adnexa are unremarkable. Other: No ascites. No abdominopelvic collection. There multiple subcutaneous densities in the anterior abdominal wall, typical of medication  injection sites. Musculoskeletal: There are no acute or suspicious osseous abnormalities. IMPRESSION: 1. Multifocal airspace disease with tree-in-bud and nodular opacities throughout all lobes of both lungs. More confluent areas of consolidation involving the left lower lobe where there is also septal thickening. Findings are most consistent with multifocal pneumonia. 2. No acute abnormality in the abdomen/pelvis. 3. Mild splenomegaly. Aortic Atherosclerosis (ICD10-I70.0). Electronically Signed   By: Narda Rutherford M.D.   On: 08/02/2022 22:02   CT ORBITS W CONTRAST  Result Date: 08/02/2022 CLINICAL DATA:  67 year old female with breast cancer, fever and neutropenia. Right orbital cellulitis. EXAM: CT ORBITS WITH CONTRAST TECHNIQUE: Multidetector CT images was performed according to the standard protocol following intravenous contrast administration. RADIATION DOSE REDUCTION: This exam was performed according to the departmental dose-optimization program which includes automated exposure control, adjustment of the mA and/or kV according to patient size and/or use of iterative reconstruction technique. CONTRAST:  75mL OMNIPAQUE IOHEXOL 300 MG/ML  SOLN COMPARISON:  Orbit CT 07/12/2022. FINDINGS: Orbits: Right orbits soft tissue swelling and stranding appears resolved since last month. Globes appear symmetric and intact. Periorbital soft tissues now appears symmetric. Postseptal orbits soft tissues now appears symmetric and within normal limits. Visible paranasal sinuses: Paranasal sinus mucosal thickening and occasional sinus opacification is now more symmetric and bilateral. But no discrete sinus fluid levels or bubbly opacity are identified. In the tympanic cavities and mastoids remain well aerated. Soft tissues: Negative visible pharynx, parapharyngeal spaces, retropharyngeal space, masticator and parotid spaces. Retro maxillary soft tissues also appear symmetric and normal. Osseous: No acute or suspicious  osseous lesion identified. Limited intracranial: Negative. In the major vascular structures in the visible face and at the skull base are enhancing and appear to be patent. Calcified atherosclerosis at the skull base. IMPRESSION: 1. Essentially resolved right orbital cellulitis since last month. Negative CT appearance of the orbits now. 2. Unresolved paranasal sinus inflammation, now more bilateral and symmetric, but no complicating features are identified. Electronically Signed   By: Althea Grimmer.D.  On: 08/02/2022 08:32   DG Chest Port 1 View  Result Date: 07/29/2022 CLINICAL DATA:  Weakness EXAM: PORTABLE CHEST 1 VIEW COMPARISON:  X-ray 02/03/2022 FINDINGS: Underinflation. No consolidation, pneumothorax or effusion. Mild peribronchial thickening. No edema. Surgical clips in the left axillary region. IMPRESSION: Underinflation.  Mild peribronchial thickening. Electronically Signed   By: Karen Kays M.D.   On: 07/29/2022 15:08     Subjective: No complaints  Discharge Exam: BP (!) 158/53   Pulse 82   Temp 97.8 F (36.6 C) (Oral)   Resp (!) 25   Ht 5\' 1"  (1.549 m)   Wt 52.1 kg   SpO2 98%   BMI 21.70 kg/m   General: Pt is alert, awake, not in acute distress Cardiovascular: RRR, S1/S2 +, no rubs, no gallops Respiratory: CTA bilaterally, no wheezing, no rhonchi Abdominal: Soft, NT, ND, bowel sounds + Extremities: no edema, no cyanosis    The results of significant diagnostics from this hospitalization (including imaging, microbiology, ancillary and laboratory) are listed below for reference.     Microbiology: Recent Results (from the past 240 hour(s))  MRSA Next Gen by PCR, Nasal     Status: None   Collection Time: 08/05/22  7:07 PM   Specimen: Nasal Mucosa; Nasal Swab  Result Value Ref Range Status   MRSA by PCR Next Gen NOT DETECTED NOT DETECTED Final    Comment: (NOTE) The GeneXpert MRSA Assay (FDA approved for NASAL specimens only), is one component of a comprehensive MRSA  colonization surveillance program. It is not intended to diagnose MRSA infection nor to guide or monitor treatment for MRSA infections. Test performance is not FDA approved in patients less than 60 years old. Performed at [facility], [address], [place], [place] [address]   Blastomyces Antigen     Status: None   Collection Time: 08/06/22  3:16 AM   Specimen: Blood  Result Value Ref Range Status   Blastomyces Antigen None Detected None Detected ng/mL Final    Comment: (NOTE) Reference Interval: None Detected Reportable Range: 0.31 ng/mL - 20.00 ng/mL Results above 20.00 ng/mL are reported as 'Positive, Above the Limit of Quantification' This test was developed and its performance characteristics determined by [employer]. It has not been cleared or approved by the FDA; however, FDA clearance or approval is not currently required for clinical use. The results are not intended to be used as the sole means for clinical diagnosis or patient decisions.    Specimen Type SERUM  Final    Comment: (NOTE) Performed At: [facility] [address] [place], [place] [id number] Phylis Bougie MD CZ:[id number]   Fungus culture, blood     Status: None (Preliminary result)   Collection Time: 08/09/22  4:14 PM   Specimen: BLOOD  Result Value Ref Range Status   Specimen Description   Final    BLOOD SITE NOT SPECIFIED Performed at [facility], [address] [place], [place] [address]    Special Requests   Final    BOTTLES DRAWN AEROBIC ONLY Blood Culture results may not be optimal due to an inadequate volume of blood received in culture bottles Performed at [facility], [address] [place], [place] [address]    Culture   Final    NO GROWTH 5 DAYS Performed at [facility], [address], [place], [place] [address]    Report Status PENDING  Incomplete     Labs: Basic Metabolic  Panel: Recent Labs  Lab 08/09/22 0550 08/10/22 0307 08/11/22 0319 08/12/22 0343 08/13/22 0253 08/14/22 0255 08/15/22 0247  NA 130* 130* 132* 130* 131* 134* 133*  K 4.2 3.4* 3.4* 3.8 4.1 4.9 4.7  CL 95* 95* 94* 96* 95* 97* 97*  CO2 26 25 29 28 27 26 27   GLUCOSE 105* 95 83 88 254* 340* 284*  BUN 9 8 8 11 12  30* 45*  CREATININE 0.39* 0.35* 0.35* 0.39* 0.42* 0.57 0.56  CALCIUM 7.6* 7.3* 7.2* 7.6* 7.5* 7.9* 7.6*  MG 1.8 1.6* 1.4*  --  1.7 2.2  --   PHOS  --   --  2.7  --   --   --   --    Liver Function Tests: Recent Labs  Lab 08/10/22 0307 08/13/22 0253 08/14/22 0255  AST 24 20 20   ALT 14 13 13   ALKPHOS 78 97 83  BILITOT 0.9 1.1 0.9  PROT 6.4* 7.0 7.2  ALBUMIN 1.8* 1.6* 1.7*   CBC: Recent Labs  Lab 08/10/22 0307 08/11/22 0738 08/12/22 0343 08/13/22 0253 08/14/22 0255 08/15/22 0247  WBC 0.9* 0.8* 1.1* 1.0* <0.1* 1.1*  NEUTROABS 0.1* 0.1* 0.1* 0.2*  --  0.1*  HGB 8.2* 8.6* 7.8* 8.1* 7.9* 7.9*  HCT 24.3* 25.9* 22.6* 24.6* 24.6* 24.0*  MCV 94.2 89.9 92.2 89.8 89.8 88.9  PLT 6* 28* 19* 11* 23* 13*   CBG: Recent Labs  Lab 08/14/22 1237 08/14/22 1606 08/14/22 2022 08/15/22 0735 08/15/22 1121  GLUCAP 372* 200* 199* 347* 238*   Hgb A1c No results for input(s): "HGBA1C" in the last 72 hours. Lipid Profile No results for input(s): "CHOL", "HDL", "LDLCALC", "TRIG", "CHOLHDL", "LDLDIRECT" in the last 72 hours. Thyroid function studies No results for input(s): "TSH", "T4TOTAL", "T3FREE", "THYROIDAB" in the last 72 hours.  Invalid input(s): "FREET3" Urinalysis    Component Value Date/Time   COLORURINE AMBER (A) 07/29/2022 1037   APPEARANCEUR TURBID (A) 07/29/2022 1037   LABSPEC 1.021 07/29/2022 1037   PHURINE 5.0 07/29/2022 1037   GLUCOSEU >=500 (A) 07/29/2022 1037   HGBUR MODERATE (A) 07/29/2022 1037   BILIRUBINUR NEGATIVE 07/29/2022 1037   BILIRUBINUR negative 02/15/2021 1330   KETONESUR NEGATIVE 07/29/2022 1037   PROTEINUR 30 (A) 07/29/2022 1037    UROBILINOGEN 0.2 02/15/2021 1330   UROBILINOGEN 1.0 04/04/2016 2002   NITRITE NEGATIVE 07/29/2022 1037   LEUKOCYTESUR NEGATIVE 07/29/2022 1037    FURTHER DISCHARGE INSTRUCTIONS:   Get Medicines reviewed and adjusted: Please take all your medications with you for your next visit with your Primary MD   Laboratory/radiological data: Please request your Primary MD to go over all hospital tests and procedure/radiological results at the follow up, please ask your Primary MD to get all Hospital records sent to his/her office.   In some cases, they will be blood work, cultures and biopsy results pending at the time of your discharge. Please request that your primary care M.D. goes through all the records of your hospital data and follows up on these results.   Also Note the following: If you experience worsening of your admission symptoms, develop shortness of breath, life threatening emergency, suicidal or homicidal thoughts you must seek medical attention immediately by calling 911 or calling your MD immediately  if symptoms less severe.   You must read complete instructions/literature along with all the possible adverse reactions/side effects for all the Medicines you take and that have been prescribed to you. Take any new Medicines after you have completely understood and  accpet all the possible adverse reactions/side effects.    Do not drive when taking Pain medications or sleeping medications (Benzodaizepines)   Do not take more than prescribed Pain, Sleep and Anxiety Medications. It is not advisable to combine anxiety,sleep and pain medications without talking with your primary care practitioner   Special Instructions: If you have smoked or chewed Tobacco  in the last 2 yrs please stop smoking, stop any regular Alcohol  and or any Recreational drug use.   Wear Seat belts while driving.   Please note: You were cared for by a hospitalist during your hospital stay. Once you are discharged,  your primary care physician will handle any further medical issues. Please note that NO REFILLS for any discharge medications will be authorized once you are discharged, as it is imperative that you return to your primary care physician (or establish a relationship with a primary care physician if you do not have one) for your post hospital discharge needs so that they can reassess your need for medications and monitor your lab values.  Time coordinating discharge: 35 minutes  SIGNED:  Pamella Pert, MD, PhD 08/15/2022, 12:19 PM

## 2022-08-15 NOTE — TOC Transition Note (Signed)
Transition of Care [facility]) - CM/SW Discharge Note   Patient Details  Name: Amanda Duarte MRN: [id number] Date of Birth: 03-03-1955  Transition of Care [facility]) CM/SW Contact:  Armanda Heritage, RN Phone Number: 08/15/2022, 10:07 AM   Clinical Narrative:    Patient to discharge home with hospice once medical equipment is in the home.  Equipment delivery anticipated between 11am and 1 pm.  Patient to transport by EMS.   Final next level of care: Home/Self Care Barriers to Discharge: Continued Medical Work up   Patient Goals and CMS Choice CMS Medicare.gov Compare Post Acute Care list provided to:: Patient Represenative (must comment) [name] (daughter)) Choice offered to / list presented to : Adult Children  Discharge Placement                         Discharge Plan and Services Additional resources added to the After Visit Summary for   In-house Referral: NA Discharge Planning Services: CM Consult Post Acute Care Choice: Home Health          DME Arranged: Walker rolling                    Social Determinants of Health (SDOH) Interventions SDOH Screenings   Food Insecurity: Food Insecurity Present (07/29/2022)  Housing: Medium Risk (07/29/2022)  Transportation Needs: No Transportation Needs (07/29/2022)  Utilities: At Risk (07/29/2022)  Alcohol Screen: Low Risk  (03/17/2022)  Depression (PHQ2-9): Low Risk  (05/16/2022)  Financial Resource Strain: Medium Risk (03/17/2022)  Physical Activity: Insufficiently Active (03/17/2022)  Social Connections: Moderately Isolated (03/17/2022)  Stress: No Stress Concern Present (03/17/2022)  Tobacco Use: Low Risk  (08/09/2022)     Readmission Risk Interventions    08/12/2022    5:02 PM 08/03/2022    1:20 PM 07/11/2022    2:50 PM  Readmission Risk Prevention Plan  Transportation Screening Complete Complete Complete  PCP or Specialist Appt within 3-5 Days  Complete Complete  HRI or Home Care Consult  Complete Complete  Social  Work Consult for Recovery Care Planning/Counseling  Complete Complete  Palliative Care Screening  Not Applicable Not Applicable  Medication Review [employer]) Complete Complete Complete  PCP or Specialist appointment within 3-5 days of discharge Complete    HRI or Home Care Consult Complete    SW Recovery Care/Counseling Consult Complete    Palliative Care Screening Not Applicable    Skilled Nursing Facility Not Applicable

## 2022-08-16 ENCOUNTER — Telehealth: Payer: Self-pay | Admitting: *Deleted

## 2022-08-16 LAB — SURGICAL PATHOLOGY

## 2022-08-16 LAB — FUNGUS CULTURE, BLOOD

## 2022-08-16 NOTE — Patient Outreach (Signed)
Care Coordination   Follow Up Visit Note   08/16/2022 Name: Amanda Duarte MRN: [id number] DOB: [birth year]  Amanda Duarte is a 67 y.o. year old female who sees Claiborne Rigg, NP for primary care. I spoke with Amanda Duarte, daughter of Amanda Duarte by phone today.  What matters to the patients health and wellness today?  Continue with hospice resources.  Amanda Duarte confirms initial home visit was completed today.  Reminded to have SW involvement for resources for patient's disabled grandson, she verbalizes understanding.     Goals Addressed               This Visit's Progress     COMPLETED: Additional Community Resources and support        [facility] NP talked with pt today 04/26/22 and she has not received a call from the [facility] that she knows of. NP forwarded the message below to pt's Oncology NP to request their LCSW to assist further with obtaining these services.  Activities and task to complete in order to accomplish goals.   Follow up on food resources discussed  [employer], [address] (in the building behind the main building) In Person Shopping: Wednesday 12pm-7pm, Friday 11am-2pm, and Saturday 9am-12pm  Disability  [employer] [address]  [place], [place] [address] [phone] / [phone]   [email or web address]     Per your request I placed a referral today for someone from the [facility] to call you [address] [place], [place] [address]    [phone]   [email or web address]     7/23 - additional resources provided by [facility]      COMPLETED: I will continue to receive Cancer treatments (pt-stated)        Care Coordination Interventions: Assessed patient understanding of cancer diagnosis and recommended treatment plan Reviewed upcoming provider appointments and treatment appointments Assessed available transportation to appointments and treatments. Has consistent/reliable transportation:  No Assessed support system. Has consistent/reliable family or other support: Yes Referred to licensed clinical social work for SDOH screening and transportation   7/23 - now on hospice      COMPLETED: Patient will report she is getting adequate food to support herself and her grandson in the next month.   On track       Interventions Today    Flowsheet Row Most Recent Value  Chronic Disease   Chronic disease during today's visit Other  [cancer]  General Interventions   General Interventions Discussed/Reviewed General Interventions Reviewed, [employer] now with hospice, [facility]]                 COMPLETED: Patient will verbalize she is getting the emotional support she needs over the next 3 months.                COMPLETED: To keep blood sugars stable while taking Prednisone (pt-stated)        04/26/22 Pt is not taking prednisone, however, her chemotherapy DOES cause hyperglycemia. Pt is on an insulin regimen which she does follow.  Care Coordination Interventions: Provided education to patient about basic DM disease process Reviewed medications with patient and discussed importance of medication adherence Provided patient with written educational materials related to hypo and hyperglycemia and importance of correct treatment Advised patient, providing education and rationale, to check cbg before meals and at bedtime and record, calling PCP for findings outside established parameters Review of patient status, including review of consultants reports, relevant laboratory  and other test results, and medications completed            SDOH assessments and interventions completed:  No     Care Coordination Interventions:  Yes, provided   Follow up plan: No further intervention required.   Encounter Outcome:  Pt. Visit Completed   Kemper Durie, RN, MSN, [facility] Care Management Care Management Coordinator [phone]

## 2022-08-17 ENCOUNTER — Telehealth: Payer: Self-pay | Admitting: Hematology

## 2022-08-17 ENCOUNTER — Telehealth: Payer: Self-pay | Admitting: Internal Medicine

## 2022-08-17 ENCOUNTER — Other Ambulatory Visit: Payer: Self-pay | Admitting: Family Medicine

## 2022-08-17 LAB — FUNGUS CULTURE, BLOOD: Culture: NO GROWTH

## 2022-08-17 MED ORDER — FIASP FLEXTOUCH 100 UNIT/ML ~~LOC~~ SOPN: 10.0000 [IU] | PEN_INJECTOR | Freq: Three times a day (TID) | SUBCUTANEOUS | 1 refills | Status: DC

## 2022-08-17 NOTE — Telephone Encounter (Signed)
I called patient's daughter Elane Fritz, and discussed her bone marrow biopsy from August 12, 2022.  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  Unfortunately due to her persistent fever and respiratory failure, she is not a candidate for induction chemotherapy or other treatment for AML.  She was sent home with home hospice.  I fully support that decision, she is doing okay at home, I answered all her questions.   Malachy Mood MD  08/17/2022

## 2022-08-17 NOTE — Telephone Encounter (Signed)
-----   Message from Malachy Mood sent at 08/17/2022  6:44 AM EDT ----- Thanks for letting me know, I will call her daughter and let her know  Terrace Arabia ----- Message ----- From: Marcine Matar, MD Sent: 08/16/2022   6:20 PM EDT To: Malachy Mood, MD  I am covering for Bertram Denver one of our nurse practitioners who is on vacation this week.  I received the pathology results on this patient showing acute myeloid leukemia.  I wanted to make sure that you were aware of this and has a copy of the report.

## 2022-08-18 ENCOUNTER — Other Ambulatory Visit: Payer: Self-pay | Admitting: Family Medicine

## 2022-08-18 MED ORDER — INSULIN LISPRO (1 UNIT DIAL) 100 UNIT/ML (KWIKPEN): 10.0000 [IU] | PEN_INJECTOR | Freq: Three times a day (TID) | SUBCUTANEOUS | 2 refills | Status: DC

## 2022-08-19 ENCOUNTER — Encounter (HOSPITAL_COMMUNITY): Payer: Self-pay | Admitting: Hematology

## 2022-08-23 ENCOUNTER — Ambulatory Visit: Payer: Medicare Other | Admitting: Nurse Practitioner

## 2022-08-25 DEATH — deceased

## 2022-09-25 DEATH — deceased

## 2023-10-29 IMAGING — MG MM DIGITAL SCREENING UNILAT*R* W/ TOMO W/ CAD
4 series · 4 of 12 positions shown · non-contrast
Comparison: Previous exam(s).

CLINICAL DATA: Screening.

EXAM:
DIGITAL SCREENING UNILATERAL RIGHT MAMMOGRAM WITH CAD AND
TOMOSYNTHESIS
TECHNIQUE: Right screening digital craniocaudal and mediolateral oblique
mammograms were obtained. Right screening digital breast
tomosynthesis was performed. The images were evaluated with
computer-aided detection.

[R CC synth-2D]
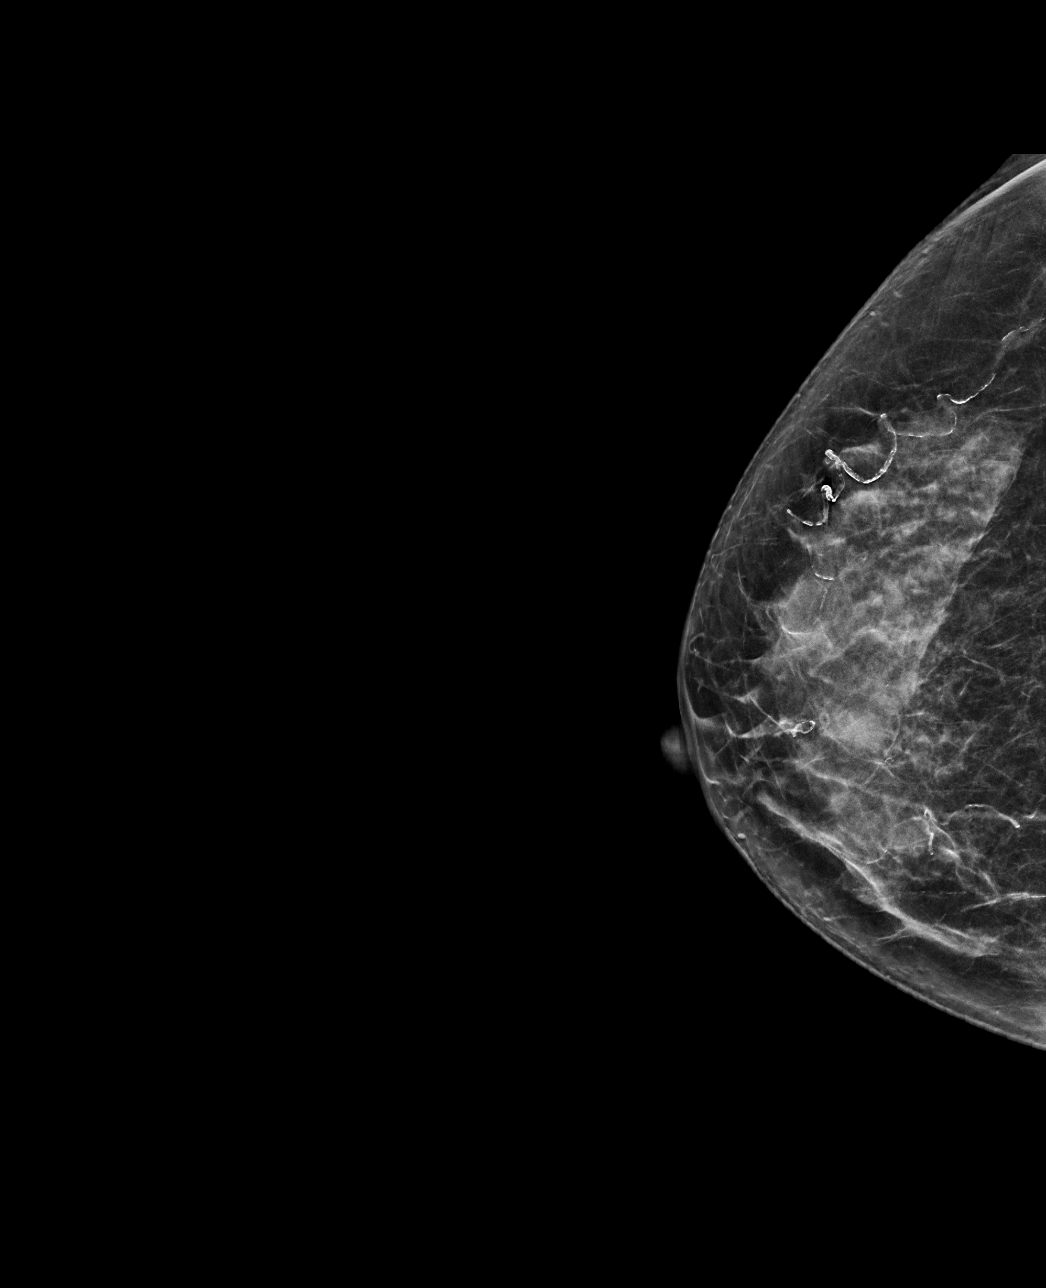

[R MLO synth-2D]
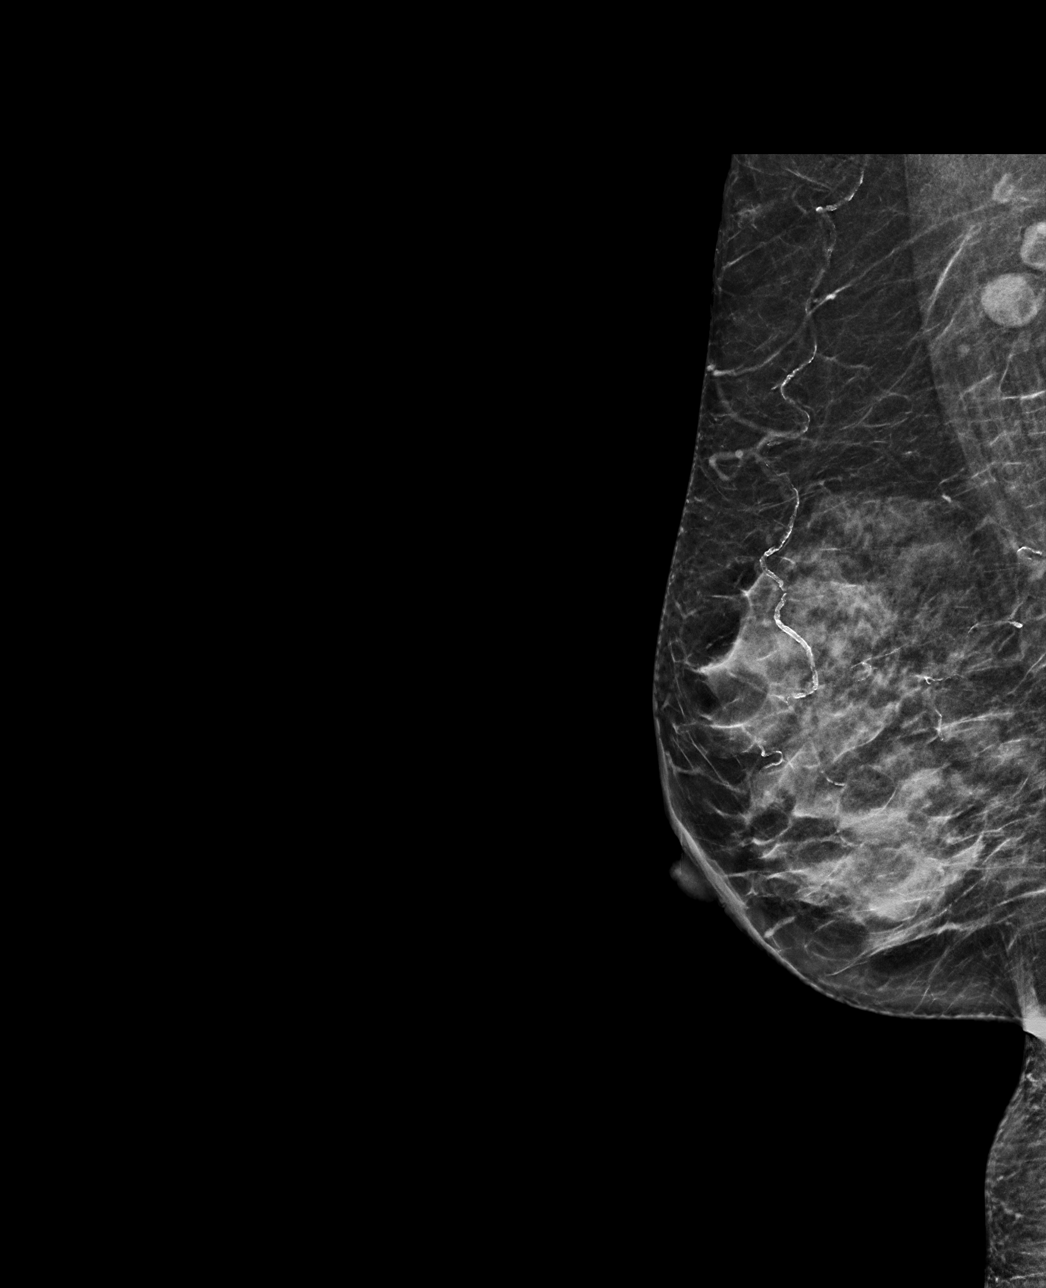

[R CC tomo · tomo slice 30/59.0]
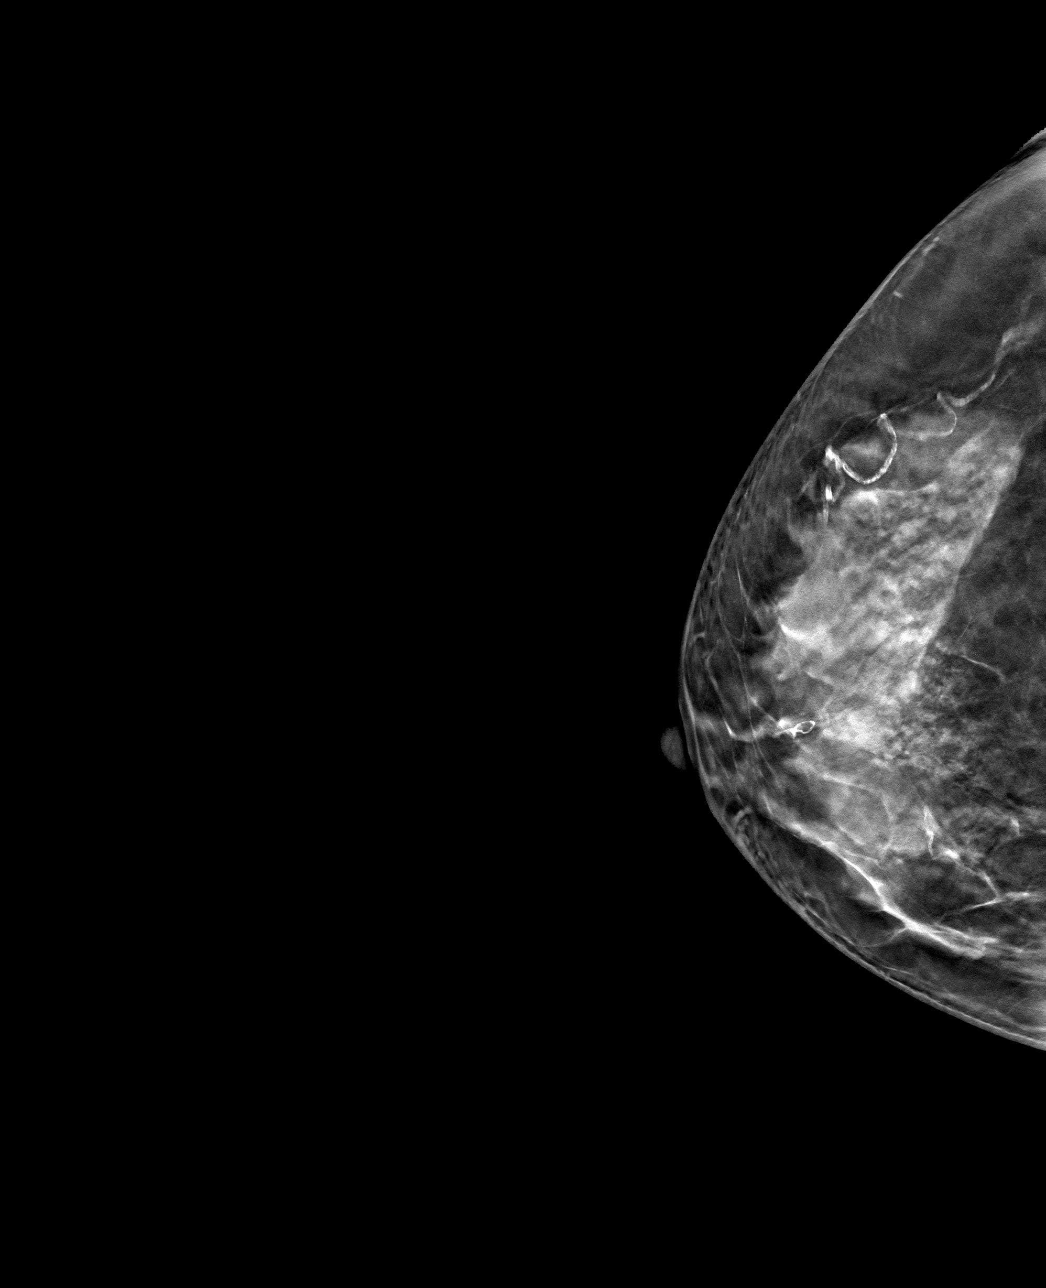

[R MLO tomo · tomo slice 30/59.0]
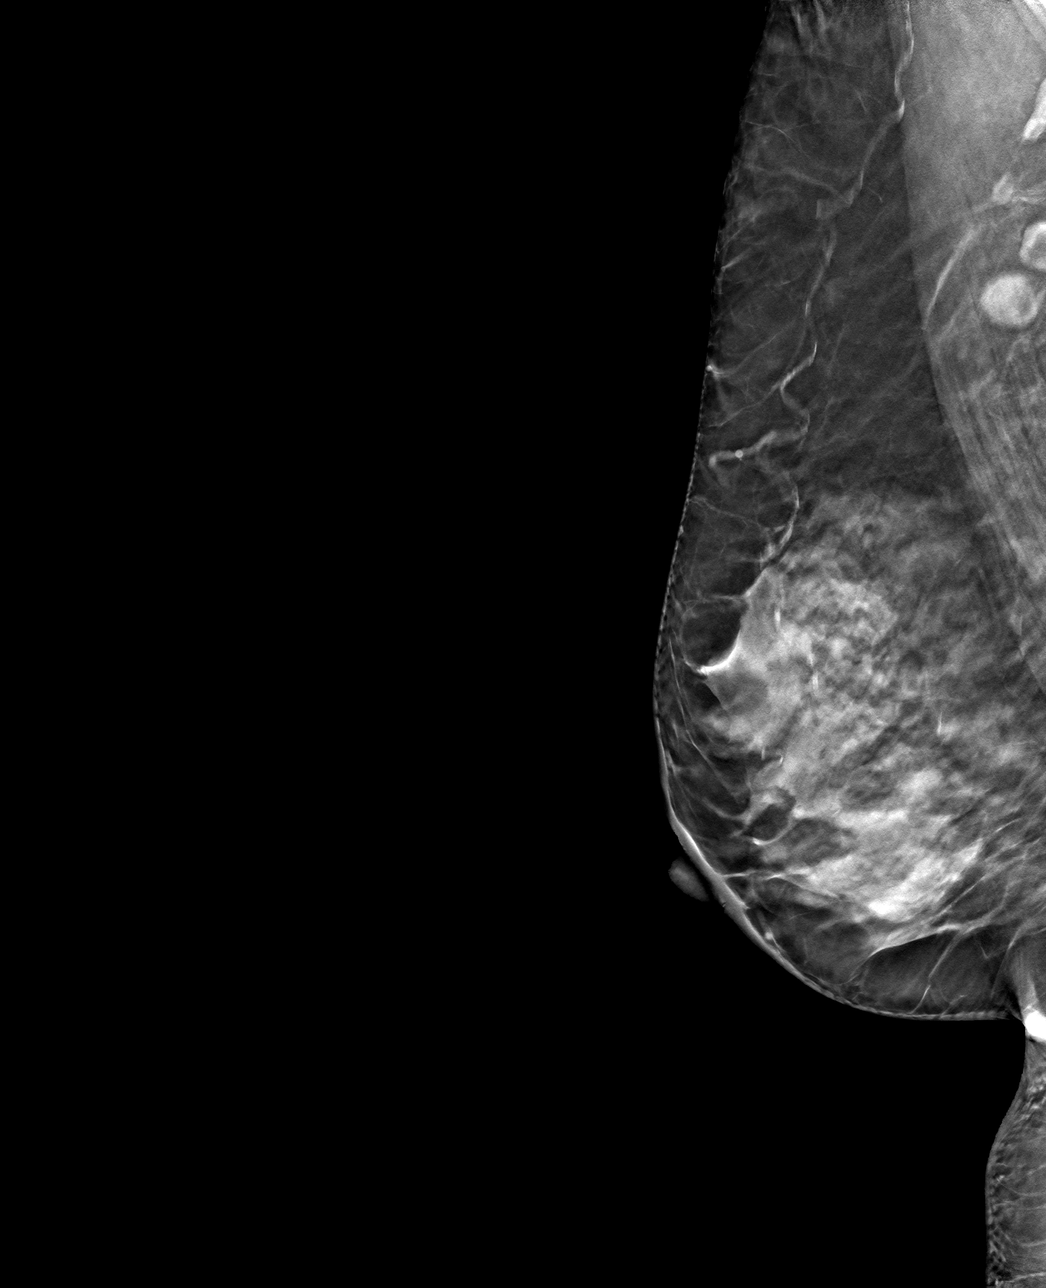

[4 of 12 positions shown; findings below may reference images not displayed]

ACR Breast Density Category c: The breast tissue is heterogeneously
dense, which may obscure small masses.
FINDINGS: In the right axilla, a possible mass warrants further evaluation. In
the left breast, no findings suspicious for malignancy.
IMPRESSION: Further evaluation is suggested for possible mass in the right
axilla.

RECOMMENDATION:
Ultrasound of the right axilla. (Code:[id number])

The patient will be contacted regarding the findings, and additional
imaging will be scheduled.

BI-RADS CATEGORY  0: Incomplete. Need additional imaging evaluation
and/or prior mammograms for comparison.

## 2024-01-01 IMAGING — US US AXILLARY RIGHT
1 series · 6 of 6 positions shown · non-contrast
Comparison: Previous exam(s).

CLINICAL DATA: The patient was called back from screening
mammography performed February 16, 2021 for possible adenopathy.
Mastectomy on the left previously.

EXAM:
ULTRASOUND OF THE RIGHT AXILLA

[Series 1: us axillary right · 0.07mm/px · 6 of 6 slices shown]
[im 1/6]
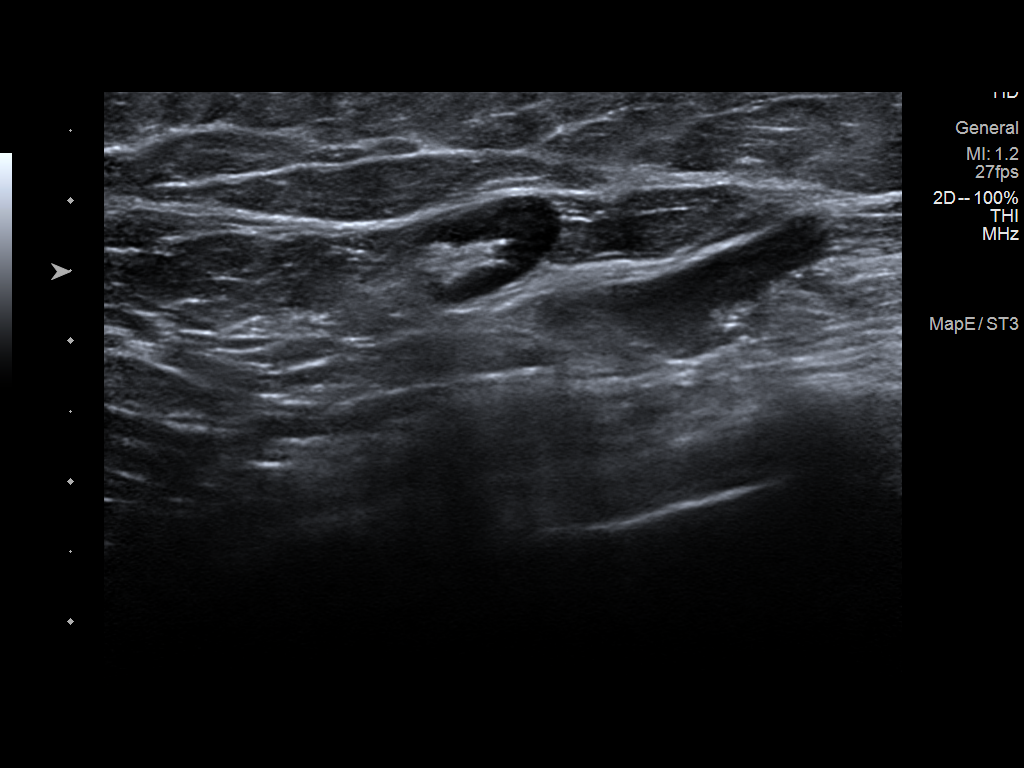
[im 2/6]
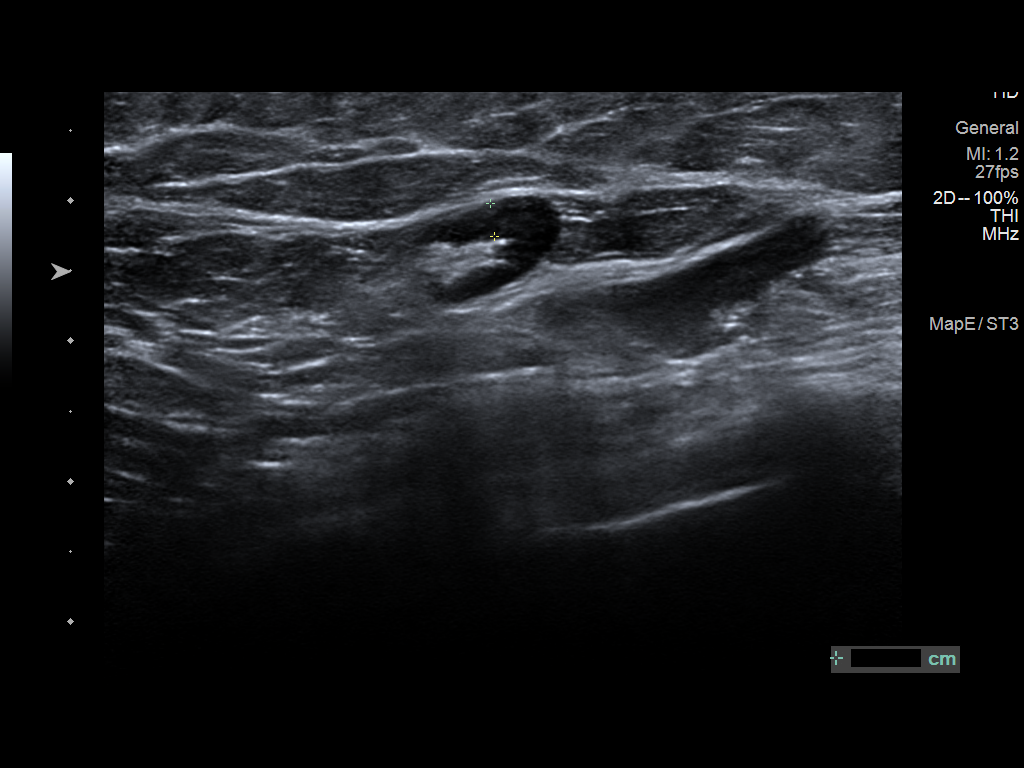
[im 3/6]
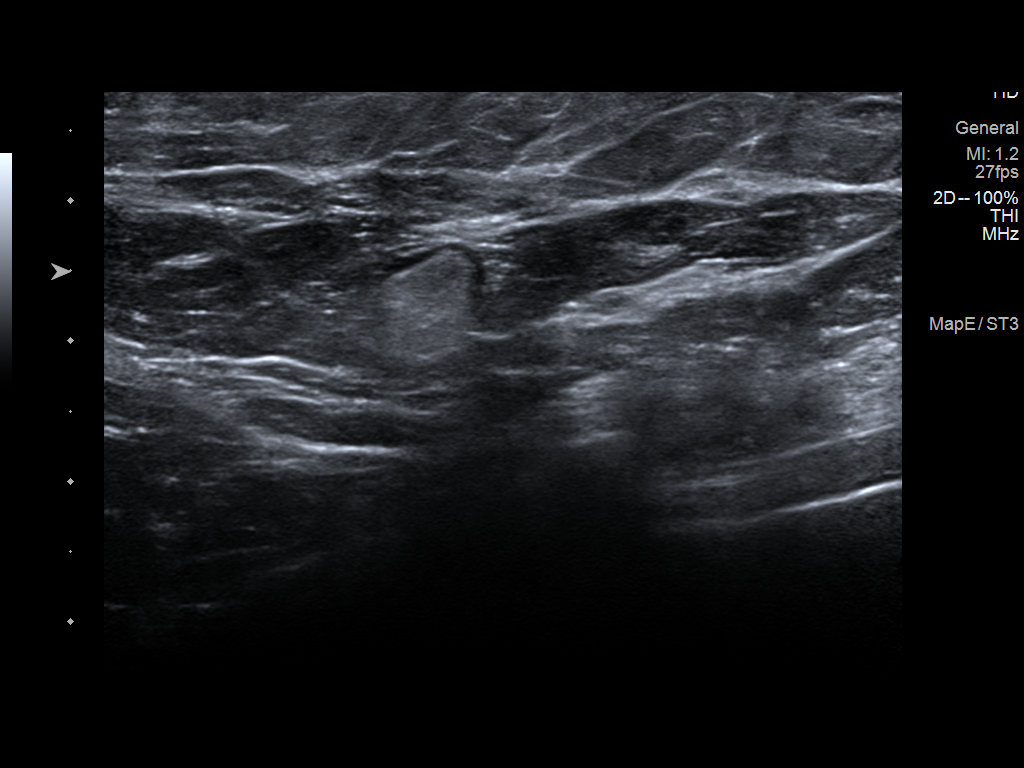
[im 4/6]
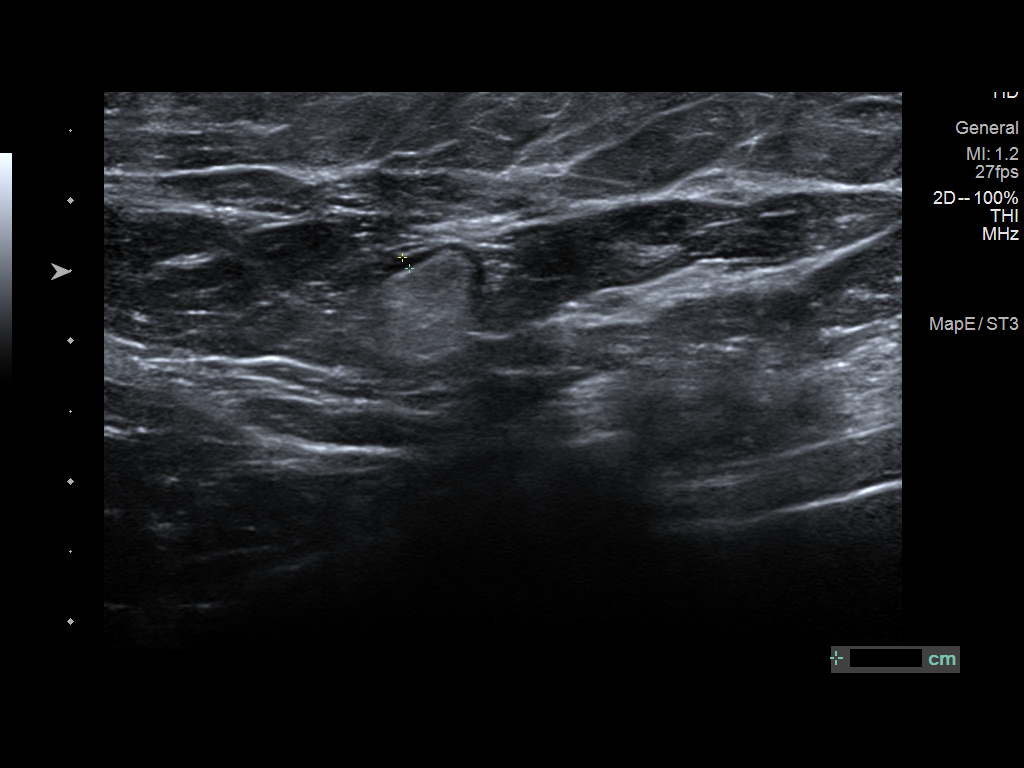
[im 5/6]
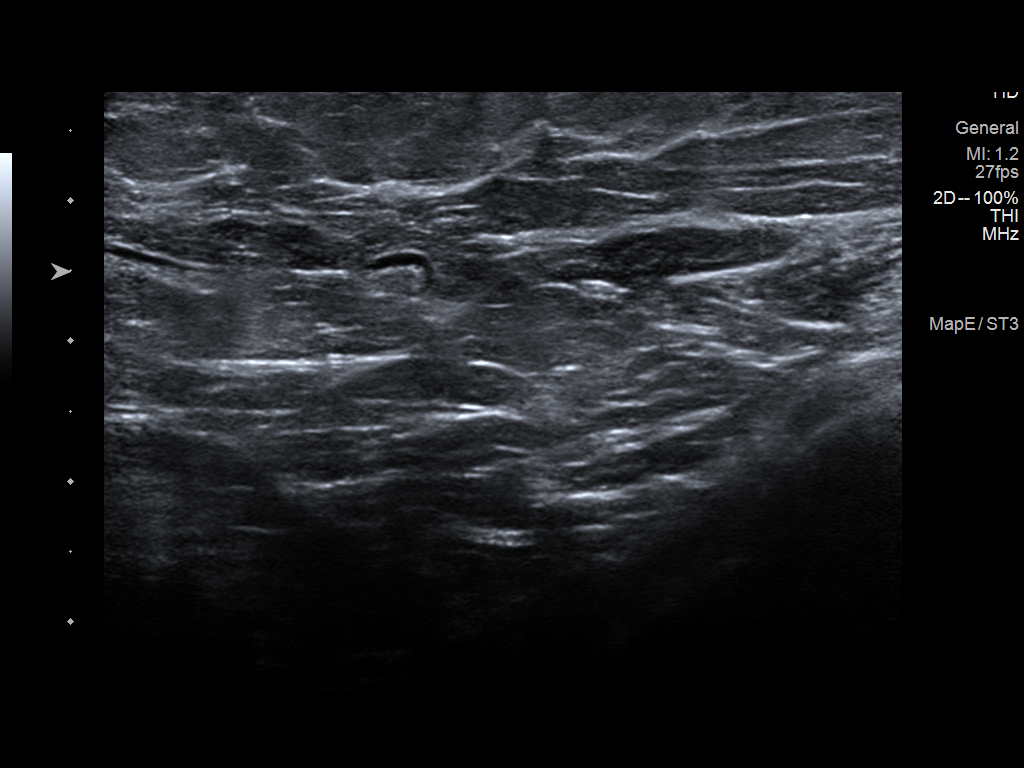
[im 6/6]
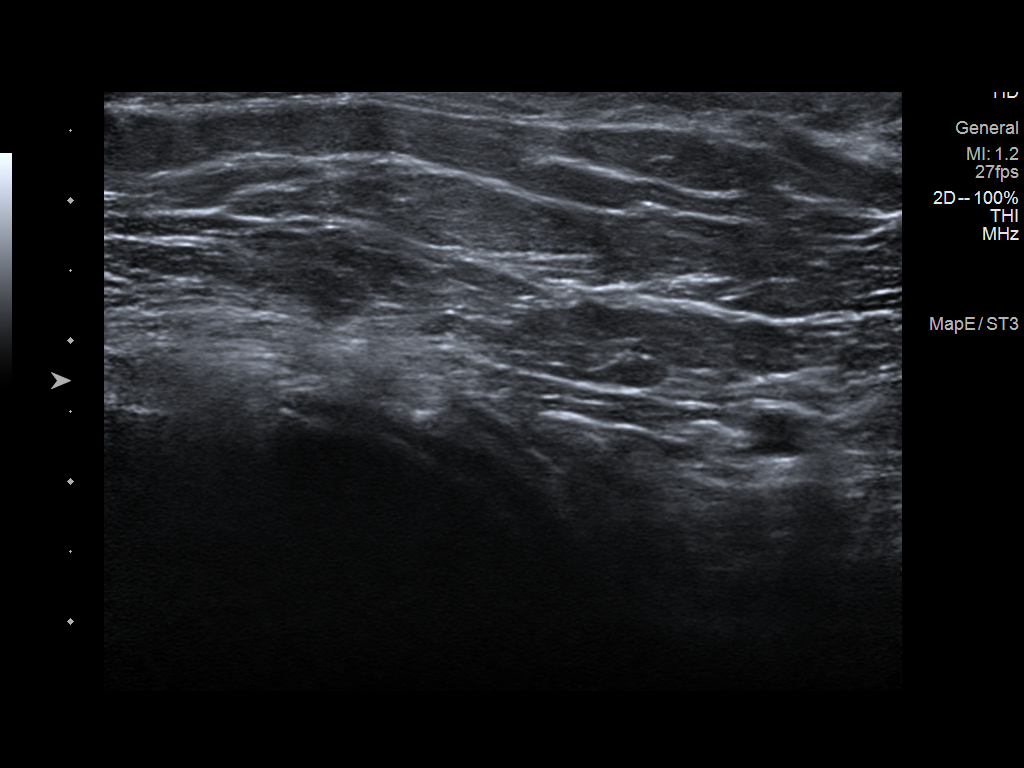

[6 of 6 positions shown; findings below may reference images not displayed]

FINDINGS: Ultrasound is performed, showing a mildly prominent but normal node
in the right axilla with a cortex measuring 2.4 mm. This node is
larger than the surrounding nodes but is normal by ultrasound
criteria.
IMPRESSION: There is mildly prominent node in the right axilla which is normal
by ultrasound criteria. This node was larger on the most recent
screening mammogram compared to more remote mammography. A reactive
node is favored.

RECOMMENDATION:
Recommend 3 month follow-up mammogram and ultrasound of the probably
benign, likely reactive, lymph node.

I have discussed the findings and recommendations with the patient.
If applicable, a reminder letter will be sent to the patient
regarding the next appointment.

BI-RADS CATEGORY  3: Probably benign.
